# Patient Record
Sex: Female | Born: 1945
Health system: Southern US, Community
[De-identification: ages and names within clinical notes are randomized; demographics above are authoritative.]

## PROBLEM LIST (undated history)

## (undated) DIAGNOSIS — Z9682 Presence of neurostimulator: Secondary | ICD-10-CM

## (undated) DIAGNOSIS — M810 Age-related osteoporosis without current pathological fracture: Secondary | ICD-10-CM

## (undated) DIAGNOSIS — G8922 Chronic post-thoracotomy pain: Secondary | ICD-10-CM

## (undated) DIAGNOSIS — F319 Bipolar disorder, unspecified: Secondary | ICD-10-CM

## (undated) DIAGNOSIS — K219 Gastro-esophageal reflux disease without esophagitis: Secondary | ICD-10-CM

## (undated) DIAGNOSIS — M549 Dorsalgia, unspecified: Secondary | ICD-10-CM

## (undated) DIAGNOSIS — I959 Hypotension, unspecified: Secondary | ICD-10-CM

## (undated) DIAGNOSIS — N189 Chronic kidney disease, unspecified: Secondary | ICD-10-CM

## (undated) DIAGNOSIS — G2581 Restless legs syndrome: Secondary | ICD-10-CM

## (undated) DIAGNOSIS — J069 Acute upper respiratory infection, unspecified: Secondary | ICD-10-CM

## (undated) DIAGNOSIS — E039 Hypothyroidism, unspecified: Secondary | ICD-10-CM

## (undated) DIAGNOSIS — L509 Urticaria, unspecified: Secondary | ICD-10-CM

## (undated) DIAGNOSIS — F1110 Opioid abuse, uncomplicated: Secondary | ICD-10-CM

## (undated) DIAGNOSIS — C3490 Malignant neoplasm of unspecified part of unspecified bronchus or lung: Secondary | ICD-10-CM

## (undated) DIAGNOSIS — G43909 Migraine, unspecified, not intractable, without status migrainosus: Secondary | ICD-10-CM

## (undated) DIAGNOSIS — N3941 Urge incontinence: Secondary | ICD-10-CM

## (undated) DIAGNOSIS — R6510 Systemic inflammatory response syndrome (SIRS) of non-infectious origin without acute organ dysfunction: Secondary | ICD-10-CM

## (undated) DIAGNOSIS — H269 Unspecified cataract: Secondary | ICD-10-CM

## (undated) DIAGNOSIS — A0472 Enterocolitis due to Clostridium difficile, not specified as recurrent: Secondary | ICD-10-CM

## (undated) DIAGNOSIS — G8929 Other chronic pain: Secondary | ICD-10-CM

## (undated) DIAGNOSIS — D803 Selective deficiency of immunoglobulin G [IgG] subclasses: Secondary | ICD-10-CM

## (undated) DIAGNOSIS — R251 Tremor, unspecified: Secondary | ICD-10-CM

## (undated) DIAGNOSIS — G039 Meningitis, unspecified: Secondary | ICD-10-CM

## (undated) DIAGNOSIS — D801 Nonfamilial hypogammaglobulinemia: Secondary | ICD-10-CM

## (undated) DIAGNOSIS — T7840XA Allergy, unspecified, initial encounter: Secondary | ICD-10-CM

## (undated) DIAGNOSIS — G4730 Sleep apnea, unspecified: Secondary | ICD-10-CM

## (undated) DIAGNOSIS — D649 Anemia, unspecified: Secondary | ICD-10-CM

## (undated) DIAGNOSIS — R413 Other amnesia: Secondary | ICD-10-CM

## (undated) DIAGNOSIS — Z5189 Encounter for other specified aftercare: Secondary | ICD-10-CM

## (undated) HISTORY — DX: Presence of neurostimulator: Z96.82

## (undated) HISTORY — DX: Hypotension, unspecified: I95.9

## (undated) HISTORY — DX: Systemic inflammatory response syndrome (sirs) of non-infectious origin without acute organ dysfunction: R65.10

## (undated) HISTORY — DX: Meningitis, unspecified: G03.9

## (undated) HISTORY — DX: Acute upper respiratory infection, unspecified: J06.9

## (undated) HISTORY — PX: SPINAL CORD STIMULATOR IMPLANT: SHX2422

## (undated) HISTORY — DX: Selective deficiency of immunoglobulin g (igg) subclasses: D80.3

## (undated) HISTORY — DX: Anemia, unspecified: D64.9

## (undated) HISTORY — DX: Enterocolitis due to Clostridium difficile, not specified as recurrent: A04.72

## (undated) HISTORY — PX: CARPAL TUNNEL RELEASE: SHX101

## (undated) HISTORY — DX: Unspecified cataract: H26.9

## (undated) HISTORY — PX: CHOLECYSTECTOMY: SHX55

## (undated) HISTORY — DX: Age-related osteoporosis without current pathological fracture: M81.0

## (undated) HISTORY — DX: Encounter for other specified aftercare: Z51.89

## (undated) HISTORY — DX: Urticaria, unspecified: L50.9

## (undated) HISTORY — PX: ABDOMINAL HYSTERECTOMY: SHX81

## (undated) HISTORY — DX: Allergy, unspecified, initial encounter: T78.40XA

## (undated) HISTORY — DX: Opioid abuse, uncomplicated: F11.10

## (undated) HISTORY — PX: MULTIPLE TOOTH EXTRACTIONS: SHX2053

## (undated) HISTORY — PX: GASTRIC BYPASS: SHX52

---

## 2010-01-24 ENCOUNTER — Ambulatory Visit: Payer: Self-pay | Admitting: Internal Medicine

## 2010-01-25 ENCOUNTER — Inpatient Hospital Stay (HOSPITAL_COMMUNITY): Admission: EM | Admit: 2010-01-25 | Discharge: 2010-01-28 | Payer: Self-pay | Admitting: Orthopedic Surgery

## 2010-01-26 ENCOUNTER — Encounter: Payer: Self-pay | Admitting: Internal Medicine

## 2010-01-26 ENCOUNTER — Encounter (INDEPENDENT_AMBULATORY_CARE_PROVIDER_SITE_OTHER): Payer: Self-pay | Admitting: Cardiovascular Disease

## 2010-01-26 ENCOUNTER — Encounter (INDEPENDENT_AMBULATORY_CARE_PROVIDER_SITE_OTHER): Payer: Self-pay | Admitting: Internal Medicine

## 2010-01-28 ENCOUNTER — Encounter (INDEPENDENT_AMBULATORY_CARE_PROVIDER_SITE_OTHER): Payer: Self-pay | Admitting: Internal Medicine

## 2010-01-28 ENCOUNTER — Encounter: Payer: Self-pay | Admitting: Pulmonary Disease

## 2010-01-31 ENCOUNTER — Ambulatory Visit: Payer: Self-pay | Admitting: Internal Medicine

## 2010-01-31 DIAGNOSIS — D803 Selective deficiency of immunoglobulin G [IgG] subclasses: Secondary | ICD-10-CM | POA: Insufficient documentation

## 2010-01-31 DIAGNOSIS — G039 Meningitis, unspecified: Secondary | ICD-10-CM | POA: Insufficient documentation

## 2010-01-31 DIAGNOSIS — C3490 Malignant neoplasm of unspecified part of unspecified bronchus or lung: Secondary | ICD-10-CM | POA: Insufficient documentation

## 2010-01-31 DIAGNOSIS — G4733 Obstructive sleep apnea (adult) (pediatric): Secondary | ICD-10-CM | POA: Insufficient documentation

## 2010-01-31 DIAGNOSIS — J310 Chronic rhinitis: Secondary | ICD-10-CM | POA: Insufficient documentation

## 2010-02-03 ENCOUNTER — Telehealth: Payer: Self-pay | Admitting: Internal Medicine

## 2010-02-04 ENCOUNTER — Telehealth: Payer: Self-pay | Admitting: Internal Medicine

## 2010-02-05 ENCOUNTER — Telehealth: Payer: Self-pay | Admitting: Internal Medicine

## 2010-02-11 ENCOUNTER — Encounter: Payer: Self-pay | Admitting: Internal Medicine

## 2010-02-12 ENCOUNTER — Encounter: Payer: Self-pay | Admitting: Internal Medicine

## 2010-02-12 DIAGNOSIS — E039 Hypothyroidism, unspecified: Secondary | ICD-10-CM | POA: Insufficient documentation

## 2010-02-14 HISTORY — PX: LUNG CANCER SURGERY: SHX702

## 2010-03-03 ENCOUNTER — Encounter: Payer: Self-pay | Admitting: Internal Medicine

## 2010-04-08 ENCOUNTER — Encounter: Payer: Self-pay | Admitting: Internal Medicine

## 2010-06-22 DIAGNOSIS — F332 Major depressive disorder, recurrent severe without psychotic features: Secondary | ICD-10-CM | POA: Insufficient documentation

## 2010-06-22 DIAGNOSIS — D801 Nonfamilial hypogammaglobulinemia: Secondary | ICD-10-CM | POA: Insufficient documentation

## 2010-09-07 ENCOUNTER — Encounter: Payer: Self-pay | Admitting: Internal Medicine

## 2010-09-16 NOTE — Consult Note (Signed)
Summary: [facility]  [place]   Imported By: Lester [place] 02/04/2010 10:16:45  _____________________________________________________________________  External Attachment:    Type:   Image     Comment:   External Document

## 2010-09-16 NOTE — Progress Notes (Signed)
Summary: speak to [facility]  Phone Note Call from Patient Call back at [facility] Phone [phone]   Caller: Patient Call For: Rosemae Mcquown Summary of Call: Wants to speak to Bjorn Loser, re: oncology referral. Initial call taken by: Darletta Moll,  February 03, 2010 8:22 AM  Follow-up for Phone Call        Bjorn Loser are you expecting a call from this pt? Zackery Barefoot CMA  February 03, 2010 9:09 AM  Tried calling pt back. Pt's phone would not accept unidentified callers, gave my name when phone started ringing it went to a busy signial. Alfonso Ramus  February 03, 2010 10:12 AM   Return pt's call and she stated that a former classmate's father(of pt's son)  is a physician at [facility] and he has recommended Dr. Rexene Alberts. Pt is requesting that Dr. Maple Hudson call a Dr. Billee Cashing [phone] and speak with him about accepting Mrs. Pang as patient.  Patient would like a follow up call from the above message. Alfonso Ramus  February 03, 2010 10:51 AM   Follow-up by: Waymon Budge MD,  February 03, 2010 1:23 PM  Additional Follow-up for Phone Call Additional follow up Details #1::        Please try to set up this referral. Additional Follow-up by: Waymon Budge MD,  February 03, 2010 1:23 PM    Additional Follow-up for Phone Call Additional follow up Details #2::    Called Levonne Hubert at [facility]. Asked her to please schedule Mrs. Shadduck with Dr. Rexene Alberts. She stated that she would be happy to schedule pt with him. Oneita Kras that Path slides and CT's Chest have been sent fed ex per her request and records faxed this am. Rodney Booze stated that she would have appt in a couple of days and would call me with appt. Called and spoke with Mrs. Mccalister and advised of the above. Instructed pt that I would be back in touch with her an appt. Rhonda Cobb  February 03, 2010 4:20 PM

## 2010-09-16 NOTE — Progress Notes (Signed)
----   Converted from flag ---- ---- 02/04/2010 2:26 PM, Alfonso Ramus wrote: Pt is scheduled to see Dr. Billee Cashing on Tues. 02/11/10 at 2:30. Pt is aware of appt.  thanks, Bjorn Loser ------------------------------

## 2010-09-16 NOTE — Progress Notes (Signed)
Summary: [facility] requesting PET before appt  Phone Note From Other Clinic Call back at [phone]   Caller: Levonne Hubert with [facility] Call For: Dr. Maple Hudson Summary of Call: [phone] Rodney Booze with [facility] is requesting our physician to order PET Scan. Their physician Dr. Billee Cashing can see the patient on Tues 02/11/10 but would like to have the PET Scan already done here.  May I have an order for a PET Scan? Please advise.  Initial call taken by: Alfonso Ramus,  February 04, 2010 2:14 PM  Follow-up for Phone Call        Rodney Booze with [facility] called back and stated that the pt didn't want to have the PET scan here, because she lives 3 hours away from here. Rodney Booze stated that they would schedule PET and have pt come down on Monday at [facility] to obtain PET. Rhonda Cobb  February 04, 2010 2:25 PM

## 2010-09-16 NOTE — Letter (Signed)
Summary: [facility]   Imported By: Sherian Rein 03/07/2010 10:28:34  _____________________________________________________________________  External Attachment:    Type:   Image     Comment:   External Document

## 2010-09-16 NOTE — Progress Notes (Signed)
Summary: diagnosis  Phone Note Call from Patient Call back at Home Phone [phone]   Caller: Patient Call For: Megan Brewer Reason for Call: Talk to Nurse Summary of Call: pt needs a copy of her diagnosis to send to her insurance company. Initial call taken by: Eugene Gavia,  February 03, 2010 8:35 AM  Follow-up for Phone Call        Spoke with pt.  She states that she is needing a letter stating her dx from [facility].  Once letter is completed, she needs this mailed to her.  Please advise, thanks! Follow-up by: [name],  February 03, 2010 12:32 PM  Additional Follow-up for Phone Call Additional follow up Details #1::        Dictated Additional Follow-up by: Waymon Budge MD,  February 03, 2010 1:22 PM     Appended Document: diagnosis Await dictation to be completed  Appended Document: diagnosis KATIE PLS HOLD FOR LETTER HAVE CY SIGN AND MAIL TO PT  Appended Document: diagnosis Letter has been signed and mailed to pt.

## 2010-09-16 NOTE — Assessment & Plan Note (Signed)
Summary: f/u biopsy per Dr. Rodney Langton   CC:  f/u biopsy per Dr. Sung Amabile.  History of Present Illness: January 31, 2010- 65 yoF never smoker recently hosp with a Right hilar Mass. Dr Sung Amabile did bronch finding adenocarcinoma. She is here to f/u now with husband and daughter. Atypical chest pain was evaluated with negative stress Cardiolite  test done because of elevated Troponin. My consult note from that hospitalization, the DC summary and bronchoscopy note are reviewed. She has not yet had staging. They live in [place] and she has been hosp twice in past year at [facility] for Migraine, so she has a file there. We discussed how best to proceed and I suggested the transportation and logistics would be better if she were managed through [facility].   Preventive Screening-Counseling & Management  Alcohol-Tobacco     Smoking Status: never  Past History:  Past Medical History: Adenocarcinoma lung-                              > bronch dx 01/28/10 IgG4 deficiency                             IV gammaglobulin through L portacath every 3 weeks Arachnoiditis- neurostimulator Osteoporosis Hx pseudomembranous colitis Rhinosinusitis Migraine Anxiety TMJ discomfort- bite guard fibromyalgia  Past Surgical History: Gastric bypass Neural stimulator for arachnoiditis Left portacath carpal tunnel  Morton's neuroma Knee arthroscopy Ankle arthroscopy Sinus surgery tubal ligation Partial hysterectomy angioplasty Cholecystectomy  Family History: empysemia.....Marland KitchenCHF.Marland KitchenMarland KitchenMarland KitchenHX of TIA....+..grandmother melanoma.......+ mother and father Father- died Parkinsons, MI  Social History: married lives with husband, Geneticist, molecular, has 2 children. Patient never smoked.  Smoking Status:  never  Review of Systems       The patient complains of shortness of breath with activity, chest pain, irregular heartbeats, acid heartburn, indigestion, weight change, sore throat, tooth/dental problems, headaches, nasal  congestion/difficulty breathing through nose, ear ache, anxiety, depression, hand/feet swelling, and change in color of mucus.  The patient denies shortness of breath at rest, productive cough, non-productive cough, coughing up blood, loss of appetite, abdominal pain, difficulty swallowing, itching, joint stiffness or pain, rash, and fever.    Vital Signs:  Patient profile:   65 year old female Height:      63 inches Weight:      150 pounds BMI:     26.67 O2 Sat:      97 % on Room air Pulse rate:   99 / minute BP sitting:   98 / 50  (left arm) Cuff size:   regular  Vitals Entered By: Denna Haggard, CMA (January 31, 2010 2:42 PM)  O2 Sat at Rest %:  97% O2 Flow:  Room air  Reason for Visit F/U biopsy per Dr. Sung Amabile  Physical Exam  Additional Exam:  General: A/Ox3; pleasant and cooperative, NAD, SKIN: no rash, lesions NODES: no lymphadenopathy HEENT: /AT, EOM- WNL, Conjuctivae- clear, PERRLA, TM-WNL, Nose- clear, Throat- clear and wnl NECK: Supple w/ fair ROM, JVD- none, normal carotid impulses w/o bruits Thyroid- normal to palpation CHEST: Clear to P&A, no wheeze, cough or dullness HEART: RRR, no m/g/r heard ABDOMEN: Soft and nl; nml bowel sounds; no organomegaly or masses noted NWG:NFAO, nl pulses, no edema  NEURO: Grossly intact to observation       Impression & Recommendations:  Problem # 1:  ADENOCARCINOMA, RIGHT LUNG (ICD-162.9) I have discussed the  diagnosis and what information we have at this point with her and her family. She lives in [place] and has had other medical care at [facility] so we will explore early availability of oncology there. If it will take too long we will get her seen here for staging and reconsider later.. I anticipate she will need PFT, PET- not yet done.  Medications Added to Medication List This Visit: 1)  Ativan 2 Mg Tabs (Lorazepam)  Other Orders: Est. Patient Level IV (14782) Oncology Referral (Oncology)  Patient  Instructions: 1)  Please schedule a follow-up appointment as needed. 2)  See  to set up an oncology referral to [facility]  Appended Document: f/u biopsy per Dr. Rodney Langton    Clinical Lists Changes  Medications: Added new medication of ASPIRIN 81 MG TBEC (ASPIRIN) take 1 by mouth once daily Added new medication of ZOLOFT 100 MG TABS (SERTRALINE HCL) take 1.5 by mouth once daily Added new medication of GEODON 60 MG CAPS (ZIPRASIDONE HCL) take 1 by mouth two times a day Added new medication of LEVOTHYROXINE SODIUM 100 MCG TABS (LEVOTHYROXINE SODIUM) take 1 by mouth once daily Added new medication of MOBIC 7.5 MG TABS (MELOXICAM) take 1 by mouth once daily as needed Added new medication of BUTISOL SODIUM 30 MG TABS (BUTABARBITAL SODIUM) take 1 by mouth once daily as needed (up to 2.5 once daily) Added new medication of VITAMIN D 1000 UNIT TABS (CHOLECALCIFEROL) take 1 by mouth once daily Added new medication of POTASSIUM CHLORIDE CR 10 MEQ CR-TABS (POTASSIUM CHLORIDE) take 1 by mouth once daily Added new medication of FERRETTS 325 (106 FE) MG TABS (FERROUS FUMARATE) take 1 by mouth once daily Added new medication of LORATADINE 10 MG TABS (LORATADINE) take 1 by mouth once daily Added new medication of BENADRYL 25 MG TABS (DIPHENHYDRAMINE HCL) take 2 by mouth at bedtime Added new medication of TORSEMIDE 5 MG TABS (TORSEMIDE) take 1 by mouth once daily Added new medication of VICODIN 5-500 MG TABS (HYDROCODONE-ACETAMINOPHEN) take 1 by mouth every 4-6 hours as needed pain or as directed Added new medication of CYANOCOBALAMIN 1000 MCG/ML SOLN (CYANOCOBALAMIN) Injection monthly Added new medication of VITAMIN B-12 250 MCG TABS (CYANOCOBALAMIN) take 1 by mouth once daily

## 2010-09-16 NOTE — Letter (Signed)
Summary: [facility]   Imported By: Sherian Rein 04/08/2010 11:15:57  _____________________________________________________________________  External Attachment:    Type:   Image     Comment:   External Document

## 2010-09-16 NOTE — Letter (Signed)
Summary: [facility]   Imported By: Sherian Rein 05/23/2010 14:14:08  _____________________________________________________________________  External Attachment:    Type:   Image     Comment:   External Document

## 2010-09-16 NOTE — Letter (Signed)
Summary: [facility]   Imported By: Sherian Rein 03/07/2010 10:27:23  _____________________________________________________________________  External Attachment:    Type:   Image     Comment:   External Document

## 2010-11-03 LAB — DIFFERENTIAL
Basophils Absolute: 0 10*3/uL (ref 0.0–0.1)
Basophils Absolute: 0 10*3/uL (ref 0.0–0.1)
Basophils Absolute: 0.1 10*3/uL (ref 0.0–0.1)
Basophils Relative: 0 % (ref 0–1)
Basophils Relative: 1 % (ref 0–1)
Basophils Relative: 1 % (ref 0–1)
Eosinophils Absolute: 0 10*3/uL (ref 0.0–0.7)
Eosinophils Absolute: 0.1 10*3/uL (ref 0.0–0.7)
Eosinophils Absolute: 0.1 10*3/uL (ref 0.0–0.7)
Eosinophils Relative: 1 % (ref 0–5)
Eosinophils Relative: 1 % (ref 0–5)
Eosinophils Relative: 2 % (ref 0–5)
Lymphocytes Relative: 24 % (ref 12–46)
Lymphocytes Relative: 25 % (ref 12–46)
Lymphocytes Relative: 31 % (ref 12–46)
Lymphs Abs: 0.9 10*3/uL (ref 0.7–4.0)
Lymphs Abs: 1.2 10*3/uL (ref 0.7–4.0)
Lymphs Abs: 1.3 10*3/uL (ref 0.7–4.0)
Monocytes Absolute: 0.3 10*3/uL (ref 0.1–1.0)
Monocytes Absolute: 0.4 10*3/uL (ref 0.1–1.0)
Monocytes Absolute: 0.4 10*3/uL (ref 0.1–1.0)
Monocytes Relative: 7 % (ref 3–12)
Monocytes Relative: 8 % (ref 3–12)
Monocytes Relative: 9 % (ref 3–12)
Neutro Abs: 1.7 10*3/uL (ref 1.7–7.7)
Neutro Abs: 3.3 10*3/uL (ref 1.7–7.7)
Neutro Abs: 3.6 10*3/uL (ref 1.7–7.7)
Neutrophils Relative %: 58 % (ref 43–77)
Neutrophils Relative %: 65 % (ref 43–77)
Neutrophils Relative %: 67 % (ref 43–77)

## 2010-11-03 LAB — FUNGUS CULTURE W SMEAR: Fungal Smear: NONE SEEN

## 2010-11-03 LAB — CBC
HCT: 32 % — ABNORMAL LOW (ref 36.0–46.0)
HCT: 32.9 % — ABNORMAL LOW (ref 36.0–46.0)
HCT: 35.1 % — ABNORMAL LOW (ref 36.0–46.0)
HCT: 36.9 % (ref 36.0–46.0)
HCT: 38.7 % (ref 36.0–46.0)
Hemoglobin: 11.1 g/dL — ABNORMAL LOW (ref 12.0–15.0)
Hemoglobin: 11.4 g/dL — ABNORMAL LOW (ref 12.0–15.0)
Hemoglobin: 12.2 g/dL (ref 12.0–15.0)
Hemoglobin: 12.5 g/dL (ref 12.0–15.0)
Hemoglobin: 13.3 g/dL (ref 12.0–15.0)
MCHC: 34 g/dL (ref 30.0–36.0)
MCHC: 34.3 g/dL (ref 30.0–36.0)
MCHC: 34.7 g/dL (ref 30.0–36.0)
MCHC: 34.8 g/dL (ref 30.0–36.0)
MCHC: 34.8 g/dL (ref 30.0–36.0)
MCV: 89.9 fL (ref 78.0–100.0)
MCV: 90.1 fL (ref 78.0–100.0)
MCV: 90.3 fL (ref 78.0–100.0)
MCV: 90.5 fL (ref 78.0–100.0)
MCV: 91.3 fL (ref 78.0–100.0)
Platelets: 126 10*3/uL — ABNORMAL LOW (ref 150–400)
Platelets: 141 10*3/uL — ABNORMAL LOW (ref 150–400)
Platelets: 144 10*3/uL — ABNORMAL LOW (ref 150–400)
Platelets: 94 10*3/uL — ABNORMAL LOW (ref 150–400)
Platelets: 95 10*3/uL — ABNORMAL LOW (ref 150–400)
RBC: 3.55 MIL/uL — ABNORMAL LOW (ref 3.87–5.11)
RBC: 3.63 MIL/uL — ABNORMAL LOW (ref 3.87–5.11)
RBC: 3.9 MIL/uL (ref 3.87–5.11)
RBC: 4.04 MIL/uL (ref 3.87–5.11)
RBC: 4.3 MIL/uL (ref 3.87–5.11)
RDW: 12.3 % (ref 11.5–15.5)
RDW: 12.5 % (ref 11.5–15.5)
RDW: 12.6 % (ref 11.5–15.5)
RDW: 12.7 % (ref 11.5–15.5)
RDW: 12.8 % (ref 11.5–15.5)
WBC: 2.9 10*3/uL — ABNORMAL LOW (ref 4.0–10.5)
WBC: 3.3 10*3/uL — ABNORMAL LOW (ref 4.0–10.5)
WBC: 4.8 10*3/uL (ref 4.0–10.5)
WBC: 5 10*3/uL (ref 4.0–10.5)
WBC: 5.4 10*3/uL (ref 4.0–10.5)

## 2010-11-03 LAB — BASIC METABOLIC PANEL
BUN: 10 mg/dL (ref 6–23)
BUN: 13 mg/dL (ref 6–23)
BUN: 14 mg/dL (ref 6–23)
BUN: 18 mg/dL (ref 6–23)
CO2: 26 mEq/L (ref 19–32)
CO2: 26 mEq/L (ref 19–32)
CO2: 27 mEq/L (ref 19–32)
CO2: 29 mEq/L (ref 19–32)
Calcium: 7.7 mg/dL — ABNORMAL LOW (ref 8.4–10.5)
Calcium: 7.8 mg/dL — ABNORMAL LOW (ref 8.4–10.5)
Calcium: 8 mg/dL — ABNORMAL LOW (ref 8.4–10.5)
Calcium: 9.2 mg/dL (ref 8.4–10.5)
Chloride: 102 mEq/L (ref 96–112)
Chloride: 108 mEq/L (ref 96–112)
Chloride: 109 mEq/L (ref 96–112)
Chloride: 109 mEq/L (ref 96–112)
Creatinine, Ser: 0.63 mg/dL (ref 0.4–1.2)
Creatinine, Ser: 0.64 mg/dL (ref 0.4–1.2)
Creatinine, Ser: 0.71 mg/dL (ref 0.4–1.2)
Creatinine, Ser: 0.9 mg/dL (ref 0.4–1.2)
GFR calc Af Amer: >60 mL/min (ref >60)
GFR calc Af Amer: >60 mL/min (ref >60)
GFR calc Af Amer: >60 mL/min (ref >60)
GFR calc Af Amer: >60 mL/min (ref >60)
GFR calc non Af Amer: >60 mL/min (ref >60)
GFR calc non Af Amer: >60 mL/min (ref >60)
GFR calc non Af Amer: >60 mL/min (ref >60)
GFR calc non Af Amer: >60 mL/min (ref >60)
Glucose, Bld: 101 mg/dL — ABNORMAL HIGH (ref 70–99)
Glucose, Bld: 103 mg/dL — ABNORMAL HIGH (ref 70–99)
Glucose, Bld: 92 mg/dL (ref 70–99)
Glucose, Bld: 97 mg/dL (ref 70–99)
Potassium: 2.5 mEq/L — CL (ref 3.5–5.1)
Potassium: 3.4 mEq/L — ABNORMAL LOW (ref 3.5–5.1)
Potassium: 3.7 mEq/L (ref 3.5–5.1)
Potassium: 3.8 mEq/L (ref 3.5–5.1)
Sodium: 138 mEq/L (ref 135–145)
Sodium: 138 mEq/L (ref 135–145)
Sodium: 139 mEq/L (ref 135–145)
Sodium: 139 mEq/L (ref 135–145)

## 2010-11-03 LAB — LIPID PANEL
Cholesterol: 143 mg/dL (ref 0–200)
Cholesterol: 144 mg/dL (ref 0–200)
Cholesterol: 152 mg/dL (ref 0–200)
HDL: 57 mg/dL (ref >39)
HDL: 58 mg/dL (ref >39)
HDL: 62 mg/dL (ref >39)
LDL Cholesterol: 71 mg/dL (ref 0–99)
LDL Cholesterol: 72 mg/dL (ref 0–99)
LDL Cholesterol: 72 mg/dL (ref 0–99)
Total CHOL/HDL Ratio: 2.5 RATIO
Total CHOL/HDL Ratio: 2.5 RATIO
Total CHOL/HDL Ratio: 2.5 RATIO
Triglycerides: 68 mg/dL (ref <150)
Triglycerides: 73 mg/dL (ref <150)
Triglycerides: 90 mg/dL (ref <150)
VLDL: 14 mg/dL (ref 0–40)
VLDL: 15 mg/dL (ref 0–40)
VLDL: 18 mg/dL (ref 0–40)

## 2010-11-03 LAB — COMPREHENSIVE METABOLIC PANEL
ALT: 35 U/L (ref 0–35)
AST: 43 U/L — ABNORMAL HIGH (ref 0–37)
Albumin: 3.3 g/dL — ABNORMAL LOW (ref 3.5–5.2)
Alkaline Phosphatase: 80 U/L (ref 39–117)
BUN: 16 mg/dL (ref 6–23)
CO2: 27 mEq/L (ref 19–32)
Calcium: 8.2 mg/dL — ABNORMAL LOW (ref 8.4–10.5)
Chloride: 111 mEq/L (ref 96–112)
Creatinine, Ser: 0.76 mg/dL (ref 0.4–1.2)
GFR calc Af Amer: >60 mL/min (ref >60)
GFR calc non Af Amer: >60 mL/min (ref >60)
Glucose, Bld: 101 mg/dL — ABNORMAL HIGH (ref 70–99)
Potassium: 4 mEq/L (ref 3.5–5.1)
Sodium: 141 mEq/L (ref 135–145)
Total Bilirubin: 0.3 mg/dL (ref 0.3–1.2)
Total Protein: 6.3 g/dL (ref 6.0–8.3)

## 2010-11-03 LAB — AFB CULTURE WITH SMEAR (NOT AT ARMC): Acid Fast Smear: NONE SEEN

## 2010-11-03 LAB — PROTIME-INR
INR: 1.08 (ref 0.00–1.49)
Prothrombin Time: 13.9 seconds (ref 11.6–15.2)

## 2010-11-03 LAB — POCT CARDIAC MARKERS
CKMB, poc: 1.2 ng/mL (ref 1.0–8.0)
CKMB, poc: <1 ng/mL — ABNORMAL LOW (ref 1.0–8.0)
Myoglobin, poc: 113 ng/mL (ref 12–200)
Myoglobin, poc: 60.1 ng/mL (ref 12–200)
Troponin i, poc: <0.05 ng/mL (ref 0.00–0.09)
Troponin i, poc: <0.05 ng/mL (ref 0.00–0.09)

## 2010-11-03 LAB — APTT: aPTT: 30 seconds (ref 24–37)

## 2010-11-03 LAB — CARDIAC PANEL(CRET KIN+CKTOT+MB+TROPI)
CK, MB: 1.5 ng/mL (ref 0.3–4.0)
CK, MB: 1.9 ng/mL (ref 0.3–4.0)
CK, MB: 2.4 ng/mL (ref 0.3–4.0)
CK, MB: 2.7 ng/mL (ref 0.3–4.0)
Relative Index: 2.4 (ref 0.0–2.5)
Relative Index: 2.5 (ref 0.0–2.5)
Relative Index: INVALID (ref 0.0–2.5)
Relative Index: INVALID (ref 0.0–2.5)
Total CK: 101 U/L (ref 7–177)
Total CK: 109 U/L (ref 7–177)
Total CK: 85 U/L (ref 7–177)
Total CK: 99 U/L (ref 7–177)
Troponin I: 0.01 ng/mL (ref 0.00–0.06)
Troponin I: 0.07 ng/mL — ABNORMAL HIGH (ref 0.00–0.06)
Troponin I: 0.1 ng/mL — ABNORMAL HIGH (ref 0.00–0.06)
Troponin I: 0.28 ng/mL — ABNORMAL HIGH (ref 0.00–0.06)

## 2010-11-03 LAB — PHOSPHORUS: Phosphorus: 3 mg/dL (ref 2.3–4.6)

## 2010-11-03 LAB — MAGNESIUM
Magnesium: 2 mg/dL (ref 1.5–2.5)
Magnesium: 2 mg/dL (ref 1.5–2.5)
Magnesium: 2.1 mg/dL (ref 1.5–2.5)

## 2010-11-03 LAB — GLUCOSE, CAPILLARY
Glucose-Capillary: 111 mg/dL — ABNORMAL HIGH (ref 70–99)
Glucose-Capillary: 97 mg/dL (ref 70–99)

## 2010-11-03 LAB — D-DIMER, QUANTITATIVE (NOT AT ARMC): D-Dimer, Quant: 1.54 ug/mL-FEU — ABNORMAL HIGH (ref 0.00–0.48)

## 2010-11-03 LAB — CK TOTAL AND CKMB (NOT AT ARMC)
CK, MB: 1.4 ng/mL (ref 0.3–4.0)
Relative Index: INVALID (ref 0.0–2.5)
Total CK: 78 U/L (ref 7–177)

## 2010-11-03 LAB — TROPONIN I: Troponin I: <0.01 ng/mL (ref 0.00–0.06)

## 2010-11-03 LAB — BRAIN NATRIURETIC PEPTIDE: Pro B Natriuretic peptide (BNP): 31 pg/mL (ref 0.0–100.0)

## 2010-12-30 NOTE — Letter (Signed)
January 31, 2010     RE:  RAYLEIGH, GILLYARD  MRN:  [id number]  /  DOB:  [birth year]   To Whom It May Concern:   Ms. Gronewold is under my medical care.  She is a never smoker diagnosed with  adenocarcinoma of the lung based on bronchoscopy done January 28, 2010.  A  copy of the pathology report is enclosed.    Sincerely,      Clinton D. Maple Hudson, MD, Tonny Bollman, FACP  Electronically Signed    [facility]  DD: 02/03/2010  DT: 02/04/2010  Job #: [id number]

## 2011-05-28 DIAGNOSIS — N3946 Mixed incontinence: Secondary | ICD-10-CM | POA: Insufficient documentation

## 2011-06-10 DIAGNOSIS — G44209 Tension-type headache, unspecified, not intractable: Secondary | ICD-10-CM | POA: Insufficient documentation

## 2011-10-28 DIAGNOSIS — N3941 Urge incontinence: Secondary | ICD-10-CM | POA: Insufficient documentation

## 2012-01-14 DIAGNOSIS — R339 Retention of urine, unspecified: Secondary | ICD-10-CM | POA: Insufficient documentation

## 2012-04-01 DIAGNOSIS — G250 Essential tremor: Secondary | ICD-10-CM | POA: Insufficient documentation

## 2012-04-01 DIAGNOSIS — R413 Other amnesia: Secondary | ICD-10-CM | POA: Insufficient documentation

## 2012-05-23 DIAGNOSIS — N318 Other neuromuscular dysfunction of bladder: Secondary | ICD-10-CM | POA: Insufficient documentation

## 2012-07-07 DIAGNOSIS — G2581 Restless legs syndrome: Secondary | ICD-10-CM | POA: Insufficient documentation

## 2012-09-20 DIAGNOSIS — G479 Sleep disorder, unspecified: Secondary | ICD-10-CM | POA: Insufficient documentation

## 2013-08-17 HISTORY — PX: COLONOSCOPY: SHX174

## 2014-02-01 LAB — HM COLONOSCOPY

## 2014-04-09 DIAGNOSIS — N189 Chronic kidney disease, unspecified: Secondary | ICD-10-CM | POA: Insufficient documentation

## 2014-04-09 DIAGNOSIS — G8922 Chronic post-thoracotomy pain: Secondary | ICD-10-CM | POA: Insufficient documentation

## 2016-08-17 HISTORY — PX: CT LUNG SCREENING: HXRAD848

## 2016-08-17 HISTORY — PX: DG  BONE DENSITY (ARMC HX): HXRAD1102

## 2016-11-26 DIAGNOSIS — H2510 Age-related nuclear cataract, unspecified eye: Secondary | ICD-10-CM | POA: Insufficient documentation

## 2017-02-11 DIAGNOSIS — Z79899 Other long term (current) drug therapy: Secondary | ICD-10-CM | POA: Insufficient documentation

## 2017-05-21 DIAGNOSIS — Z9884 Bariatric surgery status: Secondary | ICD-10-CM | POA: Insufficient documentation

## 2017-05-29 ENCOUNTER — Encounter (HOSPITAL_COMMUNITY): Payer: Self-pay | Admitting: Emergency Medicine

## 2017-05-29 ENCOUNTER — Emergency Department (HOSPITAL_COMMUNITY): Payer: Medicare Other

## 2017-05-29 ENCOUNTER — Emergency Department (HOSPITAL_COMMUNITY)
Admission: EM | Admit: 2017-05-29 | Discharge: 2017-05-30 | Disposition: A | Discharge status: Home / Self Care | Payer: Medicare Other | Attending: Emergency Medicine | Admitting: Emergency Medicine

## 2017-05-29 DIAGNOSIS — N189 Chronic kidney disease, unspecified: Secondary | ICD-10-CM | POA: Insufficient documentation

## 2017-05-29 DIAGNOSIS — Y92009 Unspecified place in unspecified non-institutional (private) residence as the place of occurrence of the external cause: Secondary | ICD-10-CM

## 2017-05-29 DIAGNOSIS — Y998 Other external cause status: Secondary | ICD-10-CM | POA: Diagnosis not present

## 2017-05-29 DIAGNOSIS — G894 Chronic pain syndrome: Secondary | ICD-10-CM | POA: Diagnosis not present

## 2017-05-29 DIAGNOSIS — W19XXXA Unspecified fall, initial encounter: Secondary | ICD-10-CM | POA: Insufficient documentation

## 2017-05-29 DIAGNOSIS — M79622 Pain in left upper arm: Secondary | ICD-10-CM | POA: Insufficient documentation

## 2017-05-29 DIAGNOSIS — Z23 Encounter for immunization: Secondary | ICD-10-CM | POA: Insufficient documentation

## 2017-05-29 DIAGNOSIS — Y93E2 Activity, laundry: Secondary | ICD-10-CM | POA: Diagnosis not present

## 2017-05-29 DIAGNOSIS — Y92003 Bedroom of unspecified non-institutional (private) residence as the place of occurrence of the external cause: Secondary | ICD-10-CM | POA: Diagnosis not present

## 2017-05-29 DIAGNOSIS — M7918 Myalgia, other site: Secondary | ICD-10-CM | POA: Diagnosis not present

## 2017-05-29 DIAGNOSIS — E039 Hypothyroidism, unspecified: Secondary | ICD-10-CM | POA: Diagnosis not present

## 2017-05-29 DIAGNOSIS — S0990XA Unspecified injury of head, initial encounter: Secondary | ICD-10-CM | POA: Diagnosis present

## 2017-05-29 DIAGNOSIS — Z969 Presence of functional implant, unspecified: Secondary | ICD-10-CM | POA: Insufficient documentation

## 2017-05-29 DIAGNOSIS — M79662 Pain in left lower leg: Secondary | ICD-10-CM | POA: Insufficient documentation

## 2017-05-29 HISTORY — DX: Malignant neoplasm of unspecified part of unspecified bronchus or lung: C34.90

## 2017-05-29 HISTORY — DX: Chronic kidney disease, unspecified: N18.9

## 2017-05-29 HISTORY — DX: Restless legs syndrome: G25.81

## 2017-05-29 HISTORY — DX: Other chronic pain: G89.29

## 2017-05-29 HISTORY — DX: Migraine, unspecified, not intractable, without status migrainosus: G43.909

## 2017-05-29 HISTORY — DX: Selective deficiency of immunoglobulin g (igg) subclasses: D80.3

## 2017-05-29 HISTORY — DX: Urge incontinence: N39.41

## 2017-05-29 HISTORY — DX: Other amnesia: R41.3

## 2017-05-29 HISTORY — DX: Nonfamilial hypogammaglobulinemia: D80.1

## 2017-05-29 HISTORY — DX: Tremor, unspecified: R25.1

## 2017-05-29 HISTORY — DX: Hypothyroidism, unspecified: E03.9

## 2017-05-29 HISTORY — DX: Bipolar disorder, unspecified: F31.9

## 2017-05-29 HISTORY — DX: Dorsalgia, unspecified: M54.9

## 2017-05-29 HISTORY — DX: Chronic post-thoracotomy pain: G89.22

## 2017-05-29 HISTORY — DX: Sleep apnea, unspecified: G47.30

## 2017-05-29 HISTORY — DX: Gastro-esophageal reflux disease without esophagitis: K21.9

## 2017-05-29 LAB — BASIC METABOLIC PANEL
Anion gap: 7 (ref 5–15)
BUN: 22 mg/dL — ABNORMAL HIGH (ref 6–20)
CO2: 29 mmol/L (ref 22–32)
Calcium: 8.2 mg/dL — ABNORMAL LOW (ref 8.9–10.3)
Chloride: 103 mmol/L (ref 101–111)
Creatinine, Ser: 1.15 mg/dL — ABNORMAL HIGH (ref 0.44–1.00)
GFR calc Af Amer: 55 mL/min — ABNORMAL LOW (ref >60)
GFR calc non Af Amer: 48 mL/min — ABNORMAL LOW (ref >60)
Glucose, Bld: 100 mg/dL — ABNORMAL HIGH (ref 65–99)
Potassium: 3.9 mmol/L (ref 3.5–5.1)
Sodium: 139 mmol/L (ref 135–145)

## 2017-05-29 LAB — CBC WITH DIFFERENTIAL/PLATELET
Basophils Absolute: 0 10*3/uL (ref 0.0–0.1)
Basophils Relative: 1 %
Eosinophils Absolute: 0.1 10*3/uL (ref 0.0–0.7)
Eosinophils Relative: 3 %
HCT: 36.7 % (ref 36.0–46.0)
Hemoglobin: 11.9 g/dL — ABNORMAL LOW (ref 12.0–15.0)
Lymphocytes Relative: 32 %
Lymphs Abs: 1.6 10*3/uL (ref 0.7–4.0)
MCH: 29.8 pg (ref 26.0–34.0)
MCHC: 32.4 g/dL (ref 30.0–36.0)
MCV: 91.8 fL (ref 78.0–100.0)
Monocytes Absolute: 0.5 10*3/uL (ref 0.1–1.0)
Monocytes Relative: 10 %
Neutro Abs: 2.8 10*3/uL (ref 1.7–7.7)
Neutrophils Relative %: 54 %
Platelets: 147 10*3/uL — ABNORMAL LOW (ref 150–400)
RBC: 4 MIL/uL (ref 3.87–5.11)
RDW: 13.5 % (ref 11.5–15.5)
WBC: 5.1 10*3/uL (ref 4.0–10.5)

## 2017-05-29 LAB — CK: Total CK: 148 U/L (ref 38–234)

## 2017-05-29 LAB — TROPONIN I: Troponin I: <0.03 ng/mL (ref <0.03)

## 2017-05-29 MED ORDER — FENTANYL CITRATE (PF) 100 MCG/2ML IJ SOLN
50.0000 ug | INTRAMUSCULAR | Status: DC | PRN
  Administered 2017-05-29: 50 ug via INTRAVENOUS
  Filled 2017-05-29 (×2): qty 2

## 2017-05-29 MED ORDER — TETANUS-DIPHTH-ACELL PERTUSSIS 5-2.5-18.5 LF-MCG/0.5 IM SUSP
0.5000 mL | Freq: Once | INTRAMUSCULAR | Status: AC
  Administered 2017-05-30: 0.5 mL via INTRAMUSCULAR
  Filled 2017-05-29: qty 0.5

## 2017-05-29 NOTE — ED Triage Notes (Signed)
Pt BIB [facility] from daughters home. Pt tripped and fell she is c/o pain in her head, left shoulder, left hip, and left wrist and left knee. She is CAOx4 and remembers the fall. She denies N/V. She laid on the floor for 1.5 hours until daughter came home and called EMS.

## 2017-05-29 NOTE — ED Notes (Signed)
EMS performed orthostatic VS and pt was negative for othostatics.

## 2017-05-29 NOTE — ED Notes (Signed)
Bed: SJ29 Expected date:  Expected time:  Means of arrival:  Comments: 71 yo fall

## 2017-05-29 NOTE — ED Provider Notes (Signed)
[facility] DEPT Provider Note   CSN: [id number] Arrival date & time: 05/29/17  1916     History   Chief Complaint Chief Complaint  Patient presents with  . Fall  . Injury    HPI Megan Brewer is a 71 y.o. female.  HPI  Pt was seen at [facility]. Per EMS and pt report, c/o sudden onset and resolution of one episode of trip and fall while changing bed linens this afternoon. Pt states she fell onto her left side and was unable to get up off the floor due to "pain all over." Pt states she laid on the floor for 1.5 hours until her family came home and called EMS. Pt c/o head injury, left UE and LE pain, and acute flair of her chronic LBP. EMS states orthostatic VS at the scene were negative.  Denies prodromal symptoms before fall, no syncope, no LOC, no AMS, no CP/palpitations, no SOB/cough, no abd pain, no N/V/D, no focal motor weakness, no tingling/numbness in extremities.    Past Medical History:  Diagnosis Date  . Bipolar disorder ([place])   . Chronic back pain   . Chronic pain   . Chronic post-thoracotomy pain   . CKD (chronic kidney disease)   . GERD (gastroesophageal reflux disease)   . Hypogammaglobulinemia ([place])   . Hypothyroid   . 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 Lung cancer ([place]) 2011, 2014  . Memory loss   . Migraine   . Restless legs   . Sleep apnea   . Tremor   . Urge incontinence     Patient Active Problem List   Diagnosis Date Noted  . ADENOCARCINOMA, RIGHT LUNG 01/31/2010  . OTHER SELECTIVE IMMUNOGLOBULIN DEFICIENCIES 01/31/2010  . ARACHNOIDITIS 01/31/2010  . OBSTRUCTIVE SLEEP APNEA 01/31/2010  . RHINOSINUSITIS, ALLERGIC, CHRONIC 01/31/2010    Past Surgical History:  Procedure Laterality Date  . ABDOMINAL HYSTERECTOMY    . CARPAL TUNNEL RELEASE    . CHOLECYSTECTOMY    . GASTRIC BYPASS    . SPINAL CORD STIMULATOR IMPLANT      OB History    No data available       Home Medications    Prior to Admission medications   Not on File     Family History No family history on file.  Social History Social History  Substance Use Topics  . Smoking status: Not on file  . Smokeless tobacco: Not on file  . Alcohol use Not on file     Allergies   Patient has no allergy information on record.   Review of Systems Review of Systems ROS: Statement: All systems negative except as marked or noted in the HPI; Constitutional: Negative for fever and chills. ; ; Eyes: Negative for eye pain, redness and discharge. ; ; ENMT: Negative for ear pain, hoarseness, nasal congestion, sinus pressure and sore throat. ; ; Cardiovascular: Negative for chest pain, palpitations, diaphoresis, dyspnea and peripheral edema. ; ; Respiratory: Negative for cough, wheezing and stridor. ; ; Gastrointestinal: Negative for nausea, vomiting, diarrhea, abdominal pain, blood in stool, hematemesis, jaundice and rectal bleeding. . ; ; Genitourinary: Negative for dysuria, flank pain and hematuria. ; ; Musculoskeletal: +head injury, LUE and LLE pain, back pain. Negative for swelling and deformity.; ; Skin: +skin tear. Negative for pruritus, rash, abrasions, blisters, bruising and skin lesion.; ; Neuro: Negative for headache, lightheadedness and neck stiffness. Negative for weakness, altered level of consciousness, altered mental status, extremity weakness, paresthesias, involuntary movement, seizure and syncope.  Physical Exam Updated Vital Signs BP (!) 121/58 (BP Location: Left Arm)   Pulse 70   Temp 97.9 F (36.6 C) (Oral)   Resp 18   Ht 5' 4.5" (1.638 m)   Wt 65.8 kg (145 lb)   SpO2 98%   BMI 24.50 kg/m   Physical Exam 1935: Physical examination:  Nursing notes reviewed; Vital signs and O2 SAT reviewed;  Constitutional: Well developed, Well nourished, Well hydrated, In no acute distress; Head:  Normocephalic, atraumatic; Eyes: EOMI, PERRL, No scleral icterus; ENMT: Mouth and pharynx normal, Mucous membranes moist; Neck: Supple, Full range of motion,  No lymphadenopathy; Cardiovascular: Regular rate and rhythm, No gallop; Respiratory: Breath sounds clear & equal bilaterally, No wheezes.  Speaking full sentences with ease, Normal respiratory effort/excursion; Chest: Nontender, Movement normal; Abdomen: Soft, Nontender, Nondistended, Normal bowel sounds; Genitourinary: No CVA tenderness; Spine:  No midline CS, TS, LS tenderness. +TTP left lumbar paraspinal muscles.;; Extremities: Pulses normal, pelvis stable. +generalized TTP left shoulder/elbow/wrist, no edema, no deformity. +generalized TTP left hip/knee/ankle, no edema, no deformity. +superficial skin tears x2 left dorsal forearm. LLE and LUE muscles compartments soft, strong peripheral pulses. +FROM left knee, including able to lift extended LLE off stretcher, and extend left lower leg against resistance.  No ligamentous laxity.  No patellar or quad tendon step-offs.  NMS intact left foot, strong pedal pp. +plantarflexion of left foot w/calf squeeze.  No palpable gap left Achilles's tendon.  No proximal fibular head tenderness.  No edema, erythema, warmth, ecchymosis or deformity.  No specific area of point tenderness. Left wrist without specific snuffbox tenderness.  No pain to axial thumb or 3rd MCP loading.  Forearm compartments soft, strong radial pp, brisk cap refill in fingers. Hand NMS intact. Left shoulder w/FROM.  TTP entire joint without specific area of point tenderness. Left clavicle NT, scapula NT, proximal humerus NT, biceps tendon NT over bicipital groove.  Motor strength at shoulder normal.  Sensation intact over deltoid region, distal NMS intact with left hand having intact and equal sensation and strength in the distribution of the median, radial, and ulnar nerve function compared to opposite side.  Strong radial pulse.  +FROM left elbow with intact motor strength biceps and triceps muscles to resistance; no specific area of point tenderness..; Neuro: AA&Ox3, Major CN grossly intact. No facial  droop. Speech clear. No gross focal motor or sensory deficits in extremities.; Skin: Color normal, Warm, Dry.; Psych:  Affect flat, poor eye contact. Entire HPI given to me with her eyes closed.     ED Treatments / Results  Labs (all labs ordered are listed, but only abnormal results are displayed)   EKG  EKG Interpretation  Date/Time:  Saturday May 29 2017 19:56:34 EDT Ventricular Rate:  69 PR Interval:    QRS Duration: 140 QT Interval:  466 QTC Calculation: 500 R Axis:   43 Text Interpretation:  Sinus rhythm Left bundle branch block Baseline wander in lead(s) I When compared with ECG of 01/28/2010 Left bundle branch block is now Present Confirmed by Francine Graven [id number]) on 05/29/2017 8:28:08 PM       Radiology   Procedures Procedures (including critical care time)  Medications Ordered in ED Medications  Tdap (BOOSTRIX) injection 0.5 mL (not administered)  fentaNYL (SUBLIMAZE) injection 50 mcg (not administered)     Initial Impression / Assessment and Plan / ED Course  I have reviewed the triage vital signs and the nursing notes.  Pertinent labs & imaging results that were available during  my care of the patient were reviewed by me and considered in my medical decision making (see chart for details).  MDM Reviewed: previous chart, nursing note and vitals Reviewed previous: labs and ECG Interpretation: labs, ECG, x-ray and CT scan   Results for orders placed or performed during the hospital encounter of 81/82/99  Basic metabolic panel  Result Value Ref Range   Sodium 139 135 - 145 mmol/L   Potassium 3.9 3.5 - 5.1 mmol/L   Chloride 103 101 - 111 mmol/L   CO2 29 22 - 32 mmol/L   Glucose, Bld 100 (H) 65 - 99 mg/dL   BUN 22 (H) 6 - 20 mg/dL   Creatinine, Ser 1.15 (H) 0.44 - 1.00 mg/dL   Calcium 8.2 (L) 8.9 - 10.3 mg/dL   GFR calc non Af Amer 48 (L) >60 mL/min   GFR calc Af Amer 55 (L) >60 mL/min   Anion gap 7 5 - 15  CBC with Differential  Result  Value Ref Range   WBC 5.1 4.0 - 10.5 K/uL   RBC 4.00 3.87 - 5.11 MIL/uL   Hemoglobin 11.9 (L) 12.0 - 15.0 g/dL   HCT 36.7 36.0 - 46.0 %   MCV 91.8 78.0 - 100.0 fL   MCH 29.8 26.0 - 34.0 pg   MCHC 32.4 30.0 - 36.0 g/dL   RDW 13.5 11.5 - 15.5 %   Platelets 147 (L) 150 - 400 K/uL   Neutrophils Relative % 54 %   Neutro Abs 2.8 1.7 - 7.7 K/uL   Lymphocytes Relative 32 %   Lymphs Abs 1.6 0.7 - 4.0 K/uL   Monocytes Relative 10 %   Monocytes Absolute 0.5 0.1 - 1.0 K/uL   Eosinophils Relative 3 %   Eosinophils Absolute 0.1 0.0 - 0.7 K/uL   Basophils Relative 1 %   Basophils Absolute 0.0 0.0 - 0.1 K/uL  CK  Result Value Ref Range   Total CK 148 38 - 234 U/L  Troponin I  Result Value Ref Range   Troponin I <0.03 <0.03 ng/mL   Dg Ribs Unilateral W/chest Right Result Date: 05/29/2017 CLINICAL DATA:  Trip and fall injury earlier today. EXAM: RIGHT RIBS AND CHEST - 3+ VIEW COMPARISON:  01/28/2010 FINDINGS: Left subclavian port appears satisfactorily positioned. Implanted stimulator leads extend up into the lower thoracic spinal canal. Right rib detail demonstrates old healed fracture deformities of the sixth and seventh ribs but no evidence of an acute fracture. The lungs are clear. No pneumothorax. No effusion. Normal hilar and mediastinal contours. Normal pulmonary vasculature. IMPRESSION: No acute findings. Electronically Signed   By: Andreas Newport M.D.   On: 05/29/2017 22:50   Dg Lumbar Spine Complete Result Date: 05/29/2017 CLINICAL DATA:  Status post fall, with lower back pain. Initial encounter. EXAM: LUMBAR SPINE - COMPLETE 4+ VIEW COMPARISON:  None. FINDINGS: There is no evidence of fracture or subluxation. Vertebral bodies demonstrate normal height and alignment. Intervertebral disc spaces are preserved. Minimal endplate sclerotic change is noted at the lower lumbar spine. The visualized neural foramina are grossly unremarkable in appearance. Metallic devices are noted at the right  lower quadrant and left lower back. The visualized bowel gas pattern is unremarkable in appearance; air and stool are noted within the colon. The sacroiliac joints are within normal limits. Clips are noted within the right upper quadrant, reflecting prior cholecystectomy. IMPRESSION: No evidence of fracture or subluxation along the lumbar spine. Electronically Signed   By: Francoise Schaumann.D.  On: 05/29/2017 22:49   Dg Elbow Complete Left Result Date: 05/29/2017 CLINICAL DATA:  Trip and fall injury earlier today. EXAM: LEFT ELBOW - COMPLETE 3+ VIEW COMPARISON:  None. FINDINGS: There is no evidence of fracture, dislocation, or joint effusion. There is no evidence of arthropathy or other focal bone abnormality. Soft tissues are unremarkable. IMPRESSION: Negative. Electronically Signed   By: Andreas Newport M.D.   On: 05/29/2017 22:51   Dg Wrist Complete Left Result Date: 05/29/2017 CLINICAL DATA:  Trip and fall injury earlier today. EXAM: LEFT WRIST - COMPLETE 3+ VIEW COMPARISON:  None. FINDINGS: There is no evidence of fracture or dislocation. There is no evidence of arthropathy or other focal bone abnormality. Soft tissues are unremarkable. IMPRESSION: Negative. Electronically Signed   By: Andreas Newport M.D.   On: 05/29/2017 22:51   Dg Ankle Complete Left Result Date: 05/29/2017 CLINICAL DATA:  Status post fall, with left ankle pain. Initial encounter. EXAM: LEFT ANKLE COMPLETE - 3+ VIEW COMPARISON:  None. FINDINGS: There is no evidence of fracture or dislocation. The ankle mortise is intact; the interosseous space is within normal limits. No talar tilt or subluxation is seen. The joint spaces are preserved. Mild soft tissue swelling is noted overlying the medial malleolus. IMPRESSION: No evidence of fracture or dislocation. Electronically Signed   By: Garald Balding M.D.   On: 05/29/2017 22:51   Dg Shoulder Left Result Date: 05/29/2017 CLINICAL DATA:  Trip and fall injury earlier today. EXAM:  LEFT SHOULDER - 2+ VIEW COMPARISON:  None. FINDINGS: There is no evidence of fracture or dislocation. There is no evidence of arthropathy or other focal bone abnormality. Soft tissues are unremarkable. IMPRESSION: Negative. Electronically Signed   By: Andreas Newport M.D.   On: 05/29/2017 22:51   Dg Knee Complete 4 Views Left Result Date: 05/29/2017 CLINICAL DATA:  Status post fall, with left knee pain. Initial encounter. EXAM: LEFT KNEE - COMPLETE 4+ VIEW COMPARISON:  None. FINDINGS: There is no evidence of fracture or dislocation. The joint spaces are preserved. No significant degenerative change is seen; the patellofemoral joint is grossly unremarkable in appearance. No significant joint effusion is seen. The visualized soft tissues are normal in appearance. IMPRESSION: No evidence of fracture or dislocation. Electronically Signed   By: Garald Balding M.D.   On: 05/29/2017 22:52   Dg Hip Unilat With Pelvis 2-3 Views Left Result Date: 05/29/2017 CLINICAL DATA:  Status post fall, with left hip pain. Initial encounter. EXAM: DG HIP (WITH OR WITHOUT PELVIS) 2-3V LEFT COMPARISON:  None. FINDINGS: There is no evidence of fracture or dislocation. Both femoral heads are seated normally within their respective acetabula. The proximal left femur appears intact. No significant degenerative change is appreciated. The sacroiliac joints are unremarkable in appearance. The visualized bowel gas pattern is grossly unremarkable in appearance. Diffuse right lower quadrant and left flank metallic devices are noted. IMPRESSION: No evidence of fracture or dislocation. Electronically Signed   By: Garald Balding M.D.   On: 05/29/2017 22:50    Ct Head Wo Contrast Result Date: 05/29/2017 CLINICAL DATA:  Status post fall, with headache. Concern for cervical spine injury. Initial encounter. EXAM: CT HEAD WITHOUT CONTRAST CT CERVICAL SPINE WITHOUT CONTRAST TECHNIQUE: Multidetector CT imaging of the head and cervical spine was  performed following the standard protocol without intravenous contrast. Multiplanar CT image reconstructions of the cervical spine were also generated. COMPARISON:  None. FINDINGS: CT HEAD FINDINGS Brain: No evidence of acute infarction, hemorrhage, hydrocephalus, extra-axial collection or  mass lesion/mass effect. The posterior fossa, including the cerebellum, brainstem and fourth ventricle, is within normal limits. The third and lateral ventricles, and basal ganglia are unremarkable in appearance. The cerebral hemispheres are symmetric in appearance, with normal gray-white differentiation. No mass effect or midline shift is seen. Vascular: No hyperdense vessel or unexpected calcification. Skull: There is no evidence of fracture; visualized osseous structures are unremarkable in appearance. Sinuses/Orbits: The orbits are within normal limits. The patient is status post resection of the ethmoid air cells. The paranasal sinuses and mastoid air cells are well-aerated. Other: No significant soft tissue abnormalities are seen. CT CERVICAL SPINE FINDINGS Alignment: Normal. Skull base and vertebrae: No acute fracture. No primary bone lesion or focal pathologic process. A small nonspecific lucency is noted at the superior aspect of vertebral body C6. Soft tissues and spinal canal: No prevertebral fluid or swelling. No visible canal hematoma. Disc levels: Intervertebral disc spaces are preserved. The bony foramina are grossly unremarkable. Upper chest: Scattered calcifications are seen within the thyroid gland. The visualized lung apices are clear. Other: No additional soft tissue abnormalities are seen. IMPRESSION: 1. No evidence of traumatic intracranial injury or fracture. 2. No evidence of fracture or subluxation along the cervical spine. 3. Small nonspecific lucency at the superior aspect of vertebral body C6. Electronically Signed   By: Garald Balding M.D.   On: 05/29/2017 23:33   Ct Cervical Spine Wo  Contrast Result Date: 05/29/2017 CLINICAL DATA:  Status post fall, with headache. Concern for cervical spine injury. Initial encounter. EXAM: CT HEAD WITHOUT CONTRAST CT CERVICAL SPINE WITHOUT CONTRAST TECHNIQUE: Multidetector CT imaging of the head and cervical spine was performed following the standard protocol without intravenous contrast. Multiplanar CT image reconstructions of the cervical spine were also generated. COMPARISON:  None. FINDINGS: CT HEAD FINDINGS Brain: No evidence of acute infarction, hemorrhage, hydrocephalus, extra-axial collection or mass lesion/mass effect. The posterior fossa, including the cerebellum, brainstem and fourth ventricle, is within normal limits. The third and lateral ventricles, and basal ganglia are unremarkable in appearance. The cerebral hemispheres are symmetric in appearance, with normal gray-white differentiation. No mass effect or midline shift is seen. Vascular: No hyperdense vessel or unexpected calcification. Skull: There is no evidence of fracture; visualized osseous structures are unremarkable in appearance. Sinuses/Orbits: The orbits are within normal limits. The patient is status post resection of the ethmoid air cells. The paranasal sinuses and mastoid air cells are well-aerated. Other: No significant soft tissue abnormalities are seen. CT CERVICAL SPINE FINDINGS Alignment: Normal. Skull base and vertebrae: No acute fracture. No primary bone lesion or focal pathologic process. A small nonspecific lucency is noted at the superior aspect of vertebral body C6. Soft tissues and spinal canal: No prevertebral fluid or swelling. No visible canal hematoma. Disc levels: Intervertebral disc spaces are preserved. The bony foramina are grossly unremarkable. Upper chest: Scattered calcifications are seen within the thyroid gland. The visualized lung apices are clear. Other: No additional soft tissue abnormalities are seen. IMPRESSION: 1. No evidence of traumatic  intracranial injury or fracture. 2. No evidence of fracture or subluxation along the cervical spine. 3. Small nonspecific lucency at the superior aspect of vertebral body C6. Electronically Signed   By: Garald Balding M.D.   On: 05/29/2017 23:33     0005:  Pt has tol PO well without N/V. Pt has ambulated with steady gait, easy resps. Workup reassuring. Pt requesting another dose of pain meds; will re-dose. Dx and testing d/w pt and family.  Questions  answered.  Verb understanding. Udip pending; likely d/c after results. Sign out to Dr. Randal Buba.      Final Clinical Impressions(s) / ED Diagnoses   Final diagnoses:  None    New Prescriptions New Prescriptions   No medications on file     Francine Graven, DO 05/30/17 0016

## 2017-05-30 LAB — URINALYSIS, ROUTINE W REFLEX MICROSCOPIC
Bilirubin Urine: NEGATIVE
Glucose, UA: NEGATIVE mg/dL
Hgb urine dipstick: NEGATIVE
Ketones, ur: NEGATIVE mg/dL
Leukocytes, UA: NEGATIVE
Nitrite: NEGATIVE
Protein, ur: NEGATIVE mg/dL
Specific Gravity, Urine: 1.013 (ref 1.005–1.030)
pH: 6 (ref 5.0–8.0)

## 2017-05-30 MED ORDER — FENTANYL CITRATE (PF) 100 MCG/2ML IJ SOLN
50.0000 ug | INTRAMUSCULAR | Status: DC | PRN
Start: 2017-05-30 — End: 2017-05-30
  Administered 2017-05-30: 50 ug via INTRAVENOUS

## 2017-05-30 MED ORDER — HEPARIN SOD (PORK) LOCK FLUSH 100 UNIT/ML IV SOLN
500.0000 [IU] | Freq: Once | INTRAVENOUS | Status: AC
  Administered 2017-05-30: 500 [IU] via INTRAVENOUS
  Filled 2017-05-30: qty 5

## 2017-05-30 MED ORDER — BACITRACIN ZINC 500 UNIT/GM EX OINT
TOPICAL_OINTMENT | CUTANEOUS | Status: AC
  Administered 2017-05-30: 02:00:00
  Filled 2017-05-30: qty 0.9

## 2017-05-30 NOTE — ED Notes (Signed)
Pt was able to ambulate without assitance.

## 2017-05-30 NOTE — Discharge Instructions (Signed)
Take your usual prescriptions as previously directed.  Apply moist heat or ice to the area(s) of discomfort, for 15 minutes at a time, several times per day for the next few days.  Do not fall asleep on a heating or ice pack.  The CT scan of your cervical spine showed an incidental finding:  "A small nonspecific lucency is noted at the superior aspect of vertebral body C6." Call your regular medical doctor on Monday to schedule a follow up appointment in the next 3 days.  Return to the Emergency Department immediately if worsening.

## 2017-08-17 HISTORY — PX: DIAGNOSTIC MAMMOGRAM: HXRAD719

## 2017-08-17 HISTORY — PX: CATARACT EXTRACTION: SUR2

## 2017-10-28 DIAGNOSIS — R002 Palpitations: Secondary | ICD-10-CM | POA: Insufficient documentation

## 2017-10-28 DIAGNOSIS — I447 Left bundle-branch block, unspecified: Secondary | ICD-10-CM | POA: Insufficient documentation

## 2018-03-31 DIAGNOSIS — G894 Chronic pain syndrome: Secondary | ICD-10-CM | POA: Insufficient documentation

## 2018-05-24 DIAGNOSIS — R600 Localized edema: Secondary | ICD-10-CM | POA: Insufficient documentation

## 2018-05-24 DIAGNOSIS — G8929 Other chronic pain: Secondary | ICD-10-CM | POA: Insufficient documentation

## 2018-06-08 ENCOUNTER — Other Ambulatory Visit: Payer: Self-pay

## 2018-06-08 ENCOUNTER — Emergency Department (HOSPITAL_BASED_OUTPATIENT_CLINIC_OR_DEPARTMENT_OTHER)
Admission: EM | Admit: 2018-06-08 | Discharge: 2018-06-08 | Disposition: A | Discharge status: Home / Self Care | Payer: Medicare Other | Attending: Emergency Medicine | Admitting: Emergency Medicine

## 2018-06-08 ENCOUNTER — Encounter (HOSPITAL_BASED_OUTPATIENT_CLINIC_OR_DEPARTMENT_OTHER): Payer: Self-pay

## 2018-06-08 DIAGNOSIS — Z9884 Bariatric surgery status: Secondary | ICD-10-CM | POA: Insufficient documentation

## 2018-06-08 DIAGNOSIS — Z969 Presence of functional implant, unspecified: Secondary | ICD-10-CM | POA: Diagnosis not present

## 2018-06-08 DIAGNOSIS — Z9049 Acquired absence of other specified parts of digestive tract: Secondary | ICD-10-CM | POA: Insufficient documentation

## 2018-06-08 DIAGNOSIS — E039 Hypothyroidism, unspecified: Secondary | ICD-10-CM | POA: Diagnosis not present

## 2018-06-08 DIAGNOSIS — Z79899 Other long term (current) drug therapy: Secondary | ICD-10-CM | POA: Insufficient documentation

## 2018-06-08 DIAGNOSIS — N189 Chronic kidney disease, unspecified: Secondary | ICD-10-CM | POA: Insufficient documentation

## 2018-06-08 DIAGNOSIS — H538 Other visual disturbances: Secondary | ICD-10-CM | POA: Diagnosis not present

## 2018-06-08 DIAGNOSIS — F319 Bipolar disorder, unspecified: Secondary | ICD-10-CM | POA: Diagnosis not present

## 2018-06-08 DIAGNOSIS — Z85118 Personal history of other malignant neoplasm of bronchus and lung: Secondary | ICD-10-CM | POA: Diagnosis not present

## 2018-06-08 DIAGNOSIS — H539 Unspecified visual disturbance: Secondary | ICD-10-CM

## 2018-06-08 MED ORDER — TETRACAINE HCL 0.5 % OP SOLN
OPHTHALMIC | Status: AC
  Filled 2018-06-08: qty 4

## 2018-06-08 MED ORDER — TETRACAINE HCL 0.5 % OP SOLN
2.0000 [drp] | Freq: Once | OPHTHALMIC | Status: AC
  Administered 2018-06-08: 2 [drp] via OPHTHALMIC

## 2018-06-08 NOTE — ED Notes (Signed)
ED Provider at bedside. 

## 2018-06-08 NOTE — ED Triage Notes (Signed)
Pt c/o cloudy vision "looks like i'm looking through water with dirt specks" x 2-3 days-pt with multiple falls-states she hit head on shower wall today-no LOC-to triage in w/c-NAD

## 2018-06-08 NOTE — Discharge Instructions (Addendum)
Follow-up with your eye doctor or the ophthalmologist on call.

## 2018-06-08 NOTE — ED Provider Notes (Signed)
[facility] EMERGENCY DEPARTMENT Provider Note   CSN: [id number] Arrival date & time: 06/08/18  1508     History   Chief Complaint Chief Complaint  Patient presents with  . Cloudy Vision    HPI Megan Brewer is a 72 y.o. female.  HPI Patient presents with vision changes.  States that her left eye for the last few days she has had some changes.  They will come and go and lasted for 5 minutes.  He covers her whole eye.  States she will get a feeling like her face is caving and also but that does not this really happened at the same time.  States she has a history of migraines and this vision change is different than the aura that she gets.  Has had cataract surgery on the side.  No trauma to the eye.  States she did have a fall where she bumped her head today but states that was after she Artie had the vision changes. Past Medical History:  Diagnosis Date  . Bipolar disorder ([place])   . Chronic back pain   . Chronic pain   . Chronic post-thoracotomy pain   . CKD (chronic kidney disease)   . GERD (gastroesophageal reflux disease)   . Hypogammaglobulinemia ([place])   . Hypothyroid   . 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 Lung cancer ([place]) 2011, 2014  . Memory loss   . Migraine   . Restless legs   . Sleep apnea   . Tremor   . Urge incontinence     Patient Active Problem List   Diagnosis Date Noted  . ADENOCARCINOMA, RIGHT LUNG 01/31/2010  . OTHER SELECTIVE IMMUNOGLOBULIN DEFICIENCIES 01/31/2010  . ARACHNOIDITIS 01/31/2010  . OBSTRUCTIVE SLEEP APNEA 01/31/2010  . RHINOSINUSITIS, ALLERGIC, CHRONIC 01/31/2010    Past Surgical History:  Procedure Laterality Date  . ABDOMINAL HYSTERECTOMY    . CARPAL TUNNEL RELEASE    . CHOLECYSTECTOMY    . GASTRIC BYPASS    . SPINAL CORD STIMULATOR IMPLANT       OB History   None      Home Medications    Prior to Admission medications   Medication Sig Start Date End Date Taking? Authorizing Provider    acetaminophen (TYLENOL) 650 MG CR tablet Take 1,300 mg by mouth every 8 (eight) hours as needed for pain or fever.     [provider]  Aspirin-Caffeine (BC FAST PAIN RELIEF) 845-65 MG PACK Take 1 Package by mouth every 4 (four) hours as needed. Pain    [provider]  Buprenorphine HCl (BELBUCA) 750 MCG FILM Place 1 tablet inside cheek 2 (two) times daily.  09/04/15   [provider]  Calcium Citrate 200 MG TABS Take 950 mg by mouth daily.  12/02/16   [provider]  cetirizine (ZYRTEC) 10 MG tablet Take 10 mg by mouth daily.    [provider]  cycloSPORINE (RESTASIS) 0.05 % ophthalmic emulsion Place 1 drop into both eyes 2 (two) times daily.    [provider]  Diclofenac Potassium (CAMBIA) 50 MG PACK Take 50 mg by mouth daily as needed. Headache    [provider]  Gabapentin Enacarbil 600 MG TBCR Take 600 mg by mouth 2 (two) times daily.     [provider]  immune globulin, human, (GAMMAGARD S/D LESS IGA) 10 g injection Inject into the vein every 21 ( twenty-one) days.     [provider]  lansoprazole (PREVACID SOLUTAB) 30 MG  disintegrating tablet Take 30 mg by mouth 2 (two) times daily.     [provider]  levothyroxine (SYNTHROID, LEVOTHROID) 75 MCG tablet Take 75 mcg by mouth daily before breakfast.  12/14/16   [provider]  LORazepam (ATIVAN) 0.5 MG tablet Take 0.5 mg by mouth at bedtime.  03/25/17   [provider]  lubiprostone (AMITIZA) 24 MCG capsule Take 24 mcg by mouth daily with breakfast.    [provider]  magnesium oxide (MAG-OX) 400 MG tablet Take 400 mg by mouth daily.     [provider]  Melatonin 5 MG CAPS Take 5 mg by mouth at bedtime.     [provider]  ondansetron (ZOFRAN) 8 MG tablet Take 8 mg by mouth every 8 (eight) hours as needed for nausea or vomiting.     [provider]  OVER THE COUNTER MEDICATION Take 1 tablet by  mouth 2 (two) times daily. cognium supplyment    [provider]  OVER THE COUNTER MEDICATION Take 1 tablet by mouth daily. ADEK    [provider]  potassium chloride SA (K-DUR,KLOR-CON) 20 MEQ tablet Take 40 mEq by mouth daily.  05/23/13   [provider]  pyridOXINE (VITAMIN B-6) 100 MG tablet Take 100 mg by mouth daily.    [provider]  rOPINIRole (REQUIP) 0.5 MG tablet Take 0.5 mg by mouth at bedtime.     [provider]  sertraline (ZOLOFT) 100 MG tablet Take 100 mg by mouth 2 (two) times daily.  03/25/17   [provider]  sucralfate (CARAFATE) 1 GM/10ML suspension Take 200 mg by mouth 3 (three) times daily as needed. Mouth sores    [provider]  torsemide (DEMADEX) 20 MG tablet Take 20 mg by mouth daily.  11/04/12   [provider]  vitamin B-12 (CYANOCOBALAMIN) 1000 MCG tablet Take 1,000 mcg by mouth daily.     [provider]    Family History No family history on file.  Social History Social History   Tobacco Use  . Smoking status: Never Smoker  . Smokeless tobacco: Never Used  Substance Use Topics  . Alcohol use: Yes    Comment: rare  . Drug use: Never     Allergies   Butorphanol tartrate; Tetracycline; Corticosteroids; Cefixime; Ciprofloxacin; Doxycycline; Isometheptene-dichloral-apap; Ketorolac; Prednisone; Promethazine; Erythromycin base; and Propoxyphene   Review of Systems Review of Systems  Constitutional: Negative for appetite change.  HENT: Negative for congestion.   Eyes: Positive for visual disturbance. Negative for photophobia.  Respiratory: Negative for shortness of breath.   Cardiovascular: Negative for chest pain.  Gastrointestinal: Negative for abdominal pain.  Genitourinary: Negative for flank pain.  Musculoskeletal: Negative for back pain.  Neurological: Negative for weakness.  Psychiatric/Behavioral: Negative for confusion.     Physical Exam Updated Vital  Signs BP (!) 142/98 (BP Location: Left Arm)   Pulse 63   Temp 98.1 F (36.7 C) (Oral)   Resp 18   Ht 5\' 4"  (1.626 m)   Wt 61.2 kg   SpO2 100%   BMI 23.17 kg/m   Physical Exam  Constitutional: She is oriented to person, place, and time. She appears well-developed.  HENT:  Head: Normocephalic.  Eyes: Pupils are equal, round, and reactive to light. EOM are normal. Right eye exhibits no discharge. Left eye exhibits no discharge.  Neck: Normal range of motion.  Cardiovascular: Normal rate.  Neurological: She is alert and oriented to person, place, and time.  Eye  movements intact.  Pupils reactive.  Visual fields grossly intact by confrontation.  Skin: Skin is warm.     ED Treatments / Results  Labs (all labs ordered are listed, but only abnormal results are displayed) Labs Reviewed - No data to display  EKG None  Radiology No results found.  Procedures Procedures (including critical care time)  Medications Ordered in ED Medications  tetracaine (PONTOCAINE) 0.5 % ophthalmic solution 2 drop (2 drops Both Eyes Given 06/08/18 1633)     Initial Impression / Assessment and Plan / ED Course  I have reviewed the triage vital signs and the nursing notes.  Pertinent labs & imaging results that were available during my care of the patient were reviewed by me and considered in my medical decision making (see chart for details).     Patient presents visual changes.  Has good visual acuity.  No trauma.  Doubt this is a central cause.  I think patient would benefit from ophthalmology follow-up.  States she has an ophthalmologist where she lives but is also in the process of moving here.  Given the doctor on call.  Will discharge. Final Clinical Impressions(s) / ED Diagnoses   Final diagnoses:  Visual changes    ED Discharge Orders    None       Davonna Belling, MD 06/08/18 1657

## 2018-09-08 IMAGING — CR DG SHOULDER 2+V*L*
2 series · 2 of 2 positions shown · non-contrast
Comparison: None.

CLINICAL DATA: Trip and fall injury earlier today.

EXAM:
LEFT SHOULDER - 2+ VIEW

[x shoulder ap left (1 of 2)]
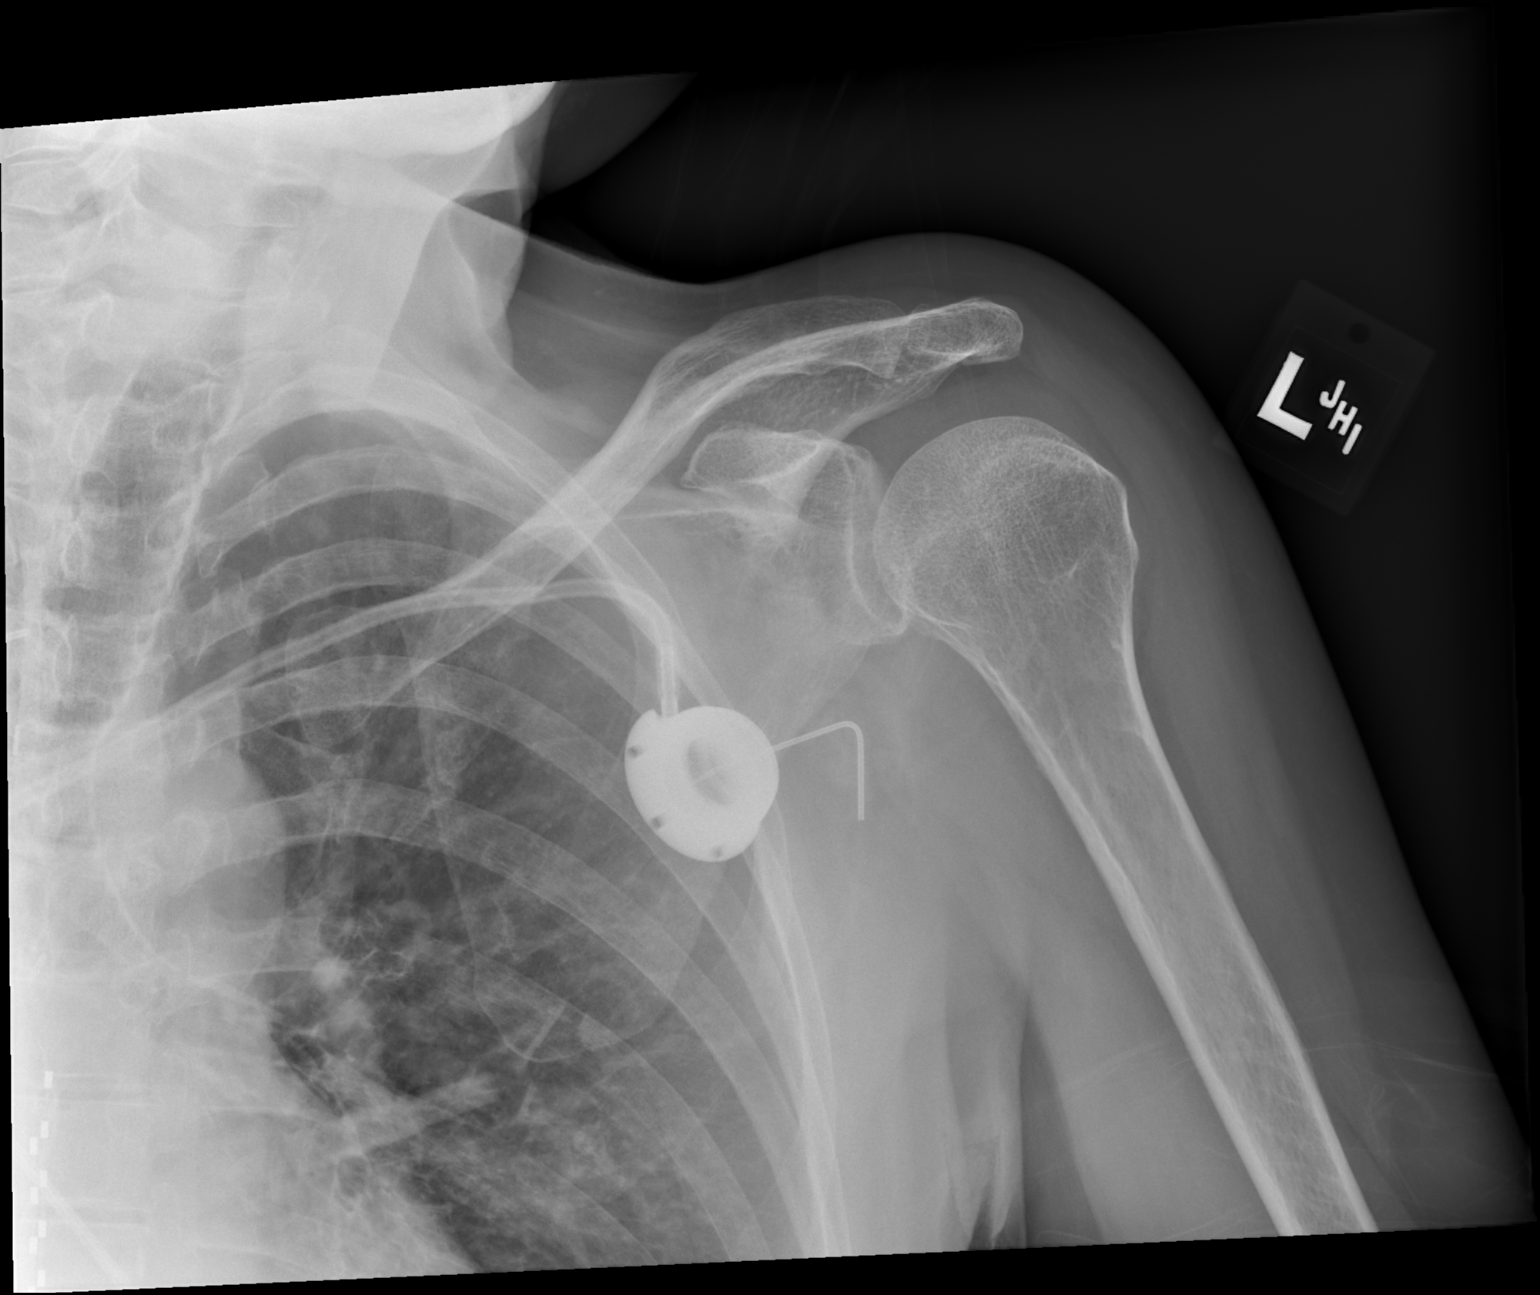

[x shoulder ap left (2 of 2)]
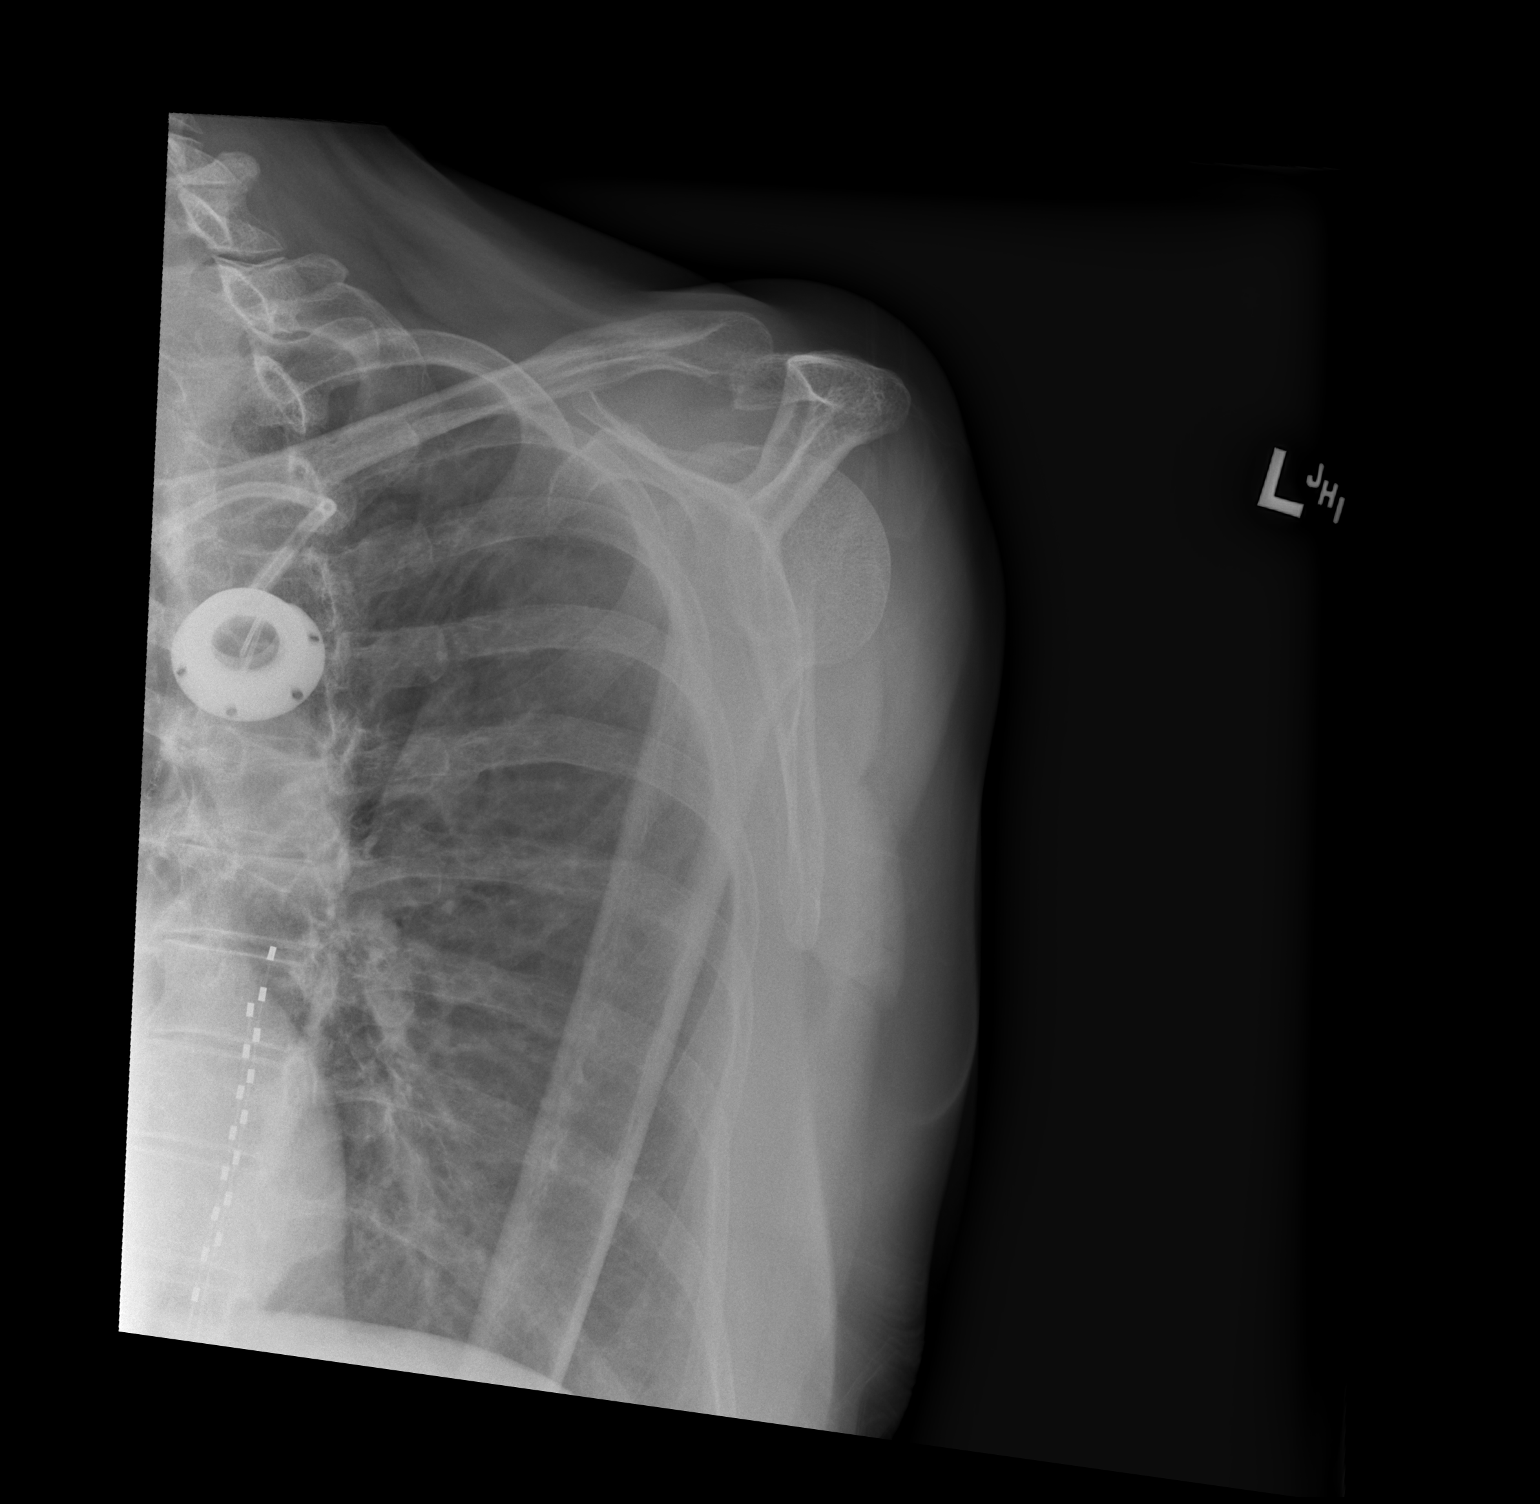

[2 of 2 positions shown; findings below may reference images not displayed]

FINDINGS: There is no evidence of fracture or dislocation. There is no
evidence of arthropathy or other focal bone abnormality. Soft
tissues are unremarkable.
IMPRESSION: Negative.

## 2018-09-08 IMAGING — CR DG KNEE COMPLETE 4+V*L*
4 series · 4 of 4 positions shown · non-contrast
Comparison: None.

CLINICAL DATA: Status post fall, with left knee pain. Initial
encounter.

EXAM:
LEFT KNEE - COMPLETE 4+ VIEW

[x knee ap left (1 of 3)]
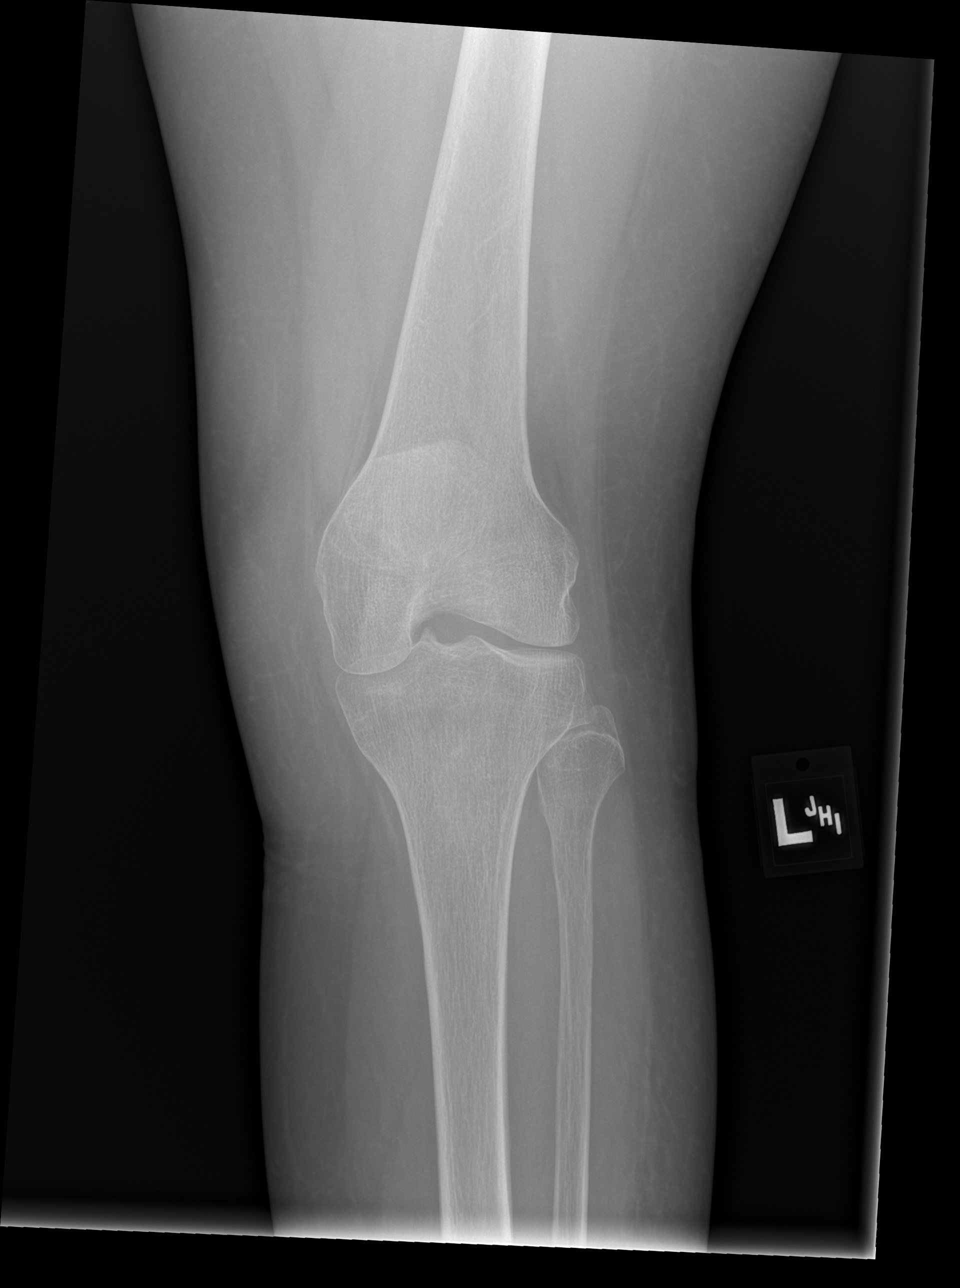

[x knee ap left (2 of 3)]
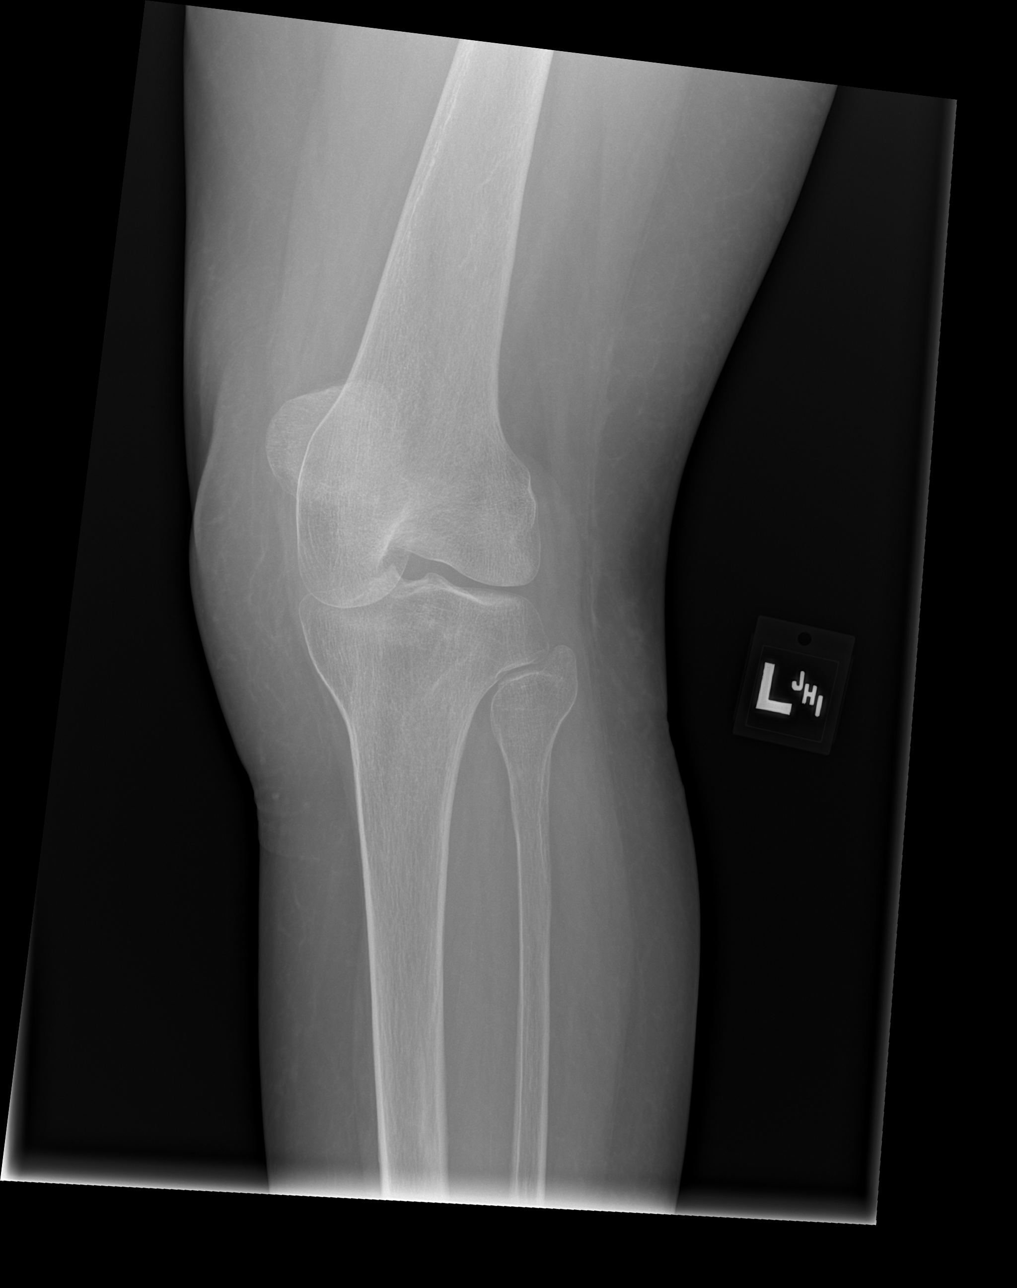

[x knee ap left (3 of 3)]
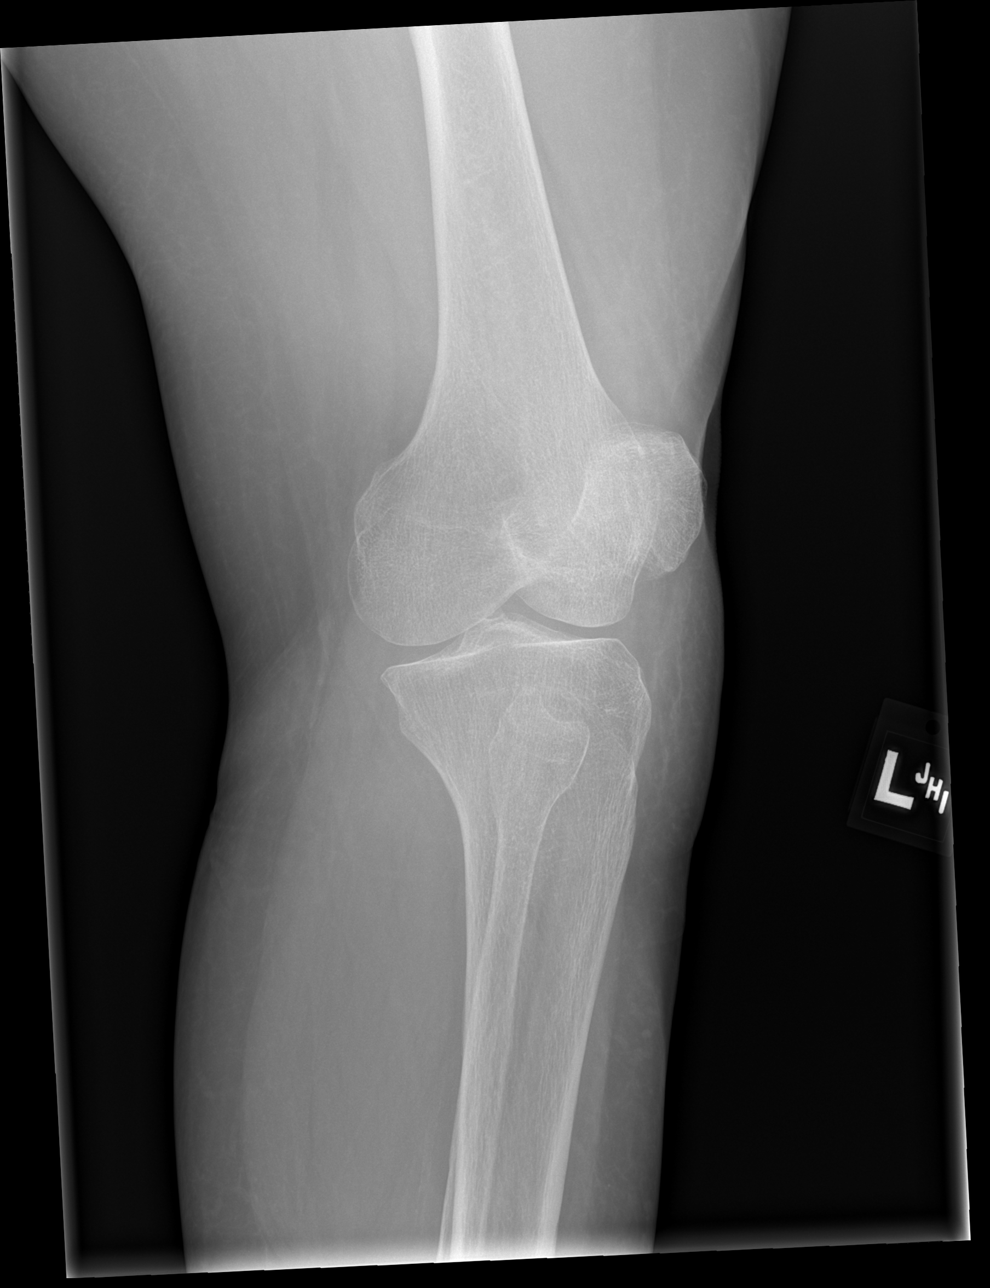

[x knee lat left]
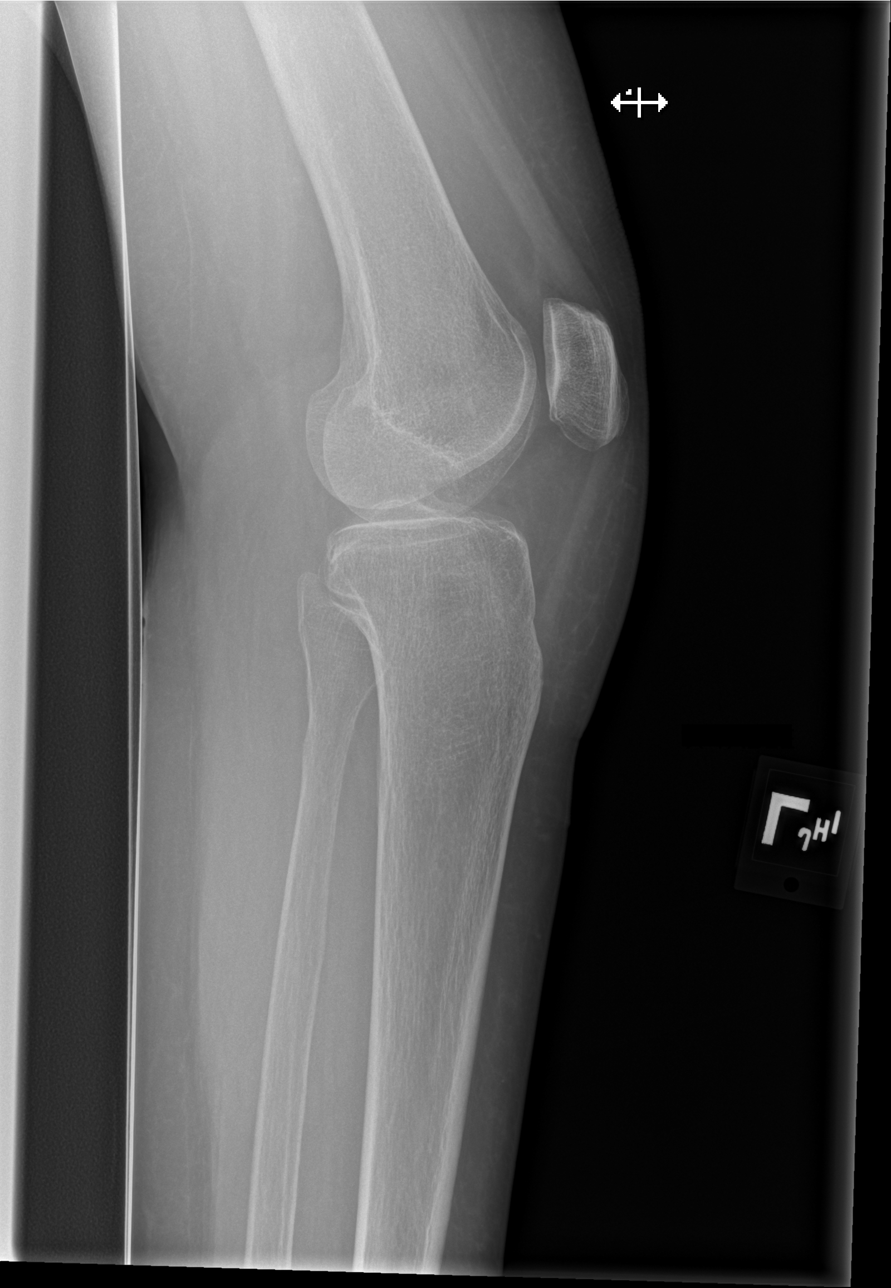

[4 of 4 positions shown; findings below may reference images not displayed]

FINDINGS: There is no evidence of fracture or dislocation. The joint spaces
are preserved. No significant degenerative change is seen; the
patellofemoral joint is grossly unremarkable in appearance.

No significant joint effusion is seen. The visualized soft tissues
are normal in appearance.
IMPRESSION: No evidence of fracture or dislocation.

## 2018-09-08 IMAGING — CR DG ELBOW COMPLETE 3+V*L*
4 series · 4 of 4 positions shown · non-contrast
Comparison: None.

CLINICAL DATA: Trip and fall injury earlier today.

EXAM:
LEFT ELBOW - COMPLETE 3+ VIEW

[x elbow obl left (1 of 2)]
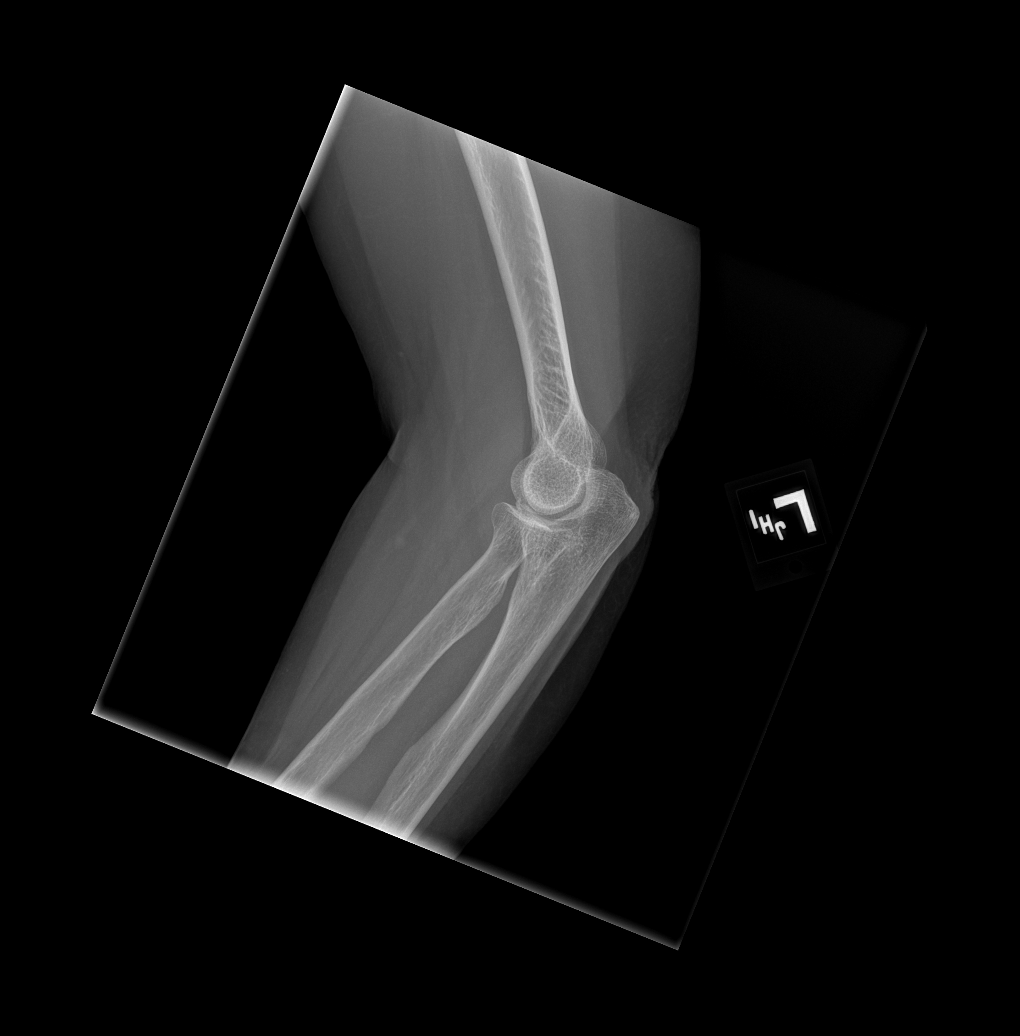

[x elbow ap left]
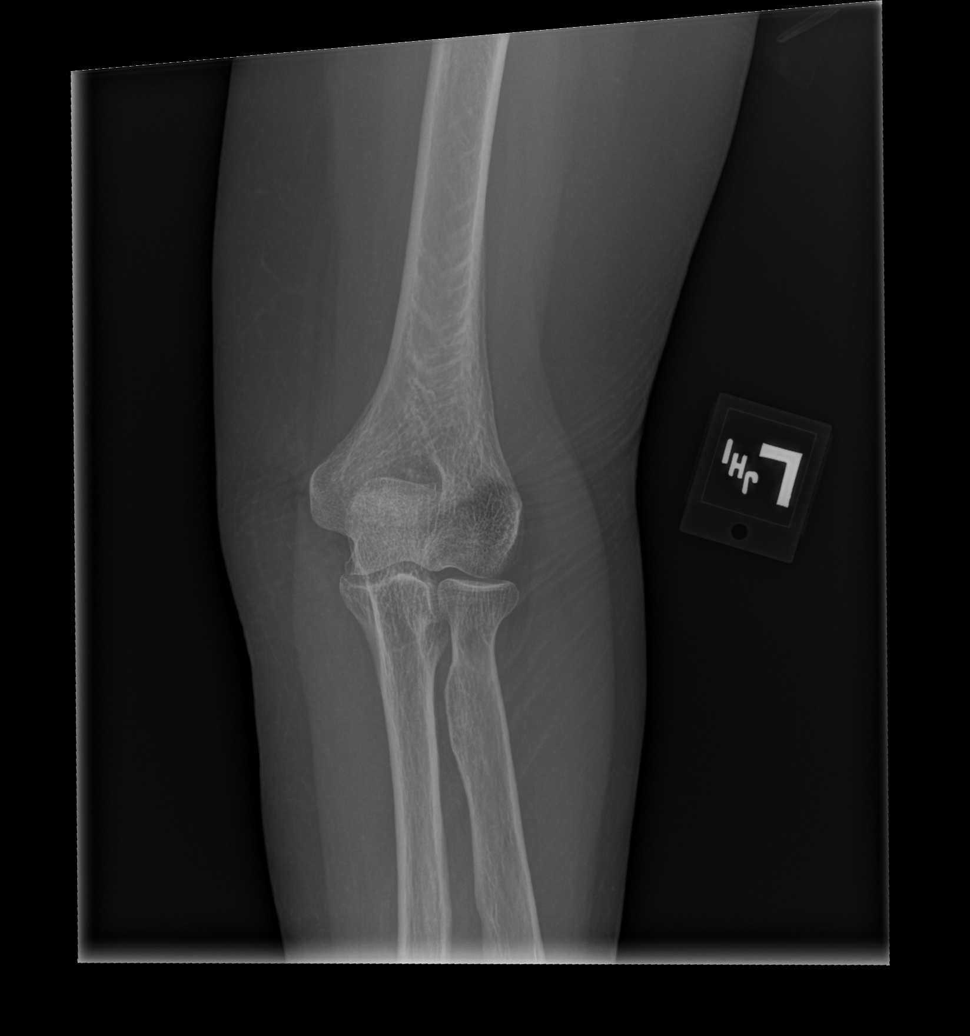

[x elbow obl left (2 of 2)]
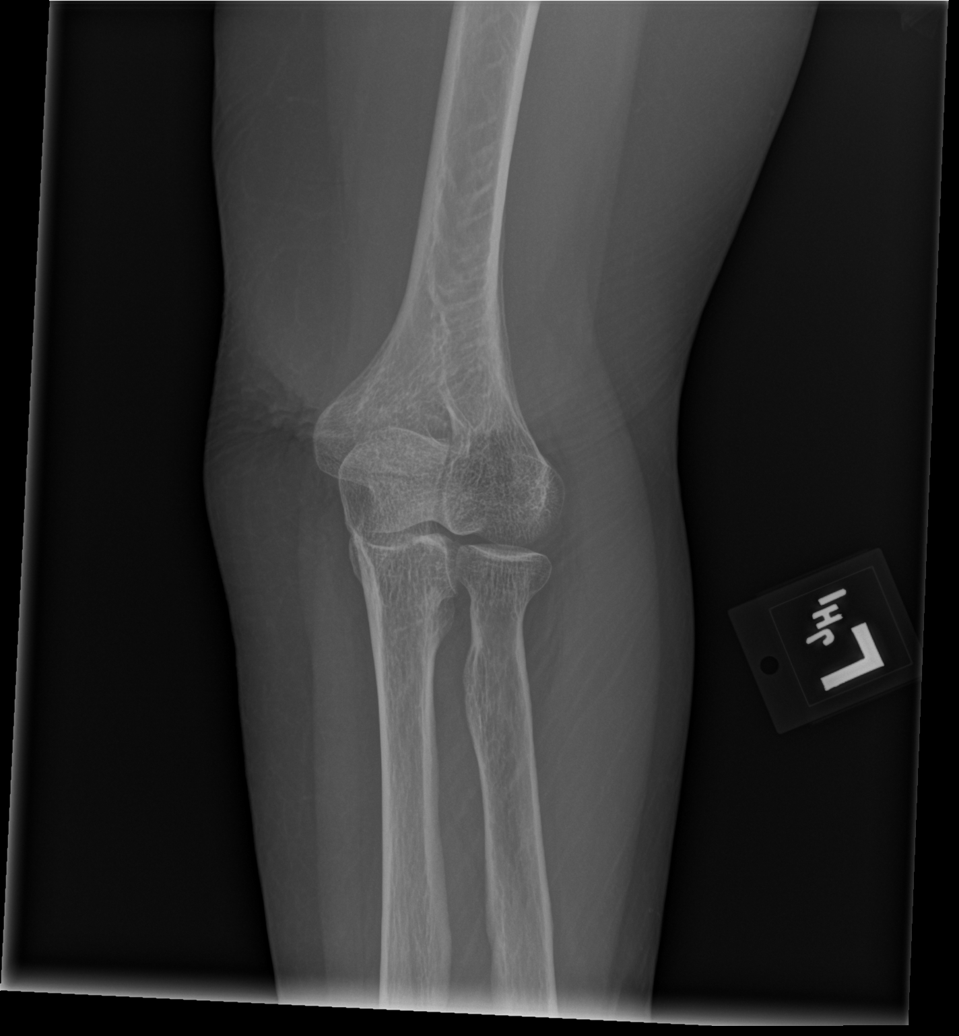

[x elbow lat left]
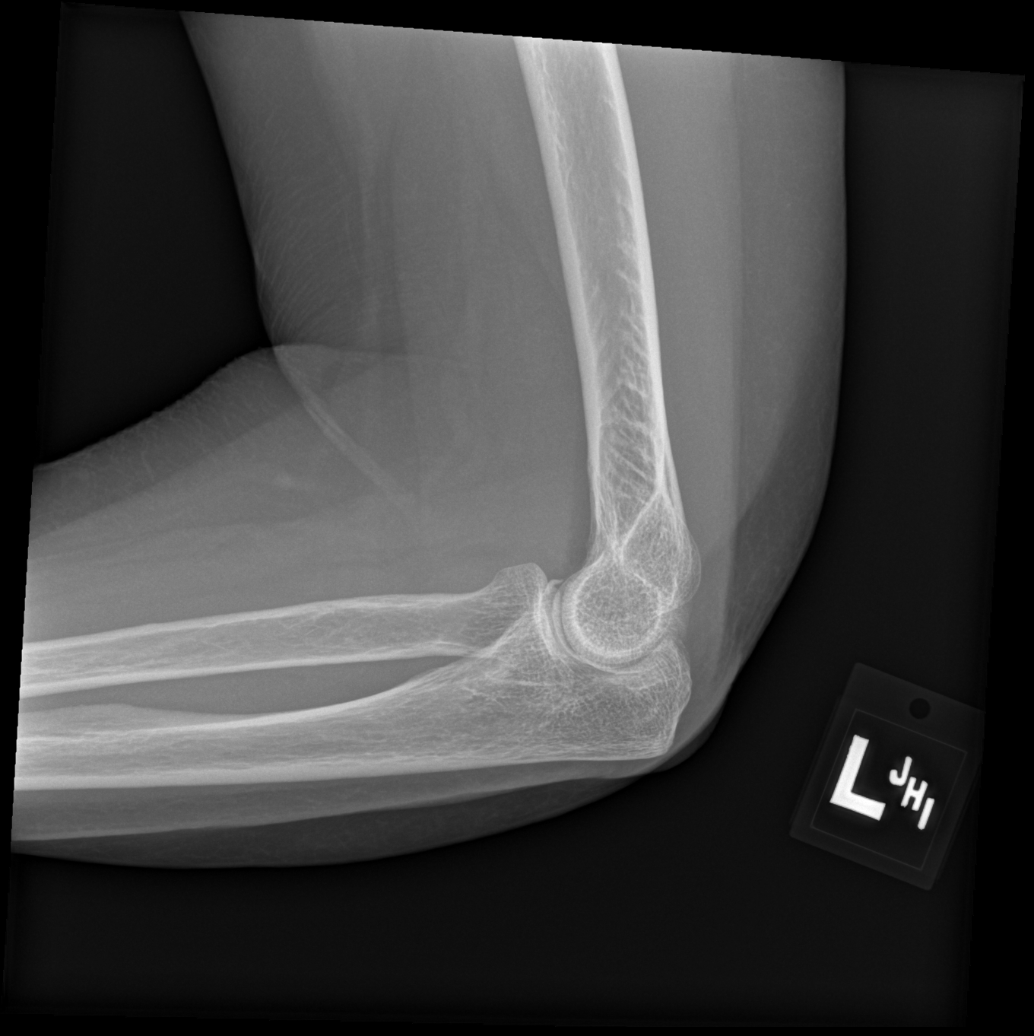

[4 of 4 positions shown; findings below may reference images not displayed]

FINDINGS: There is no evidence of fracture, dislocation, or joint effusion.
There is no evidence of arthropathy or other focal bone abnormality.
Soft tissues are unremarkable.
IMPRESSION: Negative.

## 2018-11-03 ENCOUNTER — Ambulatory Visit: Payer: Self-pay | Admitting: Internal Medicine

## 2018-11-29 ENCOUNTER — Ambulatory Visit: Payer: Medicare Other | Admitting: Nurse Practitioner

## 2018-12-20 ENCOUNTER — Telehealth: Payer: Self-pay

## 2018-12-20 ENCOUNTER — Encounter: Payer: Self-pay | Admitting: Nurse Practitioner

## 2018-12-20 ENCOUNTER — Other Ambulatory Visit: Payer: Self-pay

## 2018-12-20 ENCOUNTER — Ambulatory Visit (INDEPENDENT_AMBULATORY_CARE_PROVIDER_SITE_OTHER): Payer: Medicare Other | Admitting: Nurse Practitioner

## 2018-12-20 DIAGNOSIS — F332 Major depressive disorder, recurrent severe without psychotic features: Secondary | ICD-10-CM

## 2018-12-20 DIAGNOSIS — M810 Age-related osteoporosis without current pathological fracture: Secondary | ICD-10-CM | POA: Diagnosis not present

## 2018-12-20 DIAGNOSIS — G43919 Migraine, unspecified, intractable, without status migrainosus: Secondary | ICD-10-CM

## 2018-12-20 DIAGNOSIS — N390 Urinary tract infection, site not specified: Secondary | ICD-10-CM

## 2018-12-20 DIAGNOSIS — R1084 Generalized abdominal pain: Secondary | ICD-10-CM

## 2018-12-20 DIAGNOSIS — D801 Nonfamilial hypogammaglobulinemia: Secondary | ICD-10-CM

## 2018-12-20 DIAGNOSIS — R110 Nausea: Secondary | ICD-10-CM

## 2018-12-20 DIAGNOSIS — C3490 Malignant neoplasm of unspecified part of unspecified bronchus or lung: Secondary | ICD-10-CM

## 2018-12-20 DIAGNOSIS — R600 Localized edema: Secondary | ICD-10-CM

## 2018-12-20 DIAGNOSIS — G8922 Chronic post-thoracotomy pain: Secondary | ICD-10-CM

## 2018-12-20 NOTE — Progress Notes (Signed)
This service is provided via telemedicine  No vital signs collected/recorded due to the encounter was a telemedicine visit.   Location of patient (ex: home, work): Home  Patient consents to a telephone visit: Yes  Location of the provider (ex: office, home):  [facility], Office   Name of any referring provider:  N/A  Names of all persons participating in the telemedicine service and their role in the encounter:  S.Chrae B/CMA, Sherrie Mustache, NP, and Patient   Time spent on call:  19 min with Medical Assistant   Virtual Visit via Telephone Note  I connected with Megan Brewer on 12/20/18 at  1:30 PM EDT by telephone and verified that I am speaking with the correct person using two identifiers.  Location: Patient: home Provider:  Office    I discussed the limitations, risks, security and privacy concerns of performing an evaluation and management service by telephone and the availability of in person appointments. I also discussed with the patient that there may be a patient responsible charge related to this service. The patient expressed understanding and agreed to proceed.     Careteam: Patient Care Team: [name], NP as PCP - General (Geriatric Medicine)  Advanced Directive information Does Patient Have a Medical Advance Directive?: No, Would patient like information on creating a medical advance directive?: No - Patient declined  Allergies  Allergen Reactions  . Butorphanol Tartrate Other (See Comments)    "like someone is brushing my brain with a brillo pad"  . Tetracycline Nausea Only    Causes ringing in ears, dizziness, migraine, nausea  . Corticosteroids Other (See Comments)    12/02/2016 interview by Melissa Montane: Oral dosing causes migraines/nausea/tinnitus. Prev tolerated IV and intranasal admin w/o difficulty.  . Cefixime Other (See Comments)    Headache   . Ciprofloxacin Other (See Comments)    Headache, dizziness, ringing in the ears  .  Doxycycline Other (See Comments)    unknown  . Gabapentin     Throat swells/closes  . Isometheptene-Dichloral-Apap Other (See Comments)    unknown  . Ketorolac     12/02/2016 interview by [name]: Oral dosing prednisone/corticosteroids causes migraines/nausea/tinnitus. Prev tolerated IV and intranasal admin w/o difficulty.  . Prednisone Other (See Comments)    Migraine, dizzy, ringing in ears-can take it IV 12/02/2016 interview by [name]: Oral prednisone dosing causes migraines/nausea/tinnitus. Prev tolerated IV and intranasal admin w/o difficulty.  . Promethazine Other (See Comments)    causes severe abdominal pain when taken IV; however she can take PO or IM  . Stadol [Butorphanol]     Cerebral pain  . Erythromycin Base Diarrhea and Itching    Severe diarrhea  . Propoxyphene Nausea Only    Chief Complaint  Patient presents with  . Establish Care    New patient establish care. Patient c/o weakness, nausea, and dizziness.  Televisit   . Medication Refill    Refill Synthroid and Potassium     HPI: Patient is a 73 y.o. female to establish care.  Reports her AWV was in the fall.   Reports she is new to the area and Dr Sheffield Slider recommended [facility]. Her husband is from [place].   Chronic pain/opoid addiction- has had ongoing pain since she had lung cancer, reports she has "pain issues" uses tylenol, BC power and Taking suboxone- Pres[name]  She was previously on lorazepam and changed to tizanidine 2 mg by mouth three times  daily but trying to decrease to daily because wants to sleep all day Has chronic constipation- uses align daily also uses amitiza   GERD- has reflux, nausea, stomach issues. Takes prevacid 300 mg by mouth twice daily, will use peptofor breakthrough. Will use pepto first then Uses zofran 8 mg by mouth every 8 hours as needed if pepto not effective then if zofran does not work will go to carafate. Has not  been followed by GI specialist but was hoping to follow up with one due to her stomach pain. Hx of gastric bypass, taking b vitamins.   Taking mag oxide for pain and sleep   Osteoporosis- taking Calcium, due for bone density. Has been on prolia but unsure when last injection was   IGG def- taking infusions every 3 weeks. Dr Londell Moh was ordering, will need referral to specialist in town, his office was in [place], [place].   Insomnia- taking melatonin 5 mg by mouth at bedtime.  Taking a lot of supplements, Vit ADEK (was prescribed due to malnutrition)  Reports hx of asymptomatic UTIs- using cranberry supplement.   Bipolar disorder- taking zoloft- goes from 1 extreme to another, reports "definition of control is debatable" does not have psychiatrist   Hypothyroid- continues on levothyroid 75 mcg.  Memory loss- using prevagen daily   RLS- taking Requip 0.5 mg by mouth daily- takes 2 tablets daily   Reports she was diagnosed "interstitial edema of unknown etiology"- started having swelling when she was 73 year old and demadex 20 mg with potassium supplement  helps control symptoms.   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   Hx of migraines- from when she was a teenager, using botox injections for treatment. [place] headache has been following.   Hx of opoid addiction- all were prescribed due to migraines and lung cancer pain. States she was not abusing but using what was prescribed.   [facility] in [place] is her pharmacy- [facility] has taken over most of her medication except botox, suboxone and immune globulin   Pt with hx of Abnormal weight loss- per last OV with previous PCP she reported that she had not lost weight in the since last visit, and reports weight remains stable at 122 lb. They obtained a CT CAP which showed no evidence of metastases.     Review of Systems: per new  patient packet.  Review of Systems  Constitutional: Positive for malaise/fatigue and weight loss. Negative for chills and fever.  HENT: Positive for congestion, hearing loss and tinnitus.   Respiratory: Positive for sputum production and shortness of breath.   Cardiovascular: Positive for palpitations. Negative for chest pain and leg swelling.  Gastrointestinal: Positive for abdominal pain, constipation, heartburn and nausea.  Genitourinary: Positive for frequency and urgency.       Incontinence   Musculoskeletal: Positive for back pain, joint pain, myalgias and neck pain.  Skin: Positive for itching and rash.  Neurological: Positive for dizziness, weakness and headaches.  Psychiatric/Behavioral: Positive for depression and memory loss. The patient is nervous/anxious and has insomnia.     Past Medical History:  Diagnosis Date  . Arachnoiditis   . Bipolar disorder ([place])   . Chronic back pain   . Chronic pain   . Chronic post-thoracotomy pain   . CKD (chronic kidney disease)   . GERD (gastroesophageal reflux disease)   . Hypogammaglobulinemia ([place])   . Hypotension   .  Hypothyroid   . 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 Lung cancer ([place]) 2011, 2014  . Memory loss   . Migraine   . Opioid abuse ([place])   . Osteoporosis   . Presence of neurostimulator   . Restless legs   . Sleep apnea   . Tremor   . Urge incontinence    Past Surgical History:  Procedure Laterality Date  . ABDOMINAL HYSTERECTOMY    . CARPAL TUNNEL RELEASE    . CATARACT EXTRACTION Bilateral 2019  . CHOLECYSTECTOMY    . COLONOSCOPY  2015   [facility]  . CT LUNG SCREENING  2018  . DG  BONE DENSITY ([place] HX)  2018  . DIAGNOSTIC MAMMOGRAM  2019  . GASTRIC BYPASS    . SPINAL CORD STIMULATOR IMPLANT     Social History:   reports that she has never smoked. She has never used smokeless tobacco. She reports current alcohol use. She reports that she does not use drugs.  Family History  Problem Relation Age of  Onset  . Hypertension Mother   . GER disease Mother   . Dementia Mother   . Osteoporosis Mother   . Arthritis Mother   . Parkinson's disease Father   . Congestive Heart Failure Father   . Pneumonia Father   . Arthritis Father   . Dementia Father   . Asthma Sister   . Asthma Daughter     Medications: Patient's Medications  New Prescriptions   No medications on file  Previous Medications   ACETAMINOPHEN (TYLENOL) 650 MG CR TABLET    Take 1,300 mg by mouth every 8 (eight) hours as needed for pain or fever.    APOAEQUORIN (PREVAGEN PO)    1 by mouth daily   ASPIRIN-CAFFEINE (BC FAST PAIN RELIEF) 845-65 MG PACK    Take 1 Package by mouth as needed (up to 2 packets daily). Pain   BACILLUS COAGULANS-INULIN (ALIGN PREBIOTIC-PROBIOTIC PO)    1 by mouth daily   BISMUTH SUBSALICYLATE (PEPTO-BISMOL PO)    1-2 daily as needed    BUPRENORPHINE HCL-NALOXONE HCL (SUBOXONE) 4-1 MG FILM    Place under the tongue. 2 daily   CALCIUM CITRATE 200 MG TABS    Take 950 mg by mouth daily.    CYCLOSPORINE (RESTASIS) 0.05 % OPHTHALMIC EMULSION    Place 1 drop into both eyes 2 (two) times daily.   DICLOFENAC POTASSIUM (CAMBIA) 50 MG PACK    Take 50 mg by mouth daily as needed. Headache   FLUTICASONE (FLONASE) 50 MCG/ACT NASAL SPRAY    Place 1 spray into both nostrils 2 (two) times a day.   GUAIFENESIN (MUCUS RELIEF) 400 MG TABS TABLET    Take 400 mg by mouth as needed.    HOMEOPATHIC PRODUCTS (EARACHE RELIEF OT)    Place in ear(s). PRN   IMMUNE GLOBULIN, HUMAN, (GAMMAGARD S/D LESS IGA) 10 G INJECTION    Inject into the vein every 21 ( twenty-one) days.    LANSOPRAZOLE (PREVACID SOLUTAB) 30 MG DISINTEGRATING TABLET    Take 30 mg by mouth 2 (two) times daily.    LEVOTHYROXINE (SYNTHROID, LEVOTHROID) 75 MCG TABLET    Take 75 mcg by mouth daily before breakfast.    LUBIPROSTONE (AMITIZA) 24 MCG CAPSULE    Take 24 mcg by mouth as needed.    MAGNESIUM OXIDE (MAG-OX) 400 MG TABLET    Take 400 mg by mouth daily.     MELATONIN 5 MG CAPS    Take  5 mg by mouth at bedtime.    MISC NATURAL PRODUCTS (CRAN-B-OTC PO)    Take by mouth. 1 daily   ONDANSETRON (ZOFRAN) 8 MG TABLET    Take 8 mg by mouth every 8 (eight) hours as needed for nausea or vomiting.    OVER THE COUNTER MEDICATION    Take 1 tablet by mouth daily. ADEK   POTASSIUM CHLORIDE SA (K-DUR,KLOR-CON) 20 MEQ TABLET    Take 40 mEq by mouth daily.    PYRIDOXINE (VITAMIN B-6) 100 MG TABLET    Take 100 mg by mouth daily.   ROPINIROLE (REQUIP) 0.5 MG TABLET    2 by mouth daily   SERTRALINE (ZOLOFT) 100 MG TABLET    Take 100 mg by mouth 2 (two) times daily.    SUCRALFATE (CARAFATE) 1 GM/10ML SUSPENSION    Take 200 mg by mouth 3 (three) times daily as needed. Mouth sores   TIZANIDINE (ZANAFLEX) 2 MG TABLET    Take 2 mg by mouth 3 (three) times daily.   TORSEMIDE (DEMADEX) 20 MG TABLET    Take 20 mg by mouth daily.    VITAMIN B-12 (CYANOCOBALAMIN) 1000 MCG TABLET    Take 1,000 mcg by mouth daily.   Modified Medications   No medications on file  Discontinued Medications   BUPRENORPHINE HCL (BELBUCA) 750 MCG FILM    Place 1 tablet inside cheek 2 (two) times daily.    CETIRIZINE (ZYRTEC) 10 MG TABLET    Take 10 mg by mouth daily.   GABAPENTIN ENACARBIL 600 MG TBCR    Take 600 mg by mouth 2 (two) times daily.    LORAZEPAM (ATIVAN) 0.5 MG TABLET    Take 0.5 mg by mouth at bedtime.    MAGNESIUM 500 MG TABS    Take by mouth. 1 daily   OVER THE COUNTER MEDICATION    Take 1 tablet by mouth 2 (two) times daily. cognium supplyment     Physical Exam: unable due to tele-visit    Labs reviewed: Basic Metabolic Panel: No results for input(s): NA, K, CL, CO2, GLUCOSE, BUN, CREATININE, CALCIUM, MG, PHOS, TSH in the last 8760 hours. Liver Function Tests: No results for input(s): AST, ALT, ALKPHOS, BILITOT, PROT, ALBUMIN in the last 8760 hours. No results for input(s): LIPASE, AMYLASE in the last 8760 hours. No results for input(s): AMMONIA in the last 8760 hours. CBC:  No results for input(s): WBC, NEUTROABS, HGB, HCT, MCV, PLT in the last 8760 hours. Lipid Panel: No results for input(s): CHOL, HDL, LDLCALC, TRIG, CHOLHDL, LDLDIRECT in the last 8760 hours. TSH: No results for input(s): TSH in the last 8760 hours. A1C: No results found for: HGBA1C   Assessment/Plan 1. Generalized abdominal pain Hx of Gastric bypass, reports ongoing Nausea with abdominal pain/cramps for well over a year. Would like referral for further evaluation by GI - Ambulatory referral to Gastroenterology  2. Nausea -ongoing for over a year, on multiple medication and wishes for GI referral.  - Ambulatory referral to Gastroenterology  3. Intractable migraine without status migrainosus, unspecified migraine type -controlled on botox injection which were being done at her previous Headache specialist.  - AMB referral to headache clinic  4. Osteoporosis, unspecified osteoporosis type, unspecified pathological fracture presence -previously on prolia but reports has been about a year since last injection. Continues on calcium and Vit D - DG Bone Density; Future  5. Lower leg edema Continues on torsemide and potassium supplement  6. Severe episode of recurrent major depressive  disorder, without psychotic features ([place]) Continues on zoloft 100 mg by mouth twice daily, information given on psych services in town.  7. Chronic post-thoracotomy pain Chronic pain, followed by [name] and remains on suboxone. She also takes tizanidine as needed.  8. Recurrent UTI Followed by Urology at [facility] at this time. No signs of UTI on visit in February.   9. Malignant neoplasm of bronchus and lung ([place]) S/p thoracotomy and being monitored by oncologist at [facility], had a surveillance scan in July 2019 and then repeat CT due to weight loss which was without signs of metastases per epic review.  10. Hypogammaglobulinemia ([place]) -previously was followed by Dr Londell Moh in [place], will need  new specialist now that she has moved, she reports she had been getting IVIG infusion per home health - Ambulatory referral to Allergy  Next appt: 1 month for routine follow up [employer]. Harle Battiest  [facility] [phone]    Follow Up Instructions:    I discussed the assessment and treatment plan with the patient. The patient was provided an opportunity to ask questions and all were answered. The patient agreed with the plan and demonstrated an understanding of the instructions.   The patient was advised to call back or seek an in-person evaluation if the symptoms worsen or if the condition fails to improve as anticipated.  I provided 42 minutes of non-face-to-face time during this encounter.   [name], NP avs printed and mailed

## 2018-12-20 NOTE — Telephone Encounter (Signed)
Per [name], NP request, I called [facility] (spoke with Levada Dy) and requested patients medication profile to verify all prescribed med.  Patient established care with [place] today via telephone visit.  Awaiting Fax (will be sent to [name], NP attention)

## 2018-12-28 ENCOUNTER — Encounter: Payer: Self-pay | Admitting: Allergy and Immunology

## 2018-12-28 ENCOUNTER — Ambulatory Visit: Payer: Medicare Other | Admitting: Allergy and Immunology

## 2018-12-28 ENCOUNTER — Other Ambulatory Visit: Payer: Self-pay

## 2018-12-28 VITALS — BP 88/60 | HR 75 | Temp 97.6°F | Resp 16 | Ht 64.0 in | Wt 120.0 lb

## 2018-12-28 DIAGNOSIS — J0100 Acute maxillary sinusitis, unspecified: Secondary | ICD-10-CM | POA: Insufficient documentation

## 2018-12-28 DIAGNOSIS — J310 Chronic rhinitis: Secondary | ICD-10-CM | POA: Diagnosis not present

## 2018-12-28 DIAGNOSIS — Z91018 Allergy to other foods: Secondary | ICD-10-CM | POA: Insufficient documentation

## 2018-12-28 DIAGNOSIS — J320 Chronic maxillary sinusitis: Secondary | ICD-10-CM | POA: Diagnosis not present

## 2018-12-28 DIAGNOSIS — D803 Selective deficiency of immunoglobulin G [IgG] subclasses: Secondary | ICD-10-CM | POA: Diagnosis not present

## 2018-12-28 MED ORDER — AZELASTINE HCL 0.1 % NA SOLN: NASAL | 5 refills | Status: DC

## 2018-12-28 MED ORDER — METHYLPREDNISOLONE ACETATE 40 MG/ML IJ SUSP
40.0000 mg | Freq: Once | INTRAMUSCULAR | Status: AC
  Administered 2018-12-29: 40 mg via INTRAMUSCULAR

## 2018-12-28 NOTE — Assessment & Plan Note (Signed)
Gastrointestinal symptoms, uncertain etiology. Skin tests to select food allergens were negative today. The negative predictive value of food allergen skin testing is excellent (approximately 95%). While this does not appear to be an IgE mediated issue, skin testing does not rule out food intolerances or cell-mediated enteropathies which may lend to GI symptoms. These etiologies are suggested when elimination of the responsible food leads to symptom resolution and re-introduction of the food is followed by the return of symptoms.   The patient has been encouraged to keep a careful symptom/food journal and eliminate any food suspected of correlating with symptoms. Should symptoms concerning for anaphylaxis arise, 911 is to be called immediately.  If GI symptoms persist or progress, further evaluation by gastroenterology may be warranted.

## 2018-12-28 NOTE — Patient Instructions (Addendum)
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  If lab work has not been done recently, we will check immunoglobulin levels and adjust Gammagard dose if needed.  Chronic rhinitis All seasonal and perennial aeroallergen skin tests are negative despite a positive histamine control.  Intranasal steroids, intranasal antihistamines, and first generation antihistamines are effective for symptoms associated with non-allergic rhinitis, whereas second generation antihistamines such as cetirizine (Zyrtec), loratadine (Claritin) and fexofenadine (Allegra) have been found to be ineffective for this condition.  A prescription has been provided for azelastine nasal spray, one spray per nostril 1-2 times daily as needed. Proper nasal spray technique has been discussed and demonstrated.  If needed, may add fluticasone nasal spray, 1 spray per nostril twice daily.  Nasal saline lavage (NeilMed) has been recommended as needed and prior to medicated nasal sprays along with instructions for proper administration.  For thick post nasal drainage, add guaifenesin 600 mg (Mucinex)  twice daily as needed with adequate hydration as discussed.  If this problem persists or progresses, consider evaluation by an otolaryngologist.  Acute maxillary sinusitis  Depo-Medrol 40 mg was administered in the office.  Treatment plan as outlined above for allergic rhinitis.  The patient will contact our office if symptoms persist, progress, or if she becomes febrile.  Food intolerance/GI symptoms Gastrointestinal symptoms, uncertain etiology. Skin tests to select food allergens were negative today. The negative predictive value of food allergen skin testing is excellent (approximately 95%). While this does not appear to be an IgE mediated issue, skin testing does not rule out food intolerances or cell-mediated enteropathies which may lend to GI symptoms. These etiologies are suggested  when elimination of the responsible food leads to symptom resolution and re-introduction of the food is followed by the return of symptoms.   The patient has been encouraged to keep a careful symptom/food journal and eliminate any food suspected of correlating with symptoms. Should symptoms concerning for anaphylaxis arise, 911 is to be called immediately.  If GI symptoms persist or progress, further evaluation by gastroenterology may be warranted.   Return in about 4 months (around 04/30/2019), or if symptoms worsen or fail to improve.

## 2018-12-28 NOTE — Assessment & Plan Note (Addendum)
All seasonal and perennial aeroallergen skin tests are negative despite a positive histamine control.  Intranasal steroids, intranasal antihistamines, and first generation antihistamines are effective for symptoms associated with non-allergic rhinitis, whereas second generation antihistamines such as cetirizine (Zyrtec), loratadine (Claritin) and fexofenadine (Allegra) have been found to be ineffective for this condition.  A prescription has been provided for azelastine nasal spray, one spray per nostril 1-2 times daily as needed. Proper nasal spray technique has been discussed and demonstrated.  If needed, may add fluticasone nasal spray, 1 spray per nostril twice daily.  Nasal saline lavage (NeilMed) has been recommended as needed and prior to medicated nasal sprays along with instructions for proper administration.  For thick post nasal drainage, add guaifenesin 600 mg (Mucinex)  twice daily as needed with adequate hydration as discussed.  If this problem persists or progresses, consider evaluation by an otolaryngologist.

## 2018-12-28 NOTE — Assessment & Plan Note (Signed)
   Depo-Medrol 40 mg was administered in the office.  Treatment plan as outlined above for allergic rhinitis.  The patient will contact our office if symptoms persist, progress, or if she becomes febrile.

## 2018-12-28 NOTE — Progress Notes (Signed)
New Patient Note  RE: Megan Brewer MRN: [id number] DOB: [birth year] Date of Office Visit: 12/28/2018  Referring provider: [name], NP Primary care provider: [name], NP  Chief Complaint: Allergic Rhinitis    History of present illness: Megan Brewer is a 73 y.o. female seen today in consultation requested by Sherrie Mustache, NP.  She reports that for many years she has been on immunoglobulin G replacement with Gammagard infusion every 3 weeks.  She recently moved from [facility] to [facility] and therefore it is impractical to follow-up with her previous allergist/rheumatologist who has been prescribing the Gammagard.  She reports that prior to starting IgG replacement she "lived on antibiotics" for chronic sinusitis.  She reports that she currently has a sinus infection requiring antibiotics or systemic steroids once every 3 or 4 months.  She reports that if multiple rounds of antibiotics did not clear the sinus infection a Decadron injection typically brings about resolution of symptoms.  At baseline, she experiences nasal congestion, thick postnasal drainage, and some sinus pressure despite compliance with fluticasone nasal spray, 1 spray per nostril twice daily.  She currently is not using nasal saline irrigation.  No significant seasonal symptom variation has been noted nor have specific environmental triggers been identified.  She is requesting a corticosteroid injection today to help alleviate the sinus symptoms she has been experiencing recently. She also complains of untoward GI symptoms for most of her life.  Specifically, she experiences crampy abdominal pain, acid reflux, and belching.  She has limited her diet significantly for fear that certain foods may be contributing to the GI symptoms.  She does not experience concomitant urticaria, angioedema, or cardiopulmonary symptoms.  She had gastric bypass in 2004 2005 but states that her GI  symptoms preceded this procedure.  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  If lab work has not been done recently, we will check immunoglobulin levels and adjust Gammagard dose if needed.  Chronic rhinitis All seasonal and perennial aeroallergen skin tests are negative despite a positive histamine control.  Intranasal steroids, intranasal antihistamines, and first generation antihistamines are effective for symptoms associated with non-allergic rhinitis, whereas second generation antihistamines such as cetirizine (Zyrtec), loratadine (Claritin) and fexofenadine (Allegra) have been found to be ineffective for this condition.  A prescription has been provided for azelastine nasal spray, one spray per nostril 1-2 times daily as needed. Proper nasal spray technique has been discussed and demonstrated.  If needed, may add fluticasone nasal spray, 1 spray per nostril twice daily.  Nasal saline lavage (NeilMed) has been recommended as needed and prior to medicated nasal sprays along with instructions for proper administration.  For thick post nasal drainage, add guaifenesin 600 mg (Mucinex)  twice daily as needed with adequate hydration as discussed.  If this problem persists or progresses, consider evaluation by an otolaryngologist.  Acute maxillary sinusitis  Depo-Medrol 40 mg was administered in the office.  Treatment plan as outlined above for allergic rhinitis.  The patient will contact our office if symptoms persist, progress, or if she becomes febrile.  Food intolerance/GI symptoms Gastrointestinal symptoms, uncertain etiology. Skin tests to select food allergens were negative today. The negative predictive value of food allergen skin testing is excellent (approximately 95%). While this does not appear to be an IgE mediated issue, skin testing does not rule out food intolerances or cell-mediated enteropathies  which may lend to GI symptoms. These  etiologies are suggested when elimination of the responsible food leads to symptom resolution and re-introduction of the food is followed by the return of symptoms.   The patient has been encouraged to keep a careful symptom/food journal and eliminate any food suspected of correlating with symptoms. Should symptoms concerning for anaphylaxis arise, 911 is to be called immediately.  If GI symptoms persist or progress, further evaluation by gastroenterology may be warranted.   Meds ordered this encounter  Medications  . azelastine (ASTELIN) 0.1 % nasal spray    Sig: 1 spray in each nostril 1-2 times a daily as needed    Dispense:  30 mL    Refill:  5  . methylPREDNISolone acetate (DEPO-MEDROL) injection 40 mg    Diagnostics: Epicutaneous environmental testing: Negative despite a positive histamine control. Intradermal environmental testing: Negative. Food allergen skin testing: Negative despite a positive histamine control.    Physical examination: Blood pressure (!) 88/60, pulse 75, temperature 97.6 F (36.4 C), temperature source Oral, resp. rate 16, height 5\' 4"  (1.626 m), weight 120 lb (54.4 kg), SpO2 98 %.  General: Alert, interactive, in no acute distress. HEENT: TMs pearly gray, turbinates moderately edematous with thick discharge, post-pharynx unremarkable. Neck: Supple without lymphadenopathy. Lungs: Clear to auscultation without wheezing, rhonchi or rales. CV: Normal S1, S2 without murmurs. Abdomen: Nondistended, nontender. Skin: Warm and dry, without lesions or rashes. Extremities:  No clubbing, cyanosis or edema. Neuro:   Grossly intact.  Review of systems:  Review of systems negative except as noted in HPI / PMHx or noted below: Review of Systems  Constitutional: Negative.   HENT: Negative.   Eyes: Negative.   Respiratory: Negative.   Cardiovascular: Negative.   Gastrointestinal: Negative.   Genitourinary: Negative.    Musculoskeletal: Negative.   Skin: Negative.   Neurological: Negative.   Endo/Heme/Allergies: Negative.   Psychiatric/Behavioral: Negative.     Past medical history:  Past Medical History:  Diagnosis Date  . Arachnoiditis   . Bipolar disorder ([place])   . Chronic back pain   . Chronic pain   . Chronic post-thoracotomy pain   . CKD (chronic kidney disease)   . GERD (gastroesophageal reflux disease)   . Hypogammaglobulinemia ([place])   . Hypotension   . Hypothyroid   . 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 Lung cancer ([place]) 2011, 2014  . Memory loss   . Migraine   . Opioid abuse ([place])   . Osteoporosis   . Presence of neurostimulator   . Recurrent upper respiratory infection (URI)   . Restless legs   . Sleep apnea   . Tremor   . Urge incontinence   . Urticaria    long time ago, nothing recent    Past surgical history:  Past Surgical History:  Procedure Laterality Date  . ABDOMINAL HYSTERECTOMY    . CARPAL TUNNEL RELEASE    . CATARACT EXTRACTION Bilateral 2019  . CHOLECYSTECTOMY    . COLONOSCOPY  2015   [facility]  . CT LUNG SCREENING  2018  . DG  BONE DENSITY ([place] [facility])  2018  . DIAGNOSTIC MAMMOGRAM  2019  . GASTRIC BYPASS    . LUNG CANCER SURGERY  02/2010  . SPINAL CORD STIMULATOR IMPLANT      Family history: Family History  Problem Relation Age of Onset  . Hypertension Mother   . GER disease Mother   . Dementia Mother   . Osteoporosis Mother   . Arthritis Mother   . Parkinson's disease Father   .  Congestive Heart Failure Father   . Pneumonia Father   . Arthritis Father   . Dementia Father   . Asthma Sister   . Asthma Daughter     Social history: Social History   Socioeconomic History  . Marital status: Married    Spouse name: Not on file  . Number of children: Not on file  . Years of education: Not on file  . Highest education level: Not on file  Occupational History  . Not on file  Social Needs  . Financial resource strain: Not on file  .  Food insecurity:    Worry: Not on file    Inability: Not on file  . Transportation needs:    Medical: Not on file    Non-medical: Not on file  Tobacco Use  . Smoking status: Never Smoker  . Smokeless tobacco: Never Used  Substance and Sexual Activity  . Alcohol use: Yes    Comment: rare  . Drug use: Never  . Sexual activity: Not on file  Lifestyle  . Physical activity:    Days per week: Not on file    Minutes per session: Not on file  . Stress: Not on file  Relationships  . Social connections:    Talks on phone: Not on file    Gets together: Not on file    Attends religious service: Not on file    Active member of club or organization: Not on file    Attends meetings of clubs or organizations: Not on file    Relationship status: Not on file  . Intimate partner violence:    Fear of current or ex partner: Not on file    Emotionally abused: Not on file    Physically abused: Not on file    Forced sexual activity: Not on file  Other Topics Concern  . Not on file  Social History Narrative   Diet: None      Caffeine: coffee, tea, sodas less than or 1 daily.      Married, if yes what year: Yes, 1972      Do you live in a house, apartment, assisted living, condo, trailer, ect: Two story house       Pets: None      Current/Past profession: Teach      Exercise: None         Living Will: No   DNR: No   POA/HPOA: No      Functional Status:   Do you have difficulty bathing or dressing yourself? yes   Do you have difficulty preparing food or eating? yes   Do you have difficulty managing your medications? yes   Do you have difficulty managing your finances?yes   Do you have difficulty affording your medications? no   Environmental History: Patient lives in a 71 year old house with hardwood floors throughout, gas heat, and central air.  She is a never smoker without pets.  There is no known mold/water damage in the home.  Allergies as of 12/28/2018      Reactions    Butorphanol Tartrate Other (See Comments)   "like someone is brushing my brain with a brillo pad"   Tetracycline Nausea Only   Causes ringing in ears, dizziness, migraine, nausea   Corticosteroids Other (See Comments)   12/02/2016 interview by Melissa Montane: Oral dosing causes migraines/nausea/tinnitus. Prev tolerated IV and intranasal admin w/o difficulty.   Cefixime Other (See Comments)   Headache   Ciprofloxacin Other (See Comments)   Headache, dizziness,  ringing in the ears   Doxycycline Other (See Comments)   unknown   Gabapentin    Throat swells/closes   Isometheptene-dichloral-apap Other (See Comments)   unknown   Ketorolac    12/02/2016 interview by ZHY: Oral dosing prednisone/corticosteroids causes migraines/nausea/tinnitus. Prev tolerated IV and intranasal admin w/o difficulty.   Prednisone Other (See Comments)   Migraine, dizzy, ringing in ears-can take it IV 12/02/2016 interview by [name]: Oral prednisone dosing causes migraines/nausea/tinnitus. Prev tolerated IV and intranasal admin w/o difficulty.   Promethazine Other (See Comments)   causes severe abdominal pain when taken IV; however she can take PO or IM   Stadol [butorphanol]    Cerebral pain   Erythromycin Base Diarrhea, Itching   Severe diarrhea   Propoxyphene Nausea Only      Medication List       Accurate as of Dec 28, 2018  7:44 PM. If you have any questions, ask your nurse or doctor.        acetaminophen 650 MG CR tablet Commonly known as:  TYLENOL Take 1,300 mg by mouth every 8 (eight) hours as needed for pain or fever.   ALIGN PREBIOTIC-PROBIOTIC PO 1 by mouth daily   Amitiza 24 MCG capsule Generic drug:  lubiprostone Take 24 mcg by mouth as needed.   azelastine 0.1 % nasal spray Commonly known as:  ASTELIN 1 spray in each nostril 1-2 times a daily as needed Started by:  Edmonia Lynch, MD   [facility] Fast Pain Relief 845-65 MG Pack Generic drug:  Aspirin-Caffeine Take 1 Package by mouth as needed (up to  2 packets daily). Pain   Calcium Citrate 200 MG Tabs Take 950 mg by mouth daily.   Cambia 50 MG Pack Generic drug:  Diclofenac Potassium Take 1 Packet mixed in 2-4 tablespoonful of water without food. Max of 2 doses in 24 hours. Take as needed. RX'ed by Jackolyn Confer   CRAN-B-OTC PO Take by mouth. 1 daily   cycloSPORINE 0.05 % ophthalmic emulsion Commonly known as:  RESTASIS Place 1 drop into both eyes 2 (two) times daily.   EARACHE RELIEF OT Place in ear(s). PRN   Flonase 50 MCG/ACT nasal spray Generic drug:  fluticasone Place 1 spray into both nostrils 2 (two) times a day.   immune globulin (human) 10 g injection Commonly known as:  GAMMAGARD S/D LESS IGA Inject into the vein every 21 ( twenty-one) days.   lansoprazole 30 MG disintegrating tablet Commonly known as:  PREVACID SOLUTAB Take 30 mg by mouth 2 (two) times daily.   levothyroxine 75 MCG tablet Commonly known as:  SYNTHROID Take 75 mcg by mouth daily before breakfast.   magnesium oxide 400 MG tablet Commonly known as:  MAG-OX Take 400 mg by mouth daily.   Melatonin 5 MG Caps Take 5 mg by mouth at bedtime.   Mucus Relief 400 MG Tabs tablet Generic drug:  guaifenesin Take 400 mg by mouth as needed.   ondansetron 8 MG tablet Commonly known as:  ZOFRAN Take 8 mg by mouth every 8 (eight) hours as needed for nausea or vomiting.   OVER THE COUNTER MEDICATION Take 1 tablet by mouth daily. ADEK   PEPTO-BISMOL PO 1-2 daily as needed   potassium chloride SA 20 MEQ tablet Commonly known as:  K-DUR Take 40 mEq by mouth daily.   PREVAGEN PO 1 by mouth daily   pyridOXINE 100 MG tablet Commonly known as:  VITAMIN B-6 Take 100 mg by mouth daily.  rOPINIRole 0.5 MG tablet Commonly known as:  REQUIP 2 by mouth daily   sertraline 100 MG tablet Commonly known as:  ZOLOFT Take 100 mg by mouth 2 (two) times daily.   Suboxone 4-1 MG Film Generic drug:  Buprenorphine HCl-Naloxone HCl Place under the  tongue. 2 daily   sucralfate 1 GM/10ML suspension Commonly known as:  CARAFATE Take 200 mg by mouth 3 (three) times daily as needed. Mouth sores   tiZANidine 2 MG tablet Commonly known as:  ZANAFLEX Take 2 mg by mouth 3 (three) times daily.   torsemide 20 MG tablet Commonly known as:  DEMADEX Take 20 mg by mouth daily.   vitamin B-12 1000 MCG tablet Commonly known as:  CYANOCOBALAMIN Take 1,000 mcg by mouth daily.       Known medication allergies: Allergies  Allergen Reactions  . Butorphanol Tartrate Other (See Comments)    "like someone is brushing my brain with a brillo pad"  . Tetracycline Nausea Only    Causes ringing in ears, dizziness, migraine, nausea  . Corticosteroids Other (See Comments)    12/02/2016 interview by Melissa Montane: Oral dosing causes migraines/nausea/tinnitus. Prev tolerated IV and intranasal admin w/o difficulty.  . Cefixime Other (See Comments)    Headache   . Ciprofloxacin Other (See Comments)    Headache, dizziness, ringing in the ears  . Doxycycline Other (See Comments)    unknown  . Gabapentin     Throat swells/closes  . Isometheptene-Dichloral-Apap Other (See Comments)    unknown  . Ketorolac     12/02/2016 interview by BSJ: Oral dosing prednisone/corticosteroids causes migraines/nausea/tinnitus. Prev tolerated IV and intranasal admin w/o difficulty.  . Prednisone Other (See Comments)    Migraine, dizzy, ringing in ears-can take it IV 12/02/2016 interview by [name]: Oral prednisone dosing causes migraines/nausea/tinnitus. Prev tolerated IV and intranasal admin w/o difficulty.  . Promethazine Other (See Comments)    causes severe abdominal pain when taken IV; however she can take PO or IM  . Stadol [Butorphanol]     Cerebral pain  . Erythromycin Base Diarrhea and Itching    Severe diarrhea  . Propoxyphene Nausea Only    I appreciate the opportunity to take part in Jeaneane's care. Please do not hesitate to contact me with questions.  Sincerely,    R. Edgar Frisk, MD

## 2018-12-28 NOTE — Assessment & Plan Note (Deleted)
Gastrointestinal symptoms, uncertain etiology. Skin tests to select food allergens were negative today. The negative predictive value of food allergen skin testing is excellent (approximately 95%). While this does not appear to be an IgE mediated issue, skin testing does not rule out food intolerances or cell-mediated enteropathies which may lend to GI symptoms. These etiologies are suggested when elimination of the responsible food leads to symptom resolution and re-introduction of the food is followed by the return of symptoms.   The patient has been encouraged to keep a careful symptom/food journal and eliminate any food suspected of correlating with symptoms. Should symptoms concerning for anaphylaxis arise, 911 is to be called immediately.  If GI symptoms persist or progress, further evaluation by gastroenterology may be warranted.

## 2018-12-28 NOTE — Assessment & Plan Note (Deleted)
   Treatment plan as outlined above for allergic rhinitis.

## 2018-12-28 NOTE — Assessment & Plan Note (Signed)
   We will obtain records and continue Gammagard treatments.  If lab work has not been done recently, we will check immunoglobulin levels and adjust Gammagard dose if needed.

## 2019-01-03 ENCOUNTER — Telehealth: Payer: Self-pay | Admitting: *Deleted

## 2019-01-03 NOTE — Telephone Encounter (Signed)
Great. Thanks, Tammy.

## 2019-01-03 NOTE — Telephone Encounter (Signed)
I reached out to patient to discuss her Gammagard SD infusions and switching over to Dr Verlin Fester.  She has been getting shipments from [place] and in-home infusions since she has already moved here.  She has current PA approval with insurance and I advised her that would not change but I will reach out to [facility] to advise refills and future approvals to come from our office.  Patient will continue her in home infusions with [facility] sending her the medications.  I did reach out to [facility] regarding needing notes and labs prior to beginning infusions for future reapprovals and in event she has any changes in her insurance plans.

## 2019-01-05 ENCOUNTER — Other Ambulatory Visit: Payer: Self-pay

## 2019-01-05 ENCOUNTER — Ambulatory Visit (INDEPENDENT_AMBULATORY_CARE_PROVIDER_SITE_OTHER): Payer: Medicare Other | Admitting: Gastroenterology

## 2019-01-05 DIAGNOSIS — Z86010 Personal history of colonic polyps: Secondary | ICD-10-CM

## 2019-01-05 DIAGNOSIS — K5900 Constipation, unspecified: Secondary | ICD-10-CM

## 2019-01-05 DIAGNOSIS — K219 Gastro-esophageal reflux disease without esophagitis: Secondary | ICD-10-CM

## 2019-01-05 DIAGNOSIS — R110 Nausea: Secondary | ICD-10-CM | POA: Diagnosis not present

## 2019-01-05 DIAGNOSIS — Z9884 Bariatric surgery status: Secondary | ICD-10-CM | POA: Diagnosis not present

## 2019-01-05 MED ORDER — ONDANSETRON 8 MG PO TBDP: 8.0000 mg | ORAL_TABLET | Freq: Three times a day (TID) | ORAL | 1 refills | Status: DC | PRN

## 2019-01-05 NOTE — Progress Notes (Signed)
Virtual Visit via Telephone Note  I connected with Megan Brewer on 01/05/19 at 11:00 AM EDT by telephone and verified that I am speaking with the correct person using two identifiers.   I discussed the limitations, risks, security and privacy concerns of performing an evaluation and management service by telephone and the availability of in person appointments. I also discussed with the patient that there may be a patient responsible charge related to this service. The patient expressed understanding and agreed to proceed.  THIS ENCOUNTER IS A VIRTUAL VISIT DUE TO COVID-19 - PATIENT WAS NOT SEEN IN THE OFFICE. PATIENT HAS CONSENTED TO VIRTUAL VISIT / TELEMEDICINE VISIT USING DOXIMITY   Location of patient: home Location of provider: office Name of referring provider: Sherrie Mustache NP Persons participating: myself, patient 45 minutes spent with patient on this call   HPI : 73 y/o female with a history of lung cancer in remission. IgG deficiency, history of gastric bypass, chronic pain on narcotics and NSAIDs, GERD, referred for chronic nausea, GERD, bowel changes by Sherrie Mustache NP.  She has numerous GI tract symptoms for which she wishes to discuss today.    GERD / reflux - she thinks ongoing for at least 40 years. She has pyrosis / heartburn which bothers her. She also has some regurgitation, at night when sleeping, despite head of bed elevated. She has a sense of need to belch to find relief when swallowing solids or liquids occasionally, feels this sensation in her lower chest. She is taking prevacid solutab twice daily 30mg . She thinks it does help compared to not taking it, but she has ongoing breakthrough of her symptoms. She also has a lot of nausea, which comes and goes, sometimes can be severe. She is using Carafate PRN, sometimes helps. She has Zofran as well, helps when she takes it but does not take it routinely. She is not having much vomiting, but more so than previous  baseline. Sometimes eating makes symptoms worse. She has tea and toast for breakfast, snack for lunch such as yogurt / soup, snack in afternoon, dinner portion about 1/4th of normal consumption. She has a decreased appetite as she feels worse with more food. She has lost about 40 lbs since last Sept / October without trying. She had a history of gastric bypass in 2006 or so.   She has had issues with constipation, which she thinks makes her stomach feel worse. She has cramps in mid to lower abdomen which are hard to localize. She thinks these are usually relieved with a BM. She has a BM once every 3 days or so, sometimes every day. She is taking Align and Amitiza 39mcg once daily at night, she occasionally takes it twice. With twice daily dosing of Amitiza she has severe diarrhea which takes some time to get over it. She denies any blood in the stools. If she takes Amitiza once daily she has a BM once every 3 days.  She takes suboxone for the past year, she takes this for chronic pain following her lung cancer surgery. She has chronic pain in her chest with breathin. She does take BC powder / goody powder for other joint pains. Takes it every day for the most part and "grew up on Goodies".  She has had a prior endoscopy and colonoscopy in [place] in 2015 as outlined.   Colonoscopy 02/01/2014 - 7mm polyp ascending colon, 2 sigmoid polyps removed - small - told to repeat in 5 years, prep good  EGD 02/01/2014 - roux-en-Y, healthy in appearance, normal otherwise  No FH of gastric, esophagus, colon cancer.  CT scan 10/25/18 - no evidence of mass lesions, h/o cholecystectomy. Her lung cancer is reportedly in remission.   Moved to [place] since [place] 2019.   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[place])    Lung cancer ([place]) 2011, 2014   Memory loss    Migraine    Opioid abuse ([place])    Osteoporosis    Presence of neurostimulator    Recurrent upper respiratory infection (URI)    Restless legs    Sleep apnea    Tremor    Urge incontinence    Urticaria    long time ago, nothing recent     Past Surgical History:  Procedure Laterality Date   ABDOMINAL HYSTERECTOMY     CARPAL TUNNEL RELEASE     CATARACT EXTRACTION Bilateral 2019   CHOLECYSTECTOMY     COLONOSCOPY  2015   [facility]   CT LUNG SCREENING  2018   DG  BONE DENSITY ([facility])  2018   DIAGNOSTIC MAMMOGRAM  2019   GASTRIC BYPASS     LUNG CANCER SURGERY  02/2010   SPINAL CORD STIMULATOR IMPLANT     Family History  Problem Relation Age of Onset   Hypertension Mother    GER disease Mother    Dementia Mother    Osteoporosis Mother    Arthritis Mother    Parkinson's disease Father    Congestive Heart Failure Father    Pneumonia Father    Arthritis Father    Dementia Father    Asthma Sister    Asthma Daughter    Social History   Tobacco Use   Smoking status: Never Smoker   Smokeless tobacco: Never Used  Substance Use Topics   Alcohol use: Yes    Comment: rare   Drug use: Never   Current Outpatient Medications  Medication Sig Dispense Refill   acetaminophen (TYLENOL) 650 MG CR tablet Take 1,300 mg by mouth every 8 (eight) hours as needed for pain or fever.      Apoaequorin (PREVAGEN PO) 1 by mouth daily     Aspirin-Caffeine (BC FAST PAIN RELIEF) 845-65 MG PACK Take 1 Package by mouth as needed (up to 2 packets daily). Pain     azelastine (ASTELIN) 0.1 % nasal spray 1 spray in each nostril 1-2 times a daily as needed 30 mL 5   Bacillus Coagulans-Inulin (ALIGN PREBIOTIC-PROBIOTIC PO) 1 by mouth daily     Bismuth Subsalicylate (PEPTO-BISMOL PO) 1-2 daily as needed        Buprenorphine HCl-Naloxone HCl (SUBOXONE) 4-1 MG FILM Place under the tongue. 2 daily     Calcium Citrate 200 MG TABS Take 950 mg by mouth daily.      cycloSPORINE (RESTASIS) 0.05 % ophthalmic emulsion Place 1 drop into both eyes 2 (two) times daily.     Diclofenac Potassium (CAMBIA) 50 MG PACK Take 1 Packet mixed in 2-4 tablespoonful of water without food. Max of 2 doses in 24 hours. Take as needed. RX'ed by Jackolyn Confer     fluticasone [facility]) 50  MCG/ACT nasal spray Place 1 spray into both nostrils 2 (two) times a day.     guaifenesin (MUCUS RELIEF) 400 MG TABS tablet Take 400 mg by mouth as needed.      Homeopathic Products (EARACHE RELIEF OT) Place in ear(s). PRN     immune globulin, human, (GAMMAGARD S/D LESS IGA) 10 g injection Inject into the vein every 21 ( twenty-one) days.      lansoprazole (PREVACID SOLUTAB) 30 MG disintegrating tablet Take 30 mg by mouth 2 (two) times daily.      levothyroxine (SYNTHROID, LEVOTHROID) 75 MCG tablet Take 75 mcg by mouth daily before breakfast.      lubiprostone (AMITIZA) 24 MCG capsule Take 24 mcg by mouth as needed.      magnesium oxide (MAG-OX) 400 MG tablet Take 400 mg by mouth daily.      Melatonin 5 MG CAPS Take 5 mg by mouth at bedtime.      Misc Natural Products (CRAN-B-OTC PO) Take by mouth. 1 daily     ondansetron (ZOFRAN) 8 MG tablet Take 8 mg by mouth every 8 (eight) hours as needed for nausea or vomiting.      OVER THE COUNTER MEDICATION Take 1 tablet by mouth daily. ADEK     potassium chloride SA (K-DUR,KLOR-CON) 20 MEQ tablet Take 40 mEq by mouth daily.      pyridOXINE (VITAMIN B-6) 100 MG tablet Take 100 mg by mouth daily.     rOPINIRole (REQUIP) 0.5 MG tablet 2 by mouth daily     sertraline (ZOLOFT) 100 MG tablet Take 100 mg by mouth 2 (two) times daily.      sucralfate (CARAFATE) 1 GM/10ML suspension Take 200 mg by mouth 3 (three) times daily as needed. Mouth sores     tiZANidine (ZANAFLEX) 2 MG tablet Take 2  mg by mouth 3 (three) times daily.     torsemide (DEMADEX) 20 MG tablet Take 20 mg by mouth daily.      vitamin B-12 (CYANOCOBALAMIN) 1000 MCG tablet Take 1,000 mcg by mouth daily.      No current facility-administered medications for this visit.    Allergies  Allergen Reactions   Butorphanol Tartrate Other (See Comments)    "like someone is brushing my brain with a brillo pad"   Tetracycline Nausea Only    Causes ringing in ears, dizziness, migraine, nausea   Corticosteroids Other (See Comments)    12/02/2016 interview by [facility]: Oral dosing causes migraines/nausea/tinnitus. Prev tolerated IV and intranasal admin w/o difficulty.   Cefixime Other (See Comments)    Headache    Ciprofloxacin Other (See Comments)    Headache, dizziness, ringing in the ears   Doxycycline Other (See Comments)    unknown   Gabapentin     Throat swells/closes   Isometheptene-Dichloral-Apap Other (See Comments)    unknown   Ketorolac     12/02/2016 interview by [facility]: Oral dosing prednisone/corticosteroids causes migraines/nausea/tinnitus. Prev tolerated IV and intranasal admin w/o difficulty.   Prednisone Other (See Comments)    Migraine, dizzy, ringing in ears-can take it IV 12/02/2016 interview by [name]: Oral prednisone dosing causes migraines/nausea/tinnitus. Prev tolerated IV and intranasal admin w/o difficulty.   Promethazine Other (See Comments)    causes severe abdominal pain when taken IV; however she can take PO or IM   Stadol [Butorphanol]     Cerebral pain   Erythromycin Base Diarrhea and Itching    Severe diarrhea   Propoxyphene Nausea Only     Review  of Systems: All systems reviewed and negative except where noted in HPI.     Labs reviewed in Care everywhere  Physical Exam: NA  ASSESSMENT AND PLAN: 73 y/o female with medical history as outlined above here for new patient assessment of the following:  GERD / chronic nausea / history of gastric bypass - she has chronic  reflux with ongoing severe despite despite prevacid solutab BID. Also with chronic nausea and postprandial discomfort, weight loss, in the setting of significant NSAID use with history of gastric bypass. Recent CT scan at [facility] for surveillance of her lung cancer is negative, no evidence of residual / recurrent disease which is reassuring. Her chronic narcotics and routine NSAID use certainly could be contributing to her nausea, I'm concerned about her risk for PUD with her NSAID use. Recommend she completely stop Goody powders and her NSAIDs. She can take carafate a few times per day every day to see if that helps, and will give her some standing Zofran to take BID to stay on top of the nausea. While her CT and labs are reassuring, given her weight loss recommend EGD to further evaluate, assess for marginal ulcer, etc. Following discussion of risks / benefits of EGD and anesthesia she wanted to proceed with EGD. Further recommendations pending the results and her course.   Chronic constipation / history of colon polyps - chronic constipation, likely made worse by her narcotic use. Use of twice daily Amitiza is too strong, however once daily dose not seem strong enough. Discussed options with her. Will add Miralax to the Amitiza, should take it once to twice per day, will see if that can provide more regularity without the loose stools. If this does not work could switch to [employer] that has better dosing options. I otherwise offered her a colonoscopy for surveillance of polyps and to evaluate these symptoms. She wishes to do it at some point but with her upper symptoms unclear how she would tolerate a prep right now. Will get her upper tract symptoms better controlled and then proceed with colonoscopy. She agreed.   Melvin Cellar, MD [facility]  CC: [name], NP

## 2019-01-05 NOTE — Patient Instructions (Addendum)
We have sent the following medications to your pharmacy for you to pick up at your convenience:  Zofran   You have been scheduled for an endoscopy. Please follow written instructions given to you at your visit today. If you use inhalers (even only as needed), please bring them with you on the day of your procedure. Your physician has requested that you go to www.startemmi.com and enter the access code given to you at your visit today. This web site gives a general overview about your procedure. However, you should still follow specific instructions given to you by our office regarding your preparation for the procedure.   If you are age 71 or older, your body mass index should be between 23-30. Your There is no height or weight on file to calculate BMI. If this is out of the aforementioned range listed, please consider follow up with your Primary Care Provider.  If you are age 91 or younger, your body mass index should be between 19-25. Your There is no height or weight on file to calculate BMI. If this is out of the aformentioned range listed, please consider follow up with your Primary Care Provider.    Thank you for choosing me and [facility].  Dr.Armbruster

## 2019-01-11 LAB — IGG, IGA, IGM
IgA/Immunoglobulin A, Serum: 111 mg/dL (ref 87–352)
IgG (Immunoglobin G), Serum: 1340 mg/dL (ref 586–1602)
IgM (Immunoglobulin M), Srm: 92 mg/dL (ref 26–217)

## 2019-01-23 ENCOUNTER — Telehealth: Payer: Self-pay | Admitting: *Deleted

## 2019-01-23 NOTE — Telephone Encounter (Signed)
Message left

## 2019-01-24 ENCOUNTER — Ambulatory Visit: Payer: Medicare Other | Admitting: Nurse Practitioner

## 2019-01-24 ENCOUNTER — Telehealth: Payer: Self-pay | Admitting: *Deleted

## 2019-01-24 NOTE — Telephone Encounter (Signed)

## 2019-01-25 ENCOUNTER — Encounter: Payer: Self-pay | Admitting: Gastroenterology

## 2019-01-25 ENCOUNTER — Ambulatory Visit (AMBULATORY_SURGERY_CENTER): Payer: Medicare Other | Admitting: Gastroenterology

## 2019-01-25 ENCOUNTER — Other Ambulatory Visit: Payer: Self-pay

## 2019-01-25 VITALS — BP 112/53 | HR 71 | Temp 98.5°F | Resp 9 | Ht 64.0 in | Wt 120.0 lb

## 2019-01-25 DIAGNOSIS — R1013 Epigastric pain: Secondary | ICD-10-CM | POA: Diagnosis not present

## 2019-01-25 DIAGNOSIS — R110 Nausea: Secondary | ICD-10-CM | POA: Diagnosis not present

## 2019-01-25 DIAGNOSIS — K219 Gastro-esophageal reflux disease without esophagitis: Secondary | ICD-10-CM

## 2019-01-25 MED ORDER — DEXLANSOPRAZOLE 60 MG PO CPDR: 60.0000 mg | DELAYED_RELEASE_CAPSULE | Freq: Every day | ORAL | 3 refills | Status: DC

## 2019-01-25 MED ORDER — SODIUM CHLORIDE 0.9 % IV SOLN: 500.0000 mL | Freq: Once | INTRAVENOUS | Status: DC

## 2019-01-25 NOTE — Progress Notes (Signed)
Megan Brewer , CMA- Temp Judy Branson, CMA- Vitals  

## 2019-01-25 NOTE — Patient Instructions (Signed)
Discharge instructions given. Handout on Eshopagitis. Dexilant prescription sent to pharmacy. Resume previous medications. YOU HAD AN ENDOSCOPIC PROCEDURE TODAY AT [facility]:   Refer to the procedure report that was given to you for any specific questions about what was found during the examination.  If the procedure report does not answer your questions, please call your gastroenterologist to clarify.  If you requested that your care partner not be given the details of your procedure findings, then the procedure report has been included in a sealed envelope for you to review at your convenience later.  YOU SHOULD EXPECT: Some feelings of bloating in the abdomen. Passage of more gas than usual.  Walking can help get rid of the air that was put into your GI tract during the procedure and reduce the bloating. If you had a lower endoscopy (such as a colonoscopy or flexible sigmoidoscopy) you may notice spotting of blood in your stool or on the toilet paper. If you underwent a bowel prep for your procedure, you may not have a normal bowel movement for a few days.  Please Note:  You might notice some irritation and congestion in your nose or some drainage.  This is from the oxygen used during your procedure.  There is no need for concern and it should clear up in a day or so.  SYMPTOMS TO REPORT IMMEDIATELY:    Following upper endoscopy (EGD)  Vomiting of blood or coffee ground material  New chest pain or pain under the shoulder blades  Painful or persistently difficult swallowing  New shortness of breath  Fever of 100F or higher  Black, tarry-looking stools  For urgent or emergent issues, a gastroenterologist can be reached at any hour by calling [phone].   DIET:  We do recommend a small meal at first, but then you may proceed to your regular diet.  Drink plenty of fluids but you should avoid alcoholic beverages for 24 hours.  ACTIVITY:  You should plan to take it  easy for the rest of today and you should NOT DRIVE or use heavy machinery until tomorrow (because of the sedation medicines used during the test).    FOLLOW UP: Our staff will call the number listed on your records 48-72 hours following your procedure to check on you and address any questions or concerns that you may have regarding the information given to you following your procedure. If we do not reach you, we will leave a message.  We will attempt to reach you two times.  During this call, we will ask if you have developed any symptoms of COVID 19. If you develop any symptoms (ie: fever, flu-like symptoms, shortness of breath, cough etc.) before then, please call [phone].  If you test positive for Covid 19 in the 2 weeks post procedure, please call and report this information to [place].    If any biopsies were taken you will be contacted by phone or by letter within the next 1-3 weeks.  Please call us at [phone] if you have not heard about the biopsies in 3 weeks.    SIGNATURES/CONFIDENTIALITY: You and/or your care partner have signed paperwork which will be entered into your electronic medical record.  These signatures attest to the fact that that the information above on your After Visit Summary has been reviewed and is understood.  Full responsibility of the confidentiality of this discharge information lies with you and/or your care-partner.

## 2019-01-25 NOTE — Progress Notes (Signed)
To PACU, VSS. Report to [email or web address] 

## 2019-01-25 NOTE — Op Note (Signed)
[facility] Patient Name: Megan Brewer Procedure Date: 01/25/2019 10:36 AM MRN: [id number] Endoscopist: Remo Lipps P. Havery Moros , MD Age: 73 Referring MD:  Date of Birth: Jul 29, 1946 Gender: Female Account #: [id number] Procedure:                Upper GI endoscopy Indications:              Epigastric abdominal pain, Follow-up of                            gastro-esophageal reflux disease, Nausea, history                            of NSAID use, on prevacid solutab and carafate Medicines:                Monitored Anesthesia Care Procedure:                Pre-Anesthesia Assessment:                           - Prior to the procedure, a History and Physical                            was performed, and patient medications and                            allergies were reviewed. The patient's tolerance of                            previous anesthesia was also reviewed. The risks                            and benefits of the procedure and the sedation                            options and risks were discussed with the patient.                            All questions were answered, and informed consent                            was obtained. Prior Anticoagulants: The patient has                            taken no previous anticoagulant or antiplatelet                            agents. ASA Grade Assessment: III - A patient with                            severe systemic disease. After reviewing the risks                            and benefits, the patient was deemed in  satisfactory condition to undergo the procedure.                           After obtaining informed consent, the endoscope was                            passed under direct vision. Throughout the                            procedure, the patient's blood pressure, pulse, and                            oxygen saturations were monitored continuously. The                            Endoscope  was introduced through the mouth, and                            advanced to the jejunum. The upper GI endoscopy was                            accomplished without difficulty. The patient                            tolerated the procedure well. Scope In: Scope Out: Findings:                 Esophagogastric landmarks were identified: the                            Z-line was found at 37 cm, the gastroesophageal                            junction was found at 37 cm and the upper extent of                            the gastric folds was found at 37 cm from the                            incisors.                           Esophagitis was found 37 cm from the incisors,                            moderate, with adherent old heme.                           There was a sharply angulated turn in the lower                            esophagus into the gastric pouch. The exam of the  esophagus was otherwise normal.                           Evidence of a Roux-en-Y gastrojejunostomy was                            found. The gastrojejunal anastomosis was                            characterized by an intact staple, mostly buried,                            without any obvious ulceration.                           Mildly erythematous mucosa was found in the gastric                            pouch. Biopsies were taken with a cold forceps for                            histology. The gastric pouch was small.                           The examined small bowel limb was normal. Complications:            No immediate complications. Estimated blood loss:                            Minimal. Estimated Blood Loss:     Estimated blood loss was minimal. Impression:               - Esophagogastric landmarks identified.                           - Esophagitis with adherent heme in the lower                            esophagus                           - Angulated turn from the  distal esophagus and into                            the gastric pouch                           - Roux-en-Y gastrojejunostomy with gastrojejunal                            anastomosis characterized by a staple which did not                            appear to be causing any problems.                           -  Erythematous mucosa in the gastric pouch, which                            was small. Biopsied to rule out H pylori.                           - Normal examined jejunum. Recommendation:           - Patient has a contact number available for                            emergencies. The signs and symptoms of potential                            delayed complications were discussed with the                            patient. Return to normal activities tomorrow.                            Written discharge instructions were provided to the                            patient.                           - Resume previous diet.                           - Continue present medications.                           - Await pathology results.                           - Please avoid all NSAIDs                           - Continue Carafate 10cc po q 6 hours for                            esophagitis                           - Change prevacid solutab to Dexilant 60mg  once                            daily                           - Gasviscon PRN for breakthrough reflux Remo Lipps P. Armbruster, MD 01/25/2019 11:21:31 AM This report has been signed electronically.

## 2019-01-25 NOTE — Progress Notes (Signed)
Called to room to assist during endoscopic procedure.  Patient ID and intended procedure confirmed with present staff. Received instructions for my participation in the procedure from the performing physician.  

## 2019-01-26 ENCOUNTER — Ambulatory Visit: Payer: Medicare Other | Admitting: Nurse Practitioner

## 2019-01-27 ENCOUNTER — Other Ambulatory Visit: Payer: Self-pay

## 2019-01-27 ENCOUNTER — Encounter: Payer: Self-pay | Admitting: Nurse Practitioner

## 2019-01-27 ENCOUNTER — Telehealth: Payer: Self-pay | Admitting: *Deleted

## 2019-01-27 ENCOUNTER — Ambulatory Visit: Payer: Medicare Other | Admitting: Nurse Practitioner

## 2019-01-27 VITALS — BP 118/62 | HR 65 | Temp 97.9°F | Ht 64.0 in | Wt 114.0 lb

## 2019-01-27 DIAGNOSIS — R5381 Other malaise: Secondary | ICD-10-CM | POA: Diagnosis not present

## 2019-01-27 DIAGNOSIS — C3490 Malignant neoplasm of unspecified part of unspecified bronchus or lung: Secondary | ICD-10-CM

## 2019-01-27 DIAGNOSIS — R634 Abnormal weight loss: Secondary | ICD-10-CM | POA: Diagnosis not present

## 2019-01-27 DIAGNOSIS — M810 Age-related osteoporosis without current pathological fracture: Secondary | ICD-10-CM | POA: Insufficient documentation

## 2019-01-27 DIAGNOSIS — E039 Hypothyroidism, unspecified: Secondary | ICD-10-CM | POA: Diagnosis not present

## 2019-01-27 DIAGNOSIS — K209 Esophagitis, unspecified without bleeding: Secondary | ICD-10-CM | POA: Insufficient documentation

## 2019-01-27 DIAGNOSIS — H04123 Dry eye syndrome of bilateral lacrimal glands: Secondary | ICD-10-CM | POA: Diagnosis not present

## 2019-01-27 DIAGNOSIS — R600 Localized edema: Secondary | ICD-10-CM

## 2019-01-27 DIAGNOSIS — G43919 Migraine, unspecified, intractable, without status migrainosus: Secondary | ICD-10-CM

## 2019-01-27 DIAGNOSIS — M533 Sacrococcygeal disorders, not elsewhere classified: Secondary | ICD-10-CM

## 2019-01-27 MED ORDER — LEVOTHYROXINE SODIUM 75 MCG PO TABS: 75.0000 ug | ORAL_TABLET | Freq: Every day | ORAL | 1 refills | Status: DC

## 2019-01-27 NOTE — Patient Instructions (Addendum)
To use baby shampoo to help with crust for eyes.  aquaphor for skin  Turning frequently, cushions to help coccyx pain  Increase calories to help with weight loss, will provide dietitian consult on ideas for proper nutrition

## 2019-01-27 NOTE — Progress Notes (Signed)
Careteam: Patient Care Team: [name], NP as PCP - General (Geriatric Medicine)  Advanced Directive information Does Patient Have a Medical Advance Directive?: No, Would patient like information on creating a medical advance directive?: No - Patient declined  Allergies  Allergen Reactions  . Butorphanol Tartrate Other (See Comments)    "like someone is brushing my brain with a brillo pad"  . Tetracycline Nausea Only    Causes ringing in ears, dizziness, migraine, nausea  . Corticosteroids Other (See Comments)    12/02/2016 interview by Melissa Montane: Oral dosing causes migraines/nausea/tinnitus. Prev tolerated IV and intranasal admin w/o difficulty.  . Cefixime Other (See Comments)    Headache   . Ciprofloxacin Other (See Comments)    Headache, dizziness, ringing in the ears  . Doxycycline Other (See Comments)    unknown  . Gabapentin     Throat swells/closes  . Isometheptene-Dichloral-Apap Other (See Comments)    unknown  . Ketorolac     12/02/2016 interview by DXA: Oral dosing prednisone/corticosteroids causes migraines/nausea/tinnitus. Prev tolerated IV and intranasal admin w/o difficulty.  . Prednisone Other (See Comments)    Migraine, dizzy, ringing in ears-can take it IV 12/02/2016 interview by [name]: Oral prednisone dosing causes migraines/nausea/tinnitus. Prev tolerated IV and intranasal admin w/o difficulty.  . Promethazine Other (See Comments)    causes severe abdominal pain when taken IV; however she can take PO or IM  . Stadol [Butorphanol]     Cerebral pain  . Erythromycin Base Diarrhea and Itching    Severe diarrhea  . Propoxyphene Nausea Only    Chief Complaint  Patient presents with  . Medical Management of Chronic Issues    1 month follow-up   . Medication Refill    Renew Levothyroxine   . Quality Metric Gaps    Discuss need for colonoscopy and mammogram     HPI: Patient is a 73 y.o. female seen in the office today for follow up.  Has a few  concerns today.   Takes several eye drops for dry eye, gets crusted in her eye lashes that are hard to remove. Questions what she can do to have it stop from sticking to her skin to get it off.   Painful coccyx bone from sitting over time and itchy. Had used a OTC white cream before. Would like recommendation on something to keep it form getting irritated and itchy.    Has lost a lot of weight over the last year (45-50 lbs). Having a lot of pain and weakness.  Would like PT to help her stand and not feel so weak. Has trouble dressing and getting started. The weaker she gets the more off balance she is. Feels shaky. Can not take supplements because it makes her nausea worse.  She was stable at 122 lbs but now down to 114 lbs today  GERD/nausea- Hx of Gastric bypass, reports ongoing Nausea with abdominal pain/cramps for well over a year. Was referred to Gastroenterology has esophagitis, sent biopsy for H pylori and being treated at this with carafate and dexilant with zofran [place] PRN for breakthrough reflux Seeing GI again in September  Intractable migraine without status migrainosus, unspecified migraine type Currently being seen by the headache clinic, has had 2 injections which has controlled headaches.   Osteoporosis, unspecified osteoporosis type, unspecified pathological fracture presence -previously on prolia but reports has been about a year since last injection. Continues on calcium and Vit D - DG Bone Density has been scheduled.  Lower leg edema -Continues on torsemide and potassium supplement, leg swelling controlled. Worse in the evening -has been ongoing since she was a teenager  Severe episode of recurrent major depressive disorder, without psychotic features-Continues on zoloft 100 mg by mouth twice daily, information given on psych services in town- she has reached out and has upcoming appt in July   Chronic post-thoracotomy pain- Chronic pain, followed by pain  management and remains on suboxone. She also takes tizanidine as needed.  Recurrent UTI - Followed by Urology at [facility] at this time. No signs of UTI on visit in February.   Malignant neoplasm of bronchus and lung ([place])- S/p thoracotomy and being monitored by oncologist at [facility], had a surveillance scan in July 2019 and then repeat CT due to weight loss which was without signs of metastases per epic review.  Hypogammaglobulinemia --previously was followed by Dr Londell Moh in [place], will need new specialist now that she has moved, she reports she had been getting IVIG infusion per home health, has seen allergist plan to continue current treatment    Review of Systems:  Review of Systems  Constitutional: Positive for malaise/fatigue and weight loss (ongoing. ). Negative for chills and fever.  HENT: Positive for congestion, hearing loss and tinnitus.   Respiratory: Positive for shortness of breath. Negative for sputum production.   Cardiovascular: Positive for palpitations. Negative for chest pain and leg swelling.  Gastrointestinal: Positive for abdominal pain, constipation, heartburn and nausea.  Genitourinary: Positive for frequency and urgency.       Incontinence  Urgency and frequency stable  Musculoskeletal: Positive for back pain, joint pain, myalgias and neck pain.  Skin: Positive for itching.  Neurological: Positive for dizziness, weakness and headaches.  Psychiatric/Behavioral: Positive for depression and memory loss. The patient is nervous/anxious and has insomnia.     Past Medical History:  Diagnosis Date  . Arachnoiditis   . Bipolar disorder ([place])   . Chronic back pain   . Chronic pain   . Chronic post-thoracotomy pain   . CKD (chronic kidney disease)   . GERD (gastroesophageal reflux disease)   . Hypogammaglobulinemia ([place])   . Hypotension   . Hypothyroid   . 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 Lung cancer ([place]) 2011, 2014  . Memory loss   . Migraine    . Opioid abuse ([place])   . Osteoporosis   . Presence of neurostimulator   . Recurrent upper respiratory infection (URI)   . Restless legs   . Sleep apnea   . Tremor   . Urge incontinence   . Urticaria    long time ago, nothing recent   Past Surgical History:  Procedure Laterality Date  . ABDOMINAL HYSTERECTOMY    . CARPAL TUNNEL RELEASE    . CATARACT EXTRACTION Bilateral 2019  . CHOLECYSTECTOMY    . COLONOSCOPY  2015   [facility]  . CT LUNG SCREENING  2018  . DG  BONE DENSITY ([place] [facility])  2018  . DIAGNOSTIC MAMMOGRAM  2019  . GASTRIC BYPASS    . LUNG CANCER SURGERY  02/2010  . SPINAL CORD STIMULATOR IMPLANT     Social History:   reports that she has never smoked. She has never used smokeless tobacco. She reports current alcohol use. She reports that she does not use drugs.  Family History  Problem Relation Age of Onset  . Hypertension Mother   . GER disease Mother   . Dementia Mother   . Osteoporosis Mother   .  Arthritis Mother   . Parkinson's disease Father   . Congestive Heart Failure Father   . Pneumonia Father   . Arthritis Father   . Dementia Father   . Asthma Sister   . Asthma Daughter     Medications: Patient's Medications  New Prescriptions   No medications on file  Previous Medications   ACETAMINOPHEN (TYLENOL) 650 MG CR TABLET    Take 1,300 mg by mouth every 8 (eight) hours as needed for pain or fever.    APOAEQUORIN (PREVAGEN PO)    1 by mouth daily   BACILLUS COAGULANS-INULIN (ALIGN PREBIOTIC-PROBIOTIC PO)    1 by mouth daily   BISMUTH SUBSALICYLATE (PEPTO-BISMOL PO)    1-2 daily as needed    BUPRENORPHINE HCL-NALOXONE HCL (SUBOXONE) 4-1 MG FILM    Place under the tongue. 2 daily   CALCIUM CITRATE 200 MG TABS    Take 950 mg by mouth daily.    CYCLOSPORINE (RESTASIS) 0.05 % OPHTHALMIC EMULSION    Place 1 drop into both eyes 2 (two) times daily.   DEXLANSOPRAZOLE (DEXILANT) 60 MG CAPSULE    Take 1 capsule (60 mg total) by mouth daily.   FLUTICASONE (FLONASE)  50 MCG/ACT NASAL SPRAY    Place 1 spray into both nostrils 2 (two) times a day.   HOMEOPATHIC PRODUCTS (EARACHE RELIEF OT)    Place in ear(s). PRN   IMMUNE GLOBULIN, HUMAN, (GAMMAGARD S/D LESS IGA) 10 G INJECTION    Inject into the vein every 21 ( twenty-one) days.    LEVOTHYROXINE (SYNTHROID, LEVOTHROID) 75 MCG TABLET    Take 75 mcg by mouth daily before breakfast.    LUBIPROSTONE (AMITIZA) 24 MCG CAPSULE    Take 24 mcg by mouth as needed.    MAGNESIUM OXIDE (MAG-OX) 400 MG TABLET    Take 400 mg by mouth daily.    MELATONIN 5 MG CAPS    Take 5 mg by mouth at bedtime.    MISC NATURAL PRODUCTS (CRAN-B-OTC PO)    Take by mouth. 1 daily   ONDANSETRON (ZOFRAN-ODT) 8 MG DISINTEGRATING TABLET    Take 1 tablet (8 mg total) by mouth every 8 (eight) hours as needed for nausea or vomiting.   OVER THE COUNTER MEDICATION    Take 1 tablet by mouth daily. ADEK   POTASSIUM CHLORIDE SA (K-DUR,KLOR-CON) 20 MEQ TABLET    Take 40 mEq by mouth daily.    PYRIDOXINE (VITAMIN B-6) 100 MG TABLET    Take 100 mg by mouth daily.   ROPINIROLE (REQUIP) 0.5 MG TABLET    2 by mouth daily   SERTRALINE (ZOLOFT) 100 MG TABLET    Take 100 mg by mouth 2 (two) times daily.    SUCRALFATE (CARAFATE) 1 GM/10ML SUSPENSION    Take 200 mg by mouth 3 (three) times daily as needed. Mouth sores   TIZANIDINE (ZANAFLEX) 2 MG TABLET    Take 2 mg by mouth 3 (three) times daily.   TORSEMIDE (DEMADEX) 20 MG TABLET    Take 20 mg by mouth daily.    VITAMIN B-12 (CYANOCOBALAMIN) 1000 MCG TABLET    Take 1,000 mcg by mouth daily.   Modified Medications   No medications on file  Discontinued Medications   ASPIRIN-CAFFEINE (BC FAST PAIN RELIEF) 845-65 MG PACK    Take 1 Package by mouth as needed (up to 2 packets daily). Pain   AZELASTINE (ASTELIN) 0.1 % NASAL SPRAY    1 spray in each nostril 1-2 times a  daily as needed   DICLOFENAC POTASSIUM (CAMBIA) 50 MG PACK    Take 1 Packet mixed in 2-4 tablespoonful of water without food. Max of 2 doses in 24  hours. Take as needed. RX'ed by Jackolyn Confer   GUAIFENESIN (MUCUS RELIEF) 400 MG TABS TABLET    Take 400 mg by mouth as needed.    LANSOPRAZOLE (PREVACID SOLUTAB) 30 MG DISINTEGRATING TABLET    Take 30 mg by mouth 2 (two) times daily.     Physical Exam:  Vitals:   01/27/19 1002  BP: 118/62  Pulse: 65  Temp: 97.9 F (36.6 C)  TempSrc: Oral  SpO2: 94%  Weight: 114 lb (51.7 kg)  Height: 5\' 4"  (1.626 m)   Body mass index is 19.57 kg/m. Wt Readings from Last 3 Encounters:  01/27/19 114 lb (51.7 kg)  01/25/19 120 lb (54.4 kg)  12/28/18 120 lb (54.4 kg)    Physical Exam Constitutional:      General: She is not in acute distress.    Appearance: She is well-developed. She is not diaphoretic.  HENT:     Head: Normocephalic and atraumatic.     Right Ear: Tympanic membrane and ear canal normal.     Left Ear: Tympanic membrane and ear canal normal.     Mouth/Throat:     Comments: Poor dentition  Eyes:     Conjunctiva/sclera: Conjunctivae normal.     Pupils: Pupils are equal, round, and reactive to light.  Neck:     Musculoskeletal: Normal range of motion and neck supple.  Cardiovascular:     Rate and Rhythm: Normal rate and regular rhythm.     Heart sounds: Normal heart sounds.  Pulmonary:     Effort: Pulmonary effort is normal.     Breath sounds: Normal breath sounds.  Abdominal:     General: Bowel sounds are normal.     Palpations: Abdomen is soft.  Musculoskeletal:        General: No tenderness.     Right lower leg: No edema.     Left lower leg: No edema.  Skin:    General: Skin is warm and dry.     Coloration: Skin is pale.     Findings: No rash.  Neurological:     Mental Status: She is alert and oriented to person, place, and time.  Psychiatric:        Mood and Affect: Affect is flat.     Labs reviewed: Basic Metabolic Panel: No results for input(s): NA, K, CL, CO2, GLUCOSE, BUN, CREATININE, CALCIUM, MG, PHOS, TSH in the last 8760 hours. Liver Function  Tests: No results for input(s): AST, ALT, ALKPHOS, BILITOT, PROT, ALBUMIN in the last 8760 hours. No results for input(s): LIPASE, AMYLASE in the last 8760 hours. No results for input(s): AMMONIA in the last 8760 hours. CBC: No results for input(s): WBC, NEUTROABS, HGB, HCT, MCV, PLT in the last 8760 hours. Lipid Panel: No results for input(s): CHOL, HDL, LDLCALC, TRIG, CHOLHDL, LDLDIRECT in the last 8760 hours. TSH: No results for input(s): TSH in the last 8760 hours. A1C: No results found for: HGBA1C   Assessment/Plan 1. Weight loss Ongoing weight loss over the last year. Pt with poor diet due to GI issues and decrease PO intake leading to weight loss.  - Ambulatory referral to [place] [facility] - CBC with Differential/Platelet - Amb ref to Medical Nutrition Therapy-MNT  2. Acquired hypothyroidism - levothyroxine (SYNTHROID) 75 MCG tablet; Take  1 tablet (75 mcg total) by mouth daily before breakfast.  Dispense: 90 tablet; Refill: 1 - TSH  3. Debility -progressive weakness, discussed importance of proper nutritional and activity. Consults place for [facility] PT to help with strength and endurance. Pt with a  Lot of GI issues and can not tolerate a lot of foods. S/p gastric bypass as well with ongoing weight loss.  - Ambulatory referral to [place] - Amb ref to Medical Nutrition Therapy-MNT  4. Dry eyes Continues on drops, can use baby shampoo to help with crust in eyes. To notify if area becomes red, purulent drainage or pain.   5. Lower leg edema Stable, continue   6. Esophagitis Continues on carafate and dexilant per GI.   7. Malignant neoplasm of bronchus and lung ([place]) S/p resection, now left with chronic pain following with pain management. Continues to follow up with oncologist.   8. Intractable migraine without status migrainosus, unspecified migraine type Stable, continues to follow up with migraine clinic with good control.    9.  Osteoporosis, unspecified osteoporosis type, unspecified pathological fracture presence Continues on cal and vit d, bone density scheduled.  10. Coccyx pain Encouraged to change positions frequently to avoid excessive pressure on area. Pt denies breakdown and declines exam. Can also use Aquaphor for barrier and itchy skin.  Next appt: 05/01/2019, sooner if needed  Carlos American. [place], [facility] [phone]

## 2019-01-27 NOTE — Telephone Encounter (Signed)
  Follow up Call-  Call back number 01/25/2019  Post procedure Call Back phone  # [phone]  Permission to leave phone message Yes  Some recent data might be hidden     Patient questions:  Do you have a fever, pain , or abdominal swelling? No. Pain Score  0 *  Have you tolerated food without any problems? Yes.    Have you been able to return to your normal activities? Yes.    Do you have any questions about your discharge instructions: Diet   No. Medications  No. Follow up visit  No.  Do you have questions or concerns about your Care? No.  Actions: * If pain score is 4 or above: No action needed, pain <4.  1. Have you developed a fever since your procedure? NO  2.   Have you had an respiratory symptoms (SOB or cough) since your procedure? NO  3.   Have you tested positive for COVID 19 since your procedure NO  4.   Have you had any family members/close contacts diagnosed with the COVID 19 since your procedure?  NO   If yes to any of these questions please route to Joylene John, RN and Alphonsa Gin, RN.

## 2019-01-28 LAB — CBC WITH DIFFERENTIAL/PLATELET
Absolute Monocytes: 706 cells/uL (ref 200–950)
Basophils Absolute: 76 cells/uL (ref 0–200)
Basophils Relative: 0.9 %
Eosinophils Absolute: 92 cells/uL (ref 15–500)
Eosinophils Relative: 1.1 %
HCT: 39.5 % (ref 35.0–45.0)
Hemoglobin: 13.4 g/dL (ref 11.7–15.5)
Lymphs Abs: 2075 cells/uL (ref 850–3900)
MCH: 28.6 pg (ref 27.0–33.0)
MCHC: 33.9 g/dL (ref 32.0–36.0)
MCV: 84.4 fL (ref 80.0–100.0)
MPV: 12.7 fL — ABNORMAL HIGH (ref 7.5–12.5)
Monocytes Relative: 8.4 %
Neutro Abs: 5452 cells/uL (ref 1500–7800)
Neutrophils Relative %: 64.9 %
Platelets: 225 10*3/uL (ref 140–400)
RBC: 4.68 10*6/uL (ref 3.80–5.10)
RDW: 14 % (ref 11.0–15.0)
Total Lymphocyte: 24.7 %
WBC: 8.4 10*3/uL (ref 3.8–10.8)

## 2019-01-28 LAB — COMPLETE METABOLIC PANEL WITH GFR
AG Ratio: 1.3 (calc) (ref 1.0–2.5)
ALT: 15 U/L (ref 6–29)
AST: 21 U/L (ref 10–35)
Albumin: 4.1 g/dL (ref 3.6–5.1)
Alkaline phosphatase (APISO): 64 U/L (ref 37–153)
BUN/Creatinine Ratio: 25 (calc) — ABNORMAL HIGH (ref 6–22)
BUN: 25 mg/dL (ref 7–25)
CO2: 29 mmol/L (ref 20–32)
Calcium: 9.3 mg/dL (ref 8.6–10.4)
Chloride: 104 mmol/L (ref 98–110)
Creat: 1 mg/dL — ABNORMAL HIGH (ref 0.60–0.93)
GFR, Est African American: 66 mL/min/{1.73_m2} (ref >=60)
GFR, Est Non African American: 57 mL/min/{1.73_m2} — ABNORMAL LOW (ref >=60)
Globulin: 3.2 g/dL (calc) (ref 1.9–3.7)
Glucose, Bld: 70 mg/dL (ref 65–139)
Potassium: 4.5 mmol/L (ref 3.5–5.3)
Sodium: 142 mmol/L (ref 135–146)
Total Bilirubin: 0.4 mg/dL (ref 0.2–1.2)
Total Protein: 7.3 g/dL (ref 6.1–8.1)

## 2019-01-28 LAB — TSH: TSH: 4.58 mIU/L — ABNORMAL HIGH (ref 0.40–4.50)

## 2019-02-13 LAB — HM MAMMOGRAPHY

## 2019-02-14 ENCOUNTER — Ambulatory Visit: Payer: Medicare Other | Admitting: Psychiatry

## 2019-02-15 DIAGNOSIS — R634 Abnormal weight loss: Secondary | ICD-10-CM

## 2019-02-15 DIAGNOSIS — M810 Age-related osteoporosis without current pathological fracture: Secondary | ICD-10-CM

## 2019-02-15 DIAGNOSIS — R2689 Other abnormalities of gait and mobility: Secondary | ICD-10-CM

## 2019-02-15 DIAGNOSIS — R531 Weakness: Secondary | ICD-10-CM

## 2019-02-22 ENCOUNTER — Ambulatory Visit (INDEPENDENT_AMBULATORY_CARE_PROVIDER_SITE_OTHER): Payer: Self-pay | Admitting: Psychiatry

## 2019-02-22 ENCOUNTER — Telehealth: Payer: Self-pay | Admitting: *Deleted

## 2019-02-22 ENCOUNTER — Other Ambulatory Visit: Payer: Self-pay

## 2019-02-22 ENCOUNTER — Encounter: Payer: Self-pay | Admitting: Psychiatry

## 2019-02-22 VITALS — BP 97/64 | HR 87 | Ht 63.0 in | Wt 107.0 lb

## 2019-02-22 DIAGNOSIS — F3162 Bipolar disorder, current episode mixed, moderate: Secondary | ICD-10-CM

## 2019-02-22 DIAGNOSIS — F419 Anxiety disorder, unspecified: Secondary | ICD-10-CM

## 2019-02-22 DIAGNOSIS — F99 Mental disorder, not otherwise specified: Secondary | ICD-10-CM

## 2019-02-22 DIAGNOSIS — F5105 Insomnia due to other mental disorder: Secondary | ICD-10-CM

## 2019-02-22 MED ORDER — OLANZAPINE 2.5 MG PO TABS: ORAL_TABLET | ORAL | 1 refills | Status: DC

## 2019-02-22 NOTE — Telephone Encounter (Signed)
Megan Brewer with [facility] called and left message on clinical intake stating that patient was complaining of 10/10 Right sided pain.   I tried calling Megan Brewer, [place] patient and spoke with her. She stated that she has had this pain for years. It was coming from a past surgery she had years ago and she knows that it will not get any better.  I offered her a TeleVisit/appointment to come in to be seen and she refused stated she will call us if she needs [place].

## 2019-02-22 NOTE — Progress Notes (Signed)
[facility] MD/PA/NP Initial Note  02/22/2019 5:36 PM Megan Brewer  MRN:  [id number]  Chief Complaint:  Chief Complaint    Depression; Insomnia; Anxiety      HPI: Pt is a 73 yo female being seen for initial evaluation for mood, anxiety, and insomnia. She reports, "before I die I would like to be able to laugh." Reports that she is able to enjoy some things but is unable to laugh. She reports "I'm kind of numb most of the time." Reports sad mood. She reports that she is unsure if she is having any significant irritability. She reports anxiety and having acute anxiety attacks with increased HR, SOB, and shaking. She reports that these attacks are infrequent and has been unable to identify a trigger. She reports chronic anxiety and worry since childhood. She reports social anxiety. Denies obsessions or compulsions.  She reports that her sleep is "all or nothing" with periods of sleeping excessively alternating with periods of sleeplessness. She reports that she is having more frequent shifts in sleep patterns. She reports that sleep hygiene practices have been ineffective. Occ nightmares. She reports losing 50 lbs in 6 months. She reports that her appetite is low. She reports that food intake is limited due to esophagitis and h/o gastric bypass. Reports that she has to eat small amounts and can only eat certain food. "I have no energy." She reports chronic pain and reports pain with most activities. She reports that her motivation is good and that she is not able to physically do the things she would like to do. She reports that she used to make sure she was well groomed and dressed nicely. She reports that she has had difficulty with memory difficulties since chemotherapy. She reports difficulty with concentration. Reports that she is no longer able to focus on reading material. She reports chronic SI since college- "I think about it but I don't have the ability to act on it." Denies suicidal intent. Denies  past suicide attempts. Reports passive death wishes.  She reports manic s/s are less frequent and less "intense." Denies any recent periods of elevated mood. Denies racing thoughts. She reports past manic episodes.  She reports that she has had predominantly more depressive episodes compared to manic episodes in her lifetime.   She reports that her husband has told her that she seems to be hallucinating. Denies paranoia.   Born in [place] and raised in [place], [place]. Reports "I was not a right fit for my parents... I did not get off the right start." Has a bachelor's degree in education and has taken graduate level courses. Taught school. Now retired. Married almost 50 years. Has 2 children. Has 5 grandchildren under the age of 8. Reports that her daughter got married a couple of weeks ago and let pt know 24 hours prior. She reports that in the past she was outgoing and enjoyed travel.   Past Psychiatric Medication Trials: Sertraline- Was effective for about 25 years and no longer seems to be as effective.  Prozac Paxil  Cymbalta- may have caused psychosis. Depakote Gabapentin Lithium- Edema. Minimally effective Seroquel Risperidone Melatonin Ativan- Reports taking until 2 months ago.  Valium- "great for my depression." Xanax Trazodone  May have taken Zyprexa, Abilify, Remeron   Visit Diagnosis:    ICD-10-CM   1. Bipolar disorder, current episode mixed, moderate (HCC)  F31.62 OLANZapine (ZYPREXA) 2.5 MG tablet  2. Anxiety disorder, unspecified type  F41.9 OLANZapine (ZYPREXA) 2.5 MG tablet  3. Insomnia due to  other mental disorder  F51.05 OLANZapine (ZYPREXA) 2.5 MG tablet   F99     Past Psychiatric History: She reports that she has seen different psychiatrist and saw Dr. Al Corpus for about 25 years. She reports seeing different therapists since college. She reports several past psychiatric hospitalizations and one hospitalization for a "psychotic break" which she attributes to  an adverse medication reaction. Reports that it has been years since last psychiatric hospitalization.   Past Medical History:  Past Medical History:  Diagnosis Date  . Arachnoiditis   . Bipolar disorder ([place])   . Chronic back pain   . Chronic pain   . Chronic post-thoracotomy pain   . CKD (chronic kidney disease)   . GERD (gastroesophageal reflux disease)   . Hypogammaglobulinemia ([place])   . Hypotension   . Hypothyroid   . 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 Lung cancer ([place]) 2011, 2014  . Memory loss   . Migraine   . Opioid abuse ([place])   . Osteoporosis   . Presence of neurostimulator   . Recurrent upper respiratory infection (URI)   . Restless legs   . Sleep apnea   . Tremor   . Urge incontinence   . Urticaria    long time ago, nothing recent    Past Surgical History:  Procedure Laterality Date  . ABDOMINAL HYSTERECTOMY    . CARPAL TUNNEL RELEASE    . CATARACT EXTRACTION Bilateral 2019  . CHOLECYSTECTOMY    . COLONOSCOPY  2015   [facility]  . CT LUNG SCREENING  2018  . DG  BONE DENSITY ([facility])  2018  . DIAGNOSTIC MAMMOGRAM  2019  . GASTRIC BYPASS    . LUNG CANCER SURGERY  02/2010  . SPINAL CORD STIMULATOR IMPLANT      Family Psychiatric History: Suspects daughter may have Bipolar D/O.   Family History:  Family History  Problem Relation Age of Onset  . Hypertension Mother   . GER disease Mother   . Dementia Mother   . Osteoporosis Mother   . Arthritis Mother   . Bipolar disorder Mother   . Parkinson's disease Father   . Congestive Heart Failure Father   . Pneumonia Father   . Arthritis Father   . Dementia Father   . Depression Father   . Asthma Sister   . Depression Sister   . Bipolar disorder Brother   . Asthma Daughter     Social History:  Social History   Socioeconomic History  . Marital status: Married    Spouse name: Not on file  . Number of children: Not on file  . Years of education: Not on file  . Highest education level: Not on  file  Occupational History  . Not on file  Social Needs  . Financial resource strain: Not on file  . Food insecurity    Worry: Not on file    Inability: Not on file  . Transportation needs    Medical: Not on file    Non-medical: Not on file  Tobacco Use  . Smoking status: Never Smoker  . Smokeless tobacco: Never Used  Substance and Sexual Activity  . Alcohol use: Yes    Comment: rare  . Drug use: Never  . Sexual activity: Not on file  Lifestyle  . Physical activity    Days per week: Not on file    Minutes per session: Not on file  . Stress: Not on file  Relationships  . Social connections  Talks on phone: Not on file    Gets together: Not on file    Attends religious service: Not on file    Active member of club or organization: Not on file    Attends meetings of clubs or organizations: Not on file    Relationship status: Not on file  Other Topics Concern  . Not on file  Social History Narrative   Diet: None      Caffeine: coffee, tea, sodas less than or 1 daily.      Married, if yes what year: Yes, 1972      Do you live in a house, apartment, assisted living, condo, trailer, ect: Two story house       Pets: None      Current/Past profession: Teach      Exercise: None         Living Will: No   DNR: No   POA/HPOA: No      Functional Status:   Do you have difficulty bathing or dressing yourself? yes   Do you have difficulty preparing food or eating? yes   Do you have difficulty managing your medications? yes   Do you have difficulty managing your finances?yes   Do you have difficulty affording your medications? no    Allergies:  Allergies  Allergen Reactions  . Butorphanol Tartrate Other (See Comments)    "like someone is brushing my brain with a brillo pad"  . Tetracycline Nausea Only    Causes ringing in ears, dizziness, migraine, nausea  . Corticosteroids Other (See Comments)    12/02/2016 interview by Melissa Montane: Oral dosing causes  migraines/nausea/tinnitus. Prev tolerated IV and intranasal admin w/o difficulty.  . Cefixime Other (See Comments)    Headache   . Ciprofloxacin Other (See Comments)    Headache, dizziness, ringing in the ears  . Doxycycline Other (See Comments)    unknown  . Gabapentin     Throat swells/closes  . Isometheptene-Dichloral-Apap Other (See Comments)    unknown  . Ketorolac     12/02/2016 interview by QMV: Oral dosing prednisone/corticosteroids causes migraines/nausea/tinnitus. Prev tolerated IV and intranasal admin w/o difficulty.  . Prednisone Other (See Comments)    Migraine, dizzy, ringing in ears-can take it IV 12/02/2016 interview by [name]: Oral prednisone dosing causes migraines/nausea/tinnitus. Prev tolerated IV and intranasal admin w/o difficulty.  . Promethazine Other (See Comments)    causes severe abdominal pain when taken IV; however she can take PO or IM  . Stadol [Butorphanol]     Cerebral pain  . Erythromycin Base Diarrhea and Itching    Severe diarrhea  . Propoxyphene Nausea Only    Metabolic Disorder Labs: No results found for: HGBA1C, MPG No results found for: PROLACTIN Lab Results  Component Value Date   CHOL  01/27/2010    144        ATP III CLASSIFICATION:  <200     mg/dL   Desirable  200-239  mg/dL   Borderline High  >=240    mg/dL   High          TRIG 68 01/27/2010   HDL 58 01/27/2010   CHOLHDL 2.5 01/27/2010   VLDL 14 01/27/2010   LDLCALC  01/27/2010    72        Total Cholesterol/HDL:CHD Risk Coronary Heart Disease Risk Table                     Men   Women  1/2 Average Risk  3.4   3.3  Average Risk       5.0   4.4  2 X Average Risk   9.6   7.1  3 X Average Risk  23.4   11.0        Use the calculated Patient Ratio above and the CHD Risk Table to determine the patient's CHD Risk.        ATP III CLASSIFICATION (LDL):  <100     mg/dL   Optimal  100-129  mg/dL   Near or Above                    Optimal  130-159  mg/dL   Borderline  160-189   mg/dL   High  >190     mg/dL   Very High   LDLCALC  01/26/2010    71        Total Cholesterol/HDL:CHD Risk Coronary Heart Disease Risk Table                     Men   Women  1/2 Average Risk   3.4   3.3  Average Risk       5.0   4.4  2 X Average Risk   9.6   7.1  3 X Average Risk  23.4   11.0        Use the calculated Patient Ratio above and the CHD Risk Table to determine the patient's CHD Risk.        ATP III CLASSIFICATION (LDL):  <100     mg/dL   Optimal  100-129  mg/dL   Near or Above                    Optimal  130-159  mg/dL   Borderline  160-189  mg/dL   High  >190     mg/dL   Very High   Lab Results  Component Value Date   TSH 4.58 (H) 01/27/2019    Therapeutic Level Labs: No results found for: LITHIUM No results found for: VALPROATE No components found for:  CBMZ  Current Medications: Current Outpatient Medications  Medication Sig Dispense Refill  . acetaminophen (TYLENOL) 650 MG CR tablet Take 1,300 mg by mouth every 8 (eight) hours as needed for pain or fever.     Marland Kitchen Apoaequorin (PREVAGEN PO) 1 by mouth daily    . Bacillus Coagulans-Inulin (ALIGN PREBIOTIC-PROBIOTIC PO) 1 by mouth daily    . Buprenorphine HCl-Naloxone HCl (SUBOXONE) 4-1 MG FILM Place under the tongue. 2 daily    . Calcium Citrate 200 MG TABS Take 950 mg by mouth daily.     . cycloSPORINE (RESTASIS) 0.05 % ophthalmic emulsion Place 1 drop into both eyes 2 (two) times daily.    Marland Kitchen dexlansoprazole (DEXILANT) 60 MG capsule Take 1 capsule (60 mg total) by mouth daily. 30 capsule 3  . Erenumab-aooe (AIMOVIG Welcome) Inject into the skin.    . Homeopathic Products (EARACHE RELIEF OT) Place in ear(s). PRN    . immune globulin, human, (GAMMAGARD S/D LESS IGA) 10 g injection Inject into the vein every 21 ( twenty-one) days.     Marland Kitchen levothyroxine (SYNTHROID) 75 MCG tablet Take 1 tablet (75 mcg total) by mouth daily before breakfast. 90 tablet 1  . lubiprostone (AMITIZA) 24 MCG capsule Take 24 mcg by mouth as  needed.     . magnesium oxide (MAG-OX) 400 MG tablet Take 400 mg by mouth daily.     Marland Kitchen  Melatonin 5 MG CAPS Take 5 mg by mouth at bedtime.     . Misc Natural Products (CRAN-B-OTC PO) Take by mouth. 1 daily    . ondansetron (ZOFRAN-ODT) 8 MG disintegrating tablet Take 1 tablet (8 mg total) by mouth every 8 (eight) hours as needed for nausea or vomiting. 90 tablet 1  . potassium chloride SA (K-DUR,KLOR-CON) 20 MEQ tablet Take 40 mEq by mouth daily.     Marland Kitchen pyridOXINE (VITAMIN B-6) 100 MG tablet Take 100 mg by mouth daily.    Marland Kitchen rOPINIRole (REQUIP) 0.5 MG tablet 2 by mouth daily    . sertraline (ZOLOFT) 100 MG tablet Take 100 mg by mouth 2 (two) times daily.     . sucralfate (CARAFATE) 1 GM/10ML suspension Take 200 mg by mouth 3 (three) times daily as needed. Mouth sores    . tiZANidine (ZANAFLEX) 2 MG tablet Take 2 mg by mouth 3 (three) times daily.    Marland Kitchen torsemide (DEMADEX) 20 MG tablet Take 20 mg by mouth daily.     . vitamin B-12 (CYANOCOBALAMIN) 1000 MCG tablet Take 1,000 mcg by mouth daily.     . Bismuth Subsalicylate (PEPTO-BISMOL PO) 1-2 daily as needed     . fluticasone (FLONASE) 50 MCG/ACT nasal spray Place 1 spray into both nostrils 2 (two) times a day.    Marland Kitchen OLANZapine (ZYPREXA) 2.5 MG tablet Take 1/2 tablet at bedtime for 2-4 days, then may increase to 1 tablet at bedtime as tolerated 30 tablet 1  . OVER THE COUNTER MEDICATION Take 1 tablet by mouth daily. ADEK     No current facility-administered medications for this visit.     Medication Side Effects: none  Orders placed this visit:  No orders of the defined types were placed in this encounter.   Psychiatric Specialty Exam:  ROS  Blood pressure 97/64, pulse 87, height 5\' 3"  (1.6 m), weight 107 lb (48.5 kg).Body mass index is 18.95 kg/m.  General Appearance: Casual and Neat  Eye Contact:  Good  Speech:  Clear and Coherent  Volume:  Normal  Mood:  Anxious and Depressed  Affect:  Depressed and Anxious  Thought Process:   Coherent  Orientation:  Full (Time, Place, and Person)  Thought Content: Logical   Suicidal Thoughts:  No  Homicidal Thoughts:  No  Memory:  WNL  Judgement:  Good  Insight:  Fair  Psychomotor Activity:  Decreased  Concentration:  Concentration: Good  Recall:  Good  Fund of Knowledge: Good  Language: Good  Assets:  Communication Skills  ADL's:  Intact  Cognition: WNL  Prognosis:  Good    Receiving Psychotherapy: No   Treatment Plan/Recommendations: Pt seen for 60 minutes and greater than 50% of visit spent counseling pt re: possible treatment options. Discussed potential benefits, risks, and side effects of Olanzapine. Discussed potential metabolic side effects associated with atypical antipsychotics, as well as potential risk for movement side effects. Advised pt to contact office if movement side effects occur. Pt agrees to trial of Olanzapine. Will start Olanzapine 2.5 mg 1/2 tab po QHS x 2-4 days, then increase to 1 tab po QHS for mood, anxiety, and insomnia. Discussed that Olanzapine may also help improve appetite and weight. Discussed that further titration of Olanzapine may be indicated. Recommend continuing Sertraline for mood and anxiety. Pt to f/u in 4 weeks or sooner if clinically indicated. Patient advised to contact office with any questions, adverse effects, or acute worsening in signs and symptoms.  Thayer Headings, PMHNP

## 2019-02-22 NOTE — Telephone Encounter (Signed)
Noted, thank you

## 2019-02-27 ENCOUNTER — Emergency Department (HOSPITAL_COMMUNITY): Payer: Medicare Other

## 2019-02-27 ENCOUNTER — Other Ambulatory Visit: Payer: Self-pay

## 2019-02-27 ENCOUNTER — Telehealth: Payer: Self-pay

## 2019-02-27 ENCOUNTER — Emergency Department (HOSPITAL_COMMUNITY)
Admission: EM | Admit: 2019-02-27 | Discharge: 2019-02-28 | Disposition: A | Discharge status: Home / Self Care | Payer: Medicare Other | Attending: Emergency Medicine | Admitting: Emergency Medicine

## 2019-02-27 DIAGNOSIS — E039 Hypothyroidism, unspecified: Secondary | ICD-10-CM | POA: Diagnosis not present

## 2019-02-27 DIAGNOSIS — N189 Chronic kidney disease, unspecified: Secondary | ICD-10-CM | POA: Insufficient documentation

## 2019-02-27 DIAGNOSIS — Z886 Allergy status to analgesic agent status: Secondary | ICD-10-CM | POA: Diagnosis not present

## 2019-02-27 DIAGNOSIS — I959 Hypotension, unspecified: Secondary | ICD-10-CM | POA: Diagnosis not present

## 2019-02-27 DIAGNOSIS — Z888 Allergy status to other drugs, medicaments and biological substances status: Secondary | ICD-10-CM | POA: Insufficient documentation

## 2019-02-27 DIAGNOSIS — Z20828 Contact with and (suspected) exposure to other viral communicable diseases: Secondary | ICD-10-CM | POA: Diagnosis not present

## 2019-02-27 DIAGNOSIS — Z79899 Other long term (current) drug therapy: Secondary | ICD-10-CM | POA: Insufficient documentation

## 2019-02-27 DIAGNOSIS — Z885 Allergy status to narcotic agent status: Secondary | ICD-10-CM | POA: Insufficient documentation

## 2019-02-27 DIAGNOSIS — I129 Hypertensive chronic kidney disease with stage 1 through stage 4 chronic kidney disease, or unspecified chronic kidney disease: Secondary | ICD-10-CM | POA: Insufficient documentation

## 2019-02-27 DIAGNOSIS — R079 Chest pain, unspecified: Secondary | ICD-10-CM

## 2019-02-27 DIAGNOSIS — Z85118 Personal history of other malignant neoplasm of bronchus and lung: Secondary | ICD-10-CM | POA: Diagnosis not present

## 2019-02-27 DIAGNOSIS — R55 Syncope and collapse: Secondary | ICD-10-CM | POA: Insufficient documentation

## 2019-02-27 DIAGNOSIS — Z881 Allergy status to other antibiotic agents status: Secondary | ICD-10-CM | POA: Diagnosis not present

## 2019-02-27 DIAGNOSIS — R0789 Other chest pain: Secondary | ICD-10-CM | POA: Diagnosis present

## 2019-02-27 LAB — CBC WITH DIFFERENTIAL/PLATELET
Abs Immature Granulocytes: 0.02 10*3/uL (ref 0.00–0.07)
Basophils Absolute: 0.1 10*3/uL (ref 0.0–0.1)
Basophils Relative: 1 %
Eosinophils Absolute: 0 10*3/uL (ref 0.0–0.5)
Eosinophils Relative: 0 %
HCT: 39 % (ref 36.0–46.0)
Hemoglobin: 12.5 g/dL (ref 12.0–15.0)
Immature Granulocytes: 0 %
Lymphocytes Relative: 8 %
Lymphs Abs: 0.6 10*3/uL — ABNORMAL LOW (ref 0.7–4.0)
MCH: 28.3 pg (ref 26.0–34.0)
MCHC: 32.1 g/dL (ref 30.0–36.0)
MCV: 88.4 fL (ref 80.0–100.0)
Monocytes Absolute: 0.7 10*3/uL (ref 0.1–1.0)
Monocytes Relative: 11 %
Neutro Abs: 5.3 10*3/uL (ref 1.7–7.7)
Neutrophils Relative %: 80 %
Platelets: 142 10*3/uL — ABNORMAL LOW (ref 150–400)
RBC: 4.41 MIL/uL (ref 3.87–5.11)
RDW: 12.9 % (ref 11.5–15.5)
WBC: 6.7 10*3/uL (ref 4.0–10.5)
nRBC: 0 % (ref 0.0–0.2)

## 2019-02-27 LAB — COMPREHENSIVE METABOLIC PANEL
ALT: 17 U/L (ref 0–44)
AST: 23 U/L (ref 15–41)
Albumin: 3.5 g/dL (ref 3.5–5.0)
Alkaline Phosphatase: 60 U/L (ref 38–126)
Anion gap: 11 (ref 5–15)
BUN: 30 mg/dL — ABNORMAL HIGH (ref 8–23)
CO2: 29 mmol/L (ref 22–32)
Calcium: 9.3 mg/dL (ref 8.9–10.3)
Chloride: 102 mmol/L (ref 98–111)
Creatinine, Ser: 1.56 mg/dL — ABNORMAL HIGH (ref 0.44–1.00)
GFR calc Af Amer: 39 mL/min — ABNORMAL LOW (ref >60)
GFR calc non Af Amer: 33 mL/min — ABNORMAL LOW (ref >60)
Glucose, Bld: 83 mg/dL (ref 70–99)
Potassium: 4.1 mmol/L (ref 3.5–5.1)
Sodium: 142 mmol/L (ref 135–145)
Total Bilirubin: 0.3 mg/dL (ref 0.3–1.2)
Total Protein: 6.5 g/dL (ref 6.5–8.1)

## 2019-02-27 LAB — LACTIC ACID, PLASMA: Lactic Acid, Venous: 2.1 mmol/L (ref 0.5–1.9)

## 2019-02-27 LAB — TROPONIN I (HIGH SENSITIVITY)
Troponin I (High Sensitivity): 10 ng/L (ref <18)
Troponin I (High Sensitivity): 14 ng/L (ref <18)

## 2019-02-27 LAB — SARS CORONAVIRUS 2 BY RT PCR (HOSPITAL ORDER, PERFORMED IN ~~LOC~~ HOSPITAL LAB): SARS Coronavirus 2: NEGATIVE

## 2019-02-27 LAB — MAGNESIUM: Magnesium: 2 mg/dL (ref 1.7–2.4)

## 2019-02-27 MED ORDER — HEPARIN SOD (PORK) LOCK FLUSH 100 UNIT/ML IV SOLN
500.0000 [IU] | Freq: Once | INTRAVENOUS | Status: AC
  Administered 2019-02-28: 500 [IU]
  Filled 2019-02-27: qty 5

## 2019-02-27 MED ORDER — IOHEXOL 350 MG/ML SOLN
50.0000 mL | Freq: Once | INTRAVENOUS | Status: AC | PRN
  Administered 2019-02-27: 55 mL via INTRAVENOUS

## 2019-02-27 MED ORDER — SODIUM CHLORIDE 0.9% FLUSH: 10.0000 mL | INTRAVENOUS | Status: DC | PRN

## 2019-02-27 MED ORDER — SODIUM CHLORIDE 0.9% FLUSH: 10.0000 mL | Freq: Two times a day (BID) | INTRAVENOUS | Status: DC

## 2019-02-27 MED ORDER — SODIUM CHLORIDE 0.9 % IV BOLUS
1000.0000 mL | Freq: Once | INTRAVENOUS | Status: AC
  Administered 2019-02-27: 1000 mL via INTRAVENOUS

## 2019-02-27 NOTE — ED Provider Notes (Addendum)
[facility] DEPART[facility] Provider Note   CSN: [id number] Arrival date & time: 02/27/19  1733    History   Chief Complaint No chief complaint on file.   HPI Megan Brewer is a 73 y.o. female history of CKD, hypertension, immunoglobulin deficiency, lung cancer status post resection here presenting with pleuritic chest pain, hypotension.  Patient states that around 3 PM this afternoon, she had acute onset of left lower diaphragm pain.  She states that it is on the left side of her chest is worse when she takes a deep breath.  She called her home health care nurse and when they arrived, her blood pressure was in the 80s.  EMS was called and noticed that the blood pressure went down to the 60s and her glucose level was 80.  She is a known difficult IV stick so they attempted to place an IV but was unsuccessful.  Patient denies any fevers or chills or cough or any recent sick contacts.  Patient does have a port in the left chest since she has lung cancer.  Not getting any active chemo or radiation currently.  She states that her potassium is usually low but she is taking potassium as prescribed. Has chronic loose stools but no vomiting or diarrhea      The history is provided by the patient.    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[place])    Lung cancer ([facility]) 2011, 2014   Memory loss    Migraine    Opioid abuse ([place])    Osteoporosis    Presence of neurostimulator    Recurrent upper respiratory infection (URI)    Restless legs    Sleep apnea    Tremor    Urge incontinence    Urticaria    long time ago, nothing recent    Patient Active Problem List   Diagnosis Date Noted   Weight loss 01/27/2019   Acquired hypothyroidism 01/27/2019   Esophagitis 01/27/2019   Intractable migraine without status migrainosus 01/27/2019   Osteoporosis 01/27/2019   Chronic sinusitis 12/28/2018   Food intolerance/GI symptoms 12/28/2018   Acute maxillary sinusitis 12/28/2018   Lower leg edema 12/20/2018   ADENOCARCINOMA, RIGHT LUNG 01/31/2010   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      Home Medications    Prior to Admission medications   Medication Sig Start Date End Date Taking? Authorizing Provider  acetaminophen (TYLENOL) 650 MG CR tablet Take 1,300 mg by mouth every 8 (eight)  hours as needed for pain or fever.     [provider]  Apoaequorin (PREVAGEN PO) 1 by mouth daily    [provider]  Bacillus Coagulans-Inulin (ALIGN PREBIOTIC-PROBIOTIC PO) 1 by mouth daily    [provider]  Bismuth Subsalicylate (PEPTO-BISMOL PO) 1-2 daily as needed     [provider]  Buprenorphine HCl-Naloxone HCl (SUBOXONE) 4-1 MG FILM Place under the tongue. 2 daily    [provider]  Calcium Citrate 200 MG TABS Take 950 mg by mouth daily.  12/02/16   [provider]  cycloSPORINE (RESTASIS) 0.05 % ophthalmic emulsion Place 1 drop into both eyes 2 (two) times daily.    [provider]  dexlansoprazole (DEXILANT) 60 MG capsule Take 1 capsule (60 mg total) by  mouth daily. 01/25/19   Armbruster, Carlota Raspberry, MD  Erenumab-aooe (AIMOVIG [place]) Inject into the skin.    [provider]  fluticasone (FLONASE) 50 MCG/ACT nasal spray Place 1 spray into both nostrils 2 (two) times a day.    [provider]  Homeopathic Products (EARACHE RELIEF OT) Place in ear(s). PRN    [provider]  immune globulin, human, (GAMMAGARD S/D LESS IGA) 10 g injection Inject into the vein every 21 ( twenty-one) days.     [provider]  levothyroxine (SYNTHROID) 75 MCG tablet Take 1 tablet (75 mcg total) by mouth daily before breakfast. 01/27/19   [name], NP  lubiprostone (AMITIZA) 24 MCG capsule Take 24 mcg by mouth as needed.     [provider]  magnesium oxide (MAG-OX) 400 MG tablet Take 400 mg by mouth daily.     [provider]  Melatonin 5 MG CAPS Take 5 mg by mouth at bedtime.     [provider]  Misc Natural Products (CRAN-B-OTC PO) Take by mouth. 1 daily    [provider]  OLANZapine (ZYPREXA) 2.5 MG tablet Take 1/2 tablet at bedtime for 2-4 days, then may increase to 1 tablet at bedtime as tolerated 02/22/19   Thayer Headings, PMHNP  ondansetron (ZOFRAN-ODT) 8 MG disintegrating tablet Take 1 tablet (8 mg total) by mouth every 8 (eight) hours as needed for nausea or vomiting. 01/05/19   Armbruster, Carlota Raspberry, MD  OVER THE COUNTER MEDICATION Take 1 tablet by mouth daily. ADEK    [provider]  potassium chloride SA (K-DUR,KLOR-CON) 20 MEQ tablet Take 40 mEq by mouth daily.  05/23/13   [provider]  pyridOXINE (VITAMIN B-6) 100 MG tablet Take 100 mg by mouth daily.    [provider]  rOPINIRole (REQUIP) 0.5 MG tablet 2 by mouth daily    [provider]  sertraline (ZOLOFT) 100 MG tablet Take 100 mg by mouth 2 (two) times daily.  03/25/17   [provider]  sucralfate (CARAFATE) 1 GM/10ML suspension Take 200 mg by mouth 3 (three) times daily as needed.  Mouth sores    [provider]  tiZANidine (ZANAFLEX) 2 MG tablet Take 2 mg by mouth 3 (three) times daily.    [provider]  torsemide (DEMADEX) 20 MG tablet Take 20 mg by mouth daily.  11/04/12   [provider]  vitamin B-12 (CYANOCOBALAMIN) 1000 MCG tablet Take 1,000 mcg by mouth daily.     [provider]    Family History Family History  Problem Relation Age of Onset   Hypertension Mother    GER disease Mother    Dementia Mother  Osteoporosis Mother    Arthritis Mother    Bipolar disorder Mother    Parkinson's disease Father    Congestive Heart Failure Father    Pneumonia Father    Arthritis Father    Dementia Father    Depression Father    Asthma Sister    Depression Sister    Bipolar disorder Brother    Asthma Daughter     Social History Social History   Tobacco Use   Smoking status: Never Smoker   Smokeless tobacco: Never Used  Substance Use Topics   Alcohol use: Yes    Comment: rare   Drug use: Never     Allergies   Butorphanol tartrate, Tetracycline, Corticosteroids, Cefixime, Ciprofloxacin, Doxycycline, Gabapentin, Isometheptene-dichloral-apap, Ketorolac, Prednisone, Promethazine, Stadol [butorphanol], Erythromycin base, and Propoxyphene   Review of Systems Review of Systems  Respiratory: Positive for shortness of breath.   All other systems reviewed and are negative.    Physical Exam Updated Vital Signs BP (!) 92/51    Pulse 69    Resp 18    Ht 5\' 3"  (1.6 m)    Wt 49 kg    SpO2 97%    BMI 19.13 kg/m   Physical Exam Vitals signs and nursing note reviewed.  Constitutional:      Comments: Chronically ill   HENT:     Head: Normocephalic.     Right Ear: Tympanic membrane normal.     Left Ear: Tympanic membrane normal.     Nose: Nose normal.     Mouth/Throat:     Mouth: Mucous membranes are dry.  Eyes:     Extraocular Movements: Extraocular movements intact.     Pupils: Pupils are  equal, round, and reactive to light.  Neck:     Musculoskeletal: Normal range of motion.  Cardiovascular:     Rate and Rhythm: Normal rate.     Pulses: Normal pulses.     Heart sounds: Normal heart sounds.  Pulmonary:     Effort: Pulmonary effort is normal.     Comments: Previous scar at right chest from lung surgery  Abdominal:     General: Abdomen is flat. Bowel sounds are normal. There is no distension.     Palpations: Abdomen is soft.  Musculoskeletal: Normal range of motion.  Skin:    General: Skin is warm.     Capillary Refill: Capillary refill takes less than 2 seconds.  Neurological:     General: No focal deficit present.     Mental Status: She is alert and oriented to person, place, and time.  Psychiatric:        Mood and Affect: Mood normal.        Behavior: Behavior normal.      ED Treatments / Results  Labs (all labs ordered are listed, but only abnormal results are displayed) Labs Reviewed  CBC WITH DIFFERENTIAL/PLATELET - Abnormal; Notable for the following components:      Result Value   Platelets 142 (*)    Lymphs Abs 0.6 (*)    All other components within normal limits  COMPREHENSIVE METABOLIC PANEL - Abnormal; Notable for the following components:   BUN 30 (*)    Creatinine, Ser 1.56 (*)    GFR calc non Af Amer 33 (*)    GFR calc Af Amer 39 (*)    All other components within normal limits  LACTIC ACID, PLASMA - Abnormal; Notable for the following components:   Lactic Acid, Venous 2.1 (*)  All other components within normal limits  SARS CORONAVIRUS 2 (HOSPITAL ORDER, [facility])  URINE CULTURE  CULTURE, BLOOD (ROUTINE X 2)  CULTURE, BLOOD (ROUTINE X 2)  MAGNESIUM  URINALYSIS, ROUTINE W REFLEX MICROSCOPIC  LACTIC ACID, PLASMA  TROPONIN I (HIGH SENSITIVITY)  TROPONIN I (HIGH SENSITIVITY)    EKG EKG Interpretation  Date/Time:  Monday February 27 2019 18:30:38 EDT Ventricular Rate:  83 PR Interval:    QRS  Duration: 121 QT Interval:  417 QTC Calculation: 490 R Axis:   48 Text Interpretation:  Sinus rhythm Probable left atrial enlargement Left bundle branch block No significant change since last tracing Confirmed by Wandra Arthurs [phone]) on 02/27/2019 7:07:31 PM   Radiology Ct Angio Chest Pe W And/or Wo Contrast  Result Date: 02/27/2019 CLINICAL DATA:  Severe diaphragmatic pain beginning at 3 p.m. today. PE suspected, high pretest probability. EXAM: CT ANGIOGRAPHY CHEST WITH CONTRAST TECHNIQUE: Multidetector CT imaging of the chest was performed using the standard protocol during bolus administration of intravenous contrast. Multiplanar CT image reconstructions and MIPs were obtained to evaluate the vascular anatomy. CONTRAST:  59mL OMNIPAQUE IOHEXOL 350 MG/ML SOLN COMPARISON:  Preoperative CTA chest 01/25/2010, chest radiograph 12/10/2018 FINDINGS: Cardiovascular: Satisfactory opacification of the pulmonary arteries to the segmental level. Postsurgical truncation of the bronchus intermedius. The no evidence of pulmonary embolism. Normal heart size. No pericardial effusion. Right chest port catheter tip terminates at the superior cavoatrial junction. Mediastinum/Nodes: Postsurgical changes in the right hilum likely corresponding to prior right lower lobectomy. Rightward deviation of the mediastinal structures, expected postoperatively. Small hiatal hernia. Lungs/Pleura: Postsurgical changes from right lower lobectomy with scarring and pleural thickening in the right lung base as well as relative hyperexpansion of the remaining aerated portions of the right upper and middle lobes. No concerning nodules or masses are seen elsewhere within the lungs. No pneumothorax or left effusion. Upper Abdomen: Postsurgical changes at the diaphragmatic hiatus similar to prior study. Remainder of the upper abdomen is unremarkable. Musculoskeletal: Thoracic nerve stimulator is present. Post right thoracotomy changes including  sclerotic foci in the right 6, seventh and eighth ribs laterally Review of the MIP images confirms the above findings. IMPRESSION: 1. No evidence of pulmonary embolism. 2. Postsurgical changes from right lower lobectomy with scarring and pleural thickening in the right lung base and postthoracotomy changes in the ribs. Such findings can be normal in the postsurgical setting though would be better assessed with comparison to prior postoperative imaging as the only comparison available at the time of interpretation is the preoperative exam. 3. Right chest port and thoracic nerve stimulator in place. Electronically Signed   By: MD Lovena Le   On: 02/27/2019 20:14   Dg Chest Port 1 View  Result Date: 02/27/2019 CLINICAL DATA:  Shortness of breath EXAM: PORTABLE CHEST 1 VIEW COMPARISON:  CT from earlier in the same day. FINDINGS: Cardiac shadow is within normal limits. Left chest wall port is seen with catheter tip deep within the right atrium and stable. Spinal stimulator is again noted and stable. The lungs are well aerated. Postsurgical changes in the right base are seen. No acute infiltrate or effusion is noted. IMPRESSION: Postsurgical changes without acute abnormality. Electronically Signed   By: Inez Catalina M.D.   On: 02/27/2019 20:19    Procedures Procedures (including critical care time)  Angiocath insertion Performed by: Wandra Arthurs  Consent: Verbal consent obtained. Risks and benefits: risks, benefits and alternatives were discussed Time out: Immediately  prior to procedure a "time out" was called to verify the correct patient, procedure, equipment, support staff and site/side marked as required.  Preparation: Patient was prepped and draped in the usual sterile fashion.  Vein Location: L antecube  Ultrasound Guided  Gauge: 20 long  Normal blood return and flush without difficulty Patient tolerance: Patient tolerated the procedure well with no immediate  complications.     Medications Ordered in ED Medications  sodium chloride flush (NS) 0.9 % injection 10-40 mL (has no administration in time range)  sodium chloride flush (NS) 0.9 % injection 10-40 mL (has no administration in time range)  sodium chloride 0.9 % bolus 1,000 mL (0 mLs Intravenous Stopped 02/27/19 1925)  iohexol (OMNIPAQUE) 350 MG/ML injection 50 mL (55 mLs Intravenous Contrast Given 02/27/19 1926)     Initial Impression / Assessment and Plan / ED Course  I have reviewed the triage vital signs and the nursing notes.  Pertinent labs & imaging results that were available during my care of the patient were reviewed by me and considered in my medical decision making (see chart for details).       Bera Pinela is a 73 y.o. female here with SOB, chest pain.  Patient has a history of lung cancer status post resection.  Patient also was hypotensive initially.  Consider PE versus pneumonia versus COVID.   11:33 PM Patient's initial lactate was minimally elevated but her potassium was normal.  Patient's CTA showed no PE or pneumonia or recurrent cancer. COVID negative. Patient's blood pressure improved with IV fluids. Her delta troponin was negative.  At this point I think she is stable for discharge home.   Final Clinical Impressions(s) / ED Diagnoses   Final diagnoses:  Near syncope  Chest pain, unspecified type    ED Discharge Orders    None       Drenda Freeze, MD 02/27/19 2331    Drenda Freeze, MD 02/27/19 [phone]

## 2019-02-27 NOTE — Discharge Instructions (Signed)
Take your medicines as prescribed   See your doctor for follow up   Your COVID is negative and your ct scan showed no blood clots or pneumonia   Return to ER if you have worse chest pain, trouble breathing, fever

## 2019-02-27 NOTE — ED Notes (Signed)
Collette Pescador, husband would like to be contacted with updates [phone]

## 2019-02-27 NOTE — Telephone Encounter (Signed)
Home Health Nurse with [facility] called stating she is recommending that patient seek medical attention at the emergency room and called as a FYI. Patient is extremely weak, lethargic, pale, and thinks something is going on with her heart although she denies chest pain. Patient's B/P was 88/50. I advised nurse I will share her recommendation with Marlowe Sax, NP whom is covering for Sherrie Mustache, NP (out of office this week).

## 2019-02-27 NOTE — ED Notes (Signed)
Patient transported to CT 

## 2019-02-27 NOTE — ED Triage Notes (Signed)
Pt BIB [facility] for "diaphragm pain."  Per EMS patient's pain started around 1500 today while she was sitting. Pt has a pmh of a previous hospitalization where she had severely low potassium. Pt reports she has a history of hypotension when that happened. Per EMS patient initial blood pressure was 116/60 and then her pressure decreased to 64/48.   Pt has a pmh of lung cancer.

## 2019-02-28 NOTE — Telephone Encounter (Signed)
I agree with the decision to send patient to ED for further evaluation.Thank You.

## 2019-02-28 NOTE — ED Notes (Signed)
Patient verbalizes understanding of discharge instructions. Opportunity for questioning and answers were provided. Armband removed by staff, pt discharged from ED in wheelchair.  

## 2019-02-28 NOTE — Telephone Encounter (Signed)
Patient was in the ER x 7 hours yesterday. See ED encounter dated 02/27/2019

## 2019-03-02 ENCOUNTER — Telehealth: Payer: Self-pay | Admitting: *Deleted

## 2019-03-02 NOTE — Telephone Encounter (Signed)
Will with [facility] called with patient's blood pressure. He stated that everything is fine but he has to call when BP is out of range. Today BP was 80/56 with Pulse of 82.  Patient has an appointment for follow up with Dinah on 03/06/2019.

## 2019-03-02 NOTE — Telephone Encounter (Signed)
Will with [facility] notified and agreed and stated that he will let patient know.

## 2019-03-02 NOTE — Telephone Encounter (Signed)
Patient to check blood pressure prior to taking Furosemide.If B/p is less than 100/60 do not take Furosemide.

## 2019-03-04 LAB — CULTURE, BLOOD (ROUTINE X 2)
Culture: NO GROWTH
Culture: NO GROWTH
Special Requests: ADEQUATE

## 2019-03-06 ENCOUNTER — Emergency Department (HOSPITAL_COMMUNITY): Payer: Medicare Other

## 2019-03-06 ENCOUNTER — Encounter: Payer: Self-pay | Admitting: Family

## 2019-03-06 ENCOUNTER — Emergency Department (HOSPITAL_COMMUNITY)
Admission: EM | Admit: 2019-03-06 | Discharge: 2019-03-06 | Disposition: A | Discharge status: Home / Self Care | Payer: Medicare Other | Attending: Emergency Medicine | Admitting: Emergency Medicine

## 2019-03-06 ENCOUNTER — Encounter (HOSPITAL_COMMUNITY): Payer: Self-pay | Admitting: Family Medicine

## 2019-03-06 ENCOUNTER — Other Ambulatory Visit: Payer: Self-pay

## 2019-03-06 ENCOUNTER — Ambulatory Visit (INDEPENDENT_AMBULATORY_CARE_PROVIDER_SITE_OTHER): Payer: Medicare Other | Admitting: Family

## 2019-03-06 VITALS — BP 110/80 | HR 79 | Temp 97.4°F | Ht 63.0 in | Wt 112.8 lb

## 2019-03-06 DIAGNOSIS — R531 Weakness: Secondary | ICD-10-CM | POA: Diagnosis not present

## 2019-03-06 DIAGNOSIS — E039 Hypothyroidism, unspecified: Secondary | ICD-10-CM | POA: Insufficient documentation

## 2019-03-06 DIAGNOSIS — F329 Major depressive disorder, single episode, unspecified: Secondary | ICD-10-CM

## 2019-03-06 DIAGNOSIS — G4700 Insomnia, unspecified: Secondary | ICD-10-CM

## 2019-03-06 DIAGNOSIS — Z85118 Personal history of other malignant neoplasm of bronchus and lung: Secondary | ICD-10-CM | POA: Diagnosis not present

## 2019-03-06 DIAGNOSIS — I129 Hypertensive chronic kidney disease with stage 1 through stage 4 chronic kidney disease, or unspecified chronic kidney disease: Secondary | ICD-10-CM | POA: Diagnosis not present

## 2019-03-06 DIAGNOSIS — R1084 Generalized abdominal pain: Secondary | ICD-10-CM

## 2019-03-06 DIAGNOSIS — G8922 Chronic post-thoracotomy pain: Secondary | ICD-10-CM

## 2019-03-06 DIAGNOSIS — R600 Localized edema: Secondary | ICD-10-CM | POA: Diagnosis not present

## 2019-03-06 DIAGNOSIS — N189 Chronic kidney disease, unspecified: Secondary | ICD-10-CM | POA: Diagnosis not present

## 2019-03-06 DIAGNOSIS — I499 Cardiac arrhythmia, unspecified: Secondary | ICD-10-CM | POA: Diagnosis not present

## 2019-03-06 LAB — CBC WITH DIFFERENTIAL/PLATELET
Abs Immature Granulocytes: 0.01 10*3/uL (ref 0.00–0.07)
Basophils Absolute: 0 10*3/uL (ref 0.0–0.1)
Basophils Relative: 0 %
Eosinophils Absolute: 0 10*3/uL (ref 0.0–0.5)
Eosinophils Relative: 1 %
HCT: 33.6 % — ABNORMAL LOW (ref 36.0–46.0)
Hemoglobin: 10.6 g/dL — ABNORMAL LOW (ref 12.0–15.0)
Immature Granulocytes: 0 %
Lymphocytes Relative: 21 %
Lymphs Abs: 1 10*3/uL (ref 0.7–4.0)
MCH: 28 pg (ref 26.0–34.0)
MCHC: 31.5 g/dL (ref 30.0–36.0)
MCV: 88.9 fL (ref 80.0–100.0)
Monocytes Absolute: 0.4 10*3/uL (ref 0.1–1.0)
Monocytes Relative: 8 %
Neutro Abs: 3.4 10*3/uL (ref 1.7–7.7)
Neutrophils Relative %: 70 %
Platelets: 139 10*3/uL — ABNORMAL LOW (ref 150–400)
RBC: 3.78 MIL/uL — ABNORMAL LOW (ref 3.87–5.11)
RDW: 13.3 % (ref 11.5–15.5)
WBC: 4.9 10*3/uL (ref 4.0–10.5)
nRBC: 0 % (ref 0.0–0.2)

## 2019-03-06 LAB — COMPREHENSIVE METABOLIC PANEL
ALT: 15 U/L (ref 0–44)
AST: 22 U/L (ref 15–41)
Albumin: 3.4 g/dL — ABNORMAL LOW (ref 3.5–5.0)
Alkaline Phosphatase: 51 U/L (ref 38–126)
Anion gap: 10 (ref 5–15)
BUN: 34 mg/dL — ABNORMAL HIGH (ref 8–23)
CO2: 26 mmol/L (ref 22–32)
Calcium: 8.8 mg/dL — ABNORMAL LOW (ref 8.9–10.3)
Chloride: 101 mmol/L (ref 98–111)
Creatinine, Ser: 1.38 mg/dL — ABNORMAL HIGH (ref 0.44–1.00)
GFR calc Af Amer: 45 mL/min — ABNORMAL LOW (ref >60)
GFR calc non Af Amer: 39 mL/min — ABNORMAL LOW (ref >60)
Glucose, Bld: 93 mg/dL (ref 70–99)
Potassium: 4.3 mmol/L (ref 3.5–5.1)
Sodium: 137 mmol/L (ref 135–145)
Total Bilirubin: 0.3 mg/dL (ref 0.3–1.2)
Total Protein: 6.7 g/dL (ref 6.5–8.1)

## 2019-03-06 MED ORDER — SODIUM CHLORIDE 0.9 % IV BOLUS
1000.0000 mL | Freq: Once | INTRAVENOUS | Status: AC
  Administered 2019-03-06: 17:00:00 1000 mL via INTRAVENOUS

## 2019-03-06 MED ORDER — HEPARIN SOD (PORK) LOCK FLUSH 100 UNIT/ML IV SOLN
500.0000 [IU] | Freq: Once | INTRAVENOUS | Status: AC
  Administered 2019-03-06: 500 [IU]
  Filled 2019-03-06: qty 5

## 2019-03-06 NOTE — Patient Instructions (Signed)
Keep legs elevated while seated if possible above heart level to keep the swelling on the legs down.

## 2019-03-06 NOTE — Progress Notes (Signed)
Provider: Jatavius Ellenwood FNP-C  [name], NP  Patient Care Team: [name], NP as PCP - General (Geriatric Medicine)  Extended Emergency Contact Information Primary Emergency Contact: [facility] Address: [address] [place] Phone: [phone] Mobile Phone: [phone] Relation: Spouse  Code Status:  Full Code  Goals of care: Advanced Directive information Advanced Directives 02/27/2019  Does Patient Have a Medical Advance Directive? No  Type of Advance Directive -  Copy of [place] in Chart? -  Would patient like information on creating a medical advance directive? No - Patient declined     Chief Complaint  Patient presents with   Follow-up    ER follow up on dehydration     HPI:  Pt is a 73 y.o. female seen today for an acute visit for follow up ED visit 02/27/19 for near syncope and pleuritic chest pain .she was hypotensive at home when SB/p in the 80's checked by [facility].EMS was called by the time EMS arrived SB/p was in the 60's and glucose level was 80.Her CBC/diff showed no signs of infections or anemia.Platelets were low 142, BUN 30,CR 1.56  Her lactate level was slightly elevated.Troponin was negative.Her blood cultures showed no growth.COVID-19 test was negative.Her blood pressure improved with hydration.she had a chest CT scan which showed no PE,pneumonia or recurrent lung cancer.EKG showed Sinus Rhythm with left arterial bundle branch block.she states was told to see a cardiology for her irregular heart rate.she states feeling much better since discharge home.she states chronic loss appetite but does not want any medication to help with her appetite. Depression - chronic.on sertraline 100 mg tablet twice daily.she denies worsening of symptoms.she states was referred to counselor by her PCP but has not been able to have a visit due to COVID-19 restriction.   Leg edema - on torsemide  20 mg tablet daily.states edema better today.Has had no abrupt weight gain or shortness of breath.  Insomnia - slept 5 hours at night though states sometimes does not sleep.Takes melatonin 5 mg tablet at bedtime.No new medication desired.      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[place])    Lung cancer ([place]) 2011, 2014   Memory loss    Migraine    Opioid abuse ([place])    Osteoporosis    Presence of neurostimulator    Recurrent upper respiratory infection (URI)    Restless legs    Sleep apnea    Tremor    Urge incontinence    Urticaria    long time ago, nothing recent   Past Surgical History:  Procedure Laterality Date   ABDOMINAL HYSTERECTOMY     CARPAL TUNNEL RELEASE     CATARACT EXTRACTION Bilateral 2019   CHOLECYSTECTOMY     COLONOSCOPY  2015   [facility]   CT LUNG SCREENING  2018   DG  BONE DENSITY ([place] HX)  2018   DIAGNOSTIC MAMMOGRAM  2019   GASTRIC BYPASS     LUNG CANCER SURGERY  02/2010   SPINAL CORD STIMULATOR IMPLANT  Allergies  Allergen Reactions   Butorphanol Tartrate Other (See Comments)    "like someone is brushing my brain with a brillo pad"   Tetracycline Nausea Only    Causes ringing in ears, dizziness, migraine, nausea   Corticosteroids Other (See Comments)    12/02/2016 interview by WEX: Oral dosing causes migraines/nausea/tinnitus. Prev tolerated IV and intranasal admin w/o difficulty.   Cefixime Other (See Comments)    Headache    Ciprofloxacin Other (See Comments)    Headache, dizziness, ringing in the ears   Doxycycline Other (See Comments)    unknown   Gabapentin     Throat swells/closes    Isometheptene-Dichloral-Apap Other (See Comments)    unknown   Ketorolac     12/02/2016 interview by HBZ: Oral dosing prednisone/corticosteroids causes migraines/nausea/tinnitus. Prev tolerated IV and intranasal admin w/o difficulty.   Prednisone Other (See Comments)    Migraine, dizzy, ringing in ears-can take it IV 12/02/2016 interview by [name]: Oral prednisone dosing causes migraines/nausea/tinnitus. Prev tolerated IV and intranasal admin w/o difficulty.   Promethazine Other (See Comments)    causes severe abdominal pain when taken IV; however she can take PO or IM   Stadol [Butorphanol]     Cerebral pain   Erythromycin Base Diarrhea and Itching    Severe diarrhea   Propoxyphene Nausea Only    Outpatient Encounter Medications as of 03/06/2019  Medication Sig   acetaminophen (TYLENOL) 650 MG CR tablet Take 1,300 mg by mouth every 8 (eight) hours as needed for pain or fever.    Apoaequorin (PREVAGEN PO) 1 by mouth daily   Bacillus Coagulans-Inulin (ALIGN PREBIOTIC-PROBIOTIC PO) 1 by mouth daily   Bismuth Subsalicylate (PEPTO-BISMOL PO) 1-2 daily as needed    Buprenorphine HCl-Naloxone HCl (SUBOXONE) 4-1 MG FILM Place under the tongue. 1 daily   Calcium Citrate 200 MG TABS Take 950 mg by mouth daily.    cycloSPORINE (RESTASIS) 0.05 % ophthalmic emulsion Place 1 drop into both eyes 2 (two) times daily.   dexlansoprazole (DEXILANT) 60 MG capsule Take 1 capsule (60 mg total) by mouth daily.   Erenumab-aooe (AIMOVIG [place]) Inject into the skin.   Homeopathic Products (EARACHE RELIEF OT) Place in ear(s). PRN   immune globulin, human, (GAMMAGARD S/D LESS IGA) 10 g injection Inject into the vein every 21 ( twenty-one) days.    levothyroxine (SYNTHROID) 75 MCG tablet Take 1 tablet (75 mcg total) by mouth daily before breakfast.   lubiprostone (AMITIZA) 24 MCG capsule Take 24 mcg by mouth as needed.    magnesium oxide (MAG-OX) 400 MG tablet Take 400 mg by mouth daily.     Melatonin 5 MG CAPS Take 5 mg by mouth at bedtime.    Misc Natural Products (CRAN-B-OTC PO) Take by mouth. 1 daily   OLANZapine (ZYPREXA) 2.5 MG tablet Take 1/2 tablet at bedtime for 2-4 days, then may increase to 1 tablet at bedtime as tolerated   ondansetron (ZOFRAN-ODT) 8 MG disintegrating tablet Take 1 tablet (8 mg total) by mouth every 8 (eight) hours as needed for nausea or vomiting.   potassium chloride SA (K-DUR,KLOR-CON) 20 MEQ tablet Take 40 mEq by mouth daily.    pyridOXINE (VITAMIN B-6) 100 MG tablet Take 100 mg by mouth daily.   rOPINIRole (REQUIP) 0.5 MG tablet 2 by mouth daily   sucralfate (CARAFATE) 1 GM/10ML suspension Take 200 mg by mouth 3 (three) times daily as needed. Mouth sores   tiZANidine (ZANAFLEX) 2 MG tablet Take 2 mg by mouth 3 (  three) times daily as needed.    torsemide (DEMADEX) 20 MG tablet Take 20 mg by mouth daily.    vitamin B-12 (CYANOCOBALAMIN) 1000 MCG tablet Take 1,000 mcg by mouth daily.    OVER THE COUNTER MEDICATION Take 1 tablet by mouth daily. ADEK   sertraline (ZOLOFT) 100 MG tablet Take 100 mg by mouth 2 (two) times daily.    [DISCONTINUED] fluticasone (FLONASE) 50 MCG/ACT nasal spray Place 1 spray into both nostrils 2 (two) times a day.   No facility-administered encounter medications on file as of 03/06/2019.     Review of Systems  Constitutional: Positive for appetite change. Negative for chills and fever.  HENT: Negative for congestion, postnasal drip, rhinorrhea, sinus pressure, sinus pain, sneezing and sore throat.   Eyes: Negative for discharge, redness and itching.  Respiratory: Negative for cough, chest tightness, shortness of breath and wheezing.   Cardiovascular: Positive for leg swelling. Negative for chest pain and palpitations.  Gastrointestinal: Negative for abdominal distention.       Intermittent abdominal pain has GI follow up in September.Has diarrhea/constipation.some nausea and vomiting with potasium but now takes  one twice daily which seems to help.  Endocrine: Negative for cold intolerance, heat intolerance, polydipsia, polyphagia and polyuria.  Genitourinary: Negative for dysuria.       Chronic urine frequency,urgency and urine retention has upcoming appointment with urology.   Musculoskeletal: Positive for gait problem.  Skin: Negative for color change, pallor, rash and wound.  Neurological: Negative for dizziness, weakness, light-headedness, numbness and headaches.  Hematological: Does not bruise/bleed easily.  Psychiatric/Behavioral: Positive for sleep disturbance. Negative for agitation, behavioral problems, confusion and hallucinations.    Immunization History  Administered Date(s) Administered   Influenza, Seasonal, Injecte, Preservative Fre 11/14/2012   Influenza,inj,Quad PF,6+ Mos 05/24/2018   Influenza-Unspecified 04/17/2016, 06/09/2017   Pneumococcal Conjugate-13 07/29/2017   Pneumococcal Polysaccharide-23 07/29/2017   Tdap 05/30/2017   Zoster Recombinat (Shingrix) 06/17/2017   Pertinent  Health Maintenance Due  Topic Date Due   MAMMOGRAM  07/12/1998   COLONOSCOPY  07/12/1998   DEXA SCAN  07/12/2013   PNA vac Low Risk Adult (2 of 2 - PCV13) 07/29/2018   INFLUENZA VACCINE  03/18/2019   Fall Risk  03/06/2019 01/27/2019  Falls in the past year? 0 0  Number falls in past yr: 0 0  Injury with Fall? 0 0    Vitals:   03/06/19 1034  BP: 110/80  Pulse: 79  Temp: (!) 97.4 F (36.3 C)  TempSrc: Oral  SpO2: 99%  Weight: 112 lb 12.8 oz (51.2 kg)  Height: '5\' 3"'$  (1.6 m)   Body mass index is 19.98 kg/m. Physical Exam Constitutional:      General: She is not in acute distress.    Appearance: She is normal weight. She is not ill-appearing.  HENT:     Head: Normocephalic.     Mouth/Throat:     Mouth: Mucous membranes are moist.     Pharynx: Oropharynx is clear. No oropharyngeal exudate or posterior oropharyngeal erythema.  Eyes:     General: No scleral icterus.         Right eye: No discharge.        Left eye: No discharge.     Conjunctiva/sclera: Conjunctivae normal.     Pupils: Pupils are equal, round, and reactive to light.  Neck:     Musculoskeletal: Normal range of motion. No neck rigidity or muscular tenderness.  Cardiovascular:     Rate and Rhythm: Normal rate.  Rhythm irregular.     Pulses: Normal pulses.     Heart sounds: No murmur. No friction rub. No gallop.   Pulmonary:     Effort: Pulmonary effort is normal. No respiratory distress.     Breath sounds: Normal breath sounds. No wheezing, rhonchi or rales.  Chest:     Chest wall: No tenderness.  Abdominal:     General: Bowel sounds are normal. There is no distension.     Palpations: Abdomen is soft. There is no mass.     Tenderness: There is guarding. There is no rebound.     Comments: Chronic generalized abdominal tenderness with light palpation patient guarding.right CVA chronic tenderness.   Musculoskeletal:        General: No tenderness.     Comments: Unsteady gait walks with a cane.bilateral lower extremities trace - 1 + edema.   Lymphadenopathy:     Cervical: No cervical adenopathy.  Skin:    General: Skin is warm and dry.     Coloration: Skin is not pale.     Findings: No bruising, erythema or rash.  Neurological:     Mental Status: She is alert and oriented to person, place, and time.     Cranial Nerves: No cranial nerve deficit.     Sensory: No sensory deficit.     Motor: No weakness.     Gait: Gait abnormal.  Psychiatric:        Mood and Affect: Mood normal.        Behavior: Behavior normal.        Thought Content: Thought content normal.        Judgment: Judgment normal.     Labs reviewed: Recent Labs    01/27/19 1048 02/27/19 1745  NA 142 142  K 4.5 4.1  CL 104 102  CO2 29 29  GLUCOSE 70 83  BUN 25 30*  CREATININE 1.00* 1.56*  CALCIUM 9.3 9.3  MG  --  2.0   Recent Labs    01/27/19 1048 02/27/19 1745  AST 21 23  ALT 15 17  ALKPHOS  --  60   BILITOT 0.4 0.3  PROT 7.3 6.5  ALBUMIN  --  3.5   Recent Labs    01/27/19 1048 02/27/19 1745  WBC 8.4 6.7  NEUTROABS 5,452 5.3  HGB 13.4 12.5  HCT 39.5 39.0  MCV 84.4 88.4  PLT 225 142*   Lab Results  Component Value Date   TSH 4.58 (H) 01/27/2019   No results found for: HGBA1C Lab Results  Component Value Date   CHOL  01/27/2010    144        ATP III CLASSIFICATION:  <200     mg/dL   Desirable  200-239  mg/dL   Borderline High  >=240    mg/dL   High          HDL 58 01/27/2010   LDLCALC  01/27/2010    72        Total Cholesterol/HDL:CHD Risk Coronary Heart Disease Risk Table                     Men   Women  1/2 Average Risk   3.4   3.3  Average Risk       5.0   4.4  2 X Average Risk   9.6   7.1  3 X Average Risk  23.4   11.0        Use the calculated  Patient Ratio above and the CHD Risk Table to determine the patient's CHD Risk.        ATP III CLASSIFICATION (LDL):  <100     mg/dL   Optimal  100-129  mg/dL   Near or Above                    Optimal  130-159  mg/dL   Borderline  160-189  mg/dL   High  >190     mg/dL   Very High   TRIG 68 01/27/2010   CHOLHDL 2.5 01/27/2010    Significant Diagnostic Results in last 30 days:  Ct Angio Chest Pe W And/or Wo Contrast  Result Date: 02/27/2019 CLINICAL DATA:  Severe diaphragmatic pain beginning at 3 p.m. today. PE suspected, high pretest probability. EXAM: CT ANGIOGRAPHY CHEST WITH CONTRAST TECHNIQUE: Multidetector CT imaging of the chest was performed using the standard protocol during bolus administration of intravenous contrast. Multiplanar CT image reconstructions and MIPs were obtained to evaluate the vascular anatomy. CONTRAST:  6m OMNIPAQUE IOHEXOL 350 MG/ML SOLN COMPARISON:  Preoperative CTA chest 01/25/2010, chest radiograph 12/10/2018 FINDINGS: Cardiovascular: Satisfactory opacification of the pulmonary arteries to the segmental level. Postsurgical truncation of the bronchus intermedius. The no evidence  of pulmonary embolism. Normal heart size. No pericardial effusion. Right chest port catheter tip terminates at the superior cavoatrial junction. Mediastinum/Nodes: Postsurgical changes in the right hilum likely corresponding to prior right lower lobectomy. Rightward deviation of the mediastinal structures, expected postoperatively. Small hiatal hernia. Lungs/Pleura: Postsurgical changes from right lower lobectomy with scarring and pleural thickening in the right lung base as well as relative hyperexpansion of the remaining aerated portions of the right upper and middle lobes. No concerning nodules or masses are seen elsewhere within the lungs. No pneumothorax or left effusion. Upper Abdomen: Postsurgical changes at the diaphragmatic hiatus similar to prior study. Remainder of the upper abdomen is unremarkable. Musculoskeletal: Thoracic nerve stimulator is present. Post right thoracotomy changes including sclerotic foci in the right 6, seventh and eighth ribs laterally Review of the MIP images confirms the above findings. IMPRESSION: 1. No evidence of pulmonary embolism. 2. Postsurgical changes from right lower lobectomy with scarring and pleural thickening in the right lung base and postthoracotomy changes in the ribs. Such findings can be normal in the postsurgical setting though would be better assessed with comparison to prior postoperative imaging as the only comparison available at the time of interpretation is the preoperative exam. 3. Right chest port and thoracic nerve stimulator in place. Electronically Signed   By: MD PLovena Le  On: 02/27/2019 20:14   Dg Chest Port 1 View  Result Date: 02/27/2019 CLINICAL DATA:  Shortness of breath EXAM: PORTABLE CHEST 1 VIEW COMPARISON:  CT from earlier in the same day. FINDINGS: Cardiac shadow is within normal limits. Left chest wall port is seen with catheter tip deep within the right atrium and stable. Spinal stimulator is again noted and stable. The lungs are  well aerated. Postsurgical changes in the right base are seen. No acute infiltrate or effusion is noted. IMPRESSION: Postsurgical changes without acute abnormality. Electronically Signed   By: MInez CatalinaM.D.   On: 02/27/2019 20:19    Assessment/Plan 1. Lower leg edema Chronic.No cough,shortness of breath or wheezing noted.Weight stable.continue on torsemide 20 mg tablet daily.on potassium supplement.  - CBC with Differential/Platelet - BMP with eGFR([facility])  2. Irregular heart rate EKG reviewed 02/27/2019 SR with left arterial BBB.chronic post thoracotomy chest tenderness.  -  CBC with Differential/Platelet - BMP with eGFR([facility]) - Ambulatory referral to Cardiology  3. Major depressive disorder with current active episode, unspecified depression episode severity, unspecified whether recurrent Mood stable.continue on sertraline 100 mg tablet twice daily.  4. Generalized abdominal pain Chronic generalized abdominal tenderness with slight palpation.Has upcoming appointment with Gastroenterology.   5. Insomnia, unspecified type Continue on melatonin 5 mg tablet at bedtime.  6. Chronic post-thoracotomy pain CT scan at the ED showed no lung cancer recurrent.  Family/ staff Communication: Reviewed plan of care with patient  Labs/tests ordered: CBC/diff and BMP today.   Sandrea Hughs, NP

## 2019-03-06 NOTE — ED Triage Notes (Signed)
Patient is from home and transported via [facility]. Patient's spouse called emergency services for generalized weakness that started around 14:00 this evening . Patient's reported to EMS patient and him had been out today and had lunch. Patient had soft pressure of 90/50, but reported to EMS that is her normal pressure.

## 2019-03-06 NOTE — ED Provider Notes (Signed)
[facility] DEPT Provider Note   CSN: [id number] Arrival date & time: 03/06/19  1544     History   Chief Complaint Chief Complaint  Patient presents with   Weakness    HPI Megan Brewer is a 73 y.o. female.     HPI 73 year old female who is feeling rather well this morning and went to her doctor for follow-up as she had recently been hospitalized for generalized weakness.  She had a reasonable follow-up appointment with her doctor and felt well.  Her husband and her went to lunch at which point she felt extremely tired after lunch.  Husband reports that she had ambulated around [place] for approximately 1 hour.  He reports this is a rather excessive amount of activity today for her as she normally just sits around in the house.  He states that she was more sleepy and more tired and requested to be evaluated in the emergency department.  On arrival to emergency department the patient is without significant complaints.  She states she feels tired and generally weak.  She denies fevers and chills.  Denies vomiting.  No diarrhea.  No urinary complaints.  Symptoms are mild to moderate in severity.  She feels like she wants to fall asleep.   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[place])    Lung cancer ([place]) 2011, 2014   Memory loss    Migraine    Opioid abuse ([place])    Osteoporosis    Presence of neurostimulator    Recurrent upper respiratory infection (URI)    Restless legs    Sleep apnea    Tremor    Urge incontinence    Urticaria    long time ago, nothing recent    Patient Active Problem List   Diagnosis Date Noted   Weight loss  01/27/2019   Acquired hypothyroidism 01/27/2019   Esophagitis 01/27/2019   Intractable migraine without status migrainosus 01/27/2019   Osteoporosis 01/27/2019   Chronic sinusitis 12/28/2018   Food intolerance/GI symptoms 12/28/2018   Acute maxillary sinusitis 12/28/2018   Lower leg edema 12/20/2018   ADENOCARCINOMA, RIGHT LUNG 01/31/2010   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      Home Medications    Prior to Admission medications   Medication Sig Start Date End Date Taking? Authorizing Provider  acetaminophen (TYLENOL) 650 MG CR tablet Take 1,300 mg by mouth every 8 (eight) hours as needed for pain or fever.    Yes [provider]  Apoaequorin (PREVAGEN PO) Take 1 capsule by mouth daily. 1 by mouth daily   Yes [provider]  Bacillus Coagulans-Inulin (ALIGN PREBIOTIC-PROBIOTIC PO) Take 1 capsule by mouth daily. 1 by mouth daily   Yes [provider]  Buprenorphine HCl-Naloxone HCl (SUBOXONE) 4-1 MG FILM Place 1 Film under the tongue daily. 1 daily   Yes [provider]  Calcium Citrate 200 MG TABS Take 1 tablet by mouth daily.  12/02/16  Yes [provider]  cycloSPORINE (RESTASIS) 0.05 % ophthalmic emulsion Place 1 drop into both eyes 2 (two) times daily.   Yes [provider]  dexlansoprazole (DEXILANT) 60 MG capsule Take 1 capsule (60 mg total) by mouth daily. 01/25/19  Yes  Armbruster, Carlota Raspberry, MD  Erenumab-aooe (AIMOVIG [place]) Inject into the skin every 30 (thirty) days.    Yes [provider]  levothyroxine (SYNTHROID) 75 MCG tablet Take 1 tablet (75 mcg total) by mouth daily before breakfast. 01/27/19  Yes [name], NP  lubiprostone (AMITIZA) 24 MCG capsule Take 24 mcg by mouth daily as needed for constipation.    Yes [provider]  magnesium oxide (MAG-OX) 400 MG tablet Take 400 mg by mouth daily.    Yes [provider]  Misc Natural Products (CRAN-B-OTC PO) Take 1 capsule by mouth daily. 1 daily    Yes [provider]  OLANZapine (ZYPREXA) 2.5 MG tablet Take 1/2 tablet at bedtime for 2-4 days, then may increase to 1 tablet at bedtime as tolerated Patient taking differently: Take 2.5 mg by mouth at bedtime.  02/22/19  Yes Thayer Headings, PMHNP  potassium chloride SA (K-DUR,KLOR-CON) 20 MEQ tablet Take 40 mEq by mouth daily.  05/23/13  Yes [provider]  pyridOXINE (VITAMIN B-6) 100 MG tablet Take 100 mg by mouth daily.   Yes [provider]  rOPINIRole (REQUIP) 0.5 MG tablet Take 0.5 mg by mouth 2 (two) times a day.    Yes [provider]  tiZANidine (ZANAFLEX) 2 MG tablet Take 2 mg by mouth 3 (three) times daily as needed for muscle spasms.    Yes [provider]  torsemide (DEMADEX) 20 MG tablet Take 20 mg by mouth daily.  11/04/12  Yes [provider]  vitamin B-12 (CYANOCOBALAMIN) 1000 MCG tablet Take 1,000 mcg by mouth daily.    Yes [provider]  immune globulin, human, (GAMMAGARD S/D LESS IGA) 10 g injection Inject into the vein every 21 ( twenty-one) days.     [provider]  Melatonin 5 MG CAPS Take 5 mg by mouth at bedtime as needed (sleep).     [provider]  ondansetron (ZOFRAN-ODT) 8 MG disintegrating tablet Take 1 tablet (8 mg total) by mouth every 8 (eight) hours as needed for nausea or vomiting. 01/05/19   Armbruster, Carlota Raspberry, MD    sucralfate (CARAFATE) 1 GM/10ML suspension Take 200 mg by mouth 3 (three) times daily as needed (indigestion).     [provider]    Family History Family History  Problem Relation Age of Onset   Hypertension Mother    GER disease Mother    Dementia Mother    Osteoporosis Mother    Arthritis Mother    Bipolar disorder Mother    Parkinson's disease Father    Congestive Heart Failure Father    Pneumonia Father    Arthritis Father    Dementia Father    Depression Father    Asthma Sister    Depression  Sister    Bipolar disorder Brother    Asthma Daughter     Social History Social History   Tobacco Use   Smoking status: Never Smoker   Smokeless tobacco: Never Used  Substance Use Topics   Alcohol use: Not Currently   Drug use: Never     Allergies   Butorphanol tartrate, Tetracycline, Corticosteroids, Cefixime, Ciprofloxacin, Doxycycline, Gabapentin, Isometheptene-dichloral-apap, Ketorolac, Prednisone, Promethazine, Stadol [butorphanol], Erythromycin base, and Propoxyphene   Review of Systems Review of Systems  All other systems reviewed and are negative.    Physical Exam Updated Vital Signs BP 113/74 (BP Location: Right Arm)    Pulse 83    Temp 98.5 F (36.9 C) (Rectal)    Resp 18    SpO2 97%   Physical Exam Vitals signs and nursing note reviewed.  Constitutional:      General: She is not in acute distress.    Appearance: She is well-developed.  HENT:     Head: Normocephalic and atraumatic.  Neck:     Musculoskeletal: Normal range of motion.  Cardiovascular:     Rate and Rhythm: Normal rate and regular rhythm.     Heart sounds: Normal heart sounds.  Pulmonary:     Effort: Pulmonary effort is normal.     Breath sounds: Normal breath sounds.  Abdominal:     General: There is no distension.     Palpations: Abdomen is soft.     Tenderness: There is no abdominal tenderness.  Musculoskeletal: Normal range of motion.  Skin:     General: Skin is warm and dry.  Neurological:     Mental Status: She is alert and oriented to person, place, and time.  Psychiatric:        Judgment: Judgment normal.      ED Treatments / Results  Labs (all labs ordered are listed, but only abnormal results are displayed) Labs Reviewed  CBC WITH DIFFERENTIAL/PLATELET - Abnormal; Notable for the following components:      Result Value   RBC 3.78 (*)    Hemoglobin 10.6 (*)    HCT 33.6 (*)    Platelets 139 (*)    All other components within normal limits  COMPREHENSIVE METABOLIC PANEL - Abnormal; Notable for the following components:   BUN 34 (*)    Creatinine, Ser 1.38 (*)    Calcium 8.8 (*)    Albumin 3.4 (*)    GFR calc non Af Amer 39 (*)    GFR calc Af Amer 45 (*)    All other components within normal limits  CULTURE, BLOOD (ROUTINE X 2)  CULTURE, BLOOD (ROUTINE X 2)    EKG EKG Interpretation  Date/Time:  Monday March 06 2019 16:09:02 EDT Ventricular Rate:  69 PR Interval:    QRS Duration: 120 QT Interval:  422 QTC Calculation: 453 R Axis:   28 Text Interpretation:  Sinus rhythm Incomplete left bundle branch block Anterior Q waves, possibly due to ILBBB since last tracing no significant change Confirmed by Daleen Bo [id number]) on 03/06/2019 4:45:46 PM   Radiology Dg Chest Portable 1 View  Result Date: 03/06/2019 CLINICAL DATA:  77-year-old with acute onset of generalized weakness that began about 2 o'clock this afternoon. Prior RIGHT LOWER lobectomy for lung cancer. EXAM: PORTABLE CHEST 1 VIEW COMPARISON:  02/27/2019 and earlier, including CTA chest 02/27/2019. FINDINGS: Cardiac silhouette normal in size, unchanged. Thoracic aorta mildly atherosclerotic, unchanged. Hilar and mediastinal contours otherwise unremarkable. LEFT subclavian Port-A-Cath tip projects at or near the cavoatrial  junction, unchanged. DORSAL cord stimulating device again noted overlying the mid thoracic region. Post surgical changes related to  RIGHT LOWER lobectomy with pleuroparenchymal scarring. Lungs otherwise clear. Pulmonary vascularity normal. No visible pleural effusions. IMPRESSION: No acute cardiopulmonary disease. Electronically Signed   By: Evangeline Dakin M.D.   On: 03/06/2019 16:52    Procedures Procedures (including critical care time)  Medications Ordered in ED Medications  sodium chloride 0.9 % bolus 1,000 mL (0 mLs Intravenous Stopped 03/06/19 2023)  heparin lock flush 100 unit/mL (500 Units Intracatheter Given 03/06/19 2132)     Initial Impression / Assessment and Plan / ED Course  I have reviewed the triage vital signs and the nursing notes.  Pertinent labs & imaging results that were available during my care of the patient were reviewed by me and considered in my medical decision making (see chart for details).        Overall well-appearing.  Observed in the emergency department for several hours.  She began feeling much better and requested discharge home.  Her work-up in the ER thus far is without significant abnormality.  We were still awaiting a urinalysis however the patient reports that she does not need to urinate and would prefer to be discharged home at this time.  She has no dysuria or urinary frequency.  I think this is reasonable.  I recommended close follow-up with her primary care physician.  She understands return to the emergency department for new or worsening symptoms  Final Clinical Impressions(s) / ED Diagnoses   Final diagnoses:  Generalized weakness    ED Discharge Orders    None       Jola Schmidt, MD 03/06/19 [phone]

## 2019-03-06 NOTE — ED Notes (Signed)
Patient is requesting to go home, she states she is feeling better. Dr. Venora Maples is aware and going to speak with patient.

## 2019-03-06 NOTE — ED Notes (Signed)
Still unable to provide urine sample via pure wick. Pt aware that we will need to I/O if we can't get a sample but said "I don't have to consent to it if I don't want to."

## 2019-03-06 NOTE — ED Notes (Signed)
Patient is more alert, oriented x 4, and able to talk about current events. She didn't want to have an In & Out Cath. Spoke with Dr. Venora Maples about her decision and he stated we could obtain urine from an external catheter.

## 2019-03-07 LAB — BASIC METABOLIC PANEL WITH GFR
BUN/Creatinine Ratio: 21 (calc) (ref 6–22)
BUN: 29 mg/dL — ABNORMAL HIGH (ref 7–25)
CO2: 31 mmol/L (ref 20–32)
Calcium: 9.8 mg/dL (ref 8.6–10.4)
Chloride: 99 mmol/L (ref 98–110)
Creat: 1.4 mg/dL — ABNORMAL HIGH (ref 0.60–0.93)
GFR, Est African American: 44 mL/min/{1.73_m2} — ABNORMAL LOW (ref >=60)
GFR, Est Non African American: 38 mL/min/{1.73_m2} — ABNORMAL LOW (ref >=60)
Glucose, Bld: 92 mg/dL (ref 65–139)
Potassium: 4.2 mmol/L (ref 3.5–5.3)
Sodium: 139 mmol/L (ref 135–146)

## 2019-03-07 LAB — CBC WITH DIFFERENTIAL/PLATELET
Absolute Monocytes: 539 cells/uL (ref 200–950)
Basophils Absolute: 50 cells/uL (ref 0–200)
Basophils Relative: 0.8 %
Eosinophils Absolute: 50 cells/uL (ref 15–500)
Eosinophils Relative: 0.8 %
HCT: 37.4 % (ref 35.0–45.0)
Hemoglobin: 12.3 g/dL (ref 11.7–15.5)
Lymphs Abs: 1438 cells/uL (ref 850–3900)
MCH: 28.4 pg (ref 27.0–33.0)
MCHC: 32.9 g/dL (ref 32.0–36.0)
MCV: 86.4 fL (ref 80.0–100.0)
MPV: 11.5 fL (ref 7.5–12.5)
Monocytes Relative: 8.7 %
Neutro Abs: 4123 cells/uL (ref 1500–7800)
Neutrophils Relative %: 66.5 %
Platelets: 199 10*3/uL (ref 140–400)
RBC: 4.33 10*6/uL (ref 3.80–5.10)
RDW: 13.3 % (ref 11.0–15.0)
Total Lymphocyte: 23.2 %
WBC: 6.2 10*3/uL (ref 3.8–10.8)

## 2019-03-10 ENCOUNTER — Telehealth: Payer: Self-pay | Admitting: *Deleted

## 2019-03-10 NOTE — Telephone Encounter (Signed)
Pt calling to check on her cardiology referral, ( this has been done and sent to cardiology) but she also is asking for a referral for her feet and ankles, pt states she's having swelling, pain, hammer toe, bunions and plantar fascitis. I advised to pt that she may need an appt first to discuss with [name], but pt wanted message sent to [facility] first.

## 2019-03-10 NOTE — Telephone Encounter (Signed)
Patient notified and agreed.  Appointment scheduled for Tuesday with [place].

## 2019-03-10 NOTE — Telephone Encounter (Signed)
Will need to make appointment to be seen regarding the problem so she can be referred appropriately. Thanks.

## 2019-03-11 LAB — CULTURE, BLOOD (ROUTINE X 2)
Culture: NO GROWTH
Special Requests: ADEQUATE

## 2019-03-14 ENCOUNTER — Ambulatory Visit (INDEPENDENT_AMBULATORY_CARE_PROVIDER_SITE_OTHER): Payer: Medicare Other | Admitting: Family

## 2019-03-14 ENCOUNTER — Encounter: Payer: Self-pay | Admitting: Family

## 2019-03-14 ENCOUNTER — Other Ambulatory Visit: Payer: Self-pay

## 2019-03-14 VITALS — BP 100/60 | HR 86 | Temp 97.5°F | Ht 63.0 in | Wt 113.4 lb

## 2019-03-14 DIAGNOSIS — M79605 Pain in left leg: Secondary | ICD-10-CM

## 2019-03-14 DIAGNOSIS — R600 Localized edema: Secondary | ICD-10-CM

## 2019-03-14 DIAGNOSIS — M79604 Pain in right leg: Secondary | ICD-10-CM

## 2019-03-14 NOTE — Progress Notes (Signed)
Provider: Sanyia Dini FNP-C  [name], NP  Patient Care Team: [name], NP as PCP - General (Geriatric Medicine)  Extended Emergency Contact Information Primary Emergency Contact: [facility] Address: [place] Johnnette Litter of [place] Phone: [phone] Mobile Phone: [phone] Relation: Spouse  Code Status: Full Code  Goals of care: Advanced Directive information Advanced Directives 03/06/2019  Does Patient Have a Medical Advance Directive? No  Type of Advance Directive -  Copy of [place] in Chart? -  Would patient like information on creating a medical advance directive? Yes (ED - Information included in AVS)     Chief Complaint  Patient presents with   Acute Visit    Patient was advised by pt to get referral for feet pain     HPI:  Pt is a 73 y.o. female seen today for an acute visit for evaluation of Leg pain.she states right leg pain worst than the left. She states has had surgery on both feet due to hammer toes and Bunions.she was referred by her physical Therapy for evaluation of her leg pain.she states has pain on ankles that shots towards the knees.pain is described as sharp tingling sensation.she takes Tylenol for pain when pain worsen but does not like to take anything for it.she states has tried to wear leg braces and therapy but nothing works.she states does not want to take any Gabapentin for the tingling sensation.    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[place])    Lung cancer ([place]) 2011, 2014   Memory loss    Migraine    Opioid abuse ([place])    Osteoporosis    Presence of  neurostimulator    Recurrent upper respiratory infection (URI)    Restless legs    Sleep apnea    Tremor    Urge incontinence    Urticaria    long time ago, nothing recent   Past Surgical History:  Procedure Laterality Date   ABDOMINAL HYSTERECTOMY     CARPAL TUNNEL RELEASE     CATARACT EXTRACTION Bilateral 2019   CHOLECYSTECTOMY     COLONOSCOPY  2015   [facility]   CT LUNG SCREENING  2018   DG  BONE DENSITY ([place] HX)  2018   DIAGNOSTIC MAMMOGRAM  2019   GASTRIC BYPASS     LUNG CANCER SURGERY  02/2010   SPINAL CORD STIMULATOR IMPLANT      Allergies  Allergen Reactions   Butorphanol Tartrate Other (See Comments)    "like someone is brushing my brain with a brillo pad"   Tetracycline Nausea Only    Causes ringing in ears, dizziness, migraine, nausea   Corticosteroids Other (See Comments)    12/02/2016 interview by [facility]: Oral dosing causes migraines/nausea/tinnitus. Prev tolerated IV and intranasal admin w/o difficulty.   Cefixime Other (See Comments)    Headache    Ciprofloxacin Other (See Comments)    Headache, dizziness, ringing in the ears   Doxycycline Other (See Comments)    unknown   Gabapentin     Throat swells/closes  Isometheptene-Dichloral-Apap Other (See Comments)    unknown   Ketorolac     12/02/2016 interview by [facility]: Oral dosing prednisone/corticosteroids causes migraines/nausea/tinnitus. Prev tolerated IV and intranasal admin w/o difficulty.   Prednisone Other (See Comments)    Migraine, dizzy, ringing in ears-can take it IV 12/02/2016 interview by [name]: Oral prednisone dosing causes migraines/nausea/tinnitus. Prev tolerated IV and intranasal admin w/o difficulty.   Promethazine Other (See Comments)    causes severe abdominal pain when taken IV; however she can take PO or IM   Stadol [Butorphanol]     Cerebral pain   Erythromycin Base Diarrhea and Itching    Severe diarrhea   Propoxyphene Nausea Only    Outpatient Encounter  Medications as of 03/14/2019  Medication Sig   acetaminophen (TYLENOL) 650 MG CR tablet Take 1,300 mg by mouth every 8 (eight) hours as needed for pain or fever.    Apoaequorin (PREVAGEN PO) Take 1 capsule by mouth daily. 1 by mouth daily   Bacillus Coagulans-Inulin (ALIGN PREBIOTIC-PROBIOTIC PO) Take 1 capsule by mouth daily. 1 by mouth daily   Buprenorphine HCl-Naloxone HCl (SUBOXONE) 4-1 MG FILM Place 1 Film under the tongue daily. 1 daily   Calcium Citrate 200 MG TABS Take 1 tablet by mouth daily.    cycloSPORINE (RESTASIS) 0.05 % ophthalmic emulsion Place 1 drop into both eyes 2 (two) times daily.   dexlansoprazole (DEXILANT) 60 MG capsule Take 1 capsule (60 mg total) by mouth daily.   Erenumab-aooe (AIMOVIG [place]) Inject into the skin every 30 (thirty) days.    immune globulin, human, (GAMMAGARD S/D LESS IGA) 10 g injection Inject into the vein every 21 ( twenty-one) days.    levothyroxine (SYNTHROID) 75 MCG tablet Take 1 tablet (75 mcg total) by mouth daily before breakfast.   lubiprostone (AMITIZA) 24 MCG capsule Take 24 mcg by mouth daily as needed for constipation.    magnesium oxide (MAG-OX) 400 MG tablet Take 400 mg by mouth daily.    Melatonin 5 MG CAPS Take 5 mg by mouth at bedtime as needed (sleep).    Misc Natural Products (CRAN-B-OTC PO) Take 1 capsule by mouth daily. 1 daily    OLANZapine (ZYPREXA) 2.5 MG tablet Take 1/2 tablet at bedtime for 2-4 days, then may increase to 1 tablet at bedtime as tolerated   ondansetron (ZOFRAN-ODT) 8 MG disintegrating tablet Take 1 tablet (8 mg total) by mouth every 8 (eight) hours as needed for nausea or vomiting.   potassium chloride SA (K-DUR,KLOR-CON) 20 MEQ tablet Take 40 mEq by mouth daily.    pyridOXINE (VITAMIN B-6) 100 MG tablet Take 100 mg by mouth daily.   rOPINIRole (REQUIP) 0.5 MG tablet Take 0.5 mg by mouth 2 (two) times a day.    sucralfate (CARAFATE) 1 GM/10ML suspension Take 200 mg by mouth 3 (three) times daily  as needed (indigestion).    tiZANidine (ZANAFLEX) 2 MG tablet Take 2 mg by mouth 3 (three) times daily as needed for muscle spasms.    torsemide (DEMADEX) 20 MG tablet Take 20 mg by mouth daily.    vitamin B-12 (CYANOCOBALAMIN) 1000 MCG tablet Take 1,000 mcg by mouth daily.    No facility-administered encounter medications on file as of 03/14/2019.     Review of Systems  Respiratory: Negative for cough, chest tightness, shortness of breath and wheezing.   Cardiovascular: Negative for chest pain, palpitations and leg swelling.  Gastrointestinal: Negative for abdominal distention, abdominal pain, constipation, diarrhea, nausea and vomiting.  Musculoskeletal: Positive for  arthralgias and gait problem.       Worsening leg pain right > the left pain   Skin: Negative for color change, pallor and rash.  Neurological: Negative for dizziness, weakness, light-headedness and headaches.       Numbness of the legs.     Immunization History  Administered Date(s) Administered   Influenza, Seasonal, Injecte, Preservative Fre 11/14/2012   Influenza,inj,Quad PF,6+ Mos 05/24/2018   Influenza-Unspecified 04/17/2016, 06/09/2017   Pneumococcal Conjugate-13 07/29/2017   Pneumococcal Polysaccharide-23 07/29/2017   Tdap 05/30/2017   Zoster Recombinat (Shingrix) 06/17/2017   Pertinent  Health Maintenance Due  Topic Date Due   MAMMOGRAM  07/12/1998   COLONOSCOPY  07/12/1998   DEXA SCAN  07/12/2013   PNA vac Low Risk Adult (2 of 2 - PCV13) 07/29/2018   INFLUENZA VACCINE  03/18/2019   Fall Risk  03/14/2019 03/06/2019 01/27/2019  Falls in the past year? 0 0 0  Number falls in past yr: 0 0 0  Injury with Fall? 0 0 0    Vitals:   03/14/19 1009  BP: 100/60  Pulse: 86  Temp: (!) 97.5 F (36.4 C)  TempSrc: Oral  SpO2: 98%  Weight: 113 lb 6.4 oz (51.4 kg)  Height: 5\' 3"  (1.6 m)   Body mass index is 20.09 kg/m. Physical Exam Constitutional:      General: She is not in acute  distress.    Appearance: She is not ill-appearing.  HENT:     Mouth/Throat:     Mouth: Mucous membranes are moist.     Pharynx: Oropharynx is clear. No oropharyngeal exudate or posterior oropharyngeal erythema.  Eyes:     General: No scleral icterus.       Right eye: No discharge.        Left eye: No discharge.     Conjunctiva/sclera: Conjunctivae normal.     Pupils: Pupils are equal, round, and reactive to light.  Cardiovascular:     Rate and Rhythm: Normal rate. Rhythm irregular.     Pulses: Normal pulses.     Heart sounds: Normal heart sounds. No murmur. No friction rub. No gallop.   Pulmonary:     Effort: Pulmonary effort is normal. No respiratory distress.     Breath sounds: Normal breath sounds. No wheezing, rhonchi or rales.  Chest:     Chest wall: No tenderness.  Abdominal:     General: Bowel sounds are normal. There is no distension.     Palpations: Abdomen is soft. There is no mass.     Tenderness: There is no abdominal tenderness. There is no right CVA tenderness, left CVA tenderness, guarding or rebound.  Musculoskeletal:     Comments: Bilateral lower extremities edema right >left.unsteady gait ambulates with a cane.Patient guarding legs with palpation.seems to be tender all over to touch.   Skin:    General: Skin is warm and dry.     Coloration: Skin is not pale.     Findings: No bruising or erythema.  Neurological:     Mental Status: She is alert and oriented to person, place, and time.     Cranial Nerves: No cranial nerve deficit.     Sensory: No sensory deficit.     Motor: No weakness.     Coordination: Coordination normal.     Gait: Gait normal.  Psychiatric:        Mood and Affect: Mood normal.        Behavior: Behavior normal.  Thought Content: Thought content normal.        Judgment: Judgment normal.     Labs reviewed: Recent Labs    02/27/19 1745 03/06/19 1119 03/06/19 1624  NA 142 139 137  K 4.1 4.2 4.3  CL 102 99 101  CO2 29 31 26    GLUCOSE 83 92 93  BUN 30* 29* 34*  CREATININE 1.56* 1.40* 1.38*  CALCIUM 9.3 9.8 8.8*  MG 2.0  --   --    Recent Labs    01/27/19 1048 02/27/19 1745 03/06/19 1624  AST 21 23 22   ALT 15 17 15   ALKPHOS  --  60 51  BILITOT 0.4 0.3 0.3  PROT 7.3 6.5 6.7  ALBUMIN  --  3.5 3.4*   Recent Labs    02/27/19 1745 03/06/19 1119 03/06/19 1624  WBC 6.7 6.2 4.9  NEUTROABS 5.3 4,123 3.4  HGB 12.5 12.3 10.6*  HCT 39.0 37.4 33.6*  MCV 88.4 86.4 88.9  PLT 142* 199 139*   Lab Results  Component Value Date   TSH 4.58 (H) 01/27/2019   No results found for: HGBA1C Lab Results  Component Value Date   CHOL  01/27/2010    144        ATP III CLASSIFICATION:  <200     mg/dL   Desirable  200-239  mg/dL   Borderline High  >=240    mg/dL   High          HDL 58 01/27/2010   LDLCALC  01/27/2010    72        Total Cholesterol/HDL:CHD Risk Coronary Heart Disease Risk Table                     Men   Women  1/2 Average Risk   3.4   3.3  Average Risk       5.0   4.4  2 X Average Risk   9.6   7.1  3 X Average Risk  23.4   11.0        Use the calculated Patient Ratio above and the CHD Risk Table to determine the patient's CHD Risk.        ATP III CLASSIFICATION (LDL):  <100     mg/dL   Optimal  100-129  mg/dL   Near or Above                    Optimal  130-159  mg/dL   Borderline  160-189  mg/dL   High  >190     mg/dL   Very High   TRIG 68 01/27/2010   CHOLHDL 2.5 01/27/2010    Significant Diagnostic Results in last 30 days:  Ct Angio Chest Pe W And/or Wo Contrast  Result Date: 02/27/2019 CLINICAL DATA:  Severe diaphragmatic pain beginning at 3 p.m. today. PE suspected, high pretest probability. EXAM: CT ANGIOGRAPHY CHEST WITH CONTRAST TECHNIQUE: Multidetector CT imaging of the chest was performed using the standard protocol during bolus administration of intravenous contrast. Multiplanar CT image reconstructions and MIPs were obtained to evaluate the vascular anatomy. CONTRAST:   51mL OMNIPAQUE IOHEXOL 350 MG/ML SOLN COMPARISON:  Preoperative CTA chest 01/25/2010, chest radiograph 12/10/2018 FINDINGS: Cardiovascular: Satisfactory opacification of the pulmonary arteries to the segmental level. Postsurgical truncation of the bronchus intermedius. The no evidence of pulmonary embolism. Normal heart size. No pericardial effusion. Right chest port catheter tip terminates at the superior cavoatrial junction. Mediastinum/Nodes: Postsurgical changes in the right  hilum likely corresponding to prior right lower lobectomy. Rightward deviation of the mediastinal structures, expected postoperatively. Small hiatal hernia. Lungs/Pleura: Postsurgical changes from right lower lobectomy with scarring and pleural thickening in the right lung base as well as relative hyperexpansion of the remaining aerated portions of the right upper and middle lobes. No concerning nodules or masses are seen elsewhere within the lungs. No pneumothorax or left effusion. Upper Abdomen: Postsurgical changes at the diaphragmatic hiatus similar to prior study. Remainder of the upper abdomen is unremarkable. Musculoskeletal: Thoracic nerve stimulator is present. Post right thoracotomy changes including sclerotic foci in the right 6, seventh and eighth ribs laterally Review of the MIP images confirms the above findings. IMPRESSION: 1. No evidence of pulmonary embolism. 2. Postsurgical changes from right lower lobectomy with scarring and pleural thickening in the right lung base and postthoracotomy changes in the ribs. Such findings can be normal in the postsurgical setting though would be better assessed with comparison to prior postoperative imaging as the only comparison available at the time of interpretation is the preoperative exam. 3. Right chest port and thoracic nerve stimulator in place. Electronically Signed   By: MD Lovena Le   On: 02/27/2019 20:14   Dg Chest Portable 1 View  Result Date: 03/06/2019 CLINICAL DATA:   89-year-old with acute onset of generalized weakness that began about 2 o'clock this afternoon. Prior RIGHT LOWER lobectomy for lung cancer. EXAM: PORTABLE CHEST 1 VIEW COMPARISON:  02/27/2019 and earlier, including CTA chest 02/27/2019. FINDINGS: Cardiac silhouette normal in size, unchanged. Thoracic aorta mildly atherosclerotic, unchanged. Hilar and mediastinal contours otherwise unremarkable. LEFT subclavian Port-A-Cath tip projects at or near the cavoatrial junction, unchanged. DORSAL cord stimulating device again noted overlying the mid thoracic region. Post surgical changes related to RIGHT LOWER lobectomy with pleuroparenchymal scarring. Lungs otherwise clear. Pulmonary vascularity normal. No visible pleural effusions. IMPRESSION: No acute cardiopulmonary disease. Electronically Signed   By: Evangeline Dakin M.D.   On: 03/06/2019 16:52   Dg Chest Port 1 View  Result Date: 02/27/2019 CLINICAL DATA:  Shortness of breath EXAM: PORTABLE CHEST 1 VIEW COMPARISON:  CT from earlier in the same day. FINDINGS: Cardiac shadow is within normal limits. Left chest wall port is seen with catheter tip deep within the right atrium and stable. Spinal stimulator is again noted and stable. The lungs are well aerated. Postsurgical changes in the right base are seen. No acute infiltrate or effusion is noted. IMPRESSION: Postsurgical changes without acute abnormality. Electronically Signed   By: Inez Catalina M.D.   On: 02/27/2019 20:19    Assessment/Plan  1.Pain in both lower extremities Chronic pain.Has tried Tylenol,leg braces and Therapy without any relief.Recommended Gabapentin but patient declined. - Ambulatory referral to Orthopedic Surgery  2. Leg edema  Declined knee high ted hose.continue on Furosemide 20 mg tablet daily.on potassium supplement.Encouraged to keep legs elevated whenever seated.  Family/ staff Communication: Reviewed plan of care with patient.  Labs/tests ordered: None   Guillermo Nehring C Sandy Blouch,  NP

## 2019-03-15 ENCOUNTER — Encounter: Payer: Self-pay | Admitting: Family Medicine

## 2019-03-15 ENCOUNTER — Ambulatory Visit (INDEPENDENT_AMBULATORY_CARE_PROVIDER_SITE_OTHER): Payer: Medicare Other | Admitting: Family Medicine

## 2019-03-15 DIAGNOSIS — M79672 Pain in left foot: Secondary | ICD-10-CM

## 2019-03-15 DIAGNOSIS — R600 Localized edema: Secondary | ICD-10-CM | POA: Diagnosis not present

## 2019-03-15 DIAGNOSIS — M79671 Pain in right foot: Secondary | ICD-10-CM

## 2019-03-15 NOTE — Progress Notes (Signed)
Office Visit Note   Patient: Megan Brewer           Date of Birth: [birth year]           MRN: [id number] Visit Date: 03/15/2019 Requested by: Sandrea Hughs, NP [address] [place],  [place] [id number] PCP: [name], NP  Subjective: Chief Complaint  Patient presents with  . pain/pressure both legs/feet - chronic,worsening    HPI: She is here with bilateral foot and leg pain.  Longstanding problems since she was in her 47s.  Very complicated history.  She states that she had arachnoiditis from a spinal tap, and subsequently has had all kinds of health and mobility problems.  She had her ankle scoped on the right side roughly 30 years ago.  She has had multiple foot surgeries.  She states that both of her feet hurt quite a bit, and she feels that it is mainly due to edema in her legs.  When she first wakes up her legs are "skinny", but they soon swell despite wearing compression socks.  She has decreased kidney function which is being monitored by PCP.  She has torsemide to take on a daily basis.  This does not seem to make much difference in the swelling.               ROS: Denies fevers or chills.  Denies any chest pain or shortness of breath.   Objective: Vital Signs: There were no vitals taken for this visit.  Physical Exam:  General:  Alert and oriented, in no acute distress. Pulm:  Breathing unlabored. Psy:  Normal mood, congruent affect. Skin: No rash or skin breakdown. Legs: She has edema in the midfoot all the way to the knees.  She is diffusely tender to palpation around her feet and ankles.  2+ dorsalis pedis and posterior tibial pulses.   Imaging: None today.  Assessment & Plan: 1.  Chronic bilateral foot and leg pain, etiology uncertain but could be related to venous insufficiency. -We will try Dynaflex wraps followed by custom fitting of compression socks at medical supply store next week.  I will see her back as needed.     Procedures: No procedures  performed  No notes on file     PMFS History: Patient Active Problem List   Diagnosis Date Noted  . Weight loss 01/27/2019  . Acquired hypothyroidism 01/27/2019  . Esophagitis 01/27/2019  . Intractable migraine without status migrainosus 01/27/2019  . Osteoporosis 01/27/2019  . Chronic sinusitis 12/28/2018  . Food intolerance/GI symptoms 12/28/2018  . Acute maxillary sinusitis 12/28/2018  . Lower leg edema 12/20/2018  . ADENOCARCINOMA, RIGHT LUNG 01/31/2010  . 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 ARACHNOIDITIS 01/31/2010  . OBSTRUCTIVE SLEEP APNEA 01/31/2010  . Chronic rhinitis 01/31/2010   Past Medical History:  Diagnosis Date  . Arachnoiditis   . Bipolar disorder ([place])   . Chronic back pain   . Chronic pain   . Chronic post-thoracotomy pain   . CKD (chronic kidney disease)   . GERD (gastroesophageal reflux disease)   . Hypogammaglobulinemia ([place])   . Hypotension   . Hypothyroid   . 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 Lung cancer ([place]) 2011, 2014  . Memory loss   . Migraine   . Opioid abuse ([place])   . Osteoporosis   . Presence of neurostimulator   . Recurrent upper respiratory infection (URI)   . Restless legs   . Sleep apnea   .  Tremor   . Urge incontinence   . Urticaria    long time ago, nothing recent    Family History  Problem Relation Age of Onset  . Hypertension Mother   . GER disease Mother   . Dementia Mother   . Osteoporosis Mother   . Arthritis Mother   . Bipolar disorder Mother   . Parkinson's disease Father   . Congestive Heart Failure Father   . Pneumonia Father   . Arthritis Father   . Dementia Father   . Depression Father   . Asthma Sister   . Depression Sister   . Bipolar disorder Brother   . Asthma Daughter     Past Surgical History:  Procedure Laterality Date  . ABDOMINAL HYSTERECTOMY    . CARPAL TUNNEL RELEASE    . CATARACT EXTRACTION Bilateral 2019  . CHOLECYSTECTOMY    . COLONOSCOPY  2015   [facility]  . CT  LUNG SCREENING  2018  . DG  BONE DENSITY ([place] HX)  2018  . DIAGNOSTIC MAMMOGRAM  2019  . GASTRIC BYPASS    . LUNG CANCER SURGERY  02/2010  . SPINAL CORD STIMULATOR IMPLANT     Social History   Occupational History  . Not on file  Tobacco Use  . Smoking status: Never Smoker  . Smokeless tobacco: Never Used  Substance and Sexual Activity  . Alcohol use: Not Currently  . Drug use: Never  . Sexual activity: Not on file

## 2019-03-16 ENCOUNTER — Encounter: Payer: Medicare Other | Attending: Nurse Practitioner | Admitting: *Deleted

## 2019-03-16 ENCOUNTER — Emergency Department (HOSPITAL_BASED_OUTPATIENT_CLINIC_OR_DEPARTMENT_OTHER): Payer: Medicare Other

## 2019-03-16 ENCOUNTER — Emergency Department (HOSPITAL_BASED_OUTPATIENT_CLINIC_OR_DEPARTMENT_OTHER)
Admission: EM | Admit: 2019-03-16 | Discharge: 2019-03-17 | Disposition: A | Discharge status: Home / Self Care | Payer: Medicare Other | Source: Home / Self Care | Attending: Emergency Medicine | Admitting: Emergency Medicine

## 2019-03-16 ENCOUNTER — Encounter (HOSPITAL_BASED_OUTPATIENT_CLINIC_OR_DEPARTMENT_OTHER): Payer: Self-pay | Admitting: Emergency Medicine

## 2019-03-16 ENCOUNTER — Other Ambulatory Visit: Payer: Self-pay

## 2019-03-16 DIAGNOSIS — Z9884 Bariatric surgery status: Secondary | ICD-10-CM | POA: Insufficient documentation

## 2019-03-16 DIAGNOSIS — R0602 Shortness of breath: Secondary | ICD-10-CM | POA: Diagnosis not present

## 2019-03-16 DIAGNOSIS — R531 Weakness: Secondary | ICD-10-CM

## 2019-03-16 DIAGNOSIS — G4733 Obstructive sleep apnea (adult) (pediatric): Secondary | ICD-10-CM | POA: Insufficient documentation

## 2019-03-16 DIAGNOSIS — K219 Gastro-esophageal reflux disease without esophagitis: Secondary | ICD-10-CM | POA: Diagnosis not present

## 2019-03-16 DIAGNOSIS — R4182 Altered mental status, unspecified: Secondary | ICD-10-CM | POA: Diagnosis not present

## 2019-03-16 DIAGNOSIS — R5381 Other malaise: Secondary | ICD-10-CM | POA: Insufficient documentation

## 2019-03-16 DIAGNOSIS — R0789 Other chest pain: Secondary | ICD-10-CM | POA: Diagnosis not present

## 2019-03-16 DIAGNOSIS — Z79899 Other long term (current) drug therapy: Secondary | ICD-10-CM | POA: Insufficient documentation

## 2019-03-16 DIAGNOSIS — N189 Chronic kidney disease, unspecified: Secondary | ICD-10-CM | POA: Insufficient documentation

## 2019-03-16 DIAGNOSIS — Z85118 Personal history of other malignant neoplasm of bronchus and lung: Secondary | ICD-10-CM | POA: Insufficient documentation

## 2019-03-16 DIAGNOSIS — Z79890 Hormone replacement therapy (postmenopausal): Secondary | ICD-10-CM | POA: Diagnosis not present

## 2019-03-16 DIAGNOSIS — R634 Abnormal weight loss: Secondary | ICD-10-CM

## 2019-03-16 DIAGNOSIS — E039 Hypothyroidism, unspecified: Secondary | ICD-10-CM | POA: Diagnosis not present

## 2019-03-16 LAB — COMPREHENSIVE METABOLIC PANEL
ALT: 14 U/L (ref 0–44)
AST: 24 U/L (ref 15–41)
Albumin: 3.4 g/dL — ABNORMAL LOW (ref 3.5–5.0)
Alkaline Phosphatase: 53 U/L (ref 38–126)
Anion gap: 9 (ref 5–15)
BUN: 38 mg/dL — ABNORMAL HIGH (ref 8–23)
CO2: 26 mmol/L (ref 22–32)
Calcium: 8.7 mg/dL — ABNORMAL LOW (ref 8.9–10.3)
Chloride: 103 mmol/L (ref 98–111)
Creatinine, Ser: 1.13 mg/dL — ABNORMAL HIGH (ref 0.44–1.00)
GFR calc Af Amer: 57 mL/min — ABNORMAL LOW (ref >60)
GFR calc non Af Amer: 49 mL/min — ABNORMAL LOW (ref >60)
Glucose, Bld: 102 mg/dL — ABNORMAL HIGH (ref 70–99)
Potassium: 4.5 mmol/L (ref 3.5–5.1)
Sodium: 138 mmol/L (ref 135–145)
Total Bilirubin: 0.3 mg/dL (ref 0.3–1.2)
Total Protein: 6.2 g/dL — ABNORMAL LOW (ref 6.5–8.1)

## 2019-03-16 LAB — URINALYSIS, ROUTINE W REFLEX MICROSCOPIC
Bilirubin Urine: NEGATIVE
Glucose, UA: NEGATIVE mg/dL
Hgb urine dipstick: NEGATIVE
Ketones, ur: NEGATIVE mg/dL
Leukocytes,Ua: NEGATIVE
Nitrite: NEGATIVE
Protein, ur: NEGATIVE mg/dL
Specific Gravity, Urine: 1.01 (ref 1.005–1.030)
pH: 8 (ref 5.0–8.0)

## 2019-03-16 LAB — CBC
HCT: 32.3 % — ABNORMAL LOW (ref 36.0–46.0)
Hemoglobin: 9.9 g/dL — ABNORMAL LOW (ref 12.0–15.0)
MCH: 27.6 pg (ref 26.0–34.0)
MCHC: 30.7 g/dL (ref 30.0–36.0)
MCV: 90 fL (ref 80.0–100.0)
Platelets: 159 10*3/uL (ref 150–400)
RBC: 3.59 MIL/uL — ABNORMAL LOW (ref 3.87–5.11)
RDW: 13.3 % (ref 11.5–15.5)
WBC: 4.3 10*3/uL (ref 4.0–10.5)
nRBC: 0 % (ref 0.0–0.2)

## 2019-03-16 MED ORDER — SODIUM CHLORIDE 0.9 % IV BOLUS
1000.0000 mL | Freq: Once | INTRAVENOUS | Status: AC
  Administered 2019-03-16: 1000 mL via INTRAVENOUS

## 2019-03-16 MED ORDER — HEPARIN SOD (PORK) LOCK FLUSH 100 UNIT/ML IV SOLN
INTRAVENOUS | Status: AC
  Administered 2019-03-17
  Filled 2019-03-16: qty 5

## 2019-03-16 NOTE — Patient Instructions (Signed)
Plan: We discussed different ways to add more calories to your meals and snacks  Add powdered skim milk to eggs, oatmeal, etc to increase calories of foods you  Try Carnation Essentials found in the cereal aisle as a beverage substitute for milk or other beverages  Try Pulmocare 1.5  canned supplement which can be a beverage or meal replacement. It looks like it is available at [place] or [employer].com  Let me know if you have any other questions or concerns.

## 2019-03-16 NOTE — ED Provider Notes (Signed)
[facility] Provider Note   CSN: [id number] Arrival date & time: 03/16/19  2142    History   Chief Complaint Chief Complaint  Patient presents with  . Weakness    HPI Megan Brewer is a 73 y.o. female.     The patient was feeling well today until this evening when she suddenly developed weakness and a sense of lethargy.  She felt like she could not get up and was having difficulty getting more than a few words out at a time.  Patient does not have any other symptoms other than his extreme fatigue, which has now occurred 3 times in the past month.  Per her husband, the patient had a appointment with the dietitian today to discuss unintentional weight loss, and patient was fine at that time she was able to ambulate and speak in full sentences and was not altered mentally.  Husband states that this episode last occurred 10 days ago the prior to the ED and she felt better while in the emergency department and subsequently was discharged.  They have been trying to have this issue worked up outpatient and are waiting on a cardiology referral. Patient has a history of immunoglobulin deficiency and cancer.     Past Medical History:  Diagnosis Date  . Arachnoiditis   . Bipolar disorder ([place])   . Chronic back pain   . Chronic pain   . Chronic post-thoracotomy pain   . CKD (chronic kidney disease)   . GERD (gastroesophageal reflux disease)   . Hypogammaglobulinemia ([place])   . Hypotension   . Hypothyroid   . 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 Lung cancer ([place]) 2011, 2014  . Memory loss   . Migraine   . Opioid abuse ([place])   . Osteoporosis   . Presence of neurostimulator   . Recurrent upper respiratory infection (URI)   . Restless legs   . Sleep apnea   . Tremor   . Urge incontinence   . Urticaria    long time ago, nothing recent    Patient Active Problem List   Diagnosis Date Noted  . Weight loss 01/27/2019  . Acquired hypothyroidism  01/27/2019  . Esophagitis 01/27/2019  . Intractable migraine without status migrainosus 01/27/2019  . Osteoporosis 01/27/2019  . Chronic sinusitis 12/28/2018  . Food intolerance/GI symptoms 12/28/2018  . Acute maxillary sinusitis 12/28/2018  . Lower leg edema 12/20/2018  . ADENOCARCINOMA, RIGHT LUNG 01/31/2010  . 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 ARACHNOIDITIS 01/31/2010  . OBSTRUCTIVE SLEEP APNEA 01/31/2010  . Chronic rhinitis 01/31/2010    Past Surgical History:  Procedure Laterality Date  . ABDOMINAL HYSTERECTOMY    . CARPAL TUNNEL RELEASE    . CATARACT EXTRACTION Bilateral 2019  . CHOLECYSTECTOMY    . COLONOSCOPY  2015   [facility]  . CT LUNG SCREENING  2018  . DG  BONE DENSITY ([place] [facility])  2018  . DIAGNOSTIC MAMMOGRAM  2019  . GASTRIC BYPASS    . LUNG CANCER SURGERY  02/2010  . SPINAL CORD STIMULATOR IMPLANT       OB History   No obstetric history on file.      Home Medications    Prior to Admission medications   Medication Sig Start Date End Date Taking? Authorizing Provider  acetaminophen (TYLENOL) 650 MG CR tablet Take 1,300 mg by mouth every 8 (eight) hours as needed for pain or fever.     [provider]  Apoaequorin (PREVAGEN PO)  Take 1 capsule by mouth daily. 1 by mouth daily    [provider]  Bacillus Coagulans-Inulin (ALIGN PREBIOTIC-PROBIOTIC PO) Take 1 capsule by mouth daily. 1 by mouth daily    [provider]  Buprenorphine HCl-Naloxone HCl (SUBOXONE) 4-1 MG FILM Place 1 Film under the tongue daily. 1 daily    [provider]  Calcium Citrate 200 MG TABS Take 1 tablet by mouth daily.  12/02/16   [provider]  cycloSPORINE (RESTASIS) 0.05 % ophthalmic emulsion Place 1 drop into both eyes 2 (two) times daily.    [provider]  dexlansoprazole (DEXILANT) 60 MG capsule Take 1 capsule (60 mg total) by mouth daily. 01/25/19   Armbruster, Carlota Raspberry, MD  Erenumab-aooe (AIMOVIG [place]) Inject into the  skin every 30 (thirty) days.     [provider]  immune globulin, human, (GAMMAGARD S/D LESS IGA) 10 g injection Inject into the vein every 21 ( twenty-one) days.     [provider]  levothyroxine (SYNTHROID) 75 MCG tablet Take 1 tablet (75 mcg total) by mouth daily before breakfast. 01/27/19   [name], NP  lubiprostone (AMITIZA) 24 MCG capsule Take 24 mcg by mouth daily as needed for constipation.     [provider]  magnesium oxide (MAG-OX) 400 MG tablet Take 400 mg by mouth daily.     [provider]  Melatonin 5 MG CAPS Take 5 mg by mouth at bedtime as needed (sleep).     [provider]  Misc Natural Products (CRAN-B-OTC PO) Take 1 capsule by mouth daily. 1 daily     [provider]  OLANZapine (ZYPREXA) 2.5 MG tablet Take 1/2 tablet at bedtime for 2-4 days, then may increase to 1 tablet at bedtime as tolerated 02/22/19   Thayer Headings, PMHNP  ondansetron (ZOFRAN-ODT) 8 MG disintegrating tablet Take 1 tablet (8 mg total) by mouth every 8 (eight) hours as needed for nausea or vomiting. 01/05/19   Armbruster, Carlota Raspberry, MD  potassium chloride SA (K-DUR,KLOR-CON) 20 MEQ tablet Take 40 mEq by mouth daily.  05/23/13   [provider]  pyridOXINE (VITAMIN B-6) 100 MG tablet Take 100 mg by mouth daily.    [provider]  rOPINIRole (REQUIP) 0.5 MG tablet Take 0.5 mg by mouth 2 (two) times a day.     [provider]  sucralfate (CARAFATE) 1 GM/10ML suspension Take 200 mg by mouth 3 (three) times daily as needed (indigestion).     [provider]  tiZANidine (ZANAFLEX) 2 MG tablet Take 2 mg by mouth 3 (three) times daily as needed for muscle spasms.     [provider]  torsemide (DEMADEX) 20 MG tablet Take 20 mg by mouth daily.  11/04/12   [provider]  vitamin B-12 (CYANOCOBALAMIN) 1000 MCG tablet Take 1,000 mcg by mouth daily.     [provider]    Family History Family  History  Problem Relation Age of Onset  . Hypertension Mother   . GER disease Mother   . Dementia Mother   . Osteoporosis Mother   . Arthritis Mother   . Bipolar disorder Mother   . Parkinson's disease Father   . Congestive Heart Failure Father   . Pneumonia Father   . Arthritis Father   . Dementia Father   . Depression Father   . Asthma Sister   . Depression Sister   . Bipolar disorder Brother   . Asthma Daughter  Social History Social History   Tobacco Use  . Smoking status: Never Smoker  . Smokeless tobacco: Never Used  Substance Use Topics  . Alcohol use: Not Currently  . Drug use: Never     Allergies   Butorphanol tartrate, Tetracycline, Corticosteroids, Cefixime, Ciprofloxacin, Doxycycline, Gabapentin, Isometheptene-dichloral-apap, Ketorolac, Prednisone, Promethazine, Stadol [butorphanol], Erythromycin base, and Propoxyphene   Review of Systems Review of Systems  Constitutional: Positive for activity change, fatigue and unexpected weight change. Negative for fever.  HENT: Negative for sore throat.   Respiratory: Positive for chest tightness and shortness of breath. Negative for cough.   Cardiovascular: Negative for chest pain.  Gastrointestinal: Negative for abdominal pain, constipation, diarrhea, nausea and vomiting.  Musculoskeletal: Negative for neck pain.  Neurological: Negative for seizures, numbness and headaches.  Psychiatric/Behavioral: Positive for confusion.     Physical Exam Updated Vital Signs BP 124/67   Pulse 78   Temp 98.2 F (36.8 C) (Oral)   Resp 13   SpO2 100%   Physical Exam Constitutional:      General: She is not in acute distress.    Appearance: Normal appearance. She is ill-appearing.  HENT:     Nose: Nose normal. No rhinorrhea.     Mouth/Throat:     Mouth: Mucous membranes are moist.     Pharynx: Oropharynx is clear.  Eyes:     Extraocular Movements: Extraocular movements intact.     Conjunctiva/sclera: Conjunctivae  normal.     Pupils: Pupils are equal, round, and reactive to light.  Cardiovascular:     Rate and Rhythm: Normal rate and regular rhythm.     Pulses: Normal pulses.     Heart sounds: No murmur.  Pulmonary:     Effort: Pulmonary effort is normal.     Breath sounds: Normal breath sounds.  Abdominal:     General: Abdomen is flat. There is no distension.     Palpations: Abdomen is soft.     Tenderness: There is no abdominal tenderness.  Musculoskeletal:        General: No tenderness.  Skin:    General: Skin is warm and dry.  Neurological:     General: No focal deficit present.     Mental Status: She is alert and oriented to person, place, and time.     Cranial Nerves: No cranial nerve deficit.      ED Treatments / Results  Labs (all labs ordered are listed, but only abnormal results are displayed) Labs Reviewed  COMPREHENSIVE METABOLIC PANEL - Abnormal; Notable for the following components:      Result Value   Glucose, Bld 102 (*)    BUN 38 (*)    Creatinine, Ser 1.13 (*)    Calcium 8.7 (*)    Total Protein 6.2 (*)    Albumin 3.4 (*)    GFR calc non Af Amer 49 (*)    GFR calc Af Amer 57 (*)    All other components within normal limits  CBC - Abnormal; Notable for the following components:   RBC 3.59 (*)    Hemoglobin 9.9 (*)    HCT 32.3 (*)    All other components within normal limits  URINE CULTURE  URINALYSIS, ROUTINE W REFLEX MICROSCOPIC    EKG EKG Interpretation  Date/Time:  Thursday March 16 2019 22:34:49 EDT Ventricular Rate:  74 PR Interval:    QRS Duration: 116 QT Interval:  422 QTC Calculation: 469 R Axis:   31 Text Interpretation:  Sinus rhythm LVH with  secondary repolarization abnormality Anterior infarct, old Baseline wander in lead(s) V2 No significant change since last tracing Confirmed by Wandra Arthurs [phone]) on 03/16/2019 10:36:49 PM Also confirmed by Wandra Arthurs [phone]), editor Hattie Perch [phone])  on 03/17/2019 6:43:22 AM   Radiology  Ct Head Wo Contrast  Result Date: 03/16/2019 CLINICAL DATA:  Lethargy EXAM: CT HEAD WITHOUT CONTRAST TECHNIQUE: Contiguous axial images were obtained from the base of the skull through the vertex without intravenous contrast. COMPARISON:  05/29/2017 FINDINGS: Brain: No evidence of acute infarction, hemorrhage, hydrocephalus, extra-axial collection or mass lesion/mass effect. Mild subcortical white matter and periventricular small vessel ischemic changes. Vascular: Intracranial atherosclerosis. Skull: Normal. Negative for fracture or focal lesion. Sinuses/Orbits: Postsurgical changes related to prior fast. Partial opacification of the bilateral ethmoid sinuses and left sphenoid sinus. Chronic mucosal thickening/periosteal reaction involving the bilateral maxillary sinuses. Bilateral mastoid air cells are clear. Other: None. IMPRESSION: No evidence of acute intracranial abnormality. Mild small vessel ischemic changes. Electronically Signed   By: Julian Hy M.D.   On: 03/16/2019 23:36   Dg Chest Portable 1 View  Result Date: 03/16/2019 CLINICAL DATA:  Lethargy EXAM: PORTABLE CHEST 1 VIEW COMPARISON:  03/06/2019 FINDINGS: Lungs are clear.  No pleural effusion or pneumothorax. The heart is normal in size. Left chest port terminates at the cavoatrial junction. Thoracic spine stimulator. IMPRESSION: No evidence of acute cardiopulmonary disease. Electronically Signed   By: Julian Hy M.D.   On: 03/16/2019 23:35    Procedures Procedures (including critical care time)  Medications Ordered in ED Medications  sodium chloride 0.9 % bolus 1,000 mL (0 mLs Intravenous Stopped 03/16/19 2351)  heparin lock flush 100 UNIT/ML injection (  Given 03/17/19 0006)     Initial Impression / Assessment and Plan / ED Course  I have reviewed the triage vital signs and the nursing notes.  Pertinent labs & imaging results that were available during my care of the patient were reviewed by me and considered in my  medical decision making (see chart for details).        Patient is a 73 year old female who is now presenting for the third time in a month with sudden onset fatigue and shortness of breath.  Each time the patient has gone to the emergency department received treatment usually in the form of fluids and improved and then discharged home.  She is currently trying to have this worked up as an outpatient.  She has not yet been able to get a appointment with a cardiologist.  On arrival the patient's vitals were normal, she is afebrile.  Patient was initially only speaking in sentence fragments saying 1-2 words maximum before taking another breath.  Overall looked very lethargic.  Physical exam is otherwise benign.  Patient was given 1 L fluid bolus.  Lab work showed hemoglobin of 9.9, no leukocytosis, slightly elevated creatinine of 1.13 with a BUN of 38 suggesting prerenal AKI.  Urinalysis was completely normal.  EKG was consistent with previous EKGs.  Chest x-ray and CT head were normal with no acute abnormalities.  Patient improved dramatically and was back to baseline status post 1 L bolus.  This is now the third time this is happened to the patient in a month in between episodes she is otherwise normal.  Each time she resolves quickly and is not necessitating admission.  Work-up so far has included extensive imaging, EKG and blood work and was unrevealing.  Given the sudden onset and resolution this could be a  cardiogenic issue, but EKG was not suggestive of an arrhythmia.  This could be associated with a malignancy given her history of lung cancer, although chest x-ray, CT head, CTA of the chest have not revealed anything.  Patient has a history of gastric bypass and this could represent a nutritional deficiency or absorption issue.  This is more likely given the patient's improvement with fluids, although patient says she tries to hydrate as much as possible.  She does admit that her ability to tolerate  p.o. is quite limited due to the surgery and she gets "full "rather quickly.  Given her improvement which appears to be back to baseline, patient did not warrant inpatient admission and she was discharged home with her husband with advice to follow-up with her primary care provider for further work-up.  Final Clinical Impressions(s) / ED Diagnoses   Final diagnoses:  Weakness    ED Discharge Orders    None       Benay Pike, MD 03/17/19 1133    Drenda Freeze, MD 03/20/19 1052

## 2019-03-16 NOTE — ED Triage Notes (Signed)
Pt brought in by [facility] from home  Pt's spouse called and reported the pt is very lethargic  States this is the third time she has been like this in the past 2 weeks  Pt has hx of asymptomatic UTIs  Pt has generalized weakness   Pt has wraps on her lower extremities   Pt's initial blood pressure was 92/48, 60-P, 16-R, O2 sat 98 on room air

## 2019-03-16 NOTE — Discharge Instructions (Signed)
Your weakness does not appear to be from a infection.  All your lab work looks good.  There is nothing abnormal on your head CT or your chest x-ray.  It is difficult to say what is causing these episodes but everything we have done so far to work it up have been negative and you are feeling much better so we feel is safe for you to go home.  If these issues happen again, do not hesitate to come back to the emergency department.  Otherwise, continue to work this issue up with your primary care doctor.

## 2019-03-16 NOTE — ED Notes (Signed)
Patient transported to CT 

## 2019-03-18 LAB — URINE CULTURE: Culture: NO GROWTH

## 2019-03-20 ENCOUNTER — Telehealth: Payer: Self-pay | Admitting: Nurse Practitioner

## 2019-03-20 DIAGNOSIS — M79604 Pain in right leg: Secondary | ICD-10-CM

## 2019-03-20 DIAGNOSIS — R5381 Other malaise: Secondary | ICD-10-CM

## 2019-03-20 NOTE — Telephone Encounter (Signed)
Referral placed.

## 2019-03-20 NOTE — Telephone Encounter (Signed)
Please place referral if you would like for her to continue Physical Therapy.

## 2019-03-20 NOTE — Telephone Encounter (Signed)
Pt has been d/c from [facility]  as of last week. They have request her continue physical therapy for strength & increase stamina & mobilty.  Pt has requested to use [facility].  Thanks, Lattie Haw

## 2019-03-20 NOTE — Telephone Encounter (Signed)
Why was she discharged if they wanted her to continue? Okay for physical therapy referral

## 2019-03-21 ENCOUNTER — Other Ambulatory Visit: Payer: Self-pay

## 2019-03-21 ENCOUNTER — Ambulatory Visit (INDEPENDENT_AMBULATORY_CARE_PROVIDER_SITE_OTHER): Payer: Medicare Other

## 2019-03-21 DIAGNOSIS — R600 Localized edema: Secondary | ICD-10-CM

## 2019-03-21 NOTE — Progress Notes (Signed)
Removed bilateral Dynaflex wraps. Edema has gone down, left greater than right. Will go from here to [facility] for a bilateral thigh-high compression stockings (to the thigh per patient request).

## 2019-03-22 ENCOUNTER — Ambulatory Visit (INDEPENDENT_AMBULATORY_CARE_PROVIDER_SITE_OTHER): Payer: Medicare Other | Admitting: Cardiology

## 2019-03-22 ENCOUNTER — Other Ambulatory Visit: Payer: Self-pay | Admitting: Nurse Practitioner

## 2019-03-22 ENCOUNTER — Encounter: Payer: Self-pay | Admitting: Cardiology

## 2019-03-22 DIAGNOSIS — R0789 Other chest pain: Secondary | ICD-10-CM

## 2019-03-22 DIAGNOSIS — R079 Chest pain, unspecified: Secondary | ICD-10-CM

## 2019-03-22 NOTE — Progress Notes (Signed)
Cardiology Office Note:    Date:  03/22/2019   ID:  Megan Brewer, DOB Dec 31, 1945, MRN [id number]  PCP:  [name], NP  Cardiologist:  Jenean Lindau, MD   Referring MD: Sandrea Hughs, NP    ASSESSMENT:    1. Chest discomfort    PLAN:    In order of problems listed above:  1. Chest discomfort.  Patient's history is difficult to interpret.  Her symptoms are atypical for coronary etiology however she has had multiple visits to the emergency room with chest pain and I think it is fair to evaluate her now with a Lexiscan sestamibi.  I discussed this with the patient at extensive length and she understood.  She has had a test like this about 10 years ago.  Further recommendations will be made based on the findings of the test and the patient will be seen in follow-up on a as needed basis.   Medication Adjustments/Labs and Tests Ordered: Current medicines are reviewed at length with the patient today.  Concerns regarding medicines are outlined above.  No orders of the defined types were placed in this encounter.  No orders of the defined types were placed in this encounter.    History of Present Illness:    Megan Brewer is a 73 y.o. female who is being seen today for the evaluation of chest discomfort at the request of Ngetich, Dinah C, NP.  Patient is a pleasant 73 year old female.  She is extremely frail.  She has had multiple junk post resection of a part of the lung many years ago.  She is a frail lady and has had multiple emergency room visits for the same reason.  At the time of my evaluation, the patient is alert awake oriented and in no distress.  Past Medical History:  Diagnosis Date  . Arachnoiditis   . Bipolar disorder ([place])   . Chronic back pain   . Chronic pain   . Chronic post-thoracotomy pain   . CKD (chronic kidney disease)   . GERD (gastroesophageal reflux disease)   . Hypogammaglobulinemia ([place])   . Hypotension   . Hypothyroid   . Immunoglobulin  subclass deficiency (HCC)   . Lung cancer ([place]) 2011, 2014  . Memory loss   . Migraine   . Opioid abuse ([place])   . Osteoporosis   . Presence of neurostimulator   . Recurrent upper respiratory infection (URI)   . Restless legs   . Sleep apnea   . Tremor   . Urge incontinence   . Urticaria    long time ago, nothing recent    Past Surgical History:  Procedure Laterality Date  . ABDOMINAL HYSTERECTOMY    . CARPAL TUNNEL RELEASE    . CATARACT EXTRACTION Bilateral 2019  . CHOLECYSTECTOMY    . COLONOSCOPY  2015   [facility]  . CT LUNG SCREENING  2018  . DG  BONE DENSITY ([place] [facility])  2018  . DIAGNOSTIC MAMMOGRAM  2019  . GASTRIC BYPASS    . LUNG CANCER SURGERY  02/2010  . SPINAL CORD STIMULATOR IMPLANT      Current Medications: Current Meds  Medication Sig  . acetaminophen (TYLENOL) 650 MG CR tablet Take 1,300 mg by mouth every 8 (eight) hours as needed for pain or fever.   Marland Kitchen Apoaequorin (PREVAGEN PO) Take 1 capsule by mouth daily. 1 by mouth daily  . Bacillus Coagulans-Inulin (ALIGN PREBIOTIC-PROBIOTIC PO) Take 1 capsule by mouth daily. 1 by mouth daily  .  Buprenorphine HCl-Naloxone HCl (SUBOXONE) 4-1 MG FILM Place 1 Film under the tongue daily. 1 daily  . Calcium Citrate 200 MG TABS Take 1 tablet by mouth daily.   . cycloSPORINE (RESTASIS) 0.05 % ophthalmic emulsion Place 1 drop into both eyes 2 (two) times daily.  Marland Kitchen dexlansoprazole (DEXILANT) 60 MG capsule Take 1 capsule (60 mg total) by mouth daily.  Eduard Roux (AIMOVIG [place]) Inject into the skin every 30 (thirty) days.   Marland Kitchen immune globulin, human, (GAMMAGARD S/D LESS IGA) 10 g injection Inject into the vein every 21 ( twenty-one) days.   Marland Kitchen levothyroxine (SYNTHROID) 75 MCG tablet Take 1 tablet (75 mcg total) by mouth daily before breakfast.  . lubiprostone (AMITIZA) 24 MCG capsule Take 24 mcg by mouth daily as needed for constipation.   . magnesium oxide (MAG-OX) 400 MG tablet Take 400 mg by mouth daily.   . Melatonin 5 MG CAPS  Take 5 mg by mouth at bedtime as needed (sleep).   . Misc Natural Products (CRAN-B-OTC PO) Take 1 capsule by mouth daily. 1 daily   . OLANZapine (ZYPREXA) 2.5 MG tablet Take 1/2 tablet at bedtime for 2-4 days, then may increase to 1 tablet at bedtime as tolerated  . ondansetron (ZOFRAN-ODT) 8 MG disintegrating tablet Take 1 tablet (8 mg total) by mouth every 8 (eight) hours as needed for nausea or vomiting.  . potassium chloride SA (K-DUR,KLOR-CON) 20 MEQ tablet Take 40 mEq by mouth daily.   Marland Kitchen pyridOXINE (VITAMIN B-6) 100 MG tablet Take 100 mg by mouth daily.  Marland Kitchen rOPINIRole (REQUIP) 0.5 MG tablet Take 0.5 mg by mouth 2 (two) times a day.   . sucralfate (CARAFATE) 1 GM/10ML suspension Take 200 mg by mouth 3 (three) times daily as needed (indigestion).   Marland Kitchen tiZANidine (ZANAFLEX) 2 MG tablet Take 2 mg by mouth 3 (three) times daily as needed for muscle spasms.   Marland Kitchen torsemide (DEMADEX) 20 MG tablet Take 20 mg by mouth daily.   . vitamin B-12 (CYANOCOBALAMIN) 1000 MCG tablet Take 1,000 mcg by mouth daily.      Allergies:   Butorphanol tartrate, Tetracycline, Corticosteroids, Cefixime, Ciprofloxacin, Doxycycline, Gabapentin, Isometheptene-dichloral-apap, Ketorolac, Prednisone, Promethazine, Stadol [butorphanol], Erythromycin base, and Propoxyphene   Social History   Socioeconomic History  . Marital status: Married    Spouse name: Not on file  . Number of children: Not on file  . Years of education: Not on file  . Highest education level: Not on file  Occupational History  . Not on file  Social Needs  . Financial resource strain: Not on file  . Food insecurity    Worry: Not on file    Inability: Not on file  . Transportation needs    Medical: Not on file    Non-medical: Not on file  Tobacco Use  . Smoking status: Never Smoker  . Smokeless tobacco: Never Used  Substance and Sexual Activity  . Alcohol use: Not Currently  . Drug use: Never  . Sexual activity: Not on file  Lifestyle  .  Physical activity    Days per week: Not on file    Minutes per session: Not on file  . Stress: Not on file  Relationships  . Social Herbalist on phone: Not on file    Gets together: Not on file    Attends religious service: Not on file    Active member of club or organization: Not on file    Attends meetings of clubs  or organizations: Not on file    Relationship status: Not on file  Other Topics Concern  . Not on file  Social History Narrative   Diet: None      Caffeine: coffee, tea, sodas less than or 1 daily.      Married, if yes what year: Yes, 1972      Do you live in a house, apartment, assisted living, condo, trailer, ect: Two story house       Pets: None      Current/Past profession: Teach      Exercise: None         Living Will: No   DNR: No   POA/HPOA: No      Functional Status:   Do you have difficulty bathing or dressing yourself? yes   Do you have difficulty preparing food or eating? yes   Do you have difficulty managing your medications? yes   Do you have difficulty managing your finances?yes   Do you have difficulty affording your medications? no     Family History: The patient's family history includes Arthritis in her father and mother; Asthma in her daughter and sister; Bipolar disorder in her brother and mother; Congestive Heart Failure in her father; Dementia in her father and mother; Depression in her father and sister; GER disease in her mother; Hypertension in her mother; Osteoporosis in her mother; Parkinson's disease in her father; Pneumonia in her father.  ROS:   Please see the history of present illness.    All other systems reviewed and are negative.  EKGs/Labs/Other Studies Reviewed:    The following studies were reviewed today: EKG done in emergency room was unremarkable   Recent Labs: 01/27/2019: TSH 4.58 02/27/2019: Magnesium 2.0 03/16/2019: ALT 14; BUN 38; Creatinine, Ser 1.13; Hemoglobin 9.9; Platelets 159; Potassium  4.5; Sodium 138  Recent Lipid Panel    Component Value Date/Time   CHOL  01/27/2010 0550    144        ATP III CLASSIFICATION:  <200     mg/dL   Desirable  200-239  mg/dL   Borderline High  >=240    mg/dL   High          TRIG 68 01/27/2010 0550   HDL 58 01/27/2010 0550   CHOLHDL 2.5 01/27/2010 0550   VLDL 14 01/27/2010 0550   LDLCALC  01/27/2010 0550    72        Total Cholesterol/HDL:CHD Risk Coronary Heart Disease Risk Table                     Men   Women  1/2 Average Risk   3.4   3.3  Average Risk       5.0   4.4  2 X Average Risk   9.6   7.1  3 X Average Risk  23.4   11.0        Use the calculated Patient Ratio above and the CHD Risk Table to determine the patient's CHD Risk.        ATP III CLASSIFICATION (LDL):  <100     mg/dL   Optimal  100-129  mg/dL   Near or Above                    Optimal  130-159  mg/dL   Borderline  160-189  mg/dL   High  >190     mg/dL   Very High    Physical Exam:  VS:  BP 104/62   Pulse 86   Ht 5\' 3"  (1.6 m)   Wt 113 lb 1.9 oz (51.3 kg)   SpO2 99%   BMI 20.04 kg/m     Wt Readings from Last 3 Encounters:  03/22/19 113 lb 1.9 oz (51.3 kg)  03/14/19 113 lb 6.4 oz (51.4 kg)  03/06/19 112 lb 12.8 oz (51.2 kg)     GEN: Patient is in no acute distress HEENT: Normal NECK: No JVD; No carotid bruits LYMPHATICS: No lymphadenopathy CARDIAC: S1 S2 regular, 2/6 systolic murmur at the apex. RESPIRATORY:  Clear to auscultation without rales, wheezing or rhonchi  ABDOMEN: Soft, non-tender, non-distended MUSCULOSKELETAL:  No edema; No deformity  SKIN: Warm and dry NEUROLOGIC:  Alert and oriented x 3 PSYCHIATRIC:  Normal affect    Signed, Jenean Lindau, MD  03/22/2019 2:29 PM    [place]

## 2019-03-22 NOTE — Progress Notes (Signed)
Medical Nutrition Therapy:  Appt start time: 4276 end time:  1700.  Assessment:  Primary concerns today: she is here today for recommendations on how to increase her nutrition intake due to excessive weight loss. She is here with her husband who appeared supportive. He cooks her meals and participated in the visit in a positive way. She states history of esophagitis, GERD and acid reflux. She also states she had gastric bypass surgery over 10 years ago so she has limited stomach space and has to spread out liquids and solid foods over different times.   Preferred Learning Style:   Auditory  Visual  Hands on   Learning Readiness:   Contemplating  MEDICATIONS: see list   DIETARY INTAKE:  Usual eating pattern includes 3 meals and 2-3 snacks per day.  Everyday foods include easy to chew foods.  Avoided foods include spicy or rich foods.    24-hr recall:  B ( AM): egg, cheese, skim milk  Snk ( AM): yogurt  L ( PM): smoothie, 1/2 sandwich Or Tropical Smoothie Snk ( PM): varies D ( PM): canned beans with salsa or other mixed meal Snk ( PM):  Beverages: milk, water, hot tea  Usual physical activity: limited, she is in wheel chair today  Estimated energy needs: 1600 calories 180 g carbohydrates 120 g protein 44 g fat  Progress Towards Goal(s):  Modified goal(s).   Nutritional Diagnosis:  NI-1.4 Inadequate energy intake As related to weight .  As evidenced by decrease in weight over the past several months.    Intervention:  Nutrition counseling for weight stabilization initiated. Discussed Carb Counting by food group as method of portion control, and reading food labels. Also discussed some supplement ideas to increase calories without increasing volume of food eaten.  Teaching Method Utilized: Visual,  Auditory and Hands on  Plan: We discussed different ways to add more calories to your meals and snacks  Add powdered skim milk to eggs, oatmeal, etc to increase calories of  foods you  Try Carnation Essentials found in the cereal aisle as a beverage substitute for milk or other beverages  Try Pulmocare 1.5  canned supplement which can be a beverage or meal replacement. It looks like it is available at [place] or [employer].com  Let me know if you have any other questions or concerns.   Demonstrated degree of understanding via:  Teach Back   Monitoring/Evaluation:  Dietary intake, exercise, use of supplements for additional calories, and body weight prn.

## 2019-03-22 NOTE — Addendum Note (Signed)
Addended by: Beckey Rutter on: 03/22/2019 02:45 PM   Modules accepted: Orders

## 2019-03-22 NOTE — Patient Instructions (Signed)
Medication Instructions:  Your physician recommends that you continue on your current medications as directed. Please refer to the Current Medication list given to you today.  If you need a refill on your cardiac medications before your next appointment, please call your pharmacy.   Lab work: NONE If you have labs (blood work) drawn today and your tests are completely normal, you will receive your results only by: Marland Kitchen MyChart Message (if you have MyChart) OR . A paper copy in the mail If you have any lab test that is abnormal or we need to change your treatment, we will call you to review the results.  Testing/Procedures: Your physician has requested that you have a lexiscan myoview. For further information please visit [email or web address]. Please follow instruction sheet, as given.  Follow-Up: At [facility], you and your health needs are our priority.  As part of our continuing mission to provide you with exceptional heart care, we have created designated Provider Care Teams.  These Care Teams include your primary Cardiologist (physician) and Advanced Practice Providers (APPs -  Physician Assistants and Nurse Practitioners) who all work together to provide you with the care you need, when you need it. You will need a follow up appointment as needed in the future.   Any Other Special Instructions Will Be Listed Below  Regadenoson injection What is this medicine? REGADENOSON is used to test the heart for coronary artery disease. It is used in patients who can not exercise for their stress test. This medicine may be used for other purposes; ask your health care provider or pharmacist if you have questions. COMMON BRAND NAME(S): Lexiscan What should I tell my health care provider before I take this medicine? They need to know if you have any of these conditions:  heart problems  lung or breathing disease, like asthma or COPD  an unusual or allergic reaction to regadenoson, other  medicines, foods, dyes, or preservatives  pregnant or trying to get pregnant  breast-feeding How should I use this medicine? This medicine is for injection into a vein. It is given by a health care professional in a hospital or clinic setting. Talk to your pediatrician regarding the use of this medicine in children. Special care may be needed. Overdosage: If you think you have taken too much of this medicine contact a poison control center or emergency room at once. NOTE: This medicine is only for you. Do not share this medicine with others. What if I miss a dose? This does not apply. What may interact with this medicine?  caffeine  dipyridamole  guarana  theophylline This list may not describe all possible interactions. Give your health care provider a list of all the medicines, herbs, non-prescription drugs, or dietary supplements you use. Also tell them if you smoke, drink alcohol, or use illegal drugs. Some items may interact with your medicine. What should I watch for while using this medicine? Your condition will be monitored carefully while you are receiving this medicine. Do not take medicines, foods, or drinks with caffeine (like coffee, tea, or colas) for at least 12 hours before your test. If you do not know if something contains caffeine, ask your health care professional. What side effects may I notice from receiving this medicine? Side effects that you should report to your doctor or health care professional as soon as possible:  allergic reactions like skin rash, itching or hives, swelling of the face, lips, or tongue  breathing problems  chest pain, tightness or palpitations  severe headache Side effects that usually do not require medical attention (report to your doctor or health care professional if they continue or are bothersome):  flushing  headache  irritation or pain at site where injected  nausea, vomiting This list may not describe all possible  side effects. Call your doctor for medical advice about side effects. You may report side effects to FDA at 1-800-FDA-1088. Where should I keep my medicine? This drug is given in a hospital or clinic and will not be stored at home. NOTE: This sheet is a summary. It may not cover all possible information. If you have questions about this medicine, talk to your doctor, pharmacist, or health care provider.  2020 Elsevier/Gold Standard (2008-04-02 15:08:13)  Cardiac Nuclear Scan A cardiac nuclear scan is a test that is done to check the flow of blood to your heart. It is done when you are resting and when you are exercising. The test looks for problems such as:  Not enough blood reaching a portion of the heart.  The heart muscle not working as it should. You may need this test if:  You have heart disease.  You have had lab results that are not normal.  You have had heart surgery or a balloon procedure to open up blocked arteries (angioplasty).  You have chest pain.  You have shortness of breath. In this test, a special dye (tracer) is put into your bloodstream. The tracer will travel to your heart. A camera will then take pictures of your heart to see how the tracer moves through your heart. This test is usually done at a hospital and takes 2-4 hours. Tell a doctor about:  Any allergies you have.  All medicines you are taking, including vitamins, herbs, eye drops, creams, and over-the-counter medicines.  Any problems you or family members have had with anesthetic medicines.  Any blood disorders you have.  Any surgeries you have had.  Any medical conditions you have.  Whether you are pregnant or may be pregnant. What are the risks? Generally, this is a safe test. However, problems may occur, such as:  Serious chest pain and heart attack. This is only a risk if the stress portion of the test is done.  Rapid heartbeat.  A feeling of warmth in your chest. This feeling usually  does not last long.  Allergic reaction to the tracer. What happens before the test?  Ask your doctor about changing or stopping your normal medicines. This is important.  Follow instructions from your doctor about what you cannot eat or drink.  Remove your jewelry on the day of the test. What happens during the test?  An IV tube will be inserted into one of your veins.  Your doctor will give you a small amount of tracer through the IV tube.  You will wait for 20-40 minutes while the tracer moves through your bloodstream.  Your heart will be monitored with an electrocardiogram (ECG).  You will lie down on an exam table.  Pictures of your heart will be taken for about 15-20 minutes.  You may also have a stress test. For this test, one of these things may be done: ? You will be asked to exercise on a treadmill or a stationary bike. ? You will be given medicines that will make your heart work harder. This is done if you are unable to exercise.  When blood flow to your heart has peaked, a tracer will again be given through the IV tube.  After 20-40 minutes, you will get back on the exam table. More pictures will be taken of your heart.  Depending on the tracer that is used, more pictures may need to be taken 3-4 hours later.  Your IV tube will be removed when the test is over. The test may vary among doctors and hospitals. What happens after the test?  Ask your doctor: ? Whether you can return to your normal schedule, including diet, activities, and medicines. ? Whether you should drink more fluids. This will help to remove the tracer from your body. Drink enough fluid to keep your pee (urine) pale yellow.  Ask your doctor, or the department that is doing the test: ? When will my results be ready? ? How will I get my results? Summary  A cardiac nuclear scan is a test that is done to check the flow of blood to your heart.  Tell your doctor whether you are pregnant or may be  pregnant.  Before the test, ask your doctor about changing or stopping your normal medicines. This is important.  Ask your doctor whether you can return to your normal activities. You may be asked to drink more fluids. This information is not intended to replace advice given to you by your health care provider. Make sure you discuss any questions you have with your health care provider. Document Released: 01/17/2018 Document Revised: 11/23/2018 Document Reviewed: 01/17/2018 Elsevier Patient Education  2020 [employer].

## 2019-03-23 ENCOUNTER — Encounter: Payer: Self-pay | Admitting: Psychiatry

## 2019-03-23 ENCOUNTER — Other Ambulatory Visit: Payer: Self-pay

## 2019-03-23 ENCOUNTER — Ambulatory Visit (INDEPENDENT_AMBULATORY_CARE_PROVIDER_SITE_OTHER): Payer: Medicare Other | Admitting: Psychiatry

## 2019-03-23 DIAGNOSIS — F3162 Bipolar disorder, current episode mixed, moderate: Secondary | ICD-10-CM | POA: Diagnosis not present

## 2019-03-23 DIAGNOSIS — F99 Mental disorder, not otherwise specified: Secondary | ICD-10-CM

## 2019-03-23 DIAGNOSIS — F419 Anxiety disorder, unspecified: Secondary | ICD-10-CM

## 2019-03-23 DIAGNOSIS — F5105 Insomnia due to other mental disorder: Secondary | ICD-10-CM

## 2019-03-23 MED ORDER — OLANZAPINE 2.5 MG PO TABS: 2.5000 mg | ORAL_TABLET | Freq: Every day | ORAL | 3 refills | Status: DC

## 2019-03-23 NOTE — Progress Notes (Signed)
Megan Brewer [id number] Apr 13, 1946 73 y.o.  Subjective:   Patient ID:  Megan Brewer is a 73 y.o. (DOB [birth year]) female.  Chief Complaint:  Chief Complaint  Patient presents with  . Follow-up    Depression, Anixety, insomnia, wt loss    HPI Megan Brewer presents to the office today for follow-up of mood, anxiety, and insomnia. She reports that "psychologically much better." She reports that her sleep has improved. Reports that she is sleeping with less interruption. She reports that she feels more rested upon awakening. Reports sleeping at least 4 hours a night. She reports that her appetite has improved and has gained weight (about 3-5 lbs). Reports that she has some difficulty with concentration and will forget what she wanted to say if she does not say it immediately. Reports that she has had some anxiety about politics and after seeing some images on TV that reminded her of past civil injustices. She reports periods of increased anxiety "without warning" and denies any acute anxiety since starting Olanzapine. She reports a significant improvement in her mood. She reports that she has "laughed and even told a few jokes." Improved energy and motivation. Reports that she has been doing some things at home that she had previously not been able to do. Reports cooking for the first time in decades. She reports that she tries to control her irritability.Denies SI. Reports occ vague fleeting suicidal thoughts.   Has been coloring pictures to give to others that have health issues or other disadvantages.  Reports that husband has been going through significant stress. Husband has also been having severe pain.   Past Psychiatric Medication Trials: Sertraline- Was effective for about 25 years and no longer seems to be as effective.  Prozac Paxil  Cymbalta- may have caused psychosis. Depakote Gabapentin Lithium- Edema. Minimally effective Seroquel Risperidone Zyprexa Melatonin Ativan- Reports  taking until 2 months ago.  Valium- "great for my depression." Xanax Trazodone  May have taken Zyprexa, Abilify, Remeron  Review of Systems:  Review of Systems  Cardiovascular:       Has stress test scheduled next week.   Gastrointestinal:       Reports that she is seeing a nutritionist and is working on being able to tolerate regular hydration and food intake.   Musculoskeletal: Positive for gait problem.       Ambulates with cane    Medications: I have reviewed the patient's current medications.  Current Outpatient Medications  Medication Sig Dispense Refill  . acetaminophen (TYLENOL) 650 MG CR tablet Take 1,300 mg by mouth every 8 (eight) hours as needed for pain or fever.     Marland Kitchen Apoaequorin (PREVAGEN PO) Take 1 capsule by mouth daily. 1 by mouth daily    . Bacillus Coagulans-Inulin (ALIGN PREBIOTIC-PROBIOTIC PO) Take 1 capsule by mouth daily. 1 by mouth daily    . Buprenorphine HCl-Naloxone HCl (SUBOXONE) 4-1 MG FILM Place 1 Film under the tongue daily. 1 daily    . Calcium Citrate 200 MG TABS Take 1 tablet by mouth daily.     . cycloSPORINE (RESTASIS) 0.05 % ophthalmic emulsion Place 1 drop into both eyes 2 (two) times daily.    Marland Kitchen dexlansoprazole (DEXILANT) 60 MG capsule Take 1 capsule (60 mg total) by mouth daily. 30 capsule 3  . Erenumab-aooe (AIMOVIG [place]) Inject into the skin every 30 (thirty) days.     Marland Kitchen immune globulin, human, (GAMMAGARD S/D LESS IGA) 10 g injection Inject into the vein every 21 ( twenty-one) days.     Marland Kitchen  levothyroxine (SYNTHROID) 75 MCG tablet Take 1 tablet (75 mcg total) by mouth daily before breakfast. 90 tablet 1  . lubiprostone (AMITIZA) 24 MCG capsule Take 24 mcg by mouth daily as needed for constipation.     . magnesium oxide (MAG-OX) 400 MG tablet Take 400 mg by mouth daily.     . Melatonin 5 MG CAPS Take 5 mg by mouth at bedtime as needed (sleep).     . Misc Natural Products (CRAN-B-OTC PO) Take 1 capsule by mouth daily. 1 daily     . OLANZapine  (ZYPREXA) 2.5 MG tablet Take 1 tablet (2.5 mg total) by mouth at bedtime. 30 tablet 3  . potassium chloride SA (K-DUR) 20 MEQ tablet TAKE 2 TABLETS BY MOUTH DAILY (MAY DISSOLVE IN WATER) 60 tablet 0  . pyridOXINE (VITAMIN B-6) 100 MG tablet Take 100 mg by mouth daily.    Marland Kitchen rOPINIRole (REQUIP) 0.5 MG tablet Take 0.5 mg by mouth 2 (two) times a day.     . sucralfate (CARAFATE) 1 GM/10ML suspension Take 200 mg by mouth 3 (three) times daily as needed (indigestion).     Marland Kitchen tiZANidine (ZANAFLEX) 2 MG tablet Take 2 mg by mouth 3 (three) times daily as needed for muscle spasms.     Marland Kitchen torsemide (DEMADEX) 20 MG tablet Take 20 mg by mouth daily.     . vitamin B-12 (CYANOCOBALAMIN) 1000 MCG tablet Take 1,000 mcg by mouth daily.     . ondansetron (ZOFRAN-ODT) 8 MG disintegrating tablet Take 1 tablet (8 mg total) by mouth every 8 (eight) hours as needed for nausea or vomiting. (Patient not taking: Reported on 03/23/2019) 90 tablet 1   No current facility-administered medications for this visit.     Medication Side Effects: Other: Increased appetite  Allergies:  Allergies  Allergen Reactions  . Butorphanol Tartrate Other (See Comments)    "like someone is brushing my brain with a brillo pad"  . Tetracycline Nausea Only    Causes ringing in ears, dizziness, migraine, nausea  . Corticosteroids Other (See Comments)    12/02/2016 interview by Melissa Montane: Oral dosing causes migraines/nausea/tinnitus. Prev tolerated IV and intranasal admin w/o difficulty.  . Cefixime Other (See Comments)    Headache   . Ciprofloxacin Other (See Comments)    Headache, dizziness, ringing in the ears  . Doxycycline Other (See Comments)    unknown  . Gabapentin     Throat swells/closes  . Isometheptene-Dichloral-Apap Other (See Comments)    unknown  . Ketorolac     12/02/2016 interview by WSF: Oral dosing prednisone/corticosteroids causes migraines/nausea/tinnitus. Prev tolerated IV and intranasal admin w/o difficulty.  .  Prednisone Other (See Comments)    Migraine, dizzy, ringing in ears-can take it IV 12/02/2016 interview by [name]: Oral prednisone dosing causes migraines/nausea/tinnitus. Prev tolerated IV and intranasal admin w/o difficulty.  . Promethazine Other (See Comments)    causes severe abdominal pain when taken IV; however she can take PO or IM  . Stadol [Butorphanol]     Cerebral pain  . Erythromycin Base Diarrhea and Itching    Severe diarrhea  . Propoxyphene Nausea Only    Past Medical History:  Diagnosis Date  . Arachnoiditis   . Bipolar disorder ([place])   . Chronic back pain   . Chronic pain   . Chronic post-thoracotomy pain   . CKD (chronic kidney disease)   . GERD (gastroesophageal reflux disease)   . Hypogammaglobulinemia ([place])   . Hypotension   . Hypothyroid   .  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 Lung cancer ([place]) 2011, 2014  . Memory loss   . Migraine   . Opioid abuse ([place])   . Osteoporosis   . Presence of neurostimulator   . Recurrent upper respiratory infection (URI)   . Restless legs   . Sleep apnea   . Tremor   . Urge incontinence   . Urticaria    long time ago, nothing recent    Family History  Problem Relation Age of Onset  . Hypertension Mother   . GER disease Mother   . Dementia Mother   . Osteoporosis Mother   . Arthritis Mother   . Bipolar disorder Mother   . Parkinson's disease Father   . Congestive Heart Failure Father   . Pneumonia Father   . Arthritis Father   . Dementia Father   . Depression Father   . Asthma Sister   . Depression Sister   . Bipolar disorder Brother   . Asthma Daughter     Social History   Socioeconomic History  . Marital status: Married    Spouse name: Not on file  . Number of children: Not on file  . Years of education: Not on file  . Highest education level: Not on file  Occupational History  . Not on file  Social Needs  . Financial resource strain: Not on file  . Food insecurity    Worry: Not on file     Inability: Not on file  . Transportation needs    Medical: Not on file    Non-medical: Not on file  Tobacco Use  . Smoking status: Never Smoker  . Smokeless tobacco: Never Used  Substance and Sexual Activity  . Alcohol use: Not Currently  . Drug use: Never  . Sexual activity: Not on file  Lifestyle  . Physical activity    Days per week: Not on file    Minutes per session: Not on file  . Stress: Not on file  Relationships  . Social Herbalist on phone: Not on file    Gets together: Not on file    Attends religious service: Not on file    Active member of club or organization: Not on file    Attends meetings of clubs or organizations: Not on file    Relationship status: Not on file  . Intimate partner violence    Fear of current or ex partner: Not on file    Emotionally abused: Not on file    Physically abused: Not on file    Forced sexual activity: Not on file  Other Topics Concern  . Not on file  Social History Narrative   Diet: None      Caffeine: coffee, tea, sodas less than or 1 daily.      Married, if yes what year: Yes, 1972      Do you live in a house, apartment, assisted living, condo, trailer, ect: Two story house       Pets: None      Current/Past profession: Teach      Exercise: None         Living Will: No   DNR: No   POA/HPOA: No      Functional Status:   Do you have difficulty bathing or dressing yourself? yes   Do you have difficulty preparing food or eating? yes   Do you have difficulty managing your medications? yes   Do you have difficulty managing your  finances?yes   Do you have difficulty affording your medications? no    Past Medical History, Surgical history, Social history, and Family history were reviewed and updated as appropriate.   Please see review of systems for further details on the patient's review from today.   Objective:   Physical Exam:  There were no vitals taken for this visit.  Physical  Exam Constitutional:      General: She is not in acute distress.    Appearance: She is well-developed.  Musculoskeletal:        General: No deformity.  Neurological:     Mental Status: She is alert and oriented to person, place, and time.     Coordination: Coordination normal.  Psychiatric:        Attention and Perception: Attention and perception normal. She does not perceive auditory or visual hallucinations.        Mood and Affect: Mood normal. Mood is not anxious or depressed. Affect is not labile, blunt, angry or inappropriate.        Speech: Speech normal.        Behavior: Behavior normal.        Thought Content: Thought content normal. Thought content does not include homicidal or suicidal ideation. Thought content does not include homicidal or suicidal plan.        Cognition and Memory: Cognition and memory normal.        Judgment: Judgment normal.     Comments: Insight intact. No delusions.      Lab Review:     Component Value Date/Time   NA 138 03/16/2019 2222   K 4.5 03/16/2019 2222   CL 103 03/16/2019 2222   CO2 26 03/16/2019 2222   GLUCOSE 102 (H) 03/16/2019 2222   BUN 38 (H) 03/16/2019 2222   CREATININE 1.13 (H) 03/16/2019 2222   CREATININE 1.40 (H) 03/06/2019 1119   CALCIUM 8.7 (L) 03/16/2019 2222   PROT 6.2 (L) 03/16/2019 2222   ALBUMIN 3.4 (L) 03/16/2019 2222   AST 24 03/16/2019 2222   ALT 14 03/16/2019 2222   ALKPHOS 53 03/16/2019 2222   BILITOT 0.3 03/16/2019 2222   GFRNONAA 49 (L) 03/16/2019 2222   GFRNONAA 38 (L) 03/06/2019 1119   GFRAA 57 (L) 03/16/2019 2222   GFRAA 44 (L) 03/06/2019 1119       Component Value Date/Time   WBC 4.3 03/16/2019 2222   RBC 3.59 (L) 03/16/2019 2222   HGB 9.9 (L) 03/16/2019 2222   HCT 32.3 (L) 03/16/2019 2222   PLT 159 03/16/2019 2222   MCV 90.0 03/16/2019 2222   MCH 27.6 03/16/2019 2222   MCHC 30.7 03/16/2019 2222   RDW 13.3 03/16/2019 2222   LYMPHSABS 1.0 03/06/2019 1624   MONOABS 0.4 03/06/2019 1624    EOSABS 0.0 03/06/2019 1624   BASOSABS 0.0 03/06/2019 1624    No results found for: POCLITH, LITHIUM   No results found for: PHENYTOIN, PHENOBARB, VALPROATE, CBMZ   .res Assessment: Plan:   Will continue Zyprexa 2.5 mg po QHS for mood, insomnia, and anxiety.  Pt to f/u in 3 months or sooner if clinically indicated.  Patient advised to contact office with any questions, adverse effects, or acute worsening in signs and symptoms.  Lazara was seen today for follow-up.  Diagnoses and all orders for this visit:  Bipolar disorder, current episode mixed, moderate (HCC) -     OLANZapine (ZYPREXA) 2.5 MG tablet; Take 1 tablet (2.5 mg total) by mouth at bedtime.  Anxiety disorder,  unspecified type -     OLANZapine (ZYPREXA) 2.5 MG tablet; Take 1 tablet (2.5 mg total) by mouth at bedtime.  Insomnia due to other mental disorder -     OLANZapine (ZYPREXA) 2.5 MG tablet; Take 1 tablet (2.5 mg total) by mouth at bedtime.     Please see After Visit Summary for patient specific instructions.  Future Appointments  Date Time Provider [place]  03/29/2019  7:30 AM [facility] NL NUC MED [facility]I [facility]  03/30/2019  3:00 PM GI-BCG DX DEXA 1 GI-BCGDG GI-BREAST CE  05/01/2019  1:00 PM [name], NP PSC-PSC None  05/04/2019 11:00 AM Bobbitt, Sedalia Muta, MD AAC-HP None  06/23/2019 11:30 AM Thayer Headings, PMHNP CP-CP None    No orders of the defined types were placed in this encounter.   -------------------------------

## 2019-03-24 ENCOUNTER — Telehealth (HOSPITAL_COMMUNITY): Payer: Self-pay

## 2019-03-24 NOTE — Telephone Encounter (Signed)
Encounter complete. 

## 2019-03-29 ENCOUNTER — Emergency Department (HOSPITAL_BASED_OUTPATIENT_CLINIC_OR_DEPARTMENT_OTHER): Payer: Medicare Other

## 2019-03-29 ENCOUNTER — Other Ambulatory Visit: Payer: Self-pay

## 2019-03-29 ENCOUNTER — Encounter: Payer: Self-pay | Admitting: *Deleted

## 2019-03-29 ENCOUNTER — Emergency Department (HOSPITAL_BASED_OUTPATIENT_CLINIC_OR_DEPARTMENT_OTHER)
Admission: EM | Admit: 2019-03-29 | Discharge: 2019-03-30 | Disposition: A | Discharge status: Home / Self Care | Payer: Medicare Other | Attending: Emergency Medicine | Admitting: Emergency Medicine

## 2019-03-29 ENCOUNTER — Ambulatory Visit (HOSPITAL_BASED_OUTPATIENT_CLINIC_OR_DEPARTMENT_OTHER)
Admission: RE | Admit: 2019-03-29 | Discharge: 2019-03-29 | Disposition: A | Discharge status: Home / Self Care | Payer: Medicare Other | Source: Ambulatory Visit | Attending: Cardiology | Admitting: Cardiology

## 2019-03-29 ENCOUNTER — Encounter (HOSPITAL_BASED_OUTPATIENT_CLINIC_OR_DEPARTMENT_OTHER): Payer: Self-pay | Admitting: Emergency Medicine

## 2019-03-29 DIAGNOSIS — E039 Hypothyroidism, unspecified: Secondary | ICD-10-CM | POA: Insufficient documentation

## 2019-03-29 DIAGNOSIS — N189 Chronic kidney disease, unspecified: Secondary | ICD-10-CM | POA: Diagnosis not present

## 2019-03-29 DIAGNOSIS — R531 Weakness: Secondary | ICD-10-CM

## 2019-03-29 DIAGNOSIS — Z79899 Other long term (current) drug therapy: Secondary | ICD-10-CM | POA: Insufficient documentation

## 2019-03-29 DIAGNOSIS — R079 Chest pain, unspecified: Secondary | ICD-10-CM | POA: Insufficient documentation

## 2019-03-29 LAB — MYOCARDIAL PERFUSION IMAGING
LV dias vol: 62 mL (ref 46–106)
LV sys vol: 20 mL
Peak HR: 100 {beats}/min
Rest HR: 84 {beats}/min
SDS: 1
SRS: 4
SSS: 5
TID: 1.61

## 2019-03-29 LAB — CBC
HCT: 30.2 % — ABNORMAL LOW (ref 36.0–46.0)
Hemoglobin: 9.6 g/dL — ABNORMAL LOW (ref 12.0–15.0)
MCH: 28.1 pg (ref 26.0–34.0)
MCHC: 31.8 g/dL (ref 30.0–36.0)
MCV: 88.3 fL (ref 80.0–100.0)
Platelets: 155 10*3/uL (ref 150–400)
RBC: 3.42 MIL/uL — ABNORMAL LOW (ref 3.87–5.11)
RDW: 13.3 % (ref 11.5–15.5)
WBC: 2.9 10*3/uL — ABNORMAL LOW (ref 4.0–10.5)
nRBC: 0 % (ref 0.0–0.2)

## 2019-03-29 LAB — DIFFERENTIAL
Basophils Absolute: 0 10*3/uL (ref 0.0–0.1)
Basophils Relative: 1 %
Eosinophils Absolute: 0.1 10*3/uL (ref 0.0–0.5)
Eosinophils Relative: 2 %
Lymphocytes Relative: 28 %
Lymphs Abs: 0.8 10*3/uL (ref 0.7–4.0)
Monocytes Absolute: 0.5 10*3/uL (ref 0.1–1.0)
Monocytes Relative: 17 %
Neutro Abs: 1.5 10*3/uL — ABNORMAL LOW (ref 1.7–7.7)
Neutrophils Relative %: 53 %

## 2019-03-29 LAB — BASIC METABOLIC PANEL
Anion gap: 8 (ref 5–15)
BUN: 32 mg/dL — ABNORMAL HIGH (ref 8–23)
CO2: 27 mmol/L (ref 22–32)
Calcium: 9.1 mg/dL (ref 8.9–10.3)
Chloride: 100 mmol/L (ref 98–111)
Creatinine, Ser: 1.12 mg/dL — ABNORMAL HIGH (ref 0.44–1.00)
GFR calc Af Amer: 58 mL/min — ABNORMAL LOW (ref >60)
GFR calc non Af Amer: 50 mL/min — ABNORMAL LOW (ref >60)
Glucose, Bld: 113 mg/dL — ABNORMAL HIGH (ref 70–99)
Potassium: 4.2 mmol/L (ref 3.5–5.1)
Sodium: 135 mmol/L (ref 135–145)

## 2019-03-29 MED ORDER — AMINOPHYLLINE 25 MG/ML IV SOLN
75.0000 mg | Freq: Once | INTRAVENOUS | Status: AC
  Administered 2019-03-29: 75 mg via INTRAVENOUS

## 2019-03-29 MED ORDER — TECHNETIUM TC 99M TETROFOSMIN IV KIT
9.3000 | PACK | Freq: Once | INTRAVENOUS | Status: AC | PRN
  Administered 2019-03-29: 9.3 via INTRAVENOUS
  Filled 2019-03-29: qty 10

## 2019-03-29 MED ORDER — TECHNETIUM TC 99M TETROFOSMIN IV KIT
32.1000 | PACK | Freq: Once | INTRAVENOUS | Status: AC | PRN
  Administered 2019-03-29: 32.1 via INTRAVENOUS
  Filled 2019-03-29: qty 33

## 2019-03-29 MED ORDER — REGADENOSON 0.4 MG/5ML IV SOLN
0.4000 mg | Freq: Once | INTRAVENOUS | Status: AC
  Administered 2019-03-29: 0.4 mg via INTRAVENOUS

## 2019-03-29 NOTE — ED Triage Notes (Signed)
Patient BIB [facility] for increased generalized weakness and hypotension. Per EMS patient had stress test done this afternoon which was inconclusive, patient also has history of hypotension and has been seen previously. Patient aox4, no pain at this time.

## 2019-03-29 NOTE — ED Provider Notes (Signed)
[facility] Provider Note: Georgena Spurling, MD, FACEP  CSN: [id number] MRN: [id number] ARRIVAL: 03/29/19 at 2127 ROOM: [place]  Weakness   HISTORY OF PRESENT ILLNESS  03/29/19 11:31 PM Megan Brewer is a 73 y.o. female who is being treated for lung cancer.  She has had several episodes of weakness over the past 6 weeks.  She is here with weakness that began about noon today.  The weakness is generalized and worse with standing.  It is associated with lightheadedness, worse with standing but also present when seated, which is itself associated with transient nausea and blurred vision.  She denies fever, vomiting, diarrhea or pain.  She was recently treated for asymptomatic bacteriuria but denies urinary changes presently.  She has had shaking but is not sure if this represents chills.  She has baseline shortness of breath related to her lung cancer which has not acutely changed.  Symptoms have not improved with what she reports to be aggressive oral fluid intake.   Past Medical History:  Diagnosis Date  . Arachnoiditis   . Bipolar disorder ([place])   . Chronic back pain   . Chronic pain   . Chronic post-thoracotomy pain   . CKD (chronic kidney disease)   . GERD (gastroesophageal reflux disease)   . Hypogammaglobulinemia ([place])   . Hypotension   . Hypothyroid   . 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 Lung cancer ([place]) 2011, 2014  . Memory loss   . Migraine   . Opioid abuse ([place])   . Osteoporosis   . Presence of neurostimulator   . Recurrent upper respiratory infection (URI)   . Restless legs   . Sleep apnea   . Tremor   . Urge incontinence   . Urticaria    long time ago, nothing recent    Past Surgical History:  Procedure Laterality Date  . ABDOMINAL HYSTERECTOMY    . CARPAL TUNNEL RELEASE    . CATARACT EXTRACTION Bilateral 2019  . CHOLECYSTECTOMY    . COLONOSCOPY  2015   [facility]  . CT LUNG SCREENING  2018  . DG  BONE DENSITY ([place] HX)   2018  . DIAGNOSTIC MAMMOGRAM  2019  . GASTRIC BYPASS    . LUNG CANCER SURGERY  02/2010  . SPINAL CORD STIMULATOR IMPLANT      Family History  Problem Relation Age of Onset  . Hypertension Mother   . GER disease Mother   . Dementia Mother   . Osteoporosis Mother   . Arthritis Mother   . Bipolar disorder Mother   . Parkinson's disease Father   . Congestive Heart Failure Father   . Pneumonia Father   . Arthritis Father   . Dementia Father   . Depression Father   . Asthma Sister   . Depression Sister   . Bipolar disorder Brother   . Asthma Daughter     Social History   Tobacco Use  . Smoking status: Never Smoker  . Smokeless tobacco: Never Used  Substance Use Topics  . Alcohol use: Not Currently  . Drug use: Never    Prior to Admission medications   Medication Sig Start Date End Date Taking? Authorizing Provider  acetaminophen (TYLENOL) 650 MG CR tablet Take 1,300 mg by mouth every 8 (eight) hours as needed for pain or fever.     [provider]  Apoaequorin (PREVAGEN PO) Take 1 capsule by mouth daily. 1 by mouth daily    [provider]  Bacillus Coagulans-Inulin (ALIGN PREBIOTIC-PROBIOTIC PO) Take 1 capsule by mouth daily. 1 by mouth daily    [provider]  Buprenorphine HCl-Naloxone HCl (SUBOXONE) 4-1 MG FILM Place 1 Film under the tongue daily. 1 daily    [provider]  Calcium Citrate 200 MG TABS Take 1 tablet by mouth daily.  12/02/16   [provider]  cycloSPORINE (RESTASIS) 0.05 % ophthalmic emulsion Place 1 drop into both eyes 2 (two) times daily.    [provider]  dexlansoprazole (DEXILANT) 60 MG capsule Take 1 capsule (60 mg total) by mouth daily. 01/25/19   Armbruster, Carlota Raspberry, MD  Erenumab-aooe (AIMOVIG [place]) Inject into the skin every 30 (thirty) days.     [provider]  immune globulin, human, (GAMMAGARD S/D LESS IGA) 10 g injection Inject into the vein every 21 ( twenty-one) days.      [provider]  levothyroxine (SYNTHROID) 75 MCG tablet Take 1 tablet (75 mcg total) by mouth daily before breakfast. 01/27/19   [name], NP  lubiprostone (AMITIZA) 24 MCG capsule Take 24 mcg by mouth daily as needed for constipation.     [provider]  magnesium oxide (MAG-OX) 400 MG tablet Take 400 mg by mouth daily.     [provider]  Melatonin 5 MG CAPS Take 5 mg by mouth at bedtime as needed (sleep).     [provider]  Misc Natural Products (CRAN-B-OTC PO) Take 1 capsule by mouth daily. 1 daily     [provider]  OLANZapine (ZYPREXA) 2.5 MG tablet Take 1 tablet (2.5 mg total) by mouth at bedtime. 03/23/19 04/22/19  Thayer Headings, PMHNP  ondansetron (ZOFRAN-ODT) 8 MG disintegrating tablet Take 1 tablet (8 mg total) by mouth every 8 (eight) hours as needed for nausea or vomiting. Patient not taking: Reported on 03/23/2019 01/05/19   Yetta Flock, MD  potassium chloride SA (K-DUR) 20 MEQ tablet TAKE 2 TABLETS BY MOUTH DAILY (MAY DISSOLVE IN WATER) 03/22/19   [name], NP  pyridOXINE (VITAMIN B-6) 100 MG tablet Take 100 mg by mouth daily.    [provider]  rOPINIRole (REQUIP) 0.5 MG tablet Take 0.5 mg by mouth 2 (two) times a day.     [provider]  sucralfate (CARAFATE) 1 GM/10ML suspension Take 200 mg by mouth 3 (three) times daily as needed (indigestion).     [provider]  tiZANidine (ZANAFLEX) 2 MG tablet Take 2 mg by mouth 3 (three) times daily as needed for muscle spasms.     [provider]  torsemide (DEMADEX) 20 MG tablet Take 20 mg by mouth daily.  11/04/12   [provider]  vitamin B-12 (CYANOCOBALAMIN) 1000 MCG tablet Take 1,000 mcg by mouth daily.     [provider]    Allergies Butorphanol tartrate, Tetracycline, Corticosteroids, Cefixime, Ciprofloxacin, Doxycycline, Gabapentin, Isometheptene-dichloral-apap, Ketorolac, Prednisone, Promethazine,  Stadol [butorphanol], Erythromycin base, and Propoxyphene   REVIEW OF SYSTEMS  Negative except as noted here or in the History of Present Illness.   PHYSICAL EXAMINATION  Initial Vital Signs Blood pressure (!) 100/54, pulse 69, temperature 98.6 F (37 C), temperature source Oral, resp. rate 14, height 5\' 2"  (1.575 m), weight 50.8 kg, SpO2 100 %.  Examination General: Well-developed, frail female in no acute distress; appearance consistent with age of record HENT: normocephalic; atraumatic Eyes: pupils equal, round and reactive to light; extraocular muscles intact Neck: supple Heart: regular rate and rhythm; soft  holosystolic murmur at the right upper sternal border Lungs: clear to auscultation bilaterally Chest: Port-A-Cath left upper chest Abdomen: soft; nondistended; nontender; bowel sounds present Extremities: No deformity; full range of motion; pulses normal; trace edema of lower legs Neurologic: Awake, alert and oriented; motor function intact in all extremities and symmetric; no facial droop Skin: Warm and dry Psychiatric: Normal mood and affect   RESULTS  Summary of this visit's results, reviewed by myself:   EKG Interpretation  Date/Time:  Wednesday March 29 2019 21:43:12 EDT Ventricular Rate:  68 PR Interval:    QRS Duration: 117 QT Interval:  427 QTC Calculation: 455 R Axis:   46 Text Interpretation:  Sinus rhythm Incomplete left bundle branch block Anterior Q waves, possibly due to ILBBB No significant change was found Confirmed by Shanon Rosser [phone]) on 03/30/2019 12:13:58 AM      Laboratory Studies: Results for orders placed or performed during the hospital encounter of 03/29/19 (from the past 24 hour(s))  Basic metabolic panel     Status: Abnormal   Collection Time: 03/29/19 10:06 PM  Result Value Ref Range   Sodium 135 135 - 145 mmol/L   Potassium 4.2 3.5 - 5.1 mmol/L   Chloride 100 98 - 111 mmol/L   CO2 27 22 - 32 mmol/L   Glucose, Bld 113 (H) 70 -  99 mg/dL   BUN 32 (H) 8 - 23 mg/dL   Creatinine, Ser 1.12 (H) 0.44 - 1.00 mg/dL   Calcium 9.1 8.9 - 10.3 mg/dL   GFR calc non Af Amer 50 (L) >60 mL/min   GFR calc Af Amer 58 (L) >60 mL/min   Anion gap 8 5 - 15  CBC     Status: Abnormal   Collection Time: 03/29/19 10:06 PM  Result Value Ref Range   WBC 2.9 (L) 4.0 - 10.5 K/uL   RBC 3.42 (L) 3.87 - 5.11 MIL/uL   Hemoglobin 9.6 (L) 12.0 - 15.0 g/dL   HCT 30.2 (L) 36.0 - 46.0 %   MCV 88.3 80.0 - 100.0 fL   MCH 28.1 26.0 - 34.0 pg   MCHC 31.8 30.0 - 36.0 g/dL   RDW 13.3 11.5 - 15.5 %   Platelets 155 150 - 400 K/uL   nRBC 0.0 0.0 - 0.2 %  Differential     Status: Abnormal   Collection Time: 03/29/19 10:06 PM  Result Value Ref Range   Neutrophils Relative % 53 %   Neutro Abs 1.5 (L) 1.7 - 7.7 K/uL   Lymphocytes Relative 28 %   Lymphs Abs 0.8 0.7 - 4.0 K/uL   Monocytes Relative 17 %   Monocytes Absolute 0.5 0.1 - 1.0 K/uL   Eosinophils Relative 2 %   Eosinophils Absolute 0.1 0.0 - 0.5 K/uL   Basophils Relative 1 %   Basophils Absolute 0.0 0.0 - 0.1 K/uL  Urinalysis, Routine w reflex microscopic     Status: None   Collection Time: 03/29/19 11:43 PM  Result Value Ref Range   Color, Urine YELLOW YELLOW   APPearance CLEAR CLEAR   Specific Gravity, Urine 1.015 1.005 - 1.030   pH 6.0 5.0 - 8.0   Glucose, UA NEGATIVE NEGATIVE mg/dL   Hgb urine dipstick NEGATIVE NEGATIVE   Bilirubin Urine NEGATIVE NEGATIVE   Ketones, ur NEGATIVE NEGATIVE mg/dL   Protein, ur NEGATIVE NEGATIVE mg/dL   Nitrite NEGATIVE NEGATIVE   Leukocytes,Ua NEGATIVE NEGATIVE   Imaging Studies: Dg Chest 2 View  Result Date: 03/30/2019 CLINICAL DATA:  Lung cancer increased weakness EXAM: CHEST - 2 VIEW COMPARISON:  March 16, 2019. FINDINGS: Again noted is surgical suture within the right lower lung. There is hyperinflation of the lung zones with flattening of the hemidiaphragms. No airspace consolidation or pleural effusion. Again noted is a left-sided MediPort  catheter the tip of the superior cavoatrial junction. Overlying spinal stimulator lead seen. Acute osseous abnormality. IMPRESSION: No acute cardiopulmonary process. Electronically Signed   By: Prudencio Pair M.D.   On: 03/30/2019 00:05    ED COURSE and MDM  Nursing notes and initial vitals signs, including pulse oximetry, reviewed.  Vitals:   03/29/19 2236 03/30/19 0015 03/30/19 0117 03/30/19 0123  BP: (!) 100/54 99/64  120/67  Pulse: 69 83 76 75  Resp: 14 15 16 16   Temp:      TempSrc:      SpO2: 100% 99% 100% 100%  Weight:      Height:       1:41 AM Patient feeling better after IV fluid bolus.  Blood pressure has improved.  The patient further states that her episodes of weakness tend to occur after eating.  She feels weak and usually improves after taking a 2-hour nap.  She has never been diagnosed with hypoglycemia nor has this been considered in her past.  She was advised to purchase an over-the-counter blood sugar monitor and check her sugars when she becomes symptomatic.  PROCEDURES    ED DIAGNOSES     ICD-10-CM   1. Generalized weakness  R53.1        Caydence Koenig, MD 03/30/19 [phone]

## 2019-03-30 ENCOUNTER — Telehealth: Payer: Self-pay | Admitting: Nurse Practitioner

## 2019-03-30 ENCOUNTER — Ambulatory Visit
Admission: RE | Admit: 2019-03-30 | Discharge: 2019-03-30 | Disposition: A | Discharge status: Home / Self Care | Payer: Medicare Other | Source: Ambulatory Visit | Attending: Nurse Practitioner | Admitting: Nurse Practitioner

## 2019-03-30 DIAGNOSIS — M810 Age-related osteoporosis without current pathological fracture: Secondary | ICD-10-CM

## 2019-03-30 LAB — URINALYSIS, ROUTINE W REFLEX MICROSCOPIC
Bilirubin Urine: NEGATIVE
Glucose, UA: NEGATIVE mg/dL
Hgb urine dipstick: NEGATIVE
Ketones, ur: NEGATIVE mg/dL
Leukocytes,Ua: NEGATIVE
Nitrite: NEGATIVE
Protein, ur: NEGATIVE mg/dL
Specific Gravity, Urine: 1.015 (ref 1.005–1.030)
pH: 6 (ref 5.0–8.0)

## 2019-03-30 MED ORDER — SODIUM CHLORIDE 0.9 % IV BOLUS
1000.0000 mL | Freq: Once | INTRAVENOUS | Status: AC
  Administered 2019-03-30: 1000 mL via INTRAVENOUS

## 2019-03-30 MED ORDER — HEPARIN SOD (PORK) LOCK FLUSH 100 UNIT/ML IV SOLN
500.0000 [IU] | Freq: Once | INTRAVENOUS | Status: AC
  Administered 2019-03-30: 500 [IU]
  Filled 2019-03-30: qty 5

## 2019-03-30 NOTE — ED Notes (Signed)
Patient verbalizes understanding of discharge instructions. Opportunity for questioning and answers were provided. Armband removed by staff, pt discharged from ED.  

## 2019-03-30 NOTE — Discharge Instructions (Signed)
Your symptoms could be related to hypoglycemia.  You may purchase blood sugar monitor over-the-counter at any drugstore.  When you feel symptomatic please check your blood sugar and keep a log of these readings.  Blood sugar lower than 70 should prompt medical evaluation.

## 2019-03-30 NOTE — Telephone Encounter (Signed)
Per [facility]  Erica LPN states pt seen at ED 03/29/19 &  ED MD suggested of hypoglocemia & not sure if you wanted to move appt in sept up sooner.  After eating eggs/cheese & smoothy for breakfast this morning, she has started to lil feel better.  Thanks, Vilinda Blanks

## 2019-03-30 NOTE — Telephone Encounter (Signed)
Patient scheduled an appointment for Monday with Megan Brewer

## 2019-04-03 ENCOUNTER — Encounter: Payer: Self-pay | Admitting: Nurse Practitioner

## 2019-04-03 ENCOUNTER — Ambulatory Visit (INDEPENDENT_AMBULATORY_CARE_PROVIDER_SITE_OTHER): Payer: Medicare Other | Admitting: Nurse Practitioner

## 2019-04-03 ENCOUNTER — Other Ambulatory Visit: Payer: Self-pay

## 2019-04-03 ENCOUNTER — Telehealth: Payer: Self-pay | Admitting: Nurse Practitioner

## 2019-04-03 VITALS — BP 120/72 | HR 81 | Temp 98.2°F | Ht 63.0 in | Wt 116.0 lb

## 2019-04-03 DIAGNOSIS — K5903 Drug induced constipation: Secondary | ICD-10-CM

## 2019-04-03 DIAGNOSIS — G2581 Restless legs syndrome: Secondary | ICD-10-CM | POA: Diagnosis not present

## 2019-04-03 DIAGNOSIS — R634 Abnormal weight loss: Secondary | ICD-10-CM

## 2019-04-03 DIAGNOSIS — E039 Hypothyroidism, unspecified: Secondary | ICD-10-CM | POA: Diagnosis not present

## 2019-04-03 DIAGNOSIS — G43919 Migraine, unspecified, intractable, without status migrainosus: Secondary | ICD-10-CM

## 2019-04-03 DIAGNOSIS — R531 Weakness: Secondary | ICD-10-CM

## 2019-04-03 DIAGNOSIS — M810 Age-related osteoporosis without current pathological fracture: Secondary | ICD-10-CM

## 2019-04-03 DIAGNOSIS — F329 Major depressive disorder, single episode, unspecified: Secondary | ICD-10-CM

## 2019-04-03 MED ORDER — ROPINIROLE HCL 0.5 MG PO TABS: 0.5000 mg | ORAL_TABLET | Freq: Every day | ORAL | 1 refills | Status: DC

## 2019-04-03 MED ORDER — RELISTOR 12 MG/0.6ML ~~LOC~~ SOLN: 12.0000 mg | Freq: Every day | SUBCUTANEOUS | 12 refills | Status: DC

## 2019-04-03 NOTE — Progress Notes (Signed)
Careteam: Patient Care Team: [name], NP as PCP - General (Geriatric Medicine)  Advanced Directive information Does Patient Have a Medical Advance Directive?: No, Would patient like information on creating a medical advance directive?: No - Patient declined  Allergies  Allergen Reactions  . Butorphanol Tartrate Other (See Comments)    "like someone is brushing my brain with a brillo pad"  . Tetracycline Nausea Only    Causes ringing in ears, dizziness, migraine, nausea  . Corticosteroids Other (See Comments)    12/02/2016 interview by Melissa Montane: Oral dosing causes migraines/nausea/tinnitus. Prev tolerated IV and intranasal admin w/o difficulty.  . Cefixime Other (See Comments)    Headache   . Ciprofloxacin Other (See Comments)    Headache, dizziness, ringing in the ears  . Doxycycline Other (See Comments)    unknown  . Gabapentin     Throat swells/closes  . Isometheptene-Dichloral-Apap Other (See Comments)    unknown  . Ketorolac     12/02/2016 interview by DXA: Oral dosing prednisone/corticosteroids causes migraines/nausea/tinnitus. Prev tolerated IV and intranasal admin w/o difficulty.  . Prednisone Other (See Comments)    Migraine, dizzy, ringing in ears-can take it IV 12/02/2016 interview by [name]: Oral prednisone dosing causes migraines/nausea/tinnitus. Prev tolerated IV and intranasal admin w/o difficulty.  . Promethazine Other (See Comments)    causes severe abdominal pain when taken IV; however she can take PO or IM  . Stadol [Butorphanol]     Cerebral pain  . Erythromycin Base Diarrhea and Itching    Severe diarrhea  . Propoxyphene Nausea Only    Chief Complaint  Patient presents with  . Medical Management of Chronic Issues    3 month follow-up   . Immunizations    Discuss PNA recommendation   . Quality Metric Gaps    Discuss need for Mammogram and Colonoscopy   . Best Practice Recommendations    Discuss Hep C recommendation   . Follow-up    ER  follow-up for possible hypoglycemic episode      HPI: Patient is a 72 y.o. female seen in the office today for routine follow up.  Reports 4 visit to the ED in the last months. Felt like it could be due to dehydration or hypoglycemia. States she is eating as much as she can. Met with the dietitan and following recommendation Depression medication changed and she has better appetite.  No esophagitis currently  States episodes come on after eating but last episode was different and she went to [employer] and got a smoothie and they improved.  States she had bacon and cheese eggs prior to last episode.  Prior to last ED visit had burger, OR and a soda.  Reports she has had these episodes since she was 32.    States blood pressure has been "high" but controlled. Normal for her 90/60, now 110/80.   Wearing compression hose for a while.   GERD/nausea- Hx of Gastric bypass, Gastroenterology has treated esophagitis, Seeing GI again in September. Nausea associated with feeling weak, blurred vision and tremor episode.   Migraine- much better controlled after injection, currently being seen by the headache clinic.  Osteoporosis-previously on prolia but reports has been about a year since last injection. Continues on calcium and Vit D -had dexa last week, rheumatology following and giving prolia.    Lower leg edema -Continues on torsemide and potassium supplement, leg swelling controlled. Worse in the evening -has been ongoing since she was a teenager  Depression-stopped zoloft and started  zyprexa, mood has been so much better, getting dressed and doing therapy.   Chronic post-thoracotomy pain- Chronic pain, followed bypain management and remains on suboxone. She also takes tizanidine as needed.  Recurrent UTI - Followed by Urology at [facility] at this time. No current symptoms.   Malignant neoplasm of bronchus and lung [facility])- S/pthoracotomyand being monitored by oncologist at [facility],  [place] surveillance scan in July 2019and then repeat CT due to weight loss which was without signs ofmetastasesper epic review. Going yearly at this time.  Hypogammaglobulinemia - continues to get IVIG infusion per home health, has seen allergist plan to continue current treatment  RLS- looked on line and Zyprexa and ropinirole potentially have reaction. Has tired stopping in the past but had bad side effects. Would like to cut down to daily to see if this helps with her increase in episodes   Constipation- significant, taking amitiza and not effective.    Review of Systems:  Review of Systems  Constitutional: Positive for malaise/fatigue. Negative for chills, fever and weight loss (improved).  HENT: Positive for hearing loss and tinnitus. Negative for congestion.   Respiratory: Negative for sputum production and shortness of breath.   Cardiovascular: Negative for chest pain, palpitations and leg swelling.  Gastrointestinal: Positive for constipation. Negative for abdominal pain, heartburn and nausea.  Genitourinary: Positive for frequency and urgency.       Incontinence  Urgency and frequency stable  Musculoskeletal: Positive for back pain, joint pain, myalgias and neck pain.  Skin: Positive for itching.  Neurological: Positive for dizziness and weakness. Negative for headaches.  Psychiatric/Behavioral: Positive for memory loss. Negative for depression. The patient is not nervous/anxious and does not have insomnia.        Depression has improved recently   Past Medical History:  Diagnosis Date  . Arachnoiditis   . Bipolar disorder ([place])   . Chronic back pain   . Chronic pain   . Chronic post-thoracotomy pain   . CKD (chronic kidney disease)   . GERD (gastroesophageal reflux disease)   . Hypogammaglobulinemia ([place])   . Hypotension   . Hypothyroid   . 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 Lung cancer ([place]) 2011, 2014  . Memory loss   . Migraine   . Opioid abuse ([place])    . Osteoporosis   . Presence of neurostimulator   . Recurrent upper respiratory infection (URI)   . Restless legs   . Sleep apnea   . Tremor   . Urge incontinence   . Urticaria    long time ago, nothing recent   Past Surgical History:  Procedure Laterality Date  . ABDOMINAL HYSTERECTOMY    . CARPAL TUNNEL RELEASE    . CATARACT EXTRACTION Bilateral 2019  . CHOLECYSTECTOMY    . COLONOSCOPY  2015   [facility]  . CT LUNG SCREENING  2018  . DG  BONE DENSITY ([place] HX)  2018  . DIAGNOSTIC MAMMOGRAM  2019  . GASTRIC BYPASS    . LUNG CANCER SURGERY  02/2010  . SPINAL CORD STIMULATOR IMPLANT     Social History:   reports that she has never smoked. She has never used smokeless tobacco. She reports previous alcohol use. She reports that she does not use drugs.  Family History  Problem Relation Age of Onset  . Hypertension Mother   . GER disease Mother   . Dementia Mother   . Osteoporosis Mother   . Arthritis Mother   . Bipolar disorder Mother   .  Parkinson's disease Father   . Congestive Heart Failure Father   . Pneumonia Father   . Arthritis Father   . Dementia Father   . Depression Father   . Asthma Sister   . Depression Sister   . Bipolar disorder Brother   . Asthma Daughter     Medications: Patient's Medications  New Prescriptions   No medications on file  Previous Medications   ACETAMINOPHEN (TYLENOL) 650 MG CR TABLET    Take 1,300 mg by mouth every 8 (eight) hours as needed for pain or fever.    APOAEQUORIN (PREVAGEN PO)    Take 1 capsule by mouth daily.    BACILLUS COAGULANS-INULIN (ALIGN PREBIOTIC-PROBIOTIC PO)    Take 1 capsule by mouth daily.    BUPRENORPHINE HCL-NALOXONE HCL (SUBOXONE) 4-1 MG FILM    Place 1 Film under the tongue daily.    CALCIUM CITRATE 200 MG TABS    Take 1 tablet by mouth daily.    CYCLOSPORINE (RESTASIS) 0.05 % OPHTHALMIC EMULSION    Place 1 drop into both eyes 2 (two) times daily.   DEXLANSOPRAZOLE (DEXILANT) 60 MG CAPSULE    Take 1 capsule  (60 mg total) by mouth daily.   ERENUMAB-AOOE (AIMOVIG [place])    Inject into the skin every 30 (thirty) days.    IMMUNE GLOBULIN, HUMAN, (GAMMAGARD S/D LESS IGA) 10 G INJECTION    Inject into the vein every 21 ( twenty-one) days.    LEVOTHYROXINE (SYNTHROID) 75 MCG TABLET    Take 1 tablet (75 mcg total) by mouth daily before breakfast.   LUBIPROSTONE (AMITIZA) 24 MCG CAPSULE    Take 24-72 mcg by mouth daily as needed for constipation.    MAGNESIUM OXIDE (MAG-OX) 400 MG TABLET    Take 400 mg by mouth daily.    MELATONIN 5 MG CAPS    Take 5 mg by mouth at bedtime as needed (sleep).    MISC NATURAL PRODUCTS (CRAN-B-OTC PO)    Take 1 capsule by mouth daily. 1 daily    OLANZAPINE (ZYPREXA) 2.5 MG TABLET    Take 1 tablet (2.5 mg total) by mouth at bedtime.   ONDANSETRON (ZOFRAN-ODT) 8 MG DISINTEGRATING TABLET    Take 1 tablet (8 mg total) by mouth every 8 (eight) hours as needed for nausea or vomiting.   POTASSIUM CHLORIDE SA (K-DUR) 20 MEQ TABLET    TAKE 2 TABLETS BY MOUTH DAILY (MAY DISSOLVE IN WATER)   PYRIDOXINE (VITAMIN B-6) 100 MG TABLET    Take 100 mg by mouth daily.   ROPINIROLE (REQUIP) 0.5 MG TABLET    Take 0.5 mg by mouth 2 (two) times a day.    SUCRALFATE (CARAFATE) 1 GM/10ML SUSPENSION    Take 200 mg by mouth 3 (three) times daily as needed (indigestion).    TIZANIDINE (ZANAFLEX) 2 MG TABLET    Take 2 mg by mouth 3 (three) times daily as needed for muscle spasms.    TORSEMIDE (DEMADEX) 20 MG TABLET    Take 20 mg by mouth daily.    VITAMIN B-12 (CYANOCOBALAMIN) 1000 MCG TABLET    Take 1,000 mcg by mouth daily.   Modified Medications   No medications on file  Discontinued Medications   No medications on file    Physical Exam:  Vitals:   04/03/19 0922  BP: 120/72  Pulse: 81  Temp: 98.2 F (36.8 C)  TempSrc: Oral  Weight: 116 lb (52.6 kg)  Height: '5\' 3"'$  (1.6 m)   Body mass  index is 20.55 kg/m. Wt Readings from Last 3 Encounters:  04/03/19 116 lb (52.6 kg)  03/29/19 112 lb (50.8  kg)  03/29/19 113 lb (51.3 kg)    Physical Exam Constitutional:      General: She is not in acute distress.    Appearance: She is well-developed. She is not diaphoretic.  HENT:     Head: Normocephalic and atraumatic.  Eyes:     Conjunctiva/sclera: Conjunctivae normal.     Pupils: Pupils are equal, round, and reactive to light.  Neck:     Musculoskeletal: Normal range of motion and neck supple.  Cardiovascular:     Rate and Rhythm: Normal rate and regular rhythm.     Heart sounds: Normal heart sounds.  Pulmonary:     Effort: Pulmonary effort is normal.     Breath sounds: Normal breath sounds.  Abdominal:     General: Bowel sounds are normal.     Palpations: Abdomen is soft.  Musculoskeletal:        General: No tenderness.     Right lower leg: No edema.     Left lower leg: No edema.  Skin:    General: Skin is warm and dry.     Coloration: Skin is pale.     Findings: No rash.  Neurological:     Mental Status: She is alert and oriented to person, place, and time.  Psychiatric:        Mood and Affect: Mood normal. Affect is flat.        Behavior: Behavior normal.     Labs reviewed: Basic Metabolic Panel: Recent Labs    01/27/19 1048  02/27/19 1745  03/06/19 1624 03/16/19 2222 03/29/19 2206  NA 142  --  142   < > 137 138 135  K 4.5  --  4.1   < > 4.3 4.5 4.2  CL 104  --  102   < > 101 103 100  CO2 29  --  29   < > '26 26 27  '$ GLUCOSE 70  --  83   < > 93 102* 113*  BUN 25  --  30*   < > 34* 38* 32*  CREATININE 1.00*   < > 1.56*   < > 1.38* 1.13* 1.12*  CALCIUM 9.3  --  9.3   < > 8.8* 8.7* 9.1  MG  --   --  2.0  --   --   --   --   TSH 4.58*  --   --   --   --   --   --    < > = values in this interval not displayed.   Liver Function Tests: Recent Labs    02/27/19 1745 03/06/19 1624 03/16/19 2222  AST '23 22 24  '$ ALT '17 15 14  '$ ALKPHOS 60 51 53  BILITOT 0.3 0.3 0.3  PROT 6.5 6.7 6.2*  ALBUMIN 3.5 3.4* 3.4*   No results for input(s): LIPASE, AMYLASE in the  last 8760 hours. No results for input(s): AMMONIA in the last 8760 hours. CBC: Recent Labs    03/06/19 1119 03/06/19 1624 03/16/19 2222 03/29/19 2206  WBC 6.2 4.9 4.3 2.9*  NEUTROABS 4,123 3.4  --  1.5*  HGB 12.3 10.6* 9.9* 9.6*  HCT 37.4 33.6* 32.3* 30.2*  MCV 86.4 88.9 90.0 88.3  PLT 199 139* 159 155   Lipid Panel: No results for input(s): CHOL, HDL, LDLCALC, TRIG, CHOLHDL, LDLDIRECT in the last 8760 hours.  TSH: Recent Labs    01/27/19 1048  TSH 4.58*   A1C: No results found for: HGBA1C   Assessment/Plan 1. RLS (restless legs syndrome) Will attempt to cut back on requip at thi stime - rOPINIRole (REQUIP) 0.5 MG tablet; Take 1 tablet (0.5 mg total) by mouth daily.  Dispense: 30 tablet; Refill: 1  2. Drug-induced constipation Ongoing, to stop amitiza and start relistor daily  Injection for constipation.  - methylnaltrexone (RELISTOR) 12 MG/0.6ML SOLN injection; Inject 0.6 mLs (12 mg total) into the skin daily.  Dispense: 2.1 mL; Refill: 12  3. Weight loss Has consulted with dietitian and weight has remained stable, has actually gained  4. Acquired hypothyroidism TSH stable, continues on synthroid 75 mcg.   5. Intractable migraine without status migrainosus, unspecified migraine type Controlled at this time.  6. Osteoporosis, unspecified osteoporosis type, unspecified pathological fracture presence -given copy of recent dexa scan, states her rheumatologist has been coordinating her prolia injections   7. Major depressive disorder with current active episode, unspecified depression episode severity, unspecified whether recurrent Following with psych, recently changed from zoloft to zyprexa with significant improvement in symptoms. Reports she is now genuinely smiling and laughing for the first time in decades and looking fwd to seeing her grandchildren. She is participating in therapy now.    8. Weakness Ongoing, with multiple ED visits, thought to be due to  hypoglycemia but she has not checked blood sugars during events. Told her to journal this events to see if there is a common theme. She states she has always had episodes they are just now more frequent. To monitor blood sugars at time of event and notify as well.   Next appt: 3 months.  Carlos American. [place], [facility] [phone]

## 2019-04-03 NOTE — Telephone Encounter (Signed)
Patient stated that when she got home from her appointment with [facility] today she had a message to call office.  She stated that she did not have person's name who left message and she doesn't know what call was about.   Thanks  [employer]

## 2019-04-03 NOTE — Patient Instructions (Addendum)
To monitor blood sugar when having episodes Keep a journal of what you have eaten and doing prior to episodes  Decrease ropinirole to daily  To start relistor injection for constipation, can take up to 1 time daily if needed To stop amitiza (*should not need both)

## 2019-04-03 NOTE — Telephone Encounter (Signed)
I don't see anything in patient's chart. Forwarded to [employer].

## 2019-04-04 NOTE — Telephone Encounter (Signed)
After reviewing chart under imaging a call was placed to patient by Randall Hiss, CMA to discuss bone density test results.  Patient had addressed Bone Density with [name], NP at appointment yesterday.  I called patient to inform her of the above   S.Chrae B/CMA

## 2019-05-01 ENCOUNTER — Ambulatory Visit: Payer: Medicare Other | Admitting: Nurse Practitioner

## 2019-05-01 ENCOUNTER — Telehealth: Payer: Self-pay | Admitting: *Deleted

## 2019-05-01 NOTE — Telephone Encounter (Signed)
Prior Authorization was placed for patient's Relistor through [employer].  Clinical information sent back wanting to know if patient has tried and failed Lactulose and/or any other constipation medication .   [name] wants [place] to call patient regarding this.   Tried calling and had to [facility] to [facility].

## 2019-05-02 ENCOUNTER — Ambulatory Visit (INDEPENDENT_AMBULATORY_CARE_PROVIDER_SITE_OTHER): Payer: Medicare Other | Admitting: Nurse Practitioner

## 2019-05-02 ENCOUNTER — Other Ambulatory Visit: Payer: Self-pay

## 2019-05-02 DIAGNOSIS — K5903 Drug induced constipation: Secondary | ICD-10-CM

## 2019-05-02 DIAGNOSIS — R0789 Other chest pain: Secondary | ICD-10-CM

## 2019-05-02 DIAGNOSIS — Z95828 Presence of other vascular implants and grafts: Secondary | ICD-10-CM

## 2019-05-02 MED ORDER — LACTULOSE 10 GM/15ML PO SOLN: 20.0000 g | Freq: Every day | ORAL | 2 refills | Status: DC

## 2019-05-02 NOTE — Progress Notes (Signed)
This service is provided via telemedicine  No vital signs collected/recorded due to the encounter was a telemedicine visit.   Location of patient (ex: home, work):  Home   Patient consents to a telephone visit:  Yes  Location of the provider (ex: office, home):  Office  Name of any referring provider:  Dani Gobble  Names of all persons participating in the telemedicine service and their role in the encounter:  Rodena Piety, CMA, Alma Downs, patient and Sherrie Mustache, NP  Time spent on call:  5:47 by CMA      Careteam: Patient Care Team: [name], NP as PCP - General (Geriatric Medicine)  Advanced Directive information    Allergies  Allergen Reactions  . Butorphanol Tartrate Other (See Comments)    "like someone is brushing my brain with a brillo pad"  . Tetracycline Nausea Only    Causes ringing in ears, dizziness, migraine, nausea  . Corticosteroids Other (See Comments)    12/02/2016 interview by Melissa Montane: Oral dosing causes migraines/nausea/tinnitus. Prev tolerated IV and intranasal admin w/o difficulty.  . Cefixime Other (See Comments)    Headache   . Ciprofloxacin Other (See Comments)    Headache, dizziness, ringing in the ears  . Doxycycline Other (See Comments)    unknown  . Gabapentin     Throat swells/closes  . Isometheptene-Dichloral-Apap Other (See Comments)    unknown  . Ketorolac     12/02/2016 interview by BTD: Oral dosing prednisone/corticosteroids causes migraines/nausea/tinnitus. Prev tolerated IV and intranasal admin w/o difficulty.  . Prednisone Other (See Comments)    Migraine, dizzy, ringing in ears-can take it IV 12/02/2016 interview by [name]: Oral prednisone dosing causes migraines/nausea/tinnitus. Prev tolerated IV and intranasal admin w/o difficulty.  . Promethazine Other (See Comments)    causes severe abdominal pain when taken IV; however she can take PO or IM  . Stadol [Butorphanol]     Cerebral pain  . Erythromycin Base  Diarrhea and Itching    Severe diarrhea  . Propoxyphene Nausea Only    Chief Complaint  Patient presents with  . Acute Visit    Complains of pain at PortaCath site, requesting Chest X-ray.      HPI: Patient is a 73 y.o. female due to pain at her portacath,  States 1 month ago she went to the ED and took blood and gave fluids through her porta cath and after she got home she noticed small amount of bleeding at the site after this happened she started having sharp shooting jabs of pain that last minutes to hours.  Pain severe enough to put her on her knees.  Goes away on its own until it happens again. Having a pain every several days. Does not matter if she is sleeping, standing, sitting it will randomly occur. Last happened several days ago.  Has had porta-cath for 10 years. First porta-cath 15 years ago and it "rotted out" and had to be removed in pieces. When this happened last time she experienced similar pain. Also having difficulty accessing port which they are currently experiencing.  Tender to touch and has been getting worse over the last 6 month.  Never had any swelling, redness or heat to area Has porta-cath due to her Immuglobin infusion every 3 weeks.   Constipation- prescribed relistor but was not able to get due to insurance. Insurance required prior authorization and she has not taken lactulose yet so they would like her to try this first.  She continues on  amitiza but remains constipated    Review of Systems:  Review of Systems  Constitutional: Negative for chills, fever, malaise/fatigue and weight loss.  HENT: Positive for hearing loss and tinnitus. Negative for congestion.   Respiratory: Negative for sputum production and shortness of breath.   Cardiovascular: Positive for chest pain (tender to touch). Negative for palpitations and leg swelling.  Gastrointestinal: Positive for constipation. Negative for abdominal pain, heartburn and nausea.  Neurological: Negative  for dizziness, weakness and headaches.  Psychiatric/Behavioral: The patient does not have insomnia.     Past Medical History:  Diagnosis Date  . Arachnoiditis   . Bipolar disorder ([place])   . Chronic back pain   . Chronic pain   . Chronic post-thoracotomy pain   . CKD (chronic kidney disease)   . GERD (gastroesophageal reflux disease)   . Hypogammaglobulinemia ([place])   . Hypotension   . Hypothyroid   . 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 Lung cancer ([place]) 2011, 2014  . Memory loss   . Migraine   . Opioid abuse ([place])   . Osteoporosis   . Presence of neurostimulator   . Recurrent upper respiratory infection (URI)   . Restless legs   . Sleep apnea   . Tremor   . Urge incontinence   . Urticaria    long time ago, nothing recent   Past Surgical History:  Procedure Laterality Date  . ABDOMINAL HYSTERECTOMY    . CARPAL TUNNEL RELEASE    . CATARACT EXTRACTION Bilateral 2019  . CHOLECYSTECTOMY    . COLONOSCOPY  2015   [facility]  . CT LUNG SCREENING  2018  . DG  BONE DENSITY ([place] [facility])  2018  . DIAGNOSTIC MAMMOGRAM  2019  . GASTRIC BYPASS    . LUNG CANCER SURGERY  02/2010  . SPINAL CORD STIMULATOR IMPLANT     Social History:   reports that she has never smoked. She has never used smokeless tobacco. She reports previous alcohol use. She reports that she does not use drugs.  Family History  Problem Relation Age of Onset  . Hypertension Mother   . GER disease Mother   . Dementia Mother   . Osteoporosis Mother   . Arthritis Mother   . Bipolar disorder Mother   . Parkinson's disease Father   . Congestive Heart Failure Father   . Pneumonia Father   . Arthritis Father   . Dementia Father   . Depression Father   . Asthma Sister   . Depression Sister   . Bipolar disorder Brother   . Asthma Daughter     Medications: Patient's Medications  New Prescriptions   No medications on file  Previous Medications   ACETAMINOPHEN (TYLENOL) 650 MG CR TABLET    Take 1,300 mg by  mouth every 8 (eight) hours as needed for pain or fever.    APOAEQUORIN (PREVAGEN PO)    Take 1 capsule by mouth daily.    BACILLUS COAGULANS-INULIN (ALIGN PREBIOTIC-PROBIOTIC PO)    Take 1 capsule by mouth daily.    BUPRENORPHINE HCL-NALOXONE HCL (SUBOXONE) 4-1 MG FILM    Place 1 Film under the tongue daily.    CALCIUM CITRATE 200 MG TABS    Take 1 tablet by mouth daily.    CYCLOSPORINE (RESTASIS) 0.05 % OPHTHALMIC EMULSION    Place 1 drop into both eyes 2 (two) times daily.   DEXLANSOPRAZOLE (DEXILANT) 60 MG CAPSULE    Take 1 capsule (60 mg total) by mouth daily.  ERENUMAB-AOOE (AIMOVIG [place])    Inject into the skin every 30 (thirty) days.    IMMUNE GLOBULIN, HUMAN, (GAMMAGARD S/D LESS IGA) 10 G INJECTION    Inject into the vein every 21 ( twenty-one) days.    LEVOTHYROXINE (SYNTHROID) 75 MCG TABLET    Take 1 tablet (75 mcg total) by mouth daily before breakfast.   LUBIPROSTONE (AMITIZA) 24 MCG CAPSULE    Take 24-72 mcg by mouth daily as needed for constipation.    MAGNESIUM OXIDE (MAG-OX) 400 MG TABLET    Take 400 mg by mouth daily.    MELATONIN 5 MG CAPS    Take 5 mg by mouth at bedtime as needed (sleep).    METHYLNALTREXONE (RELISTOR) 12 MG/0.6ML SOLN INJECTION    Inject 0.6 mLs (12 mg total) into the skin daily.   MISC NATURAL PRODUCTS (CRAN-B-OTC PO)    Take 1 capsule by mouth daily. 1 daily    OLANZAPINE (ZYPREXA) 2.5 MG TABLET    Take 1 tablet (2.5 mg total) by mouth at bedtime.   ONDANSETRON (ZOFRAN-ODT) 8 MG DISINTEGRATING TABLET    Take 1 tablet (8 mg total) by mouth every 8 (eight) hours as needed for nausea or vomiting.   POTASSIUM CHLORIDE SA (K-DUR) 20 MEQ TABLET    TAKE 2 TABLETS BY MOUTH DAILY (MAY DISSOLVE IN WATER)   PYRIDOXINE (VITAMIN B-6) 100 MG TABLET    Take 100 mg by mouth daily.   ROPINIROLE (REQUIP) 0.5 MG TABLET    Take 1 tablet (0.5 mg total) by mouth daily.   SUCRALFATE (CARAFATE) 1 GM/10ML SUSPENSION    Take 200 mg by mouth 3 (three) times daily as needed  (indigestion).    TIZANIDINE (ZANAFLEX) 2 MG TABLET    Take 2 mg by mouth 3 (three) times daily as needed for muscle spasms.    TORSEMIDE (DEMADEX) 20 MG TABLET    Take 20 mg by mouth daily.    VITAMIN B-12 (CYANOCOBALAMIN) 1000 MCG TABLET    Take 1,000 mcg by mouth daily.   Modified Medications   No medications on file  Discontinued Medications   No medications on file    Physical Exam:  There were no vitals filed for this visit. There is no height or weight on file to calculate BMI. Wt Readings from Last 3 Encounters:  04/03/19 116 lb (52.6 kg)  03/29/19 112 lb (50.8 kg)  03/29/19 113 lb (51.3 kg)      Labs reviewed: Basic Metabolic Panel: Recent Labs    01/27/19 1048  02/27/19 1745  03/06/19 1624 03/16/19 2222 03/29/19 2206  NA 142  --  142   < > 137 138 135  K 4.5  --  4.1   < > 4.3 4.5 4.2  CL 104  --  102   < > 101 103 100  CO2 29  --  29   < > 26 26 27   GLUCOSE 70  --  83   < > 93 102* 113*  BUN 25  --  30*   < > 34* 38* 32*  CREATININE 1.00*   < > 1.56*   < > 1.38* 1.13* 1.12*  CALCIUM 9.3  --  9.3   < > 8.8* 8.7* 9.1  MG  --   --  2.0  --   --   --   --   TSH 4.58*  --   --   --   --   --   --    < > =  values in this interval not displayed.   Liver Function Tests: Recent Labs    02/27/19 1745 03/06/19 1624 03/16/19 2222  AST 23 22 24   ALT 17 15 14   ALKPHOS 60 51 53  BILITOT 0.3 0.3 0.3  PROT 6.5 6.7 6.2*  ALBUMIN 3.5 3.4* 3.4*   No results for input(s): LIPASE, AMYLASE in the last 8760 hours. No results for input(s): AMMONIA in the last 8760 hours. CBC: Recent Labs    03/06/19 1119 03/06/19 1624 03/16/19 2222 03/29/19 2206  WBC 6.2 4.9 4.3 2.9*  NEUTROABS 4,123 3.4  --  1.5*  HGB 12.3 10.6* 9.9* 9.6*  HCT 37.4 33.6* 32.3* 30.2*  MCV 86.4 88.9 90.0 88.3  PLT 199 139* 159 155   Lipid Panel: No results for input(s): CHOL, HDL, LDLCALC, TRIG, CHOLHDL, LDLDIRECT in the last 8760 hours. TSH: Recent Labs    01/27/19 1048  TSH 4.58*    A1C: No results found for: HGBA1C   Assessment/Plan 1. Other chest pain -chest pain over port-a-cath that has progressively worsened. No signs of infection such has heat, redness or swelling but will follow up CBC to ensure no elevating in WBC - DG Chest 2 View- to evaluate.  - CBC with Differential/Platelet; Future - Ambulatory referral to Interventional Radiology due to progressive pain and trouble with access.   2. Drug-induced constipation -relistor not covered since she has not taken lactulose, she is very hesitant because she has so many GI issues but willing to try. Will let us know if she is unable to tolerate.  - lactulose (CHRONULAC) 10 GM/15ML solution; Take 30 mLs (20 g total) by mouth daily.  Dispense: 900 mL; Refill: 2  3. Port-A-Cath in place -has had port-a-cath for over 10 years originally last ~5 before she started having pain at the site and it needed to be replaced  - CBC with Differential/Platelet; Future - Ambulatory referral to Interventional Radiology  Next appt: 05/05/2019 as scheduled Omega Durante K. Harle Battiest  [facility] [phone]    Virtual Visit via Telephone Note  I connected with pt on 05/02/19 at  2:45 PM EDT by telephone and verified that I am speaking with the correct person using two identifiers.  Location: Patient: home Provider: office   I discussed the limitations, risks, security and privacy concerns of performing an evaluation and management service by telephone and the availability of in person appointments. I also discussed with the patient that there may be a patient responsible charge related to this service. The patient expressed understanding and agreed to proceed.   I discussed the assessment and treatment plan with the patient. The patient was provided an opportunity to ask questions and all were answered. The patient agreed with the plan and demonstrated an understanding of the instructions.   The  patient was advised to call back or seek an in-person evaluation if the symptoms worsen or if the condition fails to improve as anticipated.  I provided 15 mins minutes of non-face-to-face time during this encounter.  Carlos American. Harle Battiest Avs printed and mailed

## 2019-05-02 NOTE — Patient Instructions (Signed)
Please come to office for blood work To get chest xray to evaluate pain Referral placed to IR for ongoing recommendations and evaluation

## 2019-05-02 NOTE — Telephone Encounter (Signed)
Patient notified and stated that she HAS NOT tried the Lactulose and it is ok to send this to her pharmacy to try.   Pharmacy Confirmed with patient.

## 2019-05-02 NOTE — Telephone Encounter (Signed)
Addressed during tele-visit today

## 2019-05-04 ENCOUNTER — Ambulatory Visit: Payer: Medicare Other | Admitting: Family

## 2019-05-04 ENCOUNTER — Ambulatory Visit: Payer: Medicare Other | Admitting: Allergy and Immunology

## 2019-05-05 ENCOUNTER — Ambulatory Visit: Payer: Self-pay

## 2019-05-05 ENCOUNTER — Other Ambulatory Visit: Payer: Medicare Other

## 2019-05-09 ENCOUNTER — Ambulatory Visit: Payer: Self-pay

## 2019-05-09 ENCOUNTER — Other Ambulatory Visit: Payer: Medicare Other

## 2019-05-12 ENCOUNTER — Other Ambulatory Visit: Payer: Self-pay

## 2019-05-12 MED ORDER — DEXILANT 60 MG PO CPDR: 60.0000 mg | DELAYED_RELEASE_CAPSULE | Freq: Every day | ORAL | 3 refills | Status: DC

## 2019-05-17 ENCOUNTER — Other Ambulatory Visit: Payer: Medicare Other

## 2019-05-18 ENCOUNTER — Other Ambulatory Visit: Payer: Medicare Other

## 2019-05-18 ENCOUNTER — Ambulatory Visit: Payer: Medicare Other

## 2019-05-24 ENCOUNTER — Ambulatory Visit: Payer: Medicare Other | Admitting: Allergy and Immunology

## 2019-05-24 ENCOUNTER — Encounter: Payer: Self-pay | Admitting: Allergy and Immunology

## 2019-05-24 ENCOUNTER — Other Ambulatory Visit: Payer: Self-pay

## 2019-05-24 VITALS — BP 102/60 | HR 84 | Temp 98.4°F | Resp 20

## 2019-05-24 DIAGNOSIS — J310 Chronic rhinitis: Secondary | ICD-10-CM | POA: Diagnosis not present

## 2019-05-24 DIAGNOSIS — J0100 Acute maxillary sinusitis, unspecified: Secondary | ICD-10-CM

## 2019-05-24 DIAGNOSIS — D803 Selective deficiency of immunoglobulin G [IgG] subclasses: Secondary | ICD-10-CM | POA: Diagnosis not present

## 2019-05-24 DIAGNOSIS — J320 Chronic maxillary sinusitis: Secondary | ICD-10-CM | POA: Diagnosis not present

## 2019-05-24 MED ORDER — CARBINOXAMINE MALEATE 4 MG PO TABS: ORAL_TABLET | ORAL | 5 refills | Status: DC

## 2019-05-24 MED ORDER — AZELASTINE-FLUTICASONE 137-50 MCG/ACT NA SUSP: NASAL | 5 refills | Status: DC

## 2019-05-24 MED ORDER — METHYLPREDNISOLONE ACETATE 40 MG/ML IJ SUSP
40.0000 mg | Freq: Once | INTRAMUSCULAR | Status: AC
  Administered 2019-05-25: 40 mg via INTRAMUSCULAR

## 2019-05-24 NOTE — Patient Instructions (Addendum)
Acute maxillary sinusitis  Depo-Medrol 40 mg was administered in the office.  A prescription has been provided for azelastine/fluticasone nasal spray, 1 spray per nostril twice daily as needed.  Nasal saline spray (i.e., Simply Saline) or nasal saline lavage (i.e., NeilMed) is recommended as needed and prior to medicated nasal sprays.  A prescription has been provided for carbinoxamine 4 mg every 8 hours if needed.  The patient has been cautioned regarding the potential side effect of somnolence.  She has verbalized understanding.  The patient will contact our office if symptoms persist, progress, or if she becomes febrile.  Chronic rhinitis  Treatment plan as outlined above for acute sinusitis.  Given the recent problem with rhinorrhea (runny nose), hold guaifenesin (Mucinex) for now.  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   Return in about 5 months (around 10/22/2019), or if symptoms worsen or fail to improve.

## 2019-05-24 NOTE — Assessment & Plan Note (Signed)
   Continue Gammagard treatments as prescribed.

## 2019-05-24 NOTE — Assessment & Plan Note (Addendum)
   Depo-Medrol 40 mg was administered in the office.  A prescription has been provided for azelastine/fluticasone nasal spray, 1 spray per nostril twice daily as needed.  Nasal saline spray (i.e., Simply Saline) or nasal saline lavage (i.e., NeilMed) is recommended as needed and prior to medicated nasal sprays.  A prescription has been provided for carbinoxamine 4 mg every 8 hours if needed.  The patient has been cautioned regarding the potential side effect of somnolence.  She has verbalized understanding.  The patient will contact our office if symptoms persist, progress, or if she becomes febrile.

## 2019-05-24 NOTE — Progress Notes (Signed)
Follow-up Note  RE: Megan Brewer MRN: [id number] DOB: 09-18-1945 Date of Office Visit: 05/24/2019  Primary care provider: [name], NP Referring provider: [name], NP  History of present illness: Megan Brewer is a 73 y.o. female with immunoglobulin G deficiency and chronic rhinosinusitis presenting today for a sick visit.  She was previously seen in this clinic for her initial evaluation on Dec 28, 2018.  She reports that over the past month she has experienced rhinorrhea "like a faucet" and has been experiencing increased sinus pressure over the forehead, between the eyes, and behind the eyes.  She is currently taking azelastine nasal spray daily and guaifenesin daily.  She receives Gammagard infusions every 3 weeks with the assistance of a home health nurse.  Assessment and plan: Acute maxillary sinusitis  Depo-Medrol 40 mg was administered in the office.  A prescription has been provided for azelastine/fluticasone nasal spray, 1 spray per nostril twice daily as needed.  Nasal saline spray (i.e., Simply Saline) or nasal saline lavage (i.e., NeilMed) is recommended as needed and prior to medicated nasal sprays.  A prescription has been provided for carbinoxamine 4 mg every 8 hours if needed.  The patient has been cautioned regarding the potential side effect of somnolence.  She has verbalized understanding.  The patient will contact our office if symptoms persist, progress, or if she becomes febrile.  Chronic rhinitis  Treatment plan as outlined above for acute sinusitis.  Given the recent problem with rhinorrhea (runny nose), hold guaifenesin (Mucinex) for now.  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[place])  Continue Gammagard treatments as prescribed.   Meds ordered this encounter  Medications  . Azelastine-Fluticasone 137-50 MCG/ACT SUSP    Sig: 1 spray per nostril twice a day as needed    Dispense:  23 g    Refill:  5  . Carbinoxamine Maleate 4  MG TABS    Sig: One tablet every 8 hours if needed    Dispense:  56 tablet    Refill:  5  . methylPREDNISolone acetate (DEPO-MEDROL) injection 40 mg    Physical examination: Blood pressure 102/60, pulse 84, temperature 98.4 F (36.9 C), temperature source Oral, resp. rate 20, SpO2 97 %.  General: Alert, interactive, in no acute distress. HEENT: TMs pearly gray, turbinates moderately edematous with thick discharge, post-pharynx moderately erythematous. Neck: Supple without lymphadenopathy. Lungs: Clear to auscultation without wheezing, rhonchi or rales. CV: Normal S1, S2 without murmurs. Skin: Warm and dry, without lesions or rashes.  The following portions of the patient's history were reviewed and updated as appropriate: allergies, current medications, past family history, past medical history, past social history, past surgical history and problem list.   Current Outpatient Medications  Medication Sig Dispense Refill  . acetaminophen (TYLENOL) 650 MG CR tablet Take 1,300 mg by mouth every 8 (eight) hours as needed for pain or fever.     Marland Kitchen Apoaequorin (PREVAGEN PO) Take 1 capsule by mouth daily.     . Buprenorphine HCl-Naloxone HCl (SUBOXONE) 4-1 MG FILM Place 1 Film under the tongue daily.     . Calcium Citrate 200 MG TABS Take 1 tablet by mouth daily.     . cycloSPORINE (RESTASIS) 0.05 % ophthalmic emulsion Place 1 drop into both eyes 2 (two) times daily.    Marland Kitchen dexlansoprazole (DEXILANT) 60 MG capsule Take 1 capsule (60 mg total) by mouth daily. 30 capsule 3  . Erenumab-aooe (AIMOVIG [place]) Inject into the skin every 30 (thirty) days.     Marland Kitchen  immune globulin, human, (GAMMAGARD S/D LESS IGA) 10 g injection Inject into the vein every 21 ( twenty-one) days.     Marland Kitchen lactulose (CHRONULAC) 10 GM/15ML solution Take 30 mLs (20 g total) by mouth daily. 900 mL 2  . levothyroxine (SYNTHROID) 75 MCG tablet Take 1 tablet (75 mcg total) by mouth daily before breakfast. 90 tablet 1  . lubiprostone  (AMITIZA) 24 MCG capsule Take 24-72 mcg by mouth daily as needed for constipation.     . magnesium oxide (MAG-OX) 400 MG tablet Take 400 mg by mouth daily.     . Melatonin 5 MG CAPS Take 5 mg by mouth at bedtime as needed (sleep).     . Misc Natural Products (CRAN-B-OTC PO) Take 1 capsule by mouth daily. 1 daily     . ondansetron (ZOFRAN-ODT) 8 MG disintegrating tablet Take 1 tablet (8 mg total) by mouth every 8 (eight) hours as needed for nausea or vomiting. 90 tablet 1  . potassium chloride SA (K-DUR) 20 MEQ tablet TAKE 2 TABLETS BY MOUTH DAILY (MAY DISSOLVE IN WATER) 60 tablet 0  . pyridOXINE (VITAMIN B-6) 100 MG tablet Take 100 mg by mouth daily.    Marland Kitchen rOPINIRole (REQUIP) 0.5 MG tablet Take 1 tablet (0.5 mg total) by mouth daily. 30 tablet 1  . sucralfate (CARAFATE) 1 GM/10ML suspension Take 200 mg by mouth 3 (three) times daily as needed (indigestion).     Marland Kitchen tiZANidine (ZANAFLEX) 2 MG tablet Take 2 mg by mouth 3 (three) times daily as needed for muscle spasms.     Marland Kitchen torsemide (DEMADEX) 20 MG tablet Take 20 mg by mouth daily.     . vitamin B-12 (CYANOCOBALAMIN) 1000 MCG tablet Take 1,000 mcg by mouth daily.     . Azelastine-Fluticasone 137-50 MCG/ACT SUSP 1 spray per nostril twice a day as needed 23 g 5  . Carbinoxamine Maleate 4 MG TABS One tablet every 8 hours if needed 56 tablet 5  . OLANZapine (ZYPREXA) 2.5 MG tablet Take 1 tablet (2.5 mg total) by mouth at bedtime. 30 tablet 3   Current Facility-Administered Medications  Medication Dose Route Frequency Provider Last Rate Last Dose  . methylPREDNISolone acetate (DEPO-MEDROL) injection 40 mg  40 mg Intramuscular Once Suni Jarnagin, [place] Muta, MD        Allergies  Allergen Reactions  . Tetracycline Nausea Only    Causes ringing in ears, dizziness, migraine, nausea  . Corticosteroids Other (See Comments)    12/02/2016 interview by Melissa Montane: Oral dosing causes migraines/nausea/tinnitus. Prev tolerated IV and intranasal admin w/o difficulty.  .  Cefixime Other (See Comments)    Headache   . Ciprofloxacin Other (See Comments)    Headache, dizziness, ringing in the ears  . Doxycycline Other (See Comments)    unknown  . Gabapentin     Throat swells/closes  . Isometheptene-Dichloral-Apap Other (See Comments)    unknown  . Ketorolac     12/02/2016 interview by XHF: Oral dosing prednisone/corticosteroids causes migraines/nausea/tinnitus. Prev tolerated IV and intranasal admin w/o difficulty.  . Prednisone Other (See Comments)    Migraine, dizzy, ringing in ears-can take it IV 12/02/2016 interview by [name]: Oral prednisone dosing causes migraines/nausea/tinnitus. Prev tolerated IV and intranasal admin w/o difficulty.  . Promethazine Other (See Comments)    causes severe abdominal pain when taken IV; however she can take PO or IM  . Stadol [Butorphanol]     Cerebral pain  . Erythromycin Base Diarrhea and Itching    Severe diarrhea  .  Propoxyphene Nausea Only    Review of systems: Review of systems negative except as noted in HPI / PMHx or noted below: Constitutional: Negative.  HENT: Negative.   Eyes: Negative.  Respiratory: Negative.   Cardiovascular: Negative.  Gastrointestinal: Negative.  Genitourinary: Negative.  Musculoskeletal: Negative.  Neurological: Negative.  Endo/Heme/Allergies: Negative.  Cutaneous: Negative.   Past Medical History:  Diagnosis Date  . Arachnoiditis   . Bipolar disorder ([place])   . Chronic back pain   . Chronic pain   . Chronic post-thoracotomy pain   . CKD (chronic kidney disease)   . GERD (gastroesophageal reflux disease)   . Hypogammaglobulinemia ([place])   . Hypotension   . Hypothyroid   . 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 Lung cancer ([place]) 2011, 2014  . Memory loss   . Migraine   . Opioid abuse ([place])   . Osteoporosis   . Presence of neurostimulator   . Recurrent upper respiratory infection (URI)   . Restless legs   . Sleep apnea   . Tremor   . Urge incontinence   .  Urticaria    long time ago, nothing recent    Family History  Problem Relation Age of Onset  . Hypertension Mother   . GER disease Mother   . Dementia Mother   . Osteoporosis Mother   . Arthritis Mother   . Bipolar disorder Mother   . Parkinson's disease Father   . Congestive Heart Failure Father   . Pneumonia Father   . Arthritis Father   . Dementia Father   . Depression Father   . Asthma Sister   . Depression Sister   . Bipolar disorder Brother   . Asthma Daughter     Social History   Socioeconomic History  . Marital status: Married    Spouse name: Not on file  . Number of children: Not on file  . Years of education: Not on file  . Highest education level: Not on file  Occupational History  . Not on file  Social Needs  . Financial resource strain: Not on file  . Food insecurity    Worry: Not on file    Inability: Not on file  . Transportation needs    Medical: Not on file    Non-medical: Not on file  Tobacco Use  . Smoking status: Never Smoker  . Smokeless tobacco: Never Used  Substance and Sexual Activity  . Alcohol use: Not Currently  . Drug use: Never  . Sexual activity: Not on file  Lifestyle  . Physical activity    Days per week: Not on file    Minutes per session: Not on file  . Stress: Not on file  Relationships  . Social Herbalist on phone: Not on file    Gets together: Not on file    Attends religious service: Not on file    Active member of club or organization: Not on file    Attends meetings of clubs or organizations: Not on file    Relationship status: Not on file  . Intimate partner violence    Fear of current or ex partner: Not on file    Emotionally abused: Not on file    Physically abused: Not on file    Forced sexual activity: Not on file  Other Topics Concern  . Not on file  Social History Narrative   Diet: None      Caffeine: coffee, tea, sodas less than or 1 daily.  Married, if yes what year: Yes, 1972       Do you live in a house, apartment, assisted living, condo, trailer, ect: Two story house       Pets: None      Current/Past profession: Teach      Exercise: None         Living Will: No   DNR: No   POA/HPOA: No      Functional Status:   Do you have difficulty bathing or dressing yourself? yes   Do you have difficulty preparing food or eating? yes   Do you have difficulty managing your medications? yes   Do you have difficulty managing your finances?yes   Do you have difficulty affording your medications? no     I appreciate the opportunity to take part in Cam[place]igh's care. Please do not hesitate to contact me with questions.  Sincerely,   R. Edgar Frisk, MD

## 2019-05-24 NOTE — Assessment & Plan Note (Signed)
   Treatment plan as outlined above for acute sinusitis.  Given the recent problem with rhinorrhea (runny nose), hold guaifenesin (Mucinex) for now.

## 2019-05-25 ENCOUNTER — Telehealth: Payer: Self-pay | Admitting: *Deleted

## 2019-05-25 DIAGNOSIS — J0100 Acute maxillary sinusitis, unspecified: Secondary | ICD-10-CM | POA: Diagnosis not present

## 2019-05-25 NOTE — Telephone Encounter (Signed)
PA submitted to [employer] for Carbinoxamine 4 mg tablets- waiting for response.

## 2019-05-26 NOTE — Telephone Encounter (Signed)
PA denied for Carbinoxamine. Please advise.

## 2019-05-29 ENCOUNTER — Other Ambulatory Visit: Payer: Self-pay

## 2019-05-29 ENCOUNTER — Telehealth: Payer: Self-pay | Admitting: Psychiatry

## 2019-05-29 DIAGNOSIS — F419 Anxiety disorder, unspecified: Secondary | ICD-10-CM

## 2019-05-29 DIAGNOSIS — F99 Mental disorder, not otherwise specified: Secondary | ICD-10-CM

## 2019-05-29 DIAGNOSIS — F5105 Insomnia due to other mental disorder: Secondary | ICD-10-CM

## 2019-05-29 DIAGNOSIS — F3162 Bipolar disorder, current episode mixed, moderate: Secondary | ICD-10-CM

## 2019-05-29 MED ORDER — OLANZAPINE 2.5 MG PO TABS: 2.5000 mg | ORAL_TABLET | Freq: Every day | ORAL | 0 refills | Status: DC

## 2019-05-29 NOTE — Telephone Encounter (Signed)
Medicare is the worst. Guaifenesin 400 mg every 6 hours as needed. Thanks.

## 2019-05-29 NOTE — Telephone Encounter (Signed)
Pt informed

## 2019-05-29 NOTE — Telephone Encounter (Signed)
Spoke with patient and she dropped her bottle of olanzapine 2.5 mg and was unable to get them all back in her bottle. Her next refill isn't due till next week. Informed her I would call in a 14 day supply and she will pay out of pocket to [facility]. Pharmacist given information.

## 2019-05-29 NOTE — Telephone Encounter (Signed)
Pt lost several of her zyprexa pills due to her hands shaking when she first got them refilled. She has been out for a few days. She tried calling the pharmacy, but they would not refill them.

## 2019-05-31 ENCOUNTER — Ambulatory Visit: Payer: Medicare Other | Admitting: Gastroenterology

## 2019-05-31 ENCOUNTER — Encounter: Payer: Self-pay | Admitting: Gastroenterology

## 2019-05-31 ENCOUNTER — Telehealth: Payer: Self-pay

## 2019-05-31 ENCOUNTER — Other Ambulatory Visit (INDEPENDENT_AMBULATORY_CARE_PROVIDER_SITE_OTHER): Payer: Medicare Other

## 2019-05-31 ENCOUNTER — Encounter

## 2019-05-31 VITALS — BP 120/72 | HR 76 | Temp 98.8°F | Ht 63.0 in | Wt 120.0 lb

## 2019-05-31 DIAGNOSIS — D649 Anemia, unspecified: Secondary | ICD-10-CM

## 2019-05-31 DIAGNOSIS — K2100 Gastro-esophageal reflux disease with esophagitis, without bleeding: Secondary | ICD-10-CM

## 2019-05-31 DIAGNOSIS — K5909 Other constipation: Secondary | ICD-10-CM

## 2019-05-31 DIAGNOSIS — F1190 Opioid use, unspecified, uncomplicated: Secondary | ICD-10-CM

## 2019-05-31 DIAGNOSIS — R110 Nausea: Secondary | ICD-10-CM | POA: Diagnosis not present

## 2019-05-31 LAB — CBC WITH DIFFERENTIAL/PLATELET
Basophils Absolute: 0.1 10*3/uL (ref 0.0–0.1)
Basophils Relative: 1 % (ref 0.0–3.0)
Eosinophils Absolute: 0.1 10*3/uL (ref 0.0–0.7)
Eosinophils Relative: 1.1 % (ref 0.0–5.0)
HCT: 35.3 % — ABNORMAL LOW (ref 36.0–46.0)
Hemoglobin: 11.4 g/dL — ABNORMAL LOW (ref 12.0–15.0)
Lymphocytes Relative: 13.9 % (ref 12.0–46.0)
Lymphs Abs: 1.3 10*3/uL (ref 0.7–4.0)
MCHC: 32.4 g/dL (ref 30.0–36.0)
MCV: 83 fl (ref 78.0–100.0)
Monocytes Absolute: 0.8 10*3/uL (ref 0.1–1.0)
Monocytes Relative: 8.7 % (ref 3.0–12.0)
Neutro Abs: 7 10*3/uL (ref 1.4–7.7)
Neutrophils Relative %: 75.3 % (ref 43.0–77.0)
Platelets: 284 10*3/uL (ref 150.0–400.0)
RBC: 4.26 Mil/uL (ref 3.87–5.11)
RDW: 14 % (ref 11.5–15.5)
WBC: 9.2 10*3/uL (ref 4.0–10.5)

## 2019-05-31 LAB — IBC + FERRITIN
Ferritin: 4.9 ng/mL — ABNORMAL LOW (ref 10.0–291.0)
Iron: 24 ug/dL — ABNORMAL LOW (ref 42–145)
Saturation Ratios: 3.6 % — ABNORMAL LOW (ref 20.0–50.0)
Transferrin: 477 mg/dL — ABNORMAL HIGH (ref 212.0–360.0)

## 2019-05-31 MED ORDER — DEXILANT 60 MG PO CPDR: 60.0000 mg | DELAYED_RELEASE_CAPSULE | Freq: Every day | ORAL | 5 refills | Status: DC

## 2019-05-31 MED ORDER — FAMOTIDINE 20 MG PO TABS: 20.0000 mg | ORAL_TABLET | Freq: Two times a day (BID) | ORAL | 5 refills | Status: DC

## 2019-05-31 MED ORDER — ONDANSETRON 4 MG PO TBDP: 4.0000 mg | ORAL_TABLET | Freq: Four times a day (QID) | ORAL | 1 refills | Status: DC | PRN

## 2019-05-31 MED ORDER — LUBIPROSTONE 24 MCG PO CAPS: 24.0000 ug | ORAL_CAPSULE | Freq: Two times a day (BID) | ORAL | 5 refills | Status: DC

## 2019-05-31 NOTE — Patient Instructions (Addendum)
If you are age 71 or older, your body mass index should be between 23-30. Your Body mass index is 21.26 kg/m. If this is out of the aforementioned range listed, please consider follow up with your Primary Care Provider.  If you are age 5 or younger, your body mass index should be between 19-25. Your Body mass index is 21.26 kg/m. If this is out of the aformentioned range listed, please consider follow up with your Primary Care Provider.   Crack open your Dexilant and pour into a spoon prior to ingestion.  We have sent the following medications to your pharmacy for you to pick up at your convenience: Amitiza: Take twice a day Pepcid 20mg : Take twice a day Zofran 4mg  ODT: Take every 6 hours as needed  We will refer you to Pain Management.  They will reach out to you to schedule an appointment.  If you have not heard from them within a week or two please let us know so we can follow up with them.  Please go to the lab in the basement of our building to have lab work done as you leave today. Hit "B" for basement when you get on the elevator.  When the doors open the lab is on your left.  We will call you with the results. Thank you.  Thank you for entrusting me with your care and for choosing [facility], Dr. [place] Cellar

## 2019-05-31 NOTE — Progress Notes (Signed)
HPI :  73 year old female here for a follow-up visit.  She has multiple upper tract symptoms that we have been following her for. IgG deficiency, history of gastric bypass, chronic pain on narcotics and NSAIDs, GERD,  Since her last visit with [place] she had an EGD in June 2020.  She was found to have moderate esophagitis with adherent old heme, sharply angulated turn in the lower esophagus entering the gastric pouch.  She had a Roux-en-Y anastomosis that looked okay without any obvious ulceration.  Mild erythema of the gastric pouch for which biopsies were obtained.  The small bowel and was normal.  Biopsies for H. pylori were normal.   We switch her PPI to Dexilant 60 mg a day and added Carafate as needed.  She states that initially has helped her and that specifically has definitely provided benefit to her heartburn and she no longer has much of that.  Her main complaint today is ongoing nausea.  She is not vomiting at all but frequently feels nauseated.  She also has been having some lower chest pains/epigastric spasms which bothers her at times.  After she eats she has a sense of fullness in her stomach and has decreased appetite.  She she has actually gained a few pounds of weight since have last seen her.  She has been seen by a cardiologist for her chest pain, she had a negative stress test in August 2020.  Previously was taking Goody powders frequently and has since stopped them all.  She is not using any NSAIDs.  She does use chronic Suboxone for chronic pain.  She has been hoping to get off chronic pain medicines and does not like the way they make her feel, she has been frustrated by her pain management who has been hesitant to wean her off.  She is looking for another opinion by another pain management physician.  She has had 2 CT scans of her chest, one of them a CT angio in July for her chest pain which have not shown a cause.  She had a CT of her abdomen and pelvis in March of this year.  She  reports the Amitiza is working well for her bowels, her constipation has not been bothering her too much.  Her last colonoscopy was in 2015 she a good prep and hyperplastic polyps reported.  She previously had taken some Zofran for her nausea but is no longer taking it.  Of note her last few CBCs have showed anemia which is new for her in recent months.  Prior workup: CT scan chest 10/25/18 - no evidence of mass lesions, h/o cholecystectomy. Her lung cancer is reportedly in remission.   CT angio 02/27/19 - no PE or new changes.   CT abdomen and pelvis 10/25/18 [facility]) - no new or concerning changes.   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[place])    Lung cancer ([place]) 2011, 2014  Memory loss    Migraine    Opioid abuse (H[facility])    Osteoporosis    Presence of neurostimulator    Recurrent upper respiratory infection (URI)    Restless legs    Sleep apnea    Tremor    Urge incontinence    Urticaria    long time ago, nothing recent     Past Surgical History:  Procedure Laterality Date   ABDOMINAL HYSTERECTOMY     CARPAL TUNNEL RELEASE     CATARACT EXTRACTION Bilateral 2019   CHOLECYSTECTOMY     COLONOSCOPY  2015   [facility]   CT LUNG SCREENING  2018   DG  BONE DENSITY ([place] [facility])  2018   DIAGNOSTIC MAMMOGRAM  2019   GASTRIC BYPASS     LUNG CANCER SURGERY  02/2010   SPINAL CORD STIMULATOR IMPLANT     Family History  Problem Relation Age of  Onset   Hypertension Mother    GER disease Mother    Dementia Mother    Osteoporosis Mother    Arthritis Mother    Bipolar disorder Mother    Parkinson's disease Father    Congestive Heart Failure Father    Pneumonia Father    Arthritis Father    Dementia Father    Depression Father    Asthma Sister    Depression Sister    Bipolar disorder Brother    Asthma Daughter    Social History   Tobacco Use   Smoking status: Never Smoker   Smokeless tobacco: Never Used  Substance Use Topics   Alcohol use: Not Currently   Drug use: Never   Current Outpatient Medications  Medication Sig Dispense Refill   acetaminophen (TYLENOL) 650 MG CR tablet Take 1,300 mg by mouth every 8 (eight) hours as needed for pain or fever.      Apoaequorin (PREVAGEN PO) Take 1 capsule by mouth daily.      Azelastine-Fluticasone 137-50 MCG/ACT SUSP 1 spray per nostril twice a day as needed 23 g 5   Buprenorphine HCl-Naloxone HCl (SUBOXONE) 4-1 MG FILM Place 1 Film under the tongue daily.      Calcium Citrate 200 MG TABS Take 1 tablet by mouth daily.      Carbinoxamine Maleate 4 MG TABS One tablet every 8 hours if needed 56 tablet 5   cycloSPORINE (RESTASIS) 0.05 % ophthalmic emulsion Place 1 drop into both eyes 2 (two) times daily.     dexlansoprazole (DEXILANT) 60 MG capsule Take 1 capsule (60 mg total) by mouth daily. 30 capsule 3   Erenumab-aooe (AIMOVIG [place]) Inject into the skin every 30 (thirty) days.      immune globulin, human, (GAMMAGARD S/D LESS IGA) 10 g injection Inject into the vein every 21 ( twenty-one) days.      lactulose (CHRONULAC) 10 GM/15ML solution Take 30 mLs (20 g total) by mouth daily. 900 mL 2   levothyroxine (SYNTHROID) 75 MCG tablet Take 1 tablet (75 mcg total) by mouth daily before breakfast. 90 tablet 1   lubiprostone (AMITIZA) 24 MCG capsule Take 24-72 mcg by mouth daily as needed for constipation.      magnesium oxide (MAG-OX) 400 MG tablet Take  400 mg by mouth daily.      Melatonin 5 MG CAPS Take 5 mg by mouth at bedtime as needed (sleep).      Misc Natural Products (CRAN-B-OTC PO) Take 1 capsule by mouth daily. 1 daily      OLANZapine (ZYPREXA) 2.5 MG  tablet Take 1 tablet (2.5 mg total) by mouth at bedtime for 14 days. 14 tablet 0   potassium chloride SA (K-DUR) 20 MEQ tablet TAKE 2 TABLETS BY MOUTH DAILY (MAY DISSOLVE IN WATER) 60 tablet 0   pyridOXINE (VITAMIN B-6) 100 MG tablet Take 100 mg by mouth daily.     rOPINIRole (REQUIP) 0.5 MG tablet Take 1 tablet (0.5 mg total) by mouth daily. 30 tablet 1   sucralfate (CARAFATE) 1 GM/10ML suspension Take 200 mg by mouth 3 (three) times daily as needed (indigestion).      tiZANidine (ZANAFLEX) 2 MG tablet Take 2 mg by mouth 3 (three) times daily as needed for muscle spasms.      torsemide (DEMADEX) 20 MG tablet Take 20 mg by mouth daily.      vitamin B-12 (CYANOCOBALAMIN) 1000 MCG tablet Take 1,000 mcg by mouth daily.      No current facility-administered medications for this visit.    Allergies  Allergen Reactions   Tetracycline Nausea Only    Causes ringing in ears, dizziness, migraine, nausea   Corticosteroids Other (See Comments)    12/02/2016 interview by WGN: Oral dosing causes migraines/nausea/tinnitus. Prev tolerated IV and intranasal admin w/o difficulty.   Cefixime Other (See Comments)    Headache    Ciprofloxacin Other (See Comments)    Headache, dizziness, ringing in the ears   Doxycycline Other (See Comments)    unknown   Gabapentin     Throat swells/closes   Isometheptene-Dichloral-Apap Other (See Comments)    unknown   Ketorolac     12/02/2016 interview by FAO: Oral dosing prednisone/corticosteroids causes migraines/nausea/tinnitus. Prev tolerated IV and intranasal admin w/o difficulty.   Prednisone Other (See Comments)    Migraine, dizzy, ringing in ears-can take it IV 12/02/2016 interview by [name]: Oral prednisone dosing causes  migraines/nausea/tinnitus. Prev tolerated IV and intranasal admin w/o difficulty.   Promethazine Other (See Comments)    causes severe abdominal pain when taken IV; however she can take PO or IM   Stadol [Butorphanol]     Cerebral pain   Erythromycin Base Diarrhea and Itching    Severe diarrhea   Propoxyphene Nausea Only     Review of Systems: All systems reviewed and negative except where noted in HPI.   Lab Results  Component Value Date   WBC 9.2 05/31/2019   HGB 11.4 (L) 05/31/2019   HCT 35.3 (L) 05/31/2019   MCV 83.0 05/31/2019   PLT 284.0 05/31/2019    Lab Results  Component Value Date   CREATININE 1.12 (H) 03/29/2019   BUN 32 (H) 03/29/2019   NA 135 03/29/2019   K 4.2 03/29/2019   CL 100 03/29/2019   CO2 27 03/29/2019    Lab Results  Component Value Date   ALT 14 03/16/2019   AST 24 03/16/2019   ALKPHOS 53 03/16/2019   BILITOT 0.3 03/16/2019     Physical Exam: BP 120/72    Pulse 76    Temp 98.8 F (37.1 C)    Ht 5\' 3"  (1.6 m)    Wt 120 lb (54.4 kg)    BMI 21.26 kg/m  Constitutional: Pleasant, thin appearing female in no acute distress. HEENT: Normocephalic and atraumatic. Conjunctivae are normal. No scleral icterus. Neck supple.  Cardiovascular: Normal rate, regular rhythm.  Pulmonary/chest: Effort normal and breath sounds normal. No wheezing, rales or rhonchi. Abdominal: Soft, nondistended, nontender. There are no masses palpable. No hepatomegaly. Extremities: no edema Lymphadenopathy: No cervical adenopathy noted. Neurological: Alert  and oriented to person place and time. Skin: Skin is warm and dry. No rashes noted. Psychiatric: Normal mood and affect. Behavior is normal.   ASSESSMENT AND PLAN: 73 year old female here for reassessment of the following:  GERD / chronic nausea / chronic narcotic use / chest and epigastric pain - negative cardiac work-up, negative imaging with CT scan of the chest and abdomen x2.  EGD as above.  On Dexilant her  heartburn and reflux symptoms are considerably improved, main issue now is the chronic nausea and periodic discomfort after she eats.  She could have more of a dyspepsia issue at this time although with her chest pain is possible her reflux is driving this, although again her more typical reflux symptoms have been better on the [place].  I recommend that she crack open the Dexilant capsule prior to ingestion to maximize absorption.  I will add Pepcid 20 mg twice daily to see if that helps.  I will add Zofran 4 mg ODT to take every 6 hours as needed to treat her nausea.  Further, she is on chronic narcotics which is confounding this presentation.  She is motivated to come off the narcotics and I agree with her that would be a good idea.  She is frustrated with her current pain management physician, I will refer her to local pain management practice to try to get her off chronic narcotics, I think this will help her stomach feel better.  Agree with the plan, asked her to contact me in a few weeks if he is not getting better.  If symptoms persist despite this, may need to consider a pH test to see if she is having breakthrough reflux driving the symptoms.  Anemia - she has had a downtrending hemoglobin in recent months, will repeat CBC as well as iron studies with further recommendations.   Constipation - we will refill Amitiza seems to be working well for her.  Her last colonoscopy is up-to-date based on results of hyperplastic polyps at [facility], however will await her anemia work-up, if iron deficient may etc. colonoscopy, although this could also be due to post gastric bypass state.  [address] Cellar, MD [facility]

## 2019-05-31 NOTE — Telephone Encounter (Signed)
Pt referred to [facility], Dr. Cleon Gustin to wean off narcotics. Phone: [phone]  Fax:  [phone]. Address: [address] [place], [place]

## 2019-06-01 ENCOUNTER — Telehealth: Payer: Self-pay | Admitting: Gastroenterology

## 2019-06-01 NOTE — Telephone Encounter (Signed)
Called patient and scheduled colonoscopy in [place] on 06/29/19 with Pre-visit on 06/21/19

## 2019-06-05 NOTE — Telephone Encounter (Signed)
Patient is scheduled for an appt on 06-20-19 at 2:00pm with Dr. Catheryn Bacon at [facility].

## 2019-06-05 NOTE — Telephone Encounter (Signed)
Great thanks Jan 

## 2019-06-08 ENCOUNTER — Telehealth: Payer: Self-pay | Admitting: Psychiatry

## 2019-06-08 NOTE — Telephone Encounter (Signed)
error 

## 2019-06-21 ENCOUNTER — Telehealth: Payer: Self-pay | Admitting: *Deleted

## 2019-06-21 ENCOUNTER — Ambulatory Visit (AMBULATORY_SURGERY_CENTER): Payer: Self-pay | Admitting: *Deleted

## 2019-06-21 DIAGNOSIS — D509 Iron deficiency anemia, unspecified: Secondary | ICD-10-CM

## 2019-06-21 NOTE — Telephone Encounter (Signed)
Dr Havery Moros,  can you please instructed me on a Dulcolax Supp/ miralax purge- We don't use that in [facility] and I want to make sure its the correct dose/ usage of the meds  Thanks,Marie

## 2019-06-21 NOTE — Telephone Encounter (Signed)
Thanks for the update. Colonoscopy was recommended given her severe iron deficiency and relatively recent EGD that was already done.  If she is declining the colonoscopy then I would like to see her back in clinic to discuss the issue and see how she wishes to proceed.  She had stated Amitiza worked well for her previously during clinic visit. If that is no longer the case and she does not tolerate it then would recommend Miralax twice daily at baseline. She may need to due a purge with Miralax and dulcolax suppository if she has not had a BM in a week.

## 2019-06-21 NOTE — Telephone Encounter (Signed)
She should start with a dulcolax suppository or enema OTC initially to see if she can find some relief. I would then mix 1/2 of a 238gm bottle of Miralax with 32 oz Gatorade and drink, that should help clean her out. Thanks

## 2019-06-21 NOTE — Telephone Encounter (Signed)
Called pt and we discussed instructions per Dr Havery Moros-   Pt instructed to not refill Amitiza Gave her Miralax purge instructions and then after to start BID Miralax  OV 12/16  WED at 34 am With Dr Havery Moros Pt instructed to call with increased issues or concerns before OV Pt verbalized understanding of all instructions given today

## 2019-06-21 NOTE — Telephone Encounter (Signed)
Pt came into [facility] today- she has a colon scheduled for 11-12 Thursday - states she does not want to have a colonoscopy at this time-  She states she was given an antibiotic since her OV with you 05-31-2019 and her issues have almost completely gone away- she states her anemia is better as she feels better but no recent labs to verify , she is on Iron daily, she can eat you under the table she said and she wishes to wait on the procedure- she states she is having dehydration issues and with the prep she thinks she will have more issues and she does not currently want to do that.   She did stop the Amitiza for constipation for reasons of it was "hard on her"-- and she has been almost 1 week with out a BM and feels better off this medicine- she is asking for help with constipation- she is not using any OTC remedies for constipation but she said she thought you wanted her off the Amitiza- she states it is hard on her and was causing the pain she was having- she said once off Amitiza, her pain has almost completely gone away and the antibiotic took care of the rest of the pain and issues -  I was not sure if you wanted her to have another OV with you to discuss This further or if you wanted to advise and me call her.  She does NOT want to go back on the Amitiza but is very concerned she has not had a BM in 1 week and states she needs help   Her last colon in 2015 at [facility]  she had HPP and she had an EGD with you 01-2019 with esophagitis- she is on Dexilant and Uses Carafate PRN   Please advise on a plan,  Thanks for your time, Marijean Niemann

## 2019-06-23 ENCOUNTER — Ambulatory Visit (INDEPENDENT_AMBULATORY_CARE_PROVIDER_SITE_OTHER): Payer: Medicare Other | Admitting: Psychiatry

## 2019-06-23 ENCOUNTER — Encounter: Payer: Self-pay | Admitting: Psychiatry

## 2019-06-23 ENCOUNTER — Other Ambulatory Visit: Payer: Self-pay

## 2019-06-23 DIAGNOSIS — F3162 Bipolar disorder, current episode mixed, moderate: Secondary | ICD-10-CM

## 2019-06-23 DIAGNOSIS — F5105 Insomnia due to other mental disorder: Secondary | ICD-10-CM | POA: Diagnosis not present

## 2019-06-23 DIAGNOSIS — F99 Mental disorder, not otherwise specified: Secondary | ICD-10-CM | POA: Diagnosis not present

## 2019-06-23 DIAGNOSIS — F419 Anxiety disorder, unspecified: Secondary | ICD-10-CM | POA: Diagnosis not present

## 2019-06-23 MED ORDER — OLANZAPINE 5 MG PO TABS: 5.0000 mg | ORAL_TABLET | Freq: Every day | ORAL | 0 refills | Status: DC

## 2019-06-23 NOTE — Progress Notes (Signed)
   06/23/19 1214  Facial and Oral Movements  Muscles of Facial Expression 0  Lips and Perioral Area 0  Jaw 0  Tongue 0  Extremity Movements  Upper (arms, wrists, hands, fingers) 0  Lower (legs, knees, ankles, toes) 0  Trunk Movements  Neck, shoulders, hips 0  Overall Severity  Severity of abnormal movements (highest score from questions above) 0  Incapacitation due to abnormal movements 0  Patient's awareness of abnormal movements (rate only patient's report) 0  AIMS Total Score  AIMS Total Score 0

## 2019-06-23 NOTE — Progress Notes (Signed)
Megan Brewer [id number] October 17, 1945 73 y.o.  Subjective:   Patient ID:  Megan Brewer is a 73 y.o. (DOB Jul 11, 1946) female.  Chief Complaint:  Chief Complaint  Patient presents with  . Anxiety  . Depression  . Insomnia    HPI Megan Brewer presents to the office today for follow-up of anxiety, depression, insomnia. Pt reports that shortly after her last visit she noticed worsening mood and anxiety in both severity and frequency. She reports that she has been experiencing persistent depression. She reports "I don't want to do anything. I just want to sit in the corner." Reports that she is having moments of acute anxiety where she will shake and has difficulty thinking through things. Reports energy and motivation have been lower. She reports that she has had increased difficulty controlling her irritability. Reports worsening in mood lability since last visit. Has not impulsively shopped after throwing away her catalogs. Reports anhedonia. Has been socially withdrawn. Appetite has been good. Reports that it has been more challenging to dismiss suicidal thoughts. Denies suicidal intent or plan.   She reports that she was prescribed Trileptal to help wean her off of Suboxone at her request. She reports that Trileptal seems to help with her pain. She reports that she has started Trileptal 150 mg po QHS. She reports that slept better after starting Trileptal.  She reports that she is no longer coloring.   Past Psychiatric Medication Trials: Sertraline- Was effective for about 25 years and no longer seems to be as effective.  Prozac Paxil  Cymbalta- may have caused psychosis. Depakote Gabapentin Lithium- Edema. Minimally effective Seroquel Risperidone Zyprexa Melatonin Ativan- Reports taking until 2 months ago.  Valium- "great for my depression." Xanax Trazodone   Review of Systems:  Review of Systems  HENT: Positive for trouble swallowing.   Musculoskeletal:  Negative for gait problem.  Skin:       Very dry skin.   Neurological: Positive for tremors.  Psychiatric/Behavioral:       Please refer to HPI    Medications: I have reviewed the patient's current medications.  Current Outpatient Medications  Medication Sig Dispense Refill  . acetaminophen (TYLENOL) 650 MG CR tablet Take 1,300 mg by mouth every 8 (eight) hours as needed for pain or fever.     Marland Kitchen Apoaequorin (PREVAGEN PO) Take 1 capsule by mouth daily.     Marland Kitchen azelastine (ASTELIN) 0.1 % nasal spray SMARTSIG:1 Spray(s) Both Nares 1 to 2 Times Daily PRN    . Azelastine-Fluticasone 137-50 MCG/ACT SUSP 1 spray per nostril twice a day as needed 23 g 5  . Buprenorphine HCl-Naloxone HCl (SUBOXONE) 4-1 MG FILM Place 1 Film under the tongue daily.     . Calcium Citrate 200 MG TABS Take 1 tablet by mouth daily.     Marland Kitchen dexlansoprazole (DEXILANT) 60 MG capsule Take 1 capsule (60 mg total) by mouth daily. Crack open the capsule and pour into spoon prior to ingestion 30 capsule 5  . Erenumab-aooe (AIMOVIG [place]) Inject into the skin every 30 (thirty) days.     . ferrous sulfate 325 (65 FE) MG tablet Take 325 mg by mouth 2 (two) times daily with a meal.    . levothyroxine (SYNTHROID) 75 MCG tablet Take 1 tablet (75 mcg total) by mouth daily before breakfast. 90 tablet 1  . magnesium oxide (MAG-OX) 400 MG tablet Take 400 mg by mouth daily.     . OXcarbazepine (TRILEPTAL) 150 MG tablet Take 150 mg by  mouth daily. Will increase to BID after 5 days, then increase to TID after 5 days    . potassium chloride SA (K-DUR) 20 MEQ tablet TAKE 2 TABLETS BY MOUTH DAILY (MAY DISSOLVE IN WATER) 60 tablet 0  . rOPINIRole (REQUIP) 0.5 MG tablet Take 1 tablet (0.5 mg total) by mouth daily. 30 tablet 1  . sucralfate (CARAFATE) 1 GM/10ML suspension Take 200 mg by mouth 3 (three) times daily as needed (indigestion).     Marland Kitchen tiZANidine (ZANAFLEX) 2 MG tablet Take 2 mg by mouth 3 (three) times daily as needed for muscle spasms.     Marland Kitchen  torsemide (DEMADEX) 20 MG tablet Take 20 mg by mouth daily.     . Carbinoxamine Maleate 4 MG TABS One tablet every 8 hours if needed 56 tablet 5  . cycloSPORINE (RESTASIS) 0.05 % ophthalmic emulsion Place 1 drop into both eyes 2 (two) times daily.    . famotidine (PEPCID) 20 MG tablet Take 1 tablet (20 mg total) by mouth 2 (two) times daily. (Patient not taking: Reported on 06/23/2019) 60 tablet 5  . immune globulin, human, (GAMMAGARD S/D LESS IGA) 10 g injection Inject into the vein every 21 ( twenty-one) days.     Marland Kitchen lactulose (CHRONULAC) 10 GM/15ML solution Take 30 mLs (20 g total) by mouth daily. (Patient not taking: Reported on 06/23/2019) 900 mL 2  . LASTACAFT 0.25 % SOLN Apply 1 drop to eye daily.    Marland Kitchen lubiprostone (AMITIZA) 24 MCG capsule Take 1 capsule (24 mcg total) by mouth 2 (two) times daily. (Patient not taking: Reported on 06/23/2019) 60 capsule 5  . Melatonin 5 MG CAPS Take 5 mg by mouth at bedtime as needed (sleep).     . Misc Natural Products (CRAN-B-OTC PO) Take 1 capsule by mouth daily. 1 daily     . OLANZapine (ZYPREXA) 5 MG tablet Take 1 tablet (5 mg total) by mouth at bedtime. 30 tablet 0  . ondansetron (ZOFRAN ODT) 4 MG disintegrating tablet Take 1 tablet (4 mg total) by mouth every 6 (six) hours as needed for nausea. (Patient not taking: Reported on 06/23/2019) 90 tablet 1  . pyridOXINE (VITAMIN B-6) 100 MG tablet Take 100 mg by mouth daily.    . vitamin B-12 (CYANOCOBALAMIN) 1000 MCG tablet Take 1,000 mcg by mouth daily.      No current facility-administered medications for this visit.     Medication Side Effects: None  Allergies:  Allergies  Allergen Reactions  . Tetracycline Nausea Only    Causes ringing in ears, dizziness, migraine, nausea  . Corticosteroids Other (See Comments)    12/02/2016 interview by Melissa Montane: Oral dosing causes migraines/nausea/tinnitus. Prev tolerated IV and intranasal admin w/o difficulty.  . Cefixime Other (See Comments)    Headache   .  Ciprofloxacin Other (See Comments)    Headache, dizziness, ringing in the ears  . Doxycycline Other (See Comments)    unknown  . Gabapentin     Throat swells/closes  . Isometheptene-Dichloral-Apap Other (See Comments)    unknown  . Ketorolac     12/02/2016 interview by [facility]: Oral dosing prednisone/corticosteroids causes migraines/nausea/tinnitus. Prev tolerated IV and intranasal admin w/o difficulty.  . Prednisone Other (See Comments)    Migraine, dizzy, ringing in ears-can take it IV 12/02/2016 interview by [name]: Oral prednisone dosing causes migraines/nausea/tinnitus. Prev tolerated IV and intranasal admin w/o difficulty.  . Promethazine Other (See Comments)    causes severe abdominal pain when taken IV; however she can take  PO or IM  . Stadol [Butorphanol]     Cerebral pain  . Erythromycin Base Diarrhea and Itching    Severe diarrhea  . Propoxyphene Nausea Only    Past Medical History:  Diagnosis Date  . Arachnoiditis   . Bipolar disorder ([place])   . Chronic back pain   . Chronic pain   . Chronic post-thoracotomy pain   . CKD (chronic kidney disease)   . GERD (gastroesophageal reflux disease)   . Hypogammaglobulinemia ([place])   . Hypotension   . Hypothyroid   . 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 Lung cancer ([place]) 2011, 2014  . Memory loss   . Migraine   . Opioid abuse ([place])   . Osteoporosis   . Presence of neurostimulator   . Recurrent upper respiratory infection (URI)   . Restless legs   . Sleep apnea   . Tremor   . Urge incontinence   . Urticaria    long time ago, nothing recent    Family History  Problem Relation Age of Onset  . Hypertension Mother   . GER disease Mother   . Dementia Mother   . Osteoporosis Mother   . Arthritis Mother   . Bipolar disorder Mother   . Parkinson's disease Father   . Congestive Heart Failure Father   . Pneumonia Father   . Arthritis Father   . Dementia Father   . Depression Father   . Asthma Sister   . Depression  Sister   . Bipolar disorder Brother   . Asthma Daughter     Social History   Socioeconomic History  . Marital status: Married    Spouse name: Not on file  . Number of children: Not on file  . Years of education: Not on file  . Highest education level: Not on file  Occupational History  . Not on file  Social Needs  . Financial resource strain: Not on file  . Food insecurity    Worry: Not on file    Inability: Not on file  . Transportation needs    Medical: Not on file    Non-medical: Not on file  Tobacco Use  . Smoking status: Never Smoker  . Smokeless tobacco: Never Used  Substance and Sexual Activity  . Alcohol use: Not Currently  . Drug use: Never  . Sexual activity: Not on file  Lifestyle  . Physical activity    Days per week: Not on file    Minutes per session: Not on file  . Stress: Not on file  Relationships  . Social Herbalist on phone: Not on file    Gets together: Not on file    Attends religious service: Not on file    Active member of club or organization: Not on file    Attends meetings of clubs or organizations: Not on file    Relationship status: Not on file  . Intimate partner violence    Fear of current or ex partner: Not on file    Emotionally abused: Not on file    Physically abused: Not on file    Forced sexual activity: Not on file  Other Topics Concern  . Not on file  Social History Narrative   Diet: None      Caffeine: coffee, tea, sodas less than or 1 daily.      Married, if yes what year: Yes, 1972      Do you live in a house, apartment, assisted living,  condo, trailer, ect: Two story house       Pets: None      Current/Past profession: Teach      Exercise: None         Living Will: No   DNR: No   POA/HPOA: No      Functional Status:   Do you have difficulty bathing or dressing yourself? yes   Do you have difficulty preparing food or eating? yes   Do you have difficulty managing your medications? yes   Do you  have difficulty managing your finances?yes   Do you have difficulty affording your medications? no    Past Medical History, Surgical history, Social history, and Family history were reviewed and updated as appropriate.   Please see review of systems for further details on the patient's review from today.   Objective:   Physical Exam:  There were no vitals taken for this visit.  Physical Exam Constitutional:      General: She is not in acute distress.    Appearance: She is well-developed.  Musculoskeletal:        General: No deformity.  Neurological:     Mental Status: She is alert and oriented to person, place, and time.     Coordination: Coordination normal.  Psychiatric:        Attention and Perception: Attention and perception normal. She does not perceive auditory or visual hallucinations.        Mood and Affect: Mood is anxious and depressed. Affect is blunt. Affect is not labile, angry or inappropriate.        Speech: Speech normal.        Behavior: Behavior normal. Behavior is cooperative.        Thought Content: Thought content normal. Thought content is not paranoid or delusional. Thought content does not include homicidal or suicidal ideation. Thought content does not include homicidal or suicidal plan.        Cognition and Memory: Cognition and memory normal.        Judgment: Judgment normal.     Comments: Insight intact. No delusions.      Lab Review:     Component Value Date/Time   NA 135 03/29/2019 2206   K 4.2 03/29/2019 2206   CL 100 03/29/2019 2206   CO2 27 03/29/2019 2206   GLUCOSE 113 (H) 03/29/2019 2206   BUN 32 (H) 03/29/2019 2206   CREATININE 1.12 (H) 03/29/2019 2206   CREATININE 1.40 (H) 03/06/2019 1119   CALCIUM 9.1 03/29/2019 2206   PROT 6.2 (L) 03/16/2019 2222   ALBUMIN 3.4 (L) 03/16/2019 2222   AST 24 03/16/2019 2222   ALT 14 03/16/2019 2222   ALKPHOS 53 03/16/2019 2222   BILITOT 0.3 03/16/2019 2222   GFRNONAA 50 (L) 03/29/2019 2206    GFRNONAA 38 (L) 03/06/2019 1119   GFRAA 58 (L) 03/29/2019 2206   GFRAA 44 (L) 03/06/2019 1119       Component Value Date/Time   WBC 9.2 05/31/2019 1157   RBC 4.26 05/31/2019 1157   HGB 11.4 (L) 05/31/2019 1157   HCT 35.3 (L) 05/31/2019 1157   PLT 284.0 05/31/2019 1157   MCV 83.0 05/31/2019 1157   MCH 28.1 03/29/2019 2206   MCHC 32.4 05/31/2019 1157   RDW 14.0 05/31/2019 1157   LYMPHSABS 1.3 05/31/2019 1157   MONOABS 0.8 05/31/2019 1157   EOSABS 0.1 05/31/2019 1157   BASOSABS 0.1 05/31/2019 1157    No results found for: POCLITH, LITHIUM   No  results found for: PHENYTOIN, PHENOBARB, VALPROATE, CBMZ   .res Assessment: Plan:   Patient seen for 30 minutes and greater than 50% of visit spent counseling patient and coordination of care to include discussing recent addition of Trileptal by medical provider to help minimize discontinuation signs and symptoms with planned dose reductions of Suboxone.  Discussed that Trileptal can potentially be effective for anxiety, mood, and insomnia as well.  Answered patient's questions regarding potential benefits, risks, and side effects of Trileptal.  Patient reports that she plans to continue Trileptal.  Discussed option of increasing olanzapine since she initially noticed significant improvement with olanzapine and discussed option of also waiting until response to Trileptal is known.  Patient request to start an increase in olanzapine immediately due to severity of mood and anxiety signs and symptoms.  Will increase Zyprexa to 5 mg at bedtime for anxiety, mood stabilization, and insomnia. Patient to follow-up with this provider in 4 weeks or sooner if clinically indicated. Patient advised to contact office with any questions, adverse effects, or acute worsening in signs and symptoms.  Zierra was seen today for anxiety, depression and insomnia.  Diagnoses and all orders for this visit:  Bipolar disorder, current episode mixed, moderate (HCC) -      OLANZapine (ZYPREXA) 5 MG tablet; Take 1 tablet (5 mg total) by mouth at bedtime.  Anxiety disorder, unspecified type -     OLANZapine (ZYPREXA) 5 MG tablet; Take 1 tablet (5 mg total) by mouth at bedtime.  Insomnia due to other mental disorder -     OLANZapine (ZYPREXA) 5 MG tablet; Take 1 tablet (5 mg total) by mouth at bedtime.     Please see After Visit Summary for patient specific instructions.  Future Appointments  Date Time Provider [place]  07/05/2019  2:15 PM [name], NP [facility] None  07/21/2019  1:00 PM Thayer Headings, PMHNP [facility] None  08/02/2019 11:00 AM Armbruster, Carlota Raspberry, MD [facility]    No orders of the defined types were placed in this encounter.   -------------------------------

## 2019-06-29 ENCOUNTER — Encounter: Payer: Medicare Other | Admitting: Gastroenterology

## 2019-07-04 ENCOUNTER — Ambulatory Visit: Payer: Medicare Other | Admitting: Nurse Practitioner

## 2019-07-05 ENCOUNTER — Ambulatory Visit (INDEPENDENT_AMBULATORY_CARE_PROVIDER_SITE_OTHER): Payer: Medicare Other | Admitting: Nurse Practitioner

## 2019-07-05 ENCOUNTER — Other Ambulatory Visit: Payer: Self-pay

## 2019-07-05 ENCOUNTER — Encounter: Payer: Self-pay | Admitting: Nurse Practitioner

## 2019-07-05 DIAGNOSIS — K209 Esophagitis, unspecified without bleeding: Secondary | ICD-10-CM

## 2019-07-05 DIAGNOSIS — R531 Weakness: Secondary | ICD-10-CM

## 2019-07-05 DIAGNOSIS — M810 Age-related osteoporosis without current pathological fracture: Secondary | ICD-10-CM

## 2019-07-05 DIAGNOSIS — R634 Abnormal weight loss: Secondary | ICD-10-CM

## 2019-07-05 DIAGNOSIS — K5903 Drug induced constipation: Secondary | ICD-10-CM

## 2019-07-05 DIAGNOSIS — G43919 Migraine, unspecified, intractable, without status migrainosus: Secondary | ICD-10-CM

## 2019-07-05 DIAGNOSIS — N390 Urinary tract infection, site not specified: Secondary | ICD-10-CM

## 2019-07-05 DIAGNOSIS — F329 Major depressive disorder, single episode, unspecified: Secondary | ICD-10-CM | POA: Diagnosis not present

## 2019-07-05 DIAGNOSIS — E039 Hypothyroidism, unspecified: Secondary | ICD-10-CM

## 2019-07-05 DIAGNOSIS — G2581 Restless legs syndrome: Secondary | ICD-10-CM

## 2019-07-05 NOTE — Progress Notes (Signed)
This service is provided via telemedicine  No vital signs collected/recorded due to the encounter was a telemedicine visit.   Location of patient (ex: home, work):  Home  Patient consents to a telephone visit:  Yes  Location of the provider (ex: office, home):  Office  Name of any referring provider:  N/A  Names of all persons participating in the telemedicine service and their role in the encounter: Marisa Cyphers RMA, Sherrie Mustache NP, Alma Downs Patient  Time spent on call:  10 min.     Careteam: Patient Care Team: [name], NP as PCP - General (Geriatric Medicine)  Advanced Directive information Does Patient Have a Medical Advance Directive?: No  Allergies  Allergen Reactions  . Tetracycline Nausea Only    Causes ringing in ears, dizziness, migraine, nausea  . Corticosteroids Other (See Comments)    12/02/2016 interview by Melissa Montane: Oral dosing causes migraines/nausea/tinnitus. Prev tolerated IV and intranasal admin w/o difficulty.  . Cefixime Other (See Comments)    Headache   . Ciprofloxacin Other (See Comments)    Headache, dizziness, ringing in the ears  . Doxycycline Other (See Comments)    unknown  . Gabapentin     Throat swells/closes  . Isometheptene-Dichloral-Apap Other (See Comments)    unknown  . Ketorolac     12/02/2016 interview by WRU: Oral dosing prednisone/corticosteroids causes migraines/nausea/tinnitus. Prev tolerated IV and intranasal admin w/o difficulty.  . Prednisone Other (See Comments)    Migraine, dizzy, ringing in ears-can take it IV 12/02/2016 interview by [name]: Oral prednisone dosing causes migraines/nausea/tinnitus. Prev tolerated IV and intranasal admin w/o difficulty.  . Promethazine Other (See Comments)    causes severe abdominal pain when taken IV; however she can take PO or IM  . Stadol [Butorphanol]     Cerebral pain  . Erythromycin Base Diarrhea and Itching    Severe diarrhea  . Propoxyphene Nausea Only    Chief  Complaint  Patient presents with  . Medical Management of Chronic Issues    3 Month Follow Up     HPI: Patient is a 73 y.o. female for routine follow up.  Pt could not connect virtually therefore telephone visit was done.   Weight is doing better- up to about 120 lbs. Able to eat better without abdominal pain. Continues to follow up with GI, she was treated for esophagitis and nausea has improved.   Migraine- doing amovig injections monthly. Doing much better.   Osteoporosis- she is due for prolia, thought rheumatology would follow up but they said they would not. Needs [place] to set her up for prolia at this time.   Depression- followed by psych, Zyprexa which has significantly helped mood. Continues to have depression, reports this is moderate, "does not know how to judge it"   Chronic pain followed by pain management, continues on suboxone.   Malignant neoplasm of bronchus and lung- following with oncology every year to 2 years. Last scan was in July 2019   RLS- did not follow through with decrease requip to 1/2 tablet.   constipation controlled on metamucil.   Hypothyroid- synthroid 75 mcg TSH 4.58 in June.  Weakness- possible hypoglycemia no ongoing issues with this.   With frequent UTI- request a new urologist.   Anemia- continue on iron.  Review of Systems:  Review of Systems  Constitutional: Negative for chills, fever and weight loss.  Respiratory: Negative for cough, sputum production and shortness of breath.   Cardiovascular: Negative for chest pain, palpitations and  leg swelling.  Gastrointestinal: Negative for abdominal pain, constipation, diarrhea and heartburn.  Genitourinary: Positive for frequency. Negative for dysuria and urgency.  Musculoskeletal: Positive for myalgias. Negative for back pain, falls and joint pain.  Skin: Negative.   Neurological: Negative for dizziness, weakness and headaches.  Psychiatric/Behavioral: Positive for depression. Negative for  memory loss. The patient does not have insomnia.     Past Medical History:  Diagnosis Date  . Arachnoiditis   . Bipolar disorder ([place])   . Chronic back pain   . Chronic pain   . Chronic post-thoracotomy pain   . CKD (chronic kidney disease)   . GERD (gastroesophageal reflux disease)   . Hypogammaglobulinemia ([place])   . Hypotension   . Hypothyroid   . 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 Lung cancer ([place]) 2011, 2014  . Memory loss   . Migraine   . Opioid abuse ([place])   . Osteoporosis   . Presence of neurostimulator   . Recurrent upper respiratory infection (URI)   . Restless legs   . Sleep apnea   . Tremor   . Urge incontinence   . Urticaria    long time ago, nothing recent   Past Surgical History:  Procedure Laterality Date  . ABDOMINAL HYSTERECTOMY    . CARPAL TUNNEL RELEASE    . CATARACT EXTRACTION Bilateral 2019  . CHOLECYSTECTOMY    . COLONOSCOPY  2015   [facility]  . CT LUNG SCREENING  2018  . DG  BONE DENSITY ([place] [facility])  2018  . DIAGNOSTIC MAMMOGRAM  2019  . GASTRIC BYPASS    . LUNG CANCER SURGERY  02/2010  . SPINAL CORD STIMULATOR IMPLANT     Social History:   reports that she has never smoked. She has never used smokeless tobacco. She reports previous alcohol use. She reports that she does not use drugs.  Family History  Problem Relation Age of Onset  . Hypertension Mother   . GER disease Mother   . Dementia Mother   . Osteoporosis Mother   . Arthritis Mother   . Bipolar disorder Mother   . Parkinson's disease Father   . Congestive Heart Failure Father   . Pneumonia Father   . Arthritis Father   . Dementia Father   . Depression Father   . Asthma Sister   . Depression Sister   . Bipolar disorder Brother   . Asthma Daughter     Medications: Patient's Medications  New Prescriptions   No medications on file  Previous Medications   ACETAMINOPHEN (TYLENOL) 650 MG CR TABLET    Take 1,300 mg by mouth every 8 (eight) hours as needed for pain or  fever.    APOAEQUORIN (PREVAGEN PO)    Take 1 capsule by mouth daily.    AZELASTINE (ASTELIN) 0.1 % NASAL SPRAY    SMARTSIG:1 Spray(s) Both Nares 1 to 2 Times Daily PRN   AZELASTINE-FLUTICASONE 137-50 MCG/ACT SUSP    1 spray per nostril twice a day as needed   BUPRENORPHINE HCL-NALOXONE HCL (SUBOXONE) 4-1 MG FILM    Place 1 Film under the tongue daily.    CALCIUM CITRATE 200 MG TABS    Take 1 tablet by mouth daily.    CARBINOXAMINE MALEATE 4 MG TABS    One tablet every 8 hours if needed   CYCLOSPORINE (RESTASIS) 0.05 % OPHTHALMIC EMULSION    Place 1 drop into both eyes 2 (two) times daily.   DEXLANSOPRAZOLE (DEXILANT) 60 MG CAPSULE  Take 1 capsule (60 mg total) by mouth daily. Crack open the capsule and pour into spoon prior to ingestion   ERENUMAB-AOOE (AIMOVIG [place])    Inject into the skin every 30 (thirty) days.    FAMOTIDINE (PEPCID) 20 MG TABLET    Take 1 tablet (20 mg total) by mouth 2 (two) times daily.   FERROUS SULFATE 325 (65 FE) MG TABLET    Take 325 mg by mouth 2 (two) times daily with a meal.   IMMUNE GLOBULIN, HUMAN, (GAMMAGARD S/D LESS IGA) 10 G INJECTION    Inject into the vein every 21 ( twenty-one) days.    LACTULOSE (CHRONULAC) 10 GM/15ML SOLUTION    Take 30 mLs (20 g total) by mouth daily.   LASTACAFT 0.25 % SOLN    Apply 1 drop to eye daily.   LEVOTHYROXINE (SYNTHROID) 75 MCG TABLET    Take 1 tablet (75 mcg total) by mouth daily before breakfast.   LUBIPROSTONE (AMITIZA) 24 MCG CAPSULE    Take 1 capsule (24 mcg total) by mouth 2 (two) times daily.   MAGNESIUM OXIDE (MAG-OX) 400 MG TABLET    Take 400 mg by mouth daily.    MELATONIN 5 MG CAPS    Take 5 mg by mouth at bedtime as needed (sleep).    MISC NATURAL PRODUCTS (CRAN-B-OTC PO)    Take 1 capsule by mouth daily. 1 daily    OLANZAPINE (ZYPREXA) 5 MG TABLET    Take 1 tablet (5 mg total) by mouth at bedtime.   ONDANSETRON (ZOFRAN ODT) 4 MG DISINTEGRATING TABLET    Take 1 tablet (4 mg total) by mouth every 6 (six) hours as  needed for nausea.   OXCARBAZEPINE (TRILEPTAL) 150 MG TABLET    Take 150 mg by mouth daily. Will increase to BID after 5 days, then increase to TID after 5 days   POTASSIUM CHLORIDE SA (K-DUR) 20 MEQ TABLET    TAKE 2 TABLETS BY MOUTH DAILY (MAY DISSOLVE IN WATER)   PYRIDOXINE (VITAMIN B-6) 100 MG TABLET    Take 100 mg by mouth daily.   ROPINIROLE (REQUIP) 0.5 MG TABLET    Take 1 tablet (0.5 mg total) by mouth daily.   SUCRALFATE (CARAFATE) 1 GM/10ML SUSPENSION    Take 200 mg by mouth 3 (three) times daily as needed (indigestion).    TIZANIDINE (ZANAFLEX) 2 MG TABLET    Take 2 mg by mouth 3 (three) times daily as needed for muscle spasms.    TORSEMIDE (DEMADEX) 20 MG TABLET    Take 20 mg by mouth daily.    VITAMIN B-12 (CYANOCOBALAMIN) 1000 MCG TABLET    Take 1,000 mcg by mouth daily.   Modified Medications   No medications on file  Discontinued Medications   No medications on file    Physical Exam:  There were no vitals filed for this visit. There is no height or weight on file to calculate BMI. Wt Readings from Last 3 Encounters:  05/31/19 120 lb (54.4 kg)  04/03/19 116 lb (52.6 kg)  03/29/19 112 lb (50.8 kg)      Labs reviewed: Basic Metabolic Panel: Recent Labs    01/27/19 1048  02/27/19 1745  03/06/19 1624 03/16/19 2222 03/29/19 2206  NA 142  --  142   < > 137 138 135  K 4.5  --  4.1   < > 4.3 4.5 4.2  CL 104  --  102   < > 101 103 100  CO2  29  --  29   < > 26 26 27   GLUCOSE 70  --  83   < > 93 102* 113*  BUN 25  --  30*   < > 34* 38* 32*  CREATININE 1.00*   < > 1.56*   < > 1.38* 1.13* 1.12*  CALCIUM 9.3  --  9.3   < > 8.8* 8.7* 9.1  MG  --   --  2.0  --   --   --   --   TSH 4.58*  --   --   --   --   --   --    < > = values in this interval not displayed.   Liver Function Tests: Recent Labs    02/27/19 1745 03/06/19 1624 03/16/19 2222  AST 23 22 24   ALT 17 15 14   ALKPHOS 60 51 53  BILITOT 0.3 0.3 0.3  PROT 6.5 6.7 6.2*  ALBUMIN 3.5 3.4* 3.4*   No  results for input(s): LIPASE, AMYLASE in the last 8760 hours. No results for input(s): AMMONIA in the last 8760 hours. CBC: Recent Labs    03/06/19 1624 03/16/19 2222 03/29/19 2206 05/31/19 1157  WBC 4.9 4.3 2.9* 9.2  NEUTROABS 3.4  --  1.5* 7.0  HGB 10.6* 9.9* 9.6* 11.4*  HCT 33.6* 32.3* 30.2* 35.3*  MCV 88.9 90.0 88.3 83.0  PLT 139* 159 155 284.0   Lipid Panel: No results for input(s): CHOL, HDL, LDLCALC, TRIG, CHOLHDL, LDLDIRECT in the last 8760 hours. TSH: Recent Labs    01/27/19 1048  TSH 4.58*   A1C: No results found for: HGBA1C   Assessment/Plan 1. RLS (restless legs syndrome) -never attempted to cut back on requip as discussed at last visit, encouraged to start using only daily.    2. Weight loss -has improved, reports weight gain at this time with dietary modification and treatment for esophagitis.   3. Acquired hypothyroidism -TSH stable, continues on synthroid   4. Major depressive disorder with current active episode, unspecified depression episode severity, unspecified whether recurrent Stable, still feels like symptoms are moderate but improvement on zyprexa noted. Continues with close follow up with psych.  5. Weakness -symptoms of weakness and hypoglycemia have improved.  6. Recurrent UTI -seeing Dr Felipa Eth in [place] but reports she would like to start seeing a different urologist.  - Ambulatory referral to Urology  7. Intractable migraine without status migrainosus, unspecified migraine type -migraines are well controlled on current regimen   8. Esophagitis -improved, eating better and had had positive weight gain.  9. Drug-induced constipation -improved on metamucil only.  10. Osteoporosis, unspecified osteoporosis type, unspecified pathological fracture presence -due for prolia injection, reports rheumatology was doing this in the past but that current Rheumatologist will not manage. Prior authorization to be completed and we will  notify pt.  Next appt: 4 month, sooner if needed  Demtrius Rounds K. Harle Battiest  [facility] [phone]    Virtual Visit via Telephone Note  I connected with pt on 07/05/19 at  2:15 PM EST by telephone and verified that I am speaking with the correct person using two identifiers.  Location: Patient: home Provider: office   I discussed the limitations, risks, security and privacy concerns of performing an evaluation and management service by telephone and the availability of in person appointments. I also discussed with the patient that there may be a patient responsible charge related to this service. The patient expressed  understanding and agreed to proceed.   I discussed the assessment and treatment plan with the patient. The patient was provided an opportunity to ask questions and all were answered. The patient agreed with the plan and demonstrated an understanding of the instructions.   The patient was advised to call back or seek an in-person evaluation if the symptoms worsen or if the condition fails to improve as anticipated.  I provided 24 minutes of non-face-to-face time during this encounter.  Carlos American. Harle Battiest Avs printed and mailed

## 2019-07-05 NOTE — Patient Instructions (Signed)
Try to decrease requip to 1/2 tablet at bedtime for restless legs  We will do prior auth for prolia.

## 2019-07-21 ENCOUNTER — Ambulatory Visit (INDEPENDENT_AMBULATORY_CARE_PROVIDER_SITE_OTHER): Payer: Medicare Other | Admitting: Psychiatry

## 2019-07-21 ENCOUNTER — Other Ambulatory Visit: Payer: Self-pay

## 2019-07-21 ENCOUNTER — Encounter: Payer: Self-pay | Admitting: Psychiatry

## 2019-07-21 DIAGNOSIS — F5105 Insomnia due to other mental disorder: Secondary | ICD-10-CM

## 2019-07-21 DIAGNOSIS — F3162 Bipolar disorder, current episode mixed, moderate: Secondary | ICD-10-CM

## 2019-07-21 DIAGNOSIS — F419 Anxiety disorder, unspecified: Secondary | ICD-10-CM

## 2019-07-21 DIAGNOSIS — F99 Mental disorder, not otherwise specified: Secondary | ICD-10-CM | POA: Diagnosis not present

## 2019-07-21 MED ORDER — OLANZAPINE 7.5 MG PO TABS: 7.5000 mg | ORAL_TABLET | Freq: Every day | ORAL | 1 refills | Status: DC

## 2019-07-21 NOTE — Progress Notes (Signed)
Megan Brewer [id number] [birth year] 73 y.o.  Virtual Visit via Telephone Note  I connected with pt on 07/22/19 at  1:00 PM EST by telephone and verified that I am speaking with the correct person using two identifiers.   I discussed the limitations, risks, security and privacy concerns of performing an evaluation and management service by telephone and the availability of in person appointments. I also discussed with the patient that there may be a patient responsible charge related to this service. The patient expressed understanding and agreed to proceed.   I discussed the assessment and treatment plan with the patient. The patient was provided an opportunity to ask questions and all were answered. The patient agreed with the plan and demonstrated an understanding of the instructions.   The patient was advised to call back or seek an in-person evaluation if the symptoms worsen or if the condition fails to improve as anticipated.  I provided 30 minutes of non-face-to-face time during this encounter.  The patient was located at home.  The provider was located at [place].   Thayer Headings, PMHNP   Subjective:   Patient ID:  Megan Brewer is a 73 y.o. (DOB May 23, 1946) female.  Chief Complaint:  Chief Complaint  Patient presents with  . Anxiety  . Other    Tremor    HPI Megan Brewer presents for follow-up of anxiety, depression, and insomnia. "The anxiety is getting worse. I shake all the time and I am having trouble making a decision." She reports that she is "shaking all over but it is worse in my hands and legs." Reports hip, knee, and legs are also occasionally affected. She reports that shaking is constant and at times it is more intense. She reports having panic s/s and that anxiety has been progressively worsening over the last week.   She reports that she stopped Trileptal a week ago since she thought it may be causing shaking. She also stopped  Requip about a week ago.   She reports that her mood has been lower and is not laughing. She reports persistent sad mood. Denies irritability. She reports that her sleep is "ok, but not great." Appetite is ok. Motivation has been lower due to anxiety. Energy has been low and this is consistent with baseline. She reports poor concentration and focus. She reports chronic suicidal thoughts have improved.    Denies any other involuntary movements.   Past Psychiatric Medication Trials: Sertraline- Was effective for about 25 years and no longer seems to be as effective.  Prozac Paxil  Cymbalta- may have caused psychosis. Depakote Gabapentin Lithium- Edema. Minimally effective Seroquel Risperidone Zyprexa Melatonin Ativan- Reports taking until 2 months ago.  Valium- "great for my depression." Xanax Trazodone Requip- Took for years at 0.5 mg po qd.  Review of Systems:  Review of Systems  Gastrointestinal: Negative.   Musculoskeletal: Negative for gait problem.  Neurological: Positive for tremors. Negative for headaches.       No recent RLS  Psychiatric/Behavioral:       Please refer to HPI   Has apt to see pain management specialist.  Medications: I have reviewed the patient's current medications.  Current Outpatient Medications  Medication Sig Dispense Refill  . acetaminophen (TYLENOL) 650 MG CR tablet Take 1,300 mg by mouth every 8 (eight) hours as needed for pain or fever.     Marland Kitchen azelastine (ASTELIN) 0.1 % nasal spray SMARTSIG:1 Spray(s) Both Nares 1 to 2 Times Daily PRN    .  Azelastine-Fluticasone 137-50 MCG/ACT SUSP 1 spray per nostril twice a day as needed 23 g 5  . Buprenorphine HCl-Naloxone HCl (SUBOXONE) 4-1 MG FILM Place 1 Film under the tongue daily.     . Calcium Citrate 200 MG TABS Take 1 tablet by mouth daily.     . cycloSPORINE (RESTASIS) 0.05 % ophthalmic emulsion Place 1 drop into both eyes 2 (two) times daily.    Marland Kitchen dexlansoprazole (DEXILANT) 60 MG capsule Take 1  capsule (60 mg total) by mouth daily. Crack open the capsule and pour into spoon prior to ingestion 30 capsule 5  . Erenumab-aooe (AIMOVIG [place]) Inject into the skin every 30 (thirty) days.     . ferrous sulfate 325 (65 FE) MG tablet Take 325 mg by mouth 2 (two) times daily with a meal.    . levothyroxine (SYNTHROID) 75 MCG tablet Take 1 tablet (75 mcg total) by mouth daily before breakfast. 90 tablet 1  . magnesium oxide (MAG-OX) 400 MG tablet Take 400 mg by mouth daily.     Marland Kitchen OLANZapine (ZYPREXA) 7.5 MG tablet Take 1 tablet (7.5 mg total) by mouth at bedtime. 30 tablet 1  . potassium chloride SA (K-DUR) 20 MEQ tablet TAKE 2 TABLETS BY MOUTH DAILY (MAY DISSOLVE IN WATER) 60 tablet 0  . sucralfate (CARAFATE) 1 GM/10ML suspension Take 200 mg by mouth 3 (three) times daily as needed (indigestion).     Marland Kitchen tiZANidine (ZANAFLEX) 2 MG tablet Take 2 mg by mouth 3 (three) times daily as needed for muscle spasms.     Marland Kitchen torsemide (DEMADEX) 20 MG tablet Take 20 mg by mouth daily.     Marland Kitchen immune globulin, human, (GAMMAGARD S/D LESS IGA) 10 g injection Inject into the vein every 21 ( twenty-one) days.     Marland Kitchen LASTACAFT 0.25 % SOLN Apply 1 drop to eye daily.    . OXcarbazepine (TRILEPTAL) 150 MG tablet Take 150 mg by mouth daily. Will increase to BID after 5 days, then increase to TID after 5 days    . rOPINIRole (REQUIP) 0.5 MG tablet Take 1 tablet (0.5 mg total) by mouth daily. (Patient not taking: Reported on 07/21/2019) 30 tablet 1   No current facility-administered medications for this visit.     Medication Side Effects: None  Allergies:  Allergies  Allergen Reactions  . Tetracycline Nausea Only    Causes ringing in ears, dizziness, migraine, nausea  . Corticosteroids Other (See Comments)    12/02/2016 interview by Melissa Montane: Oral dosing causes migraines/nausea/tinnitus. Prev tolerated IV and intranasal admin w/o difficulty.  . Cefixime Other (See Comments)    Headache   . Ciprofloxacin Other (See Comments)     Headache, dizziness, ringing in the ears  . Doxycycline Other (See Comments)    unknown  . Gabapentin     Throat swells/closes  . Isometheptene-Dichloral-Apap Other (See Comments)    unknown  . Ketorolac     12/02/2016 interview by EPP: Oral dosing prednisone/corticosteroids causes migraines/nausea/tinnitus. Prev tolerated IV and intranasal admin w/o difficulty.  . Prednisone Other (See Comments)    Migraine, dizzy, ringing in ears-can take it IV 12/02/2016 interview by [name]: Oral prednisone dosing causes migraines/nausea/tinnitus. Prev tolerated IV and intranasal admin w/o difficulty.  . Promethazine Other (See Comments)    causes severe abdominal pain when taken IV; however she can take PO or IM  . Stadol [Butorphanol]     Cerebral pain  . Erythromycin Base Diarrhea and Itching    Severe diarrhea  .  Propoxyphene Nausea Only    Past Medical History:  Diagnosis Date  . Arachnoiditis   . Bipolar disorder ([place])   . Chronic back pain   . Chronic pain   . Chronic post-thoracotomy pain   . CKD (chronic kidney disease)   . GERD (gastroesophageal reflux disease)   . Hypogammaglobulinemia ([place])   . Hypotension   . Hypothyroid   . 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 Lung cancer ([place]) 2011, 2014  . Memory loss   . Migraine   . Opioid abuse ([place])   . Osteoporosis   . Presence of neurostimulator   . Recurrent upper respiratory infection (URI)   . Restless legs   . Sleep apnea   . Tremor   . Urge incontinence   . Urticaria    long time ago, nothing recent    Family History  Problem Relation Age of Onset  . Hypertension Mother   . GER disease Mother   . Dementia Mother   . Osteoporosis Mother   . Arthritis Mother   . Bipolar disorder Mother   . Parkinson's disease Father   . Congestive Heart Failure Father   . Pneumonia Father   . Arthritis Father   . Dementia Father   . Depression Father   . Asthma Sister   . Depression Sister   . Bipolar disorder  Brother   . Asthma Daughter     Social History   Socioeconomic History  . Marital status: Married    Spouse name: Not on file  . Number of children: Not on file  . Years of education: Not on file  . Highest education level: Not on file  Occupational History  . Not on file  Social Needs  . Financial resource strain: Not on file  . Food insecurity    Worry: Not on file    Inability: Not on file  . Transportation needs    Medical: Not on file    Non-medical: Not on file  Tobacco Use  . Smoking status: Never Smoker  . Smokeless tobacco: Never Used  Substance and Sexual Activity  . Alcohol use: Not Currently  . Drug use: Never  . Sexual activity: Not on file  Lifestyle  . Physical activity    Days per week: Not on file    Minutes per session: Not on file  . Stress: Not on file  Relationships  . Social Herbalist on phone: Not on file    Gets together: Not on file    Attends religious service: Not on file    Active member of club or organization: Not on file    Attends meetings of clubs or organizations: Not on file    Relationship status: Not on file  . Intimate partner violence    Fear of current or ex partner: Not on file    Emotionally abused: Not on file    Physically abused: Not on file    Forced sexual activity: Not on file  Other Topics Concern  . Not on file  Social History Narrative   Diet: None      Caffeine: coffee, tea, sodas less than or 1 daily.      Married, if yes what year: Yes, 1972      Do you live in a house, apartment, assisted living, condo, trailer, ect: Two story house       Pets: None      Current/Past profession: Teach  Exercise: None         Living Will: No   DNR: No   POA/HPOA: No      Functional Status:   Do you have difficulty bathing or dressing yourself? yes   Do you have difficulty preparing food or eating? yes   Do you have difficulty managing your medications? yes   Do you have difficulty managing  your finances?yes   Do you have difficulty affording your medications? no    Past Medical History, Surgical history, Social history, and Family history were reviewed and updated as appropriate.   Please see review of systems for further details on the patient's review from today.   Objective:   Physical Exam:  Wt 120 lb (54.4 kg)   BMI 21.26 kg/m   Physical Exam Neurological:     Mental Status: She is alert and oriented to person, place, and time.     Cranial Nerves: No dysarthria.  Psychiatric:        Attention and Perception: Attention normal.        Mood and Affect: Mood is anxious.        Speech: Speech normal.        Behavior: Behavior is cooperative.        Thought Content: Thought content normal. Thought content is not paranoid or delusional. Thought content does not include homicidal or suicidal ideation. Thought content does not include homicidal or suicidal plan.        Cognition and Memory: Cognition and memory normal.        Judgment: Judgment normal.     Comments: Insight intact     Lab Review:     Component Value Date/Time   NA 135 03/29/2019 2206   K 4.2 03/29/2019 2206   CL 100 03/29/2019 2206   CO2 27 03/29/2019 2206   GLUCOSE 113 (H) 03/29/2019 2206   BUN 32 (H) 03/29/2019 2206   CREATININE 1.12 (H) 03/29/2019 2206   CREATININE 1.40 (H) 03/06/2019 1119   CALCIUM 9.1 03/29/2019 2206   PROT 6.2 (L) 03/16/2019 2222   ALBUMIN 3.4 (L) 03/16/2019 2222   AST 24 03/16/2019 2222   ALT 14 03/16/2019 2222   ALKPHOS 53 03/16/2019 2222   BILITOT 0.3 03/16/2019 2222   GFRNONAA 50 (L) 03/29/2019 2206   GFRNONAA 38 (L) 03/06/2019 1119   GFRAA 58 (L) 03/29/2019 2206   GFRAA 44 (L) 03/06/2019 1119       Component Value Date/Time   WBC 9.2 05/31/2019 1157   RBC 4.26 05/31/2019 1157   HGB 11.4 (L) 05/31/2019 1157   HCT 35.3 (L) 05/31/2019 1157   PLT 284.0 05/31/2019 1157   MCV 83.0 05/31/2019 1157   MCH 28.1 03/29/2019 2206   MCHC 32.4 05/31/2019 1157    RDW 14.0 05/31/2019 1157   LYMPHSABS 1.3 05/31/2019 1157   MONOABS 0.8 05/31/2019 1157   EOSABS 0.1 05/31/2019 1157   BASOSABS 0.1 05/31/2019 1157    No results found for: POCLITH, LITHIUM   No results found for: PHENYTOIN, PHENOBARB, VALPROATE, CBMZ   .res Assessment: Plan:   Case staffed with Dr. Clovis Pu. Pt seen for 30 minutes and greater than 50% of visit spent counseling pt re: recent shaking and that cause is unclear since s/s coincided with multiple medication changes. Recommended waiting a week if possible to determine if s/s improve, however pt reports that she would prefer to change medications immediately due to severity of s/s. Discussed increasing Olanzapine to 7.5 mg po QHS  for worsening anxiety and mood.  Discussed that increase in Olanzapine could result in worsening shaking and that she should contact office if this occurs.  Pt to f/u in 4 weeks or sooner if clinically indicated.  Patient advised to contact office with any questions, adverse effects, or acute worsening in signs and symptoms.   Triston was seen today for anxiety and other.  Diagnoses and all orders for this visit:  Bipolar disorder, current episode mixed, moderate (HCC) -     OLANZapine (ZYPREXA) 7.5 MG tablet; Take 1 tablet (7.5 mg total) by mouth at bedtime.  Anxiety disorder, unspecified type -     OLANZapine (ZYPREXA) 7.5 MG tablet; Take 1 tablet (7.5 mg total) by mouth at bedtime.  Insomnia due to other mental disorder -     OLANZapine (ZYPREXA) 7.5 MG tablet; Take 1 tablet (7.5 mg total) by mouth at bedtime.    Please see After Visit Summary for patient specific instructions.  Future Appointments  Date Time Provider [place]  08/02/2019 11:00 AM Armbruster, Carlota Raspberry, MD [facility]  11/03/2019  2:45 PM [name], NP [facility] None    No orders of the defined types were placed in this encounter.     -------------------------------

## 2019-07-25 ENCOUNTER — Encounter: Payer: Self-pay | Admitting: Family

## 2019-07-25 ENCOUNTER — Ambulatory Visit (INDEPENDENT_AMBULATORY_CARE_PROVIDER_SITE_OTHER): Payer: Medicare Other | Admitting: Family

## 2019-07-25 ENCOUNTER — Other Ambulatory Visit: Payer: Self-pay

## 2019-07-25 DIAGNOSIS — J0101 Acute recurrent maxillary sinusitis: Secondary | ICD-10-CM

## 2019-07-25 MED ORDER — SACCHAROMYCES BOULARDII 250 MG PO CAPS: 250.0000 mg | ORAL_CAPSULE | Freq: Two times a day (BID) | ORAL | 0 refills | Status: AC

## 2019-07-25 MED ORDER — AMOXICILLIN 500 MG PO CAPS: 500.0000 mg | ORAL_CAPSULE | Freq: Three times a day (TID) | ORAL | 0 refills | Status: DC

## 2019-07-25 NOTE — Patient Instructions (Signed)

## 2019-07-25 NOTE — Progress Notes (Signed)
This service is provided via telemedicine  No vital signs collected/recorded due to the encounter was a telemedicine visit.   Location of patient (ex: home, work):  Home   Patient consents to a telephone visit:  yes  Location of the provider (ex: office, home):  Office  Name of any referring provider:  Sherrie Mustache, NP   Names of all persons participating in the telemedicine service and their role in the encounter:  Marlowe Sax, NP, Ruthell Rummage CMA, and patient   Time spent on call:  Ruthell Rummage CMA spent 6 minutes on phone with patient    Provider: Dinah Ngetich FNP-C  [name], NP  Patient Care Team: [name], NP as PCP - General (Geriatric Medicine)  Extended Emergency Contact Information Primary Emergency Contact: [facility] Address: [address] [place] Johnnette Litter of [place] Phone: [phone] Mobile Phone: [phone] Relation: Spouse  Code Status: Full Code  Goals of care: Advanced Directive information Advanced Directives 07/05/2019  Does Patient Have a Medical Advance Directive? No  Type of Advance Directive -  Copy of [place] in Chart? -  Would patient like information on creating a medical advance directive? -     Chief Complaint  Patient presents with  . Acute Visit    Patient c/o of sinus infection and would like antibiotics, patient duration of a month started off with drainage and allergy type symptoms. Patient states also has dizzy and headache.   . Medication Management    using goodies headache powder and mucinex     HPI:  Pt is a 73 y.o. female seen today for an acute visit for evaluation of  sinus infection x 1 month.she states started off with drainage and allergy type symptoms draining clear drainage but has gotten worse now draining yellow/greenish.she has left side headache and tender over the left sinus.She also states gets dizzy whenever she changes position  from one side to the other sometimes.she has used goodies headache powder and mucinex without any relief.she denies any fever but has had chills.Denies any sore throat,loss of taste or smell.she states has had no contact with sick person with COVID-19.states staying home with Husband.    Past Medical History:  Diagnosis Date  . Arachnoiditis   . Bipolar disorder ([place])   . Chronic back pain   . Chronic pain   . Chronic post-thoracotomy pain   . CKD (chronic kidney disease)   . GERD (gastroesophageal reflux disease)   . Hypogammaglobulinemia ([place])   . Hypotension   . Hypothyroid   . 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 Lung cancer ([place]) 2011, 2014  . Memory loss   . Migraine   . Opioid abuse ([place])   . Osteoporosis   . Presence of neurostimulator   . Recurrent upper respiratory infection (URI)   . Restless legs   . Sleep apnea   . Tremor   . Urge incontinence   . Urticaria    long time ago, nothing recent   Past Surgical History:  Procedure Laterality Date  . ABDOMINAL HYSTERECTOMY    . CARPAL TUNNEL RELEASE    . CATARACT EXTRACTION Bilateral 2019  . CHOLECYSTECTOMY    . COLONOSCOPY  2015   [facility]  . CT LUNG SCREENING  2018  . DG  BONE DENSITY ([place] HX)  2018  . DIAGNOSTIC MAMMOGRAM  2019  . GASTRIC BYPASS    .  LUNG CANCER SURGERY  02/2010  . SPINAL CORD STIMULATOR IMPLANT      Allergies  Allergen Reactions  . Tetracycline Nausea Only    Causes ringing in ears, dizziness, migraine, nausea  . Corticosteroids Other (See Comments)    12/02/2016 interview by Melissa Montane: Oral dosing causes migraines/nausea/tinnitus. Prev tolerated IV and intranasal admin w/o difficulty.  . Cefixime Other (See Comments)    Headache   . Ciprofloxacin Other (See Comments)    Headache, dizziness, ringing in the ears  . Doxycycline Other (See Comments)    unknown  . Gabapentin     Throat swells/closes  . Isometheptene-Dichloral-Apap Other (See Comments)    unknown  . Ketorolac      12/02/2016 interview by YKD: Oral dosing prednisone/corticosteroids causes migraines/nausea/tinnitus. Prev tolerated IV and intranasal admin w/o difficulty.  . Prednisone Other (See Comments)    Migraine, dizzy, ringing in ears-can take it IV 12/02/2016 interview by [name]: Oral prednisone dosing causes migraines/nausea/tinnitus. Prev tolerated IV and intranasal admin w/o difficulty.  . Promethazine Other (See Comments)    causes severe abdominal pain when taken IV; however she can take PO or IM  . Stadol [Butorphanol]     Cerebral pain  . Erythromycin Base Diarrhea and Itching    Severe diarrhea  . Propoxyphene Nausea Only    Outpatient Encounter Medications as of 07/25/2019  Medication Sig  . acetaminophen (TYLENOL) 650 MG CR tablet Take 1,300 mg by mouth every 8 (eight) hours as needed for pain or fever.   Marland Kitchen azelastine (ASTELIN) 0.1 % nasal spray SMARTSIG:1 Spray(s) Both Nares 1 to 2 Times Daily PRN  . Azelastine-Fluticasone 137-50 MCG/ACT SUSP 1 spray per nostril twice a day as needed  . Buprenorphine HCl-Naloxone HCl (SUBOXONE) 4-1 MG FILM Place 1 Film under the tongue daily.   . Calcium Citrate 200 MG TABS Take 1 tablet by mouth daily.   . cycloSPORINE (RESTASIS) 0.05 % ophthalmic emulsion Place 1 drop into both eyes 2 (two) times daily.  Marland Kitchen dexlansoprazole (DEXILANT) 60 MG capsule Take 1 capsule (60 mg total) by mouth daily. Crack open the capsule and pour into spoon prior to ingestion  . Erenumab-aooe (AIMOVIG [place]) Inject into the skin every 30 (thirty) days.   . ferrous sulfate 325 (65 FE) MG tablet Take 325 mg by mouth 2 (two) times daily with a meal.  . immune globulin, human, (GAMMAGARD S/D LESS IGA) 10 g injection Inject into the vein every 21 ( twenty-one) days.   Marland Kitchen LASTACAFT 0.25 % SOLN Apply 1 drop to eye daily.  Marland Kitchen levothyroxine (SYNTHROID) 75 MCG tablet Take 1 tablet (75 mcg total) by mouth daily before breakfast.  . magnesium oxide (MAG-OX) 400 MG tablet Take 400 mg by mouth  daily.   Marland Kitchen OLANZapine (ZYPREXA) 7.5 MG tablet Take 1 tablet (7.5 mg total) by mouth at bedtime.  . potassium chloride SA (K-DUR) 20 MEQ tablet TAKE 2 TABLETS BY MOUTH DAILY (MAY DISSOLVE IN WATER)  . tiZANidine (ZANAFLEX) 2 MG tablet Take 2 mg by mouth 3 (three) times daily as needed for muscle spasms.   Marland Kitchen torsemide (DEMADEX) 20 MG tablet Take 20 mg by mouth daily.   . [DISCONTINUED] OXcarbazepine (TRILEPTAL) 150 MG tablet Take 150 mg by mouth daily. Will increase to BID after 5 days, then increase to TID after 5 days  . [DISCONTINUED] rOPINIRole (REQUIP) 0.5 MG tablet Take 1 tablet (0.5 mg total) by mouth daily. (Patient not taking: Reported on 07/21/2019)  . [DISCONTINUED] sucralfate (CARAFATE)  1 GM/10ML suspension Take 200 mg by mouth 3 (three) times daily as needed (indigestion).    No facility-administered encounter medications on file as of 07/25/2019.     Review of Systems  Constitutional: Positive for chills. Negative for appetite change, fatigue and fever.  HENT: Positive for congestion, postnasal drip, sinus pressure and sinus pain. Negative for sneezing, sore throat and trouble swallowing.        No loss of taste or smell   Eyes: Negative for pain, discharge, redness and itching.  Respiratory: Negative for cough, chest tightness, shortness of breath and wheezing.   Cardiovascular: Negative for chest pain, palpitations and leg swelling.  Gastrointestinal: Negative for abdominal distention, abdominal pain, diarrhea, nausea and vomiting.  Skin: Negative for color change, pallor and rash.  Neurological: Positive for headaches. Negative for light-headedness.  Psychiatric/Behavioral: Negative for agitation and sleep disturbance. The patient is not nervous/anxious.     Immunization History  Administered Date(s) Administered  . Fluad Quad(high Dose 65+) 06/06/2019  . Influenza, Seasonal, Injecte, Preservative Fre 11/14/2012  . Influenza,inj,Quad PF,6+ Mos 05/24/2018  .  Influenza-Unspecified 04/17/2016, 06/09/2017  . Pneumococcal Conjugate-13 07/29/2017  . Pneumococcal Polysaccharide-23 07/29/2017  . Tdap 05/30/2017  . Zoster Recombinat (Shingrix) 06/17/2017   Pertinent  Health Maintenance Due  Topic Date Due  . PNA vac Low Risk Adult (2 of 2 - PCV13) 07/29/2018  . INFLUENZA VACCINE  03/18/2019  . MAMMOGRAM  02/12/2021  . COLONOSCOPY  02/02/2024  . DEXA SCAN  Completed   Fall Risk  07/25/2019 07/05/2019 04/03/2019 03/14/2019 03/06/2019  Falls in the past year? 0 0 1 0 0  Number falls in past yr: 0 - 0 0 0  Injury with Fall? 0 0 0 0 0   There were no vitals filed for this visit. There is no height or weight on file to calculate BMI. Physical Exam Unable to complete on telephone visit  Labs reviewed: Recent Labs    02/27/19 1745  03/06/19 1624 03/16/19 2222 03/29/19 2206  NA 142   < > 137 138 135  K 4.1   < > 4.3 4.5 4.2  CL 102   < > 101 103 100  CO2 29   < > 26 26 27   GLUCOSE 83   < > 93 102* 113*  BUN 30*   < > 34* 38* 32*  CREATININE 1.56*   < > 1.38* 1.13* 1.12*  CALCIUM 9.3   < > 8.8* 8.7* 9.1  MG 2.0  --   --   --   --    < > = values in this interval not displayed.   Recent Labs    02/27/19 1745 03/06/19 1624 03/16/19 2222  AST 23 22 24   ALT 17 15 14   ALKPHOS 60 51 53  BILITOT 0.3 0.3 0.3  PROT 6.5 6.7 6.2*  ALBUMIN 3.5 3.4* 3.4*   Recent Labs    03/06/19 1624 03/16/19 2222 03/29/19 2206 05/31/19 1157  WBC 4.9 4.3 2.9* 9.2  NEUTROABS 3.4  --  1.5* 7.0  HGB 10.6* 9.9* 9.6* 11.4*  HCT 33.6* 32.3* 30.2* 35.3*  MCV 88.9 90.0 88.3 83.0  PLT 139* 159 155 284.0   Lab Results  Component Value Date   TSH 4.58 (H) 01/27/2019   No results found for: HGBA1C Lab Results  Component Value Date   CHOL  01/27/2010    144        ATP III CLASSIFICATION:  <200     mg/dL  Desirable  200-239  mg/dL   Borderline High  >=240    mg/dL   High          HDL 58 01/27/2010   LDLCALC  01/27/2010    72        Total  Cholesterol/HDL:CHD Risk Coronary Heart Disease Risk Table                     Men   Women  1/2 Average Risk   3.4   3.3  Average Risk       5.0   4.4  2 X Average Risk   9.6   7.1  3 X Average Risk  23.4   11.0        Use the calculated Patient Ratio above and the CHD Risk Table to determine the patient's CHD Risk.        ATP III CLASSIFICATION (LDL):  <100     mg/dL   Optimal  100-129  mg/dL   Near or Above                    Optimal  130-159  mg/dL   Borderline  160-189  mg/dL   High  >190     mg/dL   Very High   TRIG 68 01/27/2010   CHOLHDL 2.5 01/27/2010    Significant Diagnostic Results in last 30 days:  No results found.  Assessment/Plan 1. Acute recurrent maxillary sinusitis Afebrile but reports chills.No contact with sick person with COVID-19.Tends to have frequent sinusitis.States does not tolerated Augmentin and allergic to Doxycycline. Has allergies also to cefixime but states can take amoxicillin.will treat with amoxicillin.Encouraged warm fluid intake or soups. - amoxicillin (AMOXIL) 500 MG capsule; Take 1 capsule (500 mg total) by mouth 3 (three) times daily.  Dispense: 30 capsule; Refill: 0 - saccharomyces boulardii (FLORASTOR) 250 MG capsule; Take 1 capsule (250 mg total) by mouth 2 (two) times daily for 10 days.  Dispense: 20 capsule; Refill: 0  Family/ staff Communication: Reviewed plan of care with patient   Labs/tests ordered: None  Next appointment: PRN   Spent 11 minutes of non-face to face with patient   Sandrea Hughs, NP

## 2019-08-01 ENCOUNTER — Ambulatory Visit: Payer: Medicare Other

## 2019-08-02 ENCOUNTER — Ambulatory Visit (INDEPENDENT_AMBULATORY_CARE_PROVIDER_SITE_OTHER): Payer: Medicare Other | Admitting: Gastroenterology

## 2019-08-02 ENCOUNTER — Telehealth: Payer: Self-pay | Admitting: Psychiatry

## 2019-08-02 DIAGNOSIS — D509 Iron deficiency anemia, unspecified: Secondary | ICD-10-CM | POA: Diagnosis not present

## 2019-08-02 DIAGNOSIS — K219 Gastro-esophageal reflux disease without esophagitis: Secondary | ICD-10-CM | POA: Diagnosis not present

## 2019-08-02 DIAGNOSIS — R110 Nausea: Secondary | ICD-10-CM | POA: Diagnosis not present

## 2019-08-02 MED ORDER — DEXILANT 60 MG PO CPDR: 60.0000 mg | DELAYED_RELEASE_CAPSULE | Freq: Every day | ORAL | 5 refills | Status: DC

## 2019-08-02 NOTE — Telephone Encounter (Signed)
FYI- Pt called to say that her shakes are better, but the rest of symptoms are the same. I made her next avail appt in Jan.

## 2019-08-02 NOTE — Progress Notes (Signed)
THIS ENCOUNTER IS A VIRTUAL VISIT DUE TO COVID-19 - PATIENT WAS NOT SEEN IN THE OFFICE. PATIENT HAS CONSENTED TO VIRTUAL TELEMEDICINE VISIT   Location of patient: home Location of provider: office Persons participating: myself, patient Time spent on call:  15 minutes    HPI :  73 year old female here for a follow-up visit.  She has a history of CVID, history of gastric bypass, chronic pain on narcotics, GERD, iron deficiency.   I last saw her in October and she had improved reflux symptoms but ongoing nausea, lower chest pain/epigastric spasms and a sense of fullness in her stomach with decreased appetite.  Previously I had performed an endoscopy in June 2020.  She had moderate esophagitis with adherent old heme of the lower esophagus.  She had mild erythema of the gastric pouch which was negative for H. pylori and normal small bowel.  At her last visit I had recommended her that she crack her Dexilant capsule dose at 60 mg once a day and open it and swallow contents given her gastric bypass status.  I also recommend placing her on Pepcid and using Zofran as needed.  Previously when she was on Prevacid Solutab her symptoms were severe.  Generally she has been doing much better since of last seen her.  The reflux and pyrosis is gone.  She is not having much nausea at all, has not needed to take the Zofran, has stopped taking the Pepcid.  She thinks just cracking the capsule of Dexilant opened and ingesting the contents has worked much better for her.  She was noted to have severe iron deficiency on labs associated with a mild anemia.  I had discussed performing a colonoscopy for her will be found this, though she declined at the time.  She previously has had some problems with constipation and on chronic narcotics which is making this difficult.  I referred her to chronic pain management, they are working to get her off Suboxone and narcotic free however she continues to take it at this time.  She  states her constipation has not been bothering her too much lately.  She has been using Metamucil more than anything and happy with the regimen.  She denies any blood in her stools.  Her last colonoscopy was 5 and half years ago at [facility], as outlined below.  She has taken an oral iron supplementation and tolerating it well.  She denies any NSAID use.  She states she continues to want to avoid colonoscopy and endoscopy right now in the wintertime as her sinuses are not doing well, she would prefer to do this in the springtime if able to.  She continues to have problems with her sinuses and does not feel well today in that regard, she preferred to telephone visit today so I was unable to see her in person.  Prior workup: CT scan chest 10/25/18 - no evidence of mass lesions, h/o cholecystectomy. Her lung cancer is reportedly in remission.  CT angio 02/27/19 - no PE or new changes.   CT abdomen and pelvis 10/25/18 [facility]) - no new or concerning changes.   Colonoscopy 02/01/2014 @ [facility] - 67mm polyp ascending colon, 2 sigmoid polyps removed - small - told to repeat in 5 years, prep good - path shows hyperplastic polyps EGD 02/01/2014 - roux-en-Y, healthy in appearance, normalotherwise  EGD in June 2020 - moderate esophagitis with adherent old heme, sharply angulated turn in the lower esophagus entering the gastric pouch.  She had  a Roux-en-Y anastomosis that looked okay without any obvious ulceration.  Mild erythema of the gastric pouch for which biopsies were obtained.  The small bowel and was normal.  Biopsies for H. pylori were normal.     Past Medical History:  Diagnosis Date  . Arachnoiditis   . Bipolar disorder ([place])   . Chronic back pain   . Chronic pain   . Chronic post-thoracotomy pain   . CKD (chronic kidney disease)   . GERD (gastroesophageal reflux disease)   . Hypogammaglobulinemia ([place])   . Hypotension   . Hypothyroid   . 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 Lung cancer  ([place]) 2011, 2014  . Memory loss   . Migraine   . Opioid abuse ([place])   . Osteoporosis   . Presence of neurostimulator   . Recurrent upper respiratory infection (URI)   . Restless legs   . Sleep apnea   . Tremor   . Urge incontinence   . Urticaria    long time ago, nothing recent     Past Surgical History:  Procedure Laterality Date  . ABDOMINAL HYSTERECTOMY    . CARPAL TUNNEL RELEASE    . CATARACT EXTRACTION Bilateral 2019  . CHOLECYSTECTOMY    . COLONOSCOPY  2015   [facility]  . CT LUNG SCREENING  2018  . DG  BONE DENSITY ([place] HX)  2018  . DIAGNOSTIC MAMMOGRAM  2019  . GASTRIC BYPASS    . LUNG CANCER SURGERY  02/2010  . SPINAL CORD STIMULATOR IMPLANT     Family History  Problem Relation Age of Onset  . Hypertension Mother   . GER disease Mother   . Dementia Mother   . Osteoporosis Mother   . Arthritis Mother   . Bipolar disorder Mother   . Parkinson's disease Father   . Congestive Heart Failure Father   . Pneumonia Father   . Arthritis Father   . Dementia Father   . Depression Father   . Asthma Sister   . Depression Sister   . Bipolar disorder Brother   . Asthma Daughter    Social History   Tobacco Use  . Smoking status: Never Smoker  . Smokeless tobacco: Never Used  Substance Use Topics  . Alcohol use: Not Currently  . Drug use: Never   Current Outpatient Medications  Medication Sig Dispense Refill  . acetaminophen (TYLENOL) 650 MG CR tablet Take 1,300 mg by mouth every 8 (eight) hours as needed for pain or fever.     Marland Kitchen amoxicillin (AMOXIL) 500 MG capsule Take 1 capsule (500 mg total) by mouth 3 (three) times daily. 30 capsule 0  . azelastine (ASTELIN) 0.1 % nasal spray SMARTSIG:1 Spray(s) Both Nares 1 to 2 Times Daily PRN    . Azelastine-Fluticasone 137-50 MCG/ACT SUSP 1 spray per nostril twice a day as needed 23 g 5  . Buprenorphine HCl-Naloxone HCl (SUBOXONE) 4-1 MG FILM Place 1 Film under the tongue daily.     . Calcium Citrate 200 MG TABS Take 1  tablet by mouth daily.     . cycloSPORINE (RESTASIS) 0.05 % ophthalmic emulsion Place 1 drop into both eyes 2 (two) times daily.    Marland Kitchen dexlansoprazole (DEXILANT) 60 MG capsule Take 1 capsule (60 mg total) by mouth daily. Crack open the capsule and pour into spoon prior to ingestion 30 capsule 5  . Erenumab-aooe (AIMOVIG [place]) Inject into the skin every 30 (thirty) days.     . ferrous sulfate 325 (65  FE) MG tablet Take 325 mg by mouth 2 (two) times daily with a meal.    . immune globulin, human, (GAMMAGARD S/D LESS IGA) 10 g injection Inject into the vein every 21 ( twenty-one) days.     Marland Kitchen LASTACAFT 0.25 % SOLN Apply 1 drop to eye daily.    Marland Kitchen levothyroxine (SYNTHROID) 75 MCG tablet Take 1 tablet (75 mcg total) by mouth daily before breakfast. 90 tablet 1  . magnesium oxide (MAG-OX) 400 MG tablet Take 400 mg by mouth daily.     Marland Kitchen OLANZapine (ZYPREXA) 7.5 MG tablet Take 1 tablet (7.5 mg total) by mouth at bedtime. 30 tablet 1  . potassium chloride SA (K-DUR) 20 MEQ tablet TAKE 2 TABLETS BY MOUTH DAILY (MAY DISSOLVE IN WATER) 60 tablet 0  . saccharomyces boulardii (FLORASTOR) 250 MG capsule Take 1 capsule (250 mg total) by mouth 2 (two) times daily for 10 days. 20 capsule 0  . tiZANidine (ZANAFLEX) 2 MG tablet Take 2 mg by mouth 3 (three) times daily as needed for muscle spasms.     Marland Kitchen torsemide (DEMADEX) 20 MG tablet Take 20 mg by mouth daily.      No current facility-administered medications for this visit.   Allergies  Allergen Reactions  . Tetracycline Nausea Only    Causes ringing in ears, dizziness, migraine, nausea  . Corticosteroids Other (See Comments)    12/02/2016 interview by Melissa Montane: Oral dosing causes migraines/nausea/tinnitus. Prev tolerated IV and intranasal admin w/o difficulty.  . Cefixime Other (See Comments)    Headache   . Ciprofloxacin Other (See Comments)    Headache, dizziness, ringing in the ears  . Doxycycline Other (See Comments)    unknown  . Gabapentin     Throat  swells/closes  . Isometheptene-Dichloral-Apap Other (See Comments)    unknown  . Ketorolac     12/02/2016 interview by NFA: Oral dosing prednisone/corticosteroids causes migraines/nausea/tinnitus. Prev tolerated IV and intranasal admin w/o difficulty.  . Prednisone Other (See Comments)    Migraine, dizzy, ringing in ears-can take it IV 12/02/2016 interview by [name]: Oral prednisone dosing causes migraines/nausea/tinnitus. Prev tolerated IV and intranasal admin w/o difficulty.  . Promethazine Other (See Comments)    causes severe abdominal pain when taken IV; however she can take PO or IM  . Stadol [Butorphanol]     Cerebral pain  . Erythromycin Base Diarrhea and Itching    Severe diarrhea  . Propoxyphene Nausea Only     Review of Systems: All systems reviewed and negative except where noted in HPI.   Lab Results  Component Value Date   WBC 9.2 05/31/2019   HGB 11.4 (L) 05/31/2019   HCT 35.3 (L) 05/31/2019   MCV 83.0 05/31/2019   PLT 284.0 05/31/2019    Lab Results  Component Value Date   IRON 24 (L) 05/31/2019   FERRITIN 4.9 (L) 05/31/2019     Physical Exam: There were no vitals taken for this visit.  No exam  ASSESSMENT AND PLAN: 73 year old female here for reassessment of the following:  GERD / nausea / dyspepsia - significant improvement after cracking the capsule of Dexilant open prior to ingestion, she is no longer needing to use Pepcid or Zofran.  She is doing quite well and tolerating her meals, quite happy with the results so far.  Unfortunately she did not do very well with Prevacid Solutab.  She wants to continue the present dose of Dexilant since it is working so well right now, but we did  discuss long-term risks and benefits of it.  Namely she does have osteoporosis and this can increase her risk for bone fracture.  She has her osteoporosis being managed with Prolia however ideally would like to reduce her dose of PPI long-term and use minimal amount of  medication, I just do not think it is possible given she had moderate esophagitis on Prevacid.  She does not want to dose reduce Dexilant at this time, but will consider in her course. she agreed.  Otherwise she will continue to work with pain management to taper off the narcotics which may help some of her upper tract symptoms as well.  She will continue use Carafate as needed.  Iron deficiency anemia - this very well may be due to her post gastric bypass state however I do think she warrants a colonoscopy given her last exam was over 5 years ago.  Further I would consider repeating an EGD at the same time as her colonoscopy to ensure her moderate esophagitis has since intervally healed and ensure no evidence of Barrett's.  As above, she is declining this for now due to sinus issues in the weather, she is amenable to do it in the spring, we will reach out to her in a few months for scheduling.  She agreed. Otherwise asked her to go to the lab for repeat CBC and iron studies to ensure stable.  [place] Cellar, MD [facility]  Total time 15 minutes on phone

## 2019-08-02 NOTE — Patient Instructions (Signed)
If you are age 71 or older, your body mass index should be between 23-30. Your There is no height or weight on file to calculate BMI. If this is out of the aforementioned range listed, please consider follow up with your Primary Care Provider.  If you are age 6 or younger, your body mass index should be between 19-25. Your There is no height or weight on file to calculate BMI. If this is out of the aformentioned range listed, please consider follow up with your Primary Care Provider.   You will be due for a recall EGD/colonoscopy in 10-2019. We will send you a reminder in the mail when it gets closer to that time.  Your provider has requested that you go to the basement level for lab work before leaving today. Press "B" on the elevator. The lab is located at the first door on the left as you exit the elevator.  It was a pleasure to see you today!  Dr. Havery Moros

## 2019-08-03 ENCOUNTER — Ambulatory Visit: Payer: Medicare Other

## 2019-08-04 ENCOUNTER — Ambulatory Visit: Payer: Medicare Other

## 2019-08-14 ENCOUNTER — Other Ambulatory Visit: Payer: Self-pay | Admitting: Nurse Practitioner

## 2019-08-14 ENCOUNTER — Telehealth: Payer: Self-pay | Admitting: *Deleted

## 2019-08-14 DIAGNOSIS — G2581 Restless legs syndrome: Secondary | ICD-10-CM

## 2019-08-14 MED ORDER — ROPINIROLE HCL 0.25 MG PO TABS: 0.2500 mg | ORAL_TABLET | Freq: Every day | ORAL | 1 refills | Status: DC

## 2019-08-14 NOTE — Telephone Encounter (Signed)
Is this ok to add back to medication list. At what dosage and directions? Please Advise.

## 2019-08-14 NOTE — Telephone Encounter (Signed)
Medication list updated and Rx sent to pharmacy

## 2019-08-14 NOTE — Telephone Encounter (Signed)
It appears she stopped the requip (not that she was taken off). We discussed reducing dose to only taking at night.

## 2019-08-14 NOTE — Telephone Encounter (Signed)
Patient notified and agreed.  

## 2019-08-14 NOTE — Telephone Encounter (Signed)
Patient called and stated that she was taken off of the Requip. Patient stated that she needs something to take for her RLS. Stated that it is driving her crazy and needs something to help them.  Please Advise.

## 2019-08-17 ENCOUNTER — Other Ambulatory Visit: Payer: Self-pay

## 2019-08-17 ENCOUNTER — Ambulatory Visit: Payer: Medicare Other

## 2019-08-17 DIAGNOSIS — M810 Age-related osteoporosis without current pathological fracture: Secondary | ICD-10-CM

## 2019-08-17 MED ORDER — DENOSUMAB 60 MG/ML ~~LOC~~ SOSY
60.0000 mg | PREFILLED_SYRINGE | Freq: Once | SUBCUTANEOUS | Status: AC
  Administered 2019-08-17: 60 mg via SUBCUTANEOUS

## 2019-08-29 ENCOUNTER — Ambulatory Visit (INDEPENDENT_AMBULATORY_CARE_PROVIDER_SITE_OTHER): Payer: Medicare PPO | Admitting: Psychiatry

## 2019-08-29 ENCOUNTER — Encounter: Payer: Self-pay | Admitting: Psychiatry

## 2019-08-29 DIAGNOSIS — F3162 Bipolar disorder, current episode mixed, moderate: Secondary | ICD-10-CM

## 2019-08-29 DIAGNOSIS — F419 Anxiety disorder, unspecified: Secondary | ICD-10-CM | POA: Diagnosis not present

## 2019-08-29 DIAGNOSIS — F5105 Insomnia due to other mental disorder: Secondary | ICD-10-CM | POA: Diagnosis not present

## 2019-08-29 DIAGNOSIS — F99 Mental disorder, not otherwise specified: Secondary | ICD-10-CM | POA: Diagnosis not present

## 2019-08-29 MED ORDER — OLANZAPINE 7.5 MG PO TABS: 7.5000 mg | ORAL_TABLET | Freq: Every day | ORAL | 1 refills | Status: DC

## 2019-08-29 MED ORDER — LAMOTRIGINE 25 MG PO TABS: ORAL_TABLET | ORAL | 0 refills | Status: DC

## 2019-08-29 NOTE — Progress Notes (Signed)
Megan Brewer [id number] 01-13-1946 74 y.o.  Virtual Visit via Telephone Note  I connected with pt on 08/29/19 at  2:00 PM EST by telephone and verified that I am speaking with the correct person using two identifiers.   I discussed the limitations, risks, security and privacy concerns of performing an evaluation and management service by telephone and the availability of in person appointments. I also discussed with the patient that there may be a patient responsible charge related to this service. The patient expressed understanding and agreed to proceed.   I discussed the assessment and treatment plan with the patient. The patient was provided an opportunity to ask questions and all were answered. The patient agreed with the plan and demonstrated an understanding of the instructions.   The patient was advised to call back or seek an in-person evaluation if the symptoms worsen or if the condition fails to improve as anticipated.  I provided 25 minutes of non-face-to-face time during this encounter.  The patient was located at home.  The provider was located at [place].   Thayer Headings, PMHNP   Subjective:   Patient ID:  Megan Brewer is a 74 y.o. (DOB 07/10/1946) female.  Chief Complaint:  Chief Complaint  Patient presents with  . Depression  . Anxiety    HPI [name] presents for follow-up of depression, anxiety, and insomnia. She reports that she has been having difficulty doing things she needs to do. She reports that her motivation is low and has to force herself to get up and dreads getting going and everything feels like a burden. She reports that she looks forward to being able to go back to bed. Mood has been persistently sad. Has not been laughing or enjoying things. She reports that her mood has been irritable and she has been trying to limit this. She reports that her panic has lessened. She reports that she is sleeping ok. She reports  gaining about 28 lbs and some increase in appetite. Early satiety and then gets hungry again. Has been having trouble reading due to poor concentration. Denies any risky or impulsive behavior.   Sleep and anxiety have improved with increase in Zyprexa.   Denies SI.   Reports that shaking all over has improved. She reports that hands, feet, legs continue to shake some. Reports that RLS has improved but is not completely controlled.   Past Psychiatric Medication Trials: Sertraline- Was effective for about 25 years and no longer seems to be as effective.  Prozac Paxil  Cymbalta- may have caused psychosis. Depakote Gabapentin Lithium- Edema. Minimally effective Seroquel Risperidone Zyprexa Melatonin Ativan- Reports taking until 2 months ago.  Valium- "great for my depression." Xanax Trazodone Requip- Took for years at 0.5 mg po qd. Trileptal- shaking and night sweats  Review of Systems:  Review of Systems  Musculoskeletal: Positive for back pain. Negative for gait problem.       Pain in ribs  Neurological: Positive for tremors.       Improved RLS  Psychiatric/Behavioral:       Please refer to HPI    Medications: I have reviewed the patient's current medications.  Current Outpatient Medications  Medication Sig Dispense Refill  . acetaminophen (TYLENOL) 650 MG CR tablet Take 1,300 mg by mouth every 8 (eight) hours as needed for pain or fever.     Marland Kitchen azelastine (ASTELIN) 0.1 % nasal spray SMARTSIG:1 Spray(s) Both Nares 1 to 2 Times Daily PRN    .  Azelastine-Fluticasone 137-50 MCG/ACT SUSP 1 spray per nostril twice a day as needed 23 g 5  . Buprenorphine HCl-Naloxone HCl (SUBOXONE) 4-1 MG FILM Place 1 Film under the tongue daily.     . Calcium Citrate 200 MG TABS Take 1 tablet by mouth daily.     Marland Kitchen CRANBERRY PO Take by mouth.    . cycloSPORINE (RESTASIS) 0.05 % ophthalmic emulsion Place 1 drop into both eyes 2 (two) times daily.    . D-MANNOSE PO Take by mouth.    .  dexlansoprazole (DEXILANT) 60 MG capsule Take 1 capsule (60 mg total) by mouth daily. Crack open the capsule and pour into spoon prior to ingestion 30 capsule 5  . Erenumab-aooe (AIMOVIG [place]) Inject into the skin every 30 (thirty) days.     . ferrous sulfate 325 (65 FE) MG tablet Take 325 mg by mouth 2 (two) times daily with a meal.    . immune globulin, human, (GAMMAGARD S/D LESS IGA) 10 g injection Inject into the vein every 21 ( twenty-one) days.     Marland Kitchen levothyroxine (SYNTHROID) 75 MCG tablet Take 1 tablet (75 mcg total) by mouth daily before breakfast. 90 tablet 1  . magnesium oxide (MAG-OX) 400 MG tablet Take 400 mg by mouth daily.     . potassium chloride SA (K-DUR) 20 MEQ tablet TAKE 2 TABLETS BY MOUTH DAILY (MAY DISSOLVE IN WATER) 60 tablet 0  . rOPINIRole (REQUIP) 0.25 MG tablet Take 1 tablet (0.25 mg total) by mouth at bedtime. 30 tablet 1  . tiZANidine (ZANAFLEX) 2 MG tablet Take 2 mg by mouth 3 (three) times daily as needed for muscle spasms.     Marland Kitchen torsemide (DEMADEX) 20 MG tablet Take 20 mg by mouth daily.     Marland Kitchen amoxicillin (AMOXIL) 500 MG capsule Take 1 capsule (500 mg total) by mouth 3 (three) times daily. (Patient not taking: Reported on 08/29/2019) 30 capsule 0  . lamoTRIgine (LAMICTAL) 25 MG tablet Take 1 tablet (25 mg total) by mouth daily for 14 days, THEN 2 tablets (50 mg total) daily for 14 days. 45 tablet 0  . LASTACAFT 0.25 % SOLN Apply 1 drop to eye daily.    Marland Kitchen OLANZapine (ZYPREXA) 7.5 MG tablet Take 1 tablet (7.5 mg total) by mouth at bedtime. 30 tablet 1   No current facility-administered medications for this visit.    Medication Side Effects: Other: Weight gain  Allergies:  Allergies  Allergen Reactions  . Tetracycline Nausea Only    Causes ringing in ears, dizziness, migraine, nausea  . Corticosteroids Other (See Comments)    12/02/2016 interview by Melissa Montane: Oral dosing causes migraines/nausea/tinnitus. Prev tolerated IV and intranasal admin w/o difficulty.  .  Cefixime Other (See Comments)    Headache   . Ciprofloxacin Other (See Comments)    Headache, dizziness, ringing in the ears  . Doxycycline Other (See Comments)    unknown  . Gabapentin     Throat swells/closes  . Isometheptene-Dichloral-Apap Other (See Comments)    unknown  . Ketorolac     12/02/2016 interview by ZYS: Oral dosing prednisone/corticosteroids causes migraines/nausea/tinnitus. Prev tolerated IV and intranasal admin w/o difficulty.  . Prednisone Other (See Comments)    Migraine, dizzy, ringing in ears-can take it IV 12/02/2016 interview by [name]: Oral prednisone dosing causes migraines/nausea/tinnitus. Prev tolerated IV and intranasal admin w/o difficulty.  . Promethazine Other (See Comments)    causes severe abdominal pain when taken IV; however she can take PO or IM  .  Stadol [Butorphanol]     Cerebral pain  . Erythromycin Base Diarrhea and Itching    Severe diarrhea  . Propoxyphene Nausea Only    Past Medical History:  Diagnosis Date  . Arachnoiditis   . Bipolar disorder ([place])   . Chronic back pain   . Chronic pain   . Chronic post-thoracotomy pain   . CKD (chronic kidney disease)   . GERD (gastroesophageal reflux disease)   . Hypogammaglobulinemia ([place])   . Hypotension   . Hypothyroid   . 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 Lung cancer ([place]) 2011, 2014  . Memory loss   . Migraine   . Opioid abuse ([place])   . Osteoporosis   . Presence of neurostimulator   . Recurrent upper respiratory infection (URI)   . Restless legs   . Sleep apnea   . Tremor   . Urge incontinence   . Urticaria    long time ago, nothing recent    Family History  Problem Relation Age of Onset  . Hypertension Mother   . GER disease Mother   . Dementia Mother   . Osteoporosis Mother   . Arthritis Mother   . Bipolar disorder Mother   . Parkinson's disease Father   . Congestive Heart Failure Father   . Pneumonia Father   . Arthritis Father   . Dementia Father   .  Depression Father   . Asthma Sister   . Depression Sister   . Bipolar disorder Brother   . Asthma Daughter     Social History   Socioeconomic History  . Marital status: Married    Spouse name: Not on file  . Number of children: Not on file  . Years of education: Not on file  . Highest education level: Not on file  Occupational History  . Not on file  Tobacco Use  . Smoking status: Never Smoker  . Smokeless tobacco: Never Used  Substance and Sexual Activity  . Alcohol use: Not Currently  . Drug use: Never  . Sexual activity: Not on file  Other Topics Concern  . Not on file  Social History Narrative   Diet: None      Caffeine: coffee, tea, sodas less than or 1 daily.      Married, if yes what year: Yes, 1972      Do you live in a house, apartment, assisted living, condo, trailer, ect: Two story house       Pets: None      Current/Past profession: Teach      Exercise: None         Living Will: No   DNR: No   POA/HPOA: No      Functional Status:   Do you have difficulty bathing or dressing yourself? yes   Do you have difficulty preparing food or eating? yes   Do you have difficulty managing your medications? yes   Do you have difficulty managing your finances?yes   Do you have difficulty affording your medications? no   Social Determinants of Health   Financial Resource Strain:   . Difficulty of Paying Living Expenses: Not on file  Food Insecurity:   . Worried About Charity fundraiser in the Last Year: Not on file  . Ran Out of Food in the Last Year: Not on file  Transportation Needs:   . Lack of Transportation (Medical): Not on file  . Lack of Transportation (Non-Medical): Not on file  Physical Activity:   .  Days of Exercise per Week: Not on file  . Minutes of Exercise per Session: Not on file  Stress:   . Feeling of Stress : Not on file  Social Connections:   . Frequency of Communication with Friends and Family: Not on file  . Frequency of Social  Gatherings with Friends and Family: Not on file  . Attends Religious Services: Not on file  . Active Member of Clubs or Organizations: Not on file  . Attends Archivist Meetings: Not on file  . Marital Status: Not on file  Intimate Partner Violence:   . Fear of Current or Ex-Partner: Not on file  . Emotionally Abused: Not on file  . Physically Abused: Not on file  . Sexually Abused: Not on file    Past Medical History, Surgical history, Social history, and Family history were reviewed and updated as appropriate.   Please see review of systems for further details on the patient's review from today.   Objective:   Physical Exam:  There were no vitals taken for this visit.  Physical Exam Skin:    Coloration: Jaundice: 6+6+45.  Neurological:     Mental Status: She is alert and oriented to person, place, and time.     Cranial Nerves: No dysarthria.  Psychiatric:        Attention and Perception: Attention and perception normal.        Mood and Affect: Mood is depressed.        Speech: Speech normal.        Behavior: Behavior is cooperative.        Thought Content: Thought content normal. Thought content is not paranoid or delusional. Thought content does not include homicidal or suicidal ideation. Thought content does not include homicidal or suicidal plan.        Cognition and Memory: Cognition and memory normal.        Judgment: Judgment normal.     Comments: Insight intact     Lab Review:     Component Value Date/Time   NA 135 03/29/2019 2206   K 4.2 03/29/2019 2206   CL 100 03/29/2019 2206   CO2 27 03/29/2019 2206   GLUCOSE 113 (H) 03/29/2019 2206   BUN 32 (H) 03/29/2019 2206   CREATININE 1.12 (H) 03/29/2019 2206   CREATININE 1.40 (H) 03/06/2019 1119   CALCIUM 9.1 03/29/2019 2206   PROT 6.2 (L) 03/16/2019 2222   ALBUMIN 3.4 (L) 03/16/2019 2222   AST 24 03/16/2019 2222   ALT 14 03/16/2019 2222   ALKPHOS 53 03/16/2019 2222   BILITOT 0.3 03/16/2019 2222    GFRNONAA 50 (L) 03/29/2019 2206   GFRNONAA 38 (L) 03/06/2019 1119   GFRAA 58 (L) 03/29/2019 2206   GFRAA 44 (L) 03/06/2019 1119       Component Value Date/Time   WBC 9.2 05/31/2019 1157   RBC 4.26 05/31/2019 1157   HGB 11.4 (L) 05/31/2019 1157   HCT 35.3 (L) 05/31/2019 1157   PLT 284.0 05/31/2019 1157   MCV 83.0 05/31/2019 1157   MCH 28.1 03/29/2019 2206   MCHC 32.4 05/31/2019 1157   RDW 14.0 05/31/2019 1157   LYMPHSABS 1.3 05/31/2019 1157   MONOABS 0.8 05/31/2019 1157   EOSABS 0.1 05/31/2019 1157   BASOSABS 0.1 05/31/2019 1157    No results found for: POCLITH, LITHIUM   No results found for: PHENYTOIN, PHENOBARB, VALPROATE, CBMZ   .res Assessment: Plan:   Pt seen for 25 minutes and time spent counseling pt  re: tx options for depression. Counseled patient regarding potential benefits, risks, and side effects of Lamictal to include potential risk of Stevens-Johnson syndrome. Advised patient to stop taking Lamictal and contact office immediately if rash develops and to seek urgent medical attention if rash is severe and/or spreading quickly. Pt agrees to trial of Lamictal. Will start Lamictal 25 mg po qd x 2 weeks, then increase to 50 mg po qd for mood s/s. Discussed continuing Zyprexa 7.5 mg po QHS since it has been helpful for anxiety and insomnia. May consider decrease in Zyprexa in the future based on response to Lamictal. Patient advised to contact office with any questions, adverse effects, or acute worsening in signs and symptoms. Pt to f/u in 4 weeks or sooner if clinically indicated.  Priya was seen today for depression and anxiety.  Diagnoses and all orders for this visit:  Bipolar disorder, current episode mixed, moderate (HCC) -     lamoTRIgine (LAMICTAL) 25 MG tablet; Take 1 tablet (25 mg total) by mouth daily for 14 days, THEN 2 tablets (50 mg total) daily for 14 days. -     OLANZapine (ZYPREXA) 7.5 MG tablet; Take 1 tablet (7.5 mg total) by mouth at  bedtime.  Anxiety disorder, unspecified type -     OLANZapine (ZYPREXA) 7.5 MG tablet; Take 1 tablet (7.5 mg total) by mouth at bedtime.  Insomnia due to other mental disorder -     OLANZapine (ZYPREXA) 7.5 MG tablet; Take 1 tablet (7.5 mg total) by mouth at bedtime.    Please see After Visit Summary for patient specific instructions.  Future Appointments  Date Time Provider [place]  09/26/2019  2:00 PM Thayer Headings, PMHNP [facility] None  11/03/2019  2:45 PM [name], NP [facility] None  02/15/2020  1:45 PM [facility] NURSE [facility] None    No orders of the defined types were placed in this encounter.     -------------------------------

## 2019-09-07 ENCOUNTER — Ambulatory Visit: Payer: Medicare PPO | Attending: Internal Medicine

## 2019-09-07 DIAGNOSIS — Z23 Encounter for immunization: Secondary | ICD-10-CM

## 2019-09-07 NOTE — Progress Notes (Signed)
   Covid-19 Vaccination Clinic  Name:  Megan Brewer    MRN: [id number] DOB: 09-15-1945  09/07/2019  [name] was observed post Covid-19 immunization for 15 minutes without incidence. She was provided with Vaccine Information Sheet and instruction to access the V-Safe system.   [name] was instructed to call 911 with any severe reactions post vaccine: Marland Kitchen Difficulty breathing  . Swelling of your face and throat  . A fast heartbeat  . A bad rash all over your body  . Dizziness and weakness    Immunizations Administered    Name Date Dose VIS Date Route   Pfizer COVID-19 Vaccine 09/07/2019  5:23 PM 0.3 mL 07/28/2019 Intramuscular   Manufacturer: [place]   Lot: [id number]   [place]: [id number]

## 2019-09-13 ENCOUNTER — Other Ambulatory Visit: Payer: Self-pay | Admitting: Nurse Practitioner

## 2019-09-13 DIAGNOSIS — G2581 Restless legs syndrome: Secondary | ICD-10-CM

## 2019-09-25 ENCOUNTER — Encounter: Payer: Self-pay | Admitting: Nurse Practitioner

## 2019-09-25 ENCOUNTER — Other Ambulatory Visit: Payer: Self-pay

## 2019-09-25 ENCOUNTER — Ambulatory Visit (INDEPENDENT_AMBULATORY_CARE_PROVIDER_SITE_OTHER): Payer: Medicare PPO | Admitting: Nurse Practitioner

## 2019-09-25 DIAGNOSIS — Z0000 Encounter for general adult medical examination without abnormal findings: Secondary | ICD-10-CM

## 2019-09-25 NOTE — Patient Instructions (Addendum)
Megan Brewer , Thank you for taking time to come for your Medicare Wellness Visit. I appreciate your ongoing commitment to your health goals. Please review the following plan we discussed and let me know if I can assist you in the future.   Screening recommendations/referrals: Colonoscopy up to date Mammogram up to date Bone Density up to date Recommended yearly ophthalmology/optometry visit for glaucoma screening and checkup Recommended yearly dental visit for hygiene and checkup  Vaccinations: Influenza vaccine up to date Pneumococcal vaccine, recommended calling insurance company and asking what pneumonia vaccine(s) you have had, if you have had one or both to write down and let us know Tdap vaccine up to date Shingles vaccine up to date    Advanced directives: declined  Conditions/risks identified: worsening weakness due to lack of activity, recommending to increase activity slowly but persistently   Next appointment: 1 year   Preventive Care 65 Years and Older, Female Preventive care refers to lifestyle choices and visits with your health care provider that can promote health and wellness. What does preventive care include?  A yearly physical exam. This is also called an annual well check.  Dental exams once or twice a year.  Routine eye exams. Ask your health care provider how often you should have your eyes checked.  Personal lifestyle choices, including:  Daily care of your teeth and gums.  Regular physical activity.  Eating a healthy diet.  Avoiding tobacco and drug use.  Limiting alcohol use.  Practicing safe sex.  Taking low-dose aspirin every day.  Taking vitamin and mineral supplements as recommended by your health care provider. What happens during an annual well check? The services and screenings done by your health care provider during your annual well check will depend on your age, overall health, lifestyle risk factors, and family history of  disease. Counseling  Your health care provider may ask you questions about your:  Alcohol use.  Tobacco use.  Drug use.  Emotional well-being.  Home and relationship well-being.  Sexual activity.  Eating habits.  History of falls.  Memory and ability to understand (cognition).  Work and work Statistician.  Reproductive health. Screening  You may have the following tests or measurements:  Height, weight, and BMI.  Blood pressure.  Lipid and cholesterol levels. These may be checked every 5 years, or more frequently if you are over 58 years old.  Skin check.  Lung cancer screening. You may have this screening every year starting at age 35 if you have a 30-pack-year history of smoking and currently smoke or have quit within the past 15 years.  Fecal occult blood test (FOBT) of the stool. You may have this test every year starting at age 62.  Flexible sigmoidoscopy or colonoscopy. You may have a sigmoidoscopy every 5 years or a colonoscopy every 10 years starting at age 71.  Hepatitis C blood test.  Hepatitis B blood test.  Sexually transmitted disease (STD) testing.  Diabetes screening. This is done by checking your blood sugar (glucose) after you have not eaten for a while (fasting). You may have this done every 1-3 years.  Bone density scan. This is done to screen for osteoporosis. You may have this done starting at age 94.  Mammogram. This may be done every 1-2 years. Talk to your health care provider about how often you should have regular mammograms. Talk with your health care provider about your test results, treatment options, and if necessary, the need for more tests. Vaccines  Your  health care provider may recommend certain vaccines, such as:  Influenza vaccine. This is recommended every year.  Tetanus, diphtheria, and acellular pertussis (Tdap, Td) vaccine. You may need a Td booster every 10 years.  Zoster vaccine. You may need this after age  20.  Pneumococcal 13-valent conjugate (PCV13) vaccine. One dose is recommended after age 40.  Pneumococcal polysaccharide (PPSV23) vaccine. One dose is recommended after age 53. Talk to your health care provider about which screenings and vaccines you need and how often you need them. This information is not intended to replace advice given to you by your health care provider. Make sure you discuss any questions you have with your health care provider. Document Released: 08/30/2015 Document Revised: 04/22/2016 Document Reviewed: 06/04/2015 Elsevier Interactive Patient Education  2017 [place] Prevention in the Home Falls can cause injuries. They can happen to people of all ages. There are many things you can do to make your home safe and to help prevent falls. What can I do on the outside of my home?  Regularly fix the edges of walkways and driveways and fix any cracks.  Remove anything that might make you trip as you walk through a door, such as a raised step or threshold.  Trim any bushes or trees on the path to your home.  Use bright outdoor lighting.  Clear any walking paths of anything that might make someone trip, such as rocks or tools.  Regularly check to see if handrails are loose or broken. Make sure that both sides of any steps have handrails.  Any raised decks and porches should have guardrails on the edges.  Have any leaves, snow, or ice cleared regularly.  Use sand or salt on walking paths during winter.  Clean up any spills in your garage right away. This includes oil or grease spills. What can I do in the bathroom?  Use night lights.  Install grab bars by the toilet and in the tub and shower. Do not use towel bars as grab bars.  Use non-skid mats or decals in the tub or shower.  If you need to sit down in the shower, use a plastic, non-slip stool.  Keep the floor dry. Clean up any water that spills on the floor as soon as it happens.  Remove  soap buildup in the tub or shower regularly.  Attach bath mats securely with double-sided non-slip rug tape.  Do not have throw rugs and other things on the floor that can make you trip. What can I do in the bedroom?  Use night lights.  Make sure that you have a light by your bed that is easy to reach.  Do not use any sheets or blankets that are too big for your bed. They should not hang down onto the floor.  Have a firm chair that has side arms. You can use this for support while you get dressed.  Do not have throw rugs and other things on the floor that can make you trip. What can I do in the kitchen?  Clean up any spills right away.  Avoid walking on wet floors.  Keep items that you use a lot in easy-to-reach places.  If you need to reach something above you, use a strong step stool that has a grab bar.  Keep electrical cords out of the way.  Do not use floor polish or wax that makes floors slippery. If you must use wax, use non-skid floor wax.  Do not  have throw rugs and other things on the floor that can make you trip. What can I do with my stairs?  Do not leave any items on the stairs.  Make sure that there are handrails on both sides of the stairs and use them. Fix handrails that are broken or loose. Make sure that handrails are as long as the stairways.  Check any carpeting to make sure that it is firmly attached to the stairs. Fix any carpet that is loose or worn.  Avoid having throw rugs at the top or bottom of the stairs. If you do have throw rugs, attach them to the floor with carpet tape.  Make sure that you have a light switch at the top of the stairs and the bottom of the stairs. If you do not have them, ask someone to add them for you. What else can I do to help prevent falls?  Wear shoes that:  Do not have high heels.  Have rubber bottoms.  Are comfortable and fit you well.  Are closed at the toe. Do not wear sandals.  If you use a  stepladder:  Make sure that it is fully opened. Do not climb a closed stepladder.  Make sure that both sides of the stepladder are locked into place.  Ask someone to hold it for you, if possible.  Clearly mark and make sure that you can see:  Any grab bars or handrails.  First and last steps.  Where the edge of each step is.  Use tools that help you move around (mobility aids) if they are needed. These include:  Canes.  Walkers.  Scooters.  Crutches.  Turn on the lights when you go into a dark area. Replace any light bulbs as soon as they burn out.  Set up your furniture so you have a clear path. Avoid moving your furniture around.  If any of your floors are uneven, fix them.  If there are any pets around you, be aware of where they are.  Review your medicines with your doctor. Some medicines can make you feel dizzy. This can increase your chance of falling. Ask your doctor what other things that you can do to help prevent falls. This information is not intended to replace advice given to you by your health care provider. Make sure you discuss any questions you have with your health care provider. Document Released: 05/30/2009 Document Revised: 01/09/2016 Document Reviewed: 09/07/2014 Elsevier Interactive Patient Education  2017 [employer].

## 2019-09-25 NOTE — Progress Notes (Signed)
   This service is provided via telemedicine  No vital signs collected/recorded due to the encounter was a telemedicine visit.   Location of patient (ex: home, work):  Home   Patient consents to a telephone visit:  Yes  Location of the provider (ex: office, home):  [facility], Office   Name of any referring provider:  N/A  Names of all persons participating in the telemedicine service and their role in the encounter:  S.Chrae B/CMA, Sherrie Mustache, NP, and Patient   Time spent on call:  9 min with medical assistant

## 2019-09-25 NOTE — Progress Notes (Signed)
Subjective:   Siniyah Evangelist is a 74 y.o. female who presents for Medicare Annual (Subsequent) preventive examination.  Review of Systems:   Cardiac Risk Factors include: family history of premature cardiovascular disease;advanced age (>57men, >85 women);sedentary lifestyle     Objective:     Vitals: There were no vitals taken for this visit.  There is no height or weight on file to calculate BMI.  Advanced Directives 09/25/2019 07/05/2019 04/03/2019 03/29/2019 03/16/2019 03/06/2019 02/27/2019  Does Patient Have a Medical Advance Directive? No No No No No No No  Type of Advance Directive - - - - - - -  Copy of Healthcare Power of Attorney in Chart? - - - - - - -  Would patient like information on creating a medical advance directive? No - Patient declined - No - Patient declined No - Patient declined No - Patient declined Yes (ED - Information included in AVS) No - Patient declined    Tobacco Social History   Tobacco Use  Smoking Status Never Smoker  Smokeless Tobacco Never Used     Counseling given: Not Answered   Clinical Intake:  Pre-visit preparation completed: Yes  Pain : No/denies pain     BMI - recorded: 23.17 Nutritional Status: BMI of 19-24  Normal Nutritional Risks: None Diabetes: No  How often do you need to have someone help you when you read instructions, pamphlets, or other written materials from your doctor or pharmacy?: 1 - Never What is the last grade level you completed in school?: post graduate school  Interpreter Needed?: No     Past Medical History:  Diagnosis Date  . Arachnoiditis   . Bipolar disorder ([place])   . Chronic back pain   . Chronic pain   . Chronic post-thoracotomy pain   . CKD (chronic kidney disease)   . GERD (gastroesophageal reflux disease)   . Hypogammaglobulinemia ([place])   . Hypotension   . Hypothyroid   . 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 Lung cancer ([place]) 2011, 2014  . Memory loss   . Migraine   .  Opioid abuse ([place])   . Osteoporosis   . Presence of neurostimulator   . Recurrent upper respiratory infection (URI)   . Restless legs   . Sleep apnea   . Tremor   . Urge incontinence   . Urticaria    long time ago, nothing recent   Past Surgical History:  Procedure Laterality Date  . ABDOMINAL HYSTERECTOMY    . CARPAL TUNNEL RELEASE    . CATARACT EXTRACTION Bilateral 2019  . CHOLECYSTECTOMY    . COLONOSCOPY  2015   [facility]  . CT LUNG SCREENING  2018  . DG  BONE DENSITY ([place] HX)  2018  . DIAGNOSTIC MAMMOGRAM  2019  . GASTRIC BYPASS    . LUNG CANCER SURGERY  02/2010  . SPINAL CORD STIMULATOR IMPLANT     Family History  Problem Relation Age of Onset  . Hypertension Mother   . GER disease Mother   . Dementia Mother   . Osteoporosis Mother   . Arthritis Mother   . Bipolar disorder Mother   . Parkinson's disease Father   . Congestive Heart Failure Father   . Pneumonia Father   . Arthritis Father   . Dementia Father   . Depression Father   . Asthma Sister   . Depression Sister   . Bipolar disorder Brother   . Asthma Daughter    Social History   Socioeconomic  History  . Marital status: Married    Spouse name: Not on file  . Number of children: Not on file  . Years of education: Not on file  . Highest education level: Not on file  Occupational History  . Not on file  Tobacco Use  . Smoking status: Never Smoker  . Smokeless tobacco: Never Used  Substance and Sexual Activity  . Alcohol use: Not Currently  . Drug use: Never  . Sexual activity: Not on file  Other Topics Concern  . Not on file  Social History Narrative   Diet: None      Caffeine: coffee, tea, sodas less than or 1 daily.      Married, if yes what year: Yes, 1972      Do you live in a house, apartment, assisted living, condo, trailer, ect: Two story house       Pets: None      Current/Past profession: Teach      Exercise: None         Living Will: No   DNR: No   POA/HPOA: No       Functional Status:   Do you have difficulty bathing or dressing yourself? yes   Do you have difficulty preparing food or eating? yes   Do you have difficulty managing your medications? yes   Do you have difficulty managing your finances?yes   Do you have difficulty affording your medications? no   Social Determinants of Health   Financial Resource Strain:   . Difficulty of Paying Living Expenses: Not on file  Food Insecurity:   . Worried About Charity fundraiser in the Last Year: Not on file  . Ran Out of Food in the Last Year: Not on file  Transportation Needs:   . Lack of Transportation (Medical): Not on file  . Lack of Transportation (Non-Medical): Not on file  Physical Activity:   . Days of Exercise per Week: Not on file  . Minutes of Exercise per Session: Not on file  Stress:   . Feeling of Stress : Not on file  Social Connections:   . Frequency of Communication with Friends and Family: Not on file  . Frequency of Social Gatherings with Friends and Family: Not on file  . Attends Religious Services: Not on file  . Active Member of Clubs or Organizations: Not on file  . Attends Archivist Meetings: Not on file  . Marital Status: Not on file    Outpatient Encounter Medications as of 09/25/2019  Medication Sig  . acetaminophen (TYLENOL) 650 MG CR tablet Take 1,300 mg by mouth every 8 (eight) hours as needed for pain or fever.   Marland Kitchen azelastine (ASTELIN) 0.1 % nasal spray SMARTSIG:1 Spray(s) Both Nares 1 to 2 Times Daily PRN  . Buprenorphine HCl-Naloxone HCl (SUBOXONE) 4-1 MG FILM Place 1 Film under the tongue daily.   . Calcium Citrate 200 MG TABS Take 1 tablet by mouth daily.   Marland Kitchen CRANBERRY PO Take by mouth.  . cycloSPORINE (RESTASIS) 0.05 % ophthalmic emulsion Place 1 drop into both eyes 2 (two) times daily.  . D-MANNOSE PO Take by mouth.  . dexlansoprazole (DEXILANT) 60 MG capsule Take 1 capsule (60 mg total) by mouth daily. Crack open the capsule and pour into spoon  prior to ingestion  . Erenumab-aooe (AIMOVIG ) Inject into the skin every 30 (thirty) days.   . ferrous sulfate 325 (65 FE) MG tablet Take 325 mg by mouth 2 (two)  times daily with a meal.  . immune globulin, human, (GAMMAGARD S/D LESS IGA) 10 g injection Inject into the vein every 21 ( twenty-one) days.   Marland Kitchen lamoTRIgine (LAMICTAL) 25 MG tablet Take 1 tablet (25 mg total) by mouth daily for 14 days, THEN 2 tablets (50 mg total) daily for 14 days.  Marland Kitchen LASTACAFT 0.25 % SOLN Apply 1 drop to eye daily.  Marland Kitchen levothyroxine (SYNTHROID) 75 MCG tablet Take 1 tablet (75 mcg total) by mouth daily before breakfast.  . magnesium oxide (MAG-OX) 400 MG tablet Take 400 mg by mouth daily.   Marland Kitchen OLANZapine (ZYPREXA) 7.5 MG tablet Take 1 tablet (7.5 mg total) by mouth at bedtime.  . potassium chloride SA (K-DUR) 20 MEQ tablet TAKE 2 TABLETS BY MOUTH DAILY (MAY DISSOLVE IN WATER)  . rOPINIRole (REQUIP) 0.25 MG tablet TAKE 1 TABLET BY MOUTH AT BEDTIME  . tiZANidine (ZANAFLEX) 2 MG tablet Take 2 mg by mouth 3 (three) times daily as needed for muscle spasms.   Marland Kitchen torsemide (DEMADEX) 20 MG tablet Take 20 mg by mouth daily.   . [DISCONTINUED] amoxicillin (AMOXIL) 500 MG capsule Take 1 capsule (500 mg total) by mouth 3 (three) times daily. (Patient not taking: Reported on 08/29/2019)  . [DISCONTINUED] Azelastine-Fluticasone 137-50 MCG/ACT SUSP 1 spray per nostril twice a day as needed   No facility-administered encounter medications on file as of 09/25/2019.    Activities of Daily Living In your present state of health, do you have any difficulty performing the following activities: 09/25/2019  Hearing? N  Vision? N  Difficulty concentrating or making decisions? N  Walking or climbing stairs? N  Dressing or bathing? N  Doing errands, shopping? Y  Comment does not Physiological scientist and eating ? N  Using the Toilet? N  In the past six months, have you accidently leaked urine? Y  Do you have problems with loss of bowel  control? N  Managing your Medications? N  Managing your Finances? N  Housekeeping or managing your Housekeeping? N  Some recent data might be hidden    Patient Care Team: [name], NP as PCP - General (Geriatric Medicine)    Assessment:   This is a routine wellness examination for Svetlana.  Exercise Activities and Dietary recommendations Current Exercise Habits: The patient has a physically strenuous job, but has no regular exercise apart from work.  Goals   None     Fall Risk Fall Risk  09/25/2019 07/25/2019 07/05/2019 04/03/2019 03/14/2019  Falls in the past year? 0 0 0 1 0  Number falls in past yr: 0 0 - 0 0  Injury with Fall? 0 0 0 0 0   Is the patient's home free of loose throw rugs in walkways, pet beds, electrical cords, etc?   yes      Grab bars in the bathroom? yes      Handrails on the stairs?   yes      Adequate lighting?   yes  Timed Get Up and Go performed: na  Depression Screen PHQ 2/9 Scores 09/25/2019 07/05/2019 01/27/2019  PHQ - 2 Score 0 0 -  Exception Documentation (No Data) - Medical reason     Cognitive Function     6CIT Screen 09/25/2019  What Year? 0 points  What month? 0 points  What time? 0 points  Count back from 20 0 points  Months in reverse 0 points  Repeat phrase 2 points  Total Score 2  Immunization History  Administered Date(s) Administered  . Fluad Quad(high Dose 65+) 06/06/2019  . Influenza, Seasonal, Injecte, Preservative Fre 11/14/2012  . Influenza,inj,Quad PF,6+ Mos 05/24/2018  . Influenza-Unspecified 04/17/2016, 06/09/2017  . PFIZER SARS-COV-2 Vaccination 09/07/2019  . Pneumococcal Conjugate-13 07/29/2017  . Pneumococcal Polysaccharide-23 07/29/2017  . Tdap 05/30/2017  . Zoster Recombinat (Shingrix) 06/17/2017    Qualifies for Shingles Vaccine?yes, done  Screening Tests Health Maintenance  Topic Date Due  . Hepatitis C Screening  12/19/1945  . PNA vac Low Risk Adult (2 of 2 - PCV13) 07/29/2018  . MAMMOGRAM   02/12/2021  . COLONOSCOPY  02/02/2024  . TETANUS/TDAP  05/31/2027  . INFLUENZA VACCINE  Completed  . DEXA SCAN  Completed    Cancer Screenings: Lung: Low Dose CT Chest recommended if Age 27-80 years, 30 pack-year currently smoking OR have quit w/in 15years. Patient does not qualify. Breast:  Up to date on Mammogram? Yes   Up to date of Bone Density/Dexa? Yes Colorectal: up to date  Additional Screenings:  Hepatitis C Screening: agreeable to have done at next in office visit.      Plan:      I have personally reviewed and noted the following in the patient's chart:   . Medical and social history . Use of alcohol, tobacco or illicit drugs  . Current medications and supplements . Functional ability and status . Nutritional status . Physical activity . Advanced directives . List of other physicians . Hospitalizations, surgeries, and ER visits in previous 12 months . Vitals . Screenings to include cognitive, depression, and falls . Referrals and appointments  In addition, I have reviewed and discussed with patient certain preventive protocols, quality metrics, and best practice recommendations. A written personalized care plan for preventive services as well as general preventive health recommendations were provided to patient.     [name], NP  09/25/2019

## 2019-09-26 ENCOUNTER — Ambulatory Visit: Payer: Medicare PPO | Admitting: Psychiatry

## 2019-09-26 ENCOUNTER — Telehealth: Payer: Self-pay | Admitting: Psychiatry

## 2019-09-26 ENCOUNTER — Other Ambulatory Visit: Payer: Self-pay | Admitting: Psychiatry

## 2019-09-26 DIAGNOSIS — F3162 Bipolar disorder, current episode mixed, moderate: Secondary | ICD-10-CM

## 2019-09-26 NOTE — Telephone Encounter (Signed)
Pt requesting a refill on the Lamictal and Zyprexa to be filled at the [facility]. Appt rescheduled for 2/22 due to provider out today.

## 2019-09-27 ENCOUNTER — Other Ambulatory Visit: Payer: Self-pay

## 2019-09-27 NOTE — Telephone Encounter (Signed)
Noted thanks °

## 2019-09-28 ENCOUNTER — Ambulatory Visit: Payer: Medicare PPO | Attending: Internal Medicine

## 2019-09-28 DIAGNOSIS — Z23 Encounter for immunization: Secondary | ICD-10-CM | POA: Insufficient documentation

## 2019-09-28 NOTE — Progress Notes (Signed)
   Covid-19 Vaccination Clinic  Name:  Megan Brewer    MRN: [id number] DOB: [birth year]  09/28/2019  Megan Brewer was observed post Covid-19 immunization for 15 minutes without incidence. She was provided with Vaccine Information Sheet and instruction to access the V-Safe system.   Megan Brewer was instructed to call 911 with any severe reactions post vaccine: Marland Kitchen Difficulty breathing  . Swelling of your face and throat  . A fast heartbeat  . A bad rash all over your body  . Dizziness and weakness    Immunizations Administered    Name Date Dose VIS Date Route   Pfizer COVID-19 Vaccine 09/28/2019  4:53 PM 0.3 mL 07/28/2019 Intramuscular   Manufacturer: [place]   Lot: [id number]   NDC: [id number]

## 2019-10-02 ENCOUNTER — Ambulatory Visit (INDEPENDENT_AMBULATORY_CARE_PROVIDER_SITE_OTHER): Payer: Medicare PPO | Admitting: Family

## 2019-10-02 ENCOUNTER — Other Ambulatory Visit: Payer: Self-pay

## 2019-10-02 ENCOUNTER — Encounter: Payer: Self-pay | Admitting: Family

## 2019-10-02 VITALS — Ht 63.0 in | Wt 130.0 lb

## 2019-10-02 DIAGNOSIS — G2581 Restless legs syndrome: Secondary | ICD-10-CM

## 2019-10-02 DIAGNOSIS — G4701 Insomnia due to medical condition: Secondary | ICD-10-CM | POA: Diagnosis not present

## 2019-10-02 MED ORDER — ROPINIROLE HCL 0.5 MG PO TABS: 0.2500 mg | ORAL_TABLET | Freq: Two times a day (BID) | ORAL | 3 refills | Status: DC

## 2019-10-02 NOTE — Progress Notes (Signed)
This service is provided via telemedicine  Vitals were collected/recorded during the telemedicine encounter and recorded by [place].D RMA.  Location of patient (ex: home, work):  Home  Patient consents to a telephone visit:  Yes  Location of the provider (ex: office, home):   [place]   Name of any referring provider: N/A  Names of all persons participating in the telemedicine service and their role in the encounter:      Patient, Megan Brewer, Megan Brewer, Marlowe Sax, NP.   Time spent on call:  8 minutes spent on phone with Medical Assistant.   Provider: Rachal Dvorsky FNP-C  [name], NP  Patient Care Team: [name], NP as PCP - General (Geriatric Medicine)  Extended Emergency Contact Information Primary Emergency Contact: [facility] Address: [address] [place] of [place] Phone: [phone] Mobile Phone: [phone] Relation: Spouse  Code Status:  Full Code  Goals of care: Advanced Directive information Advanced Directives 10/02/2019  Does Patient Have a Medical Advance Directive? No  Type of Advance Directive -  Copy of [place] in Chart? -  Would patient like information on creating a medical advance directive? -     Chief Complaint  Patient presents with  . Acute Visit    Leg Pain/ Trouble Sleeping     HPI:  Pt is a 74 y.o. female seen today for an acute visit for evaluation of Restless leg syndrome worsen one week ago.Has taken ropinirole 0.25 mg tablet states took a total of 6 tablets.Symptoms usually starts around 7 pm until 6 am.During the day RLS  Feels like it's  " waiting beneath the skin to break free". Unable to sleep at night.she walks at night to relieve symptoms.Has tingling on both legs but no numbness.Legs feel wobbly. She is status post spinal Cord stimulator implant 09/27/2019 done by Dr.Kapural Leonardo at [facility].      Past Medical History:  Diagnosis Date  . Arachnoiditis   . Bipolar disorder ([place])   . Chronic back pain   . Chronic pain   . Chronic post-thoracotomy pain   . CKD (chronic kidney disease)   . GERD (gastroesophageal reflux disease)   . Hypogammaglobulinemia ([place])   . Hypotension   . Hypothyroid   . 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 Lung cancer ([place]) 2011, 2014  . Memory loss   . Migraine   . Opioid abuse ([place])   . Osteoporosis   . Presence of neurostimulator   . Recurrent upper respiratory infection (URI)   . Restless legs   . Sleep apnea   . Tremor   . Urge incontinence   . Urticaria    long time ago, nothing recent   Past Surgical History:  Procedure Laterality Date  . ABDOMINAL HYSTERECTOMY    . CARPAL TUNNEL RELEASE    . CATARACT EXTRACTION Bilateral 2019  . CHOLECYSTECTOMY    . COLONOSCOPY  2015   [facility]  . CT LUNG SCREENING  2018  . DG  BONE DENSITY ([place] HX)  2018  . DIAGNOSTIC MAMMOGRAM  2019  . GASTRIC BYPASS    . LUNG CANCER SURGERY  02/2010  . SPINAL CORD STIMULATOR IMPLANT      Allergies  Allergen Reactions  . Tetracycline Nausea Only    Causes ringing in ears, dizziness, migraine, nausea  . Corticosteroids Other (See Comments)  12/02/2016 interview by Melissa Montane: Oral dosing causes migraines/nausea/tinnitus. Prev tolerated IV and intranasal admin w/o difficulty.  . Cefixime Other (See Comments)    Headache   . Ciprofloxacin Other (See Comments)    Headache, dizziness, ringing in the ears  . Doxycycline Other (See Comments)    unknown  . Gabapentin     Throat swells/closes  . Isometheptene-Dichloral-Apap Other (See Comments)    unknown  . Ketorolac     12/02/2016 interview by FUX: Oral dosing prednisone/corticosteroids causes migraines/nausea/tinnitus. Prev tolerated IV and intranasal admin w/o difficulty.  . Prednisone Other (See Comments)    Migraine, dizzy, ringing in ears-can take it IV 12/02/2016 interview by [name]: Oral  prednisone dosing causes migraines/nausea/tinnitus. Prev tolerated IV and intranasal admin w/o difficulty.  . Promethazine Other (See Comments)    causes severe abdominal pain when taken IV; however she can take PO or IM  . Stadol [Butorphanol]     Cerebral pain  . Erythromycin Base Diarrhea and Itching    Severe diarrhea  . Propoxyphene Nausea Only    Outpatient Encounter Medications as of 10/02/2019  Medication Sig  . acetaminophen (TYLENOL) 650 MG CR tablet Take 1,300 mg by mouth every 8 (eight) hours as needed for pain or fever.   Marland Kitchen azelastine (ASTELIN) 0.1 % nasal spray SMARTSIG:1 Spray(s) Both Nares 1 to 2 Times Daily PRN  . Calcium Citrate 200 MG TABS Take 1 tablet by mouth daily.   Marland Kitchen CRANBERRY PO Take by mouth.  . cycloSPORINE (RESTASIS) 0.05 % ophthalmic emulsion Place 1 drop into both eyes 2 (two) times daily.  . D-MANNOSE PO Take by mouth.  . dexlansoprazole (DEXILANT) 60 MG capsule Take 1 capsule (60 mg total) by mouth daily. Crack open the capsule and pour into spoon prior to ingestion  . Erenumab-aooe (AIMOVIG [place]) Inject into the skin every 30 (thirty) days.   . ferrous sulfate 325 (65 FE) MG tablet Take 325 mg by mouth 2 (two) times daily with a meal.  . immune globulin, human, (GAMMAGARD S/D LESS IGA) 10 g injection Inject into the vein every 21 ( twenty-one) days.   Marland Kitchen lamoTRIgine (LAMICTAL) 25 MG tablet Take 2 tablets (50 mg total) by mouth daily.  Marland Kitchen LASTACAFT 0.25 % SOLN Apply 1 drop to eye daily.  Marland Kitchen levothyroxine (SYNTHROID) 75 MCG tablet Take 1 tablet (75 mcg total) by mouth daily before breakfast.  . magnesium oxide (MAG-OX) 400 MG tablet Take 400 mg by mouth daily.   . potassium chloride SA (K-DUR) 20 MEQ tablet TAKE 2 TABLETS BY MOUTH DAILY (MAY DISSOLVE IN WATER)  . tiZANidine (ZANAFLEX) 2 MG tablet Take 2 mg by mouth 3 (three) times daily as needed for muscle spasms.   Marland Kitchen torsemide (DEMADEX) 20 MG tablet Take 20 mg by mouth daily.   Marland Kitchen OLANZapine (ZYPREXA) 7.5 MG  tablet Take 1 tablet (7.5 mg total) by mouth at bedtime.  . [DISCONTINUED] Buprenorphine HCl-Naloxone HCl (SUBOXONE) 4-1 MG FILM Place 1 Film under the tongue daily.   . [DISCONTINUED] rOPINIRole (REQUIP) 0.25 MG tablet TAKE 1 TABLET BY MOUTH AT BEDTIME   No facility-administered encounter medications on file as of 10/02/2019.    Review of Systems  Constitutional: Negative for appetite change, chills, fatigue and fever.  Respiratory: Negative for cough, chest tightness, shortness of breath and wheezing.   Cardiovascular: Negative for chest pain and palpitations.       Chronic leg swelling   Gastrointestinal: Negative for abdominal distention, abdominal pain, constipation, diarrhea, nausea  and vomiting.  Musculoskeletal: Positive for arthralgias and back pain. Negative for myalgias.       Neuro-stimulantor install 09/27/2019   Skin: Negative for color change, pallor and rash.  Neurological: Negative for dizziness, speech difficulty, weakness, light-headedness and numbness.  Psychiatric/Behavioral: Positive for sleep disturbance. Negative for agitation, confusion, self-injury and suicidal ideas. The patient is not nervous/anxious.        Unable to sleep due to RLS     Immunization History  Administered Date(s) Administered  . Fluad Quad(high Dose 65+) 06/06/2019  . Influenza, Seasonal, Injecte, Preservative Fre 11/14/2012  . Influenza,inj,Quad PF,6+ Mos 05/24/2018  . Influenza-Unspecified 04/17/2016, 06/09/2017  . PFIZER SARS-COV-2 Vaccination 09/07/2019, 09/28/2019  . Pneumococcal Conjugate-13 07/29/2017  . Pneumococcal Polysaccharide-23 07/29/2017  . Tdap 05/30/2017  . Zoster Recombinat (Shingrix) 06/17/2017   Pertinent  Health Maintenance Due  Topic Date Due  . PNA vac Low Risk Adult (2 of 2 - PCV13) 07/29/2018  . MAMMOGRAM  02/12/2021  . COLONOSCOPY  02/02/2024  . INFLUENZA VACCINE  Completed  . DEXA SCAN  Completed   Fall Risk  09/25/2019 07/25/2019 07/05/2019 04/03/2019  03/14/2019  Falls in the past year? 0 0 0 1 0  Number falls in past yr: 0 0 - 0 0  Injury with Fall? 0 0 0 0 0    Vitals:   10/02/19 0944  Weight: 130 lb (59 kg)  Height: 5\' 3"  (1.6 m)   Body mass index is 23.03 kg/m. Physical Exam Unable to complete on Telephone visit.   Labs reviewed: Recent Labs    02/27/19 1745 03/06/19 1119 03/06/19 1624 03/16/19 2222 03/29/19 2206  NA 142   < > 137 138 135  K 4.1   < > 4.3 4.5 4.2  CL 102   < > 101 103 100  CO2 29   < > 26 26 27   GLUCOSE 83   < > 93 102* 113*  BUN 30*   < > 34* 38* 32*  CREATININE 1.56*   < > 1.38* 1.13* 1.12*  CALCIUM 9.3   < > 8.8* 8.7* 9.1  MG 2.0  --   --   --   --    < > = values in this interval not displayed.   Recent Labs    02/27/19 1745 03/06/19 1624 03/16/19 2222  AST 23 22 24   ALT 17 15 14   ALKPHOS 60 51 53  BILITOT 0.3 0.3 0.3  PROT 6.5 6.7 6.2*  ALBUMIN 3.5 3.4* 3.4*   Recent Labs    03/06/19 1624 03/06/19 1624 03/16/19 2222 03/29/19 2206 05/31/19 1157  WBC 4.9   < > 4.3 2.9* 9.2  NEUTROABS 3.4  --   --  1.5* 7.0  HGB 10.6*   < > 9.9* 9.6* 11.4*  HCT 33.6*   < > 32.3* 30.2* 35.3*  MCV 88.9   < > 90.0 88.3 83.0  PLT 139*   < > 159 155 284.0   < > = values in this interval not displayed.   Lab Results  Component Value Date   TSH 4.58 (H) 01/27/2019   No results found for: HGBA1C Lab Results  Component Value Date   CHOL  01/27/2010    144        ATP III CLASSIFICATION:  <200     mg/dL   Desirable  200-239  mg/dL   Borderline High  >=240    mg/dL   High  HDL 58 01/27/2010   LDLCALC  01/27/2010    72        Total Cholesterol/HDL:CHD Risk Coronary Heart Disease Risk Table                     Men   Women  1/2 Average Risk   3.4   3.3  Average Risk       5.0   4.4  2 X Average Risk   9.6   7.1  3 X Average Risk  23.4   11.0        Use the calculated Patient Ratio above and the CHD Risk Table to determine the patient's CHD Risk.        ATP III CLASSIFICATION  (LDL):  <100     mg/dL   Optimal  100-129  mg/dL   Near or Above                    Optimal  130-159  mg/dL   Borderline  160-189  mg/dL   High  >190     mg/dL   Very High   TRIG 68 01/27/2010   CHOLHDL 2.5 01/27/2010    Significant Diagnostic Results in last 30 days:  No results found.  Assessment/Plan 1. RLS (restless legs syndrome) Symptoms have worsen in the past one week.took six tablets of Requip without any relief.Advised to take medication as directed.will increase Ropinirole from 0.25 mg tablet at bedtime to 0.5 mg tablet twice daily in the morning and at bedtime.  - continue on Magnesium supplement.  - Increase fluid intake  - encouraged to exercise by walking during the day. - Additional education information provided on AVS   - rOPINIRole (REQUIP) 0.5 MG tablet; Take 0.5 tablets (0.25 mg total) by mouth 2 (two) times daily.  Dispense: 60 tablet; Refill: 3 - Ambulatory referral to Neurology for worsening RLS.   2. Insomnia  Unable to sleep due to her RLS.recommended Melatonin 3 mg tablet one by mouth daily at bedtime but states nothing works for her.  Family/ staff Communication: Reviewed plan of care with patient verbalized understanding.   Labs/tests ordered: None    Next Appointment: Has up coming appointment with PCP 11/03/2019   Spent 12 minutes of non-face to face with patient   Sandrea Hughs, NP

## 2019-10-02 NOTE — Patient Instructions (Addendum)
- Increase Ropinirole from 0.25 mg tablet at bedtime to 0.5 mg tablet twice daily in the morning and at bedtime.  - Urgent Referral placed for Neurologist.Specialist office will call you for appointment.    Restless Legs Syndrome Restless legs syndrome is a condition that causes uncomfortable feelings or sensations in the legs, especially while sitting or lying down. The sensations usually cause an overwhelming urge to move the legs. The arms can also sometimes be affected. The condition can range from mild to severe. The symptoms often interfere with a person's ability to sleep. What are the causes? The cause of this condition is not known. What increases the risk? The following factors may make you more likely to develop this condition:  Being older than 50.  Pregnancy.  Being a woman. In general, the condition is more common in women than in men.  A family history of the condition.  Having iron deficiency.  Overuse of caffeine, nicotine, or alcohol.  Certain medical conditions, such as kidney disease, Parkinson's disease, or nerve damage.  Certain medicines, such as those for high blood pressure, nausea, colds, allergies, depression, and some heart conditions. What are the signs or symptoms? The main symptom of this condition is uncomfortable sensations in the legs, such as:  Pulling.  Tingling.  Prickling.  Throbbing.  Crawling.  Burning. Usually, the sensations:  Affect both sides of the body.  Are worse when you sit or lie down.  Are worse at night. These may wake you up or make it difficult to fall asleep.  Make you have a strong urge to move your legs.  Are temporarily relieved by moving your legs. The arms can also be affected, but this is rare. People who have this condition often have tiredness during the day because of their lack of sleep at night. How is this diagnosed? This condition may be diagnosed based on:  Your symptoms.  Blood tests. In  some cases, you may be monitored in a sleep lab by a specialist (a sleep study). This can detect any disruptions in your sleep. How is this treated? This condition is treated by managing the symptoms. This may include:  Lifestyle changes, such as exercising, using relaxation techniques, and avoiding caffeine, alcohol, or tobacco.  Medicines. Anti-seizure medicines may be tried first. Follow these instructions at home:     General instructions  Take over-the-counter and prescription medicines only as told by your health care provider.  Use methods to help relieve the uncomfortable sensations, such as: ? Massaging your legs. ? Walking or stretching. ? Taking a cold or hot bath.  Keep all follow-up visits as told by your health care provider. This is important. Lifestyle  Practice good sleep habits. For example, go to bed and get up at the same time every day. Most adults should get 7-9 hours of sleep each night.  Exercise regularly. Try to get at least 30 minutes of exercise most days of the week.  Practice ways of relaxing, such as yoga or meditation.  Avoid caffeine and alcohol.  Do not use any products that contain nicotine or tobacco, such as cigarettes and e-cigarettes. If you need help quitting, ask your health care provider. Contact a health care provider if:  Your symptoms get worse or they do not improve with treatment. Summary  Restless legs syndrome is a condition that causes uncomfortable feelings or sensations in the legs, especially while sitting or lying down.  The symptoms often interfere with a person's ability to sleep.  This condition is treated by managing the symptoms. You may need to make lifestyle changes or take medicines. This information is not intended to replace advice given to you by your health care provider. Make sure you discuss any questions you have with your health care provider. Document Revised: 08/23/2017 Document Reviewed:  08/23/2017 Elsevier Patient Education  [place].

## 2019-10-04 ENCOUNTER — Encounter: Payer: Self-pay | Admitting: Neurology

## 2019-10-09 ENCOUNTER — Encounter: Payer: Self-pay | Admitting: Psychiatry

## 2019-10-09 ENCOUNTER — Ambulatory Visit (INDEPENDENT_AMBULATORY_CARE_PROVIDER_SITE_OTHER): Payer: Medicare PPO | Admitting: Psychiatry

## 2019-10-09 VITALS — Wt 138.0 lb

## 2019-10-09 DIAGNOSIS — F99 Mental disorder, not otherwise specified: Secondary | ICD-10-CM | POA: Diagnosis not present

## 2019-10-09 DIAGNOSIS — F5105 Insomnia due to other mental disorder: Secondary | ICD-10-CM | POA: Diagnosis not present

## 2019-10-09 DIAGNOSIS — F3162 Bipolar disorder, current episode mixed, moderate: Secondary | ICD-10-CM | POA: Diagnosis not present

## 2019-10-09 DIAGNOSIS — F419 Anxiety disorder, unspecified: Secondary | ICD-10-CM | POA: Diagnosis not present

## 2019-10-09 MED ORDER — OLANZAPINE 7.5 MG PO TABS: ORAL_TABLET | ORAL | 1 refills | Status: DC

## 2019-10-09 MED ORDER — ASENAPINE MALEATE 2.5 MG SL SUBL: 2.5000 mg | SUBLINGUAL_TABLET | Freq: Two times a day (BID) | SUBLINGUAL | 0 refills | Status: DC

## 2019-10-09 MED ORDER — LAMOTRIGINE 25 MG PO TABS: 50.0000 mg | ORAL_TABLET | Freq: Every day | ORAL | 1 refills | Status: DC

## 2019-10-09 MED ORDER — ASENAPINE MALEATE 5 MG SL SUBL: 5.0000 mg | SUBLINGUAL_TABLET | Freq: Every day | SUBLINGUAL | 0 refills | Status: DC

## 2019-10-09 NOTE — Progress Notes (Signed)
Megan Brewer [id number] [birth year] 74 y.o.  Virtual Visit via Telephone Note  I connected with pt on 10/09/19 at 10:00 AM EST by telephone and verified that I am speaking with the correct person using two identifiers.   I discussed the limitations, risks, security and privacy concerns of performing an evaluation and management service by telephone and the availability of in person appointments. I also discussed with the patient that there may be a patient responsible charge related to this service. The patient expressed understanding and agreed to proceed.   I discussed the assessment and treatment plan with the patient. The patient was provided an opportunity to ask questions and all were answered. The patient agreed with the plan and demonstrated an understanding of the instructions.   The patient was advised to call back or seek an in-person evaluation if the symptoms worsen or if the condition fails to improve as anticipated.  I provided 30 minutes of non-face-to-face time during this encounter.  The patient was located at home.  The provider was located at [place].   Thayer Headings, PMHNP   Subjective:   Patient ID:  Megan Brewer is a 74 y.o. (DOB 1945/12/19) female.  Chief Complaint:  Chief Complaint  Patient presents with  . Medication Problem    Wt gain, compulsive eating    HPI Megan Brewer presents for follow-up of mood, anxiety, and insomnia. She reports that her anxiety is "better but not completely gone." Reports having some mild panic s/s. She reports that she was recently able to cry at the end of a movie for the first time in awhile. Still unable to laugh. Denies depressed mood- "I don't feel much of anything." Sleep has been fair. Reports awakening 2-3 times a night and has difficulty returning to sleep and estimates sleeping about 6 hours total. Appetite has been "out of control" and reports continued wt gain. Now weighing 138 lbs and  had been 105 lbs. Reports that she is "struggling not to eat" and cannot feel satiated. Energy and motivation have been improved, with the exception of physical limitations. Concentration varies, possibly in response to sleep, pain, and boredom. She reports that she continues to have an extreme impulse to spend money. Reports that she recently bought a rug and a sweats outfit. Reports that she has been eating impulsively and eating quickly- "almost an obsession." She reports that excessive eating has increased since starting lamictal. Denies SI.   Reports that she is now off suboxone and had to stop it abruptly about 2 weeks ago prior to her surgery.   She reports that her legs and hands have been shaking and interfering with daily tasks, such as preparing a sandwich. Reports RLS has improved with spinal stimulator.   Had back surgery 10 days ago and has had some post-operative pain. Had spinal stimulator placed.   Past Psychiatric Medication Trials: Sertraline- Was effective for about 25 years and no longer seems to be as effective.  Prozac Paxil  Cymbalta- may have caused psychosis. Depakote Gabapentin Lithium- Edema. Minimally effective Seroquel Risperidone Zyprexa- Effective for insomnia, anxiety, and mood. Excessive wt gain.  Melatonin Ativan- Reports taking until 2 months ago.  Valium- "great for my depression." Xanax Trazodone Requip- Took for years at 0.5 mg po qd. Trileptal- shaking and night sweats  Review of Systems:  Review of Systems  Musculoskeletal: Positive for back pain. Negative for gait problem.       Post-operative pain  Neurological: Positive for tremors.  Denies any other involuntary movements  Psychiatric/Behavioral:       Please refer to HPI    Medications: I have reviewed the patient's current medications.  Current Outpatient Medications  Medication Sig Dispense Refill  . acetaminophen (TYLENOL) 650 MG CR tablet Take 1,300 mg by mouth every 8  (eight) hours as needed for pain or fever.     Marland Kitchen azelastine (ASTELIN) 0.1 % nasal spray SMARTSIG:1 Spray(s) Both Nares 1 to 2 Times Daily PRN    . Calcium Citrate 200 MG TABS Take 1 tablet by mouth daily.     Marland Kitchen CRANBERRY PO Take by mouth.    . cycloSPORINE (RESTASIS) 0.05 % ophthalmic emulsion Place 1 drop into both eyes 2 (two) times daily.    . D-MANNOSE PO Take by mouth.    . dexlansoprazole (DEXILANT) 60 MG capsule Take 1 capsule (60 mg total) by mouth daily. Crack open the capsule and pour into spoon prior to ingestion 30 capsule 5  . Erenumab-aooe (AIMOVIG [place]) Inject into the skin every 30 (thirty) days.     . ferrous sulfate 325 (65 FE) MG tablet Take 325 mg by mouth 2 (two) times daily with a meal.    . immune globulin, human, (GAMMAGARD S/D LESS IGA) 10 g injection Inject into the vein every 21 ( twenty-one) days.     Marland Kitchen lamoTRIgine (LAMICTAL) 25 MG tablet Take 2 tablets (50 mg total) by mouth daily. 60 tablet 1  . levothyroxine (SYNTHROID) 75 MCG tablet Take 1 tablet (75 mcg total) by mouth daily before breakfast. 90 tablet 1  . magnesium oxide (MAG-OX) 400 MG tablet Take 400 mg by mouth daily.     . potassium chloride SA (K-DUR) 20 MEQ tablet TAKE 2 TABLETS BY MOUTH DAILY (MAY DISSOLVE IN WATER) 60 tablet 0  . rOPINIRole (REQUIP) 0.5 MG tablet Take 0.5 tablets (0.25 mg total) by mouth 2 (two) times daily. 60 tablet 3  . tiZANidine (ZANAFLEX) 2 MG tablet Take 2 mg by mouth 3 (three) times daily as needed for muscle spasms.     Marland Kitchen torsemide (DEMADEX) 20 MG tablet Take 20 mg by mouth daily.     Marland Kitchen asenapine (SAPHRIS) 5 MG SUBL 24 hr tablet Place 1 tablet (5 mg total) under the tongue at bedtime. 30 tablet 0  . Asenapine Maleate (SAPHRIS) 2.5 MG SUBL Place 1 tablet (2.5 mg total) under the tongue 2 (two) times daily. 10 tablet 0  . LASTACAFT 0.25 % SOLN Apply 1 drop to eye daily.    Marland Kitchen OLANZapine (ZYPREXA) 7.5 MG tablet Take 1 tablet (7.5 mg total) by mouth at bedtime. 30 tablet 1   No  current facility-administered medications for this visit.    Medication Side Effects: Other: Increased Appetite and Wt gain.  Allergies:  Allergies  Allergen Reactions  . Tetracycline Nausea Only    Causes ringing in ears, dizziness, migraine, nausea  . Corticosteroids Other (See Comments)    12/02/2016 interview by Melissa Montane: Oral dosing causes migraines/nausea/tinnitus. Prev tolerated IV and intranasal admin w/o difficulty.  . Cefixime Other (See Comments)    Headache   . Ciprofloxacin Other (See Comments)    Headache, dizziness, ringing in the ears  . Doxycycline Other (See Comments)    unknown  . Gabapentin     Throat swells/closes  . Isometheptene-Dichloral-Apap Other (See Comments)    unknown  . Ketorolac     12/02/2016 interview by AVW: Oral dosing prednisone/corticosteroids causes migraines/nausea/tinnitus. Prev tolerated IV and intranasal admin  w/o difficulty.  . Prednisone Other (See Comments)    Migraine, dizzy, ringing in ears-can take it IV 12/02/2016 interview by [name]: Oral prednisone dosing causes migraines/nausea/tinnitus. Prev tolerated IV and intranasal admin w/o difficulty.  . Promethazine Other (See Comments)    causes severe abdominal pain when taken IV; however she can take PO or IM  . Stadol [Butorphanol]     Cerebral pain  . Erythromycin Base Diarrhea and Itching    Severe diarrhea  . Propoxyphene Nausea Only    Past Medical History:  Diagnosis Date  . Arachnoiditis   . Bipolar disorder ([place])   . Chronic back pain   . Chronic pain   . Chronic post-thoracotomy pain   . CKD (chronic kidney disease)   . GERD (gastroesophageal reflux disease)   . Hypogammaglobulinemia ([place])   . Hypotension   . Hypothyroid   . 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 Lung cancer ([place]) 2011, 2014  . Memory loss   . Migraine   . Opioid abuse ([place])   . Osteoporosis   . Presence of neurostimulator   . Recurrent upper respiratory infection (URI)   . Restless legs    . Sleep apnea   . Tremor   . Urge incontinence   . Urticaria    long time ago, nothing recent    Family History  Problem Relation Age of Onset  . Hypertension Mother   . GER disease Mother   . Dementia Mother   . Osteoporosis Mother   . Arthritis Mother   . Bipolar disorder Mother   . Parkinson's disease Father   . Congestive Heart Failure Father   . Pneumonia Father   . Arthritis Father   . Dementia Father   . Depression Father   . Asthma Sister   . Depression Sister   . Bipolar disorder Brother   . Asthma Daughter     Social History   Socioeconomic History  . Marital status: Married    Spouse name: Not on file  . Number of children: Not on file  . Years of education: Not on file  . Highest education level: Not on file  Occupational History  . Not on file  Tobacco Use  . Smoking status: Never Smoker  . Smokeless tobacco: Never Used  Substance and Sexual Activity  . Alcohol use: Not Currently  . Drug use: Never  . Sexual activity: Not on file  Other Topics Concern  . Not on file  Social History Narrative   Diet: None      Caffeine: coffee, tea, sodas less than or 1 daily.      Married, if yes what year: Yes, 1972      Do you live in a house, apartment, assisted living, condo, trailer, ect: Two story house       Pets: None      Current/Past profession: Teach      Exercise: None         Living Will: No   DNR: No   POA/HPOA: No      Functional Status:   Do you have difficulty bathing or dressing yourself? yes   Do you have difficulty preparing food or eating? yes   Do you have difficulty managing your medications? yes   Do you have difficulty managing your finances?yes   Do you have difficulty affording your medications? no   Social Determinants of Health   Financial Resource Strain:   . Difficulty of Paying Living Expenses: Not on  file  Food Insecurity:   . Worried About Charity fundraiser in the Last Year: Not on file  . Ran Out of Food  in the Last Year: Not on file  Transportation Needs:   . Lack of Transportation (Medical): Not on file  . Lack of Transportation (Non-Medical): Not on file  Physical Activity:   . Days of Exercise per Week: Not on file  . Minutes of Exercise per Session: Not on file  Stress:   . Feeling of Stress : Not on file  Social Connections:   . Frequency of Communication with Friends and Family: Not on file  . Frequency of Social Gatherings with Friends and Family: Not on file  . Attends Religious Services: Not on file  . Active Member of Clubs or Organizations: Not on file  . Attends Archivist Meetings: Not on file  . Marital Status: Not on file  Intimate Partner Violence:   . Fear of Current or Ex-Partner: Not on file  . Emotionally Abused: Not on file  . Physically Abused: Not on file  . Sexually Abused: Not on file    Past Medical History, Surgical history, Social history, and Family history were reviewed and updated as appropriate.   Please see review of systems for further details on the patient's review from today.   Objective:   Physical Exam:  Wt 138 lb (62.6 kg)   BMI 24.45 kg/m   Physical Exam Neurological:     Mental Status: She is alert and oriented to person, place, and time.     Cranial Nerves: No dysarthria.  Psychiatric:        Attention and Perception: Attention and perception normal.        Mood and Affect: Mood is anxious.        Speech: Speech normal.        Behavior: Behavior is cooperative.        Thought Content: Thought content normal. Thought content is not paranoid or delusional. Thought content does not include homicidal or suicidal ideation. Thought content does not include homicidal or suicidal plan.        Cognition and Memory: Cognition and memory normal.        Judgment: Judgment normal.     Comments: Insight intact     Lab Review:     Component Value Date/Time   NA 135 03/29/2019 2206   K 4.2 03/29/2019 2206   CL 100 03/29/2019  2206   CO2 27 03/29/2019 2206   GLUCOSE 113 (H) 03/29/2019 2206   BUN 32 (H) 03/29/2019 2206   CREATININE 1.12 (H) 03/29/2019 2206   CREATININE 1.40 (H) 03/06/2019 1119   CALCIUM 9.1 03/29/2019 2206   PROT 6.2 (L) 03/16/2019 2222   ALBUMIN 3.4 (L) 03/16/2019 2222   AST 24 03/16/2019 2222   ALT 14 03/16/2019 2222   ALKPHOS 53 03/16/2019 2222   BILITOT 0.3 03/16/2019 2222   GFRNONAA 50 (L) 03/29/2019 2206   GFRNONAA 38 (L) 03/06/2019 1119   GFRAA 58 (L) 03/29/2019 2206   GFRAA 44 (L) 03/06/2019 1119       Component Value Date/Time   WBC 9.2 05/31/2019 1157   RBC 4.26 05/31/2019 1157   HGB 11.4 (L) 05/31/2019 1157   HCT 35.3 (L) 05/31/2019 1157   PLT 284.0 05/31/2019 1157   MCV 83.0 05/31/2019 1157   MCH 28.1 03/29/2019 2206   MCHC 32.4 05/31/2019 1157   RDW 14.0 05/31/2019 1157   LYMPHSABS 1.3  05/31/2019 1157   MONOABS 0.8 05/31/2019 1157   EOSABS 0.1 05/31/2019 1157   BASOSABS 0.1 05/31/2019 1157    No results found for: POCLITH, LITHIUM   No results found for: PHENYTOIN, PHENOBARB, VALPROATE, CBMZ   .res Assessment: Plan:   Patient seen for 30 minutes and time spent counseling patient and discussing her concern about weight gain and compulsive eating.  Discussed that the side effect is likely due to olanzapine.  Patient reports that potential benefits for no longer outweighing side effects and she prefers to be taken off of olanzapine.  Patient reports that she would ideally like to take the medication that would also help with insomnia and anxiety, with less risk of weight gain.  Discussed potential benefits, risks, and side effects of Saphris and discussed that Saphris recently became generic, and typically has less risk of weight gain and metabolic side effects compared to olanzapine.  Discussed cross titration from olanzapine off to Saphris and that samples are available.  Patient reports that she will request that her husband pick up samples for her. Decrease  olanzapine to 7.5 mg one half tab p.o. nightly for 1 week, then stop. Start asenapine 2.5 mg p.o. nightly for 1 week, then increase to 5 mg p.o. nightly for mood stabilization.  Discussed that asenapine may also be helpful for her insomnia and anxiety. Continue all other medications as prescribed. Patient to follow-up in 4 weeks or sooner if clinically indicated. Patient advised to contact office with any questions, adverse effects, or acute worsening in signs and symptoms.  Megan Brewer was seen today for medication problem.  Diagnoses and all orders for this visit:  Insomnia due to other mental disorder -     Asenapine Maleate (SAPHRIS) 2.5 MG SUBL; Place 1 tablet (2.5 mg total) under the tongue 2 (two) times daily. -     asenapine (SAPHRIS) 5 MG SUBL 24 hr tablet; Place 1 tablet (5 mg total) under the tongue at bedtime.  Bipolar disorder, current episode mixed, moderate (HCC) -     Asenapine Maleate (SAPHRIS) 2.5 MG SUBL; Place 1 tablet (2.5 mg total) under the tongue 2 (two) times daily. -     asenapine (SAPHRIS) 5 MG SUBL 24 hr tablet; Place 1 tablet (5 mg total) under the tongue at bedtime. -     lamoTRIgine (LAMICTAL) 25 MG tablet; Take 2 tablets (50 mg total) by mouth daily.  Anxiety disorder, unspecified type -     Asenapine Maleate (SAPHRIS) 2.5 MG SUBL; Place 1 tablet (2.5 mg total) under the tongue 2 (two) times daily. -     asenapine (SAPHRIS) 5 MG SUBL 24 hr tablet; Place 1 tablet (5 mg total) under the tongue at bedtime.    Please see After Visit Summary for patient specific instructions.  Future Appointments  Date Time Provider [place]  11/03/2019  2:45 PM [name], NP [facility] None  11/07/2019  1:00 PM Thayer Headings, PMHNP [facility] None  11/13/2019  1:50 PM Narda Amber K, DO [facility] None  02/15/2020  1:45 PM [facility] NURSE [facility] None  09/27/2020 11:15 AM [name], NP [facility] None    No orders of the defined types were placed in this  encounter.     -------------------------------

## 2019-10-09 NOTE — Patient Instructions (Signed)
Decrease olanzapine to 7.5 mg one half tab p.o. nightly for 1 week, then stop. Start asenapine (Saphris) 2.5 mg p.o. nightly for 1 week while taking 1/2 tab of Olanzapine, then increase Saphris to 5 mg p.o. nightly.

## 2019-10-10 ENCOUNTER — Encounter: Payer: Self-pay | Admitting: Family

## 2019-10-10 ENCOUNTER — Other Ambulatory Visit: Payer: Self-pay

## 2019-10-10 ENCOUNTER — Ambulatory Visit (INDEPENDENT_AMBULATORY_CARE_PROVIDER_SITE_OTHER): Payer: Medicare PPO | Admitting: Family

## 2019-10-10 VITALS — BP 110/60 | HR 100 | Temp 98.3°F | Ht 63.0 in | Wt 133.0 lb

## 2019-10-10 DIAGNOSIS — R29898 Other symptoms and signs involving the musculoskeletal system: Secondary | ICD-10-CM

## 2019-10-10 DIAGNOSIS — G4709 Other insomnia: Secondary | ICD-10-CM

## 2019-10-10 DIAGNOSIS — R2681 Unsteadiness on feet: Secondary | ICD-10-CM | POA: Diagnosis not present

## 2019-10-10 MED ORDER — TRAZODONE HCL 50 MG PO TABS: 25.0000 mg | ORAL_TABLET | Freq: Every evening | ORAL | 3 refills | Status: DC | PRN

## 2019-10-10 NOTE — Patient Instructions (Addendum)
-   Physical therapy ordered today Agency will call you. - Trazodone 25-50 mg tablet one by mouth daily at bedtime for sleep  - Notify provider or go to ED  if symptoms worsen or fail to improve.

## 2019-10-10 NOTE — Progress Notes (Signed)
Provider: Aneesa Romey FNP-C  [name], NP  Patient Care Team: [name], NP as PCP - General (Geriatric Medicine)  Extended Emergency Contact Information Primary Emergency Contact: [facility] Address: [place]itter [place] Phone: [phone] Mobile Phone: [phone] Relation: Spouse  Code Status:  DNR Goals of care: Advanced Directive information Advanced Directives 10/02/2019  Does Patient Have a Medical Advance Directive? No  Type of Advance Directive -  Copy of [place] in Chart? -  Would patient like information on creating a medical advance directive? -     Chief Complaint  Patient presents with  . Acute Visit    referral for phjysical Therapy for leg    HPI:  Pt is a 74 y.o. female seen today for an acute visit for referral to Physical therapy.she is status post spinal Cord stimulator implant done 09/27/2019 at [facility] surgical service in [place] by Dr.Kapural Leonardo.she states her legs are wobbly would like referral for Physical Therapy to strengthen legs.she denies any numbness or tingling on legs.Has had no fall episode.  She continues to complain of rest less leg keeping her awake at night.Has up coming appointment with Neurologist.currently on ropinirole 0.5 mg tablet twice daily though states takes three tablet for relief.I've discussed with her to avoid increasing dosage due to her declining renal functions. She would like something else for her insomnia states has not slept for the past three days.thinks she has taken Melatonin for without any relief.    Past Medical History:  Diagnosis Date  . Arachnoiditis   . Bipolar disorder ([place])   . Chronic back pain   . Chronic pain   . Chronic post-thoracotomy pain   . CKD (chronic kidney disease)   . GERD (gastroesophageal reflux disease)   . Hypogammaglobulinemia ([place])   . Hypotension   . Hypothyroid   .  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 Lung cancer ([place]) 2011, 2014  . Memory loss   . Migraine   . Opioid abuse ([place])   . Osteoporosis   . Presence of neurostimulator   . Recurrent upper respiratory infection (URI)   . Restless legs   . Sleep apnea   . Tremor   . Urge incontinence   . Urticaria    long time ago, nothing recent   Past Surgical History:  Procedure Laterality Date  . ABDOMINAL HYSTERECTOMY    . CARPAL TUNNEL RELEASE    . CATARACT EXTRACTION Bilateral 2019  . CHOLECYSTECTOMY    . COLONOSCOPY  2015   [facility]  . CT LUNG SCREENING  2018  . DG  BONE DENSITY ([place] HX)  2018  . DIAGNOSTIC MAMMOGRAM  2019  . GASTRIC BYPASS    . LUNG CANCER SURGERY  02/2010  . SPINAL CORD STIMULATOR IMPLANT      Allergies  Allergen Reactions  . Tetracycline Nausea Only    Causes ringing in ears, dizziness, migraine, nausea  . Corticosteroids Other (See Comments)    12/02/2016 interview by Melissa Montane: Oral dosing causes migraines/nausea/tinnitus. Prev tolerated IV and intranasal admin w/o difficulty.  . Cefixime Other (See Comments)    Headache   . Ciprofloxacin Other (See Comments)    Headache, dizziness, ringing in the ears  . Doxycycline Other (See Comments)    unknown  . Gabapentin     Throat swells/closes  . Isometheptene-Dichloral-Apap Other (See Comments)    unknown  .  Ketorolac     12/02/2016 interview by DXI: Oral dosing prednisone/corticosteroids causes migraines/nausea/tinnitus. Prev tolerated IV and intranasal admin w/o difficulty.  . Prednisone Other (See Comments)    Migraine, dizzy, ringing in ears-can take it IV 12/02/2016 interview by [name]: Oral prednisone dosing causes migraines/nausea/tinnitus. Prev tolerated IV and intranasal admin w/o difficulty.  . Promethazine Other (See Comments)    causes severe abdominal pain when taken IV; however she can take PO or IM  . Stadol [Butorphanol]     Cerebral pain  . Erythromycin Base Diarrhea and Itching    Severe  diarrhea  . Propoxyphene Nausea Only    Outpatient Encounter Medications as of 10/10/2019  Medication Sig  . acetaminophen (TYLENOL) 650 MG CR tablet Take 1,300 mg by mouth every 8 (eight) hours as needed for pain or fever.   Marland Kitchen asenapine (SAPHRIS) 5 MG SUBL 24 hr tablet Place 1 tablet (5 mg total) under the tongue at bedtime.  . Asenapine Maleate (SAPHRIS) 2.5 MG SUBL Place 1 tablet (2.5 mg total) under the tongue 2 (two) times daily.  Marland Kitchen azelastine (ASTELIN) 0.1 % nasal spray SMARTSIG:1 Spray(s) Both Nares 1 to 2 Times Daily PRN  . Calcium Citrate 200 MG TABS Take 1 tablet by mouth daily.   Marland Kitchen CRANBERRY PO Take by mouth.  . cycloSPORINE (RESTASIS) 0.05 % ophthalmic emulsion Place 1 drop into both eyes 2 (two) times daily.  . D-MANNOSE PO Take by mouth.  . dexlansoprazole (DEXILANT) 60 MG capsule Take 1 capsule (60 mg total) by mouth daily. Crack open the capsule and pour into spoon prior to ingestion  . Erenumab-aooe (AIMOVIG [place]) Inject into the skin every 30 (thirty) days.   . ferrous sulfate 325 (65 FE) MG tablet Take 325 mg by mouth 2 (two) times daily with a meal.  . immune globulin, human, (GAMMAGARD S/D LESS IGA) 10 g injection Inject into the vein every 21 ( twenty-one) days.   Marland Kitchen lamoTRIgine (LAMICTAL) 25 MG tablet Take 2 tablets (50 mg total) by mouth daily.  Marland Kitchen LASTACAFT 0.25 % SOLN Apply 1 drop to eye daily.  Marland Kitchen levothyroxine (SYNTHROID) 75 MCG tablet Take 1 tablet (75 mcg total) by mouth daily before breakfast.  . magnesium oxide (MAG-OX) 400 MG tablet Take 400 mg by mouth daily.   Marland Kitchen OLANZapine (ZYPREXA) 7.5 MG tablet Take 1/2 tab po QHS x 1 week, then stop  . potassium chloride SA (K-DUR) 20 MEQ tablet TAKE 2 TABLETS BY MOUTH DAILY (MAY DISSOLVE IN WATER)  . rOPINIRole (REQUIP) 0.5 MG tablet Take 0.5 tablets (0.25 mg total) by mouth 2 (two) times daily.  Marland Kitchen tiZANidine (ZANAFLEX) 2 MG tablet Take 2 mg by mouth 3 (three) times daily as needed for muscle spasms.   Marland Kitchen torsemide (DEMADEX)  20 MG tablet Take 20 mg by mouth daily.    No facility-administered encounter medications on file as of 10/10/2019.    Review of Systems  Constitutional: Negative for appetite change, chills, fatigue and fever.  Respiratory: Negative for cough, chest tightness, shortness of breath and wheezing.   Cardiovascular: Negative for chest pain, palpitations and leg swelling.  Gastrointestinal: Negative for abdominal distention, abdominal pain, constipation, diarrhea, nausea and vomiting.  Genitourinary: Negative for difficulty urinating, dysuria, flank pain, frequency and urgency.  Musculoskeletal: Positive for gait problem. Negative for myalgias.       Status post Thoracic spinal cord stimulant implant   Skin: Negative for color change, pallor and rash.       Thoracic  spine incision post SCS implant.  Neurological: Negative for dizziness, speech difficulty, weakness, light-headedness, numbness and headaches.  Hematological: Does not bruise/bleed easily.  Psychiatric/Behavioral: Positive for sleep disturbance. Negative for agitation and confusion. The patient is not nervous/anxious.     Immunization History  Administered Date(s) Administered  . Fluad Quad(high Dose 65+) 06/06/2019  . Influenza, Seasonal, Injecte, Preservative Fre 11/14/2012  . Influenza,inj,Quad PF,6+ Mos 05/24/2018  . Influenza-Unspecified 04/17/2016, 06/09/2017  . PFIZER SARS-COV-2 Vaccination 09/07/2019, 09/28/2019  . Pneumococcal Conjugate-13 07/29/2017  . Pneumococcal Polysaccharide-23 07/29/2017  . Tdap 05/30/2017  . Zoster Recombinat (Shingrix) 06/17/2017   Pertinent  Health Maintenance Due  Topic Date Due  . PNA vac Low Risk Adult (2 of 2 - PCV13) 07/29/2018  . MAMMOGRAM  02/12/2021  . COLONOSCOPY  02/02/2024  . INFLUENZA VACCINE  Completed  . DEXA SCAN  Completed   Fall Risk  09/25/2019 07/25/2019 07/05/2019 04/03/2019 03/14/2019  Falls in the past year? 0 0 0 1 0  Number falls in past yr: 0 0 - 0 0  Injury with  Fall? 0 0 0 0 0    Vitals:   10/10/19 1352  BP: 110/60  Pulse: 100  Temp: 98.3 F (36.8 C)  TempSrc: Tympanic  SpO2: 98%  Weight: 133 lb (60.3 kg)  Height: 5\' 3"  (1.6 m)   Body mass index is 23.56 kg/m. Physical Exam Vitals reviewed.  Constitutional:      General: She is not in acute distress.    Appearance: She is normal weight. She is not ill-appearing.  HENT:     Head: Normocephalic.  Eyes:     General: No scleral icterus.       Right eye: No discharge.        Left eye: No discharge.     Extraocular Movements: Extraocular movements intact.     Conjunctiva/sclera: Conjunctivae normal.     Pupils: Pupils are equal, round, and reactive to light.  Cardiovascular:     Rate and Rhythm: Normal rate and regular rhythm.     Pulses: Normal pulses.     Heart sounds: Normal heart sounds. No murmur. No friction rub. No gallop.   Pulmonary:     Effort: Pulmonary effort is normal. No respiratory distress.     Breath sounds: Normal breath sounds. No wheezing, rhonchi or rales.  Chest:     Chest wall: No tenderness.  Abdominal:     General: Bowel sounds are normal. There is no distension.     Palpations: Abdomen is soft. There is no mass.     Tenderness: There is no abdominal tenderness. There is no right CVA tenderness, left CVA tenderness, guarding or rebound.  Musculoskeletal:        General: No swelling or tenderness.     Comments: Unsteady gait.Bilateral lower extremities chronic edema.   Skin:    General: Skin is warm and dry.     Coloration: Skin is not pale.     Findings: No bruising, erythema or rash.     Comments: Thoracic incision healed no signs of infection noted  Neurological:     Mental Status: She is alert and oriented to person, place, and time.     Cranial Nerves: No cranial nerve deficit.     Sensory: No sensory deficit.     Motor: No weakness.     Coordination: Coordination normal.     Gait: Gait abnormal.  Psychiatric:        Mood and Affect: Mood  normal.  Behavior: Behavior normal.        Thought Content: Thought content normal.        Judgment: Judgment normal.    Labs reviewed: Recent Labs    02/27/19 1745 03/06/19 1119 03/06/19 1624 03/16/19 2222 03/29/19 2206  NA 142   < > 137 138 135  K 4.1   < > 4.3 4.5 4.2  CL 102   < > 101 103 100  CO2 29   < > 26 26 27   GLUCOSE 83   < > 93 102* 113*  BUN 30*   < > 34* 38* 32*  CREATININE 1.56*   < > 1.38* 1.13* 1.12*  CALCIUM 9.3   < > 8.8* 8.7* 9.1  MG 2.0  --   --   --   --    < > = values in this interval not displayed.   Recent Labs    02/27/19 1745 03/06/19 1624 03/16/19 2222  AST 23 22 24   ALT 17 15 14   ALKPHOS 60 51 53  BILITOT 0.3 0.3 0.3  PROT 6.5 6.7 6.2*  ALBUMIN 3.5 3.4* 3.4*   Recent Labs    03/06/19 1624 03/06/19 1624 03/16/19 2222 03/29/19 2206 05/31/19 1157  WBC 4.9   < > 4.3 2.9* 9.2  NEUTROABS 3.4  --   --  1.5* 7.0  HGB 10.6*   < > 9.9* 9.6* 11.4*  HCT 33.6*   < > 32.3* 30.2* 35.3*  MCV 88.9   < > 90.0 88.3 83.0  PLT 139*   < > 159 155 284.0   < > = values in this interval not displayed.   Lab Results  Component Value Date   TSH 4.58 (H) 01/27/2019   No results found for: HGBA1C Lab Results  Component Value Date   CHOL  01/27/2010    144        ATP III CLASSIFICATION:  <200     mg/dL   Desirable  200-239  mg/dL   Borderline High  >=240    mg/dL   High          HDL 58 01/27/2010   LDLCALC  01/27/2010    72        Total Cholesterol/HDL:CHD Risk Coronary Heart Disease Risk Table                     Men   Women  1/2 Average Risk   3.4   3.3  Average Risk       5.0   4.4  2 X Average Risk   9.6   7.1  3 X Average Risk  23.4   11.0        Use the calculated Patient Ratio above and the CHD Risk Table to determine the patient's CHD Risk.        ATP III CLASSIFICATION (LDL):  <100     mg/dL   Optimal  100-129  mg/dL   Near or Above                    Optimal  130-159  mg/dL   Borderline  160-189  mg/dL   High  >190      mg/dL   Very High   TRIG 68 01/27/2010   CHOLHDL 2.5 01/27/2010    Significant Diagnostic Results in last 30 days:  No results found.  Assessment/Plan 1. Unsteady gait Reports legs are wobbly.does not use a cane.will refer to PT  for evaluation. - fall and safety precaution  - Ambulatory referral to [facility]  2. Weakness of both lower extremities Reports legs are wobbly.sensation and circulation intact.  - Ambulatory referral to Home Health  3. Other insomnia Her RLS contributing to her insomnia.Melatonin ineffective.Will start on trazodone.side effects discussed.Fall and safety precaution.verbalized understanding.  - traZODone (DESYREL) 50 MG tablet; Take 0.5-1 tablets (25-50 mg total) by mouth at bedtime as needed for sleep.  Dispense: 30 tablet; Refill: 3  Family/ staff Communication: Reviewed plan of care with patient verbalized understanding.   Labs/tests ordered: None   Next Appointment: Has Upcoming appointment 11/03/2019 with PCP   Sandrea Hughs, NP

## 2019-10-15 DIAGNOSIS — G8929 Other chronic pain: Secondary | ICD-10-CM | POA: Diagnosis not present

## 2019-10-15 DIAGNOSIS — Z48811 Encounter for surgical aftercare following surgery on the nervous system: Secondary | ICD-10-CM | POA: Diagnosis not present

## 2019-10-15 DIAGNOSIS — Z9682 Presence of neurostimulator: Secondary | ICD-10-CM | POA: Diagnosis not present

## 2019-10-15 DIAGNOSIS — R413 Other amnesia: Secondary | ICD-10-CM

## 2019-10-15 DIAGNOSIS — M810 Age-related osteoporosis without current pathological fracture: Secondary | ICD-10-CM

## 2019-10-15 DIAGNOSIS — F319 Bipolar disorder, unspecified: Secondary | ICD-10-CM

## 2019-10-15 DIAGNOSIS — R2681 Unsteadiness on feet: Secondary | ICD-10-CM

## 2019-10-15 DIAGNOSIS — R531 Weakness: Secondary | ICD-10-CM | POA: Diagnosis not present

## 2019-10-19 ENCOUNTER — Telehealth: Payer: Self-pay | Admitting: *Deleted

## 2019-10-19 NOTE — Telephone Encounter (Signed)
Will with [facility] called requesting verbal orders for OT 1x4weeks. Did evaluation today. Verbal orders given.

## 2019-10-28 ENCOUNTER — Other Ambulatory Visit: Payer: Self-pay | Admitting: Nurse Practitioner

## 2019-10-28 DIAGNOSIS — E039 Hypothyroidism, unspecified: Secondary | ICD-10-CM

## 2019-11-03 ENCOUNTER — Ambulatory Visit: Payer: Medicare Other | Admitting: Nurse Practitioner

## 2019-11-07 ENCOUNTER — Ambulatory Visit (INDEPENDENT_AMBULATORY_CARE_PROVIDER_SITE_OTHER): Payer: Medicare PPO | Admitting: Psychiatry

## 2019-11-07 ENCOUNTER — Encounter: Payer: Self-pay | Admitting: Psychiatry

## 2019-11-07 DIAGNOSIS — F3162 Bipolar disorder, current episode mixed, moderate: Secondary | ICD-10-CM | POA: Diagnosis not present

## 2019-11-07 DIAGNOSIS — F99 Mental disorder, not otherwise specified: Secondary | ICD-10-CM

## 2019-11-07 DIAGNOSIS — F5105 Insomnia due to other mental disorder: Secondary | ICD-10-CM

## 2019-11-07 DIAGNOSIS — F419 Anxiety disorder, unspecified: Secondary | ICD-10-CM

## 2019-11-07 MED ORDER — LAMOTRIGINE 100 MG PO TABS: ORAL_TABLET | ORAL | 1 refills | Status: DC

## 2019-11-07 MED ORDER — ASENAPINE MALEATE 5 MG SL SUBL: 5.0000 mg | SUBLINGUAL_TABLET | Freq: Every day | SUBLINGUAL | 1 refills | Status: DC

## 2019-11-07 NOTE — Progress Notes (Signed)
Megan Brewer [id number] 1945-11-15 74 y.o.  Virtual Visit via Telephone Note  I connected with pt on 11/07/19 at  1:00 PM EDT by telephone and verified that I am speaking with the correct person using two identifiers.   I discussed the limitations, risks, security and privacy concerns of performing an evaluation and management service by telephone and the availability of in person appointments. I also discussed with the patient that there may be a patient responsible charge related to this service. The patient expressed understanding and agreed to proceed.   I discussed the assessment and treatment plan with the patient. The patient was provided an opportunity to ask questions and all were answered. The patient agreed with the plan and demonstrated an understanding of the instructions.   The patient was advised to call back or seek an in-person evaluation if the symptoms worsen or if the condition fails to improve as anticipated.  I provided 30 minutes of non-face-to-face time during this encounter.  The patient was located at home.  The provider was located at [place].   Thayer Headings, PMHNP   Subjective:   Patient ID:  Megan Brewer is a 74 y.o. (DOB August 21, 1945) female.  Chief Complaint:  Chief Complaint  Patient presents with  . Follow-up    h/o anxiety, mood disturbance, and insomnia    HPI Megan Brewer presents for follow-up of mood, anxiety, and insomnia. She reports that she is no longer having as many food cravings and that her appetite has been good. She reports that she has not noticed any worsening in mood, anxiety, or insomnia with switch from Olanzapine to Asenapine. Denies any significant anxiety or panic attacks recently. Describes mood as "about normal." Denies depressed mood. "Still not laughing." Has had some irritability which she attributes to pain. Denies impulsive or risky behavior.  She reports difficulty staying asleep due to  pain. She reports that some nights she is sleeping only 2-4 hours and other nights sleep is adequate. Energy and motivation have been normal. Concentration varies based on sleep amount and pain. Denies SI.   Has been going to PT and medical appointments. Reports limited interests.    Past Psychiatric Medication Trials: Sertraline- Was effective for about 25 years and no longer seems to be as effective.  Prozac Paxil  Cymbalta- may have caused psychosis. Depakote Gabapentin Lithium- Edema. Minimally effective Seroquel Risperidone Zyprexa- Effective for insomnia, anxiety, and mood. Excessive wt gain.  Melatonin Ativan- Reports taking until 2 months ago.  Valium- "great for my depression." Xanax Trazodone- Ineffective Requip- Took for years at 0.5 mg po qd. Trileptal- shaking and night sweats  Review of Systems:  Review of Systems  Musculoskeletal: Negative for gait problem.       Hip Pain and pain in ribs  Neurological: Negative for tremors.       Denies any involuntary movements.   Psychiatric/Behavioral:       Please refer to HPI    Medications: I have reviewed the patient's current medications.  Current Outpatient Medications  Medication Sig Dispense Refill  . asenapine (SAPHRIS) 5 MG SUBL 24 hr tablet Place 1 tablet (5 mg total) under the tongue at bedtime. 30 tablet 1  . azelastine (ASTELIN) 0.1 % nasal spray SMARTSIG:1 Spray(s) Both Nares 1 to 2 Times Daily PRN    . Buprenorphine HCl-Naloxone HCl 4-1 MG FILM Place 1 strip under the tongue 2 (two) times daily.    . Calcium Citrate 200 MG TABS Take 1  tablet by mouth daily.     Marland Kitchen CRANBERRY PO Take by mouth.    . cycloSPORINE (RESTASIS) 0.05 % ophthalmic emulsion Place 1 drop into both eyes 2 (two) times daily.    . D-MANNOSE PO Take by mouth.    . dexlansoprazole (DEXILANT) 60 MG capsule Take 1 capsule (60 mg total) by mouth daily. Crack open the capsule and pour into spoon prior to ingestion 30 capsule 5  .  Erenumab-aooe (AIMOVIG [place]) Inject into the skin every 30 (thirty) days.     . ferrous sulfate 325 (65 FE) MG tablet Take 325 mg by mouth 2 (two) times daily with a meal.    . immune globulin, human, (GAMMAGARD S/D LESS IGA) 10 g injection Inject into the vein every 21 ( twenty-one) days.     Marland Kitchen lamoTRIgine (LAMICTAL) 100 MG tablet Take 1 tablet (100 mg total) by mouth daily for 14 days, THEN 1.5 tablets (150 mg total) daily for 16 days. 45 tablet 1  . levothyroxine (SYNTHROID) 75 MCG tablet TAKE ONE (1) TABLET BY MOUTH EACH DAY BEFORE BREAKFAST 90 tablet 0  . magnesium oxide (MAG-OX) 400 MG tablet Take 400 mg by mouth daily.     . potassium chloride SA (K-DUR) 20 MEQ tablet TAKE 2 TABLETS BY MOUTH DAILY (MAY DISSOLVE IN WATER) 60 tablet 0  . rOPINIRole (REQUIP) 0.5 MG tablet Take 0.5 tablets (0.25 mg total) by mouth 2 (two) times daily. 60 tablet 3  . tiZANidine (ZANAFLEX) 2 MG tablet Take 2 mg by mouth 3 (three) times daily as needed for muscle spasms.     Marland Kitchen torsemide (DEMADEX) 20 MG tablet Take 20 mg by mouth daily.     Marland Kitchen acetaminophen (TYLENOL) 650 MG CR tablet Take 1,300 mg by mouth every 8 (eight) hours as needed for pain or fever.     Marland Kitchen LASTACAFT 0.25 % SOLN Apply 1 drop to eye daily.     No current facility-administered medications for this visit.    Medication Side Effects: None  Allergies:  Allergies  Allergen Reactions  . Tetracycline Nausea Only    Causes ringing in ears, dizziness, migraine, nausea  . Corticosteroids Other (See Comments)    12/02/2016 interview by Melissa Montane: Oral dosing causes migraines/nausea/tinnitus. Prev tolerated IV and intranasal admin w/o difficulty.  . Cefixime Other (See Comments)    Headache   . Ciprofloxacin Other (See Comments)    Headache, dizziness, ringing in the ears  . Doxycycline Other (See Comments)    unknown  . Gabapentin     Throat swells/closes  . Isometheptene-Dichloral-Apap Other (See Comments)    unknown  . Ketorolac     12/02/2016  interview by UXN: Oral dosing prednisone/corticosteroids causes migraines/nausea/tinnitus. Prev tolerated IV and intranasal admin w/o difficulty.  . Prednisone Other (See Comments)    Migraine, dizzy, ringing in ears-can take it IV 12/02/2016 interview by [name]: Oral prednisone dosing causes migraines/nausea/tinnitus. Prev tolerated IV and intranasal admin w/o difficulty.  . Promethazine Other (See Comments)    causes severe abdominal pain when taken IV; however she can take PO or IM  . Stadol [Butorphanol]     Cerebral pain  . Erythromycin Base Diarrhea and Itching    Severe diarrhea  . Propoxyphene Nausea Only    Past Medical History:  Diagnosis Date  . Arachnoiditis   . Bipolar disorder ([place])   . Chronic back pain   . Chronic pain   . Chronic post-thoracotomy pain   . CKD (chronic kidney  disease)   . GERD (gastroesophageal reflux disease)   . Hypogammaglobulinemia ([place])   . Hypotension   . Hypothyroid   . 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 Lung cancer ([place]) 2011, 2014  . Memory loss   . Migraine   . Opioid abuse ([place])   . Osteoporosis   . Presence of neurostimulator   . Recurrent upper respiratory infection (URI)   . Restless legs   . Sleep apnea   . Tremor   . Urge incontinence   . Urticaria    long time ago, nothing recent    Family History  Problem Relation Age of Onset  . Hypertension Mother   . GER disease Mother   . Dementia Mother   . Osteoporosis Mother   . Arthritis Mother   . Bipolar disorder Mother   . Parkinson's disease Father   . Congestive Heart Failure Father   . Pneumonia Father   . Arthritis Father   . Dementia Father   . Depression Father   . Asthma Sister   . Depression Sister   . Bipolar disorder Brother   . Asthma Daughter     Social History   Socioeconomic History  . Marital status: Married    Spouse name: Not on file  . Number of children: Not on file  . Years of education: Not on file  . Highest education level:  Not on file  Occupational History  . Not on file  Tobacco Use  . Smoking status: Never Smoker  . Smokeless tobacco: Never Used  Substance and Sexual Activity  . Alcohol use: Not Currently  . Drug use: Never  . Sexual activity: Not on file  Other Topics Concern  . Not on file  Social History Narrative   Diet: None      Caffeine: coffee, tea, sodas less than or 1 daily.      Married, if yes what year: Yes, 1972      Do you live in a house, apartment, assisted living, condo, trailer, ect: Two story house       Pets: None      Current/Past profession: Teach      Exercise: None         Living Will: No   DNR: No   POA/HPOA: No      Functional Status:   Do you have difficulty bathing or dressing yourself? yes   Do you have difficulty preparing food or eating? yes   Do you have difficulty managing your medications? yes   Do you have difficulty managing your finances?yes   Do you have difficulty affording your medications? no   Social Determinants of Health   Financial Resource Strain:   . Difficulty of Paying Living Expenses:   Food Insecurity:   . Worried About Charity fundraiser in the Last Year:   . Arboriculturist in the Last Year:   Transportation Needs:   . Film/video editor (Medical):   Marland Kitchen Lack of Transportation (Non-Medical):   Physical Activity:   . Days of Exercise per Week:   . Minutes of Exercise per Session:   Stress:   . Feeling of Stress :   Social Connections:   . Frequency of Communication with Friends and Family:   . Frequency of Social Gatherings with Friends and Family:   . Attends Religious Services:   . Active Member of Clubs or Organizations:   . Attends Archivist Meetings:   Marland Kitchen Marital Status:  Intimate Partner Violence:   . Fear of Current or Ex-Partner:   . Emotionally Abused:   Marland Kitchen Physically Abused:   . Sexually Abused:     Past Medical History, Surgical history, Social history, and Family history were reviewed and  updated as appropriate.   Please see review of systems for further details on the patient's review from today.   Objective:   Physical Exam:  BP 102/80   Wt 133 lb (60.3 kg)   BMI 23.56 kg/m   Physical Exam Neurological:     Mental Status: She is alert and oriented to person, place, and time.     Cranial Nerves: No dysarthria.  Psychiatric:        Attention and Perception: Attention and perception normal.        Mood and Affect: Mood is not anxious.        Speech: Speech normal.        Behavior: Behavior is cooperative.        Thought Content: Thought content normal. Thought content is not paranoid or delusional. Thought content does not include homicidal or suicidal ideation. Thought content does not include homicidal or suicidal plan.        Cognition and Memory: Cognition and memory normal.        Judgment: Judgment normal.     Comments: Insight intact Mood presents as less depressed compared to last visit with some continued depressed mood     Lab Review:     Component Value Date/Time   NA 135 03/29/2019 2206   K 4.2 03/29/2019 2206   CL 100 03/29/2019 2206   CO2 27 03/29/2019 2206   GLUCOSE 113 (H) 03/29/2019 2206   BUN 32 (H) 03/29/2019 2206   CREATININE 1.12 (H) 03/29/2019 2206   CREATININE 1.40 (H) 03/06/2019 1119   CALCIUM 9.1 03/29/2019 2206   PROT 6.2 (L) 03/16/2019 2222   ALBUMIN 3.4 (L) 03/16/2019 2222   AST 24 03/16/2019 2222   ALT 14 03/16/2019 2222   ALKPHOS 53 03/16/2019 2222   BILITOT 0.3 03/16/2019 2222   GFRNONAA 50 (L) 03/29/2019 2206   GFRNONAA 38 (L) 03/06/2019 1119   GFRAA 58 (L) 03/29/2019 2206   GFRAA 44 (L) 03/06/2019 1119       Component Value Date/Time   WBC 9.2 05/31/2019 1157   RBC 4.26 05/31/2019 1157   HGB 11.4 (L) 05/31/2019 1157   HCT 35.3 (L) 05/31/2019 1157   PLT 284.0 05/31/2019 1157   MCV 83.0 05/31/2019 1157   MCH 28.1 03/29/2019 2206   MCHC 32.4 05/31/2019 1157   RDW 14.0 05/31/2019 1157   LYMPHSABS 1.3  05/31/2019 1157   MONOABS 0.8 05/31/2019 1157   EOSABS 0.1 05/31/2019 1157   BASOSABS 0.1 05/31/2019 1157    No results found for: POCLITH, LITHIUM   No results found for: PHENYTOIN, PHENOBARB, VALPROATE, CBMZ   .res Assessment: Plan:   Discussed potential benefits, risks, and side effects of increasing the Lamictal since patient has reported a partial improvement in mood signs and symptoms with lower doses of Lamictal.  Patient agrees to increase in dose of Lamictal.  Reminded patient to continue to monitor for rash, particularly with dose increase, and to contact office if she develops a rash.  Will increase Lamictal to 100 mg daily for 2 weeks, then increase to 150 mg daily. Continue asenapine 5 mg SL nightly. Patient to follow-up in 4 weeks or sooner if clinically indicated. Patient advised to contact office with any  questions, adverse effects, or acute worsening in signs and symptoms.  Megan Brewer was seen today for follow-up.  Diagnoses and all orders for this visit:  Bipolar disorder, current episode mixed, moderate (HCC) -     asenapine (SAPHRIS) 5 MG SUBL 24 hr tablet; Place 1 tablet (5 mg total) under the tongue at bedtime. -     lamoTRIgine (LAMICTAL) 100 MG tablet; Take 1 tablet (100 mg total) by mouth daily for 14 days, THEN 1.5 tablets (150 mg total) daily for 16 days.  Insomnia due to other mental disorder -     asenapine (SAPHRIS) 5 MG SUBL 24 hr tablet; Place 1 tablet (5 mg total) under the tongue at bedtime.  Anxiety disorder, unspecified type -     asenapine (SAPHRIS) 5 MG SUBL 24 hr tablet; Place 1 tablet (5 mg total) under the tongue at bedtime.    Please see After Visit Summary for patient specific instructions.  Future Appointments  Date Time Provider [place]  11/13/2019  1:50 PM Narda Amber K, DO [facility] None  11/16/2019  1:30 PM [facility] PREVISIT RM 51 [facility]  11/30/2019 11:00 AM Armbruster, Carlota Raspberry, MD [facility]  02/15/2020  1:45  PM [facility] NURSE [facility] None  09/27/2020 11:15 AM [name], NP [facility] None    No orders of the defined types were placed in this encounter.     -------------------------------

## 2019-11-13 ENCOUNTER — Other Ambulatory Visit: Payer: Self-pay

## 2019-11-13 ENCOUNTER — Encounter: Payer: Self-pay | Admitting: Neurology

## 2019-11-13 ENCOUNTER — Ambulatory Visit: Payer: Medicare PPO | Admitting: Neurology

## 2019-11-13 DIAGNOSIS — G2581 Restless legs syndrome: Secondary | ICD-10-CM

## 2019-11-13 MED ORDER — ROPINIROLE HCL 1 MG PO TABS: 1.0000 mg | ORAL_TABLET | Freq: Every day | ORAL | 3 refills | Status: DC

## 2019-11-13 NOTE — Patient Instructions (Signed)
Start ropinirole 1mg  at bedtime, you can try reducing the dose once your iron levels start to normalize.  Continue taking iron supplements.    Return to clinic in 1 year

## 2019-11-13 NOTE — Progress Notes (Addendum)
[facility] Clinic Note - Initial Visit   Date: 11/13/19  Megan Brewer MRN: [id number] DOB: 05/30/1946   Dear Marlowe Sax, NP:  Thank you for your kind referral of Megan Brewer for consultation of restless leg syndrome. Although her history is well known to you, please allow [place] to reiterate it for the purpose of our medical record. The patient was accompanied to the clinic by self.    History of Present Illness: Megan Brewer is a 74 y.o. female with lung cancer, chronic back pain s/p spinal cord stimulator (followed by pain management), depression, GERD, CKD, immunoglobulin deficiency, and hypothyroidism  presenting for evaluation of restless leg syndrome.   She reports having restless leg syndrome for about 20 years and has been on ropinirole 1mg  at bedtime which controls her symptoms, however, when she transferred care locally, she was instructed to come off her ropinirole and reduced it to 0.5mg  at bedtime.  Her RLS particularly became worse in February. Because this dose did not control her discomfort, she self-titrated ropinirole to 1mg  at bedtime with tablets that she had from prior refill.  When she takes ropinirole 1mg  at bedtime, she sleeps well and RLS does not bother her.  Of note, she was found to have severe iron deficiency anemia (ferritin 4.9) and is on iron supplements.    Out-side paper records, electronic medical record, and images have been reviewed where available and summarized as:  Lab Results  Component Value Date   FERRITIN 4.9 (L) 05/31/2019   Lab Results  Component Value Date   TSH 4.58 (H) 01/27/2019   No results found for: ESRSEDRATE, POCTSEDRATE  Past Medical History:  Diagnosis Date  . Arachnoiditis   . Bipolar disorder ([place])   . Chronic back pain   . Chronic pain   . Chronic post-thoracotomy pain   . CKD (chronic kidney disease)   . GERD (gastroesophageal reflux disease)   . Hypogammaglobulinemia  ([place])   . Hypotension   . Hypothyroid   . 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 Lung cancer ([place]) 2011, 2014  . Memory loss   . Migraine   . Opioid abuse ([place])   . Osteoporosis   . Presence of neurostimulator   . Recurrent upper respiratory infection (URI)   . Restless legs   . Sleep apnea   . Tremor   . Urge incontinence   . Urticaria    long time ago, nothing recent    Past Surgical History:  Procedure Laterality Date  . ABDOMINAL HYSTERECTOMY    . CARPAL TUNNEL RELEASE    . CATARACT EXTRACTION Bilateral 2019  . CHOLECYSTECTOMY    . COLONOSCOPY  2015   [facility]  . CT LUNG SCREENING  2018  . DG  BONE DENSITY ([place] HX)  2018  . DIAGNOSTIC MAMMOGRAM  2019  . GASTRIC BYPASS    . LUNG CANCER SURGERY  02/2010  . SPINAL CORD STIMULATOR IMPLANT       Medications:  Outpatient Encounter Medications as of 11/13/2019  Medication Sig  . acetaminophen (TYLENOL) 650 MG CR tablet Take 1,300 mg by mouth every 8 (eight) hours as needed for pain or fever.   Marland Kitchen asenapine (SAPHRIS) 5 MG SUBL 24 hr tablet Place 1 tablet (5 mg total) under the tongue at bedtime.  Marland Kitchen azelastine (ASTELIN) 0.1 % nasal spray SMARTSIG:1 Spray(s) Both Nares 1 to 2 Times Daily PRN  . Buprenorphine HCl-Naloxone HCl 4-1 MG FILM Place 1 strip under the  tongue 2 (two) times daily.  . Calcium Citrate 200 MG TABS Take 1 tablet by mouth daily.   Marland Kitchen CARAFATE 1 GM/10ML suspension SMARTSIG:2 Teaspoon By Mouth 3 Times Daily  . CRANBERRY PO Take by mouth.  . cycloSPORINE (RESTASIS) 0.05 % ophthalmic emulsion Place 1 drop into both eyes 2 (two) times daily.  . D-MANNOSE PO Take by mouth.  . dexlansoprazole (DEXILANT) 60 MG capsule Take 1 capsule (60 mg total) by mouth daily. Crack open the capsule and pour into spoon prior to ingestion  . Erenumab-aooe (AIMOVIG [place]) Inject into the skin every 30 (thirty) days.   . ferrous sulfate 325 (65 FE) MG tablet Take 325 mg by mouth 2 (two) times daily with a meal.  . immune globulin,  human, (GAMMAGARD S/D LESS IGA) 10 g injection Inject into the vein every 21 ( twenty-one) days.   Marland Kitchen lamoTRIgine (LAMICTAL) 100 MG tablet Take 1 tablet (100 mg total) by mouth daily for 14 days, THEN 1.5 tablets (150 mg total) daily for 16 days.  Marland Kitchen LASTACAFT 0.25 % SOLN Apply 1 drop to eye daily.  Marland Kitchen levothyroxine (SYNTHROID) 75 MCG tablet TAKE ONE (1) TABLET BY MOUTH EACH DAY BEFORE BREAKFAST  . magnesium oxide (MAG-OX) 400 MG tablet Take 400 mg by mouth daily.   . potassium chloride SA (K-DUR) 20 MEQ tablet TAKE 2 TABLETS BY MOUTH DAILY (MAY DISSOLVE IN WATER)  . rOPINIRole (REQUIP) 0.5 MG tablet Take 0.5 tablets (0.25 mg total) by mouth 2 (two) times daily.  Marland Kitchen sulfamethoxazole-trimethoprim (BACTRIM DS) 800-160 MG tablet Take by mouth.  Marland Kitchen tiZANidine (ZANAFLEX) 2 MG tablet Take 2 mg by mouth 3 (three) times daily as needed for muscle spasms.   Marland Kitchen torsemide (DEMADEX) 20 MG tablet Take 20 mg by mouth daily.    No facility-administered encounter medications on file as of 11/13/2019.    Allergies:  Allergies  Allergen Reactions  . Tetracycline Nausea Only    Causes ringing in ears, dizziness, migraine, nausea  . Corticosteroids Other (See Comments)    12/02/2016 interview by Melissa Montane: Oral dosing causes migraines/nausea/tinnitus. Prev tolerated IV and intranasal admin w/o difficulty.  . Cefixime Other (See Comments)    Headache   . Ciprofloxacin Other (See Comments)    Headache, dizziness, ringing in the ears  . Doxycycline Other (See Comments)    unknown  . Gabapentin     Throat swells/closes  . Isometheptene-Dichloral-Apap Other (See Comments)    unknown  . Ketorolac     12/02/2016 interview by PYK: Oral dosing prednisone/corticosteroids causes migraines/nausea/tinnitus. Prev tolerated IV and intranasal admin w/o difficulty.  . Prednisone Other (See Comments)    Migraine, dizzy, ringing in ears-can take it IV 12/02/2016 interview by [name]: Oral prednisone dosing causes  migraines/nausea/tinnitus. Prev tolerated IV and intranasal admin w/o difficulty.  . Promethazine Other (See Comments)    causes severe abdominal pain when taken IV; however she can take PO or IM  . Stadol [Butorphanol]     Cerebral pain  . Erythromycin Base Diarrhea and Itching    Severe diarrhea  . Propoxyphene Nausea Only    Family History: Family History  Problem Relation Age of Onset  . Hypertension Mother   . GER disease Mother   . Dementia Mother   . Osteoporosis Mother   . Arthritis Mother   . Bipolar disorder Mother   . Parkinson's disease Father   . Congestive Heart Failure Father   . Pneumonia Father   . Arthritis Father   .  Dementia Father   . Depression Father   . Asthma Sister   . Depression Sister   . Bipolar disorder Brother   . Asthma Daughter     Social History: Social History   Tobacco Use  . Smoking status: Never Smoker  . Smokeless tobacco: Never Used  Substance Use Topics  . Alcohol use: Not Currently  . Drug use: Never   Social History   Social History Narrative   Diet: None      Caffeine: coffee, tea, sodas less than or 1 daily.      Married, if yes what year: Yes, 1972      Do you live in a house, apartment, assisted living, condo, trailer, ect: Two story house       Pets: None      Current/Past profession: Teach      Exercise: None         Living Will: No   DNR: No   POA/HPOA: No      Functional Status:   Do you have difficulty bathing or dressing yourself? yes   Do you have difficulty preparing food or eating? yes   Do you have difficulty managing your medications? yes   Do you have difficulty managing your finances?yes   Do you have difficulty affording your medications? no    Vital Signs:  Ht 5' 3.6" (1.615 m)   Wt 139 lb (63 kg)   BMI 24.16 kg/m   Neurological Exam: MENTAL STATUS including orientation to time, place, person, recent and remote memory, attention span and concentration, language, and fund of  knowledge is normal.  Speech is not dysarthric.  CRANIAL NERVES: II:  No visual field defects.   III-IV-VI: Pupils equal round and reactive to light.  Normal conjugate, extra-ocular eye movements in all directions of gaze.  No nystagmus.  No ptosis.      VII:  Normal facial symmetry and movements.   VIII:  Normal hearing and vestibular function.   XI:  Normal shoulder shrug and head rotation.    MOTOR: Moderate bilateral feet edema. No atrophy, fasciculations or abnormal movements.  No pronator drift.   Upper Extremity:  Right  Left  Deltoid  5/5   5/5   Biceps  5/5   5/5   Triceps  5/5   5/5   Infraspinatus 5/5  5/5  Medial pectoralis 5/5  5/5  Wrist extensors  5/5   5/5   Wrist flexors  5/5   5/5   Finger extensors  5/5   5/5   Finger flexors  5/5   5/5   Dorsal interossei  5/5   5/5   Abductor pollicis  5/5   5/5   Tone ([place] scale)  0  0   Lower Extremity:  Right  Left  Hip flexors  5/5   5/5   Hip extensors  5/5   5/5   Adductor 5/5  5/5  Abductor 5/5  5/5  Knee flexors  5/5   5/5   Knee extensors  5/5   5/5   Dorsiflexors  5/5   5/5   Plantarflexors  5/5   5/5   Toe extensors  5/5   5/5   Toe flexors  5/5   5/5   Tone ([place] scale)  0  0   MSRs:  Right        Left                  brachioradialis 2+  2+  biceps 2+  2+  triceps 2+  2+  patellar 2+  2+  ankle jerk 2+  2+  [place]man no  no  plantar response down  down   SENSORY:  Normal and symmetric perception of light touch, pinprick, vibration  COORDINATION/GAIT: Normal finger-to- nose-finger. Intact rapid alternating movements bilaterally.   Gait narrow based and stable. Tandem and stressed gait intact.    IMPRESSION: Restless leg syndrome, long-standing.  Exacerbated in the setting of iron deficiency.  Ferritin is very low at 4.9 and ideally, in patients with RLS, target levels should be > 50.  Start ropinirole 1mg  at bedtime.  Renal adjustment not indicated. If symptoms are better controlled once  her iron levels come up, she can try reducing the dose of ropinirole   Return to clinic in 1 year   Thank you for allowing me to participate in patient's care.  If I can answer any additional questions, I would be pleased to do so.    Sincerely,    Jayquon Theiler K. Posey Pronto, DO

## 2019-11-16 ENCOUNTER — Other Ambulatory Visit: Payer: Self-pay

## 2019-11-16 ENCOUNTER — Other Ambulatory Visit (INDEPENDENT_AMBULATORY_CARE_PROVIDER_SITE_OTHER): Payer: Medicare PPO

## 2019-11-16 ENCOUNTER — Ambulatory Visit (AMBULATORY_SURGERY_CENTER): Payer: Self-pay | Admitting: *Deleted

## 2019-11-16 VITALS — Temp 95.7°F | Ht 63.0 in | Wt 137.2 lb

## 2019-11-16 DIAGNOSIS — D509 Iron deficiency anemia, unspecified: Secondary | ICD-10-CM

## 2019-11-16 LAB — CBC WITH DIFFERENTIAL/PLATELET
Basophils Absolute: 0.1 10*3/uL (ref 0.0–0.1)
Basophils Relative: 1.2 % (ref 0.0–3.0)
Eosinophils Absolute: 0.1 10*3/uL (ref 0.0–0.7)
Eosinophils Relative: 1.4 % (ref 0.0–5.0)
HCT: 36.9 % (ref 36.0–46.0)
Hemoglobin: 12.2 g/dL (ref 12.0–15.0)
Lymphocytes Relative: 20.2 % (ref 12.0–46.0)
Lymphs Abs: 1.5 10*3/uL (ref 0.7–4.0)
MCHC: 33 g/dL (ref 30.0–36.0)
MCV: 91.9 fl (ref 78.0–100.0)
Monocytes Absolute: 0.6 10*3/uL (ref 0.1–1.0)
Monocytes Relative: 8.1 % (ref 3.0–12.0)
Neutro Abs: 5.1 10*3/uL (ref 1.4–7.7)
Neutrophils Relative %: 69.1 % (ref 43.0–77.0)
Platelets: 236 10*3/uL (ref 150.0–400.0)
RBC: 4.02 Mil/uL (ref 3.87–5.11)
RDW: 15.4 % (ref 11.5–15.5)
WBC: 7.5 10*3/uL (ref 4.0–10.5)

## 2019-11-16 MED ORDER — NA SULFATE-K SULFATE-MG SULF 17.5-3.13-1.6 GM/177ML PO SOLN: ORAL | 0 refills | Status: DC

## 2019-11-16 NOTE — Progress Notes (Signed)

## 2019-11-17 LAB — IRON,TIBC AND FERRITIN PANEL
%SAT: 53 % (calc) — ABNORMAL HIGH (ref 16–45)
Ferritin: 86 ng/mL (ref 16–288)
Iron: 180 ug/dL — ABNORMAL HIGH (ref 45–160)
TIBC: 340 mcg/dL (calc) (ref 250–450)

## 2019-11-30 ENCOUNTER — Encounter: Payer: Medicare PPO | Admitting: Gastroenterology

## 2019-12-06 ENCOUNTER — Ambulatory Visit: Payer: Medicare PPO | Admitting: Nurse Practitioner

## 2019-12-08 ENCOUNTER — Ambulatory Visit (INDEPENDENT_AMBULATORY_CARE_PROVIDER_SITE_OTHER): Payer: Medicare PPO | Admitting: Nurse Practitioner

## 2019-12-08 ENCOUNTER — Other Ambulatory Visit: Payer: Self-pay

## 2019-12-08 ENCOUNTER — Encounter: Payer: Self-pay | Admitting: Nurse Practitioner

## 2019-12-08 VITALS — BP 90/68 | HR 78 | Temp 97.5°F | Resp 16 | Ht 63.0 in | Wt 132.9 lb

## 2019-12-08 DIAGNOSIS — G4709 Other insomnia: Secondary | ICD-10-CM | POA: Diagnosis not present

## 2019-12-08 DIAGNOSIS — E039 Hypothyroidism, unspecified: Secondary | ICD-10-CM | POA: Diagnosis not present

## 2019-12-08 DIAGNOSIS — G8922 Chronic post-thoracotomy pain: Secondary | ICD-10-CM

## 2019-12-08 DIAGNOSIS — R600 Localized edema: Secondary | ICD-10-CM

## 2019-12-08 DIAGNOSIS — M810 Age-related osteoporosis without current pathological fracture: Secondary | ICD-10-CM

## 2019-12-08 DIAGNOSIS — G2581 Restless legs syndrome: Secondary | ICD-10-CM | POA: Diagnosis not present

## 2019-12-08 DIAGNOSIS — K5903 Drug induced constipation: Secondary | ICD-10-CM

## 2019-12-08 DIAGNOSIS — K209 Esophagitis, unspecified without bleeding: Secondary | ICD-10-CM

## 2019-12-08 DIAGNOSIS — N390 Urinary tract infection, site not specified: Secondary | ICD-10-CM

## 2019-12-08 NOTE — Patient Instructions (Signed)
Call insurance to see if they have covered the Prevnar 13 pneumonia shot in the past It looks like you have had the pneumococcal 23 but unsure about the prevnar 13.

## 2019-12-08 NOTE — Progress Notes (Signed)
Careteam: Patient Care Team: [name], NP as PCP - General (Geriatric Medicine) Alda Berthold, DO as Consulting Physician (Neurology)  PLACE OF SERVICE:  [place] Directive information Does Patient Have a Medical Advance Directive?: No  Allergies  Allergen Reactions  . Tetracycline Nausea Only    Causes ringing in ears, dizziness, migraine, nausea  . Corticosteroids Other (See Comments)    12/02/2016 interview by Melissa Montane: Oral dosing causes migraines/nausea/tinnitus. Prev tolerated IV and intranasal admin w/o difficulty.  . Cefixime Other (See Comments)    Headache   . Ciprofloxacin Other (See Comments)    Headache, dizziness, ringing in the ears  . Doxycycline Other (See Comments)    unknown  . Gabapentin     Throat swells/closes  . Isometheptene-Dichloral-Apap Other (See Comments)    unknown  . Ketorolac     12/02/2016 interview by CLE: Oral dosing prednisone/corticosteroids causes migraines/nausea/tinnitus. Prev tolerated IV and intranasal admin w/o difficulty.  . Prednisone Other (See Comments)    Migraine, dizzy, ringing in ears-can take it IV 12/02/2016 interview by [name]: Oral prednisone dosing causes migraines/nausea/tinnitus. Prev tolerated IV and intranasal admin w/o difficulty.  . Promethazine Other (See Comments)    causes severe abdominal pain when taken IV; however she can take PO or IM  . Stadol [Butorphanol]     Cerebral pain  . Erythromycin Base Diarrhea and Itching    Severe diarrhea  . Propoxyphene Nausea Only    Chief Complaint  Patient presents with  . Medical Management of Chronic Issues    4 Month Follow Up  . Health Maintenance    Discuss the need for Hepatitis Vaccine.  . Immunizations    Discuss the need for PNA Vaccine.      HPI: Patient is a 74 y.o. female for routine follow up.  She has completed PT. Reports she is still doing the exercises.   Insomnia- did not continue on trazodone, following with psych.  Reports she does not need it. It did not help her sleep.  Depression/bipolar- followed by psych, no on saphris and Lamictal   Osteoporosis- getting prolia at our office now.   Chronic pain- continues on suboxone and nerve stimulator through pain management.   RLS- improved on requip 1 mg at bedtime.   GERD- following with gi, continues on dexilant.   Iron def anemia- improved with iron daily  Recurrent UTI- currently on antibiotic and AZO due to current antibiotic.   Le edema- uses demadex and potassium supplement daily   Unsure if she has had the pneumar 13 Review of Systems:  Review of Systems  Constitutional: Negative for chills, fever and weight loss.  HENT: Negative for tinnitus.   Respiratory: Negative for cough, sputum production and shortness of breath.   Cardiovascular: Negative for chest pain, palpitations and leg swelling.  Gastrointestinal: Negative for abdominal pain, constipation, diarrhea and heartburn.  Genitourinary: Positive for dysuria (currently being treated for UTI). Negative for frequency and urgency.  Musculoskeletal: Positive for falls. Negative for back pain, joint pain and myalgias.       Chronic pain, managed through pain management and controlled at this time  Skin: Negative.   Neurological: Negative for dizziness and headaches.  Psychiatric/Behavioral: Positive for depression. Negative for memory loss. The patient does not have insomnia.     Past Medical History:  Diagnosis Date  . Allergy   . Anemia   . Arachnoiditis   . Bipolar disorder ([place])   . Blood transfusion  without reported diagnosis   . Cataract    removed  . Chronic back pain   . Chronic pain   . Chronic post-thoracotomy pain   . CKD (chronic kidney disease)   . GERD (gastroesophageal reflux disease)   . Hypogammaglobulinemia ([employer])   . Hypotension   . Hypothyroid   . 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 Lung cancer ([place]) 2011, 2014  . Memory loss   . Migraine   .  Opioid abuse ([place])   . Osteoporosis   . Presence of neurostimulator   . Recurrent upper respiratory infection (URI)   . Restless legs   . Sleep apnea   . Tremor   . Urge incontinence   . Urticaria    long time ago, nothing recent   Past Surgical History:  Procedure Laterality Date  . ABDOMINAL HYSTERECTOMY    . CARPAL TUNNEL RELEASE    . CATARACT EXTRACTION Bilateral 2019  . CHOLECYSTECTOMY    . COLONOSCOPY  2015   [facility]  . CT LUNG SCREENING  2018  . DG  BONE DENSITY ([place] HX)  2018  . DIAGNOSTIC MAMMOGRAM  2019  . GASTRIC BYPASS    . LUNG CANCER SURGERY  02/2010  . MULTIPLE TOOTH EXTRACTIONS    . SPINAL CORD STIMULATOR IMPLANT     Social History:   reports that she has never smoked. She has never used smokeless tobacco. She reports previous alcohol use. She reports that she does not use drugs.  Family History  Problem Relation Age of Onset  . Hypertension Mother   . GER disease Mother   . Dementia Mother   . Osteoporosis Mother   . Arthritis Mother   . Bipolar disorder Mother   . Parkinson's disease Father   . Congestive Heart Failure Father   . Pneumonia Father   . Arthritis Father   . Dementia Father   . Depression Father   . Asthma Sister   . Depression Sister   . Bipolar disorder Brother   . Asthma Daughter   . Colon cancer Neg Hx   . Esophageal cancer Neg Hx   . Stomach cancer Neg Hx   . Rectal cancer Neg Hx     Medications: Patient's Medications  New Prescriptions   No medications on file  Previous Medications   ACETAMINOPHEN (TYLENOL) 650 MG CR TABLET    Take 1,300 mg by mouth every 8 (eight) hours as needed for pain or fever.    ASENAPINE (SAPHRIS) 5 MG SUBL 24 HR TABLET    Place 1 tablet (5 mg total) under the tongue at bedtime.   AZELASTINE (ASTELIN) 0.1 % NASAL SPRAY    SMARTSIG:1 Spray(s) Both Nares 1 to 2 Times Daily PRN   BUPRENORPHINE HCL-NALOXONE HCL 4-1 MG FILM    Place 1 strip under the tongue 2 (two) times daily.   CALCIUM CITRATE 200  MG TABS    Take 1 tablet by mouth daily.    CARAFATE 1 GM/10ML SUSPENSION    SMARTSIG:2 Teaspoon By Mouth 3 Times Daily   CRANBERRY PO    Take by mouth.   CYCLOSPORINE (RESTASIS) 0.05 % OPHTHALMIC EMULSION    Place 1 drop into both eyes 2 (two) times daily.   D-MANNOSE PO    Take by mouth.   DEXLANSOPRAZOLE (DEXILANT) 60 MG CAPSULE    Take 1 capsule (60 mg total) by mouth daily. Crack open the capsule and pour into spoon prior to ingestion   FERROUS SULFATE 325 (  65 FE) MG TABLET    Take 325 mg by mouth 2 (two) times daily with a meal.   IMMUNE GLOBULIN, HUMAN, (GAMMAGARD S/D LESS IGA) 10 G INJECTION    Inject into the vein every 21 ( twenty-one) days.    LAMOTRIGINE (LAMICTAL) 100 MG TABLET    Take 1 tablet (100 mg total) by mouth daily for 14 days, THEN 1.5 tablets (150 mg total) daily for 16 days.   LASTACAFT 0.25 % SOLN    Apply 1 drop to eye daily.   LEVOTHYROXINE (SYNTHROID) 75 MCG TABLET    TAKE ONE (1) TABLET BY MOUTH EACH DAY BEFORE BREAKFAST   MAGNESIUM OXIDE (MAG-OX) 400 MG TABLET    Take 400 mg by mouth daily.    NA SULFATE-K SULFATE-MG SULF 17.5-3.13-1.6 GM/177ML SOLN    Dispense as written, no substitutions   POTASSIUM CHLORIDE SA (K-DUR) 20 MEQ TABLET    TAKE 2 TABLETS BY MOUTH DAILY (MAY DISSOLVE IN WATER)   ROPINIROLE (REQUIP) 1 MG TABLET    Take 1 tablet (1 mg total) by mouth at bedtime.   SULFAMETHOXAZOLE-TRIMETHOPRIM (BACTRIM DS) 800-160 MG TABLET    Take 1 tablet by mouth 2 (two) times daily.   TIZANIDINE (ZANAFLEX) 2 MG TABLET    Take 2 mg by mouth 3 (three) times daily as needed for muscle spasms.    TORSEMIDE (DEMADEX) 20 MG TABLET    Take 20 mg by mouth daily.   Modified Medications   No medications on file  Discontinued Medications   ERENUMAB-AOOE (AIMOVIG [place])    Inject into the skin every 30 (thirty) days.     Physical Exam:  Vitals:   12/08/19 1504  BP: 90/68  Pulse: 78  Resp: 16  Temp: (!) 97.5 F (36.4 C)  SpO2: 93%  Weight: 132 lb 14.4 oz (60.3 kg)   Height: 5\' 3"  (1.6 m)   Body mass index is 23.54 kg/m. Wt Readings from Last 3 Encounters:  12/08/19 132 lb 14.4 oz (60.3 kg)  11/16/19 137 lb 3.2 oz (62.2 kg)  11/13/19 139 lb (63 kg)    Physical Exam Constitutional:      General: She is not in acute distress.    Appearance: She is well-developed. She is not diaphoretic.  HENT:     Head: Normocephalic and atraumatic.     Mouth/Throat:     Pharynx: No oropharyngeal exudate.  Eyes:     Conjunctiva/sclera: Conjunctivae normal.     Pupils: Pupils are equal, round, and reactive to light.  Cardiovascular:     Rate and Rhythm: Normal rate and regular rhythm.     Heart sounds: Normal heart sounds.  Pulmonary:     Effort: Pulmonary effort is normal.     Breath sounds: Normal breath sounds.  Abdominal:     General: Bowel sounds are normal.     Palpations: Abdomen is soft.     Tenderness: There is abdominal tenderness (generalized).  Musculoskeletal:        General: No tenderness.     Cervical back: Normal range of motion and neck supple.     Right lower leg: Edema (trace) present.     Left lower leg: Edema (trace) present.  Skin:    General: Skin is warm and dry.  Neurological:     Mental Status: She is alert and oriented to person, place, and time.  Psychiatric:        Mood and Affect: Mood normal.  Behavior: Behavior normal.     Labs reviewed: Basic Metabolic Panel: Recent Labs    01/27/19 1048 01/27/19 1048 02/27/19 1745 03/06/19 1119 03/06/19 1624 03/16/19 2222 03/29/19 2206  NA 142   < > 142   < > 137 138 135  K 4.5   < > 4.1   < > 4.3 4.5 4.2  CL 104   < > 102   < > 101 103 100  CO2 29   < > 29   < > 26 26 27   GLUCOSE 70   < > 83   < > 93 102* 113*  BUN 25   < > 30*   < > 34* 38* 32*  CREATININE 1.00*   < > 1.56*   < > 1.38* 1.13* 1.12*  CALCIUM 9.3   < > 9.3   < > 8.8* 8.7* 9.1  MG  --   --  2.0  --   --   --   --   TSH 4.58*  --   --   --   --   --   --    < > = values in this interval not  displayed.   Liver Function Tests: Recent Labs    02/27/19 1745 03/06/19 1624 03/16/19 2222  AST 23 22 24   ALT 17 15 14   ALKPHOS 60 51 53  BILITOT 0.3 0.3 0.3  PROT 6.5 6.7 6.2*  ALBUMIN 3.5 3.4* 3.4*   No results for input(s): LIPASE, AMYLASE in the last 8760 hours. No results for input(s): AMMONIA in the last 8760 hours. CBC: Recent Labs    03/29/19 2206 05/31/19 1157 11/16/19 1331  WBC 2.9* 9.2 7.5  NEUTROABS 1.5* 7.0 5.1  HGB 9.6* 11.4* 12.2  HCT 30.2* 35.3* 36.9  MCV 88.3 83.0 91.9  PLT 155 284.0 236.0   Lipid Panel: No results for input(s): CHOL, HDL, LDLCALC, TRIG, CHOLHDL, LDLDIRECT in the last 8760 hours. TSH: Recent Labs    01/27/19 1048  TSH 4.58*   A1C: No results found for: HGBA1C   Assessment/Plan 1. Other insomnia -stable at this time. Continues to follow up with psych.  2. RLS (restless legs syndrome) -controlled on requip 1 mg   3. Osteoporosis, unspecified osteoporosis type, unspecified pathological fracture presence -continue on cal and vit d with prolia injection every 6 months.   4. Acquired hypothyroidism -continues on synthroid 75 mg  5. Recurrent UTI -being treated by urologist in [place]. Currently being treated for UTI with bactrim. She will call with her urologist information.  6. Esophagitis Controlled on carafate and dexilant   7. Drug-induced constipation Stable on metamucil   8. Lower leg edema -controlled with demadex and using potassium supplement.  - BASIC METABOLIC PANEL WITH GFR  9. Chronic post-thoracotomy pain -followed by pain management, continues on suboxone and nerve stimulator.   Next appt: 6 months, sooner if needed  Nuel Dejaynes K. [place], [facility] [phone]

## 2019-12-11 ENCOUNTER — Telehealth: Payer: Self-pay | Admitting: *Deleted

## 2019-12-11 NOTE — Telephone Encounter (Signed)
Noted thank you

## 2019-12-11 NOTE — Telephone Encounter (Signed)
Patient called and stated that she has completed her dose of Bactrim and still having symptoms of frequency and pressure.   I reviewed last OV note: 5. Recurrent UTI -being treated by urologist in [place]. Currently being treated for UTI with bactrim. She will call with her urologist information.   I instructed patient to call her Urologist to let them know and she agreed.

## 2019-12-12 LAB — TEST AUTHORIZATION

## 2019-12-12 LAB — BASIC METABOLIC PANEL WITH GFR
BUN/Creatinine Ratio: 23 (calc) — ABNORMAL HIGH (ref 6–22)
BUN: 28 mg/dL — ABNORMAL HIGH (ref 7–25)
CO2: 22 mmol/L (ref 20–32)
Calcium: 8.9 mg/dL (ref 8.6–10.4)
Chloride: 108 mmol/L (ref 98–110)
Creat: 1.21 mg/dL — ABNORMAL HIGH (ref 0.60–0.93)
GFR, Est African American: 52 mL/min/{1.73_m2} — ABNORMAL LOW (ref >=60)
GFR, Est Non African American: 45 mL/min/{1.73_m2} — ABNORMAL LOW (ref >=60)
Glucose, Bld: 91 mg/dL (ref 65–139)
Potassium: 5.1 mmol/L (ref 3.5–5.3)
Sodium: 139 mmol/L (ref 135–146)

## 2019-12-12 LAB — TSH: TSH: 2.04 mIU/L (ref 0.40–4.50)

## 2019-12-21 ENCOUNTER — Other Ambulatory Visit: Payer: Self-pay | Admitting: Specialist

## 2019-12-21 DIAGNOSIS — M542 Cervicalgia: Secondary | ICD-10-CM

## 2019-12-27 ENCOUNTER — Encounter: Payer: Self-pay | Admitting: Gastroenterology

## 2019-12-28 ENCOUNTER — Encounter: Payer: Self-pay | Admitting: Nurse Practitioner

## 2020-01-01 ENCOUNTER — Encounter: Payer: Self-pay | Admitting: Gastroenterology

## 2020-01-01 ENCOUNTER — Ambulatory Visit (AMBULATORY_SURGERY_CENTER): Payer: Medicare PPO | Admitting: Gastroenterology

## 2020-01-01 ENCOUNTER — Other Ambulatory Visit: Payer: Self-pay

## 2020-01-01 VITALS — BP 136/65 | HR 66 | Temp 96.6°F | Resp 15 | Ht 63.0 in | Wt 137.0 lb

## 2020-01-01 DIAGNOSIS — K2100 Gastro-esophageal reflux disease with esophagitis, without bleeding: Secondary | ICD-10-CM | POA: Diagnosis not present

## 2020-01-01 DIAGNOSIS — D123 Benign neoplasm of transverse colon: Secondary | ICD-10-CM

## 2020-01-01 DIAGNOSIS — D508 Other iron deficiency anemias: Secondary | ICD-10-CM

## 2020-01-01 DIAGNOSIS — K6289 Other specified diseases of anus and rectum: Secondary | ICD-10-CM | POA: Diagnosis not present

## 2020-01-01 DIAGNOSIS — K2950 Unspecified chronic gastritis without bleeding: Secondary | ICD-10-CM | POA: Diagnosis not present

## 2020-01-01 MED ORDER — SODIUM CHLORIDE 0.9 % IV SOLN: 500.0000 mL | Freq: Once | INTRAVENOUS | Status: DC

## 2020-01-01 NOTE — Op Note (Signed)
[facility] Patient Name: Megan Brewer Procedure Date: 01/01/2020 11:42 AM MRN: [id number] Endoscopist: Remo Lipps P. Havery Moros , MD Age: 74 Referring MD:  Date of Birth: 1945/10/25 Gender: Female Account #: [id number] Procedure:                Upper GI endoscopy Indications:              Iron deficiency anemia, Follow-up of reflux                            esophagitis - LA grade B on prior EGD - patient on                            Dexilant with good control of reflux at this time,                            history of gastric bypass surgery, patient endorses                            using Goody powders routinely for headaches (I was                            not aware of this until today) Medicines:                Monitored Anesthesia Care Procedure:                Pre-Anesthesia Assessment:                           - Prior to the procedure, a History and Physical                            was performed, and patient medications and                            allergies were reviewed. The patient's tolerance of                            previous anesthesia was also reviewed. The risks                            and benefits of the procedure and the sedation                            options and risks were discussed with the patient.                            All questions were answered, and informed consent                            was obtained. Prior Anticoagulants: The patient has                            taken no previous anticoagulant or antiplatelet  agents. ASA Grade Assessment: III - A patient with                            severe systemic disease. After reviewing the risks                            and benefits, the patient was deemed in                            satisfactory condition to undergo the procedure.                           After obtaining informed consent, the endoscope was                            passed under  direct vision. Throughout the                            procedure, the patient's blood pressure, pulse, and                            oxygen saturations were monitored continuously. The                            Endoscope was introduced through the mouth, and                            advanced to the jejunum. The upper GI endoscopy was                            accomplished without difficulty. The patient                            tolerated the procedure well. Scope In: Scope Out: Findings:                 Esophagogastric landmarks were identified: the                            Z-line was found at 39 cm, the gastroesophageal                            junction was found at 39 cm and the upper extent of                            the gastric folds was found at 39 cm from the                            incisors.                           Very mild esophagitis was found at the GEJ -  improved from prior exam. Located at angulated turn                            into gastric pouch, suspect due to stasis.                           The exam of the esophagus was otherwise normal.                           Evidence of a Roux-en-Y gastrojejunostomy was                            found. The gastrojejunal anastomosis was                            characterized by a visible staple which was removed                            with biopsy forceps. Anastomosis otherwise looked                            okay, .                           Erythematous mucosa with adherent heme was found in                            the gastric pouch, consistent with gastritis.                            Biopsies were taken with a cold forceps for                            Helicobacter pylori testing. Gastric pouch was                            small, retroflexed views not obtained,                           The examined jejunal limb was normal, as was the                             blind pouch. Complications:            No immediate complications. Estimated blood loss:                            Minimal. Estimated Blood Loss:     Estimated blood loss was minimal. Impression:               - Esophagogastric landmarks identified.                           - Mild esophagitis as outlined - improved from  previous, no evidence of Barrett's                           - Angulated turn at distal esophagus entering                            gastric pouch                           - Normal esophagus otherwise.                           - Roux-en-Y gastrojejunostomy with gastrojejunal                            anastomosis characterized by visible staple which                            was removed.                           - Gastritis of the gastric pouch. Biopsied.                           - Normal examined jejunal limb.                           Overall, suspect iron deficiency is due to                            gastritis in the setting of routine Goody powder                            use. Anemia improved recently. Recommendation:           - Patient has a contact number available for                            emergencies. The signs and symptoms of potential                            delayed complications were discussed with the                            patient. Return to normal activities tomorrow.                            Written discharge instructions were provided to the                            patient.                           - Resume previous diet.                           - Continue present medications including Dexilant                            (  crack capsule prior to ingestion)                           - Stop routine use of Goody powder and all NSAIDS                            given gastric bypass, history of gastritis, at high                            risk for ulcers - I think this is the cause of iron                             deficiency. Follow up with Neurology for chronic                            headache management                           - Await pathology results. Remo Lipps P. Farran Amsden, MD 01/01/2020 12:38:49 PM This report has been signed electronically.

## 2020-01-01 NOTE — Progress Notes (Signed)
Called to room to assist during endoscopic procedure.  Patient ID and intended procedure confirmed with present staff. Received instructions for my participation in the procedure from the performing physician.  

## 2020-01-01 NOTE — Progress Notes (Signed)
Pt's states no mCedical or surgical changes since previsit or office visit.   Pt stated that she forgot to stop her Iron tablets.She did not take them yesterday ( Sunday).  IV SB, CW vitals and LC temp.

## 2020-01-01 NOTE — Patient Instructions (Signed)
Discharge instructions given. Handouts on polyps and esophagitis. Avoid NSAIDS. Resume previous medications. YOU HAD AN ENDOSCOPIC PROCEDURE TODAY AT [facility]:   Refer to the procedure report that was given to you for any specific questions about what was found during the examination.  If the procedure report does not answer your questions, please call your gastroenterologist to clarify.  If you requested that your care partner not be given the details of your procedure findings, then the procedure report has been included in a sealed envelope for you to review at your convenience later.  YOU SHOULD EXPECT: Some feelings of bloating in the abdomen. Passage of more gas than usual.  Walking can help get rid of the air that was put into your GI tract during the procedure and reduce the bloating. If you had a lower endoscopy (such as a colonoscopy or flexible sigmoidoscopy) you may notice spotting of blood in your stool or on the toilet paper. If you underwent a bowel prep for your procedure, you may not have a normal bowel movement for a few days.  Please Note:  You might notice some irritation and congestion in your nose or some drainage.  This is from the oxygen used during your procedure.  There is no need for concern and it should clear up in a day or so.  SYMPTOMS TO REPORT IMMEDIATELY:   Following lower endoscopy (colonoscopy or flexible sigmoidoscopy):  Excessive amounts of blood in the stool  Significant tenderness or worsening of abdominal pains  Swelling of the abdomen that is new, acute  Fever of 100F or higher   Following upper endoscopy (EGD)  Vomiting of blood or coffee ground material  New chest pain or pain under the shoulder blades  Painful or persistently difficult swallowing  New shortness of breath  Fever of 100F or higher  Black, tarry-looking stools  For urgent or emergent issues, a gastroenterologist can be reached at any hour by calling [phone]  [phone]. Do not use MyChart messaging for urgent concerns.    DIET:  We do recommend a small meal at first, but then you may proceed to your regular diet.  Drink plenty of fluids but you should avoid alcoholic beverages for 24 hours.  ACTIVITY:  You should plan to take it easy for the rest of today and you should NOT DRIVE or use heavy machinery until tomorrow (because of the sedation medicines used during the test).    FOLLOW UP: Our staff will call the number listed on your records 48-72 hours following your procedure to check on you and address any questions or concerns that you may have regarding the information given to you following your procedure. If we do not reach you, we will leave a message.  We will attempt to reach you two times.  During this call, we will ask if you have developed any symptoms of COVID 19. If you develop any symptoms (ie: fever, flu-like symptoms, shortness of breath, cough etc.) before then, please call [phone].  If you test positive for Covid 19 in the 2 weeks post procedure, please call and report this information to [place].    If any biopsies were taken you will be contacted by phone or by letter within the next 1-3 weeks.  Please call us at [phone] if you have not heard about the biopsies in 3 weeks.    SIGNATURES/CONFIDENTIALITY: You and/or your care partner have signed paperwork which will be entered into your electronic medical record.  These signatures attest to the fact that that the information above on your After Visit Summary has been reviewed and is understood.  Full responsibility of the confidentiality of this discharge information lies with you and/or your care-partner.

## 2020-01-01 NOTE — Progress Notes (Signed)
Report to PACU, RN, vss, BBS= Clear.  

## 2020-01-01 NOTE — Op Note (Signed)
[facility] Patient Name: Megan Brewer Procedure Date: 01/01/2020 11:41 AM MRN: [id number] Endoscopist: Remo Lipps P. Havery Moros , MD Age: 74 Referring MD:  Date of Birth: May 12, 1946 Gender: Female Account #: [id number] Procedure:                Colonoscopy Indications:              history of iron deficiency anemia Medicines:                Monitored Anesthesia Care Procedure:                Pre-Anesthesia Assessment:                           - Prior to the procedure, a History and Physical                            was performed, and patient medications and                            allergies were reviewed. The patient's tolerance of                            previous anesthesia was also reviewed. The risks                            and benefits of the procedure and the sedation                            options and risks were discussed with the patient.                            All questions were answered, and informed consent                            was obtained. Prior Anticoagulants: The patient has                            taken no previous anticoagulant or antiplatelet                            agents. ASA Grade Assessment: III - A patient with                            severe systemic disease. After reviewing the risks                            and benefits, the patient was deemed in                            satisfactory condition to undergo the procedure.                           After obtaining informed consent, the colonoscope  was passed under direct vision. Throughout the                            procedure, the patient's blood pressure, pulse, and                            oxygen saturations were monitored continuously. The                            Colonoscope was introduced through the anus and                            advanced to the the terminal ileum, with                            identification of the  appendiceal orifice and IC                            valve. The colonoscopy was performed without                            difficulty. The patient tolerated the procedure                            well. The quality of the bowel preparation was                            good. The terminal ileum, ileocecal valve,                            appendiceal orifice, and rectum were photographed. Scope In: 12:01:16 PM Scope Out: 12:22:47 PM Scope Withdrawal Time: 0 hours 17 minutes 39 seconds  Total Procedure Duration: 0 hours 21 minutes 31 seconds  Findings:                 The perianal and digital rectal examinations were                            normal.                           The terminal ileum appeared normal.                           A 4 mm polyp was found in the transverse colon. The                            polyp was sessile. The polyp was removed with a                            cold snare. Resection and retrieval were complete.                           Anal papilla(e) were hypertrophied. Biopsies were  taken with a cold forceps for histology to ensure                            no evidence of AIN.                           The exam was otherwise without abnormality. Complications:            No immediate complications. Estimated blood loss:                            Minimal. Estimated Blood Loss:     Estimated blood loss was minimal. Impression:               - The examined portion of the ileum was normal.                           - One 4 mm polyp in the transverse colon, removed                            with a cold snare. Resected and retrieved.                           - Anal papilla(e) were hypertrophied. Biopsied to                            rule out AIN                           - The examination was otherwise normal.                           No cause for iron deficiency on colonoscopy, which                            is more than  likely related to EGD findings. Recommendation:           - Patient has a contact number available for                            emergencies. The signs and symptoms of potential                            delayed complications were discussed with the                            patient. Return to normal activities tomorrow.                            Written discharge instructions were provided to the                            patient.                           - Resume previous diet.                           -  Continue present medications.                           - Await pathology results. Remo Lipps P. Clarissia Mckeen, MD 01/01/2020 12:29:05 PM This report has been signed electronically.

## 2020-01-03 ENCOUNTER — Telehealth: Payer: Self-pay | Admitting: *Deleted

## 2020-01-03 NOTE — Telephone Encounter (Signed)
  Follow up Call-  Call back number 01/01/2020 01/25/2019  Post procedure Call Back phone  # [phone]  Permission to leave phone message Yes Yes  Some recent data might be hidden     Patient questions:  Do you have a fever, pain , or abdominal swelling? No. Pain Score  0 *  Have you tolerated food without any problems? Yes.    Have you been able to return to your normal activities? Yes.    Do you have any questions about your discharge instructions: Diet   No. Medications  No. Follow up visit  No.  Do you have questions or concerns about your Care? No.  Actions: * If pain score is 4 or above: No action needed, pain <4.   1. Have you developed a fever since your procedure? no  2.   Have you had an respiratory symptoms (SOB or cough) since your procedure? no  3.   Have you tested positive for COVID 19 since your procedure no  4.   Have you had any family members/close contacts diagnosed with the COVID 19 since your procedure?  no   If yes to any of these questions please route to Joylene John, RN and Erenest Rasher, RN

## 2020-01-12 ENCOUNTER — Telehealth: Payer: Self-pay | Admitting: Psychiatry

## 2020-01-12 ENCOUNTER — Other Ambulatory Visit: Payer: Self-pay | Admitting: Psychiatry

## 2020-01-12 DIAGNOSIS — F3162 Bipolar disorder, current episode mixed, moderate: Secondary | ICD-10-CM

## 2020-01-12 NOTE — Telephone Encounter (Signed)
Rx sent for Lamictal 150 mg daily

## 2020-01-12 NOTE — Telephone Encounter (Signed)
Pt called and stated that pharmacy has still not received refill request for Lamictal. Please re-send Rx to [facility], [place]

## 2020-01-17 ENCOUNTER — Other Ambulatory Visit: Payer: Self-pay | Admitting: Psychiatry

## 2020-01-17 DIAGNOSIS — F99 Mental disorder, not otherwise specified: Secondary | ICD-10-CM

## 2020-01-17 DIAGNOSIS — F5105 Insomnia due to other mental disorder: Secondary | ICD-10-CM

## 2020-01-17 DIAGNOSIS — F3162 Bipolar disorder, current episode mixed, moderate: Secondary | ICD-10-CM

## 2020-01-17 DIAGNOSIS — F419 Anxiety disorder, unspecified: Secondary | ICD-10-CM

## 2020-01-18 ENCOUNTER — Telehealth: Payer: Self-pay

## 2020-01-18 NOTE — Telephone Encounter (Signed)
Called Ms. Hirota and made appointment for 01/19/2020 with [facility]

## 2020-01-18 NOTE — Telephone Encounter (Signed)
Please schedule for a visit to evaluate shortness of breath then will refer for sleep study if indicated.

## 2020-01-18 NOTE — Telephone Encounter (Signed)
Megan Brewer called stating that that this week has been an wholly terror of a week. She  Has been gasping for air in her sleep and at times feel like she could not get her breath. This has been going on for about 18 months. She thought it was sinus problems, but due to the experiences of this week she feels like it's definitely sleep apnea. She was diagnosed about  40 years ago with sleep apnea, but because she had lost a tremendous amount of weight she thought it was gone. She would like a referral for a sleep study ASAP in hopes that it will put her on the path of getting some sleep. Please advise?

## 2020-01-19 ENCOUNTER — Encounter: Payer: Self-pay | Admitting: Family

## 2020-01-19 ENCOUNTER — Other Ambulatory Visit: Payer: Self-pay

## 2020-01-19 ENCOUNTER — Ambulatory Visit (INDEPENDENT_AMBULATORY_CARE_PROVIDER_SITE_OTHER): Payer: Medicare PPO | Admitting: Family

## 2020-01-19 VITALS — BP 126/78 | HR 78 | Temp 96.8°F | Ht 63.0 in | Wt 140.6 lb

## 2020-01-19 DIAGNOSIS — G4733 Obstructive sleep apnea (adult) (pediatric): Secondary | ICD-10-CM

## 2020-01-19 NOTE — Patient Instructions (Addendum)
-   Sleep Study referral order today at [facility].Study center will call for appointment.

## 2020-01-19 NOTE — Progress Notes (Signed)
Provider: Rankin Coolman FNP-C  [name], NP  Patient Care Team: [name], NP as PCP - General (Geriatric Medicine) Alda Berthold, DO as Consulting Physician (Neurology)  Extended Emergency Contact Information Primary Emergency Contact: [facility] Address: [address] [place] Phone: [phone] Mobile Phone: [phone] Relation: Spouse  Code Status:  DNR Goals of care: Advanced Directive information Advanced Directives 01/19/2020  Does Patient Have a Medical Advance Directive? No  Type of Advance Directive -  Copy of [place] in Chart? -  Would patient like information on creating a medical advance directive? No - Patient declined     Chief Complaint  Patient presents with  . Sleep Apnea    Evaluation for sleep apnea and a possible referral for sleep study     HPI:  Pt is a 74 y.o. female seen today for an acute visit for evaluation of possible sleep apnea.thought it was sinus but has not had problems over one year.Sleeps on recliner but doesn't recline.Has tried several position but not working.was told 40 yrs ago that she had sleep apnea but was overweight.she was test then and was told no sleep apnea.tries to sleep on her side and stomach but wakes up struggling to breath. Husband states snores at night.Apnea episodes happing every night for the past one week.  No issues with panic attack. No fever,chills,cough,shortness of breath or wheezing.  Past Medical History:  Diagnosis Date  . Allergy   . Anemia   . Arachnoiditis   . Bipolar disorder ([place])   . Blood transfusion without reported diagnosis   . Cataract    removed  . Chronic back pain   . Chronic pain   . Chronic post-thoracotomy pain   . CKD (chronic kidney disease)   . GERD (gastroesophageal reflux disease)   . Hypogammaglobulinemia ([place])   . Hypotension   . Hypothyroid   . Immunoglobulin subclass deficiency  (HCC)   . Lung cancer ([place]) 2011, 2014  . Memory loss   . Migraine   . Opioid abuse ([place])   . Osteoporosis   . Presence of neurostimulator   . Recurrent upper respiratory infection (URI)   . Restless legs   . Sleep apnea   . Tremor   . Urge incontinence   . Urticaria    long time ago, nothing recent   Past Surgical History:  Procedure Laterality Date  . ABDOMINAL HYSTERECTOMY    . CARPAL TUNNEL RELEASE    . CATARACT EXTRACTION Bilateral 2019  . CHOLECYSTECTOMY    . COLONOSCOPY  2015   [facility]  . CT LUNG SCREENING  2018  . DG  BONE DENSITY ([place] HX)  2018  . DIAGNOSTIC MAMMOGRAM  2019  . GASTRIC BYPASS    . LUNG CANCER SURGERY  02/2010  . MULTIPLE TOOTH EXTRACTIONS    . SPINAL CORD STIMULATOR IMPLANT      Allergies  Allergen Reactions  . Tetracycline Nausea Only    Causes ringing in ears, dizziness, migraine, nausea  . Corticosteroids Other (See Comments)    12/02/2016 interview by Melissa Montane: Oral dosing causes migraines/nausea/tinnitus. Prev tolerated IV and intranasal admin w/o difficulty.  . Cefixime Other (See Comments)    Headache   . Ciprofloxacin Other (See Comments)    Headache, dizziness, ringing in the ears  . Doxycycline Other (See Comments)    unknown  . Gabapentin  Throat swells/closes  . Isometheptene-Dichloral-Apap Other (See Comments)    unknown  . Ketorolac     12/02/2016 interview by JJH: Oral dosing prednisone/corticosteroids causes migraines/nausea/tinnitus. Prev tolerated IV and intranasal admin w/o difficulty.  . Prednisone Other (See Comments)    Migraine, dizzy, ringing in ears-can take it IV 12/02/2016 interview by [name]: Oral prednisone dosing causes migraines/nausea/tinnitus. Prev tolerated IV and intranasal admin w/o difficulty.  . Promethazine Other (See Comments)    causes severe abdominal pain when taken IV; however she can take PO or IM  . Stadol [Butorphanol]     Cerebral pain  . Erythromycin Base Diarrhea and Itching    Severe  diarrhea  . Propoxyphene Nausea Only    Outpatient Encounter Medications as of 01/19/2020  Medication Sig  . acetaminophen (TYLENOL) 650 MG CR tablet Take 1,300 mg by mouth every 8 (eight) hours as needed for pain or fever.   Marland Kitchen azelastine (ASTELIN) 0.1 % nasal spray SMARTSIG:1 Spray(s) Both Nares 1 to 2 Times Daily PRN  . Buprenorphine HCl-Naloxone HCl 4-1 MG FILM Place 1 strip under the tongue 2 (two) times daily.  . Calcium Citrate 200 MG TABS Take 1 tablet by mouth daily.   Marland Kitchen CARAFATE 1 GM/10ML suspension As needed  . CRANBERRY PO Take 2 tablets by mouth in the morning and at bedtime.   . cycloSPORINE (RESTASIS) 0.05 % ophthalmic emulsion Place 1 drop into both eyes 2 (two) times daily.  . D-MANNOSE PO Take by mouth.  . dexlansoprazole (DEXILANT) 60 MG capsule Take 1 capsule (60 mg total) by mouth daily. Crack open the capsule and pour into spoon prior to ingestion  . Erenumab-aooe (AIMOVIG, 140 MG DOSE,) 70 MG/ML SOAJ Inject into the skin every 30 (thirty) days.  . ferrous sulfate 325 (65 FE) MG tablet Take 325 mg by mouth 2 (two) times daily with a meal.  . immune globulin, human, (GAMMAGARD S/D LESS IGA) 10 g injection Inject into the vein every 21 ( twenty-one) days.   Marland Kitchen lamoTRIgine (LAMICTAL) 150 MG tablet Take 1 tablet (150 mg total) by mouth daily.  Marland Kitchen LASTACAFT 0.25 % SOLN Apply 1 drop to eye daily.  Marland Kitchen levothyroxine (SYNTHROID) 75 MCG tablet TAKE ONE (1) TABLET BY MOUTH EACH DAY BEFORE BREAKFAST  . magnesium oxide (MAG-OX) 400 MG tablet Take 400 mg by mouth daily.   . Na Sulfate-K Sulfate-Mg Sulf 17.5-3.13-1.6 GM/177ML SOLN Dispense as written, no substitutions  . potassium chloride SA (K-DUR) 20 MEQ tablet TAKE 2 TABLETS BY MOUTH DAILY (MAY DISSOLVE IN WATER)  . rOPINIRole (REQUIP) 1 MG tablet Take 1 tablet (1 mg total) by mouth at bedtime.  Marland Kitchen SAPHRIS 5 MG SUBL 24 hr tablet PLACE 1 TABLET UNDER THE TONGUE AT BEDTIME  . tiZANidine (ZANAFLEX) 2 MG tablet Take 2 mg by mouth 3 (three)  times daily as needed for muscle spasms.   Marland Kitchen torsemide (DEMADEX) 20 MG tablet Take 20 mg by mouth daily.   . [DISCONTINUED] sulfamethoxazole-trimethoprim (BACTRIM DS) 800-160 MG tablet Take 1 tablet by mouth 2 (two) times daily.   No facility-administered encounter medications on file as of 01/19/2020.    Review of Systems  Constitutional: Negative for appetite change, chills, fatigue and fever.  HENT: Positive for rhinorrhea. Negative for congestion, sinus pressure, sinus pain, sneezing, sore throat and trouble swallowing.   Eyes: Negative for discharge, redness and visual disturbance.  Respiratory: Negative for cough, chest tightness, shortness of breath and wheezing.  Apnea   Cardiovascular: Positive for leg swelling. Negative for chest pain and palpitations.       Chronic edema since 74 yrs old   Gastrointestinal: Negative for abdominal distention, abdominal pain, constipation, diarrhea, nausea and vomiting.  Skin: Negative for color change, pallor and rash.  Neurological: Negative for dizziness, seizures, speech difficulty, weakness, light-headedness and numbness.       Hx of migraines since mid 20's   Psychiatric/Behavioral: Negative for agitation, confusion and sleep disturbance. The patient is not nervous/anxious.     Immunization History  Administered Date(s) Administered  . Fluad Quad(high Dose 65+) 06/06/2019  . Influenza, Seasonal, Injecte, Preservative Fre 11/14/2012  . Influenza,inj,Quad PF,6+ Mos 05/24/2018  . Influenza-Unspecified 04/17/2016, 06/09/2017  . PFIZER SARS-COV-2 Vaccination 09/07/2019, 09/28/2019  . Pneumococcal Conjugate-13 07/29/2017  . Pneumococcal Polysaccharide-23 07/29/2017  . Tdap 05/30/2017  . Zoster Recombinat (Shingrix) 06/17/2017   Pertinent  Health Maintenance Due  Topic Date Due  . PNA vac Low Risk Adult (2 of 2 - PCV13) 07/29/2018  . INFLUENZA VACCINE  03/17/2020  . MAMMOGRAM  02/12/2021  . COLONOSCOPY  01/01/2027  . DEXA SCAN   Completed   Fall Risk  01/19/2020 12/08/2019 11/13/2019 09/25/2019 07/25/2019  Falls in the past year? 0 1 1 0 0  Number falls in past yr: 0 0 0 0 0  Injury with Fall? 0 0 0 0 0     Vitals:   01/19/20 1009  BP: 126/78  Pulse: 78  Temp: (!) 96.8 F (36 C)  TempSrc: Temporal  SpO2: 97%  Weight: 140 lb 9.6 oz (63.8 kg)  Height: 5\' 3"  (1.6 m)   Body mass index is 24.91 kg/m. Physical Exam Vitals reviewed.  Constitutional:      General: She is not in acute distress.    Appearance: She is normal weight. She is not ill-appearing.  HENT:     Head: Normocephalic.     Right Ear: Tympanic membrane, ear canal and external ear normal. There is no impacted cerumen.     Left Ear: Tympanic membrane, ear canal and external ear normal. There is no impacted cerumen.     Nose: Nose normal. No congestion or rhinorrhea.     Mouth/Throat:     Mouth: Mucous membranes are moist.     Pharynx: Oropharynx is clear. No oropharyngeal exudate or posterior oropharyngeal erythema.  Eyes:     General: No scleral icterus.       Right eye: No discharge.        Left eye: No discharge.     Extraocular Movements: Extraocular movements intact.     Conjunctiva/sclera: Conjunctivae normal.     Pupils: Pupils are equal, round, and reactive to light.  Neck:     Vascular: No carotid bruit.  Cardiovascular:     Rate and Rhythm: Normal rate and regular rhythm.     Pulses: Normal pulses.     Heart sounds: Normal heart sounds. No murmur. No friction rub. No gallop.   Pulmonary:     Effort: Pulmonary effort is normal. No respiratory distress.     Breath sounds: Normal breath sounds. No wheezing, rhonchi or rales.  Chest:     Chest wall: No tenderness.  Abdominal:     General: Bowel sounds are normal. There is no distension.     Palpations: Abdomen is soft. There is no mass.     Tenderness: There is no abdominal tenderness. There is no right CVA tenderness, left CVA tenderness, guarding or rebound.  Musculoskeletal:         General: No swelling or tenderness. Normal range of motion.     Cervical back: Normal range of motion. No rigidity or tenderness.     Comments: Bilateral lower extremities chronic edema  Lymphadenopathy:     Cervical: No cervical adenopathy.  Skin:    General: Skin is warm.     Coloration: Skin is not pale.     Findings: No bruising, erythema or rash.  Neurological:     Mental Status: She is alert and oriented to person, place, and time.     Cranial Nerves: No cranial nerve deficit.     Sensory: No sensory deficit.     Motor: No weakness.  Psychiatric:        Mood and Affect: Mood normal.        Behavior: Behavior normal.        Thought Content: Thought content normal.        Judgment: Judgment normal.     Labs reviewed: Recent Labs    02/27/19 1745 03/06/19 1119 03/16/19 2222 03/29/19 2206 12/08/19 1541  NA 142   < > 138 135 139  K 4.1   < > 4.5 4.2 5.1  CL 102   < > 103 100 108  CO2 29   < > 26 27 22   GLUCOSE 83   < > 102* 113* 91  BUN 30*   < > 38* 32* 28*  CREATININE 1.56*   < > 1.13* 1.12* 1.21*  CALCIUM 9.3   < > 8.7* 9.1 8.9  MG 2.0  --   --   --   --    < > = values in this interval not displayed.   Recent Labs    02/27/19 1745 03/06/19 1624 03/16/19 2222  AST 23 22 24   ALT 17 15 14   ALKPHOS 60 51 53  BILITOT 0.3 0.3 0.3  PROT 6.5 6.7 6.2*  ALBUMIN 3.5 3.4* 3.4*   Recent Labs    03/29/19 2206 05/31/19 1157 11/16/19 1331  WBC 2.9* 9.2 7.5  NEUTROABS 1.5* 7.0 5.1  HGB 9.6* 11.4* 12.2  HCT 30.2* 35.3* 36.9  MCV 88.3 83.0 91.9  PLT 155 284.0 236.0   Lab Results  Component Value Date   TSH 2.04 12/08/2019   No results found for: HGBA1C Lab Results  Component Value Date   CHOL  01/27/2010    144        ATP III CLASSIFICATION:  <200     mg/dL   Desirable  200-239  mg/dL   Borderline High  >=240    mg/dL   High          HDL 58 01/27/2010   LDLCALC  01/27/2010    72        Total Cholesterol/HDL:CHD Risk Coronary Heart Disease  Risk Table                     Men   Women  1/2 Average Risk   3.4   3.3  Average Risk       5.0   4.4  2 X Average Risk   9.6   7.1  3 X Average Risk  23.4   11.0        Use the calculated Patient Ratio above and the CHD Risk Table to determine the patient's CHD Risk.        ATP III CLASSIFICATION (LDL):  <100  mg/dL   Optimal  100-129  mg/dL   Near or Above                    Optimal  130-159  mg/dL   Borderline  160-189  mg/dL   High  >190     mg/dL   Very High   TRIG 68 01/27/2010   CHOLHDL 2.5 01/27/2010    Significant Diagnostic Results in last 30 days:  No results found.  Assessment/Plan  OBSTRUCTIVE SLEEP APNEA Negative exam finding.Reports apnea at night waking up gasping for air.Has a hx of OSA in the past.Request order for sleep study to be done at the hospital.discussed outpatient sleep study at [facility].Verblaized understanding aware study center will call her for appointment.  - Home sleep test  Family/ staff Communication: Reviewed plan of care with patient verbalized understanding   Labs/tests ordered: - Home sleep test  Next Appointment: As needed if symptoms worsen or fail to improve.  Sandrea Hughs, NP

## 2020-01-22 ENCOUNTER — Other Ambulatory Visit: Payer: Self-pay

## 2020-01-22 DIAGNOSIS — M79604 Pain in right leg: Secondary | ICD-10-CM

## 2020-01-22 DIAGNOSIS — M79605 Pain in left leg: Secondary | ICD-10-CM

## 2020-01-22 MED ORDER — TORSEMIDE 20 MG PO TABS: 20.0000 mg | ORAL_TABLET | Freq: Every day | ORAL | 1 refills | Status: DC

## 2020-01-22 NOTE — Telephone Encounter (Signed)
Patient called to request a refill. She has an OV scheduled for October, was just seen in the office last week.

## 2020-01-25 NOTE — Addendum Note (Signed)
Addended byMarlowe Sax C on: 01/25/2020 02:45 PM   Modules accepted: Orders

## 2020-01-30 ENCOUNTER — Other Ambulatory Visit: Payer: Self-pay

## 2020-01-30 MED ORDER — POTASSIUM CHLORIDE CRYS ER 20 MEQ PO TBCR: EXTENDED_RELEASE_TABLET | ORAL | 1 refills | Status: DC

## 2020-02-09 ENCOUNTER — Other Ambulatory Visit: Payer: Self-pay | Admitting: Nurse Practitioner

## 2020-02-09 DIAGNOSIS — E039 Hypothyroidism, unspecified: Secondary | ICD-10-CM

## 2020-02-12 ENCOUNTER — Other Ambulatory Visit: Payer: Self-pay | Admitting: Specialist

## 2020-02-15 ENCOUNTER — Ambulatory Visit (INDEPENDENT_AMBULATORY_CARE_PROVIDER_SITE_OTHER): Payer: Medicare PPO

## 2020-02-15 ENCOUNTER — Other Ambulatory Visit: Payer: Self-pay

## 2020-02-15 DIAGNOSIS — M810 Age-related osteoporosis without current pathological fracture: Secondary | ICD-10-CM | POA: Diagnosis not present

## 2020-02-15 MED ORDER — DENOSUMAB 60 MG/ML ~~LOC~~ SOSY
60.0000 mg | PREFILLED_SYRINGE | Freq: Once | SUBCUTANEOUS | Status: AC
  Administered 2020-02-15: 60 mg via SUBCUTANEOUS

## 2020-02-16 ENCOUNTER — Other Ambulatory Visit: Payer: Self-pay | Admitting: Specialist

## 2020-02-16 ENCOUNTER — Ambulatory Visit
Admission: RE | Admit: 2020-02-16 | Discharge: 2020-02-16 | Disposition: A | Discharge status: Home / Self Care | Payer: Medicare PPO | Source: Ambulatory Visit | Attending: Specialist | Admitting: Specialist

## 2020-02-16 DIAGNOSIS — Z9889 Other specified postprocedural states: Secondary | ICD-10-CM

## 2020-02-16 DIAGNOSIS — M542 Cervicalgia: Secondary | ICD-10-CM

## 2020-02-26 ENCOUNTER — Other Ambulatory Visit: Payer: Self-pay | Admitting: Specialist

## 2020-02-26 DIAGNOSIS — M542 Cervicalgia: Secondary | ICD-10-CM

## 2020-02-28 ENCOUNTER — Other Ambulatory Visit: Payer: Self-pay | Admitting: Nurse Practitioner

## 2020-02-28 DIAGNOSIS — E039 Hypothyroidism, unspecified: Secondary | ICD-10-CM

## 2020-02-29 ENCOUNTER — Telehealth: Payer: Self-pay

## 2020-02-29 MED ORDER — TIZANIDINE HCL 2 MG PO TABS: 2.0000 mg | ORAL_TABLET | Freq: Three times a day (TID) | ORAL | 1 refills | Status: DC | PRN

## 2020-02-29 NOTE — Telephone Encounter (Signed)
Patient called and left voicemail wanting refill on Tizanidine 2mg  tablet. Patient stated that she had been trying since Friday to get refill.

## 2020-03-18 ENCOUNTER — Ambulatory Visit
Admission: RE | Admit: 2020-03-18 | Discharge: 2020-03-18 | Disposition: A | Discharge status: Home / Self Care | Payer: Medicare PPO | Source: Ambulatory Visit | Attending: Specialist | Admitting: Specialist

## 2020-03-18 ENCOUNTER — Ambulatory Visit (INDEPENDENT_AMBULATORY_CARE_PROVIDER_SITE_OTHER): Payer: Medicare PPO | Admitting: Nurse Practitioner

## 2020-03-18 ENCOUNTER — Other Ambulatory Visit: Payer: Self-pay

## 2020-03-18 ENCOUNTER — Encounter: Payer: Self-pay | Admitting: Nurse Practitioner

## 2020-03-18 VITALS — BP 130/80 | HR 86 | Temp 96.8°F | Ht 63.0 in | Wt 142.0 lb

## 2020-03-18 DIAGNOSIS — M542 Cervicalgia: Secondary | ICD-10-CM

## 2020-03-18 DIAGNOSIS — R21 Rash and other nonspecific skin eruption: Secondary | ICD-10-CM | POA: Diagnosis not present

## 2020-03-18 MED ORDER — TRIAMCINOLONE ACETONIDE 0.025 % EX OINT: 1.0000 "application " | TOPICAL_OINTMENT | Freq: Two times a day (BID) | CUTANEOUS | 0 refills | Status: DC

## 2020-03-18 NOTE — Progress Notes (Signed)
Careteam: Patient Care Team: [name], NP as PCP - General (Geriatric Medicine) Alda Berthold, DO as Consulting Physician (Neurology)  PLACE OF SERVICE:  [place]  Advanced Directive information    Allergies  Allergen Reactions  . Tetracycline Nausea Only    Causes ringing in ears, dizziness, migraine, nausea  . Corticosteroids Other (See Comments)    12/02/2016 interview by Melissa Montane: Oral dosing causes migraines/nausea/tinnitus. Prev tolerated IV and intranasal admin w/o difficulty.  . Cefixime Other (See Comments)    Headache   . Ciprofloxacin Other (See Comments)    Headache, dizziness, ringing in the ears  . Doxycycline Other (See Comments)    unknown  . Gabapentin     Throat swells/closes  . Isometheptene-Dichloral-Apap Other (See Comments)    unknown  . Ketorolac     12/02/2016 interview by VEL: Oral dosing prednisone/corticosteroids causes migraines/nausea/tinnitus. Prev tolerated IV and intranasal admin w/o difficulty.  . Prednisone Other (See Comments)    Migraine, dizzy, ringing in ears-can take it IV 12/02/2016 interview by [name]: Oral prednisone dosing causes migraines/nausea/tinnitus. Prev tolerated IV and intranasal admin w/o difficulty.  . Promethazine Other (See Comments)    causes severe abdominal pain when taken IV; however she can take PO or IM  . Stadol [Butorphanol]     Cerebral pain  . Erythromycin Base Diarrhea and Itching    Severe diarrhea  . Propoxyphene Nausea Only    Chief Complaint  Patient presents with  . Acute Visit    Itching right upper arm, patient questions if she has shingles. Seen at [facility] yesterday.      HPI: Patient is a 74 y.o. female to follow up on itching.  She went to the urgent care yesterday due to intense itching to left upper arm. Reports she thought she had a bite there 10 days ago but there was no bumps or raised area. Over the last few days itching became very severe and intense so she  went to the urgent care where they gave her a solumedrol 135 mcg injection. Does not feel like it is benefiting her. She started using vinegar which has help take some of the intensity of the itch away. Now with a red confluent rash that started this morning.  Review of Systems:  Review of Systems  Constitutional: Negative for chills, fever, malaise/fatigue and weight loss.  Skin: Positive for itching and rash.    Past Medical History:  Diagnosis Date  . Allergy   . Anemia   . Arachnoiditis   . Bipolar disorder ([place])   . Blood transfusion without reported diagnosis   . Cataract    removed  . Chronic back pain   . Chronic pain   . Chronic post-thoracotomy pain   . CKD (chronic kidney disease)   . GERD (gastroesophageal reflux disease)   . Hypogammaglobulinemia ([place])   . Hypotension   . Hypothyroid   . 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 Lung cancer ([place]) 2011, 2014  . Memory loss   . Migraine   . Opioid abuse ([place])   . Osteoporosis   . Presence of neurostimulator   . Recurrent upper respiratory infection (URI)   . Restless legs   . Sleep apnea   . Tremor   . Urge incontinence   . Urticaria    long time ago, nothing recent   Past Surgical History:  Procedure Laterality Date  . ABDOMINAL HYSTERECTOMY    . CARPAL TUNNEL RELEASE    .  CATARACT EXTRACTION Bilateral 2019  . CHOLECYSTECTOMY    . COLONOSCOPY  2015   [facility]  . CT LUNG SCREENING  2018  . DG  BONE DENSITY ([facility] HX)  2018  . DIAGNOSTIC MAMMOGRAM  2019  . GASTRIC BYPASS    . LUNG CANCER SURGERY  02/2010  . MULTIPLE TOOTH EXTRACTIONS    . SPINAL CORD STIMULATOR IMPLANT     Social History:   reports that she has never smoked. She has never used smokeless tobacco. She reports previous alcohol use. She reports that she does not use drugs.  Family History  Problem Relation Age of Onset  . Hypertension Mother   . GER disease Mother   . Dementia Mother   . Osteoporosis Mother   . Arthritis Mother   .  Bipolar disorder Mother   . Parkinson's disease Father   . Congestive Heart Failure Father   . Pneumonia Father   . Arthritis Father   . Dementia Father   . Depression Father   . Asthma Sister   . Depression Sister   . Bipolar disorder Brother   . Asthma Daughter   . Colon cancer Neg Hx   . Esophageal cancer Neg Hx   . Stomach cancer Neg Hx   . Rectal cancer Neg Hx     Medications: Patient's Medications  New Prescriptions   No medications on file  Previous Medications   ACETAMINOPHEN (TYLENOL) 650 MG CR TABLET    Take 1,300 mg by mouth every 8 (eight) hours as needed for pain or fever.    AZELASTINE (ASTELIN) 0.1 % NASAL SPRAY    SMARTSIG:1 Spray(s) Both Nares 1 to 2 Times Daily PRN   BUPRENORPHINE HCL-NALOXONE HCL 4-1 MG FILM    Place 1 strip under the tongue 2 (two) times daily.   CALCIUM CITRATE 200 MG TABS    Take 1 tablet by mouth daily.    CARAFATE 1 GM/10ML SUSPENSION    As needed   CRANBERRY PO    Take 2 tablets by mouth in the morning and at bedtime.    CYCLOSPORINE (RESTASIS) 0.05 % OPHTHALMIC EMULSION    Place 1 drop into both eyes 2 (two) times daily.   D-MANNOSE PO    Take by mouth.   DEXLANSOPRAZOLE (DEXILANT) 60 MG CAPSULE    Take 1 capsule (60 mg total) by mouth daily. Crack open the capsule and pour into spoon prior to ingestion   ERENUMAB-AOOE (AIMOVIG, 140 MG DOSE,) 70 MG/ML SOAJ    Inject into the skin every 30 (thirty) days.   ESTRADIOL (ESTRACE) 0.1 MG/GM VAGINAL CREAM    3 (three) times a week.   FERROUS SULFATE 325 (65 FE) MG TABLET    Take 325 mg by mouth 2 (two) times daily with a meal.   IMMUNE GLOBULIN, HUMAN, (GAMMAGARD S/D LESS IGA) 10 G INJECTION    Inject into the vein every 21 ( twenty-one) days.    LAMOTRIGINE (LAMICTAL) 150 MG TABLET    Take 1 tablet (150 mg total) by mouth daily.   LASTACAFT 0.25 % SOLN    Apply 1 drop to eye daily.   LEVOTHYROXINE (SYNTHROID) 75 MCG TABLET    TAKE ONE (1) TABLET BY MOUTH EACH DAY BEFORE BREAKFAST   MAGNESIUM  OXIDE (MAG-OX) 400 MG TABLET    Take 400 mg by mouth daily.    POTASSIUM CHLORIDE SA (KLOR-CON) 20 MEQ TABLET    TAKE 2 TABLETS BY MOUTH DAILY (MAY DISSOLVE IN WATER)  ROPINIROLE (REQUIP) 1 MG TABLET    Take 1 tablet (1 mg total) by mouth at bedtime.   SAPHRIS 5 MG SUBL 24 HR TABLET    PLACE 1 TABLET UNDER THE TONGUE AT BEDTIME   TIZANIDINE (ZANAFLEX) 2 MG TABLET    Take 1 tablet (2 mg total) by mouth 3 (three) times daily as needed for muscle spasms.   TORSEMIDE (DEMADEX) 20 MG TABLET    Take 1 tablet (20 mg total) by mouth daily.  Modified Medications   No medications on file  Discontinued Medications   NA SULFATE-K SULFATE-MG SULF 17.5-3.13-1.6 GM/177ML SOLN    Dispense as written, no substitutions    Physical Exam:  Vitals:   03/18/20 1446  BP: 130/80  Pulse: 86  Temp: (!) 96.8 F (36 C)  TempSrc: Temporal  SpO2: 99%  Weight: 142 lb (64.4 kg)  Height: 5\' 3"  (1.6 m)   Body mass index is 25.15 kg/m. Wt Readings from Last 3 Encounters:  03/18/20 142 lb (64.4 kg)  01/19/20 140 lb 9.6 oz (63.8 kg)  01/01/20 137 lb (62.1 kg)    Physical Exam Constitutional:      Appearance: Normal appearance.  Skin:    General: Skin is warm and dry.     Findings: Rash (confluent marginally raised red rash to upper left inner arm) present.  Neurological:     Mental Status: She is alert.     Labs reviewed: Basic Metabolic Panel: Recent Labs    03/29/19 2206 12/08/19 1541  NA 135 139  K 4.2 5.1  CL 100 108  CO2 27 22  GLUCOSE 113* 91  BUN 32* 28*  CREATININE 1.12* 1.21*  CALCIUM 9.1 8.9  TSH  --  2.04   Liver Function Tests: No results for input(s): AST, ALT, ALKPHOS, BILITOT, PROT, ALBUMIN in the last 8760 hours. No results for input(s): LIPASE, AMYLASE in the last 8760 hours. No results for input(s): AMMONIA in the last 8760 hours. CBC: Recent Labs    03/29/19 2206 05/31/19 1157 11/16/19 1331  WBC 2.9* 9.2 7.5  NEUTROABS 1.5* 7.0 5.1  HGB 9.6* 11.4* 12.2  HCT  30.2* 35.3* 36.9  MCV 88.3 83.0 91.9  PLT 155 284.0 236.0   Lipid Panel: No results for input(s): CHOL, HDL, LDLCALC, TRIG, CHOLHDL, LDLDIRECT in the last 8760 hours. TSH: Recent Labs    12/08/19 1541  TSH 2.04   A1C: No results found for: HGBA1C   Assessment/Plan 1. Rash and nonspecific skin eruption -rash started this morning, not consistent with shingles.  -will have her use triamcinolone ointment twice daily to help with itching and rash -to notify for worsening of symptoms. - triamcinolone (KENALOG) 0.025 % ointment; Apply 1 application topically 2 (two) times daily.  Dispense: 30 g; Refill: 0  Next appt: 06/10/2020 as scheduled.  Carlos American. [place], [facility] [phone]

## 2020-03-19 ENCOUNTER — Other Ambulatory Visit: Payer: Self-pay | Admitting: Psychiatry

## 2020-03-19 DIAGNOSIS — F3162 Bipolar disorder, current episode mixed, moderate: Secondary | ICD-10-CM

## 2020-03-25 ENCOUNTER — Encounter: Payer: Self-pay | Admitting: Internal Medicine

## 2020-03-25 ENCOUNTER — Ambulatory Visit (INDEPENDENT_AMBULATORY_CARE_PROVIDER_SITE_OTHER): Payer: Medicare PPO | Admitting: Internal Medicine

## 2020-03-25 ENCOUNTER — Other Ambulatory Visit: Payer: Self-pay

## 2020-03-25 VITALS — BP 110/80 | HR 84 | Temp 96.9°F | Resp 16 | Ht 63.0 in | Wt 136.2 lb

## 2020-03-25 DIAGNOSIS — R197 Diarrhea, unspecified: Secondary | ICD-10-CM | POA: Diagnosis not present

## 2020-03-25 DIAGNOSIS — R300 Dysuria: Secondary | ICD-10-CM

## 2020-03-25 DIAGNOSIS — R110 Nausea: Secondary | ICD-10-CM

## 2020-03-25 LAB — POCT URINALYSIS DIPSTICK
Blood, UA: POSITIVE
Glucose, UA: NEGATIVE
Nitrite, UA: POSITIVE
Protein, UA: NEGATIVE
Spec Grav, UA: 1.02 (ref 1.010–1.025)
Urobilinogen, UA: NEGATIVE E.U./dL — AB
pH, UA: 5 (ref 5.0–8.0)

## 2020-03-25 MED ORDER — NITROFURANTOIN MONOHYD MACRO 100 MG PO CAPS: 100.0000 mg | ORAL_CAPSULE | Freq: Two times a day (BID) | ORAL | 0 refills | Status: DC

## 2020-03-25 NOTE — Patient Instructions (Signed)
Urinary Tract Infection, Adult A urinary tract infection (UTI) is an infection of any part of the urinary tract. The urinary tract includes:  The kidneys.  The ureters.  The bladder.  The urethra. These organs make, store, and get rid of pee (urine) in the body. What are the causes? This is caused by germs (bacteria) in your genital area. These germs grow and cause swelling (inflammation) of your urinary tract. What increases the risk? You are more likely to develop this condition if:  You have a small, thin tube (catheter) to drain pee.  You cannot control when you pee or poop (incontinence).  You are female, and: ? You use these methods to prevent pregnancy:  A medicine that kills sperm (spermicide).  A device that blocks sperm (diaphragm). ? You have low levels of a female hormone (estrogen). ? You are pregnant.  You have genes that add to your risk.  You are sexually active.  You take antibiotic medicines.  You have trouble peeing because of: ? A prostate that is bigger than normal, if you are female. ? A blockage in the part of your body that drains pee from the bladder (urethra). ? A kidney stone. ? A nerve condition that affects your bladder (neurogenic bladder). ? Not getting enough to drink. ? Not peeing often enough.  You have other conditions, such as: ? Diabetes. ? A weak disease-fighting system (immune system). ? Sickle cell disease. ? Gout. ? Injury of the spine. What are the signs or symptoms? Symptoms of this condition include:  Needing to pee right away (urgently).  Peeing often.  Peeing small amounts often.  Pain or burning when peeing.  Blood in the pee.  Pee that smells bad or not like normal.  Trouble peeing.  Pee that is cloudy.  Fluid coming from the vagina, if you are female.  Pain in the belly or lower back. Other symptoms include:  Throwing up (vomiting).  No urge to eat.  Feeling mixed up (confused).  Being tired  and grouchy (irritable).  A fever.  Watery poop (diarrhea). How is this treated? This condition may be treated with:  Antibiotic medicine.  Other medicines.  Drinking enough water. Follow these instructions at home:  Medicines  Take over-the-counter and prescription medicines only as told by your doctor.  If you were prescribed an antibiotic medicine, take it as told by your doctor. Do not stop taking it even if you start to feel better. General instructions  Make sure you: ? Pee until your bladder is empty. ? Do not hold pee for a long time. ? Empty your bladder after sex. ? Wipe from front to back after pooping if you are a female. Use each tissue one time when you wipe.  Drink enough fluid to keep your pee pale yellow.  Keep all follow-up visits as told by your doctor. This is important. Contact a doctor if:  You do not get better after 1-2 days.  Your symptoms go away and then come back. Get help right away if:  You have very bad back pain.  You have very bad pain in your lower belly.  You have a fever.  You are sick to your stomach (nauseous).  You are throwing up. Summary  A urinary tract infection (UTI) is an infection of any part of the urinary tract.  This condition is caused by germs in your genital area.  There are many risk factors for a UTI. These include having a small, thin   tube to drain pee and not being able to control when you pee or poop.  Treatment includes antibiotic medicines for germs.  Drink enough fluid to keep your pee pale yellow. This information is not intended to replace advice given to you by your health care provider. Make sure you discuss any questions you have with your health care provider. Document Revised: 07/21/2018 Document Reviewed: 02/10/2018 Elsevier Patient Education  2020 Elsevier Inc.  

## 2020-03-25 NOTE — Progress Notes (Signed)
Location:  [facility] clinic Provider: Shakela Donati L. Mariea Clonts, D.O., C.M.D. Goals of Care:  Advanced Directives 03/25/2020  Does Patient Have a Medical Advance Directive? No  Type of Advance Directive -  Does patient want to make changes to medical advance directive? No - Patient declined  Copy of [place] in Chart? -  Would patient like information on creating a medical advance directive? -     Chief Complaint  Patient presents with  . Acute Visit    Complains of UTI    HPI: Patient is a 74 y.o. female seen today for an acute visit for nausea, diarrhea, dysuria for a week.  Urine dip appears positive but she's been taking azo for 2 days--sun and today.   Her symptoms got really bad "Sunday morning at 4am.  Does ok drinking water.    She says she's had the nausea and diarrhea sometimes with a bladder infection.  Temp is ok.  Vitals are otherwise normal, too.  She's having to urinate nearly every 15 mins.  She's often been treated with macrobid.    Past Medical History:  Diagnosis Date  . Allergy   . Anemia   . Arachnoiditis   . Bipolar disorder (HCC)   . Blood transfusion without reported diagnosis   . Cataract    removed  . Chronic back pain   . Chronic pain   . Chronic post-thoracotomy pain   . CKD (chronic kidney disease)   . GERD (gastroesophageal reflux disease)   . Hypogammaglobulinemia (HCC)   . Hypotension   . Hypothyroid   . 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 Lung cancer (HCC) 2011, 2014  . Memory loss   . Migraine   . Opioid abuse (HCC)   . Osteoporosis   . Presence of neurostimulator   . Recurrent upper respiratory infection (URI)   . Restless legs   . Sleep apnea   . Tremor   . Urge incontinence   . Urticaria    long time ago, nothing recent    Past Surgical History:  Procedure Laterality Date  . ABDOMINAL HYSTERECTOMY    . CARPAL TUNNEL RELEASE    . CATARACT EXTRACTION Bilateral 2019  . CHOLECYSTECTOMY    . COLONOSCOPY   2015   [facility]  . CT LUNG SCREENING  2018  . DG  BONE DENSITY ([facility] HX)  2018  . DIAGNOSTIC MAMMOGRAM  2019  . GASTRIC BYPASS    . LUNG CANCER SURGERY  02/2010  . MULTIPLE TOOTH EXTRACTIONS    . SPINAL CORD STIMULATOR IMPLANT      Allergies  Allergen Reactions  . Tetracycline Nausea Only    Causes ringing in ears, dizziness, migraine, nausea  . Corticosteroids Other (See Comments)    12/02/2016 interview by [name]: Oral dosing causes migraines/nausea/tinnitus. Prev tolerated IV and intranasal admin w/o difficulty.  . Cefixime Other (See Comments)    Headache   . Ciprofloxacin Other (See Comments)    Headache, dizziness, ringing in the ears  . Doxycycline Other (See Comments)    unknown  . Gabapentin     Throat swells/closes  . Isometheptene-Dichloral-Apap Other (See Comments)    unknown  . Ketorolac     04" /18/2018 interview by [name]: Oral dosing prednisone/corticosteroids causes migraines/nausea/tinnitus. Prev tolerated IV and intranasal admin w/o difficulty.  . Prednisone Other (See Comments)    Migraine, dizzy, ringing in ears-can take it IV 12/02/2016 interview by [name]: Oral prednisone dosing causes migraines/nausea/tinnitus. Prev tolerated IV and intranasal  admin w/o difficulty.  . Promethazine Other (See Comments)    causes severe abdominal pain when taken IV; however she can take PO or IM  . Stadol [Butorphanol]     Cerebral pain  . Erythromycin Base Diarrhea and Itching    Severe diarrhea  . Propoxyphene Nausea Only    Outpatient Encounter Medications as of 03/25/2020  Medication Sig  . acetaminophen (TYLENOL) 650 MG CR tablet Take 1,300 mg by mouth every 8 (eight) hours as needed for pain or fever.   Marland Kitchen azelastine (ASTELIN) 0.1 % nasal spray SMARTSIG:1 Spray(s) Both Nares 1 to 2 Times Daily PRN  . Buprenorphine HCl-Naloxone HCl 4-1 MG FILM Place 1 strip under the tongue 2 (two) times daily.  . Calcium Citrate 200 MG TABS Take 1 tablet by mouth daily.   Marland Kitchen CARAFATE 1 GM/10ML  suspension As needed  . CRANBERRY PO Take 2 tablets by mouth in the morning and at bedtime.   . cycloSPORINE (RESTASIS) 0.05 % ophthalmic emulsion Place 1 drop into both eyes 2 (two) times daily.  . D-MANNOSE PO Take by mouth.  . dexlansoprazole (DEXILANT) 60 MG capsule Take 1 capsule (60 mg total) by mouth daily. Crack open the capsule and pour into spoon prior to ingestion  . Erenumab-aooe (AIMOVIG, 140 MG DOSE,) 70 MG/ML SOAJ Inject into the skin every 30 (thirty) days.  Marland Kitchen estradiol (ESTRACE) 0.1 MG/GM vaginal cream 3 (three) times a week.  . ferrous sulfate 325 (65 FE) MG tablet Take 325 mg by mouth 2 (two) times daily with a meal.  . immune globulin, human, (GAMMAGARD S/D LESS IGA) 10 g injection Inject into the vein every 21 ( twenty-one) days.   Marland Kitchen lamoTRIgine (LAMICTAL) 150 MG tablet TAKE ONE (1) TABLET BY MOUTH EVERY DAY  . LASTACAFT 0.25 % SOLN Apply 1 drop to eye daily.  Marland Kitchen levothyroxine (SYNTHROID) 75 MCG tablet TAKE ONE (1) TABLET BY MOUTH EACH DAY BEFORE BREAKFAST  . magnesium oxide (MAG-OX) 400 MG tablet Take 400 mg by mouth daily.   . potassium chloride SA (KLOR-CON) 20 MEQ tablet TAKE 2 TABLETS BY MOUTH DAILY (MAY DISSOLVE IN WATER)  . rOPINIRole (REQUIP) 1 MG tablet Take 1 tablet (1 mg total) by mouth at bedtime.  Marland Kitchen SAPHRIS 5 MG SUBL 24 hr tablet PLACE 1 TABLET UNDER THE TONGUE AT BEDTIME  . tiZANidine (ZANAFLEX) 2 MG tablet Take 1 tablet (2 mg total) by mouth 3 (three) times daily as needed for muscle spasms.  Marland Kitchen torsemide (DEMADEX) 20 MG tablet Take 1 tablet (20 mg total) by mouth daily.  Marland Kitchen triamcinolone (KENALOG) 0.025 % ointment Apply 1 application topically 2 (two) times daily.   No facility-administered encounter medications on file as of 03/25/2020.    Review of Systems:  Review of Systems  Constitutional: Negative for chills and fever.  Gastrointestinal: Positive for abdominal pain, diarrhea and nausea. Negative for constipation and vomiting.  Genitourinary: Positive  for dysuria, frequency and urgency. Negative for flank pain and hematuria.  Skin:       Rash resolved    Health Maintenance  Topic Date Due  . PNA vac Low Risk Adult (2 of 2 - PCV13) 07/29/2018  . INFLUENZA VACCINE  03/17/2020  . Hepatitis C Screening  12/07/2020 (Originally [birth year])  . MAMMOGRAM  02/12/2021  . COLONOSCOPY  01/01/2027  . TETANUS/TDAP  05/31/2027  . DEXA SCAN  Completed  . COVID-19 Vaccine  Completed    Physical Exam: Vitals:   03/25/20 1408  BP:  110/80  Pulse: 84  Resp: 16  Temp: (!) 96.9 F (36.1 C)  SpO2: 92%  Weight: 136 lb 3.2 oz (61.8 kg)  Height: 5\' 3"  (1.6 m)   Body mass index is 24.13 kg/m. Physical Exam Vitals reviewed.  Cardiovascular:     Rate and Rhythm: Normal rate and regular rhythm.  Pulmonary:     Effort: Pulmonary effort is normal.     Breath sounds: Normal breath sounds.  Abdominal:     General: Bowel sounds are normal.     Tenderness: There is abdominal tenderness.     Comments: Suprapubic tenderness  Musculoskeletal:        General: Normal range of motion.  Skin:    General: Skin is warm and dry.     Coloration: Skin is pale.  Neurological:     General: No focal deficit present.     Mental Status: She is alert. Mental status is at baseline.  Psychiatric:        Mood and Affect: Mood normal.     Labs reviewed: Basic Metabolic Panel: Recent Labs    03/29/19 2206 12/08/19 1541  NA 135 139  K 4.2 5.1  CL 100 108  CO2 27 22  GLUCOSE 113* 91  BUN 32* 28*  CREATININE 1.12* 1.21*  CALCIUM 9.1 8.9  TSH  --  2.04   Liver Function Tests: No results for input(s): AST, ALT, ALKPHOS, BILITOT, PROT, ALBUMIN in the last 8760 hours. No results for input(s): LIPASE, AMYLASE in the last 8760 hours. No results for input(s): AMMONIA in the last 8760 hours. CBC: Recent Labs    03/29/19 2206 05/31/19 1157 11/16/19 1331  WBC 2.9* 9.2 7.5  NEUTROABS 1.5* 7.0 5.1  HGB 9.6* 11.4* 12.2  HCT 30.2* 35.3* 36.9  MCV 88.3  83.0 91.9  PLT 155 284.0 236.0   Lipid Panel: No results for input(s): CHOL, HDL, LDLCALC, TRIG, CHOLHDL, LDLDIRECT in the last 8760 hours. No results found for: HGBA1C  Procedures since last visit: CT CERVICAL SPINE WO CONTRAST  Result Date: 03/18/2020 CLINICAL DATA:  Headache and neck pain.  Left shoulder pain. EXAM: CT CERVICAL SPINE WITHOUT CONTRAST TECHNIQUE: Multidetector CT imaging of the cervical spine was performed without intravenous contrast. Multiplanar CT image reconstructions were also generated. COMPARISON:  CT 05/29/2017. FINDINGS: Alignment: Normal Skull base and vertebrae: Normal Soft tissues and spinal canal: Neurostimulator in place in the dorsal midline extending from the T2-3 disc level to the bottom of the scan at upper T5. Disc levels: Foramen magnum is widely patent. Ordinary osteoarthritis at the C1-2 articulation without encroachment upon the neural structures. C2-3: 4 mm circumscribed low-density within the C2 vertebral body. Facet osteoarthritis left more than right. No compressive stenosis. C3-4: Facet osteoarthritis left more than right. No compressive stenosis. C4-5: Minimal uncovertebral prominence on the left. Mild left foraminal narrowing, not likely compressive. C5-6: Minimal uncovertebral prominence on the left. Bulging of the disc. No likely compressive canal or foraminal narrowing. C6-7: 4 mm well-circumscribed lucency within the C6 vertebral body. No canal or foraminal stenosis. C7-T1: Normal interspace. Upper thoracic region: No evidence of degenerative disc disease or degenerative facet disease. Upper chest: Normal Other: None IMPRESSION: Minor cervical degenerative changes as outlined above. No apparent neural compressive stenosis of the canal or foramina. The patient does have some facet osteoarthritis on the left at C2-3 and C3-4 which could possibly contribute to left-sided pain. At C5-6 there is mild uncovertebral prominence on the left and mild bulging of the  disc, but no likely compressive stenosis. 4 mm well-circumscribed lucencies within the C2 and C6 vertebral bodies. These are unchanged since 2248 and certainly benign. The mild degenerative changes outlined above do not appear changed since 2018. Electronically Signed   By: Nelson Chimes M.D.   On: 03/18/2020 12:57    Assessment/Plan 1. Dysuria -sounds like a true UTI and she's very symptomatic and miserable so will treat pending culture - Urine Culture - nitrofurantoin, macrocrystal-monohydrate, (MACROBID) 100 MG capsule; Take 1 capsule (100 mg total) by mouth 2 (two) times daily.  Dispense: 14 capsule; Refill: 0 - POC Urinalysis Dipstick  2. Nausea - due to infection - Urine Culture - nitrofurantoin, macrocrystal-monohydrate, (MACROBID) 100 MG capsule; Take 1 capsule (100 mg total) by mouth 2 (two) times daily.  Dispense: 14 capsule; Refill: 0  3. Diarrhea, unspecified type - suspect it is cause of UTI - Urine Culture - nitrofurantoin, macrocrystal-monohydrate, (MACROBID) 100 MG capsule; Take 1 capsule (100 mg total) by mouth 2 (two) times daily.  Dispense: 14 capsule; Refill: 0   Labs/tests ordered:   Lab Orders     Urine Culture     Extra Urine Specimen     POC Urinalysis Dipstick  Next appt:  06/10/2020  Laketra Bowdish L. Demeco Ducksworth, D.O. [facility] [address], [place] [address] Cell Phone (Mon-Fri 8am-5pm):  [phone] On Call:  [phone] & follow prompts after 5pm & weekends Office Phone:  [phone] Office Fax:  [phone]

## 2020-03-25 NOTE — Progress Notes (Signed)
Positive UA with symptoms, took azo, sent for culture.

## 2020-03-27 LAB — EXTRA URINE SPECIMEN

## 2020-03-27 LAB — URINE CULTURE
MICRO NUMBER:: 10802957
SPECIMEN QUALITY:: ADEQUATE

## 2020-03-27 NOTE — Progress Notes (Signed)
Hopefully she is feeling better.  She did have a UTI.  Her urine bacteria was susceptible to the macrodantin she was given.

## 2020-03-29 ENCOUNTER — Other Ambulatory Visit: Payer: Self-pay | Admitting: Psychiatry

## 2020-03-29 DIAGNOSIS — F419 Anxiety disorder, unspecified: Secondary | ICD-10-CM

## 2020-03-29 DIAGNOSIS — F5105 Insomnia due to other mental disorder: Secondary | ICD-10-CM

## 2020-03-29 DIAGNOSIS — F3162 Bipolar disorder, current episode mixed, moderate: Secondary | ICD-10-CM

## 2020-03-29 DIAGNOSIS — F99 Mental disorder, not otherwise specified: Secondary | ICD-10-CM

## 2020-04-03 ENCOUNTER — Other Ambulatory Visit: Payer: Self-pay

## 2020-04-03 DIAGNOSIS — F419 Anxiety disorder, unspecified: Secondary | ICD-10-CM

## 2020-04-03 DIAGNOSIS — F99 Mental disorder, not otherwise specified: Secondary | ICD-10-CM

## 2020-04-03 DIAGNOSIS — F3162 Bipolar disorder, current episode mixed, moderate: Secondary | ICD-10-CM

## 2020-04-03 DIAGNOSIS — F5105 Insomnia due to other mental disorder: Secondary | ICD-10-CM

## 2020-04-22 ENCOUNTER — Other Ambulatory Visit: Payer: Self-pay | Admitting: Psychiatry

## 2020-04-22 DIAGNOSIS — F3162 Bipolar disorder, current episode mixed, moderate: Secondary | ICD-10-CM

## 2020-04-23 ENCOUNTER — Telehealth: Payer: Self-pay | Admitting: Psychiatry

## 2020-04-23 NOTE — Telephone Encounter (Signed)
Please review

## 2020-04-23 NOTE — Telephone Encounter (Signed)
Pt called and is having some trouble with both her meds. Would like someone to call her. [phone]

## 2020-04-24 NOTE — Telephone Encounter (Signed)
Please call and get more information

## 2020-04-24 NOTE — Telephone Encounter (Signed)
Pt. Made aware. I sent her to admin to make an appt.

## 2020-04-24 NOTE — Telephone Encounter (Signed)
Megan Brewer, please review

## 2020-04-24 NOTE — Telephone Encounter (Signed)
Please advise 

## 2020-04-24 NOTE — Telephone Encounter (Signed)
Noted thank you

## 2020-04-24 NOTE — Telephone Encounter (Signed)
Spoke with patient and she reports that she feels as if she cant think, feel or do anything without it getting herself in trouble, has been getting upset watching tv commercials, crying spells, feeling down and doesn't know what to do. Feels hopeless. Has a good life and does not understand why she is feeling this way.

## 2020-05-02 ENCOUNTER — Other Ambulatory Visit: Payer: Self-pay | Admitting: Psychiatry

## 2020-05-02 DIAGNOSIS — F5105 Insomnia due to other mental disorder: Secondary | ICD-10-CM

## 2020-05-02 DIAGNOSIS — F3162 Bipolar disorder, current episode mixed, moderate: Secondary | ICD-10-CM

## 2020-05-02 DIAGNOSIS — F419 Anxiety disorder, unspecified: Secondary | ICD-10-CM

## 2020-05-02 DIAGNOSIS — F99 Mental disorder, not otherwise specified: Secondary | ICD-10-CM

## 2020-05-02 NOTE — Telephone Encounter (Signed)
Review.

## 2020-05-09 ENCOUNTER — Ambulatory Visit: Payer: Medicare PPO | Admitting: Psychiatry

## 2020-05-13 ENCOUNTER — Other Ambulatory Visit: Payer: Self-pay

## 2020-05-13 ENCOUNTER — Encounter: Payer: Self-pay | Admitting: Nurse Practitioner

## 2020-05-13 ENCOUNTER — Ambulatory Visit (INDEPENDENT_AMBULATORY_CARE_PROVIDER_SITE_OTHER): Payer: Medicare PPO | Admitting: Nurse Practitioner

## 2020-05-13 VITALS — BP 90/60 | HR 73 | Temp 98.0°F | Ht 63.0 in | Wt 134.8 lb

## 2020-05-13 DIAGNOSIS — M79671 Pain in right foot: Secondary | ICD-10-CM

## 2020-05-13 DIAGNOSIS — Z23 Encounter for immunization: Secondary | ICD-10-CM

## 2020-05-13 DIAGNOSIS — G894 Chronic pain syndrome: Secondary | ICD-10-CM | POA: Diagnosis not present

## 2020-05-13 DIAGNOSIS — M79672 Pain in left foot: Secondary | ICD-10-CM

## 2020-05-13 DIAGNOSIS — M79645 Pain in left finger(s): Secondary | ICD-10-CM

## 2020-05-13 NOTE — Progress Notes (Signed)
Careteam: Patient Care Team: [name], NP as PCP - General (Geriatric Medicine) Alda Berthold, DO as Consulting Physician (Neurology)  PLACE OF SERVICE:  [place]  Advanced Directive information    Allergies  Allergen Reactions  . Tetracycline Nausea Only    Causes ringing in ears, dizziness, migraine, nausea  . Corticosteroids Other (See Comments)    12/02/2016 interview by Melissa Montane: Oral dosing causes migraines/nausea/tinnitus. Prev tolerated IV and intranasal admin w/o difficulty.  . Cefixime Other (See Comments)    Headache   . Ciprofloxacin Other (See Comments)    Headache, dizziness, ringing in the ears  . Doxycycline Other (See Comments)    unknown  . Gabapentin     Throat swells/closes  . Isometheptene-Dichloral-Apap Other (See Comments)    unknown  . Ketorolac     12/02/2016 interview by [facility]: Oral dosing prednisone/corticosteroids causes migraines/nausea/tinnitus. Prev tolerated IV and intranasal admin w/o difficulty.  . Prednisone Other (See Comments)    Migraine, dizzy, ringing in ears-can take it IV 12/02/2016 interview by [name]: Oral prednisone dosing causes migraines/nausea/tinnitus. Prev tolerated IV and intranasal admin w/o difficulty.  . Promethazine Other (See Comments)    causes severe abdominal pain when taken IV; however she can take PO or IM  . Stadol [Butorphanol]     Cerebral pain  . Erythromycin Base Diarrhea and Itching    Severe diarrhea  . Propoxyphene Nausea Only    Chief Complaint  Patient presents with  . Acute Visit    Patient would like referral to Orthopedic. Patient states she is having pain in ball of feet, and pain in left hand and arm. She has been experiencing for several months, but has gotten worse in last couple of weeks. Patient has been using BC goody powder for pain, but with no relief.Starting to cause trouble with walking and being able to do things with hand.     HPI: Patient is a 74 y.o. female due to pain.  Pt with hx of chronic pain and sees pain specialist.   She reports increase in pain to the bottom of her left foot. Reports she is having trouble walking due to this.  Reports it has been worse over the last several month. Rest, ice and ASA but continues to get worse. Significantly worse 10 days ago.  No injury noted. Making it hard to walk.  Now pain in right foot that is progressively getting worse.  Has seen a podiatrist in the past but been over 10 years since she has gone to one. Reports she has been told that she has flat feet with a high in-step. Gave her an insert for her shoes at the time and now she does not have this. Reports this insert would effect her gait.   Reports pain in left arm- reports she wakes up and left 2 fingers are frozen and she has to use her right hand to move her fingers.  Reports base of thumb is painful, sharp pain. Pain 10/10 to base of the thumb. No injury. Denies any repetitive  movement to thumb.   Requesting a referral for orthopedic in [place] for both of her issues- reports she wants to go to the same place her husband goes to and would like a referral there.   Requesting flu shot today  Chronic pain management continues her on the suboxone due to her opoid addiction from pain due to lung cancer.   Reports her oncologist is at [facility] and  she was told next follow up was in 7 years.    Review of Systems:  Review of Systems  Constitutional: Negative for chills, fever and weight loss.  HENT: Negative for tinnitus.   Respiratory: Negative for cough, sputum production and shortness of breath.   Cardiovascular: Negative for chest pain, palpitations and leg swelling.  Gastrointestinal: Negative for abdominal pain, constipation, diarrhea and heartburn.  Genitourinary: Negative for frequency.  Musculoskeletal: Positive for joint pain and myalgias. Negative for back pain, falls and neck pain.  Skin: Negative.     Past Medical History:  Diagnosis  Date  . Allergy   . Anemia   . Arachnoiditis   . Bipolar disorder ([place])   . Blood transfusion without reported diagnosis   . Cataract    removed  . Chronic back pain   . Chronic pain   . Chronic post-thoracotomy pain   . CKD (chronic kidney disease)   . GERD (gastroesophageal reflux disease)   . Hypogammaglobulinemia ([place])   . Hypotension   . Hypothyroid   . 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 Lung cancer ([place]) 2011, 2014  . Memory loss   . Migraine   . Opioid abuse ([place])   . Osteoporosis   . Presence of neurostimulator   . Recurrent upper respiratory infection (URI)   . Restless legs   . Sleep apnea   . Tremor   . Urge incontinence   . Urticaria    long time ago, nothing recent   Past Surgical History:  Procedure Laterality Date  . ABDOMINAL HYSTERECTOMY    . CARPAL TUNNEL RELEASE    . CATARACT EXTRACTION Bilateral 2019  . CHOLECYSTECTOMY    . COLONOSCOPY  2015   [facility]  . CT LUNG SCREENING  2018  . DG  BONE DENSITY ([place] HX)  2018  . DIAGNOSTIC MAMMOGRAM  2019  . GASTRIC BYPASS    . LUNG CANCER SURGERY  02/2010  . MULTIPLE TOOTH EXTRACTIONS    . SPINAL CORD STIMULATOR IMPLANT     Social History:   reports that she has never smoked. She has never used smokeless tobacco. She reports previous alcohol use. She reports that she does not use drugs.  Family History  Problem Relation Age of Onset  . Hypertension Mother   . GER disease Mother   . Dementia Mother   . Osteoporosis Mother   . Arthritis Mother   . Bipolar disorder Mother   . Parkinson's disease Father   . Congestive Heart Failure Father   . Pneumonia Father   . Arthritis Father   . Dementia Father   . Depression Father   . Asthma Sister   . Depression Sister   . Bipolar disorder Brother   . Asthma Daughter   . Colon cancer Neg Hx   . Esophageal cancer Neg Hx   . Stomach cancer Neg Hx   . Rectal cancer Neg Hx     Medications: Patient's Medications  New Prescriptions   No  medications on file  Previous Medications   ACETAMINOPHEN (TYLENOL) 650 MG CR TABLET    Take 1,300 mg by mouth every 8 (eight) hours as needed for pain or fever.    AZELASTINE (ASTELIN) 0.1 % NASAL SPRAY    SMARTSIG:1 Spray(s) Both Nares 1 to 2 Times Daily PRN   BUPRENORPHINE HCL-NALOXONE HCL 4-1 MG FILM    Place 1 strip under the tongue 2 (two) times daily.   CALCIUM CITRATE 200 MG TABS  Take 1 tablet by mouth daily.    CIPROFLOXACIN (CIPRO) 500 MG TABLET    SMARTSIG:1 Tablet(s) By Mouth Every 12 Hours   CRANBERRY PO    Take 2 tablets by mouth in the morning and at bedtime.    CYCLOSPORINE (RESTASIS) 0.05 % OPHTHALMIC EMULSION    Place 1 drop into both eyes 2 (two) times daily.   D-MANNOSE PO    Take by mouth.   DEXLANSOPRAZOLE (DEXILANT) 60 MG CAPSULE    Take 1 capsule (60 mg total) by mouth daily. Crack open the capsule and pour into spoon prior to ingestion   ERENUMAB-AOOE (AIMOVIG, 140 MG DOSE,) 70 MG/ML SOAJ    Inject into the skin every 30 (thirty) days.   ESTRADIOL (ESTRACE) 0.1 MG/GM VAGINAL CREAM    3 (three) times a week.   FERROUS SULFATE 325 (65 FE) MG TABLET    Take 325 mg by mouth 2 (two) times daily with a meal.   IMMUNE GLOBULIN, HUMAN, (GAMMAGARD S/D LESS IGA) 10 G INJECTION    Inject into the vein every 21 ( twenty-one) days.    LAMOTRIGINE (LAMICTAL) 150 MG TABLET    TAKE ONE (1) TABLET BY MOUTH EVERY DAY   LASTACAFT 0.25 % SOLN    Apply 1 drop to eye daily.   LEVOTHYROXINE (SYNTHROID) 75 MCG TABLET    TAKE ONE (1) TABLET BY MOUTH EACH DAY BEFORE BREAKFAST   MAGNESIUM OXIDE (MAG-OX) 400 MG TABLET    Take 400 mg by mouth daily.    POTASSIUM CHLORIDE SA (KLOR-CON) 20 MEQ TABLET    TAKE 2 TABLETS BY MOUTH DAILY (MAY DISSOLVE IN WATER)   ROPINIROLE (REQUIP) 1 MG TABLET    Take 1 tablet (1 mg total) by mouth at bedtime.   SAPHRIS 5 MG SUBL 24 HR TABLET    PLACE 1 TABLET UNDER THE TONGUE AT BEDTIME   SULFAMETHOXAZOLE-TRIMETHOPRIM (BACTRIM DS) 800-160 MG TABLET    SMARTSIG:1  Tablet(s) By Mouth Every 12 Hours   TIZANIDINE (ZANAFLEX) 2 MG TABLET    Take 1 tablet (2 mg total) by mouth 3 (three) times daily as needed for muscle spasms.   TORSEMIDE (DEMADEX) 20 MG TABLET    Take 1 tablet (20 mg total) by mouth daily.  Modified Medications   No medications on file  Discontinued Medications   CARAFATE 1 GM/10ML SUSPENSION    As needed   NITROFURANTOIN, MACROCRYSTAL-MONOHYDRATE, (MACROBID) 100 MG CAPSULE    Take 1 capsule (100 mg total) by mouth 2 (two) times daily.   TRIAMCINOLONE (KENALOG) 0.025 % OINTMENT    Apply 1 application topically 2 (two) times daily.    Physical Exam:  Vitals:   05/13/20 1029  BP: 90/60  Pulse: 73  Temp: 98 F (36.7 C)  TempSrc: Temporal  SpO2: 98%  Weight: 134 lb 12.8 oz (61.1 kg)  Height: 5\' 3"  (1.6 m)   Body mass index is 23.88 kg/m. Wt Readings from Last 3 Encounters:  05/13/20 134 lb 12.8 oz (61.1 kg)  03/25/20 136 lb 3.2 oz (61.8 kg)  03/18/20 142 lb (64.4 kg)    Physical Exam Constitutional:      General: She is not in acute distress.    Appearance: She is well-developed. She is not diaphoretic.  HENT:     Head: Normocephalic and atraumatic.     Mouth/Throat:     Pharynx: No oropharyngeal exudate.  Eyes:     Conjunctiva/sclera: Conjunctivae normal.     Pupils: Pupils are equal,  round, and reactive to light.  Cardiovascular:     Rate and Rhythm: Normal rate and regular rhythm.     Heart sounds: Normal heart sounds.  Pulmonary:     Effort: Pulmonary effort is normal.     Breath sounds: Normal breath sounds.  Abdominal:     General: Bowel sounds are normal.     Palpations: Abdomen is soft.  Musculoskeletal:     Cervical back: Normal range of motion and neck supple.     Right foot: Tenderness present. No swelling or deformity.     Left foot: Tenderness present. No swelling or deformity.     Comments: Tenderness to 3-5 MTP joints on bilateral feet, no swelling, heat or bruising noted.   Tenderness to CBC  joint to left hand, no swelling, heat or bruising.   Skin:    General: Skin is warm and dry.  Neurological:     Mental Status: She is alert and oriented to person, place, and time.     Labs reviewed: Basic Metabolic Panel: Recent Labs    12/08/19 1541  NA 139  K 5.1  CL 108  CO2 22  GLUCOSE 91  BUN 28*  CREATININE 1.21*  CALCIUM 8.9  TSH 2.04   Liver Function Tests: No results for input(s): AST, ALT, ALKPHOS, BILITOT, PROT, ALBUMIN in the last 8760 hours. No results for input(s): LIPASE, AMYLASE in the last 8760 hours. No results for input(s): AMMONIA in the last 8760 hours. CBC: Recent Labs    05/31/19 1157 11/16/19 1331  WBC 9.2 7.5  NEUTROABS 7.0 5.1  HGB 11.4* 12.2  HCT 35.3* 36.9  MCV 83.0 91.9  PLT 284.0 236.0   Lipid Panel: No results for input(s): CHOL, HDL, LDLCALC, TRIG, CHOLHDL, LDLDIRECT in the last 8760 hours. TSH: Recent Labs    12/08/19 1541  TSH 2.04   A1C: No results found for: HGBA1C   Assessment/Plan 1. Bilateral foot pain -tenderness to multiple MTP joints on bilateral feet, no swelling bruising or heat noted.  - AMB referral to orthopedics  2. Pain of left thumb -paint to the left thumb CMC joint. Would like evaluation by orthopedic at thi stime - AMB referral to orthopedics  3. Chronic pain syndrome -continues to follow up with pain clinic on chronic suboxone.   4. Need for influenza vaccination - Flu Vaccine QUAD High Dose(Fluad)  Next appt: as scheduled next months.  Carlos American. [place], [facility] [phone]

## 2020-05-13 NOTE — Patient Instructions (Signed)
To keep scheduled follow up for next month

## 2020-05-14 ENCOUNTER — Other Ambulatory Visit: Payer: Self-pay | Admitting: Nurse Practitioner

## 2020-05-14 DIAGNOSIS — E039 Hypothyroidism, unspecified: Secondary | ICD-10-CM

## 2020-05-27 ENCOUNTER — Other Ambulatory Visit: Payer: Self-pay | Admitting: Psychiatry

## 2020-05-27 DIAGNOSIS — F3162 Bipolar disorder, current episode mixed, moderate: Secondary | ICD-10-CM

## 2020-06-03 ENCOUNTER — Encounter: Payer: Self-pay | Admitting: Nurse Practitioner

## 2020-06-03 ENCOUNTER — Other Ambulatory Visit: Payer: Self-pay

## 2020-06-03 ENCOUNTER — Ambulatory Visit (INDEPENDENT_AMBULATORY_CARE_PROVIDER_SITE_OTHER): Payer: Medicare PPO | Admitting: Nurse Practitioner

## 2020-06-03 VITALS — BP 132/80 | HR 84 | Temp 96.2°F | Ht 63.0 in | Wt 135.0 lb

## 2020-06-03 DIAGNOSIS — L84 Corns and callosities: Secondary | ICD-10-CM

## 2020-06-03 DIAGNOSIS — L03115 Cellulitis of right lower limb: Secondary | ICD-10-CM

## 2020-06-03 DIAGNOSIS — S51812A Laceration without foreign body of left forearm, initial encounter: Secondary | ICD-10-CM

## 2020-06-03 MED ORDER — SULFAMETHOXAZOLE-TRIMETHOPRIM 800-160 MG PO TABS: 1.0000 | ORAL_TABLET | Freq: Two times a day (BID) | ORAL | 0 refills | Status: DC

## 2020-06-03 NOTE — Progress Notes (Signed)
Careteam: Patient Care Team: [name], NP as PCP - General (Geriatric Medicine) Alda Berthold, DO as Consulting Physician (Neurology)  PLACE OF SERVICE:  [place]  Advanced Directive information    Allergies  Allergen Reactions  . Tetracycline Nausea Only    Causes ringing in ears, dizziness, migraine, nausea  . Corticosteroids Other (See Comments)    12/02/2016 interview by Melissa Montane: Oral dosing causes migraines/nausea/tinnitus. Prev tolerated IV and intranasal admin w/o difficulty.  . Cefixime Other (See Comments)    Headache   . Ciprofloxacin Other (See Comments)    Headache, dizziness, ringing in the ears  . Doxycycline Other (See Comments)    unknown  . Gabapentin     Throat swells/closes  . Isometheptene-Dichloral-Apap Other (See Comments)    unknown  . Ketorolac     12/02/2016 interview by [facility]: Oral dosing prednisone/corticosteroids causes migraines/nausea/tinnitus. Prev tolerated IV and intranasal admin w/o difficulty.  . Prednisone Other (See Comments)    Migraine, dizzy, ringing in ears-can take it IV 12/02/2016 interview by [name]: Oral prednisone dosing causes migraines/nausea/tinnitus. Prev tolerated IV and intranasal admin w/o difficulty.  . Promethazine Other (See Comments)    causes severe abdominal pain when taken IV; however she can take PO or IM  . Stadol [Butorphanol]     Cerebral pain  . Erythromycin Base Diarrhea and Itching    Severe diarrhea  . Propoxyphene Nausea Only    Chief Complaint  Patient presents with  . Follow-up    Follow-up on left arm injury that occured on 05/27/2020. Patient was seen at [facility] on 05/30/2020      HPI: Patient is a 74 y.o. female to follow up skin tear.  She stumbling going to the bathroom at 4 am and hit the door jam causing a skin tear. Reports it was not improving quick enough so she wanted it evaluated so she went to urgent care.  Overall doing much better. Not oozing as much.   Right heel has been  causing her increase in pain. Using a file to remove a callus, now area is red, painful and swollen.   Review of Systems:  Review of Systems  Respiratory: Negative for shortness of breath.   Cardiovascular: Positive for leg swelling (chronic and stable). Negative for chest pain.  Gastrointestinal: Negative for abdominal pain, nausea and vomiting.  Musculoskeletal:       Pain noted to right heel  Skin: Negative for itching and rash.       Skin tears to forearm left and right    Past Medical History:  Diagnosis Date  . Allergy   . Anemia   . Arachnoiditis   . Bipolar disorder ([place])   . Blood transfusion without reported diagnosis   . Cataract    removed  . Chronic back pain   . Chronic pain   . Chronic post-thoracotomy pain   . CKD (chronic kidney disease)   . GERD (gastroesophageal reflux disease)   . Hypogammaglobulinemia ([place])   . Hypotension   . Hypothyroid   . 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 Lung cancer ([place]) 2011, 2014  . Memory loss   . Migraine   . Opioid abuse ([place])   . Osteoporosis   . Presence of neurostimulator   . Recurrent upper respiratory infection (URI)   . Restless legs   . Sleep apnea   . Tremor   . Urge incontinence   . Urticaria    long time ago, nothing recent  Past Surgical History:  Procedure Laterality Date  . ABDOMINAL HYSTERECTOMY    . CARPAL TUNNEL RELEASE    . CATARACT EXTRACTION Bilateral 2019  . CHOLECYSTECTOMY    . COLONOSCOPY  2015   [facility]  . CT LUNG SCREENING  2018  . DG  BONE DENSITY ([place] HX)  2018  . DIAGNOSTIC MAMMOGRAM  2019  . GASTRIC BYPASS    . LUNG CANCER SURGERY  02/2010  . MULTIPLE TOOTH EXTRACTIONS    . SPINAL CORD STIMULATOR IMPLANT     Social History:   reports that she has never smoked. She has never used smokeless tobacco. She reports previous alcohol use. She reports that she does not use drugs.  Family History  Problem Relation Age of Onset  . Hypertension Mother   . GER disease Mother    . Dementia Mother   . Osteoporosis Mother   . Arthritis Mother   . Bipolar disorder Mother   . Parkinson's disease Father   . Congestive Heart Failure Father   . Pneumonia Father   . Arthritis Father   . Dementia Father   . Depression Father   . Asthma Sister   . Depression Sister   . Bipolar disorder Brother   . Asthma Daughter   . Colon cancer Neg Hx   . Esophageal cancer Neg Hx   . Stomach cancer Neg Hx   . Rectal cancer Neg Hx     Medications: Patient's Medications  New Prescriptions   No medications on file  Previous Medications   ACETAMINOPHEN (TYLENOL) 650 MG CR TABLET    Take 1,300 mg by mouth every 8 (eight) hours as needed for pain or fever.    AZELASTINE (ASTELIN) 0.1 % NASAL SPRAY    SMARTSIG:1 Spray(s) Both Nares 1 to 2 Times Daily PRN   BUPRENORPHINE HCL-NALOXONE HCL 4-1 MG FILM    Place 1 strip under the tongue 2 (two) times daily.   CALCIUM CITRATE 200 MG TABS    Take 1 tablet by mouth daily.    CELECOXIB (CELEBREX) 100 MG CAPSULE    Take 100 mg by mouth daily.   CYCLOSPORINE (RESTASIS) 0.05 % OPHTHALMIC EMULSION    Place 1 drop into both eyes 2 (two) times daily.   D-MANNOSE PO    Take by mouth.   DEXLANSOPRAZOLE (DEXILANT) 60 MG CAPSULE    Take 1 capsule (60 mg total) by mouth daily. Crack open the capsule and pour into spoon prior to ingestion   ERENUMAB-AOOE (AIMOVIG, 140 MG DOSE,) 70 MG/ML SOAJ    Inject into the skin every 30 (thirty) days.   ESTRADIOL (ESTRACE) 0.1 MG/GM VAGINAL CREAM    3 (three) times a week.   FERROUS SULFATE 325 (65 FE) MG TABLET    Take 325 mg by mouth 2 (two) times daily with a meal.   IMMUNE GLOBULIN, HUMAN, (GAMMAGARD S/D LESS IGA) 10 G INJECTION    Inject into the vein every 21 ( twenty-one) days.    LAMOTRIGINE (LAMICTAL) 150 MG TABLET    TAKE ONE (1) TABLET BY MOUTH EVERY DAY   LASTACAFT 0.25 % SOLN    Apply 1 drop to eye daily.   LEVOTHYROXINE (SYNTHROID) 75 MCG TABLET    TAKE ONE (1) TABLET BY MOUTH EACH DAY BEFORE  BREAKFAST   MAGNESIUM OXIDE (MAG-OX) 400 MG TABLET    Take 400 mg by mouth daily.    POTASSIUM CHLORIDE SA (KLOR-CON) 20 MEQ TABLET    TAKE 2 TABLETS BY  MOUTH DAILY (MAY DISSOLVE IN WATER)   ROPINIROLE (REQUIP) 1 MG TABLET    Take 1 tablet (1 mg total) by mouth at bedtime.   SAPHRIS 5 MG SUBL 24 HR TABLET    PLACE 1 TABLET UNDER THE TONGUE AT BEDTIME   TIZANIDINE (ZANAFLEX) 2 MG TABLET    Take 1 tablet (2 mg total) by mouth 3 (three) times daily as needed for muscle spasms.   TORSEMIDE (DEMADEX) 20 MG TABLET    Take 1 tablet (20 mg total) by mouth daily.  Modified Medications   No medications on file  Discontinued Medications   CIPROFLOXACIN (CIPRO) 500 MG TABLET    SMARTSIG:1 Tablet(s) By Mouth Every 12 Hours   CRANBERRY PO    Take 2 tablets by mouth in the morning and at bedtime.    SULFAMETHOXAZOLE-TRIMETHOPRIM (BACTRIM DS) 800-160 MG TABLET    SMARTSIG:1 Tablet(s) By Mouth Every 12 Hours    Physical Exam:  Vitals:   06/03/20 1456  BP: 132/80  Pulse: 84  Temp: (!) 96.2 F (35.7 C)  TempSrc: Temporal  SpO2: 98%  Weight: 135 lb (61.2 kg)  Height: 5\' 3"  (1.6 m)   Body mass index is 23.91 kg/m. Wt Readings from Last 3 Encounters:  06/03/20 135 lb (61.2 kg)  05/13/20 134 lb 12.8 oz (61.1 kg)  03/25/20 136 lb 3.2 oz (61.8 kg)    Physical Exam Constitutional:      Appearance: Normal appearance.  HENT:     Head: Normocephalic and atraumatic.  Cardiovascular:     Rate and Rhythm: Normal rate and regular rhythm.     Pulses: Normal pulses.  Pulmonary:     Effort: Pulmonary effort is normal.     Breath sounds: Normal breath sounds.  Musculoskeletal:     Comments: Right heel with redness, swelling, small open area noted to base of heel, no drainage noted.   Skin:    Comments: Skin tear noted to left forearm 2.5 cm x 1.25, red base. No redness, drainage or tenderness or swelling noted around area.  Right forearm with skin tear and skin flap covers area, she has bandaid over  skin tear, no redness, drainage or swelling or tenderness noted.   Neurological:     Mental Status: She is alert.     Labs reviewed: Basic Metabolic Panel: Recent Labs    12/08/19 1541  NA 139  K 5.1  CL 108  CO2 22  GLUCOSE 91  BUN 28*  CREATININE 1.21*  CALCIUM 8.9  TSH 2.04   Liver Function Tests: No results for input(s): AST, ALT, ALKPHOS, BILITOT, PROT, ALBUMIN in the last 8760 hours. No results for input(s): LIPASE, AMYLASE in the last 8760 hours. No results for input(s): AMMONIA in the last 8760 hours. CBC: Recent Labs    11/16/19 1331  WBC 7.5  NEUTROABS 5.1  HGB 12.2  HCT 36.9  MCV 91.9  PLT 236.0   Lipid Panel: No results for input(s): CHOL, HDL, LDLCALC, TRIG, CHOLHDL, LDLDIRECT in the last 8760 hours. TSH: Recent Labs    12/08/19 1541  TSH 2.04   A1C: No results found for: HGBA1C   Assessment/Plan 1. Cellulitis of right lower extremity -redness, swelling and painful heel around callused area.  Will treat for cellulitis at this time -warm epsom salt soas 2-3 times daily, dry thoroughly.   - sulfamethoxazole-trimethoprim (BACTRIM DS) 800-160 MG tablet; Take 1 tablet by mouth 2 (two) times daily.  Dispense: 14 tablet; Refill: 0  2. Callus  of heel -noted, encouraged not to try to remove callus at this time and will have podiatry evaluate.  - Ambulatory referral to Podiatry  3. Skin tear of left forearm without complication, initial encounter -healing well. Encouraged to use xeroform dressing until completely healed. Cleaned ares today and applied dressing.   Next appt: 06/10/2020 as scheduled, sooner if needed for worsening redness, swelling, fever or pain  Megan Brewer K. [place], [facility] [phone]

## 2020-06-03 NOTE — Patient Instructions (Signed)
Epsom salt warm water soak to right foot 2-3 times daily   To take bactrim ds twice daily with food for 7 days and probotic  podiatry referral for callus.    To use xeroform gauze until area heals.

## 2020-06-04 ENCOUNTER — Other Ambulatory Visit: Payer: Self-pay | Admitting: Psychiatry

## 2020-06-04 DIAGNOSIS — F419 Anxiety disorder, unspecified: Secondary | ICD-10-CM

## 2020-06-04 DIAGNOSIS — F5105 Insomnia due to other mental disorder: Secondary | ICD-10-CM

## 2020-06-04 DIAGNOSIS — F3162 Bipolar disorder, current episode mixed, moderate: Secondary | ICD-10-CM

## 2020-06-04 DIAGNOSIS — F99 Mental disorder, not otherwise specified: Secondary | ICD-10-CM

## 2020-06-10 ENCOUNTER — Encounter: Payer: Self-pay | Admitting: Nurse Practitioner

## 2020-06-10 ENCOUNTER — Ambulatory Visit (INDEPENDENT_AMBULATORY_CARE_PROVIDER_SITE_OTHER): Payer: Medicare PPO | Admitting: Nurse Practitioner

## 2020-06-10 ENCOUNTER — Other Ambulatory Visit: Payer: Self-pay | Admitting: Nurse Practitioner

## 2020-06-10 ENCOUNTER — Other Ambulatory Visit: Payer: Self-pay

## 2020-06-10 VITALS — BP 112/64 | HR 67 | Temp 96.4°F | Ht 63.0 in | Wt 134.8 lb

## 2020-06-10 DIAGNOSIS — D803 Selective deficiency of immunoglobulin G [IgG] subclasses: Secondary | ICD-10-CM

## 2020-06-10 DIAGNOSIS — H6122 Impacted cerumen, left ear: Secondary | ICD-10-CM | POA: Diagnosis not present

## 2020-06-10 DIAGNOSIS — M810 Age-related osteoporosis without current pathological fracture: Secondary | ICD-10-CM

## 2020-06-10 DIAGNOSIS — Z23 Encounter for immunization: Secondary | ICD-10-CM | POA: Diagnosis not present

## 2020-06-10 DIAGNOSIS — D649 Anemia, unspecified: Secondary | ICD-10-CM

## 2020-06-10 DIAGNOSIS — E039 Hypothyroidism, unspecified: Secondary | ICD-10-CM | POA: Diagnosis not present

## 2020-06-10 DIAGNOSIS — S51812A Laceration without foreign body of left forearm, initial encounter: Secondary | ICD-10-CM

## 2020-06-10 DIAGNOSIS — R293 Abnormal posture: Secondary | ICD-10-CM

## 2020-06-10 DIAGNOSIS — Z1159 Encounter for screening for other viral diseases: Secondary | ICD-10-CM

## 2020-06-10 DIAGNOSIS — G894 Chronic pain syndrome: Secondary | ICD-10-CM

## 2020-06-10 DIAGNOSIS — G8922 Chronic post-thoracotomy pain: Secondary | ICD-10-CM

## 2020-06-10 DIAGNOSIS — R600 Localized edema: Secondary | ICD-10-CM

## 2020-06-10 DIAGNOSIS — L03115 Cellulitis of right lower limb: Secondary | ICD-10-CM

## 2020-06-10 DIAGNOSIS — M79672 Pain in left foot: Secondary | ICD-10-CM

## 2020-06-10 DIAGNOSIS — G2581 Restless legs syndrome: Secondary | ICD-10-CM

## 2020-06-10 DIAGNOSIS — M79671 Pain in right foot: Secondary | ICD-10-CM

## 2020-06-10 NOTE — Progress Notes (Signed)
Careteam: Patient Care Team: [name], NP as PCP - General (Geriatric Medicine) Alda Berthold, DO as Consulting Physician (Neurology)  PLACE OF SERVICE:  [place] Directive information Does Patient Have a Medical Advance Directive?: No, Would patient like information on creating a medical advance directive?: No - Patient declined  Allergies  Allergen Reactions  . Tetracycline Nausea Only    Causes ringing in ears, dizziness, migraine, nausea  . Corticosteroids Other (See Comments)    12/02/2016 interview by Melissa Montane: Oral dosing causes migraines/nausea/tinnitus. Prev tolerated IV and intranasal admin w/o difficulty.  . Cefixime Other (See Comments)    Headache   . Ciprofloxacin Other (See Comments)    Headache, dizziness, ringing in the ears  . Doxycycline Other (See Comments)    unknown  . Gabapentin     Throat swells/closes  . Isometheptene-Dichloral-Apap Other (See Comments)    unknown  . Ketorolac     12/02/2016 interview by PPJ: Oral dosing prednisone/corticosteroids causes migraines/nausea/tinnitus. Prev tolerated IV and intranasal admin w/o difficulty.  . Prednisone Other (See Comments)    Migraine, dizzy, ringing in ears-can take it IV 12/02/2016 interview by [name]: Oral prednisone dosing causes migraines/nausea/tinnitus. Prev tolerated IV and intranasal admin w/o difficulty.  . Promethazine Other (See Comments)    causes severe abdominal pain when taken IV; however she can take PO or IM  . Stadol [Butorphanol]     Cerebral pain  . Erythromycin Base Diarrhea and Itching    Severe diarrhea  . Propoxyphene Nausea Only    No chief complaint on file.    HPI: Patient is a 74 y.o. female for routine follow up  Reports right heel is improving. Reports redness, tenderness and swelling is all better. Continues on bactrim. She has on compression hose and prefers not to remove them to assess heel as she has a hard time removing and putting back on.  States it is better.   Anxiety/depression/bipolar- feels like mood could be better, seeing psych next week.   Anemia- continues on iron.   LE edema- continues to wear compression hose, using torsemide 20 mg daily   Headaches- followed by neurology   Vaginal dryness- continues on estrace three times weekly by GYN.   No recent UTIs   Pain in both feet- Dr Linton Rump gave her rx for celebrex for 1 month   Reports tremor for several years, over the last year has gotten worse in duration, frequency and intensity. Still is able to feed and dress herself.   Review of Systems:  Review of Systems  Constitutional: Negative for chills, fever and weight loss.  HENT: Negative for tinnitus.   Respiratory: Negative for cough, sputum production and shortness of breath.   Cardiovascular: Negative for chest pain, palpitations and leg swelling.  Gastrointestinal: Negative for abdominal pain, constipation, diarrhea and heartburn.  Genitourinary: Negative for dysuria, frequency and urgency.  Musculoskeletal: Negative for back pain, falls, joint pain and myalgias.  Skin: Negative.   Neurological: Positive for headaches. Negative for dizziness.  Psychiatric/Behavioral: Positive for depression. Negative for memory loss. The patient is nervous/anxious. The patient does not have insomnia.     Past Medical History:  Diagnosis Date  . Allergy   . Anemia   . Arachnoiditis   . Bipolar disorder ([place])   . Blood transfusion without reported diagnosis   . Cataract    removed  . Chronic back pain   . Chronic pain   . Chronic post-thoracotomy pain   .  CKD (chronic kidney disease)   . GERD (gastroesophageal reflux disease)   . Hypogammaglobulinemia ([place])   . Hypotension   . Hypothyroid   . 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 Lung cancer ([place]) 2011, 2014  . Memory loss   . Migraine   . Opioid abuse ([place])   . Osteoporosis   . Presence of neurostimulator   . Recurrent upper respiratory infection  (URI)   . Restless legs   . Sleep apnea   . Tremor   . Urge incontinence   . Urticaria    long time ago, nothing recent   Past Surgical History:  Procedure Laterality Date  . ABDOMINAL HYSTERECTOMY    . CARPAL TUNNEL RELEASE    . CATARACT EXTRACTION Bilateral 2019  . CHOLECYSTECTOMY    . COLONOSCOPY  2015   [facility]  . CT LUNG SCREENING  2018  . DG  BONE DENSITY ([place] HX)  2018  . DIAGNOSTIC MAMMOGRAM  2019  . GASTRIC BYPASS    . LUNG CANCER SURGERY  02/2010  . MULTIPLE TOOTH EXTRACTIONS    . SPINAL CORD STIMULATOR IMPLANT     Social History:   reports that she has never smoked. She has never used smokeless tobacco. She reports previous alcohol use. She reports that she does not use drugs.  Family History  Problem Relation Age of Onset  . Hypertension Mother   . GER disease Mother   . Dementia Mother   . Osteoporosis Mother   . Arthritis Mother   . Bipolar disorder Mother   . Parkinson's disease Father   . Congestive Heart Failure Father   . Pneumonia Father   . Arthritis Father   . Dementia Father   . Depression Father   . Asthma Sister   . Depression Sister   . Bipolar disorder Brother   . Asthma Daughter   . Colon cancer Neg Hx   . Esophageal cancer Neg Hx   . Stomach cancer Neg Hx   . Rectal cancer Neg Hx     Medications: Patient's Medications  New Prescriptions   No medications on file  Previous Medications   ACETAMINOPHEN (TYLENOL) 650 MG CR TABLET    Take 1,300 mg by mouth every 8 (eight) hours as needed for pain or fever.    AZELASTINE (ASTELIN) 0.1 % NASAL SPRAY    SMARTSIG:1 Spray(s) Both Nares 1 to 2 Times Daily PRN   BUPRENORPHINE HCL-NALOXONE HCL 4-1 MG FILM    Place 1 strip under the tongue 2 (two) times daily.   CALCIUM CITRATE 200 MG TABS    Take 1 tablet by mouth daily.    CELECOXIB (CELEBREX) 100 MG CAPSULE    Take 100 mg by mouth daily.   CYCLOSPORINE (RESTASIS) 0.05 % OPHTHALMIC EMULSION    Place 1 drop into both eyes 2 (two) times daily.    D-MANNOSE PO    Take by mouth.   DEXLANSOPRAZOLE (DEXILANT) 60 MG CAPSULE    Take 1 capsule (60 mg total) by mouth daily. Crack open the capsule and pour into spoon prior to ingestion   ERENUMAB-AOOE (AIMOVIG, 140 MG DOSE,) 70 MG/ML SOAJ    Inject into the skin every 30 (thirty) days.   ESTRADIOL (ESTRACE) 0.1 MG/GM VAGINAL CREAM    3 (three) times a week.   FERROUS SULFATE 325 (65 FE) MG TABLET    Take 325 mg by mouth 2 (two) times daily with a meal.   IMMUNE GLOBULIN, HUMAN, (GAMMAGARD S/D LESS  IGA) 10 G INJECTION    Inject into the vein every 21 ( twenty-one) days.    LAMOTRIGINE (LAMICTAL) 150 MG TABLET    TAKE ONE (1) TABLET BY MOUTH EVERY DAY   LASTACAFT 0.25 % SOLN    Apply 1 drop to eye daily.   LEVOTHYROXINE (SYNTHROID) 75 MCG TABLET    TAKE ONE (1) TABLET BY MOUTH EACH DAY BEFORE BREAKFAST   MAGNESIUM OXIDE (MAG-OX) 400 MG TABLET    Take 400 mg by mouth daily.    POTASSIUM CHLORIDE SA (KLOR-CON) 20 MEQ TABLET    TAKE 2 TABLETS BY MOUTH DAILY (MAY DISSOLVE IN WATER)   ROPINIROLE (REQUIP) 1 MG TABLET    Take 1 tablet (1 mg total) by mouth at bedtime.   SAPHRIS 5 MG SUBL 24 HR TABLET    PLACE 1 TABLET UNDER THE TONGUE AT BEDTIME   TIZANIDINE (ZANAFLEX) 2 MG TABLET    Take 1 tablet (2 mg total) by mouth 3 (three) times daily as needed for muscle spasms.   TORSEMIDE (DEMADEX) 20 MG TABLET    Take 1 tablet (20 mg total) by mouth daily.  Modified Medications   No medications on file  Discontinued Medications   SULFAMETHOXAZOLE-TRIMETHOPRIM (BACTRIM DS) 800-160 MG TABLET    Take 1 tablet by mouth 2 (two) times daily.    Physical Exam:  Vitals:   06/10/20 1452  BP: 112/64  Pulse: 67  Temp: (!) 96.4 F (35.8 C)  TempSrc: Temporal  SpO2: 97%  Weight: 134 lb 12.8 oz (61.1 kg)  Height: $Remove'5\' 3"'[id number]$  (1.6 m)   There is no height or weight on file to calculate BMI. Wt Readings from Last 3 Encounters:  06/03/20 135 lb (61.2 kg)  05/13/20 134 lb 12.8 oz (61.1 kg)  03/25/20 136 lb 3.2 oz (61.8  kg)    Physical Exam Constitutional:      General: She is not in acute distress.    Appearance: She is well-developed. She is not diaphoretic.  HENT:     Head: Normocephalic and atraumatic.     Left Ear: There is impacted cerumen.     Mouth/Throat:     Pharynx: No oropharyngeal exudate.  Eyes:     Conjunctiva/sclera: Conjunctivae normal.     Pupils: Pupils are equal, round, and reactive to light.  Cardiovascular:     Rate and Rhythm: Normal rate and regular rhythm.     Heart sounds: Normal heart sounds.  Pulmonary:     Effort: Pulmonary effort is normal.     Breath sounds: Normal breath sounds.  Abdominal:     General: Bowel sounds are normal.     Palpations: Abdomen is soft.  Musculoskeletal:        General: No tenderness.     Cervical back: Normal range of motion and neck supple.  Skin:    General: Skin is warm and dry.  Neurological:     Mental Status: She is alert and oriented to person, place, and time.     Labs reviewed: Basic Metabolic Panel: Recent Labs    12/08/19 1541  NA 139  K 5.1  CL 108  CO2 22  GLUCOSE 91  BUN 28*  CREATININE 1.21*  CALCIUM 8.9  TSH 2.04   Liver Function Tests: No results for input(s): AST, ALT, ALKPHOS, BILITOT, PROT, ALBUMIN in the last 8760 hours. No results for input(s): LIPASE, AMYLASE in the last 8760 hours. No results for input(s): AMMONIA in the last 8760 hours. CBC: Recent  Labs    11/16/19 1331  WBC 7.5  NEUTROABS 5.1  HGB 12.2  HCT 36.9  MCV 91.9  PLT 236.0   Lipid Panel: No results for input(s): CHOL, HDL, LDLCALC, TRIG, CHOLHDL, LDLDIRECT in the last 8760 hours. TSH: Recent Labs    12/08/19 1541  TSH 2.04   A1C: No results found for: HGBA1C   Assessment/Plan 1. Need for hepatitis C screening test - Hepatitis C antibody  2. Osteoporosis, unspecified osteoporosis type, unspecified pathological fracture presence Continues on prolia, cal and vit d weight bearing exercises encouraged.  - CBC with  Differential/Platelet  3. Acquired hypothyroidism -continues on synthroid 75 mcg  - TSH  4. Lower leg edema Ongoing, continues on compression hose with Demadex and potassium supplement.   5. Cellulitis of right lower extremity Reports redness, swelling and pain improving, decline an assessment today as she is wearing compression hose and does not want to remove. Educated to follow up if symptoms worsen, redness, pain, swelling worsens.   6. Chronic pain syndrome Ongoing, continues to follow up with pain management. Continues on suboxone daily  7. RLS (restless legs syndrome) Stable, continues on requip.  - CBC with Differential/Platelet - CMP with eGFR([facility])  8. Anemia, unspecified type - CBC with Differential/Platelet  9. Pain in both feet Ongoing, following with orthopedic, has placed her on celebrex at this time. Caution advised due to CKD, will recheck today  10. Need for 23-polyvalent pneumococcal polysaccharide vaccine - Pneumococcal polysaccharide vaccine 23-valent greater than or equal to 2yo subcutaneous/IM  11. Impacted cerumen of left ear Removed via ear lavage and grabber, pt tolerated well.   12. Skin tear of left forearm without complication, initial encounter Healing well at this time. Continue to use clean well and xeroform dressing and change q 3 days. To notify for signs of infection or not healing.  13. Chronic post-thoracotomy pain - Ambulatory referral to Physical Therapy  14. Abnormal posture -reports harder time to stand up straight with weakness in back and core, request PT - Ambulatory referral to Physical Therapy  15. 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   Next appt: 6 months.  Carlos American. [place], [facility] [phone]

## 2020-06-11 ENCOUNTER — Other Ambulatory Visit: Payer: Self-pay

## 2020-06-11 LAB — CBC WITH DIFFERENTIAL/PLATELET
Absolute Monocytes: 505 cells/uL (ref 200–950)
Basophils Absolute: 122 cells/uL (ref 0–200)
Basophils Relative: 1.4 %
Eosinophils Absolute: 122 cells/uL (ref 15–500)
Eosinophils Relative: 1.4 %
HCT: 45.1 % — ABNORMAL HIGH (ref 35.0–45.0)
Hemoglobin: 14.8 g/dL (ref 11.7–15.5)
Lymphs Abs: 1940 cells/uL (ref 850–3900)
MCH: 29.4 pg (ref 27.0–33.0)
MCHC: 32.8 g/dL (ref 32.0–36.0)
MCV: 89.5 fL (ref 80.0–100.0)
MPV: 10.2 fL (ref 7.5–12.5)
Monocytes Relative: 5.8 %
Neutro Abs: 6012 cells/uL (ref 1500–7800)
Neutrophils Relative %: 69.1 %
Platelets: 257 10*3/uL (ref 140–400)
RBC: 5.04 10*6/uL (ref 3.80–5.10)
RDW: 12.2 % (ref 11.0–15.0)
Total Lymphocyte: 22.3 %
WBC: 8.7 10*3/uL (ref 3.8–10.8)

## 2020-06-11 LAB — COMPLETE METABOLIC PANEL WITH GFR
AG Ratio: 1.4 (calc) (ref 1.0–2.5)
ALT: 19 U/L (ref 6–29)
AST: 29 U/L (ref 10–35)
Albumin: 4.1 g/dL (ref 3.6–5.1)
Alkaline phosphatase (APISO): 56 U/L (ref 37–153)
BUN/Creatinine Ratio: 20 (calc) (ref 6–22)
BUN: 31 mg/dL — ABNORMAL HIGH (ref 7–25)
CO2: 23 mmol/L (ref 20–32)
Calcium: 9.1 mg/dL (ref 8.6–10.4)
Chloride: 101 mmol/L (ref 98–110)
Creat: 1.57 mg/dL — ABNORMAL HIGH (ref 0.60–0.93)
GFR, Est African American: 38 mL/min/{1.73_m2} — ABNORMAL LOW (ref >=60)
GFR, Est Non African American: 33 mL/min/{1.73_m2} — ABNORMAL LOW (ref >=60)
Globulin: 2.9 g/dL (calc) (ref 1.9–3.7)
Glucose, Bld: 92 mg/dL (ref 65–139)
Potassium: 4.9 mmol/L (ref 3.5–5.3)
Sodium: 136 mmol/L (ref 135–146)
Total Bilirubin: 0.2 mg/dL (ref 0.2–1.2)
Total Protein: 7 g/dL (ref 6.1–8.1)

## 2020-06-11 LAB — TSH: TSH: 5.05 mIU/L — ABNORMAL HIGH (ref 0.40–4.50)

## 2020-06-11 LAB — HEPATITIS C ANTIBODY
Hepatitis C Ab: NONREACTIVE
SIGNAL TO CUT-OFF: 0.01 (ref <1.00)

## 2020-06-11 MED ORDER — ACETAMINOPHEN 500 MG PO TABS: 500.0000 mg | ORAL_TABLET | Freq: Three times a day (TID) | ORAL | Status: DC | PRN

## 2020-06-14 ENCOUNTER — Other Ambulatory Visit: Payer: Self-pay

## 2020-06-14 ENCOUNTER — Encounter: Payer: Self-pay | Admitting: Psychiatry

## 2020-06-14 ENCOUNTER — Ambulatory Visit (INDEPENDENT_AMBULATORY_CARE_PROVIDER_SITE_OTHER): Payer: Medicare PPO | Admitting: Psychiatry

## 2020-06-14 DIAGNOSIS — F3162 Bipolar disorder, current episode mixed, moderate: Secondary | ICD-10-CM

## 2020-06-14 DIAGNOSIS — F5105 Insomnia due to other mental disorder: Secondary | ICD-10-CM | POA: Diagnosis not present

## 2020-06-14 DIAGNOSIS — F419 Anxiety disorder, unspecified: Secondary | ICD-10-CM | POA: Diagnosis not present

## 2020-06-14 DIAGNOSIS — F99 Mental disorder, not otherwise specified: Secondary | ICD-10-CM | POA: Diagnosis not present

## 2020-06-14 MED ORDER — LAMOTRIGINE 200 MG PO TABS: 200.0000 mg | ORAL_TABLET | Freq: Every day | ORAL | 1 refills | Status: DC

## 2020-06-14 MED ORDER — ASENAPINE MALEATE 2.5 MG SL SUBL: SUBLINGUAL_TABLET | SUBLINGUAL | 0 refills | Status: DC

## 2020-06-14 NOTE — Progress Notes (Signed)
   06/14/20 0919  Facial and Oral Movements  Muscles of Facial Expression 0  Lips and Perioral Area 0  Jaw 0  Tongue 0  Extremity Movements  Upper (arms, wrists, hands, fingers) 3  Lower (legs, knees, ankles, toes) 1  Trunk Movements  Neck, shoulders, hips 1  Overall Severity  Severity of abnormal movements (highest score from questions above) 3  Incapacitation due to abnormal movements 3  Patient's awareness of abnormal movements (rate only patient's report) 4  Dental Status  Current problems with teeth and/or dentures? No  AIMS Total Score  AIMS Total Score 15

## 2020-06-14 NOTE — Patient Instructions (Signed)
Decrease Saphris to 2.5 mg samples, one tablet under your tongue at bedtime for 10 days, then stop. Saphris may be causing involuntary movements, so shaking may improve with stopping Saphris.   Increase Lamotrigine (Lamictal) to one 200 mg tablet daily. Prescription was sent to [facility] for the 200 mg tablets.   Please continue all other medications.   Will re-evaluate in 4 weeks.

## 2020-06-14 NOTE — Progress Notes (Signed)
Megan Brewer [id number] April 20, 1946 74 y.o.  Subjective:   Patient ID:  Megan Brewer is a 74 y.o. (DOB [birth year]) female.  Chief Complaint:  Chief Complaint  Patient presents with  . Medication Problem    Possible tardive dyskinesia, tremors    HPI Megan Brewer presents to the office today for follow-up of anxiety, depression, and insomnia, and new onset of involuntary movements and tremors. She reports that she started having tremors earlier this year and in July tremors worsened. She reports that her tremors are now "out of control." Sh reports that tremors are interfering with ADL's. She reports that she has been shaking "all over." She reports that she has difficulty swallowing and has dry mouth. She reports that she has been feeling unsteady, as if "on a teeter totter." She reports that she is having to expend considerable effort to control her shakes.   She reports that her anxiety/ "terror attacks" have been more frequent and longer in duration. She reports that the last one lasted about 6 hours and awakened her out of sleep. The attacks are now occurring every 10-14 days. She describes attacks as "intense" and occur without trigger or an identifiable pattern. She reports increased HR and shortness of breath with these attacks. She reports that her anxious thoughts occur only when she is having the attacks or is focused on shaking.   She reports that her depression is deeper. She reports irritability in response to the shaking. She is awakening every couple of hours. She typically sleeps about 4-6 hours out of a 24-hour period. She has had unintentional 15 lbs wt loss. She reports that her energy and motivation have been low. She reports that daily tasks now require significantly more effort. She reports anhedonia. She reports that chronic suicidal ideation is less now. Denies suicidal intent.  Denies any recent risky or impulsive behaviors. Denies AH or VH. Denies  paranoia.    Past Psychiatric Medication Trials: Sertraline- Was effective for about 25 years and no longer seems to be as effective.  Prozac Paxil  Cymbalta- may have caused psychosis. Depakote Trileptal- shaking and night sweats Gabapentin Lithium- Edema. Minimally effective Seroquel Risperidone Zyprexa- Effective for insomnia, anxiety, and mood. Excessive wt gain. Saphris Lamotrigine Melatonin Ativan- Reports taking until 2 months ago.  Valium- "great for my depression." Xanax Trazodone- Ineffective Requip- Took for years at 0.5 mg po qd.   AIMS     Office Visit from 06/14/2020 in [place] Visit from 06/23/2019 in [place] Total Score 15 0    PHQ2-9     Office Visit from 06/10/2020 in [facility] Visit from 09/25/2019 in [facility] Visit from 07/05/2019 in [place]  PHQ-2 Total Score 0 0 0       Review of Systems:  Review of Systems  Musculoskeletal: Positive for gait problem.       Foot pain  Neurological: Positive for tremors.  Psychiatric/Behavioral:       Please refer to HPI    Medications: I have reviewed the patient's current medications.  Current Outpatient Medications  Medication Sig Dispense Refill  . acetaminophen (TYLENOL) 500 MG tablet Take 1-2 tablets (500-1,000 mg total) by mouth every 8 (eight) hours as needed (Max dose 3000 mg in 24 hours).    Marland Kitchen azelastine (ASTELIN) 0.1 % nasal spray SMARTSIG:1 Spray(s) Both Nares 1 to 2 Times Daily PRN    . Buprenorphine HCl-Naloxone HCl 4-1  MG FILM Place 1 strip under the tongue 2 (two) times daily.    . Calcium Citrate 200 MG TABS Take 1 tablet by mouth daily.     . D-MANNOSE PO Take by mouth.    . dexlansoprazole (DEXILANT) 60 MG capsule Take 1 capsule (60 mg total) by mouth daily. Crack open the capsule and pour into spoon prior to ingestion 30 capsule 5  . Erenumab-aooe (AIMOVIG, 140 MG DOSE,) 70 MG/ML SOAJ  Inject into the skin every 30 (thirty) days.    Marland Kitchen estradiol (ESTRACE) 0.1 MG/GM vaginal cream 3 (three) times a week.    . ferrous sulfate 325 (65 FE) MG tablet Take 325 mg by mouth 2 (two) times daily with a meal.    . immune globulin, human, (GAMMAGARD S/D LESS IGA) 10 g injection Inject into the vein every 21 ( twenty-one) days.     Marland Kitchen levothyroxine (SYNTHROID) 75 MCG tablet TAKE ONE (1) TABLET BY MOUTH EACH DAY BEFORE BREAKFAST 90 tablet 0  . magnesium oxide (MAG-OX) 400 MG tablet Take 400 mg by mouth daily.     . potassium chloride SA (KLOR-CON) 20 MEQ tablet TAKE TWO TABLETS BY MOUTH DAILY (MAY DISSOLVE IN WATER) 180 tablet 1  . rOPINIRole (REQUIP) 1 MG tablet Take 1 tablet (1 mg total) by mouth at bedtime. 90 tablet 3  . tiZANidine (ZANAFLEX) 2 MG tablet Take 1 tablet (2 mg total) by mouth 3 (three) times daily as needed for muscle spasms. 30 tablet 1  . torsemide (DEMADEX) 20 MG tablet Take 1 tablet (20 mg total) by mouth daily. 90 tablet 1  . Asenapine Maleate (SAPHRIS) 2.5 MG SUBL Take 1 tab sublingual at bedtime for 10 days, then stop. 10 tablet 0  . cycloSPORINE (RESTASIS) 0.05 % ophthalmic emulsion Place 1 drop into both eyes 2 (two) times daily.    Marland Kitchen lamoTRIgine (LAMICTAL) 200 MG tablet Take 1 tablet (200 mg total) by mouth daily. 30 tablet 1  . LASTACAFT 0.25 % SOLN Apply 1 drop to eye daily.     No current facility-administered medications for this visit.    Medication Side Effects: Other: Tremor, involuntary movements.   Allergies:  Allergies  Allergen Reactions  . Tetracycline Nausea Only    Causes ringing in ears, dizziness, migraine, nausea  . Corticosteroids Other (See Comments)    12/02/2016 interview by Melissa Montane: Oral dosing causes migraines/nausea/tinnitus. Prev tolerated IV and intranasal admin w/o difficulty.  . Cefixime Other (See Comments)    Headache   . Ciprofloxacin Other (See Comments)    Headache, dizziness, ringing in the ears  . Doxycycline Other (See  Comments)    unknown  . Gabapentin     Throat swells/closes  . Isometheptene-Dichloral-Apap Other (See Comments)    unknown  . Ketorolac     12/02/2016 interview by WEX: Oral dosing prednisone/corticosteroids causes migraines/nausea/tinnitus. Prev tolerated IV and intranasal admin w/o difficulty.  . Prednisone Other (See Comments)    Migraine, dizzy, ringing in ears-can take it IV 12/02/2016 interview by [name]: Oral prednisone dosing causes migraines/nausea/tinnitus. Prev tolerated IV and intranasal admin w/o difficulty.  . Promethazine Other (See Comments)    causes severe abdominal pain when taken IV; however she can take PO or IM  . Stadol [Butorphanol]     Cerebral pain  . Erythromycin Base Diarrhea and Itching    Severe diarrhea  . Propoxyphene Nausea Only    Past Medical History:  Diagnosis Date  . Allergy   . Anemia   .  Arachnoiditis   . Bipolar disorder ([place])   . Blood transfusion without reported diagnosis   . Cataract    removed  . Chronic back pain   . Chronic pain   . Chronic post-thoracotomy pain   . CKD (chronic kidney disease)   . GERD (gastroesophageal reflux disease)   . Hypogammaglobulinemia ([place])   . Hypotension   . Hypothyroid   . 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 Lung cancer ([place]) 2011, 2014  . Memory loss   . Migraine   . Opioid abuse ([place])   . Osteoporosis   . Presence of neurostimulator   . Recurrent upper respiratory infection (URI)   . Restless legs   . Sleep apnea   . Tremor   . Urge incontinence   . Urticaria    long time ago, nothing recent    Family History  Problem Relation Age of Onset  . Hypertension Mother   . GER disease Mother   . Dementia Mother   . Osteoporosis Mother   . Arthritis Mother   . Bipolar disorder Mother   . Parkinson's disease Father   . Congestive Heart Failure Father   . Pneumonia Father   . Arthritis Father   . Dementia Father   . Depression Father   . Asthma Sister   . Depression Sister    . Bipolar disorder Brother   . Asthma Daughter   . Colon cancer Neg Hx   . Esophageal cancer Neg Hx   . Stomach cancer Neg Hx   . Rectal cancer Neg Hx     Social History   Socioeconomic History  . Marital status: Married    Spouse name: Not on file  . Number of children: 2  . Years of education: Not on file  . Highest education level: GED or equivalent  Occupational History  . Occupation: retired  Tobacco Use  . Smoking status: Never Smoker  . Smokeless tobacco: Never Used  Vaping Use  . Vaping Use: Never used  Substance and Sexual Activity  . Alcohol use: Not Currently  . Drug use: Never  . Sexual activity: Not on file  Other Topics Concern  . Not on file  Social History Narrative   Diet: None      Caffeine: coffee, tea, sodas less than or 1 daily.      Married, if yes what year: Yes, 1972      Do you live in a house, apartment, assisted living, condo, trailer, ect: Two story house       Pets: None      Current/Past profession: Teach      Exercise: None         Living Will: No   DNR: No   POA/HPOA: No      Functional Status:   Do you have difficulty bathing or dressing yourself? yes   Do you have difficulty preparing food or eating? yes   Do you have difficulty managing your medications? yes   Do you have difficulty managing your finances?yes   Do you have difficulty affording your medications? no   Social Determinants of Health   Financial Resource Strain:   . Difficulty of Paying Living Expenses: Not on file  Food Insecurity:   . Worried About Charity fundraiser in the Last Year: Not on file  . Ran Out of Food in the Last Year: Not on file  Transportation Needs:   . Lack of Transportation (Medical): Not on file  .  Lack of Transportation (Non-Medical): Not on file  Physical Activity:   . Days of Exercise per Week: Not on file  . Minutes of Exercise per Session: Not on file  Stress:   . Feeling of Stress : Not on file  Social Connections:    . Frequency of Communication with Friends and Family: Not on file  . Frequency of Social Gatherings with Friends and Family: Not on file  . Attends Religious Services: Not on file  . Active Member of Clubs or Organizations: Not on file  . Attends Archivist Meetings: Not on file  . Marital Status: Not on file  Intimate Partner Violence:   . Fear of Current or Ex-Partner: Not on file  . Emotionally Abused: Not on file  . Physically Abused: Not on file  . Sexually Abused: Not on file    Past Medical History, Surgical history, Social history, and Family history were reviewed and updated as appropriate.   Please see review of systems for further details on the patient's review from today.   Objective:   Physical Exam:  There were no vitals taken for this visit.  Physical Exam Constitutional:      General: She is not in acute distress. Musculoskeletal:        General: No deformity.  Neurological:     Mental Status: She is alert and oriented to person, place, and time.     Coordination: Coordination normal.  Psychiatric:        Attention and Perception: Attention and perception normal. She does not perceive auditory or visual hallucinations.        Mood and Affect: Mood is anxious and depressed. Affect is not labile, blunt, angry or inappropriate.        Speech: Speech normal.        Behavior: Behavior is slowed. Behavior is cooperative.        Thought Content: Thought content normal. Thought content is not paranoid or delusional. Thought content does not include homicidal or suicidal ideation. Thought content does not include homicidal or suicidal plan.        Cognition and Memory: Cognition and memory normal.        Judgment: Judgment normal.     Comments: Insight intact     Lab Review:     Component Value Date/Time   NA 136 06/10/2020 1549   K 4.9 06/10/2020 1549   CL 101 06/10/2020 1549   CO2 23 06/10/2020 1549   GLUCOSE 92 06/10/2020 1549   BUN 31 (H)  06/10/2020 1549   CREATININE 1.57 (H) 06/10/2020 1549   CALCIUM 9.1 06/10/2020 1549   PROT 7.0 06/10/2020 1549   ALBUMIN 3.4 (L) 03/16/2019 2222   AST 29 06/10/2020 1549   ALT 19 06/10/2020 1549   ALKPHOS 53 03/16/2019 2222   BILITOT 0.2 06/10/2020 1549   GFRNONAA 33 (L) 06/10/2020 1549   GFRAA 38 (L) 06/10/2020 1549       Component Value Date/Time   WBC 8.7 06/10/2020 1549   RBC 5.04 06/10/2020 1549   HGB 14.8 06/10/2020 1549   HCT 45.1 (H) 06/10/2020 1549   PLT 257 06/10/2020 1549   MCV 89.5 06/10/2020 1549   MCH 29.4 06/10/2020 1549   MCHC 32.8 06/10/2020 1549   RDW 12.2 06/10/2020 1549   LYMPHSABS 1,940 06/10/2020 1549   MONOABS 0.6 11/16/2019 1331   EOSABS 122 06/10/2020 1549   BASOSABS 122 06/10/2020 1549    No results found for: POCLITH, LITHIUM  No results found for: PHENYTOIN, PHENOBARB, VALPROATE, CBMZ   .res Assessment: Plan:   Discussed that Saphris and past atypical antipsychotics may be causing tardive dyskinesia.  Recommend decreasing Saphris to improve involuntary movements.  Discussed decreasing Saphris to 2-1/2 mg sublingually at bedtime for 10 days, then discontinuing Saphris. Discussed potential benefits, risks, and side effects of increasing Lamictal.  Discussed that higher dose of Lamictal may be helpful for mood and anxiety signs and symptoms. May consider Buspar in the future for anxiety signs and symptoms since this would not have risk of exacerbating mood signs and symptoms. Patient to follow-up in 4 weeks or sooner if clinically indicated. Patient advised to contact office with any questions, adverse effects, or acute worsening in signs and symptoms.  Megan Brewer was seen today for medication problem.  Diagnoses and all orders for this visit:  Bipolar disorder, current episode mixed, moderate (HCC) -     Asenapine Maleate (SAPHRIS) 2.5 MG SUBL; Take 1 tab sublingual at bedtime for 10 days, then stop. -     Discontinue: lamoTRIgine (LAMICTAL) 200  MG tablet; Take 1 tablet (200 mg total) by mouth daily.  Insomnia due to other mental disorder  Anxiety disorder, unspecified type     Please see After Visit Summary for patient specific instructions.  Future Appointments  Date Time Provider [place]  06/24/2020  3:00 PM Wallene Huh, [place] [facility]  07/23/2020 11:00 AM Thayer Headings, PMHNP [facility] None  08/19/2020  1:30 PM [facility] None  09/27/2020 11:00 AM [name], NP [facility] None  11/15/2020  3:30 PM Narda Amber K, DO [facility] None  12/09/2020  3:15 PM [name], NP [facility] None    No orders of the defined types were placed in this encounter.   -------------------------------

## 2020-06-17 ENCOUNTER — Ambulatory Visit: Payer: Medicare PPO | Admitting: Psychiatry

## 2020-06-21 ENCOUNTER — Other Ambulatory Visit: Payer: Self-pay

## 2020-06-21 ENCOUNTER — Other Ambulatory Visit: Payer: Self-pay | Admitting: Psychiatry

## 2020-06-21 MED ORDER — DEXILANT 60 MG PO CPDR: 60.0000 mg | DELAYED_RELEASE_CAPSULE | Freq: Every day | ORAL | 5 refills | Status: DC

## 2020-06-24 ENCOUNTER — Ambulatory Visit: Payer: Medicare PPO | Admitting: Podiatry

## 2020-06-24 ENCOUNTER — Other Ambulatory Visit: Payer: Self-pay

## 2020-06-24 DIAGNOSIS — L309 Dermatitis, unspecified: Secondary | ICD-10-CM | POA: Diagnosis not present

## 2020-06-24 DIAGNOSIS — L84 Corns and callosities: Secondary | ICD-10-CM

## 2020-07-01 NOTE — Progress Notes (Signed)
Subjective:   Patient ID: Megan Brewer, female   DOB: 74 y.o.   MRN: [id number]   HPI Patient presents stating she has developed a callus formation plantar aspect right heel that is painful and at times makes it hard to walk comfortably.  States is been gradually getting worse and she has had a long-term history of this and does not smoke likes to be active   Review of Systems  All other systems reviewed and are negative.       Objective:  Physical Exam Vitals and nursing note reviewed.  Constitutional:      Appearance: She is well-developed.  Pulmonary:     Effort: Pulmonary effort is normal.  Musculoskeletal:        General: Normal range of motion.  Skin:    General: Skin is warm.  Neurological:     Mental Status: She is alert.     Neurovascular status was found to be intact muscle strength is adequate range of motion within normal limits.  Patient is found to have keratotic lesion of the right plantar heel that is painful when pressed and makes walking difficult and it is measuring around 1 cm x 1 cm with no current pinpoint bleeding noted.  Patient has good digital perfusion     Assessment:  Probability that were dealing with a plantar callus formation right     Plan:  H&P reviewed condition sterile sharp debridement accomplished of lesion no iatrogenic bleeding and reappoint for routine care

## 2020-07-17 ENCOUNTER — Encounter: Payer: Self-pay | Admitting: Family Medicine

## 2020-07-17 ENCOUNTER — Other Ambulatory Visit: Payer: Self-pay

## 2020-07-17 ENCOUNTER — Ambulatory Visit: Payer: Medicare PPO | Admitting: Family Medicine

## 2020-07-17 VITALS — BP 94/50 | HR 64 | Temp 97.1°F | Resp 12 | Ht 62.5 in | Wt 130.0 lb

## 2020-07-17 DIAGNOSIS — D803 Selective deficiency of immunoglobulin G [IgG] subclasses: Secondary | ICD-10-CM

## 2020-07-17 DIAGNOSIS — J310 Chronic rhinitis: Secondary | ICD-10-CM

## 2020-07-17 DIAGNOSIS — J0100 Acute maxillary sinusitis, unspecified: Secondary | ICD-10-CM | POA: Diagnosis not present

## 2020-07-17 MED ORDER — METHYLPREDNISOLONE ACETATE 40 MG/ML IJ SUSP
40.0000 mg | Freq: Once | INTRAMUSCULAR | Status: AC
  Administered 2020-07-17: 40 mg via INTRAMUSCULAR

## 2020-07-17 NOTE — Progress Notes (Addendum)
[address] [place] [address] Dept: [phone]  FOLLOW UP NOTE  Patient ID: Megan Brewer, female    DOB: 04-26-1946  Age: 74 y.o. MRN: [id number] Date of Office Visit: 07/17/2020  Assessment  Chief Complaint: Sinus Problem (sinus headaches x 10 days)  HPI Megan Brewer is a 74 year old female who presents to the clinic for evaluation of an acute sinus problem. She was last seen in this clinic on 05/24/2019 by Dr. Verlin Fester for evaluation of acute sinusitis, chronic rhinitis, and IgG deficiency. She is currently followed by Dr. Marcelline Deist with [facility]. At today's visit, she reports symptoms including sinus pain surrounding both eyes with the left side more painful than the right. She reports clear thin rhinorrhea, some intermittent nasal congestion, and thin post nasal drainage. She denies eye pain or vision changes. She reports that she is currently using azelastine as needed which is less than once a week. She is not currently using an antihistamine, nasal saline rinse or nasal steroid spray. She reports that she last needed a steroid at this time last year. She continues IVIG infusions under the prescription of Dr. Marcelline Deist. Her current medications are listed in the chart.    Drug Allergies:  Allergies  Allergen Reactions  . Tetracycline Nausea Only    Causes ringing in ears, dizziness, migraine, nausea  . Corticosteroids Other (See Comments)    12/02/2016 interview by Melissa Montane: Oral dosing causes migraines/nausea/tinnitus. Prev tolerated IV and intranasal admin w/o difficulty.  . Cefixime Other (See Comments)    Headache   . Ciprofloxacin Other (See Comments)    Headache, dizziness, ringing in the ears  . Doxycycline Other (See Comments)    unknown  . Gabapentin     Throat swells/closes  . Isometheptene-Dichloral-Apap Other (See Comments)    unknown  . Ketorolac     12/02/2016 interview by IWL: Oral dosing prednisone/corticosteroids  causes migraines/nausea/tinnitus. Prev tolerated IV and intranasal admin w/o difficulty.  . Prednisone Other (See Comments)    Migraine, dizzy, ringing in ears-can take it IV 12/02/2016 interview by [name]: Oral prednisone dosing causes migraines/nausea/tinnitus. Prev tolerated IV and intranasal admin w/o difficulty.  . Promethazine Other (See Comments)    causes severe abdominal pain when taken IV; however she can take PO or IM  . Stadol [Butorphanol]     Cerebral pain  . Erythromycin Base Diarrhea and Itching    Severe diarrhea  . Propoxyphene Nausea Only    Physical Exam: BP (!) 94/50   Pulse 64   Temp (!) 97.1 F (36.2 C) (Tympanic)   Resp 12   Ht 5' 2.5" (1.588 m)   Wt 130 lb (59 kg)   SpO2 99%   BMI 23.40 kg/m    Physical Exam Vitals reviewed.  Constitutional:      Appearance: Normal appearance.  HENT:     Head: Normocephalic and atraumatic.     Right Ear: Tympanic membrane normal.     Left Ear: Tympanic membrane normal.     Nose:     Comments: Bilateral nares slightly erythematous with clear nasal drainage noted. Slight septal deviation noted. Pharynx normal. Ears normal. Eyes normal.    Mouth/Throat:     Pharynx: Oropharynx is clear.  Eyes:     Conjunctiva/sclera: Conjunctivae normal.  Cardiovascular:     Rate and Rhythm: Normal rate and regular rhythm.     Heart sounds: Normal heart sounds. No murmur heard.   Pulmonary:  Effort: Pulmonary effort is normal.     Breath sounds: Normal breath sounds.     Comments: Lungs clear to auscultation Musculoskeletal:        General: Normal range of motion.     Cervical back: Normal range of motion and neck supple.  Skin:    General: Skin is warm and dry.  Neurological:     Mental Status: She is alert and oriented to person, place, and time.  Psychiatric:        Mood and Affect: Mood normal.        Behavior: Behavior normal.        Thought Content: Thought content normal.        Judgment: Judgment normal.      Assessment and Plan: 1. Acute maxillary sinusitis, recurrence not specified   2. Chronic rhinitis   3. 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 methylPREDNISolone acetate (DEPO-MEDROL) injection 40 mg    Patient Instructions  Acute sinusitis Depo-Medrol 40 mg was given in the clinic today Continue azelastine 2 sprays in each nostril twice a day as needed for a runny nose Begin Flonase 2 sprays in each nostril once a day as needed for a stuffy nose Begin saline nasal rinses as needed for nasal symptoms. Use this before any medicated nasal sprays for best result Call the clinic if your symptoms worsen, you develop a fever, or your symptoms do not improve  Chronic rhinitis Continue azelastine 2 sprays in each nostril twice a day as needed for a runny nose Begin Flonase 2 sprays in each nostril once a day as needed for a stuffy nose Begin saline nasal rinses as needed for nasal symptoms. Use this before any medicated nasal sprays for best result  IgG deficiency Continue infusions as prescribed by Dr. Marcelline Deist. Keep track of infections  Call the clinic if this treatment plan is not working well for you  Follow up in 2 months or sooner if needed.    Return in about 2 months (around 09/17/2020), or if symptoms worsen or fail to improve.    Thank you for the opportunity to care for this patient.  Please do not hesitate to contact me with questions.  Gareth Morgan, FNP [facility] [place]  ________________________________________________  I have provided oversight concerning Megan Brewer's evaluation and treatment of this patient's health issues addressed during today's encounter.  I agree with the assessment and therapeutic plan as outlined in the note.   Signed,   R Edgar Frisk, MD

## 2020-07-17 NOTE — Patient Instructions (Addendum)
Acute sinusitis Depo-Medrol 40 mg was given in the clinic today Continue azelastine 2 sprays in each nostril twice a day as needed for a runny nose Begin Flonase 2 sprays in each nostril once a day as needed for a stuffy nose Begin saline nasal rinses as needed for nasal symptoms. Use this before any medicated nasal sprays for best result Call the clinic if your symptoms worsen, you develop a fever, or your symptoms do not improve  Chronic rhinitis Continue azelastine 2 sprays in each nostril twice a day as needed for a runny nose Begin Flonase 2 sprays in each nostril once a day as needed for a stuffy nose Begin saline nasal rinses as needed for nasal symptoms. Use this before any medicated nasal sprays for best result  IgG deficiency Continue infusions as prescribed by Dr. Marcelline Deist. Keep track of infections  Call the clinic if this treatment plan is not working well for you  Follow up in 2 months or sooner if needed.

## 2020-07-23 ENCOUNTER — Encounter: Payer: Self-pay | Admitting: Psychiatry

## 2020-07-23 ENCOUNTER — Telehealth (INDEPENDENT_AMBULATORY_CARE_PROVIDER_SITE_OTHER): Payer: Medicare PPO | Admitting: Psychiatry

## 2020-07-23 ENCOUNTER — Telehealth: Payer: Self-pay | Admitting: Psychiatry

## 2020-07-23 ENCOUNTER — Other Ambulatory Visit: Payer: Self-pay | Admitting: Nurse Practitioner

## 2020-07-23 VITALS — BP 90/60 | Wt 130.0 lb

## 2020-07-23 DIAGNOSIS — F5105 Insomnia due to other mental disorder: Secondary | ICD-10-CM | POA: Diagnosis not present

## 2020-07-23 DIAGNOSIS — F419 Anxiety disorder, unspecified: Secondary | ICD-10-CM | POA: Diagnosis not present

## 2020-07-23 DIAGNOSIS — M79605 Pain in left leg: Secondary | ICD-10-CM

## 2020-07-23 DIAGNOSIS — F99 Mental disorder, not otherwise specified: Secondary | ICD-10-CM | POA: Diagnosis not present

## 2020-07-23 DIAGNOSIS — F3162 Bipolar disorder, current episode mixed, moderate: Secondary | ICD-10-CM

## 2020-07-23 DIAGNOSIS — M79604 Pain in right leg: Secondary | ICD-10-CM

## 2020-07-23 MED ORDER — LAMOTRIGINE 200 MG PO TABS: 200.0000 mg | ORAL_TABLET | Freq: Every day | ORAL | 2 refills | Status: DC

## 2020-07-23 MED ORDER — MIRTAZAPINE 15 MG PO TABS: 15.0000 mg | ORAL_TABLET | Freq: Every day | ORAL | 2 refills | Status: DC

## 2020-07-23 NOTE — Progress Notes (Signed)
Megan Brewer [id number] 1945/09/13 74 y.o.  Virtual Visit via Telephone Note  I connected with pt on 07/23/20 at 11:00 AM EST by telephone and verified that I am speaking with the correct person using two identifiers.   I discussed the limitations, risks, security and privacy concerns of performing an evaluation and management service by telephone and the availability of in person appointments. I also discussed with the patient that there may be a patient responsible charge related to this service. The patient expressed understanding and agreed to proceed.   I discussed the assessment and treatment plan with the patient. The patient was provided an opportunity to ask questions and all were answered. The patient agreed with the plan and demonstrated an understanding of the instructions.   The patient was advised to call back or seek an in-person evaluation if the symptoms worsen or if the condition fails to improve as anticipated.  I provided 30 minutes of non-face-to-face time during this encounter.  The patient was located at home.  The provider was located at [place].   Thayer Headings, PMHNP   Subjective:   Patient ID:  Megan Brewer is a 74 y.o. (DOB 1946-01-18) female.  Chief Complaint:  Chief Complaint  Patient presents with  . Anxiety  . Sleeping Problem  . Depression    HPI Megan Brewer presents for follow-up of anxiety, insomnia, and mood disturbance. She reports that her anxiety is "worse almost continuous and lasting several hours." She reports that she has been having panic attacks every couple of days. Denies any identifiable triggers to panic. Denies any acute psychosocial stressors. Denies excessive worry.   She reports that "shakes" may have improved slightly with decrease in S[place]ris, however she continues to have severe "shakes." She reports that when she tries to walk it feels like she is "on a teeter totter." She reports that her  hands and fingers shake and are worse with intentional movements. Denies any other shaking or involuntary movements to include orofacial movements. She reports that shaking is unrelated to anxiety.   Describes mood as "frustrated and I guess depressed." Denies mood lability. Denies any impulsive or risky behavior to include excessive spending. Denies irritability. She reports increased difficulty coping. "I've given up on trying to be happy." Denies being able to laugh or enjoy things. No change in energy- "don't go anywhere, don't do anything, don't see people." She reports that she has been socially withdrawn and not felt like communicating with others. Motivation is low. Appetite has been normal. She reports losing about 5 lbs. She reports poor concentration. Able to read without difficulty. "I just dread the future." Denies SI.   She reports that she starts to fall asleep about 6 pm and then awakens around 1 am and is awake for several hours before returning to sleep. She reports that sleep is fragmented with multiple awakenings.   Past Psychiatric Medication Trials: Sertraline- Was effective for about 25 years and no longer seems to be as effective.  Prozac Paxil  Cymbalta- may have caused psychosis. Depakote Trileptal- shaking and night sweats Gabapentin- Had throat swelling Lithium- Edema. Minimally effective Seroquel Risperidone Zyprexa- Effective for insomnia, anxiety, and mood. Excessive wt gain. Saphris Lamotrigine Melatonin Ativan- Reports taking until 2 months ago.  Valium- "great for my depression." Xanax Trazodone- Ineffective Requip- Took for years at 0.5 mg po qd.  Review of Systems:  Review of Systems  Musculoskeletal: Positive for gait problem.  Neurological: Positive for tremors. Negative for  dizziness and light-headedness.  Psychiatric/Behavioral:       Please refer to HPI  She reports that RLS has been well controlled   Medications: I have reviewed the  patient's current medications.  Current Outpatient Medications  Medication Sig Dispense Refill  . acetaminophen (TYLENOL) 500 MG tablet Take 1-2 tablets (500-1,000 mg total) by mouth every 8 (eight) hours as needed (Max dose 3000 mg in 24 hours).    Marland Kitchen azelastine (ASTELIN) 0.1 % nasal spray SMARTSIG:1 Spray(s) Both Nares 1 to 2 Times Daily PRN    . baclofen (LIORESAL) 10 MG tablet Take 10 mg by mouth every 28 (twenty-eight) days.    . Buprenorphine HCl-Naloxone HCl 4-1 MG FILM Place 1 strip under the tongue 2 (two) times daily.    . Calcium Citrate 200 MG TABS Take 1 tablet by mouth daily.     . cycloSPORINE (RESTASIS) 0.05 % ophthalmic emulsion Place 1 drop into both eyes 2 (two) times daily.    . D-MANNOSE PO Take by mouth daily.     Marland Kitchen dexlansoprazole (DEXILANT) 60 MG capsule Take 1 capsule (60 mg total) by mouth daily. Crack open the capsule and pour into spoon prior to ingestion 30 capsule 5  . Erenumab-aooe (AIMOVIG, 140 MG DOSE,) 70 MG/ML SOAJ Inject into the skin every 30 (thirty) days.    Marland Kitchen estradiol (ESTRACE) 0.1 MG/GM vaginal cream 3 (three) times a week.    . ferrous sulfate 325 (65 FE) MG tablet Take 325 mg by mouth 2 (two) times daily with a meal.    . immune globulin, human, (GAMMAGARD S/D LESS IGA) 10 g injection Inject into the vein every 21 ( twenty-one) days.     Marland Kitchen lamoTRIgine (LAMICTAL) 200 MG tablet Take 1 tablet (200 mg total) by mouth daily. 30 tablet 2  . levothyroxine (SYNTHROID) 75 MCG tablet TAKE ONE (1) TABLET BY MOUTH EACH DAY BEFORE BREAKFAST 90 tablet 0  . magnesium oxide (MAG-OX) 400 MG tablet Take 400 mg by mouth daily.     . potassium chloride SA (KLOR-CON) 20 MEQ tablet TAKE TWO TABLETS BY MOUTH DAILY (MAY DISSOLVE IN WATER) 180 tablet 1  . rOPINIRole (REQUIP) 1 MG tablet Take 1 tablet (1 mg total) by mouth at bedtime. 90 tablet 3  . torsemide (DEMADEX) 20 MG tablet TAKE 1 TABLET BY MOUTH DAILY 90 tablet 1  . Asenapine Maleate (SAPHRIS) 2.5 MG SUBL Take 1 tab  sublingual at bedtime for 10 days, then stop. 10 tablet 0  . LASTACAFT 0.25 % SOLN Apply 1 drop to eye daily.    . mirtazapine (REMERON) 15 MG tablet Take 1 tablet (15 mg total) by mouth at bedtime. 30 tablet 2  . tiZANidine (ZANAFLEX) 2 MG tablet Take 1 tablet (2 mg total) by mouth 3 (three) times daily as needed for muscle spasms. (Patient not taking: Reported on 07/17/2020) 30 tablet 1   No current facility-administered medications for this visit.    Medication Side Effects: None  Allergies:  Allergies  Allergen Reactions  . Tetracycline Nausea Only    Causes ringing in ears, dizziness, migraine, nausea  . Corticosteroids Other (See Comments)    12/02/2016 interview by Melissa Montane: Oral dosing causes migraines/nausea/tinnitus. Prev tolerated IV and intranasal admin w/o difficulty.  . Cefixime Other (See Comments)    Headache   . Ciprofloxacin Other (See Comments)    Headache, dizziness, ringing in the ears  . Doxycycline Other (See Comments)    unknown  . Gabapentin  Throat swells/closes  . Isometheptene-Dichloral-Apap Other (See Comments)    unknown  . Ketorolac     12/02/2016 interview by EXH: Oral dosing prednisone/corticosteroids causes migraines/nausea/tinnitus. Prev tolerated IV and intranasal admin w/o difficulty.  . Prednisone Other (See Comments)    Migraine, dizzy, ringing in ears-can take it IV 12/02/2016 interview by [name]: Oral prednisone dosing causes migraines/nausea/tinnitus. Prev tolerated IV and intranasal admin w/o difficulty.  . Promethazine Other (See Comments)    causes severe abdominal pain when taken IV; however she can take PO or IM  . Stadol [Butorphanol]     Cerebral pain  . Erythromycin Base Diarrhea and Itching    Severe diarrhea  . Propoxyphene Nausea Only    Past Medical History:  Diagnosis Date  . Allergy   . Anemia   . Arachnoiditis   . Bipolar disorder ([place])   . Blood transfusion without reported diagnosis   . Cataract    removed  .  Chronic back pain   . Chronic pain   . Chronic post-thoracotomy pain   . CKD (chronic kidney disease)   . GERD (gastroesophageal reflux disease)   . Hypogammaglobulinemia ([place])   . Hypotension   . Hypothyroid   . 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 Lung cancer ([place]) 2011, 2014  . Memory loss   . Migraine   . Opioid abuse ([place])   . Osteoporosis   . Presence of neurostimulator   . Recurrent upper respiratory infection (URI)   . Restless legs   . Sleep apnea   . Tremor   . Urge incontinence   . Urticaria    long time ago, nothing recent    Family History  Problem Relation Age of Onset  . Hypertension Mother   . GER disease Mother   . Dementia Mother   . Osteoporosis Mother   . Arthritis Mother   . Bipolar disorder Mother   . Parkinson's disease Father   . Congestive Heart Failure Father   . Pneumonia Father   . Arthritis Father   . Dementia Father   . Depression Father   . Asthma Sister   . Depression Sister   . Bipolar disorder Brother   . Asthma Daughter   . Colon cancer Neg Hx   . Esophageal cancer Neg Hx   . Stomach cancer Neg Hx   . Rectal cancer Neg Hx     Social History   Socioeconomic History  . Marital status: Married    Spouse name: Not on file  . Number of children: 2  . Years of education: Not on file  . Highest education level: GED or equivalent  Occupational History  . Occupation: retired  Tobacco Use  . Smoking status: Never Smoker  . Smokeless tobacco: Never Used  Vaping Use  . Vaping Use: Never used  Substance and Sexual Activity  . Alcohol use: Not Currently  . Drug use: Never  . Sexual activity: Not on file  Other Topics Concern  . Not on file  Social History Narrative   Diet: None      Caffeine: coffee, tea, sodas less than or 1 daily.      Married, if yes what year: Yes, 1972      Do you live in a house, apartment, assisted living, condo, trailer, ect: Two story house       Pets: None      Current/Past  profession: Teach      Exercise: None  Living Will: No   DNR: No   POA/HPOA: No      Functional Status:   Do you have difficulty bathing or dressing yourself? yes   Do you have difficulty preparing food or eating? yes   Do you have difficulty managing your medications? yes   Do you have difficulty managing your finances?yes   Do you have difficulty affording your medications? no   Social Determinants of Health   Financial Resource Strain: Not on file  Food Insecurity: Not on file  Transportation Needs: Not on file  Physical Activity: Not on file  Stress: Not on file  Social Connections: Not on file  Intimate Partner Violence: Not on file    Past Medical History, Surgical history, Social history, and Family history were reviewed and updated as appropriate.   Please see review of systems for further details on the patient's review from today.   Objective:   Physical Exam:  BP 90/60   Wt 130 lb (59 kg)   BMI 23.40 kg/m   Physical Exam Neurological:     Mental Status: She is alert and oriented to person, place, and time.     Cranial Nerves: No dysarthria.  Psychiatric:        Attention and Perception: Attention and perception normal.        Mood and Affect: Mood is anxious and depressed.        Speech: Speech normal.        Behavior: Behavior is cooperative.        Thought Content: Thought content normal. Thought content is not paranoid or delusional. Thought content does not include homicidal or suicidal ideation. Thought content does not include homicidal or suicidal plan.        Cognition and Memory: Cognition and memory normal.        Judgment: Judgment normal.     Comments: Insight intact     Lab Review:     Component Value Date/Time   NA 136 06/10/2020 1549   K 4.9 06/10/2020 1549   CL 101 06/10/2020 1549   CO2 23 06/10/2020 1549   GLUCOSE 92 06/10/2020 1549   BUN 31 (H) 06/10/2020 1549   CREATININE 1.57 (H) 06/10/2020 1549   CALCIUM 9.1  06/10/2020 1549   PROT 7.0 06/10/2020 1549   ALBUMIN 3.4 (L) 03/16/2019 2222   AST 29 06/10/2020 1549   ALT 19 06/10/2020 1549   ALKPHOS 53 03/16/2019 2222   BILITOT 0.2 06/10/2020 1549   GFRNONAA 33 (L) 06/10/2020 1549   GFRAA 38 (L) 06/10/2020 1549       Component Value Date/Time   WBC 8.7 06/10/2020 1549   RBC 5.04 06/10/2020 1549   HGB 14.8 06/10/2020 1549   HCT 45.1 (H) 06/10/2020 1549   PLT 257 06/10/2020 1549   MCV 89.5 06/10/2020 1549   MCH 29.4 06/10/2020 1549   MCHC 32.8 06/10/2020 1549   RDW 12.2 06/10/2020 1549   LYMPHSABS 1,940 06/10/2020 1549   MONOABS 0.6 11/16/2019 1331   EOSABS 122 06/10/2020 1549   BASOSABS 122 06/10/2020 1549    No results found for: POCLITH, LITHIUM   No results found for: PHENYTOIN, PHENOBARB, VALPROATE, CBMZ   .res Assessment: Plan:   Discussed potential benefits, risks, and side effects of Remeron 15 mg po QHS for mood, anxiety, and insomnia. Pt agrees to trial of Remeron. Will start Remeron 15 mg po QHS. Continue Lamictal 200 mg po qd for mood s/s.  Pt to follow-up in 6-8  weeks or sooner if clinically indicated.  Patient advised to contact office with any questions, adverse effects, or acute worsening in signs and symptoms.  Megan Brewer was seen today for anxiety, sleeping problem and depression.  Diagnoses and all orders for this visit:  Anxiety disorder, unspecified type -     mirtazapine (REMERON) 15 MG tablet; Take 1 tablet (15 mg total) by mouth at bedtime.  Bipolar disorder, current episode mixed, moderate (HCC) -     mirtazapine (REMERON) 15 MG tablet; Take 1 tablet (15 mg total) by mouth at bedtime. -     lamoTRIgine (LAMICTAL) 200 MG tablet; Take 1 tablet (200 mg total) by mouth daily.  Insomnia due to other mental disorder -     mirtazapine (REMERON) 15 MG tablet; Take 1 tablet (15 mg total) by mouth at bedtime.    Please see After Visit Summary for patient specific instructions.  Future Appointments  Date Time  Provider [place]  08/19/2020  1:30 PM [facility] None  09/16/2020  1:30 PM Thayer Headings, PMHNP [facility] None  09/27/2020 11:00 AM [name], NP [facility] None  11/15/2020  3:30 PM Narda Amber K, DO [facility] None  12/09/2020  3:15 PM [name], NP [facility] None    No orders of the defined types were placed in this encounter.     -------------------------------

## 2020-07-23 NOTE — Telephone Encounter (Signed)
Ms. marijah, larranaga are scheduled for a virtual visit with your provider today.    Just as we do with appointments in the office, we must obtain your consent to participate.  Your consent will be active for this visit and any virtual visit you may have with one of our providers in the next 365 days.    If you have a MyChart account, I can also send a copy of this consent to you electronically.  All virtual visits are billed to your insurance company just like a traditional visit in the office.  As this is a virtual visit, video technology does not allow for your provider to perform a traditional examination.  This may limit your provider's ability to fully assess your condition.  If your provider identifies any concerns that need to be evaluated in person or the need to arrange testing such as labs, EKG, etc, we will make arrangements to do so.    Although advances in technology are sophisticated, we cannot ensure that it will always work on either your end or our end.  If the connection with a video visit is poor, we may have to switch to a telephone visit.  With either a video or telephone visit, we are not always able to ensure that we have a secure connection.   I need to obtain your verbal consent now.   Are you willing to proceed with your visit today?   Malya Cirillo Gottschall has provided verbal consent on 07/23/2020 for a virtual visit (video or telephone).   Thayer Headings, PMHNP 07/23/2020  11:08 AM

## 2020-08-05 ENCOUNTER — Encounter: Payer: Self-pay | Admitting: Family

## 2020-08-05 ENCOUNTER — Other Ambulatory Visit: Payer: Self-pay | Admitting: Psychiatry

## 2020-08-05 ENCOUNTER — Other Ambulatory Visit: Payer: Self-pay

## 2020-08-05 ENCOUNTER — Ambulatory Visit (INDEPENDENT_AMBULATORY_CARE_PROVIDER_SITE_OTHER): Payer: Medicare PPO | Admitting: Family

## 2020-08-05 VITALS — BP 120/80 | HR 87 | Temp 97.1°F | Resp 16 | Ht 62.5 in | Wt 132.4 lb

## 2020-08-05 DIAGNOSIS — L299 Pruritus, unspecified: Secondary | ICD-10-CM | POA: Diagnosis not present

## 2020-08-05 MED ORDER — TRIAMCINOLONE ACETONIDE 0.1 % EX CREA: 1.0000 "application " | TOPICAL_CREAM | Freq: Two times a day (BID) | CUTANEOUS | 0 refills | Status: DC

## 2020-08-05 NOTE — Telephone Encounter (Signed)
Pt called and asked if you would please send in the low dose of sertraline to [facility]

## 2020-08-05 NOTE — Telephone Encounter (Signed)
Patient's next visit is 09/17/19. She was prescribed Mirtazapine and she can't take it. Her legs have collapsed on her twice while on it and she has fallen twice on carpet. Requesting something else to be called in at [facility]. [phone].

## 2020-08-05 NOTE — Telephone Encounter (Signed)
Left message for pt to stop Remeron due to weakness and falls. Recommended re-trial of low dose Sertraline since this has been effective for her in the past. Will send script to [facility]. Advised pt to call office with any questions.

## 2020-08-05 NOTE — Patient Instructions (Signed)
-   Notify provider if symptoms worsen or fail to improve  °

## 2020-08-05 NOTE — Progress Notes (Signed)
Provider: Jehan Bonano FNP-C  [name], NP  Patient Care Team: [name], NP as PCP - General (Geriatric Medicine) Alda Berthold, DO as Consulting Physician (Neurology)  Extended Emergency Contact Information Primary Emergency Contact: [facility] Address: [address] Phone: [phone] Mobile Phone: [phone] Relation: Spouse  Code Status:  Full Code  Goals of care: Advanced Directive information Advanced Directives 08/05/2020  Does Patient Have a Medical Advance Directive? No  Type of Advance Directive -  Does patient want to make changes to medical advance directive? -  Copy of [place] in Chart? -  Would patient like information on creating a medical advance directive? No - Patient declined     Chief Complaint  Patient presents with  . Acute Visit    Left leg itching.  . Concern    Moderate Fall Risk    HPI:  Pt is a 74 y.o. female seen today for an acute visit for evaluation of left leg itching x 3 days.States has had similar symptoms in the past and was prescribed triamcinolone cream which was effective.She request refill for triamcinolone cream.she denies any rash,swelling or redness on left leg.  Also states would like to notify her PCP [name] that she was seen by her Psychologist Thayer Headings and was diagnosed with T[place]  dyskinesia one month ago.Just want PCP to know.Reviewed chart not Behavioral Health NP on Epic.PCP able to review notes.patient notified.     Past Medical History:  Diagnosis Date  . Allergy   . Anemia   . Arachnoiditis   . Bipolar disorder ([place])   . Blood transfusion without reported diagnosis   . Cataract    removed  . Chronic back pain   . Chronic pain   . Chronic post-thoracotomy pain   . CKD (chronic kidney disease)   . GERD (gastroesophageal reflux disease)   . Hypogammaglobulinemia ([place])   . Hypotension   .  Hypothyroid   . 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 Lung cancer ([place]) 2011, 2014  . Memory loss   . Migraine   . Opioid abuse ([place])   . Osteoporosis   . Presence of neurostimulator   . Recurrent upper respiratory infection (URI)   . Restless legs   . Sleep apnea   . Tremor   . Urge incontinence   . Urticaria    long time ago, nothing recent   Past Surgical History:  Procedure Laterality Date  . ABDOMINAL HYSTERECTOMY    . CARPAL TUNNEL RELEASE    . CATARACT EXTRACTION Bilateral 2019  . CHOLECYSTECTOMY    . COLONOSCOPY  2015   [facility]  . CT LUNG SCREENING  2018  . DG  BONE DENSITY ([place] HX)  2018  . DIAGNOSTIC MAMMOGRAM  2019  . GASTRIC BYPASS    . LUNG CANCER SURGERY  02/2010  . MULTIPLE TOOTH EXTRACTIONS    . SPINAL CORD STIMULATOR IMPLANT      Allergies  Allergen Reactions  . Tetracycline Nausea Only    Causes ringing in ears, dizziness, migraine, nausea  . Corticosteroids Other (See Comments)    12/02/2016 interview by Melissa Montane: Oral dosing causes migraines/nausea/tinnitus. Prev tolerated IV and intranasal admin w/o difficulty.  . Cefixime Other (See Comments)    Headache   . Ciprofloxacin Other (See Comments)    Headache, dizziness, ringing in the ears  . Doxycycline Other (See  Comments)    unknown  . Gabapentin     Throat swells/closes  . Isometheptene-Dichloral-Apap Other (See Comments)    unknown  . Ketorolac     12/02/2016 interview by IWO: Oral dosing prednisone/corticosteroids causes migraines/nausea/tinnitus. Prev tolerated IV and intranasal admin w/o difficulty.  . Prednisone Other (See Comments)    Migraine, dizzy, ringing in ears-can take it IV 12/02/2016 interview by [name]: Oral prednisone dosing causes migraines/nausea/tinnitus. Prev tolerated IV and intranasal admin w/o difficulty.  . Promethazine Other (See Comments)    causes severe abdominal pain when taken IV; however she can take PO or IM  . Stadol [Butorphanol]     Cerebral pain  .  Erythromycin Base Diarrhea and Itching    Severe diarrhea  . Propoxyphene Nausea Only    Outpatient Encounter Medications as of 08/05/2020  Medication Sig  . acetaminophen (TYLENOL) 500 MG tablet Take 1-2 tablets (500-1,000 mg total) by mouth every 8 (eight) hours as needed (Max dose 3000 mg in 24 hours).  Marland Kitchen azelastine (ASTELIN) 0.1 % nasal spray SMARTSIG:1 Spray(s) Both Nares 1 to 2 Times Daily PRN  . baclofen (LIORESAL) 10 MG tablet Take 10 mg by mouth every 28 (twenty-eight) days.  . Buprenorphine HCl-Naloxone HCl 4-1 MG FILM Place 1 strip under the tongue 2 (two) times daily.  . Calcium Citrate 200 MG TABS Take 1 tablet by mouth daily.   . cycloSPORINE (RESTASIS) 0.05 % ophthalmic emulsion Place 1 drop into both eyes 2 (two) times daily.  . D-MANNOSE PO Take by mouth daily.   Marland Kitchen dexlansoprazole (DEXILANT) 60 MG capsule Take 1 capsule (60 mg total) by mouth daily. Crack open the capsule and pour into spoon prior to ingestion  . Erenumab-aooe (AIMOVIG, 140 MG DOSE,) 70 MG/ML SOAJ Inject into the skin every 30 (thirty) days.  Marland Kitchen estradiol (ESTRACE) 0.1 MG/GM vaginal cream 3 (three) times a week.  . ferrous sulfate 325 (65 FE) MG tablet Take 325 mg by mouth 2 (two) times daily with a meal.  . immune globulin, human, (GAMMAGARD S/D LESS IGA) 10 g injection Inject into the vein every 21 ( twenty-one) days.   Marland Kitchen lamoTRIgine (LAMICTAL) 200 MG tablet Take 1 tablet (200 mg total) by mouth daily.  Marland Kitchen LASTACAFT 0.25 % SOLN Apply 1 drop to eye daily.  Marland Kitchen levothyroxine (SYNTHROID) 75 MCG tablet TAKE ONE (1) TABLET BY MOUTH EACH DAY BEFORE BREAKFAST  . magnesium oxide (MAG-OX) 400 MG tablet Take 400 mg by mouth daily.   . potassium chloride SA (KLOR-CON) 20 MEQ tablet TAKE TWO TABLETS BY MOUTH DAILY (MAY DISSOLVE IN WATER)  . rOPINIRole (REQUIP) 1 MG tablet Take 1 tablet (1 mg total) by mouth at bedtime.  Marland Kitchen tiZANidine (ZANAFLEX) 2 MG tablet Take 1 tablet (2 mg total) by mouth 3 (three) times daily as needed  for muscle spasms.  Marland Kitchen torsemide (DEMADEX) 20 MG tablet TAKE 1 TABLET BY MOUTH DAILY  . Asenapine Maleate (SAPHRIS) 2.5 MG SUBL Take 1 tab sublingual at bedtime for 10 days, then stop.  . [DISCONTINUED] mirtazapine (REMERON) 15 MG tablet Take 1 tablet (15 mg total) by mouth at bedtime.   No facility-administered encounter medications on file as of 08/05/2020.    Review of Systems  Constitutional: Negative for appetite change, chills, fatigue and fever.  Respiratory: Negative for cough, chest tightness, shortness of breath and wheezing.   Cardiovascular: Positive for leg swelling. Negative for chest pain and palpitations.  Gastrointestinal: Negative for abdominal distention, abdominal pain, diarrhea, nausea  and vomiting.  Skin: Negative for color change, pallor and rash.    Immunization History  Administered Date(s) Administered  . Fluad Quad(high Dose 65+) 06/06/2019, 05/13/2020  . Influenza, High Dose Seasonal PF 06/06/2019  . Influenza, Seasonal, Injecte, Preservative Fre 11/14/2012  . Influenza,inj,Quad PF,6+ Mos 05/24/2018  . Influenza-Unspecified 11/14/2012, 04/17/2016, 06/09/2017, 05/24/2018  . PFIZER SARS-COV-2 Vaccination 09/07/2019, 09/28/2019, 03/17/2020  . Pneumococcal Conjugate-13 07/29/2017  . Pneumococcal Polysaccharide-23 07/29/2017, 06/10/2020  . Td 05/30/2020  . Tdap 05/30/2017  . Zoster Recombinat (Shingrix) 06/17/2017   Pertinent  Health Maintenance Due  Topic Date Due  . MAMMOGRAM  02/12/2021  . PNA vac Low Risk Adult (2 of 2 - PCV13) 06/10/2021  . COLONOSCOPY  01/01/2027  . INFLUENZA VACCINE  Completed  . DEXA SCAN  Completed   Fall Risk  08/05/2020 06/10/2020 03/25/2020 01/19/2020 12/08/2019  Falls in the past year? 1 0 0 0 1  Number falls in past yr: 1 0 0 0 0  Injury with Fall? 0 0 0 0 0   Functional Status Survey:    Vitals:   08/05/20 1418  BP: 120/80  Pulse: 87  Resp: 16  Temp: (!) 97.1 F (36.2 C)  SpO2: 96%  Weight: 132 lb 6.4 oz (60.1 kg)   Height: 5' 2.5" (1.588 m)   Body mass index is 23.83 kg/m. Physical Exam Vitals reviewed.  Constitutional:      General: She is not in acute distress.    Appearance: She is not ill-appearing.  HENT:     Head: Normocephalic.  Eyes:     General: No scleral icterus.       Right eye: No discharge.        Left eye: No discharge.     Conjunctiva/sclera: Conjunctivae normal.     Pupils: Pupils are equal, round, and reactive to light.  Cardiovascular:     Rate and Rhythm: Normal rate and regular rhythm.     Pulses: Normal pulses.  Pulmonary:     Effort: Pulmonary effort is normal. No respiratory distress.     Breath sounds: Normal breath sounds. No wheezing, rhonchi or rales.  Chest:     Chest wall: No tenderness.  Musculoskeletal:        General: No swelling or tenderness. Normal range of motion.     Cervical back: Normal range of motion. No rigidity or tenderness.     Right lower leg: Edema present.     Left lower leg: Edema present.     Comments: Chronic bilateral edema knee high compression stockings in place.   Lymphadenopathy:     Cervical: No cervical adenopathy.  Skin:    General: Skin is warm and dry.     Coloration: Skin is not pale.     Findings: No bruising, erythema or rash.     Comments: Left lateral leg without any redness or rash.   Neurological:     Mental Status: She is alert.  Psychiatric:        Mood and Affect: Mood normal.        Speech: Speech normal.        Behavior: Behavior normal.        Thought Content: Thought content normal.     Labs reviewed: Recent Labs    12/08/19 1541 06/10/20 1549  NA 139 136  K 5.1 4.9  CL 108 101  CO2 22 23  GLUCOSE 91 92  BUN 28* 31*  CREATININE 1.21* 1.57*  CALCIUM 8.9 9.1  Recent Labs    06/10/20 1549  AST 29  ALT 19  BILITOT 0.2  PROT 7.0   Recent Labs    11/16/19 1331 06/10/20 1549  WBC 7.5 8.7  NEUTROABS 5.1 6,012  HGB 12.2 14.8  HCT 36.9 45.1*  MCV 91.9 89.5  PLT 236.0 257   Lab  Results  Component Value Date   TSH 5.05 (H) 06/10/2020   No results found for: HGBA1C Lab Results  Component Value Date   CHOL  01/27/2010    144        ATP III CLASSIFICATION:  <200     mg/dL   Desirable  200-239  mg/dL   Borderline High  >=240    mg/dL   High          HDL 58 01/27/2010   LDLCALC  01/27/2010    72        Total Cholesterol/HDL:CHD Risk Coronary Heart Disease Risk Table                     Men   Women  1/2 Average Risk   3.4   3.3  Average Risk       5.0   4.4  2 X Average Risk   9.6   7.1  3 X Average Risk  23.4   11.0        Use the calculated Patient Ratio above and the CHD Risk Table to determine the patient's CHD Risk.        ATP III CLASSIFICATION (LDL):  <100     mg/dL   Optimal  100-129  mg/dL   Near or Above                    Optimal  130-159  mg/dL   Borderline  160-189  mg/dL   High  >190     mg/dL   Very High   TRIG 68 01/27/2010   CHOLHDL 2.5 01/27/2010    Significant Diagnostic Results in last 30 days:  No results found.  Assessment/Plan   Itching Afebrile.Itching on left lateral leg.Negative exam findings.Triamcinolone cream has been effective in the past. - Advised to apply thin layer triamcinolone cream twice daily x 7 days.Side effects discussed.   - Notify provider if symptoms worsen or fail to improve.  - triamcinolone (KENALOG) 0.1 %; Apply 1 application topically 2 (two) times daily.  Dispense: 30 g; Refill: 0  Family/ staff Communication: Reviewed plan of care with patient  Labs/tests ordered: None   Next Appointment: As needed if symptoms worsen or fail to improve.   Sandrea Hughs, NP

## 2020-08-06 NOTE — Telephone Encounter (Signed)
Lametria called to say that the pharmacy, [facility], does not have the prescription for her sertraline.  She took the last one yesterday.  Please send in the refill.

## 2020-08-06 NOTE — Telephone Encounter (Signed)
Error

## 2020-08-07 ENCOUNTER — Telehealth: Payer: Self-pay | Admitting: *Deleted

## 2020-08-07 MED ORDER — SUCRALFATE 1 GM/10ML PO SUSP: ORAL | 1 refills | Status: DC

## 2020-08-07 MED ORDER — SERTRALINE HCL 50 MG PO TABS: ORAL_TABLET | ORAL | 1 refills | Status: DC

## 2020-08-07 NOTE — Telephone Encounter (Signed)
Ngetich, Dinah C, NP  You 4 minutes ago (4:51 PM)     Okay to add to medication list.   Routing commen          Medication list updated and Rx sent to pharmacy.

## 2020-08-07 NOTE — Telephone Encounter (Signed)
Patient called wanting a refill on her Carafate 1gm/82ml Take 2 tsp by mouth three times daily as needed.   Medication is not in her Current medication list. Is this ok to add and refill.   Please Advise.   Pharmacy: [facility]

## 2020-08-07 NOTE — Telephone Encounter (Signed)
Patient stated that her previous doctor prescribed medication and she just haven't needed a refill on it till now.   Stated that she has been taking it three times daily as needed.

## 2020-08-07 NOTE — Telephone Encounter (Signed)
Ngetich, Dinah C, NP  You 6 minutes ago (4:35 PM)     When was the last time you used Carafate? Do you recall which provider prescribed medication ?Medication is not on your current medication list.

## 2020-08-19 ENCOUNTER — Ambulatory Visit: Payer: Medicare PPO

## 2020-08-20 ENCOUNTER — Ambulatory Visit: Payer: Medicare PPO

## 2020-08-21 ENCOUNTER — Other Ambulatory Visit: Payer: Self-pay | Admitting: *Deleted

## 2020-08-21 DIAGNOSIS — E039 Hypothyroidism, unspecified: Secondary | ICD-10-CM

## 2020-08-21 MED ORDER — LEVOTHYROXINE SODIUM 75 MCG PO TABS: ORAL_TABLET | ORAL | 1 refills | Status: DC

## 2020-08-21 NOTE — Telephone Encounter (Signed)
Pharmacy requested refill

## 2020-08-28 ENCOUNTER — Emergency Department (HOSPITAL_BASED_OUTPATIENT_CLINIC_OR_DEPARTMENT_OTHER)
Admission: EM | Admit: 2020-08-28 | Discharge: 2020-08-28 | Disposition: A | Discharge status: Home / Self Care | Payer: Medicare PPO | Attending: Emergency Medicine | Admitting: Emergency Medicine

## 2020-08-28 ENCOUNTER — Other Ambulatory Visit: Payer: Self-pay

## 2020-08-28 ENCOUNTER — Encounter (HOSPITAL_BASED_OUTPATIENT_CLINIC_OR_DEPARTMENT_OTHER): Payer: Self-pay | Admitting: *Deleted

## 2020-08-28 DIAGNOSIS — R600 Localized edema: Secondary | ICD-10-CM | POA: Insufficient documentation

## 2020-08-28 DIAGNOSIS — R5383 Other fatigue: Secondary | ICD-10-CM | POA: Insufficient documentation

## 2020-08-28 DIAGNOSIS — Z79899 Other long term (current) drug therapy: Secondary | ICD-10-CM | POA: Insufficient documentation

## 2020-08-28 DIAGNOSIS — R531 Weakness: Secondary | ICD-10-CM | POA: Insufficient documentation

## 2020-08-28 DIAGNOSIS — N189 Chronic kidney disease, unspecified: Secondary | ICD-10-CM | POA: Insufficient documentation

## 2020-08-28 DIAGNOSIS — E860 Dehydration: Secondary | ICD-10-CM

## 2020-08-28 DIAGNOSIS — E876 Hypokalemia: Secondary | ICD-10-CM | POA: Diagnosis not present

## 2020-08-28 DIAGNOSIS — E039 Hypothyroidism, unspecified: Secondary | ICD-10-CM | POA: Diagnosis not present

## 2020-08-28 DIAGNOSIS — R197 Diarrhea, unspecified: Secondary | ICD-10-CM | POA: Diagnosis not present

## 2020-08-28 DIAGNOSIS — I129 Hypertensive chronic kidney disease with stage 1 through stage 4 chronic kidney disease, or unspecified chronic kidney disease: Secondary | ICD-10-CM | POA: Diagnosis not present

## 2020-08-28 LAB — COMPREHENSIVE METABOLIC PANEL
ALT: 25 U/L (ref 0–44)
AST: 41 U/L (ref 15–41)
Albumin: 4.2 g/dL (ref 3.5–5.0)
Alkaline Phosphatase: 45 U/L (ref 38–126)
Anion gap: 13 (ref 5–15)
BUN: 13 mg/dL (ref 8–23)
CO2: 29 mmol/L (ref 22–32)
Calcium: 9.4 mg/dL (ref 8.9–10.3)
Chloride: 92 mmol/L — ABNORMAL LOW (ref 98–111)
Creatinine, Ser: 0.89 mg/dL (ref 0.44–1.00)
GFR, Estimated: >60 mL/min (ref >60)
Glucose, Bld: 119 mg/dL — ABNORMAL HIGH (ref 70–99)
Potassium: 2.7 mmol/L — CL (ref 3.5–5.1)
Sodium: 134 mmol/L — ABNORMAL LOW (ref 135–145)
Total Bilirubin: 0.5 mg/dL (ref 0.3–1.2)
Total Protein: 7.5 g/dL (ref 6.5–8.1)

## 2020-08-28 LAB — URINALYSIS, MICROSCOPIC (REFLEX)

## 2020-08-28 LAB — CBC WITH DIFFERENTIAL/PLATELET
Abs Immature Granulocytes: 0.04 10*3/uL (ref 0.00–0.07)
Basophils Absolute: 0.1 10*3/uL (ref 0.0–0.1)
Basophils Relative: 1 %
Eosinophils Absolute: 0 10*3/uL (ref 0.0–0.5)
Eosinophils Relative: 0 %
HCT: 44.4 % (ref 36.0–46.0)
Hemoglobin: 15.5 g/dL — ABNORMAL HIGH (ref 12.0–15.0)
Immature Granulocytes: 1 %
Lymphocytes Relative: 12 %
Lymphs Abs: 1 10*3/uL (ref 0.7–4.0)
MCH: 30.8 pg (ref 26.0–34.0)
MCHC: 34.9 g/dL (ref 30.0–36.0)
MCV: 88.1 fL (ref 80.0–100.0)
Monocytes Absolute: 0.7 10*3/uL (ref 0.1–1.0)
Monocytes Relative: 8 %
Neutro Abs: 6.5 10*3/uL (ref 1.7–7.7)
Neutrophils Relative %: 78 %
Platelets: 240 10*3/uL (ref 150–400)
RBC: 5.04 MIL/uL (ref 3.87–5.11)
RDW: 12.1 % (ref 11.5–15.5)
WBC: 8.4 10*3/uL (ref 4.0–10.5)
nRBC: 0 % (ref 0.0–0.2)

## 2020-08-28 LAB — URINALYSIS, ROUTINE W REFLEX MICROSCOPIC
Bilirubin Urine: NEGATIVE
Glucose, UA: NEGATIVE mg/dL
Ketones, ur: NEGATIVE mg/dL
Leukocytes,Ua: NEGATIVE
Nitrite: NEGATIVE
Protein, ur: NEGATIVE mg/dL
Specific Gravity, Urine: 1.01 (ref 1.005–1.030)
pH: 6 (ref 5.0–8.0)

## 2020-08-28 LAB — C DIFFICILE QUICK SCREEN W PCR REFLEX
C Diff antigen: NEGATIVE
C Diff interpretation: NOT DETECTED
C Diff toxin: NEGATIVE

## 2020-08-28 LAB — MAGNESIUM: Magnesium: 1.3 mg/dL — ABNORMAL LOW (ref 1.7–2.4)

## 2020-08-28 LAB — LIPASE, BLOOD: Lipase: 46 U/L (ref 11–51)

## 2020-08-28 MED ORDER — ACETAMINOPHEN 325 MG PO TABS
650.0000 mg | ORAL_TABLET | Freq: Once | ORAL | Status: AC
  Administered 2020-08-28: 650 mg via ORAL
  Filled 2020-08-28: qty 2

## 2020-08-28 MED ORDER — POTASSIUM CHLORIDE CRYS ER 20 MEQ PO TBCR
40.0000 meq | EXTENDED_RELEASE_TABLET | Freq: Once | ORAL | Status: AC
  Administered 2020-08-28: 40 meq via ORAL
  Filled 2020-08-28: qty 2

## 2020-08-28 MED ORDER — HEPARIN SOD (PORK) LOCK FLUSH 100 UNIT/ML IV SOLN
INTRAVENOUS | Status: AC
  Administered 2020-08-28: 500 [IU]
  Filled 2020-08-28: qty 5

## 2020-08-28 MED ORDER — LACTATED RINGERS IV BOLUS
500.0000 mL | Freq: Once | INTRAVENOUS | Status: AC
  Administered 2020-08-28: 500 mL via INTRAVENOUS

## 2020-08-28 MED ORDER — MAGNESIUM SULFATE 2 GM/50ML IV SOLN
2.0000 g | Freq: Once | INTRAVENOUS | Status: AC
  Administered 2020-08-28: 2 g via INTRAVENOUS
  Filled 2020-08-28: qty 50

## 2020-08-28 MED ORDER — HEPARIN SOD (PORK) LOCK FLUSH 100 UNIT/ML IV SOLN: 500.0000 [IU] | Freq: Once | INTRAVENOUS | Status: AC

## 2020-08-28 NOTE — ED Triage Notes (Signed)
Pt c/o diarrhea   x 1 week

## 2020-08-28 NOTE — ED Notes (Signed)
PA aware of potassium level of 2.7

## 2020-08-28 NOTE — ED Provider Notes (Signed)
[facility] EMERGENCY DEPARTMENT Provider Note   CSN: [id number] Arrival date & time: 08/28/20  1328     History Chief Complaint  Patient presents with  . Diarrhea    Megan Brewer is a 75 y.o. female.  The history is provided by the patient and medical records.  Diarrhea  Megan Brewer is a 75 y.o. female who presents to the Emergency Department complaining of diarrhea.  She presents to the ED accompanied by her husband for one and a half weeks of diarrhea.  She has a hx/o cdiff and was started on flagyl.  Her diarrhea is no longer explosive but she continues to have 4-6 episodes of diarrhea daily.  She has associated fatigue, generalized weakness. She denies any fevers, chest pain, shortness of breath. She does have chills. Symptoms are severe, constant, worsening. She denies any associated fevers, abdominal pain. She has a history of IgG deficiency.     Past Medical History:  Diagnosis Date  . Allergy   . Anemia   . Arachnoiditis   . Bipolar disorder ([place])   . Blood transfusion without reported diagnosis   . Cataract    removed  . Chronic back pain   . Chronic pain   . Chronic post-thoracotomy pain   . CKD (chronic kidney disease)   . GERD (gastroesophageal reflux disease)   . Hypogammaglobulinemia ([place])   . Hypotension   . Hypothyroid   . 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 Lung cancer ([place]) 2011, 2014  . Memory loss   . Migraine   . Opioid abuse ([place])   . Osteoporosis   . Presence of neurostimulator   . Recurrent upper respiratory infection (URI)   . Restless legs   . Sleep apnea   . Tremor   . Urge incontinence   . Urticaria    long time ago, nothing recent    Patient Active Problem List   Diagnosis Date Noted  . Chest discomfort 03/22/2019  . Weight loss 01/27/2019  . Acquired hypothyroidism 01/27/2019  . Esophagitis 01/27/2019  . Intractable migraine without status migrainosus 01/27/2019  . Osteoporosis 01/27/2019  .  Chronic sinusitis 12/28/2018  . Food intolerance/GI symptoms 12/28/2018  . Acute maxillary sinusitis 12/28/2018  . Lower leg edema 12/20/2018  . Chronic pain of both shoulders 05/24/2018  . Bilateral leg edema 05/24/2018  . Chronic pain syndrome 03/31/2018  . Left bundle branch block 10/28/2017  . Palpitations 10/28/2017  . History of gastric bypass 05/21/2017  . Polypharmacy 02/11/2017  . Senile nuclear sclerosis 11/26/2016  . Chronic post-thoracotomy pain 04/09/2014  . Chronic kidney disease 04/09/2014  . Sleep disturbances 09/20/2012  . Restless legs syndrome 07/07/2012  . Hypertonicity of bladder 05/23/2012  . Essential tremor 04/01/2012  . Memory loss 04/01/2012  . Incomplete emptying of bladder 01/14/2012  . Urge incontinence 10/28/2011  . Tension type headache 06/10/2011  . Mixed urge and stress incontinence 05/28/2011  . Hypogammaglobulinemia ([place]) 06/22/2010  . Severe episode of recurrent major depressive disorder, without psychotic features ([place]) 06/22/2010  . Hypothyroidism 02/12/2010  . 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[place]) 01/31/2010  . ARACHNOIDITIS 01/31/2010  . OBSTRUCTIVE SLEEP APNEA 01/31/2010  . Chronic rhinitis 01/31/2010  . Arachnoiditis 01/31/2010    Past Surgical History:  Procedure Laterality Date  . ABDOMINAL HYSTERECTOMY    . CARPAL TUNNEL RELEASE    . CATARACT EXTRACTION Bilateral 2019  . CHOLECYSTECTOMY    . COLONOSCOPY  2015   [facility]  . CT LUNG SCREENING  2018  . DG  BONE DENSITY ([place] [facility]X)  2018  . DIAGNOSTIC MAMMOGRAM  2019  . GASTRIC BYPASS    . LUNG CANCER SURGERY  02/2010  . MULTIPLE TOOTH EXTRACTIONS    . SPINAL CORD STIMULATOR IMPLANT       OB History   No obstetric history on file.     Family History  Problem Relation Age of Onset  . Hypertension Mother   . GER disease Mother   . Dementia Mother   . Osteoporosis Mother   . Arthritis Mother   . Bipolar disorder Mother   . Parkinson's disease Father   . Congestive Heart  Failure Father   . Pneumonia Father   . Arthritis Father   . Dementia Father   . Depression Father   . Asthma Sister   . Depression Sister   . Bipolar disorder Brother   . Asthma Daughter   . Colon cancer Neg Hx   . Esophageal cancer Neg Hx   . Stomach cancer Neg Hx   . Rectal cancer Neg Hx     Social History   Tobacco Use  . Smoking status: Never Smoker  . Smokeless tobacco: Never Used  Vaping Use  . Vaping Use: Never used  Substance Use Topics  . Alcohol use: Not Currently  . Drug use: Never    Home Medications Prior to Admission medications   Medication Sig Start Date End Date Taking? Authorizing Provider  acetaminophen (TYLENOL) 500 MG tablet Take 1-2 tablets (500-1,000 mg total) by mouth every 8 (eight) hours as needed (Max dose 3000 mg in 24 hours). 06/11/20   [name], NP  Asenapine Maleate (SAPHRIS) 2.5 MG SUBL Take 1 tab sublingual at bedtime for 10 days, then stop. 06/14/20 06/23/20  Thayer Headings, PMHNP  azelastine (ASTELIN) 0.1 % nasal spray SMARTSIG:1 Spray(s) Both Nares 1 to 2 Times Daily PRN 04/17/19   [provider]  baclofen (LIORESAL) 10 MG tablet Take 10 mg by mouth every 28 (twenty-eight) days. 06/03/20   [provider]  Buprenorphine HCl-Naloxone HCl 4-1 MG FILM Place 1 strip under the tongue 2 (two) times daily. 10/31/19   [provider]  Calcium Citrate 200 MG TABS Take 1 tablet by mouth daily.  12/02/16   [provider]  cycloSPORINE (RESTASIS) 0.05 % ophthalmic emulsion Place 1 drop into both eyes 2 (two) times daily.    [provider]  D-MANNOSE PO Take by mouth daily.     [provider]  dexlansoprazole (DEXILANT) 60 MG capsule Take 1 capsule (60 mg total) by mouth daily. Crack open the capsule and pour into spoon prior to ingestion 06/21/20   Armbruster, Carlota Raspberry, MD  Erenumab-aooe (AIMOVIG, 140 MG DOSE,) 70 MG/ML SOAJ Inject into the skin every 30 (thirty) days.    [provider]  estradiol (ESTRACE) 0.1 MG/GM vaginal cream 3 (three) times a week. 01/12/20   [provider]  ferrous sulfate 325 (65 FE) MG tablet Take 325 mg by mouth 2 (two) times daily with a meal.    [provider]  immune globulin, human, (GAMMAGARD S/D LESS IGA) 10 g injection Inject into the vein every 21 ( twenty-one) days.     [provider]  lamoTRIgine (LAMICTAL) 200 MG tablet Take 1 tablet (200 mg total) by mouth daily. 07/23/20   Thayer Headings, PMHNP  LASTACAFT 0.25 % SOLN Apply 1 drop to eye daily. 01/19/19   [provider]  levothyroxine (SYNTHROID)  75 MCG tablet Take one tablet by mouth once daily 30 minutes before breakfast on empty stomach. 08/21/20   [name], NP  magnesium oxide (MAG-OX) 400 MG tablet Take 400 mg by mouth daily.     [provider]  potassium chloride SA (KLOR-CON) 20 MEQ tablet TAKE TWO TABLETS BY MOUTH DAILY (MAY DISSOLVE IN WATER) 06/11/20   [name], NP  rOPINIRole (REQUIP) 1 MG tablet Take 1 tablet (1 mg total) by mouth at bedtime. 11/13/19   Narda Amber K, DO  sertraline (ZOLOFT) 50 MG tablet Take 1/2 tab po qd x 7 days, then 1 tab po qd 08/07/20   Thayer Headings, PMHNP  sucralfate (CARAFATE) 1 GM/10ML suspension Take 2 tsp by mouth three times daily as needed. 08/07/20   Ngetich, Dinah C, NP  tiZANidine (ZANAFLEX) 2 MG tablet Take 1 tablet (2 mg total) by mouth 3 (three) times daily as needed for muscle spasms. 02/29/20   [name], NP  torsemide (DEMADEX) 20 MG tablet TAKE 1 TABLET BY MOUTH DAILY 07/23/20   [name], NP  triamcinolone (KENALOG) 0.1 % Apply 1 application topically 2 (two) times daily. 08/05/20   Ngetich, Dinah C, NP    Allergies    Tetracycline, Corticosteroids, Cefixime, Ciprofloxacin, Doxycycline, Gabapentin, Isometheptene-dichloral-apap, Ketorolac, Prednisone, Promethazine, Stadol [butorphanol], Erythromycin base, and Propoxyphene  Review of Systems    Review of Systems  Gastrointestinal: Positive for diarrhea.  All other systems reviewed and are negative.   Physical Exam Updated Vital Signs BP 135/65   Pulse 86   Temp 97.9 F (36.6 C) (Oral)   Resp 16   Ht 5\' 3"  (1.6 m)   Wt 59 kg   SpO2 96%   BMI 23.03 kg/m   Physical Exam Vitals and nursing note reviewed.  Constitutional:      Appearance: She is well-developed and well-nourished.  HENT:     Head: Normocephalic and atraumatic.  Cardiovascular:     Rate and Rhythm: Normal rate and regular rhythm.     Heart sounds: No murmur heard.   Pulmonary:     Effort: Pulmonary effort is normal. No respiratory distress.     Breath sounds: Normal breath sounds.  Abdominal:     Palpations: Abdomen is soft.     Tenderness: There is no abdominal tenderness. There is no guarding or rebound.  Musculoskeletal:        General: No tenderness or edema.     Comments: 1-2 + pitting edema to BLE  Skin:    General: Skin is warm and dry.  Neurological:     Mental Status: She is alert and oriented to person, place, and time.  Psychiatric:        Mood and Affect: Mood and affect normal.        Behavior: Behavior normal.     ED Results / Procedures / Treatments   Labs (all labs ordered are listed, but only abnormal results are displayed) Labs Reviewed  URINALYSIS, ROUTINE W REFLEX MICROSCOPIC - Abnormal; Notable for the following components:      Result Value   Hgb urine dipstick SMALL (*)    All other components within normal limits  CBC WITH DIFFERENTIAL/PLATELET - Abnormal; Notable for the following components:   Hemoglobin 15.5 (*)    All other components within normal limits  COMPREHENSIVE METABOLIC PANEL - Abnormal; Notable for the following components:   Sodium 134 (*)    Potassium 2.7 (*)    Chloride 92 (*)  Glucose, Bld 119 (*)    All other components within normal limits  URINALYSIS, MICROSCOPIC (REFLEX) - Abnormal; Notable for the following components:    Bacteria, UA RARE (*)    All other components within normal limits  MAGNESIUM - Abnormal; Notable for the following components:   Magnesium 1.3 (*)    All other components within normal limits  C DIFFICILE QUICK SCREEN W PCR REFLEX  GASTROINTESTINAL PANEL BY PCR, STOOL (REPLACES STOOL CULTURE)  LIPASE, BLOOD    EKG EKG Interpretation  Date/Time:  Wednesday August 28 2020 16:35:00 EST Ventricular Rate:  89 PR Interval:    QRS Duration: 136 QT Interval:  424 QTC Calculation: 516 R Axis:   41 Text Interpretation: Sinus rhythm Left atrial enlargement IVCD, consider atypical LBBB Poor data quality Confirmed by Quintella Reichert [phone]) on 08/28/2020 4:53:46 PM   Radiology No results found.  Procedures Procedures (including critical care time)  Medications Ordered in ED Medications  potassium chloride SA (KLOR-CON) CR tablet 40 mEq (40 mEq Oral Given 08/28/20 1732)  lactated ringers bolus 500 mL (0 mLs Intravenous Stopped 08/28/20 1926)  magnesium sulfate IVPB 2 g 50 mL (0 g Intravenous Stopped 08/28/20 1834)  acetaminophen (TYLENOL) tablet 650 mg (650 mg Oral Given 08/28/20 1938)  heparin lock flush 100 unit/mL (500 Units Intracatheter Given 08/28/20 2041)    ED Course  I have reviewed the triage vital signs and the nursing notes.  Pertinent labs & imaging results that were available during my care of the patient were reviewed by me and considered in my medical decision making (see chart for details).    MDM Rules/Calculators/A&P                         Patient here for evaluation of fatigue, weakness, diarrhea. Overall her diarrhea is improving but persistent on Flagyl. She is non-toxic appearing on evaluation with no significant abdominal tenderness. Labs are significant for hypokalemia, hypomagnesemia. She was treated with gentle IV fluid hydration and electrolyte replacement. On repeat assessment she is feeling improved. Her C. diff study is negative today. She is tolerating  oral fluids in the emergency department. Presentation is not consistent with diverticulitis, serious bacterial infection. Discussed with patient him care for diarrhea. Discussed outpatient follow-up and return precautions.  Final Clinical Impression(s) / ED Diagnoses Final diagnoses:  Diarrhea, unspecified type  Dehydration  Hypokalemia  Hypomagnesemia    Rx / DC Orders ED Discharge Orders    None       Quintella Reichert, MD 08/28/20 2304

## 2020-08-29 LAB — GASTROINTESTINAL PANEL BY PCR, STOOL (REPLACES STOOL CULTURE)

## 2020-09-09 ENCOUNTER — Other Ambulatory Visit: Payer: Self-pay | Admitting: Neurology

## 2020-09-09 DIAGNOSIS — G2581 Restless legs syndrome: Secondary | ICD-10-CM

## 2020-09-16 ENCOUNTER — Encounter: Payer: Self-pay | Admitting: Psychiatry

## 2020-09-16 ENCOUNTER — Telehealth (INDEPENDENT_AMBULATORY_CARE_PROVIDER_SITE_OTHER): Payer: Medicare PPO | Admitting: Psychiatry

## 2020-09-16 DIAGNOSIS — F5105 Insomnia due to other mental disorder: Secondary | ICD-10-CM | POA: Diagnosis not present

## 2020-09-16 DIAGNOSIS — F419 Anxiety disorder, unspecified: Secondary | ICD-10-CM | POA: Diagnosis not present

## 2020-09-16 DIAGNOSIS — F3162 Bipolar disorder, current episode mixed, moderate: Secondary | ICD-10-CM | POA: Diagnosis not present

## 2020-09-16 DIAGNOSIS — F99 Mental disorder, not otherwise specified: Secondary | ICD-10-CM | POA: Diagnosis not present

## 2020-09-16 MED ORDER — MIRTAZAPINE 7.5 MG PO TABS: 7.5000 mg | ORAL_TABLET | Freq: Every day | ORAL | 2 refills | Status: DC

## 2020-09-16 MED ORDER — LAMOTRIGINE 200 MG PO TABS: 200.0000 mg | ORAL_TABLET | Freq: Every day | ORAL | 2 refills | Status: DC

## 2020-09-16 MED ORDER — SERTRALINE HCL 50 MG PO TABS
ORAL_TABLET | ORAL | 1 refills | Status: DC
Start: 2020-09-16 — End: 2020-09-27

## 2020-09-16 NOTE — Progress Notes (Signed)
Megan Brewer [id number] [birth year] 75 y.o.  Virtual Visit via Telephone Note  I connected with pt on 09/16/20 at  1:15 PM EST by telephone and verified that I am speaking with the correct person using two identifiers.   I discussed the limitations, risks, security and privacy concerns of performing an evaluation and management service by telephone and the availability of in person appointments. I also discussed with the patient that there may be a patient responsible charge related to this service. The patient expressed understanding and agreed to proceed.   I discussed the assessment and treatment plan with the patient. The patient was provided an opportunity to ask questions and all were answered. The patient agreed with the plan and demonstrated an understanding of the instructions.   The patient was advised to call back or seek an in-person evaluation if the symptoms worsen or if the condition fails to improve as anticipated.  I provided 30 minutes of non-face-to-face time during this encounter.  The patient was located at home.  The provider was located at [place].   Thayer Headings, PMHNP   Subjective:   Patient ID:  Megan Brewer is a 75 y.o. (DOB [birth year]) female.  Chief Complaint:  Chief Complaint  Patient presents with  . Depression  . Follow-up    H/O anxiety and insomnia    HPI Megan Brewer presents for follow-up of depression, anxiety, and insomnia. She reports that she has lost 20 lbs since the fall. She c/o "explosive diarrhea." She reports that her diarrhea has finally improved. She reports that her appetite has been improving and she was previously on clear liquids. Her energy has been very low and it is gradually improving. She reports that she has been having to use a walker.   Mood has been depressed. She reports that her anxiety has been "not as bad as it was." She reports occasional panic attacks but not as severe. She reports  that she has had some irritability. Sleep has been "about the same." She reports that she has difficulty focusing on reading, such as a book or the newspaper. Denies any mood lability- "I don't cry, I don't laugh." Denies SI.  She reports that Remeron was helpful for her mood, however her "legs gave out on it." She reports that Sertraline has not been as effective.   Past Psychiatric Medication Trials: Sertraline- Was effective for about 25 years and no longer seems to be as effective.  Prozac Paxil  Cymbalta- may have caused psychosis. Remeron-Helpful for her mood and caused her legs to give out.  Depakote Trileptal- shaking and night sweats Gabapentin- Had throat swelling Lithium- Edema. Minimally effective Seroquel Risperidone Zyprexa- Effective for insomnia, anxiety, and mood. Excessive wt gain. Saphris Lamotrigine Melatonin Ativan- Reports taking until 2 months ago.  Valium- "great for my depression." Xanax Trazodone- Ineffective Requip- Took for years at 0.5 mg po qd.  Review of Systems:  Review of Systems  Gastrointestinal: Negative for diarrhea.  Genitourinary:       Has had recurrent UTI's  Musculoskeletal: Positive for gait problem.  Neurological: Positive for tremors.  Psychiatric/Behavioral:       Please refer to HPI    She reports that RLS is improved with Ropinirole. She notices some shaking in her hands and feet.   Medications: I have reviewed the patient's current medications.  Current Outpatient Medications  Medication Sig Dispense Refill  . acetaminophen (TYLENOL) 500 MG tablet Take 1-2 tablets (500-1,000 mg total) by mouth  every 8 (eight) hours as needed (Max dose 3000 mg in 24 hours).    Marland Kitchen azelastine (ASTELIN) 0.1 % nasal spray SMARTSIG:1 Spray(s) Both Nares 1 to 2 Times Daily PRN    . baclofen (LIORESAL) 10 MG tablet Take 10 mg by mouth every 28 (twenty-eight) days.    Marland Kitchen dexlansoprazole (DEXILANT) 60 MG capsule Take 1 capsule (60 mg total) by mouth  daily. Crack open the capsule and pour into spoon prior to ingestion 30 capsule 5  . Erenumab-aooe (AIMOVIG, 140 MG DOSE,) 70 MG/ML SOAJ Inject into the skin every 30 (thirty) days.    Marland Kitchen estradiol (ESTRACE) 0.1 MG/GM vaginal cream 3 (three) times a week.    . ferrous sulfate 325 (65 FE) MG tablet Take 325 mg by mouth 2 (two) times daily with a meal.    . ibuprofen (ADVIL) 200 MG tablet Take 200 mg by mouth every 6 (six) hours as needed.    . immune globulin, human, (GAMMAGARD S/D LESS IGA) 10 g injection Inject into the vein every 21 ( twenty-one) days.     Marland Kitchen LASTACAFT 0.25 % SOLN Apply 1 drop to eye daily.    Marland Kitchen levothyroxine (SYNTHROID) 75 MCG tablet Take one tablet by mouth once daily 30 minutes before breakfast on empty stomach. 90 tablet 1  . magnesium oxide (MAG-OX) 400 MG tablet Take 400 mg by mouth daily.     . potassium chloride SA (KLOR-CON) 20 MEQ tablet TAKE TWO TABLETS BY MOUTH DAILY (MAY DISSOLVE IN WATER) 180 tablet 1  . rOPINIRole (REQUIP) 1 MG tablet TAKE 1 TABLET BY MOUTH AT BEDTIME 90 tablet 0  . sucralfate (CARAFATE) 1 GM/10ML suspension Take 2 tsp by mouth three times daily as needed. 420 mL 1  . tiZANidine (ZANAFLEX) 2 MG tablet Take 1 tablet (2 mg total) by mouth 3 (three) times daily as needed for muscle spasms. 30 tablet 1  . torsemide (DEMADEX) 20 MG tablet TAKE 1 TABLET BY MOUTH DAILY 90 tablet 1  . triamcinolone (KENALOG) 0.1 % Apply 1 application topically 2 (two) times daily. 30 g 0  . UNABLE TO FIND Quinoa    . Buprenorphine HCl-Naloxone HCl 4-1 MG FILM Place 1 strip under the tongue 2 (two) times daily.    . Calcium Citrate 200 MG TABS Take 1 tablet by mouth daily.  (Patient not taking: Reported on 09/16/2020)    . cycloSPORINE (RESTASIS) 0.05 % ophthalmic emulsion Place 1 drop into both eyes 2 (two) times daily.    Marland Kitchen lamoTRIgine (LAMICTAL) 200 MG tablet Take 1 tablet (200 mg total) by mouth daily. 30 tablet 2  . mirtazapine (REMERON) 7.5 MG tablet Take 1 tablet (7.5  mg total) by mouth at bedtime. 30 tablet 2  . sertraline (ZOLOFT) 50 MG tablet Take 1/2 tablet daily for 7 days, then stop 30 tablet 1   No current facility-administered medications for this visit.    Medication Side Effects: None  Allergies:  Allergies  Allergen Reactions  . Tetracycline Nausea Only    Causes ringing in ears, dizziness, migraine, nausea  . Corticosteroids Other (See Comments)    12/02/2016 interview by Melissa Montane: Oral dosing causes migraines/nausea/tinnitus. Prev tolerated IV and intranasal admin w/o difficulty.  . Cefixime Other (See Comments)    Headache   . Ciprofloxacin Other (See Comments)    Headache, dizziness, ringing in the ears  . Doxycycline Other (See Comments)    unknown  . Gabapentin     Throat swells/closes  . Isometheptene-Dichloral-Apap  Other (See Comments)    unknown  . Ketorolac     12/02/2016 interview by WPY: Oral dosing prednisone/corticosteroids causes migraines/nausea/tinnitus. Prev tolerated IV and intranasal admin w/o difficulty.  . Prednisone Other (See Comments)    Migraine, dizzy, ringing in ears-can take it IV 12/02/2016 interview by [name]: Oral prednisone dosing causes migraines/nausea/tinnitus. Prev tolerated IV and intranasal admin w/o difficulty.  . Promethazine Other (See Comments)    causes severe abdominal pain when taken IV; however she can take PO or IM  . Stadol [Butorphanol]     Cerebral pain  . Erythromycin Base Diarrhea and Itching    Severe diarrhea  . Propoxyphene Nausea Only    Past Medical History:  Diagnosis Date  . Allergy   . Anemia   . Arachnoiditis   . Bipolar disorder ([place])   . Blood transfusion without reported diagnosis   . Cataract    removed  . Chronic back pain   . Chronic pain   . Chronic post-thoracotomy pain   . CKD (chronic kidney disease)   . GERD (gastroesophageal reflux disease)   . Hypogammaglobulinemia ([place])   . Hypotension   . Hypothyroid   . 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HCC)    . Lung cancer ([place]) 2011, 2014  . Memory loss   . Migraine   . Opioid abuse ([place])   . Osteoporosis   . Presence of neurostimulator   . Recurrent upper respiratory infection (URI)   . Restless legs   . Sleep apnea   . Tremor   . Urge incontinence   . Urticaria    long time ago, nothing recent    Family History  Problem Relation Age of Onset  . Hypertension Mother   . GER disease Mother   . Dementia Mother   . Osteoporosis Mother   . Arthritis Mother   . Bipolar disorder Mother   . Parkinson's disease Father   . Congestive Heart Failure Father   . Pneumonia Father   . Arthritis Father   . Dementia Father   . Depression Father   . Asthma Sister   . Depression Sister   . Bipolar disorder Brother   . Asthma Daughter   . Colon cancer Neg Hx   . Esophageal cancer Neg Hx   . Stomach cancer Neg Hx   . Rectal cancer Neg Hx     Social History   Socioeconomic History  . Marital status: Married    Spouse name: Not on file  . Number of children: 2  . Years of education: Not on file  . Highest education level: GED or equivalent  Occupational History  . Occupation: retired  Tobacco Use  . Smoking status: Never Smoker  . Smokeless tobacco: Never Used  Vaping Use  . Vaping Use: Never used  Substance and Sexual Activity  . Alcohol use: Not Currently  . Drug use: Never  . Sexual activity: Not on file  Other Topics Concern  . Not on file  Social History Narrative   Diet: None      Caffeine: coffee, tea, sodas less than or 1 daily.      Married, if yes what year: Yes, 1972      Do you live in a house, apartment, assisted living, condo, trailer, ect: Two story house       Pets: None      Current/Past profession: Teach      Exercise: None         Living Will: No  DNR: No   POA/HPOA: No      Functional Status:   Do you have difficulty bathing or dressing yourself? yes   Do you have difficulty preparing food or eating? yes   Do you have difficulty managing  your medications? yes   Do you have difficulty managing your finances?yes   Do you have difficulty affording your medications? no   Social Determinants of Health   Financial Resource Strain: Not on file  Food Insecurity: Not on file  Transportation Needs: Not on file  Physical Activity: Not on file  Stress: Not on file  Social Connections: Not on file  Intimate Partner Violence: Not on file    Past Medical History, Surgical history, Social history, and Family history were reviewed and updated as appropriate.   Please see review of systems for further details on the patient's review from today.   Objective:   Physical Exam:  There were no vitals taken for this visit.  Physical Exam Neurological:     Mental Status: She is alert and oriented to person, place, and time.     Cranial Nerves: No dysarthria.  Psychiatric:        Attention and Perception: Attention and perception normal.        Mood and Affect: Mood is depressed.        Speech: Speech normal.        Behavior: Behavior is cooperative.        Thought Content: Thought content normal. Thought content is not paranoid or delusional. Thought content does not include homicidal or suicidal ideation. Thought content does not include homicidal or suicidal plan.        Cognition and Memory: Cognition and memory normal.        Judgment: Judgment normal.     Comments: Insight intact     Lab Review:     Component Value Date/Time   NA 134 (L) 08/28/2020 1426   K 2.7 (LL) 08/28/2020 1426   CL 92 (L) 08/28/2020 1426   CO2 29 08/28/2020 1426   GLUCOSE 119 (H) 08/28/2020 1426   BUN 13 08/28/2020 1426   CREATININE 0.89 08/28/2020 1426   CREATININE 1.57 (H) 06/10/2020 1549   CALCIUM 9.4 08/28/2020 1426   PROT 7.5 08/28/2020 1426   ALBUMIN 4.2 08/28/2020 1426   AST 41 08/28/2020 1426   ALT 25 08/28/2020 1426   ALKPHOS 45 08/28/2020 1426   BILITOT 0.5 08/28/2020 1426   GFRNONAA >60 08/28/2020 1426   GFRNONAA 33 (L)  06/10/2020 1549   GFRAA 38 (L) 06/10/2020 1549       Component Value Date/Time   WBC 8.4 08/28/2020 1426   RBC 5.04 08/28/2020 1426   HGB 15.5 (H) 08/28/2020 1426   HCT 44.4 08/28/2020 1426   PLT 240 08/28/2020 1426   MCV 88.1 08/28/2020 1426   MCH 30.8 08/28/2020 1426   MCHC 34.9 08/28/2020 1426   RDW 12.1 08/28/2020 1426   LYMPHSABS 1.0 08/28/2020 1426   MONOABS 0.7 08/28/2020 1426   EOSABS 0.0 08/28/2020 1426   BASOSABS 0.1 08/28/2020 1426    No results found for: POCLITH, LITHIUM   No results found for: PHENYTOIN, PHENOBARB, VALPROATE, CBMZ   .res Assessment: Plan:   Will decrease Sertraline to 25 mg daily for 7 days, then stop.  Will re-start Remeron at lower dose of 7.5 mg po QHS to possibly minimize side effects since she reports that Remeron was helpful for her mood s/s.  Continue Lamictal 200 mg  po qd for mood s/s.  Pt to follow-up in 6-8 weeks or sooner if clinically indicated. Patient advised to contact office with any questions, adverse effects, or acute worsening in signs and symptoms.  Ranessa was seen today for depression and follow-up.  Diagnoses and all orders for this visit:  Bipolar disorder, current episode mixed, moderate (HCC) -     mirtazapine (REMERON) 7.5 MG tablet; Take 1 tablet (7.5 mg total) by mouth at bedtime. -     lamoTRIgine (LAMICTAL) 200 MG tablet; Take 1 tablet (200 mg total) by mouth daily.  Insomnia due to other mental disorder -     mirtazapine (REMERON) 7.5 MG tablet; Take 1 tablet (7.5 mg total) by mouth at bedtime.  Anxiety disorder, unspecified type -     mirtazapine (REMERON) 7.5 MG tablet; Take 1 tablet (7.5 mg total) by mouth at bedtime. -     sertraline (ZOLOFT) 50 MG tablet; Take 1/2 tablet daily for 7 days, then stop    Please see After Visit Summary for patient specific instructions.  Future Appointments  Date Time Provider Media  09/27/2020 11:00 AM [name], NP [facility] None  11/15/2020  3:30 PM  Narda Amber K, DO [facility] None  12/09/2020  3:15 PM [name], NP [facility] None    No orders of the defined types were placed in this encounter.     -------------------------------

## 2020-09-27 ENCOUNTER — Other Ambulatory Visit: Payer: Self-pay

## 2020-09-27 ENCOUNTER — Ambulatory Visit (INDEPENDENT_AMBULATORY_CARE_PROVIDER_SITE_OTHER): Payer: Medicare PPO | Admitting: Nurse Practitioner

## 2020-09-27 ENCOUNTER — Telehealth: Payer: Self-pay

## 2020-09-27 DIAGNOSIS — Z0000 Encounter for general adult medical examination without abnormal findings: Secondary | ICD-10-CM | POA: Diagnosis not present

## 2020-09-27 NOTE — Progress Notes (Signed)
Subjective:   Megan Brewer is a 75 y.o. female who presents for Medicare Annual (Subsequent) preventive examination.  Review of Systems     Cardiac Risk Factors include: sedentary lifestyle;advanced age (>25men, >53 women)     Objective:    Today's Vitals   09/27/20 1110  PainSc: 8    There is no height or weight on file to calculate BMI.  Advanced Directives 09/27/2020 08/05/2020 06/10/2020 03/25/2020 01/19/2020 12/08/2019 11/13/2019  Does Patient Have a Medical Advance Directive? No No No No No No No  Type of Advance Directive - - - - - - -  Does patient want to make changes to medical advance directive? - - - No - Patient declined - - -  Copy of [place] in [place]  Would patient like information on creating a medical advance directive? No - Patient declined No - Patient declined No - Patient declined - No - Patient declined - -    Current Medications (verified) Outpatient Encounter Medications as of 09/27/2020  Medication Sig  . acetaminophen (TYLENOL) 500 MG tablet Take 1-2 tablets (500-1,000 mg total) by mouth every 8 (eight) hours as needed (Max dose 3000 mg in 24 hours).  Marland Kitchen azelastine (ASTELIN) 0.1 % nasal spray SMARTSIG:1 Spray(s) Both Nares 1 to 2 Times Daily PRN  . baclofen (LIORESAL) 10 MG tablet Take 10 mg by mouth every 28 (twenty-eight) days.  . Buprenorphine HCl-Naloxone HCl 4-1 MG FILM Place 1 strip under the tongue 2 (two) times daily.  . Calcium Citrate 200 MG TABS Take 1 tablet by mouth daily.  . cycloSPORINE (RESTASIS) 0.05 % ophthalmic emulsion Place 1 drop into both eyes 2 (two) times daily.  Marland Kitchen dexlansoprazole (DEXILANT) 60 MG capsule Take 1 capsule (60 mg total) by mouth daily. Crack open the capsule and pour into spoon prior to ingestion  . Erenumab-aooe (AIMOVIG, 140 MG DOSE,) 70 MG/ML SOAJ Inject into the skin every 30 (thirty) days.  Marland Kitchen estradiol (ESTRACE) 0.1 MG/GM vaginal cream 3 (three) times a week.  . ferrous  sulfate 325 (65 FE) MG tablet Take 325 mg by mouth 2 (two) times daily with a meal.  . ibuprofen (ADVIL) 200 MG tablet Take 200 mg by mouth every 6 (six) hours as needed.  . immune globulin, human, (GAMMAGARD S/D LESS IGA) 10 g injection Inject into the vein every 21 ( twenty-one) days.   Marland Kitchen lamoTRIgine (LAMICTAL) 200 MG tablet Take 1 tablet (200 mg total) by mouth daily.  Marland Kitchen LASTACAFT 0.25 % SOLN Apply 1 drop to eye daily.  Marland Kitchen levothyroxine (SYNTHROID) 75 MCG tablet Take one tablet by mouth once daily 30 minutes before breakfast on empty stomach.  . magnesium oxide (MAG-OX) 400 MG tablet Take 400 mg by mouth daily.   . mirtazapine (REMERON) 7.5 MG tablet Take 1 tablet (7.5 mg total) by mouth at bedtime.  . potassium chloride SA (KLOR-CON) 20 MEQ tablet TAKE TWO TABLETS BY MOUTH DAILY (MAY DISSOLVE IN WATER)  . rOPINIRole (REQUIP) 1 MG tablet TAKE 1 TABLET BY MOUTH AT BEDTIME  . sucralfate (CARAFATE) 1 GM/10ML suspension Take 2 tsp by mouth three times daily as needed.  Marland Kitchen tiZANidine (ZANAFLEX) 2 MG tablet Take 1 tablet (2 mg total) by mouth 3 (three) times daily as needed for muscle spasms.  Marland Kitchen torsemide (DEMADEX) 20 MG tablet TAKE 1 TABLET BY MOUTH DAILY  . triamcinolone (KENALOG) 0.1 % Apply 1 application topically 2 (two) times  daily.  Marland Kitchen UNABLE TO FIND Quinoa (Patient not taking: Reported on 09/27/2020)  . [DISCONTINUED] sertraline (ZOLOFT) 50 MG tablet Take 1/2 tablet daily for 7 days, then stop   No facility-administered encounter medications on file as of 09/27/2020.    Allergies (verified) Tetracycline, Corticosteroids, Cefixime, Ciprofloxacin, Doxycycline, Gabapentin, Isometheptene-dichloral-apap, Ketorolac, Prednisone, Promethazine, Stadol [butorphanol], Erythromycin base, and Propoxyphene   History: Past Medical History:  Diagnosis Date  . Allergy   . Anemia   . Arachnoiditis   . Bipolar disorder ([place])   . Blood transfusion without reported diagnosis   . Cataract    removed  .  Chronic back pain   . Chronic pain   . Chronic post-thoracotomy pain   . CKD (chronic kidney disease)   . GERD (gastroesophageal reflux disease)   . Hypogammaglobulinemia ([place])   . Hypotension   . Hypothyroid   . 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 Lung cancer ([place]) 2011, 2014  . Memory loss   . Migraine   . Opioid abuse ([place])   . Osteoporosis   . Presence of neurostimulator   . Recurrent upper respiratory infection (URI)   . Restless legs   . Sleep apnea   . Tremor   . Urge incontinence   . Urticaria    long time ago, nothing recent   Past Surgical History:  Procedure Laterality Date  . ABDOMINAL HYSTERECTOMY    . CARPAL TUNNEL RELEASE    . CATARACT EXTRACTION Bilateral 2019  . CHOLECYSTECTOMY    . COLONOSCOPY  2015   [facility]  . CT LUNG SCREENING  2018  . DG  BONE DENSITY ([place] HX)  2018  . DIAGNOSTIC MAMMOGRAM  2019  . GASTRIC BYPASS    . LUNG CANCER SURGERY  02/2010  . MULTIPLE TOOTH EXTRACTIONS    . SPINAL CORD STIMULATOR IMPLANT     Family History  Problem Relation Age of Onset  . Hypertension Mother   . GER disease Mother   . Dementia Mother   . Osteoporosis Mother   . Arthritis Mother   . Bipolar disorder Mother   . Parkinson's disease Father   . Congestive Heart Failure Father   . Pneumonia Father   . Arthritis Father   . Dementia Father   . Depression Father   . Asthma Sister   . Depression Sister   . Bipolar disorder Brother   . Asthma Daughter   . Colon cancer Neg Hx   . Esophageal cancer Neg Hx   . Stomach cancer Neg Hx   . Rectal cancer Neg Hx    Social History   Socioeconomic History  . Marital status: Married    Spouse name: Not on file  . Number of children: 2  . Years of education: Not on file  . Highest education level: GED or equivalent  Occupational History  . Occupation: retired  Tobacco Use  . Smoking status: Never Smoker  . Smokeless tobacco: Never Used  Vaping Use  . Vaping Use: Never used  Substance and Sexual  Activity  . Alcohol use: Not Currently  . Drug use: Never  . Sexual activity: Not on file  Other Topics Concern  . Not on file  Social History Narrative   Diet: None      Caffeine: coffee, tea, sodas less than or 1 daily.      Married, if yes what year: Yes, 1972      Do you live in a house, apartment, assisted living, condo, trailer, ect:  Two story house       Pets: None      Current/Past profession: Teach      Exercise: None         Living Will: No   DNR: No   POA/HPOA: No      Functional Status:   Do you have difficulty bathing or dressing yourself? yes   Do you have difficulty preparing food or eating? yes   Do you have difficulty managing your medications? yes   Do you have difficulty managing your finances?yes   Do you have difficulty affording your medications? no   Social Determinants of Health   Financial Resource Strain: Not on file  Food Insecurity: Not on file  Transportation Needs: Not on file  Physical Activity: Not on file  Stress: Not on file  Social Connections: Not on file    Tobacco Counseling Counseling given: Not Answered   Clinical Intake:     Pain : 0-10 Pain Score: 8  Pain Location: Rib cage Pain Orientation: Right Pain Descriptors / Indicators: Sharp,Stabbing Pain Onset: More than a month ago Pain Frequency: Constant Pain Relieving Factors: pain medication. Effect of Pain on Daily Activities: can not do heavy lifting, and tires easily, can not drive, can not cook or clean  Pain Relieving Factors: pain medication.  BMI - recorded: 21 Nutritional Status: BMI of 19-24  Normal Nutritional Risks: Nausea/ vomitting/ diarrhea Diabetes: No  How often do you need to have someone help you when you read instructions, pamphlets, or other written materials from your doctor or pharmacy?: 1 - Never  Diabetic?no         Activities of Daily Living In your present state of health, do you have any difficulty performing the following  activities: 09/27/2020  Hearing? N  Vision? N  Difficulty concentrating or making decisions? N  Walking or climbing stairs? Y  Comment needs rails to climb stairs  Dressing or bathing? N  Doing errands, shopping? N  Preparing Food and eating ? N  Using the Toilet? N  In the past six months, have you accidently leaked urine? N  Do you have problems with loss of bowel control? N  Managing your Medications? N  Managing your Finances? N  Housekeeping or managing your Housekeeping? Y  Some recent data might be hidden    Patient Care Team: [name], NP as PCP - General (Geriatric Medicine) Alda Berthold, DO as Consulting Physician (Neurology)  Indicate any recent Medical Services you may have received from other than [facility] providers in the past year (date may be approximate).     Assessment:   This is a routine wellness examination for Keiko.  Hearing/Vision screen  Hearing Screening   125Hz  250Hz  500Hz  1000Hz  2000Hz  3000Hz  4000Hz  6000Hz  8000Hz   Right ear:           Left ear:           Comments: Patient has no hearing problems.  Vision Screening Comments: Patient has no vision problems. Patient had eye exam within past year. Sees Dr. Nancy Fetter  Dietary issues and exercise activities discussed: Current Exercise Habits: The patient does not participate in regular exercise at present  Goals   None    Depression Screen PHQ 2/9 Scores 09/27/2020 06/10/2020 09/25/2019 07/05/2019 01/27/2019  PHQ - 2 Score 0 0 0 0 -  Exception Documentation - - (No Data) - Medical reason    Fall Risk Fall Risk  09/27/2020 08/05/2020 06/10/2020 03/25/2020 01/19/2020  Falls  in the past year? 1 1 0 0 0  Number falls in past yr: 1 1 0 0 0  Injury with Fall? 0 0 0 0 0    FALL RISK PREVENTION PERTAINING TO THE HOME:  Any stairs in or around the home? Yes  If so, are there any without handrails? No  Home free of loose throw rugs in walkways, pet beds, electrical cords, etc? Yes  Adequate lighting  in your home to reduce risk of falls? Yes   ASSISTIVE DEVICES UTILIZED TO PREVENT FALLS:  Life alert? No  Use of a cane, walker or w/c? No  Grab bars in the bathroom? Yes  Shower chair or bench in shower? Yes  Elevated toilet seat or a handicapped toilet? No   TIMED UP AND GO:  Was the test performed? No .    Cognitive Function:     6CIT Screen 09/27/2020 09/25/2019  What Year? 0 points 0 points  What month? 0 points 0 points  What time? 0 points 0 points  Count back from 20 2 points 0 points  Months in reverse 0 points 0 points  Repeat phrase 0 points 2 points  Total Score 2 2    Immunizations Immunization History  Administered Date(s) Administered  . Fluad Quad(high Dose 65+) 06/06/2019, 05/13/2020  . Influenza, High Dose Seasonal PF 06/06/2019  . Influenza, Seasonal, Injecte, Preservative Fre 11/14/2012  . Influenza,inj,Quad PF,6+ Mos 05/24/2018  . Influenza-Unspecified 11/14/2012, 04/17/2016, 06/09/2017, 05/24/2018  . PFIZER(Purple Top)SARS-COV-2 Vaccination 09/07/2019, 09/28/2019, 03/30/2020  . Pneumococcal Conjugate-13 07/29/2017  . Pneumococcal Polysaccharide-23 07/29/2017, 06/10/2020  . Td 05/30/2020  . Tdap 05/30/2017  . Zoster Recombinat (Shingrix) 06/17/2017    TDAP status: Up to date  Flu Vaccine status: Up to date  Pneumococcal vaccine status: Up to date  Covid-19 vaccine status: Completed vaccines  Qualifies for Shingles Vaccine? Yes   Zostavax completed Yes   Shingrix Completed?: Yes  Screening Tests Health Maintenance  Topic Date Due  . COVID-19 Vaccine (4 - Booster for Pfizer series) 09/30/2020  . MAMMOGRAM  02/12/2021  . PNA vac Low Risk Adult (2 of 2 - PCV13) 06/10/2021  . COLONOSCOPY (Pts 45-42yrs Insurance coverage will need to be confirmed)  01/01/2027  . TETANUS/TDAP  05/30/2030  . INFLUENZA VACCINE  Completed  . DEXA SCAN  Completed  . Hepatitis C Screening  Completed    Health Maintenance  Health Maintenance Due  Topic Date  Due  . COVID-19 Vaccine (4 - Booster for Pfizer series) 09/30/2020    Colorectal cancer screening: Type of screening: Colonoscopy. Completed 2021. Repeat every 7 years  Mammogram status: Completed 2020. Repeat every year  Bone Density status: Completed 03/2019. Results reflect: Bone density results: OSTEOPOROSIS. Repeat every 2 years.  Lung Cancer Screening: (Low Dose CT Chest recommended if Age 7-80 years, 30 pack-year currently smoking OR have quit w/in 15years.) does not qualify.   Additional Screening:  Hepatitis C Screening: does qualify; Completed 2021   Vision Screening: Recommended annual ophthalmology exams for early detection of glaucoma and other disorders of the eye. Is the patient up to date with their annual eye exam?  Yes  Who is the provider or what is the name of the office in which the patient attends annual eye exams? Dr Nancy Fetter If pt is not established with a provider, would they like to be referred to a provider to establish care? No .   Dental Screening: Recommended annual dental exams for proper oral hygiene  [employer]  Referral / Chronic Care Management: CRR required this visit?  No   CCM required this visit?  No      Plan:     I have personally reviewed and noted the following in the patient's chart:   . Medical and social history . Use of alcohol, tobacco or illicit drugs  . Current medications and supplements . Functional ability and status . Nutritional status . Physical activity . Advanced directives . List of other physicians . Hospitalizations, surgeries, and ER visits in previous 12 months . Vitals . Screenings to include cognitive, depression, and falls . Referrals and appointments  In addition, I have reviewed and discussed with patient certain preventive protocols, quality metrics, and best practice recommendations. A written personalized care plan for preventive services as well as general preventive health recommendations were  provided to patient.     [name], NP   09/27/2020    Virtual Visit via Telephone Note  I connected with@ on 09/27/20 at 11:00 AM EST by telephone and verified that I am speaking with the correct person using two identifiers.  Location: Patient: home Provider: [facility] clinic   I discussed the limitations, risks, security and privacy concerns of performing an evaluation and management service by telephone and the availability of in person appointments. I also discussed with the patient that there may be a patient responsible charge related to this service. The patient expressed understanding and agreed to proceed.   I discussed the assessment and treatment plan with the patient. The patient was provided an opportunity to ask questions and all were answered. The patient agreed with the plan and demonstrated an understanding of the instructions.   The patient was advised to call back or seek an in-person evaluation if the symptoms worsen or if the condition fails to improve as anticipated.  I provided 20 minutes of non-face-to-face time during this encounter.  [employer]. Harle Battiest [employer] printed and mailed

## 2020-09-27 NOTE — Patient Instructions (Signed)
[name] , Thank you for taking time to come for your Medicare Wellness Visit. I appreciate your ongoing commitment to your health goals. Please review the following plan we discussed and let me know if I can assist you in the future.   Screening recommendations/referrals: Colonoscopy up to date Mammogram recommended at this time call [phone] Bone Density Due in August [phone] Recommended yearly ophthalmology/optometry visit for glaucoma screening and checkup Recommended yearly dental visit for hygiene and checkup  Vaccinations: Influenza vaccine -up to date Pneumococcal vaccine up to date Tdap vaccine up to date Shingles vaccine -up to date    Advanced directives: recommended to complete   Conditions/risks identified: advanced age, fall risk  Next appointment: 1 year   Preventive Care 12 Years and Older, Female Preventive care refers to lifestyle choices and visits with your health care provider that can promote health and wellness. What does preventive care include?  A yearly physical exam. This is also called an annual well check.  Dental exams once or twice a year.  Routine eye exams. Ask your health care provider how often you should have your eyes checked.  Personal lifestyle choices, including:  Daily care of your teeth and gums.  Regular physical activity.  Eating a healthy diet.  Avoiding tobacco and drug use.  Limiting alcohol use.  Practicing safe sex.  Taking low-dose aspirin every day.  Taking vitamin and mineral supplements as recommended by your health care provider. What happens during an annual well check? The services and screenings done by your health care provider during your annual well check will depend on your age, overall health, lifestyle risk factors, and family history of disease. Counseling  Your health care provider may ask you questions about your:  Alcohol use.  Tobacco use.  Drug use.  Emotional well-being.  Home  and relationship well-being.  Sexual activity.  Eating habits.  History of falls.  Memory and ability to understand (cognition).  Work and work Statistician.  Reproductive health. Screening  You may have the following tests or measurements:  Height, weight, and BMI.  Blood pressure.  Lipid and cholesterol levels. These may be checked every 5 years, or more frequently if you are over 25 years old.  Skin check.  Lung cancer screening. You may have this screening every year starting at age 25 if you have a 30-pack-year history of smoking and currently smoke or have quit within the past 15 years.  Fecal occult blood test (FOBT) of the stool. You may have this test every year starting at age 6.  Flexible sigmoidoscopy or colonoscopy. You may have a sigmoidoscopy every 5 years or a colonoscopy every 10 years starting at age 75.  Hepatitis C blood test.  Hepatitis B blood test.  Sexually transmitted disease (STD) testing.  Diabetes screening. This is done by checking your blood sugar (glucose) after you have not eaten for a while (fasting). You may have this done every 1-3 years.  Bone density scan. This is done to screen for osteoporosis. You may have this done starting at age 69.  Mammogram. This may be done every 1-2 years. Talk to your health care provider about how often you should have regular mammograms. Talk with your health care provider about your test results, treatment options, and if necessary, the need for more tests. Vaccines  Your health care provider may recommend certain vaccines, such as:  Influenza vaccine. This is recommended every year.  Tetanus, diphtheria, and acellular pertussis (Tdap, Td) vaccine. You  may need a Td booster every 10 years.  Zoster vaccine. You may need this after age 6.  Pneumococcal 13-valent conjugate (PCV13) vaccine. One dose is recommended after age 86.  Pneumococcal polysaccharide (PPSV23) vaccine. One dose is recommended  after age 97. Talk to your health care provider about which screenings and vaccines you need and how often you need them. This information is not intended to replace advice given to you by your health care provider. Make sure you discuss any questions you have with your health care provider. Document Released: 08/30/2015 Document Revised: 04/22/2016 Document Reviewed: 06/04/2015 Elsevier Interactive Patient Education  2017 [employer] Prevention in the Home Falls can cause injuries. They can happen to people of all ages. There are many things you can do to make your home safe and to help prevent falls. What can I do on the outside of my home?  Regularly fix the edges of walkways and driveways and fix any cracks.  Remove anything that might make you trip as you walk through a door, such as a raised step or threshold.  Trim any bushes or trees on the path to your home.  Use bright outdoor lighting.  Clear any walking paths of anything that might make someone trip, such as rocks or tools.  Regularly check to see if handrails are loose or broken. Make sure that both sides of any steps have handrails.  Any raised decks and porches should have guardrails on the edges.  Have any leaves, snow, or ice cleared regularly.  Use sand or salt on walking paths during winter.  Clean up any spills in your garage right away. This includes oil or grease spills. What can I do in the bathroom?  Use night lights.  Install grab bars by the toilet and in the tub and shower. Do not use towel bars as grab bars.  Use non-skid mats or decals in the tub or shower.  If you need to sit down in the shower, use a plastic, non-slip stool.  Keep the floor dry. Clean up any water that spills on the floor as soon as it happens.  Remove soap buildup in the tub or shower regularly.  Attach bath mats securely with double-sided non-slip rug tape.  Do not have throw rugs and other things on the floor  that can make you trip. What can I do in the bedroom?  Use night lights.  Make sure that you have a light by your bed that is easy to reach.  Do not use any sheets or blankets that are too big for your bed. They should not hang down onto the floor.  Have a firm chair that has side arms. You can use this for support while you get dressed.  Do not have throw rugs and other things on the floor that can make you trip. What can I do in the kitchen?  Clean up any spills right away.  Avoid walking on wet floors.  Keep items that you use a lot in easy-to-reach places.  If you need to reach something above you, use a strong step stool that has a grab bar.  Keep electrical cords out of the way.  Do not use floor polish or wax that makes floors slippery. If you must use wax, use non-skid floor wax.  Do not have throw rugs and other things on the floor that can make you trip. What can I do with my stairs?  Do not leave any items  on the stairs.  Make sure that there are handrails on both sides of the stairs and use them. Fix handrails that are broken or loose. Make sure that handrails are as long as the stairways.  Check any carpeting to make sure that it is firmly attached to the stairs. Fix any carpet that is loose or worn.  Avoid having throw rugs at the top or bottom of the stairs. If you do have throw rugs, attach them to the floor with carpet tape.  Make sure that you have a light switch at the top of the stairs and the bottom of the stairs. If you do not have them, ask someone to add them for you. What else can I do to help prevent falls?  Wear shoes that:  Do not have high heels.  Have rubber bottoms.  Are comfortable and fit you well.  Are closed at the toe. Do not wear sandals.  If you use a stepladder:  Make sure that it is fully opened. Do not climb a closed stepladder.  Make sure that both sides of the stepladder are locked into place.  Ask someone to hold it  for you, if possible.  Clearly mark and make sure that you can see:  Any grab bars or handrails.  First and last steps.  Where the edge of each step is.  Use tools that help you move around (mobility aids) if they are needed. These include:  Canes.  Walkers.  Scooters.  Crutches.  Turn on the lights when you go into a dark area. Replace any light bulbs as soon as they burn out.  Set up your furniture so you have a clear path. Avoid moving your furniture around.  If any of your floors are uneven, fix them.  If there are any pets around you, be aware of where they are.  Review your medicines with your doctor. Some medicines can make you feel dizzy. This can increase your chance of falling. Ask your doctor what other things that you can do to help prevent falls. This information is not intended to replace advice given to you by your health care provider. Make sure you discuss any questions you have with your health care provider. Document Released: 05/30/2009 Document Revised: 01/09/2016 Document Reviewed: 09/07/2014 Elsevier Interactive Patient Education  2017 [employer].

## 2020-09-27 NOTE — Progress Notes (Signed)
This service is provided via telemedicine  No vital signs collected/recorded due to the encounter was a telemedicine visit.   Location of patient (ex: home, work):  Home  Patient consents to a telephone visit: Yes, see encounter dated 09/27/2020  Location of the provider (ex: office, home):  [facility]  Name of any referring provider:  N/A  Names of all persons participating in the telemedicine service and their role in the encounter:  Sherrie Mustache, Nurse Practitioner, Carroll Kinds, CMA, and patient.   Time spent on call:  12 minutes with medical assistant

## 2020-09-27 NOTE — Telephone Encounter (Signed)
Ms. rifka, ramey are scheduled for a virtual visit with your provider today.    Just as we do with appointments in the office, we must obtain your consent to participate.  Your consent will be active for this visit and any virtual visit you may have with one of our providers in the next 365 days.    If you have a MyChart account, I can also send a copy of this consent to you electronically.  All virtual visits are billed to your insurance company just like a traditional visit in the office.  As this is a virtual visit, video technology does not allow for your provider to perform a traditional examination.  This may limit your provider's ability to fully assess your condition.  If your provider identifies any concerns that need to be evaluated in person or the need to arrange testing such as labs, EKG, etc, we will make arrangements to do so.    Although advances in technology are sophisticated, we cannot ensure that it will always work on either your end or our end.  If the connection with a video visit is poor, we may have to switch to a telephone visit.  With either a video or telephone visit, we are not always able to ensure that we have a secure connection.   I need to obtain your verbal consent now.   Are you willing to proceed with your visit today?   Denay Pleitez Jerrett has provided verbal consent on 09/27/2020 for a virtual visit (video or telephone).   Carroll Kinds, CMA 09/27/2020  11:02 AM

## 2020-10-01 ENCOUNTER — Other Ambulatory Visit: Payer: Self-pay | Admitting: Allergy and Immunology

## 2020-10-03 ENCOUNTER — Telehealth: Payer: Self-pay | Admitting: Psychiatry

## 2020-10-03 DIAGNOSIS — G2401 Drug induced subacute dyskinesia: Secondary | ICD-10-CM

## 2020-10-03 MED ORDER — INGREZZA 80 MG PO CAPS
80.0000 mg | ORAL_CAPSULE | Freq: Every day | ORAL | 1 refills | Status: DC
Start: 2020-10-03 — End: 2020-11-22

## 2020-10-03 MED ORDER — INGREZZA 40 & 80 MG PO CPPK: ORAL_CAPSULE | ORAL | 0 refills | Status: DC

## 2020-10-03 NOTE — Telephone Encounter (Signed)
She reports, "I love the new medication" and that Remeron is effective for her. She reports that TD is worse- her hands are shaking more. She reports that her knees bend back and forth. She reports that her feet "shake inside the shoe" causing some unsteadiness. She reports that involuntary movements have been worsening for the last 3 months and significantly worse in the last 3-4 weeks.   Discussed potential benefits, risks, and side effects of Ingrezza. Will pull samples and send prescription to [facility].

## 2020-10-03 NOTE — Telephone Encounter (Signed)
Pt called reporting her TD is getting really bad. Contact @ [phone]. Due for follow up, not scheduled.

## 2020-10-03 NOTE — Telephone Encounter (Signed)
Attempted to reach pt. L/M for pt

## 2020-10-10 ENCOUNTER — Telehealth: Payer: Self-pay | Admitting: Psychiatry

## 2020-10-10 NOTE — Telephone Encounter (Signed)
error 

## 2020-10-11 ENCOUNTER — Telehealth: Payer: Self-pay | Admitting: Psychiatry

## 2020-10-11 NOTE — Telephone Encounter (Signed)
Megan Brewer called to report that she has messed up taking her ingrezza.  She knows there are two steps but she can't find the directions and doesn't know how to take the medication correctly.  Please call to clarify with her how to take the ingrezza.  She has appts today so please don't call until after 4pm.

## 2020-10-11 NOTE — Telephone Encounter (Signed)
Samples have been pulled and she is on the way to get them now.

## 2020-10-11 NOTE — Telephone Encounter (Signed)
I spoke to the pt and the problem is that she lost the 80 mg sample completely.She would like to come by today and get another one before the office closes.

## 2020-10-14 ENCOUNTER — Ambulatory Visit (INDEPENDENT_AMBULATORY_CARE_PROVIDER_SITE_OTHER): Payer: Medicare PPO | Admitting: *Deleted

## 2020-10-14 ENCOUNTER — Other Ambulatory Visit: Payer: Self-pay

## 2020-10-14 DIAGNOSIS — M810 Age-related osteoporosis without current pathological fracture: Secondary | ICD-10-CM | POA: Diagnosis not present

## 2020-10-14 MED ORDER — DENOSUMAB 60 MG/ML ~~LOC~~ SOSY
60.0000 mg | PREFILLED_SYRINGE | Freq: Once | SUBCUTANEOUS | Status: AC
  Administered 2020-10-14: 60 mg via SUBCUTANEOUS

## 2020-10-14 NOTE — Telephone Encounter (Signed)
Reviewed

## 2020-10-21 ENCOUNTER — Telehealth: Payer: Self-pay | Admitting: Psychiatry

## 2020-10-21 NOTE — Telephone Encounter (Signed)
Pt called and left a message stating that she received jessica's phone call. However, she has not received a phone call from [facility] about the delivery of the ingrezza

## 2020-10-21 NOTE — Telephone Encounter (Signed)
Was a Rx to go to [facility]? It's also a factor on if they accept her insurance as well. Let me know if I need to contact them?

## 2020-10-22 NOTE — Telephone Encounter (Signed)
Unable to speak to [place] today at [place] but will call back again tomorrow.

## 2020-10-23 ENCOUNTER — Telehealth: Payer: Self-pay | Admitting: Psychiatry

## 2020-10-23 DIAGNOSIS — F99 Mental disorder, not otherwise specified: Secondary | ICD-10-CM

## 2020-10-23 DIAGNOSIS — F419 Anxiety disorder, unspecified: Secondary | ICD-10-CM

## 2020-10-23 DIAGNOSIS — F5105 Insomnia due to other mental disorder: Secondary | ICD-10-CM

## 2020-10-23 MED ORDER — HYDROXYZINE PAMOATE 25 MG PO CAPS: 25.0000 mg | ORAL_CAPSULE | Freq: Two times a day (BID) | ORAL | 0 refills | Status: DC | PRN

## 2020-10-23 NOTE — Telephone Encounter (Signed)
Sorry just seeing her message but patient reports she has had 3 family emergencies and her "nerves are shot." Asking if she can have something for anxiety. Just shaking in the inside. Informed patient I would update Megan Brewer to review her request and we would get to it as soon as we good. She was appreciative.   Uses [facility].

## 2020-10-23 NOTE — Telephone Encounter (Signed)
Pt called and said that she has had 3 bad things happen to and she is shaking all over. Please call her at [phone]

## 2020-10-23 NOTE — Telephone Encounter (Signed)
Megan Brewer is back from vacation today so she has a pile of them she is working on. She did receive it and said it should go through pretty easy with the [facility]. She also said pt had called and talked to other staff about it as well.

## 2020-10-25 NOTE — Telephone Encounter (Signed)
Left detailed message on her medication and if she's been able to try it yet. Advised to call back with any questions or concerns.

## 2020-10-31 ENCOUNTER — Telehealth: Payer: Self-pay | Admitting: Psychiatry

## 2020-10-31 NOTE — Telephone Encounter (Signed)
According to [facility] pt needs a prior auth and they aren't able to do it due to being 2 weeks behind on prior auths.

## 2020-10-31 NOTE — Telephone Encounter (Signed)
Patient called in today stating that she has been waiting on her medication Ingrezza for a few days now. She has called and cannot get through. She has been out of meds for a week now. States that she needs help on what she should do now. She would like a CB ASAP. States that she will be running errands, but can call her husband's cell. Ph: [phone]

## 2020-10-31 NOTE — Telephone Encounter (Signed)
Her PA is approved now. So [facility] normally next days medication but I'm sure it won't be until tomorrow since they close at 5 pm. We will get some samples for her to pick up in the morning and let her know.

## 2020-11-01 ENCOUNTER — Telehealth: Payer: Self-pay

## 2020-11-01 NOTE — Telephone Encounter (Signed)
Prior Authorization approved for INGREZZA 80 MG DAILY with [employer] ID# [id number] Effective 10/31/2020-01/31/2021, PA# [id number]

## 2020-11-07 ENCOUNTER — Other Ambulatory Visit: Payer: Self-pay | Admitting: Psychiatry

## 2020-11-07 DIAGNOSIS — F5105 Insomnia due to other mental disorder: Secondary | ICD-10-CM

## 2020-11-07 DIAGNOSIS — F99 Mental disorder, not otherwise specified: Secondary | ICD-10-CM

## 2020-11-07 DIAGNOSIS — F419 Anxiety disorder, unspecified: Secondary | ICD-10-CM

## 2020-11-15 ENCOUNTER — Ambulatory Visit: Payer: Medicare PPO | Admitting: Neurology

## 2020-11-18 ENCOUNTER — Other Ambulatory Visit: Payer: Self-pay

## 2020-11-18 ENCOUNTER — Encounter: Payer: Self-pay | Admitting: Nurse Practitioner

## 2020-11-18 ENCOUNTER — Telehealth: Payer: Self-pay | Admitting: Psychiatry

## 2020-11-18 ENCOUNTER — Ambulatory Visit (INDEPENDENT_AMBULATORY_CARE_PROVIDER_SITE_OTHER): Payer: Medicare PPO | Admitting: Nurse Practitioner

## 2020-11-18 VITALS — BP 106/68 | HR 76 | Temp 96.9°F | Ht 62.5 in | Wt 117.0 lb

## 2020-11-18 DIAGNOSIS — F3162 Bipolar disorder, current episode mixed, moderate: Secondary | ICD-10-CM | POA: Diagnosis not present

## 2020-11-18 DIAGNOSIS — H9113 Presbycusis, bilateral: Secondary | ICD-10-CM

## 2020-11-18 DIAGNOSIS — D803 Selective deficiency of immunoglobulin G [IgG] subclasses: Secondary | ICD-10-CM

## 2020-11-18 DIAGNOSIS — G2401 Drug induced subacute dyskinesia: Secondary | ICD-10-CM | POA: Diagnosis not present

## 2020-11-18 NOTE — Progress Notes (Signed)
Careteam: Patient Care Team: [name], NP as PCP - General (Geriatric Medicine) Alda Berthold, DO as Consulting Physician (Neurology)  [facility]ICE[facility]  [facility]  Advanced Directive information    Allergies  Allergen Reactions  . Tetracycline Nausea Only    Causes ringing in ears, dizziness, migraine, nausea  . Corticosteroids Other (See Comments)    12/02/2016 interview by Melissa Montane: Oral dosing causes migraines/nausea/tinnitus. Prev tolerated IV and intranasal admin w/o difficulty.  . Cefixime Other (See Comments)    Headache   . Ciprofloxacin Other (See Comments)    Headache, dizziness, ringing in the ears  . Doxycycline Other (See Comments)    unknown  . Gabapentin     Throat swells/closes  . Isometheptene-Dichloral-Apap Other (See Comments)    unknown  . Ketorolac     12/02/2016 interview by [facility]: Oral dosing prednisone/corticosteroids causes migraines/nausea/tinnitus. Prev tolerated IV and intranasal admin w/o difficulty.  . Prednisone Other (See Comments)    Migraine, dizzy, ringing in ears-can take it IV 12/02/2016 interview by [name]: Oral prednisone dosing causes migraines/nausea/tinnitus. Prev tolerated IV and intranasal admin w/o difficulty.  . Promethazine Other (See Comments)    causes severe abdominal pain when taken IV; however she can take PO or IM  . Stadol [Butorphanol]     Cerebral pain  . Erythromycin Base Diarrhea and Itching    Severe diarrhea  . Propoxyphene Nausea Only    Chief Complaint  Patient presents with  . Acute Visit    Referral request to audiologist and discuss frequent falls (moderate fall risk)      HPI: Patient is a 75 y.o. female   She reports she got really sick in November and took her until January to get better.  Had several falls in January due to her legs giving out. She has fallen every night for the last 3 nights.  Theres no pain but her legs just go out and she falls.  Not dizzy. No double vision, slurred  speech.  Fall to the floor. Arms can not help out.  Has to lay there for a few hours before she can crawl back into the recliner. No LOC, no headaches, blurred vision.   Started taking ingrezza from her psychiatrist group.  Thought this may be contributing.  Has not been taking baclofen, tizanidine has been stopped.   She is feeling nauseous felt like this could be due to ingrezza or recent antibiotic  Placed on zpac due to sinusitis which she reports has improved.   Reports she will have a slight headache the next morning after the fall. Otherwise no injury.  Getting ingrezza from a speciality pharmacy.   Review of Systems:  Review of Systems  Constitutional: Negative for chills, fever and weight loss.  HENT: Positive for congestion.   Respiratory: Negative for cough.   Cardiovascular: Positive for leg swelling (stable). Negative for chest pain.  Neurological: Positive for tremors. Negative for dizziness and headaches.   Past Medical History:  Diagnosis Date  . Allergy   . Anemia   . Arachnoiditis   . Bipolar disorder ([place])   . Blood transfusion without reported diagnosis   . Cataract    removed  . Chronic back pain   . Chronic pain   . Chronic post-thoracotomy pain   . CKD (chronic kidney disease)   . GERD (gastroesophageal reflux disease)   . Hypogammaglobulinemia ([place])   . Hypotension   . Hypothyroid   . 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  Lung cancer ([place]) 2011, 2014  . Memory loss   . Migraine   . Opioid abuse ([place])   . Osteoporosis   . Presence of neurostimulator   . Recurrent upper respiratory infection (URI)   . Restless legs   . Sleep apnea   . Tremor   . Urge incontinence   . Urticaria    long time ago, nothing recent   Past Surgical History:  Procedure Laterality Date  . ABDOMINAL HYSTERECTOMY    . CARPAL TUNNEL RELEASE    . CATARACT EXTRACTION Bilateral 2019  . CHOLECYSTECTOMY    . COLONOSCOPY  2015   [facility]  . CT LUNG SCREENING  2018   . DG  BONE DENSITY ([place] HX)  2018  . DIAGNOSTIC MAMMOGRAM  2019  . GASTRIC BYPASS    . LUNG CANCER SURGERY  02/2010  . MULTIPLE TOOTH EXTRACTIONS    . SPINAL CORD STIMULATOR IMPLANT     Social History:   reports that she has never smoked. She has never used smokeless tobacco. She reports previous alcohol use. She reports that she does not use drugs.  Family History  Problem Relation Age of Onset  . Hypertension Mother   . GER disease Mother   . Dementia Mother   . Osteoporosis Mother   . Arthritis Mother   . Bipolar disorder Mother   . Parkinson's disease Father   . Congestive Heart Failure Father   . Pneumonia Father   . Arthritis Father   . Dementia Father   . Depression Father   . Asthma Sister   . Depression Sister   . Bipolar disorder Brother   . Asthma Daughter   . Colon cancer Neg Hx   . Esophageal cancer Neg Hx   . Stomach cancer Neg Hx   . Rectal cancer Neg Hx     Medications: Patient's Medications  New Prescriptions   No medications on file  Previous Medications   ACETAMINOPHEN (TYLENOL) 500 MG TABLET    Take 1-2 tablets (500-1,000 mg total) by mouth every 8 (eight) hours as needed (Max dose 3000 mg in 24 hours).   AZELASTINE-FLUTICASONE 137-50 MCG/ACT SUSP    ONE SPRAY EACH NOSTRIL TWICE A DAY AS NEEDED   BACLOFEN (LIORESAL) 10 MG TABLET    Take 10 mg by mouth every 28 (twenty-eight) days.   BUPRENORPHINE HCL-NALOXONE HCL 4-1 MG FILM    Place 1 strip under the tongue 2 (two) times daily.   CALCIUM CITRATE 200 MG TABS    Take 2 tablets by mouth daily.   CYCLOSPORINE (RESTASIS) 0.05 % OPHTHALMIC EMULSION    Place 1 drop into both eyes 2 (two) times daily.   DEXLANSOPRAZOLE (DEXILANT) 60 MG CAPSULE    Take 1 capsule (60 mg total) by mouth daily. Crack open the capsule and pour into spoon prior to ingestion   ERENUMAB-AOOE (AIMOVIG, 140 MG DOSE,) 70 MG/ML SOAJ    Inject into the skin every 30 (thirty) days.   ESTRADIOL (ESTRACE) 0.1 MG/GM VAGINAL CREAM    3  (three) times a week.   FERROUS SULFATE 325 (65 FE) MG TABLET    Take 325 mg by mouth 2 (two) times daily with a meal.   HYDROXYZINE (VISTARIL) 25 MG CAPSULE    TAKE ONE CAPSULE BY MOUTH TWICE DAILY AS NEEDED   IBUPROFEN (ADVIL) 200 MG TABLET    Take 200 mg by mouth every 6 (six) hours as needed.   IMMUNE GLOBULIN, HUMAN, (GAMMAGARD S/D LESS IGA)  10 G INJECTION    Inject into the vein every 21 ( twenty-one) days.    LAMOTRIGINE (LAMICTAL) 200 MG TABLET    Take 1 tablet (200 mg total) by mouth daily.   LASTACAFT 0.25 % SOLN    Apply 1 drop to eye daily.   LEVOTHYROXINE (SYNTHROID) 75 MCG TABLET    Take one tablet by mouth once daily 30 minutes before breakfast on empty stomach.   MAGNESIUM OXIDE (MAG-OX) 400 MG TABLET    Take 400 mg by mouth daily.    MIRTAZAPINE (REMERON) 7.5 MG TABLET    Take 1 tablet (7.5 mg total) by mouth at bedtime.   POTASSIUM CHLORIDE SA (KLOR-CON) 20 MEQ TABLET    TAKE TWO TABLETS BY MOUTH DAILY (MAY DISSOLVE IN WATER)   ROPINIROLE (REQUIP) 1 MG TABLET    TAKE 1 TABLET BY MOUTH AT BEDTIME   SUCRALFATE (CARAFATE) 1 GM/10ML SUSPENSION    Take 2 tsp by mouth three times daily as needed.   TORSEMIDE (DEMADEX) 20 MG TABLET    TAKE 1 TABLET BY MOUTH DAILY   TRIAMCINOLONE (KENALOG) 0.1 %    Apply 1 application topically 2 (two) times daily as needed.   VALBENAZINE TOSYLATE (INGREZZA) 80 MG CAPS    Take 80 mg by mouth at bedtime.  Modified Medications   No medications on file  Discontinued Medications   AZELASTINE (ASTELIN) 0.1 % NASAL SPRAY    SMARTSIG:1 Spray(s) Both Nares 1 to 2 Times Daily PRN   TIZANIDINE (ZANAFLEX) 2 MG TABLET    Take 1 tablet (2 mg total) by mouth 3 (three) times daily as needed for muscle spasms.   TRIAMCINOLONE (KENALOG) 0.1 %    Apply 1 application topically 2 (two) times daily.   UNABLE TO FIND    Quinoa   VALBENAZINE TOSYLATE (INGREZZA) 40 & 80 MG CPPK    Take 40 mg by mouth at bedtime for 7 days, THEN 80 mg at bedtime for 21 days.    Physical  Exam:  Vitals:   11/18/20 1449  BP: 106/68  Pulse: 76  Temp: (!) 96.9 F (36.1 C)  TempSrc: Temporal  SpO2: 99%  Weight: 117 lb (53.1 kg)  Height: 5' 2.5" (1.588 m)   Body mass index is 21.06 kg/m. Wt Readings from Last 3 Encounters:  11/18/20 117 lb (53.1 kg)  08/28/20 130 lb (59 kg)  08/05/20 132 lb 6.4 oz (60.1 kg)    Physical Exam Constitutional:      General: She is not in acute distress.    Appearance: She is well-developed. She is not diaphoretic.  HENT:     Head: Normocephalic and atraumatic.     Mouth/Throat:     Pharynx: No oropharyngeal exudate.  Eyes:     Conjunctiva/sclera: Conjunctivae normal.     Pupils: Pupils are equal, round, and reactive to light.  Cardiovascular:     Rate and Rhythm: Normal rate and regular rhythm.     Heart sounds: Normal heart sounds.  Pulmonary:     Effort: Pulmonary effort is normal.     Breath sounds: Normal breath sounds.  Abdominal:     General: Bowel sounds are normal.     Palpations: Abdomen is soft.  Musculoskeletal:        General: No tenderness.     Cervical back: Normal range of motion and neck supple.  Skin:    General: Skin is warm and dry.  Neurological:     Mental Status: She is  alert and oriented to person, place, and time. Mental status is at baseline.     Motor: Weakness present.     Gait: Gait abnormal (slow).  Psychiatric:        Mood and Affect: Mood normal.        Behavior: Behavior normal.     Labs reviewed: Basic Metabolic Panel: Recent Labs    12/08/19 1541 06/10/20 1545 06/10/20 1549 08/28/20 1422 08/28/20 1426  NA 139  --  136  --  134*  K 5.1  --  4.9  --  2.7*  CL 108  --  101  --  92*  CO2 22  --  23  --  29  GLUCOSE 91  --  92  --  119*  BUN 28*  --  31*  --  13  CREATININE 1.21*  --  1.57*  --  0.89  CALCIUM 8.9  --  9.1  --  9.4  MG  --   --   --  1.3*  --   TSH 2.04 5.05*  --   --   --    Liver Function Tests: Recent Labs    06/10/20 1549 08/28/20 1426  AST 29 41   ALT 19 25  ALKPHOS  --  45  BILITOT 0.2 0.5  PROT 7.0 7.5  ALBUMIN  --  4.2   Recent Labs    08/28/20 1426  LIPASE 46   No results for input(s): AMMONIA in the last 8760 hours. CBC: Recent Labs    06/10/20 1549 08/28/20 1426  WBC 8.7 8.4  NEUTROABS 6,012 6.5  HGB 14.8 15.5*  HCT 45.1* 44.4  MCV 89.5 88.1  PLT 257 240   Lipid Panel: No results for input(s): CHOL, HDL, LDLCALC, TRIG, CHOLHDL, LDLDIRECT in the last 8760 hours. TSH: Recent Labs    12/08/19 1541 06/10/20 1545  TSH 2.04 5.05*   A1C: No results found for: HGBA1C   Assessment/Plan 1. 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 No changes in treatment.   2. Bipolar disorder, current episode mixed, moderate (HCC) Stable at this time. Followed by psych.   3. Tardive dyskinesia Recently was added Ingrezza for symptom relief. Since the titration to full dose when she takes medication at night she is having weakness to bilateral LE with falls. She reports medication has not been helping and still symptomatic. Discussed calling psychiatrist to let them know of these effects likely will need titration off medication    4. Presbycusis of both ears -canals clear. - Ambulatory referral to Audiology  Next appt: 12/09/2020 as scheduled. Sooner if needed [employer]. [place], [facility] [phone]

## 2020-11-18 NOTE — Telephone Encounter (Signed)
Pt reports she has been on Ingrezza 80 mg for a few weeks and feels like it's not helping her. Reports she didn't feel like this on the 40 mg. She reports having nausea/vomiting 10-15 minutes after taking for the past 3 nights and has also had some falls. She was at her doctor for another reason and they advised her to stop until speaking with [name] about this. Feels she still is shaking all over. Informed her I would let [name] know and follow up with her.

## 2020-11-18 NOTE — Patient Instructions (Signed)
Call psych office about ingrezza and side effects you are experiencing, recommend reducing dose or coming off of medication.

## 2020-11-18 NOTE — Telephone Encounter (Signed)
Megan Brewer called in today stating that she just left the doctors office, and was told to stop taking the Ingrezza 80mg  due to side effects. She states she has been falling and throwing up. Pls CB [phone] [phone]

## 2020-11-19 NOTE — Telephone Encounter (Signed)
Rtc to pt and she did not taking a dose last night and feels much better, she also reports her shaking seems better. Advised her to hold it for now and I would follow up with her on Friday to check her symptoms. She was appreciative of call.

## 2020-11-22 MED ORDER — VALBENAZINE TOSYLATE 40 MG PO CAPS: 40.0000 mg | ORAL_CAPSULE | Freq: Every day | ORAL | 2 refills | Status: DC

## 2020-11-22 NOTE — Telephone Encounter (Signed)
F/u call made to pt, she reports feeling much better and her symptoms have gone away. She also reports her shaking is not as intense. She does report while taking Ingrezza 40 mg she did not have any side effects but it greatly helped her shaking. Informed her I would check with [name] on restarting the 40 mg Ingrezza. Pt only has 80 mg at home.

## 2020-11-22 NOTE — Telephone Encounter (Signed)
Ok to retrial Ingrezza 40 mg po QHS, however recommend that she stop Ingrezza if side effects return. Script for 40 mg has been sent to [facility].

## 2020-11-24 ENCOUNTER — Emergency Department (HOSPITAL_BASED_OUTPATIENT_CLINIC_OR_DEPARTMENT_OTHER): Payer: Medicare PPO

## 2020-11-24 ENCOUNTER — Emergency Department (HOSPITAL_BASED_OUTPATIENT_CLINIC_OR_DEPARTMENT_OTHER)
Admission: EM | Admit: 2020-11-24 | Discharge: 2020-11-24 | Disposition: A | Discharge status: Home / Self Care | Payer: Medicare PPO | Attending: Emergency Medicine | Admitting: Emergency Medicine

## 2020-11-24 ENCOUNTER — Other Ambulatory Visit: Payer: Self-pay

## 2020-11-24 ENCOUNTER — Encounter (HOSPITAL_BASED_OUTPATIENT_CLINIC_OR_DEPARTMENT_OTHER): Payer: Self-pay | Admitting: Emergency Medicine

## 2020-11-24 DIAGNOSIS — E039 Hypothyroidism, unspecified: Secondary | ICD-10-CM | POA: Diagnosis not present

## 2020-11-24 DIAGNOSIS — M62838 Other muscle spasm: Secondary | ICD-10-CM

## 2020-11-24 DIAGNOSIS — M546 Pain in thoracic spine: Secondary | ICD-10-CM | POA: Diagnosis not present

## 2020-11-24 DIAGNOSIS — Z85118 Personal history of other malignant neoplasm of bronchus and lung: Secondary | ICD-10-CM | POA: Insufficient documentation

## 2020-11-24 DIAGNOSIS — N189 Chronic kidney disease, unspecified: Secondary | ICD-10-CM | POA: Insufficient documentation

## 2020-11-24 DIAGNOSIS — M542 Cervicalgia: Secondary | ICD-10-CM | POA: Diagnosis present

## 2020-11-24 DIAGNOSIS — G8929 Other chronic pain: Secondary | ICD-10-CM | POA: Diagnosis not present

## 2020-11-24 LAB — CBC WITH DIFFERENTIAL/PLATELET
Abs Immature Granulocytes: 0.01 10*3/uL (ref 0.00–0.07)
Basophils Absolute: 0.1 10*3/uL (ref 0.0–0.1)
Basophils Relative: 1 %
Eosinophils Absolute: 0.1 10*3/uL (ref 0.0–0.5)
Eosinophils Relative: 2 %
HCT: 36.6 % (ref 36.0–46.0)
Hemoglobin: 12.4 g/dL (ref 12.0–15.0)
Immature Granulocytes: 0 %
Lymphocytes Relative: 24 %
Lymphs Abs: 1.5 10*3/uL (ref 0.7–4.0)
MCH: 30.4 pg (ref 26.0–34.0)
MCHC: 33.9 g/dL (ref 30.0–36.0)
MCV: 89.7 fL (ref 80.0–100.0)
Monocytes Absolute: 0.5 10*3/uL (ref 0.1–1.0)
Monocytes Relative: 9 %
Neutro Abs: 3.9 10*3/uL (ref 1.7–7.7)
Neutrophils Relative %: 64 %
Platelets: 218 10*3/uL (ref 150–400)
RBC: 4.08 MIL/uL (ref 3.87–5.11)
RDW: 12.3 % (ref 11.5–15.5)
WBC: 6.1 10*3/uL (ref 4.0–10.5)
nRBC: 0 % (ref 0.0–0.2)

## 2020-11-24 LAB — BASIC METABOLIC PANEL
Anion gap: 6 (ref 5–15)
BUN: 28 mg/dL — ABNORMAL HIGH (ref 8–23)
CO2: 25 mmol/L (ref 22–32)
Calcium: 8.1 mg/dL — ABNORMAL LOW (ref 8.9–10.3)
Chloride: 104 mmol/L (ref 98–111)
Creatinine, Ser: 0.91 mg/dL (ref 0.44–1.00)
GFR, Estimated: >60 mL/min (ref >60)
Glucose, Bld: 122 mg/dL — ABNORMAL HIGH (ref 70–99)
Potassium: 4.3 mmol/L (ref 3.5–5.1)
Sodium: 135 mmol/L (ref 135–145)

## 2020-11-24 MED ORDER — SODIUM CHLORIDE 0.9 % IV BOLUS
1000.0000 mL | Freq: Once | INTRAVENOUS | Status: AC
  Administered 2020-11-24: 1000 mL via INTRAVENOUS

## 2020-11-24 MED ORDER — DIAZEPAM 5 MG/ML IJ SOLN: 2.5000 mg | Freq: Once | INTRAMUSCULAR | Status: DC

## 2020-11-24 MED ORDER — DIAZEPAM 5 MG/ML IJ SOLN
5.0000 mg | Freq: Once | INTRAMUSCULAR | Status: AC
  Administered 2020-11-24: 5 mg via INTRAVENOUS
  Filled 2020-11-24: qty 2

## 2020-11-24 MED ORDER — TIZANIDINE HCL 4 MG PO TABS: 4.0000 mg | ORAL_TABLET | Freq: Four times a day (QID) | ORAL | 0 refills | Status: DC | PRN

## 2020-11-24 MED ORDER — MORPHINE SULFATE (PF) 4 MG/ML IV SOLN
4.0000 mg | Freq: Once | INTRAVENOUS | Status: AC
  Administered 2020-11-24: 4 mg via INTRAVENOUS
  Filled 2020-11-24: qty 1

## 2020-11-24 MED ORDER — LIDOCAINE 5 % EX PTCH: 1.0000 | MEDICATED_PATCH | CUTANEOUS | 0 refills | Status: DC

## 2020-11-24 MED ORDER — HEPARIN SOD (PORK) LOCK FLUSH 100 UNIT/ML IV SOLN
500.0000 [IU] | Freq: Once | INTRAVENOUS | Status: AC
  Administered 2020-11-24: 500 [IU]
  Filled 2020-11-24: qty 5

## 2020-11-24 NOTE — Discharge Instructions (Signed)
Your CT scans did not show any fractures.  You had some stable bone lesions that was there previously and has remained unchanged  Please continue your buprenorphine as prescribed by your doctor.  You may try some tizanidine for muscle spasms.  You can also try lidocaine patch as well  See your pain management doctor  Return to ER if you have worse neck pain or back pain or muscle spasms or numbness or weakness.

## 2020-11-24 NOTE — ED Notes (Signed)
Pt transported to CT with RN and returned at this time

## 2020-11-24 NOTE — ED Triage Notes (Signed)
Reports she fell on Monday due to side effects ingreza.  Reports this is a new medication.  Now c/o neck pain.  Reports that started two days ago.  Taking ibuprofen.  Last dose 1200.

## 2020-11-24 NOTE — ED Notes (Signed)
ED Provider at bedside. Dr. Darl Householder

## 2020-11-24 NOTE — ED Provider Notes (Signed)
[facility]PART[facility] Provider Note   CSN: [id number] Arrival date & time: 11/24/20  1554     History Chief Complaint  Patient presents with  . Fall    Megan Brewer is a 75 y.o. female history of CKD, lung cancer, bipolar here presenting with back pain.  She does have history of chronic back pain is currently on buprenorphine.  Patient states that she had tardive dyskinesia from [place] and had a fall 6 days ago.  She states that it was a very minor fall and she did not hurt right away.  She is started having right-sided neck pain and upper back pain for the last 3 days.  She states that is very severe and associated with muscle spasms.  Patient denies any chest pain or radiation of the pain to her arms.  Denies any arm numbness or tingling.  Denies any headaches.  The history is provided by the patient.       Past Medical History:  Diagnosis Date  . Allergy   . Anemia   . Arachnoiditis   . Bipolar disorder ([place])   . Blood transfusion without reported diagnosis   . Cataract    removed  . Chronic back pain   . Chronic pain   . Chronic post-thoracotomy pain   . CKD (chronic kidney disease)   . GERD (gastroesophageal reflux disease)   . Hypogammaglobulinemia ([place])   . Hypotension   . Hypothyroid   . 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 Lung cancer ([place]) 2011, 2014  . Memory loss   . Migraine   . Opioid abuse ([place])   . Osteoporosis   . Presence of neurostimulator   . Recurrent upper respiratory infection (URI)   . Restless legs   . Sleep apnea   . Tremor   . Urge incontinence   . Urticaria    long time ago, nothing recent    Patient Active Problem List   Diagnosis Date Noted  . Chest discomfort 03/22/2019  . Weight loss 01/27/2019  . Acquired hypothyroidism 01/27/2019  . Esophagitis 01/27/2019  . Intractable migraine without status migrainosus 01/27/2019  . Osteoporosis 01/27/2019  . Chronic sinusitis 12/28/2018  . Food  intolerance/GI symptoms 12/28/2018  . Acute maxillary sinusitis 12/28/2018  . Lower leg edema 12/20/2018  . Chronic pain of both shoulders 05/24/2018  . Bilateral leg edema 05/24/2018  . Chronic pain syndrome 03/31/2018  . Left bundle branch block 10/28/2017  . Palpitations 10/28/2017  . History of gastric bypass 05/21/2017  . Polypharmacy 02/11/2017  . Senile nuclear sclerosis 11/26/2016  . Chronic post-thoracotomy pain 04/09/2014  . Chronic kidney disease 04/09/2014  . Sleep disturbances 09/20/2012  . Restless legs syndrome 07/07/2012  . Hypertonicity of bladder 05/23/2012  . Essential tremor 04/01/2012  . Memory loss 04/01/2012  . Incomplete emptying of bladder 01/14/2012  . Urge incontinence 10/28/2011  . Tension type headache 06/10/2011  . Mixed urge and stress incontinence 05/28/2011  . Hypogammaglobulinemia ([place]) 06/22/2010  . Severe episode of recurrent major depressive disorder, without psychotic features ([place]) 06/22/2010  . Hypothyroidism 02/12/2010  . 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 ARACHNOIDITIS 01/31/2010  . OBSTRUCTIVE SLEEP APNEA 01/31/2010  . Chronic rhinitis 01/31/2010  . Arachnoiditis 01/31/2010    Past Surgical History:  Procedure Laterality Date  . ABDOMINAL HYSTERECTOMY    . CARPAL TUNNEL RELEASE    . CATARACT EXTRACTION Bilateral 2019  . CHOLECYSTECTOMY    . COLONOSCOPY  2015   [facility]  .  CT LUNG SCREENING  2018  . DG  BONE DENSITY ([place])  2018  . DIAGNOSTIC MAMMOGRAM  2019  . GASTRIC BYPASS    . LUNG CANCER SURGERY  02/2010  . MULTIPLE TOOTH EXTRACTIONS    . SPINAL CORD STIMULATOR IMPLANT       OB History   No obstetric history on file.     Family History  Problem Relation Age of Onset  . Hypertension Mother   . GER disease Mother   . Dementia Mother   . Osteoporosis Mother   . Arthritis Mother   . Bipolar disorder Mother   . Parkinson's disease Father   . Congestive Heart Failure Father   . Pneumonia Father   .  Arthritis Father   . Dementia Father   . Depression Father   . Asthma Sister   . Depression Sister   . Bipolar disorder Brother   . Asthma Daughter   . Colon cancer Neg Hx   . Esophageal cancer Neg Hx   . Stomach cancer Neg Hx   . Rectal cancer Neg Hx     Social History   Tobacco Use  . Smoking status: Never Smoker  . Smokeless tobacco: Never Used  Vaping Use  . Vaping Use: Never used  Substance Use Topics  . Alcohol use: Not Currently  . Drug use: Never    Home Medications Prior to Admission medications   Medication Sig Start Date End Date Taking? Authorizing Provider  acetaminophen (TYLENOL) 500 MG tablet Take 1-2 tablets (500-1,000 mg total) by mouth every 8 (eight) hours as needed (Max dose 3000 mg in 24 hours). 06/11/20   [name], NP  Azelastine-Fluticasone 137-50 MCG/ACT SUSP ONE SPRAY EACH NOSTRIL TWICE A DAY AS NEEDED 10/01/20   Ambs, Kathrine Cords, FNP  baclofen (LIORESAL) 10 MG tablet Take 10 mg by mouth every 28 (twenty-eight) days. 06/03/20   [provider]  Buprenorphine HCl-Naloxone HCl 4-1 MG FILM Place 1 strip under the tongue 2 (two) times daily. 10/31/19   [provider]  Calcium Citrate 200 MG TABS Take 2 tablets by mouth daily. 12/02/16   [provider]  cycloSPORINE (RESTASIS) 0.05 % ophthalmic emulsion Place 1 drop into both eyes 2 (two) times daily.    [provider]  dexlansoprazole (DEXILANT) 60 MG capsule Take 1 capsule (60 mg total) by mouth daily. Crack open the capsule and pour into spoon prior to ingestion 06/21/20   Armbruster, Carlota Raspberry, MD  Erenumab-aooe (AIMOVIG, 140 MG DOSE,) 70 MG/ML SOAJ Inject into the skin every 30 (thirty) days.    [provider]  estradiol (ESTRACE) 0.1 MG/GM vaginal cream 3 (three) times a week. 01/12/20   [provider]  ferrous sulfate 325 (65 FE) MG tablet Take 325 mg by mouth 2 (two) times daily with a meal.    [provider]  hydrOXYzine (VISTARIL)  25 MG capsule TAKE ONE CAPSULE BY MOUTH TWICE DAILY AS NEEDED 11/07/20   Thayer Headings, PMHNP  ibuprofen (ADVIL) 200 MG tablet Take 200 mg by mouth every 6 (six) hours as needed.    [provider]  immune globulin, human, (GAMMAGARD S/D LESS IGA) 10 g injection Inject into the vein every 21 ( twenty-one) days.     [provider]  lamoTRIgine (LAMICTAL) 200 MG tablet Take 1 tablet (200 mg total) by mouth daily. 09/16/20   Thayer Headings, PMHNP  LASTACAFT 0.25 % SOLN Apply 1 drop to eye daily. 01/19/19  [provider]  levothyroxine (SYNTHROID) 75 MCG tablet Take one tablet by mouth once daily 30 minutes before breakfast on empty stomach. 08/21/20   [name], NP  magnesium oxide (MAG-OX) 400 MG tablet Take 400 mg by mouth daily.     [provider]  mirtazapine (REMERON) 7.5 MG tablet Take 1 tablet (7.5 mg total) by mouth at bedtime. 09/16/20 10/16/20  Thayer Headings, PMHNP  potassium chloride SA (KLOR-CON) 20 MEQ tablet TAKE TWO TABLETS BY MOUTH DAILY (MAY DISSOLVE IN WATER) 06/11/20   [name], NP  rOPINIRole (REQUIP) 1 MG tablet TAKE 1 TABLET BY MOUTH AT BEDTIME 09/09/20   Patel, Donika K, DO  sucralfate (CARAFATE) 1 GM/10ML suspension Take 2 tsp by mouth three times daily as needed. 08/07/20   Ngetich, Dinah C, NP  torsemide (DEMADEX) 20 MG tablet TAKE 1 TABLET BY MOUTH DAILY 07/23/20   [name], NP  triamcinolone (KENALOG) 0.1 % Apply 1 application topically 2 (two) times daily as needed.    [provider]  valbenazine (INGREZZA) 40 MG capsule Take 1 capsule (40 mg total) by mouth at bedtime. 11/22/20 12/22/20  Thayer Headings, PMHNP    Allergies    Tetracycline, Corticosteroids, Cefixime, Ciprofloxacin, Doxycycline, Gabapentin, Isometheptene-dichloral-apap, Ketorolac, Prednisone, Promethazine, Stadol [butorphanol], Erythromycin base, and Propoxyphene  Review of Systems   Review of Systems  Musculoskeletal: Positive for back  pain and neck pain.  All other systems reviewed and are negative.   Physical Exam Updated Vital Signs BP (!) 102/49   Pulse 63   Temp 98.2 F (36.8 C) (Oral)   Resp 11   Ht 5' 3.5" (1.613 m)   Wt 52.2 kg   SpO2 100%   BMI 20.05 kg/m   Physical Exam Vitals and nursing note reviewed.  Constitutional:      Comments: Uncomfortable  HENT:     Head: Normocephalic.     Nose: Nose normal.     Mouth/Throat:     Mouth: Mucous membranes are moist.  Eyes:     Extraocular Movements: Extraocular movements intact.     Pupils: Pupils are equal, round, and reactive to light.  Neck:     Comments: Right para cervical tenderness Cardiovascular:     Rate and Rhythm: Normal rate and regular rhythm.     Pulses: Normal pulses.     Heart sounds: Normal heart sounds.  Pulmonary:     Effort: Pulmonary effort is normal.     Breath sounds: Normal breath sounds.  Abdominal:     General: Abdomen is flat.     Palpations: Abdomen is soft.  Musculoskeletal:     Comments: Right paracervical and right parathoracic tenderness.  There is some muscle spasms of the right trapezius  Skin:    General: Skin is warm.     Capillary Refill: Capillary refill takes less than 2 seconds.  Neurological:     General: No focal deficit present.     Comments: Patient has no obvious saddle anesthesia.  Patient has normal hand grasp bilaterally.  Normal sensation and motor strength bilateral arms and legs  Psychiatric:        Mood and Affect: Mood normal.        Behavior: Behavior normal.     ED Results / Procedures / Treatments   Labs (all labs ordered are listed, but only abnormal results are displayed) Labs Reviewed  BASIC METABOLIC PANEL - Abnormal; Notable for the following components:      Result Value  Glucose, Bld 122 (*)    BUN 28 (*)    Calcium 8.1 (*)    All other components within normal limits  CBC WITH DIFFERENTIAL/PLATELET    EKG None  Radiology CT Cervical Spine Wo Contrast  Result  Date: 11/24/2020 CLINICAL DATA:  Fall 1 week ago. Neck pain. History of lung cancer. EXAM: CT CERVICAL SPINE WITHOUT CONTRAST TECHNIQUE: Multidetector CT imaging of the cervical spine was performed without intravenous contrast. Multiplanar CT image reconstructions were also generated. COMPARISON:  CT cervical spine 05/29/2017 FINDINGS: Alignment: Normal Skull base and vertebrae: Negative for fracture in the cervical spine. 5 mm lucency in the C2 vertebral body on the left unchanged from the prior CT. 5 mm lucency in the superior C6 vertebral body also unchanged from the prior study. These are ill-defined and nonspecific in appearance but appear stable. Soft tissues and spinal canal: Negative Disc levels: Mild disc and facet degeneration. Mild left foraminal narrowing C4-5 and C5-6 due to spurring. Upper chest: Lung apices clear bilaterally. Other: None IMPRESSION: Negative for cervical fracture Lucent lesions in the C2 and C6 vertebral bodies unchanged from 2018 CT. Electronically Signed   By: Franchot Gallo M.D.   On: 11/24/2020 18:11   CT Thoracic Spine Wo Contrast  Result Date: 11/24/2020 CLINICAL DATA:  Fall 1 week ago.  Back pain EXAM: CT THORACIC SPINE WITHOUT CONTRAST TECHNIQUE: Multidetector CT images of the thoracic were obtained using the standard protocol without intravenous contrast. COMPARISON:  CT chest 05/18/2019 FINDINGS: Alignment: Normal alignment.  Mild thoracic kyphosis. Vertebrae: Negative for fracture. Paraspinal and other soft tissues: 2 spinal cord stimulators are in place. The upper stimulator leads extends from T3 through T5. The lower stimulator leads are from T7 through T9 in the dorsal canal. Right posterior pleural thickening and calcification. Both lung both lungs are clear without infiltrate. Left-sided Port-A-Cath tip in the SVC. Disc levels: Normal disc spaces. No significant disc degeneration or stenosis in the thoracic spine. IMPRESSION: Negative for thoracic fracture.  Electronically Signed   By: Franchot Gallo M.D.   On: 11/24/2020 18:06    Procedures Procedures   Medications Ordered in ED Medications  morphine 4 MG/ML injection 4 mg (4 mg Intravenous Given 11/24/20 1721)  sodium chloride 0.9 % bolus 1,000 mL ( Intravenous Stopped 11/24/20 1810)  diazepam (VALIUM) injection 5 mg (5 mg Intravenous Given 11/24/20 1721)    ED Course  I have reviewed the triage vital signs and the nursing notes.  Pertinent labs & imaging results that were available during my care of the patient were reviewed by me and considered in my medical decision making (see chart for details).    MDM Rules/Calculators/A&P                         Megan Brewer is a 75 y.o. female who presenting with right-sided neck and upper back pain after fall.  Consider compression fracture versus muscle spasms.  The fall has been about a week ago but the pain has been going on for about 3 days.  Patient has no neuro deficits.  Patient is already on buprenorphine.  Plan to give some pain medicine and Valium.  6:40 PM CT showed lucent lesions C2, C6 that is unchanged but otherwise no fracture.  Patient's pain improved with pain medicine and Valium.  I was able to look up her chart and she is actually on buprenorphine post lung cancer.  I told her that I  cannot prescribe any pain medicine.  She requested that I prescribe some muscle relaxant and states that her pain doctor is okay with that. Will also prescribe lidocaine patch    Final Clinical Impression(s) / ED Diagnoses Final diagnoses:  None    Rx / DC Orders ED Discharge Orders    None       Drenda Freeze, MD 11/24/20 [phone]

## 2020-11-24 NOTE — ED Notes (Signed)
Attempted to retrieve pt for CT; pt having port accessed and then being given pain meds prior to imaging;   CT will attempt to get pt around 1715, unless nursing calls before.

## 2020-11-25 ENCOUNTER — Other Ambulatory Visit: Payer: Self-pay | Admitting: Psychiatry

## 2020-11-25 DIAGNOSIS — F419 Anxiety disorder, unspecified: Secondary | ICD-10-CM

## 2020-11-25 DIAGNOSIS — F5105 Insomnia due to other mental disorder: Secondary | ICD-10-CM

## 2020-11-26 NOTE — Telephone Encounter (Signed)
Called to f/u with pt on how Ingrezza 40 mg was going and she reports she started back on it and ended up falling on Saturday. She hurt her neck and went to the ER, nothing serious but she has muscle relaxer's for it. She also reports she vomited again with that dose. She has not taken any since and feels better, other then her neck. She is asking what she can take now for her shaking? She is very disappointed she could not stay on the 40 mg. Informed her I would update Jessica and f/up. She was appreciative of my f/u call.

## 2020-11-26 NOTE — Telephone Encounter (Signed)
Please schedule apt and place on cancellation list.

## 2020-12-03 ENCOUNTER — Ambulatory Visit: Payer: Medicare PPO | Attending: Internal Medicine

## 2020-12-03 ENCOUNTER — Ambulatory Visit: Payer: Self-pay

## 2020-12-03 DIAGNOSIS — Z23 Encounter for immunization: Secondary | ICD-10-CM

## 2020-12-03 NOTE — Progress Notes (Signed)
   Covid-19 Vaccination Clinic  Name:  Kyriaki Moder    MRN: [id number] DOB: [birth year]  12/03/2020  Ms. Seliga was observed post Covid-19 immunization for 15 minutes without incident. She was provided with Vaccine Information Sheet and instruction to access the V-Safe system.   Ms. Ales was instructed to call 911 with any severe reactions post vaccine: Marland Kitchen Difficulty breathing  . Swelling of face and throat  . A fast heartbeat  . A bad rash all over body  . Dizziness and weakness   Immunizations Administered    Name Date Dose VIS Date Route   PFIZER Comrnaty(Gray TOP) Covid-19 Vaccine 12/03/2020 11:34 AM 0.3 mL 07/25/2020 Intramuscular   Manufacturer: [place]   Lot: [id number]   NDC: [phone]

## 2020-12-06 ENCOUNTER — Encounter: Payer: Self-pay | Admitting: Family

## 2020-12-06 ENCOUNTER — Other Ambulatory Visit (HOSPITAL_BASED_OUTPATIENT_CLINIC_OR_DEPARTMENT_OTHER): Payer: Self-pay

## 2020-12-06 ENCOUNTER — Ambulatory Visit (INDEPENDENT_AMBULATORY_CARE_PROVIDER_SITE_OTHER): Payer: Medicare PPO | Admitting: Family

## 2020-12-06 ENCOUNTER — Other Ambulatory Visit: Payer: Self-pay

## 2020-12-06 VITALS — BP 138/78 | HR 79 | Temp 97.3°F | Resp 16 | Ht 63.5 in | Wt 118.0 lb

## 2020-12-06 DIAGNOSIS — L8941 Pressure ulcer of contiguous site of back, buttock and hip, stage 1: Secondary | ICD-10-CM | POA: Diagnosis not present

## 2020-12-06 MED ORDER — PFIZER-BIONT COVID-19 VAC-TRIS 30 MCG/0.3ML IM SUSP
INTRAMUSCULAR | 0 refills | Status: DC
  Filled 2020-12-06: qty 0.3, 1d supply, fill #0

## 2020-12-06 MED ORDER — ZINC OXIDE 10 % EX CREA: 1.0000 "application " | TOPICAL_CREAM | Freq: Two times a day (BID) | CUTANEOUS | 3 refills | Status: DC

## 2020-12-06 NOTE — Patient Instructions (Signed)
-   Avoid prolong sitting change position  frequently to prevent further skin breakdown  - cleanse pressure ulcer area on buttock and sacral area well with dial soap and warm water,pat dry then apply a generous amount of Balmex cream twice daily and as needed.Avoid wiping area too hard.   - sit on a gel cushion or donut cushion to relief pressure.  - Notify provider for any redness,drainage or ulcer area opens up

## 2020-12-06 NOTE — Progress Notes (Signed)
Provider: Stuart Guillen FNP-C  [name], NP  Patient Care Team: [name], NP as PCP - General (Geriatric Medicine) Alda Berthold, DO as Consulting Physician (Neurology)  Extended Emergency Contact Information Primary Emergency Contact: [facility] Address: [address] [place] Phone: [phone] Mobile Phone: [phone] Relation: Spouse  Code Status:Full Code  Goals of care: Advanced Directive information Advanced Directives 12/06/2020  Does Patient Have a Medical Advance Directive? No  Type of Advance Directive -  Does patient want to make changes to medical advance directive? No - Patient declined  Copy of [place] in Chart? -  Would patient like information on creating a medical advance directive? -     Chief Complaint  Patient presents with  . Acute Visit    Buttocks hurting, burning. Patient states there is no rash or feeling in area.  . Fall Risk     Moderate Fall Risk    HPI:  Pt is a 75 y.o. female seen today for an acute visit for evaluation of buttocks hurting and burning of buttocks.States no rash or feeling on area. Feels like sitting on hot sand.Has used Aquaphor and changed detergent.Has used moist wipes.ongoing for a couple months. Waking her up at night. No fever,chills,itching or drainage.  She sits and sleeps on recliner.States Husband snores and tells her she snores too.    Past Medical History:  Diagnosis Date  . Allergy   . Anemia   . Arachnoiditis   . Bipolar disorder ([place])   . Blood transfusion without reported diagnosis   . Cataract    removed  . Chronic back pain   . Chronic pain   . Chronic post-thoracotomy pain   . CKD (chronic kidney disease)   . GERD (gastroesophageal reflux disease)   . Hypogammaglobulinemia ([place])   . Hypotension   . Hypothyroid   . 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 Lung cancer ([place]) 2011, 2014  . Memory  loss   . Migraine   . Opioid abuse ([place])   . Osteoporosis   . Presence of neurostimulator   . Recurrent upper respiratory infection (URI)   . Restless legs   . Sleep apnea   . Tremor   . Urge incontinence   . Urticaria    long time ago, nothing recent   Past Surgical History:  Procedure Laterality Date  . ABDOMINAL HYSTERECTOMY    . CARPAL TUNNEL RELEASE    . CATARACT EXTRACTION Bilateral 2019  . CHOLECYSTECTOMY    . COLONOSCOPY  2015   [facility]  . CT LUNG SCREENING  2018  . DG  BONE DENSITY ([place] HX)  2018  . DIAGNOSTIC MAMMOGRAM  2019  . GASTRIC BYPASS    . LUNG CANCER SURGERY  02/2010  . MULTIPLE TOOTH EXTRACTIONS    . SPINAL CORD STIMULATOR IMPLANT      Allergies  Allergen Reactions  . Tetracycline Nausea Only    Causes ringing in ears, dizziness, migraine, nausea  . Corticosteroids Other (See Comments)    12/02/2016 interview by Melissa Montane: Oral dosing causes migraines/nausea/tinnitus. Prev tolerated IV and intranasal admin w/o difficulty.  . Cefixime Other (See Comments)    Headache   . Ciprofloxacin Other (See Comments)    Headache, dizziness, ringing in the ears  . Doxycycline Other (See Comments)    unknown  . Gabapentin     Throat swells/closes  .  Isometheptene-Dichloral-Apap Other (See Comments)    unknown  . Ketorolac     12/02/2016 interview by AYT: Oral dosing prednisone/corticosteroids causes migraines/nausea/tinnitus. Prev tolerated IV and intranasal admin w/o difficulty.  . Prednisone Other (See Comments)    Migraine, dizzy, ringing in ears-can take it IV 12/02/2016 interview by [name]: Oral prednisone dosing causes migraines/nausea/tinnitus. Prev tolerated IV and intranasal admin w/o difficulty.  . Promethazine Other (See Comments)    causes severe abdominal pain when taken IV; however she can take PO or IM  . Stadol [Butorphanol]     Cerebral pain  . Erythromycin Base Diarrhea and Itching    Severe diarrhea  . Propoxyphene Nausea Only    Outpatient  Encounter Medications as of 12/06/2020  Medication Sig  . acetaminophen (TYLENOL) 500 MG tablet Take 1-2 tablets (500-1,000 mg total) by mouth every 8 (eight) hours as needed (Max dose 3000 mg in 24 hours).  . Azelastine-Fluticasone 137-50 MCG/ACT SUSP ONE SPRAY EACH NOSTRIL TWICE A DAY AS NEEDED  . baclofen (LIORESAL) 10 MG tablet Take 10 mg by mouth every 28 (twenty-eight) days.  . Buprenorphine HCl-Naloxone HCl 4-1 MG FILM Place 1 strip under the tongue 2 (two) times daily.  . Calcium Citrate 200 MG TABS Take 2 tablets by mouth daily.  Mar[place] Kitchen COVID-19 mRNA Vac-TriS, Pfizer, (PFIZER-BIONT COVID-19 VAC-TRIS) SUSP injection Inject into the muscle.  . cycloSPORINE (RESTASIS) 0.05 % ophthalmic emulsion Place 1 drop into both eyes 2 (two) times daily.  Marland Kitchen dexlansoprazole (DEXILANT) 60 MG capsule Take 1 capsule (60 mg total) by mouth daily. Crack open the capsule and pour into spoon prior to ingestion  . Erenumab-aooe (AIMOVIG, 140 MG DOSE,) 70 MG/ML SOAJ Inject into the skin every 30 (thirty) days.  Marland Kitchen estradiol (ESTRACE) 0.1 MG/GM vaginal cream 3 (three) times a week.  . ferrous sulfate 325 (65 FE) MG tablet Take 325 mg by mouth 2 (two) times daily with a meal.  . hydrOXYzine (VISTARIL) 25 MG capsule TAKE ONE CAPSULE BY MOUTH TWICE DAILY AS NEEDED  . ibuprofen (ADVIL) 200 MG tablet Take 200 mg by mouth every 6 (six) hours as needed.  . immune globulin, human, (GAMMAGARD S/D LESS IGA) 10 g injection Inject into the vein every 21 ( twenty-one) days.   Marland Kitchen lamoTRIgine (LAMICTAL) 200 MG tablet Take 1 tablet (200 mg total) by mouth daily.  Marland Kitchen LASTACAFT 0.25 % SOLN Apply 1 drop to eye daily.  Marland Kitchen levothyroxine (SYNTHROID) 75 MCG tablet Take one tablet by mouth once daily 30 minutes before breakfast on empty stomach.  . lidocaine (LIDODERM) 5 % Place 1 patch onto the skin daily. Remove & Discard patch within 12 hours or as directed by MD  . magnesium oxide (MAG-OX) 400 MG tablet Take 400 mg by mouth daily.   .  potassium chloride SA (KLOR-CON) 20 MEQ tablet TAKE TWO TABLETS BY MOUTH DAILY (MAY DISSOLVE IN WATER)  . rOPINIRole (REQUIP) 1 MG tablet TAKE 1 TABLET BY MOUTH AT BEDTIME  . sucralfate (CARAFATE) 1 GM/10ML suspension Take 2 tsp by mouth three times daily as needed.  Mar[place] Kitchen tiZANidine (ZANAFLEX) 4 MG tablet Take 1 tablet (4 mg total) by mouth every 6 (six) hours as needed for muscle spasms.  Marland Kitchen torsemide (DEMADEX) 20 MG tablet TAKE 1 TABLET BY MOUTH DAILY  . triamcinolone cream (KENALOG) 0.1 % Apply 1 application topically 2 (two) times daily as needed.  . [DISCONTINUED] mirtazapine (REMERON) 7.5 MG tablet Take 1 tablet (7.5 mg total) by mouth at bedtime.  No facility-administered encounter medications on file as of 12/06/2020.    Review of Systems  Constitutional: Negative for appetite change, chills, fatigue, fever and unexpected weight change.  Respiratory: Negative for cough, chest tightness, shortness of breath and wheezing.   Cardiovascular: Negative for chest pain, palpitations and leg swelling.  Gastrointestinal: Negative for abdominal distention, abdominal pain, blood in stool, constipation, diarrhea, nausea and vomiting.  Genitourinary: Negative for difficulty urinating, dysuria, flank pain and urgency.       Wears pull ups   Musculoskeletal: Negative for arthralgias, back pain, gait problem, joint swelling, myalgias, neck pain and neck stiffness.  Skin: Negative for color change, pallor and rash.       Burning painful area on buttock per HPI     Immunization History  Administered Date(s) Administered  . Fluad Quad(high Dose 65+) 06/06/2019, 05/13/2020  . Influenza, High Dose Seasonal PF 06/06/2019, 05/13/2020  . Influenza, Seasonal, Injecte, Preservative Fre 11/14/2012  . Influenza,inj,Quad PF,6+ Mos 05/24/2018  . Influenza-Unspecified 11/14/2012, 04/17/2016, 06/09/2017, 05/24/2018  . PFIZER Comirnaty(Gray Top)Covid-19 Tri-Sucrose Vaccine 09/07/2019, 09/28/2019, 03/30/2020,  12/03/2020  . PFIZER(Purple Top)SARS-COV-2 Vaccination 09/07/2019, 09/28/2019, 03/30/2020  . Pneumococcal Conjugate-13 07/29/2017  . Pneumococcal Polysaccharide-23 07/29/2017, 06/10/2020  . Td 05/30/2020  . Tdap 05/30/2017  . Zoster Recombinat (Shingrix) 06/17/2017   Pertinent  Health Maintenance Due  Topic Date Due  . MAMMOGRAM  02/12/2021  . INFLUENZA VACCINE  03/17/2021  . PNA vac Low Risk Adult (2 of 2 - PCV13) 06/10/2021  . COLONOSCOPY (Pts 45-29yrs Insurance coverage will need to be confirmed)  01/01/2027  . DEXA SCAN  Completed   Fall Risk  12/06/2020 11/18/2020 09/27/2020 08/05/2020 06/10/2020  Falls in the past year? 1 1 1 1  0  Number falls in past yr: 1 1 1 1  0  Injury with Fall? 0 0 0 0 0  Risk for fall due to : - History of fall(s);Impaired balance/gait - - -   Functional Status Survey:    Vitals:   12/06/20 1528  BP: 138/78  Pulse: 79  Resp: 16  Temp: (!) 97.3 F (36.3 C)  SpO2: 96%  Weight: 118 lb (53.5 kg)  Height: 5' 3.5" (1.613 m)   Body mass index is 20.57 kg/m. Physical Exam Vitals reviewed.  Constitutional:      General: She is not in acute distress.    Appearance: Normal appearance. She is normal weight. She is not ill-appearing or diaphoretic.  HENT:     Head: Normocephalic.     Mouth/Throat:     Pharynx: No oropharyngeal exudate.  Neck:     Vascular: No carotid bruit.  Cardiovascular:     Rate and Rhythm: Normal rate and regular rhythm.     Pulses: Normal pulses.     Heart sounds: Normal heart sounds. No murmur heard. No friction rub. No gallop.   Pulmonary:     Effort: Pulmonary effort is normal. No respiratory distress.     Breath sounds: Normal breath sounds. No wheezing, rhonchi or rales.  Chest:     Chest wall: No tenderness.  Abdominal:     General: Bowel sounds are normal. There is no distension.     Palpations: Abdomen is soft. There is no mass.     Tenderness: There is no abdominal tenderness. There is no right CVA tenderness,  left CVA tenderness, guarding or rebound.  Musculoskeletal:        General: No swelling or tenderness. Normal range of motion.     Cervical back: Normal  range of motion. No rigidity or tenderness.     Right lower leg: No edema.     Left lower leg: No edema.  Lymphadenopathy:     Cervical: No cervical adenopathy.  Skin:    General: Skin is warm and dry.     Coloration: Skin is not pale.     Findings: No bruising, lesion or rash.     Comments: Sacral area/bilateral gluteal area red-brownish area non-blanchable and tender to touch.  Neurological:     Mental Status: She is alert and oriented to person, place, and time.     Cranial Nerves: No cranial nerve deficit.     Sensory: No sensory deficit.     Motor: No weakness.     Coordination: Coordination normal.     Gait: Gait normal.  Psychiatric:        Mood and Affect: Mood normal.        Speech: Speech normal.        Behavior: Behavior normal.        Thought Content: Thought content normal.        Judgment: Judgment normal.     Labs reviewed: Recent Labs    06/10/20 1549 08/28/20 1422 08/28/20 1426 11/24/20 1706  NA 136  --  134* 135  K 4.9  --  2.7* 4.3  CL 101  --  92* 104  CO2 23  --  29 25  GLUCOSE 92  --  119* 122*  BUN 31*  --  13 28*  CREATININE 1.57*  --  0.89 0.91  CALCIUM 9.1  --  9.4 8.1*  MG  --  1.3*  --   --    Recent Labs    06/10/20 1549 08/28/20 1426  AST 29 41  ALT 19 25  ALKPHOS  --  45  BILITOT 0.2 0.5  PROT 7.0 7.5  ALBUMIN  --  4.2   Recent Labs    06/10/20 1549 08/28/20 1426 11/24/20 1706  WBC 8.7 8.4 6.1  NEUTROABS 6,012 6.5 3.9  HGB 14.8 15.5* 12.4  HCT 45.1* 44.4 36.6  MCV 89.5 88.1 89.7  PLT 257 240 218   Lab Results  Component Value Date   TSH 5.05 (H) 06/10/2020   No results found for: HGBA1C Lab Results  Component Value Date   CHOL  01/27/2010    144        ATP III CLASSIFICATION:  <200     mg/dL   Desirable  200-239  mg/dL   Borderline High  >=240    mg/dL    High          HDL 58 01/27/2010   LDLCALC  01/27/2010    72        Total Cholesterol/HDL:CHD Risk Coronary Heart Disease Risk Table                     Men   Women  1/2 Average Risk   3.4   3.3  Average Risk       5.0   4.4  2 X Average Risk   9.6   7.1  3 X Average Risk  23.4   11.0        Use the calculated Patient Ratio above and the CHD Risk Table to determine the patient's CHD Risk.        ATP III CLASSIFICATION (LDL):  <100     mg/dL   Optimal  100-129  mg/dL  Near or Above                    Optimal  130-159  mg/dL   Borderline  160-189  mg/dL   High  >190     mg/dL   Very High   TRIG 68 01/27/2010   CHOLHDL 2.5 01/27/2010    Significant Diagnostic Results in last 30 days:  CT Cervical Spine Wo Contrast  Result Date: 11/24/2020 CLINICAL DATA:  Fall 1 week ago. Neck pain. History of lung cancer. EXAM: CT CERVICAL SPINE WITHOUT CONTRAST TECHNIQUE: Multidetector CT imaging of the cervical spine was performed without intravenous contrast. Multiplanar CT image reconstructions were also generated. COMPARISON:  CT cervical spine 05/29/2017 FINDINGS: Alignment: Normal Skull base and vertebrae: Negative for fracture in the cervical spine. 5 mm lucency in the C2 vertebral body on the left unchanged from the prior CT. 5 mm lucency in the superior C6 vertebral body also unchanged from the prior study. These are ill-defined and nonspecific in appearance but appear stable. Soft tissues and spinal canal: Negative Disc levels: Mild disc and facet degeneration. Mild left foraminal narrowing C4-5 and C5-6 due to spurring. Upper chest: Lung apices clear bilaterally. Other: None IMPRESSION: Negative for cervical fracture Lucent lesions in the C2 and C6 vertebral bodies unchanged from 2018 CT. Electronically Signed   By: Franchot Gallo M.D.   On: 11/24/2020 18:11   CT Thoracic Spine Wo Contrast  Result Date: 11/24/2020 CLINICAL DATA:  Fall 1 week ago.  Back pain EXAM: CT THORACIC SPINE WITHOUT  CONTRAST TECHNIQUE: Multidetector CT images of the thoracic were obtained using the standard protocol without intravenous contrast. COMPARISON:  CT chest 05/18/2019 FINDINGS: Alignment: Normal alignment.  Mild thoracic kyphosis. Vertebrae: Negative for fracture. Paraspinal and other soft tissues: 2 spinal cord stimulators are in place. The upper stimulator leads extends from T3 through T5. The lower stimulator leads are from T7 through T9 in the dorsal canal. Right posterior pleural thickening and calcification. Both lung both lungs are clear without infiltrate. Left-sided Port-A-Cath tip in the SVC. Disc levels: Normal disc spaces. No significant disc degeneration or stenosis in the thoracic spine. IMPRESSION: Negative for thoracic fracture. Electronically Signed   By: Franchot Gallo M.D.   On: 11/24/2020 18:06    Assessment/Plan   Pressure injury of contiguous region involving back and buttock, stage 1, unspecified laterality Sacral area/bilateral gluteal area red-brownish area non-blanchable and tender to touch. - Avoid prolong sitting change position  frequently to prevent further skin breakdown - cleanse pressure ulcer area on buttock and sacral area well with dial soap and warm water,pat dry then apply a generous amount of Balmex cream twice daily and as needed.Avoid wiping area too hard.  - Advised sit on a gel cushion or donut cushion to relief pressure.  - Notify provider for any redness,drainage or ulcer area opens up  - ZINC OXIDE, TOPICAL, 10 % CREA; Apply 1 application topically in the morning and at bedtime.  Dispense: 113 g; Refill: 3 - follow up in 1 week for evaluation.Apply foam dressing if worsening and might consider HHN for dressing management.   Family/ staff Communication: Reviewed plan of care with patient verbalized understanding.   Labs/tests ordered: None   Next Appointment: 1 week for sacral/gluteal area ulcer evaluation   Sandrea Hughs, NP

## 2020-12-09 ENCOUNTER — Encounter (HOSPITAL_BASED_OUTPATIENT_CLINIC_OR_DEPARTMENT_OTHER): Payer: Self-pay

## 2020-12-09 ENCOUNTER — Other Ambulatory Visit: Payer: Self-pay

## 2020-12-09 ENCOUNTER — Emergency Department (HOSPITAL_BASED_OUTPATIENT_CLINIC_OR_DEPARTMENT_OTHER)
Admission: EM | Admit: 2020-12-09 | Discharge: 2020-12-09 | Disposition: A | Discharge status: Home / Self Care | Payer: Medicare PPO | Attending: Emergency Medicine | Admitting: Emergency Medicine

## 2020-12-09 ENCOUNTER — Emergency Department (HOSPITAL_BASED_OUTPATIENT_CLINIC_OR_DEPARTMENT_OTHER): Payer: Medicare PPO

## 2020-12-09 ENCOUNTER — Ambulatory Visit: Payer: Medicare PPO | Admitting: Nurse Practitioner

## 2020-12-09 DIAGNOSIS — N189 Chronic kidney disease, unspecified: Secondary | ICD-10-CM | POA: Diagnosis not present

## 2020-12-09 DIAGNOSIS — M549 Dorsalgia, unspecified: Secondary | ICD-10-CM | POA: Insufficient documentation

## 2020-12-09 DIAGNOSIS — Z79899 Other long term (current) drug therapy: Secondary | ICD-10-CM | POA: Diagnosis not present

## 2020-12-09 DIAGNOSIS — R1011 Right upper quadrant pain: Secondary | ICD-10-CM | POA: Insufficient documentation

## 2020-12-09 DIAGNOSIS — E039 Hypothyroidism, unspecified: Secondary | ICD-10-CM | POA: Insufficient documentation

## 2020-12-09 DIAGNOSIS — R109 Unspecified abdominal pain: Secondary | ICD-10-CM

## 2020-12-09 DIAGNOSIS — E871 Hypo-osmolality and hyponatremia: Secondary | ICD-10-CM | POA: Diagnosis not present

## 2020-12-09 LAB — CBC WITH DIFFERENTIAL/PLATELET
Abs Immature Granulocytes: 0.02 10*3/uL (ref 0.00–0.07)
Basophils Absolute: 0 10*3/uL (ref 0.0–0.1)
Basophils Relative: 1 %
Eosinophils Absolute: 0.1 10*3/uL (ref 0.0–0.5)
Eosinophils Relative: 1 %
HCT: 38 % (ref 36.0–46.0)
Hemoglobin: 12.6 g/dL (ref 12.0–15.0)
Immature Granulocytes: 0 %
Lymphocytes Relative: 25 %
Lymphs Abs: 1.1 10*3/uL (ref 0.7–4.0)
MCH: 29.7 pg (ref 26.0–34.0)
MCHC: 33.2 g/dL (ref 30.0–36.0)
MCV: 89.6 fL (ref 80.0–100.0)
Monocytes Absolute: 0.4 10*3/uL (ref 0.1–1.0)
Monocytes Relative: 8 %
Neutro Abs: 2.9 10*3/uL (ref 1.7–7.7)
Neutrophils Relative %: 65 %
Platelets: 177 10*3/uL (ref 150–400)
RBC: 4.24 MIL/uL (ref 3.87–5.11)
RDW: 12.4 % (ref 11.5–15.5)
WBC: 4.5 10*3/uL (ref 4.0–10.5)
nRBC: 0 % (ref 0.0–0.2)

## 2020-12-09 LAB — COMPREHENSIVE METABOLIC PANEL
ALT: 17 U/L (ref 0–44)
AST: 27 U/L (ref 15–41)
Albumin: 3.1 g/dL — ABNORMAL LOW (ref 3.5–5.0)
Alkaline Phosphatase: 48 U/L (ref 38–126)
Anion gap: 11 (ref 5–15)
BUN: 21 mg/dL (ref 8–23)
CO2: 22 mmol/L (ref 22–32)
Calcium: 8 mg/dL — ABNORMAL LOW (ref 8.9–10.3)
Chloride: 97 mmol/L — ABNORMAL LOW (ref 98–111)
Creatinine, Ser: 1.03 mg/dL — ABNORMAL HIGH (ref 0.44–1.00)
GFR, Estimated: 58 mL/min — ABNORMAL LOW (ref >60)
Glucose, Bld: 99 mg/dL (ref 70–99)
Potassium: 4.2 mmol/L (ref 3.5–5.1)
Sodium: 130 mmol/L — ABNORMAL LOW (ref 135–145)
Total Bilirubin: 0.6 mg/dL (ref 0.3–1.2)
Total Protein: 6.5 g/dL (ref 6.5–8.1)

## 2020-12-09 LAB — URINALYSIS, ROUTINE W REFLEX MICROSCOPIC
Bilirubin Urine: NEGATIVE
Glucose, UA: NEGATIVE mg/dL
Hgb urine dipstick: NEGATIVE
Ketones, ur: NEGATIVE mg/dL
Nitrite: NEGATIVE
Protein, ur: NEGATIVE mg/dL
Specific Gravity, Urine: 1.015 (ref 1.005–1.030)
pH: 6 (ref 5.0–8.0)

## 2020-12-09 LAB — TROPONIN I (HIGH SENSITIVITY): Troponin I (High Sensitivity): 4 ng/L (ref <18)

## 2020-12-09 LAB — URINALYSIS, MICROSCOPIC (REFLEX): Squamous Epithelial / HPF: NONE SEEN (ref 0–5)

## 2020-12-09 LAB — LIPASE, BLOOD: Lipase: 32 U/L (ref 11–51)

## 2020-12-09 MED ORDER — HEPARIN SOD (PORK) LOCK FLUSH 100 UNIT/ML IV SOLN
500.0000 [IU] | Freq: Once | INTRAVENOUS | Status: AC
  Administered 2020-12-09: 500 [IU]
  Filled 2020-12-09: qty 5

## 2020-12-09 MED ORDER — NITROFURANTOIN MONOHYD MACRO 100 MG PO CAPS: 100.0000 mg | ORAL_CAPSULE | Freq: Two times a day (BID) | ORAL | 0 refills | Status: DC

## 2020-12-09 MED ORDER — FENTANYL CITRATE (PF) 100 MCG/2ML IJ SOLN
100.0000 ug | Freq: Once | INTRAMUSCULAR | Status: AC
  Administered 2020-12-09: 100 ug via INTRAVENOUS
  Filled 2020-12-09: qty 2

## 2020-12-09 MED ORDER — IOHEXOL 350 MG/ML SOLN
100.0000 mL | Freq: Once | INTRAVENOUS | Status: AC | PRN
  Administered 2020-12-09: 100 mL via INTRAVENOUS

## 2020-12-09 MED ORDER — ONDANSETRON HCL 4 MG/2ML IJ SOLN
4.0000 mg | Freq: Once | INTRAMUSCULAR | Status: AC
  Administered 2020-12-09: 4 mg via INTRAVENOUS
  Filled 2020-12-09: qty 2

## 2020-12-09 MED ORDER — SODIUM CHLORIDE 0.9 % IV BOLUS
500.0000 mL | Freq: Once | INTRAVENOUS | Status: AC
  Administered 2020-12-09: 500 mL via INTRAVENOUS

## 2020-12-09 NOTE — ED Triage Notes (Signed)
Pt reports back pain and abdominal pain started at 4:00 AM today.

## 2020-12-09 NOTE — ED Notes (Signed)
MD aware of delta trop not being collected prior to DC.  IV and Port was de-accessed prior to obtaining repeat labs.

## 2020-12-09 NOTE — Discharge Instructions (Addendum)
You were seen for right upper quadrant abdominal pain and right flank pain.  You had lab work urinalysis and a CAT scan of your chest abdomen and pelvis that did not show an obvious explanation for your symptoms.  Your urine was possibly infected and we are putting you on some antibiotics for that.  Please contact your primary care doctor for close follow-up.  Return to the emergency department for any worsening or concerning symptoms.

## 2020-12-09 NOTE — ED Provider Notes (Signed)
[facility] EMERGENCY DEPARTMENT Provider Note   CSN: [id number] Arrival date & time: 12/09/20  1052     History Chief Complaint  Patient presents with  . Abdominal Pain  . Back Pain    Megan Brewer is a 75 y.o. female.  She is brought in by her husband for acute onset at 4 AM of right-sided upper abdominal pain radiating into her mid back.  She denies any trauma.  It does not radiate anywhere.  She rates it as 10 out of 10.  No nausea vomiting diarrhea constipation or urinary symptoms.  No chest pain or shortness of breath.  She went to urgent care and was referred here.  The history is provided by the patient.  Abdominal Pain Pain location:  RUQ Pain quality: aching   Pain radiates to:  Back Pain severity:  Severe Onset quality:  Sudden Timing:  Constant Progression:  Unchanged Chronicity:  New Context: not trauma   Relieved by:  Nothing Worsened by:  Nothing Ineffective treatments:  None tried Associated symptoms: no chest pain, no constipation, no cough, no diarrhea, no dysuria, no fever, no nausea, no shortness of breath, no sore throat and no vomiting   Back Pain Associated symptoms: abdominal pain   Associated symptoms: no chest pain, no dysuria, no fever and no headaches        Past Medical History:  Diagnosis Date  . Allergy   . Anemia   . Arachnoiditis   . Bipolar disorder ([place])   . Blood transfusion without reported diagnosis   . Cataract    removed  . Chronic back pain   . Chronic pain   . Chronic post-thoracotomy pain   . CKD (chronic kidney disease)   . GERD (gastroesophageal reflux disease)   . Hypogammaglobulinemia ([place])   . Hypotension   . Hypothyroid   . 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 Lung cancer ([place]) 2011, 2014  . Memory loss   . Migraine   . Opioid abuse ([place])   . Osteoporosis   . Presence of neurostimulator   . Recurrent upper respiratory infection (URI)   . Restless legs   . Sleep apnea   . Tremor    . Urge incontinence   . Urticaria    long time ago, nothing recent    Patient Active Problem List   Diagnosis Date Noted  . Chest discomfort 03/22/2019  . Weight loss 01/27/2019  . Acquired hypothyroidism 01/27/2019  . Esophagitis 01/27/2019  . Intractable migraine without status migrainosus 01/27/2019  . Osteoporosis 01/27/2019  . Chronic sinusitis 12/28/2018  . Food intolerance/GI symptoms 12/28/2018  . Acute maxillary sinusitis 12/28/2018  . Lower leg edema 12/20/2018  . Chronic pain of both shoulders 05/24/2018  . Bilateral leg edema 05/24/2018  . Chronic pain syndrome 03/31/2018  . Left bundle branch block 10/28/2017  . Palpitations 10/28/2017  . History of gastric bypass 05/21/2017  . Polypharmacy 02/11/2017  . Senile nuclear sclerosis 11/26/2016  . Chronic post-thoracotomy pain 04/09/2014  . Chronic kidney disease 04/09/2014  . Sleep disturbances 09/20/2012  . Restless legs syndrome 07/07/2012  . Hypertonicity of bladder 05/23/2012  . Essential tremor 04/01/2012  . Memory loss 04/01/2012  . Incomplete emptying of bladder 01/14/2012  . Urge incontinence 10/28/2011  . Tension type headache 06/10/2011  . Mixed urge and stress incontinence 05/28/2011  . Hypogammaglobulinemia ([place]) 06/22/2010  . Severe episode of recurrent major depressive disorder, without psychotic features ([place]) 06/22/2010  . Hypothyroidism 02/12/2010  .  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 ARACHNOIDITIS 01/31/2010  . OBSTRUCTIVE SLEEP APNEA 01/31/2010  . Chronic rhinitis 01/31/2010  . Arachnoiditis 01/31/2010    Past Surgical History:  Procedure Laterality Date  . ABDOMINAL HYSTERECTOMY    . CARPAL TUNNEL RELEASE    . CATARACT EXTRACTION Bilateral 2019  . CHOLECYSTECTOMY    . COLONOSCOPY  2015   [facility]  . CT LUNG SCREENING  2018  . DG  BONE DENSITY ([place] HX)  2018  . DIAGNOSTIC MAMMOGRAM  2019  . GASTRIC BYPASS    . LUNG CANCER SURGERY  02/2010  . MULTIPLE TOOTH EXTRACTIONS     . SPINAL CORD STIMULATOR IMPLANT       OB History   No obstetric history on file.     Family History  Problem Relation Age of Onset  . Hypertension Mother   . GER disease Mother   . Dementia Mother   . Osteoporosis Mother   . Arthritis Mother   . Bipolar disorder Mother   . Parkinson's disease Father   . Congestive Heart Failure Father   . Pneumonia Father   . Arthritis Father   . Dementia Father   . Depression Father   . Asthma Sister   . Depression Sister   . Bipolar disorder Brother   . Asthma Daughter   . Colon cancer Neg Hx   . Esophageal cancer Neg Hx   . Stomach cancer Neg Hx   . Rectal cancer Neg Hx     Social History   Tobacco Use  . Smoking status: Never Smoker  . Smokeless tobacco: Never Used  Vaping Use  . Vaping Use: Never used  Substance Use Topics  . Alcohol use: Not Currently  . Drug use: Never    Home Medications Prior to Admission medications   Medication Sig Start Date End Date Taking? Authorizing Provider  acetaminophen (TYLENOL) 500 MG tablet Take 1-2 tablets (500-1,000 mg total) by mouth every 8 (eight) hours as needed (Max dose 3000 mg in 24 hours). 06/11/20   [name], NP  Azelastine-Fluticasone 137-50 MCG/ACT SUSP ONE SPRAY EACH NOSTRIL TWICE A DAY AS NEEDED 10/01/20   Ambs, Kathrine Cords, FNP  baclofen (LIORESAL) 10 MG tablet Take 10 mg by mouth every 28 (twenty-eight) days. 06/03/20   [provider]  Buprenorphine HCl-Naloxone HCl 4-1 MG FILM Place 1 strip under the tongue 2 (two) times daily. 10/31/19   [provider]  Calcium Citrate 200 MG TABS Take 2 tablets by mouth daily. 12/02/16   [provider]  COVID-19 mRNA Vac-TriS, Pfizer, (PFIZER-BIONT COVID-19 VAC-TRIS) SUSP injection Inject into the muscle. 12/03/20   Carlyle Basques, MD  cycloSPORINE (RESTASIS) 0.05 % ophthalmic emulsion Place 1 drop into both eyes 2 (two) times daily.    [provider]  dexlansoprazole (DEXILANT) 60 MG capsule  Take 1 capsule (60 mg total) by mouth daily. Crack open the capsule and pour into spoon prior to ingestion 06/21/20   Armbruster, Carlota Raspberry, MD  Erenumab-aooe (AIMOVIG, 140 MG DOSE,) 70 MG/ML SOAJ Inject into the skin every 30 (thirty) days.    [provider]  estradiol (ESTRACE) 0.1 MG/GM vaginal cream 3 (three) times a week. 01/12/20   [provider]  ferrous sulfate 325 (65 FE) MG tablet Take 325 mg by mouth 2 (two) times daily with a meal.    [provider]  hydrOXYzine (VISTARIL) 25 MG capsule TAKE ONE CAPSULE BY MOUTH TWICE DAILY AS NEEDED 11/25/20  Thayer Headings, PMHNP  ibuprofen (ADVIL) 200 MG tablet Take 200 mg by mouth every 6 (six) hours as needed.    [provider]  immune globulin, human, (GAMMAGARD S/D LESS IGA) 10 g injection Inject into the vein every 21 ( twenty-one) days.     [provider]  lamoTRIgine (LAMICTAL) 200 MG tablet Take 1 tablet (200 mg total) by mouth daily. 09/16/20   Thayer Headings, PMHNP  LASTACAFT 0.25 % SOLN Apply 1 drop to eye daily. 01/19/19   [provider]  levothyroxine (SYNTHROID) 75 MCG tablet Take one tablet by mouth once daily 30 minutes before breakfast on empty stomach. 08/21/20   [name], NP  lidocaine (LIDODERM) 5 % Place 1 patch onto the skin daily. Remove & Discard patch within 12 hours or as directed by MD 11/24/20   Drenda Freeze, MD  magnesium oxide (MAG-OX) 400 MG tablet Take 400 mg by mouth daily.     [provider]  potassium chloride SA (KLOR-CON) 20 MEQ tablet TAKE TWO TABLETS BY MOUTH DAILY (MAY DISSOLVE IN WATER) 06/11/20   [name], NP  rOPINIRole (REQUIP) 1 MG tablet TAKE 1 TABLET BY MOUTH AT BEDTIME 09/09/20   Patel, Donika K, DO  sucralfate (CARAFATE) 1 GM/10ML suspension Take 2 tsp by mouth three times daily as needed. 08/07/20   Ngetich, Dinah C, NP  tiZANidine (ZANAFLEX) 4 MG tablet Take 1 tablet (4 mg total) by mouth every 6 (six) hours as  needed for muscle spasms. 11/24/20   Drenda Freeze, MD  torsemide (DEMADEX) 20 MG tablet TAKE 1 TABLET BY MOUTH DAILY 07/23/20   [name], NP  triamcinolone cream (KENALOG) 0.1 % Apply 1 application topically 2 (two) times daily as needed.    [provider]  ZINC OXIDE, TOPICAL, 10 % CREA Apply 1 application topically in the morning and at bedtime. 12/06/20   Ngetich, Dinah C, NP    Allergies    Tetracycline, Corticosteroids, Cefixime, Ciprofloxacin, Doxycycline, Gabapentin, Isometheptene-dichloral-apap, Ketorolac, Prednisone, Promethazine, Stadol [butorphanol], Erythromycin base, and Propoxyphene  Review of Systems   Review of Systems  Constitutional: Negative for fever.  HENT: Negative for sore throat.   Eyes: Negative for visual disturbance.  Respiratory: Negative for cough and shortness of breath.   Cardiovascular: Negative for chest pain.  Gastrointestinal: Positive for abdominal pain. Negative for constipation, diarrhea, nausea and vomiting.  Genitourinary: Negative for dysuria.  Musculoskeletal: Positive for back pain.  Skin: Negative for rash.  Neurological: Negative for headaches.    Physical Exam Updated Vital Signs BP (!) 138/58 (BP Location: Right Arm)   Pulse 79   Temp 98.2 F (36.8 C) (Oral)   Resp 20   Ht 5\' 3"  (1.6 m)   Wt 52.2 kg   SpO2 96%   BMI 20.37 kg/m   Physical Exam Vitals and nursing note reviewed.  Constitutional:      General: She is not in acute distress.    Appearance: Normal appearance. She is well-developed and underweight.  HENT:     Head: Normocephalic and atraumatic.  Eyes:     Conjunctiva/sclera: Conjunctivae normal.  Cardiovascular:     Rate and Rhythm: Normal rate and regular rhythm.     Heart sounds: No murmur heard.   Pulmonary:     Effort: Pulmonary effort is normal. No respiratory distress.     Breath sounds: Normal breath sounds.  Abdominal:     Palpations: Abdomen is soft.     Tenderness:  There is  no abdominal tenderness. There is no guarding or rebound.  Musculoskeletal:        General: No deformity or signs of injury. Normal range of motion.     Cervical back: Neck supple.  Skin:    General: Skin is warm and dry.  Neurological:     General: No focal deficit present.     Mental Status: She is alert.     ED Results / Procedures / Treatments   Labs (all labs ordered are listed, but only abnormal results are displayed) Labs Reviewed  COMPREHENSIVE METABOLIC PANEL - Abnormal; Notable for the following components:      Result Value   Sodium 130 (*)    Chloride 97 (*)    Creatinine, Ser 1.03 (*)    Calcium 8.0 (*)    Albumin 3.1 (*)    GFR, Estimated 58 (*)    All other components within normal limits  URINALYSIS, ROUTINE W REFLEX MICROSCOPIC - Abnormal; Notable for the following components:   APPearance CLOUDY (*)    Leukocytes,Ua TRACE (*)    All other components within normal limits  URINALYSIS, MICROSCOPIC (REFLEX) - Abnormal; Notable for the following components:   Bacteria, UA MANY (*)    All other components within normal limits  URINE CULTURE  CBC WITH DIFFERENTIAL/PLATELET  LIPASE, BLOOD  TROPONIN I (HIGH SENSITIVITY)  TROPONIN I (HIGH SENSITIVITY)    EKG EKG Interpretation  Date/Time:  Monday December 09 2020 11:18:53 EDT Ventricular Rate:  72 PR Interval:  158 QRS Duration: 138 QT Interval:  448 QTC Calculation: 491 R Axis:   38 Text Interpretation: Sinus rhythm Left bundle branch block Baseline wander in lead(s) V5 No significant change since prior 8/20 Confirmed by Aletta Edouard [phone]) on 12/09/2020 11:21:55 AM   Radiology CT Angio Chest/Abd/Pel for Dissection W and/or W/WO  Result Date: 12/09/2020 CLINICAL DATA:  Chest and abdominal pain radiating to back EXAM: CT ANGIOGRAPHY CHEST, ABDOMEN AND PELVIS TECHNIQUE: Non-contrast CT of the chest was initially obtained. Multidetector CT imaging through the chest, abdomen and pelvis was performed using  the standard protocol during bolus administration of intravenous contrast. Multiplanar reconstructed images and MIPs were obtained and reviewed to evaluate the vascular anatomy. CONTRAST:  100 mL OMNIPAQUE IOHEXOL 350 MG/ML SOLN COMPARISON:  Chest CT May 18, 2019 FINDINGS: CTA CHEST FINDINGS Cardiovascular: No intramural hematoma is evident in the thoracic aorta on noncontrast enhanced study. There is no thoracic aortic aneurysm or dissection. No evident mediastinal hematoma. There is slight calcification at several sites in the visualized great vessels. No great vessel aneurysm or dissection. There are foci of aortic atherosclerosis. No hemodynamically significant obstruction or plaque ulceration evident. No pericardial effusion or pericardial thickening. No evident pulmonary embolus. Port-A-Cath tip is in the superior vena cava. Mediastinum/Nodes: Visualized thyroid appears normal. No appreciable thoracic adenopathy. No esophageal lesions appreciable. Lungs/Pleura: Postoperative right lower lobectomy with chronic pleural scarring and calcification. Volume loss on the right is unchanged compared to the 2020 study. No edema or airspace opacity. No pleural effusions. Trachea and major bronchial structures appear patent. No pneumothorax. Musculoskeletal: No blastic or lytic bone lesions. Spinal cord stimulators present with lead tips in the thoracic region. No chest wall lesions. Note that port is on the left anteriorly. Review of the MIP images confirms the above findings. CTA ABDOMEN AND PELVIS FINDINGS VASCULAR Aorta: There is no abdominal aortic aneurysm or dissection. Scattered foci of aortic atherosclerosis noted in the aorta without hemodynamically significant obstruction or  plaque ulceration. Celiac: Celiac artery and its branches are patent. No aneurysm or dissection involving these vessels. SMA: Superior mesenteric artery and its branches are patent without aneurysm or dissection. No appreciable plaque in  these vessels. Renals: Single renal artery on each side. No aneurysm or dissection involving the renal arteries or branches. No fibromuscular dysplasia. No appreciable atherosclerotic plaque in these vessels. IMA: Inferior mesenteric artery and its branches are patent. No aneurysm or dissection involving these vessels evident. Inflow: Mild calcification noted in the right common iliac artery. No hemodynamically significant obstruction noted in the pelvic arterial vessels. No aneurysm or dissection involving these vessels. Visualized superficial femoral and profunda femoral arteries patent. Veins: No obvious venous abnormality within the limitations of this arterial phase study. Review of the MIP images confirms the above findings. NON-VASCULAR Hepatobiliary: There is a degree of hepatic steatosis. No focal liver lesions appreciable. Gallbladder absent. There is intrahepatic biliary duct dilatation. There is dilatation of the common hepatic and proximal common bile duct with tapering distally. No obstructing focus noted in the biliary ductal system. Pancreas: No pancreatic mass or inflammatory focus. Spleen: No splenic lesions evident. Adrenals/Urinary TRact: Adrenals bilaterally appear within normal limits. No evident renal mass or hydronephrosis on either side. No renal or ureteral calculus is evident on either side. Urinary bladder is midline with wall thickness within normal limits. Stomach/bBowel: There is moderate stool throughout colon. Patient is status post gastric bypass procedure without wall thickening or fluid in the perioperative regions. Elsewhere there is no bowel wall thickening. No bowel obstruction. Terminal ileum appears normal. No periappendiceal region inflammation. No free air or portal venous air. Reproductive: Uterus absent.  No adnexal masses. Lymphatic: No adenopathy evident in the abdomen or pelvis. Other: No ascites or abscess in the abdomen or pelvis. Spinal stimulators noted posteriorly  on each side. Musculoskeletal: There is degenerative change in the lumbar spine. No blastic or lytic bone lesions. No intramuscular lesions. Review of the MIP images confirms the above findings. IMPRESSION: CT angiogram chest: 1. No thoracic aortic aneurysm or dissection. Foci of aortic and great vessel atherosclerosis. No hemodynamically significant plaque in the aorta or visualized great vessels. No ulceration. No intramural hematoma. 2.  No evident pulmonary embolus. 3. Status post left lower lobectomy with postoperative scarring and calcification. No edema or airspace opacity. 4.  No evident adenopathy. CT angiogram abdomen; CT angiogram pelvis: 1. Scattered foci of aortic and right common iliac artery atherosclerosis. No hemodynamically significant obstruction vessels. No aneurysm or dissection involving the aorta, major mesenteric, or major pelvic arterial vessels. No evidence of fibromuscular dysplasia. 2.  Hepatic steatosis. 3. There is a degree of intrahepatic and extrahepatic biliary duct dilatation without obstructing focus seen by CT. Gallbladder absent. 4.  Uterus absent. 5. No bowel obstruction. No abscess in the abdomen or pelvis. Status post gastric bypass procedure without complicating feature. 6. No evident renal or ureteral calculus. No hydronephrosis. Urinary bladder wall thickness normal. Electronically Signed   By: Lowella Grip III M.D.   On: 12/09/2020 13:49    Procedures Procedures   Medications Ordered in ED Medications  fentaNYL (SUBLIMAZE) injection 100 mcg (has no administration in time range)  ondansetron (ZOFRAN) injection 4 mg (has no administration in time range)  sodium chloride 0.9 % bolus 500 mL (has no administration in time range)    ED Course  I have reviewed the triage vital signs and the nursing notes.  Pertinent labs & imaging results that were available during my care of  the patient were reviewed by me and considered in my medical decision making (see  chart for details).  Clinical Course as of 12/09/20 [place] Dec 09, 2020  1242 Patient states her pain is better after pain medication.  When I tried to pin down her chronic pain she said it is usually a little bit more to the right posterior rib cage and this back pain is more central. [MB]  1407 Reviewed results of her testing with her and her husband.  Recommended she consider trial of a laxative.  She will follow-up with her primary care doctor regarding her UTI.  Will cover with some antibiotics.  Return instructions discussed [MB]    Clinical Course User Index [MB] Hayden Rasmussen, MD   MDM Rules/Calculators/A&P                         This patient complains of right upper quadrant and right flank pain; this involves an extensive number of treatment Options and is a complaint that carries with it a high risk of complications and Morbidity. The differential includes gallbladder leak, hepatitis, renal colic, pyelonephritis, peptic ulcer disease, obstruction, colitis, ACS, chronic pain  I ordered, reviewed and interpreted labs, which included CBC with normal white count normal hemoglobin, chemistries with mildly low sodium and elevated creatinine possibly reflecting some dehydration, urinalysis equivocal for infection with 6-10 whites and many bacteria, troponin unremarkable, LFTs and lipase normal I ordered medication IV fluids and nausea medication, IV pain medicine with improvement in her symptoms I ordered imaging studies which included CT angio chest abdomen and pelvis and I independently    visualized and interpreted imaging which showed no acute findings Additional history obtained from patient's husband Previous records obtained and reviewed in epic, no recent admissions  After the interventions stated above, I reevaluated the patient and found patient's pain to be improved.  There is nothing in her work-up or imaging so far shows anything that would require admission.  She is  comfortable plan for treatment of a possible UTI and outpatient follow-up with her providers.  Return instructions discussed   Final Clinical Impression(s) / ED Diagnoses Final diagnoses:  RUQ abdominal pain  Right flank pain  Hyponatremia    Rx / DC Orders ED Discharge Orders         Ordered    nitrofurantoin, macrocrystal-monohydrate, (MACROBID) 100 MG capsule  2 times daily        12/09/20 1410           Hayden Rasmussen, MD 12/09/20 1705

## 2020-12-12 LAB — URINE CULTURE: Culture: >=100000 — AB

## 2020-12-13 ENCOUNTER — Ambulatory Visit: Payer: Medicare PPO | Admitting: Nurse Practitioner

## 2020-12-13 ENCOUNTER — Encounter: Payer: Self-pay | Admitting: Nurse Practitioner

## 2020-12-13 ENCOUNTER — Other Ambulatory Visit: Payer: Self-pay

## 2020-12-13 VITALS — BP 126/84 | HR 87 | Temp 97.3°F | Ht 63.0 in | Wt 116.2 lb

## 2020-12-13 DIAGNOSIS — K529 Noninfective gastroenteritis and colitis, unspecified: Secondary | ICD-10-CM | POA: Diagnosis not present

## 2020-12-13 NOTE — Progress Notes (Signed)
Careteam: Patient Care Team: [name], NP as PCP - General (Geriatric Medicine) Alda Berthold, DO as Consulting Physician (Neurology)  PLACE OF SERVICE:  [place] Directive information Does Patient Have a Medical Advance Directive?: No, Does patient want to make changes to medical advance directive?: No - Patient declined  Allergies  Allergen Reactions  . Tetracycline Nausea Only    Causes ringing in ears, dizziness, migraine, nausea  . Corticosteroids Other (See Comments)    12/02/2016 interview by Melissa Montane: Oral dosing causes migraines/nausea/tinnitus. Prev tolerated IV and intranasal admin w/o difficulty.  . Cefixime Other (See Comments)    Headache   . Ciprofloxacin Other (See Comments)    Headache, dizziness, ringing in the ears  . Doxycycline Other (See Comments)    unknown  . Gabapentin     Throat swells/closes  . Isometheptene-Dichloral-Apap Other (See Comments)    unknown  . Ketorolac     12/02/2016 interview by HAL: Oral dosing prednisone/corticosteroids causes migraines/nausea/tinnitus. Prev tolerated IV and intranasal admin w/o difficulty.  . Prednisone Other (See Comments)    Migraine, dizzy, ringing in ears-can take it IV 12/02/2016 interview by [name]: Oral prednisone dosing causes migraines/nausea/tinnitus. Prev tolerated IV and intranasal admin w/o difficulty.  . Promethazine Other (See Comments)    causes severe abdominal pain when taken IV; however she can take PO or IM  . Stadol [Butorphanol]     Cerebral pain  . Erythromycin Base Diarrhea and Itching    Severe diarrhea  . Propoxyphene Nausea Only    Chief Complaint  Patient presents with  . Medical Management of Chronic Issues    6 month follow up. Patient complains of nausea, vomiting, diarrhea, headache, joint pain and trouble getting warm. Symptoms started at midnight.     HPI: Patient is a 75 y.o. female for follow up  She went to urgent care on 12/09/20 and found to have  UTI. She was placed on macrobid BID for 5 days. Completes course tomorrow.  She was not able to take her medication this morning because she was not able to keep anything down. She has several episodes of vomiting that started at midnight.  No sick contacts.  No diarrhea. Abdominal pain when she throws up.  Has not tired to drink any fluids.  The last vomiting episode was 10 am.  No fever but feels chilled.  Last BM yesterday, normally has one every 2-3 days.  Reports BM was normal yesterday.     Review of Systems:  Review of Systems  Constitutional: Positive for chills and malaise/fatigue. Negative for fever.  Respiratory: Negative for cough, sputum production and shortness of breath.   Cardiovascular: Negative for chest pain, palpitations and leg swelling.  Gastrointestinal: Positive for nausea and vomiting. Negative for abdominal pain, blood in stool, constipation, diarrhea and melena.  Musculoskeletal: Positive for joint pain.  Neurological: Positive for headaches.    Past Medical History:  Diagnosis Date  . Allergy   . Anemia   . Arachnoiditis   . Bipolar disorder ([place])   . Blood transfusion without reported diagnosis   . Cataract    removed  . Chronic back pain   . Chronic pain   . Chronic post-thoracotomy pain   . CKD (chronic kidney disease)   . GERD (gastroesophageal reflux disease)   . Hypogammaglobulinemia ([place])   . Hypotension   . Hypothyroid   . 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 Lung cancer ([place]) 2011, 2014  .  Memory loss   . Migraine   . Opioid abuse ([place])   . Osteoporosis   . Presence of neurostimulator   . Recurrent upper respiratory infection (URI)   . Restless legs   . Sleep apnea   . Tremor   . Urge incontinence   . Urticaria    long time ago, nothing recent   Past Surgical History:  Procedure Laterality Date  . ABDOMINAL HYSTERECTOMY    . CARPAL TUNNEL RELEASE    . CATARACT EXTRACTION Bilateral 2019  . CHOLECYSTECTOMY    .  COLONOSCOPY  2015   [facility]  . CT LUNG SCREENING  2018  . DG  BONE DENSITY ([place] HX)  2018  . DIAGNOSTIC MAMMOGRAM  2019  . GASTRIC BYPASS    . LUNG CANCER SURGERY  02/2010  . MULTIPLE TOOTH EXTRACTIONS    . SPINAL CORD STIMULATOR IMPLANT     Social History:   reports that she has never smoked. She has never used smokeless tobacco. She reports previous alcohol use. She reports that she does not use drugs.  Family History  Problem Relation Age of Onset  . Hypertension Mother   . GER disease Mother   . Dementia Mother   . Osteoporosis Mother   . Arthritis Mother   . Bipolar disorder Mother   . Parkinson's disease Father   . Congestive Heart Failure Father   . Pneumonia Father   . Arthritis Father   . Dementia Father   . Depression Father   . Asthma Sister   . Depression Sister   . Bipolar disorder Brother   . Asthma Daughter   . Colon cancer Neg Hx   . Esophageal cancer Neg Hx   . Stomach cancer Neg Hx   . Rectal cancer Neg Hx     Medications: Patient's Medications  New Prescriptions   No medications on file  Previous Medications   ACETAMINOPHEN (TYLENOL) 500 MG TABLET    Take 1-2 tablets (500-1,000 mg total) by mouth every 8 (eight) hours as needed (Max dose 3000 mg in 24 hours).   AZELASTINE-FLUTICASONE 137-50 MCG/ACT SUSP    ONE SPRAY EACH NOSTRIL TWICE A DAY AS NEEDED   BACLOFEN (LIORESAL) 10 MG TABLET    Take 10 mg by mouth every 28 (twenty-eight) days.   BUPRENORPHINE HCL-NALOXONE HCL 4-1 MG FILM    Place 1 strip under the tongue 2 (two) times daily.   CALCIUM CITRATE 200 MG TABS    Take 2 tablets by mouth daily.   CYCLOSPORINE (RESTASIS) 0.05 % OPHTHALMIC EMULSION    Place 1 drop into both eyes 2 (two) times daily.   DEXLANSOPRAZOLE (DEXILANT) 60 MG CAPSULE    Take 1 capsule (60 mg total) by mouth daily. Crack open the capsule and pour into spoon prior to ingestion   ERENUMAB-AOOE (AIMOVIG, 140 MG DOSE,) 70 MG/ML SOAJ    Inject into the skin every 30 (thirty) days.    ESTRADIOL (ESTRACE) 0.1 MG/GM VAGINAL CREAM    3 (three) times a week.   FERROUS SULFATE 325 (65 FE) MG TABLET    Take 325 mg by mouth 2 (two) times daily with a meal.   HYDROXYZINE (VISTARIL) 25 MG CAPSULE    TAKE ONE CAPSULE BY MOUTH TWICE DAILY AS NEEDED   IBUPROFEN (ADVIL) 200 MG TABLET    Take 200 mg by mouth every 6 (six) hours as needed.   IMMUNE GLOBULIN, HUMAN, (GAMMAGARD S/D LESS IGA) 10 G INJECTION    Inject  into the vein every 21 ( twenty-one) days.    LAMOTRIGINE (LAMICTAL) 200 MG TABLET    Take 1 tablet (200 mg total) by mouth daily.   LASTACAFT 0.25 % SOLN    Apply 1 drop to eye daily.   LEVOTHYROXINE (SYNTHROID) 75 MCG TABLET    Take one tablet by mouth once daily 30 minutes before breakfast on empty stomach.   LIDOCAINE (LIDODERM) 5 %    Place 1 patch onto the skin daily. Remove & Discard patch within 12 hours or as directed by MD   MAGNESIUM OXIDE (MAG-OX) 400 MG TABLET    Take 400 mg by mouth daily.    NITROFURANTOIN, MACROCRYSTAL-MONOHYDRATE, (MACROBID) 100 MG CAPSULE    Take 1 capsule (100 mg total) by mouth 2 (two) times daily.   POTASSIUM CHLORIDE SA (KLOR-CON) 20 MEQ TABLET    TAKE TWO TABLETS BY MOUTH DAILY (MAY DISSOLVE IN WATER)   ROPINIROLE (REQUIP) 1 MG TABLET    TAKE 1 TABLET BY MOUTH AT BEDTIME   SUCRALFATE (CARAFATE) 1 GM/10ML SUSPENSION    Take 2 tsp by mouth three times daily as needed.   TIZANIDINE (ZANAFLEX) 4 MG TABLET    Take 1 tablet (4 mg total) by mouth every 6 (six) hours as needed for muscle spasms.   TORSEMIDE (DEMADEX) 20 MG TABLET    TAKE 1 TABLET BY MOUTH DAILY   TRIAMCINOLONE CREAM (KENALOG) 0.1 %    Apply 1 application topically 2 (two) times daily as needed.   ZINC OXIDE, TOPICAL, 10 % CREA    Apply 1 application topically in the morning and at bedtime.  Modified Medications   No medications on file  Discontinued Medications   COVID-19 MRNA VAC-TRIS, PFIZER, (PFIZER-BIONT COVID-19 VAC-TRIS) SUSP INJECTION    Inject into the muscle.     Physical Exam:  Vitals:   12/13/20 1320  BP: 126/84  Pulse: 87  Temp: (!) 97.3 F (36.3 C)  TempSrc: Temporal  SpO2: 98%  Weight: 116 lb 3.2 oz (52.7 kg)  Height: 5\' 3"  (1.6 m)   Body mass index is 20.58 kg/m. Wt Readings from Last 3 Encounters:  12/13/20 116 lb 3.2 oz (52.7 kg)  12/09/20 115 lb (52.2 kg)  12/06/20 118 lb (53.5 kg)    Physical Exam Constitutional:      Appearance: She is well-developed. She is ill-appearing. She is not diaphoretic.  HENT:     Head: Normocephalic and atraumatic.     Mouth/Throat:     Pharynx: No oropharyngeal exudate.  Eyes:     Conjunctiva/sclera: Conjunctivae normal.     Pupils: Pupils are equal, round, and reactive to light.  Cardiovascular:     Rate and Rhythm: Normal rate and regular rhythm.     Heart sounds: Normal heart sounds.  Pulmonary:     Effort: Pulmonary effort is normal.     Breath sounds: Normal breath sounds.  Abdominal:     General: Bowel sounds are normal.     Palpations: Abdomen is soft.     Tenderness: There is abdominal tenderness (epigastric). There is no right CVA tenderness or left CVA tenderness.  Musculoskeletal:        General: No tenderness.     Cervical back: Normal range of motion and neck supple.  Skin:    General: Skin is warm and dry.  Neurological:     Mental Status: She is alert and oriented to person, place, and time.     Labs reviewed: Basic Metabolic Panel:  Recent Labs    06/10/20 1545 06/10/20 1549 08/28/20 1422 08/28/20 1426 11/24/20 1706 12/09/20 1138  NA  --    < >  --  134* 135 130*  K  --    < >  --  2.7* 4.3 4.2  CL  --    < >  --  92* 104 97*  CO2  --    < >  --  29 25 22   GLUCOSE  --    < >  --  119* 122* 99  BUN  --    < >  --  13 28* 21  CREATININE  --    < >  --  0.89 0.91 1.03*  CALCIUM  --    < >  --  9.4 8.1* 8.0*  MG  --   --  1.3*  --   --   --   TSH 5.05*  --   --   --   --   --    < > = values in this interval not displayed.   Liver Function  Tests: Recent Labs    06/10/20 1549 08/28/20 1426 12/09/20 1138  AST 29 41 27  ALT 19 25 17   ALKPHOS  --  45 48  BILITOT 0.2 0.5 0.6  PROT 7.0 7.5 6.5  ALBUMIN  --  4.2 3.1*   Recent Labs    08/28/20 1426 12/09/20 1138  LIPASE 46 32   No results for input(s): AMMONIA in the last 8760 hours. CBC: Recent Labs    08/28/20 1426 11/24/20 1706 12/09/20 1138  WBC 8.4 6.1 4.5  NEUTROABS 6.5 3.9 2.9  HGB 15.5* 12.4 12.6  HCT 44.4 36.6 38.0  MCV 88.1 89.7 89.6  PLT 240 218 177   Lipid Panel: No results for input(s): CHOL, HDL, LDLCALC, TRIG, CHOLHDL, LDLDIRECT in the last 8760 hours. TSH: Recent Labs    06/10/20 1545  TSH 5.05*   A1C: No results found for: HGBA1C   Assessment/Plan 1. Acute gastroenteritis -supportive care at this time.  -Clear liquid diet- advance as tolerated Start with sips and ice chips If able to tolerate liquids start bland diet and then advance from there  To go to the ED for severe abdominal pain, unable to keep fluids down for 24 hours, fever with abdominal pain, confusion or decrease mental status  To follow up in office next week for routine follow up  [place] [place], [facility] [phone]

## 2020-12-13 NOTE — Patient Instructions (Addendum)
Clear liquid diet- advance as tolerated Start with sips and ice chips If able to tolerate liquids start bland diet and then advance from there    To go to the ED for severe abdominal pain, unable to keep fluids down for 24 hours, fever with abdominal pain, confusion or decrease mental status  Viral Gastroenteritis, Adult  Viral gastroenteritis is also known as the stomach flu. This condition may affect your stomach, small intestine, and large intestine. It can cause sudden watery diarrhea, fever, and vomiting. This condition is caused by many different viruses. These viruses can be passed from person to person very easily (are contagious). Diarrhea and vomiting can make you feel weak and cause you to become dehydrated. You may not be able to keep fluids down. Dehydration can make you tired and thirsty, cause you to have a dry mouth, and decrease how often you urinate. It is important to replace the fluids that you lose from diarrhea and vomiting. What are the causes? Gastroenteritis is caused by many viruses, including rotavirus and norovirus. Norovirus is the most common cause in adults. You can get sick after being exposed to the viruses from other people. You can also get sick by:  Eating food, drinking water, or touching a surface contaminated with one of these viruses.  Sharing utensils or other personal items with an infected person. What increases the risk? You are more likely to develop this condition if you:  Have a weak body defense system (immune system).  Live with one or more children who are younger than 64 years old.  Live in a nursing home.  Travel on cruise ships. What are the signs or symptoms? Symptoms of this condition start suddenly 1-3 days after exposure to a virus. Symptoms may last for a few days or for as long as a week. Common symptoms include watery diarrhea and vomiting. Other symptoms include:  Fever.  Headache.  Fatigue.  Pain in the  abdomen.  Chills.  Weakness.  Nausea.  Muscle aches.  Loss of appetite. How is this diagnosed? This condition is diagnosed with a medical history and physical exam. You may also have a stool test to check for viruses or other infections. How is this treated? This condition typically goes away on its own. The focus of treatment is to prevent dehydration and restore lost fluids (rehydration). This condition may be treated with:  An oral rehydration solution (ORS) to replace important salts and minerals (electrolytes) in your body. Take this if told by your health care provider. This is a drink that is sold at pharmacies and retail stores.  Medicines to help with your symptoms.  Probiotic supplements to reduce symptoms of diarrhea.  Fluids given through an IV, if dehydration is severe. Older adults and people with other diseases or a weak immune system are at higher risk for dehydration. Follow these instructions at home: Eating and drinking  Take an ORS as told by your health care provider.  Drink clear fluids in small amounts as you are able. Clear fluids include: ? Water. ? Ice chips. ? Diluted fruit juice. ? Low-calorie sports drinks.  Drink enough fluid to keep your urine pale yellow.  Eat small amounts of healthy foods every 3-4 hours as you are able. This may include whole grains, fruits, vegetables, lean meats, and yogurt.  Avoid fluids that contain a lot of sugar or caffeine, such as energy drinks, sports drinks, and soda.  Avoid spicy or fatty foods.  Avoid alcohol.  General instructions  Wash your hands often, especially after having diarrhea or vomiting. If soap and water are not available, use hand sanitizer.  Make sure that all people in your household wash their hands well and often.  Take over-the-counter and prescription medicines only as told by your health care provider.  Rest at home while you recover.  Watch your condition for any  changes.  Take a warm bath to relieve any burning or pain from frequent diarrhea episodes.  Keep all follow-up visits as told by your health care provider. This is important.   Contact a health care provider if you:  Cannot keep fluids down.  Have symptoms that get worse.  Have new symptoms.  Feel light-headed or dizzy.  Have muscle cramps. Get help right away if you:  Have chest pain.  Feel extremely weak or you faint.  See blood in your vomit.  Have vomit that looks like coffee grounds.  Have bloody or black stools or stools that look like tar.  Have a severe headache, a stiff neck, or both.  Have a rash.  Have severe pain, cramping, or bloating in your abdomen.  Have trouble breathing or you are breathing very quickly.  Have a fast heartbeat.  Have skin that feels cold and clammy.  Feel confused.  Have pain when you urinate.  Have signs of dehydration, such as: ? Dark urine, very little urine, or no urine. ? Cracked lips. ? Dry mouth. ? Sunken eyes. ? Sleepiness. ? Weakness. Summary  Viral gastroenteritis is also known as the stomach flu. It can cause sudden watery diarrhea, fever, and vomiting.  This condition can be passed from person to person very easily (is contagious).  Take an ORS if told by your health care provider. This is a drink that is sold at pharmacies and retail stores.  Wash your hands often, especially after having diarrhea or vomiting. If soap and water are not available, use hand sanitizer. This information is not intended to replace advice given to you by your health care provider. Make sure you discuss any questions you have with your health care provider. Document Revised: 01/20/2019 Document Reviewed: 06/08/2018 Elsevier Patient Education  2021 [employer].

## 2020-12-16 ENCOUNTER — Ambulatory Visit: Payer: Medicare PPO | Admitting: Nurse Practitioner

## 2020-12-30 ENCOUNTER — Other Ambulatory Visit: Payer: Self-pay | Admitting: Psychiatry

## 2020-12-30 DIAGNOSIS — F419 Anxiety disorder, unspecified: Secondary | ICD-10-CM

## 2020-12-30 DIAGNOSIS — F3162 Bipolar disorder, current episode mixed, moderate: Secondary | ICD-10-CM

## 2020-12-30 DIAGNOSIS — F5105 Insomnia due to other mental disorder: Secondary | ICD-10-CM

## 2021-01-01 ENCOUNTER — Telehealth: Payer: Self-pay

## 2021-01-01 ENCOUNTER — Other Ambulatory Visit: Payer: Self-pay | Admitting: Psychiatry

## 2021-01-01 DIAGNOSIS — F5105 Insomnia due to other mental disorder: Secondary | ICD-10-CM

## 2021-01-01 DIAGNOSIS — F99 Mental disorder, not otherwise specified: Secondary | ICD-10-CM

## 2021-01-01 DIAGNOSIS — F419 Anxiety disorder, unspecified: Secondary | ICD-10-CM

## 2021-01-01 DIAGNOSIS — F3162 Bipolar disorder, current episode mixed, moderate: Secondary | ICD-10-CM

## 2021-01-01 NOTE — Telephone Encounter (Signed)
Incoming call received from patient stating she has had diarrhea x 2 weeks and is unable to eat solids. Patient was offered an appointment and refused due to transportation. Patient husband has covid and is on his last day of isolation, this is another reason why patient prefers to not be out and about. Patient has tried imodium and questions if [name] can give her some other recommendations.  Please advise (do you think this concern can be addressed via a telephone visit)

## 2021-01-01 NOTE — Telephone Encounter (Signed)
Patient is aware of response and scheduled an appointment for tomorrow, with NP Amy Fargo. Patient states she will try her best to find transportation and if not she will have to call and cancel.   I will forward message to Amy as a fyi

## 2021-01-01 NOTE — Telephone Encounter (Signed)
Ideally she needs to be seen in person. Would also need to get labs and stool sample.

## 2021-01-02 ENCOUNTER — Encounter: Payer: Self-pay | Admitting: Orthopedic Surgery

## 2021-01-02 ENCOUNTER — Ambulatory Visit: Payer: Medicare PPO | Admitting: Orthopedic Surgery

## 2021-01-02 ENCOUNTER — Other Ambulatory Visit: Payer: Self-pay

## 2021-01-02 VITALS — BP 122/78 | HR 79 | Temp 96.8°F | Ht 63.0 in | Wt 117.8 lb

## 2021-01-02 DIAGNOSIS — R1011 Right upper quadrant pain: Secondary | ICD-10-CM

## 2021-01-02 DIAGNOSIS — R197 Diarrhea, unspecified: Secondary | ICD-10-CM

## 2021-01-02 MED ORDER — SACCHAROMYCES BOULARDII 250 MG PO CAPS: 250.0000 mg | ORAL_CAPSULE | Freq: Two times a day (BID) | ORAL | 3 refills | Status: DC

## 2021-01-02 NOTE — Progress Notes (Signed)
Careteam: Patient Care Team: [name], NP as PCP - General (Geriatric Medicine) Alda Berthold, DO as Consulting Physician (Neurology)  Seen by: Windell Moulding, AGNP-C  PLACE OF SERVICE:  [place]  Advanced Directive information    Allergies  Allergen Reactions  . Tetracycline Nausea Only    Causes ringing in ears, dizziness, migraine, nausea  . Corticosteroids Other (See Comments)    12/02/2016 interview by Melissa Montane: Oral dosing causes migraines/nausea/tinnitus. Prev tolerated IV and intranasal admin w/o difficulty.  . Cefixime Other (See Comments)    Headache   . Ciprofloxacin Other (See Comments)    Headache, dizziness, ringing in the ears  . Doxycycline Other (See Comments)    unknown  . Gabapentin     Throat swells/closes  . Isometheptene-Dichloral-Apap Other (See Comments)    unknown  . Ketorolac     12/02/2016 interview by WER: Oral dosing prednisone/corticosteroids causes migraines/nausea/tinnitus. Prev tolerated IV and intranasal admin w/o difficulty.  . Prednisone Other (See Comments)    Migraine, dizzy, ringing in ears-can take it IV 12/02/2016 interview by [name]: Oral prednisone dosing causes migraines/nausea/tinnitus. Prev tolerated IV and intranasal admin w/o difficulty.  . Promethazine Other (See Comments)    causes severe abdominal pain when taken IV; however she can take PO or IM  . Stadol [Butorphanol]     Cerebral pain  . Erythromycin Base Diarrhea and Itching    Severe diarrhea  . Propoxyphene Nausea Only    Chief Complaint  Patient presents with  . Acute Visit    Diarrhea since last office visit, patient tried imodium. Last episode of diarrhea was 8:30 am today.      HPI: Patient is a 75 y.o. female seen today for acute visit for diarrhea.   04/25 she presented to the ED for right upper abdominal pain. Labs unremarkable. CT abdomen noted scattered foci of aortic and right common iliac artery atherosclerosis, hepatic steatosis, and  intrahepatic and extrahepatic biliary duct dilation, gallbadder absent. No abscess in abdomen or pelvis, no complication s/p gastric bypass, no renal/ureteral calculus, no hydronephrosis. Her pain resolved with pain medication. She was found to have possible UTI, nitrofuranitoin prescribed.   04/29 she presented to PCP for nausea and vomiting. Abdominal pain noted when she threw up. Suspected to have acute gastritis,she was advised to continue a clear liquid diet and slowly transition to bland foods.   Since her last visit, she reports diarrhea began a few days after PCP visit. Diarrhea occurring daily, few episodes per day. Diarrhea described as "chocolate water." Admits to history of c.diff in past. Reports having these symptoms November 2021 to January 2022. Presented to the ED with dehydration, she was given flagyl, c.diff negative. She has tried to eat more solid foods, but every time she does, it precipitates diarrhea. She has taken imodium daily, sometimes two doses per day. She is also taking dexilant and probiotic daily. She is not taking sucralfate at this time. She has been drinking mostly fluids in an effort to prevent dehydration. Abdominal pain varies. Pain located in upper mid abdomen, rated 5/10 at times. She denies nausea, vomiting or constipation at this time.   Requesting probiotic prescription.      Review of Systems:  Review of Systems  Constitutional: Positive for malaise/fatigue. Negative for chills, fever and weight loss.  Respiratory: Negative for cough, shortness of breath and wheezing.   Cardiovascular: Negative for chest pain and palpitations.  Gastrointestinal: Positive for abdominal pain and diarrhea. Negative for  blood in stool, constipation, heartburn, melena, nausea and vomiting.  Psychiatric/Behavioral: Negative for depression. The patient is not nervous/anxious.     Past Medical History:  Diagnosis Date  . Allergy   . Anemia   . Arachnoiditis   . Bipolar  disorder ([place])   . Blood transfusion without reported diagnosis   . Cataract    removed  . Chronic back pain   . Chronic pain   . Chronic post-thoracotomy pain   . CKD (chronic kidney disease)   . GERD (gastroesophageal reflux disease)   . Hypogammaglobulinemia ([place])   . Hypotension   . Hypothyroid   . 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 Lung cancer ([place]) 2011, 2014  . Memory loss   . Migraine   . Opioid abuse ([place])   . Osteoporosis   . Presence of neurostimulator   . Recurrent upper respiratory infection (URI)   . Restless legs   . Sleep apnea   . Tremor   . Urge incontinence   . Urticaria    long time ago, nothing recent   Past Surgical History:  Procedure Laterality Date  . ABDOMINAL HYSTERECTOMY    . CARPAL TUNNEL RELEASE    . CATARACT EXTRACTION Bilateral 2019  . CHOLECYSTECTOMY    . COLONOSCOPY  2015   [facility]  . CT LUNG SCREENING  2018  . DG  BONE DENSITY ([place] HX)  2018  . DIAGNOSTIC MAMMOGRAM  2019  . GASTRIC BYPASS    . LUNG CANCER SURGERY  02/2010  . MULTIPLE TOOTH EXTRACTIONS    . SPINAL CORD STIMULATOR IMPLANT     Social History:   reports that she has never smoked. She has never used smokeless tobacco. She reports previous alcohol use. She reports that she does not use drugs.  Family History  Problem Relation Age of Onset  . Hypertension Mother   . GER disease Mother   . Dementia Mother   . Osteoporosis Mother   . Arthritis Mother   . Bipolar disorder Mother   . Parkinson's disease Father   . Congestive Heart Failure Father   . Pneumonia Father   . Arthritis Father   . Dementia Father   . Depression Father   . Asthma Sister   . Depression Sister   . Bipolar disorder Brother   . Asthma Daughter   . Colon cancer Neg Hx   . Esophageal cancer Neg Hx   . Stomach cancer Neg Hx   . Rectal cancer Neg Hx     Medications: Patient's Medications  New Prescriptions   No medications on file  Previous Medications   ACETAMINOPHEN (TYLENOL)  500 MG TABLET    Take 1-2 tablets (500-1,000 mg total) by mouth every 8 (eight) hours as needed (Max dose 3000 mg in 24 hours).   AZELASTINE-FLUTICASONE 137-50 MCG/ACT SUSP    ONE SPRAY EACH NOSTRIL TWICE A DAY AS NEEDED   BACLOFEN (LIORESAL) 10 MG TABLET    Take 10 mg by mouth every 28 (twenty-eight) days.   BUPRENORPHINE HCL-NALOXONE HCL 4-1 MG FILM    Place 1 strip under the tongue 2 (two) times daily.   CALCIUM CITRATE 200 MG TABS    Take 2 tablets by mouth daily.   CYCLOSPORINE (RESTASIS) 0.05 % OPHTHALMIC EMULSION    Place 1 drop into both eyes 2 (two) times daily.   DEXLANSOPRAZOLE (DEXILANT) 60 MG CAPSULE    Take 1 capsule (60 mg total) by mouth daily. Crack open the capsule and pour  into spoon prior to ingestion   ERENUMAB-AOOE (AIMOVIG, 140 MG DOSE,) 70 MG/ML SOAJ    Inject into the skin every 30 (thirty) days.   ESTRADIOL (ESTRACE) 0.1 MG/GM VAGINAL CREAM    3 (three) times a week.   FERROUS SULFATE 325 (65 FE) MG TABLET    Take 325 mg by mouth 2 (two) times daily with a meal.   HYDROXYZINE (VISTARIL) 25 MG CAPSULE    TAKE ONE CAPSULE BY MOUTH TWICE DAILY AS NEEDED   IBUPROFEN (ADVIL) 200 MG TABLET    Take 200 mg by mouth every 6 (six) hours as needed.   IMMUNE GLOBULIN, HUMAN, (GAMMAGARD S/D LESS IGA) 10 G INJECTION    Inject into the vein every 21 ( twenty-one) days.    LAMOTRIGINE (LAMICTAL) 200 MG TABLET    TAKE 1 TABLET BY MOUTH DAILY   LASTACAFT 0.25 % SOLN    Apply 1 drop to eye daily.   LEVOTHYROXINE (SYNTHROID) 75 MCG TABLET    Take one tablet by mouth once daily 30 minutes before breakfast on empty stomach.   MAGNESIUM OXIDE (MAG-OX) 400 MG TABLET    Take 400 mg by mouth daily.    POTASSIUM CHLORIDE SA (KLOR-CON) 20 MEQ TABLET    TAKE TWO TABLETS BY MOUTH DAILY (MAY DISSOLVE IN WATER)   ROPINIROLE (REQUIP) 1 MG TABLET    TAKE 1 TABLET BY MOUTH AT BEDTIME   SUCRALFATE (CARAFATE) 1 GM/10ML SUSPENSION    Take 2 tsp by mouth three times daily as needed.   TIZANIDINE (ZANAFLEX) 4  MG TABLET    Take 1 tablet (4 mg total) by mouth every 6 (six) hours as needed for muscle spasms.   TORSEMIDE (DEMADEX) 20 MG TABLET    TAKE 1 TABLET BY MOUTH DAILY   TRIAMCINOLONE CREAM (KENALOG) 0.1 %    Apply 1 application topically 2 (two) times daily as needed.   UNABLE TO FIND    Med Name: [name] 1 by mouth daily for UTI prevention   ZINC OXIDE, TOPICAL, 10 % CREA    Apply 1 application topically in the morning and at bedtime.  Modified Medications   No medications on file  Discontinued Medications   LIDOCAINE (LIDODERM) 5 %    Place 1 patch onto the skin daily. Remove & Discard patch within 12 hours or as directed by MD   NITROFURANTOIN, MACROCRYSTAL-MONOHYDRATE, (MACROBID) 100 MG CAPSULE    Take 1 capsule (100 mg total) by mouth 2 (two) times daily.    Physical Exam:  Vitals:   01/02/21 1502  BP: 122/78  Pulse: 79  Temp: (!) 96.8 F (36 C)  TempSrc: Temporal  SpO2: 99%  Weight: 117 lb 12.8 oz (53.4 kg)  Height: 5\' 3"  (1.6 m)   Body mass index is 20.87 kg/m. Wt Readings from Last 3 Encounters:  01/02/21 117 lb 12.8 oz (53.4 kg)  12/13/20 116 lb 3.2 oz (52.7 kg)  12/09/20 115 lb (52.2 kg)    Physical Exam Vitals reviewed.  Constitutional:      General: She is not in acute distress. HENT:     Head: Normocephalic.  Cardiovascular:     Rate and Rhythm: Normal rate and regular rhythm.     Pulses: Normal pulses.     Heart sounds: Normal heart sounds.  Pulmonary:     Effort: Pulmonary effort is normal. No respiratory distress.     Breath sounds: Normal breath sounds. No wheezing.  Abdominal:     General: Abdomen  is flat. Bowel sounds are normal. There is no distension.     Palpations: Abdomen is soft. There is no mass.     Tenderness: There is abdominal tenderness.  Musculoskeletal:     Right lower leg: No edema.     Left lower leg: No edema.  Skin:    General: Skin is warm and dry.     Capillary Refill: Capillary refill takes less than 2 seconds.   Neurological:     General: No focal deficit present.     Mental Status: She is alert and oriented to person, place, and time.  Psychiatric:        Mood and Affect: Mood normal.        Behavior: Behavior normal.     Labs reviewed: Basic Metabolic Panel: Recent Labs    06/10/20 1545 06/10/20 1549 08/28/20 1422 08/28/20 1426 11/24/20 1706 12/09/20 1138  NA  --    < >  --  134* 135 130*  K  --    < >  --  2.7* 4.3 4.2  CL  --    < >  --  92* 104 97*  CO2  --    < >  --  29 25 22   GLUCOSE  --    < >  --  119* 122* 99  BUN  --    < >  --  13 28* 21  CREATININE  --    < >  --  0.89 0.91 1.03*  CALCIUM  --    < >  --  9.4 8.1* 8.0*  MG  --   --  1.3*  --   --   --   TSH 5.05*  --   --   --   --   --    < > = values in this interval not displayed.   Liver Function Tests: Recent Labs    06/10/20 1549 08/28/20 1426 12/09/20 1138  AST 29 41 27  ALT 19 25 17   ALKPHOS  --  45 48  BILITOT 0.2 0.5 0.6  PROT 7.0 7.5 6.5  ALBUMIN  --  4.2 3.1*   Recent Labs    08/28/20 1426 12/09/20 1138  LIPASE 46 32   No results for input(s): AMMONIA in the last 8760 hours. CBC: Recent Labs    08/28/20 1426 11/24/20 1706 12/09/20 1138  WBC 8.4 6.1 4.5  NEUTROABS 6.5 3.9 2.9  HGB 15.5* 12.4 12.6  HCT 44.4 36.6 38.0  MCV 88.1 89.7 89.6  PLT 240 218 177   Lipid Panel: No results for input(s): CHOL, HDL, LDLCALC, TRIG, CHOLHDL, LDLDIRECT in the last 8760 hours. TSH: Recent Labs    06/10/20 1545  TSH 5.05*   A1C: No results found for: HGBA1C   Assessment/Plan 1. Diarrhea, unspecified type - history with diarrhea 08/2020- c.diff negative- resolved with Flagyl - diarrhea x 2 weeks, intermittent abdominal pain, no weight loss from last visit - tenderness noted over RUQ  - Ambulatory referral to Gastroenterology - CBC with Differential/Platelets - CMP - Clostridium difficile culture-fecal- patient will bring sample to office in AM - saccharomyces boulardii (FLORASTOR) 250  MG capsule; Take 1 capsule (250 mg total) by mouth 2 (two) times daily.  Dispense: 30 capsule; Refill: 3 - cont oral hydration - cont BRAT diet -cont dexilant - cont OTC imodium  - advised to report to ED if she is unable to keep fluids down or abdominal pain increases  2. Right upper  quadrant abdominal pain - see above - Ambulatory referral to Gastroenterology- used [facility] in past  Total time: 35 minutes. Greater than 50% of total time spent doing patient education and coordination of care regarding dehydration prevention and symptom management.   Next appt: Visit date not found [place], [facility] [phone]

## 2021-01-02 NOTE — Patient Instructions (Addendum)
Take probiotic daily Try bland foods with fiber in them  Please bring stool sample to office   Diarrhea, Adult Diarrhea is when you pass loose and watery poop (stool) often. Diarrhea can make you feel weak and cause you to lose water in your body (get dehydrated). Losing water in your body can cause you to:  Feel tired and thirsty.  Have a dry mouth.  Go pee (urinate) less often. Diarrhea often lasts 2-3 days. However, it can last longer if it is a sign of something more serious. It is important to treat your diarrhea as told by your doctor. Follow these instructions at home: Eating and drinking Follow these instructions as told by your doctor:  Take an ORS (oral rehydration solution). This is a drink that helps you replace fluids and minerals your body lost. It is sold at pharmacies and stores.  Drink plenty of fluids, such as: ? Water. ? Ice chips. ? Diluted fruit juice. ? Low-calorie sports drinks. ? Milk, if you want.  Avoid drinking fluids that have a lot of sugar or caffeine in them.  Eat bland, easy-to-digest foods in small amounts as you are able. These foods include: ? Bananas. ? Applesauce. ? Rice. ? Low-fat (lean) meats. ? Toast. ? Crackers.  Avoid alcohol.  Avoid spicy or fatty foods.      Medicines  Take over-the-counter and prescription medicines only as told by your doctor.  If you were prescribed an antibiotic medicine, take it as told by your doctor. Do not stop using the antibiotic even if you start to feel better. General instructions  Wash your hands often using soap and water. If soap and water are not available, use a hand sanitizer. Others in your home should wash their hands as well. Hands should be washed: ? After using the toilet or changing a diaper. ? Before preparing, cooking, or serving food. ? While caring for a sick person. ? While visiting someone in a hospital.  Drink enough fluid to keep your pee (urine) pale yellow.  Rest at  home while you get better.  Watch your condition for any changes.  Take a warm bath to help with any burning or pain from having diarrhea.  Keep all follow-up visits as told by your doctor. This is important.   Contact a doctor if:  You have a fever.  Your diarrhea gets worse.  You have new symptoms.  You cannot keep fluids down.  You feel light-headed or dizzy.  You have a headache.  You have muscle cramps. Get help right away if:  You have chest pain.  You feel very weak or you pass out (faint).  You have bloody or black poop or poop that looks like tar.  You have very bad pain, cramping, or bloating in your belly (abdomen).  You have trouble breathing or you are breathing very quickly.  Your heart is beating very quickly.  Your skin feels cold and clammy.  You feel confused.  You have signs of losing too much water in your body, such as: ? Dark pee, very little pee, or no pee. ? Cracked lips. ? Dry mouth. ? Sunken eyes. ? Sleepiness. ? Weakness. Summary  Diarrhea is when you pass loose and watery poop (stool) often.  Diarrhea can make you feel weak and cause you to lose water in your body (get dehydrated).  Take an ORS (oral rehydration solution). This is a drink that is sold at pharmacies and stores.  Eat bland, easy-to-digest  foods in small amounts as you are able.  Contact a doctor if your condition gets worse. Get help right away if you have signs that you have lost too much water in your body. This information is not intended to replace advice given to you by your health care provider. Make sure you discuss any questions you have with your health care provider. Document Revised: 01/07/2018 Document Reviewed: 01/07/2018 Elsevier Patient Education  2021 [employer].

## 2021-01-03 ENCOUNTER — Other Ambulatory Visit: Payer: Self-pay

## 2021-01-03 ENCOUNTER — Other Ambulatory Visit: Payer: Self-pay | Admitting: Psychiatry

## 2021-01-03 DIAGNOSIS — F99 Mental disorder, not otherwise specified: Secondary | ICD-10-CM

## 2021-01-03 DIAGNOSIS — F419 Anxiety disorder, unspecified: Secondary | ICD-10-CM

## 2021-01-03 DIAGNOSIS — F5105 Insomnia due to other mental disorder: Secondary | ICD-10-CM

## 2021-01-03 LAB — CBC WITH DIFFERENTIAL/PLATELET
Absolute Monocytes: 442 cells/uL (ref 200–950)
Basophils Absolute: 50 cells/uL (ref 0–200)
Basophils Relative: 0.9 %
Eosinophils Absolute: 22 cells/uL (ref 15–500)
Eosinophils Relative: 0.4 %
HCT: 40.3 % (ref 35.0–45.0)
Hemoglobin: 13.2 g/dL (ref 11.7–15.5)
Lymphs Abs: 1495 cells/uL (ref 850–3900)
MCH: 29.6 pg (ref 27.0–33.0)
MCHC: 32.8 g/dL (ref 32.0–36.0)
MCV: 90.4 fL (ref 80.0–100.0)
MPV: 12.6 fL — ABNORMAL HIGH (ref 7.5–12.5)
Monocytes Relative: 7.9 %
Neutro Abs: 3590 cells/uL (ref 1500–7800)
Neutrophils Relative %: 64.1 %
Platelets: 136 10*3/uL — ABNORMAL LOW (ref 140–400)
RBC: 4.46 10*6/uL (ref 3.80–5.10)
RDW: 13 % (ref 11.0–15.0)
Total Lymphocyte: 26.7 %
WBC: 5.6 10*3/uL (ref 3.8–10.8)

## 2021-01-03 LAB — COMPREHENSIVE METABOLIC PANEL
AG Ratio: 1.1 (calc) (ref 1.0–2.5)
ALT: 17 U/L (ref 6–29)
AST: 26 U/L (ref 10–35)
Albumin: 3.4 g/dL — ABNORMAL LOW (ref 3.6–5.1)
Alkaline phosphatase (APISO): 52 U/L (ref 37–153)
BUN/Creatinine Ratio: 23 (calc) — ABNORMAL HIGH (ref 6–22)
BUN: 22 mg/dL (ref 7–25)
CO2: 21 mmol/L (ref 20–32)
Calcium: 8.6 mg/dL (ref 8.6–10.4)
Chloride: 106 mmol/L (ref 98–110)
Creat: 0.97 mg/dL — ABNORMAL HIGH (ref 0.60–0.93)
Globulin: 3.1 g/dL (calc) (ref 1.9–3.7)
Glucose, Bld: 96 mg/dL (ref 65–139)
Potassium: 4.4 mmol/L (ref 3.5–5.3)
Sodium: 139 mmol/L (ref 135–146)
Total Bilirubin: 0.4 mg/dL (ref 0.2–1.2)
Total Protein: 6.5 g/dL (ref 6.1–8.1)

## 2021-01-03 MED ORDER — DEXLANSOPRAZOLE 60 MG PO CPDR: 60.0000 mg | DELAYED_RELEASE_CAPSULE | Freq: Every day | ORAL | 0 refills | Status: DC

## 2021-01-06 NOTE — Telephone Encounter (Signed)
Patient called in today regarding her recent lab work. She states she doesn't understand what the numbers mean. [facility] [place]

## 2021-01-06 NOTE — Telephone Encounter (Signed)
Pt has apt 5/26 but she's asking about her labs?

## 2021-01-07 ENCOUNTER — Telehealth: Payer: Self-pay | Admitting: Gastroenterology

## 2021-01-07 DIAGNOSIS — R197 Diarrhea, unspecified: Secondary | ICD-10-CM

## 2021-01-07 NOTE — Telephone Encounter (Signed)
Spoke with patient, she reports watery diarrhea for 4 weeks, some soft solids in it. Reports that she has lost 5 lbs over 4 weeks. Patient states that she is eating yogurt and 1 fiber bar, that's all she can really eat. She states that she is trying to stay hydrated with consomme, water, and tea. Advised patient that she needs something with electrolytes such as Gatorade or Pedialyte, advised that she can also supplement with Boost or Ensure. She states that she is taking 4 Imodium tablets daily, she states that this has slowed diarrhea down a little. She has about 4-5 episodes of diarrhea for 30 minutes at a time. No fever, or blood. Reports a sharp pain from diaphragm down. She is on Florastor BID and no fiber supplement. Patient is seeking further advise until her appt with Amy on 01/29/21. Please advise, thanks.

## 2021-01-07 NOTE — Telephone Encounter (Signed)
Inbound call from patient. States she has had severe diarrhea for 4 weeks, is losing weight. She have been using imodium for 3 weeks not really working. I have scheduled her an appointment for 6/15 with Amy. Patient is asking if there is anything else she can do till then. Best contact number [phone]

## 2021-01-07 NOTE — Telephone Encounter (Signed)
I have not ordered any recent labs on her. I will be glad to review her labs with her during apt on 5/26

## 2021-01-07 NOTE — Telephone Encounter (Signed)
Spoke with patient in regards to recommendations. Patient states that she was seen at [facility] last week and a C. Difficile test was ordered, she states that her husband dropped off the stool sample on Friday. Advised that I will check with [place], NP office in regards to results. Advised that I will send her a low FODMAP diet to her My Chart. She states that her husband has access to her chart.   Lm on vm for clinical staff to return call in regards to patient's C. Difficile results. Will await return call.

## 2021-01-07 NOTE — Telephone Encounter (Signed)
Looks like she had a GI pathogen panel that was negative in January.  I would ask her to go back to the lab for C. difficile PCR to make sure negative if the symptoms persist.  We can provide her some information on a low FODMAP diet as well to see if she is eating anything that could contribute to this.  We will await stool study first prior to making further recommendations.  Thanks

## 2021-01-08 NOTE — Progress Notes (Signed)
Per Hassan Rowan [employer]), specimen was received and results are still in process (pending)

## 2021-01-09 ENCOUNTER — Telehealth (INDEPENDENT_AMBULATORY_CARE_PROVIDER_SITE_OTHER): Payer: Medicare PPO | Admitting: Psychiatry

## 2021-01-09 ENCOUNTER — Encounter: Payer: Self-pay | Admitting: Psychiatry

## 2021-01-09 VITALS — Wt 115.0 lb

## 2021-01-09 DIAGNOSIS — F419 Anxiety disorder, unspecified: Secondary | ICD-10-CM | POA: Diagnosis not present

## 2021-01-09 DIAGNOSIS — F5105 Insomnia due to other mental disorder: Secondary | ICD-10-CM

## 2021-01-09 DIAGNOSIS — F99 Mental disorder, not otherwise specified: Secondary | ICD-10-CM | POA: Diagnosis not present

## 2021-01-09 LAB — CLOSTRIDIUM DIFFICILE CULTURE-FECAL

## 2021-01-09 MED ORDER — MIRTAZAPINE 30 MG PO TABS: ORAL_TABLET | ORAL | 2 refills | Status: DC

## 2021-01-09 NOTE — Progress Notes (Signed)
Megan Brewer [id number] 1945/10/18 75 y.o.  Virtual Visit via Telephone Note  I connected with pt on 01/09/21 at 10:30 AM EDT by telephone and verified that I am speaking with the correct person using two identifiers.   I discussed the limitations, risks, security and privacy concerns of performing an evaluation and management service by telephone and the availability of in person appointments. I also discussed with the patient that there may be a patient responsible charge related to this service. The patient expressed understanding and agreed to proceed.   I discussed the assessment and treatment plan with the patient. The patient was provided an opportunity to ask questions and all were answered. The patient agreed with the plan and demonstrated an understanding of the instructions.   The patient was advised to call back or seek an in-person evaluation if the symptoms worsen or if the condition fails to improve as anticipated.  I provided 25 minutes of non-face-to-face time during this encounter.  The patient was located at home.  The provider was located at [place].   Thayer Headings, PMHNP   Subjective:   Patient ID:  Megan Brewer is a 75 y.o. (DOB 1945/11/30) female.  Chief Complaint:  Chief Complaint  Patient presents with  . Anxiety  . Insomnia  . Depression    HPI Megan Brewer presents for follow-up of anxiety, depression, and sleep disturbance.  She has had gastroenteritis for 4 weeks and has lost 5 lbs. She had similar s/s in November and has lost a total of 30 lbs. She reports no appetite and abdominal spasms.  Awaiting GI consult. She reports that she has not had Buprenorphine-Naloxone in 5 weeks.   She reports low energy. She reports that she is only sleeping 1-2 hours a night and was sleeping 4 hours a night with Remeron 15 mg po QHS. She reports that she has been without Remeron for a week. She reports that it was helping "some" with  depression and anxiety. She reports that she had less panic attacks with Remeron. She reports having 2-3 panic attacks recently. She asks about increasing Remeron. She reports that when she was on Remeron before she could laugh and cry. She reports poor concentration. Denies SI.   She reports that TD "seems better than when I was on Ingrezza." She reports that movements are less intense. Reports TD affecting hands and legs.   Past Psychiatric Medication Trials: Sertraline- Was effective for about 25 years and no longer seems to be as effective.  Prozac Paxil  Cymbalta- may have caused psychosis. Remeron-Helpful for her mood and caused her legs to give out.  Depakote Trileptal- shaking and night sweats Gabapentin- Had throat swelling Lithium- Edema. Minimally effective Seroquel Risperidone Zyprexa- Effective for insomnia, anxiety, and mood. Excessive wt gain. Saphris Lamotrigine Melatonin Ativan- Reports taking until 2 months ago.  Valium- "great for my depression." Xanax Trazodone- Ineffective Requip- Took for years at 0.5 mg po qd. Ingrezza- multiple side effects  Review of Systems:  Review of Systems  Gastrointestinal: Positive for diarrhea and nausea.  Musculoskeletal: Negative for gait problem.  Neurological: Positive for dizziness, tremors and weakness.  Psychiatric/Behavioral:       Please refer to HPI    Medications: I have reviewed the patient's current medications.  Current Outpatient Medications  Medication Sig Dispense Refill  . acetaminophen (TYLENOL) 500 MG tablet Take 1-2 tablets (500-1,000 mg total) by mouth every 8 (eight) hours as needed (Max dose 3000 mg in 24  hours).    . Azelastine-Fluticasone 137-50 MCG/ACT SUSP ONE SPRAY EACH NOSTRIL TWICE A DAY AS NEEDED 23 g 0  . baclofen (LIORESAL) 10 MG tablet Take 10 mg by mouth every 28 (twenty-eight) days.    . Calcium Citrate 200 MG TABS Take 2 tablets by mouth daily.    . cycloSPORINE (RESTASIS) 0.05 %  ophthalmic emulsion Place 1 drop into both eyes 2 (two) times daily.    Marland Kitchen dexlansoprazole (DEXILANT) 60 MG capsule Take 1 capsule (60 mg total) by mouth daily. Crack open the capsule and pour into spoon prior to ingestion. PLEASE SCHEDULE AN OFFICE VISIT FOR FURTHER REFILLS. Thank you 30 capsule 0  . Erenumab-aooe (AIMOVIG, 140 MG DOSE,) 70 MG/ML SOAJ Inject into the skin every 30 (thirty) days.    Marland Kitchen estradiol (ESTRACE) 0.1 MG/GM vaginal cream 3 (three) times a week.    . ferrous sulfate 325 (65 FE) MG tablet Take 325 mg by mouth 2 (two) times daily with a meal.    . hydrOXYzine (VISTARIL) 25 MG capsule TAKE ONE CAPSULE BY MOUTH TWICE DAILY AS NEEDED 30 capsule 0  . ibuprofen (ADVIL) 200 MG tablet Take 200 mg by mouth every 6 (six) hours as needed.    . immune globulin, human, (GAMMAGARD S/D LESS IGA) 10 g injection Inject into the vein every 21 ( twenty-one) days.     Marland Kitchen lamoTRIgine (LAMICTAL) 200 MG tablet TAKE 1 TABLET BY MOUTH DAILY 30 tablet 2  . levothyroxine (SYNTHROID) 75 MCG tablet Take one tablet by mouth once daily 30 minutes before breakfast on empty stomach. 90 tablet 1  . magnesium oxide (MAG-OX) 400 MG tablet Take 400 mg by mouth daily.     . mirtazapine (REMERON) 30 MG tablet Take 1/2 tablet at bedtime for one week, then increase to 1 tablet at bedtime 30 tablet 2  . Multiple Vitamins-Minerals (MEGAVITE FRUITS & VEGGIES PO) Take by mouth.    . potassium chloride SA (KLOR-CON) 20 MEQ tablet TAKE TWO TABLETS BY MOUTH DAILY (MAY DISSOLVE IN WATER) 180 tablet 1  . rOPINIRole (REQUIP) 1 MG tablet TAKE 1 TABLET BY MOUTH AT BEDTIME 90 tablet 0  . saccharomyces boulardii (FLORASTOR) 250 MG capsule Take 1 capsule (250 mg total) by mouth 2 (two) times daily. 30 capsule 3  . sucralfate (CARAFATE) 1 GM/10ML suspension Take 2 tsp by mouth three times daily as needed. 420 mL 1  . tiZANidine (ZANAFLEX) 4 MG tablet Take 1 tablet (4 mg total) by mouth every 6 (six) hours as needed for muscle spasms.  10 tablet 0  . torsemide (DEMADEX) 20 MG tablet TAKE 1 TABLET BY MOUTH DAILY 90 tablet 1  . triamcinolone cream (KENALOG) 0.1 % Apply 1 application topically 2 (two) times daily as needed.    Marland Kitchen UNABLE TO FIND Med Name: [name] 1 by mouth daily for UTI prevention    . Buprenorphine HCl-Naloxone HCl 4-1 MG FILM Place 1 strip under the tongue 2 (two) times daily. (Patient not taking: No sig reported)    . LASTACAFT 0.25 % SOLN Apply 1 drop to eye daily.    Marland Kitchen ZINC OXIDE, TOPICAL, 10 % CREA Apply 1 application topically in the morning and at bedtime. 113 g 3   No current facility-administered medications for this visit.    Medication Side Effects: None  Allergies:  Allergies  Allergen Reactions  . Tetracycline Nausea Only    Causes ringing in ears, dizziness, migraine, nausea  . Corticosteroids Other (See Comments)  12/02/2016 interview by Melissa Montane: Oral dosing causes migraines/nausea/tinnitus. Prev tolerated IV and intranasal admin w/o difficulty.  . Cefixime Other (See Comments)    Headache   . Ciprofloxacin Other (See Comments)    Headache, dizziness, ringing in the ears  . Doxycycline Other (See Comments)    unknown  . Gabapentin     Throat swells/closes  . Isometheptene-Dichloral-Apap Other (See Comments)    unknown  . Ketorolac     12/02/2016 interview by QIW: Oral dosing prednisone/corticosteroids causes migraines/nausea/tinnitus. Prev tolerated IV and intranasal admin w/o difficulty.  . Prednisone Other (See Comments)    Migraine, dizzy, ringing in ears-can take it IV 12/02/2016 interview by [name]: Oral prednisone dosing causes migraines/nausea/tinnitus. Prev tolerated IV and intranasal admin w/o difficulty.  . Promethazine Other (See Comments)    causes severe abdominal pain when taken IV; however she can take PO or IM  . Stadol [Butorphanol]     Cerebral pain  . Erythromycin Base Diarrhea and Itching    Severe diarrhea  . Propoxyphene Nausea Only    Past Medical History:   Diagnosis Date  . Allergy   . Anemia   . Arachnoiditis   . Bipolar disorder ([place])   . Blood transfusion without reported diagnosis   . Cataract    removed  . Chronic back pain   . Chronic pain   . Chronic post-thoracotomy pain   . CKD (chronic kidney disease)   . GERD (gastroesophageal reflux disease)   . Hypogammaglobulinemia ([place])   . Hypotension   . Hypothyroid   . 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 Lung cancer ([place]) 2011, 2014  . Memory loss   . Migraine   . Opioid abuse ([place])   . Osteoporosis   . Presence of neurostimulator   . Recurrent upper respiratory infection (URI)   . Restless legs   . Sleep apnea   . Tremor   . Urge incontinence   . Urticaria    long time ago, nothing recent    Family History  Problem Relation Age of Onset  . Hypertension Mother   . GER disease Mother   . Dementia Mother   . Osteoporosis Mother   . Arthritis Mother   . Bipolar disorder Mother   . Parkinson's disease Father   . Congestive Heart Failure Father   . Pneumonia Father   . Arthritis Father   . Dementia Father   . Depression Father   . Asthma Sister   . Depression Sister   . Bipolar disorder Brother   . Asthma Daughter   . Colon cancer Neg Hx   . Esophageal cancer Neg Hx   . Stomach cancer Neg Hx   . Rectal cancer Neg Hx     Social History   Socioeconomic History  . Marital status: Married    Spouse name: Not on file  . Number of children: 2  . Years of education: Not on file  . Highest education level: GED or equivalent  Occupational History  . Occupation: retired  Tobacco Use  . Smoking status: Never Smoker  . Smokeless tobacco: Never Used  Vaping Use  . Vaping Use: Never used  Substance and Sexual Activity  . Alcohol use: Not Currently  . Drug use: Never  . Sexual activity: Not on file  Other Topics Concern  . Not on file  Social History Narrative   Diet: None      Caffeine: coffee, tea, sodas less than or 1 daily.  Married, if  yes what year: Yes, 1972      Do you live in a house, apartment, assisted living, condo, trailer, ect: Two story house       Pets: None      Current/Past profession: Teach      Exercise: None         Living Will: No   DNR: No   POA/HPOA: No      Functional Status:   Do you have difficulty bathing or dressing yourself? yes   Do you have difficulty preparing food or eating? yes   Do you have difficulty managing your medications? yes   Do you have difficulty managing your finances?yes   Do you have difficulty affording your medications? no   Social Determinants of Health   Financial Resource Strain: Not on file  Food Insecurity: Not on file  Transportation Needs: Not on file  Physical Activity: Not on file  Stress: Not on file  Social Connections: Not on file  Intimate Partner Violence: Not on file    Past Medical History, Surgical history, Social history, and Family history were reviewed and updated as appropriate.   Please see review of systems for further details on the patient's review from today.   Objective:   Physical Exam:  Wt 115 lb (52.2 kg)   BMI 20.37 kg/m   Physical Exam Neurological:     Mental Status: She is alert and oriented to person, place, and time.     Cranial Nerves: No dysarthria.  Psychiatric:        Attention and Perception: Attention and perception normal.        Mood and Affect: Mood is anxious and depressed.        Speech: Speech normal.        Behavior: Behavior is cooperative.        Thought Content: Thought content normal. Thought content is not paranoid or delusional. Thought content does not include homicidal or suicidal ideation. Thought content does not include homicidal or suicidal plan.        Cognition and Memory: Cognition and memory normal.        Judgment: Judgment normal.     Comments: Insight intact Dysphoric mood     Lab Review:     Component Value Date/Time   NA 139 01/02/2021 0000   K 4.4 01/02/2021 0000   CL  106 01/02/2021 0000   CO2 21 01/02/2021 0000   GLUCOSE 96 01/02/2021 0000   BUN 22 01/02/2021 0000   CREATININE 0.97 (H) 01/02/2021 0000   CALCIUM 8.6 01/02/2021 0000   PROT 6.5 01/02/2021 0000   ALBUMIN 3.1 (L) 12/09/2020 1138   AST 26 01/02/2021 0000   ALT 17 01/02/2021 0000   ALKPHOS 48 12/09/2020 1138   BILITOT 0.4 01/02/2021 0000   GFRNONAA 58 (L) 12/09/2020 1138   GFRNONAA 33 (L) 06/10/2020 1549   GFRAA 38 (L) 06/10/2020 1549       Component Value Date/Time   WBC 5.6 01/02/2021 0000   RBC 4.46 01/02/2021 0000   HGB 13.2 01/02/2021 0000   HCT 40.3 01/02/2021 0000   PLT 136 (L) 01/02/2021 0000   MCV 90.4 01/02/2021 0000   MCH 29.6 01/02/2021 0000   MCHC 32.8 01/02/2021 0000   RDW 13.0 01/02/2021 0000   LYMPHSABS 1,495 01/02/2021 0000   MONOABS 0.4 12/09/2020 1138   EOSABS 22 01/02/2021 0000   BASOSABS 50 01/02/2021 0000    No results found for: POCLITH, LITHIUM  No results found for: PHENYTOIN, PHENOBARB, VALPROATE, CBMZ   .res Assessment: Plan:    Patient seen for 25 minutes and time spent counseling the patient regarding treatment options.  She request to restart Remeron and to try a higher dose since she reports that this seemed to be effective for her insomnia and anxiety.  Will send in prescription for Remeron 30 mg 1/2 tablet at bedtime for 1 week, then increase to 1 tablet at bedtime. Patient reports that she multiple side effects with Ingrezza and prefers not to restart medication for tardive dyskinesia at this time. Patient to follow-up in 6 weeks or sooner if clinically indicated. Patient advised to contact office with any questions, adverse effects, or acute worsening in signs and symptoms.   Syreeta was seen today for anxiety, insomnia and depression.  Diagnoses and all orders for this visit:  Anxiety disorder, unspecified type -     mirtazapine (REMERON) 30 MG tablet; Take 1/2 tablet at bedtime for one week, then increase to 1 tablet at  bedtime  Insomnia due to other mental disorder -     mirtazapine (REMERON) 30 MG tablet; Take 1/2 tablet at bedtime for one week, then increase to 1 tablet at bedtime    Please see After Visit Summary for patient specific instructions.  Future Appointments  Date Time Provider [place]  01/23/2021  8:50 AM Narda Amber K, DO [facility] None  01/29/2021 11:00 AM Alfredia Ferguson, PA-C [facility]  02/25/2021 11:00 AM Thayer Headings, PMHNP [facility] None  04/16/2021  1:45 PM [facility] NURSE [facility] None  09/30/2021 11:00 AM [name], NP [facility] None    No orders of the defined types were placed in this encounter.     -------------------------------

## 2021-01-09 NOTE — Telephone Encounter (Signed)
Looks like it was ordered a "C diff culture" , but it really should be a PCR test. Not sure how long that culture will take or what kind of test that is, but if her symptoms are severe would recommend she come back for the PCR test at our lab

## 2021-01-09 NOTE — Telephone Encounter (Addendum)
Dr. Havery Moros, just an FYI. Thanks  Received a return call from [place] at [facility]. She states that C. Difficile stools study was sent to [facility] on Friday, 01/03/21. She states that once it has resulted the results should be in epic.

## 2021-01-09 NOTE — Addendum Note (Signed)
Addended by: Yevette Edwards on: 01/09/2021 12:14 PM   Modules accepted: Orders

## 2021-01-09 NOTE — Telephone Encounter (Addendum)
Lm on "clinical intake voicemail" in regards to patient's C. Diff results and to clarify is the sample had been sent out. Advised that Dr. Havery Moros is waiting on this result before he provides any further recommendations.

## 2021-01-09 NOTE — Telephone Encounter (Signed)
Spoke with patient in regards to further recommendations. Patient states that she has had C. Diff before and does not think that is it. She states that she will have her husband come pick up the kit. Patient had no concerns at the end of the call.

## 2021-01-11 ENCOUNTER — Emergency Department (HOSPITAL_BASED_OUTPATIENT_CLINIC_OR_DEPARTMENT_OTHER): Payer: Medicare PPO

## 2021-01-11 ENCOUNTER — Emergency Department (HOSPITAL_BASED_OUTPATIENT_CLINIC_OR_DEPARTMENT_OTHER)
Admission: EM | Admit: 2021-01-11 | Discharge: 2021-01-11 | Disposition: A | Discharge status: Home / Self Care | Payer: Medicare PPO | Attending: Emergency Medicine | Admitting: Emergency Medicine

## 2021-01-11 ENCOUNTER — Other Ambulatory Visit: Payer: Self-pay

## 2021-01-11 ENCOUNTER — Encounter (HOSPITAL_BASED_OUTPATIENT_CLINIC_OR_DEPARTMENT_OTHER): Payer: Self-pay

## 2021-01-11 DIAGNOSIS — R0602 Shortness of breath: Secondary | ICD-10-CM | POA: Insufficient documentation

## 2021-01-11 DIAGNOSIS — R2243 Localized swelling, mass and lump, lower limb, bilateral: Secondary | ICD-10-CM | POA: Insufficient documentation

## 2021-01-11 DIAGNOSIS — Z79899 Other long term (current) drug therapy: Secondary | ICD-10-CM | POA: Diagnosis not present

## 2021-01-11 DIAGNOSIS — Z85118 Personal history of other malignant neoplasm of bronchus and lung: Secondary | ICD-10-CM | POA: Insufficient documentation

## 2021-01-11 DIAGNOSIS — E039 Hypothyroidism, unspecified: Secondary | ICD-10-CM | POA: Insufficient documentation

## 2021-01-11 DIAGNOSIS — N189 Chronic kidney disease, unspecified: Secondary | ICD-10-CM | POA: Insufficient documentation

## 2021-01-11 DIAGNOSIS — K529 Noninfective gastroenteritis and colitis, unspecified: Secondary | ICD-10-CM | POA: Insufficient documentation

## 2021-01-11 DIAGNOSIS — R600 Localized edema: Secondary | ICD-10-CM

## 2021-01-11 LAB — CBC WITH DIFFERENTIAL/PLATELET
Abs Immature Granulocytes: 0.01 10*3/uL (ref 0.00–0.07)
Basophils Absolute: 0 10*3/uL (ref 0.0–0.1)
Basophils Relative: 1 %
Eosinophils Absolute: 0 10*3/uL (ref 0.0–0.5)
Eosinophils Relative: 1 %
HCT: 42.3 % (ref 36.0–46.0)
Hemoglobin: 13.7 g/dL (ref 12.0–15.0)
Immature Granulocytes: 0 %
Lymphocytes Relative: 23 %
Lymphs Abs: 1.1 10*3/uL (ref 0.7–4.0)
MCH: 29.4 pg (ref 26.0–34.0)
MCHC: 32.4 g/dL (ref 30.0–36.0)
MCV: 90.8 fL (ref 80.0–100.0)
Monocytes Absolute: 0.5 10*3/uL (ref 0.1–1.0)
Monocytes Relative: 10 %
Neutro Abs: 3.2 10*3/uL (ref 1.7–7.7)
Neutrophils Relative %: 65 %
Platelets: 165 10*3/uL (ref 150–400)
RBC: 4.66 MIL/uL (ref 3.87–5.11)
RDW: 13.3 % (ref 11.5–15.5)
WBC: 4.9 10*3/uL (ref 4.0–10.5)
nRBC: 0 % (ref 0.0–0.2)

## 2021-01-11 LAB — COMPREHENSIVE METABOLIC PANEL
ALT: 36 U/L (ref 0–44)
AST: 39 U/L (ref 15–41)
Albumin: 3.6 g/dL (ref 3.5–5.0)
Alkaline Phosphatase: 64 U/L (ref 38–126)
Anion gap: 9 (ref 5–15)
BUN: 23 mg/dL (ref 8–23)
CO2: 25 mmol/L (ref 22–32)
Calcium: 8.8 mg/dL — ABNORMAL LOW (ref 8.9–10.3)
Chloride: 101 mmol/L (ref 98–111)
Creatinine, Ser: 0.9 mg/dL (ref 0.44–1.00)
GFR, Estimated: >60 mL/min (ref >60)
Glucose, Bld: 102 mg/dL — ABNORMAL HIGH (ref 70–99)
Potassium: 3.9 mmol/L (ref 3.5–5.1)
Sodium: 135 mmol/L (ref 135–145)
Total Bilirubin: 0.5 mg/dL (ref 0.3–1.2)
Total Protein: 8 g/dL (ref 6.5–8.1)

## 2021-01-11 LAB — BRAIN NATRIURETIC PEPTIDE: B Natriuretic Peptide: 180.1 pg/mL — ABNORMAL HIGH (ref 0.0–100.0)

## 2021-01-11 MED ORDER — FUROSEMIDE 10 MG/ML IJ SOLN
40.0000 mg | Freq: Once | INTRAMUSCULAR | Status: AC
  Administered 2021-01-11: 40 mg via INTRAVENOUS
  Filled 2021-01-11: qty 4

## 2021-01-11 NOTE — ED Notes (Signed)
Patient transported to X-ray 

## 2021-01-11 NOTE — ED Triage Notes (Signed)
Since yesterday, started having bilateral leg swelling. States right is worse than left. C/o right calf swelling, sent her from [facility] to r/o DVT. Pain to right calf with palpation, denies pain with ambulation.

## 2021-01-11 NOTE — ED Provider Notes (Signed)
[facility] EMERGENCY DEPARTMENT Provider Note   CSN: [id number] Arrival date & time: 01/11/21  1016     History Chief Complaint  Patient presents with  . Leg Swelling    Megan Brewer is a 75 y.o. female.  Patient is a 75 year old female who has a history of chronic kidney disease, bipolar disorder, GERD, chronic back pain.  She presents with leg swelling.  She states she has had some recurrent episodes of gastroenteritis.  She had an episode November and December and then another 4-week episode last month.  She said that she is feeling better today and has felt better for the last few days.  However she has noticed during this time her legs have become more swelling.  She says she has chronic swelling to her lower extremities bilaterally and that the right one is always a little bit more swollen than the left 1.  However over the last few weeks her legs have become more swollen and she noticed a knot on her right lower leg yesterday.  She says it was a little more prominent last night but is improved today.  She was seen in urgent care and sent here for concerns of possible DVT.  She has some baseline shortness of breath but denies any change or worsening shortness of breath.  No chest pain.  No fevers.        Past Medical History:  Diagnosis Date  . Allergy   . Anemia   . Arachnoiditis   . Bipolar disorder ([place])   . Blood transfusion without reported diagnosis   . Cataract    removed  . Chronic back pain   . Chronic pain   . Chronic post-thoracotomy pain   . CKD (chronic kidney disease)   . GERD (gastroesophageal reflux disease)   . Hypogammaglobulinemia ([place])   . Hypotension   . Hypothyroid   . 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 Lung cancer ([place]) 2011, 2014  . Memory loss   . Migraine   . Opioid abuse ([place])   . Osteoporosis   . Presence of neurostimulator   . Recurrent upper respiratory infection (URI)   . Restless legs   . Sleep apnea    . Tremor   . Urge incontinence   . Urticaria    long time ago, nothing recent    Patient Active Problem List   Diagnosis Date Noted  . Chest discomfort 03/22/2019  . Weight loss 01/27/2019  . Acquired hypothyroidism 01/27/2019  . Esophagitis 01/27/2019  . Intractable migraine without status migrainosus 01/27/2019  . Osteoporosis 01/27/2019  . Chronic sinusitis 12/28/2018  . Food intolerance/GI symptoms 12/28/2018  . Acute maxillary sinusitis 12/28/2018  . Lower leg edema 12/20/2018  . Chronic pain of both shoulders 05/24/2018  . Bilateral leg edema 05/24/2018  . Chronic pain syndrome 03/31/2018  . Left bundle branch block 10/28/2017  . Palpitations 10/28/2017  . History of gastric bypass 05/21/2017  . Polypharmacy 02/11/2017  . Senile nuclear sclerosis 11/26/2016  . Chronic post-thoracotomy pain 04/09/2014  . Chronic kidney disease 04/09/2014  . Sleep disturbances 09/20/2012  . Restless legs syndrome 07/07/2012  . Hypertonicity of bladder 05/23/2012  . Essential tremor 04/01/2012  . Memory loss 04/01/2012  . Incomplete emptying of bladder 01/14/2012  . Urge incontinence 10/28/2011  . Tension type headache 06/10/2011  . Mixed urge and stress incontinence 05/28/2011  . Hypogammaglobulinemia ([place]) 06/22/2010  . Severe episode of recurrent major depressive disorder, without psychotic features ([place]) 06/22/2010  .  Hypothyroidism 02/12/2010  . 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 ARACHNOIDITIS 01/31/2010  . OBSTRUCTIVE SLEEP APNEA 01/31/2010  . Chronic rhinitis 01/31/2010  . Arachnoiditis 01/31/2010    Past Surgical History:  Procedure Laterality Date  . ABDOMINAL HYSTERECTOMY    . CARPAL TUNNEL RELEASE    . CATARACT EXTRACTION Bilateral 2019  . CHOLECYSTECTOMY    . COLONOSCOPY  2015   [facility]  . CT LUNG SCREENING  2018  . DG  BONE DENSITY ([place] [facility])  2018  . DIAGNOSTIC MAMMOGRAM  2019  . GASTRIC BYPASS    . LUNG CANCER SURGERY  02/2010  . MULTIPLE TOOTH  EXTRACTIONS    . SPINAL CORD STIMULATOR IMPLANT       OB History   No obstetric history on file.     Family History  Problem Relation Age of Onset  . Hypertension Mother   . GER disease Mother   . Dementia Mother   . Osteoporosis Mother   . Arthritis Mother   . Bipolar disorder Mother   . Parkinson's disease Father   . Congestive Heart Failure Father   . Pneumonia Father   . Arthritis Father   . Dementia Father   . Depression Father   . Asthma Sister   . Depression Sister   . Bipolar disorder Brother   . Asthma Daughter   . Colon cancer Neg Hx   . Esophageal cancer Neg Hx   . Stomach cancer Neg Hx   . Rectal cancer Neg Hx     Social History   Tobacco Use  . Smoking status: Never Smoker  . Smokeless tobacco: Never Used  Vaping Use  . Vaping Use: Never used  Substance Use Topics  . Alcohol use: Not Currently  . Drug use: Never    Home Medications Prior to Admission medications   Medication Sig Start Date End Date Taking? Authorizing Provider  acetaminophen (TYLENOL) 500 MG tablet Take 1-2 tablets (500-1,000 mg total) by mouth every 8 (eight) hours as needed (Max dose 3000 mg in 24 hours). 06/11/20   [name], NP  Azelastine-Fluticasone 137-50 MCG/ACT SUSP ONE SPRAY EACH NOSTRIL TWICE A DAY AS NEEDED 10/01/20   Ambs, Kathrine Cords, FNP  baclofen (LIORESAL) 10 MG tablet Take 10 mg by mouth every 28 (twenty-eight) days. 06/03/20   [provider]  Buprenorphine HCl-Naloxone HCl 4-1 MG FILM Place 1 strip under the tongue 2 (two) times daily. Patient not taking: No sig reported 10/31/19   [provider]  Calcium Citrate 200 MG TABS Take 2 tablets by mouth daily. 12/02/16   [provider]  cycloSPORINE (RESTASIS) 0.05 % ophthalmic emulsion Place 1 drop into both eyes 2 (two) times daily.    [provider]  dexlansoprazole (DEXILANT) 60 MG capsule Take 1 capsule (60 mg total) by mouth daily. Crack open the capsule and pour into  spoon prior to ingestion. PLEASE SCHEDULE AN OFFICE VISIT FOR FURTHER REFILLS. Thank you 01/03/21   Armbruster, Carlota Raspberry, MD  Erenumab-aooe (AIMOVIG, 140 MG DOSE,) 70 MG/ML SOAJ Inject into the skin every 30 (thirty) days.    [provider]  estradiol (ESTRACE) 0.1 MG/GM vaginal cream 3 (three) times a week. 01/12/20   [provider]  ferrous sulfate 325 (65 FE) MG tablet Take 325 mg by mouth 2 (two) times daily with a meal.    [provider]  hydrOXYzine (VISTARIL) 25 MG capsule TAKE ONE CAPSULE BY MOUTH TWICE DAILY AS NEEDED 01/07/21  Thayer Headings, PMHNP  ibuprofen (ADVIL) 200 MG tablet Take 200 mg by mouth every 6 (six) hours as needed.    [provider]  immune globulin, human, (GAMMAGARD S/D LESS IGA) 10 g injection Inject into the vein every 21 ( twenty-one) days.     [provider]  lamoTRIgine (LAMICTAL) 200 MG tablet TAKE 1 TABLET BY MOUTH DAILY 01/01/21   Thayer Headings, PMHNP  LASTACAFT 0.25 % SOLN Apply 1 drop to eye daily. 01/19/19   [provider]  levothyroxine (SYNTHROID) 75 MCG tablet Take one tablet by mouth once daily 30 minutes before breakfast on empty stomach. 08/21/20   [name], NP  magnesium oxide (MAG-OX) 400 MG tablet Take 400 mg by mouth daily.     [provider]  mirtazapine (REMERON) 30 MG tablet Take 1/2 tablet at bedtime for one week, then increase to 1 tablet at bedtime 01/09/21   Thayer Headings, PMHNP  Multiple Vitamins-Minerals (MEGAVITE FRUITS & VEGGIES PO) Take by mouth.    [provider]  potassium chloride SA (KLOR-CON) 20 MEQ tablet TAKE TWO TABLETS BY MOUTH DAILY (MAY DISSOLVE IN WATER) 06/11/20   [name], NP  rOPINIRole (REQUIP) 1 MG tablet TAKE 1 TABLET BY MOUTH AT BEDTIME 09/09/20   Patel, Donika K, DO  saccharomyces boulardii (FLORASTOR) 250 MG capsule Take 1 capsule (250 mg total) by mouth 2 (two) times daily. 01/02/21   Fargo, Amy E, NP  sucralfate (CARAFATE)  1 GM/10ML suspension Take 2 tsp by mouth three times daily as needed. 08/07/20   Ngetich, Dinah C, NP  tiZANidine (ZANAFLEX) 4 MG tablet Take 1 tablet (4 mg total) by mouth every 6 (six) hours as needed for muscle spasms. 11/24/20   Drenda Freeze, MD  torsemide (DEMADEX) 20 MG tablet TAKE 1 TABLET BY MOUTH DAILY 07/23/20   [name], NP  triamcinolone cream (KENALOG) 0.1 % Apply 1 application topically 2 (two) times daily as needed.    [provider]  UNABLE TO FIND Med Name: [name] 1 by mouth daily for UTI prevention    [provider]  ZINC OXIDE, TOPICAL, 10 % CREA Apply 1 application topically in the morning and at bedtime. 12/06/20   Ngetich, Dinah C, NP    Allergies    Tetracycline, Corticosteroids, Cefixime, Ciprofloxacin, Doxycycline, Gabapentin, Isometheptene-dichloral-apap, Ketorolac, Prednisone, Promethazine, Stadol [butorphanol], Erythromycin base, and Propoxyphene  Review of Systems   Review of Systems  Constitutional: Negative for chills, diaphoresis, fatigue and fever.  HENT: Negative for congestion, rhinorrhea and sneezing.   Eyes: Negative.   Respiratory: Positive for shortness of breath (At baseline). Negative for cough and chest tightness.   Cardiovascular: Positive for leg swelling. Negative for chest pain.  Gastrointestinal: Negative for abdominal pain, blood in stool, diarrhea, nausea and vomiting.  Genitourinary: Negative for difficulty urinating, flank pain, frequency and hematuria.  Musculoskeletal: Negative for arthralgias and back pain.  Skin: Negative for rash.  Neurological: Negative for dizziness, speech difficulty, weakness, numbness and headaches.    Physical Exam Updated Vital Signs BP (!) 154/65   Pulse 77   Temp 98.5 F (36.9 C) (Oral)   Resp 18   Ht 5\' 3"  (1.6 m)   Wt 55.8 kg   SpO2 100%   BMI 21.79 kg/m   Physical Exam Constitutional:      Appearance: She is well-developed.  HENT:     Head: Normocephalic and  atraumatic.  Eyes:     Pupils: Pupils are equal,  round, and reactive to light.  Cardiovascular:     Rate and Rhythm: Normal rate and regular rhythm.     Heart sounds: Normal heart sounds.  Pulmonary:     Effort: Pulmonary effort is normal. No respiratory distress.     Breath sounds: Normal breath sounds. No wheezing or rales.  Chest:     Chest wall: No tenderness.  Abdominal:     General: Bowel sounds are normal.     Palpations: Abdomen is soft.     Tenderness: There is no abdominal tenderness. There is no guarding or rebound.  Musculoskeletal:        General: Normal range of motion.     Cervical back: Normal range of motion and neck supple.     Comments: 2-3+ pitting edema to lower extremities bilaterally.  The right leg is slightly more swollen than the left leg.  There is a small area of tenderness and mild erythema to the right lower pretibial area.  There is no induration or fluctuance.  No warmth.  Pedal pulses are intact.  Lymphadenopathy:     Cervical: No cervical adenopathy.  Skin:    General: Skin is warm and dry.     Findings: No rash.  Neurological:     Mental Status: She is alert and oriented to person, place, and time.     ED Results / Procedures / Treatments   Labs (all labs ordered are listed, but only abnormal results are displayed) Labs Reviewed  COMPREHENSIVE METABOLIC PANEL - Abnormal; Notable for the following components:      Result Value   Glucose, Bld 102 (*)    Calcium 8.8 (*)    All other components within normal limits  BRAIN NATRIURETIC PEPTIDE - Abnormal; Notable for the following components:   B Natriuretic Peptide 180.1 (*)    All other components within normal limits  CBC WITH DIFFERENTIAL/PLATELET    EKG None  Radiology DG Chest 2 View  Result Date: 01/11/2021 CLINICAL DATA:  75 year old female with shortness of breath. EXAM: CHEST - 2 VIEW COMPARISON:  12/09/2020, 03/29/2019 FINDINGS: The mediastinal contours are within normal  limits. No cardiomegaly. Unchanged position of left chest wall subclavian Port-A-Cath with the tip in the right atrium. Similar appearing spinal cord stimulators from recent comparison chest CT. Trace right pleural effusion. No focal consolidations. No pneumothorax. No acute osseous abnormality. Cholecystectomy clips in the right upper quadrant. IMPRESSION: Trace right pleural effusion. Electronically Signed   By: Ruthann Cancer MD   On: 01/11/2021 11:59   US Venous Img Lower Right (DVT Study)  Result Date: 01/11/2021 CLINICAL DATA:  Right lower extremity swelling for 1 month. EXAM: RIGHT LOWER EXTREMITY VENOUS DOPPLER ULTRASOUND TECHNIQUE: Gray-scale sonography with compression, as well as color and duplex ultrasound, were performed to evaluate the deep venous system(s) from the level of the common femoral vein through the popliteal and proximal calf veins. COMPARISON:  None. FINDINGS: VENOUS Normal compressibility of the common femoral, superficial femoral, and popliteal veins, as well as the visualized calf veins. Visualized portions of profunda femoral vein and great saphenous vein unremarkable. No filling defects to suggest DVT on grayscale or color Doppler imaging. Doppler waveforms show normal direction of venous flow, normal respiratory plasticity and response to augmentation. Limited views of the contralateral common femoral vein are unremarkable. OTHER None. Limitations: none IMPRESSION: No evidence of a right lower extremity deep venous thrombosis. Electronically Signed   By: Lajean Manes M.D.   On: 01/11/2021 13:40  Procedures Procedures   Medications Ordered in ED Medications  furosemide (LASIX) injection 40 mg (40 mg Intravenous Given 01/11/21 1345)    ED Course  I have reviewed the triage vital signs and the nursing notes.  Pertinent labs & imaging results that were available during my care of the patient were reviewed by me and considered in my medical decision making (see chart  for details).    MDM Rules/Calculators/A&P                          Patient presents with an increase in her lower extremity edema.  Her right side is slightly more enlarged than her left side.  An ultrasound was performed which shows no evidence of DVT.  There is a very slight area of erythema in her right lower leg.  She actually says it looks better than it did yesterday.  I have a very low suspicion of cellulitis.  Its not warm.  It is very faint.  It may be because of the increased swelling.  At this point I would favor against putting her on antibiotics given her recent GI issues.  I did discuss this with her and she is amenable to this plan.  If it starts spreading or getting any worse, she will either return here or talk to her primary care doctor her labs are nonconcerning.  She was given a dose of Lasix in the ED.  She was encouraged to follow-up with her PCP this week for recheck. Final Clinical Impression(s) / ED Diagnoses Final diagnoses:  Bilateral lower extremity edema    Rx / DC Orders ED Discharge Orders    None       Malvin Johns, MD 01/11/21 1447

## 2021-01-11 NOTE — Discharge Instructions (Addendum)
Follow-up with your doctor for recheck this week.  Return here as needed if you have any worsening symptoms.

## 2021-01-11 NOTE — ED Notes (Signed)
Pt remains in US

## 2021-01-14 ENCOUNTER — Encounter (HOSPITAL_BASED_OUTPATIENT_CLINIC_OR_DEPARTMENT_OTHER): Payer: Self-pay

## 2021-01-14 ENCOUNTER — Observation Stay (HOSPITAL_COMMUNITY): Payer: Medicare PPO

## 2021-01-14 ENCOUNTER — Emergency Department (HOSPITAL_BASED_OUTPATIENT_CLINIC_OR_DEPARTMENT_OTHER): Payer: Medicare PPO

## 2021-01-14 ENCOUNTER — Other Ambulatory Visit: Payer: Self-pay

## 2021-01-14 ENCOUNTER — Other Ambulatory Visit: Payer: Medicare PPO

## 2021-01-14 ENCOUNTER — Inpatient Hospital Stay (HOSPITAL_BASED_OUTPATIENT_CLINIC_OR_DEPARTMENT_OTHER)
Admission: EM | Admit: 2021-01-14 | Discharge: 2021-01-22 | DRG: 871 | Disposition: A | Discharge status: Home Health | Payer: Medicare PPO | Attending: Internal Medicine | Admitting: Internal Medicine

## 2021-01-14 DIAGNOSIS — I447 Left bundle-branch block, unspecified: Secondary | ICD-10-CM | POA: Diagnosis present

## 2021-01-14 DIAGNOSIS — A419 Sepsis, unspecified organism: Secondary | ICD-10-CM

## 2021-01-14 DIAGNOSIS — D839 Common variable immunodeficiency, unspecified: Secondary | ICD-10-CM | POA: Diagnosis not present

## 2021-01-14 DIAGNOSIS — Z20822 Contact with and (suspected) exposure to covid-19: Secondary | ICD-10-CM | POA: Diagnosis not present

## 2021-01-14 DIAGNOSIS — E876 Hypokalemia: Secondary | ICD-10-CM | POA: Diagnosis present

## 2021-01-14 DIAGNOSIS — D6959 Other secondary thrombocytopenia: Secondary | ICD-10-CM | POA: Diagnosis present

## 2021-01-14 DIAGNOSIS — M549 Dorsalgia, unspecified: Secondary | ICD-10-CM | POA: Diagnosis present

## 2021-01-14 DIAGNOSIS — R778 Other specified abnormalities of plasma proteins: Secondary | ICD-10-CM | POA: Diagnosis present

## 2021-01-14 DIAGNOSIS — R6520 Severe sepsis without septic shock: Secondary | ICD-10-CM | POA: Diagnosis not present

## 2021-01-14 DIAGNOSIS — Z66 Do not resuscitate: Secondary | ICD-10-CM | POA: Diagnosis not present

## 2021-01-14 DIAGNOSIS — G2581 Restless legs syndrome: Secondary | ICD-10-CM | POA: Diagnosis not present

## 2021-01-14 DIAGNOSIS — R339 Retention of urine, unspecified: Secondary | ICD-10-CM | POA: Diagnosis not present

## 2021-01-14 DIAGNOSIS — Z79899 Other long term (current) drug therapy: Secondary | ICD-10-CM

## 2021-01-14 DIAGNOSIS — R6883 Chills (without fever): Secondary | ICD-10-CM | POA: Diagnosis not present

## 2021-01-14 DIAGNOSIS — Z8261 Family history of arthritis: Secondary | ICD-10-CM

## 2021-01-14 DIAGNOSIS — G894 Chronic pain syndrome: Secondary | ICD-10-CM | POA: Diagnosis not present

## 2021-01-14 DIAGNOSIS — G9341 Metabolic encephalopathy: Secondary | ICD-10-CM | POA: Diagnosis present

## 2021-01-14 DIAGNOSIS — A4181 Sepsis due to Enterococcus: Secondary | ICD-10-CM | POA: Diagnosis not present

## 2021-01-14 DIAGNOSIS — N1831 Chronic kidney disease, stage 3a: Secondary | ICD-10-CM | POA: Diagnosis not present

## 2021-01-14 DIAGNOSIS — R7989 Other specified abnormal findings of blood chemistry: Secondary | ICD-10-CM | POA: Diagnosis present

## 2021-01-14 DIAGNOSIS — G4733 Obstructive sleep apnea (adult) (pediatric): Secondary | ICD-10-CM | POA: Diagnosis not present

## 2021-01-14 DIAGNOSIS — N189 Chronic kidney disease, unspecified: Secondary | ICD-10-CM | POA: Diagnosis present

## 2021-01-14 DIAGNOSIS — E039 Hypothyroidism, unspecified: Secondary | ICD-10-CM | POA: Diagnosis present

## 2021-01-14 DIAGNOSIS — E860 Dehydration: Secondary | ICD-10-CM | POA: Diagnosis present

## 2021-01-14 DIAGNOSIS — Z888 Allergy status to other drugs, medicaments and biological substances status: Secondary | ICD-10-CM

## 2021-01-14 DIAGNOSIS — G43909 Migraine, unspecified, not intractable, without status migrainosus: Secondary | ICD-10-CM | POA: Diagnosis present

## 2021-01-14 DIAGNOSIS — N390 Urinary tract infection, site not specified: Secondary | ICD-10-CM | POA: Diagnosis not present

## 2021-01-14 DIAGNOSIS — Z8262 Family history of osteoporosis: Secondary | ICD-10-CM | POA: Diagnosis not present

## 2021-01-14 DIAGNOSIS — J9601 Acute respiratory failure with hypoxia: Secondary | ICD-10-CM | POA: Diagnosis not present

## 2021-01-14 DIAGNOSIS — I129 Hypertensive chronic kidney disease with stage 1 through stage 4 chronic kidney disease, or unspecified chronic kidney disease: Secondary | ICD-10-CM | POA: Diagnosis present

## 2021-01-14 DIAGNOSIS — M810 Age-related osteoporosis without current pathological fracture: Secondary | ICD-10-CM | POA: Diagnosis not present

## 2021-01-14 DIAGNOSIS — Z79890 Hormone replacement therapy (postmenopausal): Secondary | ICD-10-CM

## 2021-01-14 DIAGNOSIS — I21A1 Myocardial infarction type 2: Secondary | ICD-10-CM | POA: Diagnosis present

## 2021-01-14 DIAGNOSIS — K219 Gastro-esophageal reflux disease without esophagitis: Secondary | ICD-10-CM | POA: Diagnosis present

## 2021-01-14 DIAGNOSIS — Z9682 Presence of neurostimulator: Secondary | ICD-10-CM

## 2021-01-14 DIAGNOSIS — D801 Nonfamilial hypogammaglobulinemia: Secondary | ICD-10-CM | POA: Diagnosis present

## 2021-01-14 DIAGNOSIS — R519 Headache, unspecified: Secondary | ICD-10-CM | POA: Diagnosis present

## 2021-01-14 DIAGNOSIS — Z881 Allergy status to other antibiotic agents status: Secondary | ICD-10-CM

## 2021-01-14 DIAGNOSIS — Z825 Family history of asthma and other chronic lower respiratory diseases: Secondary | ICD-10-CM

## 2021-01-14 DIAGNOSIS — Z9884 Bariatric surgery status: Secondary | ICD-10-CM

## 2021-01-14 DIAGNOSIS — A0472 Enterocolitis due to Clostridium difficile, not specified as recurrent: Secondary | ICD-10-CM | POA: Diagnosis not present

## 2021-01-14 DIAGNOSIS — Z818 Family history of other mental and behavioral disorders: Secondary | ICD-10-CM

## 2021-01-14 DIAGNOSIS — Z85118 Personal history of other malignant neoplasm of bronchus and lung: Secondary | ICD-10-CM | POA: Diagnosis not present

## 2021-01-14 DIAGNOSIS — G9340 Encephalopathy, unspecified: Secondary | ICD-10-CM | POA: Diagnosis not present

## 2021-01-14 DIAGNOSIS — B952 Enterococcus as the cause of diseases classified elsewhere: Secondary | ICD-10-CM | POA: Diagnosis not present

## 2021-01-14 DIAGNOSIS — Z87440 Personal history of urinary (tract) infections: Secondary | ICD-10-CM

## 2021-01-14 DIAGNOSIS — R6510 Systemic inflammatory response syndrome (SIRS) of non-infectious origin without acute organ dysfunction: Secondary | ICD-10-CM | POA: Diagnosis present

## 2021-01-14 DIAGNOSIS — Z886 Allergy status to analgesic agent status: Secondary | ICD-10-CM

## 2021-01-14 DIAGNOSIS — R319 Hematuria, unspecified: Secondary | ICD-10-CM | POA: Diagnosis present

## 2021-01-14 DIAGNOSIS — Z820 Family history of epilepsy and other diseases of the nervous system: Secondary | ICD-10-CM

## 2021-01-14 DIAGNOSIS — R197 Diarrhea, unspecified: Secondary | ICD-10-CM

## 2021-01-14 DIAGNOSIS — Z8249 Family history of ischemic heart disease and other diseases of the circulatory system: Secondary | ICD-10-CM

## 2021-01-14 LAB — CBC WITH DIFFERENTIAL/PLATELET
Abs Immature Granulocytes: 0.04 10*3/uL (ref 0.00–0.07)
Basophils Absolute: 0 10*3/uL (ref 0.0–0.1)
Basophils Relative: 0 %
Eosinophils Absolute: 0 10*3/uL (ref 0.0–0.5)
Eosinophils Relative: 0 %
HCT: 40.4 % (ref 36.0–46.0)
Hemoglobin: 13.6 g/dL (ref 12.0–15.0)
Immature Granulocytes: 1 %
Lymphocytes Relative: 4 %
Lymphs Abs: 0.3 10*3/uL — ABNORMAL LOW (ref 0.7–4.0)
MCH: 29.8 pg (ref 26.0–34.0)
MCHC: 33.7 g/dL (ref 30.0–36.0)
MCV: 88.6 fL (ref 80.0–100.0)
Monocytes Absolute: 0.1 10*3/uL (ref 0.1–1.0)
Monocytes Relative: 2 %
Neutro Abs: 5.6 10*3/uL (ref 1.7–7.7)
Neutrophils Relative %: 93 %
Platelets: 120 10*3/uL — ABNORMAL LOW (ref 150–400)
RBC: 4.56 MIL/uL (ref 3.87–5.11)
RDW: 13.2 % (ref 11.5–15.5)
WBC: 6 10*3/uL (ref 4.0–10.5)
nRBC: 0 % (ref 0.0–0.2)

## 2021-01-14 LAB — RESPIRATORY PANEL BY PCR

## 2021-01-14 LAB — BLOOD GAS, ARTERIAL
Acid-Base Excess: 2.4 mmol/L — ABNORMAL HIGH (ref 0.0–2.0)
Bicarbonate: 27.1 mmol/L (ref 20.0–28.0)
O2 Saturation: 98.4 %
Patient temperature: 98.6
pCO2 arterial: 44.2 mmHg (ref 32.0–48.0)
pH, Arterial: 7.403 (ref 7.350–7.450)
pO2, Arterial: 153 mmHg — ABNORMAL HIGH (ref 83.0–108.0)

## 2021-01-14 LAB — COMPREHENSIVE METABOLIC PANEL
ALT: 29 U/L (ref 0–44)
AST: 33 U/L (ref 15–41)
Albumin: 3.4 g/dL — ABNORMAL LOW (ref 3.5–5.0)
Alkaline Phosphatase: 72 U/L (ref 38–126)
Anion gap: 11 (ref 5–15)
BUN: 23 mg/dL (ref 8–23)
CO2: 23 mmol/L (ref 22–32)
Calcium: 8.9 mg/dL (ref 8.9–10.3)
Chloride: 101 mmol/L (ref 98–111)
Creatinine, Ser: 0.81 mg/dL (ref 0.44–1.00)
GFR, Estimated: >60 mL/min (ref >60)
Glucose, Bld: 136 mg/dL — ABNORMAL HIGH (ref 70–99)
Potassium: 2.8 mmol/L — ABNORMAL LOW (ref 3.5–5.1)
Sodium: 135 mmol/L (ref 135–145)
Total Bilirubin: 0.4 mg/dL (ref 0.3–1.2)
Total Protein: 7.2 g/dL (ref 6.5–8.1)

## 2021-01-14 LAB — BASIC METABOLIC PANEL
Anion gap: 9 (ref 5–15)
BUN: 19 mg/dL (ref 8–23)
CO2: 25 mmol/L (ref 22–32)
Calcium: 8.6 mg/dL — ABNORMAL LOW (ref 8.9–10.3)
Chloride: 104 mmol/L (ref 98–111)
Creatinine, Ser: 0.83 mg/dL (ref 0.44–1.00)
GFR, Estimated: >60 mL/min (ref >60)
Glucose, Bld: 142 mg/dL — ABNORMAL HIGH (ref 70–99)
Potassium: 2.9 mmol/L — ABNORMAL LOW (ref 3.5–5.1)
Sodium: 138 mmol/L (ref 135–145)

## 2021-01-14 LAB — URINALYSIS, ROUTINE W REFLEX MICROSCOPIC
Bilirubin Urine: NEGATIVE
Glucose, UA: NEGATIVE mg/dL
Ketones, ur: NEGATIVE mg/dL
Leukocytes,Ua: NEGATIVE
Nitrite: NEGATIVE
Protein, ur: NEGATIVE mg/dL
Specific Gravity, Urine: 1.01 (ref 1.005–1.030)
pH: 6 (ref 5.0–8.0)

## 2021-01-14 LAB — RESP PANEL BY RT-PCR (FLU A&B, COVID) ARPGX2
Influenza A by PCR: NEGATIVE
Influenza B by PCR: NEGATIVE
SARS Coronavirus 2 by RT PCR: NEGATIVE

## 2021-01-14 LAB — APTT: aPTT: 80 seconds — ABNORMAL HIGH (ref 24–36)

## 2021-01-14 LAB — GLUCOSE, CAPILLARY: Glucose-Capillary: 142 mg/dL — ABNORMAL HIGH (ref 70–99)

## 2021-01-14 LAB — URINALYSIS, MICROSCOPIC (REFLEX): WBC, UA: NONE SEEN WBC/hpf (ref 0–5)

## 2021-01-14 LAB — PROTIME-INR
INR: 0.9 (ref 0.8–1.2)
Prothrombin Time: 12.4 seconds (ref 11.4–15.2)

## 2021-01-14 LAB — MAGNESIUM: Magnesium: 1.4 mg/dL — ABNORMAL LOW (ref 1.7–2.4)

## 2021-01-14 LAB — PROCALCITONIN
Procalcitonin: 3.55 ng/mL
Procalcitonin: 4.97 ng/mL

## 2021-01-14 LAB — TROPONIN I (HIGH SENSITIVITY): Troponin I (High Sensitivity): 1493 ng/L (ref <18)

## 2021-01-14 LAB — LACTIC ACID, PLASMA
Lactic Acid, Venous: 1.3 mmol/L (ref 0.5–1.9)
Lactic Acid, Venous: 1 mmol/L (ref 0.5–1.9)

## 2021-01-14 LAB — D-DIMER, QUANTITATIVE: D-Dimer, Quant: 0.78 ug/mL-FEU — ABNORMAL HIGH (ref 0.00–0.50)

## 2021-01-14 MED ORDER — PIPERACILLIN-TAZOBACTAM 3.375 G IVPB
3.3750 g | Freq: Three times a day (TID) | INTRAVENOUS | Status: DC
  Administered 2021-01-15 – 2021-01-17 (×7): 3.375 g via INTRAVENOUS
  Filled 2021-01-14 (×7): qty 50

## 2021-01-14 MED ORDER — SODIUM CHLORIDE 0.9 % IV SOLN: 2.0000 g | Freq: Once | INTRAVENOUS | Status: DC

## 2021-01-14 MED ORDER — SODIUM CHLORIDE 0.9% FLUSH
10.0000 mL | INTRAVENOUS | Status: DC | PRN
  Administered 2021-01-22: 10 mL

## 2021-01-14 MED ORDER — CHLORHEXIDINE GLUCONATE CLOTH 2 % EX PADS
6.0000 | MEDICATED_PAD | Freq: Every day | CUTANEOUS | Status: DC
  Administered 2021-01-16 – 2021-01-22 (×8): 6 via TOPICAL

## 2021-01-14 MED ORDER — VANCOMYCIN HCL 750 MG/150ML IV SOLN
750.0000 mg | INTRAVENOUS | Status: DC
  Filled 2021-01-14: qty 150

## 2021-01-14 MED ORDER — LEVOTHYROXINE SODIUM 50 MCG PO TABS
75.0000 ug | ORAL_TABLET | Freq: Every day | ORAL | Status: DC
  Administered 2021-01-15 – 2021-01-22 (×8): 75 ug via ORAL
  Filled 2021-01-14 (×8): qty 1

## 2021-01-14 MED ORDER — SODIUM CHLORIDE 0.9 % IV SOLN
75.0000 mL/h | INTRAVENOUS | Status: DC
  Administered 2021-01-15: 75 mL/h via INTRAVENOUS

## 2021-01-14 MED ORDER — LACTATED RINGERS IV BOLUS (SEPSIS)
1000.0000 mL | Freq: Once | INTRAVENOUS | Status: AC
  Administered 2021-01-14: 1000 mL via INTRAVENOUS

## 2021-01-14 MED ORDER — KETOROLAC TROMETHAMINE 30 MG/ML IJ SOLN
15.0000 mg | Freq: Once | INTRAMUSCULAR | Status: AC
  Administered 2021-01-14: 15 mg via INTRAVENOUS
  Filled 2021-01-14 (×2): qty 1

## 2021-01-14 MED ORDER — ONDANSETRON HCL 4 MG/2ML IJ SOLN
4.0000 mg | Freq: Once | INTRAMUSCULAR | Status: AC
  Administered 2021-01-14: 4 mg via INTRAVENOUS
  Filled 2021-01-14: qty 2

## 2021-01-14 MED ORDER — SODIUM CHLORIDE 0.9% FLUSH
10.0000 mL | Freq: Two times a day (BID) | INTRAVENOUS | Status: DC
  Administered 2021-01-14: 20 mL
  Administered 2021-01-15 – 2021-01-16 (×3): 10 mL
  Administered 2021-01-16 – 2021-01-17 (×2): 20 mL
  Administered 2021-01-17 – 2021-01-22 (×5): 10 mL

## 2021-01-14 MED ORDER — PIPERACILLIN-TAZOBACTAM 3.375 G IVPB 30 MIN
3.3750 g | Freq: Once | INTRAVENOUS | Status: AC
  Administered 2021-01-14: 3.375 g via INTRAVENOUS
  Filled 2021-01-14: qty 50

## 2021-01-14 MED ORDER — SODIUM CHLORIDE 0.9 % IV SOLN
100.0000 mg | Freq: Two times a day (BID) | INTRAVENOUS | Status: DC
  Administered 2021-01-15 (×2): 100 mg via INTRAVENOUS
  Filled 2021-01-14 (×2): qty 100

## 2021-01-14 MED ORDER — VANCOMYCIN HCL IN DEXTROSE 1-5 GM/200ML-% IV SOLN
1000.0000 mg | Freq: Once | INTRAVENOUS | Status: AC
  Administered 2021-01-14: 1000 mg via INTRAVENOUS
  Filled 2021-01-14: qty 200

## 2021-01-14 MED ORDER — METRONIDAZOLE 500 MG/100ML IV SOLN: 500.0000 mg | Freq: Once | INTRAVENOUS | Status: DC

## 2021-01-14 MED ORDER — ACETAMINOPHEN 325 MG PO TABS
650.0000 mg | ORAL_TABLET | Freq: Once | ORAL | Status: AC
  Administered 2021-01-14: 650 mg via ORAL
  Filled 2021-01-14: qty 2

## 2021-01-14 MED ORDER — POTASSIUM CHLORIDE CRYS ER 20 MEQ PO TBCR
40.0000 meq | EXTENDED_RELEASE_TABLET | Freq: Once | ORAL | Status: AC
  Administered 2021-01-14: 40 meq via ORAL
  Filled 2021-01-14: qty 2

## 2021-01-14 MED ORDER — POTASSIUM CHLORIDE 10 MEQ/100ML IV SOLN
10.0000 meq | INTRAVENOUS | Status: AC
  Administered 2021-01-14 – 2021-01-15 (×4): 10 meq via INTRAVENOUS
  Filled 2021-01-14 (×4): qty 100

## 2021-01-14 NOTE — Progress Notes (Signed)
Pharmacy Antibiotic Note  Kendrick Remigio is a 75 y.o. female admitted on 01/14/2021 with sepsis.  Pharmacy has been consulted for vancomycin and zosyn dosing. Patient with PMH significant for lung cancer, immunoglobulin deficiency (PTA Gammagard infusions) presents with reports of generalized weakness, chills, and tachycardia. Patient with extensive medication allergies documented including to tetracyclines and cefixime. Most recent WBC 6.0, Scr 0.81, and LA 1.0. Per chart review, patient has tolerated amoxicillin in the past.    Plan: Vancomycin 1000 MG x1 load followed by 750 MG Q24H (predicted AUC 422, Scr 0.81, weight 52 KG)  Zosyn 3.375 G Q8H  Monitor clinical status, treatment plan, and cultures Vancomycin levels as indicated   Height: 5\' 3"  (160 cm) Weight: 52.2 kg (115 lb) IBW/kg (Calculated) : 52.4  Temp (24hrs), Avg:101 F (38.3 C), Min:100.6 F (38.1 C), Max:101.8 F (38.8 C)  Recent Labs  Lab 01/11/21 1214 01/14/21 1547 01/14/21 1755  WBC 4.9 6.0  --   CREATININE 0.90 0.81  --   LATICACIDVEN  --  1.3 1.0    Estimated Creatinine Clearance: 51.7 mL/min (by C-G formula based on SCr of 0.81 mg/dL).    Allergies  Allergen Reactions  . Tetracycline Nausea Only    Causes ringing in ears, dizziness, migraine, nausea  . Corticosteroids Other (See Comments)    12/02/2016 interview by Melissa Montane: Oral dosing causes migraines/nausea/tinnitus. Prev tolerated IV and intranasal admin w/o difficulty.  . Cefixime Other (See Comments)    Headache   . Ciprofloxacin Other (See Comments)    Headache, dizziness, ringing in the ears  . Doxycycline Other (See Comments)    unknown  . Gabapentin     Throat swells/closes  . Isometheptene-Dichloral-Apap Other (See Comments)    unknown  . Ketorolac     12/02/2016 interview by XHB: Oral dosing prednisone/corticosteroids causes migraines/nausea/tinnitus. Prev tolerated IV and intranasal admin w/o difficulty.  . Prednisone Other (See  Comments)    Migraine, dizzy, ringing in ears-can take it IV 12/02/2016 interview by [name]: Oral prednisone dosing causes migraines/nausea/tinnitus. Prev tolerated IV and intranasal admin w/o difficulty.  . Promethazine Other (See Comments)    causes severe abdominal pain when taken IV; however she can take PO or IM  . Stadol [Butorphanol]     Cerebral pain  . Erythromycin Base Diarrhea and Itching    Severe diarrhea  . Propoxyphene Nausea Only    Antimicrobials this admission: 5/31 Vancomycin >> 5/31 Zosyn >>  Dose adjustments this admission:   Microbiology results: 5/31 BCx: sent 5/31 UCx: sent   Thank you for allowing pharmacy to be a part of this patient's care.  Cephus Slater, PharmD, [facility] Resident [phone] 01/14/2021 7:37 PM

## 2021-01-14 NOTE — ED Notes (Signed)
Pt states she was having chest pain and shortness of breath this morning

## 2021-01-14 NOTE — ED Notes (Signed)
Husband updated on status per patients request.

## 2021-01-14 NOTE — H&P (Addendum)
Megan Brewer EYC:[id number] DOB: 1946/02/25 DOA: 01/14/2021   PCP: [name], NP   Outpatient Specialists:     Urology Dr. Phillips Climes  Patient arrived to ER on 01/14/21 at 1429 Referred by Attending Toy Baker, MD   Patient coming from: home Lives With family    Chief Complaint:   Chief Complaint  Patient presents with  . Chills    HPI: Megan Brewer is a 75 y.o. female with medical history significant of lung cancer sp ressection, immunoglobulin deficiency (PTA Gammagard infusions), recurrent UTi Sp Gastric bipass 8-10 y ago    Presented with   generalized fatigue chills tachycardia febrile.  Negative for COVID. Husband tested positive for COVID a month ago but is currently recovered. No chest pain or shortness of breath. Noted significant generalized pain   Reports she was having generalized pain but also chronic chest pain   this morning Reports headache front and top  Not congested Reports back pain from top to bottom Reports a little bit of cough no sneezing  She was on chronic pain medications but she quit few weeks ago No tick bites No dysuria, reports some nausea Reports recent she was diagnosed with gastroenteritis for few weeks She still has occasional diarrhea  Pt had a neg stress test in 2020  Infectious risk factors:  Reports  fever,  dry cough, chest pain, diarrhea   Has  been vaccinated against COVID and boosted   Initial COVID TEST  NEGATIVE   Lab Results  Component Value Date   [place] NEGATIVE 01/14/2021   [place] NEGATIVE 02/27/2019     Regarding pertinent Chronic problems:      Hypothyroidism:  Lab Results  Component Value Date   TSH 5.05 (H) 06/10/2020   on synthroid   History of lung cancer status postresection  History of chronic pain states she stopped taking her pain medications for the past 6 weeks  While in ER: Noted to be initially tachycardic and febrile Potassium down  to 2.8 no source was noted Chest x-ray unremarkable urine showed small amount of blood CT renal negative for kidney stones   ED Triage Vitals  Enc Vitals Group     BP 01/14/21 1435 (!) 153/113     Pulse Rate 01/14/21 1435 (!) 121     Resp 01/14/21 1435 (!) 30     Temp 01/14/21 1435 (!) 101.8 F (38.8 C)     Temp Source 01/14/21 1435 Oral     SpO2 01/14/21 1435 98 %     Weight 01/14/21 1437 115 lb (52.2 kg)     Height 01/14/21 1437 $RemoveBefor'5\' 3"'WQaRophLnqLJ$  (1.6 m)     Head Circumference --      Peak Flow --      Pain Score 01/14/21 1437 10     Pain Loc --      Pain Edu? --      Excl. in [place]? --   TMAX(24)@     _________________________________________ Significant initial  Findings: Abnormal Labs Reviewed  COMPREHENSIVE METABOLIC PANEL - Abnormal; Notable for the following components:      Result Value   Potassium 2.8 (*)    Glucose, Bld 136 (*)    Albumin 3.4 (*)    All other components within normal limits  CBC WITH DIFFERENTIAL/PLATELET - Abnormal; Notable for the following components:   Platelets 120 (*)    Lymphs Abs 0.3 (*)    All other components within normal limits  APTT -  Abnormal; Notable for the following components:   aPTT 80 (*)    All other components within normal limits  URINALYSIS, ROUTINE W REFLEX MICROSCOPIC - Abnormal; Notable for the following components:   Hgb urine dipstick SMALL (*)    All other components within normal limits  URINALYSIS, MICROSCOPIC (REFLEX) - Abnormal; Notable for the following components:   Bacteria, UA RARE (*)    All other components within normal limits  MAGNESIUM - Abnormal; Notable for the following components:   Magnesium 1.4 (*)    All other components within normal limits   ____________________________________________ Ordered   CXR -  NON acute  CTabd/pelvis - Status post gastric bypass without adverse features at this time.   ECG: Ordered Personally reviewed by me showing: HR : 107 Rhythm:  Sinus tachycardia  , LBBB,     no  evidence of ischemic changes QTC 453 ____________________ This patient meets SIRS Criteria     The recent clinical data is shown below. Vitals:   01/14/21 1800 01/14/21 1848 01/14/21 1900 01/14/21 2017  BP: (!) 127/94 137/83 (!) 151/70 (!) 148/72  Pulse: (!) 108 (!) 106 (!) 101 100  Resp: (!) 25 (!) 25 (!) 33 (!) 21  Temp:    98.9 F (37.2 C)  TempSrc:    Oral  SpO2: 98% 99% 98% 98%  Weight:      Height:        WBC     Component Value Date/Time   WBC 6.0 01/14/2021 1547   LYMPHSABS 0.3 (L) 01/14/2021 1547   MONOABS 0.1 01/14/2021 1547   EOSABS 0.0 01/14/2021 1547   BASOSABS 0.0 01/14/2021 1547   Lactic Acid, Venous    Component Value Date/Time   LATICACIDVEN 1.0 01/14/2021 1755    Procalcitonin 4.97     UA   no evidence of UTI    Urine analysis:    Component Value Date/Time   COLORURINE YELLOW 01/14/2021 1547   APPEARANCEUR CLEAR 01/14/2021 1547   LABSPEC 1.010 01/14/2021 1547   PHURINE 6.0 01/14/2021 1547   GLUCOSEU NEGATIVE 01/14/2021 1547   HGBUR SMALL (A) 01/14/2021 1547   BILIRUBINUR NEGATIVE 01/14/2021 1547   BILIRUBINUR N/A 03/25/2020 1533   KETONESUR NEGATIVE 01/14/2021 1547   PROTEINUR NEGATIVE 01/14/2021 1547   UROBILINOGEN negative (A) 03/25/2020 1533   NITRITE NEGATIVE 01/14/2021 1547   LEUKOCYTESUR NEGATIVE 01/14/2021 1547    Results for orders placed or performed during the hospital encounter of 01/14/21  Resp Panel by RT-PCR (Flu A&B, Covid) Nasopharyngeal Swab     Status: None   Collection Time: 01/14/21  3:47 PM   Specimen: Nasopharyngeal Swab; Nasopharyngeal(NP) swabs in vial transport medium  Result Value Ref Range Status   SARS Coronavirus 2 by RT PCR NEGATIVE NEGATIVE Final         Influenza A by PCR NEGATIVE NEGATIVE Final   Influenza B by PCR NEGATIVE NEGATIVE Final         _______________________________________________ Hospitalist was called for admission for SIRS  The following Work up has been ordered so far:  Orders Placed  This Encounter  Procedures  . Urine culture  . Blood Culture (routine x 2)  . Resp Panel by RT-PCR (Flu A&B, Covid) Nasopharyngeal Swab  . Respiratory (~20 pathogens) panel by PCR  . DG Chest Port 1 View  . CT Renal Stone Study  . Lactic acid, plasma  . Comprehensive metabolic panel  . CBC WITH DIFFERENTIAL  . Protime-INR  . APTT  . Urinalysis,  Routine w reflex microscopic  . Urinalysis, Microscopic (reflex)  . Magnesium  . Procalcitonin - Baseline  . Procalcitonin  . Diet NPO time specified  . Cardiac monitoring  . Document height and weight  . Assess and Document Glasgow Coma Scale  . Document vital signs within 1-hour of fluid bolus completion.  Notify provider of abnormal vital signs despite fluid resuscitation.  . Refer to Sidebar Report: Sepsis Bundle ED/IP  . Notify provider for difficulties obtaining IV access  . Initiate Carrier Fluid Protocol  . Cardiac monitoring  . pharmacy consult  . Consult to hospitalist  . pharmacy consult  . aztreonam (AZACTAM) per pharmacy consult  . vancomycin per pharmacy consult  . piperacillin-tazobactam (ZOSYN) per pharmacy consult  . Droplet precaution  . Pulse oximetry, continuous  . EKG 12-Lead  . Insert peripheral IV X 1  . Place in observation (patient's expected length of stay will be less than 2 midnights)    Following Medications were ordered in ER: Medications  vancomycin (VANCOREADY) IVPB 750 mg/150 mL (has no administration in time range)  piperacillin-tazobactam (ZOSYN) IVPB 3.375 g (has no administration in time range)  lactated ringers bolus 1,000 mL (0 mLs Intravenous Stopped 01/14/21 1851)  acetaminophen (TYLENOL) tablet 650 mg (650 mg Oral Given 01/14/21 1610)  ondansetron (ZOFRAN) injection 4 mg (4 mg Intravenous Given 01/14/21 1604)  ketorolac (TORADOL) 30 MG/ML injection 15 mg (15 mg Intravenous Given 01/14/21 1819)  potassium chloride SA (KLOR-CON) CR tablet 40 mEq (40 mEq Oral Given 01/14/21 1847)  vancomycin  (VANCOCIN) IVPB 1000 mg/200 mL premix (1,000 mg Intravenous New Bag/Given 01/14/21 1953)  piperacillin-tazobactam (ZOSYN) IVPB 3.375 g (0 g Intravenous Stopped 01/14/21 2005)        Consult Orders  (From admission, onward)         Start     Ordered   01/14/21 1803  Consult to hospitalist  Called [facility] spoke with Marden Noble  Once       Provider:  (Not yet assigned)  Question Answer Comment  Place call to: [facility]   Reason for Consult Admit      01/14/21 1802            OTHER Significant initial  Findings:  labs showing:    Recent Labs  Lab 01/11/21 1214 01/14/21 1547  NA 135 135  K 3.9 2.8*  CO2 25 23  GLUCOSE 102* 136*  BUN 23 23  CREATININE 0.90 0.81  CALCIUM 8.8* 8.9  MG  --  1.4*    C  stable,    Lab Results  Component Value Date   CREATININE 0.81 01/14/2021   CREATININE 0.90 01/11/2021   CREATININE 0.97 (H) 01/02/2021    Recent Labs  Lab 01/11/21 1214 01/14/21 1547  AST 39 33  ALT 36 29  ALKPHOS 64 72  BILITOT 0.5 0.4  PROT 8.0 7.2  ALBUMIN 3.6 3.4*   Lab Results  Component Value Date   CALCIUM 8.9 01/14/2021   PHOS 3.0 01/25/2010      Plt: Lab Results  Component Value Date   PLT 120 (L) 01/14/2021      COVID-19 Labs  Recent Labs    01/14/21 2204  DDIMER 0.78*    Lab Results  Component Value Date   SARSCOV2NAA NEGATIVE 01/14/2021   SARSCOV2NAA NEGATIVE 02/27/2019     ABG    Component Value Date/Time   PHART 7.403 01/14/2021 2207   PCO2ART 44.2 01/14/2021 2207   PO2ART 153 (H) 01/14/2021 2207  HCO3 27.1 01/14/2021 2207   O2SAT 98.4 01/14/2021 2207       Recent Labs  Lab 01/11/21 1214 01/14/21 1547  WBC 4.9 6.0  NEUTROABS 3.2 5.6  HGB 13.7 13.6  HCT 42.3 40.4  MCV 90.8 88.6  PLT 165 120*    HG/HCT   Stable,     Component Value Date/Time   HGB 13.6 01/14/2021 1547   HCT 40.4 01/14/2021 1547   MCV 88.6 01/14/2021 1547       Cardiac Panel (last 3 results) No results for input(s): CKTOTAL, CKMB,  TROPONINI, RELINDX in the last 72 hours.   BNP (last 3 results) Recent Labs    01/11/21 1214  BNP 180.1*         Cultures:    Component Value Date/Time   SDES  12/09/2020 1200    URINE, RANDOM Performed at [facility], [address] [place],  [place] [address]    [facility]  12/09/2020 1200    NONE Performed at [facility], [address] [place], [place] [address]    CULT >=100,000 COLONIES/mL ESCHERICHIA COLI (A) 12/09/2020 1200   REPTSTATUS 12/12/2020 FINAL 12/09/2020 1200     Radiological Exams on Admission: DG Chest Port 1 View  Result Date: 01/14/2021 CLINICAL DATA:  Sepsis. EXAM: PORTABLE CHEST 1 VIEW COMPARISON:  Jan 11, 2021. FINDINGS: The heart size and mediastinal contours are within normal limits. Left subclavian Port-A-Cath is unchanged in position. No pneumothorax or pleural effusion is noted. Left lung is clear. Right infrahilar postoperative changes are again noted with associated scarring. The visualized skeletal structures are unremarkable. IMPRESSION: No definite acute abnormality Electronically Signed   By: Marijo Conception M.D.   On: 01/14/2021 15:39   CT Renal Stone Study  Result Date: 01/14/2021 CLINICAL DATA:  Abdomen pain hematuria EXAM: CT ABDOMEN AND PELVIS WITHOUT CONTRAST TECHNIQUE: Multidetector CT imaging of the abdomen and pelvis was performed following the standard protocol without IV contrast. COMPARISON:  CT 12/09/2020 FINDINGS: Lower chest: Lung bases demonstrate chronic right pleural thickening with calcifications. Slight rightward deviation of distal esophagus which contains radiopaque material. Hepatobiliary: Status post cholecystectomy. Stable intra and extrahepatic biliary dilatation. No focal hepatic abnormality. Pancreas: Unremarkable. No pancreatic ductal dilatation or surrounding inflammatory changes. Spleen: Normal in size without focal abnormality. Adrenals/Urinary Tract: Thickened adrenal glands without dominant  mass. Kidneys show no hydronephrosis or ureteral stone. Distended urinary bladder Stomach/Bowel: Status post gastric bypass. No obstructive changes. No acute bowel wall thickening. Vascular/Lymphatic: Moderate aortic atherosclerosis. No aneurysm. No suspicious nodes Reproductive: Status post hysterectomy. No adnexal masses. Other: Negative for pelvic effusion or free air. Musculoskeletal: No acute osseous abnormality. Left-sided sacral stimulator. Right posterior ascending thoracic stimulator with lead entering the canal at approximate T10 level. IMPRESSION: 1. No CT evidence for acute intra-abdominal or pelvic abnormality. 2. Chronic right pleural thickening with calcifications 3. Status post gastric bypass without adverse features at this time. Electronically Signed   By: Donavan Foil M.D.   On: 01/14/2021 20:05   _______________________________________________________________________________________________________ Latest  Blood pressure (!) 148/72, pulse 100, temperature 98.9 F (37.2 C), temperature source Oral, resp. rate (!) 21, height $RemoveBe'5\' 3"'gdvXtgKhn$  (1.6 m), weight 52.2 kg, SpO2 98 %.   Review of Systems:    Pertinent positives include:  Fevers, chills, fatigue diarrhea,  Constitutional:  No weight loss, night sweats,, weight loss  HEENT:  No headaches, Difficulty swallowing,Tooth/dental problems,Sore throat,  No sneezing, itching, ear ache, nasal congestion, post nasal drip,  Cardio-vascular:  No chest pain, Orthopnea, PND, anasarca, dizziness, palpitations.no Bilateral lower extremity swelling  GI:  No heartburn, indigestion, abdominal pain, nausea, vomiting,  change in bowel habits, loss of appetite, melena, blood in stool, hematemesis Resp:  no shortness of breath at rest. No dyspnea on exertion, No excess mucus, no productive cough, No non-productive cough, No coughing up of blood.No change in color of mucus.No wheezing. Skin:  no rash or lesions. No jaundice GU:  no dysuria, change in  color of urine, no urgency or frequency. No straining to urinate.  No flank pain.  Musculoskeletal:  No joint pain or no joint swelling. No decreased range of motion. No back pain.  Psych:  No change in mood or affect. No depression or anxiety. No memory loss.  Neuro: no localizing neurological complaints, no tingling, no weakness, no double vision, no gait abnormality, no slurred speech, no confusion  All systems reviewed and apart from [place] all are negative _______________________________________________________________________________________________ Past Medical History:   Past Medical History:  Diagnosis Date  . Allergy   . Anemia   . Arachnoiditis   . Bipolar disorder ([place])   . Blood transfusion without reported diagnosis   . Cataract    removed  . Chronic back pain   . Chronic pain   . Chronic post-thoracotomy pain   . CKD (chronic kidney disease)   . GERD (gastroesophageal reflux disease)   . Hypogammaglobulinemia ([place])   . Hypotension   . Hypothyroid   . 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 Lung cancer ([place]) 2011, 2014  . Memory loss   . Migraine   . Opioid abuse ([place])   . Osteoporosis   . Presence of neurostimulator   . Recurrent upper respiratory infection (URI)   . Restless legs   . Sleep apnea   . Tremor   . Urge incontinence   . Urticaria    long time ago, nothing recent     Past Surgical History:  Procedure Laterality Date  . ABDOMINAL HYSTERECTOMY    . CARPAL TUNNEL RELEASE    . CATARACT EXTRACTION Bilateral 2019  . CHOLECYSTECTOMY    . COLONOSCOPY  2015   [facility]  . CT LUNG SCREENING  2018  . DG  BONE DENSITY ([place] HX)  2018  . DIAGNOSTIC MAMMOGRAM  2019  . GASTRIC BYPASS    . LUNG CANCER SURGERY  02/2010  . MULTIPLE TOOTH EXTRACTIONS    . SPINAL CORD STIMULATOR IMPLANT      Social History:  Ambulatory  independently      reports that she has never smoked. She has never used smokeless tobacco. She reports previous alcohol use. She  reports that she does not use drugs.   Family History:   Family History  Problem Relation Age of Onset  . Hypertension Mother   . GER disease Mother   . Dementia Mother   . Osteoporosis Mother   . Arthritis Mother   . Bipolar disorder Mother   . Parkinson's disease Father   . Congestive Heart Failure Father   . Pneumonia Father   . Arthritis Father   . Dementia Father   . Depression Father   . Asthma Sister   . Depression Sister   . Bipolar disorder Brother   . Asthma Daughter   . Colon cancer Neg Hx   . Esophageal cancer Neg Hx   . Stomach cancer Neg Hx   . Rectal cancer Neg Hx    ______________________________________________________________________________________________ Allergies: Allergies  Allergen  Reactions  . Tetracycline Nausea Only    Causes ringing in ears, dizziness, migraine, nausea  . Corticosteroids Other (See Comments)    12/02/2016 interview by Melissa Montane: Oral dosing causes migraines/nausea/tinnitus. Prev tolerated IV and intranasal admin w/o difficulty.  . Cefixime Other (See Comments)    Headache   . Ciprofloxacin Other (See Comments)    Headache, dizziness, ringing in the ears  . Doxycycline Other (See Comments)    unknown  . Gabapentin     Throat swells/closes  . Isometheptene-Dichloral-Apap Other (See Comments)    unknown  . Ketorolac     12/02/2016 interview by OHY: Oral dosing prednisone/corticosteroids causes migraines/nausea/tinnitus. Prev tolerated IV and intranasal admin w/o difficulty.  . Prednisone Other (See Comments)    Migraine, dizzy, ringing in ears-can take it IV 12/02/2016 interview by [name]: Oral prednisone dosing causes migraines/nausea/tinnitus. Prev tolerated IV and intranasal admin w/o difficulty.  . Promethazine Other (See Comments)    causes severe abdominal pain when taken IV; however she can take PO or IM  . Stadol [Butorphanol]     Cerebral pain  . Erythromycin Base Diarrhea and Itching    Severe diarrhea  . Propoxyphene  Nausea Only     Prior to Admission medications   Medication Sig Start Date End Date Taking? Authorizing Provider  acetaminophen (TYLENOL) 500 MG tablet Take 1-2 tablets (500-1,000 mg total) by mouth every 8 (eight) hours as needed (Max dose 3000 mg in 24 hours). 06/11/20   [name], NP  Azelastine-Fluticasone 137-50 MCG/ACT SUSP ONE SPRAY EACH NOSTRIL TWICE A DAY AS NEEDED 10/01/20   Ambs, Kathrine Cords, FNP  baclofen (LIORESAL) 10 MG tablet Take 10 mg by mouth every 28 (twenty-eight) days. 06/03/20   [provider]  Buprenorphine HCl-Naloxone HCl 4-1 MG FILM Place 1 strip under the tongue 2 (two) times daily. Patient not taking: No sig reported 10/31/19   [provider]  Calcium Citrate 200 MG TABS Take 2 tablets by mouth daily. 12/02/16   [provider]  cycloSPORINE (RESTASIS) 0.05 % ophthalmic emulsion Place 1 drop into both eyes 2 (two) times daily.    [provider]  dexlansoprazole (DEXILANT) 60 MG capsule Take 1 capsule (60 mg total) by mouth daily. Crack open the capsule and pour into spoon prior to ingestion. PLEASE SCHEDULE AN OFFICE VISIT FOR FURTHER REFILLS. Thank you 01/03/21   Armbruster, Carlota Raspberry, MD  Erenumab-aooe (AIMOVIG, 140 MG DOSE,) 70 MG/ML SOAJ Inject into the skin every 30 (thirty) days.    [provider]  estradiol (ESTRACE) 0.1 MG/GM vaginal cream 3 (three) times a week. 01/12/20   [provider]  ferrous sulfate 325 (65 FE) MG tablet Take 325 mg by mouth 2 (two) times daily with a meal.    [provider]  hydrOXYzine (VISTARIL) 25 MG capsule TAKE ONE CAPSULE BY MOUTH TWICE DAILY AS NEEDED 01/07/21   Thayer Headings, PMHNP  ibuprofen (ADVIL) 200 MG tablet Take 200 mg by mouth every 6 (six) hours as needed.    [provider]  immune globulin, human, (GAMMAGARD S/D LESS IGA) 10 g injection Inject into the vein every 21 ( twenty-one) days.     [provider]  lamoTRIgine (LAMICTAL) 200  MG tablet TAKE 1 TABLET BY MOUTH DAILY 01/01/21   Thayer Headings, PMHNP  LASTACAFT 0.25 % SOLN Apply 1 drop to eye daily. 01/19/19   [provider]  levothyroxine (SYNTHROID) 75 MCG tablet Take one tablet by mouth once daily  30 minutes before breakfast on empty stomach. 08/21/20   [name], NP  magnesium oxide (MAG-OX) 400 MG tablet Take 400 mg by mouth daily.     [provider]  mirtazapine (REMERON) 30 MG tablet Take 1/2 tablet at bedtime for one week, then increase to 1 tablet at bedtime 01/09/21   Thayer Headings, PMHNP  Multiple Vitamins-Minerals (MEGAVITE FRUITS & VEGGIES PO) Take by mouth.    [provider]  potassium chloride SA (KLOR-CON) 20 MEQ tablet TAKE TWO TABLETS BY MOUTH DAILY (MAY DISSOLVE IN WATER) 06/11/20   [name], NP  rOPINIRole (REQUIP) 1 MG tablet TAKE 1 TABLET BY MOUTH AT BEDTIME 09/09/20   Patel, Donika K, DO  saccharomyces boulardii (FLORASTOR) 250 MG capsule Take 1 capsule (250 mg total) by mouth 2 (two) times daily. 01/02/21   Fargo, Amy E, NP  sucralfate (CARAFATE) 1 GM/10ML suspension Take 2 tsp by mouth three times daily as needed. 08/07/20   Ngetich, Dinah C, NP  tiZANidine (ZANAFLEX) 4 MG tablet Take 1 tablet (4 mg total) by mouth every 6 (six) hours as needed for muscle spasms. 11/24/20   Drenda Freeze, MD  torsemide (DEMADEX) 20 MG tablet TAKE 1 TABLET BY MOUTH DAILY 07/23/20   [name], NP  triamcinolone cream (KENALOG) 0.1 % Apply 1 application topically 2 (two) times daily as needed.    [provider]  UNABLE TO FIND Med Name: [name] 1 by mouth daily for UTI prevention    [provider]  ZINC OXIDE, TOPICAL, 10 % CREA Apply 1 application topically in the morning and at bedtime. 12/06/20   Ngetich, Nelda Bucks, NP    ___________________________________________________________________________________________________ Physical Exam: Vitals with BMI 01/14/2021 01/14/2021 01/14/2021  Height - - -   Weight - - -  BMI - - -  Systolic 287 867 672  Diastolic 72 70 83  Pulse 094 101 106    1. General:  in No  Acute distress   Chronically ill  -appearing 2. Psychological: somnolent but  Oriented 3. Head/ENT:    Dry Mucous Membranes                          Head Non traumatic, neck supple                            Poor Dentition 4. SKIN: decreased Skin turgor,  Skin clean Dry and intact no rash 5. Heart: Regular rate and rhythm no Murmur, no Rub or gallop 6. Lungs:   no wheezes or crackles   7. Abdomen: Soft,  non-tender, Non distended bowel sounds present 8. Lower extremities: no clubbing, cyanosis, no  edema 9. Neurologically Grossly intact, moving all 4 extremities equally  10. MSK: Normal range of motion    Chart has been reviewed  ______________________________________________________________________________________________  Assessment/Plan  75 y.o. 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 SIRS (systemic inflammatory response syndrome) (HCC) -   -SIRS criteria met with       Component Value Date/Time   WBC 6.0 01/14/2021 1547   LYMPHSABS 0.3 (L) 01/14/2021 1547     tachycardia   , fever   RR >20 Today's Vitals   01/14/21 2017 01/14/21 2121 01/14/21 2300 01/14/21 2307  BP: Marland Kitchen)  148/72 (!) 144/81  (!) 163/63  Pulse: 100 91 81 82  Resp: (!) $RemoveB'21  15 16  'APSQhiuw$ Temp: 98.9 F (37.2 C) 98.6 F (37 C)    TempSrc: Oral Oral    SpO2: 98% 99% 100% 100%  Weight:      Height:      PainSc: 7       Body mass index is 20.37 kg/m.  This patient meets SIRS Criteria and may be septic.      The recent clinical data is shown below. Vitals:   01/14/21 2017 01/14/21 2121 01/14/21 2300 01/14/21 2307  BP: (!) 148/72 (!) 144/81  (!) 163/63  Pulse: 100 91 81 82  Resp: (!) $RemoveB'21  15 16  'lENvqfyo$ Temp: 98.9 F (37.2 C)  98.6 F (37 C)    TempSrc: Oral Oral    SpO2: 98% 99% 100% 100%  Weight:      Height:        Source of sepsis is unknown but given clinical picture will continue to treat   Patient meeting criteria for Severe sepsis with    evidence of end organ damage/organ dysfunction such as    acute metabolic encephalopathy Elevated troponin      - Obtain serial lactic acid and procalcitonin level.  - Initiated IV antibiotics   - await results of blood and urine culture  - Rehydrate aggressively       1:44 AM   . Acute metabolic encephalopathy -   - most likely multifactorial secondary to combination of infection  mild dehydration secondary to decreased by mouth intake,  polypharmacy   - Will rehydrate   - treat underlining infection   - Hold contributing medications   - if no improvement may need further imaging to evaluate for CNS pathology pathology such as MRI of the brain   - neurological exam appears to be nonfocal but patient unable to cooperate fully   - VBG unremarkable no evidence of hypercarbia     . Left bundle branch block  -chronic stable  . Hypothyroidism - - Check TSH continue home medications at current dose  . Hypogammaglobulinemia ([place]) chronic followed by outpatient  . Chronic pain syndrome -avoid pain medications  . Chronic kidney disease mild stable  . Restless legs syndrome hold off on chronic medications for tonight given altered mental status  . Elevated troponin -discussed with cardiology most likely type II NSTEMI breath no contraindications will start heparin.  CT head non- acute Echo in am appreciate cardiology consutl  . Hypokalemia - will replace  Hypomagnesimia - will replace  Left leg swelling - order Doppler  . Headache - neck supple, no evidence of meningismus likely in the setting of sepsis, CT unremarkable   Other plan as per orders.  DVT prophylaxis:  heparin      Code Status:    Code Status: Partial Code  DNI  as per patient  I had  personally discussed CODE STATUS with patient     Family Communication:   Family not at  Bedside    Disposition Plan:     likely will need placement for rehabilitation  Following barriers for discharge:                            Electrolytes corrected  Afebrile, white count improving able to transition to PO antibiotics                             Will need to be able to tolerate PO                            Will likely need home health, home O2, set up                           Will need consultants to evaluate patient prior to discharge                        Would benefit from PT/OT eval prior to DC  Ordered                                                             Consults called: cardiology and PCCM are aware  Admission status:  ED Disposition    ED Disposition Condition [place]: [place] [100102]  Level of Care: Progressive [102]  Admit to Progressive based on following criteria: MULTISYSTEM THREATS such as stable sepsis, metabolic/electrolyte imbalance with or without encephalopathy that is responding to early treatment.  Interfacility transfer: Yes  May place patient in observation at [facility] or [place] if equivalent level of care is available:: No  Covid Evaluation: Confirmed COVID Negative  Diagnosis: SIRS (systemic inflammatory response syndrome) ([place]) [093267]  Admitting Physician: Toy Baker [[id number]]  Attending Physician: Toy Baker [[id number]]        inpatient     I Expect 2 midnight stay secondary to severity of patient's current illness need for inpatient interventions justified by the following:  hemodynamic instability despite optimal treatment (tachycardia   Tachypnea hypoxia,  )  Severe lab/radiological/exam abnormalities including:    hypokalemia and extensive comorbidities including:   Chronic  pain      malignancy, .     That are currently affecting medical management.   I expect  patient to be hospitalized for 2 midnights requiring inpatient medical care.  Patient is at high risk for adverse outcome (such as loss of life or disability) if not treated.  Indication for inpatient stay as follows:  Severe change from baseline regarding mental status Hemodynamic instability despite maximal medical therapy,     inability to maintain oral hydration    New or worsening hypoxia  Need for IV antibiotics, IV fluids,     Level of care   SDU tele indefinitely please discontinue once patient no longer qualifies COVID-19 Labs    Lab Results  Component Value Date   [place] NEGATIVE 01/14/2021     Precautions: admitted as  Covid Negative     PPE: Used by the provider:   N95  eye Goggles,  Gloves      Canna Nickelson 01/15/2021, 1:55 AM    [facility]     after 2 AM please page floor coverage PA If 7AM-7PM, please contact the day team taking care of the patient using [email or web address]   Patient was evaluated in the context of the global COVID-19 pandemic, which  necessitated consideration that the patient might be at risk for infection with the SARS-CoV-2 virus that causes COVID-19. Institutional protocols and algorithms that pertain to the evaluation of patients at risk for COVID-19 are in a state of rapid change based on information released by regulatory bodies including the CDC and federal and state organizations. These policies and algorithms were followed during the patient's care.

## 2021-01-14 NOTE — ED Provider Notes (Signed)
[facility] Provider Note  CSN: [id number] Arrival date & time: 01/14/21 1429    History Chief Complaint  Patient presents with  . Chills    HPI  Nuri [facility] is a 75 y.o. 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 Today she began having shaking chills, nausea and heart racing. Her husband had Covid about a month ago, still testing positive. She was seen here 3 days ago for LE edema and did not mention her other symptoms, workup then was negative. She was seen at [facility] 2 days ago for Covid test which was negative. She denies any CP, SOB, vomiting, diarrhea or dysuria. No rashes or tick bites.    Past Medical History:  Diagnosis Date  . Allergy   . Anemia   . Arachnoiditis   . Bipolar disorder ([place])   . Blood transfusion without reported diagnosis   . Cataract    removed  . Chronic back pain   . Chronic pain   . Chronic post-thoracotomy pain   . CKD (chronic kidney disease)   . GERD (gastroesophageal reflux disease)   . Hypogammaglobulinemia ([place])   . Hypotension   . Hypothyroid   . 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 Lung cancer ([place]) 2011, 2014  . Memory loss   . Migraine   . Opioid abuse ([place])   . Osteoporosis   . Presence of neurostimulator   . Recurrent upper respiratory infection (URI)   . Restless legs   . Sleep apnea   . Tremor   . Urge incontinence   . Urticaria    long time ago, nothing recent    Past Surgical History:  Procedure Laterality Date  . ABDOMINAL HYSTERECTOMY    . CARPAL TUNNEL RELEASE    . CATARACT EXTRACTION Bilateral 2019  . CHOLECYSTECTOMY    . COLONOSCOPY  2015   [facility]  . CT LUNG SCREENING  2018  . DG  BONE DENSITY ([place] HX)  2018  . DIAGNOSTIC MAMMOGRAM  2019  . GASTRIC BYPASS    . LUNG  CANCER SURGERY  02/2010  . MULTIPLE TOOTH EXTRACTIONS    . SPINAL CORD STIMULATOR IMPLANT      Family History  Problem Relation Age of Onset  . Hypertension Mother   . GER disease Mother   . Dementia Mother   . Osteoporosis Mother   . Arthritis Mother   . Bipolar disorder Mother   . Parkinson's disease Father   . Congestive Heart Failure Father   . Pneumonia Father   . Arthritis Father   . Dementia Father   . Depression Father   . Asthma Sister   . Depression Sister   . Bipolar disorder Brother   . Asthma Daughter   . Colon cancer Neg Hx   . Esophageal cancer Neg Hx   . Stomach cancer Neg Hx   . Rectal cancer Neg Hx     Social History   Tobacco Use  . Smoking status: Never Smoker  . Smokeless tobacco: Never Used  Vaping Use  . Vaping Use: Never used  Substance Use Topics  . Alcohol use: Not Currently  . Drug use: Never     Home Medications Prior to Admission medications   Medication Sig Start Date End Date Taking? Authorizing Provider  acetaminophen (TYLENOL) 500 MG tablet Take  1-2 tablets (500-1,000 mg total) by mouth every 8 (eight) hours as needed (Max dose 3000 mg in 24 hours). 06/11/20   [name], NP  Azelastine-Fluticasone 137-50 MCG/ACT SUSP ONE SPRAY EACH NOSTRIL TWICE A DAY AS NEEDED 10/01/20   Ambs, Kathrine Cords, FNP  baclofen (LIORESAL) 10 MG tablet Take 10 mg by mouth every 28 (twenty-eight) days. 06/03/20   [provider]  Buprenorphine HCl-Naloxone HCl 4-1 MG FILM Place 1 strip under the tongue 2 (two) times daily. Patient not taking: No sig reported 10/31/19   [provider]  Calcium Citrate 200 MG TABS Take 2 tablets by mouth daily. 12/02/16   [provider]  cycloSPORINE (RESTASIS) 0.05 % ophthalmic emulsion Place 1 drop into both eyes 2 (two) times daily.    [provider]  dexlansoprazole (DEXILANT) 60 MG capsule Take 1 capsule (60 mg total) by mouth daily. Crack open the capsule and pour into spoon prior  to ingestion. PLEASE SCHEDULE AN OFFICE VISIT FOR FURTHER REFILLS. Thank you 01/03/21   Armbruster, Carlota Raspberry, MD  Erenumab-aooe (AIMOVIG, 140 MG DOSE,) 70 MG/ML SOAJ Inject into the skin every 30 (thirty) days.    [provider]  estradiol (ESTRACE) 0.1 MG/GM vaginal cream 3 (three) times a week. 01/12/20   [provider]  ferrous sulfate 325 (65 FE) MG tablet Take 325 mg by mouth 2 (two) times daily with a meal.    [provider]  hydrOXYzine (VISTARIL) 25 MG capsule TAKE ONE CAPSULE BY MOUTH TWICE DAILY AS NEEDED 01/07/21   Thayer Headings, PMHNP  ibuprofen (ADVIL) 200 MG tablet Take 200 mg by mouth every 6 (six) hours as needed.    [provider]  immune globulin, human, (GAMMAGARD S/D LESS IGA) 10 g injection Inject into the vein every 21 ( twenty-one) days.     [provider]  INGREZZA 40 MG capsule Take 40 mg by mouth daily. 01/09/21   [provider]  lamoTRIgine (LAMICTAL) 200 MG tablet TAKE 1 TABLET BY MOUTH DAILY 01/01/21   Thayer Headings, PMHNP  LASTACAFT 0.25 % SOLN Apply 1 drop to eye daily. 01/19/19   [provider]  levothyroxine (SYNTHROID) 75 MCG tablet Take one tablet by mouth once daily 30 minutes before breakfast on empty stomach. 08/21/20   [name], NP  magnesium oxide (MAG-OX) 400 MG tablet Take 400 mg by mouth daily.     [provider]  mirtazapine (REMERON) 30 MG tablet Take 1/2 tablet at bedtime for one week, then increase to 1 tablet at bedtime 01/09/21   Thayer Headings, PMHNP  Multiple Vitamins-Minerals (MEGAVITE FRUITS & VEGGIES PO) Take by mouth.    [provider]  potassium chloride SA (KLOR-CON) 20 MEQ tablet TAKE TWO TABLETS BY MOUTH DAILY (MAY DISSOLVE IN WATER) 06/11/20   [name], NP  rOPINIRole (REQUIP) 1 MG tablet TAKE 1 TABLET BY MOUTH AT BEDTIME 09/09/20   Patel, Donika K, DO  saccharomyces boulardii (FLORASTOR) 250 MG capsule Take 1 capsule (250 mg total) by  mouth 2 (two) times daily. 01/02/21   Fargo, Amy E, NP  sucralfate (CARAFATE) 1 GM/10ML suspension Take 2 tsp by mouth three times daily as needed. 08/07/20   Ngetich, Dinah C, NP  tiZANidine (ZANAFLEX) 4 MG tablet Take 1 tablet (4 mg total) by mouth every 6 (six) hours as needed for muscle spasms. 11/24/20   Drenda Freeze, MD  torsemide (DEMADEX) 20 MG tablet TAKE 1 TABLET BY MOUTH  DAILY 07/23/20   [name], NP  triamcinolone cream (KENALOG) 0.1 % Apply 1 application topically 2 (two) times daily as needed.    [provider]  UNABLE TO FIND Med Name: [name] 1 by mouth daily for UTI prevention    [provider]  ZINC OXIDE, TOPICAL, 10 % CREA Apply 1 application topically in the morning and at bedtime. 12/06/20   Ngetich, Dinah C, NP     Allergies    Tetracycline, Corticosteroids, Cefixime, Ciprofloxacin, Doxycycline, Gabapentin, Isometheptene-dichloral-apap, Ketorolac, Prednisone, Promethazine, Stadol [butorphanol], Erythromycin base, and Propoxyphene   Review of Systems   Review of Systems A comprehensive review of systems was completed and negative except as noted in HPI.    Physical Exam BP (!) 144/81 (BP Location: Right Arm)   Pulse 91   Temp 98.6 F (37 C) (Oral)   Resp (!) 21   Ht 5\' 3"  (1.6 m)   Wt 52.2 kg   SpO2 99%   BMI 20.37 kg/m   Physical Exam Vitals and nursing note reviewed.  Constitutional:      Appearance: Normal appearance.  HENT:     Head: Normocephalic and atraumatic.     Nose: Nose normal.     Mouth/Throat:     Mouth: Mucous membranes are moist.  Eyes:     Extraocular Movements: Extraocular movements intact.     Conjunctiva/sclera: Conjunctivae normal.  Cardiovascular:     Rate and Rhythm: Tachycardia present.  Pulmonary:     Effort: Pulmonary effort is normal.     Breath sounds: Normal breath sounds. No wheezing or rales.  Abdominal:     General: Abdomen is flat.     Palpations: Abdomen is soft.     Tenderness:  There is no abdominal tenderness.  Musculoskeletal:        General: No swelling. Normal range of motion.     Cervical back: Neck supple. No rigidity.  Skin:    General: Skin is warm and dry.     Findings: No rash.  Neurological:     General: No focal deficit present.     Mental Status: She is alert.  Psychiatric:        Mood and Affect: Mood normal.      ED Results / Procedures / Treatments   Labs (all labs ordered are listed, but only abnormal results are displayed) Labs Reviewed  COMPREHENSIVE METABOLIC PANEL - Abnormal; Notable for the following components:      Result Value   Potassium 2.8 (*)    Glucose, Bld 136 (*)    Albumin 3.4 (*)    All other components within normal limits  CBC WITH DIFFERENTIAL/PLATELET - Abnormal; Notable for the following components:   Platelets 120 (*)    Lymphs Abs 0.3 (*)    All other components within normal limits  APTT - Abnormal; Notable for the following components:   aPTT 80 (*)    All other components within normal limits  URINALYSIS, ROUTINE W REFLEX MICROSCOPIC - Abnormal; Notable for the following components:   Hgb urine dipstick SMALL (*)    All other components within normal limits  URINALYSIS, MICROSCOPIC (REFLEX) - Abnormal; Notable for the following components:   Bacteria, UA RARE (*)    All other components within normal limits  MAGNESIUM - Abnormal; Notable for the following components:   Magnesium 1.4 (*)    All other components within normal limits  GLUCOSE, CAPILLARY - Abnormal; Notable for the following components:   Glucose-Capillary  142 (*)    All other components within normal limits  RESP PANEL BY RT-PCR (FLU A&B, COVID) ARPGX2  URINE CULTURE  CULTURE, BLOOD (ROUTINE X 2)  CULTURE, BLOOD (ROUTINE X 2)  RESPIRATORY PANEL BY PCR  LACTIC ACID, PLASMA  LACTIC ACID, PLASMA  PROTIME-INR  PROCALCITONIN  PROCALCITONIN  BASIC METABOLIC PANEL  BLOOD GAS, ARTERIAL  D-DIMER, QUANTITATIVE  TROPONIN I (HIGH  SENSITIVITY)    EKG EKG Interpretation  Date/Time:  Tuesday Jan 14 2021 14:57:57 EDT Ventricular Rate:  107 PR Interval:  140 QRS Duration: 116 QT Interval:  339 QTC Calculation: 453 R Axis:   99 Text Interpretation: Sinus tachycardia Probable left atrial enlargement Consider left ventricular hypertrophy Left bundle branch block Since last tracing Rate faster Confirmed by Calvert Cantor [phone]) on 01/14/2021 3:04:06 PM    Radiology DG Chest Port 1 View  Result Date: 01/14/2021 CLINICAL DATA:  Sepsis. EXAM: PORTABLE CHEST 1 VIEW COMPARISON:  Jan 11, 2021. FINDINGS: The heart size and mediastinal contours are within normal limits. Left subclavian Port-A-Cath is unchanged in position. No pneumothorax or pleural effusion is noted. Left lung is clear. Right infrahilar postoperative changes are again noted with associated scarring. The visualized skeletal structures are unremarkable. IMPRESSION: No definite acute abnormality Electronically Signed   By: Marijo Conception M.D.   On: 01/14/2021 15:39   CT Renal Stone Study  Result Date: 01/14/2021 CLINICAL DATA:  Abdomen pain hematuria EXAM: CT ABDOMEN AND PELVIS WITHOUT CONTRAST TECHNIQUE: Multidetector CT imaging of the abdomen and pelvis was performed following the standard protocol without IV contrast. COMPARISON:  CT 12/09/2020 FINDINGS: Lower chest: Lung bases demonstrate chronic right pleural thickening with calcifications. Slight rightward deviation of distal esophagus which contains radiopaque material. Hepatobiliary: Status post cholecystectomy. Stable intra and extrahepatic biliary dilatation. No focal hepatic abnormality. Pancreas: Unremarkable. No pancreatic ductal dilatation or surrounding inflammatory changes. Spleen: Normal in size without focal abnormality. Adrenals/Urinary Tract: Thickened adrenal glands without dominant mass. Kidneys show no hydronephrosis or ureteral stone. Distended urinary bladder Stomach/Bowel: Status post  gastric bypass. No obstructive changes. No acute bowel wall thickening. Vascular/Lymphatic: Moderate aortic atherosclerosis. No aneurysm. No suspicious nodes Reproductive: Status post hysterectomy. No adnexal masses. Other: Negative for pelvic effusion or free air. Musculoskeletal: No acute osseous abnormality. Left-sided sacral stimulator. Right posterior ascending thoracic stimulator with lead entering the canal at approximate T10 level. IMPRESSION: 1. No CT evidence for acute intra-abdominal or pelvic abnormality. 2. Chronic right pleural thickening with calcifications 3. Status post gastric bypass without adverse features at this time. Electronically Signed   By: Donavan Foil M.D.   On: 01/14/2021 20:05    Procedures Procedures  Medications Ordered in the ED Medications  vancomycin (VANCOREADY) IVPB 750 mg/150 mL (has no administration in time range)  piperacillin-tazobactam (ZOSYN) IVPB 3.375 g (has no administration in time range)  sodium chloride flush (NS) 0.9 % injection 10-40 mL (has no administration in time range)  sodium chloride flush (NS) 0.9 % injection 10-40 mL (has no administration in time range)  Chlorhexidine Gluconate Cloth 2 % PADS 6 each (has no administration in time range)  lactated ringers bolus 1,000 mL (0 mLs Intravenous Stopped 01/14/21 1851)  acetaminophen (TYLENOL) tablet 650 mg (650 mg Oral Given 01/14/21 1610)  ondansetron (ZOFRAN) injection 4 mg (4 mg Intravenous Given 01/14/21 1604)  ketorolac (TORADOL) 30 MG/ML injection 15 mg (15 mg Intravenous Given 01/14/21 1819)  potassium chloride SA (KLOR-CON) CR tablet 40 mEq (40 mEq Oral Given 01/14/21 1847)  vancomycin (VANCOCIN) IVPB 1000 mg/200 mL premix (1,000 mg Intravenous New Bag/Given 01/14/21 1953)  piperacillin-tazobactam (ZOSYN) IVPB 3.375 g (0 g Intravenous Stopped 01/14/21 2005)     MDM Rules/Calculators/A&P MDM Patient with fever, tachycardia and tachypnea. No clear etiology by history, recently tested  negative for Covid. Will check labs, including cultures, Covid/Flu swab and CXR. Begin IVF pending lactic acid result. APAP for fever, Zofran for nausea.   ED Course  I have reviewed the triage vital signs and the nursing notes.  Pertinent labs & imaging results that were available during my care of the patient were reviewed by me and considered in my medical decision making (see chart for details).  Clinical Course as of 01/14/21 2221  Tue Jan 14, 2021  1548 CXR is neg for infiltrate/consolidation.  [CS]  1628 UA neg for signs of infection. CBC is normal. Lactic acid is negative.  [CS]  4097 CMP with mild hypokalemia, otherwise normal.  [CS]  1710 HR is improved with lower temp.  [CS]  [id number] Patient with continued headache and body aches. Has history of migraines. No improvement with APAP. She has an allergy listed to ketorolac but she is unsure why this is listed, does not think she is allergic to it, takes oral NSAIDs without difficulty and the reaction listed is the same as for corticosteroids. Will give a dose of toradol and continue to monitor in the ED. Given her immunoglobulin deficiency and fever of unknown origin, will discuss admission with hospitalist.  [CS]  1901 Spoke with Dr. Roel Cluck Hospitalist, who will accept for admission. Requests CT Abd/Pel to eval mild hematuria and for occult source of infection. Recommends initiating broad spectrum Abx given her immunodeficiency pending culture results. Procalcitonin ordered as well.  [CS]    Clinical Course User Index [CS] Truddie Hidden, MD    Final Clinical Impression(s) / ED Diagnoses Final diagnoses:  Acute respiratory failure with hypoxia [facility])    Rx / DC Orders ED Discharge Orders    None       Truddie Hidden, MD 01/14/21 2221

## 2021-01-14 NOTE — ED Triage Notes (Signed)
Pt BIB EMS. Husband tested positive for COVID on Saturday. Today having chills, headache and nausea & abdominal pain. Took aspirin & ibuprofen pta. Denies chest pain/SOB. Hyperventilating in triage d/t headache.

## 2021-01-14 NOTE — Consult Note (Signed)
NAME:  Megan Brewer, MRN:  [id number], DOB:  [birth year], LOS: 0 ADMISSION DATE:  01/14/2021, CONSULTATION DATE:  01/14/2021 REFERRING MD:  Dr. Roel Cluck, [facility], CHIEF COMPLAINT:  Pain   History of Present Illness:  75 yo female presented to ER on 5/31 with chills, headache, nausea, abdominal pain, chest pain, dyspnea, and leg swelling.  She had negative COVID 19 test on 5/29 and again on 5/31.  She was found to have elevated troponin, elevated procalcitonin and fever 101.33F.  Started on antibiotics.  She follows commands, but has difficulty staying awake during evaluation.      Pertinent  Medical History  NSCLC dx 2011 s/p surgery and chemo at [facility], Common variable immune deficiency, Bipolar, Anemia, Chronic pain, s/p neurostimulator, CKD, GERD, Hypothyroidism, Migraine headache, Osteoporosis, Restless legs syndrome, OSA, Tremor, Urticaria, LBBB  Significant Hospital Events: Including procedures, antibiotic start and stop dates in addition to other pertinent events   . Transfer from [facility], start vancomycin/zosyn/doxycycline  Interim History / Subjective:    Objective   Blood pressure (!) 144/81, pulse 91, temperature 98.6 F (37 C), temperature source Oral, resp. rate (!) 21, height 5\' 3"  (1.6 m), weight 52.2 kg, SpO2 99 %.        Intake/Output Summary (Last 24 hours) at 01/14/2021 2302 Last data filed at 01/14/2021 2015 Gross per 24 hour  Intake 5674.8 ml  Output --  Net 5674.8 ml   Filed Weights   01/14/21 1437  Weight: 52.2 kg    Examination:  General - somnolent Eyes - pupils reactive ENT - no sinus tenderness, no stridor, dry mucosa Cardiac - regular rate/rhythm, no murmur Chest - shallow respiratory pattern, no wheeze/rales Abdomen - soft, non tender, + bowel sounds Extremities - 1+ edema Skin - no rashes Neuro - wakes up with stimulation, able to state her name, moves extremities, follows commands, but then quickly falls back to sleep  Renal  stone study - no acute findings CXR - Rt lung surgical changes, no acute findings ABG - 7.40/44.2/153 K 2.9, Na 138, Creatinine 0.83 Troponin 1493 Procalcitonin 4.97 Plt 120 Doppler Rt leg 01/11/21 - no DVT  Resolved Hospital Problem list     Assessment & Plan:   Fever with elevated procalcitonin. - concern for sepsis which would be present on admission - f/u cultures - continue ABx  Elevated troponin. Hx of LBBB. - f/u Echo  Acute metabolic encephalopathy likely from sepsis. Hx of Chronic pain, RLS, Bipolar. - monitor mental status - f/u CT head - outpt med list includes baclofen, buprenorphine-naloxone, vistaril, lamictal, remeron, requip, zanaflex  Common variable immune deficiency. - followed by Dr. Darliss Cheney with immunology at [facility] - gets gammagrad every 3 weeks as outpt  Hx of hypothyroidism. - per primary team  GERD with hx of gastric bypass. - per primary team  Hypokalemia. - f/u BMET  Mild thrombocytopenia. - f/u CBC  Best practice (right click and "Reselect all SmartList Selections" daily)  Diet:  NPO Pain/Anxiety/Delirium protocol (if indicated): No VAP protocol (if indicated): Not indicated DVT prophylaxis: SCD GI prophylaxis: PPI Glucose control:  SSI No Central venous access:  N/A Arterial line:  N/A Foley:  N/A Mobility:  bed rest  PT consulted: N/A Last date of multidisciplinary goals of care discussion [1] Code Status:  limited; no intubation Disposition: ICU/SDU  Labs   CBC: Recent Labs  Lab 01/11/21 1214 01/14/21 1547  WBC 4.9 6.0  NEUTROABS 3.2 5.6  HGB 13.7 13.6  HCT 42.3 40.4  MCV 90.8 88.6  PLT 165 120*    Basic Metabolic Panel: Recent Labs  Lab 01/11/21 1214 01/14/21 1547 01/14/21 2158  NA 135 135 138  K 3.9 2.8* 2.9*  CL 101 101 104  CO2 25 23 25   GLUCOSE 102* 136* 142*  BUN 23 23 19   CREATININE 0.90 0.81 0.83  CALCIUM 8.8* 8.9 8.6*  MG  --  1.4*  --    GFR: Estimated Creatinine Clearance:  50.5 mL/min (by C-G formula based on SCr of 0.83 mg/dL). Recent Labs  Lab 01/11/21 1214 01/14/21 1547 01/14/21 1755 01/14/21 1922  PROCALCITON  --   --   --  3.55  WBC 4.9 6.0  --   --   LATICACIDVEN  --  1.3 1.0  --     Liver Function Tests: Recent Labs  Lab 01/11/21 1214 01/14/21 1547  AST 39 33  ALT 36 29  ALKPHOS 64 72  BILITOT 0.5 0.4  PROT 8.0 7.2  ALBUMIN 3.6 3.4*   No results for input(s): LIPASE, AMYLASE in the last 168 hours. No results for input(s): AMMONIA in the last 168 hours.  ABG    Component Value Date/Time   PHART 7.403 01/14/2021 2207   PCO2ART 44.2 01/14/2021 2207   PO2ART 153 (H) 01/14/2021 2207   HCO3 27.1 01/14/2021 2207   O2SAT 98.4 01/14/2021 2207     Coagulation Profile: Recent Labs  Lab 01/14/21 1547  INR 0.9    Cardiac Enzymes: No results for input(s): CKTOTAL, CKMB, CKMBINDEX, TROPONINI in the last 168 hours.  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   Surgical History:   Past Surgical History:  Procedure Laterality Date  . ABDOMINAL HYSTERECTOMY    . CARPAL TUNNEL RELEASE    . CATARACT EXTRACTION Bilateral 2019  . CHOLECYSTECTOMY    . COLONOSCOPY  2015   [facility]  . CT LUNG SCREENING  2018  . DG  BONE  DENSITY ([place] HX)  2018  . DIAGNOSTIC MAMMOGRAM  2019  . GASTRIC BYPASS    . LUNG CANCER SURGERY  02/2010  . MULTIPLE TOOTH EXTRACTIONS    . SPINAL CORD STIMULATOR IMPLANT       Social History:   reports that she has never smoked. She has never used smokeless tobacco. She reports previous alcohol use. She reports that she does not use drugs.   Family History:  Her family history includes Arthritis in her father and mother; Asthma in her daughter and sister; Bipolar disorder in her brother and mother; Congestive Heart Failure in her father; Dementia in her father and mother; Depression in her father and sister; GER disease in her mother; Hypertension in her mother; Osteoporosis in her mother; Parkinson's disease in her father; Pneumonia in her father. There is no history of Colon cancer, Esophageal cancer, Stomach cancer, or Rectal cancer.   Allergies Allergies  Allergen Reactions  . Tetracycline  Nausea Only    Causes ringing in ears, dizziness, migraine, nausea  . Corticosteroids Other (See Comments)    12/02/2016 interview by Melissa Montane: Oral dosing causes migraines/nausea/tinnitus. Prev tolerated IV and intranasal admin w/o difficulty.  . Cefixime Other (See Comments)    Headache   . Ciprofloxacin Other (See Comments)    Headache, dizziness, ringing in the ears  . Doxycycline Other (See Comments)    unknown  . Gabapentin     Throat swells/closes  . Isometheptene-Dichloral-Apap Other (See Comments)    unknown  . Ketorolac     12/02/2016 interview by WUJ: Oral dosing prednisone/corticosteroids causes migraines/nausea/tinnitus. Prev tolerated IV and intranasal admin w/o difficulty.  . Prednisone Other (See Comments)    Migraine, dizzy, ringing in ears-can take it IV 12/02/2016 interview by [name]: Oral prednisone dosing causes migraines/nausea/tinnitus. Prev tolerated IV and intranasal admin w/o difficulty.  . Promethazine Other (See Comments)    causes severe abdominal pain when taken IV;  however she can take PO or IM  . Stadol [Butorphanol]     Cerebral pain  . Erythromycin Base Diarrhea and Itching    Severe diarrhea  . Propoxyphene Nausea Only     Home Medications  Prior to Admission medications   Medication Sig Start Date End Date Taking? Authorizing Provider  acetaminophen (TYLENOL) 500 MG tablet Take 1-2 tablets (500-1,000 mg total) by mouth every 8 (eight) hours as needed (Max dose 3000 mg in 24 hours). 06/11/20   [name], NP  Azelastine-Fluticasone 137-50 MCG/ACT SUSP ONE SPRAY EACH NOSTRIL TWICE A DAY AS NEEDED 10/01/20   Ambs, Kathrine Cords, FNP  baclofen (LIORESAL) 10 MG tablet Take 10 mg by mouth every 28 (twenty-eight) days. 06/03/20   [provider]  Buprenorphine HCl-Naloxone HCl 4-1 MG FILM Place 1 strip under the tongue 2 (two) times daily. Patient not taking: No sig reported 10/31/19   [provider]  Calcium Citrate 200 MG TABS Take 2 tablets by mouth daily. 12/02/16   [provider]  cycloSPORINE (RESTASIS) 0.05 % ophthalmic emulsion Place 1 drop into both eyes 2 (two) times daily.    [provider]  dexlansoprazole (DEXILANT) 60 MG capsule Take 1 capsule (60 mg total) by mouth daily. Crack open the capsule and pour into spoon prior to ingestion. PLEASE SCHEDULE AN OFFICE VISIT FOR FURTHER REFILLS. Thank you 01/03/21   Armbruster, Carlota Raspberry, MD  Erenumab-aooe (AIMOVIG, 140 MG DOSE,) 70 MG/ML SOAJ Inject into the skin every 30 (thirty) days.    [provider]  estradiol (ESTRACE) 0.1 MG/GM vaginal cream 3 (three) times a week. 01/12/20   [provider]  ferrous sulfate 325 (65 FE) MG tablet Take 325 mg by mouth 2 (two) times daily with a meal.    [provider]  hydrOXYzine (VISTARIL) 25 MG capsule TAKE ONE CAPSULE BY MOUTH TWICE DAILY AS NEEDED 01/07/21   Thayer Headings, PMHNP  ibuprofen (ADVIL) 200 MG tablet Take 200 mg by mouth every 6 (six) hours as needed.    [provider]   immune globulin, human, (GAMMAGARD S/D LESS IGA) 10 g injection Inject into the vein every 21 ( twenty-one) days.     [provider]  INGREZZA 40 MG capsule Take 40 mg by mouth daily. 01/09/21   [provider]  lamoTRIgine (LAMICTAL) 200 MG tablet TAKE 1 TABLET BY MOUTH DAILY 01/01/21   Thayer Headings, PMHNP  LASTACAFT 0.25 % SOLN Apply 1 drop to eye daily. 01/19/19  [provider]  levothyroxine (SYNTHROID) 75 MCG tablet Take one tablet by mouth once daily 30 minutes before breakfast on empty stomach. 08/21/20   [name], NP  magnesium oxide (MAG-OX) 400 MG tablet Take 400 mg by mouth daily.     [provider]  mirtazapine (REMERON) 30 MG tablet Take 1/2 tablet at bedtime for one week, then increase to 1 tablet at bedtime 01/09/21   Thayer Headings, PMHNP  Multiple Vitamins-Minerals (MEGAVITE FRUITS & VEGGIES PO) Take by mouth.    [provider]  potassium chloride SA (KLOR-CON) 20 MEQ tablet TAKE TWO TABLETS BY MOUTH DAILY (MAY DISSOLVE IN WATER) 06/11/20   [name], NP  rOPINIRole (REQUIP) 1 MG tablet TAKE 1 TABLET BY MOUTH AT BEDTIME 09/09/20   Patel, Donika K, DO  saccharomyces boulardii (FLORASTOR) 250 MG capsule Take 1 capsule (250 mg total) by mouth 2 (two) times daily. 01/02/21   Fargo, Amy E, NP  sucralfate (CARAFATE) 1 GM/10ML suspension Take 2 tsp by mouth three times daily as needed. 08/07/20   Ngetich, Dinah C, NP  tiZANidine (ZANAFLEX) 4 MG tablet Take 1 tablet (4 mg total) by mouth every 6 (six) hours as needed for muscle spasms. 11/24/20   Drenda Freeze, MD  torsemide (DEMADEX) 20 MG tablet TAKE 1 TABLET BY MOUTH DAILY 07/23/20   [name], NP  triamcinolone cream (KENALOG) 0.1 % Apply 1 application topically 2 (two) times daily as needed.    [provider]  UNABLE TO FIND Med Name: [name] 1 by mouth daily for UTI prevention    [provider]  ZINC OXIDE, TOPICAL, 10 % CREA Apply 1  application topically in the morning and at bedtime. 12/06/20   Ngetich, Nelda Bucks, NP     Signature:  Chesley Mires, MD [place] Pager - [phone] 01/14/2021, 11:47 PM

## 2021-01-14 NOTE — Progress Notes (Addendum)
Received pt on stretcher from [facility], pt aox4, able to transfer self from bed to [facility], pt c/o back pain, no respiratory distress noted O2 sat 99% on RA, will continue plan of care.  At 2210 pt transferred to higher level of care secondary to noted changes status after speaking with MD, rapid response called to assist RN and MD at bedside.

## 2021-01-15 ENCOUNTER — Observation Stay (HOSPITAL_BASED_OUTPATIENT_CLINIC_OR_DEPARTMENT_OTHER): Payer: Medicare PPO

## 2021-01-15 ENCOUNTER — Encounter (HOSPITAL_COMMUNITY): Payer: Self-pay | Admitting: Internal Medicine

## 2021-01-15 ENCOUNTER — Observation Stay (HOSPITAL_COMMUNITY): Payer: Medicare PPO

## 2021-01-15 DIAGNOSIS — G2581 Restless legs syndrome: Secondary | ICD-10-CM

## 2021-01-15 DIAGNOSIS — I447 Left bundle-branch block, unspecified: Secondary | ICD-10-CM | POA: Diagnosis not present

## 2021-01-15 DIAGNOSIS — R778 Other specified abnormalities of plasma proteins: Secondary | ICD-10-CM

## 2021-01-15 DIAGNOSIS — E876 Hypokalemia: Secondary | ICD-10-CM

## 2021-01-15 DIAGNOSIS — Z8639 Personal history of other endocrine, nutritional and metabolic disease: Secondary | ICD-10-CM

## 2021-01-15 DIAGNOSIS — R7989 Other specified abnormal findings of blood chemistry: Secondary | ICD-10-CM | POA: Diagnosis not present

## 2021-01-15 DIAGNOSIS — I361 Nonrheumatic tricuspid (valve) insufficiency: Secondary | ICD-10-CM

## 2021-01-15 DIAGNOSIS — N189 Chronic kidney disease, unspecified: Secondary | ICD-10-CM

## 2021-01-15 DIAGNOSIS — R609 Edema, unspecified: Secondary | ICD-10-CM | POA: Diagnosis not present

## 2021-01-15 DIAGNOSIS — G4733 Obstructive sleep apnea (adult) (pediatric): Secondary | ICD-10-CM

## 2021-01-15 DIAGNOSIS — G894 Chronic pain syndrome: Secondary | ICD-10-CM

## 2021-01-15 DIAGNOSIS — A419 Sepsis, unspecified organism: Secondary | ICD-10-CM | POA: Diagnosis not present

## 2021-01-15 DIAGNOSIS — I351 Nonrheumatic aortic (valve) insufficiency: Secondary | ICD-10-CM | POA: Diagnosis not present

## 2021-01-15 DIAGNOSIS — G9341 Metabolic encephalopathy: Secondary | ICD-10-CM | POA: Diagnosis present

## 2021-01-15 DIAGNOSIS — K219 Gastro-esophageal reflux disease without esophagitis: Secondary | ICD-10-CM

## 2021-01-15 DIAGNOSIS — D696 Thrombocytopenia, unspecified: Secondary | ICD-10-CM

## 2021-01-15 DIAGNOSIS — F319 Bipolar disorder, unspecified: Secondary | ICD-10-CM

## 2021-01-15 DIAGNOSIS — Z9884 Bariatric surgery status: Secondary | ICD-10-CM

## 2021-01-15 DIAGNOSIS — R6520 Severe sepsis without septic shock: Secondary | ICD-10-CM | POA: Diagnosis not present

## 2021-01-15 DIAGNOSIS — R6510 Systemic inflammatory response syndrome (SIRS) of non-infectious origin without acute organ dysfunction: Secondary | ICD-10-CM | POA: Diagnosis not present

## 2021-01-15 DIAGNOSIS — D839 Common variable immunodeficiency, unspecified: Secondary | ICD-10-CM

## 2021-01-15 DIAGNOSIS — E039 Hypothyroidism, unspecified: Secondary | ICD-10-CM

## 2021-01-15 DIAGNOSIS — I340 Nonrheumatic mitral (valve) insufficiency: Secondary | ICD-10-CM

## 2021-01-15 DIAGNOSIS — Z8679 Personal history of other diseases of the circulatory system: Secondary | ICD-10-CM

## 2021-01-15 LAB — BLOOD CULTURE ID PANEL (REFLEXED) - BCID2

## 2021-01-15 LAB — GLUCOSE, CAPILLARY: Glucose-Capillary: 97 mg/dL (ref 70–99)

## 2021-01-15 LAB — ECHOCARDIOGRAM COMPLETE
Area-P 1/2: 3.42 cm2
Height: 63 in
Weight: 1840 oz

## 2021-01-15 LAB — CBC WITH DIFFERENTIAL/PLATELET
Abs Immature Granulocytes: 0.02 10*3/uL (ref 0.00–0.07)
Basophils Absolute: 0 10*3/uL (ref 0.0–0.1)
Basophils Relative: 0 %
Eosinophils Absolute: 0 10*3/uL (ref 0.0–0.5)
Eosinophils Relative: 0 %
HCT: 38.3 % (ref 36.0–46.0)
Hemoglobin: 12.9 g/dL (ref 12.0–15.0)
Immature Granulocytes: 0 %
Lymphocytes Relative: 2 %
Lymphs Abs: 0.1 10*3/uL — ABNORMAL LOW (ref 0.7–4.0)
MCH: 30.6 pg (ref 26.0–34.0)
MCHC: 33.7 g/dL (ref 30.0–36.0)
MCV: 91 fL (ref 80.0–100.0)
Monocytes Absolute: 0.3 10*3/uL (ref 0.1–1.0)
Monocytes Relative: 4 %
Neutro Abs: 6 10*3/uL (ref 1.7–7.7)
Neutrophils Relative %: 94 %
Platelets: 105 10*3/uL — ABNORMAL LOW (ref 150–400)
RBC: 4.21 MIL/uL (ref 3.87–5.11)
RDW: 13.5 % (ref 11.5–15.5)
WBC: 6.4 10*3/uL (ref 4.0–10.5)
nRBC: 0 % (ref 0.0–0.2)

## 2021-01-15 LAB — COMPREHENSIVE METABOLIC PANEL
ALT: 29 U/L (ref 0–44)
AST: 33 U/L (ref 15–41)
Albumin: 2.9 g/dL — ABNORMAL LOW (ref 3.5–5.0)
Alkaline Phosphatase: 56 U/L (ref 38–126)
Anion gap: 4 — ABNORMAL LOW (ref 5–15)
BUN: 15 mg/dL (ref 8–23)
CO2: 25 mmol/L (ref 22–32)
Calcium: 8.1 mg/dL — ABNORMAL LOW (ref 8.9–10.3)
Chloride: 106 mmol/L (ref 98–111)
Creatinine, Ser: 0.65 mg/dL (ref 0.44–1.00)
GFR, Estimated: >60 mL/min (ref >60)
Glucose, Bld: 121 mg/dL — ABNORMAL HIGH (ref 70–99)
Potassium: 4 mmol/L (ref 3.5–5.1)
Sodium: 135 mmol/L (ref 135–145)
Total Bilirubin: 0.4 mg/dL (ref 0.3–1.2)
Total Protein: 6.5 g/dL (ref 6.5–8.1)

## 2021-01-15 LAB — CK: Total CK: 78 U/L (ref 38–234)

## 2021-01-15 LAB — TROPONIN I (HIGH SENSITIVITY): Troponin I (High Sensitivity): 1089 ng/L (ref <18)

## 2021-01-15 LAB — MRSA PCR SCREENING: MRSA by PCR: NEGATIVE

## 2021-01-15 LAB — TSH: TSH: 2.99 u[IU]/mL (ref 0.350–4.500)

## 2021-01-15 LAB — PHOSPHORUS
Phosphorus: 3.2 mg/dL (ref 2.5–4.6)
Phosphorus: 2.8 mg/dL (ref 2.5–4.6)

## 2021-01-15 LAB — HEPARIN LEVEL (UNFRACTIONATED): Heparin Unfractionated: 0.43 IU/mL (ref 0.30–0.70)

## 2021-01-15 LAB — MAGNESIUM: Magnesium: 3 mg/dL — ABNORMAL HIGH (ref 1.7–2.4)

## 2021-01-15 LAB — PREALBUMIN: Prealbumin: 12.3 mg/dL — ABNORMAL LOW (ref 18–38)

## 2021-01-15 LAB — CLOSTRIDIUM DIFFICILE BY PCR: Toxigenic C. Difficile by PCR: POSITIVE — AB

## 2021-01-15 MED ORDER — MAGNESIUM SULFATE 2 GM/50ML IV SOLN
2.0000 g | Freq: Once | INTRAVENOUS | Status: AC
  Administered 2021-01-15: 2 g via INTRAVENOUS
  Filled 2021-01-15: qty 50

## 2021-01-15 MED ORDER — METOCLOPRAMIDE HCL 5 MG/ML IJ SOLN
10.0000 mg | Freq: Once | INTRAMUSCULAR | Status: AC
  Administered 2021-01-15: 10 mg via INTRAVENOUS
  Filled 2021-01-15: qty 2

## 2021-01-15 MED ORDER — HEPARIN BOLUS VIA INFUSION
3000.0000 [IU] | Freq: Once | INTRAVENOUS | Status: AC
  Administered 2021-01-15: 3000 [IU] via INTRAVENOUS
  Filled 2021-01-15: qty 3000

## 2021-01-15 MED ORDER — HEPARIN SODIUM (PORCINE) 5000 UNIT/ML IJ SOLN
5000.0000 [IU] | Freq: Three times a day (TID) | INTRAMUSCULAR | Status: DC
  Administered 2021-01-15 – 2021-01-16 (×3): 5000 [IU] via SUBCUTANEOUS
  Filled 2021-01-15 (×3): qty 1

## 2021-01-15 MED ORDER — PANTOPRAZOLE SODIUM 40 MG PO TBEC
40.0000 mg | DELAYED_RELEASE_TABLET | Freq: Every day | ORAL | Status: DC
  Administered 2021-01-15 – 2021-01-22 (×8): 40 mg via ORAL
  Filled 2021-01-15 (×8): qty 1

## 2021-01-15 MED ORDER — VANCOMYCIN 50 MG/ML ORAL SOLUTION: 125.0000 mg | Freq: Four times a day (QID) | ORAL | Status: DC

## 2021-01-15 MED ORDER — HEPARIN (PORCINE) 25000 UT/250ML-% IV SOLN
600.0000 [IU]/h | INTRAVENOUS | Status: DC
  Administered 2021-01-15: 600 [IU]/h via INTRAVENOUS
  Filled 2021-01-15: qty 250

## 2021-01-15 MED ORDER — VANCOMYCIN HCL 125 MG PO CAPS
125.0000 mg | ORAL_CAPSULE | Freq: Four times a day (QID) | ORAL | Status: DC
  Administered 2021-01-15 – 2021-01-16 (×3): 125 mg via ORAL
  Filled 2021-01-15 (×5): qty 1

## 2021-01-15 MED ORDER — ORAL CARE MOUTH RINSE
15.0000 mL | Freq: Two times a day (BID) | OROMUCOSAL | Status: DC
  Administered 2021-01-15 – 2021-01-22 (×15): 15 mL via OROMUCOSAL

## 2021-01-15 MED ORDER — SODIUM CHLORIDE 0.9 % IV SOLN: INTRAVENOUS | Status: DC

## 2021-01-15 MED ORDER — DIPHENHYDRAMINE HCL 50 MG/ML IJ SOLN
25.0000 mg | Freq: Once | INTRAMUSCULAR | Status: AC
  Administered 2021-01-15: 25 mg via INTRAVENOUS
  Filled 2021-01-15: qty 1

## 2021-01-15 MED ORDER — KETOROLAC TROMETHAMINE 15 MG/ML IJ SOLN
15.0000 mg | Freq: Once | INTRAMUSCULAR | Status: AC
  Administered 2021-01-15: 15 mg via INTRAVENOUS
  Filled 2021-01-15: qty 1

## 2021-01-15 MED ORDER — OXYCODONE HCL 5 MG PO TABS
5.0000 mg | ORAL_TABLET | ORAL | Status: DC | PRN
  Administered 2021-01-15 – 2021-01-21 (×21): 5 mg via ORAL
  Filled 2021-01-15 (×24): qty 1

## 2021-01-15 NOTE — Progress Notes (Signed)
  Echocardiogram 2D Echocardiogram has been performed.  Darlina Sicilian M 01/15/2021, 8:54 AM

## 2021-01-15 NOTE — Significant Event (Signed)
Rapid Response Event Note   Reason for Call :  Called by MD who was talking to patient when she went unconscious during conversation  Initial Focused Assessment:  Pt was noted to be lethargic but arousable upon assessment. oriented x4. Pt had equal strength bilaterally in upper and lower extremities. Pupils were equal and reactive. cbg was checked and was WNL. EKG was checked to see if any changes were present, no acute abnormalities seen. MD was at bedside through out assessment   Interventions:  Pt was transported to step down per MD order  Plan of Care:  Consult ordered for CCM and head ct order in  Event Summary:   MD Notified: MD present at bedside throughout event Call Time: 2120 Arrival [id number] End Time: [place], RN

## 2021-01-15 NOTE — TOC Initial Note (Signed)
Transition of Care [facility]) - Initial/Assessment Note    Patient Details  Name: Megan Brewer MRN: [id number] Date of Birth: 1946/03/29  [facility]) CM/SW Contact:    Leeroy Cha, RN Phone Number: 01/15/2021, 9:41 AM  Clinical Narrative:                 75 y.o. female with medical history significant of lung cancer sp ressection, immunoglobulin deficiency (PTA Gammagard infusions), recurrent UTi Sp Gastric bipass 8-10 y ago    Presented with   generalized fatigue chills tachycardia febrile.  Negative for COVID. Husband tested positive for COVID a month ago but is currently recovered. No chest pain or shortness of breath. Noted significant generalized pain   Reports she was having generalized pain but also chronic chest pain   this morning Reports headache front and top  Not congested Reports back pain from top to bottom Reports a little bit of cough no sneezing  She was on chronic pain medications but she quit few weeks ago No tick bites No dysuria, reports some nausea Reports recent she was diagnosed with gastroenteritis for few weeks She still has occasional diarrhea  Pt had a neg stress test in 2020  Infectious risk factors:  Reports  fever,  dry cough, chest pain, diarrhea   Has  been vaccinated against COVID and boosted PLAN: to return to home with self care.  Expected Discharge Plan: Home/Self Care Barriers to Discharge: Continued Medical Work up   Patient Goals and CMS Choice Patient states their goals for this hospitalization and ongoing recovery are:: to go home CMS Medicare.gov Compare Post Acute Care list provided to:: Patient    Expected Discharge Plan and Services Expected Discharge Plan: Home/Self Care   Discharge Planning Services: CM Consult   Living arrangements for the past 2 months: Single Family Home                                      Prior Living Arrangements/Services Living arrangements for the past  2 months: Single Family Home Lives with:: Spouse Patient language and need for interpreter reviewed:: Yes Do you feel safe going back to the place where you live?: Yes            Criminal Activity/Legal Involvement Pertinent to Current Situation/Hospitalization: No - Comment as needed  Activities of Daily Living      Permission Sought/Granted                  Emotional Assessment Appearance:: Appears stated age Attitude/Demeanor/Rapport: Engaged Affect (typically observed): Calm Orientation: : Oriented to Place,Oriented to Self,Oriented to  Time,Oriented to Situation Alcohol / Substance Use: Not Applicable Psych Involvement: No (comment)  Admission diagnosis:  SIRS (systemic inflammatory response syndrome) ([place]) [R65.10] Patient Active Problem List   Diagnosis Date Noted  . Acute metabolic encephalopathy 24/26/8341  . SIRS (systemic inflammatory response syndrome) ([place]) 01/14/2021  . Elevated troponin 01/14/2021  . Hypokalemia 01/14/2021  . Headache 01/14/2021  . Chest discomfort 03/22/2019  . Weight loss 01/27/2019  . Acquired hypothyroidism 01/27/2019  . Esophagitis 01/27/2019  . Intractable migraine without status migrainosus 01/27/2019  . Osteoporosis 01/27/2019  . Chronic sinusitis 12/28/2018  . Food intolerance/GI symptoms 12/28/2018  . Acute maxillary sinusitis 12/28/2018  . Lower leg edema 12/20/2018  . Chronic pain of both shoulders 05/24/2018  . Bilateral leg edema 05/24/2018  . Chronic pain syndrome  03/31/2018  . Left bundle branch block 10/28/2017  . Palpitations 10/28/2017  . History of gastric bypass 05/21/2017  . Polypharmacy 02/11/2017  . Senile nuclear sclerosis 11/26/2016  . Chronic post-thoracotomy pain 04/09/2014  . Chronic kidney disease 04/09/2014  . Sleep disturbances 09/20/2012  . Restless legs syndrome 07/07/2012  . Hypertonicity of bladder 05/23/2012  . Essential tremor 04/01/2012  . Memory loss 04/01/2012  . Incomplete emptying  of bladder 01/14/2012  . Urge incontinence 10/28/2011  . Tension type headache 06/10/2011  . Mixed urge and stress incontinence 05/28/2011  . Hypogammaglobulinemia ([place]) 06/22/2010  . Severe episode of recurrent major depressive disorder, without psychotic features ([place]) 06/22/2010  . Hypothyroidism 02/12/2010  . 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[place]) 01/31/2010  . ARACHNOIDITIS 01/31/2010  . OBSTRUCTIVE SLEEP APNEA 01/31/2010  . Chronic rhinitis 01/31/2010  . Arachnoiditis 01/31/2010   PCP:  [name], NP Pharmacy:   [facility], [place] - [address] [place] [address] Phone: [phone] Fax: [phone]     Social Determinants of Health (SDOH) Interventions    Readmission Risk Interventions No flowsheet data found.

## 2021-01-15 NOTE — Consult Note (Addendum)
Cardiology Consultation:   Patient ID: Megan Brewer MRN: [id number]; DOB: 07-27-1946  Admit date: 01/14/2021 Date of Consult: 01/15/2021  PCP:  Megan Chandler, NP   [facility] Providers Cardiologist:  New to Megan Brewer     Patient Profile:   Megan Brewer is a 75 y.o. female with a hx of  chronic pain syndrome, LBBB, polypharmacy, restless leg syndrome, essential tremor, GERD, hypogammaglobulinemia on q3week gammagard infusion, depression, hypothyroidism, OSA, migraine, gastric bypass, right lung cancer s/p lobectomy and chemotherapy (8ys ago), who is being seen 01/15/2021 for the evaluation of elevated troponin at the request of Megan Brewer.   History of Present Illness:   Megan Brewer has no known cardiac disease and follows no cardiologist outpatient.   She had complaints of intermittent heart palpitation occurring at night with substernal chest discomfort to her PCP at [facility] in 2018-2019.  Her EKG had revealed left bundle branch block that was felt new since 2015.  She was referred to cardiology.  Zio patch study revealed a few short runs of SVT that did not correlate with her symptoms. She was seen by Megan Brewer 03/22/2019 for chest discomfort, at the time she had multiple ER visits for chest pain.  Lexiscan myoveiw from 03/29/19 was a low risk study and normal.  She presented to ER on 01/14/2021 via EMS, who reports her husband was tested positive for COVID on Saturday, 01/12/2021 (but reportedly has had COVID a month ago).  During encounter, she is alert and oriented x3, follow commands appropriately.Patient states she was having shortness of breath, significant chills, headache, severe nausea with emesis yesterday.  She noted having some mid-sternum chest pain with the chills and SOB. She states pain is non-radiating, not triggered by exertion, nor relieved by resting. She states she was feeling very poorly yesterday and has never felt like that before. She had  resolved chest pain today, states feeling overall better. She does not recall any recent chest pain, endorses chronic SOB and dry cough since her lung cancer. She states she is profoundly weak and fatigued at this time. She reports chronic leg edema of unclear etiology, takes 20mg  Lasix daily at home.   Diagnostic work-up at admission showed hypokalemia 2.8, hypomagnesemia 1.4,  CBC with thrombocytopenia of PLT 120k.  INR 0.9.  Flu and COVID swab negative.  Urinalysis unremarkable for infection.  High sensitive troponin [id number].  Lactic acid x2 WNL.  Procalcitonin elevated at 3.55 >4.97.  TSH WNL.  D-dimer borderline elevated 0.78.  Blood culture peripheral venipuncture NTD, blood culture from left chest Port-A-Cath grew gram variable rods. CTH and CXR  negative for acute finding. CT renal study showed no evidence for acute intra-abdominal or pelvic abnormality.  She was started on Doxycycline and Zosyn and Vancomycin given concern of sepsis with metabolic encephalopathy, fever up to 101.8.  Echo is pending.  EKG revealed old LBBB. She was started on heaprin gtt for concern of ACS.   Cardiology is consulted for further input.     Past Medical History:  Diagnosis Date  . Allergy   . Anemia   . Arachnoiditis   . Bipolar disorder ([place])   . Blood transfusion without reported diagnosis   . Cataract    removed  . Chronic back pain   . Chronic pain   . Chronic post-thoracotomy pain   . CKD (chronic kidney disease)   . GERD (gastroesophageal reflux disease)   . Hypogammaglobulinemia ([place])   . Hypotension   .  Hypothyroid   . 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 Lung cancer ([place]) 2011, 2014  . Memory loss   . Migraine   . Opioid abuse ([place])   . Osteoporosis   . Presence of neurostimulator   . Recurrent upper respiratory infection (URI)   . Restless legs   . Sleep apnea   . Tremor   . Urge incontinence   . Urticaria    long time ago, nothing recent    Past Surgical History:   Procedure Laterality Date  . ABDOMINAL HYSTERECTOMY    . CARPAL TUNNEL RELEASE    . CATARACT EXTRACTION Bilateral 2019  . CHOLECYSTECTOMY    . COLONOSCOPY  2015   [facility]  . CT LUNG SCREENING  2018  . DG  BONE DENSITY ([place] HX)  2018  . DIAGNOSTIC MAMMOGRAM  2019  . GASTRIC BYPASS    . LUNG CANCER SURGERY  02/2010  . MULTIPLE TOOTH EXTRACTIONS    . SPINAL CORD STIMULATOR IMPLANT       Home Medications:  Prior to Admission medications   Medication Sig Start Date End Date Taking? Authorizing Provider  acetaminophen (TYLENOL) 500 MG tablet Take 1-2 tablets (500-1,000 mg total) by mouth every 8 (eight) hours as needed (Max dose 3000 mg in 24 hours). 06/11/20   Megan Chandler, NP  Azelastine-Fluticasone 137-50 MCG/ACT SUSP ONE SPRAY EACH NOSTRIL TWICE A DAY AS NEEDED 10/01/20   Ambs, Kathrine Cords, FNP  baclofen (LIORESAL) 10 MG tablet Take 10 mg by mouth every 28 (twenty-eight) days. 06/03/20   [provider]  Buprenorphine HCl-Naloxone HCl 4-1 MG FILM Place 1 strip under the tongue 2 (two) times daily. Patient not taking: No sig reported 10/31/19   [provider]  Calcium Citrate 200 MG TABS Take 2 tablets by mouth daily. 12/02/16   [provider]  cycloSPORINE (RESTASIS) 0.05 % ophthalmic emulsion Place 1 drop into both eyes 2 (two) times daily.    [provider]  dexlansoprazole (DEXILANT) 60 MG capsule Take 1 capsule (60 mg total) by mouth daily. Crack open the capsule and pour into spoon prior to ingestion. PLEASE SCHEDULE AN OFFICE VISIT FOR FURTHER REFILLS. Thank you 01/03/21   Armbruster, Carlota Raspberry, MD  Erenumab-aooe (AIMOVIG, 140 MG DOSE,) 70 MG/ML SOAJ Inject into the skin every 30 (thirty) days.    [provider]  estradiol (ESTRACE) 0.1 MG/GM vaginal cream 3 (three) times a week. 01/12/20   [provider]  ferrous sulfate 325 (65 FE) MG tablet Take 325 mg by mouth 2 (two) times daily with a meal.    [provider]   hydrOXYzine (VISTARIL) 25 MG capsule TAKE ONE CAPSULE BY MOUTH TWICE DAILY AS NEEDED 01/07/21   Thayer Headings, PMHNP  ibuprofen (ADVIL) 200 MG tablet Take 200 mg by mouth every 6 (six) hours as needed.    [provider]  immune globulin, human, (GAMMAGARD S/D LESS IGA) 10 g injection Inject into the vein every 21 ( twenty-one) days.     [provider]  INGREZZA 40 MG capsule Take 40 mg by mouth daily. 01/09/21   [provider]  lamoTRIgine (LAMICTAL) 200 MG tablet TAKE 1 TABLET BY MOUTH DAILY 01/01/21   Thayer Headings, PMHNP  LASTACAFT 0.25 % SOLN Apply 1 drop to eye daily. 01/19/19   [provider]  levothyroxine (SYNTHROID) 75 MCG tablet Take one tablet by mouth once daily 30 minutes before breakfast on empty stomach. 08/21/20   Sherrie Mustache  K, NP  magnesium oxide (MAG-OX) 400 MG tablet Take 400 mg by mouth daily.     [provider]  mirtazapine (REMERON) 30 MG tablet Take 1/2 tablet at bedtime for one week, then increase to 1 tablet at bedtime 01/09/21   Thayer Headings, PMHNP  Multiple Vitamins-Minerals (MEGAVITE FRUITS & VEGGIES PO) Take by mouth.    [provider]  potassium chloride SA (KLOR-CON) 20 MEQ tablet TAKE TWO TABLETS BY MOUTH DAILY (MAY DISSOLVE IN WATER) 06/11/20   Megan Chandler, NP  rOPINIRole (REQUIP) 1 MG tablet TAKE 1 TABLET BY MOUTH AT BEDTIME 09/09/20   Patel, Donika K, DO  saccharomyces boulardii (FLORASTOR) 250 MG capsule Take 1 capsule (250 mg total) by mouth 2 (two) times daily. 01/02/21   Fargo, Amy E, NP  sucralfate (CARAFATE) 1 GM/10ML suspension Take 2 tsp by mouth three times daily as needed. 08/07/20   Ngetich, Dinah C, NP  tiZANidine (ZANAFLEX) 4 MG tablet Take 1 tablet (4 mg total) by mouth every 6 (six) hours as needed for muscle spasms. 11/24/20   Drenda Freeze, MD  torsemide (DEMADEX) 20 MG tablet TAKE 1 TABLET BY MOUTH DAILY 07/23/20   Megan Chandler, NP  triamcinolone cream (KENALOG) 0.1 %  Apply 1 application topically 2 (two) times daily as needed.    [provider]  UNABLE TO FIND Med Name: [name] 1 by mouth daily for UTI prevention    [provider]  ZINC OXIDE, TOPICAL, 10 % CREA Apply 1 application topically in the morning and at bedtime. 12/06/20   Ngetich, Nelda Bucks, NP    Inpatient Medications: Scheduled Meds: . Chlorhexidine Gluconate Cloth  6 each Topical Daily  . levothyroxine  75 mcg Oral Q0600  . pantoprazole  40 mg Oral Daily  . sodium chloride flush  10-40 mL Intracatheter Q12H   Continuous Infusions: . sodium chloride Stopped (01/15/21 0454)  . doxycycline (VIBRAMYCIN) IV 125 mL/hr at 01/15/21 0455  . heparin 600 Units/hr (01/15/21 0455)  . piperacillin-tazobactam (ZOSYN)  IV 3.375 g (01/15/21 0823)  . vancomycin     PRN Meds: oxyCODONE, sodium chloride flush  Allergies:    Allergies  Allergen Reactions  . Tetracycline Nausea Only    Causes ringing in ears, dizziness, migraine, nausea  . Corticosteroids Other (See Comments)    12/02/2016 interview by Melissa Montane: Oral dosing causes migraines/nausea/tinnitus. Prev tolerated IV and intranasal admin w/o difficulty.  . Cefixime Other (See Comments)    Headache   . Ciprofloxacin Other (See Comments)    Headache, dizziness, ringing in the ears  . Doxycycline Other (See Comments)    unknown  . Gabapentin     Throat swells/closes  . Isometheptene-Dichloral-Apap Other (See Comments)    unknown  . Ketorolac     12/02/2016 interview by ZDG: Oral dosing prednisone/corticosteroids causes migraines/nausea/tinnitus. Prev tolerated IV and intranasal admin w/o difficulty.  . Prednisone Other (See Comments)    Migraine, dizzy, ringing in ears-can take it IV 12/02/2016 interview by [name]: Oral prednisone dosing causes migraines/nausea/tinnitus. Prev tolerated IV and intranasal admin w/o difficulty.  . Promethazine Other (See Comments)    causes severe abdominal pain when taken IV; however she can take  PO or IM  . Stadol [Butorphanol]     Cerebral pain  . Erythromycin Base Diarrhea and Itching    Severe diarrhea  . Propoxyphene Nausea Only    Social History:   Social History   Socioeconomic History  . Marital status: Married  Spouse name: Not on file  . Number of children: 2  . Years of education: Not on file  . Highest education level: GED or equivalent  Occupational History  . Occupation: retired  Tobacco Use  . Smoking status: Never Smoker  . Smokeless tobacco: Never Used  Vaping Use  . Vaping Use: Never used  Substance and Sexual Activity  . Alcohol use: Not Currently  . Drug use: Never  . Sexual activity: Not on file  Other Topics Concern  . Not on file  Social History Narrative   Diet: None      Caffeine: coffee, tea, sodas less than or 1 daily.      Married, if yes what year: Yes, 1972      Do you live in a house, apartment, assisted living, condo, trailer, ect: Two story house       Pets: None      Current/Past profession: Teach      Exercise: None         Living Will: No   DNR: No   POA/HPOA: No      Functional Status:   Do you have difficulty bathing or dressing yourself? yes   Do you have difficulty preparing food or eating? yes   Do you have difficulty managing your medications? yes   Do you have difficulty managing your finances?yes   Do you have difficulty affording your medications? no   Social Determinants of Health   Financial Resource Strain: Not on file  Food Insecurity: Not on file  Transportation Needs: Not on file  Physical Activity: Not on file  Stress: Not on file  Social Connections: Not on file  Intimate Partner Violence: Not on file    Family History:    Family History  Problem Relation Age of Onset  . Hypertension Mother   . GER disease Mother   . Dementia Mother   . Osteoporosis Mother   . Arthritis Mother   . Bipolar disorder Mother   . Parkinson's disease Father   . Congestive Heart Failure Father   .  Pneumonia Father   . Arthritis Father   . Dementia Father   . Depression Father   . Asthma Sister   . Depression Sister   . Bipolar disorder Brother   . Asthma Daughter   . Colon cancer Neg Hx   . Esophageal cancer Neg Hx   . Stomach cancer Neg Hx   . Rectal cancer Neg Hx      ROS:  Constitutional: see HPI  Eyes: Denied vision change or loss Ears/Nose/Mouth/Throat: chronic coughing Cardiovascular: see HPI Respiratory: see HPI Gastrointestinal: see HPI  Genital/Urinary: Denied dysuria, hematuria, urinary frequency/urgency Musculoskeletal: Denied muscle ache, joint pain, weakness Skin: Denied rash, wound Neuro: Denied headache, dizziness, syncope Psych: history of depression/anxiety  Endocrine: Denied history of diabetes   Physical Exam/Data:   Vitals:   01/15/21 0400 01/15/21 0500 01/15/21 0600 01/15/21 0800  BP:  (!) 141/54 (!) 195/77 (!) 176/74  Pulse: 77 75 88 88  Resp: 15 16 12 14   Temp:    98.9 F (37.2 C)  TempSrc:    Oral  SpO2: 100% 100% 100% 99%  Weight:      Height:        Intake/Output Summary (Last 24 hours) at 01/15/2021 0924 Last data filed at 01/15/2021 0455 Gross per 24 hour  Intake 6172.34 ml  Output 600 ml  Net 5572.34 ml   Last 3 Weights 01/14/2021 01/11/2021 01/02/2021  Weight (  lbs) 115 lb 123 lb 117 lb 12.8 oz  Weight (kg) 52.164 kg 55.792 kg 53.434 kg  Some encounter information is confidential and restricted. Go to Review Flowsheets activity to see all data.     Body mass index is 20.37 kg/m.   Vitals:  Vitals:   01/15/21 0600 01/15/21 0800  BP: (!) 195/77 (!) 176/74  Pulse: 88 88  Resp: 12 14  Temp:  98.9 F (37.2 C)  SpO2: 100% 99%   General Appearance: In no apparent distress, laying in bed, frail  HEENT: Normocephalic, atraumatic. EOMs intact.  Neck: Supple, trachea midline, no JVDs Cardiovascular: Regular rate and rhythm, normal S1-S2,  no murmur/rub/gallop Respiratory: Resting breathing unlabored, lungs sounds clear to  auscultation bilaterally, no use of accessory muscles. On room air.  No wheezes, rales or rhonchi.   Gastrointestinal: Bowel sounds positive, abdomen soft, mild generalized tenderness, non-distended.  Extremities: Able to move all extremities in bed without difficulty, chronic non-pitting edema of BLE Genitourinary: genital exam not performed Musculoskeletal: Normal muscle bulk and tone, global weakness  Skin: Intact, warm, dry. No rashes or petechiae noted in exposed areas.  Neurologic: Alert, oriented to person, place and time. Fluent speech, no cognitive deficit, no gross focal neuro deficit Psychiatric: Normal affect. Mood is appropriate.  Left chest Port-A Cath in place, dressing clean    EKG:  The EKG was personally reviewed and demonstrates:  Initial EKG on 01/14/21 on 14:57  with sinus tachycardia 107 bpm with associated diffuse inferolateral ST depressions, LBBB. Repeat EKG on 01/14/21 on 22:14 with sinus rhythm with rate of 85 bpm, LBBB, resolved ST depressions.   Telemetry:  Telemetry was personally reviewed and demonstrates:  Sinus rhythm with rate of 80-90s, PVCs occasionally   Relevant CV Studies:  Lexiscan Myoveiw 03/29/19:   The left ventricular ejection fraction is normal (55-65%).  Nuclear stress EF: 67%.  There was no ST segment deviation noted during stress.  The study is normal.  This is a low risk study.   Normal resting and stress perfusion. No ischemia or infarction EF 67%  Laboratory Data:  High Sensitivity Troponin:   Recent Labs  Lab 01/14/21 2158 01/15/21 0030  TROPONINIHS 1,493* 1,089*     Chemistry Recent Labs  Lab 01/14/21 1547 01/14/21 2158 01/15/21 0500  NA 135 138 135  K 2.8* 2.9* 4.0  CL 101 104 106  CO2 23 25 25   GLUCOSE 136* 142* 121*  BUN 23 19 15   CREATININE 0.81 0.83 0.65  CALCIUM 8.9 8.6* 8.1*  GFRNONAA >60 >60 >60  ANIONGAP 11 9 4*    Recent Labs  Lab 01/11/21 1214 01/14/21 1547 01/15/21 0500  PROT 8.0 7.2 6.5   ALBUMIN 3.6 3.4* 2.9*  AST 39 33 33  ALT 36 29 29  ALKPHOS 64 72 56  BILITOT 0.5 0.4 0.4   Hematology Recent Labs  Lab 01/11/21 1214 01/14/21 1547 01/15/21 0500  WBC 4.9 6.0 6.4  RBC 4.66 4.56 4.21  HGB 13.7 13.6 12.9  HCT 42.3 40.4 38.3  MCV 90.8 88.6 91.0  MCH 29.4 29.8 30.6  MCHC 32.4 33.7 33.7  RDW 13.3 13.2 13.5  PLT 165 120* 105*   BNP Recent Labs  Lab 01/11/21 1214  BNP 180.1*    DDimer  Recent Labs  Lab 01/14/21 2204  DDIMER 0.78*     Radiology/Studies:  DG Chest 2 View  Result Date: 01/11/2021 CLINICAL DATA:  75 year old female with shortness of breath. EXAM: CHEST - 2 VIEW COMPARISON:  12/09/2020, 03/29/2019 FINDINGS: The mediastinal contours are within normal limits. No cardiomegaly. Unchanged position of left chest wall subclavian Port-A-Cath with the tip in the right atrium. Similar appearing spinal cord stimulators from recent comparison chest CT. Trace right pleural effusion. No focal consolidations. No pneumothorax. No acute osseous abnormality. Cholecystectomy clips in the right upper quadrant. IMPRESSION: Trace right pleural effusion. Electronically Signed   By: Ruthann Cancer MD   On: 01/11/2021 11:59   CT HEAD WO CONTRAST  Result Date: 01/15/2021 CLINICAL DATA:  Headache, nausea, somnolent EXAM: CT HEAD WITHOUT CONTRAST TECHNIQUE: Contiguous axial images were obtained from the base of the skull through the vertex without intravenous contrast. COMPARISON:  08/20/2020 FINDINGS: Brain: No acute infarct or hemorrhage. Lateral ventricles and midline structures are unremarkable. No acute extra-axial fluid collections. No mass effect. Vascular: No hyperdense vessel or unexpected calcification. Skull: Normal. Negative for fracture or focal lesion. Sinuses/Orbits: Stable postsurgical changes of the bilateral maxillary sinuses and ethmoid air cells. Chronic mucosal thickening within the maxillary, ethmoid, and sphenoid sinuses. Other: None. IMPRESSION: 1. No acute  intracranial process. 2. Chronic paranasal sinus disease as above. Electronically Signed   By: Randa Ngo M.D.   On: 01/15/2021 01:32   US Venous Img Lower Right (DVT Study)  Result Date: 01/11/2021 CLINICAL DATA:  Right lower extremity swelling for 1 month. EXAM: RIGHT LOWER EXTREMITY VENOUS DOPPLER ULTRASOUND TECHNIQUE: Gray-scale sonography with compression, as well as color and duplex ultrasound, were performed to evaluate the deep venous system(s) from the level of the common femoral vein through the popliteal and proximal calf veins. COMPARISON:  None. FINDINGS: VENOUS Normal compressibility of the common femoral, superficial femoral, and popliteal veins, as well as the visualized calf veins. Visualized portions of profunda femoral vein and great saphenous vein unremarkable. No filling defects to suggest DVT on grayscale or color Doppler imaging. Doppler waveforms show normal direction of venous flow, normal respiratory plasticity and response to augmentation. Limited views of the contralateral common femoral vein are unremarkable. OTHER None. Limitations: none IMPRESSION: No evidence of a right lower extremity deep venous thrombosis. Electronically Signed   By: Lajean Manes M.D.   On: 01/11/2021 13:40   DG CHEST PORT 1 VIEW  Result Date: 01/14/2021 CLINICAL DATA:  Shortness of breath EXAM: PORTABLE CHEST 1 VIEW COMPARISON:  Film from earlier in the same day. FINDINGS: A cardiac shadow is stable. Left chest wall port is noted and stable. Postsurgical changes in the right lung are noted. Spinal stimulators are seen. Lungs are clear. IMPRESSION: No acute abnormality noted. Electronically Signed   By: Inez Catalina M.D.   On: 01/14/2021 22:28   DG Chest Port 1 View  Result Date: 01/14/2021 CLINICAL DATA:  Sepsis. EXAM: PORTABLE CHEST 1 VIEW COMPARISON:  Jan 11, 2021. FINDINGS: The heart size and mediastinal contours are within normal limits. Left subclavian Port-A-Cath is unchanged in position. No  pneumothorax or pleural effusion is noted. Left lung is clear. Right infrahilar postoperative changes are again noted with associated scarring. The visualized skeletal structures are unremarkable. IMPRESSION: No definite acute abnormality Electronically Signed   By: Marijo Conception M.D.   On: 01/14/2021 15:39   CT Renal Stone Study  Result Date: 01/14/2021 CLINICAL DATA:  Abdomen pain hematuria EXAM: CT ABDOMEN AND PELVIS WITHOUT CONTRAST TECHNIQUE: Multidetector CT imaging of the abdomen and pelvis was performed following the standard protocol without IV contrast. COMPARISON:  CT 12/09/2020 FINDINGS: Lower chest: Lung bases demonstrate chronic right pleural thickening with calcifications.  Slight rightward deviation of distal esophagus which contains radiopaque material. Hepatobiliary: Status post cholecystectomy. Stable intra and extrahepatic biliary dilatation. No focal hepatic abnormality. Pancreas: Unremarkable. No pancreatic ductal dilatation or surrounding inflammatory changes. Spleen: Normal in size without focal abnormality. Adrenals/Urinary Tract: Thickened adrenal glands without dominant mass. Kidneys show no hydronephrosis or ureteral stone. Distended urinary bladder Stomach/Bowel: Status post gastric bypass. No obstructive changes. No acute bowel wall thickening. Vascular/Lymphatic: Moderate aortic atherosclerosis. No aneurysm. No suspicious nodes Reproductive: Status post hysterectomy. No adnexal masses. Other: Negative for pelvic effusion or free air. Musculoskeletal: No acute osseous abnormality. Left-sided sacral stimulator. Right posterior ascending thoracic stimulator with lead entering the canal at approximate T10 level. IMPRESSION: 1. No CT evidence for acute intra-abdominal or pelvic abnormality. 2. Chronic right pleural thickening with calcifications 3. Status post gastric bypass without adverse features at this time. Electronically Signed   By: Donavan Foil M.D.   On: 01/14/2021 20:05      Assessment and Plan:   Chest pain Elevated troponin - patient endorse one episode of mid-sternum chest pain yesterday when she was having significant chills, SOB, nausea ; chest pain has resolved today  - Lexiscan Myoview from 03/2019 is  low risk and normal - Hs troponin 1493 >1089 - EKG with old LBBB, no acute ischemic changes, initial ST depression likely rate mediated  - Echo is pending , will follow  - suspect demand ischemia, recommend outpatient ischemic evaluation with repeat Lexiscan when acute sepsis/illness resolves   Sepsis - Port-A-Cath blood culture + gram variable rods, Procal elevated, home exposure to COVID + family - CT negative for acute intra-abdominal or pelvic abnormality; CXR clear  - suspect bacteremia from Port-A-Cath infection as the source - management per primary team  Elevated blood pressure  - BP noted to be elevated here, no hx of HTN or on meds in the past - monitor BP, treat for pain and acute discomfort - if remains persistent elevated, may consider add Coreg or Lisinopril   Hypokalemia/Hypomagnesium - resolved with replacement   Hypogammaglobulinemia  - on routine gammagrad infusion at [facility]  Chronic pain syndrome  Depression Restless leg syndrome  GERD Hypothyroidism -Management per primary team    Risk Assessment/Risk Scores:        :[id number]}   HEAR Score (for undifferentiated chest pain):  HEAR Score: 3{     For questions or updates, please contact [facility] Please consult [email or web address] for contact info under    Signed, Margie Billet, NP  01/15/2021 9:24 AM   History and all data above reviewed.  Patient examined.  I agree with the findings as above.    The patient's symptoms were very atypical and not anginal.  She is found to have bacteremia as described.  She was having shaking chills when this occurred.  EKG is not diagnostic she has an old LBBB.  She does have mildly elevated troponins with a flat trend.  She had a  negative cardiac work-up in the past with perfusion imaging.  Has not had any other prior cardiac testing.  She gets around slowly in her house mostly because of unsteady gait and tremor.  She has been able to grocery shop some weeks ago.  With her limited activity she has not been having any chest discomfort, neck or arm discomfort.  She has no shortness of breath, PND or orthopnea.  She has had no weight gain or edema.   The patient exam reveals COR:RRR  ,  Lungs: Clear  ,  Abd: Positive bowel sounds, no rebound no guarding, Ext No edema  .  All available labs, radiology testing, previous records reviewed. Agree with documented assessment and plan. Chest pain:  Non anginal in nature.  Trop is not diagnostic.  EKG likewise not diagnostic.  No further in patient work up is planned.  I would suggest an out patient screening likely with Lexiscan Myoview.  OK to stop heparin and start DVT prophylaxis.     Jeneen Rinks [facility]  10:54 AM  01/15/2021

## 2021-01-15 NOTE — Consult Note (Signed)
NAME:  Megan Brewer, MRN:  [id number], DOB:  Jul 31, 1946, LOS: 0 ADMISSION DATE:  01/14/2021, CONSULTATION DATE:  01/14/2021 REFERRING MD:  Dr. Roel Cluck, [facility], CHIEF COMPLAINT:  Pain   History of Present Illness:  75 yo female presented to ER on 5/31 with chills, headache, nausea, abdominal pain, chest pain, dyspnea, and leg swelling.  She had negative COVID 19 test on 5/29 and again on 5/31.  She was found to have elevated troponin, elevated procalcitonin and fever 101.51F.  Started on antibiotics.  She follows commands, but has difficulty staying awake during evaluation.      Pertinent  Medical History  NSCLC dx 2011 s/p surgery and chemo at [facility], Common variable immune deficiency, Bipolar, Anemia, Chronic pain, s/p neurostimulator, CKD, GERD, Hypothyroidism, Migraine headache, Osteoporosis, Restless legs syndrome, OSA, Tremor, Urticaria, LBBB  Significant Hospital Events: Including procedures, antibiotic start and stop dates in addition to other pertinent events   . Transfer from [place], start vancomycin/zosyn/doxycycline  Interim History / Subjective:   No acute events overnight. Patient reports feeling bad and has aches all over. She reports pain in her neck and back. She denies neck stiffness or headache. Complains of intermittent bladder pain but voiding well. No incontinence.  Objective   Blood pressure (!) 195/77, pulse 88, temperature 98.8 F (37.1 C), temperature source Axillary, resp. rate 12, height 5\' 3"  (1.6 m), weight 52.2 kg, SpO2 100 %.        Intake/Output Summary (Last 24 hours) at 01/15/2021 0802 Last data filed at 01/15/2021 0455 Gross per 24 hour  Intake 6172.34 ml  Output 600 ml  Net 5572.34 ml   Filed Weights   01/14/21 1437  Weight: 52.2 kg    Examination:  General - no acute distress, awake, alert Eyes - sclera anicteric, PERRL ENT - moist mucous membranes Cardiac - regular rate/rhythm, no murmur Chest - clear to auscultation, no  wheeze/rales Abdomen - soft, non tender, + bowel sounds Extremities - trace edema Skin - no rashes Neuro - awake, alert and oriented x 3, moving all extremities  Renal stone study - no acute findings CXR - Rt lung surgical changes, no acute findings ABG - 7.40/44.2/153 K 2.9, Na 138, Creatinine 0.83 Troponin 1493 Procalcitonin 4.97 Plt 120 Doppler Rt leg 01/11/21 - no DVT UA - no nitrites, or leukocytes   Resolved Hospital Problem list     Assessment & Plan:   Sepsis Present on admission. Blood culture showing gram variable rod in 1 bottle. She has port-a-cath in place for IVIG infusions. - f/u cultures - continue vancomycin, doxycycline, and zosyn - Consider ID consult if her condition does not improve given her underlying immunodeficiency status - trend procalcitonin  Acute metabolic encephalopathy likely from sepsis. Hx of Chronic pain, RLS, Bipolar. - monitor mental status, seems improved this morning - CT head without acute pathology - outpt med list includes baclofen, buprenorphine-naloxone, vistaril, lamictal, remeron, requip, zanaflex - Will need to continue baclofen if able to avoid withdrawal issues. Will confirm her dosing regimen.  Elevated troponin. Hx of LBBB. Demand ischemia suspected - f/u Echo - Cardiology Consulted  Common variable immune deficiency. - followed by Dr. Darliss Cheney with immunology at [facility] - gets gammagrad every 3 weeks as outpt, last dose last week  Hx of hypothyroidism. - per primary team  GERD with hx of gastric bypass. - per primary team  Hypokalemia. - f/u BMET  Mild thrombocytopenia. - f/u CBC  Best practice (right click  and "Reselect all SmartList Selections" daily)  Diet:  Oral Pain/Anxiety/Delirium protocol (if indicated): No VAP protocol (if indicated): Not indicated DVT prophylaxis: SCD GI prophylaxis: PPI Glucose control:  SSI No Central venous access:  N/A Arterial line:  N/A Foley:   N/A Mobility:  bed rest  PT consulted: N/A Last date of multidisciplinary goals of care discussion [discussed code status with patient. She is listed as DNI but with CPR. We discussed that this is not a feasible option, she expressed understanding and does not want intubation or CPR, so code status updated to reflect this decision to DNR/DNI] Code Status:  DNR Disposition: ICU/SDU  Labs   CBC: Recent Labs  Lab 01/11/21 1214 01/14/21 1547 01/15/21 0500  WBC 4.9 6.0 6.4  NEUTROABS 3.2 5.6 6.0  HGB 13.7 13.6 12.9  HCT 42.3 40.4 38.3  MCV 90.8 88.6 91.0  PLT 165 120* 105*    Basic Metabolic Panel: Recent Labs  Lab 01/11/21 1214 01/14/21 1547 01/14/21 2158 01/15/21 0030 01/15/21 0500  NA 135 135 138  --  135  K 3.9 2.8* 2.9*  --  4.0  CL 101 101 104  --  106  CO2 25 23 25   --  25  GLUCOSE 102* 136* 142*  --  121*  BUN 23 23 19   --  15  CREATININE 0.90 0.81 0.83  --  0.65  CALCIUM 8.8* 8.9 8.6*  --  8.1*  MG  --  1.4*  --   --  3.0*  PHOS  --   --   --  3.2 2.8   GFR: Estimated Creatinine Clearance: 52.4 mL/min (by C-G formula based on SCr of 0.65 mg/dL). Recent Labs  Lab 01/11/21 1214 01/14/21 1547 01/14/21 1755 01/14/21 1922 01/14/21 2158 01/15/21 0500  PROCALCITON  --   --   --  3.55 4.97  --   WBC 4.9 6.0  --   --   --  6.4  LATICACIDVEN  --  1.3 1.0  --   --   --     Liver Function Tests: Recent Labs  Lab 01/11/21 1214 01/14/21 1547 01/15/21 0500  AST 39 33 33  ALT 36 29 29  ALKPHOS 64 72 56  BILITOT 0.5 0.4 0.4  PROT 8.0 7.2 6.5  ALBUMIN 3.6 3.4* 2.9*   No results for input(s): LIPASE, AMYLASE in the last 168 hours. No results for input(s): AMMONIA in the last 168 hours.  ABG    Component Value Date/Time   PHART 7.403 01/14/2021 2207   PCO2ART 44.2 01/14/2021 2207   PO2ART 153 (H) 01/14/2021 2207   HCO3 27.1 01/14/2021 2207   O2SAT 98.4 01/14/2021 2207     Coagulation Profile: Recent Labs  Lab 01/14/21 1547  INR 0.9    Cardiac  Enzymes: Recent Labs  Lab 01/15/21 0030  CKTOTAL 78    HbA1C: No results found for: HGBA1C  CBG: Recent Labs  Lab 01/14/21 2201  GLUCAP 142*    Signature:  Freda Jackson, MD [facility] Office: [phone]   See Amion for personal pager PCCM on call pager [phone] until 7pm. Please call Elink 7p-7a. [phone]

## 2021-01-15 NOTE — Consult Note (Signed)
[facility] for Infectious Disease       Reason for Consult: fever    Referring Physician: Dr. Tawanna Solo  Active Problems:   OBSTRUCTIVE SLEEP APNEA   Chronic pain syndrome   Hypogammaglobulinemia (HCC)   Left bundle branch block   Restless legs syndrome   Chronic kidney disease   Hypothyroidism   SIRS (systemic inflammatory response syndrome) (HCC)   Elevated troponin   Hypokalemia   Headache   Acute metabolic encephalopathy   . Chlorhexidine Gluconate Cloth  6 each Topical Daily  . heparin injection (subcutaneous)  5,000 Units Subcutaneous Q8H  . levothyroxine  75 mcg Oral Q0600  . mouth rinse  15 mL Mouth Rinse BID  . pantoprazole  40 mg Oral Daily  . sodium chloride flush  10-40 mL Intracatheter Q12H    Recommendations: Stop doxycycline and vancomycin Monitor blood culture Will continue pip/tazo for now  Assessment: She came in with chest pain, lightheadedness, headache similar to a migraine headache and significantly elevated BP associated with somnolence.  She also had reported some chills and had a fever on presentation.  Based on the assessments, she was somnolent but oriented and would just fall asleep.  No confusion reported.  No neck pain.  Not consistent with encephalitis or meningitis.  One blood culture is positive with gram variable rod of unclear signficance.  This often is a contaminate but depends on what grows out.  Most consistent with a viral illness, doubt bacterial but will continue piperacillin/tazobactam for now pending blood cultures.  Possible hypertensive urgency with the headache and chest pain but no confusion.    Antibiotics: Vancomycin, doxycycline, piperacillin/tazo.   HPI: Megan Brewer is a 75 y.o. female with a history of NSCLC with surgery and chemo in 2011, CVID, bipolar, chronic pain with neurostimulator, CKD, hypothyroid, migraines who came in with altered mental status, rigors, fever associated with chest pain,  lightheadedness and myalgias.  She also has had diarrhea for about 4 weeks but is improved since admission.  No known fever at home.  Husband was recently positive for COVID-19.  On admission, she had a normal WBC, normal lactate, BP elevated at 153/113 and 160/121.  Fever to 101.8.  There was concern for sepsis based on tachycardia and fever and started on vancomycin, doxycycline and piperacillin/tazobactam.  She feels better today.  Blood culture with 1/4 bottles with gram variable rod.  No urinary symptoms.   CXR, CT renal stone study, CT head all without any significant issues.  She has a port-a-cath for her monthly Aimovig.  She recently had an increase in her restoril from 15 to 30.    Review of Systems:  Constitutional: positive for fevers and chills and malaise or negative for anorexia Respiratory: positive for cough, negative for hemoptysis or pleurisy/chest pain Gastrointestinal: positive for diarrhea though none since admission, negative for nausea and vomiting Genitourinary: negative for frequency and dysuria Integument/breast: negative for rash Hematologic/lymphatic: negative for lymphadenopathy Musculoskeletal: positive for myalgias; negative for arthralgias and stiff joints Neurological: negative for gait problems All other systems reviewed and are negative    Past Medical History:  Diagnosis Date  . Anemia   . Arachnoiditis   . Bipolar disorder ([place])   . Cataract    removed  . Chronic post-thoracotomy pain   . CKD (chronic kidney disease)   . GERD (gastroesophageal reflux disease)   . Hypogammaglobulinemia ([place])   . Hypotension   . Hypothyroid   . 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  Lung cancer ([place]) 2011, 2014  . Memory loss   . Migraine   . Opioid abuse ([place])   . Osteoporosis   . Presence of neurostimulator   . Restless legs   . Sleep apnea     Social History   Tobacco Use  . Smoking status: Never Smoker  . Smokeless tobacco: Never Used  Vaping  Use  . Vaping Use: Never used  Substance Use Topics  . Alcohol use: Not Currently  . Drug use: Never    Family History  Problem Relation Age of Onset  . Hypertension Mother   . GER disease Mother   . Dementia Mother   . Osteoporosis Mother   . Arthritis Mother   . Bipolar disorder Mother   . Parkinson's disease Father   . Congestive Heart Failure Father   . Pneumonia Father   . Arthritis Father   . Dementia Father   . Depression Father   . Asthma Sister   . Depression Sister   . Bipolar disorder Brother   . Asthma Daughter   . Colon cancer Neg Hx   . Esophageal cancer Neg Hx   . Stomach cancer Neg Hx   . Rectal cancer Neg Hx     Allergies  Allergen Reactions  . Tetracycline Nausea Only    Causes ringing in ears, dizziness, migraine, nausea  . Corticosteroids Other (See Comments)    12/02/2016 interview by Melissa Montane: Oral dosing causes migraines/nausea/tinnitus. Prev tolerated IV and intranasal admin w/o difficulty.  . Cefixime Other (See Comments)    Headache   . Ciprofloxacin Other (See Comments)    Headache, dizziness, ringing in the ears  . Doxycycline Other (See Comments)    unknown  . Gabapentin     Throat swells/closes  . Isometheptene-Dichloral-Apap Other (See Comments)    unknown  . Ketorolac     12/02/2016 interview by NWG: Oral dosing prednisone/corticosteroids causes migraines/nausea/tinnitus. Prev tolerated IV and intranasal admin w/o difficulty.  . Prednisone Other (See Comments)    Migraine, dizzy, ringing in ears-can take it IV 12/02/2016 interview by [name]: Oral prednisone dosing causes migraines/nausea/tinnitus. Prev tolerated IV and intranasal admin w/o difficulty.  . Promethazine Other (See Comments)    causes severe abdominal pain when taken IV; however she can take PO or IM  . Stadol [Butorphanol]     Cerebral pain  . Erythromycin Base Diarrhea and Itching    Severe diarrhea  . Propoxyphene Nausea Only    Physical Exam: Constitutional: in no  apparent distress  Vitals:   01/15/21 1000 01/15/21 1200  BP: 136/64 140/62  Pulse: 85 74  Resp: 15 (!) 23  Temp:  97.9 F (36.6 C)  SpO2: 99% 100%   EYES: anicteric ENMT: no thrush Cardiovascular: Cor RRR Respiratory: clear; GI: Bowel sounds are normal, liver is not enlarged, spleen is not enlarged Musculoskeletal: no pedal edema noted Skin: negatives: no rash Neuro: non-focal  Lab Results  Component Value Date   WBC 6.4 01/15/2021   HGB 12.9 01/15/2021   HCT 38.3 01/15/2021   MCV 91.0 01/15/2021   PLT 105 (L) 01/15/2021    Lab Results  Component Value Date   CREATININE 0.65 01/15/2021   BUN 15 01/15/2021   NA 135 01/15/2021   K 4.0 01/15/2021   CL 106 01/15/2021   CO2 25 01/15/2021    Lab Results  Component Value Date   ALT 29 01/15/2021   AST 33 01/15/2021   ALKPHOS 56 01/15/2021  Microbiology: Recent Results (from the past 240 hour(s))  Blood Culture (routine x 2)     Status: Abnormal (Preliminary result)   Collection Time: 01/14/21  3:46 PM   Specimen: BLOOD  Result Value Ref Range Status   Specimen Description   Final    BLOOD LEFT CHEST PORTA CATH Performed at [facility], [address] [place], [place] [address]    Special Requests   Final    BOTTLES DRAWN AEROBIC AND ANAEROBIC Blood Culture adequate volume Performed at [facility], [address] [place], [place] [address]    Culture  Setup Time (A)  Final    GRAM VARIABLE ROD AEROBIC BOTTLE ONLY CRITICAL RESULT CALLED TO, READ BACK BY AND VERIFIED WITH: PHRMD J LEGGE [id number] AT 1059 AM BY CM    Culture   Final    CULTURE REINCUBATED FOR BETTER GROWTH Performed at [facility], [place] [address] [place], [place] [address]    Report Status PENDING  Incomplete  Blood Culture ID Panel (Reflexed)     Status: None   Collection Time: 01/14/21  3:46 PM  Result Value Ref Range Status   Enterococcus faecalis NOT DETECTED NOT DETECTED Final   Enterococcus Faecium  NOT DETECTED NOT DETECTED Final   Listeria monocytogenes NOT DETECTED NOT DETECTED Final   Staphylococcus species NOT DETECTED NOT DETECTED Final   Staphylococcus aureus (BCID) NOT DETECTED NOT DETECTED Final   Staphylococcus epidermidis NOT DETECTED NOT DETECTED Final   Staphylococcus lugdunensis NOT DETECTED NOT DETECTED Final   Streptococcus species NOT DETECTED NOT DETECTED Final   Streptococcus agalactiae NOT DETECTED NOT DETECTED Final   Streptococcus pneumoniae NOT DETECTED NOT DETECTED Final   Streptococcus pyogenes NOT DETECTED NOT DETECTED Final   A.calcoaceticus-baumannii NOT DETECTED NOT DETECTED Final   Bacteroides fragilis NOT DETECTED NOT DETECTED Final   Enterobacterales NOT DETECTED NOT DETECTED Final   Enterobacter cloacae complex NOT DETECTED NOT DETECTED Final   Escherichia coli NOT DETECTED NOT DETECTED Final   Klebsiella aerogenes NOT DETECTED NOT DETECTED Final   Klebsiella oxytoca NOT DETECTED NOT DETECTED Final   Klebsiella pneumoniae NOT DETECTED NOT DETECTED Final   Proteus species NOT DETECTED NOT DETECTED Final   Salmonella species NOT DETECTED NOT DETECTED Final   Serratia marcescens NOT DETECTED NOT DETECTED Final   Haemophilus influenzae NOT DETECTED NOT DETECTED Final   Neisseria meningitidis NOT DETECTED NOT DETECTED Final   Pseudomonas aeruginosa NOT DETECTED NOT DETECTED Final   Stenotrophomonas maltophilia NOT DETECTED NOT DETECTED Final   Candida albicans NOT DETECTED NOT DETECTED Final   Candida auris NOT DETECTED NOT DETECTED Final   Candida glabrata NOT DETECTED NOT DETECTED Final   Candida krusei NOT DETECTED NOT DETECTED Final   Candida parapsilosis NOT DETECTED NOT DETECTED Final   Candida tropicalis NOT DETECTED NOT DETECTED Final   Cryptococcus neoformans/gattii NOT DETECTED NOT DETECTED Final    Comment: Performed at [facility], [place]. [address] [place], [place] [address]  Resp Panel by RT-PCR (Flu A&B, Covid) Nasopharyngeal Swab      Status: None   Collection Time: 01/14/21  3:47 PM   Specimen: Nasopharyngeal Swab; Nasopharyngeal(NP) swabs in vial transport medium  Result Value Ref Range Status   SARS Coronavirus 2 by RT PCR NEGATIVE NEGATIVE Final    Comment: (NOTE) SARS-CoV-2 target nucleic acids are NOT DETECTED.  The SARS-CoV-2 RNA is generally detectable in upper respiratory specimens during the acute phase of infection.  The lowest concentration of SARS-CoV-2 viral copies this assay can detect is 138 copies/mL. A negative result does not preclude SARS-Cov-2 infection and should not be used as the sole basis for treatment or other patient management decisions. A negative result may occur with  improper specimen collection/handling, submission of specimen other than nasopharyngeal swab, presence of viral mutation(s) within the areas targeted by this assay, and inadequate number of viral copies(<138 copies/mL). A negative result must be combined with clinical observations, patient history, and epidemiological information. The expected result is Negative.  Fact Sheet for Patients:  EntrepreneurPulse.com.au  Fact Sheet for Healthcare Providers:  IncredibleEmployment.be  This test is no t yet approved or cleared by the [place] FDA and  has been authorized for detection and/or diagnosis of SARS-CoV-2 by FDA under an Emergency Use Authorization (EUA). This EUA will remain  in effect (meaning this test can be used) for the duration of the COVID-19 declaration under Section 564(b)(1) of the Act, 21 U.S.C.section 360bbb-3(b)(1), unless the authorization is terminated  or revoked sooner.       Influenza A by PCR NEGATIVE NEGATIVE Final   Influenza B by PCR NEGATIVE NEGATIVE Final    Comment: (NOTE) The Xpert Xpress SARS-CoV-2/FLU/RSV plus assay is intended as an aid in the diagnosis of influenza from Nasopharyngeal swab specimens and should not be used as a sole basis  for treatment. Nasal washings and aspirates are unacceptable for Xpert Xpress SARS-CoV-2/FLU/RSV testing.  Fact Sheet for Patients: EntrepreneurPulse.com.au  Fact Sheet for Healthcare Providers: IncredibleEmployment.be  This test is not yet approved or cleared by the [place] FDA and has been authorized for detection and/or diagnosis of SARS-CoV-2 by FDA under an Emergency Use Authorization (EUA). This EUA will remain in effect (meaning this test can be used) for the duration of the COVID-19 declaration under Section 564(b)(1) of the Act, 21 U.S.C. section 360bbb-3(b)(1), unless the authorization is terminated or revoked.  Performed at [facility], [address] [place], [place] [address]   Blood Culture (routine x 2)     Status: Abnormal (Preliminary result)   Collection Time: 01/14/21  3:51 PM   Specimen: BLOOD LEFT ARM  Result Value Ref Range Status   Specimen Description   Final    BLOOD LEFT ARM Performed at [facility], [address] [place], [place] [address]    Special Requests   Final    BOTTLES DRAWN AEROBIC AND ANAEROBIC Blood Culture adequate volume Performed at [facility], [address] [place], [place] [address]    Culture  Setup Time (A)  Final    GRAM VARIABLE ROD AEROBIC BOTTLE ONLY CRITICAL VALUE NOTED.  VALUE IS CONSISTENT WITH PREVIOUSLY REPORTED AND CALLED VALUE.    Culture   Final    CULTURE REINCUBATED FOR BETTER GROWTH Performed at [facility], [place] [address] [place], [place] [address]    Report Status PENDING  Incomplete  Respiratory (~20 pathogens) panel by PCR     Status: None   Collection Time: 01/14/21  7:22 PM   Specimen: Nasopharyngeal Swab; Respiratory  Result Value Ref Range Status   Adenovirus NOT DETECTED NOT DETECTED Final   Coronavirus 229E NOT DETECTED NOT DETECTED Final    Comment: (NOTE) The Coronavirus on the Respiratory Panel, DOES NOT  test for the novel  Coronavirus (2019 nCoV)    Coronavirus HKU1 NOT DETECTED NOT DETECTED Final   Coronavirus NL63 NOT DETECTED NOT DETECTED Final  Coronavirus OC43 NOT DETECTED NOT DETECTED Final   Metapneumovirus NOT DETECTED NOT DETECTED Final   Rhinovirus / Enterovirus NOT DETECTED NOT DETECTED Final   Influenza A NOT DETECTED NOT DETECTED Final   Influenza B NOT DETECTED NOT DETECTED Final   Parainfluenza Virus 1 NOT DETECTED NOT DETECTED Final   Parainfluenza Virus 2 NOT DETECTED NOT DETECTED Final   Parainfluenza Virus 3 NOT DETECTED NOT DETECTED Final   Parainfluenza Virus 4 NOT DETECTED NOT DETECTED Final   Respiratory Syncytial Virus NOT DETECTED NOT DETECTED Final   Bordetella pertussis NOT DETECTED NOT DETECTED Final   Bordetella Parapertussis NOT DETECTED NOT DETECTED Final   Chlamydophila pneumoniae NOT DETECTED NOT DETECTED Final   Mycoplasma pneumoniae NOT DETECTED NOT DETECTED Final    Comment: Performed at [facility], [place] [address], [place], [place] [address]  MRSA PCR Screening     Status: None   Collection Time: 01/14/21 11:15 PM   Specimen: Nasopharyngeal  Result Value Ref Range Status   MRSA by PCR NEGATIVE NEGATIVE Final    Comment:        The GeneXpert MRSA Assay (FDA approved for NASAL specimens only), is one component of a comprehensive MRSA colonization surveillance program. It is not intended to diagnose MRSA infection nor to guide or monitor treatment for MRSA infections. Performed at [facility], [place] [address], [place], [place] [address]     Koury Roddy W Cing , MD [facility] [email or web address] 01/15/2021, 2:12 PM

## 2021-01-15 NOTE — Progress Notes (Signed)
Pharmacy Antibiotic Note  Megan Brewer is a 75 y.o. female admitted on 01/14/2021 with Cdiff.  Pharmacy has been consulted for PO vancomycin dosing.  Plan: PO vancomycin capsules 125 mg PO QID x 10 days.  Consider discontinuing protonix if clinically appropriate  Height: 5\' 3"  (160 cm) Weight: 52.2 kg (115 lb) IBW/kg (Calculated) : 52.4  Temp (24hrs), Avg:98.4 F (36.9 C), Min:97.6 F (36.4 C), Max:98.9 F (37.2 C)  Recent Labs  Lab 01/11/21 1214 01/14/21 1547 01/14/21 1755 01/14/21 2158 01/15/21 0500  WBC 4.9 6.0  --   --  6.4  CREATININE 0.90 0.81  --  0.83 0.65  LATICACIDVEN  --  1.3 1.0  --   --     Estimated Creatinine Clearance: 52.4 mL/min (by C-G formula based on SCr of 0.65 mg/dL).    Allergies  Allergen Reactions  . Tetracycline Nausea Only    Causes ringing in ears, dizziness, migraine, nausea  . Corticosteroids Other (See Comments)    12/02/2016 interview by Melissa Montane: Oral dosing causes migraines/nausea/tinnitus. Prev tolerated IV and intranasal admin w/o difficulty.  . Cefixime Other (See Comments)    Headache   . Ciprofloxacin Other (See Comments)    Headache, dizziness, ringing in the ears  . Doxycycline Other (See Comments)    unknown  . Gabapentin     Throat swells/closes  . Isometheptene-Dichloral-Apap Other (See Comments)    unknown  . Ketorolac     12/02/2016 interview by LTJ: Oral dosing prednisone/corticosteroids causes migraines/nausea/tinnitus. Prev tolerated IV and intranasal admin w/o difficulty.  . Prednisone Other (See Comments)    Migraine, dizzy, ringing in ears-can take it IV 12/02/2016 interview by [name]: Oral prednisone dosing causes migraines/nausea/tinnitus. Prev tolerated IV and intranasal admin w/o difficulty.  . Promethazine Other (See Comments)    causes severe abdominal pain when taken IV; however she can take PO or IM  . Stadol [Butorphanol]     Cerebral pain  . Erythromycin Base Diarrhea and Itching    Severe diarrhea   . Propoxyphene Nausea Only    Thank you for allowing pharmacy to be a part of this patient's care.  Eudelia Bunch, Pharm.D 01/15/2021 5:22 PM

## 2021-01-15 NOTE — Evaluation (Signed)
Physical Therapy Evaluation Patient Details Name: Megan Brewer MRN: [id number] DOB: [birth year] Today's Date: 01/15/2021   History of Present Illness  Patient is a 75 year old female generalized fatigue chills tachycardia febrile. PMH includes lung cancer sp ressection, immunoglobulin deficiency (PTA Gammagard infusions), recurrent UTI  Sp Gastric bipass 8-10 y ago  Clinical Impression  The patient is weak but is mobilizing with min assist. Patient independent PTA, resides with spouse. Patient  Should progress to return home. Pt admitted with above diagnosis. Pt currently with functional limitations due to the deficits listed below (see PT Problem List). Pt will benefit from skilled PT to increase their independence and safety with mobility to allow discharge to the venue listed below.    Spo2 95% on RA when mobilizing, HR 90's.     Follow Up Recommendations Home health PT    Equipment Recommendations  None recommended by PT    Recommendations for Other Services       Precautions / Restrictions Precautions Precautions: Fall Restrictions Weight Bearing Restrictions: No      Mobility  Bed Mobility Overal bed mobility: Modified Independent                  Transfers Overall transfer level: Needs assistance Equipment used: 1 person hand held assist Transfers: Sit to/from Stand;Stand Pivot Transfers Sit to Stand: Min guard Stand pivot transfers: Min assist       General transfer comment: steady assist for standing and transfer to [facility] then to recliner that was pulled up behind her.  Ambulation/Gait             General Gait Details: TBA  Stairs            Wheelchair Mobility    Modified Rankin (Stroke Patients Only)       Balance Overall balance assessment: Needs assistance Sitting-balance support: Feet supported Sitting balance-Leahy Scale: Good     Standing balance support: No upper extremity supported Standing balance-Leahy Scale:  Fair Standing balance comment: static standing fair, poor for dynamic needing min A for safety/steadying                             Pertinent Vitals/Pain Pain Assessment: Faces Faces Pain Scale: No hurt    Home Living Family/patient expects to be discharged to:: Private residence Living Arrangements: Spouse/significant other Available Help at Discharge: Family Type of Home: House Home Access: Stairs to enter   [employer] of Steps: 6 front, 3 from garage Home Layout: Two level;Able to live on main level with bedroom/bathroom Home Equipment: Kasandra Knudsen - single point;Walker - 2 wheels      Prior Function Level of Independence: Independent               Hand Dominance   Dominant Hand: Right    Extremity/Trunk Assessment   Upper Extremity Assessment Upper Extremity Assessment: Generalized weakness    Lower Extremity Assessment Lower Extremity Assessment: Generalized weakness    Cervical / Trunk Assessment Cervical / Trunk Assessment: Normal  Communication   Communication: No difficulties  Cognition Arousal/Alertness: Awake/alert Behavior During Therapy: WFL for tasks assessed/performed Overall Cognitive Status: Within Functional Limits for tasks assessed                                 General Comments: initially lethargic however with mobility becomes alert, oriented, very pleasant  General Comments General comments (skin integrity, edema, etc.): maintained 97% on room air during treatment    Exercises     Assessment/Plan    PT Assessment Patient needs continued PT services  PT Problem List Decreased strength;Decreased mobility;Decreased activity tolerance;Decreased knowledge of use of DME       PT Treatment Interventions DME instruction;Therapeutic activities;Gait training;Therapeutic exercise;Patient/family education;Functional mobility training    PT Goals (Current goals can be found in the Care Plan section)   Acute Rehab PT Goals Patient Stated Goal: home to husband PT Goal Formulation: With patient Time For Goal Achievement: 01/29/21 Potential to Achieve Goals: Good    Frequency Min 3X/week   Barriers to discharge        Co-evaluation               AM-PAC PT "6 Clicks" Mobility  Outcome Measure Help needed turning from your back to your side while in a flat bed without using bedrails?: None Help needed moving from lying on your back to sitting on the side of a flat bed without using bedrails?: None Help needed moving to and from a bed to a chair (including a wheelchair)?: A Little Help needed standing up from a chair using your arms (e.g., wheelchair or bedside chair)?: A Little Help needed to walk in hospital room?: A Little Help needed climbing 3-5 steps with a railing? : A Lot 6 Click Score: 19    End of Session   Activity Tolerance: Patient limited by fatigue Patient left: in chair;with call bell/phone within reach;with chair alarm set Nurse Communication: Mobility status PT Visit Diagnosis: Difficulty in walking, not elsewhere classified (R26.2)    Time: 2025-4270 PT Time Calculation (min) (ACUTE ONLY): 18 min   Charges:   PT Evaluation $PT Eval Low Complexity: Megan Brewer Pager [phone] Office [phone]   Megan Brewer 01/15/2021, 1:18 PM

## 2021-01-15 NOTE — Progress Notes (Signed)
ANTICOAGULATION CONSULT NOTE - Initial Consult  Pharmacy Consult for heparin Indication: chest pain/ACS  Allergies  Allergen Reactions  . Tetracycline Nausea Only    Causes ringing in ears, dizziness, migraine, nausea  . Corticosteroids Other (See Comments)    12/02/2016 interview by Melissa Montane: Oral dosing causes migraines/nausea/tinnitus. Prev tolerated IV and intranasal admin w/o difficulty.  . Cefixime Other (See Comments)    Headache   . Ciprofloxacin Other (See Comments)    Headache, dizziness, ringing in the ears  . Doxycycline Other (See Comments)    unknown  . Gabapentin     Throat swells/closes  . Isometheptene-Dichloral-Apap Other (See Comments)    unknown  . Ketorolac     12/02/2016 interview by VHQ: Oral dosing prednisone/corticosteroids causes migraines/nausea/tinnitus. Prev tolerated IV and intranasal admin w/o difficulty.  . Prednisone Other (See Comments)    Migraine, dizzy, ringing in ears-can take it IV 12/02/2016 interview by [name]: Oral prednisone dosing causes migraines/nausea/tinnitus. Prev tolerated IV and intranasal admin w/o difficulty.  . Promethazine Other (See Comments)    causes severe abdominal pain when taken IV; however she can take PO or IM  . Stadol [Butorphanol]     Cerebral pain  . Erythromycin Base Diarrhea and Itching    Severe diarrhea  . Propoxyphene Nausea Only    Patient Measurements: Height: 5\' 3"  (160 cm) Weight: 52.2 kg (115 lb) IBW/kg (Calculated) : 52.4 Heparin Dosing Weight: 52.2kg  Vital Signs: Temp: 98.6 F (37 C) (05/31 2121) Temp Source: Oral (05/31 2121) BP: 163/63 (05/31 2307) Pulse Rate: 82 (05/31 2307)  Labs: Recent Labs    01/14/21 1547 01/14/21 2158 01/15/21 0030  HGB 13.6  --   --   HCT 40.4  --   --   PLT 120*  --   --   APTT 80*  --   --   LABPROT 12.4  --   --   INR 0.9  --   --   CREATININE 0.81 0.83  --   TROPONINIHS  --  1,493* 1,089*    Estimated Creatinine Clearance: 50.5 mL/min (by C-G formula  based on SCr of 0.83 mg/dL).   Medical History: Past Medical History:  Diagnosis Date  . Allergy   . Anemia   . Arachnoiditis   . Bipolar disorder ([place])   . Blood transfusion without reported diagnosis   . Cataract    removed  . Chronic back pain   . Chronic pain   . Chronic post-thoracotomy pain   . CKD (chronic kidney disease)   . GERD (gastroesophageal reflux disease)   . Hypogammaglobulinemia ([place])   . Hypotension   . Hypothyroid   . 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 Lung cancer ([place]) 2011, 2014  . Memory loss   . Migraine   . Opioid abuse ([place])   . Osteoporosis   . Presence of neurostimulator   . Recurrent upper respiratory infection (URI)   . Restless legs   . Sleep apnea   . Tremor   . Urge incontinence   . Urticaria    long time ago, nothing recent    Assessment: 75 yo female presented to ER on 5/31 with chills, headache, nausea, abdominal pain, chest pain, dyspnea, and leg swelling.   She was found to have elevated troponin, elevated procalcitonin .  Pharmacy consulted to dose heparin drip for ACS/STEMI.  No prior AC noted  APTT 80, PT 12.4, Hgb 13.6, Plts 120,   Goal of Therapy:  Heparin level 0.3-0.7  units/ml Monitor platelets by anticoagulation protocol   Plan:  Heparin bolus 3000 units x 1 Start Heparin drip at 600 units/hr Heparin level in 8 hours Daily CBC  Dolly Rias RPh 01/15/2021, 1:52 AM

## 2021-01-15 NOTE — Progress Notes (Signed)
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  COVID screen test was negative.  On presentation patient was febrile, tachycardic.  Lab work showed hypokalemia, elevated troponin.  Patient was confused.  Patient was admitted for the management of severe sepsis.  Assessment & Plan:   Active Problems:   OBSTRUCTIVE SLEEP APNEA   Chronic pain syndrome   Hypogammaglobulinemia (HCC)   Left bundle branch block   Restless legs syndrome   Chronic kidney disease   Hypothyroidism   SIRS (systemic inflammatory response syndrome) (HCC)   Elevated troponin   Hypokalemia   Headache   Acute metabolic encephalopathy   Severe sepsis: Presented with fever with evidence of end organ damage, confusion, troponin elevation.  Elevated procalcitonin .started on IV antibiotics, IV fluids.  Follow-up cultures.  Patient has history of immunoglobulin deficiency. Chest x-ray, CT renal did not show any acute findings.  No clear source of sepsis at present.  Venous Doppler negative for DVT.  She has a port on the left chest.  Blood culture in the aerobic bottle now showing gram variable rod.  We have requested for infectious disease consultation  Acute metabolic encephalopathy: Most likely secondary to sepsis versus polypharmacy.  Monitor mental status.  She was on multiple psychoactive medications at home which includes baclofen, Suboxone, Vistaril, Lamictal, Remeron, Requip, Zanaflex. these are on hold.  CT head did not show any acute intracranial  abnormality.  Headache/back pain: Has history of migraines, has history of chronic back pain.  Continue supportive care.   Minimize sedatives.  Ordered migraine cocktail today.  No signs of CNS infection  Elevated troponin: Most likely secondary to supply demand ischemia from type II MI.  Echocardiogram showed ejection fraction of 55 to 60%, indeterminate left ventricular diastolic parameters.  Cardiology consulted.  Denies any chest pain.  Started on heparin by the admitting physician which will be discontinued but will wait for cardiology evaluation.Marland Kitchen  Has history of left bundle branch block.  EKG did not show signs of any ischemic changes  History of hypogammaglobinemia: History of common variable immune deficiency.  Followed by Dr. London Pepper with hematology at [facility] every 3 weeks as an outpatient.  GERD/history of gastric bypass: Continue Protonix  Thrombocytopenia: Mild.  Most likely secondary to sepsis.  Continue to monitor  Hypothyroidism: Continue Synthyroid  Hypokalemia: Supplemented  Generalized weakness/debility/deconditioning: We will request a PT/OT evaluation.           DVT prophylaxis:Heparin IV Code Status: Partial Family Communication:: Discussed with the husband on phone on 01/15/21 Status is: Observation  The patient remains OBS appropriate and will d/c before 2 midnights.  Dispo: The patient is from: Home              Anticipated d/c is to: Home              Patient currently is not medically stable to d/c.   Difficult to place patient No     Consultants: PCCM, cardiology  Procedures: None  Antimicrobials:  Anti-infectives (From admission, onward)   Start     Dose/Rate Route Frequency Ordered Stop   01/15/21 1700  vancomycin (VANCOREADY) IVPB 750 mg/150 mL  750 mg 150 mL/hr over 60 Minutes Intravenous Every 24 hours 01/14/21 1931     01/15/21 0600  piperacillin-tazobactam (ZOSYN) IVPB 3.375 g        3.375 g 12.5 mL/hr over 240 Minutes Intravenous Every 8 hours 01/14/21 1931     01/15/21 0000  doxycycline (VIBRAMYCIN) 100 mg in  sodium chloride 0.9 % 250 mL IVPB        100 mg 125 mL/hr over 120 Minutes Intravenous Every 12 hours 01/14/21 2310     01/14/21 1930  piperacillin-tazobactam (ZOSYN) IVPB 3.375 g        3.375 g 100 mL/hr over 30 Minutes Intravenous  Once 01/14/21 1918 01/14/21 2005   01/14/21 1915  aztreonam (AZACTAM) 2 g in sodium chloride 0.9 % 100 mL IVPB  Status:  Discontinued        2 g 200 mL/hr over 30 Minutes Intravenous  Once 01/14/21 1901 01/14/21 1918   01/14/21 1915  metroNIDAZOLE (FLAGYL) IVPB 500 mg  Status:  Discontinued        500 mg 100 mL/hr over 60 Minutes Intravenous  Once 01/14/21 1901 01/14/21 1917   01/14/21 1915  vancomycin (VANCOCIN) IVPB 1000 mg/200 mL premix        1,000 mg 200 mL/hr over 60 Minutes Intravenous  Once 01/14/21 1901 01/14/21 2053      Subjective:  Patient seen and examined the bedside this morning.  Complaining of severe headache and back pain.  She was little hypertensive during my evaluation.  Afebrile.  Denies any shortness of breath, cough or dysuria.  Very weak and deconditioned   Objective: Vitals:   01/15/21 0331 01/15/21 0400 01/15/21 0500 01/15/21 0600  BP:   (!) 141/54 (!) 195/77  Pulse:  77 75 88  Resp:  15 16 12   Temp: 98.8 F (37.1 C)     TempSrc: Axillary     SpO2:  100% 100% 100%  Weight:      Height:        Intake/Output Summary (Last 24 hours) at 01/15/2021 0738 Last data filed at 01/15/2021 0455 Gross per 24 hour  Intake 6172.34 ml  Output 600 ml  Net 5572.34 ml   Filed Weights   01/14/21 1437  Weight: 52.2 kg    Examination:  General exam: Chronically ill looking, weak HEENT:PERRL,Oral mucosa moist, Ear/Nose normal on gross exam Respiratory system: Bilateral equal air entry, normal vesicular breath sounds, no wheezes or crackles  Cardiovascular system: S1 & S2 heard, RRR. No JVD, murmurs, rubs, gallops or clicks. No pedal edema.  Chemo-Port on the left chest Gastrointestinal system: Abdomen is nondistended, soft and  nontender. No organomegaly or masses felt. Normal bowel sounds heard. Central nervous system: Alert and oriented. No focal neurological deficits. Extremities: No edema, no clubbing ,no cyanosis Skin: No rashes, lesions or ulcers,no icterus ,no pallor   Data Reviewed: I have personally reviewed following labs and imaging studies  CBC: Recent Labs  Lab 01/11/21 1214 01/14/21 1547 01/15/21 0500  WBC 4.9 6.0 6.4  NEUTROABS 3.2 5.6 6.0  HGB 13.7 13.6 12.9  HCT 42.3 40.4 38.3  MCV 90.8 88.6 91.0  PLT 165 120* 962*   Basic Metabolic Panel: Recent Labs  Lab 01/11/21 1214 01/14/21 1547 01/14/21 2158 01/15/21 0030 01/15/21 0500  NA 135 135 138  --  135  K 3.9 2.8* 2.9*  --  4.0  CL 101 101 104  --  106  CO2 25 23 25   --  25  GLUCOSE  102* 136* 142*  --  121*  BUN 23 23 19   --  15  CREATININE 0.90 0.81 0.83  --  0.65  CALCIUM 8.8* 8.9 8.6*  --  8.1*  MG  --  1.4*  --   --  3.0*  PHOS  --   --   --  3.2 2.8   GFR: Estimated Creatinine Clearance: 52.4 mL/min (by C-G formula based on SCr of 0.65 mg/dL). Liver Function Tests: Recent Labs  Lab 01/11/21 1214 01/14/21 1547 01/15/21 0500  AST 39 33 33  ALT 36 29 29  ALKPHOS 64 72 56  BILITOT 0.5 0.4 0.4  PROT 8.0 7.2 6.5  ALBUMIN 3.6 3.4* 2.9*   No results for input(s): LIPASE, AMYLASE in the last 168 hours. No results for input(s): AMMONIA in the last 168 hours. Coagulation Profile: Recent Labs  Lab 01/14/21 1547  INR 0.9   Cardiac Enzymes: Recent Labs  Lab 01/15/21 0030  CKTOTAL 78   BNP (last 3 results) No results for input(s): PROBNP in the last 8760 hours. HbA1C: No results for input(s): HGBA1C in the last 72 hours. CBG: Recent Labs  Lab 01/14/21 2201  GLUCAP 142*   Lipid Profile: No results for input(s): CHOL, HDL, LDLCALC, TRIG, CHOLHDL, LDLDIRECT in the last 72 hours. Thyroid Function Tests: Recent Labs    01/15/21 0500  TSH 2.990   Anemia Panel: No results for input(s): VITAMINB12, FOLATE,  FERRITIN, TIBC, IRON, RETICCTPCT in the last 72 hours. Sepsis Labs: Recent Labs  Lab 01/14/21 1547 01/14/21 1755 01/14/21 1922 01/14/21 2158  PROCALCITON  --   --  3.55 4.97  LATICACIDVEN 1.3 1.0  --   --     Recent Results (from the past 240 hour(s))  Blood Culture (routine x 2)     Status: Abnormal (Preliminary result)   Collection Time: 01/14/21  3:46 PM   Specimen: BLOOD  Result Value Ref Range Status   Specimen Description   Final    BLOOD LEFT CHEST PORTA CATH Performed at [facility], [address] [place], [place] [address]    Special Requests   Final    BOTTLES DRAWN AEROBIC AND ANAEROBIC Blood Culture adequate volume Performed at [facility], [address] [place], [place] [address]    Culture  Setup Time (A)  Final    GRAM VARIABLE ROD AEROBIC BOTTLE ONLY Organism ID to follow    Culture   Final    CULTURE REINCUBATED FOR BETTER GROWTH Performed at [facility], [place] [address] [place], [place] [address]    Report Status PENDING  Incomplete  Resp Panel by RT-PCR (Flu A&B, Covid) Nasopharyngeal Swab     Status: None   Collection Time: 01/14/21  3:47 PM   Specimen: Nasopharyngeal Swab; Nasopharyngeal(NP) swabs in vial transport medium  Result Value Ref Range Status   SARS Coronavirus 2 by RT PCR NEGATIVE NEGATIVE Final    Comment: (NOTE) SARS-CoV-2 target nucleic acids are NOT DETECTED.  The SARS-CoV-2 RNA is generally detectable in upper respiratory specimens during the acute phase of infection. The lowest concentration of SARS-CoV-2 viral copies this assay can detect is 138 copies/mL. A negative result does not preclude SARS-Cov-2 infection and should not be used as the sole basis for treatment or other patient management decisions. A negative result may occur with  improper specimen collection/handling, submission of specimen other than nasopharyngeal swab, presence of viral mutation(s) within the areas targeted by this  assay, and inadequate number of viral copies(<138 copies/mL). A negative result must be combined with clinical observations, patient history, and epidemiological information. The expected result is Negative.  Fact Sheet for Patients:  EntrepreneurPulse.com.au  Fact Sheet for Healthcare Providers:  IncredibleEmployment.be  This test is no t yet approved or cleared by the [place] FDA and  has been authorized for detection and/or diagnosis of SARS-CoV-2 by FDA under an Emergency Use Authorization (EUA). This EUA will remain  in effect (meaning this test can be used) for the duration of the COVID-19 declaration under Section 564(b)(1) of the Act, 21 U.S.C.section 360bbb-3(b)(1), unless the authorization is terminated  or revoked sooner.       Influenza A by PCR NEGATIVE NEGATIVE Final   Influenza B by PCR NEGATIVE NEGATIVE Final    Comment: (NOTE) The Xpert Xpress SARS-CoV-2/FLU/RSV plus assay is intended as an aid in the diagnosis of influenza from Nasopharyngeal swab specimens and should not be used as a sole basis for treatment. Nasal washings and aspirates are unacceptable for Xpert Xpress SARS-CoV-2/FLU/RSV testing.  Fact Sheet for Patients: EntrepreneurPulse.com.au  Fact Sheet for Healthcare Providers: IncredibleEmployment.be  This test is not yet approved or cleared by the [place] FDA and has been authorized for detection and/or diagnosis of SARS-CoV-2 by FDA under an Emergency Use Authorization (EUA). This EUA will remain in effect (meaning this test can be used) for the duration of the COVID-19 declaration under Section 564(b)(1) of the Act, 21 U.S.C. section 360bbb-3(b)(1), unless the authorization is terminated or revoked.  Performed at [facility], [address] [place], [place] [address]   Blood Culture (routine x 2)     Status: None (Preliminary result)    Collection Time: 01/14/21  3:51 PM   Specimen: BLOOD LEFT ARM  Result Value Ref Range Status   Specimen Description   Final    BLOOD LEFT ARM Performed at [facility], [address] [place], [place] [address]    Special Requests   Final    BOTTLES DRAWN AEROBIC AND ANAEROBIC Blood Culture adequate volume Performed at [facility], [address] [place], [place] [address]    Culture   Final    NO GROWTH < 12 HOURS Performed at [facility], [place] [address], [place] [place], [place] [address]    Report Status PENDING  Incomplete  Respiratory (~20 pathogens) panel by PCR     Status: None   Collection Time: 01/14/21  7:22 PM   Specimen: Nasopharyngeal Swab; Respiratory  Result Value Ref Range Status   Adenovirus NOT DETECTED NOT DETECTED Final   Coronavirus 229E NOT DETECTED NOT DETECTED Final    Comment: (NOTE) The Coronavirus on the Respiratory Panel, DOES NOT test for the novel  Coronavirus (2019 nCoV)    Coronavirus HKU1 NOT DETECTED NOT DETECTED Final   Coronavirus NL63 NOT DETECTED NOT DETECTED Final   Coronavirus OC43 NOT DETECTED NOT DETECTED Final   Metapneumovirus NOT DETECTED NOT DETECTED Final   Rhinovirus / Enterovirus NOT DETECTED NOT DETECTED Final   Influenza A NOT DETECTED NOT DETECTED Final   Influenza B NOT DETECTED NOT DETECTED Final   Parainfluenza Virus 1 NOT DETECTED NOT DETECTED Final   Parainfluenza Virus 2 NOT DETECTED NOT DETECTED Final   Parainfluenza Virus 3 NOT DETECTED NOT DETECTED Final   Parainfluenza Virus 4 NOT DETECTED NOT DETECTED Final   Respiratory Syncytial Virus NOT DETECTED NOT DETECTED Final   Bordetella  pertussis NOT DETECTED NOT DETECTED Final   Bordetella Parapertussis NOT DETECTED NOT DETECTED Final   Chlamydophila pneumoniae NOT DETECTED NOT DETECTED Final   Mycoplasma pneumoniae NOT DETECTED NOT DETECTED Final    Comment: Performed at [facility], [place] [address], [place], [place] [address]  MRSA PCR  Screening     Status: None   Collection Time: 01/14/21 11:15 PM   Specimen: Nasopharyngeal  Result Value Ref Range Status   MRSA by PCR NEGATIVE NEGATIVE Final    Comment:        The GeneXpert MRSA Assay (FDA approved for NASAL specimens only), is one component of a comprehensive MRSA colonization surveillance program. It is not intended to diagnose MRSA infection nor to guide or monitor treatment for MRSA infections. Performed at [facility], [place] [address], [place], [place] [address]          Radiology Studies: CT HEAD WO CONTRAST  Result Date: 01/15/2021 CLINICAL DATA:  Headache, nausea, somnolent EXAM: CT HEAD WITHOUT CONTRAST TECHNIQUE: Contiguous axial images were obtained from the base of the skull through the vertex without intravenous contrast. COMPARISON:  08/20/2020 FINDINGS: Brain: No acute infarct or hemorrhage. Lateral ventricles and midline structures are unremarkable. No acute extra-axial fluid collections. No mass effect. Vascular: No hyperdense vessel or unexpected calcification. Skull: Normal. Negative for fracture or focal lesion. Sinuses/Orbits: Stable postsurgical changes of the bilateral maxillary sinuses and ethmoid air cells. Chronic mucosal thickening within the maxillary, ethmoid, and sphenoid sinuses. Other: None. IMPRESSION: 1. No acute intracranial process. 2. Chronic paranasal sinus disease as above. Electronically Signed   By: Randa Ngo M.D.   On: 01/15/2021 01:32   DG CHEST PORT 1 VIEW  Result Date: 01/14/2021 CLINICAL DATA:  Shortness of breath EXAM: PORTABLE CHEST 1 VIEW COMPARISON:  Film from earlier in the same day. FINDINGS: A cardiac shadow is stable. Left chest wall port is noted and stable. Postsurgical changes in the right lung are noted. Spinal stimulators are seen. Lungs are clear. IMPRESSION: No acute abnormality noted. Electronically Signed   By: Inez Catalina M.D.   On: 01/14/2021 22:28   DG Chest Port 1  View  Result Date: 01/14/2021 CLINICAL DATA:  Sepsis. EXAM: PORTABLE CHEST 1 VIEW COMPARISON:  Jan 11, 2021. FINDINGS: The heart size and mediastinal contours are within normal limits. Left subclavian Port-A-Cath is unchanged in position. No pneumothorax or pleural effusion is noted. Left lung is clear. Right infrahilar postoperative changes are again noted with associated scarring. The visualized skeletal structures are unremarkable. IMPRESSION: No definite acute abnormality Electronically Signed   By: Marijo Conception M.D.   On: 01/14/2021 15:39   CT Renal Stone Study  Result Date: 01/14/2021 CLINICAL DATA:  Abdomen pain hematuria EXAM: CT ABDOMEN AND PELVIS WITHOUT CONTRAST TECHNIQUE: Multidetector CT imaging of the abdomen and pelvis was performed following the standard protocol without IV contrast. COMPARISON:  CT 12/09/2020 FINDINGS: Lower chest: Lung bases demonstrate chronic right pleural thickening with calcifications. Slight rightward deviation of distal esophagus which contains radiopaque material. Hepatobiliary: Status post cholecystectomy. Stable intra and extrahepatic biliary dilatation. No focal hepatic abnormality. Pancreas: Unremarkable. No pancreatic ductal dilatation or surrounding inflammatory changes. Spleen: Normal in size without focal abnormality. Adrenals/Urinary Tract: Thickened adrenal glands without dominant mass. Kidneys show no hydronephrosis or ureteral stone. Distended urinary bladder Stomach/Bowel: Status post gastric bypass. No obstructive changes. No acute bowel wall thickening. Vascular/Lymphatic: Moderate aortic atherosclerosis. No aneurysm. No suspicious nodes Reproductive: Status post hysterectomy. No  adnexal masses. Other: Negative for pelvic effusion or free air. Musculoskeletal: No acute osseous abnormality. Left-sided sacral stimulator. Right posterior ascending thoracic stimulator with lead entering the canal at approximate T10 level. IMPRESSION: 1. No CT evidence for  acute intra-abdominal or pelvic abnormality. 2. Chronic right pleural thickening with calcifications 3. Status post gastric bypass without adverse features at this time. Electronically Signed   By: Donavan Foil M.D.   On: 01/14/2021 20:05        Scheduled Meds: . Chlorhexidine Gluconate Cloth  6 each Topical Daily  . levothyroxine  75 mcg Oral Q0600  . sodium chloride flush  10-40 mL Intracatheter Q12H   Continuous Infusions: . sodium chloride Stopped (01/15/21 0454)  . doxycycline (VIBRAMYCIN) IV 125 mL/hr at 01/15/21 0455  . heparin 600 Units/hr (01/15/21 0455)  . piperacillin-tazobactam (ZOSYN)  IV    . vancomycin       LOS: 0 days    Time spent:35 mins. More than 50% of that time was spent in counseling and/or coordination of care.      Shelly Coss, MD [facility] P6/08/2020, 7:38 AM

## 2021-01-15 NOTE — Evaluation (Signed)
Occupational Therapy Evaluation Patient Details Name: Megan Brewer MRN: [id number] DOB: 1946-02-01 Today's Date: 01/15/2021    History of Present Illness Patient is a 75 year old female generalized fatigue chills tachycardia febrile. PMH includes lung cancer sp ressection, immunoglobulin deficiency (PTA Gammagard infusions), recurrent UTI  Sp Gastric bipass 8-10 y ago   Clinical Impression   Patient lives at home with spouse in two story home, can live main level and 3 steps into house from garage. At baseline patient reports independent no use AD and spouse is also active/independent. Currently patient overall min A for OOB/standing activity due to weakness and mild unsteadiness. Min A to pivot first to bedside commode, then able to take few steps to recliner with min A for safety. Recommend continued acute OT services to maximize patient endurance, strength, balance in order to facilitate D/C home.     Follow Up Recommendations  No OT follow up    Equipment Recommendations  None recommended by OT       Precautions / Restrictions Precautions Precautions: Fall Restrictions Weight Bearing Restrictions: No      Mobility Bed Mobility Overal bed mobility: Modified Independent                  Transfers Overall transfer level: Needs assistance Equipment used: 1 person hand held assist Transfers: Sit to/from Stand;Stand Pivot Transfers Sit to Stand: Min guard Stand pivot transfers: Min assist       General transfer comment: for steadying    Balance Overall balance assessment: Needs assistance Sitting-balance support: Feet supported Sitting balance-Leahy Scale: Good     Standing balance support: No upper extremity supported Standing balance-Leahy Scale: Fair Standing balance comment: static standing fair, poor for dynamic needing min A for safety/steadying                           ADL either performed or assessed with clinical judgement   ADL  Overall ADL's : Needs assistance/impaired     Grooming: Wash/dry face;Set up;Sitting   Upper Body Bathing: Set up;Sitting   Lower Body Bathing: Minimal assistance;Sit to/from stand Lower Body Bathing Details (indicate cue type and reason): for balance Upper Body Dressing : Set up;Sitting   Lower Body Dressing: Set up;Minimal assistance;Sitting/lateral leans;Sit to/from stand Lower Body Dressing Details (indicate cue type and reason): seated patient able to don shoes, min A in standing for balance Toilet Transfer: Minimal assistance;Stand-pivot;BSC Toilet Transfer Details (indicate cue type and reason): min A for steadying as patient with decreased overall strength Toileting- Clothing Manipulation and Hygiene: Minimal assistance;Sit to/from stand Toileting - Clothing Manipulation Details (indicate cue type and reason): for balance     Functional mobility during ADLs: Minimal assistance General ADL Comments: patient with global weakness/fatigue overall min A level with OOB mobility. anticipate will progress to D/C home                  Pertinent Vitals/Pain Pain Assessment: Faces Faces Pain Scale: No hurt     Hand Dominance Right   Extremity/Trunk Assessment Upper Extremity Assessment Upper Extremity Assessment: Generalized weakness   Lower Extremity Assessment Lower Extremity Assessment: Defer to PT evaluation   Cervical / Trunk Assessment Cervical / Trunk Assessment: Normal   Communication Communication Communication: No difficulties   Cognition Arousal/Alertness: Awake/alert Behavior During Therapy: WFL for tasks assessed/performed Overall Cognitive Status: Within Functional Limits for tasks assessed  General Comments: initially lethargic however with mobility becomes alert, oriented, very pleasant   General Comments  maintained 97% on room air during treatment            Home Living Family/patient expects to  be discharged to:: Private residence Living Arrangements: Spouse/significant other Available Help at Discharge: Family Type of Home: House Home Access: Stairs to enter [employer] of Steps: 6 front, 3 from garage   [place]: Two level;Able to live on main level with bedroom/bathroom     Bathroom Shower/Tub: Occupational psychologist: Standard     Home Equipment: Cane - single point;Walker - 2 wheels          Prior Functioning/Environment Level of Independence: Independent                 OT Problem List: Decreased strength;Decreased activity tolerance;Impaired balance (sitting and/or standing)      OT Treatment/Interventions: Self-care/ADL training;Patient/family education;Balance training;Therapeutic activities    OT Goals(Current goals can be found in the care plan section) Acute Rehab OT Goals Patient Stated Goal: home to husband OT Goal Formulation: With patient Time For Goal Achievement: 01/29/21 Potential to Achieve Goals: Good  OT Frequency: Min 2X/week    AM-PAC OT "6 Clicks" Daily Activity     Outcome Measure Help from another person eating meals?: None Help from another person taking care of personal grooming?: A Little Help from another person toileting, which includes using toliet, bedpan, or urinal?: A Little Help from another person bathing (including washing, rinsing, drying)?: A Little Help from another person to put on and taking off regular upper body clothing?: A Little Help from another person to put on and taking off regular lower body clothing?: A Little 6 Click Score: 19   End of Session Nurse Communication: Mobility status  Activity Tolerance: Patient tolerated treatment well Patient left: in chair;with call bell/phone within reach;with chair alarm set  OT Visit Diagnosis: Unsteadiness on feet (R26.81);Other abnormalities of gait and mobility (R26.89)                Time: 7711-6579 OT Time Calculation (min): 17  min Charges:  OT General Charges $OT Visit: 1 Visit OT Evaluation $OT Eval Low Complexity: [place] OT OT pager: [phone]  Rosemary Holms 01/15/2021, 12:38 PM

## 2021-01-15 NOTE — Progress Notes (Signed)
Left lower extremity venous duplex has been completed. Preliminary results can be found in CV Proc through chart review.   01/15/21 11:07 AM Megan Brewer RVT

## 2021-01-16 DIAGNOSIS — J9601 Acute respiratory failure with hypoxia: Secondary | ICD-10-CM | POA: Diagnosis present

## 2021-01-16 DIAGNOSIS — Z8262 Family history of osteoporosis: Secondary | ICD-10-CM | POA: Diagnosis not present

## 2021-01-16 DIAGNOSIS — R7989 Other specified abnormal findings of blood chemistry: Secondary | ICD-10-CM | POA: Diagnosis not present

## 2021-01-16 DIAGNOSIS — N189 Chronic kidney disease, unspecified: Secondary | ICD-10-CM | POA: Diagnosis present

## 2021-01-16 DIAGNOSIS — D839 Common variable immunodeficiency, unspecified: Secondary | ICD-10-CM | POA: Diagnosis present

## 2021-01-16 DIAGNOSIS — G4733 Obstructive sleep apnea (adult) (pediatric): Secondary | ICD-10-CM | POA: Diagnosis present

## 2021-01-16 DIAGNOSIS — I447 Left bundle-branch block, unspecified: Secondary | ICD-10-CM | POA: Diagnosis not present

## 2021-01-16 DIAGNOSIS — A0472 Enterocolitis due to Clostridium difficile, not specified as recurrent: Secondary | ICD-10-CM | POA: Diagnosis present

## 2021-01-16 DIAGNOSIS — R6883 Chills (without fever): Secondary | ICD-10-CM | POA: Diagnosis present

## 2021-01-16 DIAGNOSIS — M810 Age-related osteoporosis without current pathological fracture: Secondary | ICD-10-CM | POA: Diagnosis present

## 2021-01-16 DIAGNOSIS — M549 Dorsalgia, unspecified: Secondary | ICD-10-CM | POA: Diagnosis present

## 2021-01-16 DIAGNOSIS — A4181 Sepsis due to Enterococcus: Secondary | ICD-10-CM | POA: Diagnosis present

## 2021-01-16 DIAGNOSIS — R6520 Severe sepsis without septic shock: Secondary | ICD-10-CM | POA: Diagnosis not present

## 2021-01-16 DIAGNOSIS — E039 Hypothyroidism, unspecified: Secondary | ICD-10-CM | POA: Diagnosis present

## 2021-01-16 DIAGNOSIS — G894 Chronic pain syndrome: Secondary | ICD-10-CM | POA: Diagnosis present

## 2021-01-16 DIAGNOSIS — K219 Gastro-esophageal reflux disease without esophagitis: Secondary | ICD-10-CM | POA: Diagnosis present

## 2021-01-16 DIAGNOSIS — D801 Nonfamilial hypogammaglobulinemia: Secondary | ICD-10-CM | POA: Diagnosis present

## 2021-01-16 DIAGNOSIS — R778 Other specified abnormalities of plasma proteins: Secondary | ICD-10-CM | POA: Diagnosis not present

## 2021-01-16 DIAGNOSIS — I21A1 Myocardial infarction type 2: Secondary | ICD-10-CM | POA: Diagnosis present

## 2021-01-16 DIAGNOSIS — G9341 Metabolic encephalopathy: Secondary | ICD-10-CM | POA: Diagnosis present

## 2021-01-16 DIAGNOSIS — R6510 Systemic inflammatory response syndrome (SIRS) of non-infectious origin without acute organ dysfunction: Secondary | ICD-10-CM | POA: Diagnosis not present

## 2021-01-16 DIAGNOSIS — N390 Urinary tract infection, site not specified: Secondary | ICD-10-CM | POA: Diagnosis not present

## 2021-01-16 DIAGNOSIS — I129 Hypertensive chronic kidney disease with stage 1 through stage 4 chronic kidney disease, or unspecified chronic kidney disease: Secondary | ICD-10-CM | POA: Diagnosis present

## 2021-01-16 DIAGNOSIS — Z20822 Contact with and (suspected) exposure to covid-19: Secondary | ICD-10-CM | POA: Diagnosis present

## 2021-01-16 DIAGNOSIS — Z85118 Personal history of other malignant neoplasm of bronchus and lung: Secondary | ICD-10-CM | POA: Diagnosis not present

## 2021-01-16 DIAGNOSIS — E860 Dehydration: Secondary | ICD-10-CM | POA: Diagnosis present

## 2021-01-16 DIAGNOSIS — G2581 Restless legs syndrome: Secondary | ICD-10-CM | POA: Diagnosis present

## 2021-01-16 DIAGNOSIS — D6959 Other secondary thrombocytopenia: Secondary | ICD-10-CM | POA: Diagnosis present

## 2021-01-16 DIAGNOSIS — Z9682 Presence of neurostimulator: Secondary | ICD-10-CM | POA: Diagnosis not present

## 2021-01-16 DIAGNOSIS — Z66 Do not resuscitate: Secondary | ICD-10-CM | POA: Diagnosis present

## 2021-01-16 DIAGNOSIS — A419 Sepsis, unspecified organism: Secondary | ICD-10-CM | POA: Diagnosis not present

## 2021-01-16 LAB — CBC
HCT: 33.9 % — ABNORMAL LOW (ref 36.0–46.0)
Hemoglobin: 10.9 g/dL — ABNORMAL LOW (ref 12.0–15.0)
MCH: 29.6 pg (ref 26.0–34.0)
MCHC: 32.2 g/dL (ref 30.0–36.0)
MCV: 92.1 fL (ref 80.0–100.0)
Platelets: 73 10*3/uL — ABNORMAL LOW (ref 150–400)
RBC: 3.68 MIL/uL — ABNORMAL LOW (ref 3.87–5.11)
RDW: 13.2 % (ref 11.5–15.5)
WBC: 2.2 10*3/uL — ABNORMAL LOW (ref 4.0–10.5)
nRBC: 0 % (ref 0.0–0.2)

## 2021-01-16 LAB — BASIC METABOLIC PANEL
Anion gap: 6 (ref 5–15)
BUN: 16 mg/dL (ref 8–23)
CO2: 22 mmol/L (ref 22–32)
Calcium: 7.6 mg/dL — ABNORMAL LOW (ref 8.9–10.3)
Chloride: 109 mmol/L (ref 98–111)
Creatinine, Ser: 0.74 mg/dL (ref 0.44–1.00)
GFR, Estimated: >60 mL/min (ref >60)
Glucose, Bld: 69 mg/dL — ABNORMAL LOW (ref 70–99)
Potassium: 3.5 mmol/L (ref 3.5–5.1)
Sodium: 137 mmol/L (ref 135–145)

## 2021-01-16 LAB — PROCALCITONIN: Procalcitonin: 3.98 ng/mL

## 2021-01-16 MED ORDER — ROPINIROLE HCL 1 MG PO TABS
0.5000 mg | ORAL_TABLET | Freq: Three times a day (TID) | ORAL | Status: DC | PRN
  Administered 2021-01-16 – 2021-01-22 (×6): 0.5 mg via ORAL
  Filled 2021-01-16 (×6): qty 1

## 2021-01-16 MED ORDER — MIRTAZAPINE 15 MG PO TABS
7.5000 mg | ORAL_TABLET | Freq: Once | ORAL | Status: AC
  Administered 2021-01-16: 7.5 mg via ORAL
  Filled 2021-01-16: qty 1

## 2021-01-16 MED ORDER — TAMSULOSIN HCL 0.4 MG PO CAPS
0.4000 mg | ORAL_CAPSULE | Freq: Every day | ORAL | Status: DC
  Administered 2021-01-16 – 2021-01-22 (×7): 0.4 mg via ORAL
  Filled 2021-01-16 (×7): qty 1

## 2021-01-16 MED ORDER — KETOROLAC TROMETHAMINE 30 MG/ML IJ SOLN
30.0000 mg | Freq: Once | INTRAMUSCULAR | Status: AC
  Administered 2021-01-16: 30 mg via INTRAVENOUS
  Filled 2021-01-16: qty 1

## 2021-01-16 MED ORDER — AMLODIPINE BESYLATE 5 MG PO TABS
5.0000 mg | ORAL_TABLET | Freq: Every day | ORAL | Status: DC
  Administered 2021-01-16 – 2021-01-17 (×2): 5 mg via ORAL
  Filled 2021-01-16 (×3): qty 1

## 2021-01-16 MED ORDER — METOCLOPRAMIDE HCL 5 MG/ML IJ SOLN
10.0000 mg | Freq: Once | INTRAMUSCULAR | Status: AC
  Administered 2021-01-16: 10 mg via INTRAVENOUS
  Filled 2021-01-16: qty 2

## 2021-01-16 MED ORDER — SUMATRIPTAN SUCCINATE 50 MG PO TABS
50.0000 mg | ORAL_TABLET | ORAL | Status: DC | PRN
  Administered 2021-01-21: 50 mg via ORAL
  Filled 2021-01-16 (×4): qty 1

## 2021-01-16 MED ORDER — DIPHENHYDRAMINE HCL 50 MG/ML IJ SOLN
25.0000 mg | Freq: Once | INTRAMUSCULAR | Status: AC
  Administered 2021-01-16: 25 mg via INTRAVENOUS
  Filled 2021-01-16: qty 1

## 2021-01-16 NOTE — Progress Notes (Addendum)
Progress Note  Patient Name: Megan Brewer Date of Encounter: 01/16/2021  [facility] Cardiologist: New to Dr Percival Spanish   Subjective   Patient states she is feeling much improved. She states she was told her had C diff infection. She denied having any diarrhea. She said she has some mild chills, without any fever, rigors, abdominal pain. She no longer having any chest pain, chronic SOB is unchanged. She wishes SCDs can be prescribed at home, it seems helping her swelling a lot.   Inpatient Medications    Scheduled Meds: . amLODipine  5 mg Oral Daily  . Chlorhexidine Gluconate Cloth  6 each Topical Daily  . diphenhydrAMINE  25 mg Intravenous Once  . ketorolac  30 mg Intravenous Once  . levothyroxine  75 mcg Oral Q0600  . mouth rinse  15 mL Mouth Rinse BID  . metoCLOPramide (REGLAN) injection  10 mg Intravenous Once  . pantoprazole  40 mg Oral Daily  . sodium chloride flush  10-40 mL Intracatheter Q12H  . vancomycin  125 mg Oral QID   Continuous Infusions: . sodium chloride 75 mL/hr at 01/16/21 0834  . piperacillin-tazobactam (ZOSYN)  IV Stopped (01/16/21 0908)   PRN Meds: oxyCODONE, sodium chloride flush, SUMAtriptan   Vital Signs    Vitals:   01/16/21 0700 01/16/21 0800 01/16/21 0900 01/16/21 0915  BP: (!) 153/65 (!) 155/62 (!) 163/64   Pulse: 72 74 73 89  Resp: 11 16 15    Temp:  97.6 F (36.4 C)    TempSrc:  Oral    SpO2: 100% 100% 100% 100%  Weight:      Height:        Intake/Output Summary (Last 24 hours) at 01/16/2021 1119 Last data filed at 01/16/2021 1008 Gross per 24 hour  Intake 834.83 ml  Output 200 ml  Net 634.83 ml   Last 3 Weights 01/14/2021 01/11/2021 01/02/2021  Weight (lbs) 115 lb 123 lb 117 lb 12.8 oz  Weight (kg) 52.164 kg 55.792 kg 53.434 kg  Some encounter information is confidential and restricted. Go to Review Flowsheets activity to see all data.      Telemetry    Sinus rhythm with PACs, artifacts   - Personally Reviewed  ECG     No new tracing today  - Personally Reviewed  Physical Exam   GEN: No acute distress.  Sitting in chair.  Neck: No JVD Cardiac: RRR, no murmurs, rubs, or gallops.  Respiratory: Clear to auscultation bilaterally. On room air.  GI: Soft, nontender, non-distended  MS: Chronic venous stasis  of BLE with mild edema; No deformity. Neuro:  Nonfocal  Psych: Normal affect  Left chest port-A cath in place, dressing intact   Labs    High Sensitivity Troponin:   Recent Labs  Lab 01/14/21 2158 01/15/21 0030  TROPONINIHS 1,493* 1,089*      Chemistry Recent Labs  Lab 01/11/21 1214 01/14/21 1547 01/14/21 2158 01/15/21 0500 01/16/21 0538  NA 135 135 138 135 137  K 3.9 2.8* 2.9* 4.0 3.5  CL 101 101 104 106 109  CO2 25 23 25 25 22   GLUCOSE 102* 136* 142* 121* 69*  BUN 23 23 19 15 16   CREATININE 0.90 0.81 0.83 0.65 0.74  CALCIUM 8.8* 8.9 8.6* 8.1* 7.6*  PROT 8.0 7.2  --  6.5  --   ALBUMIN 3.6 3.4*  --  2.9*  --   AST 39 33  --  33  --   ALT 36 29  --  29  --   ALKPHOS 64 72  --  56  --   BILITOT 0.5 0.4  --  0.4  --   GFRNONAA >60 >60 >60 >60 >60  ANIONGAP 9 11 9  4* 6     Hematology Recent Labs  Lab 01/14/21 1547 01/15/21 0500 01/16/21 0538  WBC 6.0 6.4 2.2*  RBC 4.56 4.21 3.68*  HGB 13.6 12.9 10.9*  HCT 40.4 38.3 33.9*  MCV 88.6 91.0 92.1  MCH 29.8 30.6 29.6  MCHC 33.7 33.7 32.2  RDW 13.2 13.5 13.2  PLT 120* 105* 73*    BNP Recent Labs  Lab 01/11/21 1214  BNP 180.1*     DDimer  Recent Labs  Lab 01/14/21 2204  DDIMER 0.78*     Radiology    CT HEAD WO CONTRAST  Result Date: 01/15/2021 CLINICAL DATA:  Headache, nausea, somnolent EXAM: CT HEAD WITHOUT CONTRAST TECHNIQUE: Contiguous axial images were obtained from the base of the skull through the vertex without intravenous contrast. COMPARISON:  08/20/2020 FINDINGS: Brain: No acute infarct or hemorrhage. Lateral ventricles and midline structures are unremarkable. No acute extra-axial fluid collections. No  mass effect. Vascular: No hyperdense vessel or unexpected calcification. Skull: Normal. Negative for fracture or focal lesion. Sinuses/Orbits: Stable postsurgical changes of the bilateral maxillary sinuses and ethmoid air cells. Chronic mucosal thickening within the maxillary, ethmoid, and sphenoid sinuses. Other: None. IMPRESSION: 1. No acute intracranial process. 2. Chronic paranasal sinus disease as above. Electronically Signed   By: Randa Ngo M.D.   On: 01/15/2021 01:32   DG CHEST PORT 1 VIEW  Result Date: 01/14/2021 CLINICAL DATA:  Shortness of breath EXAM: PORTABLE CHEST 1 VIEW COMPARISON:  Film from earlier in the same day. FINDINGS: A cardiac shadow is stable. Left chest wall port is noted and stable. Postsurgical changes in the right lung are noted. Spinal stimulators are seen. Lungs are clear. IMPRESSION: No acute abnormality noted. Electronically Signed   By: Inez Catalina M.D.   On: 01/14/2021 22:28   DG Chest Port 1 View  Result Date: 01/14/2021 CLINICAL DATA:  Sepsis. EXAM: PORTABLE CHEST 1 VIEW COMPARISON:  Jan 11, 2021. FINDINGS: The heart size and mediastinal contours are within normal limits. Left subclavian Port-A-Cath is unchanged in position. No pneumothorax or pleural effusion is noted. Left lung is clear. Right infrahilar postoperative changes are again noted with associated scarring. The visualized skeletal structures are unremarkable. IMPRESSION: No definite acute abnormality Electronically Signed   By: Marijo Conception M.D.   On: 01/14/2021 15:39   ECHOCARDIOGRAM COMPLETE  Result Date: 01/15/2021    ECHOCARDIOGRAM REPORT   Patient Name:   Megan Brewer Date of Exam: 01/15/2021 Medical Rec #:  [id number]          Height:       63.0 in Accession #:    [id number]         Weight:       115.0 lb Date of Birth:  [birth year]         BSA:          1.528 m Patient Age:    75 years           BP:           175/109 mmHg Patient Gender: F                  HR:           89 bpm. Exam  Location:  Inpatient  Procedure: 2D Echo, Cardiac Doppler and Color Doppler Indications:    Elevated Troponin  History:        Patient has no prior history of Echocardiogram examinations.                 Risk Factors:Sleep Apnea. Chills, fever, headache, nausea,                 abdominal pain, chest pain, dyspnea, and leg swelling. Acute                 metabolic encephalopathy likely from sepsis. Common variable                 immune deficiency. Thyroid disease. GERD. Chronic kidney                 disease.  Sonographer:    Darlina Sicilian RDCS Referring Phys: [phone] ANASTASSIA DOUTOVA  Sonographer Comments: Suboptimal parasternal window, suboptimal apical window and Technically difficult study due to poor echo windows. IMPRESSIONS  1. Non-traditional imaging windows limit the diagostic yield of this exam. Measurements and assessment of valvular heart disease are approximate values.  2. Left ventricular ejection fraction, by estimation, is 55 to 60%. The left ventricle has normal function. Left ventricular endocardial border not optimally defined to evaluate regional wall motion. Left ventricular diastolic parameters are indeterminate.  3. Right ventricular systolic function is normal. The right ventricular size is normal. There is normal pulmonary artery systolic pressure. The estimated right ventricular systolic pressure is 44.3 mmHg.  4. The mitral valve is degenerative. Mild to moderate mitral valve regurgitation. Moderate mitral annular calcification.  5. Tricuspid valve regurgitation is mild to moderate.  6. The aortic valve is grossly normal. Aortic valve regurgitation is mild to moderate. No aortic stenosis by visual assessment of the valve, Doppler assessment suboptimal.  7. The inferior vena cava is normal in size with greater than 50% respiratory variability, suggesting right atrial pressure of 3 mmHg. FINDINGS  Left Ventricle: Left ventricular ejection fraction, by estimation, is 55 to 60%. The left  ventricle has normal function. Left ventricular endocardial border not optimally defined to evaluate regional wall motion. The left ventricular internal cavity size was normal in size. There is no left ventricular hypertrophy. Left ventricular diastolic parameters are indeterminate. Right Ventricle: The right ventricular size is normal. No increase in right ventricular wall thickness. Right ventricular systolic function is normal. There is normal pulmonary artery systolic pressure. The tricuspid regurgitant velocity is 2.84 m/s, and  with an assumed right atrial pressure of 3 mmHg, the estimated right ventricular systolic pressure is 15.4 mmHg. Left Atrium: Left atrial size was normal in size. Right Atrium: Right atrial size was normal in size. Pericardium: There is no evidence of pericardial effusion. Mitral Valve: The mitral valve is degenerative in appearance. Moderate mitral annular calcification. Mild to moderate mitral valve regurgitation. Tricuspid Valve: The tricuspid valve is grossly normal. Tricuspid valve regurgitation is mild to moderate. Aortic Valve: The aortic valve is grossly normal. Aortic valve regurgitation is mild to moderate. No aortic stenosis is present. Pulmonic Valve: The pulmonic valve was not well visualized. Pulmonic valve regurgitation is trivial. No evidence of pulmonic stenosis. Aorta: The aortic root was not well visualized. Venous: The inferior vena cava is normal in size with greater than 50% respiratory variability, suggesting right atrial pressure of 3 mmHg. IAS/Shunts: No atrial level shunt detected by color flow Doppler.  LEFT VENTRICLE PLAX 2D LVOT diam:     1.80 cm  Diastology LV SV:         43       LV e' medial:    6.64 cm/s LV SV Index:   28       LV E/e' medial:  12.5 LVOT Area:     2.54 cm LV e' lateral:   6.64 cm/s                         LV E/e' lateral: 12.5  RIGHT VENTRICLE RV S prime:     12.10 cm/s TAPSE (M-mode): 2.2 cm LEFT ATRIUM             Index       RIGHT  ATRIUM          Index LA Vol (A2C):   31.8 ml 20.81 ml/m RA Area:     8.13 cm LA Vol (A4C):   34.9 ml 22.83 ml/m RA Volume:   13.80 ml 9.03 ml/m LA Biplane Vol: 33.8 ml 22.11 ml/m  AORTIC VALVE LVOT Vmax:   94.90 cm/s LVOT Vmean:  62.400 cm/s LVOT VTI:    0.170 m  AORTA Ao Root diam: 2.60 cm MITRAL VALVE                TRICUSPID VALVE MV Area (PHT): 3.42 cm     TR Peak grad:   32.3 mmHg MV Decel Time: 222 msec     TR Vmax:        284.00 cm/s MV E velocity: 83.20 cm/s MV A velocity: 110.00 cm/s  SHUNTS MV E/A ratio:  0.76         Systemic VTI:  0.17 m                             Systemic Diam: 1.80 cm Cherlynn Kaiser MD Electronically signed by Cherlynn Kaiser MD Signature Date/Time: 01/15/2021/9:57:32 AM    Final    CT Renal Stone Study  Result Date: 01/14/2021 CLINICAL DATA:  Abdomen pain hematuria EXAM: CT ABDOMEN AND PELVIS WITHOUT CONTRAST TECHNIQUE: Multidetector CT imaging of the abdomen and pelvis was performed following the standard protocol without IV contrast. COMPARISON:  CT 12/09/2020 FINDINGS: Lower chest: Lung bases demonstrate chronic right pleural thickening with calcifications. Slight rightward deviation of distal esophagus which contains radiopaque material. Hepatobiliary: Status post cholecystectomy. Stable intra and extrahepatic biliary dilatation. No focal hepatic abnormality. Pancreas: Unremarkable. No pancreatic ductal dilatation or surrounding inflammatory changes. Spleen: Normal in size without focal abnormality. Adrenals/Urinary Tract: Thickened adrenal glands without dominant mass. Kidneys show no hydronephrosis or ureteral stone. Distended urinary bladder Stomach/Bowel: Status post gastric bypass. No obstructive changes. No acute bowel wall thickening. Vascular/Lymphatic: Moderate aortic atherosclerosis. No aneurysm. No suspicious nodes Reproductive: Status post hysterectomy. No adnexal masses. Other: Negative for pelvic effusion or free air. Musculoskeletal: No acute osseous  abnormality. Left-sided sacral stimulator. Right posterior ascending thoracic stimulator with lead entering the canal at approximate T10 level. IMPRESSION: 1. No CT evidence for acute intra-abdominal or pelvic abnormality. 2. Chronic right pleural thickening with calcifications 3. Status post gastric bypass without adverse features at this time. Electronically Signed   By: Donavan Foil M.D.   On: 01/14/2021 20:05   VAS [place] LOWER EXTREMITY VENOUS (DVT)  Result Date: 01/15/2021  Lower Venous DVT Study Patient Name:  KALICIA DUFRESNE  Date of Exam:   01/15/2021 Medical Rec #: [id number]  Accession #:    [id number] Date of Birth: 04-06-1946          Patient Gender: F Patient Age:   74Y Exam Location:  [facility] Procedure:      VAS [place] LOWER EXTREMITY VENOUS (DVT) Referring Phys: Sesser --------------------------------------------------------------------------------  Indications: Edema.  Risk Factors: None identified. Comparison Study: 01/11/2021 - Negative for DVT on the right. Performing Technologist: Oliver Hum RVT  Examination Guidelines: A complete evaluation includes B-mode imaging, spectral Doppler, color Doppler, and power Doppler as needed of all accessible portions of each vessel. Bilateral testing is considered an integral part of a complete examination. Limited examinations for reoccurring indications may be performed as noted. The reflux portion of the exam is performed with the patient in reverse Trendelenburg.  +-----+---------------+---------+-----------+----------+--------------+ RIGHTCompressibilityPhasicitySpontaneityPropertiesThrombus Aging +-----+---------------+---------+-----------+----------+--------------+ CFV  Full           Yes      Yes                                 +-----+---------------+---------+-----------+----------+--------------+   +---------+---------------+---------+-----------+----------+--------------+ LEFT      CompressibilityPhasicitySpontaneityPropertiesThrombus Aging +---------+---------------+---------+-----------+----------+--------------+ CFV      Full           Yes      Yes                                 +---------+---------------+---------+-----------+----------+--------------+ SFJ      Full                                                        +---------+---------------+---------+-----------+----------+--------------+ FV Prox  Full                                                        +---------+---------------+---------+-----------+----------+--------------+ FV Mid   Full                                                        +---------+---------------+---------+-----------+----------+--------------+ FV DistalFull                                                        +---------+---------------+---------+-----------+----------+--------------+ PFV      Full                                                        +---------+---------------+---------+-----------+----------+--------------+ POP      Full           Yes      Yes                                 +---------+---------------+---------+-----------+----------+--------------+  PTV      Full                                                        +---------+---------------+---------+-----------+----------+--------------+ PERO     Full                                                        +---------+---------------+---------+-----------+----------+--------------+     Summary: RIGHT: - No evidence of common femoral vein obstruction.  LEFT: - There is no evidence of deep vein thrombosis in the lower extremity.  - No cystic structure found in the popliteal fossa.  *See table(s) above for measurements and observations. Electronically signed by Harold Barban MD on 01/15/2021 at 10:18:34 PM.    Final     Cardiac Studies   Lexiscan Myoveiw 03/29/19:   The left ventricular ejection fraction is  normal (55-65%).  Nuclear stress EF: 67%.  There was no ST segment deviation noted during stress.  The study is normal.  This is a low risk study.  Normal resting and stress perfusion. No ischemia or infarction EF 67%    Patient Profile     Charnise Lovan is a 75 y.o. female with a hx of  chronic pain syndrome, LBBB, polypharmacy, restless leg syndrome, essential tremor, GERD, hypogammaglobulinemia on q3week gammagard infusion, depression, hypothyroidism, OSA, migraine, gastric bypass, right lung cancer s/p lobectomy and chemotherapy (8ys ago), who is being seen 01/15/2021 for the evaluation of elevated troponin at the request of Dr. Roel Cluck.   Assessment & Plan    Chest pain, resolved  Elevated troponin Mild to Mod MR and TR  - patient endorse one episode of mid-sternum chest pain at admission when she was having significant chills, SOB, nausea ; chest pain has resolved since admission  - Lexiscan Myoview from 03/2019 is  low risk and normal - Hs troponin 1493 >1089 - EKG with old LBBB, no acute ischemic changes, initial ST depression likely rate mediated  - Echo repeated 01/15/21 showed LVEF 55-60%, unable evaluate RWMA, diastolic function indeterminate, RV normal, normal PASP, mild to moderate MR, mild to moderate TR - suspect demand ischemia, recommend outpatient ischemic evaluation with Lexiscan when acute sepsis/illness resolves, no urgent inpatient cardiac workup at this time   Sepsis 2/2 suspected bacteremia  Leukopenia, new 6/2 Thrombocytopenia, POA, worsening   - Port-A-Cath and peripheral blood culture 2/2  + gram variable rods, Procal elevated, home exposure to COVID + family, POA - CT negative for acute intra-abdominal or pelvic abnormality; CXR clear , POA - query bacteremia from Port-A-Cath infection as the source, consider Port-A-Cath removal  - repeat blood culture x2 today pending  - C diff toxin  from 01/03/21 appears negative, she is not having diarrhea  -  management per primary team/ID   Hypertension  - BP elevated since admission, up to 177/62,  no hx of HTN or on meds in the past - stated on amlodipine 5mg   by primary team here, ok to continue   Hypokalemia/Hypomagnesium - resolved with replacement   Hypogammaglobulinemia  - on routine gammagrad infusion at [facility]  Chronic pain syndrome  Depression Restless leg syndrome  GERD Hypothyroidism Polypharmacy  -Management per primary team    For questions or updates, please contact [place] Please consult [email or web address] for contact info under       Signed, Margie Billet, NP  01/16/2021, 11:19 AM    History and all data above reviewed.  Patient examined.  I agree with the findings as above.    She has had no further chest pain.  No new SOB. The patient exam reveals COR:RRR, systolic murmur holosystolic at the 3rd left interspace  ,  Lungs: Clear  ,  Abd: Positive bowel sounds, no rebound no guarding, Ext no edema  .  All available labs, radiology testing, previous records reviewed. Agree with documented assessment and plan.   Chest pain.  No plan for further in patient work up.  The plan will be for follow up and possible out patient Lexiscan Myoview.  Echo OK.  Mild MR and TR.  Follow clinically.   Jeneen Rinks Charlen Bakula  1:51 PM  01/16/2021

## 2021-01-16 NOTE — Progress Notes (Signed)
[facility] Infectious Disease   Reason for visit: Follow up on bacteremia  Interval History: remains afebrile since her initial fever on admission.  + leukopenia today.  Day 3 piperacillin/tazobactam She is having no diarrhea, none since Monday 5/30.    family member at bedside  Physical Exam: Constitutional:  Vitals:   01/16/21 1135 01/16/21 1200  BP: (!) 152/69   Pulse: 83   Resp: (!) 21   Temp:  98.1 F (36.7 C)  SpO2: 100%    patient appears in NAD Respiratory: Normal respiratory effort; CTA B Cardiovascular: RRR GI: soft, nt, nd  Review of Systems: Constitutional: negative for fevers, chills, anorexia and weight loss Gastrointestinal: negative for nausea and diarrhea Integument/breast: negative for rash Musculoskeletal: negative for myalgias and arthralgias  Lab Results  Component Value Date   WBC 2.2 (L) 01/16/2021   HGB 10.9 (L) 01/16/2021   HCT 33.9 (L) 01/16/2021   MCV 92.1 01/16/2021   PLT 73 (L) 01/16/2021    Lab Results  Component Value Date   CREATININE 0.74 01/16/2021   BUN 16 01/16/2021   NA 137 01/16/2021   K 3.5 01/16/2021   CL 109 01/16/2021   CO2 22 01/16/2021    Lab Results  Component Value Date   ALT 29 01/15/2021   AST 33 01/15/2021   ALKPHOS 56 01/15/2021     Microbiology: Recent Results (from the past 240 hour(s))  Clostridium Difficile by PCR     Status: Abnormal   Collection Time: 01/14/21 10:00 AM   Specimen: STOOL   Stool  Result Value Ref Range Status   Toxigenic C. Difficile by PCR Positive (A) Negative Final    Comment: Toxigenic C difficile: Positive Epidemic Strain Bl/NAP1/027: Presumptive Negative   Blood Culture (routine x 2)     Status: Abnormal (Preliminary result)   Collection Time: 01/14/21  3:46 PM   Specimen: BLOOD  Result Value Ref Range Status   Specimen Description   Final    BLOOD LEFT CHEST PORTA CATH Performed at [facility], [address] [place], [place] [address]     Special Requests   Final    BOTTLES DRAWN AEROBIC AND ANAEROBIC Blood Culture adequate volume Performed at [facility], [address] [place], [place] [address]    Culture  Setup Time (A)  Final    GRAM VARIABLE ROD AEROBIC BOTTLE ONLY CRITICAL RESULT CALLED TO, READ BACK BY AND VERIFIED WITH: PHRMD J LEGGE [id number] AT 1059 AM BY CM    Culture (A)  Final    GRAM VARIABLE ROD IDENTIFICATION AND SUSCEPTIBILITIES TO FOLLOW Performed at [facility], [place] [address], [place], [place] [address]    Report Status PENDING  Incomplete  Blood Culture ID Panel (Reflexed)     Status: None   Collection Time: 01/14/21  3:46 PM  Result Value Ref Range Status   Enterococcus faecalis NOT DETECTED NOT DETECTED Final   Enterococcus Faecium NOT DETECTED NOT DETECTED Final   Listeria monocytogenes NOT DETECTED NOT DETECTED Final   Staphylococcus species NOT DETECTED NOT DETECTED Final   Staphylococcus aureus (BCID) NOT DETECTED NOT DETECTED Final   Staphylococcus epidermidis NOT DETECTED NOT DETECTED Final   Staphylococcus lugdunensis NOT DETECTED NOT DETECTED Final   Streptococcus species NOT DETECTED NOT DETECTED Final   Streptococcus agalactiae NOT DETECTED NOT DETECTED Final   Streptococcus pneumoniae NOT DETECTED NOT DETECTED Final   Streptococcus pyogenes NOT DETECTED NOT DETECTED  Final   A.calcoaceticus-baumannii NOT DETECTED NOT DETECTED Final   Bacteroides fragilis NOT DETECTED NOT DETECTED Final   Enterobacterales NOT DETECTED NOT DETECTED Final   Enterobacter cloacae complex NOT DETECTED NOT DETECTED Final   Escherichia coli NOT DETECTED NOT DETECTED Final   Klebsiella aerogenes NOT DETECTED NOT DETECTED Final   Klebsiella oxytoca NOT DETECTED NOT DETECTED Final   Klebsiella pneumoniae NOT DETECTED NOT DETECTED Final   Proteus species NOT DETECTED NOT DETECTED Final   Salmonella species NOT DETECTED NOT DETECTED Final   Serratia marcescens NOT DETECTED NOT DETECTED Final    Haemophilus influenzae NOT DETECTED NOT DETECTED Final   Neisseria meningitidis NOT DETECTED NOT DETECTED Final   Pseudomonas aeruginosa NOT DETECTED NOT DETECTED Final   Stenotrophomonas maltophilia NOT DETECTED NOT DETECTED Final   Candida albicans NOT DETECTED NOT DETECTED Final   Candida auris NOT DETECTED NOT DETECTED Final   Candida glabrata NOT DETECTED NOT DETECTED Final   Candida krusei NOT DETECTED NOT DETECTED Final   Candida parapsilosis NOT DETECTED NOT DETECTED Final   Candida tropicalis NOT DETECTED NOT DETECTED Final   Cryptococcus neoformans/gattii NOT DETECTED NOT DETECTED Final    Comment: Performed at [facility], [place]. [address], [place], [place] [address]  Urine culture     Status: Abnormal (Preliminary result)   Collection Time: 01/14/21  3:47 PM   Specimen: In/Out Cath Urine  Result Value Ref Range Status   Specimen Description   Final    IN/OUT CATH URINE Performed at [facility], [address] [place], [place] [address]    Special Requests   Final    NONE Performed at [facility], [address] [place], [place] [address]    Culture (A)  Final    >=100,000 COLONIES/mL ENTEROCOCCUS FAECALIS SUSCEPTIBILITIES TO FOLLOW Performed at [facility], [place] [address], [place],  [address]    Report Status PENDING  Incomplete  Resp Panel by RT-PCR (Flu A&B, Covid) Nasopharyngeal Swab     Status: None   Collection Time: 01/14/21  3:47 PM   Specimen: Nasopharyngeal Swab; Nasopharyngeal(NP) swabs in vial transport medium  Result Value Ref Range Status   SARS Coronavirus 2 by RT PCR NEGATIVE NEGATIVE Final    Comment: (NOTE) SARS-CoV-2 target nucleic acids are NOT DETECTED.  The SARS-CoV-2 RNA is generally detectable in upper respiratory specimens during the acute phase of infection. The lowest concentration of SARS-CoV-2 viral copies this assay can detect is 138 copies/mL. A negative result does not preclude  SARS-Cov-2 infection and should not be used as the sole basis for treatment or other patient management decisions. A negative result may occur with  improper specimen collection/handling, submission of specimen other than nasopharyngeal swab, presence of viral mutation(s) within the areas targeted by this assay, and inadequate number of viral copies(<138 copies/mL). A negative result must be combined with clinical observations, patient history, and epidemiological information. The expected result is Negative.  Fact Sheet for Patients:  EntrepreneurPulse.com.au  Fact Sheet for Healthcare Providers:  IncredibleEmployment.be  This test is no t yet approved or cleared by the [place] FDA and  has been authorized for detection and/or diagnosis of SARS-CoV-2 by FDA under an Emergency Use Authorization (EUA). This EUA will remain  in effect (meaning this test can be used) for the duration of the COVID-19 declaration under Section 564(b)(1) of the Act, 21 U.S.C.section 360bbb-3(b)(1), unless the authorization is terminated  or revoked sooner.  Influenza A by PCR NEGATIVE NEGATIVE Final   Influenza B by PCR NEGATIVE NEGATIVE Final    Comment: (NOTE) The Xpert Xpress SARS-CoV-2/FLU/RSV plus assay is intended as an aid in the diagnosis of influenza from Nasopharyngeal swab specimens and should not be used as a sole basis for treatment. Nasal washings and aspirates are unacceptable for Xpert Xpress SARS-CoV-2/FLU/RSV testing.  Fact Sheet for Patients: EntrepreneurPulse.com.au  Fact Sheet for Healthcare Providers: IncredibleEmployment.be  This test is not yet approved or cleared by the [place] FDA and has been authorized for detection and/or diagnosis of SARS-CoV-2 by FDA under an Emergency Use Authorization (EUA). This EUA will remain in effect (meaning this test can be used) for the duration of  the COVID-19 declaration under Section 564(b)(1) of the Act, 21 U.S.C. section 360bbb-3(b)(1), unless the authorization is terminated or revoked.  Performed at [facility], [address] [place], [place] [address]   Blood Culture (routine x 2)     Status: Abnormal (Preliminary result)   Collection Time: 01/14/21  3:51 PM   Specimen: BLOOD LEFT ARM  Result Value Ref Range Status   Specimen Description   Final    BLOOD LEFT ARM Performed at [facility], [address] [place], [place] [address]    Special Requests   Final    BOTTLES DRAWN AEROBIC AND ANAEROBIC Blood Culture adequate volume Performed at [facility], [address] [place], [place] [address]    Culture  Setup Time (A)  Final    GRAM VARIABLE ROD AEROBIC BOTTLE ONLY CRITICAL VALUE NOTED.  VALUE IS CONSISTENT WITH PREVIOUSLY REPORTED AND CALLED VALUE.    Culture (A)  Final    GRAM VARIABLE ROD IDENTIFICATION TO FOLLOW Performed at [facility], [place] [address], [place], [place] [address]    Report Status PENDING  Incomplete  Respiratory (~20 pathogens) panel by PCR     Status: None   Collection Time: 01/14/21  7:22 PM   Specimen: Nasopharyngeal Swab; Respiratory  Result Value Ref Range Status   Adenovirus NOT DETECTED NOT DETECTED Final   Coronavirus 229E NOT DETECTED NOT DETECTED Final    Comment: (NOTE) The Coronavirus on the Respiratory Panel, DOES NOT test for the novel  Coronavirus (2019 nCoV)    Coronavirus HKU1 NOT DETECTED NOT DETECTED Final   Coronavirus NL63 NOT DETECTED NOT DETECTED Final   Coronavirus OC43 NOT DETECTED NOT DETECTED Final   Metapneumovirus NOT DETECTED NOT DETECTED Final   Rhinovirus / Enterovirus NOT DETECTED NOT DETECTED Final   Influenza A NOT DETECTED NOT DETECTED Final   Influenza B NOT DETECTED NOT DETECTED Final   Parainfluenza Virus 1 NOT DETECTED NOT DETECTED Final   Parainfluenza Virus 2 NOT DETECTED NOT DETECTED Final    Parainfluenza Virus 3 NOT DETECTED NOT DETECTED Final   Parainfluenza Virus 4 NOT DETECTED NOT DETECTED Final   Respiratory Syncytial Virus NOT DETECTED NOT DETECTED Final   Bordetella pertussis NOT DETECTED NOT DETECTED Final   Bordetella Parapertussis NOT DETECTED NOT DETECTED Final   Chlamydophila pneumoniae NOT DETECTED NOT DETECTED Final   Mycoplasma pneumoniae NOT DETECTED NOT DETECTED Final    Comment: Performed at [facility], [place] [address], [place], [place] [address]  MRSA PCR Screening     Status: None   Collection Time: 01/14/21 11:15 PM   Specimen: Nasopharyngeal  Result Value Ref Range Status   MRSA by PCR NEGATIVE NEGATIVE Final  Comment:        The GeneXpert MRSA Assay (FDA approved for NASAL specimens only), is one component of a comprehensive MRSA colonization surveillance program. It is not intended to diagnose MRSA infection nor to guide or monitor treatment for MRSA infections. Performed at [facility], [place] [address] [place], [place] [address]     Impression/Plan:  1. Bacteremia - gram variable rod in one bottle of each set, ID not done yet.  ? Listeria with the recent diarrhea.  Will wait for ID, continue piperacillin/tazobactam.   2.  Diarrhea - resolved no, no further issues.  She had a recent C diff PCR test that was positive but now without symptoms and C diff culture and other studies negative.  Not consistent with active C diff.  No indication for oral vancomycin.    3.  Leukopenia - lower than baseline.  Will recheck tomorrow.

## 2021-01-16 NOTE — Progress Notes (Signed)
Occupational Therapy Treatment Patient Details Name: Megan Brewer MRN: [id number] DOB: Feb 20, 1946 Today's Date: 01/16/2021    History of present illness Patient is a 75 year old female generalized fatigue chills tachycardia febrile. PMH includes lung cancer sp ressection, immunoglobulin deficiency (PTA Gammagard infusions), recurrent UTI  Sp Gastric bipass 8-10 y ago   OT comments  Patient progressing and showed improved balance during in-room functional mobility including standing at sink for grooming, compared to previous session. Pt mobilized out of bed with HOB elevated but without external assistance and is cooperative with taking meals in her chair.  Patient remains limited by decreased activity tolerance and generalized weakness, along with deficits noted below. Pt continues to demonstrate good rehab potential and would benefit from continued skilled OT to increase safety and independence with ADLs and functional transfers to allow pt to return home safely and reduce caregiver burden and fall risk.   Follow Up Recommendations  No OT follow up    Equipment Recommendations  None recommended by OT    Recommendations for Other Services      Precautions / Restrictions Precautions Precautions: Fall Precaution Comments: Droplet/Contact Restrictions Weight Bearing Restrictions: No       Mobility Bed Mobility Overal bed mobility: Modified Independent               Patient Response: Cooperative  Transfers Overall transfer level: Needs assistance   Transfers: Sit to/from Stand;Stand Pivot Transfers Sit to Stand: Supervision Stand pivot transfers: Supervision            Balance Overall balance assessment: Needs assistance Sitting-balance support: Feet supported;No upper extremity supported Sitting balance-Leahy Scale: Good     Standing balance support: No upper extremity supported Standing balance-Leahy Scale: Fair Standing balance comment: static standing  fair at sink.                           ADL either performed or assessed with clinical judgement   ADL Overall ADL's : Needs assistance/impaired Eating/Feeding: Independent;Sitting   Grooming: Wash/dry face;Standing;Supervision/safety;Wash/dry hands               Lower Body Dressing: Set up;Sitting/lateral leans;Sit to/from stand;Minimal assistance;Min guard Lower Body Dressing Details (indicate cue type and reason): seated patient able to don shoes, min Guard managing gowns in standing for balance.   Toilet Transfer Details (indicate cue type and reason): Pt transfered to recliner. In room ambulation without AD from EOB to sink in room then to recliner ~10' x 2 with standing at sink in between.                 Vision   Vision Assessment?: No apparent visual deficits   Perception     Praxis      Cognition Arousal/Alertness: Awake/alert Behavior During Therapy: WFL for tasks assessed/performed Overall Cognitive Status: Within Functional Limits for tasks assessed                                          Exercises     Shoulder Instructions       General Comments      Pertinent Vitals/ Pain       Pain Assessment: No/denies pain Faces Pain Scale: No hurt  Home Living  Prior Functioning/Environment              Frequency  Min 2X/week        Progress Toward Goals  OT Goals(current goals can now be found in the care plan section)  Progress towards OT goals: Progressing toward goals  Acute Rehab OT Goals Patient Stated Goal: home to husband OT Goal Formulation: With patient Time For Goal Achievement: 01/29/21 Potential to Achieve Goals: Good  Plan Discharge plan remains appropriate    Co-evaluation                 AM-PAC OT "6 Clicks" Daily Activity     Outcome Measure   Help from another person eating meals?: None Help from another person  taking care of personal grooming?: A Little Help from another person toileting, which includes using toliet, bedpan, or urinal?: A Little Help from another person bathing (including washing, rinsing, drying)?: A Little Help from another person to put on and taking off regular upper body clothing?: A Little Help from another person to put on and taking off regular lower body clothing?: A Little 6 Click Score: 19    End of Session    OT Visit Diagnosis: Unsteadiness on feet (R26.81);Other abnormalities of gait and mobility (R26.89)   Activity Tolerance Patient tolerated treatment well   Patient Left in chair;with call bell/phone within reach;with chair alarm set   Nurse Communication Mobility status (Off O2)        Time: 4970-2637 OT Time Calculation (min): 24 min  Charges: OT General Charges $OT Visit: 1 Visit OT Treatments $Self Care/Home Management : 8-22 mins $Therapeutic Activity: 8-22 mins  Anderson [place], [place] Office: [phone] 01/16/2021   Julien Girt 01/16/2021, 9:50 AM

## 2021-01-16 NOTE — Consult Note (Addendum)
NAME:  Megan Brewer, MRN:  [id number], DOB:  November 14, 1945, LOS: 0 ADMISSION DATE:  01/14/2021, CONSULTATION DATE:  01/14/2021 REFERRING MD:  Dr. Roel Cluck, [facility], CHIEF COMPLAINT:  Pain   History of Present Illness:  75 yo female presented to ER on 5/31 with chills, headache, nausea, abdominal pain, chest pain, dyspnea, and leg swelling.  She had negative COVID 19 test on 5/29 and again on 5/31.  She was found to have elevated troponin, elevated procalcitonin and fever 101.42F.  Started on antibiotics.  She follows commands, but has difficulty staying awake during evaluation.      Pertinent  Medical History  NSCLC dx 2011 s/p surgery and chemo at [facility], Common variable immune deficiency, Bipolar, Anemia, Chronic pain, s/p neurostimulator, CKD, GERD, Hypothyroidism, Migraine headache, Osteoporosis, Restless legs syndrome, OSA, Tremor, Urticaria, LBBB  Significant Hospital Events: Including procedures, antibiotic start and stop dates in addition to other pertinent events   . Transfer from [place], start vancomycin/zosyn/doxycycline  Interim History / Subjective:   No acute events overnight. Patient reports feeling better this morning and is feeling hungry. She continues to have headache and some backache. She has remained afebrile since 5/31.   Objective   Blood pressure (!) 153/65, pulse 72, temperature 97.6 F (36.4 C), temperature source Oral, resp. rate 11, height 5\' 3"  (1.6 m), weight 52.2 kg, SpO2 100 %.        Intake/Output Summary (Last 24 hours) at 01/16/2021 0815 Last data filed at 01/16/2021 0542 Gross per 24 hour  Intake 1176.23 ml  Output 200 ml  Net 976.23 ml   Filed Weights   01/14/21 1437  Weight: 52.2 kg    Examination:  General - no acute distress, awake, alert Eyes - sclera anicteric, PERRL ENT - moist mucous membranes Cardiac - regular rate/rhythm, no murmur Chest - clear to auscultation, no wheeze/rales Abdomen - soft, non tender, + bowel  sounds Extremities - no edema Skin - no rashes Neuro - awake, alert and oriented x 3, moving all extremities  Procal 4.97 > 3.9 Cr 0.83 > 0.74  5/31 CXR - no opacities or infiltrates. No effusions.  Resolved Hospital Problem list     Assessment & Plan:   Sepsis Bacteremia Present on admission. Blood culture showing gram variable rod in 2/2 bottle. She has port-a-cath in place for IVIG infusions. - repeat blood cultures given 2/2 positivity on admission cultures - ID following, antibiotics per ID  Acute metabolic encephalopathy likely from sepsis. Hx of Chronic pain, RLS, Bipolar. - Mental status back to baseline, sepsis related encephalopathy - CT head without acute pathology - outpt med list includes baclofen, buprenorphine-naloxone, vistaril, lamictal, remeron, requip, zanaflex - Will need to continue baclofen if able to avoid withdrawal issues.   Elevated troponin. Hx of LBBB. Demand ischemia suspected - f/u Echo - Cardiology Consulted  Common variable immune deficiency. - followed by Dr. Darliss Cheney with immunology at [facility] - gets gammagrad every 3 weeks as outpt, last dose last week  Hx of hypothyroidism. - per primary team  GERD with hx of gastric bypass. - per primary team  Hypokalemia. - f/u BMET  Mild thrombocytopenia. - f/u CBC  PCCM will sign off  Best practice (right click and "Reselect all SmartList Selections" daily)  Diet:  Oral Pain/Anxiety/Delirium protocol (if indicated): No VAP protocol (if indicated): Not indicated DVT prophylaxis: SCD GI prophylaxis: PPI Glucose control:  SSI No Central venous access:  N/A Arterial line:  N/A Foley:  N/A Mobility:  bed rest  PT consulted: N/A Last date of multidisciplinary goals of care discussion [Patient asked about life support today. We discussed that being intubated and being placed on a breathing machine along with chest compressions are considered life support. She expressed that she  would want life support for a short trial of 5-7 days, but would not want prolonged life support. She does want to be full code now] Code Status:  full code Disposition: Safe for transfer out of the ICU today.  Labs   CBC: Recent Labs  Lab 01/11/21 1214 01/14/21 1547 01/15/21 0500 01/16/21 0538  WBC 4.9 6.0 6.4 2.2*  NEUTROABS 3.2 5.6 6.0  --   HGB 13.7 13.6 12.9 10.9*  HCT 42.3 40.4 38.3 33.9*  MCV 90.8 88.6 91.0 92.1  PLT 165 120* 105* 73*    Basic Metabolic Panel: Recent Labs  Lab 01/11/21 1214 01/14/21 1547 01/14/21 2158 01/15/21 0030 01/15/21 0500 01/16/21 0538  NA 135 135 138  --  135 137  K 3.9 2.8* 2.9*  --  4.0 3.5  CL 101 101 104  --  106 109  CO2 25 23 25   --  25 22  GLUCOSE 102* 136* 142*  --  121* 69*  BUN 23 23 19   --  15 16  CREATININE 0.90 0.81 0.83  --  0.65 0.74  CALCIUM 8.8* 8.9 8.6*  --  8.1* 7.6*  MG  --  1.4*  --   --  3.0*  --   PHOS  --   --   --  3.2 2.8  --    GFR: Estimated Creatinine Clearance: 52.4 mL/min (by C-G formula based on SCr of 0.74 mg/dL). Recent Labs  Lab 01/11/21 1214 01/14/21 1547 01/14/21 1755 01/14/21 1922 01/14/21 2158 01/15/21 0500 01/16/21 0538  PROCALCITON  --   --   --  3.55 4.97  --  3.98  WBC 4.9 6.0  --   --   --  6.4 2.2*  LATICACIDVEN  --  1.3 1.0  --   --   --   --     Liver Function Tests: Recent Labs  Lab 01/11/21 1214 01/14/21 1547 01/15/21 0500  AST 39 33 33  ALT 36 29 29  ALKPHOS 64 72 56  BILITOT 0.5 0.4 0.4  PROT 8.0 7.2 6.5  ALBUMIN 3.6 3.4* 2.9*   No results for input(s): LIPASE, AMYLASE in the last 168 hours. No results for input(s): AMMONIA in the last 168 hours.  ABG    Component Value Date/Time   PHART 7.403 01/14/2021 2207   PCO2ART 44.2 01/14/2021 2207   PO2ART 153 (H) 01/14/2021 2207   HCO3 27.1 01/14/2021 2207   O2SAT 98.4 01/14/2021 2207     Coagulation Profile: Recent Labs  Lab 01/14/21 1547  INR 0.9    Cardiac Enzymes: Recent Labs  Lab 01/15/21 0030   CKTOTAL 78    HbA1C: No results found for: HGBA1C  CBG: Recent Labs  Lab 01/14/21 2201 01/15/21 1550  GLUCAP 142* 97    Signature:  Freda Jackson, MD [facility] Office: [phone]   See Amion for personal pager PCCM on call pager [phone] until 7pm. Please call Elink 7p-7a. [phone]

## 2021-01-16 NOTE — Progress Notes (Signed)
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  COVID screen test was negative.  On presentation patient was febrile, tachycardic.  Lab work showed hypokalemia, elevated troponin.  Patient was confused.  Patient was admitted for the management of severe sepsis.  Assessment & Plan:   Active Problems:   OBSTRUCTIVE SLEEP APNEA   Chronic pain syndrome   Hypogammaglobulinemia (HCC)   Left bundle branch block   Restless legs syndrome   Chronic kidney disease   Hypothyroidism   SIRS (systemic inflammatory response syndrome) (HCC)   Elevated troponin   Hypokalemia   Headache   Acute metabolic encephalopathy   Severe sepsis: Presented with fever with evidence of end organ damage, confusion, troponin elevation.  Elevated procalcitonin .started on IV antibiotics, IV fluids.    Patient has history of immunoglobulin deficiency. Chest x-ray, CT renal did not show any acute findings.  No clear source of sepsis at present.  Venous Doppler negative for DVT.  She has a port on the left chest.  Blood culture in the aerobic bottle now showing gram variable rod.  Currently on Zosyn.  Repeat blood cultures have been sent  C. difficile PCR positive: She was seen by GI as an outpatient few days ago for diarrhea and her C. difficile PCR was positive.  Started on vancomycin here.  Currently she does not have diarrhea  Acute metabolic encephalopathy: Most likely secondary to sepsis versus polypharmacy.  Much improved, currently alert and oriented. she was on multiple psychoactive medications at home which includes baclofen, Suboxone, Vistaril, Lamictal, Remeron, Requip,  Zanaflex. these are on hold.  CT head did not show any acute intracranial  abnormality.  Headache/back pain: Has history of migraines, has history of chronic back pain.  Continue supportive care.  Minimize sedatives.  Ordered migraine cocktail again today.  No signs of CNS infection.  Added sumatriptan  UTI: Denies any dysuria but urine cultures showing significant Enterococcus faecalis.  Currently on Zosyn, will follow susceptibility  Elevated troponin: Most likely secondary to supply demand ischemia from type II MI.  Echocardiogram showed ejection fraction of 55 to 60%, indeterminate left ventricular diastolic parameters.  Cardiology consulted.  Denies any chest pain.  Started on heparin by the admitting physician ,now discontinued   Has history of left bundle branch block.  EKG did not show signs of any ischemic changes  History of hypogammaglobinemia: History of common variable immune deficiency.  Followed by Dr. London Pepper with hematology at [facility] every 3 weeks as an outpatient.  GERD/history of gastric bypass: Continue Protonix  Leucopenia/Thrombocytopenia:   Continue to monitor  Hypothyroidism: Continue Synthyroid  Hypertension: Blood pressure constantly on the higher side.  Started on amlodipine 5 mg daily.  Urine retention:Started on Flomax  Generalized weakness/debility/deconditioning: Home health recommended after  PT/OT evaluation.           DVT prophylaxis:SCD Code Status: Full Family Communication:: Discussed with the husband on phone on 01/15/21 Status is: Observation  The patient remains OBS appropriate and will d/c before 2 midnights.  Dispo: The patient is from: Home              Anticipated d/c is to: Home  Patient currently is not medically stable to d/c.   Difficult to place patient No     Consultants: PCCM, cardiology  Procedures: None  Antimicrobials:  Anti-infectives (From admission, onward)   Start      Dose/Rate Route Frequency Ordered Stop   01/15/21 1815  vancomycin (VANCOCIN) capsule 125 mg        125 mg Oral 4 times daily 01/15/21 1718 01/25/21 1759   01/15/21 1800  vancomycin (VANCOCIN) 50 mg/mL oral solution 125 mg  Status:  Discontinued        125 mg Oral Every 6 hours 01/15/21 1654 01/15/21 1717   01/15/21 1700  vancomycin (VANCOREADY) IVPB 750 mg/150 mL  Status:  Discontinued        750 mg 150 mL/hr over 60 Minutes Intravenous Every 24 hours 01/14/21 1931 01/15/21 1344   01/15/21 0600  piperacillin-tazobactam (ZOSYN) IVPB 3.375 g        3.375 g 12.5 mL/hr over 240 Minutes Intravenous Every 8 hours 01/14/21 1931     01/15/21 0000  doxycycline (VIBRAMYCIN) 100 mg in sodium chloride 0.9 % 250 mL IVPB  Status:  Discontinued        100 mg 125 mL/hr over 120 Minutes Intravenous Every 12 hours 01/14/21 2310 01/15/21 1344   01/14/21 1930  piperacillin-tazobactam (ZOSYN) IVPB 3.375 g        3.375 g 100 mL/hr over 30 Minutes Intravenous  Once 01/14/21 1918 01/14/21 2005   01/14/21 1915  aztreonam (AZACTAM) 2 g in sodium chloride 0.9 % 100 mL IVPB  Status:  Discontinued        2 g 200 mL/hr over 30 Minutes Intravenous  Once 01/14/21 1901 01/14/21 1918   01/14/21 1915  metroNIDAZOLE (FLAGYL) IVPB 500 mg  Status:  Discontinued        500 mg 100 mL/hr over 60 Minutes Intravenous  Once 01/14/21 1901 01/14/21 1917   01/14/21 1915  vancomycin (VANCOCIN) IVPB 1000 mg/200 mL premix        1,000 mg 200 mL/hr over 60 Minutes Intravenous  Once 01/14/21 1901 01/14/21 2053      Subjective:  Patient seen and examined the bedside this morning.  Still hypertensive today.  Afebrile.  She looks much better today.  She still has some headache.  Back pain has improved.  She does not complain of any diarrhea or abdominal pain.   Objective: Vitals:   01/16/21 0400 01/16/21 0500 01/16/21 0600 01/16/21 0700  BP: (!) 159/62 (!) 149/60 (!) 177/62 (!) 153/65  Pulse: 70 70 76 72  Resp: 14 13 16 11    Temp: 98 F (36.7 C)     TempSrc: Oral     SpO2: 100% 100% 100% 100%  Weight:      Height:        Intake/Output Summary (Last 24 hours) at 01/16/2021 0739 Last data filed at 01/16/2021 0542 Gross per 24 hour  Intake 1176.23 ml  Output 200 ml  Net 976.23 ml   Filed Weights   01/14/21 1437  Weight: 52.2 kg    Examination:   General exam: Chronically ill looking, weak, overall comfortable HEENT: PERRL Respiratory system:  no wheezes or crackles  Cardiovascular system: S1 & S2 heard, RRR.  Chemo-Port on the left chest Gastrointestinal system: Abdomen is nondistended, soft and nontender. Central nervous system: Alert and oriented Extremities: No edema, no clubbing ,no cyanosis Skin: No rashes, no ulcers,no icterus    Data Reviewed: I have personally reviewed following labs and imaging  studies  CBC: Recent Labs  Lab 01/11/21 1214 01/14/21 1547 01/15/21 0500 01/16/21 0538  WBC 4.9 6.0 6.4 2.2*  NEUTROABS 3.2 5.6 6.0  --   HGB 13.7 13.6 12.9 10.9*  HCT 42.3 40.4 38.3 33.9*  MCV 90.8 88.6 91.0 92.1  PLT 165 120* 105* 73*   Basic Metabolic Panel: Recent Labs  Lab 01/11/21 1214 01/14/21 1547 01/14/21 2158 01/15/21 0030 01/15/21 0500 01/16/21 0538  NA 135 135 138  --  135 137  K 3.9 2.8* 2.9*  --  4.0 3.5  CL 101 101 104  --  106 109  CO2 25 23 25   --  25 22  GLUCOSE 102* 136* 142*  --  121* 69*  BUN 23 23 19   --  15 16  CREATININE 0.90 0.81 0.83  --  0.65 0.74  CALCIUM 8.8* 8.9 8.6*  --  8.1* 7.6*  MG  --  1.4*  --   --  3.0*  --   PHOS  --   --   --  3.2 2.8  --    GFR: Estimated Creatinine Clearance: 52.4 mL/min (by C-G formula based on SCr of 0.74 mg/dL). Liver Function Tests: Recent Labs  Lab 01/11/21 1214 01/14/21 1547 01/15/21 0500  AST 39 33 33  ALT 36 29 29  ALKPHOS 64 72 56  BILITOT 0.5 0.4 0.4  PROT 8.0 7.2 6.5  ALBUMIN 3.6 3.4* 2.9*   No results for input(s): LIPASE, AMYLASE in the last 168 hours. No results for input(s): AMMONIA in  the last 168 hours. Coagulation Profile: Recent Labs  Lab 01/14/21 1547  INR 0.9   Cardiac Enzymes: Recent Labs  Lab 01/15/21 0030  CKTOTAL 78   BNP (last 3 results) No results for input(s): PROBNP in the last 8760 hours. HbA1C: No results for input(s): HGBA1C in the last 72 hours. CBG: Recent Labs  Lab 01/14/21 2201 01/15/21 1550  GLUCAP 142* 97   Lipid Profile: No results for input(s): CHOL, HDL, LDLCALC, TRIG, CHOLHDL, LDLDIRECT in the last 72 hours. Thyroid Function Tests: Recent Labs    01/15/21 0500  TSH 2.990   Anemia Panel: No results for input(s): VITAMINB12, FOLATE, FERRITIN, TIBC, IRON, RETICCTPCT in the last 72 hours. Sepsis Labs: Recent Labs  Lab 01/14/21 1547 01/14/21 1755 01/14/21 1922 01/14/21 2158 01/16/21 0538  PROCALCITON  --   --  3.55 4.97 3.98  LATICACIDVEN 1.3 1.0  --   --   --     Recent Results (from the past 240 hour(s))  Clostridium Difficile by PCR     Status: Abnormal   Collection Time: 01/14/21 10:00 AM   Specimen: STOOL   Stool  Result Value Ref Range Status   Toxigenic C. Difficile by PCR Positive (A) Negative Final    Comment: Toxigenic C difficile: Positive Epidemic Strain Bl/NAP1/027: Presumptive Negative   Blood Culture (routine x 2)     Status: Abnormal (Preliminary result)   Collection Time: 01/14/21  3:46 PM   Specimen: BLOOD  Result Value Ref Range Status   Specimen Description   Final    BLOOD LEFT CHEST PORTA CATH Performed at [facility], [address] [place], [place] [address]    Special Requests   Final    BOTTLES DRAWN AEROBIC AND ANAEROBIC Blood Culture adequate volume Performed at [facility], [address] [place], [place] [address]    Culture  Setup Time (A)  Final    Lonell Grandchild  VARIABLE ROD AEROBIC BOTTLE ONLY CRITICAL RESULT CALLED TO, READ BACK BY AND VERIFIED WITH: PHRMD J LEGGE [id number] AT 1059 AM BY CM    Culture   Final    CULTURE REINCUBATED FOR BETTER  GROWTH Performed at [facility], [place] [address], [place], [place] [address]    Report Status PENDING  Incomplete  Blood Culture ID Panel (Reflexed)     Status: None   Collection Time: 01/14/21  3:46 PM  Result Value Ref Range Status   Enterococcus faecalis NOT DETECTED NOT DETECTED Final   Enterococcus Faecium NOT DETECTED NOT DETECTED Final   Listeria monocytogenes NOT DETECTED NOT DETECTED Final   Staphylococcus species NOT DETECTED NOT DETECTED Final   Staphylococcus aureus (BCID) NOT DETECTED NOT DETECTED Final   Staphylococcus epidermidis NOT DETECTED NOT DETECTED Final   Staphylococcus lugdunensis NOT DETECTED NOT DETECTED Final   Streptococcus species NOT DETECTED NOT DETECTED Final   Streptococcus agalactiae NOT DETECTED NOT DETECTED Final   Streptococcus pneumoniae NOT DETECTED NOT DETECTED Final   Streptococcus pyogenes NOT DETECTED NOT DETECTED Final   A.calcoaceticus-baumannii NOT DETECTED NOT DETECTED Final   Bacteroides fragilis NOT DETECTED NOT DETECTED Final   Enterobacterales NOT DETECTED NOT DETECTED Final   Enterobacter cloacae complex NOT DETECTED NOT DETECTED Final   Escherichia coli NOT DETECTED NOT DETECTED Final   Klebsiella aerogenes NOT DETECTED NOT DETECTED Final   Klebsiella oxytoca NOT DETECTED NOT DETECTED Final   Klebsiella pneumoniae NOT DETECTED NOT DETECTED Final   Proteus species NOT DETECTED NOT DETECTED Final   Salmonella species NOT DETECTED NOT DETECTED Final   Serratia marcescens NOT DETECTED NOT DETECTED Final   Haemophilus influenzae NOT DETECTED NOT DETECTED Final   Neisseria meningitidis NOT DETECTED NOT DETECTED Final   Pseudomonas aeruginosa NOT DETECTED NOT DETECTED Final   Stenotrophomonas maltophilia NOT DETECTED NOT DETECTED Final   Candida albicans NOT DETECTED NOT DETECTED Final   Candida auris NOT DETECTED NOT DETECTED Final   Candida glabrata NOT DETECTED NOT DETECTED Final   Candida krusei NOT DETECTED NOT DETECTED  Final   Candida parapsilosis NOT DETECTED NOT DETECTED Final   Candida tropicalis NOT DETECTED NOT DETECTED Final   Cryptococcus neoformans/gattii NOT DETECTED NOT DETECTED Final    Comment: Performed at [facility], [place], [place], [place] [address]  Urine culture     Status: None (Preliminary result)   Collection Time: 01/14/21  3:47 PM   Specimen: In/Out Cath Urine  Result Value Ref Range Status   Specimen Description   Final    IN/OUT CATH URINE Performed at [facility], [address] [place], [place] [address]    Special Requests   Final    NONE Performed at [facility], [address] [place], [place] [address]    Culture   Final    CULTURE REINCUBATED FOR BETTER GROWTH Performed at [facility], [place] [address], [place], [place] [address]    Report Status PENDING  Incomplete  Resp Panel by RT-PCR (Flu A&B, Covid) Nasopharyngeal Swab     Status: None   Collection Time: 01/14/21  3:47 PM   Specimen: Nasopharyngeal Swab; Nasopharyngeal(NP) swabs in vial transport medium  Result Value Ref Range Status   SARS Coronavirus 2 by RT PCR NEGATIVE NEGATIVE Final    Comment: (NOTE) SARS-CoV-2 target nucleic acids are NOT DETECTED.  The SARS-CoV-2 RNA is generally detectable in upper respiratory specimens during the acute  phase of infection. The lowest concentration of SARS-CoV-2 viral copies this assay can detect is 138 copies/mL. A negative result does not preclude SARS-Cov-2 infection and should not be used as the sole basis for treatment or other patient management decisions. A negative result may occur with  improper specimen collection/handling, submission of specimen other than nasopharyngeal swab, presence of viral mutation(s) within the areas targeted by this assay, and inadequate number of viral copies(<138 copies/mL). A negative result must be combined with clinical observations, patient history, and  epidemiological information. The expected result is Negative.  Fact Sheet for Patients:  EntrepreneurPulse.com.au  Fact Sheet for Healthcare Providers:  IncredibleEmployment.be  This test is no t yet approved or cleared by the [place] FDA and  has been authorized for detection and/or diagnosis of SARS-CoV-2 by FDA under an Emergency Use Authorization (EUA). This EUA will remain  in effect (meaning this test can be used) for the duration of the COVID-19 declaration under Section 564(b)(1) of the Act, 21 U.S.C.section 360bbb-3(b)(1), unless the authorization is terminated  or revoked sooner.       Influenza A by PCR NEGATIVE NEGATIVE Final   Influenza B by PCR NEGATIVE NEGATIVE Final    Comment: (NOTE) The Xpert Xpress SARS-CoV-2/FLU/RSV plus assay is intended as an aid in the diagnosis of influenza from Nasopharyngeal swab specimens and should not be used as a sole basis for treatment. Nasal washings and aspirates are unacceptable for Xpert Xpress SARS-CoV-2/FLU/RSV testing.  Fact Sheet for Patients: EntrepreneurPulse.com.au  Fact Sheet for Healthcare Providers: IncredibleEmployment.be  This test is not yet approved or cleared by the [place] FDA and has been authorized for detection and/or diagnosis of SARS-CoV-2 by FDA under an Emergency Use Authorization (EUA). This EUA will remain in effect (meaning this test can be used) for the duration of the COVID-19 declaration under Section 564(b)(1) of the Act, 21 U.S.C. section 360bbb-3(b)(1), unless the authorization is terminated or revoked.  Performed at [facility], [address] [place], [place] [address]   Blood Culture (routine x 2)     Status: Abnormal (Preliminary result)   Collection Time: 01/14/21  3:51 PM   Specimen: BLOOD LEFT ARM  Result Value Ref Range Status   Specimen Description   Final    BLOOD LEFT  ARM Performed at [facility], [address] [place], [place] [address]    Special Requests   Final    BOTTLES DRAWN AEROBIC AND ANAEROBIC Blood Culture adequate volume Performed at [facility], [address] [place], [place] [address]    Culture  Setup Time (A)  Final    GRAM VARIABLE ROD AEROBIC BOTTLE ONLY CRITICAL VALUE NOTED.  VALUE IS CONSISTENT WITH PREVIOUSLY REPORTED AND CALLED VALUE.    Culture   Final    CULTURE REINCUBATED FOR BETTER GROWTH Performed at [facility], [place] [address] [place], [place] [address]    Report Status PENDING  Incomplete  Respiratory (~20 pathogens) panel by PCR     Status: None   Collection Time: 01/14/21  7:22 PM   Specimen: Nasopharyngeal Swab; Respiratory  Result Value Ref Range Status   Adenovirus NOT DETECTED NOT DETECTED Final   Coronavirus 229E NOT DETECTED NOT DETECTED Final    Comment: (NOTE) The Coronavirus on the Respiratory Panel, DOES NOT test for the novel  Coronavirus (2019 nCoV)    Coronavirus HKU1 NOT DETECTED NOT DETECTED Final   Coronavirus NL63 NOT DETECTED  NOT DETECTED Final   Coronavirus OC43 NOT DETECTED NOT DETECTED Final   Metapneumovirus NOT DETECTED NOT DETECTED Final   Rhinovirus / Enterovirus NOT DETECTED NOT DETECTED Final   Influenza A NOT DETECTED NOT DETECTED Final   Influenza B NOT DETECTED NOT DETECTED Final   Parainfluenza Virus 1 NOT DETECTED NOT DETECTED Final   Parainfluenza Virus 2 NOT DETECTED NOT DETECTED Final   Parainfluenza Virus 3 NOT DETECTED NOT DETECTED Final   Parainfluenza Virus 4 NOT DETECTED NOT DETECTED Final   Respiratory Syncytial Virus NOT DETECTED NOT DETECTED Final   Bordetella pertussis NOT DETECTED NOT DETECTED Final   Bordetella Parapertussis NOT DETECTED NOT DETECTED Final   Chlamydophila pneumoniae NOT DETECTED NOT DETECTED Final   Mycoplasma pneumoniae NOT DETECTED NOT DETECTED Final    Comment: Performed at [facility], [place]  [address], [place], [place] [address]  MRSA PCR Screening     Status: None   Collection Time: 01/14/21 11:15 PM   Specimen: Nasopharyngeal  Result Value Ref Range Status   MRSA by PCR NEGATIVE NEGATIVE Final    Comment:        The GeneXpert MRSA Assay (FDA approved for NASAL specimens only), is one component of a comprehensive MRSA colonization surveillance program. It is not intended to diagnose MRSA infection nor to guide or monitor treatment for MRSA infections. Performed at [facility], [place] [address], [place], [place] [address]          Radiology Studies: CT HEAD WO CONTRAST  Result Date: 01/15/2021 CLINICAL DATA:  Headache, nausea, somnolent EXAM: CT HEAD WITHOUT CONTRAST TECHNIQUE: Contiguous axial images were obtained from the base of the skull through the vertex without intravenous contrast. COMPARISON:  08/20/2020 FINDINGS: Brain: No acute infarct or hemorrhage. Lateral ventricles and midline structures are unremarkable. No acute extra-axial fluid collections. No mass effect. Vascular: No hyperdense vessel or unexpected calcification. Skull: Normal. Negative for fracture or focal lesion. Sinuses/Orbits: Stable postsurgical changes of the bilateral maxillary sinuses and ethmoid air cells. Chronic mucosal thickening within the maxillary, ethmoid, and sphenoid sinuses. Other: None. IMPRESSION: 1. No acute intracranial process. 2. Chronic paranasal sinus disease as above. Electronically Signed   By: Randa Ngo M.D.   On: 01/15/2021 01:32   DG CHEST PORT 1 VIEW  Result Date: 01/14/2021 CLINICAL DATA:  Shortness of breath EXAM: PORTABLE CHEST 1 VIEW COMPARISON:  Film from earlier in the same day. FINDINGS: A cardiac shadow is stable. Left chest wall port is noted and stable. Postsurgical changes in the right lung are noted. Spinal stimulators are seen. Lungs are clear. IMPRESSION: No acute abnormality noted. Electronically Signed   By: Inez Catalina M.D.   On:  01/14/2021 22:28   DG Chest Port 1 View  Result Date: 01/14/2021 CLINICAL DATA:  Sepsis. EXAM: PORTABLE CHEST 1 VIEW COMPARISON:  Jan 11, 2021. FINDINGS: The heart size and mediastinal contours are within normal limits. Left subclavian Port-A-Cath is unchanged in position. No pneumothorax or pleural effusion is noted. Left lung is clear. Right infrahilar postoperative changes are again noted with associated scarring. The visualized skeletal structures are unremarkable. IMPRESSION: No definite acute abnormality Electronically Signed   By: Marijo Conception M.D.   On: 01/14/2021 15:39   ECHOCARDIOGRAM COMPLETE  Result Date: 01/15/2021    ECHOCARDIOGRAM REPORT   Patient Name:   Megan Brewer Date of Exam: 01/15/2021 Medical Rec #:  [id number]          Height:  63.0 in Accession #:    [id number]         Weight:       115.0 lb Date of Birth:  1946-06-01         BSA:          1.528 m Patient Age:    25 years           BP:           175/109 mmHg Patient Gender: F                  HR:           89 bpm. Exam Location:  Inpatient Procedure: 2D Echo, Cardiac Doppler and Color Doppler Indications:    Elevated Troponin  History:        Patient has no prior history of Echocardiogram examinations.                 Risk Factors:Sleep Apnea. Chills, fever, headache, nausea,                 abdominal pain, chest pain, dyspnea, and leg swelling. Acute                 metabolic encephalopathy likely from sepsis. Common variable                 immune deficiency. Thyroid disease. GERD. Chronic kidney                 disease.  Sonographer:    Darlina Sicilian RDCS Referring Phys: [phone] ANASTASSIA DOUTOVA  Sonographer Comments: Suboptimal parasternal window, suboptimal apical window and Technically difficult study due to poor echo windows. IMPRESSIONS  1. Non-traditional imaging windows limit the diagostic yield of this exam. Measurements and assessment of valvular heart disease are approximate values.  2. Left ventricular  ejection fraction, by estimation, is 55 to 60%. The left ventricle has normal function. Left ventricular endocardial border not optimally defined to evaluate regional wall motion. Left ventricular diastolic parameters are indeterminate.  3. Right ventricular systolic function is normal. The right ventricular size is normal. There is normal pulmonary artery systolic pressure. The estimated right ventricular systolic pressure is 70.3 mmHg.  4. The mitral valve is degenerative. Mild to moderate mitral valve regurgitation. Moderate mitral annular calcification.  5. Tricuspid valve regurgitation is mild to moderate.  6. The aortic valve is grossly normal. Aortic valve regurgitation is mild to moderate. No aortic stenosis by visual assessment of the valve, Doppler assessment suboptimal.  7. The inferior vena cava is normal in size with greater than 50% respiratory variability, suggesting right atrial pressure of 3 mmHg. FINDINGS  Left Ventricle: Left ventricular ejection fraction, by estimation, is 55 to 60%. The left ventricle has normal function. Left ventricular endocardial border not optimally defined to evaluate regional wall motion. The left ventricular internal cavity size was normal in size. There is no left ventricular hypertrophy. Left ventricular diastolic parameters are indeterminate. Right Ventricle: The right ventricular size is normal. No increase in right ventricular wall thickness. Right ventricular systolic function is normal. There is normal pulmonary artery systolic pressure. The tricuspid regurgitant velocity is 2.84 m/s, and  with an assumed right atrial pressure of 3 mmHg, the estimated right ventricular systolic pressure is 50.0 mmHg. Left Atrium: Left atrial size was normal in size. Right Atrium: Right atrial size was normal in size. Pericardium: There is no evidence of pericardial effusion. Mitral Valve: The mitral valve is degenerative in appearance.  Moderate mitral annular calcification. Mild  to moderate mitral valve regurgitation. Tricuspid Valve: The tricuspid valve is grossly normal. Tricuspid valve regurgitation is mild to moderate. Aortic Valve: The aortic valve is grossly normal. Aortic valve regurgitation is mild to moderate. No aortic stenosis is present. Pulmonic Valve: The pulmonic valve was not well visualized. Pulmonic valve regurgitation is trivial. No evidence of pulmonic stenosis. Aorta: The aortic root was not well visualized. Venous: The inferior vena cava is normal in size with greater than 50% respiratory variability, suggesting right atrial pressure of 3 mmHg. IAS/Shunts: No atrial level shunt detected by color flow Doppler.  LEFT VENTRICLE PLAX 2D LVOT diam:     1.80 cm  Diastology LV SV:         43       LV e' medial:    6.64 cm/s LV SV Index:   28       LV E/e' medial:  12.5 LVOT Area:     2.54 cm LV e' lateral:   6.64 cm/s                         LV E/e' lateral: 12.5  RIGHT VENTRICLE RV S prime:     12.10 cm/s TAPSE (M-mode): 2.2 cm LEFT ATRIUM             Index       RIGHT ATRIUM          Index LA Vol (A2C):   31.8 ml 20.81 ml/m RA Area:     8.13 cm LA Vol (A4C):   34.9 ml 22.83 ml/m RA Volume:   13.80 ml 9.03 ml/m LA Biplane Vol: 33.8 ml 22.11 ml/m  AORTIC VALVE LVOT Vmax:   94.90 cm/s LVOT Vmean:  62.400 cm/s LVOT VTI:    0.170 m  AORTA Ao Root diam: 2.60 cm MITRAL VALVE                TRICUSPID VALVE MV Area (PHT): 3.42 cm     TR Peak grad:   32.3 mmHg MV Decel Time: 222 msec     TR Vmax:        284.00 cm/s MV E velocity: 83.20 cm/s MV A velocity: 110.00 cm/s  SHUNTS MV E/A ratio:  0.76         Systemic VTI:  0.17 m                             Systemic Diam: 1.80 cm Cherlynn Kaiser MD Electronically signed by Cherlynn Kaiser MD Signature Date/Time: 01/15/2021/9:57:32 AM    Final    CT Renal Stone Study  Result Date: 01/14/2021 CLINICAL DATA:  Abdomen pain hematuria EXAM: CT ABDOMEN AND PELVIS WITHOUT CONTRAST TECHNIQUE: Multidetector CT imaging of the abdomen and  pelvis was performed following the standard protocol without IV contrast. COMPARISON:  CT 12/09/2020 FINDINGS: Lower chest: Lung bases demonstrate chronic right pleural thickening with calcifications. Slight rightward deviation of distal esophagus which contains radiopaque material. Hepatobiliary: Status post cholecystectomy. Stable intra and extrahepatic biliary dilatation. No focal hepatic abnormality. Pancreas: Unremarkable. No pancreatic ductal dilatation or surrounding inflammatory changes. Spleen: Normal in size without focal abnormality. Adrenals/Urinary Tract: Thickened adrenal glands without dominant mass. Kidneys show no hydronephrosis or ureteral stone. Distended urinary bladder Stomach/Bowel: Status post gastric bypass. No obstructive changes. No acute bowel wall thickening. Vascular/Lymphatic: Moderate aortic atherosclerosis. No aneurysm. No suspicious nodes Reproductive: Status post  hysterectomy. No adnexal masses. Other: Negative for pelvic effusion or free air. Musculoskeletal: No acute osseous abnormality. Left-sided sacral stimulator. Right posterior ascending thoracic stimulator with lead entering the canal at approximate T10 level. IMPRESSION: 1. No CT evidence for acute intra-abdominal or pelvic abnormality. 2. Chronic right pleural thickening with calcifications 3. Status post gastric bypass without adverse features at this time. Electronically Signed   By: Donavan Foil M.D.   On: 01/14/2021 20:05   VAS [place] LOWER EXTREMITY VENOUS (DVT)  Result Date: 01/15/2021  Lower Venous DVT Study Patient Name:  LAYNA ROEPER  Date of Exam:   01/15/2021 Medical Rec #: [id number]           Accession #:    [id number] Date of Birth: Aug 27, 1945          Patient Gender: F Patient Age:   74Y Exam Location:  [facility] Procedure:      VAS [place] LOWER EXTREMITY VENOUS (DVT) Referring Phys: [place] --------------------------------------------------------------------------------   Indications: Edema.  Risk Factors: None identified. Comparison Study: 01/11/2021 - Negative for DVT on the right. Performing Technologist: Oliver Hum RVT  Examination Guidelines: A complete evaluation includes B-mode imaging, spectral Doppler, color Doppler, and power Doppler as needed of all accessible portions of each vessel. Bilateral testing is considered an integral part of a complete examination. Limited examinations for reoccurring indications may be performed as noted. The reflux portion of the exam is performed with the patient in reverse Trendelenburg.  +-----+---------------+---------+-----------+----------+--------------+ RIGHTCompressibilityPhasicitySpontaneityPropertiesThrombus Aging +-----+---------------+---------+-----------+----------+--------------+ CFV  Full           Yes      Yes                                 +-----+---------------+---------+-----------+----------+--------------+   +---------+---------------+---------+-----------+----------+--------------+ LEFT     CompressibilityPhasicitySpontaneityPropertiesThrombus Aging +---------+---------------+---------+-----------+----------+--------------+ CFV      Full           Yes      Yes                                 +---------+---------------+---------+-----------+----------+--------------+ SFJ      Full                                                        +---------+---------------+---------+-----------+----------+--------------+ FV Prox  Full                                                        +---------+---------------+---------+-----------+----------+--------------+ FV Mid   Full                                                        +---------+---------------+---------+-----------+----------+--------------+ FV DistalFull                                                        +---------+---------------+---------+-----------+----------+--------------+  PFV      Full                                                         +---------+---------------+---------+-----------+----------+--------------+ POP      Full           Yes      Yes                                 +---------+---------------+---------+-----------+----------+--------------+ PTV      Full                                                        +---------+---------------+---------+-----------+----------+--------------+ PERO     Full                                                        +---------+---------------+---------+-----------+----------+--------------+     Summary: RIGHT: - No evidence of common femoral vein obstruction.  LEFT: - There is no evidence of deep vein thrombosis in the lower extremity.  - No cystic structure found in the popliteal fossa.  *See table(s) above for measurements and observations. Electronically signed by Harold Barban MD on 01/15/2021 at 10:18:34 PM.    Final         Scheduled Meds: . amLODipine  5 mg Oral Daily  . Chlorhexidine Gluconate Cloth  6 each Topical Daily  . heparin injection (subcutaneous)  5,000 Units Subcutaneous Q8H  . levothyroxine  75 mcg Oral Q0600  . mouth rinse  15 mL Mouth Rinse BID  . pantoprazole  40 mg Oral Daily  . sodium chloride flush  10-40 mL Intracatheter Q12H  . vancomycin  125 mg Oral QID   Continuous Infusions: . sodium chloride 100 mL/hr at 01/16/21 0116  . piperacillin-tazobactam (ZOSYN)  IV 3.375 g (01/16/21 0505)     LOS: 0 days    Time spent:35 mins. More than 50% of that time was spent in counseling and/or coordination of care.      Shelly Coss, MD [facility] P6/09/2020, 7:39 AM

## 2021-01-17 ENCOUNTER — Inpatient Hospital Stay (HOSPITAL_COMMUNITY): Payer: Medicare PPO

## 2021-01-17 DIAGNOSIS — I447 Left bundle-branch block, unspecified: Secondary | ICD-10-CM | POA: Diagnosis not present

## 2021-01-17 DIAGNOSIS — G894 Chronic pain syndrome: Secondary | ICD-10-CM | POA: Diagnosis not present

## 2021-01-17 DIAGNOSIS — G2581 Restless legs syndrome: Secondary | ICD-10-CM | POA: Diagnosis not present

## 2021-01-17 DIAGNOSIS — G4733 Obstructive sleep apnea (adult) (pediatric): Secondary | ICD-10-CM | POA: Diagnosis not present

## 2021-01-17 DIAGNOSIS — A0472 Enterocolitis due to Clostridium difficile, not specified as recurrent: Secondary | ICD-10-CM

## 2021-01-17 DIAGNOSIS — N390 Urinary tract infection, site not specified: Secondary | ICD-10-CM

## 2021-01-17 LAB — BASIC METABOLIC PANEL
Anion gap: 6 (ref 5–15)
BUN: 16 mg/dL (ref 8–23)
CO2: 20 mmol/L — ABNORMAL LOW (ref 22–32)
Calcium: 7.6 mg/dL — ABNORMAL LOW (ref 8.9–10.3)
Chloride: 112 mmol/L — ABNORMAL HIGH (ref 98–111)
Creatinine, Ser: 0.78 mg/dL (ref 0.44–1.00)
GFR, Estimated: >60 mL/min (ref >60)
Glucose, Bld: 85 mg/dL (ref 70–99)
Potassium: 2.9 mmol/L — ABNORMAL LOW (ref 3.5–5.1)
Sodium: 138 mmol/L (ref 135–145)

## 2021-01-17 LAB — BRAIN NATRIURETIC PEPTIDE: B Natriuretic Peptide: 44.8 pg/mL (ref 0.0–100.0)

## 2021-01-17 LAB — CBC
HCT: 32.7 % — ABNORMAL LOW (ref 36.0–46.0)
Hemoglobin: 11.1 g/dL — ABNORMAL LOW (ref 12.0–15.0)
MCH: 30.3 pg (ref 26.0–34.0)
MCHC: 33.9 g/dL (ref 30.0–36.0)
MCV: 89.3 fL (ref 80.0–100.0)
Platelets: 124 10*3/uL — ABNORMAL LOW (ref 150–400)
RBC: 3.66 MIL/uL — ABNORMAL LOW (ref 3.87–5.11)
RDW: 13.2 % (ref 11.5–15.5)
WBC: 3.1 10*3/uL — ABNORMAL LOW (ref 4.0–10.5)
nRBC: 0 % (ref 0.0–0.2)

## 2021-01-17 LAB — URINE CULTURE: Culture: >=100000 — AB

## 2021-01-17 MED ORDER — MIRTAZAPINE 15 MG PO TABS: 15.0000 mg | ORAL_TABLET | Freq: Every day | ORAL | Status: DC

## 2021-01-17 MED ORDER — OXYBUTYNIN CHLORIDE 5 MG/5ML PO SYRP
2.5000 mg | ORAL_SOLUTION | Freq: Two times a day (BID) | ORAL | Status: DC
  Administered 2021-01-17 – 2021-01-22 (×10): 2.5 mg via ORAL
  Filled 2021-01-17 (×12): qty 2.5

## 2021-01-17 MED ORDER — POTASSIUM CHLORIDE CRYS ER 20 MEQ PO TBCR
40.0000 meq | EXTENDED_RELEASE_TABLET | ORAL | Status: AC
  Administered 2021-01-17 (×2): 40 meq via ORAL
  Filled 2021-01-17 (×2): qty 2

## 2021-01-17 MED ORDER — MIRTAZAPINE 15 MG PO TABS
15.0000 mg | ORAL_TABLET | Freq: Every day | ORAL | Status: DC
  Administered 2021-01-17 – 2021-01-21 (×5): 15 mg via ORAL
  Filled 2021-01-17 (×5): qty 1

## 2021-01-17 MED ORDER — SODIUM CHLORIDE 0.9 % IV SOLN: INTRAVENOUS | Status: DC

## 2021-01-17 MED ORDER — VANCOMYCIN HCL 125 MG PO CAPS
125.0000 mg | ORAL_CAPSULE | Freq: Four times a day (QID) | ORAL | Status: DC
  Administered 2021-01-17 – 2021-01-22 (×21): 125 mg via ORAL
  Filled 2021-01-17 (×23): qty 1

## 2021-01-17 MED ORDER — MIRTAZAPINE 15 MG PO TABS: 30.0000 mg | ORAL_TABLET | Freq: Every day | ORAL | Status: DC

## 2021-01-17 MED ORDER — SODIUM CHLORIDE 0.9 % IV SOLN
2.0000 g | Freq: Two times a day (BID) | INTRAVENOUS | Status: DC
  Administered 2021-01-17 – 2021-01-22 (×11): 2 g via INTRAVENOUS
  Filled 2021-01-17 (×12): qty 2

## 2021-01-17 MED ORDER — LAMOTRIGINE 100 MG PO TABS
200.0000 mg | ORAL_TABLET | Freq: Every day | ORAL | Status: DC
  Administered 2021-01-17 – 2021-01-21 (×5): 200 mg via ORAL
  Filled 2021-01-17 (×7): qty 2

## 2021-01-17 NOTE — Progress Notes (Signed)
[facility]   Reason for visit: Follow up on bacteremia  Interval History: blood culture results still pending and in discussion with micro, is a non-fermenting oxidase positive GNR and will be sent out for ID confirmation.   Day 4 total antibiotics  Physical Exam: Constitutional:  Vitals:   01/17/21 1141 01/17/21 1200  BP:  (!) 128/47  Pulse:  89  Resp:  12  Temp: 98 F (36.7 C) 98 F (36.7 C)  SpO2:  100%   patient appears in NAD Chest: + port-a-cath with no surrounding erythema Respiratory: Normal respiratory effort; CTA B Cardiovascular: RRR GI: soft, nt, nd  Review of Systems: Constitutional: negative for fevers and chills Gastrointestinal: positive for diarrhea Integument/breast: negative for rash  Lab Results  Component Value Date   WBC 3.1 (L) 01/17/2021   HGB 11.1 (L) 01/17/2021   HCT 32.7 (L) 01/17/2021   MCV 89.3 01/17/2021   PLT 124 (L) 01/17/2021    Lab Results  Component Value Date   CREATININE 0.78 01/17/2021   BUN 16 01/17/2021   NA 138 01/17/2021   K 2.9 (L) 01/17/2021   CL 112 (H) 01/17/2021   CO2 20 (L) 01/17/2021    Lab Results  Component Value Date   ALT 29 01/15/2021   AST 33 01/15/2021   ALKPHOS 56 01/15/2021     Microbiology: Recent Results (from the past 240 hour(s))  Clostridium Difficile by PCR     Status: Abnormal   Collection Time: 01/14/21 10:00 AM   Specimen: STOOL   Stool  Result Value Ref Range Status   Toxigenic C. Difficile by PCR Positive (A) Negative Final    Comment: Toxigenic C difficile: Positive Epidemic Strain Bl/NAP1/027: Presumptive Negative   Blood Culture (routine x 2)     Status: Abnormal (Preliminary result)   Collection Time: 01/14/21  3:46 PM   Specimen: BLOOD  Result Value Ref Range Status   Specimen Description   Final    BLOOD LEFT CHEST PORTA CATH Performed at [facility], [address] [place], [place] [address]    Special Requests   Final     BOTTLES DRAWN AEROBIC AND ANAEROBIC Blood Culture adequate volume Performed at [facility], [address] [place], [place] [address]    Culture  Setup Time (A)  Final    GRAM VARIABLE ROD AEROBIC BOTTLE ONLY CRITICAL RESULT CALLED TO, READ BACK BY AND VERIFIED WITH: PHRMD J LEGGE [id number] AT 1059 AM BY CM    Culture (A)  Final    GRAM VARIABLE ROD IDENTIFICATION AND SUSCEPTIBILITIES TO FOLLOW Performed at [facility], [address], [place], [place] [address]    Report Status PENDING  Incomplete  Blood Culture ID Panel (Reflexed)     Status: None   Collection Time: 01/14/21  3:46 PM  Result Value Ref Range Status   Enterococcus faecalis NOT DETECTED NOT DETECTED Final   Enterococcus Faecium NOT DETECTED NOT DETECTED Final   Listeria monocytogenes NOT DETECTED NOT DETECTED Final   Staphylococcus species NOT DETECTED NOT DETECTED Final   Staphylococcus aureus (BCID) NOT DETECTED NOT DETECTED Final   Staphylococcus epidermidis NOT DETECTED NOT DETECTED Final   Staphylococcus lugdunensis NOT DETECTED NOT DETECTED Final   Streptococcus species NOT DETECTED NOT DETECTED Final   Streptococcus agalactiae NOT DETECTED NOT DETECTED Final   Streptococcus pneumoniae NOT DETECTED NOT DETECTED Final   Streptococcus pyogenes NOT DETECTED NOT DETECTED Final  A.calcoaceticus-baumannii NOT DETECTED NOT DETECTED Final   Bacteroides fragilis NOT DETECTED NOT DETECTED Final   Enterobacterales NOT DETECTED NOT DETECTED Final   Enterobacter cloacae complex NOT DETECTED NOT DETECTED Final   Escherichia coli NOT DETECTED NOT DETECTED Final   Klebsiella aerogenes NOT DETECTED NOT DETECTED Final   Klebsiella oxytoca NOT DETECTED NOT DETECTED Final   Klebsiella pneumoniae NOT DETECTED NOT DETECTED Final   Proteus species NOT DETECTED NOT DETECTED Final   Salmonella species NOT DETECTED NOT DETECTED Final   Serratia marcescens NOT DETECTED NOT DETECTED Final   Haemophilus influenzae  NOT DETECTED NOT DETECTED Final   Neisseria meningitidis NOT DETECTED NOT DETECTED Final   Pseudomonas aeruginosa NOT DETECTED NOT DETECTED Final   Stenotrophomonas maltophilia NOT DETECTED NOT DETECTED Final   Candida albicans NOT DETECTED NOT DETECTED Final   Candida auris NOT DETECTED NOT DETECTED Final   Candida glabrata NOT DETECTED NOT DETECTED Final   Candida krusei NOT DETECTED NOT DETECTED Final   Candida parapsilosis NOT DETECTED NOT DETECTED Final   Candida tropicalis NOT DETECTED NOT DETECTED Final   Cryptococcus neoformans/gattii NOT DETECTED NOT DETECTED Final    Comment: Performed at [facility], [place], [place], [place] [address]  Urine culture     Status: Abnormal   Collection Time: 01/14/21  3:47 PM   Specimen: In/Out Cath Urine  Result Value Ref Range Status   Specimen Description   Final    IN/OUT CATH URINE Performed at [facility], [address] [place], [place] [address]    Special Requests   Final    NONE Performed at [facility], [address] [place], [place] [address]    Culture >=100,000 COLONIES/mL ENTEROCOCCUS FAECALIS (A)  Final   Report Status 01/17/2021 FINAL  Final   Organism ID, Bacteria ENTEROCOCCUS FAECALIS (A)  Final      Susceptibility   Enterococcus faecalis - MIC*    AMPICILLIN <=2 SENSITIVE Sensitive     NITROFURANTOIN <=16 SENSITIVE Sensitive     VANCOMYCIN 1 SENSITIVE Sensitive     * >=100,000 COLONIES/mL ENTEROCOCCUS FAECALIS  Resp Panel by RT-PCR (Flu A&B, Covid) Nasopharyngeal Swab     Status: None   Collection Time: 01/14/21  3:47 PM   Specimen: Nasopharyngeal Swab; Nasopharyngeal(NP) swabs in vial transport medium  Result Value Ref Range Status   SARS Coronavirus 2 by RT PCR NEGATIVE NEGATIVE Final    Comment: (NOTE) SARS-CoV-2 target nucleic acids are NOT DETECTED.  The SARS-CoV-2 RNA is generally detectable in upper respiratory specimens during the acute phase of infection. The  lowest concentration of SARS-CoV-2 viral copies this assay can detect is 138 copies/mL. A negative result does not preclude SARS-Cov-2 infection and should not be used as the sole basis for treatment or other patient management decisions. A negative result may occur with  improper specimen collection/handling, submission of specimen other than nasopharyngeal swab, presence of viral mutation(s) within the areas targeted by this assay, and inadequate number of viral copies(<138 copies/mL). A negative result must be combined with clinical observations, patient history, and epidemiological information. The expected result is Negative.  Fact Sheet for Patients:  EntrepreneurPulse.com.au  Fact Sheet for Healthcare Providers:  IncredibleEmployment.be  This test is no t yet approved or cleared by the [place] FDA and  has been authorized for detection and/or diagnosis of SARS-CoV-2 by FDA under an Emergency Use Authorization (EUA). This EUA will remain  in effect (meaning this  test can be used) for the duration of the COVID-19 declaration under Section 564(b)(1) of the Act, 21 U.S.C.section 360bbb-3(b)(1), unless the authorization is terminated  or revoked sooner.       Influenza A by PCR NEGATIVE NEGATIVE Final   Influenza B by PCR NEGATIVE NEGATIVE Final    Comment: (NOTE) The Xpert Xpress SARS-CoV-2/FLU/RSV plus assay is intended as an aid in the diagnosis of influenza from Nasopharyngeal swab specimens and should not be used as a sole basis for treatment. Nasal washings and aspirates are unacceptable for Xpert Xpress SARS-CoV-2/FLU/RSV testing.  Fact Sheet for Patients: BloggerCourse.com  Fact Sheet for Healthcare Providers: SeriousBroker.it  This test is not yet approved or cleared by the [place] FDA and has been authorized for detection and/or diagnosis of SARS-CoV-2 by FDA under  an Emergency Use Authorization (EUA). This EUA will remain in effect (meaning this test can be used) for the duration of the COVID-19 declaration under Section 564(b)(1) of the Act, 21 U.S.C. section 360bbb-3(b)(1), unless the authorization is terminated or revoked.  Performed at [facility], [address] [place], [place] [address]   Blood Culture (routine x 2)     Status: Abnormal (Preliminary result)   Collection Time: 01/14/21  3:51 PM   Specimen: BLOOD LEFT ARM  Result Value Ref Range Status   Specimen Description   Final    BLOOD LEFT ARM Performed at [facility], [address] [place], [place] [address]    Special Requests   Final    BOTTLES DRAWN AEROBIC AND ANAEROBIC Blood Culture adequate volume Performed at [facility], [address] [place], [place] [address]    Culture  Setup Time (A)  Final    GRAM VARIABLE ROD AEROBIC BOTTLE ONLY CRITICAL VALUE NOTED.  VALUE IS CONSISTENT WITH PREVIOUSLY REPORTED AND CALLED VALUE.    Culture (A)  Final    GRAM VARIABLE ROD IDENTIFICATION TO FOLLOW Performed at [facility], [address] [place], [place] [address]    Report Status PENDING  Incomplete  Respiratory (~20 pathogens) panel by PCR     Status: None   Collection Time: 01/14/21  7:22 PM   Specimen: Nasopharyngeal Swab; Respiratory  Result Value Ref Range Status   Adenovirus NOT DETECTED NOT DETECTED Final   Coronavirus 229E NOT DETECTED NOT DETECTED Final    Comment: (NOTE) The Coronavirus on the Respiratory Panel, DOES NOT test for the novel  Coronavirus (2019 nCoV)    Coronavirus HKU1 NOT DETECTED NOT DETECTED Final   Coronavirus NL63 NOT DETECTED NOT DETECTED Final   Coronavirus OC43 NOT DETECTED NOT DETECTED Final   Metapneumovirus NOT DETECTED NOT DETECTED Final   Rhinovirus / Enterovirus NOT DETECTED NOT DETECTED Final   Influenza A NOT DETECTED NOT DETECTED Final   Influenza B NOT DETECTED NOT DETECTED  Final   Parainfluenza Virus 1 NOT DETECTED NOT DETECTED Final   Parainfluenza Virus 2 NOT DETECTED NOT DETECTED Final   Parainfluenza Virus 3 NOT DETECTED NOT DETECTED Final   Parainfluenza Virus 4 NOT DETECTED NOT DETECTED Final   Respiratory Syncytial Virus NOT DETECTED NOT DETECTED Final   Bordetella pertussis NOT DETECTED NOT DETECTED Final   Bordetella Parapertussis NOT DETECTED NOT DETECTED Final   Chlamydophila pneumoniae NOT DETECTED NOT DETECTED Final   Mycoplasma pneumoniae NOT DETECTED NOT DETECTED Final    Comment: Performed at [facility], [address] [place], [place] [address]  MRSA PCR Screening     Status: None   Collection Time: 01/14/21 11:15 PM   Specimen: Nasopharyngeal  Result Value Ref Range Status   MRSA by PCR NEGATIVE NEGATIVE Final    Comment:        The GeneXpert MRSA Assay (FDA approved for NASAL specimens only), is one component of a comprehensive MRSA colonization surveillance program. It is not intended to diagnose MRSA infection nor to guide or monitor treatment for MRSA infections. Performed at [facility], [place] [address] [place], [place] [address]   Culture, blood (routine x 2)     Status: None (Preliminary result)   Collection Time: 01/16/21  9:43 AM   Specimen: BLOOD  Result Value Ref Range Status   Specimen Description   Final    BLOOD RIGHT ANTECUBITAL Performed at [facility] [address], [place], [place] [address]    Special Requests   Final    BOTTLES DRAWN AEROBIC AND ANAEROBIC Blood Culture adequate volume Performed at [facility] [address] [place], [place] [address]    Culture   Final    NO GROWTH < 24 HOURS Performed at [facility] [address], [place], [place] [address]    Report Status PENDING  Incomplete  Culture, blood (routine x 2)     Status: None (Preliminary result)   Collection Time: 01/16/21  9:51 AM   Specimen: BLOOD   Result Value Ref Range Status   Specimen Description   Final    BLOOD LEFT ANTECUBITAL Performed at [facility] [address], [place], [place] [address]    Special Requests   Final    BOTTLES DRAWN AEROBIC ONLY Blood Culture adequate volume Performed at [facility] [address] [place], [place] [address]    Culture   Final    NO GROWTH < 24 HOURS Performed at [facility] [address], [place], [place] [address]    Report Status PENDING  Incomplete    Impression/Plan:  1. Bacteremia - unusual organism and being sent out for confirmation. Non-fermenting, oxydase positive, GNR.  Likely sensitive to cefepime and will change to this.  I recommend 2 weeks total of treatment through 01/29/21 with cefepime due to having a port-a-cath.  No absolute indication for port removal.  We will continue to monitor results though probably not back until next week.  [place] for discharge from [place] standpoint when stable.Marland Kitchen   She will follow up with [place] next week  2.  Diarrhea - pcr positive but no other positive C diff tests.  Have restarted the oral vancomycin for possible C diff.  Treat for 10 days total.    Diagnosis: Bacteremia with portath in place  Culture Result: gram variable rod, sent out for further identification  Allergies  Allergen Reactions  . Tetracycline Nausea Only    Causes ringing in ears, dizziness, migraine, nausea  . Corticosteroids Other (See Comments)    12/02/2016 interview by Melissa Montane: Oral dosing causes migraines/nausea/tinnitus. Prev tolerated IV and intranasal admin w/o difficulty.  . Cefixime Other (See Comments)    Headache   . Ciprofloxacin Other (See Comments)    Headache, dizziness, ringing in the ears  . Doxycycline Other (See Comments)    unknown  . Gabapentin     Throat swells/closes  . Isometheptene-Dichloral-Apap Other (See Comments)    unknown  . Ketorolac     12/02/2016 interview by Melissa Montane: Oral dosing  prednisone/corticosteroids causes migraines/nausea/tinnitus. Prev tolerated IV and intranasal admin w/o difficulty.  . Prednisone Other (See Comments)    Migraine, dizzy, ringing in ears-can take it IV 12/02/2016 interview by [name]: Oral prednisone dosing causes migraines/nausea/tinnitus. Prev tolerated IV and intranasal admin w/o difficulty.  . Promethazine Other (See Comments)    causes severe abdominal pain when taken IV; however she can take PO or IM  . Stadol [Butorphanol]     Cerebral pain  . Erythromycin Base Diarrhea and Itching    Severe diarrhea  . Propoxyphene Nausea Only    OPAT Orders Discharge antibiotics to be given via PICC line Discharge antibiotics: cefepime Per pharmacy protocol yes Duration: 2 weeks total  End Date: 01/29/21  [facility] Care Per Protocol:  Home health RN for IV administration and teaching; PICC line care and labs.    Labs weekly while on IV antibiotics: __x CBC with differential __ BMP __x CMP __ CRP __ ESR __ Vancomycin trough __ CK  __ Please pull PIC at completion of IV antibiotics N/A __ Please leave PIC in place until doctor has seen patient or been notified  Fax weekly labs to [phone]  Clinic Follow Up Appt: 01/24/21 with Dr. [place] Bali at 3:15

## 2021-01-17 NOTE — Progress Notes (Signed)
Physical Therapy Treatment Patient Details Name: Megan Brewer MRN: [id number] DOB: 09/22/1945 Today's Date: 01/17/2021    History of Present Illness Patient is a 75 year old female generalized fatigue chills tachycardia febrile. PMH includes lung cancer sp ressection, immunoglobulin deficiency (PTA Gammagard infusions), recurrent UTI  Sp Gastric bipass 8-10 y ago    PT Comments    Pt very motivated and eager to progress.  Pt up to ambulate increased distances in hall and performed LE therex program with cues only. Pt hopeful for advancement from ICU today.   Follow Up Recommendations  Home health PT     Equipment Recommendations  None recommended by PT    Recommendations for Other Services       Precautions / Restrictions Precautions Precautions: Fall Precaution Comments: Droplet/Contact Restrictions Weight Bearing Restrictions: No    Mobility  Bed Mobility               General bed mobility comments: up in chair and requests back to same Patient Response: Cooperative  Transfers Overall transfer level: Needs assistance Equipment used: Rolling walker (2 wheeled) Transfers: Sit to/from Stand Sit to Stand: Min guard         General transfer comment: steady assist with cues for LE management and use of UEs to self assist  Ambulation/Gait Ambulation/Gait assistance: Min assist;Min guard Gait Distance (Feet): 180 Feet Assistive device: Rolling walker (2 wheeled) Gait Pattern/deviations: Step-to pattern;Step-through pattern;Decreased step length - right;Decreased step length - left;Shuffle;Trunk flexed     General Gait Details: cues for posture and position from [employer]             Wheelchair Mobility    Modified Rankin (Stroke Patients Only)       Balance Overall balance assessment: Needs assistance Sitting-balance support: Feet supported;No upper extremity supported Sitting balance-Leahy Scale: Good     Standing balance support:  No upper extremity supported Standing balance-Leahy Scale: Fair                              Cognition Arousal/Alertness: Awake/alert Behavior During Therapy: WFL for tasks assessed/performed Overall Cognitive Status: Within Functional Limits for tasks assessed                                        Exercises General Exercises - Lower Extremity Ankle Circles/Pumps: AROM;Both;15 reps;Supine Quad Sets: AROM;Both;10 reps;Supine Long Arc Quad: AROM;Both;15 reps;Seated Hip ABduction/ADduction: AROM;Both;10 reps;Supine Hip Flexion/Marching: AROM;Both;15 reps;Seated    General Comments        Pertinent Vitals/Pain Pain Assessment: No/denies pain    Home Living                      Prior Function            PT Goals (current goals can now be found in the care plan section) Acute Rehab PT Goals Patient Stated Goal: home to husband PT Goal Formulation: With patient Time For Goal Achievement: 01/29/21 Potential to Achieve Goals: Good Progress towards PT goals: Progressing toward goals    Frequency    Min 3X/week      PT Plan Current plan remains appropriate    Co-evaluation              AM-PAC PT "6 Clicks" Mobility   Outcome Measure  Help needed turning  from your back to your side while in a flat bed without using bedrails?: None Help needed moving from lying on your back to sitting on the side of a flat bed without using bedrails?: None Help needed moving to and from a bed to a chair (including a wheelchair)?: A Little Help needed standing up from a chair using your arms (e.g., wheelchair or bedside chair)?: A Little Help needed to walk in hospital room?: A Little Help needed climbing 3-5 steps with a railing? : A Lot 6 Click Score: 19    End of Session Equipment Utilized During Treatment: Gait belt Activity Tolerance: Patient tolerated treatment well Patient left: in chair;with call bell/phone within reach;with chair  alarm set Nurse Communication: Mobility status PT Visit Diagnosis: Difficulty in walking, not elsewhere classified (R26.2)     Time: 1021-1050 PT Time Calculation (min) (ACUTE ONLY): 29 min  Charges:  $Gait Training: 8-22 mins $Therapeutic Exercise: 8-22 mins                     [place] Pager [phone] Office [phone]    Dennis Killilea 01/17/2021, 12:51 PM

## 2021-01-17 NOTE — Progress Notes (Signed)
Upon chart review, patient had positive C. diff PCR on 01/14/2021. Patient will need to be on enteric isolation following C. diff management plan protocol. Discussed over the phone with Nelva Bush, RN.

## 2021-01-17 NOTE — Progress Notes (Signed)
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  COVID screen test was negative.  On presentation patient was febrile, tachycardic.  Lab work showed hypokalemia, elevated troponin.  Patient was confused on admission.  Patient was admitted for the management of severe sepsis.ID following  Assessment & Plan:   Active Problems:   OBSTRUCTIVE SLEEP APNEA   Chronic pain syndrome   Hypogammaglobulinemia (HCC)   Left bundle branch block   Restless legs syndrome   Chronic kidney disease   Hypothyroidism   SIRS (systemic inflammatory response syndrome) (HCC)   Elevated troponin   Hypokalemia   Headache   Acute metabolic encephalopathy   Severe sepsis: Presented with fever with evidence of end organ damage, confusion, troponin elevation.  Elevated procalcitonin .started on IV antibiotics, IV fluids.    Patient has history of immunoglobulin deficiency. Chest x-ray, CT renal did not show any acute findings.  No clear source of sepsis at present.  Venous Doppler negative for DVT.  She has a port on the left chest.  Blood culture in the aerobic bottle now showing gram variable rod.  Currently on Zosyn.  Repeat blood cultures have been sent,NGTD  C. difficile diarrhea,PCR positive: She was seen by GI as an outpatient few days ago for diarrhea and her C. difficile PCR was positive on 01/14/21.  Started on vancomycin here but since she has relapse of diarrhoea .  Does not complain of abdominal pain.  Acute metabolic encephalopathy: Most likely secondary to sepsis versus polypharmacy.  Much improved, currently alert and oriented. she was on multiple psychoactive  medications at home which includes baclofen, Suboxone, Vistaril, Lamictal, Remeron, Requip, Zanaflex.Some of  these are on hold.  CT head did not show any acute intracranial  abnormality.  Headache/back pain: Has history of migraines, has history of chronic back pain.  Continue supportive care.  Minimize sedatives.    No signs of CNS infection.  On sumatriptan  UTI: Denies any dysuria but urine cultures showing significant Enterococcus faecalis.  Currently on Zosyn.  Elevated troponin: Most likely secondary to supply demand ischemia from type II MI.  Echocardiogram showed ejection fraction of 55 to 60%, indeterminate left ventricular diastolic parameters.  Cardiology consulted.  No plan for intervention. denies any chest pain.  Started on heparin by the admitting physician ,now discontinued   Has history of left bundle branch block.  EKG did not show signs of any ischemic changes  History of hypogammaglobinemia: History of common variable immune deficiency.  Followed by Dr. London Pepper with hematology at [facility] every 3 weeks as an outpatient.  GERD/history of gastric bypass: Continue Protonix  Leucopenia/Thrombocytopenia:   Continue to monitor  Hypothyroidism: Continue Synthyroid  Hypertension: Blood pressure stable  on amlodipine 5 mg daily.  Urine retention:Started on Flomax  Generalized weakness/debility/deconditioning: Home health recommended after  PT/OT evaluation.           DVT prophylaxis:SCD Code Status: Full Family Communication:: Discussed with the husband on phone on 01/17/21 Status is: Observation  The patient remains OBS appropriate and will d/c before 2 midnights.  Dispo: The patient is from: Home  Anticipated d/c is to: Home              Patient currently is not medically stable to d/c.   Difficult to place patient No     Consultants: PCCM, cardiology  Procedures: None  Antimicrobials:  Anti-infectives (From  admission, onward)   Start     Dose/Rate Route Frequency Ordered Stop   01/15/21 1815  vancomycin (VANCOCIN) capsule 125 mg  Status:  Discontinued        125 mg Oral 4 times daily 01/15/21 1718 01/16/21 1145   01/15/21 1800  vancomycin (VANCOCIN) 50 mg/mL oral solution 125 mg  Status:  Discontinued        125 mg Oral Every 6 hours 01/15/21 1654 01/15/21 1717   01/15/21 1700  vancomycin (VANCOREADY) IVPB 750 mg/150 mL  Status:  Discontinued        750 mg 150 mL/hr over 60 Minutes Intravenous Every 24 hours 01/14/21 1931 01/15/21 1344   01/15/21 0600  piperacillin-tazobactam (ZOSYN) IVPB 3.375 g        3.375 g 12.5 mL/hr over 240 Minutes Intravenous Every 8 hours 01/14/21 1931     01/15/21 0000  doxycycline (VIBRAMYCIN) 100 mg in sodium chloride 0.9 % 250 mL IVPB  Status:  Discontinued        100 mg 125 mL/hr over 120 Minutes Intravenous Every 12 hours 01/14/21 2310 01/15/21 1344   01/14/21 1930  piperacillin-tazobactam (ZOSYN) IVPB 3.375 g        3.375 g 100 mL/hr over 30 Minutes Intravenous  Once 01/14/21 1918 01/14/21 2005   01/14/21 1915  aztreonam (AZACTAM) 2 g in sodium chloride 0.9 % 100 mL IVPB  Status:  Discontinued        2 g 200 mL/hr over 30 Minutes Intravenous  Once 01/14/21 1901 01/14/21 1918   01/14/21 1915  metroNIDAZOLE (FLAGYL) IVPB 500 mg  Status:  Discontinued        500 mg 100 mL/hr over 60 Minutes Intravenous  Once 01/14/21 1901 01/14/21 1917   01/14/21 1915  vancomycin (VANCOCIN) IVPB 1000 mg/200 mL premix        1,000 mg 200 mL/hr over 60 Minutes Intravenous  Once 01/14/21 1901 01/14/21 2053      Subjective:  Patient seen and examined at the bedside this morning.  Since last night she is having frequent diarrhea every 2 hours.  Patient is uncomfortable due to that.  She was sitting on the chair, she was hemodynamically stable during my evaluation.  He denies any abdominal pain, nausea or vomiting.  Objective: Vitals:   01/17/21 0300 01/17/21 0400 01/17/21  0500 01/17/21 0600  BP: (!) 162/61 (!) 145/55 (!) 141/50 113/86  Pulse: 76 73 74 75  Resp: 14 13 14 18   Temp:  98 F (36.7 C)    TempSrc:  Oral    SpO2: 100% 99% 99% 100%  Weight:      Height:        Intake/Output Summary (Last 24 hours) at 01/17/2021 0733 Last data filed at 01/17/2021 0653 Gross per 24 hour  Intake 1732.12 ml  Output 850 ml  Net 882.12 ml   Filed Weights   01/14/21 1437  Weight: 52.2 kg    Examination:   General exam: Chronically looking, weak HEENT: PERRL Respiratory system:  no wheezes or crackles  Cardiovascular system: S1 & S2 heard, RRR.  Chemo-Port on the left chest Gastrointestinal system: Abdomen is nondistended, soft and nontender. Central nervous system: Alert and oriented  Extremities: No edema, no clubbing ,no cyanosis Skin: No rashes, no ulcers,no icterus   Data Reviewed: I have personally reviewed following labs and imaging studies  CBC: Recent Labs  Lab 01/11/21 1214 01/14/21 1547 01/15/21 0500 01/16/21 0538 01/17/21 0628  WBC 4.9 6.0 6.4 2.2* 3.1*  NEUTROABS 3.2 5.6 6.0  --   --   HGB 13.7 13.6 12.9 10.9* 11.1*  HCT 42.3 40.4 38.3 33.9* 32.7*  MCV 90.8 88.6 91.0 92.1 89.3  PLT 165 120* 105* 73* 790*   Basic Metabolic Panel: Recent Labs  Lab 01/14/21 1547 01/14/21 2158 01/15/21 0030 01/15/21 0500 01/16/21 0538 01/17/21 0628  NA 135 138  --  135 137 138  K 2.8* 2.9*  --  4.0 3.5 2.9*  CL 101 104  --  106 109 112*  CO2 23 25  --  25 22 20*  GLUCOSE 136* 142*  --  121* 69* 85  BUN 23 19  --  15 16 16   CREATININE 0.81 0.83  --  0.65 0.74 0.78  CALCIUM 8.9 8.6*  --  8.1* 7.6* 7.6*  MG 1.4*  --   --  3.0*  --   --   PHOS  --   --  3.2 2.8  --   --    GFR: Estimated Creatinine Clearance: 52.4 mL/min (by C-G formula based on SCr of 0.78 mg/dL). Liver Function Tests: Recent Labs  Lab 01/11/21 1214 01/14/21 1547 01/15/21 0500  AST 39 33 33  ALT 36 29 29  ALKPHOS 64 72 56  BILITOT 0.5 0.4 0.4  PROT 8.0 7.2 6.5   ALBUMIN 3.6 3.4* 2.9*   No results for input(s): LIPASE, AMYLASE in the last 168 hours. No results for input(s): AMMONIA in the last 168 hours. Coagulation Profile: Recent Labs  Lab 01/14/21 1547  INR 0.9   Cardiac Enzymes: Recent Labs  Lab 01/15/21 0030  CKTOTAL 78   BNP (last 3 results) No results for input(s): PROBNP in the last 8760 hours. HbA1C: No results for input(s): HGBA1C in the last 72 hours. CBG: Recent Labs  Lab 01/14/21 2201 01/15/21 1550  GLUCAP 142* 97   Lipid Profile: No results for input(s): CHOL, HDL, LDLCALC, TRIG, CHOLHDL, LDLDIRECT in the last 72 hours. Thyroid Function Tests: Recent Labs    01/15/21 0500  TSH 2.990   Anemia Panel: No results for input(s): VITAMINB12, FOLATE, FERRITIN, TIBC, IRON, RETICCTPCT in the last 72 hours. Sepsis Labs: Recent Labs  Lab 01/14/21 1547 01/14/21 1755 01/14/21 1922 01/14/21 2158 01/16/21 0538  PROCALCITON  --   --  3.55 4.97 3.98  LATICACIDVEN 1.3 1.0  --   --   --     Recent Results (from the past 240 hour(s))  Clostridium Difficile by PCR     Status: Abnormal   Collection Time: 01/14/21 10:00 AM   Specimen: STOOL   Stool  Result Value Ref Range Status   Toxigenic C. Difficile by PCR Positive (A) Negative Final    Comment: Toxigenic C difficile: Positive Epidemic Strain Bl/NAP1/027: Presumptive Negative   Blood Culture (routine x 2)     Status: Abnormal (Preliminary result)   Collection Time: 01/14/21  3:46 PM   Specimen: BLOOD  Result Value Ref Range Status   Specimen Description   Final    BLOOD LEFT CHEST PORTA CATH Performed at [facility], [address] [place], [place] [address]    Special Requests   Final    BOTTLES DRAWN  AEROBIC AND ANAEROBIC Blood Culture adequate volume Performed at [facility], [address] [place], [place] [address]    Culture  Setup Time (A)  Final    GRAM VARIABLE ROD AEROBIC BOTTLE ONLY CRITICAL RESULT CALLED TO, READ BACK  BY AND VERIFIED WITH: PHRMD J LEGGE [id number] AT 1059 AM BY CM    Culture (A)  Final    GRAM VARIABLE ROD IDENTIFICATION AND SUSCEPTIBILITIES TO FOLLOW Performed at [facility], [place]. [address], [place], [place] [address]    Report Status PENDING  Incomplete  Blood Culture ID Panel (Reflexed)     Status: None   Collection Time: 01/14/21  3:46 PM  Result Value Ref Range Status   Enterococcus faecalis NOT DETECTED NOT DETECTED Final   Enterococcus Faecium NOT DETECTED NOT DETECTED Final   Listeria monocytogenes NOT DETECTED NOT DETECTED Final   Staphylococcus species NOT DETECTED NOT DETECTED Final   Staphylococcus aureus (BCID) NOT DETECTED NOT DETECTED Final   Staphylococcus epidermidis NOT DETECTED NOT DETECTED Final   Staphylococcus lugdunensis NOT DETECTED NOT DETECTED Final   Streptococcus species NOT DETECTED NOT DETECTED Final   Streptococcus agalactiae NOT DETECTED NOT DETECTED Final   Streptococcus pneumoniae NOT DETECTED NOT DETECTED Final   Streptococcus pyogenes NOT DETECTED NOT DETECTED Final   A.calcoaceticus-baumannii NOT DETECTED NOT DETECTED Final   Bacteroides fragilis NOT DETECTED NOT DETECTED Final   Enterobacterales NOT DETECTED NOT DETECTED Final   Enterobacter cloacae complex NOT DETECTED NOT DETECTED Final   Escherichia coli NOT DETECTED NOT DETECTED Final   Klebsiella aerogenes NOT DETECTED NOT DETECTED Final   Klebsiella oxytoca NOT DETECTED NOT DETECTED Final   Klebsiella pneumoniae NOT DETECTED NOT DETECTED Final   Proteus species NOT DETECTED NOT DETECTED Final   Salmonella species NOT DETECTED NOT DETECTED Final   Serratia marcescens NOT DETECTED NOT DETECTED Final   Haemophilus influenzae NOT DETECTED NOT DETECTED Final   Neisseria meningitidis NOT DETECTED NOT DETECTED Final   Pseudomonas aeruginosa NOT DETECTED NOT DETECTED Final   Stenotrophomonas maltophilia NOT DETECTED NOT DETECTED Final   Candida albicans NOT DETECTED NOT DETECTED Final    Candida auris NOT DETECTED NOT DETECTED Final   Candida glabrata NOT DETECTED NOT DETECTED Final   Candida krusei NOT DETECTED NOT DETECTED Final   Candida parapsilosis NOT DETECTED NOT DETECTED Final   Candida tropicalis NOT DETECTED NOT DETECTED Final   Cryptococcus neoformans/gattii NOT DETECTED NOT DETECTED Final    Comment: Performed at [facility], [place]. [address] [place], [place] [address]  Urine culture     Status: Abnormal (Preliminary result)   Collection Time: 01/14/21  3:47 PM   Specimen: In/Out Cath Urine  Result Value Ref Range Status   Specimen Description   Final    IN/OUT CATH URINE Performed at [facility], [address] [place], [place] [address]    Special Requests   Final    NONE Performed at [facility], [address] [place], [place] [address]    Culture (A)  Final    >=100,000 COLONIES/mL ENTEROCOCCUS FAECALIS SUSCEPTIBILITIES TO FOLLOW Performed at [facility], [place] [address] [place],  [address]    Report Status PENDING  Incomplete  Resp Panel by RT-PCR (Flu A&B, Covid) Nasopharyngeal Swab     Status: None   Collection Time: 01/14/21  3:47 PM   Specimen: Nasopharyngeal Swab; Nasopharyngeal(NP) swabs in vial transport medium  Result Value  Ref Range Status   SARS Coronavirus 2 by RT PCR NEGATIVE NEGATIVE Final    Comment: (NOTE) SARS-CoV-2 target nucleic acids are NOT DETECTED.  The SARS-CoV-2 RNA is generally detectable in upper respiratory specimens during the acute phase of infection. The lowest concentration of SARS-CoV-2 viral copies this assay can detect is 138 copies/mL. A negative result does not preclude SARS-Cov-2 infection and should not be used as the sole basis for treatment or other patient management decisions. A negative result may occur with  improper specimen collection/handling, submission of specimen other than nasopharyngeal swab, presence of viral mutation(s) within the areas  targeted by this assay, and inadequate number of viral copies(<138 copies/mL). A negative result must be combined with clinical observations, patient history, and epidemiological information. The expected result is Negative.  Fact Sheet for Patients:  EntrepreneurPulse.com.au  Fact Sheet for Healthcare Providers:  IncredibleEmployment.be  This test is no t yet approved or cleared by the [place] FDA and  has been authorized for detection and/or diagnosis of SARS-CoV-2 by FDA under an Emergency Use Authorization (EUA). This EUA will remain  in effect (meaning this test can be used) for the duration of the COVID-19 declaration under Section 564(b)(1) of the Act, 21 U.S.C.section 360bbb-3(b)(1), unless the authorization is terminated  or revoked sooner.       Influenza A by PCR NEGATIVE NEGATIVE Final   Influenza B by PCR NEGATIVE NEGATIVE Final    Comment: (NOTE) The Xpert Xpress SARS-CoV-2/FLU/RSV plus assay is intended as an aid in the diagnosis of influenza from Nasopharyngeal swab specimens and should not be used as a sole basis for treatment. Nasal washings and aspirates are unacceptable for Xpert Xpress SARS-CoV-2/FLU/RSV testing.  Fact Sheet for Patients: EntrepreneurPulse.com.au  Fact Sheet for Healthcare Providers: IncredibleEmployment.be  This test is not yet approved or cleared by the [place] FDA and has been authorized for detection and/or diagnosis of SARS-CoV-2 by FDA under an Emergency Use Authorization (EUA). This EUA will remain in effect (meaning this test can be used) for the duration of the COVID-19 declaration under Section 564(b)(1) of the Act, 21 U.S.C. section 360bbb-3(b)(1), unless the authorization is terminated or revoked.  Performed at [facility], [address] [place], [place] [address]   Blood Culture (routine x 2)     Status: Abnormal (Preliminary  result)   Collection Time: 01/14/21  3:51 PM   Specimen: BLOOD LEFT ARM  Result Value Ref Range Status   Specimen Description   Final    BLOOD LEFT ARM Performed at [facility], [address] [place], [place] [address]    Special Requests   Final    BOTTLES DRAWN AEROBIC AND ANAEROBIC Blood Culture adequate volume Performed at [facility], [address] [place], [place] [address]    Culture  Setup Time (A)  Final    GRAM VARIABLE ROD AEROBIC BOTTLE ONLY CRITICAL VALUE NOTED.  VALUE IS CONSISTENT WITH PREVIOUSLY REPORTED AND CALLED VALUE.    Culture (A)  Final    GRAM VARIABLE ROD IDENTIFICATION TO FOLLOW Performed at [facility], [place] [address], [place],  [address]    Report Status PENDING  Incomplete  Respiratory (~20 pathogens) panel by PCR     Status: None   Collection Time: 01/14/21  7:22 PM   Specimen: Nasopharyngeal Swab; Respiratory  Result Value Ref Range Status   Adenovirus NOT DETECTED NOT DETECTED Final   Coronavirus 229E NOT  DETECTED NOT DETECTED Final    Comment: (NOTE) The Coronavirus on the Respiratory Panel, DOES NOT test for the novel  Coronavirus (2019 nCoV)    Coronavirus HKU1 NOT DETECTED NOT DETECTED Final   Coronavirus NL63 NOT DETECTED NOT DETECTED Final   Coronavirus OC43 NOT DETECTED NOT DETECTED Final   Metapneumovirus NOT DETECTED NOT DETECTED Final   Rhinovirus / Enterovirus NOT DETECTED NOT DETECTED Final   Influenza A NOT DETECTED NOT DETECTED Final   Influenza B NOT DETECTED NOT DETECTED Final   Parainfluenza Virus 1 NOT DETECTED NOT DETECTED Final   Parainfluenza Virus 2 NOT DETECTED NOT DETECTED Final   Parainfluenza Virus 3 NOT DETECTED NOT DETECTED Final   Parainfluenza Virus 4 NOT DETECTED NOT DETECTED Final   Respiratory Syncytial Virus NOT DETECTED NOT DETECTED Final   Bordetella pertussis NOT DETECTED NOT DETECTED Final   Bordetella Parapertussis NOT DETECTED NOT DETECTED Final    Chlamydophila pneumoniae NOT DETECTED NOT DETECTED Final   Mycoplasma pneumoniae NOT DETECTED NOT DETECTED Final    Comment: Performed at [facility], [place] [address] [place], [place] [address]  MRSA PCR Screening     Status: None   Collection Time: 01/14/21 11:15 PM   Specimen: Nasopharyngeal  Result Value Ref Range Status   MRSA by PCR NEGATIVE NEGATIVE Final    Comment:        The GeneXpert MRSA Assay (FDA approved for NASAL specimens only), is one component of a comprehensive MRSA colonization surveillance program. It is not intended to diagnose MRSA infection nor to guide or monitor treatment for MRSA infections. Performed at [facility], [place] [address], [place], [place] [address]   Culture, blood (routine x 2)     Status: None (Preliminary result)   Collection Time: 01/16/21  9:43 AM   Specimen: BLOOD  Result Value Ref Range Status   Specimen Description   Final    BLOOD RIGHT ANTECUBITAL Performed at [place] [address] [place], [place] [address]    Special Requests   Final    BOTTLES DRAWN AEROBIC AND ANAEROBIC Blood Culture adequate volume Performed at [place] [address] [place], [place] [address]    Culture   Final    NO GROWTH < 24 HOURS Performed at [place] [address], [place], [place] [address]    Report Status PENDING  Incomplete  Culture, blood (routine x 2)     Status: None (Preliminary result)   Collection Time: 01/16/21  9:51 AM   Specimen: BLOOD  Result Value Ref Range Status   Specimen Description   Final    BLOOD LEFT ANTECUBITAL Performed at [place] [address], [place], [place] [address]    Special Requests   Final    BOTTLES DRAWN AEROBIC ONLY Blood Culture adequate volume Performed at [place] [address] [place], [place] [address]    Culture   Final    NO GROWTH < 24 HOURS Performed at  [place] [address] [place],  [address]    Report Status PENDING  Incomplete         Radiology Studies: ECHOCARDIOGRAM COMPLETE  Result Date: 01/15/2021    ECHOCARDIOGRAM REPORT   Patient Name:   KARRIS DEANGELO Tuohy Date of Exam: 01/15/2021 Medical Rec #:  [id number]          Height:       63.0  in Accession #:    [id number]         Weight:       115.0 lb Date of Birth:  [birth year]         BSA:          1.528 m Patient Age:    26 years           BP:           175/109 mmHg Patient Gender: F                  HR:           89 bpm. Exam Location:  Inpatient Procedure: 2D Echo, Cardiac Doppler and Color Doppler Indications:    Elevated Troponin  History:        Patient has no prior history of Echocardiogram examinations.                 Risk Factors:Sleep Apnea. Chills, fever, headache, nausea,                 abdominal pain, chest pain, dyspnea, and leg swelling. Acute                 metabolic encephalopathy likely from sepsis. Common variable                 immune deficiency. Thyroid disease. GERD. Chronic kidney                 disease.  Sonographer:    Darlina Sicilian RDCS Referring Phys: [phone] ANASTASSIA DOUTOVA  Sonographer Comments: Suboptimal parasternal window, suboptimal apical window and Technically difficult study due to poor echo windows. IMPRESSIONS  1. Non-traditional imaging windows limit the diagostic yield of this exam. Measurements and assessment of valvular heart disease are approximate values.  2. Left ventricular ejection fraction, by estimation, is 55 to 60%. The left ventricle has normal function. Left ventricular endocardial border not optimally defined to evaluate regional wall motion. Left ventricular diastolic parameters are indeterminate.  3. Right ventricular systolic function is normal. The right ventricular size is normal. There is normal pulmonary artery systolic pressure. The estimated right ventricular systolic pressure is 19.1 mmHg.  4. The mitral valve is  degenerative. Mild to moderate mitral valve regurgitation. Moderate mitral annular calcification.  5. Tricuspid valve regurgitation is mild to moderate.  6. The aortic valve is grossly normal. Aortic valve regurgitation is mild to moderate. No aortic stenosis by visual assessment of the valve, Doppler assessment suboptimal.  7. The inferior vena cava is normal in size with greater than 50% respiratory variability, suggesting right atrial pressure of 3 mmHg. FINDINGS  Left Ventricle: Left ventricular ejection fraction, by estimation, is 55 to 60%. The left ventricle has normal function. Left ventricular endocardial border not optimally defined to evaluate regional wall motion. The left ventricular internal cavity size was normal in size. There is no left ventricular hypertrophy. Left ventricular diastolic parameters are indeterminate. Right Ventricle: The right ventricular size is normal. No increase in right ventricular wall thickness. Right ventricular systolic function is normal. There is normal pulmonary artery systolic pressure. The tricuspid regurgitant velocity is 2.84 m/s, and  with an assumed right atrial pressure of 3 mmHg, the estimated right ventricular systolic pressure is 47.8 mmHg. Left Atrium: Left atrial size was normal in size. Right Atrium: Right atrial size was normal in size. Pericardium: There is no evidence of pericardial effusion. Mitral Valve: The mitral valve is degenerative in appearance. Moderate  mitral annular calcification. Mild to moderate mitral valve regurgitation. Tricuspid Valve: The tricuspid valve is grossly normal. Tricuspid valve regurgitation is mild to moderate. Aortic Valve: The aortic valve is grossly normal. Aortic valve regurgitation is mild to moderate. No aortic stenosis is present. Pulmonic Valve: The pulmonic valve was not well visualized. Pulmonic valve regurgitation is trivial. No evidence of pulmonic stenosis. Aorta: The aortic root was not well visualized. Venous:  The inferior vena cava is normal in size with greater than 50% respiratory variability, suggesting right atrial pressure of 3 mmHg. IAS/Shunts: No atrial level shunt detected by color flow Doppler.  LEFT VENTRICLE PLAX 2D LVOT diam:     1.80 cm  Diastology LV SV:         43       LV e' medial:    6.64 cm/s LV SV Index:   28       LV E/e' medial:  12.5 LVOT Area:     2.54 cm LV e' lateral:   6.64 cm/s                         LV E/e' lateral: 12.5  RIGHT VENTRICLE RV S prime:     12.10 cm/s TAPSE (M-mode): 2.2 cm LEFT ATRIUM             Index       RIGHT ATRIUM          Index LA Vol (A2C):   31.8 ml 20.81 ml/m RA Area:     8.13 cm LA Vol (A4C):   34.9 ml 22.83 ml/m RA Volume:   13.80 ml 9.03 ml/m LA Biplane Vol: 33.8 ml 22.11 ml/m  AORTIC VALVE LVOT Vmax:   94.90 cm/s LVOT Vmean:  62.400 cm/s LVOT VTI:    0.170 m  AORTA Ao Root diam: 2.60 cm MITRAL VALVE                TRICUSPID VALVE MV Area (PHT): 3.42 cm     TR Peak grad:   32.3 mmHg MV Decel Time: 222 msec     TR Vmax:        284.00 cm/s MV E velocity: 83.20 cm/s MV A velocity: 110.00 cm/s  SHUNTS MV E/A ratio:  0.76         Systemic VTI:  0.17 m                             Systemic Diam: 1.80 cm Cherlynn Kaiser MD Electronically signed by Cherlynn Kaiser MD Signature Date/Time: 01/15/2021/9:57:32 AM    Final    VAS [place] LOWER EXTREMITY VENOUS (DVT)  Result Date: 01/15/2021  Lower Venous DVT Study Patient Name:  MARYLAND STELL [place]  Date of Exam:   01/15/2021 Medical Rec #: [id number]           Accession #:    [id number] Date of Birth: November 07, 1945          Patient Gender: F Patient Age:   74Y Exam Location:  [facility] Procedure:      VAS [place] LOWER EXTREMITY VENOUS (DVT) Referring Phys: [id number] --------------------------------------------------------------------------------  Indications: Edema.  Risk Factors: None identified. Comparison Study: 01/11/2021 - Negative for DVT on the right. Performing Technologist: Oliver Hum RVT   Examination Guidelines: A complete evaluation includes B-mode imaging, spectral Doppler, color Doppler, and power Doppler as needed of all accessible portions of each  vessel. Bilateral testing is considered an integral part of a complete examination. Limited examinations for reoccurring indications may be performed as noted. The reflux portion of the exam is performed with the patient in reverse Trendelenburg.  +-----+---------------+---------+-----------+----------+--------------+ RIGHTCompressibilityPhasicitySpontaneityPropertiesThrombus Aging +-----+---------------+---------+-----------+----------+--------------+ CFV  Full           Yes      Yes                                 +-----+---------------+---------+-----------+----------+--------------+   +---------+---------------+---------+-----------+----------+--------------+ LEFT     CompressibilityPhasicitySpontaneityPropertiesThrombus Aging +---------+---------------+---------+-----------+----------+--------------+ CFV      Full           Yes      Yes                                 +---------+---------------+---------+-----------+----------+--------------+ SFJ      Full                                                        +---------+---------------+---------+-----------+----------+--------------+ FV Prox  Full                                                        +---------+---------------+---------+-----------+----------+--------------+ FV Mid   Full                                                        +---------+---------------+---------+-----------+----------+--------------+ FV DistalFull                                                        +---------+---------------+---------+-----------+----------+--------------+ PFV      Full                                                        +---------+---------------+---------+-----------+----------+--------------+ POP      Full           Yes       Yes                                 +---------+---------------+---------+-----------+----------+--------------+ PTV      Full                                                        +---------+---------------+---------+-----------+----------+--------------+ PERO     Full                                                        +---------+---------------+---------+-----------+----------+--------------+  Summary: RIGHT: - No evidence of common femoral vein obstruction.  LEFT: - There is no evidence of deep vein thrombosis in the lower extremity.  - No cystic structure found in the popliteal fossa.  *See table(s) above for measurements and observations. Electronically signed by Harold Barban MD on 01/15/2021 at 10:18:34 PM.    Final         Scheduled Meds: . amLODipine  5 mg Oral Daily  . Chlorhexidine Gluconate Cloth  6 each Topical Daily  . levothyroxine  75 mcg Oral Q0600  . mouth rinse  15 mL Mouth Rinse BID  . pantoprazole  40 mg Oral Daily  . sodium chloride flush  10-40 mL Intracatheter Q12H  . tamsulosin  0.4 mg Oral Daily   Continuous Infusions: . sodium chloride Stopped (01/17/21 0628)  . piperacillin-tazobactam (ZOSYN)  IV 12.5 mL/hr at 01/17/21 0653     LOS: 1 day    Time spent:35 mins. More than 50% of that time was spent in counseling and/or coordination of care.      Shelly Coss, MD [facility] P6/10/2020, 7:33 AM

## 2021-01-17 NOTE — Progress Notes (Signed)
Pharmacy Antibiotic Note  Megan Brewer is a 75 y.o. female admitted on 01/14/2021 with sepsis.  Pharmacy has been consulted for vancomycin and zosyn dosing. Patient with PMH significant for lung cancer, immunoglobulin deficiency (PTA Gammagard infusions) presents with reports of generalized weakness, chills, and tachycardia.  01/17/21  - BCx w/ non-fermenting oxidase positive GNR sensitive to Zosyn, sent to [facility] to confirm  - vancomycin stopped - SCr stable  Plan: Continue Zosyn 3.375 G Q8H  F/U final ID and sensitivities  Discussing resuming PO vanc with TRH (patient is having more stools but initially ID considering non-active infection)  Height: 5\' 3"  (160 cm) Weight: 52.2 kg (115 lb) IBW/kg (Calculated) : 52.4  Temp (24hrs), Avg:98.1 F (36.7 C), Min:97.6 F (36.4 C), Max:98.5 F (36.9 C)  Recent Labs  Lab 01/11/21 1214 01/14/21 1547 01/14/21 1755 01/14/21 2158 01/15/21 0500 01/16/21 0538 01/17/21 0628  WBC 4.9 6.0  --   --  6.4 2.2* 3.1*  CREATININE 0.90 0.81  --  0.83 0.65 0.74 0.78  LATICACIDVEN  --  1.3 1.0  --   --   --   --     Estimated Creatinine Clearance: 52.4 mL/min (by C-G formula based on SCr of 0.78 mg/dL).    Allergies  Allergen Reactions  . Tetracycline Nausea Only    Causes ringing in ears, dizziness, migraine, nausea  . Corticosteroids Other (See Comments)    12/02/2016 interview by Melissa Montane: Oral dosing causes migraines/nausea/tinnitus. Prev tolerated IV and intranasal admin w/o difficulty.  . Cefixime Other (See Comments)    Headache   . Ciprofloxacin Other (See Comments)    Headache, dizziness, ringing in the ears  . Doxycycline Other (See Comments)    unknown  . Gabapentin     Throat swells/closes  . Isometheptene-Dichloral-Apap Other (See Comments)    unknown  . Ketorolac     12/02/2016 interview by SUO: Oral dosing prednisone/corticosteroids causes migraines/nausea/tinnitus. Prev tolerated IV and intranasal admin w/o difficulty.   . Prednisone Other (See Comments)    Migraine, dizzy, ringing in ears-can take it IV 12/02/2016 interview by [name]: Oral prednisone dosing causes migraines/nausea/tinnitus. Prev tolerated IV and intranasal admin w/o difficulty.  . Promethazine Other (See Comments)    causes severe abdominal pain when taken IV; however she can take PO or IM  . Stadol [Butorphanol]     Cerebral pain  . Erythromycin Base Diarrhea and Itching    Severe diarrhea  . Propoxyphene Nausea Only    Antimicrobials this admission: 5/31 Vancomycin x 1 5/31 Zosyn >> 6/1 po vanc >> 6/2, 6/3 ? Dose adjustments this admission:   Microbiology results: 5/31 BCx: GVR 2/4 aerobic both sets 5/31 UCx: E faecalis 6/2 BCx: ngtd 5/31 Outpatient C diff PCR +  Thank you for allowing pharmacy to be a part of this patient's care.  Ulice Dash D  01/17/2021 12:05 PM

## 2021-01-18 LAB — CBC
HCT: 34.6 % — ABNORMAL LOW (ref 36.0–46.0)
Hemoglobin: 11.5 g/dL — ABNORMAL LOW (ref 12.0–15.0)
MCH: 29.6 pg (ref 26.0–34.0)
MCHC: 33.2 g/dL (ref 30.0–36.0)
MCV: 89.2 fL (ref 80.0–100.0)
Platelets: 128 10*3/uL — ABNORMAL LOW (ref 150–400)
RBC: 3.88 MIL/uL (ref 3.87–5.11)
RDW: 13.5 % (ref 11.5–15.5)
WBC: 2.6 10*3/uL — ABNORMAL LOW (ref 4.0–10.5)
nRBC: 0 % (ref 0.0–0.2)

## 2021-01-18 LAB — BASIC METABOLIC PANEL
Anion gap: 5 (ref 5–15)
BUN: 8 mg/dL (ref 8–23)
CO2: 21 mmol/L — ABNORMAL LOW (ref 22–32)
Calcium: 7.6 mg/dL — ABNORMAL LOW (ref 8.9–10.3)
Chloride: 112 mmol/L — ABNORMAL HIGH (ref 98–111)
Creatinine, Ser: 0.42 mg/dL — ABNORMAL LOW (ref 0.44–1.00)
GFR, Estimated: >60 mL/min (ref >60)
Glucose, Bld: 98 mg/dL (ref 70–99)
Potassium: 3.7 mmol/L (ref 3.5–5.1)
Sodium: 138 mmol/L (ref 135–145)

## 2021-01-18 MED ORDER — IBUPROFEN 200 MG PO TABS
400.0000 mg | ORAL_TABLET | ORAL | Status: DC | PRN
  Administered 2021-01-18 – 2021-01-22 (×11): 400 mg via ORAL
  Filled 2021-01-18 (×11): qty 2

## 2021-01-18 MED ORDER — KETOROLAC TROMETHAMINE 30 MG/ML IJ SOLN
30.0000 mg | Freq: Once | INTRAMUSCULAR | Status: AC
  Administered 2021-01-18: 30 mg via INTRAVENOUS
  Filled 2021-01-18: qty 1

## 2021-01-18 MED ORDER — HYDRALAZINE HCL 20 MG/ML IJ SOLN
10.0000 mg | INTRAMUSCULAR | Status: DC | PRN
  Administered 2021-01-18: 10 mg via INTRAVENOUS

## 2021-01-18 MED ORDER — ACETAMINOPHEN 325 MG PO TABS
650.0000 mg | ORAL_TABLET | Freq: Four times a day (QID) | ORAL | Status: DC | PRN
  Administered 2021-01-18 – 2021-01-22 (×4): 650 mg via ORAL
  Filled 2021-01-18 (×4): qty 2

## 2021-01-18 MED ORDER — PROCHLORPERAZINE EDISYLATE 10 MG/2ML IJ SOLN
10.0000 mg | Freq: Once | INTRAMUSCULAR | Status: AC
  Administered 2021-01-18: 10 mg via INTRAVENOUS
  Filled 2021-01-18: qty 2

## 2021-01-18 MED ORDER — DIPHENHYDRAMINE HCL 50 MG/ML IJ SOLN
25.0000 mg | Freq: Once | INTRAMUSCULAR | Status: AC
  Administered 2021-01-18: 25 mg via INTRAVENOUS
  Filled 2021-01-18: qty 1

## 2021-01-18 MED ORDER — AMLODIPINE BESYLATE 10 MG PO TABS
10.0000 mg | ORAL_TABLET | Freq: Every day | ORAL | Status: DC
  Administered 2021-01-18 – 2021-01-22 (×5): 10 mg via ORAL
  Filled 2021-01-18 (×5): qty 1

## 2021-01-18 MED ORDER — ONDANSETRON HCL 4 MG/2ML IJ SOLN
4.0000 mg | Freq: Three times a day (TID) | INTRAMUSCULAR | Status: DC | PRN
  Administered 2021-01-18: 4 mg via INTRAVENOUS
  Filled 2021-01-18: qty 2

## 2021-01-18 NOTE — Progress Notes (Signed)
Physical Therapy Treatment Patient Details Name: Megan Brewer MRN: [id number] DOB: [birth year] Today's Date: 01/18/2021    History of Present Illness Patient is a 75 year old female generalized fatigue chills tachycardia febrile. PMH includes lung cancer sp ressection, immunoglobulin deficiency (PTA Gammagard infusions), recurrent UTI  Sp Gastric bipass 8-10 y ago    PT Comments    Pt walked to/from bathroom with RW this session. Ambulation distance limited by headache and pt generally not feeling well on today. Assisted pt back to bed at her request. Will continue to follow and progress activity as tolerated.     Follow Up Recommendations  Home health PT     Equipment Recommendations  None recommended by PT    Recommendations for Other Services       Precautions / Restrictions Precautions Precautions: Fall Precaution Comments: Droplet/Enteric Restrictions Weight Bearing Restrictions: No    Mobility  Bed Mobility Overal bed mobility: Needs Assistance Bed Mobility: Supine to Sit;Sit to Supine     Supine to sit: Supervision Sit to supine: Supervision   General bed mobility comments: supv for safety, lines    Transfers Overall transfer level: Needs assistance Equipment used: Rolling walker (2 wheeled) Transfers: Sit to/from Stand Sit to Stand: Min guard         General transfer comment: Min guard for safesty. cues for hand placement. Increased time.  Ambulation/Gait Ambulation/Gait assistance: Min assist Gait Distance (Feet): 15 Feet (x2) Assistive device: Rolling walker (2 wheeled) Gait Pattern/deviations: Step-through pattern;Decreased stride length;Trunk flexed     General Gait Details: Assist to steady intermittently. Pt walked to/from bathroom with RW. Deferred further ambulation in hallway 2* headache/pt not feeling up to it today.   Stairs             Wheelchair Mobility    Modified Rankin (Stroke Patients Only)       Balance  Overall balance assessment: Needs assistance         Standing balance support: Bilateral upper extremity supported Standing balance-Leahy Scale: Fair                              Cognition Arousal/Alertness: Awake/alert Behavior During Therapy: WFL for tasks assessed/performed Overall Cognitive Status: Within Functional Limits for tasks assessed                                        Exercises      General Comments        Pertinent Vitals/Pain Pain Assessment: Faces Faces Pain Scale: Hurts even more Pain Location: headache Pain Intervention(s): Monitored during session;Repositioned    Home Living                      Prior Function            PT Goals (current goals can now be found in the care plan section) Progress towards PT goals: Progressing toward goals    Frequency    Min 3X/week      PT Plan Current plan remains appropriate    Co-evaluation              AM-PAC PT "6 Clicks" Mobility   Outcome Measure  Help needed turning from your back to your side while in a flat bed without using bedrails?: None Help needed moving from lying on your  back to sitting on the side of a flat bed without using bedrails?: A Little Help needed moving to and from a bed to a chair (including a wheelchair)?: A Little Help needed standing up from a chair using your arms (e.g., wheelchair or bedside chair)?: A Little Help needed to walk in hospital room?: A Little Help needed climbing 3-5 steps with a railing? : A Lot 6 Click Score: 18    End of Session   Activity Tolerance: Patient limited by pain Patient left: in bed;with call bell/phone within reach;with bed alarm set   PT Visit Diagnosis: Muscle weakness (generalized) (M62.81);Difficulty in walking, not elsewhere classified (R26.2)     Time: 9476-5465 PT Time Calculation (min) (ACUTE ONLY): 18 min  Charges:  $Gait Training: 8-22 mins                          Doreatha Massed, PT Acute Rehabilitation  Office: [phone] Pager: [phone]

## 2021-01-18 NOTE — Progress Notes (Signed)
Pt transfer to 4W in wheelchair. Pt belongings packed. Pt stable at time of transfer. Pt brought to new room & Quita Skye RN notified of pt's arrival.

## 2021-01-18 NOTE — Consult Note (Signed)
I have been asked to see the patient by Dr. Shelly Coss, for evaluation and management of urinary retention.  History of present illness: 75 year old female who was seen today in the ICU for urinary retention.  The patient has a past medical history significant for IgG immunodeficiency and recurrent urinary tract infections.  She also has a history of lung cancer that was treated.  She presented to the emergency department with generalized fatigue chills and tachycardia.  She was febrile as well.  She was noted to be bacteremic.  The patient has a history of recurrent urinary tract infections, she relates having 14 urinary tract infections in the last year.  It is unclear whether the patient empties her bladder well, she does take torsemide, and when she takes this she has a strong stream, otherwise she has a very weak stream.  She does not feel as if she empties her bladder completely.  She does have a history of urinary incontinence as well.  The etiology of the patient's recurrent infections is unclear at this time.  She has been worked up and followed quite closely by a urologist in [place].  During the patient's evaluation she had a renal ultrasound because of worsening renal function.  This demonstrated no hydroureteronephrosis, but the patient's bladder was full despite having a Foley catheter within it.  Since the renal ultrasound the bladder has emptied, and her most recent postvoid residual was 0.  Review of systems: A 12 point comprehensive review of systems was obtained and is negative unless otherwise stated in the history of present illness.  Patient Active Problem List   Diagnosis Date Noted  . Acute metabolic encephalopathy 09/73/5329  . SIRS (systemic inflammatory response syndrome) ([place]) 01/14/2021  . Elevated troponin 01/14/2021  . Hypokalemia 01/14/2021  . Headache 01/14/2021  . Chest discomfort 03/22/2019  . Weight loss 01/27/2019  . Acquired hypothyroidism 01/27/2019   . Esophagitis 01/27/2019  . Intractable migraine without status migrainosus 01/27/2019  . Osteoporosis 01/27/2019  . Chronic sinusitis 12/28/2018  . Food intolerance/GI symptoms 12/28/2018  . Acute maxillary sinusitis 12/28/2018  . Lower leg edema 12/20/2018  . Chronic pain of both shoulders 05/24/2018  . Bilateral leg edema 05/24/2018  . Chronic pain syndrome 03/31/2018  . Left bundle branch block 10/28/2017  . Palpitations 10/28/2017  . History of gastric bypass 05/21/2017  . Polypharmacy 02/11/2017  . Senile nuclear sclerosis 11/26/2016  . Chronic post-thoracotomy pain 04/09/2014  . Chronic kidney disease 04/09/2014  . Sleep disturbances 09/20/2012  . Restless legs syndrome 07/07/2012  . Hypertonicity of bladder 05/23/2012  . Essential tremor 04/01/2012  . Memory loss 04/01/2012  . Incomplete emptying of bladder 01/14/2012  . Urge incontinence 10/28/2011  . Tension type headache 06/10/2011  . Mixed urge and stress incontinence 05/28/2011  . Hypogammaglobulinemia ([place]) 06/22/2010  . Severe episode of recurrent major depressive disorder, without psychotic features ([place]) 06/22/2010  . Hypothyroidism 02/12/2010  . 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 ARACHNOIDITIS 01/31/2010  . OBSTRUCTIVE SLEEP APNEA 01/31/2010  . Chronic rhinitis 01/31/2010  . Arachnoiditis 01/31/2010    No current facility-administered medications on file prior to encounter.   Current Outpatient Medications on File Prior to Encounter  Medication Sig Dispense Refill  . acetaminophen (TYLENOL) 500 MG tablet Take 1-2 tablets (500-1,000 mg total) by mouth every 8 (eight) hours as needed (Max dose 3000 mg in 24 hours).    . Azelastine-Fluticasone 137-50 MCG/ACT SUSP ONE SPRAY EACH NOSTRIL TWICE A DAY AS NEEDED (Patient  taking differently: Place 1 spray into both nostrils 2 (two) times daily as needed (allergies). ONE SPRAY EACH NOSTRIL TWICE A DAY AS NEEDED) 23 g 0  . baclofen (LIORESAL) 10 MG  tablet Take 10 mg by mouth daily as needed for muscle spasms.    . Calcium Citrate 200 MG TABS Take 1 tablet by mouth daily.    . cholecalciferol (VITAMIN D3) 25 MCG (1000 UNIT) tablet Take 1,000 Units by mouth daily.    . cycloSPORINE (RESTASIS) 0.05 % ophthalmic emulsion Place 1 drop into both eyes 2 (two) times daily.    Marland Kitchen dexlansoprazole (DEXILANT) 60 MG capsule Take 1 capsule (60 mg total) by mouth daily. Crack open the capsule and pour into spoon prior to ingestion. PLEASE SCHEDULE AN OFFICE VISIT FOR FURTHER REFILLS. Thank you 30 capsule 0  . estradiol (ESTRACE) 0.1 MG/GM vaginal cream Place 1 Applicatorful vaginally 3 (three) times a week.    . ferrous sulfate 325 (65 FE) MG tablet Take 650 mg by mouth daily with breakfast.    . hydrOXYzine (VISTARIL) 25 MG capsule TAKE ONE CAPSULE BY MOUTH TWICE DAILY AS NEEDED (Patient taking differently: Take 25 mg by mouth 2 (two) times daily as needed for anxiety.) 30 capsule 0  . ibuprofen (ADVIL) 200 MG tablet Take 200 mg by mouth every 6 (six) hours as needed for mild pain.    Marland Kitchen immune globulin, human, (GAMMAGARD S/D LESS IGA) 10 g injection Inject into the vein every 21 ( twenty-one) days.     Marland Kitchen lamoTRIgine (LAMICTAL) 200 MG tablet TAKE 1 TABLET BY MOUTH DAILY (Patient taking differently: Take 200 mg by mouth at bedtime.) 30 tablet 2  . LASTACAFT 0.25 % SOLN Apply 1 drop to eye daily.    Marland Kitchen levothyroxine (SYNTHROID) 75 MCG tablet Take one tablet by mouth once daily 30 minutes before breakfast on empty stomach. 90 tablet 1  . magnesium oxide (MAG-OX) 400 MG tablet Take 400 mg by mouth daily.     . mirtazapine (REMERON) 30 MG tablet Take 1/2 tablet at bedtime for one week, then increase to 1 tablet at bedtime 30 tablet 2  . potassium chloride SA (KLOR-CON) 20 MEQ tablet TAKE TWO TABLETS BY MOUTH DAILY (MAY DISSOLVE IN WATER) 180 tablet 1  . rOPINIRole (REQUIP) 1 MG tablet TAKE 1 TABLET BY MOUTH AT BEDTIME 90 tablet 0  . saccharomyces boulardii  (FLORASTOR) 250 MG capsule Take 1 capsule (250 mg total) by mouth 2 (two) times daily. 30 capsule 3  . sucralfate (CARAFATE) 1 GM/10ML suspension Take 2 tsp by mouth three times daily as needed. (Patient taking differently: Take 2 g by mouth 3 (three) times daily as needed (gerd).) 420 mL 1  . tiZANidine (ZANAFLEX) 4 MG tablet Take 1 tablet (4 mg total) by mouth every 6 (six) hours as needed for muscle spasms. 10 tablet 0  . torsemide (DEMADEX) 20 MG tablet TAKE 1 TABLET BY MOUTH DAILY 90 tablet 1  . triamcinolone cream (KENALOG) 0.1 % Apply 1 application topically 2 (two) times daily as needed (rash).    Marland Kitchen ZINC OXIDE, TOPICAL, 10 % CREA Apply 1 application topically in the morning and at bedtime. (Patient taking differently: Apply 1 application topically 2 (two) times daily as needed (rash).) 113 g 3  . Buprenorphine HCl-Naloxone HCl 4-1 MG FILM Place 1 strip under the tongue 2 (two) times daily. (Patient not taking: No sig reported)    . Erenumab-aooe (AIMOVIG, 140 MG DOSE,) 70 MG/ML SOAJ Inject  into the skin every 30 (thirty) days.    Marland Kitchen UNABLE TO FIND Med Name: [name] 1 by mouth daily for UTI prevention      Past Medical History:  Diagnosis Date  . Anemia   . Arachnoiditis   . Bipolar disorder ([place])   . Cataract    removed  . Chronic post-thoracotomy pain   . CKD (chronic kidney disease)   . GERD (gastroesophageal reflux disease)   . Hypogammaglobulinemia ([place])   . Hypotension   . Hypothyroid   . 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 Lung cancer ([place]) 2011, 2014  . Memory loss   . Migraine   . Opioid abuse ([place])   . Osteoporosis   . Presence of neurostimulator   . Restless legs   . Sleep apnea     Past Surgical History:  Procedure Laterality Date  . ABDOMINAL HYSTERECTOMY    . CARPAL TUNNEL RELEASE    . CATARACT EXTRACTION Bilateral 2019  . CHOLECYSTECTOMY    . COLONOSCOPY  2015   [facility]  . CT LUNG SCREENING  2018  . DG  BONE DENSITY ([place] [facility])  2018  . DIAGNOSTIC  MAMMOGRAM  2019  . GASTRIC BYPASS    . LUNG CANCER SURGERY  02/2010  . MULTIPLE TOOTH EXTRACTIONS    . SPINAL CORD STIMULATOR IMPLANT      Social History   Tobacco Use  . Smoking status: Never Smoker  . Smokeless tobacco: Never Used  Vaping Use  . Vaping Use: Never used  Substance Use Topics  . Alcohol use: Not Currently  . Drug use: Never    Family History  Problem Relation Age of Onset  . Hypertension Mother   . GER disease Mother   . Dementia Mother   . Osteoporosis Mother   . Arthritis Mother   . Bipolar disorder Mother   . Parkinson's disease Father   . Congestive Heart Failure Father   . Pneumonia Father   . Arthritis Father   . Dementia Father   . Depression Father   . Asthma Sister   . Depression Sister   . Bipolar disorder Brother   . Asthma Daughter   . Colon cancer Neg Hx   . Esophageal cancer Neg Hx   . Stomach cancer Neg Hx   . Rectal cancer Neg Hx     PE: Vitals:   01/18/21 0823 01/18/21 0948 01/18/21 1000 01/18/21 1138  BP: (!) 168/68 (!) 135/48 (!) 142/68 124/60  Pulse:   94 97  Resp:    (!) 21  Temp:    98.4 F (36.9 C)  TempSrc:    Oral  SpO2:   100% 95%  Weight:      Height:       Patient appears to be in no acute distress  patient is alert and oriented x3 Atraumatic normocephalic head No cervical or supraclavicular lymphadenopathy appreciated No increased work of breathing, no audible wheezes/rhonchi Regular sinus rhythm/rate Abdomen is soft, nontender, nondistended, no CVA or suprapubic tenderness Foley catheter emanating from a retracted urethra, vaginal introitus is atrophied and stenosed. Lower extremities are symmetric without appreciable edema Grossly neurologically intact No identifiable skin lesions  Recent Labs    01/16/21 0538 01/17/21 0628 01/18/21 0500  WBC 2.2* 3.1* 2.6*  HGB 10.9* 11.1* 11.5*  HCT 33.9* 32.7* 34.6*   Recent Labs    01/16/21 0538 01/17/21 0628 01/18/21 0500  NA 137 138 138  K 3.5 2.9*  3.7  CL 109  112* 112*  CO2 22 20* 21*  GLUCOSE 69* 85 98  BUN 16 16 8   CREATININE 0.74 0.78 0.42*  CALCIUM 7.6* 7.6* 7.6*   No results for input(s): LABPT, INR in the last 72 hours. No results for input(s): LABURIN in the last 72 hours. Results for orders placed or performed during the hospital encounter of 01/14/21  Blood Culture (routine x 2)     Status: Abnormal (Preliminary result)   Collection Time: 01/14/21  3:46 PM   Specimen: BLOOD  Result Value Ref Range Status   Specimen Description   Final    BLOOD LEFT CHEST PORTA CATH Performed at [facility], [address] [place], [place] [address]    Special Requests   Final    BOTTLES DRAWN AEROBIC AND ANAEROBIC Blood Culture adequate volume Performed at [facility], [address] [place], [place] [address]    Culture  Setup Time (A)  Final    GRAM VARIABLE ROD AEROBIC BOTTLE ONLY CRITICAL RESULT CALLED TO, READ BACK BY AND VERIFIED WITH: PHRMD J LEGGE [id number] AT 1059 AM BY CM    Culture (A)  Final    GRAM VARIABLE ROD SENT TO [facility] FOR IDENTIFICATION Sent to [facility] for further susceptibility testing. Performed at [facility], [place] [address] [place], [place] [address]    Report Status PENDING  Incomplete  Blood Culture ID Panel (Reflexed)     Status: None   Collection Time: 01/14/21  3:46 PM  Result Value Ref Range Status   Enterococcus faecalis NOT DETECTED NOT DETECTED Final   Enterococcus Faecium NOT DETECTED NOT DETECTED Final   Listeria monocytogenes NOT DETECTED NOT DETECTED Final   Staphylococcus species NOT DETECTED NOT DETECTED Final   Staphylococcus aureus (BCID) NOT DETECTED NOT DETECTED Final   Staphylococcus epidermidis NOT DETECTED NOT DETECTED Final   Staphylococcus lugdunensis NOT DETECTED NOT DETECTED Final   Streptococcus species NOT DETECTED NOT DETECTED Final   Streptococcus agalactiae NOT DETECTED NOT DETECTED Final   Streptococcus pneumoniae NOT DETECTED NOT  DETECTED Final   Streptococcus pyogenes NOT DETECTED NOT DETECTED Final   A.calcoaceticus-baumannii NOT DETECTED NOT DETECTED Final   Bacteroides fragilis NOT DETECTED NOT DETECTED Final   Enterobacterales NOT DETECTED NOT DETECTED Final   Enterobacter cloacae complex NOT DETECTED NOT DETECTED Final   Escherichia coli NOT DETECTED NOT DETECTED Final   Klebsiella aerogenes NOT DETECTED NOT DETECTED Final   Klebsiella oxytoca NOT DETECTED NOT DETECTED Final   Klebsiella pneumoniae NOT DETECTED NOT DETECTED Final   Proteus species NOT DETECTED NOT DETECTED Final   Salmonella species NOT DETECTED NOT DETECTED Final   Serratia marcescens NOT DETECTED NOT DETECTED Final   Haemophilus influenzae NOT DETECTED NOT DETECTED Final   Neisseria meningitidis NOT DETECTED NOT DETECTED Final   Pseudomonas aeruginosa NOT DETECTED NOT DETECTED Final   Stenotrophomonas maltophilia NOT DETECTED NOT DETECTED Final   Candida albicans NOT DETECTED NOT DETECTED Final   Candida auris NOT DETECTED NOT DETECTED Final   Candida glabrata NOT DETECTED NOT DETECTED Final   Candida krusei NOT DETECTED NOT DETECTED Final   Candida parapsilosis NOT DETECTED NOT DETECTED Final   Candida tropicalis NOT DETECTED NOT DETECTED Final   Cryptococcus neoformans/gattii NOT DETECTED NOT DETECTED Final    Comment: Performed at [facility], [place], [place], [place] [address]  Urine culture     Status: Abnormal   Collection Time: 01/14/21  3:47 PM  Specimen: Urine, Catheterized  Result Value Ref Range Status   Specimen Description   Final    IN/OUT CATH URINE Performed at [facility], [address] [place], [place] [address]    Special Requests   Final    NONE Performed at [facility], [address] [place], [place] [address]    Culture >=100,000 COLONIES/mL ENTEROCOCCUS FAECALIS (A)  Final   Report Status 01/17/2021 FINAL  Final   Organism ID, Bacteria ENTEROCOCCUS FAECALIS (A)   Final      Susceptibility   Enterococcus faecalis - MIC*    AMPICILLIN <=2 SENSITIVE Sensitive     NITROFURANTOIN <=16 SENSITIVE Sensitive     VANCOMYCIN 1 SENSITIVE Sensitive     * >=100,000 COLONIES/mL ENTEROCOCCUS FAECALIS  Resp Panel by RT-PCR (Flu A&B, Covid) Nasopharyngeal Swab     Status: None   Collection Time: 01/14/21  3:47 PM   Specimen: Nasopharyngeal Swab; Nasopharyngeal(NP) swabs in vial transport medium  Result Value Ref Range Status   SARS Coronavirus 2 by RT PCR NEGATIVE NEGATIVE Final    Comment: (NOTE) SARS-CoV-2 target nucleic acids are NOT DETECTED.  The SARS-CoV-2 RNA is generally detectable in upper respiratory specimens during the acute phase of infection. The lowest concentration of SARS-CoV-2 viral copies this assay can detect is 138 copies/mL. A negative result does not preclude SARS-Cov-2 infection and should not be used as the sole basis for treatment or other patient management decisions. A negative result may occur with  improper specimen collection/handling, submission of specimen other than nasopharyngeal swab, presence of viral mutation(s) within the areas targeted by this assay, and inadequate number of viral copies(<138 copies/mL). A negative result must be combined with clinical observations, patient history, and epidemiological information. The expected result is Negative.  Fact Sheet for Patients:  EntrepreneurPulse.com.au  Fact Sheet for Healthcare Providers:  IncredibleEmployment.be  This test is no t yet approved or cleared by the [place] FDA and  has been authorized for detection and/or diagnosis of SARS-CoV-2 by FDA under an Emergency Use Authorization (EUA). This EUA will remain  in effect (meaning this test can be used) for the duration of the COVID-19 declaration under Section 564(b)(1) of the Act, 21 U.S.C.section 360bbb-3(b)(1), unless the authorization is terminated  or revoked sooner.        Influenza A by PCR NEGATIVE NEGATIVE Final   Influenza B by PCR NEGATIVE NEGATIVE Final    Comment: (NOTE) The Xpert Xpress SARS-CoV-2/FLU/RSV plus assay is intended as an aid in the diagnosis of influenza from Nasopharyngeal swab specimens and should not be used as a sole basis for treatment. Nasal washings and aspirates are unacceptable for Xpert Xpress SARS-CoV-2/FLU/RSV testing.  Fact Sheet for Patients: EntrepreneurPulse.com.au  Fact Sheet for Healthcare Providers: IncredibleEmployment.be  This test is not yet approved or cleared by the [place] FDA and has been authorized for detection and/or diagnosis of SARS-CoV-2 by FDA under an Emergency Use Authorization (EUA). This EUA will remain in effect (meaning this test can be used) for the duration of the COVID-19 declaration under Section 564(b)(1) of the Act, 21 U.S.C. section 360bbb-3(b)(1), unless the authorization is terminated or revoked.  Performed at [facility], [address] [place], [place] [address]   Blood Culture (routine x 2)     Status: Abnormal (Preliminary result)   Collection Time: 01/14/21  3:51 PM   Specimen: BLOOD LEFT ARM  Result Value Ref Range Status   Specimen  Description   Final    BLOOD LEFT ARM Performed at [facility], [address] [place], [place] [address]    Special Requests   Final    BOTTLES DRAWN AEROBIC AND ANAEROBIC Blood Culture adequate volume Performed at [facility], [address] [place], [place] [address]    Culture  Setup Time (A)  Final    GRAM VARIABLE ROD AEROBIC BOTTLE ONLY CRITICAL VALUE NOTED.  VALUE IS CONSISTENT WITH PREVIOUSLY REPORTED AND CALLED VALUE.    Culture (A)  Final    GRAM VARIABLE ROD SENT TO [facility] FOR IDENTIFICATION Performed at [facility], [place] [address] [place], [place] [address]    Report Status PENDING  Incomplete  Respiratory (~20  pathogens) panel by PCR     Status: None   Collection Time: 01/14/21  7:22 PM   Specimen: Nasopharyngeal Swab; Respiratory  Result Value Ref Range Status   Adenovirus NOT DETECTED NOT DETECTED Final   Coronavirus 229E NOT DETECTED NOT DETECTED Final    Comment: (NOTE) The Coronavirus on the Respiratory Panel, DOES NOT test for the novel  Coronavirus (2019 nCoV)    Coronavirus HKU1 NOT DETECTED NOT DETECTED Final   Coronavirus NL63 NOT DETECTED NOT DETECTED Final   Coronavirus OC43 NOT DETECTED NOT DETECTED Final   Metapneumovirus NOT DETECTED NOT DETECTED Final   Rhinovirus / Enterovirus NOT DETECTED NOT DETECTED Final   Influenza A NOT DETECTED NOT DETECTED Final   Influenza B NOT DETECTED NOT DETECTED Final   Parainfluenza Virus 1 NOT DETECTED NOT DETECTED Final   Parainfluenza Virus 2 NOT DETECTED NOT DETECTED Final   Parainfluenza Virus 3 NOT DETECTED NOT DETECTED Final   Parainfluenza Virus 4 NOT DETECTED NOT DETECTED Final   Respiratory Syncytial Virus NOT DETECTED NOT DETECTED Final   Bordetella pertussis NOT DETECTED NOT DETECTED Final   Bordetella Parapertussis NOT DETECTED NOT DETECTED Final   Chlamydophila pneumoniae NOT DETECTED NOT DETECTED Final   Mycoplasma pneumoniae NOT DETECTED NOT DETECTED Final    Comment: Performed at [facility], [place] [address] [place], [place] [address]  MRSA PCR Screening     Status: None   Collection Time: 01/14/21 11:15 PM   Specimen: Nasopharyngeal  Result Value Ref Range Status   MRSA by PCR NEGATIVE NEGATIVE Final    Comment:        The GeneXpert MRSA Assay (FDA approved for NASAL specimens only), is one component of a comprehensive MRSA colonization surveillance program. It is not intended to diagnose MRSA infection nor to guide or monitor treatment for MRSA infections. Performed at [facility], [place] [address] [place], [place] [address]   Culture, blood (routine x 2)     Status: None (Preliminary  result)   Collection Time: 01/16/21  9:43 AM   Specimen: BLOOD  Result Value Ref Range Status   Specimen Description   Final    BLOOD RIGHT ANTECUBITAL Performed at [facility] [address], [place], [place] [address]    Special Requests   Final    BOTTLES DRAWN AEROBIC AND ANAEROBIC Blood Culture adequate volume Performed at [facility] [address] [place], [place] [address]    Culture   Final    NO GROWTH < 24 HOURS Performed at [facility] [address], [place], [place] [address]    Report Status PENDING  Incomplete  Culture, blood (routine x 2)  Status: None (Preliminary result)   Collection Time: 01/16/21  9:51 AM   Specimen: BLOOD  Result Value Ref Range Status   Specimen Description   Final    BLOOD LEFT ANTECUBITAL Performed at [facility] [address], [place], [place] [address]    Special Requests   Final    BOTTLES DRAWN AEROBIC ONLY Blood Culture adequate volume Performed at [facility] [address] [place], [place] [address]    Culture   Final    NO GROWTH < 24 HOURS Performed at [facility] [address] [place], [place] [address]    Report Status PENDING  Incomplete    Imaging: I reviewed the patient's ultrasound which demonstrated no hydroureteronephrosis.  There was a distended bladder with the bladder catheter balloon readily apparent on the average.  Imp: The patient's catheter was not draining well at the time of the ultrasound, but we reevaluated and it appears to be draining quite well now.  Patient does have a history of urinary retention, the etiology of this is not clear to me.  I think it is worth starting the patient on tamsulosin to see if she empties her bladder more effectively while take the medication.  She also has a history of urinary tract infections.  This may be associated with her urinary retention.  At this time she is  being treated by infectious disease.  Recommendations: I recommend that the patient be started on tamsulosin.  I recommend that her Foley catheter be removed prior to discharge and that her post void residuals be documented.  If the patient is unable to get her postvoid residuals below 150 she may need a Foley catheter.  We will plan for her to follow-up with me in clinic the next 3 to 4 weeks.   Ardis Hughs

## 2021-01-18 NOTE — Progress Notes (Signed)
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  COVID screen test was negative.  On presentation patient was febrile, tachycardic.  Lab work showed hypokalemia, elevated troponin.  Patient was confused on admission.  Patient was admitted for the management of severe sepsis.ID following  Assessment & Plan:   Active Problems:   OBSTRUCTIVE SLEEP APNEA   Chronic pain syndrome   Hypogammaglobulinemia (HCC)   Left bundle branch block   Restless legs syndrome   Chronic kidney disease   Hypothyroidism   SIRS (systemic inflammatory response syndrome) (HCC)   Elevated troponin   Hypokalemia   Headache   Acute metabolic encephalopathy   Severe sepsis: Presented with fever with evidence of end organ damage, confusion, troponin elevation.  Elevated procalcitonin .started on IV antibiotics, IV fluids.    Patient has history of immunoglobulin deficiency. Chest x-ray, CT renal did not show any acute findings.  No clear source of sepsis at present.  Venous Doppler negative for DVT.  She has a port on the left chest.  Blood culture in the aerobic bottle now showing gram variable rod,not exactly identified.  Currently on cefepime, ID recommending to continue cefepime 01/29/21.  Repeat blood cultures have been sent,NGTD  C. difficile diarrhea,PCR positive: She was seen by GI as an outpatient few days ago for diarrhea and her C. difficile PCR was positive on 01/14/21.  Started on vancomycin here  since she has relapse of diarrhoea .  Does not complain of abdominal pain.  Acute metabolic encephalopathy: Most likely secondary to sepsis versus polypharmacy.  Much  improved, currently alert and oriented. she was on multiple psychoactive medications at home which includes baclofen, Suboxone, Vistaril, Lamictal, Remeron, Requip, Zanaflex.Some of  these are on hold.  CT head did not show any acute intracranial  abnormality.  Headache/back pain: Has history of migraines, has history of chronic back pain.  Continue supportive care.  Minimize sedatives.    No signs of CNS infection.  On sumatriptan  UTI: Denies any dysuria but urine cultures showing significant Enterococcus faecalis.   Elevated troponin: Most likely secondary to supply demand ischemia from type II MI.  Echocardiogram showed ejection fraction of 55 to 60%, indeterminate left ventricular diastolic parameters.  Cardiology consulted.  No plan for intervention. denies any chest pain.  Started on heparin by the admitting physician ,now discontinued   Has history of left bundle branch block.  EKG did not show signs of any ischemic changes  History of hypogammaglobinemia: History of common variable immune deficiency.  Followed by Dr. London Pepper with hematology at [facility] every 3 weeks as an outpatient.  GERD/history of gastric bypass: Continue Protonix  Leucopenia/Thrombocytopenia:   Continue to monitor  Hypothyroidism: Continue Synthyroid  Hypertension: Blood pressure still high,increased amlod to 10 mg daily  Urine retention:Started on Flomax,foley inserted,will give voiding trial when appropriate.  Ultrasound of the kidneys showed urine in the bladder even with Foley.  We will consult urology.  Generalized weakness/debility/deconditioning: Home health recommended after  PT/OT evaluation.  Patient can be transferred to telemetry.           DVT prophylaxis:SCD Code Status: Full Family Communication:: Discussed with the husband on phone on  01/17/21   Dispo: The patient is from: Home              Anticipated d/c is to: Home              Patient currently is  not medically stable to d/c.   Difficult to place patient No     Consultants: PCCM, cardiology  Procedures: None  Antimicrobials:  Anti-infectives (From admission, onward)   Start     Dose/Rate Route Frequency Ordered Stop   01/17/21 1445  ceFEPIme (MAXIPIME) 2 g in sodium chloride 0.9 % 100 mL IVPB        2 g 200 mL/hr over 30 Minutes Intravenous Every 12 hours 01/17/21 1356     01/17/21 1400  vancomycin (VANCOCIN) capsule 125 mg        125 mg Oral 4 times daily 01/17/21 1203 01/27/21 1359   01/15/21 1815  vancomycin (VANCOCIN) capsule 125 mg  Status:  Discontinued        125 mg Oral 4 times daily 01/15/21 1718 01/16/21 1145   01/15/21 1800  vancomycin (VANCOCIN) 50 mg/mL oral solution 125 mg  Status:  Discontinued        125 mg Oral Every 6 hours 01/15/21 1654 01/15/21 1717   01/15/21 1700  vancomycin (VANCOREADY) IVPB 750 mg/150 mL  Status:  Discontinued        750 mg 150 mL/hr over 60 Minutes Intravenous Every 24 hours 01/14/21 1931 01/15/21 1344   01/15/21 0600  piperacillin-tazobactam (ZOSYN) IVPB 3.375 g  Status:  Discontinued        3.375 g 12.5 mL/hr over 240 Minutes Intravenous Every 8 hours 01/14/21 1931 01/17/21 1356   01/15/21 0000  doxycycline (VIBRAMYCIN) 100 mg in sodium chloride 0.9 % 250 mL IVPB  Status:  Discontinued        100 mg 125 mL/hr over 120 Minutes Intravenous Every 12 hours 01/14/21 2310 01/15/21 1344   01/14/21 1930  piperacillin-tazobactam (ZOSYN) IVPB 3.375 g        3.375 g 100 mL/hr over 30 Minutes Intravenous  Once 01/14/21 1918 01/14/21 2005   01/14/21 1915  aztreonam (AZACTAM) 2 g in sodium chloride 0.9 % 100 mL IVPB  Status:  Discontinued        2 g 200 mL/hr over 30 Minutes Intravenous  Once 01/14/21 1901 01/14/21 1918   01/14/21 1915  metroNIDAZOLE (FLAGYL) IVPB 500 mg  Status:  Discontinued        500 mg 100 mL/hr over 60 Minutes Intravenous  Once 01/14/21 1901 01/14/21 1917   01/14/21 1915  vancomycin (VANCOCIN) IVPB 1000 mg/200 mL  premix        1,000 mg 200 mL/hr over 60 Minutes Intravenous  Once 01/14/21 1901 01/14/21 2053      Subjective:  Patient seen and examined the bedside this morning.  Hemodynamically stable.  Complains of persistent diarrhea.  Complains of bladder pain today.  Denies any headache or back pain today.  Objective: Vitals:   01/18/21 0000 01/18/21 0200 01/18/21 0300 01/18/21 0400  BP: (!) 140/49 (!) 145/44  (!) 151/63  Pulse: 79 76  74  Resp: 16 17  20   Temp:   98.7 F (37.1 C)   TempSrc:   Oral   SpO2: 100% 99%  100%  Weight:      Height:        Intake/Output Summary (Last 24 hours) at 01/18/2021 0744 Last data filed at 01/18/2021 0500 Gross per 24 hour  Intake 1581.54 ml  Output 1800 ml  Net -218.46 ml   Filed Weights   01/14/21 1437  Weight: 52.2 kg    Examination:   General exam: Chronically ill looking, not in distress, weak HEENT: PERRL Respiratory system:  no wheezes or crackles  Cardiovascular system: S1 & S2 heard, RRR.  Chemo-Port on the left chest Gastrointestinal system: Abdomen is nondistended, soft and nontender. Central nervous system: Alert and oriented Extremities: No edema, no clubbing ,no cyanosis Skin: No rashes, no ulcers,no icterus    Data Reviewed: I have personally reviewed following labs and imaging studies  CBC: Recent Labs  Lab 01/11/21 1214 01/14/21 1547 01/15/21 0500 01/16/21 0538 01/17/21 0628 01/18/21 0500  WBC 4.9 6.0 6.4 2.2* 3.1* 2.6*  NEUTROABS 3.2 5.6 6.0  --   --   --   HGB 13.7 13.6 12.9 10.9* 11.1* 11.5*  HCT 42.3 40.4 38.3 33.9* 32.7* 34.6*  MCV 90.8 88.6 91.0 92.1 89.3 89.2  PLT 165 120* 105* 73* 124* 672*   Basic Metabolic Panel: Recent Labs  Lab 01/14/21 1547 01/14/21 2158 01/15/21 0030 01/15/21 0500 01/16/21 0538 01/17/21 0628 01/18/21 0500  NA 135 138  --  135 137 138 138  K 2.8* 2.9*  --  4.0 3.5 2.9* 3.7  CL 101 104  --  106 109 112* 112*  CO2 23 25  --  25 22 20* 21*  GLUCOSE 136* 142*  --  121* 69*  85 98  BUN 23 19  --  15 16 16 8   CREATININE 0.81 0.83  --  0.65 0.74 0.78 0.42*  CALCIUM 8.9 8.6*  --  8.1* 7.6* 7.6* 7.6*  MG 1.4*  --   --  3.0*  --   --   --   PHOS  --   --  3.2 2.8  --   --   --    GFR: Estimated Creatinine Clearance: 52.4 mL/min (A) (by C-G formula based on SCr of 0.42 mg/dL (L)). Liver Function Tests: Recent Labs  Lab 01/11/21 1214 01/14/21 1547 01/15/21 0500  AST 39 33 33  ALT 36 29 29  ALKPHOS 64 72 56  BILITOT 0.5 0.4 0.4  PROT 8.0 7.2 6.5  ALBUMIN 3.6 3.4* 2.9*   No results for input(s): LIPASE, AMYLASE in the last 168 hours. No results for input(s): AMMONIA in the last 168 hours. Coagulation Profile: Recent Labs  Lab 01/14/21 1547  INR 0.9   Cardiac Enzymes: Recent Labs  Lab 01/15/21 0030  CKTOTAL 78   BNP (last 3 results) No results for input(s): PROBNP in the last 8760 hours. HbA1C: No results for input(s): HGBA1C in the last 72 hours. CBG: Recent Labs  Lab 01/14/21 2201 01/15/21 1550  GLUCAP 142* 97   Lipid Profile: No results for input(s): CHOL, HDL, LDLCALC, TRIG, CHOLHDL, LDLDIRECT in the last 72 hours. Thyroid Function Tests: No results for input(s): TSH, T4TOTAL, FREET4, T3FREE, THYROIDAB in the last 72 hours. Anemia Panel: No results for input(s): VITAMINB12, FOLATE, FERRITIN, TIBC, IRON, RETICCTPCT in the last 72 hours. Sepsis Labs: Recent Labs  Lab 01/14/21 1547 01/14/21 1755 01/14/21 1922 01/14/21 2158 01/16/21 0538  PROCALCITON  --   --  3.55 4.97 3.98  LATICACIDVEN 1.3 1.0  --   --   --     Recent Results (from the past 240 hour(s))  Clostridium Difficile by PCR     Status: Abnormal   Collection Time: 01/14/21 10:00 AM   Specimen: STOOL  Stool  Result Value Ref Range Status   Toxigenic C. Difficile by PCR Positive (A) Negative Final    Comment: Toxigenic C difficile: Positive Epidemic Strain Bl/NAP1/027: Presumptive Negative   Blood Culture (routine x 2)     Status: Abnormal (Preliminary result)    Collection Time: 01/14/21  3:46 PM   Specimen: BLOOD  Result Value Ref Range Status   Specimen Description   Final    BLOOD LEFT CHEST PORTA CATH Performed at [facility], [address] [place], [place] [address]    Special Requests   Final    BOTTLES DRAWN AEROBIC AND ANAEROBIC Blood Culture adequate volume Performed at [facility], [address] [place], [place] [address]    Culture  Setup Time (A)  Final    GRAM VARIABLE ROD AEROBIC BOTTLE ONLY CRITICAL RESULT CALLED TO, READ BACK BY AND VERIFIED WITH: PHRMD J LEGGE [id number] AT 1059 AM BY CM    Culture (A)  Final    GRAM VARIABLE ROD IDENTIFICATION AND SUSCEPTIBILITIES TO FOLLOW Performed at [facility], [place] [address] [place], [place] [address]    Report Status PENDING  Incomplete  Blood Culture ID Panel (Reflexed)     Status: None   Collection Time: 01/14/21  3:46 PM  Result Value Ref Range Status   Enterococcus faecalis NOT DETECTED NOT DETECTED Final   Enterococcus Faecium NOT DETECTED NOT DETECTED Final   Listeria monocytogenes NOT DETECTED NOT DETECTED Final   Staphylococcus species NOT DETECTED NOT DETECTED Final   Staphylococcus aureus (BCID) NOT DETECTED NOT DETECTED Final   Staphylococcus epidermidis NOT DETECTED NOT DETECTED Final   Staphylococcus lugdunensis NOT DETECTED NOT DETECTED Final   Streptococcus species NOT DETECTED NOT DETECTED Final   Streptococcus agalactiae NOT DETECTED NOT DETECTED Final   Streptococcus pneumoniae NOT DETECTED NOT DETECTED Final   Streptococcus pyogenes NOT DETECTED NOT DETECTED Final   A.calcoaceticus-baumannii NOT DETECTED NOT DETECTED Final   Bacteroides fragilis NOT DETECTED NOT DETECTED Final   Enterobacterales NOT DETECTED NOT DETECTED Final   Enterobacter cloacae complex NOT DETECTED NOT DETECTED Final   Escherichia coli NOT DETECTED NOT DETECTED Final   Klebsiella aerogenes NOT DETECTED NOT DETECTED Final   Klebsiella oxytoca NOT  DETECTED NOT DETECTED Final   Klebsiella pneumoniae NOT DETECTED NOT DETECTED Final   Proteus species NOT DETECTED NOT DETECTED Final   Salmonella species NOT DETECTED NOT DETECTED Final   Serratia marcescens NOT DETECTED NOT DETECTED Final   Haemophilus influenzae NOT DETECTED NOT DETECTED Final   Neisseria meningitidis NOT DETECTED NOT DETECTED Final   Pseudomonas aeruginosa NOT DETECTED NOT DETECTED Final   Stenotrophomonas maltophilia NOT DETECTED NOT DETECTED Final   Candida albicans NOT DETECTED NOT DETECTED Final   Candida auris NOT DETECTED NOT DETECTED Final   Candida glabrata NOT DETECTED NOT DETECTED Final   Candida krusei NOT DETECTED NOT DETECTED Final   Candida parapsilosis NOT DETECTED NOT DETECTED Final   Candida tropicalis NOT DETECTED NOT DETECTED Final   Cryptococcus neoformans/gattii NOT DETECTED NOT DETECTED Final    Comment: Performed at [facility], [place], [place],  [address]  Urine culture     Status: Abnormal   Collection Time: 01/14/21  3:47 PM   Specimen: Urine, Catheterized  Result Value Ref Range Status   Specimen Description   Final    IN/OUT CATH URINE Performed at [facility], [address] [place], [place]  [address]    Special Requests   Final    NONE Performed at [facility], [address] [place], [place] [address]    Culture >=100,000 COLONIES/mL ENTEROCOCCUS FAECALIS (A)  Final   Report Status 01/17/2021 FINAL  Final   Organism ID, Bacteria ENTEROCOCCUS FAECALIS (A)  Final      Susceptibility   Enterococcus faecalis - MIC*    AMPICILLIN <=2 SENSITIVE Sensitive     NITROFURANTOIN <=16 SENSITIVE Sensitive     VANCOMYCIN 1 SENSITIVE Sensitive     * >=100,000 COLONIES/mL ENTEROCOCCUS FAECALIS  Resp Panel by RT-PCR (Flu A&B, Covid) Nasopharyngeal Swab     Status: None   Collection Time: 01/14/21  3:47 PM   Specimen: Nasopharyngeal Swab; Nasopharyngeal(NP) swabs in vial transport medium  Result  Value Ref Range Status   SARS Coronavirus 2 by RT PCR NEGATIVE NEGATIVE Final    Comment: (NOTE) SARS-CoV-2 target nucleic acids are NOT DETECTED.  The SARS-CoV-2 RNA is generally detectable in upper respiratory specimens during the acute phase of infection. The lowest concentration of SARS-CoV-2 viral copies this assay can detect is 138 copies/mL. A negative result does not preclude SARS-Cov-2 infection and should not be used as the sole basis for treatment or other patient management decisions. A negative result may occur with  improper specimen collection/handling, submission of specimen other than nasopharyngeal swab, presence of viral mutation(s) within the areas targeted by this assay, and inadequate number of viral copies(<138 copies/mL). A negative result must be combined with clinical observations, patient history, and epidemiological information. The expected result is Negative.  Fact Sheet for Patients:  EntrepreneurPulse.com.au  Fact Sheet for Healthcare Providers:  IncredibleEmployment.be  This test is no t yet approved or cleared by the [place] FDA and  has been authorized for detection and/or diagnosis of SARS-CoV-2 by FDA under an Emergency Use Authorization (EUA). This EUA will remain  in effect (meaning this test can be used) for the duration of the COVID-19 declaration under Section 564(b)(1) of the Act, 21 U.S.C.section 360bbb-3(b)(1), unless the authorization is terminated  or revoked sooner.       Influenza A by PCR NEGATIVE NEGATIVE Final   Influenza B by PCR NEGATIVE NEGATIVE Final    Comment: (NOTE) The Xpert Xpress SARS-CoV-2/FLU/RSV plus assay is intended as an aid in the diagnosis of influenza from Nasopharyngeal swab specimens and should not be used as a sole basis for treatment. Nasal washings and aspirates are unacceptable for Xpert Xpress SARS-CoV-2/FLU/RSV testing.  Fact Sheet for  Patients: EntrepreneurPulse.com.au  Fact Sheet for Healthcare Providers: IncredibleEmployment.be  This test is not yet approved or cleared by the [place] FDA and has been authorized for detection and/or diagnosis of SARS-CoV-2 by FDA under an Emergency Use Authorization (EUA). This EUA will remain in effect (meaning this test can be used) for the duration of the COVID-19 declaration under Section 564(b)(1) of the Act, 21 U.S.C. section 360bbb-3(b)(1), unless the authorization is terminated or revoked.  Performed at [facility], [address] [place], [place] [address]   Blood Culture (routine x 2)     Status: Abnormal (Preliminary result)   Collection Time: 01/14/21  3:51 PM   Specimen: BLOOD LEFT ARM  Result Value Ref Range Status   Specimen Description   Final    BLOOD LEFT ARM Performed at [facility], [address], [place], [place] [address]    Special Requests   Final  BOTTLES DRAWN AEROBIC AND ANAEROBIC Blood Culture adequate volume Performed at [facility] [address] [place], [place] [address]    Culture  Setup Time (A)  Final    GRAM VARIABLE ROD AEROBIC BOTTLE ONLY CRITICAL VALUE NOTED.  VALUE IS CONSISTENT WITH PREVIOUSLY REPORTED AND CALLED VALUE.    Culture (A)  Final    GRAM VARIABLE ROD IDENTIFICATION TO FOLLOW Performed at [facility], [place] [address], [place], [place] [address]    Report Status PENDING  Incomplete  Respiratory (~20 pathogens) panel by PCR     Status: None   Collection Time: 01/14/21  7:22 PM   Specimen: Nasopharyngeal Swab; Respiratory  Result Value Ref Range Status   Adenovirus NOT DETECTED NOT DETECTED Final   Coronavirus 229E NOT DETECTED NOT DETECTED Final    Comment: (NOTE) The Coronavirus on the Respiratory Panel, DOES NOT test for the novel  Coronavirus (2019 nCoV)    Coronavirus HKU1 NOT DETECTED NOT DETECTED Final   Coronavirus  NL63 NOT DETECTED NOT DETECTED Final   Coronavirus OC43 NOT DETECTED NOT DETECTED Final   Metapneumovirus NOT DETECTED NOT DETECTED Final   Rhinovirus / Enterovirus NOT DETECTED NOT DETECTED Final   Influenza A NOT DETECTED NOT DETECTED Final   Influenza B NOT DETECTED NOT DETECTED Final   Parainfluenza Virus 1 NOT DETECTED NOT DETECTED Final   Parainfluenza Virus 2 NOT DETECTED NOT DETECTED Final   Parainfluenza Virus 3 NOT DETECTED NOT DETECTED Final   Parainfluenza Virus 4 NOT DETECTED NOT DETECTED Final   Respiratory Syncytial Virus NOT DETECTED NOT DETECTED Final   Bordetella pertussis NOT DETECTED NOT DETECTED Final   Bordetella Parapertussis NOT DETECTED NOT DETECTED Final   Chlamydophila pneumoniae NOT DETECTED NOT DETECTED Final   Mycoplasma pneumoniae NOT DETECTED NOT DETECTED Final    Comment: Performed at [facility], [place] [address], [place], [place] [address]  MRSA PCR Screening     Status: None   Collection Time: 01/14/21 11:15 PM   Specimen: Nasopharyngeal  Result Value Ref Range Status   MRSA by PCR NEGATIVE NEGATIVE Final    Comment:        The GeneXpert MRSA Assay (FDA approved for NASAL specimens only), is one component of a comprehensive MRSA colonization surveillance program. It is not intended to diagnose MRSA infection nor to guide or monitor treatment for MRSA infections. Performed at [facility], [place] [address] [place], [place] [address]   Culture, blood (routine x 2)     Status: None (Preliminary result)   Collection Time: 01/16/21  9:43 AM   Specimen: BLOOD  Result Value Ref Range Status   Specimen Description   Final    BLOOD RIGHT ANTECUBITAL Performed at [place] [address] [place], [place] [address]    Special Requests   Final    BOTTLES DRAWN AEROBIC AND ANAEROBIC Blood Culture adequate volume Performed at [place] [address] [place], [place] [address]     Culture   Final    NO GROWTH < 24 HOURS Performed at [facility] [place] [address] [place], [place] [address]    Report Status PENDING  Incomplete  Culture, blood (routine x 2)     Status: None (Preliminary result)   Collection Time: 01/16/21  9:51 AM   Specimen: BLOOD  Result Value Ref Range Status   Specimen Description   Final    BLOOD LEFT  ANTECUBITAL Performed at [facility], [place] [address] [place], [place] [address]    Special Requests   Final    BOTTLES DRAWN AEROBIC ONLY Blood Culture adequate volume Performed at [place] [address], [place], [place] [address]    Culture   Final    NO GROWTH < 24 HOURS Performed at [facility] [address] [place], [place] [address]    Report Status PENDING  Incomplete         Radiology Studies: US RENAL  Result Date: 01/17/2021 CLINICAL DATA:  Urinary retention EXAM: RENAL / URINARY TRACT ULTRASOUND COMPLETE COMPARISON:  CT abdomen and pelvis Jan 14, 2021 FINDINGS: Right Kidney: Renal measurements: 9.1 x 3.9 x 4.2 cm = volume: 77.6 mL. Echogenicity and renal cortical thickness are within normal limits. No mass, perinephric fluid, or hydronephrosis visualized. No sonographically demonstrable calculus or ureterectasis. Left Kidney: Renal measurements: 9.6 x 4.7 x 5.8 cm = volume: 135.8 mL. Echogenicity and renal cortical thickness are within normal limits. No mass, perinephric fluid, or hydronephrosis visualized. No sonographically demonstrable calculus or ureterectasis. Bladder: Appears normal for degree of bladder distention. Note that there is a Foley catheter in the bladder. There is still significant volume of urine in the bladder. Flow from each distal ureter seen within the bladder. Other: Mild ascites noted. IMPRESSION: Normal appearing kidneys bilaterally. There is moderate urine in the bladder with Foley catheter in place. No bladder wall thickening, focal urinary bladder  lesion appreciable by ultrasound. Flow from each distal ureter seen within the bladder. Mild ascites noted. Electronically Signed   By: Lowella Grip III M.D.   On: 01/17/2021 20:20        Scheduled Meds: . amLODipine  10 mg Oral Daily  . Chlorhexidine Gluconate Cloth  6 each Topical Daily  . lamoTRIgine  200 mg Oral Daily  . levothyroxine  75 mcg Oral Q0600  . mouth rinse  15 mL Mouth Rinse BID  . mirtazapine  15 mg Oral QHS   Followed by  . [START ON 01/24/2021] mirtazapine  30 mg Oral QHS  . oxybutynin  2.5 mg Oral BID  . pantoprazole  40 mg Oral Daily  . sodium chloride flush  10-40 mL Intracatheter Q12H  . tamsulosin  0.4 mg Oral Daily  . vancomycin  125 mg Oral QID   Continuous Infusions: . sodium chloride 75 mL/hr at 01/18/21 0500  . ceFEPime (MAXIPIME) IV Stopped (01/17/21 2220)     LOS: 2 days    Time spent:25 mins. More than 50% of that time was spent in counseling and/or coordination of care.      Shelly Coss, MD [facility] P6/11/2020, 7:44 AM

## 2021-01-19 LAB — CBC
HCT: 32.5 % — ABNORMAL LOW (ref 36.0–46.0)
Hemoglobin: 10.8 g/dL — ABNORMAL LOW (ref 12.0–15.0)
MCH: 29.8 pg (ref 26.0–34.0)
MCHC: 33.2 g/dL (ref 30.0–36.0)
MCV: 89.8 fL (ref 80.0–100.0)
Platelets: 133 10*3/uL — ABNORMAL LOW (ref 150–400)
RBC: 3.62 MIL/uL — ABNORMAL LOW (ref 3.87–5.11)
RDW: 13.4 % (ref 11.5–15.5)
WBC: 4.4 10*3/uL (ref 4.0–10.5)
nRBC: 0 % (ref 0.0–0.2)

## 2021-01-19 NOTE — Progress Notes (Signed)
Physical Therapy Treatment Patient Details Name: Megan Brewer MRN: [id number] DOB: [birth year] Today's Date: 01/19/2021    History of Present Illness Patient is a 75 year old female generalized fatigue chills tachycardia febrile. PMH includes lung cancer sp ressection, immunoglobulin deficiency (PTA Gammagard infusions), recurrent UTI  Sp Gastric bipass 8-10 y ago    PT Comments    Pt walked a short distance from bathroom to bed. She declined hallway ambulation 2* fatigue. Encouraged her to try to walk halls with nursing later today if possible.    Follow Up Recommendations  Home health PT     Equipment Recommendations  None recommended by PT    Recommendations for Other Services       Precautions / Restrictions Precautions Precautions: Fall Precaution Comments: Droplet/Enteric Restrictions Weight Bearing Restrictions: No    Mobility  Bed Mobility Overal bed mobility: Needs Assistance Bed Mobility: Sit to Supine       Sit to supine: Supervision        Transfers Overall transfer level: Needs assistance Equipment used: Rolling walker (2 wheeled) Transfers: Sit to/from Stand Sit to Stand: Min guard            Ambulation/Gait Ambulation/Gait assistance: Min Web designer (Feet): 15 Feet Assistive device: None Gait Pattern/deviations: Step-through pattern;Decreased stride length     General Gait Details: Pt walked from bathroom to bed without a device. She is unsteady. Gait is guarded and slow. Pt declined hallway ambulation on today   Stairs             Wheelchair Mobility    Modified Rankin (Stroke Patients Only)       Balance Overall balance assessment: Needs assistance           Standing balance-Leahy Scale: Fair                              Cognition Arousal/Alertness: Awake/alert Behavior During Therapy: WFL for tasks assessed/performed Overall Cognitive Status: Within Functional Limits for tasks  assessed                                        Exercises      General Comments        Pertinent Vitals/Pain Pain Assessment: No/denies pain    Home Living                      Prior Function            PT Goals (current goals can now be found in the care plan section) Progress towards PT goals: Progressing toward goals    Frequency    Min 3X/week      PT Plan Current plan remains appropriate    Co-evaluation              AM-PAC PT "6 Clicks" Mobility   Outcome Measure  Help needed turning from your back to your side while in a flat bed without using bedrails?: None Help needed moving from lying on your back to sitting on the side of a flat bed without using bedrails?: None Help needed moving to and from a bed to a chair (including a wheelchair)?: A Little Help needed standing up from a chair using your arms (e.g., wheelchair or bedside chair)?: A Little Help needed to walk in hospital room?: A  Little Help needed climbing 3-5 steps with a railing? : A Lot 6 Click Score: 19    End of Session   Activity Tolerance: Patient limited by fatigue Patient left: in bed;with call bell/phone within reach   PT Visit Diagnosis: Muscle weakness (generalized) (M62.81);Difficulty in walking, not elsewhere classified (R26.2)     Time: 1300-1315 PT Time Calculation (min) (ACUTE ONLY): 15 min  Charges:  $Gait Training: 8-22 mins              Doreatha Massed, PT Acute Rehabilitation  Office: [phone] Pager: [phone]

## 2021-01-19 NOTE — Progress Notes (Signed)
Foley d/c'ed at 1030. Patient voided 600 mL at 5:30pm. Reports she felt she emptied her bladder and denies abdominal discomfort. Denies dysuria. Bladder scanner revealed 110 mL of urine.   Barbee Shropshire. Brigitte Pulse, RN

## 2021-01-19 NOTE — Progress Notes (Signed)
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  COVID screen test was negative.  On presentation patient was febrile, tachycardic.  Lab work showed hypokalemia, elevated troponin.  Patient was confused on admission.  Patient was admitted for the management of severe sepsis.ID following  Assessment & Plan:   Active Problems:   OBSTRUCTIVE SLEEP APNEA   Chronic pain syndrome   Hypogammaglobulinemia (HCC)   Left bundle branch block   Restless legs syndrome   Chronic kidney disease   Hypothyroidism   SIRS (systemic inflammatory response syndrome) (HCC)   Elevated troponin   Hypokalemia   Headache   Acute metabolic encephalopathy   Severe sepsis: Presented with fever with evidence of end organ damage, confusion, troponin elevation.  Elevated procalcitonin .started on IV antibiotics, IV fluids.    Patient has history of immunoglobulin deficiency. Chest x-ray, CT renal did not show any acute findings.  No clear source of sepsis at present.  Venous Doppler negative for DVT.  She has a port on the left chest.  Blood culture in the aerobic bottle now showing gram variable rod,not exactly identified.  Currently on cefepime, ID recommending to continue cefepime 01/29/21.  Repeat blood cultures have been sent,NGTD  C. difficile diarrhea,PCR positive: She was seen by GI as an outpatient few days ago for diarrhea and her C. difficile PCR was positive on 01/14/21.  Started on vancomycin here  since she has relapse of diarrhoea .  Does not complain of abdominal pain.  Had 2 loose stools this morning, frequency of diarrhea is improving.  Acute metabolic  encephalopathy: Most likely secondary to sepsis versus polypharmacy.  Much improved, currently alert and oriented. she was on multiple psychoactive medications at home which includes baclofen, Suboxone, Vistaril, Lamictal, Remeron, Requip, Zanaflex.Some of  these are on hold.  CT head did not show any acute intracranial  abnormality.  Headache/back pain: Has history of migraines, has history of chronic back pain.  Continue supportive care.  Minimize sedatives.    No signs of CNS infection.  On sumatriptan as needed.  Given some doses of migraine cocktail.  UTI: Denies any dysuria but urine culture showing significant Enterococcus faecalis, treated with Zosyn.  Elevated troponin: Most likely secondary to supply demand ischemia from type II MI.  Echocardiogram showed ejection fraction of 55 to 60%, indeterminate left ventricular diastolic parameters.  Cardiology consulted.  No plan for intervention. denies any chest pain.  Started on heparin by the admitting physician ,now discontinued   Has history of left bundle branch block.  EKG did not show signs of any ischemic changes  History of hypogammaglobinemia: History of common variable immune deficiency.  Followed by Dr. London Pepper with hematology at [facility] every 3 weeks as an outpatient.  GERD/history of gastric bypass: Continue Protonix  Leucopenia/Thrombocytopenia:   Continue to monitor  Hypothyroidism: Continue Synthyroid  Hypertension: Blood pressure stable on amlod 10 mg daily  Urine retention:Started on Flomax,foley inserted. Ultrasound of the kidneys showed urine in the bladder even with Foley.  Urology consulted, recommended to continue tamsulosin and follow-up as an outpatient.  She is history of chronic urinary retention.  Will give voiding trial, if she continues to retain,  she will be discharged on Foley catheter.  Generalized weakness/debility/deconditioning: Home health recommended after  PT/OT  evaluation.           DVT prophylaxis:SCD Code Status: Full Family Communication:: Discussed with the husband on phone on 01/17/21   Dispo: The patient is from: Home              Anticipated d/c is to: Home              Patient currently is not medically stable to d/c.   Difficult to place patient No     Consultants: PCCM, cardiology  Procedures: None  Antimicrobials:  Anti-infectives (From admission, onward)   Start     Dose/Rate Route Frequency Ordered Stop   01/17/21 1445  ceFEPIme (MAXIPIME) 2 g in sodium chloride 0.9 % 100 mL IVPB        2 g 200 mL/hr over 30 Minutes Intravenous Every 12 hours 01/17/21 1356     01/17/21 1400  vancomycin (VANCOCIN) capsule 125 mg        125 mg Oral 4 times daily 01/17/21 1203 01/27/21 1359   01/15/21 1815  vancomycin (VANCOCIN) capsule 125 mg  Status:  Discontinued        125 mg Oral 4 times daily 01/15/21 1718 01/16/21 1145   01/15/21 1800  vancomycin (VANCOCIN) 50 mg/mL oral solution 125 mg  Status:  Discontinued        125 mg Oral Every 6 hours 01/15/21 1654 01/15/21 1717   01/15/21 1700  vancomycin (VANCOREADY) IVPB 750 mg/150 mL  Status:  Discontinued        750 mg 150 mL/hr over 60 Minutes Intravenous Every 24 hours 01/14/21 1931 01/15/21 1344   01/15/21 0600  piperacillin-tazobactam (ZOSYN) IVPB 3.375 g  Status:  Discontinued        3.375 g 12.5 mL/hr over 240 Minutes Intravenous Every 8 hours 01/14/21 1931 01/17/21 1356   01/15/21 0000  doxycycline (VIBRAMYCIN) 100 mg in sodium chloride 0.9 % 250 mL IVPB  Status:  Discontinued        100 mg 125 mL/hr over 120 Minutes Intravenous Every 12 hours 01/14/21 2310 01/15/21 1344   01/14/21 1930  piperacillin-tazobactam (ZOSYN) IVPB 3.375 g        3.375 g 100 mL/hr over 30 Minutes Intravenous  Once 01/14/21 1918 01/14/21 2005   01/14/21 1915  aztreonam (AZACTAM) 2 g in sodium chloride 0.9 % 100 mL IVPB  Status:  Discontinued        2 g 200 mL/hr over 30 Minutes Intravenous  Once  01/14/21 1901 01/14/21 1918   01/14/21 1915  metroNIDAZOLE (FLAGYL) IVPB 500 mg  Status:  Discontinued        500 mg 100 mL/hr over 60 Minutes Intravenous  Once 01/14/21 1901 01/14/21 1917   01/14/21 1915  vancomycin (VANCOCIN) IVPB 1000 mg/200 mL premix        1,000 mg 200 mL/hr over 60 Minutes Intravenous  Once 01/14/21 1901 01/14/21 2053      Subjective:  Patient seen and examined at bedside this morning.  Hemodynamically stable.  She looks much better today.  Denies any new complaints.  Frequency of diarrhea is improving, had 2 episodes of loose stool this morning   Objective: Vitals:   01/18/21 0823 01/18/21 0948 01/18/21 1000 01/18/21 1138  BP: (!) 168/68 (!) 135/48 (!) 142/68 124/60  Pulse:   94 97  Resp:    (!) 21  Temp:    98.4  F (36.9 C)  TempSrc:    Oral  SpO2:   100% 95%  Weight:      Height:        Intake/Output Summary (Last 24 hours) at 01/19/2021 0814 Last data filed at 01/19/2021 4403 Gross per 24 hour  Intake 1084.97 ml  Output 1600 ml  Net -515.03 ml   Filed Weights   01/14/21 1437  Weight: 52.2 kg    Examination:   General exam: Chronically ill looking, not in distress, weak HEENT: PERRL Respiratory system:  no wheezes or crackles  Cardiovascular system: S1 & S2 heard, RRR.  Chemo-Port on the left chest Gastrointestinal system: Abdomen is nondistended, soft and nontender. Central nervous system: Alert and oriented Extremities: No edema, no clubbing ,no cyanosis Skin: No rashes, no ulcers,no icterus    Data Reviewed: I have personally reviewed following labs and imaging studies  CBC: Recent Labs  Lab 01/14/21 1547 01/15/21 0500 01/16/21 0538 01/17/21 0628 01/18/21 0500 01/19/21 0339  WBC 6.0 6.4 2.2* 3.1* 2.6* 4.4  NEUTROABS 5.6 6.0  --   --   --   --   HGB 13.6 12.9 10.9* 11.1* 11.5* 10.8*  HCT 40.4 38.3 33.9* 32.7* 34.6* 32.5*  MCV 88.6 91.0 92.1 89.3 89.2 89.8  PLT 120* 105* 73* 124* 128* 474*   Basic Metabolic Panel: Recent  Labs  Lab 01/14/21 1547 01/14/21 2158 01/15/21 0030 01/15/21 0500 01/16/21 0538 01/17/21 0628 01/18/21 0500  NA 135 138  --  135 137 138 138  K 2.8* 2.9*  --  4.0 3.5 2.9* 3.7  CL 101 104  --  106 109 112* 112*  CO2 23 25  --  25 22 20* 21*  GLUCOSE 136* 142*  --  121* 69* 85 98  BUN 23 19  --  15 16 16 8   CREATININE 0.81 0.83  --  0.65 0.74 0.78 0.42*  CALCIUM 8.9 8.6*  --  8.1* 7.6* 7.6* 7.6*  MG 1.4*  --   --  3.0*  --   --   --   PHOS  --   --  3.2 2.8  --   --   --    GFR: Estimated Creatinine Clearance: 52.4 mL/min (A) (by C-G formula based on SCr of 0.42 mg/dL (L)). Liver Function Tests: Recent Labs  Lab 01/14/21 1547 01/15/21 0500  AST 33 33  ALT 29 29  ALKPHOS 72 56  BILITOT 0.4 0.4  PROT 7.2 6.5  ALBUMIN 3.4* 2.9*   No results for input(s): LIPASE, AMYLASE in the last 168 hours. No results for input(s): AMMONIA in the last 168 hours. Coagulation Profile: Recent Labs  Lab 01/14/21 1547  INR 0.9   Cardiac Enzymes: Recent Labs  Lab 01/15/21 0030  CKTOTAL 78   BNP (last 3 results) No results for input(s): PROBNP in the last 8760 hours. HbA1C: No results for input(s): HGBA1C in the last 72 hours. CBG: Recent Labs  Lab 01/14/21 2201 01/15/21 1550  GLUCAP 142* 97   Lipid Profile: No results for input(s): CHOL, HDL, LDLCALC, TRIG, CHOLHDL, LDLDIRECT in the last 72 hours. Thyroid Function Tests: No results for input(s): TSH, T4TOTAL, FREET4, T3FREE, THYROIDAB in the last 72 hours. Anemia Panel: No results for input(s): VITAMINB12, FOLATE, FERRITIN, TIBC, IRON, RETICCTPCT in the last 72 hours. Sepsis Labs: Recent Labs  Lab 01/14/21 1547 01/14/21 1755 01/14/21 1922 01/14/21 2158 01/16/21 0538  PROCALCITON  --   --  3.55 4.97 3.98  LATICACIDVEN 1.3 1.0  --   --   --  Recent Results (from the past 240 hour(s))  Clostridium Difficile by PCR     Status: Abnormal   Collection Time: 01/14/21 10:00 AM   Specimen: STOOL   Stool  Result Value  Ref Range Status   Toxigenic C. Difficile by PCR Positive (A) Negative Final    Comment: Toxigenic C difficile: Positive Epidemic Strain Bl/NAP1/027: Presumptive Negative   Blood Culture (routine x 2)     Status: Abnormal (Preliminary result)   Collection Time: 01/14/21  3:46 PM   Specimen: BLOOD  Result Value Ref Range Status   Specimen Description   Final    BLOOD LEFT CHEST PORTA CATH Performed at [facility], [address] [place], [place] [address]    Special Requests   Final    BOTTLES DRAWN AEROBIC AND ANAEROBIC Blood Culture adequate volume Performed at [facility], [address] [place], [place] [address]    Culture  Setup Time (A)  Final    GRAM VARIABLE ROD AEROBIC BOTTLE ONLY CRITICAL RESULT CALLED TO, READ BACK BY AND VERIFIED WITH: PHRMD J LEGGE [id number] AT 1059 AM BY CM    Culture (A)  Final    GRAM VARIABLE ROD SENT TO [facility] FOR IDENTIFICATION Sent to [facility] for further susceptibility testing. Performed at [facility], [place] [address], [place], [place] [address]    Report Status PENDING  Incomplete  Blood Culture ID Panel (Reflexed)     Status: None   Collection Time: 01/14/21  3:46 PM  Result Value Ref Range Status   Enterococcus faecalis NOT DETECTED NOT DETECTED Final   Enterococcus Faecium NOT DETECTED NOT DETECTED Final   Listeria monocytogenes NOT DETECTED NOT DETECTED Final   Staphylococcus species NOT DETECTED NOT DETECTED Final   Staphylococcus aureus (BCID) NOT DETECTED NOT DETECTED Final   Staphylococcus epidermidis NOT DETECTED NOT DETECTED Final   Staphylococcus lugdunensis NOT DETECTED NOT DETECTED Final   Streptococcus species NOT DETECTED NOT DETECTED Final   Streptococcus agalactiae NOT DETECTED NOT DETECTED Final   Streptococcus pneumoniae NOT DETECTED NOT DETECTED Final   Streptococcus pyogenes NOT DETECTED NOT DETECTED Final   A.calcoaceticus-baumannii NOT DETECTED NOT DETECTED Final   Bacteroides  fragilis NOT DETECTED NOT DETECTED Final   Enterobacterales NOT DETECTED NOT DETECTED Final   Enterobacter cloacae complex NOT DETECTED NOT DETECTED Final   Escherichia coli NOT DETECTED NOT DETECTED Final   Klebsiella aerogenes NOT DETECTED NOT DETECTED Final   Klebsiella oxytoca NOT DETECTED NOT DETECTED Final   Klebsiella pneumoniae NOT DETECTED NOT DETECTED Final   Proteus species NOT DETECTED NOT DETECTED Final   Salmonella species NOT DETECTED NOT DETECTED Final   Serratia marcescens NOT DETECTED NOT DETECTED Final   Haemophilus influenzae NOT DETECTED NOT DETECTED Final   Neisseria meningitidis NOT DETECTED NOT DETECTED Final   Pseudomonas aeruginosa NOT DETECTED NOT DETECTED Final   Stenotrophomonas maltophilia NOT DETECTED NOT DETECTED Final   Candida albicans NOT DETECTED NOT DETECTED Final   Candida auris NOT DETECTED NOT DETECTED Final   Candida glabrata NOT DETECTED NOT DETECTED Final   Candida krusei NOT DETECTED NOT DETECTED Final   Candida parapsilosis NOT DETECTED NOT DETECTED Final   Candida tropicalis NOT DETECTED NOT DETECTED Final   Cryptococcus neoformans/gattii NOT DETECTED NOT DETECTED Final    Comment: Performed at [facility], [place] [place], [place] [address]  Urine culture     Status: Abnormal   Collection Time: 01/14/21  3:47  PM   Specimen: Urine, Catheterized  Result Value Ref Range Status   Specimen Description   Final    IN/OUT CATH URINE Performed at [facility], [address] [place], [place] [address]    Special Requests   Final    NONE Performed at [facility], [address] [place], [place] [address]    Culture >=100,000 COLONIES/mL ENTEROCOCCUS FAECALIS (A)  Final   Report Status 01/17/2021 FINAL  Final   Organism ID, Bacteria ENTEROCOCCUS FAECALIS (A)  Final      Susceptibility   Enterococcus faecalis - MIC*    AMPICILLIN <=2 SENSITIVE Sensitive     NITROFURANTOIN <=16 SENSITIVE Sensitive      VANCOMYCIN 1 SENSITIVE Sensitive     * >=100,000 COLONIES/mL ENTEROCOCCUS FAECALIS  Resp Panel by RT-PCR (Flu A&B, Covid) Nasopharyngeal Swab     Status: None   Collection Time: 01/14/21  3:47 PM   Specimen: Nasopharyngeal Swab; Nasopharyngeal(NP) swabs in vial transport medium  Result Value Ref Range Status   SARS Coronavirus 2 by RT PCR NEGATIVE NEGATIVE Final    Comment: (NOTE) SARS-CoV-2 target nucleic acids are NOT DETECTED.  The SARS-CoV-2 RNA is generally detectable in upper respiratory specimens during the acute phase of infection. The lowest concentration of SARS-CoV-2 viral copies this assay can detect is 138 copies/mL. A negative result does not preclude SARS-Cov-2 infection and should not be used as the sole basis for treatment or other patient management decisions. A negative result may occur with  improper specimen collection/handling, submission of specimen other than nasopharyngeal swab, presence of viral mutation(s) within the areas targeted by this assay, and inadequate number of viral copies(<138 copies/mL). A negative result must be combined with clinical observations, patient history, and epidemiological information. The expected result is Negative.  Fact Sheet for Patients:  EntrepreneurPulse.com.au  Fact Sheet for Healthcare Providers:  IncredibleEmployment.be  This test is no t yet approved or cleared by the [place] FDA and  has been authorized for detection and/or diagnosis of SARS-CoV-2 by FDA under an Emergency Use Authorization (EUA). This EUA will remain  in effect (meaning this test can be used) for the duration of the COVID-19 declaration under Section 564(b)(1) of the Act, 21 U.S.C.section 360bbb-3(b)(1), unless the authorization is terminated  or revoked sooner.       Influenza A by PCR NEGATIVE NEGATIVE Final   Influenza B by PCR NEGATIVE NEGATIVE Final    Comment: (NOTE) The Xpert Xpress  SARS-CoV-2/FLU/RSV plus assay is intended as an aid in the diagnosis of influenza from Nasopharyngeal swab specimens and should not be used as a sole basis for treatment. Nasal washings and aspirates are unacceptable for Xpert Xpress SARS-CoV-2/FLU/RSV testing.  Fact Sheet for Patients: EntrepreneurPulse.com.au  Fact Sheet for Healthcare Providers: IncredibleEmployment.be  This test is not yet approved or cleared by the [place] FDA and has been authorized for detection and/or diagnosis of SARS-CoV-2 by FDA under an Emergency Use Authorization (EUA). This EUA will remain in effect (meaning this test can be used) for the duration of the COVID-19 declaration under Section 564(b)(1) of the Act, 21 U.S.C. section 360bbb-3(b)(1), unless the authorization is terminated or revoked.  Performed at [facility], [address] [place], [place] [address]   Blood Culture (routine x 2)     Status: Abnormal (Preliminary result)   Collection Time: 01/14/21  3:51 PM   Specimen: BLOOD LEFT ARM  Result Value Ref Range Status  Specimen Description   Final    BLOOD LEFT ARM Performed at [facility], [address] [place], [place] [address]    Special Requests   Final    BOTTLES DRAWN AEROBIC AND ANAEROBIC Blood Culture adequate volume Performed at [facility], [address] [place], [place] [address]    Culture  Setup Time (A)  Final    GRAM VARIABLE ROD AEROBIC BOTTLE ONLY CRITICAL VALUE NOTED.  VALUE IS CONSISTENT WITH PREVIOUSLY REPORTED AND CALLED VALUE.    Culture (A)  Final    GRAM VARIABLE ROD SENT TO [facility] FOR IDENTIFICATION Performed at [facility], [place] [address], [place], [place] [address]    Report Status PENDING  Incomplete  Respiratory (~20 pathogens) panel by PCR     Status: None   Collection Time: 01/14/21  7:22 PM   Specimen: Nasopharyngeal Swab; Respiratory  Result Value  Ref Range Status   Adenovirus NOT DETECTED NOT DETECTED Final   Coronavirus 229E NOT DETECTED NOT DETECTED Final    Comment: (NOTE) The Coronavirus on the Respiratory Panel, DOES NOT test for the novel  Coronavirus (2019 nCoV)    Coronavirus HKU1 NOT DETECTED NOT DETECTED Final   Coronavirus NL63 NOT DETECTED NOT DETECTED Final   Coronavirus OC43 NOT DETECTED NOT DETECTED Final   Metapneumovirus NOT DETECTED NOT DETECTED Final   Rhinovirus / Enterovirus NOT DETECTED NOT DETECTED Final   Influenza A NOT DETECTED NOT DETECTED Final   Influenza B NOT DETECTED NOT DETECTED Final   Parainfluenza Virus 1 NOT DETECTED NOT DETECTED Final   Parainfluenza Virus 2 NOT DETECTED NOT DETECTED Final   Parainfluenza Virus 3 NOT DETECTED NOT DETECTED Final   Parainfluenza Virus 4 NOT DETECTED NOT DETECTED Final   Respiratory Syncytial Virus NOT DETECTED NOT DETECTED Final   Bordetella pertussis NOT DETECTED NOT DETECTED Final   Bordetella Parapertussis NOT DETECTED NOT DETECTED Final   Chlamydophila pneumoniae NOT DETECTED NOT DETECTED Final   Mycoplasma pneumoniae NOT DETECTED NOT DETECTED Final    Comment: Performed at [facility], [place] [address], [place], [place] [address]  MRSA PCR Screening     Status: None   Collection Time: 01/14/21 11:15 PM   Specimen: Nasopharyngeal  Result Value Ref Range Status   MRSA by PCR NEGATIVE NEGATIVE Final    Comment:        The GeneXpert MRSA Assay (FDA approved for NASAL specimens only), is one component of a comprehensive MRSA colonization surveillance program. It is not intended to diagnose MRSA infection nor to guide or monitor treatment for MRSA infections. Performed at [facility], [place] [address], [place], [place] [address]   Culture, blood (routine x 2)     Status: None (Preliminary result)   Collection Time: 01/16/21  9:43 AM   Specimen: BLOOD  Result Value Ref Range Status   Specimen Description   Final    BLOOD  RIGHT ANTECUBITAL Performed at [facility] [address] [place], [place] [address]    Special Requests   Final    BOTTLES DRAWN AEROBIC AND ANAEROBIC Blood Culture adequate volume Performed at [facility] [address], [place],  [address]    Culture   Final    NO GROWTH 2 DAYS Performed at [facility] [address], [place],  [address]    Report Status PENDING  Incomplete  Culture, blood (routine x 2)  Status: None (Preliminary result)   Collection Time: 01/16/21  9:51 AM   Specimen: BLOOD  Result Value Ref Range Status   Specimen Description   Final    BLOOD LEFT ANTECUBITAL Performed at [facility] [address] [place], [place] [address]    Special Requests   Final    BOTTLES DRAWN AEROBIC ONLY Blood Culture adequate volume Performed at [facility] [address], [place], [place] [address]    Culture   Final    NO GROWTH 2 DAYS Performed at [facility] [address], [place], [place] [address]    Report Status PENDING  Incomplete         Radiology Studies: US RENAL  Result Date: 01/17/2021 CLINICAL DATA:  Urinary retention EXAM: RENAL / URINARY TRACT ULTRASOUND COMPLETE COMPARISON:  CT abdomen and pelvis Jan 14, 2021 FINDINGS: Right Kidney: Renal measurements: 9.1 x 3.9 x 4.2 cm = volume: 77.6 mL. Echogenicity and renal cortical thickness are within normal limits. No mass, perinephric fluid, or hydronephrosis visualized. No sonographically demonstrable calculus or ureterectasis. Left Kidney: Renal measurements: 9.6 x 4.7 x 5.8 cm = volume: 135.8 mL. Echogenicity and renal cortical thickness are within normal limits. No mass, perinephric fluid, or hydronephrosis visualized. No sonographically demonstrable calculus or ureterectasis. Bladder: Appears normal for degree of bladder distention. Note that there is a Foley catheter in the bladder. There is  still significant volume of urine in the bladder. Flow from each distal ureter seen within the bladder. Other: Mild ascites noted. IMPRESSION: Normal appearing kidneys bilaterally. There is moderate urine in the bladder with Foley catheter in place. No bladder wall thickening, focal urinary bladder lesion appreciable by ultrasound. Flow from each distal ureter seen within the bladder. Mild ascites noted. Electronically Signed   By: Lowella Grip III M.D.   On: 01/17/2021 20:20        Scheduled Meds: . amLODipine  10 mg Oral Daily  . Chlorhexidine Gluconate Cloth  6 each Topical Daily  . lamoTRIgine  200 mg Oral Daily  . levothyroxine  75 mcg Oral Q0600  . mouth rinse  15 mL Mouth Rinse BID  . mirtazapine  15 mg Oral QHS   Followed by  . [START ON 01/24/2021] mirtazapine  30 mg Oral QHS  . oxybutynin  2.5 mg Oral BID  . pantoprazole  40 mg Oral Daily  . sodium chloride flush  10-40 mL Intracatheter Q12H  . tamsulosin  0.4 mg Oral Daily  . vancomycin  125 mg Oral QID   Continuous Infusions: . sodium chloride 75 mL/hr at 01/18/21 1211  . ceFEPime (MAXIPIME) IV 2 g (01/18/21 2147)     LOS: 3 days    Time spent:25 mins. More than 50% of that time was spent in counseling and/or coordination of care.      Shelly Coss, MD [facility] P6/12/2020, 8:14 AM

## 2021-01-20 LAB — BASIC METABOLIC PANEL
Anion gap: 7 (ref 5–15)
BUN: 9 mg/dL (ref 8–23)
CO2: 20 mmol/L — ABNORMAL LOW (ref 22–32)
Calcium: 7.7 mg/dL — ABNORMAL LOW (ref 8.9–10.3)
Chloride: 112 mmol/L — ABNORMAL HIGH (ref 98–111)
Creatinine, Ser: 0.65 mg/dL (ref 0.44–1.00)
GFR, Estimated: >60 mL/min (ref >60)
Glucose, Bld: 100 mg/dL — ABNORMAL HIGH (ref 70–99)
Potassium: 3.1 mmol/L — ABNORMAL LOW (ref 3.5–5.1)
Sodium: 139 mmol/L (ref 135–145)

## 2021-01-20 LAB — CBC
HCT: 33.2 % — ABNORMAL LOW (ref 36.0–46.0)
Hemoglobin: 11 g/dL — ABNORMAL LOW (ref 12.0–15.0)
MCH: 29.6 pg (ref 26.0–34.0)
MCHC: 33.1 g/dL (ref 30.0–36.0)
MCV: 89.5 fL (ref 80.0–100.0)
Platelets: 169 10*3/uL (ref 150–400)
RBC: 3.71 MIL/uL — ABNORMAL LOW (ref 3.87–5.11)
RDW: 13.7 % (ref 11.5–15.5)
WBC: 5.2 10*3/uL (ref 4.0–10.5)
nRBC: 0 % (ref 0.0–0.2)

## 2021-01-20 MED ORDER — TORSEMIDE 20 MG PO TABS
20.0000 mg | ORAL_TABLET | Freq: Every day | ORAL | Status: DC
  Administered 2021-01-20: 20 mg via ORAL
  Filled 2021-01-20: qty 1

## 2021-01-20 MED ORDER — POTASSIUM CHLORIDE CRYS ER 20 MEQ PO TBCR
60.0000 meq | EXTENDED_RELEASE_TABLET | Freq: Once | ORAL | Status: AC
  Administered 2021-01-20: 60 meq via ORAL
  Filled 2021-01-20: qty 3

## 2021-01-20 NOTE — Plan of Care (Signed)
  Problem: Education: Goal: Knowledge of General Education information will improve Description: Including pain rating scale, medication(s)/side effects and non-pharmacologic comfort measures Outcome: Adequate for Discharge   Problem: Activity: Goal: Risk for activity intolerance will decrease Outcome: Adequate for Discharge   Problem: Elimination: Goal: Will not experience complications related to bowel motility Outcome: Adequate for Discharge   Problem: Skin Integrity: Goal: Risk for impaired skin integrity will decrease Outcome: Adequate for Discharge

## 2021-01-20 NOTE — Progress Notes (Signed)
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  COVID screen test was negative.  On presentation patient was febrile, tachycardic.  Lab work showed hypokalemia, elevated troponin.  Patient was confused on admission.  Patient was admitted for the management of severe sepsis.ID were following.  Hospital course remarkable for persistent diarrhea secondary to C. difficile.  Started on oral Vanco.  Overall condition is improved.  Now waiting for diarrhea to slow down before discharge.  She will continue Cefipime on discharge.  Assessment & Plan:   Active Problems:   OBSTRUCTIVE SLEEP APNEA   Chronic pain syndrome   Hypogammaglobulinemia (HCC)   Left bundle branch block   Restless legs syndrome   Chronic kidney disease   Hypothyroidism   SIRS (systemic inflammatory response syndrome) (HCC)   Elevated troponin   Hypokalemia   Headache   Acute metabolic encephalopathy   Severe sepsis: Presented with fever with evidence of end organ damage, confusion, troponin elevation.  Elevated procalcitonin .started on IV antibiotics, IV fluids.    Patient has history of immunoglobulin deficiency. Chest x-ray, CT renal did not show any acute findings.  No clear source of sepsis at present.  Venous Doppler negative for DVT.  She has a port on the left chest.  Blood culture in the aerobic bottle now showing gram variable rod,not exactly identified.  Currently on cefepime, ID recommending to continue cefepime 01/29/21.  Repeat blood cultures have been sent,NGTD  C. difficile diarrhea,PCR positive: She was seen by GI as an outpatient few days ago for diarrhea and her C.  difficile PCR was positive on 01/14/21.  Started on vancomycin here  since she has relapse of diarrhoea .  Does not complain of abdominal pain.  She still has frequent diarrhea.  Acute metabolic encephalopathy: Most likely secondary to sepsis versus polypharmacy.  Much improved, currently alert and oriented. she was on multiple psychoactive medications at home which includes baclofen, Suboxone, Vistaril, Lamictal, Remeron, Requip, Zanaflex.CT head did not show any acute intracranial  abnormality.  Headache/back pain: Has history of migraines, has history of chronic back pain.  Continue supportive care.  Minimize sedatives.    No signs of CNS infection.  On sumatriptan as needed.  Given some doses of migraine cocktail.  UTI: Denies any dysuria but urine culture showing significant Enterococcus faecalis, treated with Zosyn.  Elevated troponin: Most likely secondary to supply demand ischemia from type II MI.  Echocardiogram showed ejection fraction of 55 to 60%, indeterminate left ventricular diastolic parameters.  Cardiology consulted.  No plan for intervention. denies any chest pain.  Started on heparin by the admitting physician ,now discontinued   Has history of left bundle branch block.  EKG did not show signs of any ischemic changes  History of hypogammaglobinemia: History of common variable immune deficiency.  Followed by Dr. London Pepper with hematology at [facility] every 3 weeks as an outpatient.  GERD/history of gastric bypass: Continue Protonix  Leucopenia/Thrombocytopenia:   Continue to monitor  Hypothyroidism: Continue Synthyroid  Hypertension: Blood pressure stable on amlod 10 mg daily  Urine retention:Started on Flomax,foley inserted. Ultrasound of the kidneys showed urine in the bladder even with Foley.  Urology consulted, recommended to  continue tamsulosin and follow-up as an outpatient.  She is history of chronic urinary retention.Foley  removed  Generalized weakness/debility/deconditioning: Home health recommended after  PT/OT evaluation.           DVT prophylaxis:SCD Code Status: Full Family Communication:: Discussed with the husband on phone on 01/17/21   Dispo: The patient is from: Home              Anticipated d/c is to: Home              Patient currently is not medically stable to d/c.   Difficult to place patient No     Consultants: PCCM, cardiology  Procedures: None  Antimicrobials:  Anti-infectives (From admission, onward)   Start     Dose/Rate Route Frequency Ordered Stop   01/17/21 1445  ceFEPIme (MAXIPIME) 2 g in sodium chloride 0.9 % 100 mL IVPB        2 g 200 mL/hr over 30 Minutes Intravenous Every 12 hours 01/17/21 1356     01/17/21 1400  vancomycin (VANCOCIN) capsule 125 mg        125 mg Oral 4 times daily 01/17/21 1203 01/27/21 1359   01/15/21 1815  vancomycin (VANCOCIN) capsule 125 mg  Status:  Discontinued        125 mg Oral 4 times daily 01/15/21 1718 01/16/21 1145   01/15/21 1800  vancomycin (VANCOCIN) 50 mg/mL oral solution 125 mg  Status:  Discontinued        125 mg Oral Every 6 hours 01/15/21 1654 01/15/21 1717   01/15/21 1700  vancomycin (VANCOREADY) IVPB 750 mg/150 mL  Status:  Discontinued        750 mg 150 mL/hr over 60 Minutes Intravenous Every 24 hours 01/14/21 1931 01/15/21 1344   01/15/21 0600  piperacillin-tazobactam (ZOSYN) IVPB 3.375 g  Status:  Discontinued        3.375 g 12.5 mL/hr over 240 Minutes Intravenous Every 8 hours 01/14/21 1931 01/17/21 1356   01/15/21 0000  doxycycline (VIBRAMYCIN) 100 mg in sodium chloride 0.9 % 250 mL IVPB  Status:  Discontinued        100 mg 125 mL/hr over 120 Minutes Intravenous Every 12 hours 01/14/21 2310 01/15/21 1344   01/14/21 1930  piperacillin-tazobactam (ZOSYN) IVPB 3.375 g        3.375 g 100 mL/hr over 30 Minutes Intravenous  Once 01/14/21 1918 01/14/21 2005   01/14/21 1915  aztreonam (AZACTAM) 2 g in sodium chloride 0.9 %  100 mL IVPB  Status:  Discontinued        2 g 200 mL/hr over 30 Minutes Intravenous  Once 01/14/21 1901 01/14/21 1918   01/14/21 1915  metroNIDAZOLE (FLAGYL) IVPB 500 mg  Status:  Discontinued        500 mg 100 mL/hr over 60 Minutes Intravenous  Once 01/14/21 1901 01/14/21 1917   01/14/21 1915  vancomycin (VANCOCIN) IVPB 1000 mg/200 mL premix        1,000 mg 200 mL/hr over 60 Minutes Intravenous  Once 01/14/21 1901 01/14/21 2053      Subjective:  Patient seen and examined the bedside this morning. Hemodynamically stable.  Still having frequent diarrhea today.  Does not appear dehydrated.  Denies any abdominal pain.  No headache or back pain today.   Objective: Vitals:   01/18/21 1138 01/19/21 1322 01/19/21 2004 01/20/21 0507  BP: 124/60 (!) 121/58 138/67 (!) 141/66  Pulse: 97 96 88 90  Resp: (!) 21 18 18  20  Temp: 98.4 F (36.9 C) 98.6 F (37 C) 98.1 F (36.7 C) 98 F (36.7 C)  TempSrc: Oral Oral Oral Oral  SpO2: 95% 100% 100% 100%  Weight:      Height:        Intake/Output Summary (Last 24 hours) at 01/20/2021 1306 Last data filed at 01/20/2021 1000 Gross per 24 hour  Intake 121 ml  Output --  Net 121 ml   Filed Weights   01/14/21 1437  Weight: 52.2 kg    Examination:   General exam: Overall comfortable, not in distress, chronically ill looking HEENT: PERRL Respiratory system:  no wheezes or crackles  Cardiovascular system: S1 & S2 heard, RRR.  Chemo-Port on the left chest Gastrointestinal system: Abdomen is nondistended, soft and nontender. Central nervous system: Alert and oriented Extremities: No edema, no clubbing ,no cyanosis Skin: No rashes, no ulcers,no icterus   Data Reviewed: I have personally reviewed following labs and imaging studies  CBC: Recent Labs  Lab 01/14/21 1547 01/15/21 0500 01/16/21 0538 01/17/21 0628 01/18/21 0500 01/19/21 0339 01/20/21 0330  WBC 6.0 6.4 2.2* 3.1* 2.6* 4.4 5.2  NEUTROABS 5.6 6.0  --   --   --   --   --   HGB  13.6 12.9 10.9* 11.1* 11.5* 10.8* 11.0*  HCT 40.4 38.3 33.9* 32.7* 34.6* 32.5* 33.2*  MCV 88.6 91.0 92.1 89.3 89.2 89.8 89.5  PLT 120* 105* 73* 124* 128* 133* 381   Basic Metabolic Panel: Recent Labs  Lab 01/14/21 1547 01/14/21 2158 01/15/21 0030 01/15/21 0500 01/16/21 0538 01/17/21 0628 01/18/21 0500 01/20/21 0330  NA 135   < >  --  135 137 138 138 139  K 2.8*   < >  --  4.0 3.5 2.9* 3.7 3.1*  CL 101   < >  --  106 109 112* 112* 112*  CO2 23   < >  --  25 22 20* 21* 20*  GLUCOSE 136*   < >  --  121* 69* 85 98 100*  BUN 23   < >  --  15 16 16 8 9   CREATININE 0.81   < >  --  0.65 0.74 0.78 0.42* 0.65  CALCIUM 8.9   < >  --  8.1* 7.6* 7.6* 7.6* 7.7*  MG 1.4*  --   --  3.0*  --   --   --   --   PHOS  --   --  3.2 2.8  --   --   --   --    < > = values in this interval not displayed.   GFR: Estimated Creatinine Clearance: 52.4 mL/min (by C-G formula based on SCr of 0.65 mg/dL). Liver Function Tests: Recent Labs  Lab 01/14/21 1547 01/15/21 0500  AST 33 33  ALT 29 29  ALKPHOS 72 56  BILITOT 0.4 0.4  PROT 7.2 6.5  ALBUMIN 3.4* 2.9*   No results for input(s): LIPASE, AMYLASE in the last 168 hours. No results for input(s): AMMONIA in the last 168 hours. Coagulation Profile: Recent Labs  Lab 01/14/21 1547  INR 0.9   Cardiac Enzymes: Recent Labs  Lab 01/15/21 0030  CKTOTAL 78   BNP (last 3 results) No results for input(s): PROBNP in the last 8760 hours. HbA1C: No results for input(s): HGBA1C in the last 72 hours. CBG: Recent Labs  Lab 01/14/21 2201 01/15/21 1550  GLUCAP 142* 97   Lipid Profile: No results for input(s): CHOL, HDL, LDLCALC, TRIG,  CHOLHDL, LDLDIRECT in the last 72 hours. Thyroid Function Tests: No results for input(s): TSH, T4TOTAL, FREET4, T3FREE, THYROIDAB in the last 72 hours. Anemia Panel: No results for input(s): VITAMINB12, FOLATE, FERRITIN, TIBC, IRON, RETICCTPCT in the last 72 hours. Sepsis Labs: Recent Labs  Lab 01/14/21 1547  01/14/21 1755 01/14/21 1922 01/14/21 2158 01/16/21 0538  PROCALCITON  --   --  3.55 4.97 3.98  LATICACIDVEN 1.3 1.0  --   --   --     Recent Results (from the past 240 hour(s))  Clostridium Difficile by PCR     Status: Abnormal   Collection Time: 01/14/21 10:00 AM   Specimen: STOOL   Stool  Result Value Ref Range Status   Toxigenic C. Difficile by PCR Positive (A) Negative Final    Comment: Toxigenic C difficile: Positive Epidemic Strain Bl/NAP1/027: Presumptive Negative   Blood Culture (routine x 2)     Status: Abnormal (Preliminary result)   Collection Time: 01/14/21  3:46 PM   Specimen: BLOOD  Result Value Ref Range Status   Specimen Description   Final    BLOOD LEFT CHEST PORTA CATH Performed at [facility], [address] [place], [place] [address]    Special Requests   Final    BOTTLES DRAWN AEROBIC AND ANAEROBIC Blood Culture adequate volume Performed at [facility], [address] [place], [place] [address]    Culture  Setup Time (A)  Final    GRAM VARIABLE ROD AEROBIC BOTTLE ONLY CRITICAL RESULT CALLED TO, READ BACK BY AND VERIFIED WITH: PHRMD J LEGGE [id number] AT 1059 AM BY CM    Culture (A)  Final    GRAM VARIABLE ROD SENT TO [facility] FOR IDENTIFICATION Sent to [facility] for further susceptibility testing. Performed at [facility], [place] [address], [place], [place] [address]    Report Status PENDING  Incomplete  Blood Culture ID Panel (Reflexed)     Status: None   Collection Time: 01/14/21  3:46 PM  Result Value Ref Range Status   Enterococcus faecalis NOT DETECTED NOT DETECTED Final   Enterococcus Faecium NOT DETECTED NOT DETECTED Final   Listeria monocytogenes NOT DETECTED NOT DETECTED Final   Staphylococcus species NOT DETECTED NOT DETECTED Final   Staphylococcus aureus (BCID) NOT DETECTED NOT DETECTED Final   Staphylococcus epidermidis NOT DETECTED NOT DETECTED Final   Staphylococcus lugdunensis NOT DETECTED NOT DETECTED  Final   Streptococcus species NOT DETECTED NOT DETECTED Final   Streptococcus agalactiae NOT DETECTED NOT DETECTED Final   Streptococcus pneumoniae NOT DETECTED NOT DETECTED Final   Streptococcus pyogenes NOT DETECTED NOT DETECTED Final   A.calcoaceticus-baumannii NOT DETECTED NOT DETECTED Final   Bacteroides fragilis NOT DETECTED NOT DETECTED Final   Enterobacterales NOT DETECTED NOT DETECTED Final   Enterobacter cloacae complex NOT DETECTED NOT DETECTED Final   Escherichia coli NOT DETECTED NOT DETECTED Final   Klebsiella aerogenes NOT DETECTED NOT DETECTED Final   Klebsiella oxytoca NOT DETECTED NOT DETECTED Final   Klebsiella pneumoniae NOT DETECTED NOT DETECTED Final   Proteus species NOT DETECTED NOT DETECTED Final   Salmonella species NOT DETECTED NOT DETECTED Final   Serratia marcescens NOT DETECTED NOT DETECTED Final   Haemophilus influenzae NOT DETECTED NOT DETECTED Final   Neisseria meningitidis NOT DETECTED NOT DETECTED Final   Pseudomonas aeruginosa NOT DETECTED NOT DETECTED Final   Stenotrophomonas maltophilia NOT DETECTED NOT DETECTED Final   Candida albicans NOT DETECTED NOT DETECTED Final   Candida  auris NOT DETECTED NOT DETECTED Final   Candida glabrata NOT DETECTED NOT DETECTED Final   Candida krusei NOT DETECTED NOT DETECTED Final   Candida parapsilosis NOT DETECTED NOT DETECTED Final   Candida tropicalis NOT DETECTED NOT DETECTED Final   Cryptococcus neoformans/gattii NOT DETECTED NOT DETECTED Final    Comment: Performed at [facility], [place] [address] [place], [place] [address]  Urine culture     Status: Abnormal   Collection Time: 01/14/21  3:47 PM   Specimen: Urine, Catheterized  Result Value Ref Range Status   Specimen Description   Final    IN/OUT CATH URINE Performed at [facility], [address] [place], [place] [address]    Special Requests   Final    NONE Performed at [facility], [address] [place], [place]  [address]    Culture >=100,000 COLONIES/mL ENTEROCOCCUS FAECALIS (A)  Final   Report Status 01/17/2021 FINAL  Final   Organism ID, Bacteria ENTEROCOCCUS FAECALIS (A)  Final      Susceptibility   Enterococcus faecalis - MIC*    AMPICILLIN <=2 SENSITIVE Sensitive     NITROFURANTOIN <=16 SENSITIVE Sensitive     VANCOMYCIN 1 SENSITIVE Sensitive     * >=100,000 COLONIES/mL ENTEROCOCCUS FAECALIS  Resp Panel by RT-PCR (Flu A&B, Covid) Nasopharyngeal Swab     Status: None   Collection Time: 01/14/21  3:47 PM   Specimen: Nasopharyngeal Swab; Nasopharyngeal(NP) swabs in vial transport medium  Result Value Ref Range Status   SARS Coronavirus 2 by RT PCR NEGATIVE NEGATIVE Final    Comment: (NOTE) SARS-CoV-2 target nucleic acids are NOT DETECTED.  The SARS-CoV-2 RNA is generally detectable in upper respiratory specimens during the acute phase of infection. The lowest concentration of SARS-CoV-2 viral copies this assay can detect is 138 copies/mL. A negative result does not preclude SARS-Cov-2 infection and should not be used as the sole basis for treatment or other patient management decisions. A negative result may occur with  improper specimen collection/handling, submission of specimen other than nasopharyngeal swab, presence of viral mutation(s) within the areas targeted by this assay, and inadequate number of viral copies(<138 copies/mL). A negative result must be combined with clinical observations, patient history, and epidemiological information. The expected result is Negative.  Fact Sheet for Patients:  EntrepreneurPulse.com.au  Fact Sheet for Healthcare Providers:  IncredibleEmployment.be  This test is no t yet approved or cleared by the [place] FDA and  has been authorized for detection and/or diagnosis of SARS-CoV-2 by FDA under an Emergency Use Authorization (EUA). This EUA will remain  in effect (meaning this test can be used) for the  duration of the COVID-19 declaration under Section 564(b)(1) of the Act, 21 U.S.C.section 360bbb-3(b)(1), unless the authorization is terminated  or revoked sooner.       Influenza A by PCR NEGATIVE NEGATIVE Final   Influenza B by PCR NEGATIVE NEGATIVE Final    Comment: (NOTE) The Xpert Xpress SARS-CoV-2/FLU/RSV plus assay is intended as an aid in the diagnosis of influenza from Nasopharyngeal swab specimens and should not be used as a sole basis for treatment. Nasal washings and aspirates are unacceptable for Xpert Xpress SARS-CoV-2/FLU/RSV testing.  Fact Sheet for Patients: EntrepreneurPulse.com.au  Fact Sheet for Healthcare Providers: IncredibleEmployment.be  This test is not yet approved or cleared by the [place] FDA and has been authorized for detection and/or diagnosis of SARS-CoV-2 by FDA under an Emergency Use Authorization (EUA). This EUA  will remain in effect (meaning this test can be used) for the duration of the COVID-19 declaration under Section 564(b)(1) of the Act, 21 U.S.C. section 360bbb-3(b)(1), unless the authorization is terminated or revoked.  Performed at [facility], [address] [place], [place] [address]   Blood Culture (routine x 2)     Status: Abnormal (Preliminary result)   Collection Time: 01/14/21  3:51 PM   Specimen: BLOOD LEFT ARM  Result Value Ref Range Status   Specimen Description   Final    BLOOD LEFT ARM Performed at [facility], [address] [place], [place] [address]    Special Requests   Final    BOTTLES DRAWN AEROBIC AND ANAEROBIC Blood Culture adequate volume Performed at [facility], [address] [place], [place] [address]    Culture  Setup Time (A)  Final    GRAM VARIABLE ROD AEROBIC BOTTLE ONLY CRITICAL VALUE NOTED.  VALUE IS CONSISTENT WITH PREVIOUSLY REPORTED AND CALLED VALUE.    Culture (A)  Final    GRAM VARIABLE ROD SENT TO [facility] FOR IDENTIFICATION Performed at [facility], [place] [address] [place], [place] [address]    Report Status PENDING  Incomplete  Respiratory (~20 pathogens) panel by PCR     Status: None   Collection Time: 01/14/21  7:22 PM   Specimen: Nasopharyngeal Swab; Respiratory  Result Value Ref Range Status   Adenovirus NOT DETECTED NOT DETECTED Final   Coronavirus 229E NOT DETECTED NOT DETECTED Final    Comment: (NOTE) The Coronavirus on the Respiratory Panel, DOES NOT test for the novel  Coronavirus (2019 nCoV)    Coronavirus HKU1 NOT DETECTED NOT DETECTED Final   Coronavirus NL63 NOT DETECTED NOT DETECTED Final   Coronavirus OC43 NOT DETECTED NOT DETECTED Final   Metapneumovirus NOT DETECTED NOT DETECTED Final   Rhinovirus / Enterovirus NOT DETECTED NOT DETECTED Final   Influenza A NOT DETECTED NOT DETECTED Final   Influenza B NOT DETECTED NOT DETECTED Final   Parainfluenza Virus 1 NOT DETECTED NOT DETECTED Final   Parainfluenza Virus 2 NOT DETECTED NOT DETECTED Final   Parainfluenza Virus 3 NOT DETECTED NOT DETECTED Final   Parainfluenza Virus 4 NOT DETECTED NOT DETECTED Final   Respiratory Syncytial Virus NOT DETECTED NOT DETECTED Final   Bordetella pertussis NOT DETECTED NOT DETECTED Final   Bordetella Parapertussis NOT DETECTED NOT DETECTED Final   Chlamydophila pneumoniae NOT DETECTED NOT DETECTED Final   Mycoplasma pneumoniae NOT DETECTED NOT DETECTED Final    Comment: Performed at [facility], [place] [address], [place], [place] [address]  MRSA PCR Screening     Status: None   Collection Time: 01/14/21 11:15 PM   Specimen: Nasopharyngeal  Result Value Ref Range Status   MRSA by PCR NEGATIVE NEGATIVE Final    Comment:        The GeneXpert MRSA Assay (FDA approved for NASAL specimens only), is one component of a comprehensive MRSA colonization surveillance program. It is not intended to diagnose MRSA infection nor to guide or monitor treatment for MRSA  infections. Performed at [facility], [place] [address], [place],  [address]   Culture, blood (routine x 2)     Status: None (Preliminary result)   Collection Time: 01/16/21  9:43 AM   Specimen: BLOOD  Result Value Ref Range Status   Specimen Description   Final    BLOOD RIGHT ANTECUBITAL Performed  at [facility], [place] [address], [place], [place] [address]    Special Requests   Final    BOTTLES DRAWN AEROBIC AND ANAEROBIC Blood Culture adequate volume Performed at [place] [address] [place], [place] [address]    Culture   Final    NO GROWTH 4 DAYS Performed at [facility], [place] [address], [place], [place] [address]    Report Status PENDING  Incomplete  Culture, blood (routine x 2)     Status: None (Preliminary result)   Collection Time: 01/16/21  9:51 AM   Specimen: BLOOD  Result Value Ref Range Status   Specimen Description   Final    BLOOD LEFT ANTECUBITAL Performed at [place] [address], [place], [place] [address]    Special Requests   Final    BOTTLES DRAWN AEROBIC ONLY Blood Culture adequate volume Performed at [place] [address] [place], [place] [address]    Culture   Final    NO GROWTH 4 DAYS Performed at [facility], [place] [address] [place], [place] [address]    Report Status PENDING  Incomplete         Radiology Studies: No results found.      Scheduled Meds: . amLODipine  10 mg Oral Daily  . Chlorhexidine Gluconate Cloth  6 each Topical Daily  . lamoTRIgine  200 mg Oral Daily  . levothyroxine  75 mcg Oral Q0600  . mouth rinse  15 mL Mouth Rinse BID  . mirtazapine  15 mg Oral QHS   Followed by  . [START ON 01/24/2021] mirtazapine  30 mg Oral QHS  . oxybutynin  2.5 mg Oral BID  . pantoprazole  40 mg Oral Daily  . sodium chloride flush  10-40 mL Intracatheter Q12H  . tamsulosin  0.4 mg Oral Daily  .  vancomycin  125 mg Oral QID   Continuous Infusions: . ceFEPime (MAXIPIME) IV 2 g (01/20/21 0858)     LOS: 4 days    Time spent:25 mins. More than 50% of that time was spent in counseling and/or coordination of care.      Shelly Coss, MD [facility] P6/01/2021, 1:06 PM

## 2021-01-20 NOTE — TOC Progression Note (Signed)
Transition of Care [facility]) - Progression Note    Patient Details  Name: Megan Brewer MRN: [id number] Date of Birth: Jun 03, 1946  Transition [facility] CM/SW Contact  Purcell Mouton, RN Phone Number: 01/20/2021, 9:16 AM  Clinical Narrative:    Pt selected [facility] for HHPT. Referral given to in house rep with [facility].    Expected Discharge Plan: Home/Self Care Barriers to Discharge: Continued Medical Work up  Expected Discharge Plan and Services Expected Discharge Plan: Home/Self Care   Discharge Planning Services: CM Consult   Living arrangements for the past 2 months: Single Family Home                                       Social Determinants of Health (SDOH) Interventions    Readmission Risk Interventions No flowsheet data found.

## 2021-01-20 NOTE — Care Management Important Message (Signed)
Important Message  Patient Details IM Letter given to the Patient. Name: Megan Brewer MRN: [id number] Date of Birth: [birth year]   Medicare Important Message Given:  Yes     [name] 01/20/2021, 12:34 PM

## 2021-01-20 NOTE — Progress Notes (Signed)
PHARMACY CONSULT NOTE FOR:  OUTPATIENT  PARENTERAL ANTIBIOTIC THERAPY (OPAT)  Indication: Bacteremia Regimen: cefepime 2g IV q12h End date: 01/29/2021  IV antibiotic discharge orders are pended. To discharging provider:  please sign these orders via discharge navigator,  Select New Orders & click on the button choice - Manage This Unsigned Work.     Thank you for allowing pharmacy to be a part of this patient's care.  Phillis Haggis 01/20/2021, 9:07 AM

## 2021-01-21 LAB — CULTURE, BLOOD (ROUTINE X 2)
Culture: NO GROWTH
Culture: NO GROWTH
Special Requests: ADEQUATE
Special Requests: ADEQUATE

## 2021-01-21 LAB — BASIC METABOLIC PANEL
Anion gap: 7 (ref 5–15)
BUN: 14 mg/dL (ref 8–23)
CO2: 22 mmol/L (ref 22–32)
Calcium: 7.9 mg/dL — ABNORMAL LOW (ref 8.9–10.3)
Chloride: 109 mmol/L (ref 98–111)
Creatinine, Ser: 0.8 mg/dL (ref 0.44–1.00)
GFR, Estimated: >60 mL/min (ref >60)
Glucose, Bld: 129 mg/dL — ABNORMAL HIGH (ref 70–99)
Potassium: 3 mmol/L — ABNORMAL LOW (ref 3.5–5.1)
Sodium: 138 mmol/L (ref 135–145)

## 2021-01-21 LAB — MAGNESIUM: Magnesium: 1.9 mg/dL (ref 1.7–2.4)

## 2021-01-21 MED ORDER — ENOXAPARIN SODIUM 40 MG/0.4ML IJ SOSY
40.0000 mg | PREFILLED_SYRINGE | INTRAMUSCULAR | Status: DC
  Administered 2021-01-21: 40 mg via SUBCUTANEOUS
  Filled 2021-01-21: qty 0.4

## 2021-01-21 MED ORDER — POTASSIUM CHLORIDE CRYS ER 20 MEQ PO TBCR
40.0000 meq | EXTENDED_RELEASE_TABLET | Freq: Once | ORAL | Status: AC
  Administered 2021-01-21: 40 meq via ORAL
  Filled 2021-01-21: qty 2

## 2021-01-21 MED ORDER — TRIAMCINOLONE ACETONIDE 0.5 % EX CREA
TOPICAL_CREAM | Freq: Once | CUTANEOUS | Status: AC
  Administered 2021-01-21: 1 via TOPICAL
  Filled 2021-01-21: qty 15

## 2021-01-21 MED ORDER — POTASSIUM CHLORIDE 10 MEQ/100ML IV SOLN
10.0000 meq | INTRAVENOUS | Status: AC
  Administered 2021-01-21 (×3): 10 meq via INTRAVENOUS
  Filled 2021-01-21 (×3): qty 100

## 2021-01-21 NOTE — Progress Notes (Signed)
Occupational Therapy Treatment Patient Details Name: Megan Brewer MRN: [id number] DOB: Jun 17, 1946 Today's Date: 01/21/2021    History of present illness Patient is a 75 year old female generalized fatigue chills tachycardia febrile. PMH includes lung cancer sp ressection, immunoglobulin deficiency (PTA Gammagard infusions), recurrent UTI  Sp Gastric bipass 8-10 y ago   OT comments  Patient agreeable to sink side ADLs. Min G for safety to ambulate to sink without AD, supervision in standing with intermittent UE support on sink to wash face, hands, hair and brush her teeth tolerating ~10 mins standing activity. Patient reports fatigue after this and min G to transfer to recliner chair. Patient reports having a good day yesterday but feeling poorly today, continue to follow acutely in order to facilitate D/C home.   Follow Up Recommendations  No OT follow up    Equipment Recommendations  None recommended by OT       Precautions / Restrictions Precautions Precautions: Fall Precaution Comments: Droplet/Enteric       Mobility Bed Mobility Overal bed mobility: Modified Independent Bed Mobility: Supine to Sit                Transfers Overall transfer level: Needs assistance Equipment used: None Transfers: Sit to/from Stand Sit to Stand: Min guard              Balance Overall balance assessment: Needs assistance Sitting-balance support: Feet supported Sitting balance-Leahy Scale: Good     Standing balance support: Single extremity supported;No upper extremity supported Standing balance-Leahy Scale: Fair Standing balance comment: intermittent fair standing balance at sink side, does prefer to lean onto sink                           ADL either performed or assessed with clinical judgement   ADL Overall ADL's : Needs assistance/impaired     Grooming: Oral care;Wash/dry face;Wash/dry hands;Supervision/safety;Standing (washing hair) Grooming Details  (indicate cue type and reason): patient tolerates standing at sink for approximately 10 minutes to perform ADLs, leans onto sink for support and reports fatigue after task needing to sit down                 Toilet Transfer: Min guard;Ambulation Toilet Transfer Details (indicate cue type and reason): did not require use of rolling walker however furniture cruising to ambulate to chair, min G for safety         Functional mobility during ADLs: Min guard General ADL Comments: patient reports ambulating twice in hallway yesterday but today feels poorly again, still motivated to participate in ADL tasks               Cognition Arousal/Alertness: Awake/alert Behavior During Therapy: WFL for tasks assessed/performed Overall Cognitive Status: Within Functional Limits for tasks assessed                                                     Pertinent Vitals/ Pain       Pain Assessment: Faces Faces Pain Scale: Hurts little more Pain Location: stomach Pain Descriptors / Indicators: Sore Pain Intervention(s): Monitored during session         Frequency  Min 2X/week        Progress Toward Goals  OT Goals(current goals can now be found in the care plan section)  Progress towards OT goals: Progressing toward goals  Acute Rehab OT Goals Patient Stated Goal: home to husband OT Goal Formulation: With patient Time For Goal Achievement: 01/29/21 Potential to Achieve Goals: Good ADL Goals Pt Will Perform Grooming: with modified independence;standing Pt Will Perform Lower Body Dressing: with modified independence;sit to/from stand;sitting/lateral leans Pt Will Transfer to Toilet: with modified independence;ambulating;regular height toilet Pt Will Perform Toileting - Clothing Manipulation and hygiene: with modified independence;sitting/lateral leans;sit to/from stand  Plan Discharge plan remains appropriate       AM-PAC OT "6 Clicks" Daily Activity      Outcome Measure   Help from another person eating meals?: None Help from another person taking care of personal grooming?: A Little Help from another person toileting, which includes using toliet, bedpan, or urinal?: A Little Help from another person bathing (including washing, rinsing, drying)?: A Little Help from another person to put on and taking off regular upper body clothing?: A Little Help from another person to put on and taking off regular lower body clothing?: A Little 6 Click Score: 19    End of Session  OT Visit Diagnosis: Unsteadiness on feet (R26.81);Other abnormalities of gait and mobility (R26.89)   Activity Tolerance Patient tolerated treatment well   Patient Left in chair;with call bell/phone within reach;with chair alarm set   Nurse Communication Mobility status        Time: 1001-1025 OT Time Calculation (min): 24 min  Charges: OT General Charges $OT Visit: 1 Visit OT Treatments $Self Care/Home Management : 23-37 mins   Delbert Phenix OT OT pager: [place] 01/21/2021, 11:12 AM

## 2021-01-21 NOTE — Plan of Care (Signed)

## 2021-01-21 NOTE — Progress Notes (Signed)
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  COVID screen test was negative.  On presentation patient was febrile, tachycardic.  Lab work showed hypokalemia, elevated troponin.  Patient was confused on admission.  Patient was admitted for the management of severe sepsis.ID were following for positive blood culture.  Hospital course remarkable for persistent diarrhea secondary to C. difficile.  Started on oral Vanco.    We have reconsulted ID for management of persistent C. difficile diarrhea.  Assessment & Plan:   Active Problems:   OBSTRUCTIVE SLEEP APNEA   Chronic pain syndrome   Hypogammaglobulinemia (HCC)   Left bundle branch block   Restless legs syndrome   Chronic kidney disease   Hypothyroidism   SIRS (systemic inflammatory response syndrome) (HCC)   Elevated troponin   Hypokalemia   Headache   Acute metabolic encephalopathy   Severe sepsis: Presented with fever with evidence of end organ damage, confusion, troponin elevation.  Elevated procalcitonin .started on IV antibiotics, IV fluids.    Patient has history of immunoglobulin deficiency. Chest x-ray, CT renal did not show any acute findings.  No clear source of sepsis at present.  Venous Doppler negative for DVT.  She has a port on the left chest.  Blood culture in the aerobic bottle showed  gram variable rod,not exactly identified.  Currently on cefepime, ID recommending to continue cefepime 01/29/21.  Repeat blood cultures have been sent,NGTD  C. difficile diarrhea,PCR positive: She was seen by GI as an outpatient few days ago for diarrhea and her C. difficile PCR was positive on  01/14/21.  Started on vancomycin here  since she has relapse of diarrhoea .  Does not complain of abdominal pain.  She still has frequent diarrhea.  ID reconsulted.  She might need prolonged oral vancomycin course because she is on antibiotic  Acute metabolic encephalopathy: Most likely secondary to sepsis versus polypharmacy.  Much improved, currently alert and oriented. she was on multiple psychoactive medications at home which includes baclofen, Suboxone, Vistaril, Lamictal, Remeron, Requip, Zanaflex.CT head did not show any acute intracranial  abnormality.  Headache/back pain: Has history of migraines, has history of chronic back pain.  Continue supportive care.  Minimize sedatives.    No signs of CNS infection.  On sumatriptan as needed.  Given some doses of migraine cocktail.  UTI: Denies any dysuria but urine culture showing significant Enterococcus faecalis, was on  Zosyn,completed course.  Elevated troponin: Most likely secondary to supply demand ischemia from type II MI.  Echocardiogram showed ejection fraction of 55 to 60%, indeterminate left ventricular diastolic parameters.  Cardiologywere consulted.  No plan for intervention.Has history of left bundle branch block.  EKG did not show signs of any ischemic changes  History of hypogammaglobinemia: History of common variable immune deficiency.  Followed by Dr. London Pepper with hematology at [place] every 3 weeks as an outpatient.  Hypothyroidism: Continue Synthyroid  Hypertension: Blood pressure stable on amlod 10 mg daily  Urine retention:Started on Flomax,foley inserted. Ultrasound of the kidneys showed urine in the bladder even with Foley.  Urology consulted, recommended to continue tamsulosin and follow-up as an outpatient.  She is history of chronic urinary retention.Foley now removed  Generalized weakness/debility/deconditioning: Home health recommended after  PT/OT evaluation.           DVT  prophylaxis:SCD Code Status: Full Family Communication:: Discussed with the husband on phone on 01/20/21   Dispo: The patient is from: Home              Anticipated d/c is to: Home              Patient currently is not medically stable to d/c.   Difficult to place patient No Plan for discharge after slowing down of diarrhea.    Consultants: PCCM, cardiology  Procedures: None  Antimicrobials:  Anti-infectives (From admission, onward)   Start     Dose/Rate Route Frequency Ordered Stop   01/17/21 1445  ceFEPIme (MAXIPIME) 2 g in sodium chloride 0.9 % 100 mL IVPB        2 g 200 mL/hr over 30 Minutes Intravenous Every 12 hours 01/17/21 1356     01/17/21 1400  vancomycin (VANCOCIN) capsule 125 mg        125 mg Oral 4 times daily 01/17/21 1203 01/27/21 1359   01/15/21 1815  vancomycin (VANCOCIN) capsule 125 mg  Status:  Discontinued        125 mg Oral 4 times daily 01/15/21 1718 01/16/21 1145   01/15/21 1800  vancomycin (VANCOCIN) 50 mg/mL oral solution 125 mg  Status:  Discontinued        125 mg Oral Every 6 hours 01/15/21 1654 01/15/21 1717   01/15/21 1700  vancomycin (VANCOREADY) IVPB 750 mg/150 mL  Status:  Discontinued        750 mg 150 mL/hr over 60 Minutes Intravenous Every 24 hours 01/14/21 1931 01/15/21 1344   01/15/21 0600  piperacillin-tazobactam (ZOSYN) IVPB 3.375 g  Status:  Discontinued        3.375 g 12.5 mL/hr over 240 Minutes Intravenous Every 8 hours 01/14/21 1931 01/17/21 1356   01/15/21 0000  doxycycline (VIBRAMYCIN) 100 mg in sodium chloride 0.9 % 250 mL IVPB  Status:  Discontinued        100 mg 125 mL/hr over 120 Minutes Intravenous Every 12 hours 01/14/21 2310 01/15/21 1344   01/14/21 1930  piperacillin-tazobactam (ZOSYN) IVPB 3.375 g        3.375 g 100 mL/hr over 30 Minutes Intravenous  Once 01/14/21 1918 01/14/21 2005   01/14/21 1915  aztreonam (AZACTAM) 2 g in sodium chloride 0.9 % 100 mL IVPB  Status:  Discontinued        2 g 200 mL/hr over 30 Minutes  Intravenous  Once 01/14/21 1901 01/14/21 1918   01/14/21 1915  metroNIDAZOLE (FLAGYL) IVPB 500 mg  Status:  Discontinued        500 mg 100 mL/hr over 60 Minutes Intravenous  Once 01/14/21 1901 01/14/21 1917   01/14/21 1915  vancomycin (VANCOCIN) IVPB 1000 mg/200 mL premix        1,000 mg 200 mL/hr over 60 Minutes Intravenous  Once 01/14/21 1901 01/14/21 2053      Subjective:  Patient seen and examined the bedside this morning.  Sitting on the chair, weak but hemodynamically stable.  Still having frequent diarrhea.  Denies any abdomen pain, nausea or vomiting.  Objective: Vitals:   01/20/21 0507 01/20/21 1445 01/20/21 1940 01/21/21 0521  BP: (!) 141/66 125/61 133/63 (!) 143/66  Pulse: 90 88 86 81  Resp: 20 18 18 15   Temp: 98 F (36.7 C) 97.6 F (36.4 C) 97.9 F (36.6 C) 98.2 F (  36.8 C)  TempSrc: Oral Oral Oral Oral  SpO2: 100% 100% 100% 100%  Weight:      Height:        Intake/Output Summary (Last 24 hours) at 01/21/2021 1154 Last data filed at 01/21/2021 0800 Gross per 24 hour  Intake 1099.71 ml  Output --  Net 1099.71 ml   Filed Weights   01/14/21 1437  Weight: 52.2 kg    Examination:   General exam: Overall comfortable, chronically ill looking, weak HEENT: PERRL Respiratory system:  no wheezes or crackles  Cardiovascular system: S1 & S2 heard, RRR.  Chemo-Port on the left chest Gastrointestinal system: Abdomen is nondistended, soft and nontender. Central nervous system: Alert and oriented Extremities: No edema, no clubbing ,no cyanosis Skin: No rashes, no ulcers,no icterus   Data Reviewed: I have personally reviewed following labs and imaging studies  CBC: Recent Labs  Lab 01/14/21 1547 01/15/21 0500 01/16/21 0538 01/17/21 0628 01/18/21 0500 01/19/21 0339 01/20/21 0330  WBC 6.0 6.4 2.2* 3.1* 2.6* 4.4 5.2  NEUTROABS 5.6 6.0  --   --   --   --   --   HGB 13.6 12.9 10.9* 11.1* 11.5* 10.8* 11.0*  HCT 40.4 38.3 33.9* 32.7* 34.6* 32.5* 33.2*  MCV 88.6  91.0 92.1 89.3 89.2 89.8 89.5  PLT 120* 105* 73* 124* 128* 133* 981   Basic Metabolic Panel: Recent Labs  Lab 01/14/21 1547 01/14/21 2158 01/15/21 0030 01/15/21 0500 01/16/21 0538 01/17/21 0628 01/18/21 0500 01/20/21 0330 01/21/21 0339  NA 135   < >  --  135 137 138 138 139 138  K 2.8*   < >  --  4.0 3.5 2.9* 3.7 3.1* 3.0*  CL 101   < >  --  106 109 112* 112* 112* 109  CO2 23   < >  --  25 22 20* 21* 20* 22  GLUCOSE 136*   < >  --  121* 69* 85 98 100* 129*  BUN 23   < >  --  15 16 16 8 9 14   CREATININE 0.81   < >  --  0.65 0.74 0.78 0.42* 0.65 0.80  CALCIUM 8.9   < >  --  8.1* 7.6* 7.6* 7.6* 7.7* 7.9*  MG 1.4*  --   --  3.0*  --   --   --   --  1.9  PHOS  --   --  3.2 2.8  --   --   --   --   --    < > = values in this interval not displayed.   GFR: Estimated Creatinine Clearance: 52.4 mL/min (by C-G formula based on SCr of 0.8 mg/dL). Liver Function Tests: Recent Labs  Lab 01/14/21 1547 01/15/21 0500  AST 33 33  ALT 29 29  ALKPHOS 72 56  BILITOT 0.4 0.4  PROT 7.2 6.5  ALBUMIN 3.4* 2.9*   No results for input(s): LIPASE, AMYLASE in the last 168 hours. No results for input(s): AMMONIA in the last 168 hours. Coagulation Profile: Recent Labs  Lab 01/14/21 1547  INR 0.9   Cardiac Enzymes: Recent Labs  Lab 01/15/21 0030  CKTOTAL 78   BNP (last 3 results) No results for input(s): PROBNP in the last 8760 hours. HbA1C: No results for input(s): HGBA1C in the last 72 hours. CBG: Recent Labs  Lab 01/14/21 2201 01/15/21 1550  GLUCAP 142* 97   Lipid Profile: No results for input(s): CHOL, HDL, LDLCALC, TRIG, CHOLHDL, LDLDIRECT in  the last 72 hours. Thyroid Function Tests: No results for input(s): TSH, T4TOTAL, FREET4, T3FREE, THYROIDAB in the last 72 hours. Anemia Panel: No results for input(s): VITAMINB12, FOLATE, FERRITIN, TIBC, IRON, RETICCTPCT in the last 72 hours. Sepsis Labs: Recent Labs  Lab 01/14/21 1547 01/14/21 1755 01/14/21 1922  01/14/21 2158 01/16/21 0538  PROCALCITON  --   --  3.55 4.97 3.98  LATICACIDVEN 1.3 1.0  --   --   --     Recent Results (from the past 240 hour(s))  Clostridium Difficile by PCR     Status: Abnormal   Collection Time: 01/14/21 10:00 AM   Specimen: STOOL   Stool  Result Value Ref Range Status   Toxigenic C. Difficile by PCR Positive (A) Negative Final    Comment: Toxigenic C difficile: Positive Epidemic Strain Bl/NAP1/027: Presumptive Negative   Blood Culture (routine x 2)     Status: Abnormal (Preliminary result)   Collection Time: 01/14/21  3:46 PM   Specimen: BLOOD  Result Value Ref Range Status   Specimen Description   Final    BLOOD LEFT CHEST PORTA CATH Performed at [facility], [address] [place], [place] [address]    Special Requests   Final    BOTTLES DRAWN AEROBIC AND ANAEROBIC Blood Culture adequate volume Performed at [facility], [address] [place], [place] [address]    Culture  Setup Time (A)  Final    GRAM VARIABLE ROD AEROBIC BOTTLE ONLY CRITICAL RESULT CALLED TO, READ BACK BY AND VERIFIED WITH: PHRMD J LEGGE [id number] AT 1059 AM BY CM    Culture (A)  Final    GRAM VARIABLE ROD SENT TO [facility] FOR IDENTIFICATION Sent to [facility] for further susceptibility testing. Performed at [facility], [place] [address] [place], [place] [address]    Report Status PENDING  Incomplete  Blood Culture ID Panel (Reflexed)     Status: None   Collection Time: 01/14/21  3:46 PM  Result Value Ref Range Status   Enterococcus faecalis NOT DETECTED NOT DETECTED Final   Enterococcus Faecium NOT DETECTED NOT DETECTED Final   Listeria monocytogenes NOT DETECTED NOT DETECTED Final   Staphylococcus species NOT DETECTED NOT DETECTED Final   Staphylococcus aureus (BCID) NOT DETECTED NOT DETECTED Final   Staphylococcus epidermidis NOT DETECTED NOT DETECTED Final   Staphylococcus lugdunensis NOT DETECTED NOT DETECTED Final   Streptococcus species  NOT DETECTED NOT DETECTED Final   Streptococcus agalactiae NOT DETECTED NOT DETECTED Final   Streptococcus pneumoniae NOT DETECTED NOT DETECTED Final   Streptococcus pyogenes NOT DETECTED NOT DETECTED Final   A.calcoaceticus-baumannii NOT DETECTED NOT DETECTED Final   Bacteroides fragilis NOT DETECTED NOT DETECTED Final   Enterobacterales NOT DETECTED NOT DETECTED Final   Enterobacter cloacae complex NOT DETECTED NOT DETECTED Final   Escherichia coli NOT DETECTED NOT DETECTED Final   Klebsiella aerogenes NOT DETECTED NOT DETECTED Final   Klebsiella oxytoca NOT DETECTED NOT DETECTED Final   Klebsiella pneumoniae NOT DETECTED NOT DETECTED Final   Proteus species NOT DETECTED NOT DETECTED Final   Salmonella species NOT DETECTED NOT DETECTED Final   Serratia marcescens NOT DETECTED NOT DETECTED Final   Haemophilus influenzae NOT DETECTED NOT DETECTED Final   Neisseria meningitidis NOT DETECTED NOT DETECTED Final   Pseudomonas aeruginosa NOT DETECTED NOT DETECTED Final   Stenotrophomonas maltophilia NOT DETECTED NOT DETECTED Final   Candida albicans NOT DETECTED NOT DETECTED Final   Candida auris NOT DETECTED  NOT DETECTED Final   Candida glabrata NOT DETECTED NOT DETECTED Final   Candida krusei NOT DETECTED NOT DETECTED Final   Candida parapsilosis NOT DETECTED NOT DETECTED Final   Candida tropicalis NOT DETECTED NOT DETECTED Final   Cryptococcus neoformans/gattii NOT DETECTED NOT DETECTED Final    Comment: Performed at [facility], [place] [address] [place], [place] [address]  Urine culture     Status: Abnormal   Collection Time: 01/14/21  3:47 PM   Specimen: Urine, Catheterized  Result Value Ref Range Status   Specimen Description   Final    IN/OUT CATH URINE Performed at [facility], [address] [place], [place] [address]    Special Requests   Final    NONE Performed at [facility], [address] [place], [place] [address]    Culture >=100,000  COLONIES/mL ENTEROCOCCUS FAECALIS (A)  Final   Report Status 01/17/2021 FINAL  Final   Organism ID, Bacteria ENTEROCOCCUS FAECALIS (A)  Final      Susceptibility   Enterococcus faecalis - MIC*    AMPICILLIN <=2 SENSITIVE Sensitive     NITROFURANTOIN <=16 SENSITIVE Sensitive     VANCOMYCIN 1 SENSITIVE Sensitive     * >=100,000 COLONIES/mL ENTEROCOCCUS FAECALIS  Resp Panel by RT-PCR (Flu A&B, Covid) Nasopharyngeal Swab     Status: None   Collection Time: 01/14/21  3:47 PM   Specimen: Nasopharyngeal Swab; Nasopharyngeal(NP) swabs in vial transport medium  Result Value Ref Range Status   SARS Coronavirus 2 by RT PCR NEGATIVE NEGATIVE Final    Comment: (NOTE) SARS-CoV-2 target nucleic acids are NOT DETECTED.  The SARS-CoV-2 RNA is generally detectable in upper respiratory specimens during the acute phase of infection. The lowest concentration of SARS-CoV-2 viral copies this assay can detect is 138 copies/mL. A negative result does not preclude SARS-Cov-2 infection and should not be used as the sole basis for treatment or other patient management decisions. A negative result may occur with  improper specimen collection/handling, submission of specimen other than nasopharyngeal swab, presence of viral mutation(s) within the areas targeted by this assay, and inadequate number of viral copies(<138 copies/mL). A negative result must be combined with clinical observations, patient history, and epidemiological information. The expected result is Negative.  Fact Sheet for Patients:  EntrepreneurPulse.com.au  Fact Sheet for Healthcare Providers:  IncredibleEmployment.be  This test is no t yet approved or cleared by the [place] FDA and  has been authorized for detection and/or diagnosis of SARS-CoV-2 by FDA under an Emergency Use Authorization (EUA). This EUA will remain  in effect (meaning this test can be used) for the duration of the COVID-19  declaration under Section 564(b)(1) of the Act, 21 U.S.C.section 360bbb-3(b)(1), unless the authorization is terminated  or revoked sooner.       Influenza A by PCR NEGATIVE NEGATIVE Final   Influenza B by PCR NEGATIVE NEGATIVE Final    Comment: (NOTE) The Xpert Xpress SARS-CoV-2/FLU/RSV plus assay is intended as an aid in the diagnosis of influenza from Nasopharyngeal swab specimens and should not be used as a sole basis for treatment. Nasal washings and aspirates are unacceptable for Xpert Xpress SARS-CoV-2/FLU/RSV testing.  Fact Sheet for Patients: EntrepreneurPulse.com.au  Fact Sheet for Healthcare Providers: IncredibleEmployment.be  This test is not yet approved or cleared by the [place] FDA and has been authorized for detection and/or diagnosis of SARS-CoV-2 by FDA under an Emergency Use Authorization (EUA). This EUA will remain in  effect (meaning this test can be used) for the duration of the COVID-19 declaration under Section 564(b)(1) of the Act, 21 U.S.C. section 360bbb-3(b)(1), unless the authorization is terminated or revoked.  Performed at [facility], [address] [place], [place] [address]   Blood Culture (routine x 2)     Status: Abnormal (Preliminary result)   Collection Time: 01/14/21  3:51 PM   Specimen: BLOOD LEFT ARM  Result Value Ref Range Status   Specimen Description   Final    BLOOD LEFT ARM Performed at [facility], [address] [place], [place] [address]    Special Requests   Final    BOTTLES DRAWN AEROBIC AND ANAEROBIC Blood Culture adequate volume Performed at [facility], [address] [place], [place] [address]    Culture  Setup Time (A)  Final    GRAM VARIABLE ROD AEROBIC BOTTLE ONLY CRITICAL VALUE NOTED.  VALUE IS CONSISTENT WITH PREVIOUSLY REPORTED AND CALLED VALUE.    Culture (A)  Final    GRAM VARIABLE ROD SENT TO [facility] FOR  IDENTIFICATION Performed at [facility], [place] [address], [place], [place] [address]    Report Status PENDING  Incomplete  Susceptibility, Aer + Anaerob     Status: None   Collection Time: 01/14/21  3:51 PM  Result Value Ref Range Status   Suscept, Aer + Anaerob Preliminary report  Final    Comment: (NOTE) Performed At: [facility] [place], [place] [id number] Rush Farmer MD [id number]    Source of Sample GRAM NEGATIVE ROD ISOLATED FROM BLOOD CULTURE  Final    Comment: Performed at [facility], [place] [address], [place], [place] [address]  Susceptibility Result     Status: None   Collection Time: 01/14/21  3:51 PM  Result Value Ref Range Status   Suscept Result 1 Comment  Final    Comment: (NOTE) Microbiological testing to rule out the presence of possible pathogens is in progress. Performed At: [facility] [place], [place] [id number] Rush Farmer MD [id number]   Respiratory (~20 pathogens) panel by PCR     Status: None   Collection Time: 01/14/21  7:22 PM   Specimen: Nasopharyngeal Swab; Respiratory  Result Value Ref Range Status   Adenovirus NOT DETECTED NOT DETECTED Final   Coronavirus 229E NOT DETECTED NOT DETECTED Final    Comment: (NOTE) The Coronavirus on the Respiratory Panel, DOES NOT test for the novel  Coronavirus (2019 nCoV)    Coronavirus HKU1 NOT DETECTED NOT DETECTED Final   Coronavirus NL63 NOT DETECTED NOT DETECTED Final   Coronavirus OC43 NOT DETECTED NOT DETECTED Final   Metapneumovirus NOT DETECTED NOT DETECTED Final   Rhinovirus / Enterovirus NOT DETECTED NOT DETECTED Final   Influenza A NOT DETECTED NOT DETECTED Final   Influenza B NOT DETECTED NOT DETECTED Final   Parainfluenza Virus 1 NOT DETECTED NOT DETECTED Final   Parainfluenza Virus 2 NOT DETECTED NOT DETECTED Final   Parainfluenza Virus 3 NOT DETECTED NOT DETECTED Final   Parainfluenza Virus 4 NOT DETECTED NOT DETECTED  Final   Respiratory Syncytial Virus NOT DETECTED NOT DETECTED Final   Bordetella pertussis NOT DETECTED NOT DETECTED Final   Bordetella Parapertussis NOT DETECTED NOT DETECTED Final   Chlamydophila pneumoniae NOT DETECTED NOT DETECTED Final   Mycoplasma pneumoniae NOT DETECTED NOT DETECTED Final    Comment: Performed at [facility], [address] [place], [place] [address]  MRSA PCR Screening     Status: None   Collection Time: 01/14/21 11:15 PM   Specimen: Nasopharyngeal  Result Value Ref Range Status   MRSA by PCR NEGATIVE NEGATIVE Final    Comment:        The GeneXpert MRSA Assay (FDA approved for NASAL specimens only), is one component of a comprehensive MRSA colonization surveillance program. It is not intended to diagnose MRSA infection nor to guide or monitor treatment for MRSA infections. Performed at [facility], [place] [address], [place], [place] [address]   Culture, blood (routine x 2)     Status: None   Collection Time: 01/16/21  9:43 AM   Specimen: BLOOD  Result Value Ref Range Status   Specimen Description   Final    BLOOD RIGHT ANTECUBITAL Performed at [place] [address], [place], [place] [address]    Special Requests   Final    BOTTLES DRAWN AEROBIC AND ANAEROBIC Blood Culture adequate volume Performed at [place] [address], [place], [place] [address]    Culture   Final    NO GROWTH 5 DAYS Performed at [facility], [place] [address] [place], [place] [address]    Report Status 01/21/2021 FINAL  Final  Culture, blood (routine x 2)     Status: None   Collection Time: 01/16/21  9:51 AM   Specimen: BLOOD  Result Value Ref Range Status   Specimen Description   Final    BLOOD LEFT ANTECUBITAL Performed at [place] [address], [place], [place] [address]    Special Requests   Final    BOTTLES DRAWN AEROBIC ONLY Blood Culture adequate  volume Performed at [place] [address] [place], [place] [address]    Culture   Final    NO GROWTH 5 DAYS Performed at [facility], [place] [address], [place], [place] [address]    Report Status 01/21/2021 FINAL  Final         Radiology Studies: No results found.      Scheduled Meds: . amLODipine  10 mg Oral Daily  . Chlorhexidine Gluconate Cloth  6 each Topical Daily  . lamoTRIgine  200 mg Oral Daily  . levothyroxine  75 mcg Oral Q0600  . mouth rinse  15 mL Mouth Rinse BID  . mirtazapine  15 mg Oral QHS   Followed by  . [START ON 01/24/2021] mirtazapine  30 mg Oral QHS  . oxybutynin  2.5 mg Oral BID  . pantoprazole  40 mg Oral Daily  . sodium chloride flush  10-40 mL Intracatheter Q12H  . tamsulosin  0.4 mg Oral Daily  . vancomycin  125 mg Oral QID   Continuous Infusions: . ceFEPime (MAXIPIME) IV 2 g (01/21/21 0814)  . potassium chloride 10 mEq (01/21/21 1103)     LOS: 5 days    Time spent:25 mins. More than 50% of that time was spent in counseling and/or coordination of care.      Shelly Coss, MD [facility] P6/02/2021, 11:54 AM

## 2021-01-22 LAB — CBC WITH DIFFERENTIAL/PLATELET
Abs Immature Granulocytes: 0.03 10*3/uL (ref 0.00–0.07)
Basophils Absolute: 0 10*3/uL (ref 0.0–0.1)
Basophils Relative: 1 %
Eosinophils Absolute: 0.1 10*3/uL (ref 0.0–0.5)
Eosinophils Relative: 2 %
HCT: 32.7 % — ABNORMAL LOW (ref 36.0–46.0)
Hemoglobin: 10.7 g/dL — ABNORMAL LOW (ref 12.0–15.0)
Immature Granulocytes: 1 %
Lymphocytes Relative: 23 %
Lymphs Abs: 1.2 10*3/uL (ref 0.7–4.0)
MCH: 29.4 pg (ref 26.0–34.0)
MCHC: 32.7 g/dL (ref 30.0–36.0)
MCV: 89.8 fL (ref 80.0–100.0)
Monocytes Absolute: 0.5 10*3/uL (ref 0.1–1.0)
Monocytes Relative: 9 %
Neutro Abs: 3.3 10*3/uL (ref 1.7–7.7)
Neutrophils Relative %: 64 %
Platelets: 211 10*3/uL (ref 150–400)
RBC: 3.64 MIL/uL — ABNORMAL LOW (ref 3.87–5.11)
RDW: 14 % (ref 11.5–15.5)
WBC: 5 10*3/uL (ref 4.0–10.5)
nRBC: 0 % (ref 0.0–0.2)

## 2021-01-22 LAB — BASIC METABOLIC PANEL
Anion gap: 6 (ref 5–15)
BUN: 16 mg/dL (ref 8–23)
CO2: 24 mmol/L (ref 22–32)
Calcium: 8.9 mg/dL (ref 8.9–10.3)
Chloride: 108 mmol/L (ref 98–111)
Creatinine, Ser: 0.74 mg/dL (ref 0.44–1.00)
GFR, Estimated: >60 mL/min (ref >60)
Glucose, Bld: 106 mg/dL — ABNORMAL HIGH (ref 70–99)
Potassium: 3.7 mmol/L (ref 3.5–5.1)
Sodium: 138 mmol/L (ref 135–145)

## 2021-01-22 LAB — MISC LABCORP TEST (SEND OUT): Labcorp test code: 8664

## 2021-01-22 MED ORDER — HEPARIN SOD (PORK) LOCK FLUSH 100 UNIT/ML IV SOLN
500.0000 [IU] | INTRAVENOUS | Status: AC | PRN
  Administered 2021-01-22: 500 [IU]
  Filled 2021-01-22: qty 5

## 2021-01-22 MED ORDER — SUMATRIPTAN SUCCINATE 50 MG PO TABS: 50.0000 mg | ORAL_TABLET | ORAL | 0 refills | Status: DC | PRN

## 2021-01-22 MED ORDER — AMLODIPINE BESYLATE 10 MG PO TABS: 10.0000 mg | ORAL_TABLET | Freq: Every day | ORAL | 1 refills | Status: DC

## 2021-01-22 MED ORDER — KETOROLAC TROMETHAMINE 15 MG/ML IJ SOLN
15.0000 mg | Freq: Once | INTRAMUSCULAR | Status: AC
  Administered 2021-01-22: 15 mg via INTRAVENOUS
  Filled 2021-01-22: qty 1

## 2021-01-22 MED ORDER — POTASSIUM CHLORIDE CRYS ER 20 MEQ PO TBCR
40.0000 meq | EXTENDED_RELEASE_TABLET | Freq: Once | ORAL | Status: AC
  Administered 2021-01-22: 40 meq via ORAL
  Filled 2021-01-22: qty 2

## 2021-01-22 MED ORDER — TAMSULOSIN HCL 0.4 MG PO CAPS: 0.4000 mg | ORAL_CAPSULE | Freq: Every day | ORAL | 1 refills | Status: DC

## 2021-01-22 MED ORDER — CEFEPIME IV (FOR PTA / DISCHARGE USE ONLY): 2.0000 g | Freq: Two times a day (BID) | INTRAVENOUS | 0 refills | Status: AC

## 2021-01-22 MED ORDER — OXYBUTYNIN CHLORIDE 5 MG/5ML PO SYRP: 2.5000 mg | ORAL_SOLUTION | Freq: Two times a day (BID) | ORAL | 0 refills | Status: DC

## 2021-01-22 MED ORDER — VANCOMYCIN HCL 125 MG PO CAPS: 125.0000 mg | ORAL_CAPSULE | Freq: Four times a day (QID) | ORAL | 0 refills | Status: DC

## 2021-01-22 NOTE — TOC Progression Note (Signed)
Transition of Care [facility]) - Progression Note    Patient Details  Name: Megan Brewer MRN: [id number] Date of Birth: [birth year]  Transition of Care [facility] CM/SW Contact  Purcell Mouton, RN Phone Number: 01/22/2021, 12:48 PM  Clinical Narrative:    Referral for IV ABX  Given to Pam, RN with [employer].    Expected Discharge Plan: Home/Self Care Barriers to Discharge: Continued Medical Work up  Expected Discharge Plan and Services Expected Discharge Plan: Home/Self Care   Discharge Planning Services: CM Consult   Living arrangements for the past 2 months: Single Family Home Expected Discharge Date: 01/22/21                                     Social Determinants of Health (SDOH) Interventions    Readmission Risk Interventions No flowsheet data found.

## 2021-01-22 NOTE — Discharge Summary (Signed)
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  COVID screen test was negative.  On presentation patient was febrile, tachycardic.  Lab work showed hypokalemia, elevated troponin.  Patient was confused on admission.  Patient was admitted for the management of severe sepsis.ID were following for positive blood culture.  Hospital course remarkable for persistent diarrhea secondary to C. difficile.  Started on oral Vanco.  Diarrhea has slowed down.  She will be discharged with cefepime till 6/15.  She is medically stable for discharge today.  Following problems were addressed during her hospitalization:  Severe sepsis: Presented with fever with evidence of end organ damage, confusion, troponin elevation.  Elevated procalcitonin .started on IV antibiotics, IV fluids.    Patient has history of immunoglobulin deficiency. Chest x-ray, CT renal did not show any acute findings.  No clear source of sepsis at present.  Venous Doppler negative for DVT.  She has a port on the left chest.  Blood culture in the aerobic bottle showed  gram variable rod,not exactly identified.  Currently on cefepime, ID recommending to continue cefepime 01/29/21.  Repeat blood cultures have been sent,NGTD  C. difficile diarrhea,PCR positive: She was seen by GI as an outpatient few days ago for diarrhea and her C. difficile PCR was positive on 01/14/21.  Started on vancomycin here  since she has relapse of diarrhoea .  Does not  complain of abdominal pain.  Diarrhea has slowed down.  She will continue taking oral vancomycin 10 days after finishing IV antibiotics  Acute metabolic encephalopathy: Most likely secondary to sepsis versus polypharmacy.  Much improved, currently alert and oriented. she was on multiple psychoactive medications at home which includes baclofen, Suboxone, Vistaril, Lamictal, Remeron, Requip, Zanaflex.CT head did not show any acute intracranial  abnormality.  Headache/back pain: Has history of migraines, has history of chronic back pain.  Continue supportive care.  Minimize sedatives.    No signs of CNS infection.  On sumatriptan as needed.  Given some doses of migraine cocktail.  UTI: Denies any dysuria but urine culture showing significant Enterococcus faecalis, was on  Zosyn,completed course.  Elevated troponin: Most likely secondary to supply demand ischemia from type II MI.  Echocardiogram showed ejection fraction of 55 to 60%, indeterminate left ventricular diastolic parameters.  Cardiologywere consulted.  No plan for intervention.Has history of left bundle branch block.  EKG did not show signs of any ischemic changes  History of hypogammaglobinemia: History of common variable immune deficiency.  Followed by Dr. London Pepper with hematology at [facility] every 3 weeks as an outpatient.  Hypothyroidism: Continue Synthyroid  Hypertension: Blood pressure stable on amlod 10 mg daily  Urine retention:Started on Flomax,foley inserted. Ultrasound of the kidneys showed urine in the bladder even with Foley.  Urology consulted, recommended to continue tamsulosin and follow-up as an outpatient.  She is history of chronic urinary retention.Foley now removed  Generalized weakness/debility/deconditioning: Home health recommended after  PT/OT evaluation.      Discharge Diagnoses:  Active Problems:   OBSTRUCTIVE SLEEP APNEA   Chronic pain syndrome    Hypogammaglobulinemia ([place])  Left bundle branch block   Restless legs syndrome   Chronic kidney disease   Hypothyroidism   SIRS (systemic inflammatory response syndrome) (HCC)   Elevated troponin   Hypokalemia   Headache   Acute metabolic encephalopathy    Discharge Instructions  Discharge Instructions    [facility] pharmacist to adjust dose for Vancomycin, Aminoglycosides and other anti-infective therapies as requested by physician.   Complete by: As directed    [facility] to provide Cath Flo 75m   Complete by: As directed    Administer for PICC line occlusion and as ordered by physician for other access device issues.   Anaphylaxis Kit: Provided to treat any anaphylactic reaction to the medication being provided to the patient if First Dose or when requested by physician   Complete by: As directed    Epinephrine 136mml vial / amp: Administer 0.51m40m0.51ml24mubcutaneously once for moderate to severe anaphylaxis, nurse to call physician and pharmacy when reaction occurs and call 911 if needed for immediate care   Diphenhydramine 50mg78mIV vial: Administer 25-50mg 51mM PRN for first dose reaction, rash, itching, mild reaction, nurse to call physician and pharmacy when reaction occurs   Sodium Chloride 0.9% NS 500ml I72mdminister if needed for hypovolemic blood pressure drop or as ordered by physician after call to physician with anaphylactic reaction   Change dressing on IV access line weekly and PRN   Complete by: As directed    Diet - low sodium heart healthy   Complete by: As directed    Discharge instructions   Complete by: As directed    1)Please take prescribed medications as instructed 2)Follow up with your PCP in a week.  Do a CBC, BMP during the follow-up.   Flush IV access with Sodium Chloride 0.9% and Heparin 10 units/ml or 100 units/ml   Complete by: As directed    Home infusion instructions - [facility]   Complete by: As  directed    Instructions: Flush IV access with Sodium Chloride 0.9% and Heparin 10units/ml or 100units/ml   Change dressing on IV access line: Weekly and PRN   Instructions Cath Flo 2mg: Ad651mister for PICC Line occlusion and as ordered by physician for other access device   [facility] pharmacist to adjust dose for: Vancomycin, Aminoglycosides and other anti-infective therapies as requested by physician   Increase activity slowly   Complete by: As directed    Method of administration may be changed at the discretion of home infusion pharmacist based upon assessment of the patient and/or caregiver's ability to self-administer the medication ordered   Complete by: As directed    Outpatient Parenteral Antibiotic Therapy Information Antibiotic: Cefepime (Maxipime) IVPB; Indications for use: sepsis; End Date: 01/29/2021   Complete by: As directed    Antibiotic: Cefepime (Maxipime) IVPB   Indications for use: sepsis   End Date: 01/29/2021     Allergies as of 01/22/2021      Reactions   Tetracycline Nausea Only   Causes ringing in ears, dizziness, migraine, nausea   Corticosteroids Other (See Comments)   12/02/2016 interview by [name]: OraMelissa Montaneosing causes migraines/nausea/tinnitus. Prev tolerated IV and intranasal admin w/o difficulty.   Cefixime Other (See Comments)   Headache   Ciprofloxacin Other (See Comments)   Headache, dizziness, ringing in the ears   Doxycycline Other (See Comments)   unknown   Gabapentin    Throat swells/closes   Isometheptene-dichloral-apap Other (See Comments)   unknown   Ketorolac  12/02/2016 interview by Melissa Montane: Oral dosing prednisone/corticosteroids causes migraines/nausea/tinnitus. Prev tolerated IV and intranasal admin w/o difficulty.   Prednisone Other (See Comments)   Migraine, dizzy, ringing in ears-can take it IV 12/02/2016 interview by [name]: Oral prednisone dosing causes migraines/nausea/tinnitus. Prev tolerated IV and intranasal admin w/o  difficulty.   Promethazine Other (See Comments)   causes severe abdominal pain when taken IV; however she can take PO or IM   Stadol [butorphanol]    Cerebral pain   Erythromycin Base Diarrhea, Itching   Severe diarrhea   Propoxyphene Nausea Only      Medication List    TAKE these medications   acetaminophen 500 MG tablet Commonly known as: TYLENOL Take 1-2 tablets (500-1,000 mg total) by mouth every 8 (eight) hours as needed (Max dose 3000 mg in 24 hours).   Aimovig (140 MG Dose) 70 MG/ML Soaj Generic drug: Erenumab-aooe Inject into the skin every 30 (thirty) days.   amLODipine 10 MG tablet Commonly known as: NORVASC Take 1 tablet (10 mg total) by mouth daily. Start taking on: January 23, 2021   Azelastine-Fluticasone 137-50 MCG/ACT Susp ONE SPRAY EACH NOSTRIL TWICE A DAY AS NEEDED What changed:   how much to take  how to take this  when to take this  reasons to take this   baclofen 10 MG tablet Commonly known as: LIORESAL Take 10 mg by mouth daily as needed for muscle spasms.   Buprenorphine HCl-Naloxone HCl 4-1 MG Film Place 1 strip under the tongue 2 (two) times daily.   Calcium Citrate 200 MG Tabs Take 1 tablet by mouth daily.   ceFEPime  IVPB Commonly known as: MAXIPIME Inject 2 g into the vein every 12 (twelve) hours for 9 days. Indication:  Bacteremia First Dose: No Last Day of Therapy:  01/29/2021 Labs - Once weekly:  CBC/D and BMP, Labs - Every other week:  ESR and CRP Method of administration: IV Push Method of administration may be changed at the discretion of home infusion pharmacist based upon assessment of the patient and/or caregiver's ability to self-administer the medication ordered.   cholecalciferol 25 MCG (1000 UNIT) tablet Commonly known as: VITAMIN D3 Take 1,000 Units by mouth daily.   cycloSPORINE 0.05 % ophthalmic emulsion Commonly known as: RESTASIS Place 1 drop into both eyes 2 (two) times daily.   dexlansoprazole 60 MG  capsule Commonly known as: Dexilant Take 1 capsule (60 mg total) by mouth daily. Crack open the capsule and pour into spoon prior to ingestion. PLEASE SCHEDULE AN OFFICE VISIT FOR FURTHER REFILLS. Thank you   estradiol 0.1 MG/GM vaginal cream Commonly known as: ESTRACE Place 1 Applicatorful vaginally 3 (three) times a week.   ferrous sulfate 325 (65 FE) MG tablet Take 650 mg by mouth daily with breakfast.   hydrOXYzine 25 MG capsule Commonly known as: VISTARIL TAKE ONE CAPSULE BY MOUTH TWICE DAILY AS NEEDED What changed: reasons to take this   ibuprofen 200 MG tablet Commonly known as: ADVIL Take 200 mg by mouth every 6 (six) hours as needed for mild pain.   immune globulin (human) 10 g injection Commonly known as: GAMMAGARD S/D LESS IGA Inject into the vein every 21 ( twenty-one) days.   lamoTRIgine 200 MG tablet Commonly known as: LAMICTAL TAKE 1 TABLET BY MOUTH DAILY What changed: when to take this   Lastacaft 0.25 % Soln Generic drug: Alcaftadine Apply 1 drop to eye daily.   levothyroxine 75 MCG tablet Commonly known as: SYNTHROID Take one tablet  by mouth once daily 30 minutes before breakfast on empty stomach.   magnesium oxide 400 MG tablet Commonly known as: MAG-OX Take 400 mg by mouth daily.   mirtazapine 30 MG tablet Commonly known as: Remeron Take 1/2 tablet at bedtime for one week, then increase to 1 tablet at bedtime   oxybutynin 5 MG/5ML syrup Commonly known as: DITROPAN Take 2.5 mLs (2.5 mg total) by mouth 2 (two) times daily.   potassium chloride SA 20 MEQ tablet Commonly known as: KLOR-CON TAKE TWO TABLETS BY MOUTH DAILY (MAY DISSOLVE IN WATER)   rOPINIRole 1 MG tablet Commonly known as: REQUIP TAKE 1 TABLET BY MOUTH AT BEDTIME   saccharomyces boulardii 250 MG capsule Commonly known as: Florastor Take 1 capsule (250 mg total) by mouth 2 (two) times daily.   sucralfate 1 GM/10ML suspension Commonly known as: CARAFATE Take 2 tsp by mouth  three times daily as needed. What changed:   how much to take  how to take this  when to take this  reasons to take this  additional instructions   SUMAtriptan 50 MG tablet Commonly known as: IMITREX Take 1 tablet (50 mg total) by mouth every 2 (two) hours as needed for migraine or headache. May repeat in 2 hours if headache persists or recurs.   tamsulosin 0.4 MG Caps capsule Commonly known as: FLOMAX Take 1 capsule (0.4 mg total) by mouth daily. Start taking on: January 23, 2021   tiZANidine 4 MG tablet Commonly known as: Zanaflex Take 1 tablet (4 mg total) by mouth every 6 (six) hours as needed for muscle spasms.   torsemide 20 MG tablet Commonly known as: DEMADEX TAKE 1 TABLET BY MOUTH DAILY   triamcinolone cream 0.1 % Commonly known as: KENALOG Apply 1 application topically 2 (two) times daily as needed (rash).   UNABLE TO FIND Med Name: [name] 1 by mouth daily for UTI prevention   vancomycin 125 MG capsule Commonly known as: VANCOCIN Take 1 capsule (125 mg total) by mouth 4 (four) times daily for 17 days.   ZINC OXIDE (TOPICAL) 10 % Crea Apply 1 application topically in the morning and at bedtime. What changed:   when to take this  reasons to take this            Discharge Care Instructions  (From admission, onward)         Start     Ordered   01/22/21 0000  Change dressing on IV access line weekly and PRN  (Home infusion instructions - [facility] )        01/22/21 1228          Follow-up Information    Minus Breeding, MD Follow up on 02/20/2021.   Specialty: Cardiology Why: at [facility] for your cardiology follow up appointment  Contact information: [address] [place] [address] [place], [facility] Follow up.   Specialty: Home Health Services Why: [name] will follow you at home for Home Health, please call them if you have any questions or concerns.  Contact information: [address] [place] [address] [phone]        [name], NP. Schedule an appointment as soon as possible for a visit in 1 week(s).   Specialty: Geriatric Medicine Contact information: [address] [place] [address] [phone]              Allergies  Allergen  Reactions  . Tetracycline Nausea Only    Causes ringing in ears, dizziness, migraine, nausea  . Corticosteroids Other (See Comments)    12/02/2016 interview by Melissa Montane: Oral dosing causes migraines/nausea/tinnitus. Prev tolerated IV and intranasal admin w/o difficulty.  . Cefixime Other (See Comments)    Headache   . Ciprofloxacin Other (See Comments)    Headache, dizziness, ringing in the ears  . Doxycycline Other (See Comments)    unknown  . Gabapentin     Throat swells/closes  . Isometheptene-Dichloral-Apap Other (See Comments)    unknown  . Ketorolac     12/02/2016 interview by SWN: Oral dosing prednisone/corticosteroids causes migraines/nausea/tinnitus. Prev tolerated IV and intranasal admin w/o difficulty.  . Prednisone Other (See Comments)    Migraine, dizzy, ringing in ears-can take it IV 12/02/2016 interview by [name]: Oral prednisone dosing causes migraines/nausea/tinnitus. Prev tolerated IV and intranasal admin w/o difficulty.  . Promethazine Other (See Comments)    causes severe abdominal pain when taken IV; however she can take PO or IM  . Stadol [Butorphanol]     Cerebral pain  . Erythromycin Base Diarrhea and Itching    Severe diarrhea  . Propoxyphene Nausea Only    Consultations:  Urology,ID   Procedures/Studies: DG Chest 2 View  Result Date: 01/11/2021 CLINICAL DATA:  75 year old female with shortness of breath. EXAM: CHEST - 2 VIEW COMPARISON:  12/09/2020, 03/29/2019 FINDINGS: The mediastinal contours are within normal limits. No cardiomegaly. Unchanged position of left chest wall subclavian Port-A-Cath with the tip in the right atrium. Similar appearing spinal cord  stimulators from recent comparison chest CT. Trace right pleural effusion. No focal consolidations. No pneumothorax. No acute osseous abnormality. Cholecystectomy clips in the right upper quadrant. IMPRESSION: Trace right pleural effusion. Electronically Signed   By: Ruthann Cancer MD   On: 01/11/2021 11:59   CT HEAD WO CONTRAST  Result Date: 01/15/2021 CLINICAL DATA:  Headache, nausea, somnolent EXAM: CT HEAD WITHOUT CONTRAST TECHNIQUE: Contiguous axial images were obtained from the base of the skull through the vertex without intravenous contrast. COMPARISON:  08/20/2020 FINDINGS: Brain: No acute infarct or hemorrhage. Lateral ventricles and midline structures are unremarkable. No acute extra-axial fluid collections. No mass effect. Vascular: No hyperdense vessel or unexpected calcification. Skull: Normal. Negative for fracture or focal lesion. Sinuses/Orbits: Stable postsurgical changes of the bilateral maxillary sinuses and ethmoid air cells. Chronic mucosal thickening within the maxillary, ethmoid, and sphenoid sinuses. Other: None. IMPRESSION: 1. No acute intracranial process. 2. Chronic paranasal sinus disease as above. Electronically Signed   By: Randa Ngo M.D.   On: 01/15/2021 01:32   US RENAL  Result Date: 01/17/2021 CLINICAL DATA:  Urinary retention EXAM: RENAL / URINARY TRACT ULTRASOUND COMPLETE COMPARISON:  CT abdomen and pelvis Jan 14, 2021 FINDINGS: Right Kidney: Renal measurements: 9.1 x 3.9 x 4.2 cm = volume: 77.6 mL. Echogenicity and renal cortical thickness are within normal limits. No mass, perinephric fluid, or hydronephrosis visualized. No sonographically demonstrable calculus or ureterectasis. Left Kidney: Renal measurements: 9.6 x 4.7 x 5.8 cm = volume: 135.8 mL. Echogenicity and renal cortical thickness are within normal limits. No mass, perinephric fluid, or hydronephrosis visualized. No sonographically demonstrable calculus or ureterectasis. Bladder: Appears normal for degree of  bladder distention. Note that there is a Foley catheter in the bladder. There is still significant volume of urine in the bladder. Flow from each distal ureter seen within the bladder. Other: Mild ascites noted. IMPRESSION: Normal appearing kidneys bilaterally. There is moderate urine in the  bladder with Foley catheter in place. No bladder wall thickening, focal urinary bladder lesion appreciable by ultrasound. Flow from each distal ureter seen within the bladder. Mild ascites noted. Electronically Signed   By: Lowella Grip III M.D.   On: 01/17/2021 20:20   US Venous Img Lower Right (DVT Study)  Result Date: 01/11/2021 CLINICAL DATA:  Right lower extremity swelling for 1 month. EXAM: RIGHT LOWER EXTREMITY VENOUS DOPPLER ULTRASOUND TECHNIQUE: Gray-scale sonography with compression, as well as color and duplex ultrasound, were performed to evaluate the deep venous system(s) from the level of the common femoral vein through the popliteal and proximal calf veins. COMPARISON:  None. FINDINGS: VENOUS Normal compressibility of the common femoral, superficial femoral, and popliteal veins, as well as the visualized calf veins. Visualized portions of profunda femoral vein and great saphenous vein unremarkable. No filling defects to suggest DVT on grayscale or color Doppler imaging. Doppler waveforms show normal direction of venous flow, normal respiratory plasticity and response to augmentation. Limited views of the contralateral common femoral vein are unremarkable. OTHER None. Limitations: none IMPRESSION: No evidence of a right lower extremity deep venous thrombosis. Electronically Signed   By: Lajean Manes M.D.   On: 01/11/2021 13:40   DG CHEST PORT 1 VIEW  Result Date: 01/14/2021 CLINICAL DATA:  Shortness of breath EXAM: PORTABLE CHEST 1 VIEW COMPARISON:  Film from earlier in the same day. FINDINGS: A cardiac shadow is stable. Left chest wall port is noted and stable. Postsurgical changes in the right lung  are noted. Spinal stimulators are seen. Lungs are clear. IMPRESSION: No acute abnormality noted. Electronically Signed   By: Inez Catalina M.D.   On: 01/14/2021 22:28   DG Chest Port 1 View  Result Date: 01/14/2021 CLINICAL DATA:  Sepsis. EXAM: PORTABLE CHEST 1 VIEW COMPARISON:  Jan 11, 2021. FINDINGS: The heart size and mediastinal contours are within normal limits. Left subclavian Port-A-Cath is unchanged in position. No pneumothorax or pleural effusion is noted. Left lung is clear. Right infrahilar postoperative changes are again noted with associated scarring. The visualized skeletal structures are unremarkable. IMPRESSION: No definite acute abnormality Electronically Signed   By: Marijo Conception M.D.   On: 01/14/2021 15:39   ECHOCARDIOGRAM COMPLETE  Result Date: 01/15/2021    ECHOCARDIOGRAM REPORT   Patient Name:   Megan Brewer Date of Exam: 01/15/2021 Medical Rec #:  [id number]          Height:       63.0 in Accession #:    [id number]         Weight:       115.0 lb Date of Birth:  01/17/1946         BSA:          1.528 m Patient Age:    42 years           BP:           175/109 mmHg Patient Gender: F                  HR:           89 bpm. Exam Location:  Inpatient Procedure: 2D Echo, Cardiac Doppler and Color Doppler Indications:    Elevated Troponin  History:        Patient has no prior history of Echocardiogram examinations.                 Risk Factors:Sleep Apnea. Chills, fever, headache, nausea,  abdominal pain, chest pain, dyspnea, and leg swelling. Acute                 metabolic encephalopathy likely from sepsis. Common variable                 immune deficiency. Thyroid disease. GERD. Chronic kidney                 disease.  Sonographer:    Darlina Sicilian RDCS Referring Phys: [phone] ANASTASSIA DOUTOVA  Sonographer Comments: Suboptimal parasternal window, suboptimal apical window and Technically difficult study due to poor echo windows. IMPRESSIONS  1. Non-traditional imaging  windows limit the diagostic yield of this exam. Measurements and assessment of valvular heart disease are approximate values.  2. Left ventricular ejection fraction, by estimation, is 55 to 60%. The left ventricle has normal function. Left ventricular endocardial border not optimally defined to evaluate regional wall motion. Left ventricular diastolic parameters are indeterminate.  3. Right ventricular systolic function is normal. The right ventricular size is normal. There is normal pulmonary artery systolic pressure. The estimated right ventricular systolic pressure is 68.3 mmHg.  4. The mitral valve is degenerative. Mild to moderate mitral valve regurgitation. Moderate mitral annular calcification.  5. Tricuspid valve regurgitation is mild to moderate.  6. The aortic valve is grossly normal. Aortic valve regurgitation is mild to moderate. No aortic stenosis by visual assessment of the valve, Doppler assessment suboptimal.  7. The inferior vena cava is normal in size with greater than 50% respiratory variability, suggesting right atrial pressure of 3 mmHg. FINDINGS  Left Ventricle: Left ventricular ejection fraction, by estimation, is 55 to 60%. The left ventricle has normal function. Left ventricular endocardial border not optimally defined to evaluate regional wall motion. The left ventricular internal cavity size was normal in size. There is no left ventricular hypertrophy. Left ventricular diastolic parameters are indeterminate. Right Ventricle: The right ventricular size is normal. No increase in right ventricular wall thickness. Right ventricular systolic function is normal. There is normal pulmonary artery systolic pressure. The tricuspid regurgitant velocity is 2.84 m/s, and  with an assumed right atrial pressure of 3 mmHg, the estimated right ventricular systolic pressure is 72.9 mmHg. Left Atrium: Left atrial size was normal in size. Right Atrium: Right atrial size was normal in size. Pericardium: There  is no evidence of pericardial effusion. Mitral Valve: The mitral valve is degenerative in appearance. Moderate mitral annular calcification. Mild to moderate mitral valve regurgitation. Tricuspid Valve: The tricuspid valve is grossly normal. Tricuspid valve regurgitation is mild to moderate. Aortic Valve: The aortic valve is grossly normal. Aortic valve regurgitation is mild to moderate. No aortic stenosis is present. Pulmonic Valve: The pulmonic valve was not well visualized. Pulmonic valve regurgitation is trivial. No evidence of pulmonic stenosis. Aorta: The aortic root was not well visualized. Venous: The inferior vena cava is normal in size with greater than 50% respiratory variability, suggesting right atrial pressure of 3 mmHg. IAS/Shunts: No atrial level shunt detected by color flow Doppler.  LEFT VENTRICLE PLAX 2D LVOT diam:     1.80 cm  Diastology LV SV:         43       LV e' medial:    6.64 cm/s LV SV Index:   28       LV E/e' medial:  12.5 LVOT Area:     2.54 cm LV e' lateral:   6.64 cm/s  LV E/e' lateral: 12.5  RIGHT VENTRICLE RV S prime:     12.10 cm/s TAPSE (M-mode): 2.2 cm LEFT ATRIUM             Index       RIGHT ATRIUM          Index LA Vol (A2C):   31.8 ml 20.81 ml/m RA Area:     8.13 cm LA Vol (A4C):   34.9 ml 22.83 ml/m RA Volume:   13.80 ml 9.03 ml/m LA Biplane Vol: 33.8 ml 22.11 ml/m  AORTIC VALVE LVOT Vmax:   94.90 cm/s LVOT Vmean:  62.400 cm/s LVOT VTI:    0.170 m  AORTA Ao Root diam: 2.60 cm MITRAL VALVE                TRICUSPID VALVE MV Area (PHT): 3.42 cm     TR Peak grad:   32.3 mmHg MV Decel Time: 222 msec     TR Vmax:        284.00 cm/s MV E velocity: 83.20 cm/s MV A velocity: 110.00 cm/s  SHUNTS MV E/A ratio:  0.76         Systemic VTI:  0.17 m                             Systemic Diam: 1.80 cm Cherlynn Kaiser MD Electronically signed by Cherlynn Kaiser MD Signature Date/Time: 01/15/2021/9:57:32 AM    Final    CT Renal Stone Study  Result Date:  01/14/2021 CLINICAL DATA:  Abdomen pain hematuria EXAM: CT ABDOMEN AND PELVIS WITHOUT CONTRAST TECHNIQUE: Multidetector CT imaging of the abdomen and pelvis was performed following the standard protocol without IV contrast. COMPARISON:  CT 12/09/2020 FINDINGS: Lower chest: Lung bases demonstrate chronic right pleural thickening with calcifications. Slight rightward deviation of distal esophagus which contains radiopaque material. Hepatobiliary: Status post cholecystectomy. Stable intra and extrahepatic biliary dilatation. No focal hepatic abnormality. Pancreas: Unremarkable. No pancreatic ductal dilatation or surrounding inflammatory changes. Spleen: Normal in size without focal abnormality. Adrenals/Urinary Tract: Thickened adrenal glands without dominant mass. Kidneys show no hydronephrosis or ureteral stone. Distended urinary bladder Stomach/Bowel: Status post gastric bypass. No obstructive changes. No acute bowel wall thickening. Vascular/Lymphatic: Moderate aortic atherosclerosis. No aneurysm. No suspicious nodes Reproductive: Status post hysterectomy. No adnexal masses. Other: Negative for pelvic effusion or free air. Musculoskeletal: No acute osseous abnormality. Left-sided sacral stimulator. Right posterior ascending thoracic stimulator with lead entering the canal at approximate T10 level. IMPRESSION: 1. No CT evidence for acute intra-abdominal or pelvic abnormality. 2. Chronic right pleural thickening with calcifications 3. Status post gastric bypass without adverse features at this time. Electronically Signed   By: Donavan Foil M.D.   On: 01/14/2021 20:05   VAS [place] LOWER EXTREMITY VENOUS (DVT)  Result Date: 01/15/2021  Lower Venous DVT Study Patient Name:  Megan Brewer  Date of Exam:   01/15/2021 Medical Rec #: [id number]           Accession #:    [id number] Date of Birth: November 19, 1945          Patient Gender: F Patient Age:   74Y Exam Location:  [facility] Procedure:      VAS [place] LOWER  EXTREMITY VENOUS (DVT) Referring Phys: [place] --------------------------------------------------------------------------------  Indications: Edema.  Risk Factors: None identified. Comparison Study: 01/11/2021 - Negative for DVT on the right. Performing Technologist: Oliver Hum RVT  Examination Guidelines: A complete  evaluation includes B-mode imaging, spectral Doppler, color Doppler, and power Doppler as needed of all accessible portions of each vessel. Bilateral testing is considered an integral part of a complete examination. Limited examinations for reoccurring indications may be performed as noted. The reflux portion of the exam is performed with the patient in reverse Trendelenburg.  +-----+---------------+---------+-----------+----------+--------------+ RIGHTCompressibilityPhasicitySpontaneityPropertiesThrombus Aging +-----+---------------+---------+-----------+----------+--------------+ CFV  Full           Yes      Yes                                 +-----+---------------+---------+-----------+----------+--------------+   +---------+---------------+---------+-----------+----------+--------------+ LEFT     CompressibilityPhasicitySpontaneityPropertiesThrombus Aging +---------+---------------+---------+-----------+----------+--------------+ CFV      Full           Yes      Yes                                 +---------+---------------+---------+-----------+----------+--------------+ SFJ      Full                                                        +---------+---------------+---------+-----------+----------+--------------+ FV Prox  Full                                                        +---------+---------------+---------+-----------+----------+--------------+ FV Mid   Full                                                        +---------+---------------+---------+-----------+----------+--------------+ FV DistalFull                                                         +---------+---------------+---------+-----------+----------+--------------+ PFV      Full                                                        +---------+---------------+---------+-----------+----------+--------------+ POP      Full           Yes      Yes                                 +---------+---------------+---------+-----------+----------+--------------+ PTV      Full                                                        +---------+---------------+---------+-----------+----------+--------------+  PERO     Full                                                        +---------+---------------+---------+-----------+----------+--------------+     Summary: RIGHT: - No evidence of common femoral vein obstruction.  LEFT: - There is no evidence of deep vein thrombosis in the lower extremity.  - No cystic structure found in the popliteal fossa.  *See table(s) above for measurements and observations. Electronically signed by Harold Barban MD on 01/15/2021 at 10:18:34 PM.    Final        Subjective:  Patient seen and examined the bedside this morning.  Medically stable for discharge today.  I called the husband and we discussed about discharge planning   Discharge Exam: Vitals:   01/21/21 2008 01/22/21 0614  BP: 140/61 (!) 152/82  Pulse: 79 89  Resp: 17 18  Temp: 97.6 F (36.4 C) 97.6 F (36.4 C)  SpO2: 99% 100%   Vitals:   01/21/21 0521 01/21/21 1406 01/21/21 2008 01/22/21 0614  BP: (!) 143/66 (!) 115/50 140/61 (!) 152/82  Pulse: 81 88 79 89  Resp: _0 Temp: 98.2 F (36.8 C) 97.6 F (36.4 C) 97.6 F (36.4 C) 97.6 F (36.4 C)  TempSrc: Oral Axillary Oral Oral  SpO2: 100% 100% 99% 100%  Weight:      Height:        General: Pt is alert, awake, not in acute distress Cardiovascular: RRR, S1/S2 +, no rubs, no gallops Respiratory: CTA bilaterally, no wheezing, no rhonchi Abdominal: Soft, NT, ND, bowel  sounds + Extremities: no edema, no cyanosis    The results of significant diagnostics from this hospitalization (including imaging, microbiology, ancillary and laboratory) are listed below for reference.     Microbiology: Recent Results (from the past 240 hour(s))  Clostridium Difficile by PCR     Status: Abnormal   Collection Time: 01/14/21 10:00 AM   Specimen: STOOL   Stool  Result Value Ref Range Status   Toxigenic C. Difficile by PCR Positive (A) Negative Final    Comment: Toxigenic C difficile: Positive Epidemic Strain Bl/NAP1/027: Presumptive Negative   Blood Culture (routine x 2)     Status: Abnormal (Preliminary result)   Collection Time: 01/14/21  3:46 PM   Specimen: BLOOD  Result Value Ref Range Status   Specimen Description   Final    BLOOD LEFT CHEST PORTA CATH Performed at [facility], [address] [place], [place] [address]    Special Requests   Final    BOTTLES DRAWN AEROBIC AND ANAEROBIC Blood Culture adequate volume Performed at [facility], [address] [place], [place] [address]    Culture  Setup Time (A)  Final    GRAM VARIABLE ROD AEROBIC BOTTLE ONLY CRITICAL RESULT CALLED TO, READ BACK BY AND VERIFIED WITH: PHRMD J LEGGE [id number] AT 1059 AM BY CM    Culture (A)  Final    GRAM VARIABLE ROD SENT TO [facility] FOR IDENTIFICATION Sent to [employer] for further susceptibility testing. Performed at [facility], [place] [address], [place], [place] [address]    Report Status PENDING  Incomplete  Blood Culture ID Panel (Reflexed)     Status: None   Collection Time: 01/14/21  3:46  PM  Result Value Ref Range Status   Enterococcus faecalis NOT DETECTED NOT DETECTED Final   Enterococcus Faecium NOT DETECTED NOT DETECTED Final   Listeria monocytogenes NOT DETECTED NOT DETECTED Final   Staphylococcus species NOT DETECTED NOT DETECTED Final   Staphylococcus aureus (BCID) NOT DETECTED NOT DETECTED Final   Staphylococcus epidermidis NOT  DETECTED NOT DETECTED Final   Staphylococcus lugdunensis NOT DETECTED NOT DETECTED Final   Streptococcus species NOT DETECTED NOT DETECTED Final   Streptococcus agalactiae NOT DETECTED NOT DETECTED Final   Streptococcus pneumoniae NOT DETECTED NOT DETECTED Final   Streptococcus pyogenes NOT DETECTED NOT DETECTED Final   A.calcoaceticus-baumannii NOT DETECTED NOT DETECTED Final   Bacteroides fragilis NOT DETECTED NOT DETECTED Final   Enterobacterales NOT DETECTED NOT DETECTED Final   Enterobacter cloacae complex NOT DETECTED NOT DETECTED Final   Escherichia coli NOT DETECTED NOT DETECTED Final   Klebsiella aerogenes NOT DETECTED NOT DETECTED Final   Klebsiella oxytoca NOT DETECTED NOT DETECTED Final   Klebsiella pneumoniae NOT DETECTED NOT DETECTED Final   Proteus species NOT DETECTED NOT DETECTED Final   Salmonella species NOT DETECTED NOT DETECTED Final   Serratia marcescens NOT DETECTED NOT DETECTED Final   Haemophilus influenzae NOT DETECTED NOT DETECTED Final   Neisseria meningitidis NOT DETECTED NOT DETECTED Final   Pseudomonas aeruginosa NOT DETECTED NOT DETECTED Final   Stenotrophomonas maltophilia NOT DETECTED NOT DETECTED Final   Candida albicans NOT DETECTED NOT DETECTED Final   Candida auris NOT DETECTED NOT DETECTED Final   Candida glabrata NOT DETECTED NOT DETECTED Final   Candida krusei NOT DETECTED NOT DETECTED Final   Candida parapsilosis NOT DETECTED NOT DETECTED Final   Candida tropicalis NOT DETECTED NOT DETECTED Final   Cryptococcus neoformans/gattii NOT DETECTED NOT DETECTED Final    Comment: Performed at [facility], [address], [place], [place] [address]  Urine culture     Status: Abnormal   Collection Time: 01/14/21  3:47 PM   Specimen: Urine, Catheterized  Result Value Ref Range Status   Specimen Description   Final    IN/OUT CATH URINE Performed at [facility], [address] [place], [place] [address]    Special Requests   Final     NONE Performed at [facility], [address] [place], [place] [address]    Culture >=100,000 COLONIES/mL ENTEROCOCCUS FAECALIS (A)  Final   Report Status 01/17/2021 FINAL  Final   Organism ID, Bacteria ENTEROCOCCUS FAECALIS (A)  Final      Susceptibility   Enterococcus faecalis - MIC*    AMPICILLIN <=2 SENSITIVE Sensitive     NITROFURANTOIN <=16 SENSITIVE Sensitive     VANCOMYCIN 1 SENSITIVE Sensitive     * >=100,000 COLONIES/mL ENTEROCOCCUS FAECALIS  Resp Panel by RT-PCR (Flu A&B, Covid) Nasopharyngeal Swab     Status: None   Collection Time: 01/14/21  3:47 PM   Specimen: Nasopharyngeal Swab; Nasopharyngeal(NP) swabs in vial transport medium  Result Value Ref Range Status   SARS Coronavirus 2 by RT PCR NEGATIVE NEGATIVE Final    Comment: (NOTE) SARS-CoV-2 target nucleic acids are NOT DETECTED.  The SARS-CoV-2 RNA is generally detectable in upper respiratory specimens during the acute phase of infection. The lowest concentration of SARS-CoV-2 viral copies this assay can detect is 138 copies/mL. A negative result does not preclude SARS-Cov-2 infection and should not be used as the sole basis for treatment or other patient management decisions. A negative result may  occur with  improper specimen collection/handling, submission of specimen other than nasopharyngeal swab, presence of viral mutation(s) within the areas targeted by this assay, and inadequate number of viral copies(<138 copies/mL). A negative result must be combined with clinical observations, patient history, and epidemiological information. The expected result is Negative.  Fact Sheet for Patients:  EntrepreneurPulse.com.au  Fact Sheet for Healthcare Providers:  IncredibleEmployment.be  This test is no t yet approved or cleared by the [place] FDA and  has been authorized for detection and/or diagnosis of SARS-CoV-2 by FDA under an Emergency Use  Authorization (EUA). This EUA will remain  in effect (meaning this test can be used) for the duration of the COVID-19 declaration under Section 564(b)(1) of the Act, 21 U.S.C.section 360bbb-3(b)(1), unless the authorization is terminated  or revoked sooner.       Influenza A by PCR NEGATIVE NEGATIVE Final   Influenza B by PCR NEGATIVE NEGATIVE Final    Comment: (NOTE) The Xpert Xpress SARS-CoV-2/FLU/RSV plus assay is intended as an aid in the diagnosis of influenza from Nasopharyngeal swab specimens and should not be used as a sole basis for treatment. Nasal washings and aspirates are unacceptable for Xpert Xpress SARS-CoV-2/FLU/RSV testing.  Fact Sheet for Patients: EntrepreneurPulse.com.au  Fact Sheet for Healthcare Providers: IncredibleEmployment.be  This test is not yet approved or cleared by the [place] FDA and has been authorized for detection and/or diagnosis of SARS-CoV-2 by FDA under an Emergency Use Authorization (EUA). This EUA will remain in effect (meaning this test can be used) for the duration of the COVID-19 declaration under Section 564(b)(1) of the Act, 21 U.S.C. section 360bbb-3(b)(1), unless the authorization is terminated or revoked.  Performed at [facility], [address] [place], [place] [address]   Blood Culture (routine x 2)     Status: Abnormal (Preliminary result)   Collection Time: 01/14/21  3:51 PM   Specimen: BLOOD LEFT ARM  Result Value Ref Range Status   Specimen Description   Final    BLOOD LEFT ARM Performed at [facility], [address] [place], [place] [address]    Special Requests   Final    BOTTLES DRAWN AEROBIC AND ANAEROBIC Blood Culture adequate volume Performed at [facility], [address] [place], [place] [address]    Culture  Setup Time (A)  Final    GRAM VARIABLE ROD AEROBIC BOTTLE ONLY CRITICAL VALUE NOTED.  VALUE IS CONSISTENT WITH  PREVIOUSLY REPORTED AND CALLED VALUE.    Culture (A)  Final    GRAM VARIABLE ROD SENT TO [facility] FOR IDENTIFICATION Performed at [facility], [place] [address], [place], [place] [address]    Report Status PENDING  Incomplete  Susceptibility, Aer + Anaerob     Status: Abnormal   Collection Time: 01/14/21  3:51 PM  Result Value Ref Range Status   Suscept, Aer + Anaerob Preliminary report (A)  Final    Comment: (NOTE) Performed At: [facility] [place], [place] [id number] Rush Farmer MD LA:[id number]    Source of Sample GRAM NEGATIVE ROD ISOLATED FROM BLOOD CULTURE  Final    Comment: Performed at [facility], [place] [address], [place], [place] [address]  Susceptibility Result     Status: Abnormal   Collection Time: 01/14/21  3:51 PM  Result Value Ref Range Status   Suscept Result 1 Gram negative rods (A)  Corrected    Comment: (NOTE) Performed  At: [facility] [place] [address] [place], [place] [id number] Rush Farmer MD YK:[id number] CORRECTED ON 06/08 AT 0740: PREVIOUSLY REPORTED AS Comment   Respiratory (~20 pathogens) panel by PCR     Status: None   Collection Time: 01/14/21  7:22 PM   Specimen: Nasopharyngeal Swab; Respiratory  Result Value Ref Range Status   Adenovirus NOT DETECTED NOT DETECTED Final   Coronavirus 229E NOT DETECTED NOT DETECTED Final    Comment: (NOTE) The Coronavirus on the Respiratory Panel, DOES NOT test for the novel  Coronavirus (2019 nCoV)    Coronavirus HKU1 NOT DETECTED NOT DETECTED Final   Coronavirus NL63 NOT DETECTED NOT DETECTED Final   Coronavirus OC43 NOT DETECTED NOT DETECTED Final   Metapneumovirus NOT DETECTED NOT DETECTED Final   Rhinovirus / Enterovirus NOT DETECTED NOT DETECTED Final   Influenza A NOT DETECTED NOT DETECTED Final   Influenza B NOT DETECTED NOT DETECTED Final   Parainfluenza Virus 1 NOT DETECTED NOT DETECTED Final   Parainfluenza Virus 2 NOT DETECTED NOT DETECTED  Final   Parainfluenza Virus 3 NOT DETECTED NOT DETECTED Final   Parainfluenza Virus 4 NOT DETECTED NOT DETECTED Final   Respiratory Syncytial Virus NOT DETECTED NOT DETECTED Final   Bordetella pertussis NOT DETECTED NOT DETECTED Final   Bordetella Parapertussis NOT DETECTED NOT DETECTED Final   Chlamydophila pneumoniae NOT DETECTED NOT DETECTED Final   Mycoplasma pneumoniae NOT DETECTED NOT DETECTED Final    Comment: Performed at [facility], [address], [place], [place] [address]  MRSA PCR Screening     Status: None   Collection Time: 01/14/21 11:15 PM   Specimen: Nasopharyngeal  Result Value Ref Range Status   MRSA by PCR NEGATIVE NEGATIVE Final    Comment:        The GeneXpert MRSA Assay (FDA approved for NASAL specimens only), is one component of a comprehensive MRSA colonization surveillance program. It is not intended to diagnose MRSA infection nor to guide or monitor treatment for MRSA infections. Performed at [facility], [place] [address], [place], [place] [address]   Culture, blood (routine x 2)     Status: None   Collection Time: 01/16/21  9:43 AM   Specimen: BLOOD  Result Value Ref Range Status   Specimen Description   Final    BLOOD RIGHT ANTECUBITAL Performed at [place] [address] [place], [place] [address]    Special Requests   Final    BOTTLES DRAWN AEROBIC AND ANAEROBIC Blood Culture adequate volume Performed at [place] [address], [place], [place] [address]    Culture   Final    NO GROWTH 5 DAYS Performed at [facility], [place] [address], [place], [place] [address]    Report Status 01/21/2021 FINAL  Final  Culture, blood (routine x 2)     Status: None   Collection Time: 01/16/21  9:51 AM   Specimen: BLOOD  Result Value Ref Range Status   Specimen Description   Final    BLOOD LEFT ANTECUBITAL Performed at [place] [address],  [place], [place] [address]    Special Requests   Final    BOTTLES DRAWN AEROBIC ONLY Blood Culture adequate volume Performed at [place] [address], [place], [place] [address]    Culture   Final    NO GROWTH 5 DAYS Performed at [facility], [place] [address] [place], [place]  [address]    Report Status 01/21/2021 FINAL  Final     Labs: BNP (last 3 results) Recent Labs    01/11/21 1214 01/17/21 0628  BNP 180.1* 03.4   Basic Metabolic Panel: Recent Labs  Lab 01/17/21 0628 01/18/21 0500 01/20/21 0330 01/21/21 0339 01/22/21 0342  NA 138 138 139 138 138  K 2.9* 3.7 3.1* 3.0* 3.7  CL 112* 112* 112* 109 108  CO2 20* 21* 20* 22 24  GLUCOSE 85 98 100* 129* 106*  BUN _0 CREATININE 0.78 0.42* 0.65 0.80 0.74  CALCIUM 7.6* 7.6* 7.7* 7.9* 8.9  MG  --   --   --  1.9  --    Liver Function Tests: No results for input(s): AST, ALT, ALKPHOS, BILITOT, PROT, ALBUMIN in the last 168 hours. No results for input(s): LIPASE, AMYLASE in the last 168 hours. No results for input(s): AMMONIA in the last 168 hours. CBC: Recent Labs  Lab 01/17/21 0628 01/18/21 0500 01/19/21 0339 01/20/21 0330 01/22/21 0342  WBC 3.1* 2.6* 4.4 5.2 5.0  NEUTROABS  --   --   --   --  3.3  HGB 11.1* 11.5* 10.8* 11.0* 10.7*  HCT 32.7* 34.6* 32.5* 33.2* 32.7*  MCV 89.3 89.2 89.8 89.5 89.8  PLT 124* 128* 133* 169 211   Cardiac Enzymes: No results for input(s): CKTOTAL, CKMB, CKMBINDEX, TROPONINI in the last 168 hours. BNP: Invalid input(s): POCBNP CBG: Recent Labs  Lab 01/15/21 1550  GLUCAP 97   D-Dimer No results for input(s): DDIMER in the last 72 hours. Hgb A1c No results for input(s): HGBA1C in the last 72 hours. Lipid Profile No results for input(s): CHOL, HDL, LDLCALC, TRIG, CHOLHDL, LDLDIRECT in the last 72 hours. Thyroid function studies No results for input(s): TSH, T4TOTAL, T3FREE, THYROIDAB in the last 72 hours.  Invalid input(s): FREET3 Anemia  work up No results for input(s): VITAMINB12, FOLATE, FERRITIN, TIBC, IRON, RETICCTPCT in the last 72 hours. Urinalysis    Component Value Date/Time   COLORURINE YELLOW 01/14/2021 1547   APPEARANCEUR CLEAR 01/14/2021 1547   LABSPEC 1.010 01/14/2021 1547   PHURINE 6.0 01/14/2021 1547   GLUCOSEU NEGATIVE 01/14/2021 1547   HGBUR SMALL (A) 01/14/2021 1547   BILIRUBINUR NEGATIVE 01/14/2021 1547   BILIRUBINUR N/A 03/25/2020 1533   KETONESUR NEGATIVE 01/14/2021 1547   PROTEINUR NEGATIVE 01/14/2021 1547   UROBILINOGEN negative (A) 03/25/2020 1533   NITRITE NEGATIVE 01/14/2021 1547   LEUKOCYTESUR NEGATIVE 01/14/2021 1547   Sepsis Labs Invalid input(s): PROCALCITONIN,  WBC,  LACTICIDVEN Microbiology Recent Results (from the past 240 hour(s))  Clostridium Difficile by PCR     Status: Abnormal   Collection Time: 01/14/21 10:00 AM   Specimen: STOOL   Stool  Result Value Ref Range Status   Toxigenic C. Difficile by PCR Positive (A) Negative Final    Comment: Toxigenic C difficile: Positive Epidemic Strain Bl/NAP1/027: Presumptive Negative   Blood Culture (routine x 2)     Status: Abnormal (Preliminary result)   Collection Time: 01/14/21  3:46 PM   Specimen: BLOOD  Result Value Ref Range Status   Specimen Description   Final    BLOOD LEFT CHEST PORTA CATH Performed at [facility], [address] [place], [place] [address]    Special Requests   Final    BOTTLES DRAWN AEROBIC AND ANAEROBIC Blood Culture adequate volume Performed at [facility], [address], [place], [place] [address]  Culture  Setup Time (A)  Final    GRAM VARIABLE ROD AEROBIC BOTTLE ONLY CRITICAL RESULT CALLED TO, READ BACK BY AND VERIFIED WITH: PHRMD J LEGGE [id number] AT 1059 AM BY CM    Culture (A)  Final    GRAM VARIABLE ROD SENT TO [facility] FOR IDENTIFICATION Sent to [facility] for further susceptibility testing. Performed at [facility], [place] [address] [place], [place]  [address]    Report Status PENDING  Incomplete  Blood Culture ID Panel (Reflexed)     Status: None   Collection Time: 01/14/21  3:46 PM  Result Value Ref Range Status   Enterococcus faecalis NOT DETECTED NOT DETECTED Final   Enterococcus Faecium NOT DETECTED NOT DETECTED Final   Listeria monocytogenes NOT DETECTED NOT DETECTED Final   Staphylococcus species NOT DETECTED NOT DETECTED Final   Staphylococcus aureus (BCID) NOT DETECTED NOT DETECTED Final   Staphylococcus epidermidis NOT DETECTED NOT DETECTED Final   Staphylococcus lugdunensis NOT DETECTED NOT DETECTED Final   Streptococcus species NOT DETECTED NOT DETECTED Final   Streptococcus agalactiae NOT DETECTED NOT DETECTED Final   Streptococcus pneumoniae NOT DETECTED NOT DETECTED Final   Streptococcus pyogenes NOT DETECTED NOT DETECTED Final   A.calcoaceticus-baumannii NOT DETECTED NOT DETECTED Final   Bacteroides fragilis NOT DETECTED NOT DETECTED Final   Enterobacterales NOT DETECTED NOT DETECTED Final   Enterobacter cloacae complex NOT DETECTED NOT DETECTED Final   Escherichia coli NOT DETECTED NOT DETECTED Final   Klebsiella aerogenes NOT DETECTED NOT DETECTED Final   Klebsiella oxytoca NOT DETECTED NOT DETECTED Final   Klebsiella pneumoniae NOT DETECTED NOT DETECTED Final   Proteus species NOT DETECTED NOT DETECTED Final   Salmonella species NOT DETECTED NOT DETECTED Final   Serratia marcescens NOT DETECTED NOT DETECTED Final   Haemophilus influenzae NOT DETECTED NOT DETECTED Final   Neisseria meningitidis NOT DETECTED NOT DETECTED Final   Pseudomonas aeruginosa NOT DETECTED NOT DETECTED Final   Stenotrophomonas maltophilia NOT DETECTED NOT DETECTED Final   Candida albicans NOT DETECTED NOT DETECTED Final   Candida auris NOT DETECTED NOT DETECTED Final   Candida glabrata NOT DETECTED NOT DETECTED Final   Candida krusei NOT DETECTED NOT DETECTED Final   Candida parapsilosis NOT DETECTED NOT DETECTED Final   Candida  tropicalis NOT DETECTED NOT DETECTED Final   Cryptococcus neoformans/gattii NOT DETECTED NOT DETECTED Final    Comment: Performed at [facility], [place]. [address], [place], [place] [address]  Urine culture     Status: Abnormal   Collection Time: 01/14/21  3:47 PM   Specimen: Urine, Catheterized  Result Value Ref Range Status   Specimen Description   Final    IN/OUT CATH URINE Performed at [facility], [address] [place], [place] [address]    Special Requests   Final    NONE Performed at [facility], [address] [place], [place] [address]    Culture >=100,000 COLONIES/mL ENTEROCOCCUS FAECALIS (A)  Final   Report Status 01/17/2021 FINAL  Final   Organism ID, Bacteria ENTEROCOCCUS FAECALIS (A)  Final      Susceptibility   Enterococcus faecalis - MIC*    AMPICILLIN <=2 SENSITIVE Sensitive     NITROFURANTOIN <=16 SENSITIVE Sensitive     VANCOMYCIN 1 SENSITIVE Sensitive     * >=100,000 COLONIES/mL ENTEROCOCCUS FAECALIS  Resp Panel by RT-PCR (Flu A&B, Covid) Nasopharyngeal Swab     Status: None   Collection Time: 01/14/21  3:47  PM   Specimen: Nasopharyngeal Swab; Nasopharyngeal(NP) swabs in vial transport medium  Result Value Ref Range Status   SARS Coronavirus 2 by RT PCR NEGATIVE NEGATIVE Final    Comment: (NOTE) SARS-CoV-2 target nucleic acids are NOT DETECTED.  The SARS-CoV-2 RNA is generally detectable in upper respiratory specimens during the acute phase of infection. The lowest concentration of SARS-CoV-2 viral copies this assay can detect is 138 copies/mL. A negative result does not preclude SARS-Cov-2 infection and should not be used as the sole basis for treatment or other patient management decisions. A negative result may occur with  improper specimen collection/handling, submission of specimen other than nasopharyngeal swab, presence of viral mutation(s) within the areas targeted by this assay, and inadequate number of  viral copies(<138 copies/mL). A negative result must be combined with clinical observations, patient history, and epidemiological information. The expected result is Negative.  Fact Sheet for Patients:  EntrepreneurPulse.com.au  Fact Sheet for Healthcare Providers:  IncredibleEmployment.be  This test is no t yet approved or cleared by the [place] FDA and  has been authorized for detection and/or diagnosis of SARS-CoV-2 by FDA under an Emergency Use Authorization (EUA). This EUA will remain  in effect (meaning this test can be used) for the duration of the COVID-19 declaration under Section 564(b)(1) of the Act, 21 U.S.C.section 360bbb-3(b)(1), unless the authorization is terminated  or revoked sooner.       Influenza A by PCR NEGATIVE NEGATIVE Final   Influenza B by PCR NEGATIVE NEGATIVE Final    Comment: (NOTE) The Xpert Xpress SARS-CoV-2/FLU/RSV plus assay is intended as an aid in the diagnosis of influenza from Nasopharyngeal swab specimens and should not be used as a sole basis for treatment. Nasal washings and aspirates are unacceptable for Xpert Xpress SARS-CoV-2/FLU/RSV testing.  Fact Sheet for Patients: EntrepreneurPulse.com.au  Fact Sheet for Healthcare Providers: IncredibleEmployment.be  This test is not yet approved or cleared by the [place] FDA and has been authorized for detection and/or diagnosis of SARS-CoV-2 by FDA under an Emergency Use Authorization (EUA). This EUA will remain in effect (meaning this test can be used) for the duration of the COVID-19 declaration under Section 564(b)(1) of the Act, 21 U.S.C. section 360bbb-3(b)(1), unless the authorization is terminated or revoked.  Performed at [facility], [address] [place], [place] [address]   Blood Culture (routine x 2)     Status: Abnormal (Preliminary result)   Collection Time: 01/14/21  3:51 PM    Specimen: BLOOD LEFT ARM  Result Value Ref Range Status   Specimen Description   Final    BLOOD LEFT ARM Performed at [facility], [address] [place], [place] [address]    Special Requests   Final    BOTTLES DRAWN AEROBIC AND ANAEROBIC Blood Culture adequate volume Performed at [facility], [address] [place], [place] [address]    Culture  Setup Time (A)  Final    GRAM VARIABLE ROD AEROBIC BOTTLE ONLY CRITICAL VALUE NOTED.  VALUE IS CONSISTENT WITH PREVIOUSLY REPORTED AND CALLED VALUE.    Culture (A)  Final    GRAM VARIABLE ROD SENT TO Launa Grill FOR IDENTIFICATION Performed at [facility], [place] [address] [place], [place] [address]    Report Status PENDING  Incomplete  Susceptibility, Aer + Anaerob     Status: Abnormal   Collection Time: 01/14/21  3:51 PM  Result Value Ref Range Status   Suscept,  Aer + Anaerob Preliminary report (A)  Final    Comment: (NOTE) Performed At: [facility] [place], [place] [id number] Rush Farmer MD FI:[id number]    Source of Sample GRAM NEGATIVE ROD ISOLATED FROM BLOOD CULTURE  Final    Comment: Performed at [facility], [place] [address], [place], [place] [address]  Susceptibility Result     Status: Abnormal   Collection Time: 01/14/21  3:51 PM  Result Value Ref Range Status   Suscept Result 1 Gram negative rods (A)  Corrected    Comment: (NOTE) Performed At: [facility] [address] [place], [place] [id number] Rush Farmer MD AT:[id number] CORRECTED ON 06/08 AT 0740: PREVIOUSLY REPORTED AS Comment   Respiratory (~20 pathogens) panel by PCR     Status: None   Collection Time: 01/14/21  7:22 PM   Specimen: Nasopharyngeal Swab; Respiratory  Result Value Ref Range Status   Adenovirus NOT DETECTED NOT DETECTED Final   Coronavirus 229E NOT DETECTED NOT DETECTED Final    Comment: (NOTE) The Coronavirus on the Respiratory Panel, DOES NOT test for the  novel  Coronavirus (2019 nCoV)    Coronavirus HKU1 NOT DETECTED NOT DETECTED Final   Coronavirus NL63 NOT DETECTED NOT DETECTED Final   Coronavirus OC43 NOT DETECTED NOT DETECTED Final   Metapneumovirus NOT DETECTED NOT DETECTED Final   Rhinovirus / Enterovirus NOT DETECTED NOT DETECTED Final   Influenza A NOT DETECTED NOT DETECTED Final   Influenza B NOT DETECTED NOT DETECTED Final   Parainfluenza Virus 1 NOT DETECTED NOT DETECTED Final   Parainfluenza Virus 2 NOT DETECTED NOT DETECTED Final   Parainfluenza Virus 3 NOT DETECTED NOT DETECTED Final   Parainfluenza Virus 4 NOT DETECTED NOT DETECTED Final   Respiratory Syncytial Virus NOT DETECTED NOT DETECTED Final   Bordetella pertussis NOT DETECTED NOT DETECTED Final   Bordetella Parapertussis NOT DETECTED NOT DETECTED Final   Chlamydophila pneumoniae NOT DETECTED NOT DETECTED Final   Mycoplasma pneumoniae NOT DETECTED NOT DETECTED Final    Comment: Performed at [facility], [address] [place], [place] [address]  MRSA PCR Screening     Status: None   Collection Time: 01/14/21 11:15 PM   Specimen: Nasopharyngeal  Result Value Ref Range Status   MRSA by PCR NEGATIVE NEGATIVE Final    Comment:        The GeneXpert MRSA Assay (FDA approved for NASAL specimens only), is one component of a comprehensive MRSA colonization surveillance program. It is not intended to diagnose MRSA infection nor to guide or monitor treatment for MRSA infections. Performed at [facility], [place] [address], [place], [place] [address]   Culture, blood (routine x 2)     Status: None   Collection Time: 01/16/21  9:43 AM   Specimen: BLOOD  Result Value Ref Range Status   Specimen Description   Final    BLOOD RIGHT ANTECUBITAL Performed at [place] [address], [place], [place] [address]    Special Requests   Final    BOTTLES DRAWN AEROBIC AND ANAEROBIC Blood Culture adequate volume Performed at  [place] [address], [place], [place] [address]    Culture   Final    NO GROWTH 5 DAYS Performed at [facility], [place] [address], [place], [place] [address]    Report Status 01/21/2021 FINAL  Final  Culture, blood (routine x 2)     Status: None  Collection Time: 01/16/21  9:51 AM   Specimen: BLOOD  Result Value Ref Range Status   Specimen Description   Final    BLOOD LEFT ANTECUBITAL Performed at [place] [address], [place], [place] [address]    Special Requests   Final    BOTTLES DRAWN AEROBIC ONLY Blood Culture adequate volume Performed at [place] [address], [place], [place] [address]    Culture   Final    NO GROWTH 5 DAYS Performed at [facility], [place] [address] [place], [place] [address]    Report Status 01/21/2021 FINAL  Final    Please note: You were cared for by a hospitalist during your hospital stay. Once you are discharged, your primary care physician will handle any further medical issues. Please note that NO REFILLS for any discharge medications will be authorized once you are discharged, as it is imperative that you return to your primary care physician (or establish a relationship with a primary care physician if you do not have one) for your post hospital discharge needs so that they can reassess your need for medications and monitor your lab values.    Time coordinating discharge: 40 minutes  SIGNED:   Shelly Coss, MD  [facility] 01/22/2021, 12:28 PM Pager [phone]  If 7PM-7AM, please contact night-coverage [email or web address] Password [email or web address]

## 2021-01-22 NOTE — Plan of Care (Signed)
  Problem: Education: Goal: Knowledge of General Education information will improve Description: Including pain rating scale, medication(s)/side effects and non-pharmacologic comfort measures Outcome: Progressing   Problem: Clinical Measurements: Goal: Will remain free from infection Outcome: Progressing Goal: Diagnostic test results will improve Outcome: Progressing   Problem: Elimination: Goal: Will not experience complications related to bowel motility Outcome: Progressing   Problem: Activity: Goal: Risk for activity intolerance will decrease Outcome: Adequate for Discharge   Problem: Elimination: Goal: Will not experience complications related to urinary retention Outcome: Adequate for Discharge

## 2021-01-22 NOTE — Progress Notes (Signed)
[facility] for Infectious Disease    Date of Admission:  01/14/2021   Total days of antibiotics 8 of cefepime          ID: Megan Brewer is a 75 y.o. female with  GNR bacteremia plus c.difficile Active Problems:   OBSTRUCTIVE SLEEP APNEA   Chronic pain syndrome   Hypogammaglobulinemia (HCC)   Left bundle branch block   Restless legs syndrome   Chronic kidney disease   Hypothyroidism   SIRS (systemic inflammatory response syndrome) (HCC)   Elevated troponin   Hypokalemia   Headache   Acute metabolic encephalopathy    Subjective: Still having numerous watery stools but no cramping or blood  Medications:  . amLODipine  10 mg Oral Daily  . Chlorhexidine Gluconate Cloth  6 each Topical Daily  . enoxaparin (LOVENOX) injection  40 mg Subcutaneous Q24H  . lamoTRIgine  200 mg Oral Daily  . levothyroxine  75 mcg Oral Q0600  . mouth rinse  15 mL Mouth Rinse BID  . mirtazapine  15 mg Oral QHS   Followed by  . [START ON 01/24/2021] mirtazapine  30 mg Oral QHS  . oxybutynin  2.5 mg Oral BID  . pantoprazole  40 mg Oral Daily  . sodium chloride flush  10-40 mL Intracatheter Q12H  . tamsulosin  0.4 mg Oral Daily  . vancomycin  125 mg Oral QID    Objective: Vital signs in last 24 hours: Temp:  [97.6 F (36.4 C)-98 F (36.7 C)] 98 F (36.7 C) (06/08 1343) Pulse Rate:  [79-95] 95 (06/08 1343) Resp:  [17-20] 20 (06/08 1343) BP: (114-152)/(55-82) 114/55 (06/08 1343) SpO2:  [99 %-100 %] 100 % (06/08 1343) Physical Exam  Constitutional:  oriented to person, place, and time. appears well-developed and well-nourished. No distress.  HENT: [place]/AT, PERRLA, no scleral icterus Mouth/Throat: Oropharynx is clear and moist. No oropharyngeal exudate.  Cardiovascular: Normal rate, regular rhythm and normal heart sounds. Exam reveals no gallop and no friction rub.  No murmur heard.  Pulmonary/Chest: Effort normal and breath sounds normal. No respiratory distress.  has no wheezes.   Neck = supple, no nuchal rigidity Abdominal: Soft. Bowel sounds are normal.  exhibits no distension. There is no tenderness.  Lymphadenopathy: no cervical adenopathy. No axillary adenopathy Neurological: alert and oriented to person, place, and time.  Skin: Skin is warm and dry. No rash noted. No erythema.  Psychiatric: a normal mood and affect.  behavior is normal.   Lab Results Recent Labs    01/20/21 0330 01/21/21 0339 01/22/21 0342  WBC 5.2  --  5.0  HGB 11.0*  --  10.7*  HCT 33.2*  --  32.7*  NA 139 138 138  K 3.1* 3.0* 3.7  CL 112* 109 108  CO2 20* 22 24  BUN 9 14 16   CREATININE 0.65 0.80 0.74    Microbiology: 5/31 GNR bacteremia ( identification still pending) Studies/Results: No results found.   Assessment/Plan: Gram negative bacteremia = currently on cefepime, day 8 of 14. We should hear from [facility] about identification and susceptibilities in 3 days. At that time we will call patient to see how she is doing to decide if can stop early vs finishing course of cefepime x 2 wk  cdifficile diarrhea = plan to continue QID dosing through 10 more days beyond completion of cefepime.  Anorexia = continue to encourage good oral intake   We will see back in the ID clinic in 10 d  Caren Griffins  [facility] Cell: [phone] Pager: [phone]  01/22/2021, 6:55 PM

## 2021-01-23 ENCOUNTER — Telehealth: Payer: Self-pay | Admitting: *Deleted

## 2021-01-23 ENCOUNTER — Ambulatory Visit: Payer: Medicare PPO | Admitting: Neurology

## 2021-01-23 NOTE — Telephone Encounter (Signed)
Transition Care Management Follow-Up Telephone Call   Date discharged and where:01/22/2021 [facility]  How have you been since you were released from the hospital? Getting Better  Any patient concerns? No  Items Reviewed:   Meds: Yes  Allergies:Yes  Dietary Changes Reviewed:Yes  Functional Questionnaire:  Independent-I Dependent-D  ADLs:I with assistance. Home Health   Dressing- I with assitance    Eating-I   Maintaining continence-I   Transferring-I with assistance, husband and Walker   Transportation-D   Meal Prep-D   Managing Meds- I with assistance from husband  Confirmed importance and Date/Time of follow-up visits scheduled:Appointment with Dr. Sabra Heck 01/28/2021   Confirmed with patient if condition worsens to call PCP or go to the Emergency Dept. Patient was given office number and encouraged to call back with questions or concerns:  Yes

## 2021-01-24 ENCOUNTER — Inpatient Hospital Stay: Payer: Medicare PPO | Admitting: Infectious Diseases

## 2021-01-25 ENCOUNTER — Other Ambulatory Visit: Payer: Self-pay | Admitting: Nurse Practitioner

## 2021-01-25 DIAGNOSIS — R6520 Severe sepsis without septic shock: Secondary | ICD-10-CM | POA: Diagnosis not present

## 2021-01-25 DIAGNOSIS — M79604 Pain in right leg: Secondary | ICD-10-CM

## 2021-01-25 DIAGNOSIS — A0472 Enterocolitis due to Clostridium difficile, not specified as recurrent: Secondary | ICD-10-CM | POA: Diagnosis not present

## 2021-01-25 DIAGNOSIS — N390 Urinary tract infection, site not specified: Secondary | ICD-10-CM

## 2021-01-25 DIAGNOSIS — M79605 Pain in left leg: Secondary | ICD-10-CM

## 2021-01-25 DIAGNOSIS — G9341 Metabolic encephalopathy: Secondary | ICD-10-CM | POA: Diagnosis not present

## 2021-01-25 DIAGNOSIS — D801 Nonfamilial hypogammaglobulinemia: Secondary | ICD-10-CM

## 2021-01-25 DIAGNOSIS — B952 Enterococcus as the cause of diseases classified elsewhere: Secondary | ICD-10-CM

## 2021-01-25 DIAGNOSIS — I21A1 Myocardial infarction type 2: Secondary | ICD-10-CM

## 2021-01-25 DIAGNOSIS — A419 Sepsis, unspecified organism: Secondary | ICD-10-CM | POA: Diagnosis not present

## 2021-01-28 ENCOUNTER — Encounter: Payer: Self-pay | Admitting: Family Medicine

## 2021-01-28 ENCOUNTER — Ambulatory Visit: Payer: Medicare PPO | Admitting: Family Medicine

## 2021-01-28 ENCOUNTER — Other Ambulatory Visit: Payer: Self-pay

## 2021-01-28 VITALS — BP 118/68 | HR 80 | Temp 97.5°F

## 2021-01-28 DIAGNOSIS — D803 Selective deficiency of immunoglobulin G [IgG] subclasses: Secondary | ICD-10-CM

## 2021-01-28 DIAGNOSIS — R6510 Systemic inflammatory response syndrome (SIRS) of non-infectious origin without acute organ dysfunction: Secondary | ICD-10-CM | POA: Diagnosis not present

## 2021-01-28 NOTE — Progress Notes (Signed)
Provider:  Alain Honey, MD  Careteam: Patient Care Team: [name], NP as PCP - General (Geriatric Medicine) Alda Berthold, DO as Consulting Physician (Neurology)  PLACE OF SERVICE:  [facility]  Advanced Directive information    Allergies  Allergen Reactions   Tetracycline Nausea Only    Causes ringing in ears, dizziness, migraine, nausea   Corticosteroids Other (See Comments)    12/02/2016 interview by Melissa Montane: Oral dosing causes migraines/nausea/tinnitus. Prev tolerated IV and intranasal admin w/o difficulty.   Cefixime Other (See Comments)    Headache    Ciprofloxacin Other (See Comments)    Headache, dizziness, ringing in the ears   Doxycycline Other (See Comments)    unknown   Gabapentin     Throat swells/closes   Isometheptene-Dichloral-Apap Other (See Comments)    unknown   Ketorolac     12/02/2016 interview by KXF: Oral dosing prednisone/corticosteroids causes migraines/nausea/tinnitus. Prev tolerated IV and intranasal admin w/o difficulty.   Prednisone Other (See Comments)    Migraine, dizzy, ringing in ears-can take it IV 12/02/2016 interview by [name]: Oral prednisone dosing causes migraines/nausea/tinnitus. Prev tolerated IV and intranasal admin w/o difficulty.   Promethazine Other (See Comments)    causes severe abdominal pain when taken IV; however she can take PO or IM   Stadol [Butorphanol]     Cerebral pain   Erythromycin Base Diarrhea and Itching    Severe diarrhea   Propoxyphene Nausea Only    Chief Complaint  Patient presents with   Hospitalization Follow-up    Patient presents today for hospital follow-up. She was at [facility] from 01/14/21- 01/22/21. Patient reports infection in the blood and Cdiff reason for hospitalization.     HPI: Patient is a 75 y.o. female recently hospitalized for sepsis and C. difficile colitis.  She continues on IV cefepime as well as oral vancomycin.  She continues to have diarrhea.  There is also  a history of remote C. difficile infection in the past she was hospitalized 01/14/2021 to 01/22/2021.  She has follow-up appointments pending with infectious disease as well as gastroenterology which I encouraged her to keep.  Regarding the C. difficile.  She is on probiotics as well as drugs for acid which may not be appropriate given her C. difficile.  Review of Systems:  Review of Systems  Constitutional:  Positive for malaise/fatigue and weight loss. Negative for fever.  HENT: Negative.    Respiratory: Negative.    Cardiovascular: Negative.   Gastrointestinal:  Positive for abdominal pain and diarrhea.  Genitourinary: Negative.   Skin: Negative.   Neurological: Negative.   Psychiatric/Behavioral: Negative.    All other systems reviewed and are negative.  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[place])    Lung cancer ([place]) 2011, 2014   Memory loss    Migraine    Opioid abuse ([place])    Osteoporosis    Presence of neurostimulator    Restless legs    Sleep apnea    Past Surgical History:  Procedure Laterality Date   ABDOMINAL HYSTERECTOMY     CARPAL TUNNEL RELEASE     CATARACT EXTRACTION Bilateral 2019  CHOLECYSTECTOMY     COLONOSCOPY  2015   [facility]   CT LUNG SCREENING  2018   DG  BONE DENSITY ([place] HX)  2018   DIAGNOSTIC MAMMOGRAM  2019   GASTRIC BYPASS     LUNG CANCER SURGERY  02/2010   MULTIPLE TOOTH EXTRACTIONS     SPINAL CORD STIMULATOR IMPLANT     Social History:   reports that she has never smoked. She has never used smokeless tobacco. She reports previous alcohol use. She reports that she does not use drugs.  Family History  Problem Relation Age of Onset   Hypertension Mother    GER  disease Mother    Dementia Mother    Osteoporosis Mother    Arthritis Mother    Bipolar disorder Mother    Parkinson's disease Father    Congestive Heart Failure Father    Pneumonia Father    Arthritis Father    Dementia Father    Depression Father    Asthma Sister    Depression Sister    Bipolar disorder Brother    Asthma Daughter    Colon cancer Neg Hx    Esophageal cancer Neg Hx    Stomach cancer Neg Hx    Rectal cancer Neg Hx     Medications: Patient's Medications  New Prescriptions   No medications on file  Previous Medications   ACETAMINOPHEN (TYLENOL) 500 MG TABLET    Take 1-2 tablets (500-1,000 mg total) by mouth every 8 (eight) hours as needed (Max dose 3000 mg in 24 hours).   AMLODIPINE (NORVASC) 10 MG TABLET    Take 1 tablet (10 mg total) by mouth daily.   AZELASTINE-FLUTICASONE 137-50 MCG/ACT SUSP    ONE SPRAY EACH NOSTRIL TWICE A DAY AS NEEDED   BACLOFEN (LIORESAL) 10 MG TABLET    Take 10 mg by mouth daily as needed for muscle spasms.   BUPRENORPHINE HCL-NALOXONE HCL 4-1 MG FILM    Place 1 strip under the tongue 2 (two) times daily.   CALCIUM CITRATE 200 MG TABS    Take 1 tablet by mouth daily.   CEFEPIME (MAXIPIME) IVPB    Inject 2 g into the vein every 12 (twelve) hours for 9 days. Indication:  Bacteremia First Dose: No Last Day of Therapy:  01/29/2021 Labs - Once weekly:  CBC/D and BMP, Labs - Every other week:  ESR and CRP Method of administration: IV Push Method of administration may be changed at the discretion of home infusion pharmacist based upon assessment of the patient and/or caregiver's ability to self-administer the medication ordered.   CHOLECALCIFEROL (VITAMIN D3) 25 MCG (1000 UNIT) TABLET    Take 1,000 Units by mouth daily.   CYCLOSPORINE (RESTASIS) 0.05 % OPHTHALMIC EMULSION    Place 1 drop into both eyes 2 (two) times daily.   DEXLANSOPRAZOLE (DEXILANT) 60 MG CAPSULE    Take 1 capsule (60 mg total) by mouth daily. Crack open the capsule and pour  into spoon prior to ingestion. PLEASE SCHEDULE AN OFFICE VISIT FOR FURTHER REFILLS. Thank you   ERENUMAB-AOOE (AIMOVIG, 140 MG DOSE,) 70 MG/ML SOAJ    Inject into the skin every 30 (thirty) days.   ESTRADIOL (ESTRACE) 0.1 MG/GM VAGINAL CREAM    Place 1 Applicatorful vaginally 3 (three) times a week.   FERROUS SULFATE 325 (65 FE) MG TABLET    Take 650 mg by mouth daily with breakfast.   HYDROXYZINE (VISTARIL) 25 MG CAPSULE    TAKE ONE CAPSULE BY MOUTH  TWICE DAILY AS NEEDED   IBUPROFEN (ADVIL) 200 MG TABLET    Take 200 mg by mouth every 6 (six) hours as needed for mild pain.   IMMUNE GLOBULIN, HUMAN, (GAMMAGARD S/D LESS IGA) 10 G INJECTION    Inject into the vein every 21 ( twenty-one) days.    LAMOTRIGINE (LAMICTAL) 200 MG TABLET    TAKE 1 TABLET BY MOUTH DAILY   LASTACAFT 0.25 % SOLN    Apply 1 drop to eye daily.   LEVOTHYROXINE (SYNTHROID) 75 MCG TABLET    Take one tablet by mouth once daily 30 minutes before breakfast on empty stomach.   MAGNESIUM OXIDE (MAG-OX) 400 MG TABLET    Take 400 mg by mouth daily.    MIRTAZAPINE (REMERON) 30 MG TABLET    Take 1/2 tablet at bedtime for one week, then increase to 1 tablet at bedtime   OXYBUTYNIN (DITROPAN) 5 MG/5ML SYRUP    Take 2.5 mLs (2.5 mg total) by mouth 2 (two) times daily.   POTASSIUM CHLORIDE SA (KLOR-CON) 20 MEQ TABLET    TAKE TWO TABLETS BY MOUTH DAILY (MAY DISSOLVE IN WATER)   ROPINIROLE (REQUIP) 1 MG TABLET    TAKE 1 TABLET BY MOUTH AT BEDTIME   SACCHAROMYCES BOULARDII (FLORASTOR) 250 MG CAPSULE    Take 1 capsule (250 mg total) by mouth 2 (two) times daily.   SUCRALFATE (CARAFATE) 1 GM/10ML SUSPENSION    Take 2 tsp by mouth three times daily as needed.   SUMATRIPTAN (IMITREX) 50 MG TABLET    Take 1 tablet (50 mg total) by mouth every 2 (two) hours as needed for migraine or headache. May repeat in 2 hours if headache persists or recurs.   TAMSULOSIN (FLOMAX) 0.4 MG CAPS CAPSULE    Take 1 capsule (0.4 mg total) by mouth daily.   TIZANIDINE  (ZANAFLEX) 4 MG TABLET    Take 1 tablet (4 mg total) by mouth every 6 (six) hours as needed for muscle spasms.   TORSEMIDE (DEMADEX) 20 MG TABLET    TAKE 1 TABLET BY MOUTH DAILY   TRIAMCINOLONE CREAM (KENALOG) 0.1 %    Apply 1 application topically 2 (two) times daily as needed (rash).   UNABLE TO FIND    Med Name: [name] 1 by mouth daily for UTI prevention   VANCOMYCIN (VANCOCIN) 125 MG CAPSULE    Take 1 capsule (125 mg total) by mouth 4 (four) times daily for 17 days.   ZINC OXIDE, TOPICAL, 10 % CREA    Apply 1 application topically in the morning and at bedtime.  Modified Medications   No medications on file  Discontinued Medications   No medications on file    Physical Exam:  Vitals:   01/28/21 1414  BP: 118/68  Pulse: 80  Temp: (!) 97.5 F (36.4 C)  TempSrc: Temporal  SpO2: 98%   There is no height or weight on file to calculate BMI. Wt Readings from Last 3 Encounters:  01/14/21 115 lb (52.2 kg)  01/11/21 123 lb (55.8 kg)  01/02/21 117 lb 12.8 oz (53.4 kg)    Physical Exam Vitals and nursing note reviewed.  Constitutional:      Appearance: She is ill-appearing.  HENT:     Head: Normocephalic.  Cardiovascular:     Rate and Rhythm: Normal rate and regular rhythm.  Pulmonary:     Effort: Pulmonary effort is normal.     Breath sounds: Normal breath sounds.  Abdominal:     General: Abdomen  is flat. Bowel sounds are normal.     Tenderness: There is abdominal tenderness.  Neurological:     General: No focal deficit present.     Mental Status: She is alert and oriented to person, place, and time.  Psychiatric:        Mood and Affect: Mood normal.        Behavior: Behavior normal.    Labs reviewed: Basic Metabolic Panel: Recent Labs    06/10/20 1545 06/10/20 1549 01/14/21 1547 01/14/21 2158 01/15/21 0030 01/15/21 0500 01/16/21 0538 01/20/21 0330 01/21/21 0339 01/22/21 0342  NA  --    < > 135   < >  --  135   < > 139 138 138  K  --    < > 2.8*   < >  --   4.0   < > 3.1* 3.0* 3.7  CL  --    < > 101   < >  --  106   < > 112* 109 108  CO2  --    < > 23   < >  --  25   < > 20* 22 24  GLUCOSE  --    < > 136*   < >  --  121*   < > 100* 129* 106*  BUN  --    < > 23   < >  --  15   < > _0 CREATININE  --    < > 0.81   < >  --  0.65   < > 0.65 0.80 0.74  CALCIUM  --    < > 8.9   < >  --  8.1*   < > 7.7* 7.9* 8.9  MG  --    < > 1.4*  --   --  3.0*  --   --  1.9  --   PHOS  --   --   --   --  3.2 2.8  --   --   --   --   TSH 5.05*  --   --   --   --  2.990  --   --   --   --    < > = values in this interval not displayed.   Liver Function Tests: Recent Labs    01/11/21 1214 01/14/21 1547 01/15/21 0500  AST 39 33 33  ALT 36 29 29  ALKPHOS 64 72 56  BILITOT 0.5 0.4 0.4  PROT 8.0 7.2 6.5  ALBUMIN 3.6 3.4* 2.9*   Recent Labs    08/28/20 1426 12/09/20 1138  LIPASE 46 32   No results for input(s): AMMONIA in the last 8760 hours. CBC: Recent Labs    01/14/21 1547 01/15/21 0500 01/16/21 0538 01/19/21 0339 01/20/21 0330 01/22/21 0342  WBC 6.0 6.4   < > 4.4 5.2 5.0  NEUTROABS 5.6 6.0  --   --   --  3.3  HGB 13.6 12.9   < > 10.8* 11.0* 10.7*  HCT 40.4 38.3   < > 32.5* 33.2* 32.7*  MCV 88.6 91.0   < > 89.8 89.5 89.8  PLT 120* 105*   < > 133* 169 211   < > = values in this interval not displayed.   Lipid Panel: No results for input(s): CHOL, HDL, LDLCALC, TRIG, CHOLHDL, LDLDIRECT in the last 8760 hours. TSH: Recent Labs    06/10/20 1545 01/15/21 0500  TSH 5.05* 2.990  A1C: No results found for: HGBA1C   Assessment/Plan  1. 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 SIRS (systemic inflammatory response syndrome) (HCC) Recent hospitalization with sepsis and C. difficile colitis.  Continuing with antibiotics for both.  Has follow-up appointments with infectious disease and GI    Alain Honey, MD [place] [phone]

## 2021-01-29 ENCOUNTER — Other Ambulatory Visit (INDEPENDENT_AMBULATORY_CARE_PROVIDER_SITE_OTHER): Payer: Medicare PPO

## 2021-01-29 ENCOUNTER — Telehealth: Payer: Self-pay | Admitting: Nurse Practitioner

## 2021-01-29 ENCOUNTER — Ambulatory Visit (INDEPENDENT_AMBULATORY_CARE_PROVIDER_SITE_OTHER): Payer: Medicare PPO | Admitting: Physician Assistant

## 2021-01-29 ENCOUNTER — Encounter: Payer: Self-pay | Admitting: Physician Assistant

## 2021-01-29 VITALS — BP 110/58 | HR 80 | Ht 62.0 in | Wt 119.0 lb

## 2021-01-29 DIAGNOSIS — A0472 Enterocolitis due to Clostridium difficile, not specified as recurrent: Secondary | ICD-10-CM

## 2021-01-29 DIAGNOSIS — R197 Diarrhea, unspecified: Secondary | ICD-10-CM

## 2021-01-29 LAB — CBC WITH DIFFERENTIAL/PLATELET
Basophils Absolute: 0.2 10*3/uL — ABNORMAL HIGH (ref 0.0–0.1)
Basophils Relative: 2.4 % (ref 0.0–3.0)
Eosinophils Absolute: 0.1 10*3/uL (ref 0.0–0.7)
Eosinophils Relative: 0.9 % (ref 0.0–5.0)
HCT: 38.3 % (ref 36.0–46.0)
Hemoglobin: 12.8 g/dL (ref 12.0–15.0)
Lymphocytes Relative: 21.2 % (ref 12.0–46.0)
Lymphs Abs: 1.9 10*3/uL (ref 0.7–4.0)
MCHC: 33.6 g/dL (ref 30.0–36.0)
MCV: 88.7 fl (ref 78.0–100.0)
Monocytes Absolute: 0.8 10*3/uL (ref 0.1–1.0)
Monocytes Relative: 8.6 % (ref 3.0–12.0)
Neutro Abs: 6 10*3/uL (ref 1.4–7.7)
Neutrophils Relative %: 66.9 % (ref 43.0–77.0)
Platelets: 304 10*3/uL (ref 150.0–400.0)
RBC: 4.31 Mil/uL (ref 3.87–5.11)
RDW: 14.6 % (ref 11.5–15.5)
WBC: 9 10*3/uL (ref 4.0–10.5)

## 2021-01-29 LAB — BASIC METABOLIC PANEL
BUN: 37 mg/dL — ABNORMAL HIGH (ref 6–23)
CO2: 25 mEq/L (ref 19–32)
Calcium: 10.3 mg/dL (ref 8.4–10.5)
Chloride: 101 mEq/L (ref 96–112)
Creatinine, Ser: 1.12 mg/dL (ref 0.40–1.20)
GFR: 49.11 mL/min — ABNORMAL LOW (ref >60.00)
Glucose, Bld: 99 mg/dL (ref 70–99)
Potassium: 4.3 mEq/L (ref 3.5–5.1)
Sodium: 136 mEq/L (ref 135–145)

## 2021-01-29 MED ORDER — VANCOMYCIN HCL 125 MG PO CAPS: 125.0000 mg | ORAL_CAPSULE | Freq: Four times a day (QID) | ORAL | 0 refills | Status: DC

## 2021-01-29 NOTE — Progress Notes (Signed)
Subjective:    Patient ID: Megan Brewer, female    DOB: 11/04/1945, 75 y.o.   MRN: [id number]  HPI Megan Brewer is a pleasant 75 year old white female, known to Dr. Havery Moros who comes in today for post hospital follow-up. Patient has history of sleep apnea, restless leg syndrome, depression, lung cancer for which she has undergone prior resection and is status post Roux-en-Y gastric bypass.  She has an IgG deficiency. She was admitted to the hospital 5/31 through 01/22/2021 with SIRS/sepsis associated with acute metabolic encephalopathy.  She had just been diagnosed with C. difficile on 01/14/2021.  She was started on vancomycin at the time of admission.  She was not seen by GI during her mission but was seen by infectious disease as she also had positive blood culture showing a gram variable rod not exactly identified and was treated with cefepime.  She was discharged to home to complete 7 more days of cefepime which she will finish today.  Repeat blood cultures no growth.  Patient has previous remote history of C. difficile. She says that interestingly she had diarrhea start around Thanksgiving 2021 which persisted through early January and then resolved.  She was tested for C. difficile at that time and was negative.  She says her current diarrhea started around Easter and has been persistent and then gradually worsened. Since she has been discharged from the hospital she says she continues to feel very fatigued in general.  She has not had any fever or chills, she is able to eat without nausea or vomiting.  She has no complaints of abdominal pain or cramping.  She is continuing to have persistent diarrhea though she says the frequent episodes with tenesmus are usually small volume squirts of diarrhea and she may have 1 or 2 episodes of diarrhea during the day which are larger volume.  She is not seeing any melena or hematochezia. She is also been on Florastor. Again today is her last day of  cefepime and she has follow-up with infectious disease next week.  Last colonoscopy was done in May 2021 with a removal of a 4 mm polyp from the transverse colon, noted to have a hypertrophied anal papillae.  Biopsy of the polyp was consistent with a tubular adenoma and the anal papillae path returned positive for AIN and she was subsequently sent to surgery for management.  EGD May 2021 with finding of grade B esophagitis, and Roux-en-Y gastrojejunostomy.  Review of Systems Pertinent positive and negative review of systems were noted in the above HPI section.  All other review of systems was otherwise negative.   Outpatient Encounter Medications as of 01/29/2021  Medication Sig   acetaminophen (TYLENOL) 500 MG tablet Take 1-2 tablets (500-1,000 mg total) by mouth every 8 (eight) hours as needed (Max dose 3000 mg in 24 hours).   amLODipine (NORVASC) 10 MG tablet Take 1 tablet (10 mg total) by mouth daily.   Azelastine-Fluticasone 137-50 MCG/ACT SUSP ONE SPRAY EACH NOSTRIL TWICE A DAY AS NEEDED (Patient taking differently: Place 1 spray into both nostrils 2 (two) times daily as needed (allergies). ONE SPRAY EACH NOSTRIL TWICE A DAY AS NEEDED)   baclofen (LIORESAL) 10 MG tablet Take 10 mg by mouth daily as needed for muscle spasms.   Calcium Citrate 200 MG TABS Take 1 tablet by mouth daily.   ceFEPime (MAXIPIME) IVPB Inject 2 g into the vein every 12 (twelve) hours for 9 days. Indication:  Bacteremia First Dose: No Last Day of Therapy:  01/29/2021 Labs - Once weekly:  CBC/D and BMP, Labs - Every other week:  ESR and CRP Method of administration: IV Push Method of administration may be changed at the discretion of home infusion pharmacist based upon assessment of the patient and/or caregiver's ability to self-administer the medication ordered.   cholecalciferol (VITAMIN D3) 25 MCG (1000 UNIT) tablet Take 1,000 Units by mouth daily.   cycloSPORINE (RESTASIS) 0.05 % ophthalmic emulsion Place 1 drop  into both eyes 2 (two) times daily.   dexlansoprazole (DEXILANT) 60 MG capsule Take 1 capsule (60 mg total) by mouth daily. Crack open the capsule and pour into spoon prior to ingestion. PLEASE SCHEDULE AN OFFICE VISIT FOR FURTHER REFILLS. Thank you   Erenumab-aooe (AIMOVIG, 140 MG DOSE,) 70 MG/ML SOAJ Inject into the skin every 30 (thirty) days.   estradiol (ESTRACE) 0.1 MG/GM vaginal cream Place 1 Applicatorful vaginally 3 (three) times a week.   ferrous sulfate 325 (65 FE) MG tablet Take 650 mg by mouth daily with breakfast.   hydrOXYzine (VISTARIL) 25 MG capsule TAKE ONE CAPSULE BY MOUTH TWICE DAILY AS NEEDED (Patient taking differently: Take 25 mg by mouth 2 (two) times daily as needed for anxiety.)   ibuprofen (ADVIL) 200 MG tablet Take 200 mg by mouth every 6 (six) hours as needed for mild pain.   immune globulin, human, (GAMMAGARD S/D LESS IGA) 10 g injection Inject into the vein every 21 ( twenty-one) days.    lamoTRIgine (LAMICTAL) 200 MG tablet TAKE 1 TABLET BY MOUTH DAILY (Patient taking differently: Take 200 mg by mouth at bedtime.)   LASTACAFT 0.25 % SOLN Apply 1 drop to eye daily.   levothyroxine (SYNTHROID) 75 MCG tablet Take one tablet by mouth once daily 30 minutes before breakfast on empty stomach.   magnesium oxide (MAG-OX) 400 MG tablet Take 400 mg by mouth daily.    mirtazapine (REMERON) 30 MG tablet Take 1/2 tablet at bedtime for one week, then increase to 1 tablet at bedtime   oxybutynin (DITROPAN) 5 MG/5ML syrup Take 2.5 mLs (2.5 mg total) by mouth 2 (two) times daily.   potassium chloride SA (KLOR-CON) 20 MEQ tablet TAKE TWO TABLETS BY MOUTH DAILY (MAY DISSOLVE IN WATER)   rOPINIRole (REQUIP) 1 MG tablet TAKE 1 TABLET BY MOUTH AT BEDTIME   saccharomyces boulardii (FLORASTOR) 250 MG capsule Take 1 capsule (250 mg total) by mouth 2 (two) times daily.   sucralfate (CARAFATE) 1 GM/10ML suspension Take 2 tsp by mouth three times daily as needed. (Patient taking differently: Take  2 g by mouth 3 (three) times daily as needed (gerd).)   tamsulosin (FLOMAX) 0.4 MG CAPS capsule Take 1 capsule (0.4 mg total) by mouth daily.   tiZANidine (ZANAFLEX) 4 MG tablet Take 1 tablet (4 mg total) by mouth every 6 (six) hours as needed for muscle spasms.   torsemide (DEMADEX) 20 MG tablet TAKE 1 TABLET BY MOUTH DAILY   triamcinolone cream (KENALOG) 0.1 % Apply 1 application topically 2 (two) times daily as needed (rash).   UNABLE TO FIND Med Name: [name] 1 by mouth daily for UTI prevention   ZINC OXIDE, TOPICAL, 10 % CREA Apply 1 application topically in the morning and at bedtime. (Patient taking differently: Apply 1 application topically 2 (two) times daily as needed (rash).)   [DISCONTINUED] vancomycin (VANCOCIN) 125 MG capsule Take 1 capsule (125 mg total) by mouth 4 (four) times daily for 17 days.   vancomycin (VANCOCIN) 125 MG capsule Take 1 capsule (125  mg total) by mouth 4 (four) times daily for 7 days.   [DISCONTINUED] Buprenorphine HCl-Naloxone HCl 4-1 MG FILM Place 1 strip under the tongue 2 (two) times daily. (Patient not taking: Reported on 01/28/2021)   [DISCONTINUED] SUMAtriptan (IMITREX) 50 MG tablet Take 1 tablet (50 mg total) by mouth every 2 (two) hours as needed for migraine or headache. May repeat in 2 hours if headache persists or recurs. (Patient not taking: Reported on 01/28/2021)   No facility-administered encounter medications on file as of 01/29/2021.   Allergies  Allergen Reactions   Imitrex [Sumatriptan] Nausea And Vomiting    Ringing in the ears, migraines are worse   Tetracycline Nausea Only    Causes ringing in ears, dizziness, migraine, nausea   Corticosteroids Other (See Comments)    12/02/2016 interview by [facility]: Oral dosing causes migraines/nausea/tinnitus. Prev tolerated IV and intranasal admin w/o difficulty.   Cefixime Other (See Comments)    Headache    Ciprofloxacin Other (See Comments)    Headache, dizziness, ringing in the ears   Doxycycline  Other (See Comments)    unknown   Gabapentin     Throat swells/closes   Isometheptene-Dichloral-Apap Other (See Comments)    unknown   Ketorolac     12/02/2016 interview by SEG: Oral dosing prednisone/corticosteroids causes migraines/nausea/tinnitus. Prev tolerated IV and intranasal admin w/o difficulty.   Prednisone Other (See Comments)    Migraine, dizzy, ringing in ears-can take it IV 12/02/2016 interview by [name]: Oral prednisone dosing causes migraines/nausea/tinnitus. Prev tolerated IV and intranasal admin w/o difficulty.   Promethazine Other (See Comments)    causes severe abdominal pain when taken IV; however she can take PO or IM   Stadol [Butorphanol]     Cerebral pain   Erythromycin Base Diarrhea and Itching    Severe diarrhea   Propoxyphene Nausea Only   Patient Active Problem List   Diagnosis Date Noted   Acute metabolic encephalopathy 31/51/7616   SIRS (systemic inflammatory response syndrome) (HCC) 01/14/2021   Elevated troponin 01/14/2021   Hypokalemia 01/14/2021   Headache 01/14/2021   Chest discomfort 03/22/2019   Weight loss 01/27/2019   Acquired hypothyroidism 01/27/2019   Esophagitis 01/27/2019   Intractable migraine without status migrainosus 01/27/2019   Osteoporosis 01/27/2019   Chronic sinusitis 12/28/2018   Food intolerance/GI symptoms 12/28/2018   Acute maxillary sinusitis 12/28/2018   Lower leg edema 12/20/2018   Chronic pain of both shoulders 05/24/2018   Bilateral leg edema 05/24/2018   Chronic pain syndrome 03/31/2018   Left bundle branch block 10/28/2017   Palpitations 10/28/2017   History of gastric bypass 05/21/2017   Polypharmacy 02/11/2017   Senile nuclear sclerosis 11/26/2016   Chronic post-thoracotomy pain 04/09/2014   Chronic kidney disease 04/09/2014   Sleep disturbances 09/20/2012   Restless legs syndrome 07/07/2012   Hypertonicity of bladder 05/23/2012   Essential tremor 04/01/2012   Memory loss 04/01/2012   Incomplete  emptying of bladder 01/14/2012   Urge incontinence 10/28/2011   Tension type headache 06/10/2011   Mixed urge and stress incontinence 05/28/2011   Hypogammaglobulinemia ([place]) 06/22/2010   Severe episode of recurrent major depressive disorder, without psychotic features ([place]) 06/22/2010   Hypothyroidism 02/12/2010   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[place]) 01/31/2010   ARACHNOIDITIS 01/31/2010   OBSTRUCTIVE SLEEP APNEA 01/31/2010   Chronic rhinitis 01/31/2010   Arachnoiditis 01/31/2010   Social History   Socioeconomic History   Marital status: Married    Spouse name: Not on file   Number of children: 2  Years of education: Not on file   Highest education level: GED or equivalent  Occupational History   Occupation: retired  Tobacco Use   Smoking status: Never   Smokeless tobacco: Never  Vaping Use   Vaping Use: Never used  Substance and Sexual Activity   Alcohol use: Not Currently   Drug use: Never   Sexual activity: Not on file  Other Topics Concern   Not on file  Social History Narrative   Diet: None      Caffeine: coffee, tea, sodas less than or 1 daily.      Married, if yes what year: Yes, 1972      Do you live in a house, apartment, assisted living, condo, trailer, ect: Two story house       Pets: None      Current/Past profession: Teach      Exercise: None         Living Will: No   DNR: No   POA/HPOA: No      Functional Status:   Do you have difficulty bathing or dressing yourself? yes   Do you have difficulty preparing food or eating? yes   Do you have difficulty managing your medications? yes   Do you have difficulty managing your finances?yes   Do you have difficulty affording your medications? no   Social Determinants of Health   Financial Resource Strain: Not on file  Food Insecurity: Not on file  Transportation Needs: Not on file  Physical Activity: Not on file  Stress: Not on file  Social Connections: Not on file  Intimate Partner Violence:  Not on file    Ms. Tanksley's family history includes Arthritis in her father and mother; Asthma in her daughter and sister; Bipolar disorder in her brother and mother; Congestive Heart Failure in her father; Dementia in her father and mother; Depression in her father and sister; GER disease in her mother; Hypertension in her mother; Osteoporosis in her mother; Parkinson's disease in her father; Pneumonia in her father.      Objective:    Vitals:   01/29/21 1049  BP: (!) 110/58  Pulse: 80    Physical Exam Well-developed frail-appearing older white female in no acute distress.  Ambulating with a walker Weight, 119 BMI 21.7  HEENT; nontraumatic normocephalic, EOMI, PE R LA, sclera anicteric. Oropharynx; not examined today Neck; supple, no JVD Cardiovascular; regular rate and rhythm with S1-S2, no murmur rub or gallop Pulmonary; Clear bilaterally, IV in left chest wall Abdomen; soft, nontender, nondistended, no palpable mass or hepatosplenomegaly, bowel sounds are active/hyperactive Rectal; not done today Skin; benign exam, no jaundice rash or appreciable lesions Extremities; no clubbing cyanosis or edema skin warm and dry Neuro/Psych; alert and oriented x4, grossly nonfocal mood and affect appropriate        Assessment & Plan:   #36 75 year old white female seen today in post hospital follow-up after recent admission with SIRS/sepsis with positive blood culture for an unknown specified variable gram-negative rod for which she is completing a course of cefepime today IV.  She was also diagnosed with C. difficile on the day of admission and has been having persistent diarrhea over the past 2 months. She is currently on a 14-day course of vancomycin.  She has improved but is continuing to have diarrhea, several bowel movements per day though most of these are very small volume episodes with tenesmus and having 1 or 2 episodes of large-volume stool.  She has no current complaints of  abdominal pain, no fever and no bleeding.  #2 IgG deficiency #3 history of tubular adenomatous colon polyps-up-to-date with colonoscopy last done May 2021 #4 history of anal AIN found on biopsy of hypertrophied anal papillae 12/2019-was referred to surgery will need to follow-up on that work-up.  #5 history of lung cancer status postresection #6.  Status post Roux-en-Y gastrojejunostomy #7 GERD with history of esophagitis-on Dexilant   Plan; As patient is still on IV antibiotics, will plan to continue vancomycin for 125 mg p.o. 4 times daily for an additional week, to complete 3 weeks of treatment. Continue Florastor twice daily x6 weeks Continue soft bland diet until diarrhea resolves and then gradually advance CBC and be met today Keep follow-up with infectious disease next week Patient is asked to call back for advice if she is having persistent diarrhea when she gets close to completing the third week of vancomycin, and can determine at that time whether to do an additional tapered dose of vancomycin. Will request copies of surgical notes regarding anal AIN   Tiernan Suto Genia Harold PA-C 01/29/2021   Cc: [name], NP

## 2021-01-29 NOTE — Telephone Encounter (Signed)
Pt seen Dr Sabra Heck on 6/14 & forgot to ask or mention that she would like a referral or recommendations for Acupuncture.  Please advise Megan Brewer

## 2021-01-29 NOTE — Progress Notes (Signed)
Agree with assessment and plan as outlined.  

## 2021-01-29 NOTE — Telephone Encounter (Signed)
Left voice message stating that Dr Sabra Heck didn't have an Acupuncturist in mind, but Ms Merle was welcome to call us with name of one she prefers IF she needs referral before scheduling.  Thanks, Kathyrn Lass

## 2021-01-29 NOTE — Patient Instructions (Addendum)
If you are age 73 or older, your body mass index should be between 23-30. Your Body mass index is 21.77 kg/m. If this is out of the aforementioned range listed, please consider follow up with your Primary Care Provider. __________________________________________________________  The [facility] providers would like to encourage you to use [facility] to communicate with providers for non-urgent requests or questions.  Due to long hold times on the telephone, sending your provider a message by [facility] may be a faster and more efficient way to get a response.  Please allow 48 business hours for a response.  Please remember that this is for non-urgent requests.   Continue Florastor twice daily for 6 weeks  We have sent in 1 additional week of Vancomycin to your pharmacy (For a total of 3 week)  Continue bland diet, and push fluids  If symptoms persist call the office and speak with Amy's nurse, [place] provider has requested that you go to the basement level for lab work before leaving today. Press "B" on the elevator. The lab is located at the first door on the left as you exit the elevator.  Follow up as needed for now  Thank you for entrusting me with your care and choosing [facility].  Amy Esterwood, PA-C

## 2021-02-04 ENCOUNTER — Other Ambulatory Visit: Payer: Self-pay

## 2021-02-04 ENCOUNTER — Encounter: Payer: Self-pay | Admitting: Internal Medicine

## 2021-02-04 ENCOUNTER — Inpatient Hospital Stay (HOSPITAL_COMMUNITY): Payer: Medicare PPO

## 2021-02-04 ENCOUNTER — Inpatient Hospital Stay (HOSPITAL_COMMUNITY)
Admission: AD | Admit: 2021-02-04 | Discharge: 2021-02-14 | DRG: 315 | Disposition: A | Discharge status: Home / Self Care | Payer: Medicare PPO | Source: Ambulatory Visit | Attending: Internal Medicine | Admitting: Internal Medicine

## 2021-02-04 ENCOUNTER — Encounter (HOSPITAL_COMMUNITY): Payer: Self-pay | Admitting: Family Medicine

## 2021-02-04 ENCOUNTER — Ambulatory Visit: Payer: Medicare PPO | Admitting: Internal Medicine

## 2021-02-04 VITALS — BP 110/64 | HR 83 | Temp 97.4°F | Ht 62.0 in | Wt 122.0 lb

## 2021-02-04 DIAGNOSIS — Z79899 Other long term (current) drug therapy: Secondary | ICD-10-CM

## 2021-02-04 DIAGNOSIS — F319 Bipolar disorder, unspecified: Secondary | ICD-10-CM | POA: Diagnosis present

## 2021-02-04 DIAGNOSIS — Z8661 Personal history of infections of the central nervous system: Secondary | ICD-10-CM

## 2021-02-04 DIAGNOSIS — Z881 Allergy status to other antibiotic agents status: Secondary | ICD-10-CM | POA: Diagnosis not present

## 2021-02-04 DIAGNOSIS — Z9682 Presence of neurostimulator: Secondary | ICD-10-CM

## 2021-02-04 DIAGNOSIS — R6889 Other general symptoms and signs: Secondary | ICD-10-CM | POA: Diagnosis not present

## 2021-02-04 DIAGNOSIS — Z95828 Presence of other vascular implants and grafts: Secondary | ICD-10-CM

## 2021-02-04 DIAGNOSIS — Z9841 Cataract extraction status, right eye: Secondary | ICD-10-CM

## 2021-02-04 DIAGNOSIS — R200 Anesthesia of skin: Secondary | ICD-10-CM | POA: Diagnosis not present

## 2021-02-04 DIAGNOSIS — Z85118 Personal history of other malignant neoplasm of bronchus and lung: Secondary | ICD-10-CM

## 2021-02-04 DIAGNOSIS — Y92239 Unspecified place in hospital as the place of occurrence of the external cause: Secondary | ICD-10-CM | POA: Diagnosis not present

## 2021-02-04 DIAGNOSIS — Z9109 Other allergy status, other than to drugs and biological substances: Secondary | ICD-10-CM

## 2021-02-04 DIAGNOSIS — Z87898 Personal history of other specified conditions: Secondary | ICD-10-CM | POA: Diagnosis not present

## 2021-02-04 DIAGNOSIS — Z90710 Acquired absence of both cervix and uterus: Secondary | ICD-10-CM

## 2021-02-04 DIAGNOSIS — F3162 Bipolar disorder, current episode mixed, moderate: Secondary | ICD-10-CM

## 2021-02-04 DIAGNOSIS — Z9884 Bariatric surgery status: Secondary | ICD-10-CM | POA: Diagnosis not present

## 2021-02-04 DIAGNOSIS — F419 Anxiety disorder, unspecified: Secondary | ICD-10-CM

## 2021-02-04 DIAGNOSIS — L0291 Cutaneous abscess, unspecified: Secondary | ICD-10-CM | POA: Diagnosis not present

## 2021-02-04 DIAGNOSIS — G2581 Restless legs syndrome: Secondary | ICD-10-CM | POA: Diagnosis present

## 2021-02-04 DIAGNOSIS — A0472 Enterocolitis due to Clostridium difficile, not specified as recurrent: Secondary | ICD-10-CM

## 2021-02-04 DIAGNOSIS — M7989 Other specified soft tissue disorders: Secondary | ICD-10-CM | POA: Diagnosis present

## 2021-02-04 DIAGNOSIS — K219 Gastro-esophageal reflux disease without esophagitis: Secondary | ICD-10-CM | POA: Diagnosis present

## 2021-02-04 DIAGNOSIS — T80211A Bloodstream infection due to central venous catheter, initial encounter: Secondary | ICD-10-CM | POA: Diagnosis present

## 2021-02-04 DIAGNOSIS — Z8262 Family history of osteoporosis: Secondary | ICD-10-CM | POA: Diagnosis not present

## 2021-02-04 DIAGNOSIS — Z8249 Family history of ischemic heart disease and other diseases of the circulatory system: Secondary | ICD-10-CM

## 2021-02-04 DIAGNOSIS — Z20822 Contact with and (suspected) exposure to covid-19: Secondary | ICD-10-CM | POA: Diagnosis present

## 2021-02-04 DIAGNOSIS — R7881 Bacteremia: Secondary | ICD-10-CM | POA: Diagnosis present

## 2021-02-04 DIAGNOSIS — E538 Deficiency of other specified B group vitamins: Secondary | ICD-10-CM | POA: Diagnosis present

## 2021-02-04 DIAGNOSIS — Z9842 Cataract extraction status, left eye: Secondary | ICD-10-CM

## 2021-02-04 DIAGNOSIS — Z818 Family history of other mental and behavioral disorders: Secondary | ICD-10-CM | POA: Diagnosis not present

## 2021-02-04 DIAGNOSIS — Y838 Other surgical procedures as the cause of abnormal reaction of the patient, or of later complication, without mention of misadventure at the time of the procedure: Secondary | ICD-10-CM | POA: Diagnosis present

## 2021-02-04 DIAGNOSIS — Z9049 Acquired absence of other specified parts of digestive tract: Secondary | ICD-10-CM | POA: Diagnosis not present

## 2021-02-04 DIAGNOSIS — L8941 Pressure ulcer of contiguous site of back, buttock and hip, stage 1: Secondary | ICD-10-CM

## 2021-02-04 DIAGNOSIS — A0471 Enterocolitis due to Clostridium difficile, recurrent: Secondary | ICD-10-CM | POA: Diagnosis present

## 2021-02-04 DIAGNOSIS — E039 Hypothyroidism, unspecified: Secondary | ICD-10-CM | POA: Diagnosis present

## 2021-02-04 DIAGNOSIS — G43909 Migraine, unspecified, not intractable, without status migrainosus: Secondary | ICD-10-CM | POA: Diagnosis present

## 2021-02-04 DIAGNOSIS — G4730 Sleep apnea, unspecified: Secondary | ICD-10-CM | POA: Diagnosis present

## 2021-02-04 DIAGNOSIS — R197 Diarrhea, unspecified: Secondary | ICD-10-CM | POA: Diagnosis not present

## 2021-02-04 DIAGNOSIS — Z8261 Family history of arthritis: Secondary | ICD-10-CM | POA: Diagnosis not present

## 2021-02-04 DIAGNOSIS — I10 Essential (primary) hypertension: Secondary | ICD-10-CM | POA: Diagnosis present

## 2021-02-04 DIAGNOSIS — Z820 Family history of epilepsy and other diseases of the nervous system: Secondary | ICD-10-CM

## 2021-02-04 DIAGNOSIS — D801 Nonfamilial hypogammaglobulinemia: Secondary | ICD-10-CM | POA: Diagnosis present

## 2021-02-04 DIAGNOSIS — B9689 Other specified bacterial agents as the cause of diseases classified elsewhere: Secondary | ICD-10-CM | POA: Diagnosis present

## 2021-02-04 DIAGNOSIS — L298 Other pruritus: Secondary | ICD-10-CM | POA: Diagnosis not present

## 2021-02-04 DIAGNOSIS — Z825 Family history of asthma and other chronic lower respiratory diseases: Secondary | ICD-10-CM

## 2021-02-04 DIAGNOSIS — F99 Mental disorder, not otherwise specified: Secondary | ICD-10-CM

## 2021-02-04 DIAGNOSIS — R339 Retention of urine, unspecified: Secondary | ICD-10-CM | POA: Diagnosis present

## 2021-02-04 DIAGNOSIS — N3281 Overactive bladder: Secondary | ICD-10-CM | POA: Diagnosis present

## 2021-02-04 DIAGNOSIS — Z7984 Long term (current) use of oral hypoglycemic drugs: Secondary | ICD-10-CM

## 2021-02-04 DIAGNOSIS — F5105 Insomnia due to other mental disorder: Secondary | ICD-10-CM

## 2021-02-04 DIAGNOSIS — T3695XA Adverse effect of unspecified systemic antibiotic, initial encounter: Secondary | ICD-10-CM | POA: Diagnosis not present

## 2021-02-04 LAB — CBC WITH DIFFERENTIAL/PLATELET
Abs Immature Granulocytes: 0.03 10*3/uL (ref 0.00–0.07)
Basophils Absolute: 0.1 10*3/uL (ref 0.0–0.1)
Basophils Relative: 1 %
Eosinophils Absolute: 0 10*3/uL (ref 0.0–0.5)
Eosinophils Relative: 0 %
HCT: 38.5 % (ref 36.0–46.0)
Hemoglobin: 12.2 g/dL (ref 12.0–15.0)
Immature Granulocytes: 1 %
Lymphocytes Relative: 18 %
Lymphs Abs: 1.1 10*3/uL (ref 0.7–4.0)
MCH: 29.5 pg (ref 26.0–34.0)
MCHC: 31.7 g/dL (ref 30.0–36.0)
MCV: 93 fL (ref 80.0–100.0)
Monocytes Absolute: 0.6 10*3/uL (ref 0.1–1.0)
Monocytes Relative: 10 %
Neutro Abs: 4.3 10*3/uL (ref 1.7–7.7)
Neutrophils Relative %: 70 %
Platelets: 200 10*3/uL (ref 150–400)
RBC: 4.14 MIL/uL (ref 3.87–5.11)
RDW: 13.6 % (ref 11.5–15.5)
WBC: 6.2 10*3/uL (ref 4.0–10.5)
nRBC: 0 % (ref 0.0–0.2)

## 2021-02-04 LAB — COMPREHENSIVE METABOLIC PANEL
ALT: 32 U/L (ref 0–44)
AST: 35 U/L (ref 15–41)
Albumin: 3.6 g/dL (ref 3.5–5.0)
Alkaline Phosphatase: 78 U/L (ref 38–126)
Anion gap: 7 (ref 5–15)
BUN: 34 mg/dL — ABNORMAL HIGH (ref 8–23)
CO2: 28 mmol/L (ref 22–32)
Calcium: 8.7 mg/dL — ABNORMAL LOW (ref 8.9–10.3)
Chloride: 103 mmol/L (ref 98–111)
Creatinine, Ser: 1.11 mg/dL — ABNORMAL HIGH (ref 0.44–1.00)
GFR, Estimated: 53 mL/min — ABNORMAL LOW (ref >60)
Glucose, Bld: 93 mg/dL (ref 70–99)
Potassium: 5.4 mmol/L — ABNORMAL HIGH (ref 3.5–5.1)
Sodium: 138 mmol/L (ref 135–145)
Total Bilirubin: 0.3 mg/dL (ref 0.3–1.2)
Total Protein: 8.1 g/dL (ref 6.5–8.1)

## 2021-02-04 LAB — RESP PANEL BY RT-PCR (FLU A&B, COVID) ARPGX2
Influenza A by PCR: NEGATIVE
Influenza B by PCR: NEGATIVE
SARS Coronavirus 2 by RT PCR: NEGATIVE

## 2021-02-04 LAB — PROCALCITONIN: Procalcitonin: <0.1 ng/mL

## 2021-02-04 MED ORDER — KETOTIFEN FUMARATE 0.025 % OP SOLN
1.0000 [drp] | Freq: Every day | OPHTHALMIC | Status: DC
  Administered 2021-02-05 – 2021-02-14 (×10): 1 [drp] via OPHTHALMIC
  Filled 2021-02-04: qty 5

## 2021-02-04 MED ORDER — LAMOTRIGINE 100 MG PO TABS
200.0000 mg | ORAL_TABLET | Freq: Every day | ORAL | Status: DC
  Administered 2021-02-04 – 2021-02-13 (×10): 200 mg via ORAL
  Filled 2021-02-04 (×10): qty 2

## 2021-02-04 MED ORDER — OXYBUTYNIN CHLORIDE 5 MG/5ML PO SYRP
2.5000 mg | ORAL_SOLUTION | Freq: Two times a day (BID) | ORAL | Status: DC
  Administered 2021-02-04 – 2021-02-14 (×20): 2.5 mg via ORAL
  Filled 2021-02-04 (×20): qty 2.5

## 2021-02-04 MED ORDER — ENOXAPARIN SODIUM 40 MG/0.4ML IJ SOSY
40.0000 mg | PREFILLED_SYRINGE | INTRAMUSCULAR | Status: DC
  Administered 2021-02-04 – 2021-02-13 (×10): 40 mg via SUBCUTANEOUS
  Filled 2021-02-04 (×10): qty 0.4

## 2021-02-04 MED ORDER — LEVOTHYROXINE SODIUM 75 MCG PO TABS
75.0000 ug | ORAL_TABLET | Freq: Every day | ORAL | Status: DC
  Administered 2021-02-05 – 2021-02-14 (×10): 75 ug via ORAL
  Filled 2021-02-04 (×10): qty 1

## 2021-02-04 MED ORDER — PANTOPRAZOLE SODIUM 40 MG PO TBEC
40.0000 mg | DELAYED_RELEASE_TABLET | Freq: Every day | ORAL | Status: DC
  Administered 2021-02-05 – 2021-02-14 (×10): 40 mg via ORAL
  Filled 2021-02-04 (×10): qty 1

## 2021-02-04 MED ORDER — SACCHAROMYCES BOULARDII 250 MG PO CAPS
250.0000 mg | ORAL_CAPSULE | Freq: Two times a day (BID) | ORAL | Status: DC
  Administered 2021-02-04 – 2021-02-14 (×20): 250 mg via ORAL
  Filled 2021-02-04 (×20): qty 1

## 2021-02-04 MED ORDER — FLUTICASONE PROPIONATE 50 MCG/ACT NA SUSP: 1.0000 | Freq: Two times a day (BID) | NASAL | Status: DC | PRN

## 2021-02-04 MED ORDER — VANCOMYCIN HCL 125 MG PO CAPS
125.0000 mg | ORAL_CAPSULE | Freq: Four times a day (QID) | ORAL | Status: DC
  Administered 2021-02-04 – 2021-02-13 (×35): 125 mg via ORAL
  Filled 2021-02-04 (×40): qty 1

## 2021-02-04 MED ORDER — ACETAMINOPHEN 650 MG RE SUPP
650.0000 mg | Freq: Four times a day (QID) | RECTAL | Status: DC | PRN
  Filled 2021-02-04: qty 1

## 2021-02-04 MED ORDER — SODIUM CHLORIDE 0.9% FLUSH
10.0000 mL | Freq: Two times a day (BID) | INTRAVENOUS | Status: DC
  Administered 2021-02-04 – 2021-02-07 (×5): 10 mL

## 2021-02-04 MED ORDER — MAGNESIUM OXIDE -MG SUPPLEMENT 400 (240 MG) MG PO TABS
400.0000 mg | ORAL_TABLET | Freq: Every day | ORAL | Status: DC
  Administered 2021-02-05 – 2021-02-14 (×10): 400 mg via ORAL
  Filled 2021-02-04 (×10): qty 1

## 2021-02-04 MED ORDER — POLYETHYLENE GLYCOL 3350 17 G PO PACK: 17.0000 g | PACK | Freq: Every day | ORAL | Status: DC | PRN

## 2021-02-04 MED ORDER — LACTATED RINGERS IV SOLN: INTRAVENOUS | Status: AC

## 2021-02-04 MED ORDER — ESTRADIOL 0.1 MG/GM VA CREA
1.0000 | TOPICAL_CREAM | VAGINAL | Status: DC
  Administered 2021-02-07 – 2021-02-10 (×2): 1 via VAGINAL
  Filled 2021-02-04: qty 42.5

## 2021-02-04 MED ORDER — ACETAMINOPHEN 325 MG PO TABS
650.0000 mg | ORAL_TABLET | Freq: Four times a day (QID) | ORAL | Status: DC | PRN
  Administered 2021-02-05 – 2021-02-13 (×11): 650 mg via ORAL
  Filled 2021-02-04 (×11): qty 2

## 2021-02-04 MED ORDER — CHLORHEXIDINE GLUCONATE CLOTH 2 % EX PADS
6.0000 | MEDICATED_PAD | Freq: Every day | CUTANEOUS | Status: DC
  Administered 2021-02-04 – 2021-02-08 (×5): 6 via TOPICAL

## 2021-02-04 MED ORDER — TAMSULOSIN HCL 0.4 MG PO CAPS
0.4000 mg | ORAL_CAPSULE | Freq: Every day | ORAL | Status: DC
  Administered 2021-02-05 – 2021-02-14 (×10): 0.4 mg via ORAL
  Filled 2021-02-04 (×10): qty 1

## 2021-02-04 MED ORDER — AMLODIPINE BESYLATE 10 MG PO TABS
10.0000 mg | ORAL_TABLET | Freq: Every day | ORAL | Status: DC
  Administered 2021-02-05 – 2021-02-14 (×10): 10 mg via ORAL
  Filled 2021-02-04 (×10): qty 1

## 2021-02-04 MED ORDER — VITAMIN D 25 MCG (1000 UNIT) PO TABS
1000.0000 [IU] | ORAL_TABLET | Freq: Every day | ORAL | Status: DC
  Administered 2021-02-05 – 2021-02-14 (×10): 1000 [IU] via ORAL
  Filled 2021-02-04 (×10): qty 1

## 2021-02-04 MED ORDER — SUCRALFATE 1 GM/10ML PO SUSP: 2.0000 g | Freq: Three times a day (TID) | ORAL | Status: DC | PRN

## 2021-02-04 MED ORDER — OXYCODONE HCL 5 MG PO TABS
5.0000 mg | ORAL_TABLET | ORAL | Status: DC | PRN
  Administered 2021-02-04 – 2021-02-12 (×16): 5 mg via ORAL
  Filled 2021-02-04 (×16): qty 1

## 2021-02-04 MED ORDER — POTASSIUM CHLORIDE CRYS ER 10 MEQ PO TBCR
40.0000 meq | EXTENDED_RELEASE_TABLET | Freq: Every day | ORAL | Status: DC
  Administered 2021-02-05 – 2021-02-14 (×10): 40 meq via ORAL
  Filled 2021-02-04 (×10): qty 4

## 2021-02-04 MED ORDER — HYDROXYZINE HCL 25 MG PO TABS
25.0000 mg | ORAL_TABLET | Freq: Two times a day (BID) | ORAL | Status: DC | PRN
  Administered 2021-02-04: 25 mg via ORAL
  Filled 2021-02-04: qty 1

## 2021-02-04 MED ORDER — TIZANIDINE HCL 4 MG PO TABS: 4.0000 mg | ORAL_TABLET | Freq: Four times a day (QID) | ORAL | Status: DC | PRN

## 2021-02-04 MED ORDER — BACLOFEN 10 MG PO TABS
10.0000 mg | ORAL_TABLET | Freq: Every day | ORAL | Status: DC | PRN
  Administered 2021-02-04 – 2021-02-05 (×2): 10 mg via ORAL
  Filled 2021-02-04 (×2): qty 1

## 2021-02-04 MED ORDER — ROPINIROLE HCL 1 MG PO TABS
1.0000 mg | ORAL_TABLET | Freq: Every day | ORAL | Status: DC
  Administered 2021-02-04 – 2021-02-13 (×10): 1 mg via ORAL
  Filled 2021-02-04 (×10): qty 1

## 2021-02-04 MED ORDER — CALCIUM CITRATE 950 (200 CA) MG PO TABS
200.0000 mg | ORAL_TABLET | Freq: Every day | ORAL | Status: DC
  Administered 2021-02-05 – 2021-02-14 (×10): 200 mg via ORAL
  Filled 2021-02-04 (×10): qty 1

## 2021-02-04 MED ORDER — MIRTAZAPINE 15 MG PO TABS
30.0000 mg | ORAL_TABLET | Freq: Every day | ORAL | Status: DC
  Administered 2021-02-04 – 2021-02-13 (×10): 30 mg via ORAL
  Filled 2021-02-04 (×10): qty 2

## 2021-02-04 MED ORDER — SODIUM CHLORIDE 0.9% FLUSH
10.0000 mL | INTRAVENOUS | Status: DC | PRN
  Administered 2021-02-04: 10 mL

## 2021-02-04 MED ORDER — SODIUM CHLORIDE 0.9 % IV SOLN
2.0000 g | Freq: Two times a day (BID) | INTRAVENOUS | Status: DC
  Administered 2021-02-04 – 2021-02-05 (×2): 2 g via INTRAVENOUS
  Filled 2021-02-04 (×2): qty 2

## 2021-02-04 MED ORDER — AZELASTINE HCL 0.1 % NA SOLN: 1.0000 | Freq: Two times a day (BID) | NASAL | Status: DC | PRN

## 2021-02-04 MED ORDER — CYCLOSPORINE 0.05 % OP EMUL
1.0000 [drp] | Freq: Two times a day (BID) | OPHTHALMIC | Status: DC
  Administered 2021-02-04 – 2021-02-14 (×20): 1 [drp] via OPHTHALMIC
  Filled 2021-02-04 (×20): qty 1

## 2021-02-04 MED ORDER — FERROUS SULFATE 325 (65 FE) MG PO TABS
650.0000 mg | ORAL_TABLET | Freq: Every day | ORAL | Status: DC
  Administered 2021-02-05 – 2021-02-14 (×10): 650 mg via ORAL
  Filled 2021-02-04 (×11): qty 2

## 2021-02-04 MED ORDER — AZELASTINE-FLUTICASONE 137-50 MCG/ACT NA SUSP: 1.0000 | Freq: Two times a day (BID) | NASAL | Status: DC | PRN

## 2021-02-04 NOTE — Progress Notes (Signed)
Pharmacy Antibiotic Note  Megan Brewer is a 75 y.o. female recently treated for C diff and gram negative bacteremia admitted on 02/04/2021 with rigors in the setting of recently treated gram negative bacteremia.  Pharmacy has been consulted for cefepime dosing.  Plan: Cefepime 2 g iv q 12 h  Will f/u renal function, culture results, and clinical course     Temp (24hrs), Avg:97.7 F (36.5 C), Min:97.4 F (36.3 C), Max:98.3 F (36.8 C)  Recent Labs  Lab 01/29/21 1205 02/04/21 1711  WBC 9.0 6.2  CREATININE 1.12  --     Estimated Creatinine Clearance: 35.9 mL/min (by C-G formula based on SCr of 1.12 mg/dL).    Allergies  Allergen Reactions   Imitrex [Sumatriptan] Nausea And Vomiting    Ringing in the ears, migraines are worse   Tetracycline Nausea Only    Causes ringing in ears, dizziness, migraine, nausea   Corticosteroids Other (See Comments)    12/02/2016 interview by [facility]: Oral dosing causes migraines/nausea/tinnitus. Prev tolerated IV and intranasal admin w/o difficulty.   Cefixime Other (See Comments)    Headache    Ciprofloxacin Other (See Comments)    Headache, dizziness, ringing in the ears   Doxycycline Other (See Comments)    unknown   Gabapentin     Throat swells/closes   Isometheptene-Dichloral-Apap Other (See Comments)    unknown   Ketorolac     12/02/2016 interview by DJS: Oral dosing prednisone/corticosteroids causes migraines/nausea/tinnitus. Prev tolerated IV and intranasal admin w/o difficulty.   Prednisone Other (See Comments)    Migraine, dizzy, ringing in ears-can take it IV 12/02/2016 interview by [name]: Oral prednisone dosing causes migraines/nausea/tinnitus. Prev tolerated IV and intranasal admin w/o difficulty.   Promethazine Other (See Comments)    causes severe abdominal pain when taken IV; however she can take PO or IM   Stadol [Butorphanol]     Cerebral pain   Erythromycin Base Diarrhea and Itching    Severe diarrhea   Propoxyphene  Nausea Only    Thank you for allowing pharmacy to be a part of this patient's care.  Ulice Dash D 02/04/2021 6:02 PM

## 2021-02-04 NOTE — Progress Notes (Signed)
RFV: follow up from hospitalization for gram negative bacteremia and cdifficile Patient ID: Megan Brewer, female   DOB: Feb 13, 1946, 75 y.o.   MRN: [id number]  HPI 75yo F  with a history of NSCLC with surgery and chemo in 2011, CVID, bipolar, chronic pain with neurostimulator, CKD, hypothyroid, migraines who was admitted on 5/31 with AMS and fevers and diarrhea. She was found to have c.difficile diarrhea in addition to bacteremia. (It was slow growing, non fermenter-sent to [facility] for identification --but still pending) she was treated with 2 weeks of cefepime which ended on 6/15. She was continued on oral vancomycin 125mg  QID. She reprots that her diarrhea is slightly improved. Had 3 bm thus far, but overall feeling poorly over the last 5 days. In clinic, feeling chills, having rigors. No pain at port site  Outpatient Encounter Medications as of 02/04/2021  Medication Sig   acetaminophen (TYLENOL) 500 MG tablet Take 1-2 tablets (500-1,000 mg total) by mouth every 8 (eight) hours as needed (Max dose 3000 mg in 24 hours).   amLODipine (NORVASC) 10 MG tablet Take 1 tablet (10 mg total) by mouth daily.   Azelastine-Fluticasone 137-50 MCG/ACT SUSP ONE SPRAY EACH NOSTRIL TWICE A DAY AS NEEDED (Patient taking differently: Place 1 spray into both nostrils 2 (two) times daily as needed (allergies). ONE SPRAY EACH NOSTRIL TWICE A DAY AS NEEDED)   baclofen (LIORESAL) 10 MG tablet Take 10 mg by mouth daily as needed for muscle spasms.   Calcium Citrate 200 MG TABS Take 1 tablet by mouth daily.   cholecalciferol (VITAMIN D3) 25 MCG (1000 UNIT) tablet Take 1,000 Units by mouth daily.   cycloSPORINE (RESTASIS) 0.05 % ophthalmic emulsion Place 1 drop into both eyes 2 (two) times daily.   dexlansoprazole (DEXILANT) 60 MG capsule Take 1 capsule (60 mg total) by mouth daily. Crack open the capsule and pour into spoon prior to ingestion. PLEASE SCHEDULE AN OFFICE VISIT FOR FURTHER REFILLS. Thank you    Erenumab-aooe (AIMOVIG, 140 MG DOSE,) 70 MG/ML SOAJ Inject into the skin every 30 (thirty) days.   estradiol (ESTRACE) 0.1 MG/GM vaginal cream Place 1 Applicatorful vaginally 3 (three) times a week.   ferrous sulfate 325 (65 FE) MG tablet Take 650 mg by mouth daily with breakfast.   hydrOXYzine (VISTARIL) 25 MG capsule TAKE ONE CAPSULE BY MOUTH TWICE DAILY AS NEEDED (Patient taking differently: Take 25 mg by mouth 2 (two) times daily as needed for anxiety.)   ibuprofen (ADVIL) 200 MG tablet Take 200 mg by mouth every 6 (six) hours as needed for mild pain.   immune globulin, human, (GAMMAGARD S/D LESS IGA) 10 g injection Inject into the vein every 21 ( twenty-one) days.    lamoTRIgine (LAMICTAL) 200 MG tablet TAKE 1 TABLET BY MOUTH DAILY (Patient taking differently: Take 200 mg by mouth at bedtime.)   LASTACAFT 0.25 % SOLN Apply 1 drop to eye daily.   levothyroxine (SYNTHROID) 75 MCG tablet Take one tablet by mouth once daily 30 minutes before breakfast on empty stomach.   magnesium oxide (MAG-OX) 400 MG tablet Take 400 mg by mouth daily.    mirtazapine (REMERON) 30 MG tablet Take 1/2 tablet at bedtime for one week, then increase to 1 tablet at bedtime   oxybutynin (DITROPAN) 5 MG/5ML syrup Take 2.5 mLs (2.5 mg total) by mouth 2 (two) times daily.   potassium chloride SA (KLOR-CON) 20 MEQ tablet TAKE TWO TABLETS BY MOUTH DAILY (MAY DISSOLVE IN WATER)   rOPINIRole (REQUIP)  1 MG tablet TAKE 1 TABLET BY MOUTH AT BEDTIME   saccharomyces boulardii (FLORASTOR) 250 MG capsule Take 1 capsule (250 mg total) by mouth 2 (two) times daily.   sucralfate (CARAFATE) 1 GM/10ML suspension Take 2 tsp by mouth three times daily as needed. (Patient taking differently: Take 2 g by mouth 3 (three) times daily as needed (gerd).)   tamsulosin (FLOMAX) 0.4 MG CAPS capsule Take 1 capsule (0.4 mg total) by mouth daily.   tiZANidine (ZANAFLEX) 4 MG tablet Take 1 tablet (4 mg total) by mouth every 6 (six) hours as needed for  muscle spasms.   torsemide (DEMADEX) 20 MG tablet TAKE 1 TABLET BY MOUTH DAILY   triamcinolone cream (KENALOG) 0.1 % Apply 1 application topically 2 (two) times daily as needed (rash).   UNABLE TO FIND Med Name: [name] 1 by mouth daily for UTI prevention   vancomycin (VANCOCIN) 125 MG capsule Take 1 capsule (125 mg total) by mouth 4 (four) times daily for 7 days.   ZINC OXIDE, TOPICAL, 10 % CREA Apply 1 application topically in the morning and at bedtime. (Patient taking differently: Apply 1 application topically 2 (two) times daily as needed (rash).)   No facility-administered encounter medications on file as of 02/04/2021.     Patient Active Problem List   Diagnosis Date Noted   Acute metabolic encephalopathy 21/19/4174   SIRS (systemic inflammatory response syndrome) (HCC) 01/14/2021   Elevated troponin 01/14/2021   Hypokalemia 01/14/2021   Headache 01/14/2021   Chest discomfort 03/22/2019   Weight loss 01/27/2019   Acquired hypothyroidism 01/27/2019   Esophagitis 01/27/2019   Intractable migraine without status migrainosus 01/27/2019   Osteoporosis 01/27/2019   Chronic sinusitis 12/28/2018   Food intolerance/GI symptoms 12/28/2018   Acute maxillary sinusitis 12/28/2018   Lower leg edema 12/20/2018   Chronic pain of both shoulders 05/24/2018   Bilateral leg edema 05/24/2018   Chronic pain syndrome 03/31/2018   Left bundle branch block 10/28/2017   Palpitations 10/28/2017   History of gastric bypass 05/21/2017   Polypharmacy 02/11/2017   Senile nuclear sclerosis 11/26/2016   Chronic post-thoracotomy pain 04/09/2014   Chronic kidney disease 04/09/2014   Sleep disturbances 09/20/2012   Restless legs syndrome 07/07/2012   Hypertonicity of bladder 05/23/2012   Essential tremor 04/01/2012   Memory loss 04/01/2012   Incomplete emptying of bladder 01/14/2012   Urge incontinence 10/28/2011   Tension type headache 06/10/2011   Mixed urge and stress incontinence 05/28/2011    Hypogammaglobulinemia ([place]) 06/22/2010   Severe episode of recurrent major depressive disorder, without psychotic features ([place]) 06/22/2010   Hypothyroidism 02/12/2010   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/2010   ARACHNOIDITIS 01/31/2010   OBSTRUCTIVE SLEEP APNEA 01/31/2010   Chronic rhinitis 01/31/2010   Arachnoiditis 01/31/2010     Health Maintenance Due  Topic Date Due   Zoster Vaccines- Shingrix (2 of 2) 08/12/2017    Social History   Tobacco Use   Smoking status: Never   Smokeless tobacco: Never  Vaping Use   Vaping Use: Never used  Substance Use Topics   Alcohol use: Not Currently   Drug use: Never    Review of Systems 12 point ros is negative except what is mentioned in hpi Physical Exam   Ht 5\' 2"  (1.575 m)   Wt 122 lb (55.3 kg)   BMI 22.31 kg/m   Physical Exam  Constitutional:  oriented to person, place, and time. appears chronically ill and mild distress with rigors.  HENT: [place]/AT, PERRLA, no  scleral icterus Mouth/Throat: Oropharynx is clear and moist. No oropharyngeal exudate.  Cardiovascular: Normal rate, regular rhythm and normal heart sounds. Exam reveals no gallop and no friction rub.  No murmur heard.  Pulmonary/Chest: Effort normal and breath sounds normal. No respiratory distress.  has no wheezes.  Chest wall = no tenderness to port Abdominal: Soft. Bowel sounds are normal.  exhibits no distension. There is no tenderness.  Lymphadenopathy: no cervical adenopathy. No axillary adenopathy Neurological: alert and oriented to person, place, and time.  Skin: Skin is warm and dry. No rash noted. No erythema.  Psychiatric: a normal mood and affect.  behavior is normal.    CBC Lab Results  Component Value Date   WBC 9.0 01/29/2021   RBC 4.31 01/29/2021   HGB 12.8 01/29/2021   HCT 38.3 01/29/2021   PLT 304.0 01/29/2021   MCV 88.7 01/29/2021   MCH 29.4 01/22/2021   MCHC 33.6 01/29/2021   RDW 14.6 01/29/2021   LYMPHSABS 1.9 01/29/2021   MONOABS  0.8 01/29/2021   EOSABS 0.1 01/29/2021    BMET Lab Results  Component Value Date   NA 136 01/29/2021   K 4.3 01/29/2021   CL 101 01/29/2021   CO2 25 01/29/2021   GLUCOSE 99 01/29/2021   BUN 37 (H) 01/29/2021   CREATININE 1.12 01/29/2021   CALCIUM 10.3 01/29/2021   GFRNONAA >60 01/22/2021   GFRAA 38 (L) 06/10/2020      Assessment and Plan 75yo F immunocompromised host who recently finished 2 wk hx of treatment for GNR bacteremia in addition to being treated for cdifficile  Given state to health at this visit, concern that she may have SIRS/developing sepsis. She has rigors, poor po intake, we will plan to Admit to hospital to evaluate for sepsis. If she has recurrent bacteremia, may need to remove port. Spoke with regalado given my concern  C.difficile = plan to continue on oral vanco 125mg  QID for 10 days. Consider to do taper if still has ongoing diarrhea at the end of 10 days. If recurs, she would benefit from zinplava.  Spent 60 min with patient in treating rigors, c.difficile, and treatment of bacteremia

## 2021-02-04 NOTE — Progress Notes (Signed)
Patient arrived to room 1511. VSS. Patient shaking, chilly, and looks generally ill. Explained plan of care to patient. Reviewed medical history. Assisted patient into the bed and reviewed admission information such as call bell information, and fall precautions.

## 2021-02-04 NOTE — H&P (Signed)
History and Physical    [name] UYQ:[id number] DOB: [birth year] DOA: 02/04/2021  PCP: [name], NP  Patient coming from: home  I have personally briefly reviewed patient's old medical records in [place]  Chief Complaint: feels weak  HPI: Megan Brewer is Megan Brewer 75 y.o. female with medical history significant of bipolar disorder, lung cancer, c diff, hypogammaglobulinemia, hypothyroidism, hx gastric bypass, and mutliple other medical issues presents as [name] direct admission from the ID clinic with rigors and general malaise.   She was recently discharged from the hospital for gram negative bacteria and c diff infection.  She completed [name] 14 day course of cefepime on 6/15 and over the past 3-4 days she notes feeling more poorly.  She notes fatigue, rigors, generalized weakness, and legs feeling like rubber.  She notes her diarrhea is still present, though [name] little better.  She notes dyspnea with exertion and pounding in her head and chest when she exerts herself.  She notes sharp abdominal discomfort in her lower abdomen.  Denies dysuria.  No smoking or drinking.  She had rigors in ID clinic and due to her decline and concerning symptoms, Dr. Baxter Flattery sent her here for [name] direct admission.  Review of Systems: As per HPI otherwise all other systems reviewed and are negative.  Past Medical History:  Diagnosis Date   Anemia    Arachnoiditis    Bipolar disorder (HCC)    C. 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[place])    Lung cancer ([place]) 2011, 2014   Memory loss    Migraine    Opioid abuse ([place])    Osteoporosis    Presence of neurostimulator    Restless legs    SIRS (systemic inflammatory response syndrome) ([place])     Sleep apnea     Past Surgical History:  Procedure Laterality Date   ABDOMINAL HYSTERECTOMY     CARPAL TUNNEL RELEASE     CATARACT EXTRACTION Bilateral 2019   CHOLECYSTECTOMY     COLONOSCOPY  2015   [facility]   CT LUNG SCREENING  2018   DG  BONE DENSITY ([place] HX)  2018   DIAGNOSTIC MAMMOGRAM  2019   GASTRIC BYPASS     LUNG CANCER SURGERY  02/2010   MULTIPLE TOOTH EXTRACTIONS     SPINAL CORD STIMULATOR IMPLANT      Social History  reports that she has never smoked. She has never used smokeless tobacco. She reports previous alcohol use. She reports that she does not use drugs.  Allergies  Allergen Reactions   Imitrex [Sumatriptan] Nausea And Vomiting    Ringing in the ears, migraines are worse   Tetracycline Nausea Only    Causes ringing in ears, dizziness, migraine, nausea   Corticosteroids Other (See Comments)    12/02/2016 interview by GLO: Oral dosing causes migraines/nausea/tinnitus. Prev tolerated IV and intranasal admin w/o difficulty.   Cefixime Other (See Comments)    Headache    Ciprofloxacin Other (See Comments)    Headache, dizziness, ringing in the ears   Doxycycline Other (See Comments)    unknown   Gabapentin     Throat swells/closes   Isometheptene-Dichloral-Apap Other (See Comments)    unknown  Ketorolac     12/02/2016 interview by BMW: Oral dosing prednisone/corticosteroids causes migraines/nausea/tinnitus. Prev tolerated IV and intranasal admin w/o difficulty.   Prednisone Other (See Comments)    Migraine, dizzy, ringing in ears-can take it IV 12/02/2016 interview by [name]: Oral prednisone dosing causes migraines/nausea/tinnitus. Prev tolerated IV and intranasal admin w/o difficulty.   Promethazine Other (See Comments)    causes severe abdominal pain when taken IV; however she can take PO or IM   Stadol [Butorphanol]     Cerebral pain   Erythromycin Base Diarrhea and Itching    Severe diarrhea   Propoxyphene Nausea Only    Family History  Problem  Relation Age of Onset   Hypertension Mother    GER disease Mother    Dementia Mother    Osteoporosis Mother    Arthritis Mother    Bipolar disorder Mother    Parkinson's disease Father    Congestive Heart Failure Father    Pneumonia Father    Arthritis Father    Dementia Father    Depression Father    Asthma Sister    Depression Sister    Bipolar disorder Brother    Asthma Daughter    Colon cancer Neg Hx    Esophageal cancer Neg Hx    Stomach cancer Neg Hx    Rectal cancer Neg Hx     Prior to Admission medications   Medication Sig Start Date End Date Taking? Authorizing Provider  acetaminophen (TYLENOL) 500 MG tablet Take 1-2 tablets (500-1,000 mg total) by mouth every 8 (eight) hours as needed (Max dose 3000 mg in 24 hours). Patient taking differently: Take 500-1,000 mg by mouth every 8 (eight) hours as needed for moderate pain (Max dose 3000 mg in 24 hours). 06/11/20  Yes [name], NP  amLODipine (NORVASC) 10 MG tablet Take 1 tablet (10 mg total) by mouth daily. 01/23/21  Yes Shelly Coss, MD  Azelastine-Fluticasone 137-50 MCG/ACT SUSP ONE SPRAY EACH NOSTRIL TWICE Azarian Starace DAY AS NEEDED Patient taking differently: Place 1 spray into both nostrils 2 (two) times daily as needed (allergies). 10/01/20  Yes Ambs, Kathrine Cords, FNP  baclofen (LIORESAL) 10 MG tablet Take 10 mg by mouth daily as needed for muscle spasms. 06/03/20  Yes [provider]  Calcium Citrate 200 MG TABS Take 200 tablets by mouth daily. 12/02/16  Yes [provider]  cholecalciferol (VITAMIN D3) 25 MCG (1000 UNIT) tablet Take 1,000 Units by mouth daily.   Yes [provider]  cycloSPORINE (RESTASIS) 0.05 % ophthalmic emulsion Place 1 drop into both eyes 2 (two) times daily.   Yes [provider]  dexlansoprazole (DEXILANT) 60 MG capsule Take 1 capsule (60 mg total) by mouth daily. Crack open the capsule and pour into spoon prior to ingestion. PLEASE SCHEDULE AN OFFICE VISIT FOR  FURTHER REFILLS. Thank you Patient taking differently: Take 60 mg by mouth daily. Crack open the capsule and pour into spoon prior to ingestion 01/03/21  Yes Armbruster, Carlota Raspberry, MD  Erenumab-aooe (AIMOVIG, 140 MG DOSE,) 70 MG/ML SOAJ Inject 70 mg into the skin every 30 (thirty) days.   Yes [provider]  estradiol (ESTRACE) 0.1 MG/GM vaginal cream Place 1 Applicatorful vaginally 3 (three) times Varie Machamer week. 01/12/20  Yes [provider]  ferrous sulfate 325 (65 FE) MG tablet Take 650 mg by mouth daily with breakfast.   Yes [provider]  hydrOXYzine (VISTARIL) 25 MG capsule TAKE ONE CAPSULE BY MOUTH TWICE DAILY AS NEEDED Patient  taking differently: Take 25 mg by mouth 2 (two) times daily as needed for anxiety. 01/07/21  Yes Thayer Headings, PMHNP  ibuprofen (ADVIL) 200 MG tablet Take 400 mg by mouth every 6 (six) hours as needed for mild pain.   Yes [provider]  immune globulin, human, (GAMMAGARD S/D LESS IGA) 10 g injection Inject 2 g/kg into the vein every 21 ( twenty-one) days.   Yes [provider]  lamoTRIgine (LAMICTAL) 200 MG tablet TAKE 1 TABLET BY MOUTH DAILY Patient taking differently: Take 200 mg by mouth at bedtime. 01/01/21  Yes Thayer Headings, PMHNP  LASTACAFT 0.25 % SOLN Place 1 drop into both eyes daily. 01/19/19  Yes [provider]  levothyroxine (SYNTHROID) 75 MCG tablet Take one tablet by mouth once daily 30 minutes before breakfast on empty stomach. Patient taking differently: Take 75 mcg by mouth daily before breakfast. 08/21/20  Yes [name], NP  magnesium oxide (MAG-OX) 400 MG tablet Take 400 mg by mouth daily.    Yes [provider]  mirtazapine (REMERON) 30 MG tablet Take 1/2 tablet at bedtime for one week, then increase to 1 tablet at bedtime Patient taking differently: Take 30 mg by mouth at bedtime. 01/09/21  Yes Thayer Headings, PMHNP  oxybutynin (DITROPAN) 5 MG/5ML syrup Take 2.5 mLs (2.5 mg total)  by mouth 2 (two) times daily. 01/22/21  Yes Shelly Coss, MD  potassium chloride SA (KLOR-CON) 20 MEQ tablet TAKE TWO TABLETS BY MOUTH DAILY (MAY DISSOLVE IN WATER) Patient taking differently: Take 40 mEq by mouth daily. 06/11/20  Yes [name], NP  rOPINIRole (REQUIP) 1 MG tablet TAKE 1 TABLET BY MOUTH AT BEDTIME Patient taking differently: Take 1 mg by mouth at bedtime. 09/09/20  Yes Patel, Donika K, DO  saccharomyces boulardii (FLORASTOR) 250 MG capsule Take 1 capsule (250 mg total) by mouth 2 (two) times daily. 01/02/21  Yes Fargo, Amy E, NP  sucralfate (CARAFATE) 1 GM/10ML suspension Take 2 tsp by mouth three times daily as needed. Patient taking differently: Take 2 g by mouth 3 (three) times daily as needed (gerd). 08/07/20  Yes Ngetich, Dinah C, NP  tamsulosin (FLOMAX) 0.4 MG CAPS capsule Take 1 capsule (0.4 mg total) by mouth daily. 01/23/21  Yes Shelly Coss, MD  tiZANidine (ZANAFLEX) 4 MG tablet Take 1 tablet (4 mg total) by mouth every 6 (six) hours as needed for muscle spasms. 11/24/20  Yes Drenda Freeze, MD  torsemide (DEMADEX) 20 MG tablet TAKE 1 TABLET BY MOUTH DAILY Patient taking differently: Take 20 mg by mouth daily. 01/27/21  Yes [name], NP  triamcinolone cream (KENALOG) 0.1 % Apply 1 application topically 2 (two) times daily as needed (rash).   Yes [provider]  UNABLE TO FIND Take 1 tablet by mouth daily. Med Name: [name] 1 by mouth daily for UTI prevention   Yes [provider]  vancomycin (VANCOCIN) 125 MG capsule Take 1 capsule (125 mg total) by mouth 4 (four) times daily for 7 days. Patient taking differently: Take 125 mg by mouth 4 (four) times daily. Start date: 01/15/21 01/29/21 02/05/21 Yes Esterwood, Amy S, PA-C  ZINC OXIDE, TOPICAL, 10 % CREA Apply 1 application topically in the morning and at bedtime. Patient taking differently: Apply 1 application topically 2 (two) times daily as needed (rash). 12/06/20  Yes Ngetich, Nelda Bucks, NP    Physical Exam: Vitals:   02/04/21 1702 02/04/21 1803  BP: (!) 143/63   Pulse: 86  Resp: 18   Temp: 98.3 F (36.8 C)   TempSrc: Oral   SpO2: 100%   Weight:  55.3 kg  Height:  5\' 2"  (1.575 m)    Constitutional: NAD, calm, comfortable Vitals:   02/04/21 1702 02/04/21 1803  BP: (!) 143/63   Pulse: 86   Resp: 18   Temp: 98.3 F (36.8 C)   TempSrc: Oral   SpO2: 100%   Weight:  55.3 kg  Height:  5\' 2"  (1.575 m)   Eyes: PERRL, lids and conjunctivae normal ENMT: Mucous membranes are moist. Posterior pharynx clear of any exudate or lesions.Normal dentition.  Neck: normal, supple, no masses, no thyromegaly Respiratory: unlabored Cardiovascular: RRR, palpable pedal pulses Abdomen: mild lower abdomina discomfort - soft - non distended.  R flank pain over ribs.   Musculoskeletal: no clubbing / cyanosis. Good ROM, no contractures. Normal muscle tone.  Skin: no visible rashes, lesions, ulcers. No induration Neurologic: CN 2-12 grossly intact. Sensation intact.  Moving all extremities.  Psychiatric: Normal judgment and insight. Alert and oriented x 3. Normal mood.   Labs on Admission: I have personally reviewed following labs and imaging studies  CBC: Recent Labs  Lab 01/29/21 1205 02/04/21 1711  WBC 9.0 6.2  NEUTROABS 6.0 4.3  HGB 12.8 12.2  HCT 38.3 38.5  MCV 88.7 93.0  PLT 304.0 644    Basic Metabolic Panel: Recent Labs  Lab 01/29/21 1205  NA 136  K 4.3  CL 101  CO2 25  GLUCOSE 99  BUN 37*  CREATININE 1.12  CALCIUM 10.3    GFR: Estimated Creatinine Clearance: 35.9 mL/min (by C-G formula based on SCr of 1.12 mg/dL).  Liver Function Tests: No results for input(s): AST, ALT, ALKPHOS, BILITOT, PROT, ALBUMIN in the last 168 hours.  Urine analysis:    Component Value Date/Time   COLORURINE YELLOW 01/14/2021 1547   APPEARANCEUR CLEAR 01/14/2021 1547   LABSPEC 1.010 01/14/2021 1547   PHURINE 6.0 01/14/2021 1547   GLUCOSEU NEGATIVE 01/14/2021 1547    HGBUR SMALL (Perez Dirico) 01/14/2021 1547   BILIRUBINUR NEGATIVE 01/14/2021 1547   BILIRUBINUR N/Dashawn Bartnick 03/25/2020 1533   KETONESUR NEGATIVE 01/14/2021 1547   PROTEINUR NEGATIVE 01/14/2021 1547   UROBILINOGEN negative (Younes Degeorge) 03/25/2020 1533   NITRITE NEGATIVE 01/14/2021 1547   LEUKOCYTESUR NEGATIVE 01/14/2021 1547    Radiological Exams on Admission: DG CHEST PORT 1 VIEW  Result Date: 02/04/2021 CLINICAL DATA:  Weakness EXAM: PORTABLE CHEST 1 VIEW COMPARISON:  01/14/2021 FINDINGS: Cardiac shadow is stable. Spinal stimulators are noted. Left chest wall port is again seen and stable. Postsurgical changes are noted in the right lung and right ribcage. Lungs are clear. No bony abnormality is noted. IMPRESSION: No acute abnormality noted. Electronically Signed   By: Inez Catalina M.D.   On: 02/04/2021 18:06    EKG: Independently reviewed. pending  Assessment/Plan Active Problems:   Rigors  Rigors in the setting of recently treated gram negative bacteremia  Concern for recurrent gram negative bacteremia Immunodeficiency  CXR negative  Recently completed therapy for gram negative bacteremia on 6/15 with cefepime Symptoms began 3-4 days ago (after stopping abx) Picture concerning for inadequately treated or recurrent bacteremia Blood cultures x2 pending Will attempt to collect 1 blood culture off port Follow COVID testing Follow UA and culture Restart cefepime after cultures collected  C diff Diagnosed 01/14/21 Continue vancomycin while on high risk abx Mild lower abdominal discomfort, suspect maybe related to c diff  History of Hypogammaglobinemia Receives gammagard q3 weeks Last received  this Friday Follows with Dr. Marcelline Deist at [facility]  Right Flank Pain Over ribs, CXR without noted abnormality Follow LFT's Consider abdominal imaging if persistent  Hypertension Amlodipine  Hx Migraines Aimovig q 30 days Takes baclofen for this as well  Recent Enterococcus UTI Was asymptomatic, but  treated with zosyn at last hospitalization  Hypothyroidism Synthroid  Dry Eyes Continue eyedrops  Bipolar  Lamictal Remeron   Restless leg requip  Lower Extremity Swelling Torsemide on hold for now  Overactive Bladder Oxybutynin  Urinary Retention Noted last admission, continue flomax  Hx Gastric Bypass  DVT prophylaxis: lovenox  Code Status:   full  Family Communication:  Husband at bedside  Disposition Plan:   Patient is from:  home  Anticipated DC to:  home  Anticipated DC date:  3-4 days  Anticipated DC barriers: Pending improvement  Consults called:  Plan for ID  Admission status:  inpatient   Severity of Illness: The appropriate patient status for this patient is INPATIENT. Inpatient status is judged to be reasonable and necessary in order to provide the required intensity of service to ensure the patient's safety. The patient's presenting symptoms, physical exam findings, and initial radiographic and laboratory data in the context of their chronic comorbidities is felt to place them at high risk for further clinical deterioration. Furthermore, it is not anticipated that the patient will be medically stable for discharge from the hospital within 2 midnights of admission. The following factors support the patient status of inpatient.   " The patient's presenting symptoms include rigors. " The initial radiographic and laboratory data are worrisome because of recent positive blood cultures. " The chronic co-morbidities include immune deficiency .   * I certify that at the point of admission it is my clinical judgment that the patient will require inpatient hospital care spanning beyond 2 midnights from the point of admission due to high intensity of service, high risk for further deterioration and high frequency of surveillance required.Fayrene Helper MD [facility]  How to contact the [facility] Attending or Consulting provider [place] or covering  provider during after hours [place], for this patient?   Check the care team in [facility] and look for Jenesa Foresta) attending/consulting [facility] provider listed and b) the [facility] team listed Log into [email or web address] and use [place] universal password to access. If you do not have the password, please contact the hospital operator. Locate the [facility] provider you are looking for under [facility] and page to Lidia Clavijo number that you can be directly reached. If you still have difficulty reaching the provider, please page the [facility] (Director on Call) for the Hospitalists listed on amion for assistance.  02/04/2021, 6:25 PM

## 2021-02-05 ENCOUNTER — Inpatient Hospital Stay (HOSPITAL_COMMUNITY): Payer: Medicare PPO

## 2021-02-05 DIAGNOSIS — R7881 Bacteremia: Secondary | ICD-10-CM

## 2021-02-05 DIAGNOSIS — L0291 Cutaneous abscess, unspecified: Secondary | ICD-10-CM

## 2021-02-05 DIAGNOSIS — R197 Diarrhea, unspecified: Secondary | ICD-10-CM

## 2021-02-05 LAB — COMPREHENSIVE METABOLIC PANEL
ALT: 31 U/L (ref 0–44)
AST: 36 U/L (ref 15–41)
Albumin: 2.9 g/dL — ABNORMAL LOW (ref 3.5–5.0)
Alkaline Phosphatase: 66 U/L (ref 38–126)
Anion gap: 7 (ref 5–15)
BUN: 28 mg/dL — ABNORMAL HIGH (ref 8–23)
CO2: 25 mmol/L (ref 22–32)
Calcium: 7.9 mg/dL — ABNORMAL LOW (ref 8.9–10.3)
Chloride: 104 mmol/L (ref 98–111)
Creatinine, Ser: 1.13 mg/dL — ABNORMAL HIGH (ref 0.44–1.00)
GFR, Estimated: 52 mL/min — ABNORMAL LOW (ref >60)
Glucose, Bld: 149 mg/dL — ABNORMAL HIGH (ref 70–99)
Potassium: 3.8 mmol/L (ref 3.5–5.1)
Sodium: 136 mmol/L (ref 135–145)
Total Bilirubin: 0.4 mg/dL (ref 0.3–1.2)
Total Protein: 6.5 g/dL (ref 6.5–8.1)

## 2021-02-05 LAB — URINALYSIS, ROUTINE W REFLEX MICROSCOPIC
Bilirubin Urine: NEGATIVE
Glucose, UA: NEGATIVE mg/dL
Ketones, ur: NEGATIVE mg/dL
Nitrite: NEGATIVE
Protein, ur: NEGATIVE mg/dL
Specific Gravity, Urine: 1.014 (ref 1.005–1.030)
pH: 7 (ref 5.0–8.0)

## 2021-02-05 LAB — BLOOD CULTURE ID PANEL (REFLEXED) - BCID2

## 2021-02-05 LAB — ECHOCARDIOGRAM COMPLETE
Area-P 1/2: 3.54 cm2
Height: 62 in
S' Lateral: 2.4 cm
Weight: 1983.43 oz

## 2021-02-05 LAB — PROCALCITONIN: Procalcitonin: <0.1 ng/mL

## 2021-02-05 MED ORDER — SODIUM CHLORIDE 0.9 % IV SOLN
1.0000 g | Freq: Two times a day (BID) | INTRAVENOUS | Status: DC
  Administered 2021-02-05 – 2021-02-14 (×18): 1 g via INTRAVENOUS
  Filled 2021-02-05 (×18): qty 1

## 2021-02-05 MED ORDER — TRIAMCINOLONE ACETONIDE 0.1 % EX CREA
TOPICAL_CREAM | Freq: Two times a day (BID) | CUTANEOUS | Status: AC
  Filled 2021-02-05: qty 15

## 2021-02-05 MED ORDER — ONDANSETRON HCL 4 MG/2ML IJ SOLN
4.0000 mg | Freq: Four times a day (QID) | INTRAMUSCULAR | Status: DC | PRN
  Administered 2021-02-05 – 2021-02-14 (×17): 4 mg via INTRAVENOUS
  Filled 2021-02-05 (×18): qty 2

## 2021-02-05 MED ORDER — ZINC OXIDE 11.3 % EX CREA
TOPICAL_CREAM | Freq: Two times a day (BID) | CUTANEOUS | Status: DC
  Administered 2021-02-06 – 2021-02-07 (×3): 1 via TOPICAL
  Filled 2021-02-05: qty 56

## 2021-02-05 NOTE — Progress Notes (Signed)
PROGRESS NOTE  Megan Brewer ZJI:[id number] DOB: [birth year] DOA: 02/04/2021 PCP: [name], NP   LOS: 1 day   Brief Narrative / Interim history: 75 year old female with bipolar disorder, lung cancer, C. difficile, hypogammaglobulinemia, hypothyroidism, gastric bypass comes in as a direct admission from the ID clinic with rigors, malaise.  She was recently hospitalized and treated for gram-negative bacteremia and C. difficile infection.  She finished 14 days of cefepime on 6/15, and following that over the course of the last several days she started feeling poorly, fatigue, rigors, generalized weakness and came back to the hospital.  Her diarrhea was better on oral vancomycin however got worse just prior to admission.  Subjective / 24h Interval events: Continues to feel poorly, feels like her diarrhea is getting worse once IV antibiotics were started  Assessment & Plan: Principal Problem Concern for persistent bacteremia-recently with gram-negative's.  ID consulted and following, she apparently grew Herbspirillum huttience.  Continue meropenem, oral vancomycin per ID -2D echo pending, port site ultrasound pending  Active Problems C. difficile diarrhea-continue oral vancomycin  History of hypogammaglobinemia-receives Gammagard every 3 weeks, last dose 6/17  Recent Enterococcus UTI-treated  Hypothyroidism-continue Synthroid  Overactive bladder-continue home medications  Urinary retention-continue Flomax  History of gastric bypass-noted  Essential hypertension-continue Norvasc   Scheduled Meds:  amLODipine  10 mg Oral Daily   calcium citrate  200 mg of elemental calcium Oral Daily   Chlorhexidine Gluconate Cloth  6 each Topical Daily   cholecalciferol  1,000 Units Oral Daily   cycloSPORINE  1 drop Both Eyes BID   enoxaparin (LOVENOX) injection  40 mg Subcutaneous Q24H   estradiol  1 Applicatorful Vaginal Once per day on Mon Wed Fri   ferrous sulfate  650 mg Oral  Q breakfast   ketotifen  1 drop Both Eyes Daily   lamoTRIgine  200 mg Oral QHS   levothyroxine  75 mcg Oral Q0600   magnesium oxide  400 mg Oral Daily   mirtazapine  30 mg Oral QHS   oxybutynin  2.5 mg Oral BID   pantoprazole  40 mg Oral Daily   potassium chloride SA  40 mEq Oral Daily   rOPINIRole  1 mg Oral QHS   saccharomyces boulardii  250 mg Oral BID   sodium chloride flush  10-40 mL Intracatheter Q12H   tamsulosin  0.4 mg Oral Daily   triamcinolone cream   Topical BID   vancomycin  125 mg Oral QID   Continuous Infusions:  lactated ringers 75 mL/hr at 02/04/21 1916   meropenem (MERREM) IV     PRN Meds:.acetaminophen **OR** acetaminophen, azelastine **AND** fluticasone, baclofen, hydrOXYzine, ondansetron (ZOFRAN) IV, oxyCODONE, polyethylene glycol, sodium chloride flush, sucralfate, tiZANidine  Diet Orders (From admission, onward)     Start     Ordered   02/04/21 1705  Diet regular Room service appropriate? Yes; Fluid consistency: Thin  Diet effective now       Question Answer Comment  Room service appropriate? Yes   Fluid consistency: Thin      02/04/21 1704            DVT prophylaxis: enoxaparin (LOVENOX) injection 40 mg Start: 02/04/21 2200     Code Status: Full Code  Family Communication: No family at bedside  Status is: Inpatient  Remains inpatient appropriate because:Inpatient level of care appropriate due to severity of illness  Dispo: The patient is from: Home              Anticipated d/c  is to: Home              Patient currently is not medically stable to d/c.   Difficult to place patient No   Level of care: Telemetry  Consultants:  Infectious disease  Procedures:  2D echo: pending  Microbiology  Blood cultures - pending  Antimicrobials: Meropenem    Objective: Vitals:   02/04/21 1702 02/04/21 1803 02/04/21 2018 02/05/21 0500  BP: (!) 143/63  114/60   Pulse: 86  73   Resp: 18  18   Temp: 98.3 F (36.8 C)  97.8 F (36.6 C)    TempSrc: Oral  Axillary   SpO2: 100%     Weight:  55.3 kg  56.2 kg  Height:  5\' 2"  (1.575 m)      Intake/Output Summary (Last 24 hours) at 02/05/2021 1256 Last data filed at 02/05/2021 0300 Gross per 24 hour  Intake 1120 ml  Output 400 ml  Net 720 ml   Filed Weights   02/04/21 1803 02/05/21 0500  Weight: 55.3 kg 56.2 kg    Examination:  Constitutional: NAD Eyes: no scleral icterus ENMT: Mucous membranes are moist.  Neck: normal, supple Respiratory: clear to auscultation bilaterally, no wheezing, no crackles. Normal respiratory effort. No accessory muscle use.  Cardiovascular: Regular rate and rhythm, no murmurs / rubs / gallops. No LE edema.  Abdomen: non distended, no tenderness. Bowel sounds positive.  Musculoskeletal: no clubbing / cyanosis.  Skin: no rashes Neurologic: CN 2-12 grossly intact. Strength 5/5 in all 4.  Data Reviewed: I have independently reviewed following labs and imaging studies   CBC: Recent Labs  Lab 02/04/21 1711  WBC 6.2  NEUTROABS 4.3  HGB 12.2  HCT 38.5  MCV 93.0  PLT 778   Basic Metabolic Panel: Recent Labs  Lab 02/04/21 1711 02/05/21 0310  NA 138 136  K 5.4* 3.8  CL 103 104  CO2 28 25  GLUCOSE 93 149*  BUN 34* 28*  CREATININE 1.11* 1.13*  CALCIUM 8.7* 7.9*   Liver Function Tests: Recent Labs  Lab 02/04/21 1711 02/05/21 0310  AST 35 36  ALT 32 31  ALKPHOS 78 66  BILITOT 0.3 0.4  PROT 8.1 6.5  ALBUMIN 3.6 2.9*   Coagulation Profile: No results for input(s): INR, PROTIME in the last 168 hours. HbA1C: No results for input(s): HGBA1C in the last 72 hours. CBG: No results for input(s): GLUCAP in the last 168 hours.  Recent Results (from the past 240 hour(s))  Culture, blood (routine x 2)     Status: None (Preliminary result)   Collection Time: 02/04/21  5:12 PM   Specimen: BLOOD  Result Value Ref Range Status   Specimen Description   Final    BLOOD RIGHT ARM Performed at [facility]  [address] [place], [place] [address]    Special Requests   Final    BOTTLES DRAWN AEROBIC AND ANAEROBIC Blood Culture adequate volume Performed at [facility] [address], [place], [place] [address]    Culture   Final    NO GROWTH < 12 HOURS Performed at [facility] [address], [place], [place] [address]    Report Status PENDING  Incomplete  Culture, blood (routine x 2)     Status: None (Preliminary result)   Collection Time: 02/04/21  5:12 PM   Specimen: BLOOD  Result Value Ref Range Status   Specimen Description   Final    BLOOD  LEFT ANTECUBITAL Performed at [facility] [address], [place], [name] [address]    Special Requests   Final    BOTTLES DRAWN AEROBIC ONLY Blood Culture adequate volume Performed at [facility] [address], [place], [place] [address]    Culture   Final    NO GROWTH < 12 HOURS Performed at [facility] [address], [place], [place] [address]    Report Status PENDING  Incomplete  Resp Panel by RT-PCR (Flu A&B, Covid) Nasopharyngeal Swab     Status: None   Collection Time: 02/04/21  5:56 PM   Specimen: Nasopharyngeal Swab; Nasopharyngeal(NP) swabs in vial transport medium  Result Value Ref Range Status   SARS Coronavirus 2 by RT PCR NEGATIVE NEGATIVE Final    Comment: (NOTE) SARS-CoV-2 target nucleic acids are NOT DETECTED.  The SARS-CoV-2 RNA is generally detectable in upper respiratory specimens during the acute phase of infection. The lowest concentration of SARS-CoV-2 viral copies this assay can detect is 138 copies/mL. A negative result does not preclude SARS-Cov-2 infection and should not be used as the sole basis for treatment or other patient management decisions. A negative result may occur with  improper specimen collection/handling, submission of specimen other than nasopharyngeal swab, presence of viral mutation(s) within the areas targeted by  this assay, and inadequate number of viral copies(<138 copies/mL). A negative result must be combined with clinical observations, patient history, and epidemiological information. The expected result is Negative.  Fact Sheet for Patients:  EntrepreneurPulse.com.au  Fact Sheet for Healthcare Providers:  IncredibleEmployment.be  This test is no t yet approved or cleared by the [place] FDA and  has been authorized for detection and/or diagnosis of SARS-CoV-2 by FDA under an Emergency Use Authorization (EUA). This EUA will remain  in effect (meaning this test can be used) for the duration of the COVID-19 declaration under Section 564(b)(1) of the Act, 21 U.S.C.section 360bbb-3(b)(1), unless the authorization is terminated  or revoked sooner.       Influenza A by PCR NEGATIVE NEGATIVE Final   Influenza B by PCR NEGATIVE NEGATIVE Final    Comment: (NOTE) The Xpert Xpress SARS-CoV-2/FLU/RSV plus assay is intended as an aid in the diagnosis of influenza from Nasopharyngeal swab specimens and should not be used as a sole basis for treatment. Nasal washings and aspirates are unacceptable for Xpert Xpress SARS-CoV-2/FLU/RSV testing.  Fact Sheet for Patients: EntrepreneurPulse.com.au  Fact Sheet for Healthcare Providers: IncredibleEmployment.be  This test is not yet approved or cleared by the [place] FDA and has been authorized for detection and/or diagnosis of SARS-CoV-2 by FDA under an Emergency Use Authorization (EUA). This EUA will remain in effect (meaning this test can be used) for the duration of the COVID-19 declaration under Section 564(b)(1) of the Act, 21 U.S.C. section 360bbb-3(b)(1), unless the authorization is terminated or revoked.  Performed at [facility], [place] [address], [place], [place] [address]   Culture, blood (single)     Status: None (Preliminary result)    Collection Time: 02/04/21  6:37 PM   Specimen: BLOOD  Result Value Ref Range Status   Specimen Description   Final    BLOOD PORTA CATH LEFT CHEST Performed at [facility] [address] [place],  [address]    Special Requests   Final    BOTTLES DRAWN AEROBIC AND ANAEROBIC Blood Culture adequate volume Performed at [facility] [address] [place], [place]  [address]    Culture   Final    NO GROWTH < 12 HOURS Performed at [facility] [address], [place], [place] [address]    Report Status PENDING  Incomplete     Radiology Studies: DG CHEST PORT 1 VIEW  Result Date: 02/04/2021 CLINICAL DATA:  Weakness EXAM: PORTABLE CHEST 1 VIEW COMPARISON:  01/14/2021 FINDINGS: Cardiac shadow is stable. Spinal stimulators are noted. Left chest wall port is again seen and stable. Postsurgical changes are noted in the right lung and right ribcage. Lungs are clear. No bony abnormality is noted. IMPRESSION: No acute abnormality noted. Electronically Signed   By: Inez Catalina M.D.   On: 02/04/2021 18:06    Marzetta Board, MD, PhD [facility]  Between 7 am - 7 pm I am available, please contact me via Amion (for emergencies) or Securechat (non urgent messages)  Between 7 pm - 7 am I am not available, please contact night coverage MD/APP via Amion

## 2021-02-05 NOTE — Progress Notes (Signed)
PHARMACY - PHYSICIAN COMMUNICATION CRITICAL VALUE ALERT - BLOOD CULTURE IDENTIFICATION (BCID)  Megan Brewer is an 75 y.o. female who presented to [facility] on 02/04/2021 as direct admit from ID clinic with a chief complaint of weakness, rigors, general malaise.   Assessment: Recently admitted for gram negative bacteremia and C.difficile. Completed 14 day course of Cefepime for gram negative bacteremia on 01/29/21. Has implanted port in place. Cefepime resumed on admission, then switched to Meropenem today per ID recommendations. Continues on PO Vancomycin for C.difficile. Per ID note, ID pharmacist Dorothea Ogle spoke with [facility] today. Organism (from 01/14/21) is identified as Herbspirillum huttience. Sensitivities still pending and it might take 14 to 28 days to have the results back.  Blood cultures collected from port on 02/04/21 now growing gram negative rods-nothing detected on BCID, so anticipate this may be same organism as previous.   Name of physician (or Provider) Contacted: Gershon Cull, NP  Current antibiotics: IV Meropenem, PO Vancomycin  Changes to prescribed antibiotics recommended:  Continue current antibiotics. Continue to follow ID recommendations.   Results for orders placed or performed during the hospital encounter of 02/04/21  Blood Culture ID Panel (Reflexed) (Collected: 02/04/2021  6:37 PM)  Result Value Ref Range   Enterococcus faecalis NOT DETECTED NOT DETECTED   Enterococcus Faecium NOT DETECTED NOT DETECTED   Listeria monocytogenes NOT DETECTED NOT DETECTED   Staphylococcus species NOT DETECTED NOT DETECTED   Staphylococcus aureus (BCID) NOT DETECTED NOT DETECTED   Staphylococcus epidermidis NOT DETECTED NOT DETECTED   Staphylococcus lugdunensis NOT DETECTED NOT DETECTED   Streptococcus species NOT DETECTED NOT DETECTED   Streptococcus agalactiae NOT DETECTED NOT DETECTED   Streptococcus pneumoniae NOT DETECTED NOT DETECTED   Streptococcus pyogenes NOT  DETECTED NOT DETECTED   A.calcoaceticus-baumannii NOT DETECTED NOT DETECTED   Bacteroides fragilis NOT DETECTED NOT DETECTED   Enterobacterales NOT DETECTED NOT DETECTED   Enterobacter cloacae complex NOT DETECTED NOT DETECTED   Escherichia coli NOT DETECTED NOT DETECTED   Klebsiella aerogenes NOT DETECTED NOT DETECTED   Klebsiella oxytoca NOT DETECTED NOT DETECTED   Klebsiella pneumoniae NOT DETECTED NOT DETECTED   Proteus species NOT DETECTED NOT DETECTED   Salmonella species NOT DETECTED NOT DETECTED   Serratia marcescens NOT DETECTED NOT DETECTED   Haemophilus influenzae NOT DETECTED NOT DETECTED   Neisseria meningitidis NOT DETECTED NOT DETECTED   Pseudomonas aeruginosa NOT DETECTED NOT DETECTED   Stenotrophomonas maltophilia NOT DETECTED NOT DETECTED   Candida albicans NOT DETECTED NOT DETECTED   Candida auris NOT DETECTED NOT DETECTED   Candida glabrata NOT DETECTED NOT DETECTED   Candida krusei NOT DETECTED NOT DETECTED   Candida parapsilosis NOT DETECTED NOT DETECTED   Candida tropicalis NOT DETECTED NOT DETECTED   Cryptococcus neoformans/gattii NOT DETECTED NOT DETECTED    Luiz Ochoa 02/05/2021  9:48 PM

## 2021-02-05 NOTE — Progress Notes (Signed)
  Echocardiogram 2D Echocardiogram has been performed.  Darlina Sicilian M 02/05/2021, 3:03 PM

## 2021-02-05 NOTE — TOC Initial Note (Signed)
Transition of Care [facility]) - Initial/Assessment Note    Patient Details  Name: Megan Brewer MRN: [id number] Date of Birth: [birth year]  Transition of Care [facility]) CM/SW Contact:    Leeroy Cha, RN Phone Number: 02/05/2021, 9:20 AM  Clinical Narrative:                 75 y.o. female with medical history significant of bipolar disorder, lung cancer, c diff, hypogammaglobulinemia, hypothyroidism, hx gastric bypass, and mutliple other medical issues presents as a direct admission from the ID clinic with rigors and general malaise.   She was recently discharged from the hospital for gram negative bacteria and c diff infection.  She completed a 14 day course of cefepime on 6/15 and over the past 3-4 days she notes feeling more poorly.  She notes fatigue, rigors, generalized weakness, and legs feeling like rubber.  She notes her diarrhea is still present, though a little better.  She notes dyspnea with exertion and pounding in her head and chest when she exerts herself.  She notes sharp abdominal discomfort in her lower abdomen.  Denies dysuria.  No smoking or drinking.  She had rigors in ID clinic and due to her decline and concerning symptoms, Dr. Baxter Flattery sent her here for a direct admission. TOC PLAN OF CARE: Following for progression and toc needs.  None at this time. Expected Discharge Plan: Home/Self Care Barriers to Discharge: Continued Medical Work up   Patient Goals and CMS Choice Patient states their goals for this hospitalization and ongoing recovery are:: to go back home CMS Medicare.gov Compare Post Acute Care list provided to:: Patient    Expected Discharge Plan and Services Expected Discharge Plan: Home/Self Care   Discharge Planning Services: CM Consult   Living arrangements for the past 2 months: Single Family Home                                      Prior Living Arrangements/Services Living arrangements for the past 2 months: Single Family Home Lives  with:: Spouse Patient language and need for interpreter reviewed:: Yes Do you feel safe going back to the place where you live?: Yes      Need for Family Participation in Patient Care: Yes (Comment) (husband)     Criminal Activity/Legal Involvement Pertinent to Current Situation/Hospitalization: No - Comment as needed  Activities of Daily Living Home Assistive Devices/Equipment: Eyeglasses ADL Screening (condition at time of admission) Patient's cognitive ability adequate to safely complete daily activities?: Yes Is the patient deaf or have difficulty hearing?: No Does the patient have difficulty seeing, even when wearing glasses/contacts?: No Does the patient have difficulty concentrating, remembering, or making decisions?: Yes Patient able to express need for assistance with ADLs?: Yes Does the patient have difficulty dressing or bathing?: No Independently performs ADLs?: Yes (appropriate for developmental age) Does the patient have difficulty walking or climbing stairs?: Yes Weakness of Legs: Both Weakness of Arms/Hands: None  Permission Sought/Granted                  Emotional Assessment Appearance:: Appears stated age Attitude/Demeanor/Rapport: Engaged Affect (typically observed): Calm Orientation: : Oriented to Place, Oriented to Self, Oriented to  Time, Oriented to Situation Alcohol / Substance Use: Not Applicable Psych Involvement: No (comment)  Admission diagnosis:  Rigors [R68.89] Patient Active Problem List   Diagnosis Date Noted   Rigors 08/65/7846   Acute metabolic  encephalopathy 01/15/2021   SIRS (systemic inflammatory response syndrome) (HCC) 01/14/2021   Elevated troponin 01/14/2021   Hypokalemia 01/14/2021   Headache 01/14/2021   Chest discomfort 03/22/2019   Weight loss 01/27/2019   Acquired hypothyroidism 01/27/2019   Esophagitis 01/27/2019   Intractable migraine without status migrainosus 01/27/2019   Osteoporosis 01/27/2019   Chronic  sinusitis 12/28/2018   Food intolerance/GI symptoms 12/28/2018   Acute maxillary sinusitis 12/28/2018   Lower leg edema 12/20/2018   Chronic pain of both shoulders 05/24/2018   Bilateral leg edema 05/24/2018   Chronic pain syndrome 03/31/2018   Left bundle branch block 10/28/2017   Palpitations 10/28/2017   History of gastric bypass 05/21/2017   Polypharmacy 02/11/2017   Senile nuclear sclerosis 11/26/2016   Chronic post-thoracotomy pain 04/09/2014   Chronic kidney disease 04/09/2014   Sleep disturbances 09/20/2012   Restless legs syndrome 07/07/2012   Hypertonicity of bladder 05/23/2012   Essential tremor 04/01/2012   Memory loss 04/01/2012   Incomplete emptying of bladder 01/14/2012   Urge incontinence 10/28/2011   Tension type headache 06/10/2011   Mixed urge and stress incontinence 05/28/2011   Hypogammaglobulinemia ([place]) 06/22/2010   Severe episode of recurrent major depressive disorder, without psychotic features ([place]) 06/22/2010   Hypothyroidism 02/12/2010   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

## 2021-02-05 NOTE — Consult Note (Addendum)
[facility]                                                                                        Patient Identification: Patient Name: Megan Brewer MRN: [id number] [place] Date: 02/04/2021  4:43 PM Today's Date: 02/05/2021 Reason for consult: Gram-negative bacteremia Requesting provider: Laverna Peace   Active Problems:   Rigors   Antibiotics: Vancomycin 6/21-current                    cefepime 6/21-current  Lines/Tubes: Implanted port, neurostimulator.psh   Assessment Recent Gram Variable/Negative  Bacteremia ( 5/31)  Implanted port in place- denies any pain/tenderness/swelling   C Diff Diarrhea ( 2nd episode ) -frequency and consistency of loose stool was improving on PO Vancomycin after hospital discharge until yesterday but started to get worse from today with initiation  broad spectrum IV antibiotics  Hypogammaglobulinemia-on immunoglobulin  Recommendations  Will switch cefepime to meropenem for the time being while waiting for sensitivities. ID pharmacist Dorothea Ogle spoke with [place] today.  Organism is identified as Herbspirillum huttience.  Sensitivities still pending and it might take 14 to 28 days to have the results back.  Today is day 14 Continue vancomycin, pharmacy to dose Follow-up blood cultures from peripheral and port.  Blood cultures are negative in 48 to 72 hours, will DC vancomycin Ultrasound chest of left-sided port to look for underlying abscess TTE, she has a murmur Consider removal of port as there is a possibility for port infection Continue PO vancomycin for C diff diarrhea   Rest of the management as per the primary team. Please call with questions or concerns.  Thank you for the consult  Rosiland Oz, MD Infectious Disease Physician [facility] [address] [place], [place] [address] Phone:  [phone]  Fax: [phone]  __________________________________________________________________________________________________________ HPI and Hospital Course: 75 year old female with past medical history significant for bipolar disorder, lung cancer, C. Difficile, hypogammaglobulinemia, hypothyroidism, history of gastric bypass who was directly admitted on 6/21 from ID clinic by Dr. Baxter Flattery with concerns of rigors and general malaise.  Of note she was recently discharged from the hospital 6/8 for gram-negative/able bacteremia and C. Difficile.  She completed 14 days course of cefepime on 6/15.  She started feeling poorly in the last 2-3 days prior to admission with associated fatigue, rigors, generalized weakness.  She states the frequency and consistency of her stool was getting better on p.o. vancomycin.  However she had 8 episodes of watery bowel movements this morning after the initiation of broad-spectrum antibiotics.  She has some nausea, denies any vomiting, feels like somebody is punching on her abdomen.  Denies any urinary burning/hematuria or dysuria.  She denies any pain tenderness swelling in her port site and states the port has been there for 20 years.  She denies any chills while getting IV antibiotics from the port.   ROS: General- Denies fever,  loss of appetite and loss of weight HEENT - Denies headache, blurry vision, neck pain, sinus pain Chest - Denies any chest pain, SOB or cough CVS- Denies any  dizziness/lightheadedness, syncopal attacks, palpitations Abdomen- Denies any hematochezi Neuro - Denies any weakness, numbness, tingling sensation Psych - Denies any changes in mood irritability or depressive symptoms GU- Denies any burning, dysuria, hematuria or increased frequency of urination Skin - denies any rashes/lesions MSK - denies any joint pain/swelling or restricted ROM   Past Medical History:  Diagnosis Date   Anemia    Arachnoiditis    Bipolar disorder (HCC)     C. 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[place])    Lung cancer ([place]) 2011, 2014   Memory loss    Migraine    Opioid abuse ([place])    Osteoporosis    Presence of neurostimulator    Restless legs    SIRS (systemic inflammatory response syndrome) ([place])    Sleep apnea    Past Surgical History:  Procedure Laterality Date   ABDOMINAL HYSTERECTOMY     CARPAL TUNNEL RELEASE     CATARACT EXTRACTION Bilateral 2019   CHOLECYSTECTOMY     COLONOSCOPY  2015   [facility]   CT LUNG SCREENING  2018   DG  BONE DENSITY ([place] [facility])  2018   DIAGNOSTIC MAMMOGRAM  2019   GASTRIC BYPASS     LUNG CANCER SURGERY  02/2010   MULTIPLE TOOTH EXTRACTIONS     SPINAL CORD STIMULATOR IMPLANT       Scheduled Meds:  amLODipine  10 mg Oral Daily   calcium citrate  200 mg of elemental calcium Oral Daily   Chlorhexidine Gluconate Cloth  6 each Topical Daily   cholecalciferol  1,000 Units Oral Daily   cycloSPORINE  1 drop Both Eyes BID   enoxaparin (LOVENOX) injection  40 mg Subcutaneous Q24H   estradiol  1 Applicatorful Vaginal Once per day on Mon Wed Fri   ferrous sulfate  650 mg Oral Q breakfast   ketotifen  1 drop Both Eyes Daily   lamoTRIgine  200 mg Oral QHS   levothyroxine  75 mcg Oral Q0600   magnesium oxide  400 mg Oral Daily   mirtazapine  30 mg Oral QHS   oxybutynin  2.5 mg Oral BID   pantoprazole  40 mg Oral Daily   potassium chloride SA  40 mEq Oral Daily   rOPINIRole  1 mg Oral QHS   saccharomyces boulardii  250 mg Oral BID   sodium chloride flush  10-40 mL Intracatheter Q12H   tamsulosin  0.4 mg Oral Daily   triamcinolone cream   Topical BID   vancomycin  125 mg Oral QID   Continuous Infusions:  ceFEPime (MAXIPIME) IV 2 g (02/05/21 0800)    lactated ringers 75 mL/hr at 02/04/21 1916   PRN Meds:.acetaminophen **OR** acetaminophen, azelastine **AND** fluticasone, baclofen, hydrOXYzine, ondansetron (ZOFRAN) IV, oxyCODONE, polyethylene glycol, sodium chloride flush, sucralfate, tiZANidine  Allergies  Allergen Reactions   Imitrex [Sumatriptan] Nausea And Vomiting    Ringing in the ears, migraines are worse   Tetracycline Nausea Only    Causes ringing in ears, dizziness, migraine, nausea   Corticosteroids Other (See Comments)    12/02/2016 interview by DGU: Oral dosing causes migraines/nausea/tinnitus. Prev tolerated IV and intranasal admin w/o difficulty.   Cefixime Other (See Comments)    Headache  Ciprofloxacin Other (See Comments)    Headache, dizziness, ringing in the ears   Doxycycline Other (See Comments)    unknown   Gabapentin     Throat swells/closes   Isometheptene-Dichloral-Apap Other (See Comments)    unknown   Ketorolac     12/02/2016 interview by ACZ: Oral dosing prednisone/corticosteroids causes migraines/nausea/tinnitus. Prev tolerated IV and intranasal admin w/o difficulty.   Prednisone Other (See Comments)    Migraine, dizzy, ringing in ears-can take it IV 12/02/2016 interview by [name]: Oral prednisone dosing causes migraines/nausea/tinnitus. Prev tolerated IV and intranasal admin w/o difficulty.   Promethazine Other (See Comments)    causes severe abdominal pain when taken IV; however she can take PO or IM   Stadol [Butorphanol]     Cerebral pain   Erythromycin Base Diarrhea and Itching    Severe diarrhea   Propoxyphene Nausea Only   Social History   Socioeconomic History   Marital status: Married    Spouse name: Not on file   Number of children: 2   Years of education: Not on file   Highest education level: GED or equivalent  Occupational History   Occupation: retired  Tobacco Use   Smoking status: Never   Smokeless tobacco: Never  Vaping Use   Vaping Use: Never used  Substance and  Sexual Activity   Alcohol use: Not Currently   Drug use: Never   Sexual activity: Not on file  Other Topics Concern   Not on file  Social History Narrative   Diet: None      Caffeine: coffee, tea, sodas less than or 1 daily.      Married, if yes what year: Yes, 1972      Do you live in a house, apartment, assisted living, condo, trailer, ect: Two story house       Pets: None      Current/Past profession: Teach      Exercise: None         Living Will: No   DNR: No   POA/HPOA: No      Functional Status:   Do you have difficulty bathing or dressing yourself? yes   Do you have difficulty preparing food or eating? yes   Do you have difficulty managing your medications? yes   Do you have difficulty managing your finances?yes   Do you have difficulty affording your medications? no   Social Determinants of Health   Financial Resource Strain: Not on file  Food Insecurity: Not on file  Transportation Needs: Not on file  Physical Activity: Not on file  Stress: Not on file  Social Connections: Not on file  Intimate Partner Violence: Not on file    Vitals BP 114/60 (BP Location: Right Arm)   Pulse 73   Temp 97.8 F (36.6 C) (Axillary)   Resp 18   Ht 5\' 2"  (1.575 m)   Wt 56.2 kg   SpO2 100%   BMI 22.67 kg/m    Physical Exam Constitutional: Elderly white female, sitting up in bed, not in acute distress    Comments:   Cardiovascular:     Rate and Rhythm: Normal rate and regular rhythm.     Heart sounds: Systolic murmur  Pulmonary:     Effort: Pulmonary effort is normal.     Comments:   Abdominal:     Palpations: Abdomen is soft.     Tenderness: Nontender and nondistended  Musculoskeletal:        General: No swelling or tenderness.  Skin:    Comments: No lesions or rashes  Neurological:     General: No focal deficit present.   Psychiatric:        Mood and Affect: Mood normal.    Pertinent Microbiology Results for orders placed or performed during  the hospital encounter of 02/04/21  Culture, blood (routine x 2)     Status: None (Preliminary result)   Collection Time: 02/04/21  5:12 PM   Specimen: BLOOD  Result Value Ref Range Status   Specimen Description   Final    BLOOD RIGHT ARM Performed at [facility] [address], [place], [place] [address]    Special Requests   Final    BOTTLES DRAWN AEROBIC AND ANAEROBIC Blood Culture adequate volume Performed at [facility] [address] [place], [place] [address]    Culture   Final    NO GROWTH < 12 HOURS Performed at [facility] [address], [place], [place] [address]    Report Status PENDING  Incomplete  Culture, blood (routine x 2)     Status: None (Preliminary result)   Collection Time: 02/04/21  5:12 PM   Specimen: BLOOD  Result Value Ref Range Status   Specimen Description   Final    BLOOD LEFT ANTECUBITAL Performed at [facility] [address], [place], [place] [address]    Special Requests   Final    BOTTLES DRAWN AEROBIC ONLY Blood Culture adequate volume Performed at [facility] [address], [place], [place] [address]    Culture   Final    NO GROWTH < 12 HOURS Performed at [facility] [address], [place], [place] [address]    Report Status PENDING  Incomplete  Resp Panel by RT-PCR (Flu A&B, Covid) Nasopharyngeal Swab     Status: None   Collection Time: 02/04/21  5:56 PM   Specimen: Nasopharyngeal Swab; Nasopharyngeal(NP) swabs in vial transport medium  Result Value Ref Range Status   SARS Coronavirus 2 by RT PCR NEGATIVE NEGATIVE Final    Comment: (NOTE) SARS-CoV-2 target nucleic acids are NOT DETECTED.  The SARS-CoV-2 RNA is generally detectable in upper respiratory specimens during the acute phase of infection. The lowest concentration of SARS-CoV-2 viral copies this assay can detect is 138 copies/mL. A negative result does not preclude  SARS-Cov-2 infection and should not be used as the sole basis for treatment or other patient management decisions. A negative result may occur with  improper specimen collection/handling, submission of specimen other than nasopharyngeal swab, presence of viral mutation(s) within the areas targeted by this assay, and inadequate number of viral copies(<138 copies/mL). A negative result must be combined with clinical observations, patient history, and epidemiological information. The expected result is Negative.  Fact Sheet for Patients:  EntrepreneurPulse.com.au  Fact Sheet for Healthcare Providers:  IncredibleEmployment.be  This test is no t yet approved or cleared by the [place] FDA and  has been authorized for detection and/or diagnosis of SARS-CoV-2 by FDA under an Emergency Use Authorization (EUA). This EUA will remain  in effect (meaning this test can be used) for the duration of the COVID-19 declaration under Section 564(b)(1) of the Act, 21 U.S.C.section 360bbb-3(b)(1), unless the authorization is terminated  or revoked sooner.       Influenza A by PCR NEGATIVE NEGATIVE Final   Influenza B by PCR NEGATIVE NEGATIVE Final    Comment: (NOTE) The Xpert Xpress SARS-CoV-2/FLU/RSV plus assay  is intended as an aid in the diagnosis of influenza from Nasopharyngeal swab specimens and should not be used as a sole basis for treatment. Nasal washings and aspirates are unacceptable for Xpert Xpress SARS-CoV-2/FLU/RSV testing.  Fact Sheet for Patients: EntrepreneurPulse.com.au  Fact Sheet for Healthcare Providers: IncredibleEmployment.be  This test is not yet approved or cleared by the [place] FDA and has been authorized for detection and/or diagnosis of SARS-CoV-2 by FDA under an Emergency Use Authorization (EUA). This EUA will remain in effect (meaning this test can be used) for the duration of  the COVID-19 declaration under Section 564(b)(1) of the Act, 21 U.S.C. section 360bbb-3(b)(1), unless the authorization is terminated or revoked.  Performed at [facility], [place] [address] [place], [place] [address]   Culture, blood (single)     Status: None (Preliminary result)   Collection Time: 02/04/21  6:37 PM   Specimen: BLOOD  Result Value Ref Range Status   Specimen Description   Final    BLOOD PORTA CATH LEFT CHEST Performed at [facility] [address], [place], [place] [address]    Special Requests   Final    BOTTLES DRAWN AEROBIC AND ANAEROBIC Blood Culture adequate volume Performed at [facility] [address], [place], [place] [address]    Culture   Final    NO GROWTH < 12 HOURS Performed at [facility] [address], [place],  [address]    Report Status PENDING  Incomplete      Pertinent Lab seen by me: CBC Latest Ref Rng & Units 02/04/2021 01/29/2021 01/22/2021  WBC 4.0 - 10.5 K/uL 6.2 9.0 5.0  Hemoglobin 12.0 - 15.0 g/dL 12.2 12.8 10.7(L)  Hematocrit 36.0 - 46.0 % 38.5 38.3 32.7(L)  Platelets 150 - 400 K/uL 200 304.0 211   CMP Latest Ref Rng & Units 02/05/2021 02/04/2021 01/29/2021  Glucose 70 - 99 mg/dL 149(H) 93 99  BUN 8 - 23 mg/dL 28(H) 34(H) 37(H)  Creatinine 0.44 - 1.00 mg/dL 1.13(H) 1.11(H) 1.12  Sodium 135 - 145 mmol/L 136 138 136  Potassium 3.5 - 5.1 mmol/L 3.8 5.4(H) 4.3  Chloride 98 - 111 mmol/L 104 103 101  CO2 22 - 32 mmol/L 25 28 25   Calcium 8.9 - 10.3 mg/dL 7.9(L) 8.7(L) 10.3  Total Protein 6.5 - 8.1 g/dL 6.5 8.1 -  Total Bilirubin 0.3 - 1.2 mg/dL 0.4 0.3 -  Alkaline Phos 38 - 126 U/L 66 78 -  AST 15 - 41 U/L 36 35 -  ALT 0 - 44 U/L 31 32 -     Pertinent Imagings/Other Imagings Plain films and CT images have been personally visualized and interpreted; radiology reports have been reviewed. Decision making incorporated into the Impression /  Recommendations.  Chest Xray 02/04/21 FINDINGS: Cardiac shadow is stable. Spinal stimulators are noted. Left chest wall port is again seen and stable. Postsurgical changes are noted in the right lung and right ribcage. Lungs are clear. No bony abnormality is noted.   IMPRESSION: No acute abnormality noted.   I have spent more than 70  minutes for this patient encounter including review of prior medical records with greater than 50% of time being face to face and coordination of their care.  Electronically signed by:   Rosiland Oz, MD Infectious Disease Physician [facility] Pager: [phone]

## 2021-02-06 LAB — URINE CULTURE: Culture: NO GROWTH

## 2021-02-06 LAB — FERRITIN: Ferritin: 42 ng/mL (ref 11–307)

## 2021-02-06 LAB — IRON AND TIBC
Iron: 48 ug/dL (ref 28–170)
Saturation Ratios: 13 % (ref 10.4–31.8)
TIBC: 360 ug/dL (ref 250–450)
UIBC: 312 ug/dL

## 2021-02-06 LAB — VITAMIN B12: Vitamin B-12: 162 pg/mL — ABNORMAL LOW (ref 180–914)

## 2021-02-06 LAB — FOLATE: Folate: 7.5 ng/mL (ref >5.9)

## 2021-02-06 LAB — RETICULOCYTES
Immature Retic Fract: 6.4 % (ref 2.3–15.9)
RBC.: 3.67 MIL/uL — ABNORMAL LOW (ref 3.87–5.11)
Retic Count, Absolute: 44.8 10*3/uL (ref 19.0–186.0)
Retic Ct Pct: 1.2 % (ref 0.4–3.1)

## 2021-02-06 LAB — PROCALCITONIN: Procalcitonin: <0.1 ng/mL

## 2021-02-06 MED ORDER — PROMETHAZINE HCL 25 MG PO TABS
12.5000 mg | ORAL_TABLET | Freq: Four times a day (QID) | ORAL | Status: DC | PRN
  Administered 2021-02-06 – 2021-02-12 (×3): 12.5 mg via ORAL
  Filled 2021-02-06 (×3): qty 1

## 2021-02-06 NOTE — Progress Notes (Signed)
PROGRESS NOTE  Megan Brewer XKG:[id number] DOB: [birth year] DOA: 02/04/2021 PCP: [name], NP   LOS: 2 days   Brief Narrative / Interim history: 75 year old female with bipolar disorder, lung cancer, C. difficile, hypogammaglobulinemia, hypothyroidism, gastric bypass comes in as a direct admission from the ID clinic with rigors, malaise.  She was recently hospitalized and treated for gram-negative bacteremia and C. difficile infection.  She finished 14 days of cefepime on 6/15, and following that over the course of the last several days she started feeling poorly, fatigue, rigors, generalized weakness and came back to the hospital.  Her diarrhea was better on oral vancomycin however got worse just prior to admission.  Subjective / 24h Interval events: Complains of numbness into her feet and palms.  This is new.  Assessment & Plan: Principal Problem Concern for persistent bacteremia-recently with gram-negative's.  ID consulted and following, she apparently grew Herbspirillum huttience.  Continue meropenem, oral vancomycin per ID -2D echo without evidence of vegetations -Blood cultures obtained from port 6/22 are growing again gram-negative rods.  Port needs to be removed, surgery consulted today, appreciate input  Active Problems C. difficile diarrhea-continue oral vancomycin  History of hypogammaglobinemia-receives Gammagard every 3 weeks, last dose 6/17  Recent Enterococcus UTI-treated  Hypothyroidism-continue Synthroid  Overactive bladder-continue home medications  Urinary retention-continue Flomax  History of gastric bypass-noted  Essential hypertension-continue Norvasc   Scheduled Meds:  amLODipine  10 mg Oral Daily   calcium citrate  200 mg of elemental calcium Oral Daily   Chlorhexidine Gluconate Cloth  6 each Topical Daily   cholecalciferol  1,000 Units Oral Daily   cycloSPORINE  1 drop Both Eyes BID   enoxaparin (LOVENOX) injection  40 mg Subcutaneous  Q24H   estradiol  1 Applicatorful Vaginal Once per day on Mon Wed Fri   ferrous sulfate  650 mg Oral Q breakfast   ketotifen  1 drop Both Eyes Daily   lamoTRIgine  200 mg Oral QHS   levothyroxine  75 mcg Oral Q0600   magnesium oxide  400 mg Oral Daily   mirtazapine  30 mg Oral QHS   oxybutynin  2.5 mg Oral BID   pantoprazole  40 mg Oral Daily   potassium chloride SA  40 mEq Oral Daily   rOPINIRole  1 mg Oral QHS   saccharomyces boulardii  250 mg Oral BID   sodium chloride flush  10-40 mL Intracatheter Q12H   tamsulosin  0.4 mg Oral Daily   vancomycin  125 mg Oral QID   zinc oxide   Topical BID   Continuous Infusions:  meropenem (MERREM) IV 1 g (02/06/21 1009)   PRN Meds:.acetaminophen **OR** acetaminophen, azelastine **AND** fluticasone, baclofen, hydrOXYzine, ondansetron (ZOFRAN) IV, oxyCODONE, polyethylene glycol, sodium chloride flush, sucralfate, tiZANidine  Diet Orders (From admission, onward)     Start     Ordered   02/04/21 1705  Diet regular Room service appropriate? Yes; Fluid consistency: Thin  Diet effective now       Question Answer Comment  Room service appropriate? Yes   Fluid consistency: Thin      02/04/21 1704            DVT prophylaxis: enoxaparin (LOVENOX) injection 40 mg Start: 02/04/21 2200     Code Status: Full Code  Family Communication: No family at bedside  Status is: Inpatient  Remains inpatient appropriate because:Inpatient level of care appropriate due to severity of illness  Dispo: The patient is from: Home  Anticipated d/c is to: Home              Patient currently is not medically stable to d/c.   Difficult to place patient No   Level of care: Telemetry  Consultants:  Infectious disease  Procedures:  2D echo  1. Left ventricular ejection fraction, by estimation, is 60 to 65%. The left ventricle has normal function. The left ventricle has no regional wall motion abnormalities. Left ventricular diastolic  parameters are consistent with Grade I diastolic dysfunction (impaired relaxation).   2. Right ventricular systolic function is normal. The right ventricular size is normal. There is normal pulmonary artery systolic pressure.   3. The mitral valve is normal in structure. No evidence of mitral valve regurgitation. No evidence of mitral stenosis.   4. The aortic valve was not well visualized. Aortic valve regurgitation is trivial. No aortic stenosis is present.   5. The inferior vena cava is normal in size with greater than 50% respiratory variability, suggesting right atrial pressure of 3 mmHg.   Microbiology  Blood cultures -gram-negative rods from the port draw  Antimicrobials: Meropenem    Objective: Vitals:   02/05/21 0500 02/05/21 2031 02/06/21 0416 02/06/21 0500  BP:  (!) 142/66 (!) 148/73   Pulse:  81 78   Resp:  16 18   Temp:  98.1 F (36.7 C) 98.1 F (36.7 C)   TempSrc:  Oral Oral   SpO2:  99% 100%   Weight: 56.2 kg   54.7 kg  Height:        Intake/Output Summary (Last 24 hours) at 02/06/2021 1109 Last data filed at 02/06/2021 0300 Gross per 24 hour  Intake 100 ml  Output --  Net 100 ml    Filed Weights   02/04/21 1803 02/05/21 0500 02/06/21 0500  Weight: 55.3 kg 56.2 kg 54.7 kg    Examination:  Constitutional: No distress Eyes: No scleral icterus ENMT: mmm Neck: normal, supple Respiratory: Clear bilaterally, no wheezing Cardiovascular: Regular rate and rhythm, no murmurs, no edema Abdomen: Soft, NT, ND, bowel sounds positive Musculoskeletal: no clubbing / cyanosis.  Skin: No rashes seen Neurologic: Nonfocal  Data Reviewed: I have independently reviewed following labs and imaging studies   CBC: Recent Labs  Lab 02/04/21 1711  WBC 6.2  NEUTROABS 4.3  HGB 12.2  HCT 38.5  MCV 93.0  PLT 338    Basic Metabolic Panel: Recent Labs  Lab 02/04/21 1711 02/05/21 0310  NA 138 136  K 5.4* 3.8  CL 103 104  CO2 28 25  GLUCOSE 93 149*  BUN 34* 28*   CREATININE 1.11* 1.13*  CALCIUM 8.7* 7.9*    Liver Function Tests: Recent Labs  Lab 02/04/21 1711 02/05/21 0310  AST 35 36  ALT 32 31  ALKPHOS 78 66  BILITOT 0.3 0.4  PROT 8.1 6.5  ALBUMIN 3.6 2.9*    Coagulation Profile: No results for input(s): INR, PROTIME in the last 168 hours. HbA1C: No results for input(s): HGBA1C in the last 72 hours. CBG: No results for input(s): GLUCAP in the last 168 hours.  Recent Results (from the past 240 hour(s))  Culture, blood (routine x 2)     Status: None (Preliminary result)   Collection Time: 02/04/21  5:12 PM   Specimen: BLOOD  Result Value Ref Range Status   Specimen Description   Final    BLOOD RIGHT ARM Performed at [facility] [address], [place], [place] [address]    Special  Requests   Final    BOTTLES DRAWN AEROBIC AND ANAEROBIC Blood Culture adequate volume Performed at [facility] [address] [place], [place] [address]    Culture   Final    NO GROWTH 2 DAYS Performed at [facility] [address] [place], [place] [address]    Report Status PENDING  Incomplete  Culture, blood (routine x 2)     Status: None (Preliminary result)   Collection Time: 02/04/21  5:12 PM   Specimen: BLOOD  Result Value Ref Range Status   Specimen Description   Final    BLOOD LEFT ANTECUBITAL Performed at [facility] [address] [place], [place] [address]    Special Requests   Final    BOTTLES DRAWN AEROBIC ONLY Blood Culture adequate volume Performed at [facility] [address] [place], [place] [address]    Culture   Final    NO GROWTH 2 DAYS Performed at [facility] [address], [place], [place] [address]    Report Status PENDING  Incomplete  Resp Panel by RT-PCR (Flu A&B, Covid) Nasopharyngeal Swab     Status: None   Collection Time: 02/04/21  5:56 PM   Specimen: Nasopharyngeal Swab; Nasopharyngeal(NP) swabs in vial  transport medium  Result Value Ref Range Status   SARS Coronavirus 2 by RT PCR NEGATIVE NEGATIVE Final    Comment: (NOTE) SARS-CoV-2 target nucleic acids are NOT DETECTED.  The SARS-CoV-2 RNA is generally detectable in upper respiratory specimens during the acute phase of infection. The lowest concentration of SARS-CoV-2 viral copies this assay can detect is 138 copies/mL. A negative result does not preclude SARS-Cov-2 infection and should not be used as the sole basis for treatment or other patient management decisions. A negative result may occur with  improper specimen collection/handling, submission of specimen other than nasopharyngeal swab, presence of viral mutation(s) within the areas targeted by this assay, and inadequate number of viral copies(<138 copies/mL). A negative result must be combined with clinical observations, patient history, and epidemiological information. The expected result is Negative.  Fact Sheet for Patients:  EntrepreneurPulse.com.au  Fact Sheet for Healthcare Providers:  IncredibleEmployment.be  This test is no t yet approved or cleared by the [place] FDA and  has been authorized for detection and/or diagnosis of SARS-CoV-2 by FDA under an Emergency Use Authorization (EUA). This EUA will remain  in effect (meaning this test can be used) for the duration of the COVID-19 declaration under Section 564(b)(1) of the Act, 21 U.S.C.section 360bbb-3(b)(1), unless the authorization is terminated  or revoked sooner.       Influenza A by PCR NEGATIVE NEGATIVE Final   Influenza B by PCR NEGATIVE NEGATIVE Final    Comment: (NOTE) The Xpert Xpress SARS-CoV-2/FLU/RSV plus assay is intended as an aid in the diagnosis of influenza from Nasopharyngeal swab specimens and should not be used as a sole basis for treatment. Nasal washings and aspirates are unacceptable for Xpert Xpress SARS-CoV-2/FLU/RSV testing.  Fact  Sheet for Patients: EntrepreneurPulse.com.au  Fact Sheet for Healthcare Providers: IncredibleEmployment.be  This test is not yet approved or cleared by the [place] FDA and has been authorized for detection and/or diagnosis of SARS-CoV-2 by FDA under an Emergency Use Authorization (EUA). This EUA will remain in effect (meaning this test can be used) for the duration of the COVID-19 declaration under Section 564(b)(1) of the Act, 21 U.S.C. section 360bbb-3(b)(1), unless the authorization is terminated or  revoked.  Performed at [facility], [place] [address], [place], [place] [address]   Culture, blood (single)     Status: None (Preliminary result)   Collection Time: 02/04/21  6:37 PM   Specimen: BLOOD  Result Value Ref Range Status   Specimen Description   Final    BLOOD PORTA CATH LEFT CHEST Performed at [facility] [address] [place], [place] [address]    Special Requests   Final    BOTTLES DRAWN AEROBIC AND ANAEROBIC Blood Culture adequate volume Performed at [facility] [address], [place], [place] [address]    Culture  Setup Time   Final    GRAM NEGATIVE RODS IN BOTH AEROBIC AND ANAEROBIC BOTTLES Organism ID to follow CRITICAL RESULT CALLED TO, READ BACK BY AND VERIFIED WITHMelodye Ped [facility] 6144 02/05/21 A BROWNING    Culture   Final    CULTURE REINCUBATED FOR BETTER GROWTH Performed at [facility], [place] [address], [place], [place] [address]    Report Status PENDING  Incomplete  Blood Culture ID Panel (Reflexed)     Status: None   Collection Time: 02/04/21  6:37 PM  Result Value Ref Range Status   Enterococcus faecalis NOT DETECTED NOT DETECTED Final   Enterococcus Faecium NOT DETECTED NOT DETECTED Final   Listeria monocytogenes NOT DETECTED NOT DETECTED Final   Staphylococcus species NOT DETECTED NOT DETECTED Final   Staphylococcus aureus (BCID) NOT  DETECTED NOT DETECTED Final   Staphylococcus epidermidis NOT DETECTED NOT DETECTED Final   Staphylococcus lugdunensis NOT DETECTED NOT DETECTED Final   Streptococcus species NOT DETECTED NOT DETECTED Final   Streptococcus agalactiae NOT DETECTED NOT DETECTED Final   Streptococcus pneumoniae NOT DETECTED NOT DETECTED Final   Streptococcus pyogenes NOT DETECTED NOT DETECTED Final   A.calcoaceticus-baumannii NOT DETECTED NOT DETECTED Final   Bacteroides fragilis NOT DETECTED NOT DETECTED Final   Enterobacterales NOT DETECTED NOT DETECTED Final   Enterobacter cloacae complex NOT DETECTED NOT DETECTED Final   Escherichia coli NOT DETECTED NOT DETECTED Final   Klebsiella aerogenes NOT DETECTED NOT DETECTED Final   Klebsiella oxytoca NOT DETECTED NOT DETECTED Final   Klebsiella pneumoniae NOT DETECTED NOT DETECTED Final   Proteus species NOT DETECTED NOT DETECTED Final   Salmonella species NOT DETECTED NOT DETECTED Final   Serratia marcescens NOT DETECTED NOT DETECTED Final   Haemophilus influenzae NOT DETECTED NOT DETECTED Final   Neisseria meningitidis NOT DETECTED NOT DETECTED Final   Pseudomonas aeruginosa NOT DETECTED NOT DETECTED Final   Stenotrophomonas maltophilia NOT DETECTED NOT DETECTED Final   Candida albicans NOT DETECTED NOT DETECTED Final   Candida auris NOT DETECTED NOT DETECTED Final   Candida glabrata NOT DETECTED NOT DETECTED Final   Candida krusei NOT DETECTED NOT DETECTED Final   Candida parapsilosis NOT DETECTED NOT DETECTED Final   Candida tropicalis NOT DETECTED NOT DETECTED Final   Cryptococcus neoformans/gattii NOT DETECTED NOT DETECTED Final    Comment: Performed at [facility], [address] [place], [place] [address]  Culture, Urine     Status: None   Collection Time: 02/05/21  2:00 AM   Specimen: Urine, Clean Catch  Result Value Ref Range Status   Specimen Description   Final    URINE, CLEAN CATCH Performed at [facility], [place]  [address], [place], [place] [address]    Special Requests   Final    NONE Performed at [facility], [place] [address] [place], [place] [address]    Culture   Final    NO GROWTH Performed at [facility], [place] [address], [place], [place] [address]    Report Status 02/06/2021 FINAL  Final      Radiology Studies: ECHOCARDIOGRAM COMPLETE  Result Date: 02/05/2021    ECHOCARDIOGRAM REPORT   Patient Name:   [facility] Date of Exam: 02/05/2021 Medical Rec #:  [id number]          Height:       62.0 in Accession #:    [id number]         Weight:       124.0 lb Date of Birth:  06/19/1946         BSA:          1.560 m Patient Age:    44 years           BP:           124/66 mmHg Patient Gender: F                  HR:           67 bpm. Exam Location:  Inpatient Procedure: 2D Echo, Color Doppler and Cardiac Doppler Indications:    Bacteremia R78.81  History:        Patient has prior history of Echocardiogram examinations, most                 recent 01/15/2021. Risk Factors:Sleep Apnea. Gram negative                 bacteria and c diff infection. Chronic kidney disease.                 Hypothyroid. GERD. Lung cancer.  Sonographer:    Darlina Sicilian RDCS Referring Phys: [id number] [facility]  Sonographer Comments: Technically challenging study due to limited acoustic windows, suboptimal parasternal window and suboptimal apical window. IMPRESSIONS  1. Left ventricular ejection fraction, by estimation, is 60 to 65%. The left ventricle has normal function. The left ventricle has no regional wall motion abnormalities. Left ventricular diastolic parameters are consistent with Grade I diastolic dysfunction (impaired relaxation).  2. Right ventricular systolic function is normal. The right ventricular size is normal. There is normal pulmonary artery systolic pressure.  3. The mitral valve is normal in structure. No evidence of mitral valve regurgitation. No evidence of mitral stenosis.  4. The  aortic valve was not well visualized. Aortic valve regurgitation is trivial. No aortic stenosis is present.  5. The inferior vena cava is normal in size with greater than 50% respiratory variability, suggesting right atrial pressure of 3 mmHg. FINDINGS  Left Ventricle: Left ventricular ejection fraction, by estimation, is 60 to 65%. The left ventricle has normal function. The left ventricle has no regional wall motion abnormalities. The left ventricular internal cavity size was normal in size. There is  no left ventricular hypertrophy. Left ventricular diastolic parameters are consistent with Grade I diastolic dysfunction (impaired relaxation). Normal left ventricular filling pressure. Right Ventricle: The right ventricular size is normal. No increase in right ventricular wall thickness. Right ventricular systolic function is normal. There is normal pulmonary artery systolic pressure. The tricuspid regurgitant velocity is 2.84 m/s, and  with an assumed right atrial pressure of 3 mmHg, the estimated right ventricular systolic pressure is 28.3 mmHg. Left Atrium: Left atrial size was normal in size. Right Atrium: Right atrial size was normal  in size. Pericardium: There is no evidence of pericardial effusion. Mitral Valve: The mitral valve is normal in structure. No evidence of mitral valve regurgitation. No evidence of mitral valve stenosis. Tricuspid Valve: The tricuspid valve is normal in structure. Tricuspid valve regurgitation is mild . No evidence of tricuspid stenosis. Aortic Valve: The aortic valve was not well visualized. Aortic valve regurgitation is trivial. No aortic stenosis is present. Pulmonic Valve: The pulmonic valve was normal in structure. Pulmonic valve regurgitation is not visualized. No evidence of pulmonic stenosis. Aorta: The aortic root is normal in size and structure. Venous: The inferior vena cava is normal in size with greater than 50% respiratory variability, suggesting right atrial pressure  of 3 mmHg. IAS/Shunts: No atrial level shunt detected by color flow Doppler.  LEFT VENTRICLE PLAX 2D LVIDd:         3.90 cm  Diastology LVIDs:         2.40 cm  LV e' medial:    6.27 cm/s LV PW:         0.90 cm  LV E/e' medial:  9.8 LV IVS:        0.70 cm  LV e' lateral:   6.11 cm/s LVOT diam:     1.50 cm  LV E/e' lateral: 10.0 LV SV:         29 LV SV Index:   19 LVOT Area:     1.77 cm  RIGHT VENTRICLE RV S prime:     11.80 cm/s TAPSE (M-mode): 2.0 cm LEFT ATRIUM             Index LA diam:        3.20 cm 2.05 cm/m LA Vol (A2C):   34.1 ml 21.86 ml/m LA Vol (A4C):   34.2 ml 21.92 ml/m LA Biplane Vol: 35.3 ml 22.63 ml/m  AORTIC VALVE LVOT Vmax:   83.90 cm/s LVOT Vmean:  62.300 cm/s LVOT VTI:    0.166 m  AORTA Ao Root diam: 2.40 cm MITRAL VALVE               TRICUSPID VALVE MV Area (PHT): 3.54 cm    TR Peak grad:   32.3 mmHg MV Decel Time: 214 msec    TR Vmax:        284.00 cm/s MV E velocity: 61.40 cm/s MV A velocity: 90.20 cm/s  SHUNTS MV E/A ratio:  0.68        Systemic VTI:  0.17 m                            Systemic Diam: 1.50 cm Skeet Latch MD Electronically signed by Skeet Latch MD Signature Date/Time: 02/05/2021/5:21:40 PM    Final    [place] CHEST SOFT TISSUE  Result Date: 02/05/2021 CLINICAL DATA:  History of left-sided chest port, possible underlying infection EXAM: ULTRASOUND OF CHEST SOFT TISSUES TECHNIQUE: Ultrasound examination of the chest wall soft tissues was performed in the area of clinical concern. COMPARISON:  Chest x-ray from the previous day. FINDINGS: Sonographic evaluation in the left chest wall adjacent to the known chest wall port shows the port reservoir and catheter to be within normal limits. No surrounding hypoechoic collection is identified to suggest port abscess. IMPRESSION: No evidence of port abscess. Electronically Signed   By: Inez Catalina M.D.   On: 02/05/2021 15:50    Marzetta Board, MD, PhD [facility]  Between 7 am - 7 pm I am available,  please contact  me via Amion (for emergencies) or Securechat (non urgent messages)  Between 7 pm - 7 am I am not available, please contact night coverage MD/APP via Amion

## 2021-02-06 NOTE — Consult Note (Signed)
Megan Brewer 01-11-1946  [id number].    Requesting MD: Dr. Cruzita Lederer Chief Complaint/Reason for Consult: Request for port removal  HPI: Megan Brewer is a 75 y.o. female with a hx of HTN, Bipolar disorder, Hypothyroidism, Hypogammaglobinemia on immunoglobulin q3 weeks (last received Friday) and followed by Dr. Harrietta Guardian at [facility] who we were asked to see for port removal.   She was recently discharged from the hospital for gram negative bacteria and c diff infection.  She completed a 14 day course of cefepime on 6/15.  She was discharged home on oral vancomycin.  She reports she felt fatigue, rigors and generalized weakness at home.  She followed-up with ID who recommended direct readmission for further work-up.  Patient with blood port a cath samples growing gram negative rods in both aerobic and anaerobic bottles. 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 They have requested port removal. She notes the port was originally placed by Dr. Rose Fillers in [facility] 15 years ago.   ROS: Review of Systems  All other systems reviewed and are negative.  Family History  Problem Relation Age of Onset   Hypertension Mother    GER disease Mother    Dementia Mother    Osteoporosis Mother    Arthritis Mother    Bipolar disorder Mother    Parkinson's disease Father    Congestive Heart Failure Father    Pneumonia Father    Arthritis Father    Dementia Father    Depression Father    Asthma Sister    Depression Sister    Bipolar disorder Brother    Asthma Daughter    Colon cancer Neg Hx    Esophageal cancer Neg Hx    Stomach cancer Neg Hx    Rectal cancer Neg Hx     Past Medical History:  Diagnosis Date   Anemia    Arachnoiditis    Bipolar disorder ([place])    C. 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[place])    Lung cancer ([place]) 2011, 2014   Memory loss    Migraine    Opioid abuse ([place])    Osteoporosis    Presence of neurostimulator    Restless legs    SIRS (systemic inflammatory response syndrome) ([place])    Sleep apnea     Past Surgical History:  Procedure Laterality Date   ABDOMINAL HYSTERECTOMY     CARPAL TUNNEL RELEASE     CATARACT EXTRACTION Bilateral 2019   CHOLECYSTECTOMY     COLONOSCOPY  2015   [facility]   CT LUNG SCREENING  2018   DG  BONE DENSITY ([place] [facility]X)  2018   DIAGNOSTIC MAMMOGRAM  2019   GASTRIC BYPASS     LUNG CANCER SURGERY  02/2010   MULTIPLE TOOTH EXTRACTIONS     SPINAL CORD STIMULATOR IMPLANT      Social History:  reports that she has never smoked. She has never used smokeless tobacco. She reports previous alcohol use. She reports that she does not use drugs.  Allergies:  Allergies  Allergen Reactions   Imitrex [Sumatriptan] Nausea And Vomiting  Ringing in the ears, migraines are worse   Tetracycline Nausea Only    Causes ringing in ears, dizziness, migraine, nausea   Corticosteroids Other (See Comments)    12/02/2016 interview by Melissa Montane: Oral dosing causes migraines/nausea/tinnitus. Prev tolerated IV and intranasal admin w/o difficulty.   Cefixime Other (See Comments)    Headache    Ciprofloxacin Other (See Comments)    Headache, dizziness, ringing in the ears   Doxycycline Other (See Comments)    unknown   Gabapentin     Throat swells/closes   Isometheptene-Dichloral-Apap Other (See Comments)    unknown   Ketorolac     12/02/2016 interview by GNF: Oral dosing prednisone/corticosteroids causes migraines/nausea/tinnitus. Prev tolerated IV and intranasal admin w/o difficulty.   Prednisone Other (See Comments)    Migraine, dizzy, ringing in  ears-can take it IV 12/02/2016 interview by [name]: Oral prednisone dosing causes migraines/nausea/tinnitus. Prev tolerated IV and intranasal admin w/o difficulty.   Promethazine Other (See Comments)    causes severe abdominal pain when taken IV; however she can take PO or IM   Stadol [Butorphanol]     Cerebral pain   Erythromycin Base Diarrhea and Itching    Severe diarrhea   Propoxyphene Nausea Only    Medications Prior to Admission  Medication Sig Dispense Refill   acetaminophen (TYLENOL) 500 MG tablet Take 1-2 tablets (500-1,000 mg total) by mouth every 8 (eight) hours as needed (Max dose 3000 mg in 24 hours). (Patient taking differently: Take 500-1,000 mg by mouth every 8 (eight) hours as needed for moderate pain (Max dose 3000 mg in 24 hours).)     amLODipine (NORVASC) 10 MG tablet Take 1 tablet (10 mg total) by mouth daily. 30 tablet 1   Azelastine-Fluticasone 137-50 MCG/ACT SUSP ONE SPRAY EACH NOSTRIL TWICE A DAY AS NEEDED (Patient taking differently: Place 1 spray into both nostrils 2 (two) times daily as needed (allergies).) 23 g 0   baclofen (LIORESAL) 10 MG tablet Take 10 mg by mouth daily as needed for muscle spasms.     Calcium Citrate 200 MG TABS Take 200 mg of elemental calcium by mouth daily.     cholecalciferol (VITAMIN D3) 25 MCG (1000 UNIT) tablet Take 1,000 Units by mouth daily.     cycloSPORINE (RESTASIS) 0.05 % ophthalmic emulsion Place 1 drop into both eyes 2 (two) times daily.     dexlansoprazole (DEXILANT) 60 MG capsule Take 1 capsule (60 mg total) by mouth daily. Crack open the capsule and pour into spoon prior to ingestion. PLEASE SCHEDULE AN OFFICE VISIT FOR FURTHER REFILLS. Thank you (Patient taking differently: Take 60 mg by mouth daily. Crack open the capsule and pour into spoon prior to ingestion) 30 capsule 0   Erenumab-aooe (AIMOVIG, 140 MG DOSE,) 70 MG/ML SOAJ Inject 70 mg into the skin every 30 (thirty) days.     estradiol (ESTRACE) 0.1 MG/GM vaginal cream  Place 1 Applicatorful vaginally 3 (three) times a week.     ferrous sulfate 325 (65 FE) MG tablet Take 650 mg by mouth daily with breakfast.     hydrOXYzine (VISTARIL) 25 MG capsule TAKE ONE CAPSULE BY MOUTH TWICE DAILY AS NEEDED (Patient taking differently: Take 25 mg by mouth 2 (two) times daily as needed for anxiety.) 30 capsule 0   ibuprofen (ADVIL) 200 MG tablet Take 400 mg by mouth every 6 (six) hours as needed for mild pain.     immune globulin, human, (GAMMAGARD S/D LESS IGA) 10 g injection Inject 2 g/kg  into the vein every 21 ( twenty-one) days.     lamoTRIgine (LAMICTAL) 200 MG tablet TAKE 1 TABLET BY MOUTH DAILY (Patient taking differently: Take 200 mg by mouth at bedtime.) 30 tablet 2   LASTACAFT 0.25 % SOLN Place 1 drop into both eyes daily.     levothyroxine (SYNTHROID) 75 MCG tablet Take one tablet by mouth once daily 30 minutes before breakfast on empty stomach. (Patient taking differently: Take 75 mcg by mouth daily before breakfast.) 90 tablet 1   magnesium oxide (MAG-OX) 400 MG tablet Take 400 mg by mouth daily.      mirtazapine (REMERON) 30 MG tablet Take 1/2 tablet at bedtime for one week, then increase to 1 tablet at bedtime (Patient taking differently: Take 30 mg by mouth at bedtime.) 30 tablet 2   oxybutynin (DITROPAN) 5 MG/5ML syrup Take 2.5 mLs (2.5 mg total) by mouth 2 (two) times daily. 120 mL 0   potassium chloride SA (KLOR-CON) 20 MEQ tablet TAKE TWO TABLETS BY MOUTH DAILY (MAY DISSOLVE IN WATER) (Patient taking differently: Take 40 mEq by mouth daily.) 180 tablet 1   rOPINIRole (REQUIP) 1 MG tablet TAKE 1 TABLET BY MOUTH AT BEDTIME (Patient taking differently: Take 1 mg by mouth at bedtime.) 90 tablet 0   saccharomyces boulardii (FLORASTOR) 250 MG capsule Take 1 capsule (250 mg total) by mouth 2 (two) times daily. 30 capsule 3   sucralfate (CARAFATE) 1 GM/10ML suspension Take 2 tsp by mouth three times daily as needed. (Patient taking differently: Take 2 g by mouth 3  (three) times daily as needed (gerd).) 420 mL 1   tamsulosin (FLOMAX) 0.4 MG CAPS capsule Take 1 capsule (0.4 mg total) by mouth daily. 30 capsule 1   tiZANidine (ZANAFLEX) 4 MG tablet Take 1 tablet (4 mg total) by mouth every 6 (six) hours as needed for muscle spasms. 10 tablet 0   torsemide (DEMADEX) 20 MG tablet TAKE 1 TABLET BY MOUTH DAILY (Patient taking differently: Take 20 mg by mouth daily.) 90 tablet 1   triamcinolone cream (KENALOG) 0.1 % Apply 1 application topically 2 (two) times daily as needed (rash).     UNABLE TO FIND Take 1 tablet by mouth daily. Med Name: [name] 1 by mouth daily for UTI prevention     [EXPIRED] vancomycin (VANCOCIN) 125 MG capsule Take 1 capsule (125 mg total) by mouth 4 (four) times daily for 7 days. (Patient taking differently: Take 125 mg by mouth 4 (four) times daily. Start date: 01/15/21) 28 capsule 0   ZINC OXIDE, TOPICAL, 10 % CREA Apply 1 application topically in the morning and at bedtime. (Patient taking differently: Apply 1 application topically 2 (two) times daily as needed (rash).) 113 g 3     Physical Exam: Blood pressure (!) 148/73, pulse 78, temperature 98.1 F (36.7 C), temperature source Oral, resp. rate 18, height 5\' 2"  (1.575 m), weight 54.7 kg, SpO2 100 %. General: pleasant, WD/WN white female who is laying in bed in NAD HEENT: head is normocephalic, atraumatic.  Sclera are noninjected.  PERRL.  Ears and nose without any masses or lesions.  Mouth is pink and moist. Dentition fair Heart: regular, rate, and rhythm.  Normal s1,s2. No obvious murmurs, gallops, or rubs noted.  Palpable  pedal pulses bilaterally  Lungs: CTAB, no wheezes, rhonchi, or rales noted.  Respiratory effort nonlabored. Port in left upper chest wall without surrounding skin changes Abd: Soft, NT/ND, +BS, no masses, hernias, or organomegaly MS: no BUE/BLE edema, calves  soft and nontender Skin: warm and dry with no masses, lesions, or rashes Psych: A&Ox4 with an appropriate  affect Neuro: cranial nerves grossly intact, moves all extremities, normal speech, though process intact, gait not assessed   Results for orders placed or performed during the hospital encounter of 02/04/21 (from the past 48 hour(s))  CBC with Differential/Platelet     Status: None   Collection Time: 02/04/21  5:11 PM  Result Value Ref Range   WBC 6.2 4.0 - 10.5 K/uL   RBC 4.14 3.87 - 5.11 MIL/uL   Hemoglobin 12.2 12.0 - 15.0 g/dL   HCT 38.5 36.0 - 46.0 %   MCV 93.0 80.0 - 100.0 fL   MCH 29.5 26.0 - 34.0 pg   MCHC 31.7 30.0 - 36.0 g/dL   RDW 13.6 11.5 - 15.5 %   Platelets 200 150 - 400 K/uL   nRBC 0.0 0.0 - 0.2 %   Neutrophils Relative % 70 %   Neutro Abs 4.3 1.7 - 7.7 K/uL   Lymphocytes Relative 18 %   Lymphs Abs 1.1 0.7 - 4.0 K/uL   Monocytes Relative 10 %   Monocytes Absolute 0.6 0.1 - 1.0 K/uL   Eosinophils Relative 0 %   Eosinophils Absolute 0.0 0.0 - 0.5 K/uL   Basophils Relative 1 %   Basophils Absolute 0.1 0.0 - 0.1 K/uL   Immature Granulocytes 1 %   Abs Immature Granulocytes 0.03 0.00 - 0.07 K/uL    Comment: Performed at [facility], [place] [address], [place], [place] [address]  Comprehensive metabolic panel     Status: Abnormal   Collection Time: 02/04/21  5:11 PM  Result Value Ref Range   Sodium 138 135 - 145 mmol/L   Potassium 5.4 (H) 3.5 - 5.1 mmol/L   Chloride 103 98 - 111 mmol/L   CO2 28 22 - 32 mmol/L   Glucose, Bld 93 70 - 99 mg/dL    Comment: Glucose reference range applies only to samples taken after fasting for at least 8 hours.   BUN 34 (H) 8 - 23 mg/dL   Creatinine, Ser 1.11 (H) 0.44 - 1.00 mg/dL   Calcium 8.7 (L) 8.9 - 10.3 mg/dL   Total Protein 8.1 6.5 - 8.1 g/dL   Albumin 3.6 3.5 - 5.0 g/dL   AST 35 15 - 41 U/L   ALT 32 0 - 44 U/L   Alkaline Phosphatase 78 38 - 126 U/L   Total Bilirubin 0.3 0.3 - 1.2 mg/dL   GFR, Estimated 53 (L) >60 mL/min    Comment: (NOTE) Calculated using the CKD-EPI Creatinine Equation (2021)     Anion gap 7 5 - 15    Comment: Performed at [facility], [place] [address] [place], [place] [address]  Procalcitonin - Baseline     Status: None   Collection Time: 02/04/21  5:11 PM  Result Value Ref Range   Procalcitonin <0.10 ng/mL    Comment:        Interpretation: PCT (Procalcitonin) <= 0.5 ng/mL: Systemic infection (sepsis) is not likely. Local bacterial infection is possible. (NOTE)       Sepsis PCT Algorithm           Lower Respiratory Tract                                      Infection PCT Algorithm    ----------------------------     ----------------------------  PCT < 0.25 ng/mL                PCT < 0.10 ng/mL          Strongly encourage             Strongly discourage   discontinuation of antibiotics    initiation of antibiotics    ----------------------------     -----------------------------       PCT 0.25 - 0.50 ng/mL            PCT 0.10 - 0.25 ng/mL               OR       >80% decrease in PCT            Discourage initiation of                                            antibiotics      Encourage discontinuation           of antibiotics    ----------------------------     -----------------------------         PCT >= 0.50 ng/mL              PCT 0.26 - 0.50 ng/mL               AND        <80% decrease in PCT             Encourage initiation of                                             antibiotics       Encourage continuation           of antibiotics    ----------------------------     -----------------------------        PCT >= 0.50 ng/mL                  PCT > 0.50 ng/mL               AND         increase in PCT                  Strongly encourage                                      initiation of antibiotics    Strongly encourage escalation           of antibiotics                                     -----------------------------                                           PCT <= 0.25 ng/mL  OR                                        > 80% decrease in PCT                                      Discontinue / Do not initiate                                             antibiotics  Performed at [place] [address], [place], [place] [address]   Culture, blood (routine x 2)     Status: None (Preliminary result)   Collection Time: 02/04/21  5:12 PM   Specimen: BLOOD  Result Value Ref Range   Specimen Description      BLOOD RIGHT ARM Performed at [place] [address] [place], [place] [address]    Special Requests      BOTTLES DRAWN AEROBIC AND ANAEROBIC Blood Culture adequate volume Performed at [place] [address], [place], [place] [address]    Culture      NO GROWTH 2 DAYS Performed at [facility], [place] [address], [place], [place] [address]    Report Status PENDING   Culture, blood (routine x 2)     Status: None (Preliminary result)   Collection Time: 02/04/21  5:12 PM   Specimen: BLOOD  Result Value Ref Range   Specimen Description      BLOOD LEFT ANTECUBITAL Performed at [facility], [place] [address] [place], [place] [address]    Special Requests      BOTTLES DRAWN AEROBIC ONLY Blood Culture adequate volume Performed at [facility] [place] [address], [place], [place] [address]    Culture      NO GROWTH 2 DAYS Performed at [facility], [place] [address] [place], [place] [address]    Report Status PENDING   Resp Panel by RT-PCR (Flu A&B, Covid) Nasopharyngeal Swab     Status: None   Collection Time: 02/04/21  5:56 PM   Specimen: Nasopharyngeal Swab; Nasopharyngeal(NP) swabs in vial transport medium  Result Value Ref Range   SARS Coronavirus 2 by RT PCR NEGATIVE NEGATIVE    Comment: (NOTE) SARS-CoV-2 target nucleic acids are NOT DETECTED.  The SARS-CoV-2 RNA is generally detectable in upper respiratory specimens during the  acute phase of infection. The lowest concentration of SARS-CoV-2 viral copies this assay can detect is 138 copies/mL. A negative result does not preclude SARS-Cov-2 infection and should not be used as the sole basis for treatment or other patient management decisions. A negative result may occur with  improper specimen collection/handling, submission of specimen other than nasopharyngeal swab, presence of viral mutation(s) within the areas targeted by this assay, and inadequate number of viral copies(<138 copies/mL). A negative result must be combined with clinical observations, patient history, and epidemiological information. The expected result is Negative.  Fact Sheet for Patients:  EntrepreneurPulse.com.au  Fact Sheet for Healthcare Providers:  IncredibleEmployment.be  This test is no t yet approved or cleared by the [place] FDA and  has been authorized for detection and/or diagnosis of  SARS-CoV-2 by FDA under an Emergency Use Authorization (EUA). This EUA will remain  in effect (meaning this test can be used) for the duration of the COVID-19 declaration under Section 564(b)(1) of the Act, 21 U.S.C.section 360bbb-3(b)(1), unless the authorization is terminated  or revoked sooner.       Influenza A by PCR NEGATIVE NEGATIVE   Influenza B by PCR NEGATIVE NEGATIVE    Comment: (NOTE) The Xpert Xpress SARS-CoV-2/FLU/RSV plus assay is intended as an aid in the diagnosis of influenza from Nasopharyngeal swab specimens and should not be used as a sole basis for treatment. Nasal washings and aspirates are unacceptable for Xpert Xpress SARS-CoV-2/FLU/RSV testing.  Fact Sheet for Patients: EntrepreneurPulse.com.au  Fact Sheet for Healthcare Providers: IncredibleEmployment.be  This test is not yet approved or cleared by the [place] FDA and has been authorized for detection and/or diagnosis of  SARS-CoV-2 by FDA under an Emergency Use Authorization (EUA). This EUA will remain in effect (meaning this test can be used) for the duration of the COVID-19 declaration under Section 564(b)(1) of the Act, 21 U.S.C. section 360bbb-3(b)(1), unless the authorization is terminated or revoked.  Performed at [facility], [place] [address], [place], [place] [address]   Culture, blood (single)     Status: None (Preliminary result)   Collection Time: 02/04/21  6:37 PM   Specimen: BLOOD  Result Value Ref Range   Specimen Description      BLOOD PORTA CATH LEFT CHEST Performed at [place] [address], [place], [place] [address]    Special Requests      BOTTLES DRAWN AEROBIC AND ANAEROBIC Blood Culture adequate volume Performed at [place] [address] [place], [place] [address]    Culture  Setup Time      GRAM NEGATIVE RODS IN BOTH AEROBIC AND ANAEROBIC BOTTLES Organism ID to follow CRITICAL RESULT CALLED TO, READ BACK BY AND VERIFIED WITHMelodye Ped [facility] 9233 02/05/21 A BROWNING    Culture      CULTURE REINCUBATED FOR BETTER GROWTH Performed at [facility], [place] [address], [place], [place] [address]    Report Status PENDING   Blood Culture ID Panel (Reflexed)     Status: None   Collection Time: 02/04/21  6:37 PM  Result Value Ref Range   Enterococcus faecalis NOT DETECTED NOT DETECTED   Enterococcus Faecium NOT DETECTED NOT DETECTED   Listeria monocytogenes NOT DETECTED NOT DETECTED   Staphylococcus species NOT DETECTED NOT DETECTED   Staphylococcus aureus (BCID) NOT DETECTED NOT DETECTED   Staphylococcus epidermidis NOT DETECTED NOT DETECTED   Staphylococcus lugdunensis NOT DETECTED NOT DETECTED   Streptococcus species NOT DETECTED NOT DETECTED   Streptococcus agalactiae NOT DETECTED NOT DETECTED   Streptococcus pneumoniae NOT DETECTED NOT DETECTED   Streptococcus pyogenes NOT DETECTED NOT DETECTED    A.calcoaceticus-baumannii NOT DETECTED NOT DETECTED   Bacteroides fragilis NOT DETECTED NOT DETECTED   Enterobacterales NOT DETECTED NOT DETECTED   Enterobacter cloacae complex NOT DETECTED NOT DETECTED   Escherichia coli NOT DETECTED NOT DETECTED   Klebsiella aerogenes NOT DETECTED NOT DETECTED   Klebsiella oxytoca NOT DETECTED NOT DETECTED   Klebsiella pneumoniae NOT DETECTED NOT DETECTED   Proteus species NOT DETECTED NOT DETECTED   Salmonella species NOT DETECTED NOT DETECTED   Serratia marcescens NOT DETECTED NOT DETECTED   Haemophilus influenzae NOT DETECTED NOT DETECTED   Neisseria meningitidis NOT DETECTED NOT DETECTED   Pseudomonas aeruginosa NOT DETECTED NOT  DETECTED   Stenotrophomonas maltophilia NOT DETECTED NOT DETECTED   Candida albicans NOT DETECTED NOT DETECTED   Candida auris NOT DETECTED NOT DETECTED   Candida glabrata NOT DETECTED NOT DETECTED   Candida krusei NOT DETECTED NOT DETECTED   Candida parapsilosis NOT DETECTED NOT DETECTED   Candida tropicalis NOT DETECTED NOT DETECTED   Cryptococcus neoformans/gattii NOT DETECTED NOT DETECTED    Comment: Performed at [facility], [address], [place], [place] [address]  Urinalysis, Routine w reflex microscopic     Status: Abnormal   Collection Time: 02/05/21  2:00 AM  Result Value Ref Range   Color, Urine YELLOW YELLOW   APPearance CLEAR CLEAR   Specific Gravity, Urine 1.014 1.005 - 1.030   pH 7.0 5.0 - 8.0   Glucose, UA NEGATIVE NEGATIVE mg/dL   Hgb urine dipstick SMALL (A) NEGATIVE   Bilirubin Urine NEGATIVE NEGATIVE   Ketones, ur NEGATIVE NEGATIVE mg/dL   Protein, ur NEGATIVE NEGATIVE mg/dL   Nitrite NEGATIVE NEGATIVE   Leukocytes,Ua TRACE (A) NEGATIVE   RBC / HPF 11-20 0 - 5 RBC/hpf   WBC, UA 0-5 0 - 5 WBC/hpf   Bacteria, UA RARE (A) NONE SEEN   Squamous Epithelial / LPF 0-5 0 - 5   Mucus PRESENT     Comment: Performed at [facility], [place] [address], [place], [place] [address]   Culture, Urine     Status: None   Collection Time: 02/05/21  2:00 AM   Specimen: Urine, Clean Catch  Result Value Ref Range   Specimen Description      URINE, CLEAN CATCH Performed at [facility], [place] [address], [place], [place] [address]    Special Requests      NONE Performed at [facility], [place] [address], [place], [place] [address]    Culture      NO GROWTH Performed at [facility], [place] [address], [place], [place] [address]    Report Status 02/06/2021 FINAL   Procalcitonin     Status: None   Collection Time: 02/05/21  3:10 AM  Result Value Ref Range   Procalcitonin <0.10 ng/mL    Comment:        Interpretation: PCT (Procalcitonin) <= 0.5 ng/mL: Systemic infection (sepsis) is not likely. Local bacterial infection is possible. (NOTE)       Sepsis PCT Algorithm           Lower Respiratory Tract                                      Infection PCT Algorithm    ----------------------------     ----------------------------         PCT < 0.25 ng/mL                PCT < 0.10 ng/mL          Strongly encourage             Strongly discourage   discontinuation of antibiotics    initiation of antibiotics    ----------------------------     -----------------------------       PCT 0.25 - 0.50 ng/mL            PCT 0.10 - 0.25 ng/mL               OR       >80%  decrease in PCT            Discourage initiation of                                            antibiotics      Encourage discontinuation           of antibiotics    ----------------------------     -----------------------------         PCT >= 0.50 ng/mL              PCT 0.26 - 0.50 ng/mL               AND        <80% decrease in PCT             Encourage initiation of                                             antibiotics       Encourage continuation           of antibiotics    ----------------------------     -----------------------------        PCT >= 0.50 ng/mL                   PCT > 0.50 ng/mL               AND         increase in PCT                  Strongly encourage                                      initiation of antibiotics    Strongly encourage escalation           of antibiotics                                     -----------------------------                                           PCT <= 0.25 ng/mL                                                 OR                                        > 80% decrease in PCT                                      Discontinue / Do not initiate  antibiotics  Performed at [facility], [place] [address], [place], [place] [address]   Comprehensive metabolic panel     Status: Abnormal   Collection Time: 02/05/21  3:10 AM  Result Value Ref Range   Sodium 136 135 - 145 mmol/L   Potassium 3.8 3.5 - 5.1 mmol/L    Comment: DELTA CHECK NOTED   Chloride 104 98 - 111 mmol/L   CO2 25 22 - 32 mmol/L   Glucose, Bld 149 (H) 70 - 99 mg/dL    Comment: Glucose reference range applies only to samples taken after fasting for at least 8 hours.   BUN 28 (H) 8 - 23 mg/dL   Creatinine, Ser 1.13 (H) 0.44 - 1.00 mg/dL   Calcium 7.9 (L) 8.9 - 10.3 mg/dL   Total Protein 6.5 6.5 - 8.1 g/dL   Albumin 2.9 (L) 3.5 - 5.0 g/dL   AST 36 15 - 41 U/L   ALT 31 0 - 44 U/L   Alkaline Phosphatase 66 38 - 126 U/L   Total Bilirubin 0.4 0.3 - 1.2 mg/dL   GFR, Estimated 52 (L) >60 mL/min    Comment: (NOTE) Calculated using the CKD-EPI Creatinine Equation (2021)    Anion gap 7 5 - 15    Comment: Performed at [facility], [place] [address], [place], [place] [address]  Procalcitonin     Status: None   Collection Time: 02/06/21  5:00 AM  Result Value Ref Range   Procalcitonin <0.10 ng/mL    Comment:        Interpretation: PCT (Procalcitonin) <= 0.5 ng/mL: Systemic infection (sepsis) is not likely. Local bacterial infection is possible. (NOTE)       Sepsis PCT  Algorithm           Lower Respiratory Tract                                      Infection PCT Algorithm    ----------------------------     ----------------------------         PCT < 0.25 ng/mL                PCT < 0.10 ng/mL          Strongly encourage             Strongly discourage   discontinuation of antibiotics    initiation of antibiotics    ----------------------------     -----------------------------       PCT 0.25 - 0.50 ng/mL            PCT 0.10 - 0.25 ng/mL               OR       >80% decrease in PCT            Discourage initiation of                                            antibiotics      Encourage discontinuation           of antibiotics    ----------------------------     -----------------------------         PCT >= 0.50 ng/mL              PCT  0.26 - 0.50 ng/mL               AND        <80% decrease in PCT             Encourage initiation of                                             antibiotics       Encourage continuation           of antibiotics    ----------------------------     -----------------------------        PCT >= 0.50 ng/mL                  PCT > 0.50 ng/mL               AND         increase in PCT                  Strongly encourage                                      initiation of antibiotics    Strongly encourage escalation           of antibiotics                                     -----------------------------                                           PCT <= 0.25 ng/mL                                                 OR                                        > 80% decrease in PCT                                      Discontinue / Do not initiate                                             antibiotics  Performed at [place] [address] [place], [place] [address]   Vitamin B12     Status: Abnormal   Collection Time: 02/06/21 10:20 AM  Result Value Ref Range   Vitamin B-12 162 (L) 180 - 914 pg/mL     Comment: (NOTE) This assay is not validated for testing neonatal or myeloproliferative syndrome specimens for Vitamin B12 levels. Performed at [facility], [place] [address] [place], [place] [address]  Folate     Status: None   Collection Time: 02/06/21 10:20 AM  Result Value Ref Range   Folate 7.5 >5.9 ng/mL    Comment: Performed at [facility], [place] [address] [place], [place] [address]  Iron and TIBC     Status: None   Collection Time: 02/06/21 10:20 AM  Result Value Ref Range   Iron 48 28 - 170 ug/dL   TIBC 360 250 - 450 ug/dL   Saturation Ratios 13 10.4 - 31.8 %   UIBC 312 ug/dL    Comment: Performed at [facility], [place] [address], [place], [place] [address]  Ferritin     Status: None   Collection Time: 02/06/21 10:20 AM  Result Value Ref Range   Ferritin 42 11 - 307 ng/mL    Comment: Performed at [facility], [place] [address], [place], [place] [address]  Reticulocytes     Status: Abnormal   Collection Time: 02/06/21 10:20 AM  Result Value Ref Range   Retic Ct Pct 1.2 0.4 - 3.1 %   RBC. 3.67 (L) 3.87 - 5.11 MIL/uL   Retic Count, Absolute 44.8 19.0 - 186.0 K/uL   Immature Retic Fract 6.4 2.3 - 15.9 %    Comment: Performed at [facility], [place] [address], [place], [place] [address]   DG CHEST PORT 1 VIEW  Result Date: 02/04/2021 CLINICAL DATA:  Weakness EXAM: PORTABLE CHEST 1 VIEW COMPARISON:  01/14/2021 FINDINGS: Cardiac shadow is stable. Spinal stimulators are noted. Left chest wall port is again seen and stable. Postsurgical changes are noted in the right lung and right ribcage. Lungs are clear. No bony abnormality is noted. IMPRESSION: No acute abnormality noted. Electronically Signed   By: Inez Catalina M.D.   On: 02/04/2021 18:06   ECHOCARDIOGRAM COMPLETE  Result Date: 02/05/2021    ECHOCARDIOGRAM REPORT   Patient Name:   [facility] Date of Exam: 02/05/2021 Medical Rec  #:  [id number]          Height:       62.0 in Accession #:    [id number]         Weight:       124.0 lb Date of Birth:  April 25, 1946         BSA:          1.560 m Patient Age:    27 years           BP:           124/66 mmHg Patient Gender: F                  HR:           67 bpm. Exam Location:  Inpatient Procedure: 2D Echo, Color Doppler and Cardiac Doppler Indications:    Bacteremia R78.81  History:        Patient has prior history of Echocardiogram examinations, most                 recent 01/15/2021. Risk Factors:Sleep Apnea. Gram negative                 bacteria and c diff infection. Chronic kidney disease.                 Hypothyroid. GERD. Lung cancer.  Sonographer:    Darlina Sicilian RDCS Referring Phys: [id number] [facility]  Sonographer Comments: Technically challenging study due to limited acoustic windows, suboptimal parasternal  window and suboptimal apical window. IMPRESSIONS  1. Left ventricular ejection fraction, by estimation, is 60 to 65%. The left ventricle has normal function. The left ventricle has no regional wall motion abnormalities. Left ventricular diastolic parameters are consistent with Grade I diastolic dysfunction (impaired relaxation).  2. Right ventricular systolic function is normal. The right ventricular size is normal. There is normal pulmonary artery systolic pressure.  3. The mitral valve is normal in structure. No evidence of mitral valve regurgitation. No evidence of mitral stenosis.  4. The aortic valve was not well visualized. Aortic valve regurgitation is trivial. No aortic stenosis is present.  5. The inferior vena cava is normal in size with greater than 50% respiratory variability, suggesting right atrial pressure of 3 mmHg. FINDINGS  Left Ventricle: Left ventricular ejection fraction, by estimation, is 60 to 65%. The left ventricle has normal function. The left ventricle has no regional wall motion abnormalities. The left ventricular internal cavity size was normal in  size. There is  no left ventricular hypertrophy. Left ventricular diastolic parameters are consistent with Grade I diastolic dysfunction (impaired relaxation). Normal left ventricular filling pressure. Right Ventricle: The right ventricular size is normal. No increase in right ventricular wall thickness. Right ventricular systolic function is normal. There is normal pulmonary artery systolic pressure. The tricuspid regurgitant velocity is 2.84 m/s, and  with an assumed right atrial pressure of 3 mmHg, the estimated right ventricular systolic pressure is 24.2 mmHg. Left Atrium: Left atrial size was normal in size. Right Atrium: Right atrial size was normal in size. Pericardium: There is no evidence of pericardial effusion. Mitral Valve: The mitral valve is normal in structure. No evidence of mitral valve regurgitation. No evidence of mitral valve stenosis. Tricuspid Valve: The tricuspid valve is normal in structure. Tricuspid valve regurgitation is mild . No evidence of tricuspid stenosis. Aortic Valve: The aortic valve was not well visualized. Aortic valve regurgitation is trivial. No aortic stenosis is present. Pulmonic Valve: The pulmonic valve was normal in structure. Pulmonic valve regurgitation is not visualized. No evidence of pulmonic stenosis. Aorta: The aortic root is normal in size and structure. Venous: The inferior vena cava is normal in size with greater than 50% respiratory variability, suggesting right atrial pressure of 3 mmHg. IAS/Shunts: No atrial level shunt detected by color flow Doppler.  LEFT VENTRICLE PLAX 2D LVIDd:         3.90 cm  Diastology LVIDs:         2.40 cm  LV e' medial:    6.27 cm/s LV PW:         0.90 cm  LV E/e' medial:  9.8 LV IVS:        0.70 cm  LV e' lateral:   6.11 cm/s LVOT diam:     1.50 cm  LV E/e' lateral: 10.0 LV SV:         29 LV SV Index:   19 LVOT Area:     1.77 cm  RIGHT VENTRICLE RV S prime:     11.80 cm/s TAPSE (M-mode): 2.0 cm LEFT ATRIUM             Index LA  diam:        3.20 cm 2.05 cm/m LA Vol (A2C):   34.1 ml 21.86 ml/m LA Vol (A4C):   34.2 ml 21.92 ml/m LA Biplane Vol: 35.3 ml 22.63 ml/m  AORTIC VALVE LVOT Vmax:   83.90 cm/s LVOT Vmean:  62.300 cm/s LVOT VTI:    0.166  m  AORTA Ao Root diam: 2.40 cm MITRAL VALVE               TRICUSPID VALVE MV Area (PHT): 3.54 cm    TR Peak grad:   32.3 mmHg MV Decel Time: 214 msec    TR Vmax:        284.00 cm/s MV E velocity: 61.40 cm/s MV A velocity: 90.20 cm/s  SHUNTS MV E/A ratio:  0.68        Systemic VTI:  0.17 m                            Systemic Diam: 1.50 cm Skeet Latch MD Electronically signed by Skeet Latch MD Signature Date/Time: 02/05/2021/5:21:40 PM    Final    [place] CHEST SOFT TISSUE  Result Date: 02/05/2021 CLINICAL DATA:  History of left-sided chest port, possible underlying infection EXAM: ULTRASOUND OF CHEST SOFT TISSUES TECHNIQUE: Ultrasound examination of the chest wall soft tissues was performed in the area of clinical concern. COMPARISON:  Chest x-ray from the previous day. FINDINGS: Sonographic evaluation in the left chest wall adjacent to the known chest wall port shows the port reservoir and catheter to be within normal limits. No surrounding hypoechoic collection is identified to suggest port abscess. IMPRESSION: No evidence of port abscess. Electronically Signed   By: Inez Catalina M.D.   On: 02/05/2021 15:50    Anti-infectives (From admission, onward)    Start     Dose/Rate Route Frequency Ordered Stop   02/05/21 2200  meropenem (MERREM) 1 g in sodium chloride 0.9 % 100 mL IVPB        1 g 200 mL/hr over 30 Minutes Intravenous Every 12 hours 02/05/21 1127     02/04/21 1915  vancomycin (VANCOCIN) capsule 125 mg       Note to Pharmacy: Patient taking differently: Start date: 01/15/21     125 mg Oral 4 times daily 02/04/21 1820     02/04/21 1900  ceFEPIme (MAXIPIME) 2 g in sodium chloride 0.9 % 100 mL IVPB  Status:  Discontinued        2 g 200 mL/hr over 30 Minutes Intravenous  Every 12 hours 02/04/21 1802 02/05/21 1127        Assessment/Plan Infected port a cath Patient with blood port a cath samples growing gram negative rods in both aerobic and anaerobic bottles. 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 They have requested port removal. I have reached out to her Rheumatologist office (Dr. Harrietta Guardian at [facility]) and spoke with him directly. 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  - Will plan for port removal tomorrow pending OR availability.  - NPO at midnight - Abx per ID  FEN - NPO at midnight VTE - SCDs, Lovenox ID - PO Vanc. IV Merrem  Hypogammaglobinemia on immunoglobulin q3 weeks (last received Friday) and followed by Dr. Harrietta Guardian at [facility]. Diff HTN Bipolar disorder Hypothroidism  Jillyn Ledger, [facility] Surgery 02/06/2021, 2:29 PM Please see Amion for pager number during day hours 7:00am-4:30pm

## 2021-02-06 NOTE — Progress Notes (Signed)
   02/06/21 1113  Mobility  Activity Ambulated in hall  Level of Assistance Standby assist, set-up cues, supervision of patient - no hands on  Assistive Device Front wheel walker  Mobility Ambulated with assistance in hallway  Mobility Response Tolerated well  Mobility performed by Mobility specialist  $Mobility charge 1 Mobility    Pt agreed to ambulate in hallway with walker. Tolerated well. Stated she felt a little weakness during ambulation. No other complaints. Left pt in chair without alarm per nurse.   [facility] Specialist Acute Rehab Services Office: [phone]

## 2021-02-06 NOTE — Progress Notes (Signed)
ID Brief Note    BLOOD PORTA CATH LEFT CHEST  Performed at [facility] [address] [place], [place] [address]   Special Requests BOTTLES DRAWN AEROBIC AND ANAEROBIC Blood Culture adequate volume  Performed at [facility] [address], [place], [place] [address]   Culture  Setup Time GRAM NEGATIVE RODS  IN BOTH AEROBIC AND ANAEROBIC BOTTLES  Organism ID to follow  CRITICAL RESULT CALLED TO, READ BACK BY AND VERIFIED WITHMelodye Ped [facility] 0340 02/05/21 A BROWNING  Performed at [facility], [place] [address] [place], [place] [address]   Culture PENDING   Report Status PENDING     This is most likely the same organism isolated earlier in blood cultures as Herbspirillum huttience. This organism has potential to cause septicemia, pneumonia, cellulitis etc more in immunosuppressed patient.   Would recommend to have portacath removed for better source control  Continue Meropenem as is, will follow sensitivities Continue PO Vancomycin 125mg  PO qid for 14 days followed by a tapering course   Rosiland Oz, MD Infectious Disease Physician [facility] [address] [place], [place] [address] Phone: [phone]  Fax: [phone]

## 2021-02-07 ENCOUNTER — Encounter (HOSPITAL_COMMUNITY): Payer: Self-pay | Admitting: Family Medicine

## 2021-02-07 ENCOUNTER — Inpatient Hospital Stay (HOSPITAL_COMMUNITY): Payer: Medicare PPO | Admitting: Certified Registered Nurse Anesthetist

## 2021-02-07 ENCOUNTER — Encounter (HOSPITAL_COMMUNITY)
Admission: AD | Disposition: A | Discharge status: Home / Self Care | Payer: Self-pay | Source: Ambulatory Visit | Attending: Internal Medicine

## 2021-02-07 HISTORY — PX: PORT-A-CATH REMOVAL: SHX5289

## 2021-02-07 LAB — SURGICAL PCR SCREEN
MRSA, PCR: NEGATIVE
Staphylococcus aureus: POSITIVE — AB

## 2021-02-07 SURGERY — REMOVAL PORT-A-CATH
Anesthesia: Monitor Anesthesia Care | Site: Chest | Laterality: Left

## 2021-02-07 MED ORDER — BUPIVACAINE-EPINEPHRINE (PF) 0.25% -1:200000 IJ SOLN
INTRAMUSCULAR | Status: DC | PRN
  Administered 2021-02-07: 18 mL

## 2021-02-07 MED ORDER — CHLORHEXIDINE GLUCONATE CLOTH 2 % EX PADS
6.0000 | MEDICATED_PAD | Freq: Every day | CUTANEOUS | Status: DC
  Administered 2021-02-09: 6 via TOPICAL

## 2021-02-07 MED ORDER — LACTATED RINGERS IV SOLN: INTRAVENOUS | Status: DC

## 2021-02-07 MED ORDER — CHLORHEXIDINE GLUCONATE 0.12 % MT SOLN
15.0000 mL | Freq: Once | OROMUCOSAL | Status: AC
  Administered 2021-02-07: 15 mL via OROMUCOSAL

## 2021-02-07 MED ORDER — MUPIROCIN 2 % EX OINT
1.0000 "application " | TOPICAL_OINTMENT | Freq: Two times a day (BID) | CUTANEOUS | Status: AC
  Administered 2021-02-07 – 2021-02-11 (×9): 1 via NASAL
  Filled 2021-02-07 (×2): qty 22

## 2021-02-07 MED ORDER — FENTANYL CITRATE (PF) 100 MCG/2ML IJ SOLN: 25.0000 ug | INTRAMUSCULAR | Status: DC | PRN

## 2021-02-07 MED ORDER — FENTANYL CITRATE (PF) 100 MCG/2ML IJ SOLN
INTRAMUSCULAR | Status: AC
  Filled 2021-02-07: qty 2

## 2021-02-07 MED ORDER — PROPOFOL 500 MG/50ML IV EMUL
INTRAVENOUS | Status: DC | PRN
  Administered 2021-02-07: 100 ug/kg/min via INTRAVENOUS

## 2021-02-07 MED ORDER — KETOROLAC TROMETHAMINE 30 MG/ML IJ SOLN
INTRAMUSCULAR | Status: AC
  Filled 2021-02-07: qty 1

## 2021-02-07 MED ORDER — PROPOFOL 10 MG/ML IV BOLUS
INTRAVENOUS | Status: DC | PRN
  Administered 2021-02-07: 20 mg via INTRAVENOUS
  Administered 2021-02-07: 30 mg via INTRAVENOUS

## 2021-02-07 MED ORDER — 0.9 % SODIUM CHLORIDE (POUR BTL) OPTIME
TOPICAL | Status: DC | PRN
  Administered 2021-02-07: 1000 mL

## 2021-02-07 MED ORDER — ONDANSETRON HCL 4 MG/2ML IJ SOLN
INTRAMUSCULAR | Status: AC
  Filled 2021-02-07: qty 2

## 2021-02-07 MED ORDER — BUPIVACAINE-EPINEPHRINE (PF) 0.25% -1:200000 IJ SOLN
INTRAMUSCULAR | Status: AC
  Filled 2021-02-07: qty 30

## 2021-02-07 MED ORDER — LIDOCAINE 2% (20 MG/ML) 5 ML SYRINGE
INTRAMUSCULAR | Status: AC
  Filled 2021-02-07: qty 5

## 2021-02-07 MED ORDER — PROPOFOL 10 MG/ML IV BOLUS
INTRAVENOUS | Status: AC
  Filled 2021-02-07: qty 20

## 2021-02-07 MED ORDER — DEXAMETHASONE SODIUM PHOSPHATE 10 MG/ML IJ SOLN
INTRAMUSCULAR | Status: AC
  Filled 2021-02-07: qty 1

## 2021-02-07 SURGICAL SUPPLY — 13 items
DERMABOND ADVANCED (GAUZE/BANDAGES/DRESSINGS) ×1
DERMABOND ADVANCED .7 DNX12 (GAUZE/BANDAGES/DRESSINGS) ×1 IMPLANT
DRAPE LAPAROSCOPIC ABDOMINAL (DRAPES) ×2 IMPLANT
ELECT PENCIL ROCKER SW 15FT (MISCELLANEOUS) ×2 IMPLANT
GAUZE 4X4 16PLY RFD (DISPOSABLE) ×2 IMPLANT
GOWN STRL REIN XL XLG (GOWN DISPOSABLE) ×4 IMPLANT
KIT BASIN OR (CUSTOM PROCEDURE TRAY) ×2 IMPLANT
NEEDLE HYPO 25X1 1.5 SAFETY (NEEDLE) ×2 IMPLANT
PACK BASIC (CUSTOM PROCEDURE TRAY) ×2 IMPLANT
SUT MNCRL AB 4-0 PS2 18 (SUTURE) ×2 IMPLANT
SUT VIC AB 3-0 SH 27 (SUTURE) ×1
SUT VIC AB 3-0 SH 27XBRD (SUTURE) ×1 IMPLANT
SYR 20ML LL LF (SYRINGE) ×2 IMPLANT

## 2021-02-07 NOTE — Progress Notes (Signed)
Day of Surgery   Chief Complaint/Subjective: No events overnight, she notes being a difficult stick  Review of Systems See above, otherwise other systems negative  Objective: Vital signs in last 24 hours: Temp:  [98.1 F (36.7 C)-98.9 F (37.2 C)] 98.5 F (36.9 C) (06/24 0432) Pulse Rate:  [76-89] 77 (06/24 0432) Resp:  [18] 18 (06/24 0432) BP: (129-138)/(62-68) 134/68 (06/24 0432) SpO2:  [99 %-100 %] 99 % (06/24 0432) Weight:  [55.6 kg] 55.6 kg (06/24 0500) Last BM Date: 02/06/21 Intake/Output from previous day: No intake/output data recorded. Intake/Output this shift: No intake/output data recorded.  PE: Gen: NAd Resp: nonlabored Card: Left sided port in position, RRR Abd: soft, Nt  Lab Results:  Recent Labs    02/04/21 1711  WBC 6.2  HGB 12.2  HCT 38.5  PLT 200   BMET Recent Labs    02/04/21 1711 02/05/21 0310  NA 138 136  K 5.4* 3.8  CL 103 104  CO2 28 25  GLUCOSE 93 149*  BUN 34* 28*  CREATININE 1.11* 1.13*  CALCIUM 8.7* 7.9*   PT/INR No results for input(s): LABPROT, INR in the last 72 hours. CMP     Component Value Date/Time   NA 136 02/05/2021 0310   K 3.8 02/05/2021 0310   CL 104 02/05/2021 0310   CO2 25 02/05/2021 0310   GLUCOSE 149 (H) 02/05/2021 0310   BUN 28 (H) 02/05/2021 0310   CREATININE 1.13 (H) 02/05/2021 0310   CREATININE 0.97 (H) 01/02/2021 0000   CALCIUM 7.9 (L) 02/05/2021 0310   PROT 6.5 02/05/2021 0310   ALBUMIN 2.9 (L) 02/05/2021 0310   AST 36 02/05/2021 0310   ALT 31 02/05/2021 0310   ALKPHOS 66 02/05/2021 0310   BILITOT 0.4 02/05/2021 0310   GFRNONAA 52 (L) 02/05/2021 0310   GFRNONAA 33 (L) 06/10/2020 1549   GFRAA 38 (L) 06/10/2020 1549   Lipase     Component Value Date/Time   LIPASE 32 12/09/2020 1138    Studies/Results: ECHOCARDIOGRAM COMPLETE  Result Date: 02/05/2021    ECHOCARDIOGRAM REPORT   Patient Name:   [facility] Date of Exam: 02/05/2021 Medical Rec #:  [id number]          Height:        62.0 in Accession #:    [id number]         Weight:       124.0 lb Date of Birth:  1946/06/26         BSA:          1.560 m Patient Age:    75 years           BP:           124/66 mmHg Patient Gender: F                  HR:           67 bpm. Exam Location:  Inpatient Procedure: 2D Echo, Color Doppler and Cardiac Doppler Indications:    Bacteremia R78.81  History:        Patient has prior history of Echocardiogram examinations, most                 recent 01/15/2021. Risk Factors:Sleep Apnea. Gram negative                 bacteria and c diff infection. Chronic kidney disease.  Hypothyroid. GERD. Lung cancer.  Sonographer:    Darlina Sicilian RDCS Referring Phys: [id number] [facility]  Sonographer Comments: Technically challenging study due to limited acoustic windows, suboptimal parasternal window and suboptimal apical window. IMPRESSIONS  1. Left ventricular ejection fraction, by estimation, is 60 to 65%. The left ventricle has normal function. The left ventricle has no regional wall motion abnormalities. Left ventricular diastolic parameters are consistent with Grade I diastolic dysfunction (impaired relaxation).  2. Right ventricular systolic function is normal. The right ventricular size is normal. There is normal pulmonary artery systolic pressure.  3. The mitral valve is normal in structure. No evidence of mitral valve regurgitation. No evidence of mitral stenosis.  4. The aortic valve was not well visualized. Aortic valve regurgitation is trivial. No aortic stenosis is present.  5. The inferior vena cava is normal in size with greater than 50% respiratory variability, suggesting right atrial pressure of 3 mmHg. FINDINGS  Left Ventricle: Left ventricular ejection fraction, by estimation, is 60 to 65%. The left ventricle has normal function. The left ventricle has no regional wall motion abnormalities. The left ventricular internal cavity size was normal in size. There is  no left ventricular  hypertrophy. Left ventricular diastolic parameters are consistent with Grade I diastolic dysfunction (impaired relaxation). Normal left ventricular filling pressure. Right Ventricle: The right ventricular size is normal. No increase in right ventricular wall thickness. Right ventricular systolic function is normal. There is normal pulmonary artery systolic pressure. The tricuspid regurgitant velocity is 2.84 m/s, and  with an assumed right atrial pressure of 3 mmHg, the estimated right ventricular systolic pressure is 25.4 mmHg. Left Atrium: Left atrial size was normal in size. Right Atrium: Right atrial size was normal in size. Pericardium: There is no evidence of pericardial effusion. Mitral Valve: The mitral valve is normal in structure. No evidence of mitral valve regurgitation. No evidence of mitral valve stenosis. Tricuspid Valve: The tricuspid valve is normal in structure. Tricuspid valve regurgitation is mild . No evidence of tricuspid stenosis. Aortic Valve: The aortic valve was not well visualized. Aortic valve regurgitation is trivial. No aortic stenosis is present. Pulmonic Valve: The pulmonic valve was normal in structure. Pulmonic valve regurgitation is not visualized. No evidence of pulmonic stenosis. Aorta: The aortic root is normal in size and structure. Venous: The inferior vena cava is normal in size with greater than 50% respiratory variability, suggesting right atrial pressure of 3 mmHg. IAS/Shunts: No atrial level shunt detected by color flow Doppler.  LEFT VENTRICLE PLAX 2D LVIDd:         3.90 cm  Diastology LVIDs:         2.40 cm  LV e' medial:    6.27 cm/s LV PW:         0.90 cm  LV E/e' medial:  9.8 LV IVS:        0.70 cm  LV e' lateral:   6.11 cm/s LVOT diam:     1.50 cm  LV E/e' lateral: 10.0 LV SV:         29 LV SV Index:   19 LVOT Area:     1.77 cm  RIGHT VENTRICLE RV S prime:     11.80 cm/s TAPSE (M-mode): 2.0 cm LEFT ATRIUM             Index LA diam:        3.20 cm 2.05 cm/m LA Vol  (A2C):   34.1 ml 21.86 ml/m LA Vol (A4C):   34.2  ml 21.92 ml/m LA Biplane Vol: 35.3 ml 22.63 ml/m  AORTIC VALVE LVOT Vmax:   83.90 cm/s LVOT Vmean:  62.300 cm/s LVOT VTI:    0.166 m  AORTA Ao Root diam: 2.40 cm MITRAL VALVE               TRICUSPID VALVE MV Area (PHT): 3.54 cm    TR Peak grad:   32.3 mmHg MV Decel Time: 214 msec    TR Vmax:        284.00 cm/s MV E velocity: 61.40 cm/s MV A velocity: 90.20 cm/s  SHUNTS MV E/A ratio:  0.68        Systemic VTI:  0.17 m                            Systemic Diam: 1.50 cm Skeet Latch MD Electronically signed by Skeet Latch MD Signature Date/Time: 02/05/2021/5:21:40 PM    Final    [place] CHEST SOFT TISSUE  Result Date: 02/05/2021 CLINICAL DATA:  History of left-sided chest port, possible underlying infection EXAM: ULTRASOUND OF CHEST SOFT TISSUES TECHNIQUE: Ultrasound examination of the chest wall soft tissues was performed in the area of clinical concern. COMPARISON:  Chest x-ray from the previous day. FINDINGS: Sonographic evaluation in the left chest wall adjacent to the known chest wall port shows the port reservoir and catheter to be within normal limits. No surrounding hypoechoic collection is identified to suggest port abscess. IMPRESSION: No evidence of port abscess. Electronically Signed   By: Inez Catalina M.D.   On: 02/05/2021 15:50    Anti-infectives: Anti-infectives (From admission, onward)    Start     Dose/Rate Route Frequency Ordered Stop   02/05/21 2200  meropenem (MERREM) 1 g in sodium chloride 0.9 % 100 mL IVPB        1 g 200 mL/hr over 30 Minutes Intravenous Every 12 hours 02/05/21 1127     02/04/21 1915  vancomycin (VANCOCIN) capsule 125 mg       Note to Pharmacy: Patient taking differently: Start date: 01/15/21     125 mg Oral 4 times daily 02/04/21 1820     02/04/21 1900  ceFEPIme (MAXIPIME) 2 g in sodium chloride 0.9 % 100 mL IVPB  Status:  Discontinued        2 g 200 mL/hr over 30 Minutes Intravenous Every 12 hours 02/04/21  1802 02/05/21 1127       Assessment/Plan 75 yo female with infected port with bactermia -remove port today in OR -attempt peripheral IV to avoid use of infected deep line   FEN - NPO VTE - enoxaparin ID - vanc PO, meropenem Disposition - inpatient   LOS: 3 days   Mickeal Skinner , [facility] Surgery 02/07/2021, 7:55 AM Please see Amion for pager number during day hours 7:00am-4:30pm or 7:00am -11:30am on weekends

## 2021-02-07 NOTE — Anesthesia Postprocedure Evaluation (Signed)
Anesthesia Post Note  Patient: Megan Brewer  Procedure(s) Performed: REMOVAL PORT-A-CATH (Left: Chest)     Patient location during evaluation: PACU Anesthesia Type: MAC Level of consciousness: awake and alert Pain management: pain level controlled Vital Signs Assessment: post-procedure vital signs reviewed and stable Respiratory status: spontaneous breathing Cardiovascular status: stable Anesthetic complications: no   No notable events documented.  Last Vitals:  Vitals:   02/07/21 1300 02/07/21 1346  BP: (!) 142/67 (!) 156/63  Pulse: 78 79  Resp: 12 14  Temp:  36.4 C  SpO2: 100% 100%    Last Pain:  Vitals:   02/07/21 1418  TempSrc:   PainSc: [place]

## 2021-02-07 NOTE — Progress Notes (Addendum)
Brief note  Patient not seen, chart reviewed  Afebrile Port-A-Cath removed today Blood from Port-A-Cath cultures growing gram-negative rods, sensitivities are pending Blood cx 5/31  Herbspirillum huttience, sensitivities pending   Continue meropenem for now while pending above sensitivity We will plan for 7 days of antibiotics post removal of port Continue vancomycin p.o. as previously recommended  Dr. Gale Journey follow cultures over the weekend and make changes as needed. Otherwise, I will follow up on Monday  Rosiland Oz, MD Infectious Disease Physician [facility] [address] [place], [place] [address] Phone: [phone]  Fax: [phone]

## 2021-02-07 NOTE — Progress Notes (Signed)
PROGRESS NOTE  Megan Brewer TXM:[id number] DOB: Oct 08, 1945 DOA: 02/04/2021 PCP: [name], NP   LOS: 3 days   Brief Narrative / Interim history: 75 year old female with bipolar disorder, lung cancer, C. difficile, hypogammaglobulinemia, hypothyroidism, gastric bypass comes in as a direct admission from the ID clinic with rigors, malaise.  She was recently hospitalized and treated for gram-negative bacteremia and C. difficile infection.  She finished 14 days of cefepime on 6/15, and following that over the course of the last several days she started feeling poorly, fatigue, rigors, generalized weakness and came back to the hospital.  Her diarrhea was better on oral vancomycin however got worse just prior to admission.  Subjective / 24h Interval events: Feeling well, no nausea, no vomiting.  Assessment & Plan: Principal Problem Concern for persistent bacteremia-recently with gram-negative's.  ID consulted and following, she apparently grew Herbspirillum huttience.  Continue meropenem, oral vancomycin per ID.  ID recommends her staying hospitalized until speciation results which may be another week or 2 -2D echo without evidence of vegetations -Blood cultures obtained from port 6/22 are growing again gram-negative rods.   -Port is to be removed today by general surgery  Active Problems C. difficile diarrhea-continue oral vancomycin  History of hypogammaglobinemia-receives Gammagard every 3 weeks, last dose 6/17  Recent Enterococcus UTI-treated  Hypothyroidism-continue Synthroid  Overactive bladder-continue home medications  Urinary retention-continue Flomax  History of gastric bypass-noted  Essential hypertension-continue Norvasc   Scheduled Meds:  [MAR Hold] amLODipine  10 mg Oral Daily   [MAR Hold] calcium citrate  200 mg of elemental calcium Oral Daily   [MAR Hold] Chlorhexidine Gluconate Cloth  6 each Topical Daily   [MAR Hold] cholecalciferol  1,000 Units  Oral Daily   [MAR Hold] cycloSPORINE  1 drop Both Eyes BID   [MAR Hold] enoxaparin (LOVENOX) injection  40 mg Subcutaneous Q24H   [MAR Hold] estradiol  1 Applicatorful Vaginal Once per day on Mon Wed Fri   [facility] Hold] ferrous sulfate  650 mg Oral Q breakfast   [MAR Hold] ketotifen  1 drop Both Eyes Daily   [MAR Hold] lamoTRIgine  200 mg Oral QHS   [MAR Hold] levothyroxine  75 mcg Oral Q0600   [MAR Hold] magnesium oxide  400 mg Oral Daily   [MAR Hold] mirtazapine  30 mg Oral QHS   [MAR Hold] oxybutynin  2.5 mg Oral BID   [MAR Hold] pantoprazole  40 mg Oral Daily   [MAR Hold] potassium chloride SA  40 mEq Oral Daily   [MAR Hold] rOPINIRole  1 mg Oral QHS   [MAR Hold] saccharomyces boulardii  250 mg Oral BID   [MAR Hold] sodium chloride flush  10-40 mL Intracatheter Q12H   [MAR Hold] tamsulosin  0.4 mg Oral Daily   [MAR Hold] vancomycin  125 mg Oral QID   [MAR Hold] zinc oxide   Topical BID   Continuous Infusions:  lactated ringers 10 mL/hr at 02/07/21 1046   [MAR Hold] meropenem (MERREM) IV 1 g (02/07/21 0913)   PRN Meds:.[MAR Hold] acetaminophen **OR** [MAR Hold] acetaminophen, [MAR Hold] azelastine **AND** [MAR Hold] fluticasone, [MAR Hold] baclofen, [MAR Hold] hydrOXYzine, [MAR Hold] ondansetron (ZOFRAN) IV, [MAR Hold] oxyCODONE, [MAR Hold] polyethylene glycol, [MAR Hold] promethazine, [MAR Hold] sodium chloride flush, [MAR Hold] sucralfate, [MAR Hold] tiZANidine  Diet Orders (From admission, onward)     Start     Ordered   02/07/21 0001  Diet NPO time specified Except for: Sips with Meds  Diet effective midnight  Question:  Except for  Answer:  Ferrel Logan with Meds   02/06/21 1611            DVT prophylaxis: enoxaparin (LOVENOX) injection 40 mg Start: 02/04/21 2200     Code Status: Full Code  Family Communication: No family at bedside  Status is: Inpatient  Remains inpatient appropriate because:Inpatient level of care appropriate due to severity of illness  Dispo:  The patient is from: Home              Anticipated d/c is to: Home              Patient currently is not medically stable to d/c.   Difficult to place patient No   Level of care: Telemetry  Consultants:  Infectious disease  Procedures:  2D echo  1. Left ventricular ejection fraction, by estimation, is 60 to 65%. The left ventricle has normal function. The left ventricle has no regional wall motion abnormalities. Left ventricular diastolic parameters are consistent with Grade I diastolic dysfunction (impaired relaxation).   2. Right ventricular systolic function is normal. The right ventricular size is normal. There is normal pulmonary artery systolic pressure.   3. The mitral valve is normal in structure. No evidence of mitral valve regurgitation. No evidence of mitral stenosis.   4. The aortic valve was not well visualized. Aortic valve regurgitation is trivial. No aortic stenosis is present.   5. The inferior vena cava is normal in size with greater than 50% respiratory variability, suggesting right atrial pressure of 3 mmHg.   Microbiology  Blood cultures -gram-negative rods from the port draw  Antimicrobials: Meropenem    Objective: Vitals:   02/07/21 0432 02/07/21 0500 02/07/21 1036 02/07/21 1041  BP: 134/68  (!) 141/57   Pulse: 77  73   Resp: 18  16   Temp: 98.5 F (36.9 C)  99.2 F (37.3 C)   TempSrc:   Oral   SpO2: 99%  100%   Weight:  55.6 kg    Height:    5\' 2"  (1.575 m)   No intake or output data in the 24 hours ending 02/07/21 1050  Filed Weights   02/05/21 0500 02/06/21 0500 02/07/21 0500  Weight: 56.2 kg 54.7 kg 55.6 kg    Examination:  Constitutional: She is in no distress, in bed Eyes: No scleral icterus ENMT: Moist mucous membranes Neck: normal, supple Respiratory: Clear bilaterally, no wheezing Cardiovascular: Regular rate and rhythm, no murmurs, no edema Abdomen: Soft, nontender, nondistended, bowel sounds positive Musculoskeletal: no  clubbing / cyanosis.  Skin: No rashes seen Neurologic: No focal deficits  Data Reviewed: I have independently reviewed following labs and imaging studies   CBC: Recent Labs  Lab 02/04/21 1711  WBC 6.2  NEUTROABS 4.3  HGB 12.2  HCT 38.5  MCV 93.0  PLT 527    Basic Metabolic Panel: Recent Labs  Lab 02/04/21 1711 02/05/21 0310  NA 138 136  K 5.4* 3.8  CL 103 104  CO2 28 25  GLUCOSE 93 149*  BUN 34* 28*  CREATININE 1.11* 1.13*  CALCIUM 8.7* 7.9*    Liver Function Tests: Recent Labs  Lab 02/04/21 1711 02/05/21 0310  AST 35 36  ALT 32 31  ALKPHOS 78 66  BILITOT 0.3 0.4  PROT 8.1 6.5  ALBUMIN 3.6 2.9*    Coagulation Profile: No results for input(s): INR, PROTIME in the last 168 hours. HbA1C: No results for input(s): HGBA1C in the last 72 hours. CBG: No results  for input(s): GLUCAP in the last 168 hours.  Recent Results (from the past 240 hour(s))  Culture, blood (routine x 2)     Status: None (Preliminary result)   Collection Time: 02/04/21  5:12 PM   Specimen: BLOOD  Result Value Ref Range Status   Specimen Description   Final    BLOOD RIGHT ARM Performed at [facility] [address] [place], [place] [address]    Special Requests   Final    BOTTLES DRAWN AEROBIC AND ANAEROBIC Blood Culture adequate volume Performed at [facility] [address] [place], [place] [address]    Culture   Final    NO GROWTH 2 DAYS Performed at [facility] [address] [place], [place] [address]    Report Status PENDING  Incomplete  Culture, blood (routine x 2)     Status: None (Preliminary result)   Collection Time: 02/04/21  5:12 PM   Specimen: BLOOD  Result Value Ref Range Status   Specimen Description   Final    BLOOD LEFT ANTECUBITAL Performed at [facility] [address], [place], [place] [address]    Special Requests   Final    BOTTLES DRAWN AEROBIC ONLY Blood Culture adequate  volume Performed at [facility] [address] [place], [place] [address]    Culture   Final    NO GROWTH 2 DAYS Performed at [facility] [address], [place],  [address]    Report Status PENDING  Incomplete  Resp Panel by RT-PCR (Flu A&B, Covid) Nasopharyngeal Swab     Status: None   Collection Time: 02/04/21  5:56 PM   Specimen: Nasopharyngeal Swab; Nasopharyngeal(NP) swabs in vial transport medium  Result Value Ref Range Status   SARS Coronavirus 2 by RT PCR NEGATIVE NEGATIVE Final    Comment: (NOTE) SARS-CoV-2 target nucleic acids are NOT DETECTED.  The SARS-CoV-2 RNA is generally detectable in upper respiratory specimens during the acute phase of infection. The lowest concentration of SARS-CoV-2 viral copies this assay can detect is 138 copies/mL. A negative result does not preclude SARS-Cov-2 infection and should not be used as the sole basis for treatment or other patient management decisions. A negative result may occur with  improper specimen collection/handling, submission of specimen other than nasopharyngeal swab, presence of viral mutation(s) within the areas targeted by this assay, and inadequate number of viral copies(<138 copies/mL). A negative result must be combined with clinical observations, patient history, and epidemiological information. The expected result is Negative.  Fact Sheet for Patients:  EntrepreneurPulse.com.au  Fact Sheet for Healthcare Providers:  IncredibleEmployment.be  This test is no t yet approved or cleared by the [place] FDA and  has been authorized for detection and/or diagnosis of SARS-CoV-2 by FDA under an Emergency Use Authorization (EUA). This EUA will remain  in effect (meaning this test can be used) for the duration of the COVID-19 declaration under Section 564(b)(1) of the Act, 21 U.S.C.section 360bbb-3(b)(1), unless the authorization is terminated   or revoked sooner.       Influenza A by PCR NEGATIVE NEGATIVE Final   Influenza B by PCR NEGATIVE NEGATIVE Final    Comment: (NOTE) The Xpert Xpress SARS-CoV-2/FLU/RSV plus assay is intended as an aid in the diagnosis of influenza from Nasopharyngeal swab specimens and should not be used as a sole basis for treatment. Nasal washings and aspirates are unacceptable for Xpert Xpress SARS-CoV-2/FLU/RSV testing.  Fact  Sheet for Patients: EntrepreneurPulse.com.au  Fact Sheet for Healthcare Providers: IncredibleEmployment.be  This test is not yet approved or cleared by the [place] FDA and has been authorized for detection and/or diagnosis of SARS-CoV-2 by FDA under an Emergency Use Authorization (EUA). This EUA will remain in effect (meaning this test can be used) for the duration of the COVID-19 declaration under Section 564(b)(1) of the Act, 21 U.S.C. section 360bbb-3(b)(1), unless the authorization is terminated or revoked.  Performed at [facility], [place] [address], [place], [place] [address]   Culture, blood (single)     Status: None (Preliminary result)   Collection Time: 02/04/21  6:37 PM   Specimen: BLOOD  Result Value Ref Range Status   Specimen Description   Final    BLOOD PORTA CATH LEFT CHEST Performed at [facility] [address], [place], [place] [address]    Special Requests   Final    BOTTLES DRAWN AEROBIC AND ANAEROBIC Blood Culture adequate volume Performed at [facility] [address], [place], [place] [address]    Culture  Setup Time   Final    GRAM NEGATIVE RODS IN BOTH AEROBIC AND ANAEROBIC BOTTLES CRITICAL RESULT CALLED TO, READ BACK BY AND VERIFIED WITH: Melodye Ped [facility] 1194 02/05/21 A BROWNING    Culture   Final    GRAM NEGATIVE RODS CULTURE REINCUBATED FOR BETTER GROWTH Performed at [facility], [place] [address], [place],   [address]    Report Status PENDING  Incomplete  Blood Culture ID Panel (Reflexed)     Status: None   Collection Time: 02/04/21  6:37 PM  Result Value Ref Range Status   Enterococcus faecalis NOT DETECTED NOT DETECTED Final   Enterococcus Faecium NOT DETECTED NOT DETECTED Final   Listeria monocytogenes NOT DETECTED NOT DETECTED Final   Staphylococcus species NOT DETECTED NOT DETECTED Final   Staphylococcus aureus (BCID) NOT DETECTED NOT DETECTED Final   Staphylococcus epidermidis NOT DETECTED NOT DETECTED Final   Staphylococcus lugdunensis NOT DETECTED NOT DETECTED Final   Streptococcus species NOT DETECTED NOT DETECTED Final   Streptococcus agalactiae NOT DETECTED NOT DETECTED Final   Streptococcus pneumoniae NOT DETECTED NOT DETECTED Final   Streptococcus pyogenes NOT DETECTED NOT DETECTED Final   A.calcoaceticus-baumannii NOT DETECTED NOT DETECTED Final   Bacteroides fragilis NOT DETECTED NOT DETECTED Final   Enterobacterales NOT DETECTED NOT DETECTED Final   Enterobacter cloacae complex NOT DETECTED NOT DETECTED Final   Escherichia coli NOT DETECTED NOT DETECTED Final   Klebsiella aerogenes NOT DETECTED NOT DETECTED Final   Klebsiella oxytoca NOT DETECTED NOT DETECTED Final   Klebsiella pneumoniae NOT DETECTED NOT DETECTED Final   Proteus species NOT DETECTED NOT DETECTED Final   Salmonella species NOT DETECTED NOT DETECTED Final   Serratia marcescens NOT DETECTED NOT DETECTED Final   Haemophilus influenzae NOT DETECTED NOT DETECTED Final   Neisseria meningitidis NOT DETECTED NOT DETECTED Final   Pseudomonas aeruginosa NOT DETECTED NOT DETECTED Final   Stenotrophomonas maltophilia NOT DETECTED NOT DETECTED Final   Candida albicans NOT DETECTED NOT DETECTED Final   Candida auris NOT DETECTED NOT DETECTED Final   Candida glabrata NOT DETECTED NOT DETECTED Final   Candida krusei NOT DETECTED NOT DETECTED Final   Candida parapsilosis NOT DETECTED NOT DETECTED Final   Candida  tropicalis NOT DETECTED NOT DETECTED Final   Cryptococcus neoformans/gattii NOT DETECTED NOT DETECTED Final    Comment: Performed at [facility], [address], [place], [place] [address]  Culture, Urine     Status: None   Collection Time: 02/05/21  2:00 AM   Specimen: Urine, Clean Catch  Result Value Ref Range Status   Specimen Description   Final    URINE, CLEAN CATCH Performed at [facility], [place] [address], [place], [place] [address]    Special Requests   Final    NONE Performed at [facility], [place] [address] [place], [place] [address]    Culture   Final    NO GROWTH Performed at [facility], [place] [address], [place], [place] [address]    Report Status 02/06/2021 FINAL  Final  Surgical pcr screen     Status: Abnormal   Collection Time: 02/07/21  8:03 AM   Specimen: Nasal Mucosa; Nasal Swab  Result Value Ref Range Status   MRSA, PCR NEGATIVE NEGATIVE Final   Staphylococcus aureus POSITIVE (A) NEGATIVE Final    Comment: (NOTE) The Xpert SA Assay (FDA approved for NASAL specimens in patients 76 years of age and older), is one component of a comprehensive surveillance program. It is not intended to diagnose infection nor to guide or monitor treatment. Performed at [facility], [place] [address] [place],  [address]       Radiology Studies: No results found.  Marzetta Board, MD, PhD [facility]  Between 7 am - 7 pm I am available, please contact me via Amion (for emergencies) or Securechat (non urgent messages)  Between 7 pm - 7 am I am not available, please contact night coverage MD/APP via Amion

## 2021-02-07 NOTE — Anesthesia Preprocedure Evaluation (Signed)
Anesthesia Evaluation  Patient identified by MRN, date of birth, ID band Patient awake    Reviewed: Allergy & Precautions, NPO status , Patient's Chart, lab work & pertinent test results  Airway Mallampati: II  TM Distance: >3 FB Neck ROM: Full    Dental  (+) Dental Advisory Given, Poor Dentition, Missing, Chipped   Pulmonary sleep apnea ,    Pulmonary exam normal breath sounds clear to auscultation       Cardiovascular Normal cardiovascular exam+ dysrhythmias  Rhythm:Regular Rate:Normal     Neuro/Psych  Headaches, PSYCHIATRIC DISORDERS Depression Bipolar Disorder    GI/Hepatic Neg liver ROS, GERD  ,  Endo/Other  Hypothyroidism   Renal/GU Renal disease     Musculoskeletal negative musculoskeletal ROS (+)   Abdominal   Peds  Hematology  (+) Blood dyscrasia, anemia ,   Anesthesia Other Findings   Reproductive/Obstetrics                             Anesthesia Physical Anesthesia Plan  ASA: 3  Anesthesia Plan: MAC   Post-op Pain Management:    Induction: Intravenous  PONV Risk Score and Plan: 2 and Ondansetron, Dexamethasone, Treatment may vary due to age or medical condition and Midazolam  Airway Management Planned: Natural Airway  Additional Equipment: None  Intra-op Plan:   Post-operative Plan:   Informed Consent: I have reviewed the patients History and Physical, chart, labs and discussed the procedure including the risks, benefits and alternatives for the proposed anesthesia with the patient or authorized representative who has indicated his/her understanding and acceptance.     Dental advisory given  Plan Discussed with: CRNA  Anesthesia Plan Comments:         Anesthesia Quick Evaluation

## 2021-02-07 NOTE — Transfer of Care (Signed)
Immediate Anesthesia Transfer of Care Note  Patient: Megan Brewer  Procedure(s) Performed: REMOVAL PORT-A-CATH (Left: Chest)  Patient Location: PACU  Anesthesia Type:MAC  Level of Consciousness: awake, alert  and oriented  Airway & Oxygen Therapy: Patient Spontanous Breathing and Patient connected to face mask oxygen  Post-op Assessment: Report given to RN and Post -op Vital signs reviewed and stable  Post vital signs: Reviewed and stable  Last Vitals:  Vitals Value Taken Time  BP    Temp    Pulse    Resp    SpO2      Last Pain:  Vitals:   02/07/21 1040  TempSrc:   PainSc: 0-No pain      Patients Stated Pain Goal: 2 (01/60/10 9323)  Complications: No notable events documented.

## 2021-02-07 NOTE — Op Note (Signed)
Preoperative diagnosis: bacteremia  Postoperative diagnosis: same   Procedure: left subclavian port-o-cath removal  Surgeon: Gurney Maxin, M.D.  Asst: none  Anesthesia: MAC  Indications for procedure: Megan Brewer is a 75 y.o. year old female with symptoms of fever. Her work up showed bactermia. She presents for port-o-cath removal.  Description of procedure: The patient was brought into the operative suite. Anesthesia was administered with Monitored Local Anesthesia with Sedation. WHO checklist was applied. The patient was then placed in supine position. The area was prepped and draped in the usual sterile fashion.  Next, marcaine was injected into the surrounding tissues of the port. Anesthesia was tested and found intact. An incision was made and cautery used to dissect the port free of surrounding tissue. The catheter was gently pulled from the venous tract and removed in its entirety  Findings: intact port-o-cath  Specimen: port-o-cath  Implant: none   Blood loss: 2 ml  Local anesthesia:  18 ml marcaine   Complications: none  Gurney Maxin, M.D. General, Bariatric, & Minimally Invasive Surgery [facility]

## 2021-02-07 NOTE — Progress Notes (Signed)
Patient returns from OR at this time 

## 2021-02-07 NOTE — Progress Notes (Signed)
Patient to OR at this time

## 2021-02-08 ENCOUNTER — Encounter (HOSPITAL_COMMUNITY): Payer: Self-pay | Admitting: General Surgery

## 2021-02-08 LAB — BASIC METABOLIC PANEL
Anion gap: 4 — ABNORMAL LOW (ref 5–15)
BUN: 15 mg/dL (ref 8–23)
CO2: 30 mmol/L (ref 22–32)
Calcium: 9.1 mg/dL (ref 8.9–10.3)
Chloride: 105 mmol/L (ref 98–111)
Creatinine, Ser: 0.89 mg/dL (ref 0.44–1.00)
GFR, Estimated: >60 mL/min (ref >60)
Glucose, Bld: 91 mg/dL (ref 70–99)
Potassium: 4.7 mmol/L (ref 3.5–5.1)
Sodium: 139 mmol/L (ref 135–145)

## 2021-02-08 LAB — CBC
HCT: 37.1 % (ref 36.0–46.0)
Hemoglobin: 11.9 g/dL — ABNORMAL LOW (ref 12.0–15.0)
MCH: 29.7 pg (ref 26.0–34.0)
MCHC: 32.1 g/dL (ref 30.0–36.0)
MCV: 92.5 fL (ref 80.0–100.0)
Platelets: 147 10*3/uL — ABNORMAL LOW (ref 150–400)
RBC: 4.01 MIL/uL (ref 3.87–5.11)
RDW: 13.5 % (ref 11.5–15.5)
WBC: 4 10*3/uL (ref 4.0–10.5)
nRBC: 0 % (ref 0.0–0.2)

## 2021-02-08 MED ORDER — CYANOCOBALAMIN 1000 MCG/ML IJ SOLN
1000.0000 ug | Freq: Once | INTRAMUSCULAR | Status: AC
  Administered 2021-02-08: 1000 ug via INTRAMUSCULAR
  Filled 2021-02-08: qty 1

## 2021-02-08 NOTE — Progress Notes (Signed)
Patient ID: Megan Brewer, female   DOB: [birth year], 75 y.o.   MRN: [id number]  POD #1 - left chest port removal  Incision c/d/I No sign of infection  Will sign off for now.  No follow-up needed unless there are any wound issues.  Please call us back if needed.  Imogene Burn. Georgette Dover, MD, [facility]  General/ Trauma Surgery   02/08/2021 8:26 AM

## 2021-02-08 NOTE — Progress Notes (Addendum)
PROGRESS NOTE  Megan Brewer FOY:[id number] DOB: 1945-12-23 DOA: 02/04/2021 PCP: [name], NP   LOS: 4 days   Brief Narrative / Interim history: 75 year old female with bipolar disorder, lung cancer, C. difficile, hypogammaglobulinemia, hypothyroidism, gastric bypass comes in as a direct admission from the ID clinic with rigors, malaise.  She was recently hospitalized and treated for gram-negative bacteremia and C. difficile infection.  She finished 14 days of cefepime on 6/15, and following that over the course of the last several days she started feeling poorly, fatigue, rigors, generalized weakness and came back to the hospital.  Her diarrhea was better on oral vancomycin however got worse just prior to admission.  Subjective / 24h Interval events: Feeling well this morning.  Had an episode of nausea last night after the intravenous antibiotic  Assessment & Plan: Principal Problem Concern for persistent bacteremia-recently with gram-negative's.  ID consulted and following, she apparently grew Herbspirillum huttience.  Continue meropenem, oral vancomycin per ID.  ID recommends her staying hospitalized until speciation results, currently pending -2D echo without evidence of vegetations -Blood cultures obtained from port 6/22 are growing again gram-negative rods.  She is status post port removal on 6/23, appreciate general surgery help  Active Problems C. difficile diarrhea-continue oral vancomycin  B12 deficiency-give IM B12 x1  History of hypogammaglobinemia-receives Gammagard every 3 weeks, last dose 6/17  Recent Enterococcus UTI-treated  Hypothyroidism-continue Synthroid  Overactive bladder-continue home medications  Urinary retention-continue Flomax  History of gastric bypass-noted  Essential hypertension-continue Norvasc   Scheduled Meds:  amLODipine  10 mg Oral Daily   calcium citrate  200 mg of elemental calcium Oral Daily   Chlorhexidine Gluconate  Cloth  6 each Topical Daily   Chlorhexidine Gluconate Cloth  6 each Topical Q0600   cholecalciferol  1,000 Units Oral Daily   cycloSPORINE  1 drop Both Eyes BID   enoxaparin (LOVENOX) injection  40 mg Subcutaneous Q24H   estradiol  1 Applicatorful Vaginal Once per day on Mon Wed Fri   ferrous sulfate  650 mg Oral Q breakfast   ketotifen  1 drop Both Eyes Daily   lamoTRIgine  200 mg Oral QHS   levothyroxine  75 mcg Oral Q0600   magnesium oxide  400 mg Oral Daily   mirtazapine  30 mg Oral QHS   mupirocin ointment  1 application Nasal BID   oxybutynin  2.5 mg Oral BID   pantoprazole  40 mg Oral Daily   potassium chloride SA  40 mEq Oral Daily   rOPINIRole  1 mg Oral QHS   saccharomyces boulardii  250 mg Oral BID   sodium chloride flush  10-40 mL Intracatheter Q12H   tamsulosin  0.4 mg Oral Daily   vancomycin  125 mg Oral QID   zinc oxide   Topical BID   Continuous Infusions:  meropenem (MERREM) IV 1 g (02/07/21 2127)   PRN Meds:.acetaminophen **OR** acetaminophen, azelastine **AND** fluticasone, baclofen, hydrOXYzine, ondansetron (ZOFRAN) IV, oxyCODONE, polyethylene glycol, promethazine, sodium chloride flush, sucralfate, tiZANidine  Diet Orders (From admission, onward)     Start     Ordered   02/07/21 1326  Diet regular Room service appropriate? Yes; Fluid consistency: Thin  Diet effective now       Question Answer Comment  Room service appropriate? Yes   Fluid consistency: Thin      02/07/21 1325            DVT prophylaxis: enoxaparin (LOVENOX) injection 40 mg Start: 02/04/21 2200  Code Status: Full Code  Family Communication: No family at bedside  Status is: Inpatient  Remains inpatient appropriate because:Inpatient level of care appropriate due to severity of illness  Dispo: The patient is from: Home              Anticipated d/c is to: Home              Patient currently is not medically stable to d/c.   Difficult to place patient No   Level of care:  Telemetry  Consultants:  Infectious disease  Procedures:  2D echo  1. Left ventricular ejection fraction, by estimation, is 60 to 65%. The left ventricle has normal function. The left ventricle has no regional wall motion abnormalities. Left ventricular diastolic parameters are consistent with Grade I diastolic dysfunction (impaired relaxation).   2. Right ventricular systolic function is normal. The right ventricular size is normal. There is normal pulmonary artery systolic pressure.   3. The mitral valve is normal in structure. No evidence of mitral valve regurgitation. No evidence of mitral stenosis.   4. The aortic valve was not well visualized. Aortic valve regurgitation is trivial. No aortic stenosis is present.   5. The inferior vena cava is normal in size with greater than 50% respiratory variability, suggesting right atrial pressure of 3 mmHg.   Microbiology  Blood cultures -gram-negative rods from the port draw  Antimicrobials: Meropenem    Objective: Vitals:   02/07/21 1346 02/07/21 1932 02/08/21 0500 02/08/21 0650  BP: (!) 156/63 97/66  123/66  Pulse: 79 94  67  Resp: 14 18  18   Temp: 97.6 F (36.4 C) 98.2 F (36.8 C)  98.3 F (36.8 C)  TempSrc: Oral Oral  Oral  SpO2: 100% 99%  100%  Weight:   57.1 kg   Height:        Intake/Output Summary (Last 24 hours) at 02/08/2021 1006 Last data filed at 02/08/2021 2505 Gross per 24 hour  Intake 536 ml  Output 1 ml  Net 535 ml    Filed Weights   02/06/21 0500 02/07/21 0500 02/08/21 0500  Weight: 54.7 kg 55.6 kg 57.1 kg    Examination:  Constitutional: NAD, in bed Eyes: No icterus ENMT: mmm Neck: normal, supple Respiratory: Clear bilaterally, no wheezing Cardiovascular: Regular rate and rhythm, no edema Abdomen: Soft, NT, ND, bowel sounds positive Musculoskeletal: no clubbing / cyanosis.  Skin: No rashes seen Neurologic: Nonfocal  Data Reviewed: I have independently reviewed following labs and imaging  studies   CBC: Recent Labs  Lab 02/04/21 1711 02/08/21 0625  WBC 6.2 4.0  NEUTROABS 4.3  --   HGB 12.2 11.9*  HCT 38.5 37.1  MCV 93.0 92.5  PLT 200 147*    Basic Metabolic Panel: Recent Labs  Lab 02/04/21 1711 02/05/21 0310 02/08/21 0625  NA 138 136 139  K 5.4* 3.8 4.7  CL 103 104 105  CO2 28 25 30   GLUCOSE 93 149* 91  BUN 34* 28* 15  CREATININE 1.11* 1.13* 0.89  CALCIUM 8.7* 7.9* 9.1    Liver Function Tests: Recent Labs  Lab 02/04/21 1711 02/05/21 0310  AST 35 36  ALT 32 31  ALKPHOS 78 66  BILITOT 0.3 0.4  PROT 8.1 6.5  ALBUMIN 3.6 2.9*    Coagulation Profile: No results for input(s): INR, PROTIME in the last 168 hours. HbA1C: No results for input(s): HGBA1C in the last 72 hours. CBG: No results for input(s): GLUCAP in the last 168 hours.  Recent Results (from the past 240 hour(s))  Culture, blood (routine x 2)     Status: None (Preliminary result)   Collection Time: 02/04/21  5:12 PM   Specimen: BLOOD  Result Value Ref Range Status   Specimen Description   Final    BLOOD RIGHT ARM Performed at [facility] [address], [place], [place] [address]    Special Requests   Final    BOTTLES DRAWN AEROBIC AND ANAEROBIC Blood Culture adequate volume Performed at [facility] [address] [place], [place] [address]    Culture   Final    NO GROWTH 4 DAYS Performed at [facility], [place] [address], [place], [place] [address]    Report Status PENDING  Incomplete  Culture, blood (routine x 2)     Status: None (Preliminary result)   Collection Time: 02/04/21  5:12 PM   Specimen: BLOOD  Result Value Ref Range Status   Specimen Description   Final    BLOOD LEFT ANTECUBITAL Performed at [facility] [address], [place], [place] [address]    Special Requests   Final    BOTTLES DRAWN AEROBIC ONLY Blood Culture adequate volume Performed at [facility]  [address], [place], [place] [address]    Culture   Final    NO GROWTH 4 DAYS Performed at [facility], [place] [address], [place], [place] [address]    Report Status PENDING  Incomplete  Resp Panel by RT-PCR (Flu A&B, Covid) Nasopharyngeal Swab     Status: None   Collection Time: 02/04/21  5:56 PM   Specimen: Nasopharyngeal Swab; Nasopharyngeal(NP) swabs in vial transport medium  Result Value Ref Range Status   SARS Coronavirus 2 by RT PCR NEGATIVE NEGATIVE Final    Comment: (NOTE) SARS-CoV-2 target nucleic acids are NOT DETECTED.  The SARS-CoV-2 RNA is generally detectable in upper respiratory specimens during the acute phase of infection. The lowest concentration of SARS-CoV-2 viral copies this assay can detect is 138 copies/mL. A negative result does not preclude SARS-Cov-2 infection and should not be used as the sole basis for treatment or other patient management decisions. A negative result may occur with  improper specimen collection/handling, submission of specimen other than nasopharyngeal swab, presence of viral mutation(s) within the areas targeted by this assay, and inadequate number of viral copies(<138 copies/mL). A negative result must be combined with clinical observations, patient history, and epidemiological information. The expected result is Negative.  Fact Sheet for Patients:  EntrepreneurPulse.com.au  Fact Sheet for Healthcare Providers:  IncredibleEmployment.be  This test is no t yet approved or cleared by the [place] FDA and  has been authorized for detection and/or diagnosis of SARS-CoV-2 by FDA under an Emergency Use Authorization (EUA). This EUA will remain  in effect (meaning this test can be used) for the duration of the COVID-19 declaration under Section 564(b)(1) of the Act, 21 U.S.C.section 360bbb-3(b)(1), unless the authorization is terminated  or revoked sooner.       Influenza A by PCR NEGATIVE  NEGATIVE Final   Influenza B by PCR NEGATIVE NEGATIVE Final    Comment: (NOTE) The Xpert Xpress SARS-CoV-2/FLU/RSV plus assay is intended as an aid in the diagnosis of influenza from Nasopharyngeal swab specimens and should not be used as a sole basis for treatment. Nasal washings and aspirates are unacceptable for Xpert Xpress SARS-CoV-2/FLU/RSV testing.  Fact Sheet for Patients: EntrepreneurPulse.com.au  Fact Sheet for Healthcare  Providers: IncredibleEmployment.be  This test is not yet approved or cleared by the [place] and has been authorized for detection and/or diagnosis of SARS-CoV-2 by FDA under an Emergency Use Authorization (EUA). This EUA will remain in effect (meaning this test can be used) for the duration of the COVID-19 declaration under Section 564(b)(1) of the Act, 21 U.S.C. section 360bbb-3(b)(1), unless the authorization is terminated or revoked.  Performed at [facility], [place] [address], [place], [place] [address]   Culture, blood (single)     Status: None (Preliminary result)   Collection Time: 02/04/21  6:37 PM   Specimen: BLOOD  Result Value Ref Range Status   Specimen Description   Final    BLOOD PORTA CATH LEFT CHEST Performed at [facility] [address], [place], [place] [address]    Special Requests   Final    BOTTLES DRAWN AEROBIC AND ANAEROBIC Blood Culture adequate volume Performed at [facility] [address], [place], [place] [address]    Culture  Setup Time   Final    GRAM NEGATIVE RODS IN BOTH AEROBIC AND ANAEROBIC BOTTLES CRITICAL RESULT CALLED TO, READ BACK BY AND VERIFIED WITH: Melodye Ped [facility] 6389 02/05/21 A BROWNING    Culture   Final    GRAM NEGATIVE RODS CULTURE REINCUBATED FOR BETTER GROWTH Performed at [facility], [place] [address], [place], [place] [address]    Report Status PENDING  Incomplete  Blood Culture ID  Panel (Reflexed)     Status: None   Collection Time: 02/04/21  6:37 PM  Result Value Ref Range Status   Enterococcus faecalis NOT DETECTED NOT DETECTED Final   Enterococcus Faecium NOT DETECTED NOT DETECTED Final   Listeria monocytogenes NOT DETECTED NOT DETECTED Final   Staphylococcus species NOT DETECTED NOT DETECTED Final   Staphylococcus aureus (BCID) NOT DETECTED NOT DETECTED Final   Staphylococcus epidermidis NOT DETECTED NOT DETECTED Final   Staphylococcus lugdunensis NOT DETECTED NOT DETECTED Final   Streptococcus species NOT DETECTED NOT DETECTED Final   Streptococcus agalactiae NOT DETECTED NOT DETECTED Final   Streptococcus pneumoniae NOT DETECTED NOT DETECTED Final   Streptococcus pyogenes NOT DETECTED NOT DETECTED Final   A.calcoaceticus-baumannii NOT DETECTED NOT DETECTED Final   Bacteroides fragilis NOT DETECTED NOT DETECTED Final   Enterobacterales NOT DETECTED NOT DETECTED Final   Enterobacter cloacae complex NOT DETECTED NOT DETECTED Final   Escherichia coli NOT DETECTED NOT DETECTED Final   Klebsiella aerogenes NOT DETECTED NOT DETECTED Final   Klebsiella oxytoca NOT DETECTED NOT DETECTED Final   Klebsiella pneumoniae NOT DETECTED NOT DETECTED Final   Proteus species NOT DETECTED NOT DETECTED Final   Salmonella species NOT DETECTED NOT DETECTED Final   Serratia marcescens NOT DETECTED NOT DETECTED Final   Haemophilus influenzae NOT DETECTED NOT DETECTED Final   Neisseria meningitidis NOT DETECTED NOT DETECTED Final   Pseudomonas aeruginosa NOT DETECTED NOT DETECTED Final   Stenotrophomonas maltophilia NOT DETECTED NOT DETECTED Final   Candida albicans NOT DETECTED NOT DETECTED Final   Candida auris NOT DETECTED NOT DETECTED Final   Candida glabrata NOT DETECTED NOT DETECTED Final   Candida krusei NOT DETECTED NOT DETECTED Final   Candida parapsilosis NOT DETECTED NOT DETECTED Final   Candida tropicalis NOT DETECTED NOT DETECTED Final   Cryptococcus  neoformans/gattii NOT DETECTED NOT DETECTED Final    Comment: Performed at [facility], [address] [place], [place] [address]  Culture, Urine  Status: None   Collection Time: 02/05/21  2:00 AM   Specimen: Urine, Clean Catch  Result Value Ref Range Status   Specimen Description   Final    URINE, CLEAN CATCH Performed at [facility], [place] [address], [place], [place] [address]    Special Requests   Final    NONE Performed at [facility], [place] [address], [place], [place] [address]    Culture   Final    NO GROWTH Performed at [facility], [place] [address], [place], [place] [address]    Report Status 02/06/2021 FINAL  Final  Surgical pcr screen     Status: Abnormal   Collection Time: 02/07/21  8:03 AM   Specimen: Nasal Mucosa; Nasal Swab  Result Value Ref Range Status   MRSA, PCR NEGATIVE NEGATIVE Final   Staphylococcus aureus POSITIVE (A) NEGATIVE Final    Comment: (NOTE) The Xpert SA Assay (FDA approved for NASAL specimens in patients 83 years of age and older), is one component of a comprehensive surveillance program. It is not intended to diagnose infection nor to guide or monitor treatment. Performed at [facility], [place] [address], [place],  [address]       Radiology Studies: No results found.  Marzetta Board, MD, PhD [facility]  Between 7 am - 7 pm I am available, please contact me via Amion (for emergencies) or Securechat (non urgent messages)  Between 7 pm - 7 am I am not available, please contact night coverage MD/APP via Amion

## 2021-02-09 LAB — CULTURE, BLOOD (ROUTINE X 2)
Culture: NO GROWTH
Culture: NO GROWTH
Special Requests: ADEQUATE
Special Requests: ADEQUATE

## 2021-02-09 MED ORDER — ROPINIROLE HCL 1 MG PO TABS
1.0000 mg | ORAL_TABLET | Freq: Once | ORAL | Status: AC
  Administered 2021-02-09: 1 mg via ORAL
  Filled 2021-02-09: qty 1

## 2021-02-09 NOTE — Progress Notes (Signed)
PROGRESS NOTE  Megan Brewer UXN:[id number] DOB: 14-Oct-1945 DOA: 02/04/2021 PCP: [name], NP   LOS: 5 days   Brief Narrative / Interim history: 75 year old female with bipolar disorder, lung cancer, C. difficile, hypogammaglobulinemia, hypothyroidism, gastric bypass comes in as a direct admission from the ID clinic with rigors, malaise.  She was recently hospitalized and treated for gram-negative bacteremia and C. difficile infection.  She finished 14 days of cefepime on 6/15, and following that over the course of the last several days she started feeling poorly, fatigue, rigors, generalized weakness and came back to the hospital.  Her diarrhea was better on oral vancomycin however got worse just prior to admission.  Subjective / 24h Interval events: Feeling well this morning, feels like her diarrhea got a little bit worse overnight.  Also with intermittent nausea   Assessment & Plan: Principal Problem Concern for persistent bacteremia-recently with gram-negative's.  ID consulted and following, she apparently grew Herbspirillum huttience.  Continue meropenem, oral vancomycin per ID.  ID recommends her staying hospitalized until speciation results, currently pending -2D echo without evidence of vegetations -Blood cultures obtained from port 6/22 are growing again gram-negative rods.  She is status post port removal on 6/23, surgical site clean  Active Problems C. difficile diarrhea-continue oral vancomycin  B12 deficiency-give IM B12 x1 on 6/25, will need oral supplementation on discharge  History of hypogammaglobinemia-receives Gammagard every 3 weeks, last dose 6/17  Recent Enterococcus UTI-treated  Hypothyroidism-continue Synthroid  Overactive bladder-continue home medications  Urinary retention-continue Flomax  History of gastric bypass-noted  Essential hypertension-continue Norvasc   Scheduled Meds:  amLODipine  10 mg Oral Daily   calcium citrate  200 mg  of elemental calcium Oral Daily   Chlorhexidine Gluconate Cloth  6 each Topical Daily   Chlorhexidine Gluconate Cloth  6 each Topical Q0600   cholecalciferol  1,000 Units Oral Daily   cycloSPORINE  1 drop Both Eyes BID   enoxaparin (LOVENOX) injection  40 mg Subcutaneous Q24H   estradiol  1 Applicatorful Vaginal Once per day on Mon Wed Fri   ferrous sulfate  650 mg Oral Q breakfast   ketotifen  1 drop Both Eyes Daily   lamoTRIgine  200 mg Oral QHS   levothyroxine  75 mcg Oral Q0600   magnesium oxide  400 mg Oral Daily   mirtazapine  30 mg Oral QHS   mupirocin ointment  1 application Nasal BID   oxybutynin  2.5 mg Oral BID   pantoprazole  40 mg Oral Daily   potassium chloride SA  40 mEq Oral Daily   rOPINIRole  1 mg Oral QHS   saccharomyces boulardii  250 mg Oral BID   sodium chloride flush  10-40 mL Intracatheter Q12H   tamsulosin  0.4 mg Oral Daily   vancomycin  125 mg Oral QID   zinc oxide   Topical BID   Continuous Infusions:  meropenem (MERREM) IV 1 g (02/09/21 0938)   PRN Meds:.acetaminophen **OR** acetaminophen, azelastine **AND** fluticasone, baclofen, hydrOXYzine, ondansetron (ZOFRAN) IV, oxyCODONE, polyethylene glycol, promethazine, sodium chloride flush, sucralfate, tiZANidine  Diet Orders (From admission, onward)     Start     Ordered   02/07/21 1326  Diet regular Room service appropriate? Yes; Fluid consistency: Thin  Diet effective now       Question Answer Comment  Room service appropriate? Yes   Fluid consistency: Thin      02/07/21 1325            DVT  prophylaxis: enoxaparin (LOVENOX) injection 40 mg Start: 02/04/21 2200     Code Status: Full Code  Family Communication: No family at bedside  Status is: Inpatient  Remains inpatient appropriate because:Inpatient level of care appropriate due to severity of illness  Dispo: The patient is from: Home              Anticipated d/c is to: Home              Patient currently is not medically stable to  d/c.   Difficult to place patient No   Level of care: Telemetry  Consultants:  Infectious disease  Procedures:  2D echo  1. Left ventricular ejection fraction, by estimation, is 60 to 65%. The left ventricle has normal function. The left ventricle has no regional wall motion abnormalities. Left ventricular diastolic parameters are consistent with Grade I diastolic dysfunction (impaired relaxation).   2. Right ventricular systolic function is normal. The right ventricular size is normal. There is normal pulmonary artery systolic pressure.   3. The mitral valve is normal in structure. No evidence of mitral valve regurgitation. No evidence of mitral stenosis.   4. The aortic valve was not well visualized. Aortic valve regurgitation is trivial. No aortic stenosis is present.   5. The inferior vena cava is normal in size with greater than 50% respiratory variability, suggesting right atrial pressure of 3 mmHg.   Microbiology  Blood cultures -gram-negative rods from the port draw  Antimicrobials: Meropenem    Objective: Vitals:   02/08/21 1421 02/08/21 1942 02/09/21 0311 02/09/21 0500  BP: (!) 119/52 119/60 135/71   Pulse: 79 88 73   Resp: 18 18 15    Temp: 98 F (36.7 C) 98.4 F (36.9 C) 97.9 F (36.6 C)   TempSrc: Oral Oral Oral   SpO2: 100% 100% 100%   Weight:    58.2 kg  Height:        Intake/Output Summary (Last 24 hours) at 02/09/2021 1009 Last data filed at 02/08/2021 1723 Gross per 24 hour  Intake 478 ml  Output --  Net 478 ml    Filed Weights   02/07/21 0500 02/08/21 0500 02/09/21 0500  Weight: 55.6 kg 57.1 kg 58.2 kg    Examination:  Constitutional: No distress, in bed Eyes: No scleral icterus ENMT: mmm Neck: normal, supple Respiratory: Clear to auscultation bilaterally, no wheezing heard Cardiovascular: Regular rate and rhythm, no murmurs, no edema Abdomen: Soft, nontender, nondistended, bowel sounds positive Musculoskeletal: no clubbing / cyanosis.   Skin: No rashes Neurologic: No focal deficits  Data Reviewed: I have independently reviewed following labs and imaging studies   CBC: Recent Labs  Lab 02/04/21 1711 02/08/21 0625  WBC 6.2 4.0  NEUTROABS 4.3  --   HGB 12.2 11.9*  HCT 38.5 37.1  MCV 93.0 92.5  PLT 200 147*    Basic Metabolic Panel: Recent Labs  Lab 02/04/21 1711 02/05/21 0310 02/08/21 0625  NA 138 136 139  K 5.4* 3.8 4.7  CL 103 104 105  CO2 28 25 30   GLUCOSE 93 149* 91  BUN 34* 28* 15  CREATININE 1.11* 1.13* 0.89  CALCIUM 8.7* 7.9* 9.1    Liver Function Tests: Recent Labs  Lab 02/04/21 1711 02/05/21 0310  AST 35 36  ALT 32 31  ALKPHOS 78 66  BILITOT 0.3 0.4  PROT 8.1 6.5  ALBUMIN 3.6 2.9*    Coagulation Profile: No results for input(s): INR, PROTIME in the last 168 hours. HbA1C: No results  for input(s): HGBA1C in the last 72 hours. CBG: No results for input(s): GLUCAP in the last 168 hours.  Recent Results (from the past 240 hour(s))  Culture, blood (routine x 2)     Status: None   Collection Time: 02/04/21  5:12 PM   Specimen: BLOOD  Result Value Ref Range Status   Specimen Description   Final    BLOOD RIGHT ARM Performed at [facility] [address], [place], [place] [address]    Special Requests   Final    BOTTLES DRAWN AEROBIC AND ANAEROBIC Blood Culture adequate volume Performed at [facility] [address] [place], [place] [address]    Culture   Final    NO GROWTH 5 DAYS Performed at [facility], [place] [address], [place], [place] [address]    Report Status 02/09/2021 FINAL  Final  Culture, blood (routine x 2)     Status: None   Collection Time: 02/04/21  5:12 PM   Specimen: BLOOD  Result Value Ref Range Status   Specimen Description   Final    BLOOD LEFT ANTECUBITAL Performed at [facility] [address] [place], [place] [address]    Special Requests   Final    BOTTLES DRAWN AEROBIC ONLY  Blood Culture adequate volume Performed at [facility] [address] [place], [place] [address]    Culture   Final    NO GROWTH 5 DAYS Performed at [facility], [place] [address] [place], [place] [address]    Report Status 02/09/2021 FINAL  Final  Resp Panel by RT-PCR (Flu A&B, Covid) Nasopharyngeal Swab     Status: None   Collection Time: 02/04/21  5:56 PM   Specimen: Nasopharyngeal Swab; Nasopharyngeal(NP) swabs in vial transport medium  Result Value Ref Range Status   SARS Coronavirus 2 by RT PCR NEGATIVE NEGATIVE Final    Comment: (NOTE) SARS-CoV-2 target nucleic acids are NOT DETECTED.  The SARS-CoV-2 RNA is generally detectable in upper respiratory specimens during the acute phase of infection. The lowest concentration of SARS-CoV-2 viral copies this assay can detect is 138 copies/mL. A negative result does not preclude SARS-Cov-2 infection and should not be used as the sole basis for treatment or other patient management decisions. A negative result may occur with  improper specimen collection/handling, submission of specimen other than nasopharyngeal swab, presence of viral mutation(s) within the areas targeted by this assay, and inadequate number of viral copies(<138 copies/mL). A negative result must be combined with clinical observations, patient history, and epidemiological information. The expected result is Negative.  Fact Sheet for Patients:  EntrepreneurPulse.com.au  Fact Sheet for Healthcare Providers:  IncredibleEmployment.be  This test is no t yet approved or cleared by the [place] FDA and  has been authorized for detection and/or diagnosis of SARS-CoV-2 by FDA under an Emergency Use Authorization (EUA). This EUA will remain  in effect (meaning this test can be used) for the duration of the COVID-19 declaration under Section 564(b)(1) of the Act, 21 U.S.C.section 360bbb-3(b)(1), unless the  authorization is terminated  or revoked sooner.       Influenza A by PCR NEGATIVE NEGATIVE Final   Influenza B by PCR NEGATIVE NEGATIVE Final    Comment: (NOTE) The Xpert Xpress SARS-CoV-2/FLU/RSV plus assay is intended as an aid in the diagnosis of influenza from Nasopharyngeal swab specimens and should not be used as a sole basis for treatment. Nasal washings and aspirates  are unacceptable for Xpert Xpress SARS-CoV-2/FLU/RSV testing.  Fact Sheet for Patients: EntrepreneurPulse.com.au  Fact Sheet for Healthcare Providers: IncredibleEmployment.be  This test is not yet approved or cleared by the [place] FDA and has been authorized for detection and/or diagnosis of SARS-CoV-2 by FDA under an Emergency Use Authorization (EUA). This EUA will remain in effect (meaning this test can be used) for the duration of the COVID-19 declaration under Section 564(b)(1) of the Act, 21 U.S.C. section 360bbb-3(b)(1), unless the authorization is terminated or revoked.  Performed at [facility], [place] [address], [place], [place] [address]   Culture, blood (single)     Status: None (Preliminary result)   Collection Time: 02/04/21  6:37 PM   Specimen: BLOOD  Result Value Ref Range Status   Specimen Description   Final    BLOOD PORTA CATH LEFT CHEST Performed at [facility] [address] [place], [place] [address]    Special Requests   Final    BOTTLES DRAWN AEROBIC AND ANAEROBIC Blood Culture adequate volume Performed at [facility] [address], [place], [place] [address]    Culture  Setup Time   Final    GRAM NEGATIVE RODS IN BOTH AEROBIC AND ANAEROBIC BOTTLES CRITICAL RESULT CALLED TO, READ BACK BY AND VERIFIED WITH: Melodye Ped [facility] 2130 02/05/21 A BROWNING    Culture   Final    GRAM NEGATIVE RODS SENT TO [facility] FOR IDENTIFICATION Sent to [facility] for further susceptibility  testing. Performed at [facility], [place] [address] [place], [place] [address]    Report Status PENDING  Incomplete  Blood Culture ID Panel (Reflexed)     Status: None   Collection Time: 02/04/21  6:37 PM  Result Value Ref Range Status   Enterococcus faecalis NOT DETECTED NOT DETECTED Final   Enterococcus Faecium NOT DETECTED NOT DETECTED Final   Listeria monocytogenes NOT DETECTED NOT DETECTED Final   Staphylococcus species NOT DETECTED NOT DETECTED Final   Staphylococcus aureus (BCID) NOT DETECTED NOT DETECTED Final   Staphylococcus epidermidis NOT DETECTED NOT DETECTED Final   Staphylococcus lugdunensis NOT DETECTED NOT DETECTED Final   Streptococcus species NOT DETECTED NOT DETECTED Final   Streptococcus agalactiae NOT DETECTED NOT DETECTED Final   Streptococcus pneumoniae NOT DETECTED NOT DETECTED Final   Streptococcus pyogenes NOT DETECTED NOT DETECTED Final   A.calcoaceticus-baumannii NOT DETECTED NOT DETECTED Final   Bacteroides fragilis NOT DETECTED NOT DETECTED Final   Enterobacterales NOT DETECTED NOT DETECTED Final   Enterobacter cloacae complex NOT DETECTED NOT DETECTED Final   Escherichia coli NOT DETECTED NOT DETECTED Final   Klebsiella aerogenes NOT DETECTED NOT DETECTED Final   Klebsiella oxytoca NOT DETECTED NOT DETECTED Final   Klebsiella pneumoniae NOT DETECTED NOT DETECTED Final   Proteus species NOT DETECTED NOT DETECTED Final   Salmonella species NOT DETECTED NOT DETECTED Final   Serratia marcescens NOT DETECTED NOT DETECTED Final   Haemophilus influenzae NOT DETECTED NOT DETECTED Final   Neisseria meningitidis NOT DETECTED NOT DETECTED Final   Pseudomonas aeruginosa NOT DETECTED NOT DETECTED Final   Stenotrophomonas maltophilia NOT DETECTED NOT DETECTED Final   Candida albicans NOT DETECTED NOT DETECTED Final   Candida auris NOT DETECTED NOT DETECTED Final   Candida glabrata NOT DETECTED NOT DETECTED Final   Candida krusei NOT DETECTED NOT DETECTED  Final   Candida parapsilosis NOT DETECTED NOT DETECTED Final   Candida tropicalis NOT DETECTED NOT DETECTED Final   Cryptococcus neoformans/gattii NOT  DETECTED NOT DETECTED Final    Comment: Performed at [facility], [place] [address], [place], [place] [address]  Culture, Urine     Status: None   Collection Time: 02/05/21  2:00 AM   Specimen: Urine, Clean Catch  Result Value Ref Range Status   Specimen Description   Final    URINE, CLEAN CATCH Performed at [facility], [place] [address], [place], [place] [address]    Special Requests   Final    NONE Performed at [facility], [place] [address] [place], [place] [address]    Culture   Final    NO GROWTH Performed at [facility], [place] [address] [place], [place] [address]    Report Status 02/06/2021 FINAL  Final  Surgical pcr screen     Status: Abnormal   Collection Time: 02/07/21  8:03 AM   Specimen: Nasal Mucosa; Nasal Swab  Result Value Ref Range Status   MRSA, PCR NEGATIVE NEGATIVE Final   Staphylococcus aureus POSITIVE (A) NEGATIVE Final    Comment: (NOTE) The Xpert SA Assay (FDA approved for NASAL specimens in patients 4 years of age and older), is one component of a comprehensive surveillance program. It is not intended to diagnose infection nor to guide or monitor treatment. Performed at [facility], [place] [address], [place], [place] [address]       Radiology Studies: No results found.  Marzetta Board, MD, PhD [facility]  Between 7 am - 7 pm I am available, please contact me via Amion (for emergencies) or Securechat (non urgent messages)  Between 7 pm - 7 am I am not available, please contact night coverage MD/APP via Amion

## 2021-02-09 NOTE — Progress Notes (Signed)
Per pt request--PLEASE give Zofran IV prior to hanging IV antibiotic, as the antibiotic makes her so nauseous.

## 2021-02-10 LAB — CBC
HCT: 35.2 % — ABNORMAL LOW (ref 36.0–46.0)
Hemoglobin: 11.3 g/dL — ABNORMAL LOW (ref 12.0–15.0)
MCH: 29.7 pg (ref 26.0–34.0)
MCHC: 32.1 g/dL (ref 30.0–36.0)
MCV: 92.6 fL (ref 80.0–100.0)
Platelets: 147 10*3/uL — ABNORMAL LOW (ref 150–400)
RBC: 3.8 MIL/uL — ABNORMAL LOW (ref 3.87–5.11)
RDW: 13.4 % (ref 11.5–15.5)
WBC: 3.2 10*3/uL — ABNORMAL LOW (ref 4.0–10.5)
nRBC: 0 % (ref 0.0–0.2)

## 2021-02-10 LAB — BASIC METABOLIC PANEL
Anion gap: 5 (ref 5–15)
BUN: 14 mg/dL (ref 8–23)
CO2: 26 mmol/L (ref 22–32)
Calcium: 8.8 mg/dL — ABNORMAL LOW (ref 8.9–10.3)
Chloride: 109 mmol/L (ref 98–111)
Creatinine, Ser: 0.68 mg/dL (ref 0.44–1.00)
GFR, Estimated: >60 mL/min (ref >60)
Glucose, Bld: 88 mg/dL (ref 70–99)
Potassium: 4.4 mmol/L (ref 3.5–5.1)
Sodium: 140 mmol/L (ref 135–145)

## 2021-02-10 LAB — SURGICAL PATHOLOGY

## 2021-02-10 MED ORDER — DIPHENHYDRAMINE HCL 50 MG/ML IJ SOLN
12.5000 mg | Freq: Once | INTRAMUSCULAR | Status: AC
  Administered 2021-02-10: 12.5 mg via INTRAVENOUS
  Filled 2021-02-10: qty 1

## 2021-02-10 MED ORDER — CLOTRIMAZOLE 1 % VA CREA
1.0000 | TOPICAL_CREAM | Freq: Every day | VAGINAL | Status: DC
  Administered 2021-02-10 – 2021-02-12 (×4): 1 via VAGINAL
  Filled 2021-02-10: qty 45

## 2021-02-10 MED ORDER — DIPHENHYDRAMINE HCL 25 MG PO CAPS
25.0000 mg | ORAL_CAPSULE | Freq: Four times a day (QID) | ORAL | Status: DC | PRN
  Administered 2021-02-10 – 2021-02-11 (×3): 25 mg via ORAL
  Filled 2021-02-10 (×3): qty 1

## 2021-02-10 NOTE — Progress Notes (Addendum)
[facility] Infectious Diseases Follow Up Note  Patient Identification: Patient Name: Megan Brewer MRN: [id number] [place] Date: 02/04/2021  4:43 PM Age: 75 y.o.Today's Date: 02/10/2021   Reason for Visit: Gram-negative bacteremia and C. difficile diarrhea  Active Problems:   Rigors   Antibiotics: PO Vancomycin 6/22 -current                    cefepime 6/21                    Meropenem 6/22-current   Lines/Tubes: Port-A-Cath has been removed  Interval Events: Afebrile, no leukocytosis, diarrhea is worsening   Assessment Recent gram-negative/Gram variable bacteremia status post 2 weeks of cefepime. Sensitivities still pending and was sent to [facility]   Gram-negative bacteremia this admission status post implanted port removal on 6/24 Port cultures growing gram-negative rods, ID and sensitivities pending. TTE is negative for vegetations  C. difficile diarrhea(second episode) -worsening frequency and watery given that patient is on broad-spectrum antibiotics   Recommendations Continue meropenem, will plan to treat it for 7 days from date of port removal on 6/24  Tentative end date 7/1 if she continues to improve clinically. Sample has been sent to [facility] for identification and sensitivities.  ID pharmacist Dorothea Ogle will continue to follow on that.  Continue p.o. vancomycin at current dosing.  I think her increasing frequency is related to her being on broad-spectrum antibiotics and have to be judicious about the duration of IV antibiotics for her GNR bacteremia. Her white count and creatinine are within normal limit.  Her abdomen is soft and tender but no rebound tenderness or guarding.  Will reevaluate tomorrow  Rest of the management as per the primary team. Thank you for the consult. Please page with pertinent questions or concerns.  ______________________________________________________________________ Subjective patient  seen and examined at the bedside.  She is complaining of 5 episodes of watery loose stool since 6:30 AM to 9:30 AM.  Denies any fevers, chills and sweats.  Also has abdominal pain and cramps.  Denies any nausea vomiting.   Vitals BP 117/65 (BP Location: Right Arm)   Pulse 71   Temp 97.9 F (36.6 C)   Resp 17   Ht 5\' 2"  (1.575 m)   Wt 58.2 kg   SpO2 100%   BMI 23.47 kg/m     Physical Exam Constitutional: Not in acute distress, sitting up in bed    Comments:   Cardiovascular:     Rate and Rhythm: Normal rate and regular rhythm.     Heart sounds:  Pulmonary:     Effort: Pulmonary effort is normal.     Comments: Lung sounds bilaterally  Abdominal:     Palpations: Abdomen is soft.     Tenderness: Tender in the upper abdomen, hyperactive bowel sounds, no rebound tenderness and guarding  Musculoskeletal:        General: No swelling or tenderness.   Skin:    Comments: No lesions or rashes  Neurological:     General: No focal deficit present.   Psychiatric:        Mood and Affect: Mood normal.    Pertinent Microbiology Results for orders placed or performed during the hospital encounter of 02/04/21  Culture, blood (routine x 2)     Status: None   Collection Time: 02/04/21  5:12 PM   Specimen: BLOOD  Result Value Ref Range Status   Specimen Description   Final    BLOOD RIGHT ARM Performed  at [facility], [place] [address] [place], [place] [address]    Special Requests   Final    BOTTLES DRAWN AEROBIC AND ANAEROBIC Blood Culture adequate volume Performed at [place] [address] [place], [place] [address]    Culture   Final    NO GROWTH 5 DAYS Performed at [facility], [place] [address] [place], [place] [address]    Report Status 02/09/2021 FINAL  Final  Culture, blood (routine x 2)     Status: None   Collection Time: 02/04/21  5:12 PM   Specimen: BLOOD  Result Value Ref Range Status   Specimen Description    Final    BLOOD LEFT ANTECUBITAL Performed at [place] [address] [place], [place] [address]    Special Requests   Final    BOTTLES DRAWN AEROBIC ONLY Blood Culture adequate volume Performed at [place] [address] [place], [place] [address]    Culture   Final    NO GROWTH 5 DAYS Performed at [facility], [place] [address], [place],  [address]    Report Status 02/09/2021 FINAL  Final  Resp Panel by RT-PCR (Flu A&B, Covid) Nasopharyngeal Swab     Status: None   Collection Time: 02/04/21  5:56 PM   Specimen: Nasopharyngeal Swab; Nasopharyngeal(NP) swabs in vial transport medium  Result Value Ref Range Status   SARS Coronavirus 2 by RT PCR NEGATIVE NEGATIVE Final    Comment: (NOTE) SARS-CoV-2 target nucleic acids are NOT DETECTED.  The SARS-CoV-2 RNA is generally detectable in upper respiratory specimens during the acute phase of infection. The lowest concentration of SARS-CoV-2 viral copies this assay can detect is 138 copies/mL. A negative result does not preclude SARS-Cov-2 infection and should not be used as the sole basis for treatment or other patient management decisions. A negative result may occur with  improper specimen collection/handling, submission of specimen other than nasopharyngeal swab, presence of viral mutation(s) within the areas targeted by this assay, and inadequate number of viral copies(<138 copies/mL). A negative result must be combined with clinical observations, patient history, and epidemiological information. The expected result is Negative.  Fact Sheet for Patients:  EntrepreneurPulse.com.au  Fact Sheet for Healthcare Providers:  IncredibleEmployment.be  This test is no t yet approved or cleared by the [place] FDA and  has been authorized for detection and/or diagnosis of SARS-CoV-2 by FDA under an Emergency Use Authorization (EUA). This EUA  will remain  in effect (meaning this test can be used) for the duration of the COVID-19 declaration under Section 564(b)(1) of the Act, 21 U.S.C.section 360bbb-3(b)(1), unless the authorization is terminated  or revoked sooner.       Influenza A by PCR NEGATIVE NEGATIVE Final   Influenza B by PCR NEGATIVE NEGATIVE Final    Comment: (NOTE) The Xpert Xpress SARS-CoV-2/FLU/RSV plus assay is intended as an aid in the diagnosis of influenza from Nasopharyngeal swab specimens and should not be used as a sole basis for treatment. Nasal washings and aspirates are unacceptable for Xpert Xpress SARS-CoV-2/FLU/RSV testing.  Fact Sheet for Patients: EntrepreneurPulse.com.au  Fact Sheet for Healthcare Providers: IncredibleEmployment.be  This test is not yet approved or cleared by the [place] FDA and has been authorized for detection and/or diagnosis of SARS-CoV-2 by FDA under an Emergency Use Authorization (EUA). This EUA will remain in effect (meaning this test can be used) for the duration of the COVID-19 declaration  under Section 564(b)(1) of the Act, 21 U.S.C. section 360bbb-3(b)(1), unless the authorization is terminated or revoked.  Performed at [facility], [place] [address], [place], [place] [address]   Culture, blood (single)     Status: None (Preliminary result)   Collection Time: 02/04/21  6:37 PM   Specimen: BLOOD  Result Value Ref Range Status   Specimen Description   Final    BLOOD PORTA CATH LEFT CHEST Performed at [place] [address], [place], [place] [address]    Special Requests   Final    BOTTLES DRAWN AEROBIC AND ANAEROBIC Blood Culture adequate volume Performed at [place] [address], [place], [place] [address]    Culture  Setup Time   Final    GRAM NEGATIVE RODS IN BOTH AEROBIC AND ANAEROBIC BOTTLES CRITICAL RESULT CALLED TO, READ BACK BY AND  VERIFIED WITH: Melodye Ped [facility] 5852 02/05/21 A BROWNING    Culture   Final    GRAM NEGATIVE RODS SENT TO [facility] FOR IDENTIFICATION Sent to [place] [facility] for further susceptibility testing. Performed at [facility], [place] [address], [place], [place] [address]    Report Status PENDING  Incomplete  Blood Culture ID Panel (Reflexed)     Status: None   Collection Time: 02/04/21  6:37 PM  Result Value Ref Range Status   Enterococcus faecalis NOT DETECTED NOT DETECTED Final   Enterococcus Faecium NOT DETECTED NOT DETECTED Final   Listeria monocytogenes NOT DETECTED NOT DETECTED Final   Staphylococcus species NOT DETECTED NOT DETECTED Final   Staphylococcus aureus (BCID) NOT DETECTED NOT DETECTED Final   Staphylococcus epidermidis NOT DETECTED NOT DETECTED Final   Staphylococcus lugdunensis NOT DETECTED NOT DETECTED Final   Streptococcus species NOT DETECTED NOT DETECTED Final   Streptococcus agalactiae NOT DETECTED NOT DETECTED Final   Streptococcus pneumoniae NOT DETECTED NOT DETECTED Final   Streptococcus pyogenes NOT DETECTED NOT DETECTED Final   A.calcoaceticus-baumannii NOT DETECTED NOT DETECTED Final   Bacteroides fragilis NOT DETECTED NOT DETECTED Final   Enterobacterales NOT DETECTED NOT DETECTED Final   Enterobacter cloacae complex NOT DETECTED NOT DETECTED Final   Escherichia coli NOT DETECTED NOT DETECTED Final   Klebsiella aerogenes NOT DETECTED NOT DETECTED Final   Klebsiella oxytoca NOT DETECTED NOT DETECTED Final   Klebsiella pneumoniae NOT DETECTED NOT DETECTED Final   Proteus species NOT DETECTED NOT DETECTED Final   Salmonella species NOT DETECTED NOT DETECTED Final   Serratia marcescens NOT DETECTED NOT DETECTED Final   Haemophilus influenzae NOT DETECTED NOT DETECTED Final   Neisseria meningitidis NOT DETECTED NOT DETECTED Final   Pseudomonas aeruginosa NOT DETECTED NOT DETECTED Final   Stenotrophomonas maltophilia NOT DETECTED NOT DETECTED Final   Candida albicans  NOT DETECTED NOT DETECTED Final   Candida auris NOT DETECTED NOT DETECTED Final   Candida glabrata NOT DETECTED NOT DETECTED Final   Candida krusei NOT DETECTED NOT DETECTED Final   Candida parapsilosis NOT DETECTED NOT DETECTED Final   Candida tropicalis NOT DETECTED NOT DETECTED Final   Cryptococcus neoformans/gattii NOT DETECTED NOT DETECTED Final    Comment: Performed at [facility], [address], [place], [place] [address]  Culture, Urine     Status: None   Collection Time: 02/05/21  2:00 AM   Specimen: Urine, Clean Catch  Result Value Ref Range Status   Specimen Description   Final    URINE, CLEAN CATCH Performed at [facility], [place]  [address] [place], [place] [address]    Special Requests   Final    NONE Performed at [facility], [place] [address] [place], [place] [address]    Culture   Final    NO GROWTH Performed at [facility], [place] [address], [place], [place] [address]    Report Status 02/06/2021 FINAL  Final  Surgical pcr screen     Status: Abnormal   Collection Time: 02/07/21  8:03 AM   Specimen: Nasal Mucosa; Nasal Swab  Result Value Ref Range Status   MRSA, PCR NEGATIVE NEGATIVE Final   Staphylococcus aureus POSITIVE (A) NEGATIVE Final    Comment: (NOTE) The Xpert SA Assay (FDA approved for NASAL specimens in patients 15 years of age and older), is one component of a comprehensive surveillance program. It is not intended to diagnose infection nor to guide or monitor treatment. Performed at [facility], [place] [address] [place], [place] [address]     Pertinent Lab. CBC Latest Ref Rng & Units 02/10/2021 02/08/2021 02/04/2021  WBC 4.0 - 10.5 K/uL 3.2(L) 4.0 6.2  Hemoglobin 12.0 - 15.0 g/dL 11.3(L) 11.9(L) 12.2  Hematocrit 36.0 - 46.0 % 35.2(L) 37.1 38.5  Platelets 150 - 400 K/uL 147(L) 147(L) 200   CMP Latest Ref Rng & Units 02/10/2021 02/08/2021 02/05/2021  Glucose 70 - 99 mg/dL 88 91  149(H)  BUN 8 - 23 mg/dL 14 15 28(H)  Creatinine 0.44 - 1.00 mg/dL 0.68 0.89 1.13(H)  Sodium 135 - 145 mmol/L 140 139 136  Potassium 3.5 - 5.1 mmol/L 4.4 4.7 3.8  Chloride 98 - 111 mmol/L 109 105 104  CO2 22 - 32 mmol/L 26 30 25   Calcium 8.9 - 10.3 mg/dL 8.8(L) 9.1 7.9(L)  Total Protein 6.5 - 8.1 g/dL - - 6.5  Total Bilirubin 0.3 - 1.2 mg/dL - - 0.4  Alkaline Phos 38 - 126 U/L - - 66  AST 15 - 41 U/L - - 36  ALT 0 - 44 U/L - - 31     Pertinent Imaging today Plain films and CT images have been personally visualized and interpreted; radiology reports have been reviewed. Decision making incorporated into the Impression / Recommendations.  I have spent more than 35 minutes for this patient encounter including review of prior medical records, coordination of care  with greater than 50% of time being face to face/counseling and discussing diagnostics/treatment plan with the patient/family.  Electronically signed by:   Rosiland Oz, MD Infectious Disease Physician [facility] for Infectious Disease Pager: [phone]

## 2021-02-10 NOTE — Progress Notes (Signed)
Pt is c/o severe vaginal itching- requesting Monostat or something like this as she is on multiple antibiotics.

## 2021-02-10 NOTE — Progress Notes (Signed)
PROGRESS NOTE  Megan Brewer ZOX:[id number] DOB: 04-29-1946 DOA: 02/04/2021 PCP: [name], NP   LOS: 6 days   Brief Narrative / Interim history: 75 year old female with bipolar disorder, lung cancer, C. difficile, hypogammaglobulinemia, hypothyroidism, gastric bypass comes in as a direct admission from the ID clinic with rigors, malaise.  She was recently hospitalized and treated for gram-negative bacteremia and C. difficile infection.  She finished 14 days of cefepime on 6/15, and following that over the course of the last several days she started feeling poorly, fatigue, rigors, generalized weakness and came back to the hospital.  Her diarrhea was better on oral vancomycin however got worse just prior to admission.  Subjective / 24h Interval events: She tells me that her diarrhea is watery again and has abdominal cramping starting this morning  Assessment & Plan: Principal Problem Concern for persistent bacteremia-recently with gram-negative's.  ID consulted and following, she apparently grew Herbspirillum huttience.  Continue meropenem, oral vancomycin per ID.  ID recommends 7 days of intravenous antibiotics from port removal on 6/24 -2D echo without evidence of vegetations -Source for persistent bacteremia was port given positive blood cultures with gram-negative rods from the port draw.  General surgery consulted and port was removed on 6/24  Active Problems C. difficile diarrhea-continue oral vancomycin.  Slightly worse today with increased abdominal cramping, defer to ID increasing vancomycin or addition of IV metronidazole  B12 deficiency-give IM B12 x1 on 6/25, will need oral supplementation on discharge  History of hypogammaglobinemia-receives Gammagard every 3 weeks, last dose 6/17  Recent Enterococcus UTI-treated  Hypothyroidism-continue Synthroid  Overactive bladder-continue home medications  Urinary retention-continue Flomax  History of gastric  bypass-noted  Essential hypertension-continue Norvasc   Scheduled Meds:  amLODipine  10 mg Oral Daily   calcium citrate  200 mg of elemental calcium Oral Daily   cholecalciferol  1,000 Units Oral Daily   clotrimazole  1 Applicatorful Vaginal QHS   cycloSPORINE  1 drop Both Eyes BID   enoxaparin (LOVENOX) injection  40 mg Subcutaneous Q24H   estradiol  1 Applicatorful Vaginal Once per day on Mon Wed Fri   ferrous sulfate  650 mg Oral Q breakfast   ketotifen  1 drop Both Eyes Daily   lamoTRIgine  200 mg Oral QHS   levothyroxine  75 mcg Oral Q0600   magnesium oxide  400 mg Oral Daily   mirtazapine  30 mg Oral QHS   mupirocin ointment  1 application Nasal BID   oxybutynin  2.5 mg Oral BID   pantoprazole  40 mg Oral Daily   potassium chloride SA  40 mEq Oral Daily   rOPINIRole  1 mg Oral QHS   saccharomyces boulardii  250 mg Oral BID   tamsulosin  0.4 mg Oral Daily   vancomycin  125 mg Oral QID   zinc oxide   Topical BID   Continuous Infusions:  meropenem (MERREM) IV 1 g (02/10/21 1054)   PRN Meds:.acetaminophen **OR** acetaminophen, azelastine **AND** fluticasone, baclofen, diphenhydrAMINE, ondansetron (ZOFRAN) IV, oxyCODONE, polyethylene glycol, promethazine, sucralfate, tiZANidine  Diet Orders (From admission, onward)     Start     Ordered   02/07/21 1326  Diet regular Room service appropriate? Yes; Fluid consistency: Thin  Diet effective now       Question Answer Comment  Room service appropriate? Yes   Fluid consistency: Thin      02/07/21 1325            DVT prophylaxis: enoxaparin (LOVENOX) injection 40  mg Start: 02/04/21 2200     Code Status: Full Code  Family Communication: No family at bedside  Status is: Inpatient  Remains inpatient appropriate because:Inpatient level of care appropriate due to severity of illness  Dispo: The patient is from: Home              Anticipated d/c is to: Home              Patient currently is not medically stable to  d/c.   Difficult to place patient No   Level of care: Telemetry  Consultants:  Infectious disease  Procedures:  2D echo  1. Left ventricular ejection fraction, by estimation, is 60 to 65%. The left ventricle has normal function. The left ventricle has no regional wall motion abnormalities. Left ventricular diastolic parameters are consistent with Grade I diastolic dysfunction (impaired relaxation).   2. Right ventricular systolic function is normal. The right ventricular size is normal. There is normal pulmonary artery systolic pressure.   3. The mitral valve is normal in structure. No evidence of mitral valve regurgitation. No evidence of mitral stenosis.   4. The aortic valve was not well visualized. Aortic valve regurgitation is trivial. No aortic stenosis is present.   5. The inferior vena cava is normal in size with greater than 50% respiratory variability, suggesting right atrial pressure of 3 mmHg.   Microbiology  Blood cultures -gram-negative rods from the port draw  Antimicrobials: Meropenem    Objective: Vitals:   02/09/21 1429 02/09/21 1942 02/10/21 0450 02/10/21 1032  BP: (!) 126/52 112/62 117/65 (!) 149/73  Pulse: 80 87 71   Resp: 18 17 17    Temp: 99.2 F (37.3 C) 98 F (36.7 C) 97.9 F (36.6 C)   TempSrc: Oral     SpO2: 100% 100% 100%   Weight:      Height:        Intake/Output Summary (Last 24 hours) at 02/10/2021 1107 Last data filed at 02/09/2021 2341 Gross per 24 hour  Intake 712 ml  Output --  Net 712 ml    Filed Weights   02/07/21 0500 02/08/21 0500 02/09/21 0500  Weight: 55.6 kg 57.1 kg 58.2 kg    Examination:  Constitutional: No distress, in bed Eyes: No icterus ENMT: Moist mucous membranes Neck: normal, supple Respiratory: Lungs are clear bilaterally, no wheezing Cardiovascular: Regular rate and rhythm, no murmurs Abdomen: Diffusely tender to palpation, no guarding, no rebound, abdomen is soft.  Bowel sounds positive Musculoskeletal:  no clubbing / cyanosis.  Skin: No rashes seen Neurologic: Nonfocal  Data Reviewed: I have independently reviewed following labs and imaging studies   CBC: Recent Labs  Lab 02/04/21 1711 02/08/21 0625 02/10/21 0459  WBC 6.2 4.0 3.2*  NEUTROABS 4.3  --   --   HGB 12.2 11.9* 11.3*  HCT 38.5 37.1 35.2*  MCV 93.0 92.5 92.6  PLT 200 147* 147*    Basic Metabolic Panel: Recent Labs  Lab 02/04/21 1711 02/05/21 0310 02/08/21 0625 02/10/21 0459  NA 138 136 139 140  K 5.4* 3.8 4.7 4.4  CL 103 104 105 109  CO2 28 25 30 26   GLUCOSE 93 149* 91 88  BUN 34* 28* 15 14  CREATININE 1.11* 1.13* 0.89 0.68  CALCIUM 8.7* 7.9* 9.1 8.8*    Liver Function Tests: Recent Labs  Lab 02/04/21 1711 02/05/21 0310  AST 35 36  ALT 32 31  ALKPHOS 78 66  BILITOT 0.3 0.4  PROT 8.1 6.5  ALBUMIN  3.6 2.9*    Coagulation Profile: No results for input(s): INR, PROTIME in the last 168 hours. HbA1C: No results for input(s): HGBA1C in the last 72 hours. CBG: No results for input(s): GLUCAP in the last 168 hours.  Recent Results (from the past 240 hour(s))  Culture, blood (routine x 2)     Status: None   Collection Time: 02/04/21  5:12 PM   Specimen: BLOOD  Result Value Ref Range Status   Specimen Description   Final    BLOOD RIGHT ARM Performed at [facility] [address], [place], [place] [address]    Special Requests   Final    BOTTLES DRAWN AEROBIC AND ANAEROBIC Blood Culture adequate volume Performed at [facility] [address], [place], [place] [address]    Culture   Final    NO GROWTH 5 DAYS Performed at [facility], [place] [address] [place], [place] [address]    Report Status 02/09/2021 FINAL  Final  Culture, blood (routine x 2)     Status: None   Collection Time: 02/04/21  5:12 PM   Specimen: BLOOD  Result Value Ref Range Status   Specimen Description   Final    BLOOD LEFT ANTECUBITAL Performed at [facility] [address] [place], [place] [address]    Special Requests   Final    BOTTLES DRAWN AEROBIC ONLY Blood Culture adequate volume Performed at [facility] [address], [place], [place] [address]    Culture   Final    NO GROWTH 5 DAYS Performed at [facility], [place] [address] [place], [place] [address]    Report Status 02/09/2021 FINAL  Final  Resp Panel by RT-PCR (Flu A&B, Covid) Nasopharyngeal Swab     Status: None   Collection Time: 02/04/21  5:56 PM   Specimen: Nasopharyngeal Swab; Nasopharyngeal(NP) swabs in vial transport medium  Result Value Ref Range Status   SARS Coronavirus 2 by RT PCR NEGATIVE NEGATIVE Final    Comment: (NOTE) SARS-CoV-2 target nucleic acids are NOT DETECTED.  The SARS-CoV-2 RNA is generally detectable in upper respiratory specimens during the acute phase of infection. The lowest concentration of SARS-CoV-2 viral copies this assay can detect is 138 copies/mL. A negative result does not preclude SARS-Cov-2 infection and should not be used as the sole basis for treatment or other patient management decisions. A negative result may occur with  improper specimen collection/handling, submission of specimen other than nasopharyngeal swab, presence of viral mutation(s) within the areas targeted by this assay, and inadequate number of viral copies(<138 copies/mL). A negative result must be combined with clinical observations, patient history, and epidemiological information. The expected result is Negative.  Fact Sheet for Patients:  EntrepreneurPulse.com.au  Fact Sheet for Healthcare Providers:  IncredibleEmployment.be  This test is no t yet approved or cleared by the [place] FDA and  has been authorized for detection and/or diagnosis of SARS-CoV-2 by FDA under an Emergency Use Authorization (EUA). This EUA will remain  in effect (meaning this test can be used) for the  duration of the COVID-19 declaration under Section 564(b)(1) of the Act, 21 U.S.C.section 360bbb-3(b)(1), unless the authorization is terminated  or revoked sooner.       Influenza A by PCR NEGATIVE NEGATIVE Final   Influenza B by PCR NEGATIVE NEGATIVE Final    Comment: (NOTE) The Xpert Xpress SARS-CoV-2/FLU/RSV plus assay is intended as an aid in the diagnosis  of influenza from Nasopharyngeal swab specimens and should not be used as a sole basis for treatment. Nasal washings and aspirates are unacceptable for Xpert Xpress SARS-CoV-2/FLU/RSV testing.  Fact Sheet for Patients: EntrepreneurPulse.com.au  Fact Sheet for Healthcare Providers: IncredibleEmployment.be  This test is not yet approved or cleared by the [place] FDA and has been authorized for detection and/or diagnosis of SARS-CoV-2 by FDA under an Emergency Use Authorization (EUA). This EUA will remain in effect (meaning this test can be used) for the duration of the COVID-19 declaration under Section 564(b)(1) of the Act, 21 U.S.C. section 360bbb-3(b)(1), unless the authorization is terminated or revoked.  Performed at [facility], [place] [address] [place], [place] [address]   Culture, blood (single)     Status: None (Preliminary result)   Collection Time: 02/04/21  6:37 PM   Specimen: BLOOD  Result Value Ref Range Status   Specimen Description   Final    BLOOD PORTA CATH LEFT CHEST Performed at [facility] [address] [place], [place] [address]    Special Requests   Final    BOTTLES DRAWN AEROBIC AND ANAEROBIC Blood Culture adequate volume Performed at [facility] [address] [place], [place] [address]    Culture  Setup Time   Final    GRAM NEGATIVE RODS IN BOTH AEROBIC AND ANAEROBIC BOTTLES CRITICAL RESULT CALLED TO, READ BACK BY AND VERIFIED WITH: Melodye Ped [facility] 0350 02/05/21 A BROWNING    Culture    Final    GRAM NEGATIVE RODS SENT TO [facility] FOR IDENTIFICATION Sent to [facility] for further susceptibility testing. Performed at [facility], [place] [address], [place], [place] [address]    Report Status PENDING  Incomplete  Blood Culture ID Panel (Reflexed)     Status: None   Collection Time: 02/04/21  6:37 PM  Result Value Ref Range Status   Enterococcus faecalis NOT DETECTED NOT DETECTED Final   Enterococcus Faecium NOT DETECTED NOT DETECTED Final   Listeria monocytogenes NOT DETECTED NOT DETECTED Final   Staphylococcus species NOT DETECTED NOT DETECTED Final   Staphylococcus aureus (BCID) NOT DETECTED NOT DETECTED Final   Staphylococcus epidermidis NOT DETECTED NOT DETECTED Final   Staphylococcus lugdunensis NOT DETECTED NOT DETECTED Final   Streptococcus species NOT DETECTED NOT DETECTED Final   Streptococcus agalactiae NOT DETECTED NOT DETECTED Final   Streptococcus pneumoniae NOT DETECTED NOT DETECTED Final   Streptococcus pyogenes NOT DETECTED NOT DETECTED Final   A.calcoaceticus-baumannii NOT DETECTED NOT DETECTED Final   Bacteroides fragilis NOT DETECTED NOT DETECTED Final   Enterobacterales NOT DETECTED NOT DETECTED Final   Enterobacter cloacae complex NOT DETECTED NOT DETECTED Final   Escherichia coli NOT DETECTED NOT DETECTED Final   Klebsiella aerogenes NOT DETECTED NOT DETECTED Final   Klebsiella oxytoca NOT DETECTED NOT DETECTED Final   Klebsiella pneumoniae NOT DETECTED NOT DETECTED Final   Proteus species NOT DETECTED NOT DETECTED Final   Salmonella species NOT DETECTED NOT DETECTED Final   Serratia marcescens NOT DETECTED NOT DETECTED Final   Haemophilus influenzae NOT DETECTED NOT DETECTED Final   Neisseria meningitidis NOT DETECTED NOT DETECTED Final   Pseudomonas aeruginosa NOT DETECTED NOT DETECTED Final   Stenotrophomonas maltophilia NOT DETECTED NOT DETECTED Final   Candida albicans NOT DETECTED NOT DETECTED Final   Candida auris NOT DETECTED NOT  DETECTED Final   Candida glabrata NOT DETECTED NOT DETECTED Final   Candida krusei NOT DETECTED NOT DETECTED Final  Candida parapsilosis NOT DETECTED NOT DETECTED Final   Candida tropicalis NOT DETECTED NOT DETECTED Final   Cryptococcus neoformans/gattii NOT DETECTED NOT DETECTED Final    Comment: Performed at [facility], [address] [place], [place] [address]  Culture, Urine     Status: None   Collection Time: 02/05/21  2:00 AM   Specimen: Urine, Clean Catch  Result Value Ref Range Status   Specimen Description   Final    URINE, CLEAN CATCH Performed at [facility], [place] [address] [place], [place] [address]    Special Requests   Final    NONE Performed at [facility], [place] [address], [place], [place] [address]    Culture   Final    NO GROWTH Performed at [facility], [place] [address], [place], [place] [address]    Report Status 02/06/2021 FINAL  Final  Surgical pcr screen     Status: Abnormal   Collection Time: 02/07/21  8:03 AM   Specimen: Nasal Mucosa; Nasal Swab  Result Value Ref Range Status   MRSA, PCR NEGATIVE NEGATIVE Final   Staphylococcus aureus POSITIVE (A) NEGATIVE Final    Comment: (NOTE) The Xpert SA Assay (FDA approved for NASAL specimens in patients 51 years of age and older), is one component of a comprehensive surveillance program. It is not intended to diagnose infection nor to guide or monitor treatment. Performed at [facility], [place] [address], [place],  [address]     Radiology Studies: No results found.  Marzetta Board, MD, PhD [facility]  Between 7 am - 7 pm I am available, please contact me via Amion (for emergencies) or Securechat (non urgent messages)  Between 7 pm - 7 am I am not available, please contact night coverage MD/APP via Amion

## 2021-02-10 NOTE — Care Management Important Message (Signed)
Important Message  Patient Details IM Letter given to the Patient. Name: Megan Brewer MRN: [id number] Date of Birth: 1946-07-19   Medicare Important Message Given:  Yes     [name] 02/10/2021, 2:44 PM

## 2021-02-10 NOTE — Progress Notes (Signed)
   02/10/21 1200  Mobility  Activity Ambulated in hall  Level of Assistance Modified independent, requires aide device or extra time  Assistive Device Front wheel walker  Distance Ambulated (ft) 400 ft  Mobility Ambulated independently in room;Ambulated with assistance in hallway  Mobility Response Tolerated well  Mobility performed by Mobility specialist  $Mobility charge 1 Mobility   Pt agreed to ambulate in hall with walker about 474ft. She noted that she felt a little "wobbly" at the beginning. She did not state any towards the end. No other complaints.   [facility] Specialist Acute Rehab Services Office: [phone]

## 2021-02-11 LAB — BASIC METABOLIC PANEL
Anion gap: 5 (ref 5–15)
BUN: 14 mg/dL (ref 8–23)
CO2: 24 mmol/L (ref 22–32)
Calcium: 8.6 mg/dL — ABNORMAL LOW (ref 8.9–10.3)
Chloride: 109 mmol/L (ref 98–111)
Creatinine, Ser: 0.75 mg/dL (ref 0.44–1.00)
GFR, Estimated: >60 mL/min (ref >60)
Glucose, Bld: 84 mg/dL (ref 70–99)
Potassium: 4.5 mmol/L (ref 3.5–5.1)
Sodium: 138 mmol/L (ref 135–145)

## 2021-02-11 LAB — MISC LABCORP TEST (SEND OUT): Labcorp test code: 8664

## 2021-02-11 LAB — CBC
HCT: 34.3 % — ABNORMAL LOW (ref 36.0–46.0)
Hemoglobin: 10.9 g/dL — ABNORMAL LOW (ref 12.0–15.0)
MCH: 29.5 pg (ref 26.0–34.0)
MCHC: 31.8 g/dL (ref 30.0–36.0)
MCV: 93 fL (ref 80.0–100.0)
Platelets: 143 10*3/uL — ABNORMAL LOW (ref 150–400)
RBC: 3.69 MIL/uL — ABNORMAL LOW (ref 3.87–5.11)
RDW: 13.4 % (ref 11.5–15.5)
WBC: 4 10*3/uL (ref 4.0–10.5)
nRBC: 0 % (ref 0.0–0.2)

## 2021-02-11 MED ORDER — DIPHENHYDRAMINE HCL 50 MG/ML IJ SOLN
12.5000 mg | Freq: Four times a day (QID) | INTRAMUSCULAR | Status: DC | PRN
  Administered 2021-02-11 – 2021-02-13 (×4): 12.5 mg via INTRAVENOUS
  Filled 2021-02-11 (×4): qty 1

## 2021-02-11 MED ORDER — ERENUMAB-AOOE 70 MG/ML ~~LOC~~ SOAJ: 70.0000 mg | SUBCUTANEOUS | Status: DC

## 2021-02-11 MED ORDER — FLUCONAZOLE 100 MG PO TABS
100.0000 mg | ORAL_TABLET | Freq: Once | ORAL | Status: DC
  Filled 2021-02-11: qty 1

## 2021-02-11 MED ORDER — FLUCONAZOLE 150 MG PO TABS
150.0000 mg | ORAL_TABLET | Freq: Once | ORAL | Status: AC
  Administered 2021-02-11: 150 mg via ORAL
  Filled 2021-02-11: qty 1

## 2021-02-11 NOTE — Progress Notes (Addendum)
[facility] Infectious Diseases Follow Up Note  Patient Identification: Patient Name: Megan Brewer MRN: [id number] Aspers Date: 02/04/2021  4:43 PM Age: 75 y.o.Today's Date: 02/11/2021   Reason for Visit: Gram-negative bacteremia  Active Problems:   Rigors    Antibiotics: PO Vancomycin 6/22 -current                    cefepime 6/21                    Meropenem 6/22-current   Lines/Tubes: Port-A-Cath has been removed  Interval Events: Patient continues to be afebrile, no leukocytosis, GNR identification and sensitivities pending.  Diarrhea is improving   Assessment Recent gram-negative/Gram variable bacteremia status post 2 weeks of cefepime. Sensitivities still pending and was sent to [facility] -  no updates yet    Gram-negative bacteremia this admission status post implanted port removal on 6/24 Port cultures growing gram-negative rods, ID and sensitivities pending. TTE is negative for vegetations   C. difficile diarrhea(second episode) - Clinically imrpoving, no more loose stool since yesteraday 9 :3 0 pm. Abdomen is soft and non tender   Vaginal Itching: Possible candidiasis, on fluconazole  Recommendations No new recommendations from yesterday  Fluconazole for possible vaginal candidiasis  Rest of the management as per the primary team. Thank you for the consult. Please page with pertinent questions or concerns.  ______________________________________________________________________ Subjective patient seen and examined at the bedside.  She feels better today, denies having any episodes of bowel movement since 9:30 PM yesterday.  Denies having any nausea, vomiting or abdominal pain.  Her husband is at the bedside   Vitals BP 123/65   Pulse 68   Temp 98.4 F (36.9 C) (Oral)   Resp 18   Ht 5\' 2"  (1.575 m)   Wt 58.3 kg   SpO2 100%   BMI 23.51 kg/m     Physical Exam Constitutional: Sitting up in bed,  appears comfortable    Comments:   Cardiovascular:     Rate and Rhythm: Normal rate and regular rhythm.     Heart sounds:   Pulmonary:     Effort: Pulmonary effort is normal.     Comments:   Abdominal:     Palpations: Abdomen is soft.     Tenderness: Nontender, nondistended, bowel sounds less active than yesterday  Musculoskeletal:        General: No swelling or tenderness.   Skin:    Comments: No lesions or rashes  Neurological:     General: No focal deficit present.   Psychiatric:        Mood and Affect: Mood normal.   Pertinent Microbiology Results for orders placed or performed during the hospital encounter of 02/04/21  Culture, blood (routine x 2)     Status: None   Collection Time: 02/04/21  5:12 PM   Specimen: BLOOD  Result Value Ref Range Status   Specimen Description   Final    BLOOD RIGHT ARM Performed at [facility] [address], [place], [place] [address]    Special Requests   Final    BOTTLES DRAWN AEROBIC AND ANAEROBIC Blood Culture adequate volume Performed at [facility] [address] [place], [place] [address]    Culture   Final    NO GROWTH 5 DAYS Performed at [facility], [place] [address] [place], [place] [address]    Report Status 02/09/2021 FINAL  Final  Culture, blood (routine x  2)     Status: None   Collection Time: 02/04/21  5:12 PM   Specimen: BLOOD  Result Value Ref Range Status   Specimen Description   Final    BLOOD LEFT ANTECUBITAL Performed at [facility] [address], [place], [place] [address]    Special Requests   Final    BOTTLES DRAWN AEROBIC ONLY Blood Culture adequate volume Performed at [facility] [address], [place], [place] [address]    Culture   Final    NO GROWTH 5 DAYS Performed at [facility], [place] [address] [place], [place] [address]    Report Status 02/09/2021 FINAL  Final  Resp Panel by RT-PCR (Flu  A&B, Covid) Nasopharyngeal Swab     Status: None   Collection Time: 02/04/21  5:56 PM   Specimen: Nasopharyngeal Swab; Nasopharyngeal(NP) swabs in vial transport medium  Result Value Ref Range Status   SARS Coronavirus 2 by RT PCR NEGATIVE NEGATIVE Final    Comment: (NOTE) SARS-CoV-2 target nucleic acids are NOT DETECTED.  The SARS-CoV-2 RNA is generally detectable in upper respiratory specimens during the acute phase of infection. The lowest concentration of SARS-CoV-2 viral copies this assay can detect is 138 copies/mL. A negative result does not preclude SARS-Cov-2 infection and should not be used as the sole basis for treatment or other patient management decisions. A negative result may occur with  improper specimen collection/handling, submission of specimen other than nasopharyngeal swab, presence of viral mutation(s) within the areas targeted by this assay, and inadequate number of viral copies(<138 copies/mL). A negative result must be combined with clinical observations, patient history, and epidemiological information. The expected result is Negative.  Fact Sheet for Patients:  EntrepreneurPulse.com.au  Fact Sheet for Healthcare Providers:  IncredibleEmployment.be  This test is no t yet approved or cleared by the [place] FDA and  has been authorized for detection and/or diagnosis of SARS-CoV-2 by FDA under an Emergency Use Authorization (EUA). This EUA will remain  in effect (meaning this test can be used) for the duration of the COVID-19 declaration under Section 564(b)(1) of the Act, 21 U.S.C.section 360bbb-3(b)(1), unless the authorization is terminated  or revoked sooner.       Influenza A by PCR NEGATIVE NEGATIVE Final   Influenza B by PCR NEGATIVE NEGATIVE Final    Comment: (NOTE) The Xpert Xpress SARS-CoV-2/FLU/RSV plus assay is intended as an aid in the diagnosis of influenza from Nasopharyngeal swab specimens  and should not be used as a sole basis for treatment. Nasal washings and aspirates are unacceptable for Xpert Xpress SARS-CoV-2/FLU/RSV testing.  Fact Sheet for Patients: EntrepreneurPulse.com.au  Fact Sheet for Healthcare Providers: IncredibleEmployment.be  This test is not yet approved or cleared by the [place] FDA and has been authorized for detection and/or diagnosis of SARS-CoV-2 by FDA under an Emergency Use Authorization (EUA). This EUA will remain in effect (meaning this test can be used) for the duration of the COVID-19 declaration under Section 564(b)(1) of the Act, 21 U.S.C. section 360bbb-3(b)(1), unless the authorization is terminated or revoked.  Performed at [facility], [place] [address], [place], [place] [address]   Culture, blood (single)     Status: None (Preliminary result)   Collection Time: 02/04/21  6:37 PM   Specimen: BLOOD  Result Value Ref Range Status   Specimen Description   Final    BLOOD PORTA CATH LEFT CHEST Performed at [facility]  [address] [place], [place] [address]    Special Requests   Final    BOTTLES DRAWN AEROBIC AND ANAEROBIC Blood Culture adequate volume Performed at [facility] [address] [place], [place] [address]    Culture  Setup Time   Final    GRAM NEGATIVE RODS IN BOTH AEROBIC AND ANAEROBIC BOTTLES CRITICAL RESULT CALLED TO, READ BACK BY AND VERIFIED WITH: Melodye Ped [facility] 3419 02/05/21 A BROWNING    Culture   Final    GRAM NEGATIVE RODS SENT TO [facility] FOR IDENTIFICATION Sent to [facility] for further susceptibility testing. Performed at [facility], [place] [address], [place], [place] [address]    Report Status PENDING  Incomplete  Blood Culture ID Panel (Reflexed)     Status: None   Collection Time: 02/04/21  6:37 PM  Result Value Ref Range Status   Enterococcus faecalis NOT DETECTED NOT DETECTED Final    Enterococcus Faecium NOT DETECTED NOT DETECTED Final   Listeria monocytogenes NOT DETECTED NOT DETECTED Final   Staphylococcus species NOT DETECTED NOT DETECTED Final   Staphylococcus aureus (BCID) NOT DETECTED NOT DETECTED Final   Staphylococcus epidermidis NOT DETECTED NOT DETECTED Final   Staphylococcus lugdunensis NOT DETECTED NOT DETECTED Final   Streptococcus species NOT DETECTED NOT DETECTED Final   Streptococcus agalactiae NOT DETECTED NOT DETECTED Final   Streptococcus pneumoniae NOT DETECTED NOT DETECTED Final   Streptococcus pyogenes NOT DETECTED NOT DETECTED Final   A.calcoaceticus-baumannii NOT DETECTED NOT DETECTED Final   Bacteroides fragilis NOT DETECTED NOT DETECTED Final   Enterobacterales NOT DETECTED NOT DETECTED Final   Enterobacter cloacae complex NOT DETECTED NOT DETECTED Final   Escherichia coli NOT DETECTED NOT DETECTED Final   Klebsiella aerogenes NOT DETECTED NOT DETECTED Final   Klebsiella oxytoca NOT DETECTED NOT DETECTED Final   Klebsiella pneumoniae NOT DETECTED NOT DETECTED Final   Proteus species NOT DETECTED NOT DETECTED Final   Salmonella species NOT DETECTED NOT DETECTED Final   Serratia marcescens NOT DETECTED NOT DETECTED Final   Haemophilus influenzae NOT DETECTED NOT DETECTED Final   Neisseria meningitidis NOT DETECTED NOT DETECTED Final   Pseudomonas aeruginosa NOT DETECTED NOT DETECTED Final   Stenotrophomonas maltophilia NOT DETECTED NOT DETECTED Final   Candida albicans NOT DETECTED NOT DETECTED Final   Candida auris NOT DETECTED NOT DETECTED Final   Candida glabrata NOT DETECTED NOT DETECTED Final   Candida krusei NOT DETECTED NOT DETECTED Final   Candida parapsilosis NOT DETECTED NOT DETECTED Final   Candida tropicalis NOT DETECTED NOT DETECTED Final   Cryptococcus neoformans/gattii NOT DETECTED NOT DETECTED Final    Comment: Performed at [facility], [address], [place], [place] [address]  Culture, Urine     Status: None    Collection Time: 02/05/21  2:00 AM   Specimen: Urine, Clean Catch  Result Value Ref Range Status   Specimen Description   Final    URINE, CLEAN CATCH Performed at [facility], [place] [address], [place], [place] [address]    Special Requests   Final    NONE Performed at [facility], [place] [address], [place], [place] [address]    Culture   Final    NO GROWTH Performed at [facility], [place] [address], [place], [place] [address]    Report Status 02/06/2021 FINAL  Final  Surgical pcr screen     Status: Abnormal   Collection Time: 02/07/21  8:03 AM   Specimen: Nasal  Mucosa; Nasal Swab  Result Value Ref Range Status   MRSA, PCR NEGATIVE NEGATIVE Final   Staphylococcus aureus POSITIVE (A) NEGATIVE Final    Comment: (NOTE) The Xpert SA Assay (FDA approved for NASAL specimens in patients 45 years of age and older), is one component of a comprehensive surveillance program. It is not intended to diagnose infection nor to guide or monitor treatment. Performed at [facility], [place] [address] [place], [place] [address]       Pertinent Lab. CBC Latest Ref Rng & Units 02/11/2021 02/10/2021 02/08/2021  WBC 4.0 - 10.5 K/uL 4.0 3.2(L) 4.0  Hemoglobin 12.0 - 15.0 g/dL 10.9(L) 11.3(L) 11.9(L)  Hematocrit 36.0 - 46.0 % 34.3(L) 35.2(L) 37.1  Platelets 150 - 400 K/uL 143(L) 147(L) 147(L)   CMP Latest Ref Rng & Units 02/11/2021 02/10/2021 02/08/2021  Glucose 70 - 99 mg/dL 84 88 91  BUN 8 - 23 mg/dL 14 14 15   Creatinine 0.44 - 1.00 mg/dL 0.75 0.68 0.89  Sodium 135 - 145 mmol/L 138 140 139  Potassium 3.5 - 5.1 mmol/L 4.5 4.4 4.7  [place] 98 - 111 mmol/L 109 109 105  CO2 22 - 32 mmol/L 24 26 30   Calcium 8.9 - 10.3 mg/dL 8.6(L) 8.8(L) 9.1  Total Protein 6.5 - 8.1 g/dL - - -  Total Bilirubin 0.3 - 1.2 mg/dL - - -  Alkaline Phos 38 - 126 U/L - - -  AST 15 - 41 U/L - - -  ALT 0 - 44 U/L - - -     Pertinent Imaging today Plain films  and CT images have been personally visualized and interpreted; radiology reports have been reviewed. Decision making incorporated into the Impression / Recommendations.  I have spent more than 35 minutes for this patient encounter including review of prior medical records, coordination of care  with greater than 50% of time being face to face/counseling and discussing diagnostics/treatment plan with the patient/family.  Electronically signed by:   Rosiland Oz, MD Infectious Disease Physician [facility] Pager: [phone]

## 2021-02-11 NOTE — Progress Notes (Signed)
PROGRESS NOTE  Jasilyn Holderman WJX:[id number] DOB: [birth year] DOA: 02/04/2021 PCP: [name], NP   LOS: 7 days   Brief Narrative / Interim history: 75 year old female with bipolar disorder, lung cancer, C. difficile, hypogammaglobulinemia, hypothyroidism, gastric bypass comes in as a direct admission from the ID clinic with rigors, malaise.  She was recently hospitalized and treated for gram-negative bacteremia and C. difficile infection.  She finished 14 days of cefepime on 6/15, and following that over the course of the last several days she started feeling poorly, fatigue, rigors, generalized weakness and came back to the hospital.  Her diarrhea was better on oral vancomycin however got worse just prior to admission.  Subjective / 24h Interval events: No more abdominal cramping, diarrhea seems to be getting better again. Complains of her migraine headaches being worse asking for her home Aimovig which she is due Complains of vaginal itching  Assessment & Plan: Principal Problem Concern for persistent bacteremia-recently with gram-negative's.  ID consulted and following, she apparently grew Herbspirillum huttience.  Continue meropenem, oral vancomycin per ID.  ID recommends 7 days of intravenous antibiotics from port removal on 6/24 -2D echo without evidence of vegetations -Source for persistent bacteremia was port given positive blood cultures with gram-negative rods from the port draw.  General surgery consulted and port was removed on 6/24 -ID recommends to remain hospitalized until speciation for this bacteria is available or finishing 7 days of IV antibiotics from port removal then she can go home afterwards  Active Problems C. difficile diarrhea-continue oral vancomycin.  Was slightly worse on 6/27 but better today.  B12 deficiency-give IM B12 x1 on 6/25, will need oral supplementation on discharge  History of hypogammaglobinemia-receives Gammagard every 3 weeks, last  dose 6/17  Migraine headaches-husband to bring her home medication so that we can give it to her today.  Discussed with pharmacy  Vaginal itching-Diflucan x1, related to antibiotics  Recent Enterococcus UTI-treated  Hypothyroidism-continue Synthroid  Overactive bladder-continue home medications  Urinary retention-continue Flomax  History of gastric bypass-noted  Essential hypertension-continue Norvasc   Scheduled Meds:  amLODipine  10 mg Oral Daily   calcium citrate  200 mg of elemental calcium Oral Daily   cholecalciferol  1,000 Units Oral Daily   clotrimazole  1 Applicatorful Vaginal QHS   cycloSPORINE  1 drop Both Eyes BID   enoxaparin (LOVENOX) injection  40 mg Subcutaneous Q24H   [START ON 03/03/2021] Erenumab-aooe  70 mg Subcutaneous Q30 days   estradiol  1 Applicatorful Vaginal Once per day on Mon Wed Fri   ferrous sulfate  650 mg Oral Q breakfast   ketotifen  1 drop Both Eyes Daily   lamoTRIgine  200 mg Oral QHS   levothyroxine  75 mcg Oral Q0600   magnesium oxide  400 mg Oral Daily   mirtazapine  30 mg Oral QHS   mupirocin ointment  1 application Nasal BID   oxybutynin  2.5 mg Oral BID   pantoprazole  40 mg Oral Daily   potassium chloride SA  40 mEq Oral Daily   rOPINIRole  1 mg Oral QHS   saccharomyces boulardii  250 mg Oral BID   tamsulosin  0.4 mg Oral Daily   vancomycin  125 mg Oral QID   zinc oxide   Topical BID   Continuous Infusions:  meropenem (MERREM) IV 1 g (02/11/21 1010)   PRN Meds:.acetaminophen **OR** acetaminophen, azelastine **AND** fluticasone, baclofen, diphenhydrAMINE, ondansetron (ZOFRAN) IV, oxyCODONE, polyethylene glycol, promethazine, sucralfate, tiZANidine  Diet Orders (  From admission, onward)     Start     Ordered   02/07/21 1326  Diet regular Room service appropriate? Yes; Fluid consistency: Thin  Diet effective now       Question Answer Comment  Room service appropriate? Yes   Fluid consistency: Thin      02/07/21 1325             DVT prophylaxis: enoxaparin (LOVENOX) injection 40 mg Start: 02/04/21 2200     Code Status: Full Code  Family Communication: No family at bedside  Status is: Inpatient  Remains inpatient appropriate because:Inpatient level of care appropriate due to severity of illness  Dispo: The patient is from: Home              Anticipated d/c is to: Home              Patient currently is not medically stable to d/c.   Difficult to place patient No   Level of care: Telemetry  Consultants:  Infectious disease  Procedures:  2D echo  1. Left ventricular ejection fraction, by estimation, is 60 to 65%. The left ventricle has normal function. The left ventricle has no regional wall motion abnormalities. Left ventricular diastolic parameters are consistent with Grade I diastolic dysfunction (impaired relaxation).   2. Right ventricular systolic function is normal. The right ventricular size is normal. There is normal pulmonary artery systolic pressure.   3. The mitral valve is normal in structure. No evidence of mitral valve regurgitation. No evidence of mitral stenosis.   4. The aortic valve was not well visualized. Aortic valve regurgitation is trivial. No aortic stenosis is present.   5. The inferior vena cava is normal in size with greater than 50% respiratory variability, suggesting right atrial pressure of 3 mmHg.   Microbiology  Blood cultures -gram-negative rods from the port draw  Antimicrobials: Meropenem    Objective: Vitals:   02/10/21 2035 02/11/21 0500 02/11/21 0520 02/11/21 0959  BP: (!) 98/47  123/65 132/70  Pulse: 69  68   Resp: 18  18   Temp: 97.8 F (36.6 C)  98.4 F (36.9 C)   TempSrc: Oral  Oral   SpO2: 99%  100%   Weight:  58.3 kg    Height:        Intake/Output Summary (Last 24 hours) at 02/11/2021 1041 Last data filed at 02/11/2021 0826 Gross per 24 hour  Intake 354 ml  Output --  Net 354 ml    Filed Weights   02/08/21 0500 02/09/21 0500  02/11/21 0500  Weight: 57.1 kg 58.2 kg 58.3 kg    Examination:  Constitutional: NAD, in bed Eyes: No scleral icterus ENMT: mmm Neck: normal, supple Respiratory: Clear to auscultation bilaterally, no wheezing, no crackles Cardiovascular: Regular rate and rhythm, no murmurs appreciated.  No edema Abdomen: No tenderness today, no guarding, no rebound, bowel sounds positive Musculoskeletal: no clubbing / cyanosis.  Skin: No rashes seen Neurologic: No focal deficits  Data Reviewed: I have independently reviewed following labs and imaging studies   CBC: Recent Labs  Lab 02/04/21 1711 02/08/21 0625 02/10/21 0459 02/11/21 0444  WBC 6.2 4.0 3.2* 4.0  NEUTROABS 4.3  --   --   --   HGB 12.2 11.9* 11.3* 10.9*  HCT 38.5 37.1 35.2* 34.3*  MCV 93.0 92.5 92.6 93.0  PLT 200 147* 147* 143*    Basic Metabolic Panel: Recent Labs  Lab 02/04/21 1711 02/05/21 0310 02/08/21 0973 02/10/21 0459 02/11/21 0444  NA 138 136 139 140 138  K 5.4* 3.8 4.7 4.4 4.5  CL 103 104 105 109 109  CO2 28 25 30 26 24   GLUCOSE 93 149* 91 88 84  BUN 34* 28* 15 14 14   CREATININE 1.11* 1.13* 0.89 0.68 0.75  CALCIUM 8.7* 7.9* 9.1 8.8* 8.6*    Liver Function Tests: Recent Labs  Lab 02/04/21 1711 02/05/21 0310  AST 35 36  ALT 32 31  ALKPHOS 78 66  BILITOT 0.3 0.4  PROT 8.1 6.5  ALBUMIN 3.6 2.9*    Coagulation Profile: No results for input(s): INR, PROTIME in the last 168 hours. HbA1C: No results for input(s): HGBA1C in the last 72 hours. CBG: No results for input(s): GLUCAP in the last 168 hours.  Recent Results (from the past 240 hour(s))  Culture, blood (routine x 2)     Status: None   Collection Time: 02/04/21  5:12 PM   Specimen: BLOOD  Result Value Ref Range Status   Specimen Description   Final    BLOOD RIGHT ARM Performed at [facility] [address] [place], [place] [address]    Special Requests   Final    BOTTLES DRAWN AEROBIC AND ANAEROBIC Blood Culture  adequate volume Performed at [facility] [address], [place], [place] [address]    Culture   Final    NO GROWTH 5 DAYS Performed at [facility], [place] [address], [place], [place] [address]    Report Status 02/09/2021 FINAL  Final  Culture, blood (routine x 2)     Status: None   Collection Time: 02/04/21  5:12 PM   Specimen: BLOOD  Result Value Ref Range Status   Specimen Description   Final    BLOOD LEFT ANTECUBITAL Performed at [facility] [address] [place], [place] [address]    Special Requests   Final    BOTTLES DRAWN AEROBIC ONLY Blood Culture adequate volume Performed at [facility] [address], [place], [place] [address]    Culture   Final    NO GROWTH 5 DAYS Performed at [facility], [place] [address] [place], [place] [address]    Report Status 02/09/2021 FINAL  Final  Resp Panel by RT-PCR (Flu A&B, Covid) Nasopharyngeal Swab     Status: None   Collection Time: 02/04/21  5:56 PM   Specimen: Nasopharyngeal Swab; Nasopharyngeal(NP) swabs in vial transport medium  Result Value Ref Range Status   SARS Coronavirus 2 by RT PCR NEGATIVE NEGATIVE Final    Comment: (NOTE) SARS-CoV-2 target nucleic acids are NOT DETECTED.  The SARS-CoV-2 RNA is generally detectable in upper respiratory specimens during the acute phase of infection. The lowest concentration of SARS-CoV-2 viral copies this assay can detect is 138 copies/mL. A negative result does not preclude SARS-Cov-2 infection and should not be used as the sole basis for treatment or other patient management decisions. A negative result may occur with  improper specimen collection/handling, submission of specimen other than nasopharyngeal swab, presence of viral mutation(s) within the areas targeted by this assay, and inadequate number of viral copies(<138 copies/mL). A negative result must be combined with clinical observations, patient  history, and epidemiological information. The expected result is Negative.  Fact Sheet for Patients:  EntrepreneurPulse.com.au  Fact Sheet for Healthcare Providers:  IncredibleEmployment.be  This test is no t yet approved or cleared by the [place] FDA and  has been authorized for detection  and/or diagnosis of SARS-CoV-2 by FDA under an Emergency Use Authorization (EUA). This EUA will remain  in effect (meaning this test can be used) for the duration of the COVID-19 declaration under Section 564(b)(1) of the Act, 21 U.S.C.section 360bbb-3(b)(1), unless the authorization is terminated  or revoked sooner.       Influenza A by PCR NEGATIVE NEGATIVE Final   Influenza B by PCR NEGATIVE NEGATIVE Final    Comment: (NOTE) The Xpert Xpress SARS-CoV-2/FLU/RSV plus assay is intended as an aid in the diagnosis of influenza from Nasopharyngeal swab specimens and should not be used as a sole basis for treatment. Nasal washings and aspirates are unacceptable for Xpert Xpress SARS-CoV-2/FLU/RSV testing.  Fact Sheet for Patients: EntrepreneurPulse.com.au  Fact Sheet for Healthcare Providers: IncredibleEmployment.be  This test is not yet approved or cleared by the [place] FDA and has been authorized for detection and/or diagnosis of SARS-CoV-2 by FDA under an Emergency Use Authorization (EUA). This EUA will remain in effect (meaning this test can be used) for the duration of the COVID-19 declaration under Section 564(b)(1) of the Act, 21 U.S.C. section 360bbb-3(b)(1), unless the authorization is terminated or revoked.  Performed at [facility], [place] [address] [place], [place] [address]   Culture, blood (single)     Status: None (Preliminary result)   Collection Time: 02/04/21  6:37 PM   Specimen: BLOOD  Result Value Ref Range Status   Specimen Description   Final    BLOOD PORTA CATH  LEFT CHEST Performed at [facility] [address] [place], [place] [address]    Special Requests   Final    BOTTLES DRAWN AEROBIC AND ANAEROBIC Blood Culture adequate volume Performed at [facility] [address], [place], [place] [address]    Culture  Setup Time   Final    GRAM NEGATIVE RODS IN BOTH AEROBIC AND ANAEROBIC BOTTLES CRITICAL RESULT CALLED TO, READ BACK BY AND VERIFIED WITH: Melodye Ped [facility] 9629 02/05/21 A [place]    Culture   Final    GRAM NEGATIVE RODS SENT TO [facility] FOR IDENTIFICATION Sent to [place] for further susceptibility testing. Performed at [facility], [place] [address] [place],  [address]    Report Status PENDING  Incomplete  Blood Culture ID Panel (Reflexed)     Status: None   Collection Time: 02/04/21  6:37 PM  Result Value Ref Range Status   Enterococcus faecalis NOT DETECTED NOT DETECTED Final   Enterococcus Faecium NOT DETECTED NOT DETECTED Final   Listeria monocytogenes NOT DETECTED NOT DETECTED Final   Staphylococcus species NOT DETECTED NOT DETECTED Final   Staphylococcus aureus (BCID) NOT DETECTED NOT DETECTED Final   Staphylococcus epidermidis NOT DETECTED NOT DETECTED Final   Staphylococcus lugdunensis NOT DETECTED NOT DETECTED Final   Streptococcus species NOT DETECTED NOT DETECTED Final   Streptococcus agalactiae NOT DETECTED NOT DETECTED Final   Streptococcus pneumoniae NOT DETECTED NOT DETECTED Final   Streptococcus pyogenes NOT DETECTED NOT DETECTED Final   A.calcoaceticus-baumannii NOT DETECTED NOT DETECTED Final   Bacteroides fragilis NOT DETECTED NOT DETECTED Final   Enterobacterales NOT DETECTED NOT DETECTED Final   Enterobacter cloacae complex NOT DETECTED NOT DETECTED Final   Escherichia coli NOT DETECTED NOT DETECTED Final   Klebsiella aerogenes NOT DETECTED NOT DETECTED Final   Klebsiella oxytoca NOT DETECTED NOT DETECTED Final   Klebsiella pneumoniae NOT DETECTED NOT  DETECTED Final   Proteus species NOT DETECTED NOT DETECTED Final  Salmonella species NOT DETECTED NOT DETECTED Final   Serratia marcescens NOT DETECTED NOT DETECTED Final   Haemophilus influenzae NOT DETECTED NOT DETECTED Final   Neisseria meningitidis NOT DETECTED NOT DETECTED Final   Pseudomonas aeruginosa NOT DETECTED NOT DETECTED Final   Stenotrophomonas maltophilia NOT DETECTED NOT DETECTED Final   Candida albicans NOT DETECTED NOT DETECTED Final   Candida auris NOT DETECTED NOT DETECTED Final   Candida glabrata NOT DETECTED NOT DETECTED Final   Candida krusei NOT DETECTED NOT DETECTED Final   Candida parapsilosis NOT DETECTED NOT DETECTED Final   Candida tropicalis NOT DETECTED NOT DETECTED Final   Cryptococcus neoformans/gattii NOT DETECTED NOT DETECTED Final    Comment: Performed at [facility], [place] [address] [place], [place] [address]  Culture, Urine     Status: None   Collection Time: 02/05/21  2:00 AM   Specimen: Urine, Clean Catch  Result Value Ref Range Status   Specimen Description   Final    URINE, CLEAN CATCH Performed at [facility], [place] [address] [place], [place] [address]    Special Requests   Final    NONE Performed at [facility], [place] [address] [place], [place] [address]    Culture   Final    NO GROWTH Performed at [facility], [place] [address] [place], [place] [address]    Report Status 02/06/2021 FINAL  Final  Surgical pcr screen     Status: Abnormal   Collection Time: 02/07/21  8:03 AM   Specimen: Nasal Mucosa; Nasal Swab  Result Value Ref Range Status   MRSA, PCR NEGATIVE NEGATIVE Final   Staphylococcus aureus POSITIVE (A) NEGATIVE Final    Comment: (NOTE) The Xpert SA Assay (FDA approved for NASAL specimens in patients 65 years of age and older), is one component of a comprehensive surveillance program. It is not intended to diagnose infection nor to guide or monitor treatment. Performed  at [facility], [place] [address], [place], [place] [address]     Radiology Studies: No results found.  Marzetta Board, MD, PhD [facility]  Between 7 am - 7 pm I am available, please contact me via Amion (for emergencies) or Securechat (non urgent messages)  Between 7 pm - 7 am I am not available, please contact night coverage MD/APP via Amion

## 2021-02-12 DIAGNOSIS — R7881 Bacteremia: Secondary | ICD-10-CM

## 2021-02-12 DIAGNOSIS — A0472 Enterocolitis due to Clostridium difficile, not specified as recurrent: Secondary | ICD-10-CM

## 2021-02-12 LAB — BASIC METABOLIC PANEL
Anion gap: 7 (ref 5–15)
BUN: 17 mg/dL (ref 8–23)
CO2: 24 mmol/L (ref 22–32)
Calcium: 8.6 mg/dL — ABNORMAL LOW (ref 8.9–10.3)
Chloride: 105 mmol/L (ref 98–111)
Creatinine, Ser: 0.95 mg/dL (ref 0.44–1.00)
GFR, Estimated: >60 mL/min (ref >60)
Glucose, Bld: 84 mg/dL (ref 70–99)
Potassium: 4.4 mmol/L (ref 3.5–5.1)
Sodium: 136 mmol/L (ref 135–145)

## 2021-02-12 LAB — CBC
HCT: 34.3 % — ABNORMAL LOW (ref 36.0–46.0)
Hemoglobin: 10.9 g/dL — ABNORMAL LOW (ref 12.0–15.0)
MCH: 29.7 pg (ref 26.0–34.0)
MCHC: 31.8 g/dL (ref 30.0–36.0)
MCV: 93.5 fL (ref 80.0–100.0)
Platelets: 146 10*3/uL — ABNORMAL LOW (ref 150–400)
RBC: 3.67 MIL/uL — ABNORMAL LOW (ref 3.87–5.11)
RDW: 13.4 % (ref 11.5–15.5)
WBC: 3.4 10*3/uL — ABNORMAL LOW (ref 4.0–10.5)
nRBC: 0 % (ref 0.0–0.2)

## 2021-02-12 MED ORDER — PROCHLORPERAZINE EDISYLATE 10 MG/2ML IJ SOLN: 10.0000 mg | Freq: Four times a day (QID) | INTRAMUSCULAR | Status: DC | PRN

## 2021-02-12 MED ORDER — KETOROLAC TROMETHAMINE 15 MG/ML IJ SOLN
15.0000 mg | Freq: Four times a day (QID) | INTRAMUSCULAR | Status: DC | PRN
  Administered 2021-02-13 (×2): 15 mg via INTRAVENOUS
  Filled 2021-02-12 (×2): qty 1

## 2021-02-12 MED ORDER — KETOROLAC TROMETHAMINE 15 MG/ML IJ SOLN
15.0000 mg | Freq: Once | INTRAMUSCULAR | Status: AC
  Administered 2021-02-12: 15 mg via INTRAVENOUS
  Filled 2021-02-12: qty 1

## 2021-02-12 MED ORDER — DIPHENHYDRAMINE HCL 25 MG PO CAPS: 25.0000 mg | ORAL_CAPSULE | Freq: Four times a day (QID) | ORAL | Status: DC | PRN

## 2021-02-12 NOTE — Progress Notes (Signed)
Patient ID: Megan Brewer, female   DOB: 1945/11/24, 75 y.o.   MRN: [id number]  PROGRESS NOTE    Megan Brewer  ERX:[id number] DOB: October 19, 1945 DOA: 02/04/2021 PCP: [name], NP   Brief Narrative:  75 year old female with bipolar disorder, lung cancer, C. difficile colitis, hypogammaglobulinemia, hypothyroidism, gastric bypass, recent hospitalization for gram-negative bacteremia and C. difficile infection treated with oral vancomycin and 2 weeks of IV cefepime which was completed on 01/29/2021 presented as a direct admission from the ID clinic with rigors, malaise.  She was admitted for persistent gram-negative bacteremia and started on IV meropenem and oral vancomycin was continued for C. difficile.  ID was consulted.  Assessment & Plan:   Concern for persistent gram-negative bacteremia -Patient completed 2 weeks of IV cefepime on 01/29/2021 for Cram negative/Gram variable bacteremia: Sensitivities and identification of organism on 01/14/2021 is still pending from [facility] -Blood cultures from 02/04/2021 growing gram-negative rods: ID and sensitivities pending from [facility].  Status post port removal on 02/07/2021 -Repeat blood cultures from 02/04/2021: No growth so far.  TTE negative for vegetations -ID is recommending to continue meropenem till 02/14/2021 which will complete 7 days post removal of port  C. difficile colitis, second episode -Diarrhea improving.  Continue oral vancomycin and will need prolonged taper on discharge as per ID.  B12 deficiency -Received IM B12 on 02/08/2021.  Will need oral supplementation on discharge  History of hypogammaglobinemia -receives Gammagard every 3 weeks, last dose 6/17  Migraine headaches -Gets Aimovig every monthly -We will use Toradol, Compazine and Benadryl as needed  Hypothyroidism -Continue Synthroid  Overactive bladder -Continue oxybutynin  Urinary retention -continue Flomax  History of gastric bypass  Essential  hypertension -Continue Norvasc  DVT prophylaxis: Lovenox Code Status: Full Family Communication: None at bedside Disposition Plan: Status is: Inpatient  Remains inpatient appropriate because:Inpatient level of care appropriate due to severity of illness  Dispo: The patient is from: Home              Anticipated d/c is to: Home              Patient currently is not medically stable to d/c.   Difficult to place patient No  Consultants: ID  Procedures: Echo  Antimicrobials:  Anti-infectives (From admission, onward)    Start     Dose/Rate Route Frequency Ordered Stop   02/11/21 1030  fluconazole (DIFLUCAN) tablet 150 mg        150 mg Oral  Once 02/11/21 0933 02/11/21 1001   02/11/21 1000  fluconazole (DIFLUCAN) tablet 100 mg  Status:  Discontinued        100 mg Oral  Once 02/11/21 0825 02/11/21 0933   02/05/21 2200  meropenem (MERREM) 1 g in sodium chloride 0.9 % 100 mL IVPB        1 g 200 mL/hr over 30 Minutes Intravenous Every 12 hours 02/05/21 1127 02/14/21 2359   02/04/21 1915  vancomycin (VANCOCIN) capsule 125 mg       Note to Pharmacy: Patient taking differently: Start date: 01/15/21     125 mg Oral 4 times daily 02/04/21 1820     02/04/21 1900  ceFEPIme (MAXIPIME) 2 g in sodium chloride 0.9 % 100 mL IVPB  Status:  Discontinued        2 g 200 mL/hr over 30 Minutes Intravenous Every 12 hours 02/04/21 1802 02/05/21 1127        Subjective: Patient seen and examined at bedside.  Feels better and states  that her diarrhea is improving.  No overnight fever, vomiting, shortness of breath reported.  Objective: Vitals:   02/11/21 0959 02/11/21 1424 02/11/21 1959 02/12/21 0507  BP: 132/70 (!) 105/52 (!) 123/51 (!) 112/53  Pulse:  75 89 64  Resp:  20 20 20   Temp:  98.2 F (36.8 C) 98.5 F (36.9 C) 98.4 F (36.9 C)  TempSrc:  Oral Oral Oral  SpO2:  100% 96% 99%  Weight:      Height:        Intake/Output Summary (Last 24 hours) at 02/12/2021 1121 Last data filed at  02/12/2021 0600 Gross per 24 hour  Intake 1280 ml  Output --  Net 1280 ml   Filed Weights   02/08/21 0500 02/09/21 0500 02/11/21 0500  Weight: 57.1 kg 58.2 kg 58.3 kg    Examination:  General exam: Appears calm and comfortable.  Currently on room air.  Looks chronically ill. Respiratory system: Bilateral decreased breath sounds at bases Cardiovascular system: S1 & S2 heard, Rate controlled Gastrointestinal system: Abdomen is nondistended, soft and nontender. Normal bowel sounds heard. Extremities: No cyanosis, clubbing, edema  Central nervous system: Alert and oriented. No focal neurological deficits. Moving extremities Skin: No rashes, lesions or ulcers Psychiatry: Judgement and insight appear normal. Mood & affect appropriate.     Data Reviewed: I have personally reviewed following labs and imaging studies  CBC: Recent Labs  Lab 02/08/21 0625 02/10/21 0459 02/11/21 0444 02/12/21 0515  WBC 4.0 3.2* 4.0 3.4*  HGB 11.9* 11.3* 10.9* 10.9*  HCT 37.1 35.2* 34.3* 34.3*  MCV 92.5 92.6 93.0 93.5  PLT 147* 147* 143* 106*   Basic Metabolic Panel: Recent Labs  Lab 02/08/21 0625 02/10/21 0459 02/11/21 0444 02/12/21 0515  NA 139 140 138 136  K 4.7 4.4 4.5 4.4  CL 105 109 109 105  CO2 30 26 24 24   GLUCOSE 91 88 84 84  BUN 15 14 14 17   CREATININE 0.89 0.68 0.75 0.95  CALCIUM 9.1 8.8* 8.6* 8.6*   GFR: Estimated Creatinine Clearance: 42.3 mL/min (by C-G formula based on SCr of 0.95 mg/dL). Liver Function Tests: No results for input(s): AST, ALT, ALKPHOS, BILITOT, PROT, ALBUMIN in the last 168 hours. No results for input(s): LIPASE, AMYLASE in the last 168 hours. No results for input(s): AMMONIA in the last 168 hours. Coagulation Profile: No results for input(s): INR, PROTIME in the last 168 hours. Cardiac Enzymes: No results for input(s): CKTOTAL, CKMB, CKMBINDEX, TROPONINI in the last 168 hours. BNP (last 3 results) No results for input(s): PROBNP in the last 8760  hours. HbA1C: No results for input(s): HGBA1C in the last 72 hours. CBG: No results for input(s): GLUCAP in the last 168 hours. Lipid Profile: No results for input(s): CHOL, HDL, LDLCALC, TRIG, CHOLHDL, LDLDIRECT in the last 72 hours. Thyroid Function Tests: No results for input(s): TSH, T4TOTAL, FREET4, T3FREE, THYROIDAB in the last 72 hours. Anemia Panel: No results for input(s): VITAMINB12, FOLATE, FERRITIN, TIBC, IRON, RETICCTPCT in the last 72 hours. Sepsis Labs: Recent Labs  Lab 02/06/21 0500  PROCALCITON <0.10    Recent Results (from the past 240 hour(s))  Culture, blood (routine x 2)     Status: None   Collection Time: 02/04/21  5:12 PM   Specimen: BLOOD  Result Value Ref Range Status   Specimen Description   Final    BLOOD RIGHT ARM Performed at [facility] [address], [place], [place] [address]    Special Requests  Final    BOTTLES DRAWN AEROBIC AND ANAEROBIC Blood Culture adequate volume Performed at [facility] [address], [place], [place] [address]    Culture   Final    NO GROWTH 5 DAYS Performed at [facility], [place] [address], [place], [place] [address]    Report Status 02/09/2021 FINAL  Final  Culture, blood (routine x 2)     Status: None   Collection Time: 02/04/21  5:12 PM   Specimen: BLOOD  Result Value Ref Range Status   Specimen Description   Final    BLOOD LEFT ANTECUBITAL Performed at [facility] [address], [place], [place] [address]    Special Requests   Final    BOTTLES DRAWN AEROBIC ONLY Blood Culture adequate volume Performed at [facility] [address], [place], [place] [address]    Culture   Final    NO GROWTH 5 DAYS Performed at [facility], [place] [address], [place], [place] [address]    Report Status 02/09/2021 FINAL  Final  Resp Panel by RT-PCR (Flu A&B, Covid) Nasopharyngeal Swab     Status: None   Collection Time:  02/04/21  5:56 PM   Specimen: Nasopharyngeal Swab; Nasopharyngeal(NP) swabs in vial transport medium  Result Value Ref Range Status   SARS Coronavirus 2 by RT PCR NEGATIVE NEGATIVE Final    Comment: (NOTE) SARS-CoV-2 target nucleic acids are NOT DETECTED.  The SARS-CoV-2 RNA is generally detectable in upper respiratory specimens during the acute phase of infection. The lowest concentration of SARS-CoV-2 viral copies this assay can detect is 138 copies/mL. A negative result does not preclude SARS-Cov-2 infection and should not be used as the sole basis for treatment or other patient management decisions. A negative result may occur with  improper specimen collection/handling, submission of specimen other than nasopharyngeal swab, presence of viral mutation(s) within the areas targeted by this assay, and inadequate number of viral copies(<138 copies/mL). A negative result must be combined with clinical observations, patient history, and epidemiological information. The expected result is Negative.  Fact Sheet for Patients:  EntrepreneurPulse.com.au  Fact Sheet for Healthcare Providers:  IncredibleEmployment.be  This test is no t yet approved or cleared by the [place] FDA and  has been authorized for detection and/or diagnosis of SARS-CoV-2 by FDA under an Emergency Use Authorization (EUA). This EUA will remain  in effect (meaning this test can be used) for the duration of the COVID-19 declaration under Section 564(b)(1) of the Act, 21 U.S.C.section 360bbb-3(b)(1), unless the authorization is terminated  or revoked sooner.       Influenza A by PCR NEGATIVE NEGATIVE Final   Influenza B by PCR NEGATIVE NEGATIVE Final    Comment: (NOTE) The Xpert Xpress SARS-CoV-2/FLU/RSV plus assay is intended as an aid in the diagnosis of influenza from Nasopharyngeal swab specimens and should not be used as a sole basis for treatment. Nasal washings  and aspirates are unacceptable for Xpert Xpress SARS-CoV-2/FLU/RSV testing.  Fact Sheet for Patients: EntrepreneurPulse.com.au  Fact Sheet for Healthcare Providers: IncredibleEmployment.be  This test is not yet approved or cleared by the [place] FDA and has been authorized for detection and/or diagnosis of SARS-CoV-2 by FDA under an Emergency Use Authorization (EUA). This EUA will remain in effect (meaning this test can be used) for the duration of the COVID-19 declaration under Section 564(b)(1) of the Act, 21 U.S.C. section 360bbb-3(b)(1), unless the authorization is terminated or revoked.  Performed  at [facility], [place] [address], [place], [place] [address]   Culture, blood (single)     Status: None (Preliminary result)   Collection Time: 02/04/21  6:37 PM   Specimen: BLOOD  Result Value Ref Range Status   Specimen Description   Final    BLOOD PORTA CATH LEFT CHEST Performed at [facility] [address] [place], [place] [address]    Special Requests   Final    BOTTLES DRAWN AEROBIC AND ANAEROBIC Blood Culture adequate volume Performed at [facility] [address] [place], [place] [address]    Culture  Setup Time   Final    GRAM NEGATIVE RODS IN BOTH AEROBIC AND ANAEROBIC BOTTLES CRITICAL RESULT CALLED TO, READ BACK BY AND VERIFIED WITH: Melodye Ped [facility] 5176 02/05/21 A BROWNING    Culture   Final    GRAM NEGATIVE RODS SENT TO [facility] FOR IDENTIFICATION Sent to [facility] for further susceptibility testing. Performed at [facility], [place] [address] [place], [place] [address]    Report Status PENDING  Incomplete  Blood Culture ID Panel (Reflexed)     Status: None   Collection Time: 02/04/21  6:37 PM  Result Value Ref Range Status   Enterococcus faecalis NOT DETECTED NOT DETECTED Final   Enterococcus Faecium NOT DETECTED NOT DETECTED Final   Listeria  monocytogenes NOT DETECTED NOT DETECTED Final   Staphylococcus species NOT DETECTED NOT DETECTED Final   Staphylococcus aureus (BCID) NOT DETECTED NOT DETECTED Final   Staphylococcus epidermidis NOT DETECTED NOT DETECTED Final   Staphylococcus lugdunensis NOT DETECTED NOT DETECTED Final   Streptococcus species NOT DETECTED NOT DETECTED Final   Streptococcus agalactiae NOT DETECTED NOT DETECTED Final   Streptococcus pneumoniae NOT DETECTED NOT DETECTED Final   Streptococcus pyogenes NOT DETECTED NOT DETECTED Final   A.calcoaceticus-baumannii NOT DETECTED NOT DETECTED Final   Bacteroides fragilis NOT DETECTED NOT DETECTED Final   Enterobacterales NOT DETECTED NOT DETECTED Final   Enterobacter cloacae complex NOT DETECTED NOT DETECTED Final   Escherichia coli NOT DETECTED NOT DETECTED Final   Klebsiella aerogenes NOT DETECTED NOT DETECTED Final   Klebsiella oxytoca NOT DETECTED NOT DETECTED Final   Klebsiella pneumoniae NOT DETECTED NOT DETECTED Final   Proteus species NOT DETECTED NOT DETECTED Final   Salmonella species NOT DETECTED NOT DETECTED Final   Serratia marcescens NOT DETECTED NOT DETECTED Final   Haemophilus influenzae NOT DETECTED NOT DETECTED Final   Neisseria meningitidis NOT DETECTED NOT DETECTED Final   Pseudomonas aeruginosa NOT DETECTED NOT DETECTED Final   Stenotrophomonas maltophilia NOT DETECTED NOT DETECTED Final   Candida albicans NOT DETECTED NOT DETECTED Final   Candida auris NOT DETECTED NOT DETECTED Final   Candida glabrata NOT DETECTED NOT DETECTED Final   Candida krusei NOT DETECTED NOT DETECTED Final   Candida parapsilosis NOT DETECTED NOT DETECTED Final   Candida tropicalis NOT DETECTED NOT DETECTED Final   Cryptococcus neoformans/gattii NOT DETECTED NOT DETECTED Final    Comment: Performed at [facility], [address], [place], [place] [address]  Culture, Urine     Status: None   Collection Time: 02/05/21  2:00 AM   Specimen: Urine, Clean Catch   Result Value Ref Range Status   Specimen Description   Final    URINE, CLEAN CATCH Performed at [facility], [place] [address] [place], [place] [address]    Special Requests   Final    NONE Performed at  [facility], [place] [address] [place], [place] [address]    Culture   Final    NO GROWTH Performed at [facility], [place] [address] [place], [place] [address]    Report Status 02/06/2021 FINAL  Final  Surgical pcr screen     Status: Abnormal   Collection Time: 02/07/21  8:03 AM   Specimen: Nasal Mucosa; Nasal Swab  Result Value Ref Range Status   MRSA, PCR NEGATIVE NEGATIVE Final   Staphylococcus aureus POSITIVE (A) NEGATIVE Final    Comment: (NOTE) The Xpert SA Assay (FDA approved for NASAL specimens in patients 60 years of age and older), is one component of a comprehensive surveillance program. It is not intended to diagnose infection nor to guide or monitor treatment. Performed at [facility], [place] [address], [place],  [address]          Radiology Studies: No results found.      Scheduled Meds:  amLODipine  10 mg Oral Daily   calcium citrate  200 mg of elemental calcium Oral Daily   cholecalciferol  1,000 Units Oral Daily   clotrimazole  1 Applicatorful Vaginal QHS   cycloSPORINE  1 drop Both Eyes BID   enoxaparin (LOVENOX) injection  40 mg Subcutaneous Q24H   Erenumab-aooe  70 mg Subcutaneous Q30 days   estradiol  1 Applicatorful Vaginal Once per day on Mon Wed Fri   ferrous sulfate  650 mg Oral Q breakfast   ketotifen  1 drop Both Eyes Daily   lamoTRIgine  200 mg Oral QHS   levothyroxine  75 mcg Oral Q0600   magnesium oxide  400 mg Oral Daily   mirtazapine  30 mg Oral QHS   oxybutynin  2.5 mg Oral BID   pantoprazole  40 mg Oral Daily   potassium chloride SA  40 mEq Oral Daily   rOPINIRole  1 mg Oral QHS   saccharomyces boulardii  250 mg Oral BID   tamsulosin  0.4 mg Oral Daily    vancomycin  125 mg Oral QID   zinc oxide   Topical BID   Continuous Infusions:  meropenem (MERREM) IV 1 g (02/12/21 1038)          Aline August, MD [facility] 02/12/2021, 11:21 AM

## 2021-02-12 NOTE — Progress Notes (Signed)
[facility] Infectious Diseases Follow Up Note  Patient Identification: Patient Name: Megan Brewer MRN: [id number] [place] Date: 02/04/2021  4:43 PM Age: 75 y.o.Today's Date: 02/12/2021   Reason for Visit: Gram-negative bacteremia and C. difficile diarrhea  Active Problems:   Rigors  Antibiotics: PO Vancomycin 6/22 -current                    cefepime 6/21                    Meropenem 6/22-current   Lines/Tubes: Port-A-Cath has been removed on 02/07/21  Interval Events: Continues to be afebrile, no leukocytosis, no diarrhea since 9:30 PM at 6/27   Assessment Recent gram-negative/Gram variable bacteremia in 5/31 status post 2 weeks of cefepime.  Sensitivities and identification  of organism on 5/31 is still pending from [facility] updates yet  Blood cx from port 6/21 growing GNR. Status post implanted port removal on 6/24.   ID and sensitivities  6/21 pending from [facility].   Repeat blood cx 6/21 No growth in 5 days  TTE is negative for vegetations  C. difficile diarrhea (2nd episode)- No diarrhea since 6/27 evening.  Denies nausea vomiting and abdominal pain.   Recommendations Continue meropenem until 02/14/2021.  This will complete 7 days post removal of implanted port on 6/24  Continue PO Vancomycin as below 125 mg orally 4 times daily for 14 days, then 125 mg orally 2 times daily for 7 days, then 125 mg orally once daily for 7 days, then 125 mg orally every 2 days for 2 weeks  IV pharmacist Dorothea Ogle will check on GNR identification and sensitivity peripherally.  Otherwise I will sign off for now  P.o. fluconazole for vaginal itching  Rest of the management as per the primary team. Thank you for the consult. Please page with pertinent questions or concerns. ______________________________________________________________________ Subjective patient seen and examined at the bedside.  She says she did not have any bowel  movement since 6/27 evening.  Denies nausea vomiting and abdominal pain.  She feels better today  Vitals BP (!) 112/53 (BP Location: Right Arm)   Pulse 64   Temp 98.4 F (36.9 C) (Oral)   Resp 20   Ht 5\' 2"  (1.575 m)   Wt 58.3 kg   SpO2 99%   BMI 23.51 kg/m     Physical Exam Constitutional: Not in acute distress, sitting up in chair    Comments:   Cardiovascular:     Rate and Rhythm: Normal rate and regular rhythm.     Heart sounds:   Pulmonary:     Effort: Pulmonary effort is normal.     Comments:   Abdominal:     Palpations: Abdomen is soft.     Tenderness: Nondistended, nontender  Musculoskeletal:        General: No swelling or tenderness.   Skin:    Comments: No lesions or rashes  Neurological:     General: No focal deficit present.   Psychiatric:        Mood and Affect: Mood normal.   Pertinent Microbiology Results for orders placed or performed during the hospital encounter of 02/04/21  Culture, blood (routine x 2)     Status: None   Collection Time: 02/04/21  5:12 PM   Specimen: BLOOD  Result Value Ref Range Status   Specimen Description   Final    BLOOD RIGHT ARM Performed at [facility] [address] [place], [place] [address]  Special Requests   Final    BOTTLES DRAWN AEROBIC AND ANAEROBIC Blood Culture adequate volume Performed at [facility] [address], [place], [place] [address]    Culture   Final    NO GROWTH 5 DAYS Performed at [facility], [place] [address], [place], [place] [address]    Report Status 02/09/2021 FINAL  Final  Culture, blood (routine x 2)     Status: None   Collection Time: 02/04/21  5:12 PM   Specimen: BLOOD  Result Value Ref Range Status   Specimen Description   Final    BLOOD LEFT ANTECUBITAL Performed at [facility] [address] [place], [place] [address]    Special Requests   Final    BOTTLES DRAWN AEROBIC ONLY Blood Culture adequate  volume Performed at [facility] [address], [place], [place] [address]    Culture   Final    NO GROWTH 5 DAYS Performed at [facility], [place] [address] [place],  [address]    Report Status 02/09/2021 FINAL  Final  Resp Panel by RT-PCR (Flu A&B, Covid) Nasopharyngeal Swab     Status: None   Collection Time: 02/04/21  5:56 PM   Specimen: Nasopharyngeal Swab; Nasopharyngeal(NP) swabs in vial transport medium  Result Value Ref Range Status   SARS Coronavirus 2 by RT PCR NEGATIVE NEGATIVE Final    Comment: (NOTE) SARS-CoV-2 target nucleic acids are NOT DETECTED.  The SARS-CoV-2 RNA is generally detectable in upper respiratory specimens during the acute phase of infection. The lowest concentration of SARS-CoV-2 viral copies this assay can detect is 138 copies/mL. A negative result does not preclude SARS-Cov-2 infection and should not be used as the sole basis for treatment or other patient management decisions. A negative result may occur with  improper specimen collection/handling, submission of specimen other than nasopharyngeal swab, presence of viral mutation(s) within the areas targeted by this assay, and inadequate number of viral copies(<138 copies/mL). A negative result must be combined with clinical observations, patient history, and epidemiological information. The expected result is Negative.  Fact Sheet for Patients:  EntrepreneurPulse.com.au  Fact Sheet for Healthcare Providers:  IncredibleEmployment.be  This test is no t yet approved or cleared by the [place] FDA and  has been authorized for detection and/or diagnosis of SARS-CoV-2 by FDA under an Emergency Use Authorization (EUA). This EUA will remain  in effect (meaning this test can be used) for the duration of the COVID-19 declaration under Section 564(b)(1) of the Act, 21 U.S.C.section 360bbb-3(b)(1), unless the authorization is  terminated  or revoked sooner.       Influenza A by PCR NEGATIVE NEGATIVE Final   Influenza B by PCR NEGATIVE NEGATIVE Final    Comment: (NOTE) The Xpert Xpress SARS-CoV-2/FLU/RSV plus assay is intended as an aid in the diagnosis of influenza from Nasopharyngeal swab specimens and should not be used as a sole basis for treatment. Nasal washings and aspirates are unacceptable for Xpert Xpress SARS-CoV-2/FLU/RSV testing.  Fact Sheet for Patients: EntrepreneurPulse.com.au  Fact Sheet for Healthcare Providers: IncredibleEmployment.be  This test is not yet approved or cleared by the [place] FDA and has been authorized for detection and/or diagnosis of SARS-CoV-2 by FDA under an Emergency Use Authorization (EUA). This EUA will remain in effect (meaning this test can be used) for the duration of the COVID-19 declaration under Section 564(b)(1) of the Act, 21 U.S.C. section 360bbb-3(b)(1), unless the authorization is terminated  or revoked.  Performed at [facility], [place] [address], [place], [place] [address]   Culture, blood (single)     Status: None (Preliminary result)   Collection Time: 02/04/21  6:37 PM   Specimen: BLOOD  Result Value Ref Range Status   Specimen Description   Final    BLOOD PORTA CATH LEFT CHEST Performed at [facility] [address], [place], [place] [address]    Special Requests   Final    BOTTLES DRAWN AEROBIC AND ANAEROBIC Blood Culture adequate volume Performed at [facility] [address] [place], [place] [address]    Culture  Setup Time   Final    GRAM NEGATIVE RODS IN BOTH AEROBIC AND ANAEROBIC BOTTLES CRITICAL RESULT CALLED TO, READ BACK BY AND VERIFIED WITH: Melodye Ped [facility] 5053 02/05/21 A BROWNING    Culture   Final    GRAM NEGATIVE RODS SENT TO [facility] FOR IDENTIFICATION Sent to [facility] for further susceptibility testing. Performed at  [facility], [place] [address], [place], [place] [address]    Report Status PENDING  Incomplete  Blood Culture ID Panel (Reflexed)     Status: None   Collection Time: 02/04/21  6:37 PM  Result Value Ref Range Status   Enterococcus faecalis NOT DETECTED NOT DETECTED Final   Enterococcus Faecium NOT DETECTED NOT DETECTED Final   Listeria monocytogenes NOT DETECTED NOT DETECTED Final   Staphylococcus species NOT DETECTED NOT DETECTED Final   Staphylococcus aureus (BCID) NOT DETECTED NOT DETECTED Final   Staphylococcus epidermidis NOT DETECTED NOT DETECTED Final   Staphylococcus lugdunensis NOT DETECTED NOT DETECTED Final   Streptococcus species NOT DETECTED NOT DETECTED Final   Streptococcus agalactiae NOT DETECTED NOT DETECTED Final   Streptococcus pneumoniae NOT DETECTED NOT DETECTED Final   Streptococcus pyogenes NOT DETECTED NOT DETECTED Final   A.calcoaceticus-baumannii NOT DETECTED NOT DETECTED Final   Bacteroides fragilis NOT DETECTED NOT DETECTED Final   Enterobacterales NOT DETECTED NOT DETECTED Final   Enterobacter cloacae complex NOT DETECTED NOT DETECTED Final   Escherichia coli NOT DETECTED NOT DETECTED Final   Klebsiella aerogenes NOT DETECTED NOT DETECTED Final   Klebsiella oxytoca NOT DETECTED NOT DETECTED Final   Klebsiella pneumoniae NOT DETECTED NOT DETECTED Final   Proteus species NOT DETECTED NOT DETECTED Final   Salmonella species NOT DETECTED NOT DETECTED Final   Serratia marcescens NOT DETECTED NOT DETECTED Final   Haemophilus influenzae NOT DETECTED NOT DETECTED Final   Neisseria meningitidis NOT DETECTED NOT DETECTED Final   Pseudomonas aeruginosa NOT DETECTED NOT DETECTED Final   Stenotrophomonas maltophilia NOT DETECTED NOT DETECTED Final   Candida albicans NOT DETECTED NOT DETECTED Final   Candida auris NOT DETECTED NOT DETECTED Final   Candida glabrata NOT DETECTED NOT DETECTED Final   Candida krusei NOT DETECTED NOT DETECTED Final   Candida  parapsilosis NOT DETECTED NOT DETECTED Final   Candida tropicalis NOT DETECTED NOT DETECTED Final   Cryptococcus neoformans/gattii NOT DETECTED NOT DETECTED Final    Comment: Performed at [facility], [address] [place], [place] [address]  Culture, Urine     Status: None   Collection Time: 02/05/21  2:00 AM   Specimen: Urine, Clean Catch  Result Value Ref Range Status   Specimen Description   Final    URINE, CLEAN CATCH Performed at [facility], [place] [address] [place], [place] [address]    Special Requests   Final  NONE Performed at [facility], [place] [address] [place], [place] [address]    Culture   Final    NO GROWTH Performed at [facility], [place] [address] [place], [place] [address]    Report Status 02/06/2021 FINAL  Final  Surgical pcr screen     Status: Abnormal   Collection Time: 02/07/21  8:03 AM   Specimen: Nasal Mucosa; Nasal Swab  Result Value Ref Range Status   MRSA, PCR NEGATIVE NEGATIVE Final   Staphylococcus aureus POSITIVE (A) NEGATIVE Final    Comment: (NOTE) The Xpert SA Assay (FDA approved for NASAL specimens in patients 58 years of age and older), is one component of a comprehensive surveillance program. It is not intended to diagnose infection nor to guide or monitor treatment. Performed at [facility], [place] [address] [place], [place] [address]    Pertinent Lab. CBC Latest Ref Rng & Units 02/12/2021 02/11/2021 02/10/2021  WBC 4.0 - 10.5 K/uL 3.4(L) 4.0 3.2(L)  Hemoglobin 12.0 - 15.0 g/dL 10.9(L) 10.9(L) 11.3(L)  Hematocrit 36.0 - 46.0 % 34.3(L) 34.3(L) 35.2(L)  Platelets 150 - 400 K/uL 146(L) 143(L) 147(L)   CMP Latest Ref Rng & Units 02/12/2021 02/11/2021 02/10/2021  Glucose 70 - 99 mg/dL 84 84 88  BUN 8 - 23 mg/dL 17 14 14   Creatinine 0.44 - 1.00 mg/dL 0.95 0.75 0.68  Sodium 135 - 145 mmol/L 136 138 140  Potassium 3.5 - 5.1 mmol/L 4.4 4.5 4.4  Chloride 98 - 111 mmol/L  105 109 109  CO2 22 - 32 mmol/L 24 24 26   Calcium 8.9 - 10.3 mg/dL 8.6(L) 8.6(L) 8.8(L)  Total Protein 6.5 - 8.1 g/dL - - -  Total Bilirubin 0.3 - 1.2 mg/dL - - -  Alkaline Phos 38 - 126 U/L - - -  AST 15 - 41 U/L - - -  ALT 0 - 44 U/L - - -    Pertinent Imaging today Plain films and CT images have been personally visualized and interpreted; radiology reports have been reviewed. Decision making incorporated into the Impression / Recommendations.  I have spent more than 35 minutes for this patient encounter including review of prior medical records, coordination of care  with greater than 50% of time being face to face/counseling and discussing diagnostics/treatment plan with the patient/family.  Electronically signed by:   Rosiland Oz, MD Infectious Disease Physician [facility] Infectious Disease Pager: [phone]

## 2021-02-12 NOTE — Progress Notes (Signed)
   02/12/21 1230  Mobility  Activity Ambulated in hall  Level of Assistance Modified independent, requires aide device or extra time  Assistive Device Other (Comment) (IV Pole)  Distance Ambulated (ft) 400 ft  Mobility Ambulated with assistance in hallway  Mobility Response Tolerated well  Mobility performed by Mobility specialist  $Mobility charge 1 Mobility    Pt eagerly ambulated 49ft in hallway while [place], verbal cues utilized to keep pt from overexerting herself. Pt did not c/o of fatigue or dizziness, but SOB was noted at the end of session. Pt left in bed with call bell at side and no bed alarm on per nurse.   [place] Specialist Acute Rehabilitation Services Office: [phone] 02/12/21, 12:36 PM

## 2021-02-12 NOTE — Progress Notes (Signed)
    BRIEF OVERNIGHT PROGRESS REPORT  Patient complaint of migraine headache and states after inquiry that she is "not allergic to Toradol" Also was reminded to have her husband bring her specific migraine Rx from home today.   Gershon Cull MSNA ACNPC-AG Acute Care Nurse Practitioner [place]

## 2021-02-13 LAB — BACTERIAL ORGANISM REFLEX

## 2021-02-13 MED ORDER — DEXAMETHASONE SODIUM PHOSPHATE 10 MG/ML IJ SOLN
10.0000 mg | Freq: Once | INTRAMUSCULAR | Status: AC
  Administered 2021-02-13: 10 mg via INTRAVENOUS
  Filled 2021-02-13: qty 1

## 2021-02-13 NOTE — Progress Notes (Signed)
Patient ID: Megan Brewer, female   DOB: [birth year], 75 y.o.   MRN: [id number]  PROGRESS NOTE    Jillienne Egner Wiltse  KDT:[id number] DOB: 05-Mar-1946 DOA: 02/04/2021 PCP: [name], NP   Brief Narrative:  75 year old female with bipolar disorder, lung cancer, C. difficile colitis, hypogammaglobulinemia, hypothyroidism, gastric bypass, recent hospitalization for gram-negative bacteremia and C. difficile infection treated with oral vancomycin and 2 weeks of IV cefepime which was completed on 01/29/2021 presented as a direct admission from the ID clinic with rigors, malaise.  She was admitted for persistent gram-negative bacteremia and started on IV meropenem and oral vancomycin was continued for C. difficile.  ID was consulted.  Assessment & Plan:   Concern for persistent gram-negative bacteremia -Patient completed 2 weeks of IV cefepime on 01/29/2021 for Cram negative/Gram variable bacteremia: Sensitivities and identification of organism on 01/14/2021 is still pending from [facility] -Blood cultures from 02/04/2021 growing gram-negative rods: ID and sensitivities pending from [facility].  Status post port removal on 02/07/2021 -Repeat blood cultures from 02/04/2021: No growth so far.  TTE negative for vegetations -ID is recommending to continue meropenem till 02/14/2021 which will complete 7 days post removal of port -Currently afebrile and hemodynamically stable.  C. difficile colitis, second episode -Diarrhea improving.  Continue oral vancomycin and will need prolonged taper on discharge as per ID.  B12 deficiency -Received IM B12 on 02/08/2021.  Will need oral supplementation on discharge  History of hypogammaglobinemia -receives Gammagard every 3 weeks, last dose 6/17  Migraine headaches -Gets Aimovig every monthly -We will use Toradol, Compazine and Benadryl as needed  Hypothyroidism -Continue Synthroid  Overactive bladder -Continue oxybutynin  Urinary retention -continue  Flomax  History of gastric bypass  Essential hypertension -Continue Norvasc  DVT prophylaxis: Lovenox Code Status: Full Family Communication: None at bedside Disposition Plan: Status is: Inpatient  Remains inpatient appropriate because:Inpatient level of care appropriate due to severity of illness  Dispo: The patient is from: Home              Anticipated d/c is to: Home in 1 to 2 days if clinically stable              Patient currently is not medically stable to d/c.   Difficult to place patient No  Consultants: ID  Procedures: Echo  Antimicrobials:  Anti-infectives (From admission, onward)    Start     Dose/Rate Route Frequency Ordered Stop   02/11/21 1030  fluconazole (DIFLUCAN) tablet 150 mg        150 mg Oral  Once 02/11/21 0933 02/11/21 1001   02/11/21 1000  fluconazole (DIFLUCAN) tablet 100 mg  Status:  Discontinued        100 mg Oral  Once 02/11/21 0825 02/11/21 0933   02/05/21 2200  meropenem (MERREM) 1 g in sodium chloride 0.9 % 100 mL IVPB        1 g 200 mL/hr over 30 Minutes Intravenous Every 12 hours 02/05/21 1127 02/14/21 2359   02/04/21 1915  vancomycin (VANCOCIN) capsule 125 mg       Note to Pharmacy: Patient taking differently: Start date: 01/15/21     125 mg Oral 4 times daily 02/04/21 1820     02/04/21 1900  ceFEPIme (MAXIPIME) 2 g in sodium chloride 0.9 % 100 mL IVPB  Status:  Discontinued        2 g 200 mL/hr over 30 Minutes Intravenous Every 12 hours 02/04/21 1802 02/05/21 1127  Subjective: Patient seen and examined at bedside.  Denies any overnight fever, worsening shortness of breath, abdominal pain or vomiting.  She had some diarrhea yesterday.  States that she is having migraine headaches. Objective: Vitals:   02/12/21 0507 02/12/21 2004 02/13/21 0349 02/13/21 0500  BP: (!) 112/53 (!) 110/53 (!) 112/50   Pulse: 64 79 70   Resp: 20 15 18    Temp: 98.4 F (36.9 C) 98.1 F (36.7 C) 97.9 F (36.6 C)   TempSrc: Oral Oral Oral    SpO2: 99% 100% 100%   Weight:    60.1 kg  Height:        Intake/Output Summary (Last 24 hours) at 02/13/2021 0745 Last data filed at 02/12/2021 1252 Gross per 24 hour  Intake 590 ml  Output --  Net 590 ml    Filed Weights   02/09/21 0500 02/11/21 0500 02/13/21 0500  Weight: 58.2 kg 58.3 kg 60.1 kg    Examination:  General exam: On room air currently.  No distress.  Looks chronically ill. Respiratory system: Decreased bilateral breath sounds at bases Cardiovascular system: Rate controlled, S1-S2 heard  gastrointestinal system: Abdomen is slightly distended, soft and nontender.  Bowel sounds are heard Extremities: No clubbing or lower extremity edema Central nervous system: Awake and alert.  No focal neurological deficits.  Moves extremities  skin: No obvious ecchymosis/lesions Psychiatry: Mood, affect and judgment are appropriate    Data Reviewed: I have personally reviewed following labs and imaging studies  CBC: Recent Labs  Lab 02/08/21 0625 02/10/21 0459 02/11/21 0444 02/12/21 0515  WBC 4.0 3.2* 4.0 3.4*  HGB 11.9* 11.3* 10.9* 10.9*  HCT 37.1 35.2* 34.3* 34.3*  MCV 92.5 92.6 93.0 93.5  PLT 147* 147* 143* 146*    Basic Metabolic Panel: Recent Labs  Lab 02/08/21 0625 02/10/21 0459 02/11/21 0444 02/12/21 0515  NA 139 140 138 136  K 4.7 4.4 4.5 4.4  CL 105 109 109 105  CO2 30 26 24 24   GLUCOSE 91 88 84 84  BUN 15 14 14 17   CREATININE 0.89 0.68 0.75 0.95  CALCIUM 9.1 8.8* 8.6* 8.6*    GFR: Estimated Creatinine Clearance: 42.3 mL/min (by C-G formula based on SCr of 0.95 mg/dL). Liver Function Tests: No results for input(s): AST, ALT, ALKPHOS, BILITOT, PROT, ALBUMIN in the last 168 hours. No results for input(s): LIPASE, AMYLASE in the last 168 hours. No results for input(s): AMMONIA in the last 168 hours. Coagulation Profile: No results for input(s): INR, PROTIME in the last 168 hours. Cardiac Enzymes: No results for input(s): CKTOTAL, CKMB,  CKMBINDEX, TROPONINI in the last 168 hours. BNP (last 3 results) No results for input(s): PROBNP in the last 8760 hours. HbA1C: No results for input(s): HGBA1C in the last 72 hours. CBG: No results for input(s): GLUCAP in the last 168 hours. Lipid Profile: No results for input(s): CHOL, HDL, LDLCALC, TRIG, CHOLHDL, LDLDIRECT in the last 72 hours. Thyroid Function Tests: No results for input(s): TSH, T4TOTAL, FREET4, T3FREE, THYROIDAB in the last 72 hours. Anemia Panel: No results for input(s): VITAMINB12, FOLATE, FERRITIN, TIBC, IRON, RETICCTPCT in the last 72 hours. Sepsis Labs: No results for input(s): PROCALCITON, LATICACIDVEN in the last 168 hours.   Recent Results (from the past 240 hour(s))  Culture, blood (routine x 2)     Status: None   Collection Time: 02/04/21  5:12 PM   Specimen: BLOOD  Result Value Ref Range Status   Specimen Description   Final  BLOOD RIGHT ARM Performed at [facility] [place] [address], [place], [place] [address]    Special Requests   Final    BOTTLES DRAWN AEROBIC AND ANAEROBIC Blood Culture adequate volume Performed at [facility] [address] [place], [place] [address]    Culture   Final    NO GROWTH 5 DAYS Performed at [facility], [place] [address], [place], [place] [address]    Report Status 02/09/2021 FINAL  Final  Culture, blood (routine x 2)     Status: None   Collection Time: 02/04/21  5:12 PM   Specimen: BLOOD  Result Value Ref Range Status   Specimen Description   Final    BLOOD LEFT ANTECUBITAL Performed at [facility] [address] [place], [place] [address]    Special Requests   Final    BOTTLES DRAWN AEROBIC ONLY Blood Culture adequate volume Performed at [facility] [address], [place], [place] [address]    Culture   Final    NO GROWTH 5 DAYS Performed at [facility], [place] [address], [place], [place] [address]     Report Status 02/09/2021 FINAL  Final  Resp Panel by RT-PCR (Flu A&B, Covid) Nasopharyngeal Swab     Status: None   Collection Time: 02/04/21  5:56 PM   Specimen: Nasopharyngeal Swab; Nasopharyngeal(NP) swabs in vial transport medium  Result Value Ref Range Status   SARS Coronavirus 2 by RT PCR NEGATIVE NEGATIVE Final    Comment: (NOTE) SARS-CoV-2 target nucleic acids are NOT DETECTED.  The SARS-CoV-2 RNA is generally detectable in upper respiratory specimens during the acute phase of infection. The lowest concentration of SARS-CoV-2 viral copies this assay can detect is 138 copies/mL. A negative result does not preclude SARS-Cov-2 infection and should not be used as the sole basis for treatment or other patient management decisions. A negative result may occur with  improper specimen collection/handling, submission of specimen other than nasopharyngeal swab, presence of viral mutation(s) within the areas targeted by this assay, and inadequate number of viral copies(<138 copies/mL). A negative result must be combined with clinical observations, patient history, and epidemiological information. The expected result is Negative.  Fact Sheet for Patients:  EntrepreneurPulse.com.au  Fact Sheet for Healthcare Providers:  IncredibleEmployment.be  This test is no t yet approved or cleared by the [place] FDA and  has been authorized for detection and/or diagnosis of SARS-CoV-2 by FDA under an Emergency Use Authorization (EUA). This EUA will remain  in effect (meaning this test can be used) for the duration of the COVID-19 declaration under Section 564(b)(1) of the Act, 21 U.S.C.section 360bbb-3(b)(1), unless the authorization is terminated  or revoked sooner.       Influenza A by PCR NEGATIVE NEGATIVE Final   Influenza B by PCR NEGATIVE NEGATIVE Final    Comment: (NOTE) The Xpert Xpress SARS-CoV-2/FLU/RSV plus assay is intended as an aid in  the diagnosis of influenza from Nasopharyngeal swab specimens and should not be used as a sole basis for treatment. Nasal washings and aspirates are unacceptable for Xpert Xpress SARS-CoV-2/FLU/RSV testing.  Fact Sheet for Patients: EntrepreneurPulse.com.au  Fact Sheet for Healthcare Providers: IncredibleEmployment.be  This test is not yet approved or cleared by the [place] FDA and has been authorized for detection and/or diagnosis of SARS-CoV-2 by FDA under an Emergency Use Authorization (EUA). This EUA will remain in effect (meaning this test can be used) for the duration  of the COVID-19 declaration under Section 564(b)(1) of the Act, 21 U.S.C. section 360bbb-3(b)(1), unless the authorization is terminated or revoked.  Performed at [facility], [place] [address], [place], [place] [address]   Culture, blood (single)     Status: None (Preliminary result)   Collection Time: 02/04/21  6:37 PM   Specimen: BLOOD  Result Value Ref Range Status   Specimen Description   Final    BLOOD PORTA CATH LEFT CHEST Performed at [facility] [address], [place], [place] [address]    Special Requests   Final    BOTTLES DRAWN AEROBIC AND ANAEROBIC Blood Culture adequate volume Performed at [facility] [address] [place], [place] [address]    Culture  Setup Time   Final    GRAM NEGATIVE RODS IN BOTH AEROBIC AND ANAEROBIC BOTTLES CRITICAL RESULT CALLED TO, READ BACK BY AND VERIFIED WITH: Melodye Ped [facility] 3734 02/05/21 A BROWNING    Culture   Final    GRAM NEGATIVE RODS SENT TO [facility] FOR IDENTIFICATION Sent to [facility] for further susceptibility testing. Performed at [facility], [place] [address], [place], [place] [address]    Report Status PENDING  Incomplete  Blood Culture ID Panel (Reflexed)     Status: None   Collection Time: 02/04/21  6:37 PM  Result Value Ref Range Status    Enterococcus faecalis NOT DETECTED NOT DETECTED Final   Enterococcus Faecium NOT DETECTED NOT DETECTED Final   Listeria monocytogenes NOT DETECTED NOT DETECTED Final   Staphylococcus species NOT DETECTED NOT DETECTED Final   Staphylococcus aureus (BCID) NOT DETECTED NOT DETECTED Final   Staphylococcus epidermidis NOT DETECTED NOT DETECTED Final   Staphylococcus lugdunensis NOT DETECTED NOT DETECTED Final   Streptococcus species NOT DETECTED NOT DETECTED Final   Streptococcus agalactiae NOT DETECTED NOT DETECTED Final   Streptococcus pneumoniae NOT DETECTED NOT DETECTED Final   Streptococcus pyogenes NOT DETECTED NOT DETECTED Final   A.calcoaceticus-baumannii NOT DETECTED NOT DETECTED Final   Bacteroides fragilis NOT DETECTED NOT DETECTED Final   Enterobacterales NOT DETECTED NOT DETECTED Final   Enterobacter cloacae complex NOT DETECTED NOT DETECTED Final   Escherichia coli NOT DETECTED NOT DETECTED Final   Klebsiella aerogenes NOT DETECTED NOT DETECTED Final   Klebsiella oxytoca NOT DETECTED NOT DETECTED Final   Klebsiella pneumoniae NOT DETECTED NOT DETECTED Final   Proteus species NOT DETECTED NOT DETECTED Final   Salmonella species NOT DETECTED NOT DETECTED Final   Serratia marcescens NOT DETECTED NOT DETECTED Final   Haemophilus influenzae NOT DETECTED NOT DETECTED Final   Neisseria meningitidis NOT DETECTED NOT DETECTED Final   Pseudomonas aeruginosa NOT DETECTED NOT DETECTED Final   Stenotrophomonas maltophilia NOT DETECTED NOT DETECTED Final   Candida albicans NOT DETECTED NOT DETECTED Final   Candida auris NOT DETECTED NOT DETECTED Final   Candida glabrata NOT DETECTED NOT DETECTED Final   Candida krusei NOT DETECTED NOT DETECTED Final   Candida parapsilosis NOT DETECTED NOT DETECTED Final   Candida tropicalis NOT DETECTED NOT DETECTED Final   Cryptococcus neoformans/gattii NOT DETECTED NOT DETECTED Final    Comment: Performed at [facility], [address]  [place], [place] [address]  Culture, Urine     Status: None   Collection Time: 02/05/21  2:00 AM   Specimen: Urine, Clean Catch  Result Value Ref Range Status   Specimen Description   Final    URINE, CLEAN CATCH Performed at [facility], [place] [address] [place], [place] [address]    Special Requests   Final    NONE Performed at [facility], [place] [address] [place], [place] [address]    Culture   Final    NO GROWTH Performed at [facility], [place] [address], [place],  [address]    Report Status 02/06/2021 FINAL  Final  Surgical pcr screen     Status: Abnormal   Collection Time: 02/07/21  8:03 AM   Specimen: Nasal Mucosa; Nasal Swab  Result Value Ref Range Status   MRSA, PCR NEGATIVE NEGATIVE Final   Staphylococcus aureus POSITIVE (A) NEGATIVE Final    Comment: (NOTE) The Xpert SA Assay (FDA approved for NASAL specimens in patients 26 years of age and older), is one component of a comprehensive surveillance program. It is not intended to diagnose infection nor to guide or monitor treatment. Performed at [facility], [place] [address], [place],  [address]           Radiology Studies: No results found.      Scheduled Meds:  amLODipine  10 mg Oral Daily   calcium citrate  200 mg of elemental calcium Oral Daily   cholecalciferol  1,000 Units Oral Daily   clotrimazole  1 Applicatorful Vaginal QHS   cycloSPORINE  1 drop Both Eyes BID   enoxaparin (LOVENOX) injection  40 mg Subcutaneous Q24H   Erenumab-aooe  70 mg Subcutaneous Q30 days   estradiol  1 Applicatorful Vaginal Once per day on Mon Wed Fri   ferrous sulfate  650 mg Oral Q breakfast   ketotifen  1 drop Both Eyes Daily   lamoTRIgine  200 mg Oral QHS   levothyroxine  75 mcg Oral Q0600   magnesium oxide  400 mg Oral Daily   mirtazapine  30 mg Oral QHS   oxybutynin  2.5 mg Oral BID   pantoprazole  40 mg Oral Daily   potassium chloride SA   40 mEq Oral Daily   rOPINIRole  1 mg Oral QHS   saccharomyces boulardii  250 mg Oral BID   tamsulosin  0.4 mg Oral Daily   vancomycin  125 mg Oral QID   zinc oxide   Topical BID   Continuous Infusions:  meropenem (MERREM) IV 1 g (02/12/21 2159)          Aline August, MD [facility] 02/13/2021, 7:45 AM

## 2021-02-13 NOTE — Care Management Important Message (Signed)
Important Message  Patient Details IM Letter given to the Patient. Name: Megan Brewer MRN: [id number] Date of Birth: [birth year]   Medicare Important Message Given:  Yes     [name] 02/13/2021, 9:24 AM

## 2021-02-14 ENCOUNTER — Telehealth: Payer: Self-pay

## 2021-02-14 MED ORDER — VANCOMYCIN HCL 125 MG PO CAPS: 125.0000 mg | ORAL_CAPSULE | Freq: Every day | ORAL | Status: DC

## 2021-02-14 MED ORDER — VANCOMYCIN HCL 125 MG PO CAPS: 125.0000 mg | ORAL_CAPSULE | Freq: Two times a day (BID) | ORAL | Status: DC

## 2021-02-14 MED ORDER — MIRTAZAPINE 30 MG PO TABS: 30.0000 mg | ORAL_TABLET | Freq: Every day | ORAL | Status: DC

## 2021-02-14 MED ORDER — VANCOMYCIN HCL 125 MG PO CAPS: 125.0000 mg | ORAL_CAPSULE | Freq: Four times a day (QID) | ORAL | 0 refills | Status: AC

## 2021-02-14 MED ORDER — VITAMIN B-12 1000 MCG PO TABS: 1000.0000 ug | ORAL_TABLET | Freq: Every day | ORAL | 0 refills | Status: DC

## 2021-02-14 MED ORDER — ZINC OXIDE 10 % EX CREA: 1.0000 "application " | TOPICAL_CREAM | Freq: Two times a day (BID) | CUTANEOUS | Status: AC | PRN

## 2021-02-14 MED ORDER — SUCRALFATE 1 GM/10ML PO SUSP: 2.0000 g | Freq: Three times a day (TID) | ORAL | Status: DC | PRN

## 2021-02-14 MED ORDER — VANCOMYCIN HCL 125 MG PO CAPS: 125.0000 mg | ORAL_CAPSULE | Freq: Every day | ORAL | 0 refills | Status: AC

## 2021-02-14 MED ORDER — VANCOMYCIN HCL 125 MG PO CAPS: 125.0000 mg | ORAL_CAPSULE | Freq: Two times a day (BID) | ORAL | 0 refills | Status: AC

## 2021-02-14 MED ORDER — VANCOMYCIN HCL 125 MG PO CAPS: 125.0000 mg | ORAL_CAPSULE | ORAL | 0 refills | Status: AC

## 2021-02-14 MED ORDER — LAMOTRIGINE 200 MG PO TABS: 200.0000 mg | ORAL_TABLET | Freq: Every day | ORAL | Status: DC

## 2021-02-14 MED ORDER — VANCOMYCIN HCL 125 MG PO CAPS: 125.0000 mg | ORAL_CAPSULE | ORAL | Status: DC

## 2021-02-14 MED ORDER — HYDROXYZINE PAMOATE 25 MG PO CAPS: 25.0000 mg | ORAL_CAPSULE | Freq: Two times a day (BID) | ORAL | Status: DC | PRN

## 2021-02-14 MED ORDER — VANCOMYCIN HCL 125 MG PO CAPS
125.0000 mg | ORAL_CAPSULE | Freq: Four times a day (QID) | ORAL | Status: DC
  Administered 2021-02-14: 125 mg via ORAL
  Filled 2021-02-14 (×2): qty 1

## 2021-02-14 NOTE — Telephone Encounter (Signed)
Transition Care Management Follow-Up Telephone Call   Date discharged and where:  How have you been since you were released from the hospital? Not feeling good only been home 3 hours  Any patient concerns? No  Items Reviewed:   Meds: Yes  Allergies:Yes  Dietary Changes Reviewed:  Functional Questionnaire:  Independent-I  Dependent-D  ADLs:   Dressing- No problems    Eating-No problems   Maintaining continence-No problems   Transferring-No problems   Transportation-No problems   Meal Prep-No problems   Managing Meds- No problems  Confirmed importance and Date/Time of follow-up visits scheduled:   Confirmed with patient if condition worsens to call PCP or go to the Emergency Dept. Patient was given office number and encouraged to call back with questions or concerns:

## 2021-02-14 NOTE — Discharge Summary (Signed)
Physician Discharge Summary  Megan Brewer PJA:[id number] DOB: May 18, 1946 DOA: 02/04/2021  PCP: [name], NP  Admit date: 02/04/2021 Discharge date: 02/14/2021  Admitted From: Home Disposition: Home  Recommendations for Outpatient Follow-up:  Follow up with PCP in 1 week with repeat CBC/BMP Outpatient follow-up with ID Follow up in ED if symptoms worsen or new appear   Home Health: No Equipment/Devices: Megan Brewer  Discharge Condition: Stable CODE STATUS: Full Diet recommendation: Heart healthy  Brief/Interim Summary: 75 year old female with bipolar disorder, lung cancer, C. difficile colitis, hypogammaglobulinemia, hypothyroidism, gastric bypass, recent hospitalization for gram-negative bacteremia and C. difficile infection treated with oral vancomycin and 2 weeks of IV cefepime which was completed on 01/29/2021 presented as a direct admission from the ID clinic with rigors, malaise.  She was admitted for persistent gram-negative bacteremia and started on IV meropenem and oral vancomycin was continued for C. difficile.  ID was consulted.  Discharge Diagnoses:   Concern for persistent gram-negative bacteremia -Patient completed 2 weeks of IV cefepime on 01/29/2021 for Cram negative/Gram variable bacteremia: Sensitivities and identification of organism on 01/14/2021 is still pending from [facility] -Blood cultures from 02/04/2021 growing gram-negative rods: ID and sensitivities pending from [facility].  Status post port removal on 02/07/2021 -Repeat blood cultures from 02/04/2021: No growth so far.  TTE negative for vegetations -ID recommended to continue meropenem till 02/14/2021 which will complete 7 days post removal of port -Currently afebrile and hemodynamically stable. -She will be discharged home today.  Outpatient follow-up with ID.   C. difficile colitis, second episode -Diarrhea improving.  Outpatient follow-up with ID.  Continue prolonged oral vancomycin taper as per ID as  below: 125 mg orally 4 times daily for 14 days, then 125 mg orally 2 times daily for 7 days, then 125 mg orally once daily for 7 days, then 125 mg orally every 2 days for 2 weeks  B12 deficiency -Received IM B12 on 02/08/2021.  Will need oral supplementation on discharge  History of hypogammaglobinemia -receives Gammagard every 3 weeks, last dose 6/17   Migraine headaches -Gets Aimovig every monthly -Headache improving.  Outpatient follow-up.   Hypothyroidism -Continue Synthroid  Overactive bladder -Continue oxybutynin  Urinary retention -continue Flomax  History of gastric bypass  Essential hypertension -Continue home regimen.  Discharge Instructions  Discharge Instructions     Ambulatory referral to Infectious Disease   Complete by: As directed    Hospital followup for C diff colitis   Diet - low sodium heart healthy   Complete by: As directed    Increase activity slowly   Complete by: As directed    No wound care   Complete by: As directed       Allergies as of 02/14/2021       Reactions   Imitrex [sumatriptan] Nausea And Vomiting   Ringing in the ears, migraines are worse   Tetracycline Nausea Only   Causes ringing in ears, dizziness, migraine, nausea   Corticosteroids Other (See Comments)   12/02/2016 interview by HAL: Oral dosing causes migraines/nausea/tinnitus. Prev tolerated IV and intranasal admin w/o difficulty.   Cefixime Other (See Comments)   Headache   Ciprofloxacin Other (See Comments)   Headache, dizziness, ringing in the ears   Doxycycline Other (See Comments)   unknown   Gabapentin    Throat swells/closes   Isometheptene-dichloral-apap Other (See Comments)   unknown   Ketorolac    12/02/2016 interview by PFX: Oral dosing prednisone/corticosteroids causes migraines/nausea/tinnitus. Prev tolerated IV and intranasal admin w/o difficulty.  Prednisone Other (See Comments)   Migraine, dizzy, ringing in ears-can take it IV 12/02/2016  interview by [name]: Oral prednisone dosing causes migraines/nausea/tinnitus. Prev tolerated IV and intranasal admin w/o difficulty.   Promethazine Other (See Comments)   causes severe abdominal pain when taken IV; however she can take PO or IM   Stadol [butorphanol]    Cerebral pain   Erythromycin Base Diarrhea, Itching   Severe diarrhea   Propoxyphene Nausea Only        Medication List     TAKE these medications    acetaminophen 500 MG tablet Commonly known as: TYLENOL Take 1-2 tablets (500-1,000 mg total) by mouth every 8 (eight) hours as needed (Max dose 3000 mg in 24 hours). What changed: reasons to take this   Aimovig (140 MG Dose) 70 MG/ML Soaj Generic drug: Erenumab-aooe Inject 70 mg into the skin every 30 (thirty) days.   amLODipine 10 MG tablet Commonly known as: NORVASC Take 1 tablet (10 mg total) by mouth daily.   Azelastine-Fluticasone 137-50 MCG/ACT Susp ONE SPRAY EACH NOSTRIL TWICE A DAY AS NEEDED What changed:  how much to take how to take this when to take this reasons to take this additional instructions   baclofen 10 MG tablet Commonly known as: LIORESAL Take 10 mg by mouth daily as needed for muscle spasms.   Calcium Citrate 200 MG Tabs Take 200 mg of elemental calcium by mouth daily.   cholecalciferol 25 MCG (1000 UNIT) tablet Commonly known as: VITAMIN D3 Take 1,000 Units by mouth daily.   cycloSPORINE 0.05 % ophthalmic emulsion Commonly known as: RESTASIS Place 1 drop into both eyes 2 (two) times daily.   dexlansoprazole 60 MG capsule Commonly known as: Dexilant Take 1 capsule (60 mg total) by mouth daily. Crack open the capsule and pour into spoon prior to ingestion. PLEASE SCHEDULE AN OFFICE VISIT FOR FURTHER REFILLS. Thank you What changed: additional instructions   estradiol 0.1 MG/GM vaginal cream Commonly known as: ESTRACE Place 1 Applicatorful vaginally 3 (three) times a week.   ferrous sulfate 325 (65 FE) MG tablet Take 650  mg by mouth daily with breakfast.   hydrOXYzine 25 MG capsule Commonly known as: VISTARIL Take 1 capsule (25 mg total) by mouth 2 (two) times daily as needed for anxiety.   ibuprofen 200 MG tablet Commonly known as: ADVIL Take 400 mg by mouth every 6 (six) hours as needed for mild pain.   immune globulin (human) 10 g injection Commonly known as: GAMMAGARD S/D LESS IGA Inject 2 g/kg into the vein every 21 ( twenty-one) days.   lamoTRIgine 200 MG tablet Commonly known as: LAMICTAL Take 1 tablet (200 mg total) by mouth at bedtime.   Lastacaft 0.25 % Soln Generic drug: Alcaftadine Place 1 drop into both eyes daily.   levothyroxine 75 MCG tablet Commonly known as: SYNTHROID Take one tablet by mouth once daily 30 minutes before breakfast on empty stomach. What changed:  how much to take how to take this when to take this additional instructions   magnesium oxide 400 MG tablet Commonly known as: MAG-OX Take 400 mg by mouth daily.   mirtazapine 30 MG tablet Commonly known as: Remeron Take 1 tablet (30 mg total) by mouth at bedtime.   oxybutynin 5 MG/5ML syrup Commonly known as: DITROPAN Take 2.5 mLs (2.5 mg total) by mouth 2 (two) times daily.   potassium chloride SA 20 MEQ tablet Commonly known as: KLOR-CON TAKE TWO TABLETS BY MOUTH DAILY (MAY DISSOLVE  IN WATER) What changed:  how much to take how to take this when to take this additional instructions   rOPINIRole 1 MG tablet Commonly known as: REQUIP TAKE 1 TABLET BY MOUTH AT BEDTIME   saccharomyces boulardii 250 MG capsule Commonly known as: Florastor Take 1 capsule (250 mg total) by mouth 2 (two) times daily.   sucralfate 1 GM/10ML suspension Commonly known as: CARAFATE Take 20 mLs (2 g total) by mouth 3 (three) times daily as needed (gerd).   tamsulosin 0.4 MG Caps capsule Commonly known as: FLOMAX Take 1 capsule (0.4 mg total) by mouth daily.   tiZANidine 4 MG tablet Commonly known as: Zanaflex Take  1 tablet (4 mg total) by mouth every 6 (six) hours as needed for muscle spasms.   torsemide 20 MG tablet Commonly known as: DEMADEX TAKE 1 TABLET BY MOUTH DAILY   triamcinolone cream 0.1 % Commonly known as: KENALOG Apply 1 application topically 2 (two) times daily as needed (rash).   UNABLE TO FIND Take 1 tablet by mouth daily. Med Name: Megan Brewer 1 by mouth daily for UTI prevention   vancomycin 125 MG capsule Commonly known as: VANCOCIN Take 1 capsule (125 mg total) by mouth 4 (four) times daily for 2 days. Start taking on: February 15, 2021 What changed: additional instructions   vancomycin 125 MG capsule Commonly known as: VANCOCIN Take 1 capsule (125 mg total) by mouth in the morning and at bedtime for 7 days. Start taking on: February 18, 2021 What changed: You were already taking a medication with the same name, and this prescription was added. Make sure you understand how and when to take each.   vancomycin 125 MG capsule Commonly known as: VANCOCIN Take 1 capsule (125 mg total) by mouth daily for 7 days. Start taking on: February 26, 2021 What changed: You were already taking a medication with the same name, and this prescription was added. Make sure you understand how and when to take each.   vancomycin 125 MG capsule Commonly known as: VANCOCIN Take 1 capsule (125 mg total) by mouth every other day for 14 days. Start taking on: March 06, 2021 What changed: You were already taking a medication with the same name, and this prescription was added. Make sure you understand how and when to take each.   vitamin B-12 1000 MCG tablet Commonly known as: CYANOCOBALAMIN Take 1 tablet (1,000 mcg total) by mouth daily.   ZINC OXIDE (TOPICAL) 10 % Crea Apply 1 application topically 2 (two) times daily as needed (rash).          Follow-up Information     [name], NP. Schedule an appointment as soon as possible for a visit in 1 week(s).   Specialty: Geriatric Medicine Why:  with repeat cbc/bmp Contact information: [address]. [place] [address] [phone]                Allergies  Allergen Reactions   Imitrex [Sumatriptan] Nausea And Vomiting    Ringing in the ears, migraines are worse   Tetracycline Nausea Only    Causes ringing in ears, dizziness, migraine, nausea   Corticosteroids Other (See Comments)    12/02/2016 interview by Melissa Montane: Oral dosing causes migraines/nausea/tinnitus. Prev tolerated IV and intranasal admin w/o difficulty.   Cefixime Other (See Comments)    Headache    Ciprofloxacin Other (See Comments)    Headache, dizziness, ringing in the ears   Doxycycline Other (See Comments)    unknown  Gabapentin     Throat swells/closes   Isometheptene-Dichloral-Apap Other (See Comments)    unknown   Ketorolac     12/02/2016 interview by ZSW: Oral dosing prednisone/corticosteroids causes migraines/nausea/tinnitus. Prev tolerated IV and intranasal admin w/o difficulty.   Prednisone Other (See Comments)    Migraine, dizzy, ringing in ears-can take it IV 12/02/2016 interview by [name]: Oral prednisone dosing causes migraines/nausea/tinnitus. Prev tolerated IV and intranasal admin w/o difficulty.   Promethazine Other (See Comments)    causes severe abdominal pain when taken IV; however she can take PO or IM   Stadol [Butorphanol]     Cerebral pain   Erythromycin Base Diarrhea and Itching    Severe diarrhea   Propoxyphene Nausea Only    Consultations: ID   Procedures/Studies: US RENAL  Result Date: 01/17/2021 CLINICAL DATA:  Urinary retention EXAM: RENAL / URINARY TRACT ULTRASOUND COMPLETE COMPARISON:  CT abdomen and pelvis Jan 14, 2021 FINDINGS: Right Kidney: Renal measurements: 9.1 x 3.9 x 4.2 cm = volume: 77.6 mL. Echogenicity and renal cortical thickness are within normal limits. No mass, perinephric fluid, or hydronephrosis visualized. No sonographically demonstrable calculus or ureterectasis. Left Kidney: Renal  measurements: 9.6 x 4.7 x 5.8 cm = volume: 135.8 mL. Echogenicity and renal cortical thickness are within normal limits. No mass, perinephric fluid, or hydronephrosis visualized. No sonographically demonstrable calculus or ureterectasis. Bladder: Appears normal for degree of bladder distention. Note that there is a Foley catheter in the bladder. There is still significant volume of urine in the bladder. Flow from each distal ureter seen within the bladder. Other: Mild ascites noted. IMPRESSION: Normal appearing kidneys bilaterally. There is moderate urine in the bladder with Foley catheter in place. No bladder wall thickening, focal urinary bladder lesion appreciable by ultrasound. Flow from each distal ureter seen within the bladder. Mild ascites noted. Electronically Signed   By: Lowella Grip III M.D.   On: 01/17/2021 20:20   DG CHEST PORT 1 VIEW  Result Date: 02/04/2021 CLINICAL DATA:  Weakness EXAM: PORTABLE CHEST 1 VIEW COMPARISON:  01/14/2021 FINDINGS: Cardiac shadow is stable. Spinal stimulators are noted. Left chest wall port is again seen and stable. Postsurgical changes are noted in the right lung and right ribcage. Lungs are clear. No bony abnormality is noted. IMPRESSION: No acute abnormality noted. Electronically Signed   By: Inez Catalina M.D.   On: 02/04/2021 18:06   ECHOCARDIOGRAM COMPLETE  Result Date: 02/05/2021    ECHOCARDIOGRAM REPORT   Patient Name:   [facility] Date of Exam: 02/05/2021 Medical Rec #:  [id number]          Height:       62.0 in Accession #:    [id number]         Weight:       124.0 lb Date of Birth:  [birth year]         BSA:          1.560 m Patient Age:    75 years           BP:           124/66 mmHg Patient Gender: F                  HR:           67 bpm. Exam Location:  Inpatient Procedure: 2D Echo, Color Doppler and Cardiac Doppler Indications:    Bacteremia R78.81  History:        Patient has prior history  of Echocardiogram examinations, most                  recent 01/15/2021. Risk Factors:Sleep Apnea. Gram negative                 bacteria and c diff infection. Chronic kidney disease.                 Hypothyroid. GERD. Lung cancer.  Sonographer:    Darlina Sicilian RDCS Referring Phys: [id number] [facility]  Sonographer Comments: Technically challenging study due to limited acoustic windows, suboptimal parasternal window and suboptimal apical window. IMPRESSIONS  1. Left ventricular ejection fraction, by estimation, is 60 to 65%. The left ventricle has normal function. The left ventricle has no regional wall motion abnormalities. Left ventricular diastolic parameters are consistent with Grade I diastolic dysfunction (impaired relaxation).  2. Right ventricular systolic function is normal. The right ventricular size is normal. There is normal pulmonary artery systolic pressure.  3. The mitral valve is normal in structure. No evidence of mitral valve regurgitation. No evidence of mitral stenosis.  4. The aortic valve was not well visualized. Aortic valve regurgitation is trivial. No aortic stenosis is present.  5. The inferior vena cava is normal in size with greater than 50% respiratory variability, suggesting right atrial pressure of 3 mmHg. FINDINGS  Left Ventricle: Left ventricular ejection fraction, by estimation, is 60 to 65%. The left ventricle has normal function. The left ventricle has no regional wall motion abnormalities. The left ventricular internal cavity size was normal in size. There is  no left ventricular hypertrophy. Left ventricular diastolic parameters are consistent with Grade I diastolic dysfunction (impaired relaxation). Normal left ventricular filling pressure. Right Ventricle: The right ventricular size is normal. No increase in right ventricular wall thickness. Right ventricular systolic function is normal. There is normal pulmonary artery systolic pressure. The tricuspid regurgitant velocity is 2.84 m/s, and  with an assumed right atrial  pressure of 3 mmHg, the estimated right ventricular systolic pressure is 60.7 mmHg. Left Atrium: Left atrial size was normal in size. Right Atrium: Right atrial size was normal in size. Pericardium: There is no evidence of pericardial effusion. Mitral Valve: The mitral valve is normal in structure. No evidence of mitral valve regurgitation. No evidence of mitral valve stenosis. Tricuspid Valve: The tricuspid valve is normal in structure. Tricuspid valve regurgitation is mild . No evidence of tricuspid stenosis. Aortic Valve: The aortic valve was not well visualized. Aortic valve regurgitation is trivial. No aortic stenosis is present. Pulmonic Valve: The pulmonic valve was normal in structure. Pulmonic valve regurgitation is not visualized. No evidence of pulmonic stenosis. Aorta: The aortic root is normal in size and structure. Venous: The inferior vena cava is normal in size with greater than 50% respiratory variability, suggesting right atrial pressure of 3 mmHg. IAS/Shunts: No atrial level shunt detected by color flow Doppler.  LEFT VENTRICLE PLAX 2D LVIDd:         3.90 cm  Diastology LVIDs:         2.40 cm  LV e' medial:    6.27 cm/s LV PW:         0.90 cm  LV E/e' medial:  9.8 LV IVS:        0.70 cm  LV e' lateral:   6.11 cm/s LVOT diam:     1.50 cm  LV E/e' lateral: 10.0 LV SV:         29 LV SV Index:   19  LVOT Area:     1.77 cm  RIGHT VENTRICLE RV S prime:     11.80 cm/s TAPSE (M-mode): 2.0 cm LEFT ATRIUM             Index LA diam:        3.20 cm 2.05 cm/m LA Vol (A2C):   34.1 ml 21.86 ml/m LA Vol (A4C):   34.2 ml 21.92 ml/m LA Biplane Vol: 35.3 ml 22.63 ml/m  AORTIC VALVE LVOT Vmax:   83.90 cm/s LVOT Vmean:  62.300 cm/s LVOT VTI:    0.166 m  AORTA Ao Root diam: 2.40 cm MITRAL VALVE               TRICUSPID VALVE MV Area (PHT): 3.54 cm    TR Peak grad:   32.3 mmHg MV Decel Time: 214 msec    TR Vmax:        284.00 cm/s MV E velocity: 61.40 cm/s MV A velocity: 90.20 cm/s  SHUNTS MV E/A ratio:  0.68         Systemic VTI:  0.17 m                            Systemic Diam: 1.50 cm Skeet Latch MD Electronically signed by Skeet Latch MD Signature Date/Time: 02/05/2021/5:21:40 PM    Final    [place] CHEST SOFT TISSUE  Result Date: 02/05/2021 CLINICAL DATA:  History of left-sided chest port, possible underlying infection EXAM: ULTRASOUND OF CHEST SOFT TISSUES TECHNIQUE: Ultrasound examination of the chest wall soft tissues was performed in the area of clinical concern. COMPARISON:  Chest x-ray from the previous day. FINDINGS: Sonographic evaluation in the left chest wall adjacent to the known chest wall port shows the port reservoir and catheter to be within normal limits. No surrounding hypoechoic collection is identified to suggest port abscess. IMPRESSION: No evidence of port abscess. Electronically Signed   By: Inez Catalina M.D.   On: 02/05/2021 15:50   VAS [place] LOWER EXTREMITY VENOUS (DVT)  Result Date: 01/15/2021  Lower Venous DVT Study Patient Name:  FERNANDO TORRY  Date of Exam:   01/15/2021 Medical Rec #: [id number]           Accession #:    [id number] Date of Birth: Dec 08, 1945          Patient Gender: F Patient Age:   74Y Exam Location:  [facility] Procedure:      VAS [place] LOWER EXTREMITY VENOUS (DVT) Referring Phys: [place] --------------------------------------------------------------------------------  Indications: Edema.  Risk Factors: Megan Brewer identified. Comparison Study: 01/11/2021 - Negative for DVT on the right. Performing Technologist: Oliver Hum RVT  Examination Guidelines: A complete evaluation includes B-mode imaging, spectral Doppler, color Doppler, and power Doppler as needed of all accessible portions of each vessel. Bilateral testing is considered an integral part of a complete examination. Limited examinations for reoccurring indications may be performed as noted. The reflux portion of the exam is performed with the patient in reverse Trendelenburg.   +-----+---------------+---------+-----------+----------+--------------+ RIGHTCompressibilityPhasicitySpontaneityPropertiesThrombus Aging +-----+---------------+---------+-----------+----------+--------------+ CFV  Full           Yes      Yes                                 +-----+---------------+---------+-----------+----------+--------------+   +---------+---------------+---------+-----------+----------+--------------+ LEFT     CompressibilityPhasicitySpontaneityPropertiesThrombus Aging +---------+---------------+---------+-----------+----------+--------------+ CFV      Full  Yes      Yes                                 +---------+---------------+---------+-----------+----------+--------------+ SFJ      Full                                                        +---------+---------------+---------+-----------+----------+--------------+ FV Prox  Full                                                        +---------+---------------+---------+-----------+----------+--------------+ FV Mid   Full                                                        +---------+---------------+---------+-----------+----------+--------------+ FV DistalFull                                                        +---------+---------------+---------+-----------+----------+--------------+ PFV      Full                                                        +---------+---------------+---------+-----------+----------+--------------+ POP      Full           Yes      Yes                                 +---------+---------------+---------+-----------+----------+--------------+ PTV      Full                                                        +---------+---------------+---------+-----------+----------+--------------+ PERO     Full                                                         +---------+---------------+---------+-----------+----------+--------------+     Summary: RIGHT: - No evidence of common femoral vein obstruction.  LEFT: - There is no evidence of deep vein thrombosis in the lower extremity.  - No cystic structure found in the popliteal fossa.  *See table(s) above for measurements and observations. Electronically signed by Harold Barban MD on 01/15/2021 at 10:18:34 PM.    Final       Subjective: Patient seen and  examined at bedside.  Feels better with improving headache.  Still has on and off diarrhea.  Feels okay to go home today.  Denies any overnight fever or vomiting.  Tolerating diet.  Discharge Exam: Vitals:   02/13/21 2002 02/14/21 0403  BP: 128/61 134/67  Pulse: 93 70  Resp: 18 18  Temp: 98 F (36.7 C) (!) 97.5 F (36.4 C)  SpO2: 99% 99%    General: Pt is alert, awake, not in acute distress.  Chronically ill looking elderly female sitting on bed.  On room air. Cardiovascular: rate controlled, S1/S2 + Respiratory: bilateral decreased breath sounds at bases Abdominal: Soft, NT, ND, bowel sounds + Extremities: no edema, no cyanosis    The results of significant diagnostics from this hospitalization (including imaging, microbiology, ancillary and laboratory) are listed below for reference.     Microbiology: Recent Results (from the past 240 hour(s))  Culture, blood (routine x 2)     Status: Megan Brewer   Collection Time: 02/04/21  5:12 PM   Specimen: BLOOD  Result Value Ref Range Status   Specimen Description   Final    BLOOD RIGHT ARM Performed at [facility] [address] [place], [place] [address]    Special Requests   Final    BOTTLES DRAWN AEROBIC AND ANAEROBIC Blood Culture adequate volume Performed at [facility] [address], [place], [place] [address]    Culture   Final    NO GROWTH 5 DAYS Performed at [facility], [place] [address], [place], [place] [address]    Report Status  02/09/2021 FINAL  Final  Culture, blood (routine x 2)     Status: Megan Brewer   Collection Time: 02/04/21  5:12 PM   Specimen: BLOOD  Result Value Ref Range Status   Specimen Description   Final    BLOOD LEFT ANTECUBITAL Performed at [facility] [address] [place], [place] [address]    Special Requests   Final    BOTTLES DRAWN AEROBIC ONLY Blood Culture adequate volume Performed at [facility] [address] [place], [place] [address]    Culture   Final    NO GROWTH 5 DAYS Performed at [facility], [place] [address], [place], [place] [address]    Report Status 02/09/2021 FINAL  Final  Resp Panel by RT-PCR (Flu A&B, Covid) Nasopharyngeal Swab     Status: Megan Brewer   Collection Time: 02/04/21  5:56 PM   Specimen: Nasopharyngeal Swab; Nasopharyngeal(NP) swabs in vial transport medium  Result Value Ref Range Status   SARS Coronavirus 2 by RT PCR NEGATIVE NEGATIVE Final    Comment: (NOTE) SARS-CoV-2 target nucleic acids are NOT DETECTED.  The SARS-CoV-2 RNA is generally detectable in upper respiratory specimens during the acute phase of infection. The lowest concentration of SARS-CoV-2 viral copies this assay can detect is 138 copies/mL. A negative result does not preclude SARS-Cov-2 infection and should not be used as the sole basis for treatment or other patient management decisions. A negative result may occur with  improper specimen collection/handling, submission of specimen other than nasopharyngeal swab, presence of viral mutation(s) within the areas targeted by this assay, and inadequate number of viral copies(<138 copies/mL). A negative result must be combined with clinical observations, patient history, and epidemiological information. The expected result is Negative.  Fact Sheet for Patients:  EntrepreneurPulse.com.au  Fact Sheet for Healthcare Providers:  IncredibleEmployment.be  This test  is no t yet approved  or cleared by the [place] and  has been authorized for detection and/or diagnosis of SARS-CoV-2 by FDA under an Emergency Use Authorization (EUA). This EUA will remain  in effect (meaning this test can be used) for the duration of the COVID-19 declaration under Section 564(b)(1) of the Act, 21 U.S.C.section 360bbb-3(b)(1), unless the authorization is terminated  or revoked sooner.       Influenza A by PCR NEGATIVE NEGATIVE Final   Influenza B by PCR NEGATIVE NEGATIVE Final    Comment: (NOTE) The Xpert Xpress SARS-CoV-2/FLU/RSV plus assay is intended as an aid in the diagnosis of influenza from Nasopharyngeal swab specimens and should not be used as a sole basis for treatment. Nasal washings and aspirates are unacceptable for Xpert Xpress SARS-CoV-2/FLU/RSV testing.  Fact Sheet for Patients: EntrepreneurPulse.com.au  Fact Sheet for Healthcare Providers: IncredibleEmployment.be  This test is not yet approved or cleared by the [place] FDA and has been authorized for detection and/or diagnosis of SARS-CoV-2 by FDA under an Emergency Use Authorization (EUA). This EUA will remain in effect (meaning this test can be used) for the duration of the COVID-19 declaration under Section 564(b)(1) of the Act, 21 U.S.C. section 360bbb-3(b)(1), unless the authorization is terminated or revoked.  Performed at [facility], [place] [address], [place], [place] [address]   Culture, blood (single)     Status: Megan Brewer (Preliminary result)   Collection Time: 02/04/21  6:37 PM   Specimen: BLOOD  Result Value Ref Range Status   Specimen Description   Final    BLOOD PORTA CATH LEFT CHEST Performed at [place] [address] [place], [place] [address]    Special Requests   Final    BOTTLES DRAWN AEROBIC AND ANAEROBIC Blood Culture adequate volume Performed at [place] [address] [place], [place] [address]    Culture  Setup Time   Final    GRAM NEGATIVE RODS IN BOTH AEROBIC AND ANAEROBIC BOTTLES CRITICAL RESULT CALLED TO, READ BACK BY AND VERIFIED WITH: Melodye Ped [facility] 0814 02/05/21 A BROWNING    Culture   Final    GRAM NEGATIVE RODS SENT TO [facility] FOR IDENTIFICATION Sent to [place] for further susceptibility testing. Performed at [facility], [place] [address], [place], [place] [address]    Report Status PENDING  Incomplete  Blood Culture ID Panel (Reflexed)     Status: Megan Brewer   Collection Time: 02/04/21  6:37 PM  Result Value Ref Range Status   Enterococcus faecalis NOT DETECTED NOT DETECTED Final   Enterococcus Faecium NOT DETECTED NOT DETECTED Final   Listeria monocytogenes NOT DETECTED NOT DETECTED Final   Staphylococcus species NOT DETECTED NOT DETECTED Final   Staphylococcus aureus (BCID) NOT DETECTED NOT DETECTED Final   Staphylococcus epidermidis NOT DETECTED NOT DETECTED Final   Staphylococcus lugdunensis NOT DETECTED NOT DETECTED Final   Streptococcus species NOT DETECTED NOT DETECTED Final   Streptococcus agalactiae NOT DETECTED NOT DETECTED Final   Streptococcus pneumoniae NOT DETECTED NOT DETECTED Final   Streptococcus pyogenes NOT DETECTED NOT DETECTED Final   A.calcoaceticus-baumannii NOT DETECTED NOT DETECTED Final   Bacteroides fragilis NOT DETECTED NOT DETECTED Final   Enterobacterales NOT DETECTED NOT DETECTED Final   Enterobacter cloacae complex NOT DETECTED NOT DETECTED Final   Escherichia coli NOT DETECTED NOT DETECTED Final   Klebsiella aerogenes NOT DETECTED NOT DETECTED Final   Klebsiella oxytoca NOT DETECTED NOT DETECTED Final   Klebsiella pneumoniae NOT DETECTED  NOT DETECTED Final   Proteus species NOT DETECTED NOT DETECTED Final   Salmonella species NOT DETECTED NOT DETECTED Final   Serratia marcescens NOT DETECTED NOT DETECTED Final   Haemophilus influenzae NOT DETECTED NOT DETECTED Final   Neisseria  meningitidis NOT DETECTED NOT DETECTED Final   Pseudomonas aeruginosa NOT DETECTED NOT DETECTED Final   Stenotrophomonas maltophilia NOT DETECTED NOT DETECTED Final   Candida albicans NOT DETECTED NOT DETECTED Final   Candida auris NOT DETECTED NOT DETECTED Final   Candida glabrata NOT DETECTED NOT DETECTED Final   Candida krusei NOT DETECTED NOT DETECTED Final   Candida parapsilosis NOT DETECTED NOT DETECTED Final   Candida tropicalis NOT DETECTED NOT DETECTED Final   Cryptococcus neoformans/gattii NOT DETECTED NOT DETECTED Final    Comment: Performed at [facility], [place] [address] [place], [place] [address]  Bacterial organism reflex     Status: Megan Brewer   Collection Time: 02/04/21  6:37 PM  Result Value Ref Range Status   Bacterial result 1 Comment  Final    Comment: (NOTE) Specimen has been received and testing has been initiated. Performed At: [facility] [place], [place] [id number] Rush Farmer MD FU:[id number]   Culture, Urine     Status: Megan Brewer   Collection Time: 02/05/21  2:00 AM   Specimen: Urine, Clean Catch  Result Value Ref Range Status   Specimen Description   Final    URINE, CLEAN CATCH Performed at [facility], [place] [address] [place], [place] [address]    Special Requests   Final    Megan Brewer Performed at [facility], [place] [address] [place], [place] [address]    Culture   Final    NO GROWTH Performed at [facility], [place] [address], [place],  [place] [address]    Report Status 02/06/2021 FINAL  Final  Surgical pcr screen     Status: Abnormal   Collection Time: 02/07/21  8:03 AM   Specimen: Nasal Mucosa; Nasal Swab  Result Value Ref Range Status   MRSA, PCR NEGATIVE NEGATIVE Final   Staphylococcus aureus POSITIVE (A) NEGATIVE Final    Comment: (NOTE) The Xpert SA Assay (FDA approved for NASAL specimens in patients 38 years of age and older), is one component of a  comprehensive surveillance program. It is not intended to diagnose infection nor to guide or monitor treatment. Performed at [facility], [place] [address], [place], [place] [address]      Labs: BNP (last 3 results) Recent Labs    01/11/21 1214 01/17/21 0628  BNP 180.1* 16.0   Basic Metabolic Panel: Recent Labs  Lab 02/08/21 0625 02/10/21 0459 02/11/21 0444 02/12/21 0515  NA 139 140 138 136  K 4.7 4.4 4.5 4.4  CL 105 109 109 105  CO2 30 26 24 24   GLUCOSE 91 88 84 84  BUN 15 14 14 17   CREATININE 0.89 0.68 0.75 0.95  CALCIUM 9.1 8.8* 8.6* 8.6*   Liver Function Tests: No results for input(s): AST, ALT, ALKPHOS, BILITOT, PROT, ALBUMIN in the last 168 hours. No results for input(s): LIPASE, AMYLASE in the last 168 hours. No results for input(s): AMMONIA in the last 168 hours. CBC: Recent Labs  Lab 02/08/21 0625 02/10/21 0459 02/11/21 0444 02/12/21 0515  WBC 4.0 3.2* 4.0 3.4*  HGB 11.9* 11.3* 10.9* 10.9*  HCT 37.1 35.2* 34.3* 34.3*  MCV 92.5 92.6 93.0 93.5  PLT 147* 147* 143* 146*  Cardiac Enzymes: No results for input(s): CKTOTAL, CKMB, CKMBINDEX, TROPONINI in the last 168 hours. BNP: Invalid input(s): POCBNP CBG: No results for input(s): GLUCAP in the last 168 hours. D-Dimer No results for input(s): DDIMER in the last 72 hours. Hgb A1c No results for input(s): HGBA1C in the last 72 hours. Lipid Profile No results for input(s): CHOL, HDL, LDLCALC, TRIG, CHOLHDL, LDLDIRECT in the last 72 hours. Thyroid function studies No results for input(s): TSH, T4TOTAL, T3FREE, THYROIDAB in the last 72 hours.  Invalid input(s): FREET3 Anemia work up No results for input(s): VITAMINB12, FOLATE, FERRITIN, TIBC, IRON, RETICCTPCT in the last 72 hours. Urinalysis    Component Value Date/Time   COLORURINE YELLOW 02/05/2021 0200   APPEARANCEUR CLEAR 02/05/2021 0200   LABSPEC 1.014 02/05/2021 0200   PHURINE 7.0 02/05/2021 0200   GLUCOSEU NEGATIVE  02/05/2021 0200   HGBUR SMALL (A) 02/05/2021 0200   BILIRUBINUR NEGATIVE 02/05/2021 0200   BILIRUBINUR N/A 03/25/2020 1533   KETONESUR NEGATIVE 02/05/2021 0200   PROTEINUR NEGATIVE 02/05/2021 0200   UROBILINOGEN negative (A) 03/25/2020 1533   NITRITE NEGATIVE 02/05/2021 0200   LEUKOCYTESUR TRACE (A) 02/05/2021 0200   Sepsis Labs Invalid input(s): PROCALCITONIN,  WBC,  LACTICIDVEN Microbiology Recent Results (from the past 240 hour(s))  Culture, blood (routine x 2)     Status: Megan Brewer   Collection Time: 02/04/21  5:12 PM   Specimen: BLOOD  Result Value Ref Range Status   Specimen Description   Final    BLOOD RIGHT ARM Performed at [facility], [place] [address], [place], [place] [address]    Special Requests   Final    BOTTLES DRAWN AEROBIC AND ANAEROBIC Blood Culture adequate volume Performed at [place] [address], [place], [place] [address]    Culture   Final    NO GROWTH 5 DAYS Performed at [facility], [place] [address] [place], [place] [address]    Report Status 02/09/2021 FINAL  Final  Culture, blood (routine x 2)     Status: Megan Brewer   Collection Time: 02/04/21  5:12 PM   Specimen: BLOOD  Result Value Ref Range Status   Specimen Description   Final    BLOOD LEFT ANTECUBITAL Performed at [place] [address] [place], [place] [address]    Special Requests   Final    BOTTLES DRAWN AEROBIC ONLY Blood Culture adequate volume Performed at [place] [address] [place], [place] [address]    Culture   Final    NO GROWTH 5 DAYS Performed at [facility], [place] [address], [place], [place] [address]    Report Status 02/09/2021 FINAL  Final  Resp Panel by RT-PCR (Flu A&B, Covid) Nasopharyngeal Swab     Status: Megan Brewer   Collection Time: 02/04/21  5:56 PM   Specimen: Nasopharyngeal Swab; Nasopharyngeal(NP) swabs in vial transport medium  Result Value Ref Range Status    SARS Coronavirus 2 by RT PCR NEGATIVE NEGATIVE Final    Comment: (NOTE) SARS-CoV-2 target nucleic acids are NOT DETECTED.  The SARS-CoV-2 RNA is generally detectable in upper respiratory specimens during the acute phase of infection. The lowest concentration of SARS-CoV-2 viral copies this assay can detect is 138 copies/mL. A negative result does not preclude SARS-Cov-2 infection and should not be used as the sole basis for treatment or other patient management decisions. A negative result may occur with  improper specimen collection/handling, submission of specimen other than  nasopharyngeal swab, presence of viral mutation(s) within the areas targeted by this assay, and inadequate number of viral copies(<138 copies/mL). A negative result must be combined with clinical observations, patient history, and epidemiological information. The expected result is Negative.  Fact Sheet for Patients:  EntrepreneurPulse.com.au  Fact Sheet for Healthcare Providers:  IncredibleEmployment.be  This test is no t yet approved or cleared by the [place] FDA and  has been authorized for detection and/or diagnosis of SARS-CoV-2 by FDA under an Emergency Use Authorization (EUA). This EUA will remain  in effect (meaning this test can be used) for the duration of the COVID-19 declaration under Section 564(b)(1) of the Act, 21 U.S.C.section 360bbb-3(b)(1), unless the authorization is terminated  or revoked sooner.       Influenza A by PCR NEGATIVE NEGATIVE Final   Influenza B by PCR NEGATIVE NEGATIVE Final    Comment: (NOTE) The Xpert Xpress SARS-CoV-2/FLU/RSV plus assay is intended as an aid in the diagnosis of influenza from Nasopharyngeal swab specimens and should not be used as a sole basis for treatment. Nasal washings and aspirates are unacceptable for Xpert Xpress SARS-CoV-2/FLU/RSV testing.  Fact Sheet for  Patients: EntrepreneurPulse.com.au  Fact Sheet for Healthcare Providers: IncredibleEmployment.be  This test is not yet approved or cleared by the [place] FDA and has been authorized for detection and/or diagnosis of SARS-CoV-2 by FDA under an Emergency Use Authorization (EUA). This EUA will remain in effect (meaning this test can be used) for the duration of the COVID-19 declaration under Section 564(b)(1) of the Act, 21 U.S.C. section 360bbb-3(b)(1), unless the authorization is terminated or revoked.  Performed at [facility], [place] [address], [place], [place] [address]   Culture, blood (single)     Status: Megan Brewer (Preliminary result)   Collection Time: 02/04/21  6:37 PM   Specimen: BLOOD  Result Value Ref Range Status   Specimen Description   Final    BLOOD PORTA CATH LEFT CHEST Performed at [place] [address], [place], [place] [address]    Special Requests   Final    BOTTLES DRAWN AEROBIC AND ANAEROBIC Blood Culture adequate volume Performed at [place] [address], [place], [place] [address]    Culture  Setup Time   Final    GRAM NEGATIVE RODS IN BOTH AEROBIC AND ANAEROBIC BOTTLES CRITICAL RESULT CALLED TO, READ BACK BY AND VERIFIED WITH: Melodye Ped [facility] 9924 02/05/21 A BROWNING    Culture   Final    GRAM NEGATIVE RODS SENT TO [facility] FOR IDENTIFICATION Sent to [place] for further susceptibility testing. Performed at [facility], [place] [address], [place], [place] [address]    Report Status PENDING  Incomplete  Blood Culture ID Panel (Reflexed)     Status: Megan Brewer   Collection Time: 02/04/21  6:37 PM  Result Value Ref Range Status   Enterococcus faecalis NOT DETECTED NOT DETECTED Final   Enterococcus Faecium NOT DETECTED NOT DETECTED Final   Listeria monocytogenes NOT DETECTED NOT DETECTED Final   Staphylococcus species NOT DETECTED NOT DETECTED Final    Staphylococcus aureus (BCID) NOT DETECTED NOT DETECTED Final   Staphylococcus epidermidis NOT DETECTED NOT DETECTED Final   Staphylococcus lugdunensis NOT DETECTED NOT DETECTED Final   Streptococcus species NOT DETECTED NOT DETECTED Final   Streptococcus agalactiae NOT DETECTED NOT DETECTED Final   Streptococcus pneumoniae NOT DETECTED NOT DETECTED Final   Streptococcus pyogenes NOT DETECTED NOT DETECTED Final   A.calcoaceticus-baumannii NOT DETECTED  NOT DETECTED Final   Bacteroides fragilis NOT DETECTED NOT DETECTED Final   Enterobacterales NOT DETECTED NOT DETECTED Final   Enterobacter cloacae complex NOT DETECTED NOT DETECTED Final   Escherichia coli NOT DETECTED NOT DETECTED Final   Klebsiella aerogenes NOT DETECTED NOT DETECTED Final   Klebsiella oxytoca NOT DETECTED NOT DETECTED Final   Klebsiella pneumoniae NOT DETECTED NOT DETECTED Final   Proteus species NOT DETECTED NOT DETECTED Final   Salmonella species NOT DETECTED NOT DETECTED Final   Serratia marcescens NOT DETECTED NOT DETECTED Final   Haemophilus influenzae NOT DETECTED NOT DETECTED Final   Neisseria meningitidis NOT DETECTED NOT DETECTED Final   Pseudomonas aeruginosa NOT DETECTED NOT DETECTED Final   Stenotrophomonas maltophilia NOT DETECTED NOT DETECTED Final   Candida albicans NOT DETECTED NOT DETECTED Final   Candida auris NOT DETECTED NOT DETECTED Final   Candida glabrata NOT DETECTED NOT DETECTED Final   Candida krusei NOT DETECTED NOT DETECTED Final   Candida parapsilosis NOT DETECTED NOT DETECTED Final   Candida tropicalis NOT DETECTED NOT DETECTED Final   Cryptococcus neoformans/gattii NOT DETECTED NOT DETECTED Final    Comment: Performed at [facility], [place] [address] [place], [place] [address]  Bacterial organism reflex     Status: Megan Brewer   Collection Time: 02/04/21  6:37 PM  Result Value Ref Range Status   Bacterial result 1 Comment  Final    Comment: (NOTE) Specimen has been received and  testing has been initiated. Performed At: [facility] [place], [place] [id number] Rush Farmer MD UQ:[id number]   Culture, Urine     Status: Megan Brewer   Collection Time: 02/05/21  2:00 AM   Specimen: Urine, Clean Catch  Result Value Ref Range Status   Specimen Description   Final    URINE, CLEAN CATCH Performed at [facility], [place] [address] [place], [place] [address]    Special Requests   Final    Megan Brewer Performed at [facility], [place] [address] [place], [place] [address]    Culture   Final    NO GROWTH Performed at [facility], [place] [address], [place], [place] [address]    Report Status 02/06/2021 FINAL  Final  Surgical pcr screen     Status: Abnormal   Collection Time: 02/07/21  8:03 AM   Specimen: Nasal Mucosa; Nasal Swab  Result Value Ref Range Status   MRSA, PCR NEGATIVE NEGATIVE Final   Staphylococcus aureus POSITIVE (A) NEGATIVE Final    Comment: (NOTE) The Xpert SA Assay (FDA approved for NASAL specimens in patients 18 years of age and older), is one component of a comprehensive surveillance program. It is not intended to diagnose infection nor to guide or monitor treatment. Performed at [facility], [place] [address], [place], [place] [address]      Time coordinating discharge: 35 minutes  SIGNED:   Aline August, MD  [facility] 02/14/2021, 10:06 AM

## 2021-02-15 ENCOUNTER — Other Ambulatory Visit: Payer: Self-pay | Admitting: Nurse Practitioner

## 2021-02-15 ENCOUNTER — Telehealth: Payer: Self-pay | Admitting: Physician Assistant

## 2021-02-15 NOTE — Telephone Encounter (Signed)
Telephone call: 02/15/2021 11:59 AM  Patient's home visit nurse from [facility] phone #[phone] called today after seeing the patient.  Apparently she was doing well with only a couple bowel movements a day after recent discharged yesterday after beind seen with Cdiff, but overnight seemed to "open up", now describing a diarrheal bowel movement every 15 to 30 minutes.  She is still on her Vancomycin as prescribed orally 4 times a day since being discharged.  She is asking if she can use Imodium or Lomotil.  Patient apparently has follow-up with her PCP early next week.  Explained the patient can definitely use Imodium per package instructions, if this is not helping her she can feel free to call our service back and we can discuss Lomotil.  Ellouise Newer, PA-C

## 2021-02-18 ENCOUNTER — Other Ambulatory Visit: Payer: Self-pay

## 2021-02-18 ENCOUNTER — Ambulatory Visit: Payer: Medicare PPO | Admitting: Family Medicine

## 2021-02-18 ENCOUNTER — Telehealth: Payer: Self-pay

## 2021-02-18 ENCOUNTER — Other Ambulatory Visit: Payer: Self-pay | Admitting: Psychiatry

## 2021-02-18 ENCOUNTER — Encounter: Payer: Self-pay | Admitting: Family Medicine

## 2021-02-18 VITALS — BP 98/64 | HR 80 | Temp 96.9°F | Ht 62.0 in | Wt 119.0 lb

## 2021-02-18 DIAGNOSIS — F99 Mental disorder, not otherwise specified: Secondary | ICD-10-CM

## 2021-02-18 DIAGNOSIS — F5105 Insomnia due to other mental disorder: Secondary | ICD-10-CM

## 2021-02-18 DIAGNOSIS — R6510 Systemic inflammatory response syndrome (SIRS) of non-infectious origin without acute organ dysfunction: Secondary | ICD-10-CM | POA: Diagnosis not present

## 2021-02-18 DIAGNOSIS — T80211D Bloodstream infection due to central venous catheter, subsequent encounter: Secondary | ICD-10-CM

## 2021-02-18 DIAGNOSIS — F419 Anxiety disorder, unspecified: Secondary | ICD-10-CM

## 2021-02-18 MED ORDER — POTASSIUM CHLORIDE CRYS ER 10 MEQ PO TBCR: 10.0000 meq | EXTENDED_RELEASE_TABLET | Freq: Two times a day (BID) | ORAL | 1 refills | Status: DC

## 2021-02-18 NOTE — Progress Notes (Signed)
Provider:  Alain Honey, MD  Careteam: Patient Care Team: [name], NP as PCP - General (Geriatric Medicine) Alda Berthold, DO as Consulting Physician (Neurology)  PLACE OF SERVICE:  [facility]  Advanced Directive information    Allergies  Allergen Reactions   Imitrex [Sumatriptan] Nausea And Vomiting    Ringing in the ears, migraines are worse   Tetracycline Nausea Only    Causes ringing in ears, dizziness, migraine, nausea   Corticosteroids Other (See Comments)    12/02/2016 interview by Melissa Montane: Oral dosing causes migraines/nausea/tinnitus. Prev tolerated IV and intranasal admin w/o difficulty.   Cefixime Other (See Comments)    Headache    Ciprofloxacin Other (See Comments)    Headache, dizziness, ringing in the ears   Doxycycline Other (See Comments)    unknown   Gabapentin     Throat swells/closes   Isometheptene-Dichloral-Apap Other (See Comments)    unknown   Ketorolac     12/02/2016 interview by BJS: Oral dosing prednisone/corticosteroids causes migraines/nausea/tinnitus. Prev tolerated IV and intranasal admin w/o difficulty.   Prednisone Other (See Comments)    Migraine, dizzy, ringing in ears-can take it IV 12/02/2016 interview by [name]: Oral prednisone dosing causes migraines/nausea/tinnitus. Prev tolerated IV and intranasal admin w/o difficulty.   Promethazine Other (See Comments)    causes severe abdominal pain when taken IV; however she can take PO or IM   Stadol [Butorphanol]     Cerebral pain   Erythromycin Base Diarrhea and Itching    Severe diarrhea   Propoxyphene Nausea Only    Chief Complaint  Patient presents with   Hospitalization Follow-up    Patient presents today for hospital stay from 02/04/2021-02/14/21 due to [place].     HPI: Patient is a 75 y.o. female this is a post hospitalization follow-up.  Was admitted on 621 due to sepsis probably from infected intravenous port and with continued C. difficile.  She had positive blood  cultures with gram-negative bacteria yet to be identified.  She received 2 weeks of IV meropenem and oral vancomycin for C. difficile was continued.  She was admitted through the ID clinic with rigors and malaise Since going home she has had no other fevers or chills.  She continues with some diarrhea.  She is no longer on IV antibiotics but does take vancomycin for C. difficile  Review of Systems:  Review of Systems  Constitutional:  Positive for malaise/fatigue. Negative for chills and fever.  Respiratory: Negative.    Cardiovascular: Negative.   Gastrointestinal:  Positive for abdominal pain and diarrhea.   Past Medical History:  Diagnosis Date   Anemia    Arachnoiditis    Bipolar disorder (HCC)    C. 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[place])    Lung cancer ([place]) 2011, 2014   Memory loss    Migraine    Opioid abuse ([place])    Osteoporosis    Presence of neurostimulator    Restless legs    SIRS (systemic inflammatory response syndrome) ([place])    Sleep apnea    Past Surgical History:  Procedure Laterality Date   ABDOMINAL HYSTERECTOMY     CARPAL TUNNEL RELEASE     CATARACT EXTRACTION  Bilateral 2019   CHOLECYSTECTOMY     COLONOSCOPY  2015   [facility]   CT LUNG SCREENING  2018   DG  BONE DENSITY ([place] HX)  2018   DIAGNOSTIC MAMMOGRAM  2019   GASTRIC BYPASS     LUNG CANCER SURGERY  02/2010   MULTIPLE TOOTH EXTRACTIONS     PORT-A-CATH REMOVAL Left 02/07/2021   Procedure: REMOVAL PORT-A-CATH;  Surgeon: Kinsinger, Arta Bruce, MD;  Location: [facility];  Service: General;  Laterality: Left;  60 MINUTES ROOM 4   SPINAL CORD STIMULATOR IMPLANT     Social History:   reports that she has never smoked. She has never used smokeless tobacco. She  reports previous alcohol use. She reports that she does not use drugs.  Family History  Problem Relation Age of Onset   Hypertension Mother    GER disease Mother    Dementia Mother    Osteoporosis Mother    Arthritis Mother    Bipolar disorder Mother    Parkinson's disease Father    Congestive Heart Failure Father    Pneumonia Father    Arthritis Father    Dementia Father    Depression Father    Asthma Sister    Depression Sister    Bipolar disorder Brother    Asthma Daughter    Colon cancer Neg Hx    Esophageal cancer Neg Hx    Stomach cancer Neg Hx    Rectal cancer Neg Hx     Medications: Patient's Medications  New Prescriptions   No medications on file  Previous Medications   ACETAMINOPHEN (TYLENOL) 500 MG TABLET    Take 1-2 tablets (500-1,000 mg total) by mouth every 8 (eight) hours as needed (Max dose 3000 mg in 24 hours).   AMLODIPINE (NORVASC) 10 MG TABLET    Take 1 tablet (10 mg total) by mouth daily.   AZELASTINE-FLUTICASONE 137-50 MCG/ACT SUSP    ONE SPRAY EACH NOSTRIL TWICE A DAY AS NEEDED   BACLOFEN (LIORESAL) 10 MG TABLET    Take 10 mg by mouth daily as needed for muscle spasms.   CALCIUM CITRATE 200 MG TABS    Take 200 mg of elemental calcium by mouth daily.   CHOLECALCIFEROL (VITAMIN D3) 25 MCG (1000 UNIT) TABLET    Take 1,000 Units by mouth daily.   CYCLOSPORINE (RESTASIS) 0.05 % OPHTHALMIC EMULSION    Place 1 drop into both eyes 2 (two) times daily.   DEXLANSOPRAZOLE (DEXILANT) 60 MG CAPSULE    Take 1 capsule (60 mg total) by mouth daily. Crack open the capsule and pour into spoon prior to ingestion. PLEASE SCHEDULE AN OFFICE VISIT FOR FURTHER REFILLS. Thank you   ERENUMAB-AOOE (AIMOVIG, 140 MG DOSE,) 70 MG/ML SOAJ    Inject 70 mg into the skin every 30 (thirty) days.   ESTRADIOL (ESTRACE) 0.1 MG/GM VAGINAL CREAM    Place 1 Applicatorful vaginally 3 (three) times a week.   FERROUS SULFATE 325 (65 FE) MG TABLET    Take 650 mg by mouth daily with breakfast.    HYDROXYZINE (VISTARIL) 25 MG CAPSULE    Take 1 capsule (25 mg total) by mouth 2 (two) times daily as needed for anxiety.   IBUPROFEN (ADVIL) 200 MG TABLET    Take 400 mg by mouth every 6 (six) hours as needed for mild pain.   IMMUNE GLOBULIN, HUMAN, (GAMMAGARD S/D LESS IGA) 10 G INJECTION    Inject 2 g/kg into the vein every 21 ( twenty-one) days.  LAMOTRIGINE (LAMICTAL) 200 MG TABLET    Take 1 tablet (200 mg total) by mouth at bedtime.   LASTACAFT 0.25 % SOLN    Place 1 drop into both eyes daily.   LEVOTHYROXINE (SYNTHROID) 75 MCG TABLET    Take one tablet by mouth once daily 30 minutes before breakfast on empty stomach.   MAGNESIUM OXIDE (MAG-OX) 400 MG TABLET    Take 400 mg by mouth daily.    MIRTAZAPINE (REMERON) 30 MG TABLET    Take 1 tablet (30 mg total) by mouth at bedtime.   OXYBUTYNIN (DITROPAN) 5 MG/5ML SYRUP    Take 2.5 mLs (2.5 mg total) by mouth 2 (two) times daily.   POTASSIUM CHLORIDE SA (KLOR-CON) 20 MEQ TABLET    TAKE TWO TABLETS BY MOUTH DAILY (MAY DISSOLVE IN WATER)   ROPINIROLE (REQUIP) 1 MG TABLET    TAKE 1 TABLET BY MOUTH AT BEDTIME   SACCHAROMYCES BOULARDII (FLORASTOR) 250 MG CAPSULE    Take 1 capsule (250 mg total) by mouth 2 (two) times daily.   SUCRALFATE (CARAFATE) 1 GM/10ML SUSPENSION    Take 20 mLs (2 g total) by mouth 3 (three) times daily as needed (gerd).   TAMSULOSIN (FLOMAX) 0.4 MG CAPS CAPSULE    Take 1 capsule (0.4 mg total) by mouth daily.   TIZANIDINE (ZANAFLEX) 4 MG TABLET    Take 1 tablet (4 mg total) by mouth every 6 (six) hours as needed for muscle spasms.   TORSEMIDE (DEMADEX) 20 MG TABLET    TAKE 1 TABLET BY MOUTH DAILY   TRIAMCINOLONE CREAM (KENALOG) 0.1 %    Apply 1 application topically 2 (two) times daily as needed (rash).   UNABLE TO FIND    Take 1 tablet by mouth daily. Med Name: [name] 1 by mouth daily for UTI prevention   VANCOMYCIN (VANCOCIN) 125 MG CAPSULE    Take 1 capsule (125 mg total) by mouth in the morning and at bedtime for 7 days.    VANCOMYCIN (VANCOCIN) 125 MG CAPSULE    Take 1 capsule (125 mg total) by mouth daily for 7 days.   VANCOMYCIN (VANCOCIN) 125 MG CAPSULE    Take 1 capsule (125 mg total) by mouth every other day for 14 days.   VITAMIN B-12 (CYANOCOBALAMIN) 1000 MCG TABLET    Take 1 tablet (1,000 mcg total) by mouth daily.   ZINC OXIDE, TOPICAL, 10 % CREA    Apply 1 application topically 2 (two) times daily as needed (rash).  Modified Medications   No medications on file  Discontinued Medications   No medications on file    Physical Exam:  Vitals:   02/18/21 1009  Weight: 119 lb (54 kg)   Body mass index is 21.77 kg/m. Wt Readings from Last 3 Encounters:  02/18/21 119 lb (54 kg)  02/13/21 132 lb 7.9 oz (60.1 kg)  02/04/21 122 lb (55.3 kg)    Physical Exam Vitals and nursing note reviewed.  Constitutional:      Appearance: She is ill-appearing.  HENT:     Head: Normocephalic.  Cardiovascular:     Rate and Rhythm: Normal rate and regular rhythm.  Pulmonary:     Effort: Pulmonary effort is normal.  Abdominal:     General: Abdomen is flat. Bowel sounds are normal.     Palpations: Abdomen is soft.  Neurological:     General: No focal deficit present.     Mental Status: She is alert and oriented to person, place, and  time.    Labs reviewed: Basic Metabolic Panel: Recent Labs    06/10/20 1545 06/10/20 1549 01/14/21 1547 01/14/21 2158 01/15/21 0030 01/15/21 0500 01/16/21 0538 01/21/21 0339 01/22/21 0342 02/10/21 0459 02/11/21 0444 02/12/21 0515  NA  --    < > 135   < >  --  135   < > 138   < > 140 138 136  K  --    < > 2.8*   < >  --  4.0   < > 3.0*   < > 4.4 4.5 4.4  CL  --    < > 101   < >  --  106   < > 109   < > 109 109 105  CO2  --    < > 23   < >  --  25   < > 22   < > 26 24 24   GLUCOSE  --    < > 136*   < >  --  121*   < > 129*   < > 88 84 84  BUN  --    < > 23   < >  --  15   < > 14   < > 14 14 17   CREATININE  --    < > 0.81   < >  --  0.65   < > 0.80   < > 0.68 0.75 0.95   CALCIUM  --    < > 8.9   < >  --  8.1*   < > 7.9*   < > 8.8* 8.6* 8.6*  MG  --    < > 1.4*  --   --  3.0*  --  1.9  --   --   --   --   PHOS  --   --   --   --  3.2 2.8  --   --   --   --   --   --   TSH 5.05*  --   --   --   --  2.990  --   --   --   --   --   --    < > = values in this interval not displayed.   Liver Function Tests: Recent Labs    01/15/21 0500 02/04/21 1711 02/05/21 0310  AST 33 35 36  ALT 29 32 31  ALKPHOS 56 78 66  BILITOT 0.4 0.3 0.4  PROT 6.5 8.1 6.5  ALBUMIN 2.9* 3.6 2.9*   Recent Labs    08/28/20 1426 12/09/20 1138  LIPASE 46 32   No results for input(s): AMMONIA in the last 8760 hours. CBC: Recent Labs    01/22/21 0342 01/29/21 1205 02/04/21 1711 02/08/21 0625 02/10/21 0459 02/11/21 0444 02/12/21 0515  WBC 5.0 9.0 6.2   < > 3.2* 4.0 3.4*  NEUTROABS 3.3 6.0 4.3  --   --   --   --   HGB 10.7* 12.8 12.2   < > 11.3* 10.9* 10.9*  HCT 32.7* 38.3 38.5   < > 35.2* 34.3* 34.3*  MCV 89.8 88.7 93.0   < > 92.6 93.0 93.5  PLT 211 304.0 200   < > 147* 143* 146*   < > = values in this interval not displayed.   Lipid Panel: No results for input(s): CHOL, HDL, LDLCALC, TRIG, CHOLHDL, LDLDIRECT in the last 8760 hours. TSH: Recent Labs    06/10/20 1545 01/15/21  0500  TSH 5.05* 2.990   A1C: No results found for: HGBA1C   Assessment/Plan  1. SIRS (systemic inflammatory response syndrome) (HCC) Patient continues on vancomycin for C. difficile but has finished other parenteral antibiotics.  Identification of gram-negative organism is still in process.  Main purpose of her visit today was to repeat CBC and BMP and the results of these tests are pending. - CBC (no diff) - Basic Metabolic Panel   Alain Honey, MD [place] [phone]

## 2021-02-18 NOTE — Telephone Encounter (Signed)
Patient has request refill on medication "Oxybutynin". Medication was refilled by Dr. Shelly Coss 01/22/2021. Medication pend and sent to PCP Dewaine Oats Carlos American, NP for approval. Please Advise.

## 2021-02-18 NOTE — Telephone Encounter (Signed)
Ok to approve 

## 2021-02-18 NOTE — Telephone Encounter (Signed)
Beth w/ [name] called and requested nursing for patient. Once a week for 1 week Twice a week for 1 week Once a week for 4 weeks. Please advise.

## 2021-02-19 ENCOUNTER — Telehealth: Payer: Self-pay | Admitting: *Deleted

## 2021-02-19 LAB — CBC
HCT: 40.5 % (ref 35.0–45.0)
Hemoglobin: 13.5 g/dL (ref 11.7–15.5)
MCH: 30.1 pg (ref 27.0–33.0)
MCHC: 33.3 g/dL (ref 32.0–36.0)
MCV: 90.4 fL (ref 80.0–100.0)
MPV: 11.4 fL (ref 7.5–12.5)
Platelets: 271 10*3/uL (ref 140–400)
RBC: 4.48 10*6/uL (ref 3.80–5.10)
RDW: 13.3 % (ref 11.0–15.0)
WBC: 8.8 10*3/uL (ref 3.8–10.8)

## 2021-02-19 LAB — BASIC METABOLIC PANEL
BUN/Creatinine Ratio: 30 (calc) — ABNORMAL HIGH (ref 6–22)
BUN: 29 mg/dL — ABNORMAL HIGH (ref 7–25)
CO2: 26 mmol/L (ref 20–32)
Calcium: 10 mg/dL (ref 8.6–10.4)
Chloride: 98 mmol/L (ref 98–110)
Creat: 0.97 mg/dL — ABNORMAL HIGH (ref 0.60–0.93)
Glucose, Bld: 80 mg/dL (ref 65–139)
Potassium: 4.4 mmol/L (ref 3.5–5.3)
Sodium: 138 mmol/L (ref 135–146)

## 2021-02-19 NOTE — Telephone Encounter (Signed)
[employer] Nurse, Chrys Racer, called and stated that she had the patient on the other line.  Stated that patient is confused about her Potassium dosage.  Stated that it was changed to 45meq twice daily from 66meq twice daily.   Patient feels like she needs to continue with the Potassium 25meq twice daily.   Please Advise.

## 2021-02-19 NOTE — Telephone Encounter (Signed)
Spoke with patient to follow up and see how she is feeling. Patient states that she is better today. She states that she is currently on Vancomycin 125 mg BID. She states that she started the Imodium the other day and it has helped but she only takes it as needed if she has diarrhea. She states that she usually has 1 "really bad stool" a day and none after that. Patient denies any fever or blood in the stools. Patient wanted to thank Dr. Havery Moros for his concerns. Patient had no other concerns at the end of the call.

## 2021-02-19 NOTE — Progress Notes (Signed)
Cardiology Office Note   Date:  02/20/2021   ID:  Megan Brewer 25-Jun-1946, MRN [id number]  PCP:  [name], NP  Cardiologist:   None   Chief Complaint  Patient presents with   Weakness       History of Present Illness: Megan Brewer is a 75 y.o. female who presents for evaluation of chest pain.  She was seen by Dr. Geraldo Pitter previously.   She has had many ED visits this year for various complaints.  She had a negative perfusion study in 2020.    She was in the hospital recently with sepsis.  We did see her at that time because she had an elevated troponin.She had a CT that ruled out a thoracic aneurysm or pulmonary embolism.  Echo was unremarkable.      Our plan was to potentially do a perfusion study as an outpatient  Since going home she has been weak.  She is having dizzy spells particularly when she gets up.  She feels very fatigued.  Last for 30 minutes to an hour.  She has not had frank syncope.  She has not had any new chest discomfort, shortness of breath, PND or orthopnea.  She still taking vancomycin for C. difficile.  She is eating.  She is very frail.   Past Medical History:  Diagnosis Date   Anemia    Arachnoiditis    Bipolar disorder (HCC)    C. 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[place])    Lung cancer ([place]) 2011, 2014   Memory loss    Migraine    Opioid abuse (HCC)    Osteoporosis    Presence of neurostimulator    Restless legs    SIRS (systemic inflammatory response syndrome) ([place])    Sleep apnea     Past Surgical History:  Procedure Laterality Date   ABDOMINAL HYSTERECTOMY     CARPAL TUNNEL RELEASE     CATARACT EXTRACTION Bilateral 2019   CHOLECYSTECTOMY     COLONOSCOPY   2015   [facility]   CT LUNG SCREENING  2018   DG  BONE DENSITY ([place] HX)  2018   DIAGNOSTIC MAMMOGRAM  2019   GASTRIC BYPASS     LUNG CANCER SURGERY  02/2010   MULTIPLE TOOTH EXTRACTIONS     PORT-A-CATH REMOVAL Left 02/07/2021   Procedure: REMOVAL PORT-A-CATH;  Surgeon: Mickeal Skinner, MD;  Location: [facility];  Service: General;  Laterality: Left;  60 MINUTES ROOM 4   SPINAL CORD STIMULATOR IMPLANT       Current Outpatient Medications  Medication Sig Dispense Refill   acetaminophen (TYLENOL) 500 MG tablet Take 1-2 tablets (500-1,000 mg total) by mouth every 8 (eight) hours as needed (Max dose 3000 mg in 24 hours).     amLODipine (NORVASC) 2.5 MG tablet Take 1 tablet (2.5 mg total) by mouth daily. 90 tablet 1   Azelastine-Fluticasone 137-50 MCG/ACT SUSP ONE SPRAY EACH NOSTRIL TWICE A DAY AS NEEDED 23 g 0   baclofen (LIORESAL) 10 MG tablet Take 10 mg by mouth daily as needed for muscle spasms.     Calcium Citrate 200 MG TABS Take 200 mg of elemental calcium by  mouth daily.     cholecalciferol (VITAMIN D3) 25 MCG (1000 UNIT) tablet Take 1,000 Units by mouth daily.     Erenumab-aooe (AIMOVIG, 140 MG DOSE,) 70 MG/ML SOAJ Inject 70 mg into the skin every 30 (thirty) days.     estradiol (ESTRACE) 0.1 MG/GM vaginal cream Place 1 Applicatorful vaginally 3 (three) times a week.     ferrous sulfate 325 (65 FE) MG tablet Take 650 mg by mouth daily with breakfast.     hydrOXYzine (VISTARIL) 25 MG capsule Take 1 capsule (25 mg total) by mouth 2 (two) times daily as needed for anxiety.     ibuprofen (ADVIL) 200 MG tablet Take 400 mg by mouth every 6 (six) hours as needed for mild pain.     immune globulin, human, (GAMMAGARD S/D LESS IGA) 10 g injection Inject 2 g/kg into the vein every 21 ( twenty-one) days.     lamoTRIgine (LAMICTAL) 200 MG tablet Take 1 tablet (200 mg total) by mouth at bedtime.     LASTACAFT 0.25 % SOLN Place 1 drop into both eyes daily.     levothyroxine (SYNTHROID) 75 MCG tablet  Take one tablet by mouth once daily 30 minutes before breakfast on empty stomach. 90 tablet 1   magnesium oxide (MAG-OX) 400 MG tablet Take 400 mg by mouth daily.      mirtazapine (REMERON) 30 MG tablet Take 1 tablet (30 mg total) by mouth at bedtime.     oxybutynin (DITROPAN) 5 MG/5ML syrup Take 2.5 mLs (2.5 mg total) by mouth 2 (two) times daily. 120 mL 0   potassium chloride (KLOR-CON) 10 MEQ tablet Take 1 tablet (10 mEq total) by mouth 2 (two) times daily. 90 tablet 1   potassium chloride SA (KLOR-CON) 20 MEQ tablet TAKE TWO TABLETS BY MOUTH DAILY (MAY DISSOLVE IN WATER) 180 tablet 1   rOPINIRole (REQUIP) 1 MG tablet TAKE 1 TABLET BY MOUTH AT BEDTIME 90 tablet 0   saccharomyces boulardii (FLORASTOR) 250 MG capsule Take 1 capsule (250 mg total) by mouth 2 (two) times daily. 30 capsule 3   sucralfate (CARAFATE) 1 GM/10ML suspension Take 20 mLs (2 g total) by mouth 3 (three) times daily as needed (gerd).     tamsulosin (FLOMAX) 0.4 MG CAPS capsule Take 1 capsule (0.4 mg total) by mouth daily. 30 capsule 1   tiZANidine (ZANAFLEX) 4 MG tablet Take 1 tablet (4 mg total) by mouth every 6 (six) hours as needed for muscle spasms. 10 tablet 0   torsemide (DEMADEX) 20 MG tablet TAKE 1 TABLET BY MOUTH DAILY 90 tablet 1   triamcinolone cream (KENALOG) 0.1 % Apply 1 application topically 2 (two) times daily as needed (rash).     UNABLE TO FIND Take 1 tablet by mouth daily. Med Name: Megan Brewer 1 by mouth daily for UTI prevention     vancomycin (VANCOCIN) 125 MG capsule Take 1 capsule (125 mg total) by mouth in the morning and at bedtime for 7 days. 14 capsule 0   [START ON 02/26/2021] vancomycin (VANCOCIN) 125 MG capsule Take 1 capsule (125 mg total) by mouth daily for 7 days. 7 capsule 0   [START ON 03/06/2021] vancomycin (VANCOCIN) 125 MG capsule Take 1 capsule (125 mg total) by mouth every other day for 14 days. 7 capsule 0   vitamin B-12 (CYANOCOBALAMIN) 1000 MCG tablet Take 1 tablet (1,000 mcg total) by mouth  daily. 30 tablet 0   ZINC OXIDE, TOPICAL, 10 % CREA Apply 1 application topically 2 (  two) times daily as needed (rash).     cycloSPORINE (RESTASIS) 0.05 % ophthalmic emulsion Place 1 drop into both eyes 2 (two) times daily.     dexlansoprazole (DEXILANT) 60 MG capsule Take 1 capsule (60 mg total) by mouth daily. Crack open the capsule and pour into spoon prior to ingestion. PLEASE SCHEDULE AN OFFICE VISIT FOR FURTHER REFILLS. Thank you 30 capsule 0   No current facility-administered medications for this visit.    Allergies:   Imitrex [sumatriptan], Tetracycline, Corticosteroids, Cefixime, Ciprofloxacin, Doxycycline, Gabapentin, Isometheptene-dichloral-apap, Ketorolac, Prednisone, Promethazine, Stadol [butorphanol], Erythromycin base, and Propoxyphene    ROS:  Please see the history of present illness.   Otherwise, review of systems are positive for none.   All other systems are reviewed and negative.    PHYSICAL EXAM: VS:  BP (!) 100/52   Pulse 74   Ht 5\' 2"  (1.575 m)   Wt 118 lb 9.6 oz (53.8 kg)   SpO2 98%   BMI 21.69 kg/m  , BMI Body mass index is 21.69 kg/m. GENERAL: Frail appearing HEENT:  Pupils equal round and reactive, fundi not visualized, oral mucosa unremarkable NECK:  No jugular venous distention, waveform within normal limits, carotid upstroke brisk and symmetric, no bruits, no thyromegaly LYMPHATICS:  No cervical, inguinal adenopathy LUNGS:  Clear to auscultation bilaterally BACK:  No CVA tenderness CHEST:  Unremarkable HEART:  PMI not displaced or sustained,S1 and S2 within normal limits, no S3, no S4, no clicks, no rubs, no murmurs ABD:  Flat, positive bowel sounds normal in frequency in pitch, no bruits, no rebound, no guarding, no midline pulsatile mass, no hepatomegaly, no splenomegaly EXT:  2 plus pulses throughout, positive edema, no cyanosis no clubbing SKIN:  No rashes no nodules NEURO:  Cranial nerves II through XII grossly intact, motor grossly intact  throughout PSYCH:  Cognitively intact, oriented to person place and time    EKG:  EKG is not ordered today.    Recent Labs: 01/15/2021: TSH 2.990 01/17/2021: B Natriuretic Peptide 44.8 01/21/2021: Magnesium 1.9 02/05/2021: ALT 31 02/18/2021: BUN 29; Creat 0.97; Hemoglobin 13.5; Platelets 271; Potassium 4.4; Sodium 138    Lipid Panel    Component Value Date/Time   CHOL  01/27/2010 0550    144        ATP III CLASSIFICATION:  <200     mg/dL   Desirable  200-239  mg/dL   Borderline High  >=240    mg/dL   High          TRIG 68 01/27/2010 0550   HDL 58 01/27/2010 0550   CHOLHDL 2.5 01/27/2010 0550   VLDL 14 01/27/2010 0550   LDLCALC  01/27/2010 0550    72        Total Cholesterol/HDL:CHD Risk Coronary Heart Disease Risk Table                     Men   Women  1/2 Average Risk   3.4   3.3  Average Risk       5.0   4.4  2 X Average Risk   9.6   7.1  3 X Average Risk  23.4   11.0        Use the calculated Patient Ratio above and the CHD Risk Table to determine the patient's CHD Risk.        ATP III CLASSIFICATION (LDL):  <100     mg/dL   Optimal  100-129  mg/dL   Near  or Above                    Optimal  130-159  mg/dL   Borderline  160-189  mg/dL   High  >190     mg/dL   Very High      Wt Readings from Last 3 Encounters:  02/20/21 118 lb 9.6 oz (53.8 kg)  02/18/21 119 lb (54 kg)  02/13/21 132 lb 7.9 oz (60.1 kg)      Other studies Reviewed: Additional studies/ records that were reviewed today include: Hospital records. Review of the above records demonstrates:  Please see elsewhere in the note.     ASSESSMENT AND PLAN:  Chest pain, resolved  Elevated troponin At some point and probably will plan another ischemia evaluation though we thought that the elevated troponin was likely demand ischemia.  Mild to Mod MR and TR  I will follow this up with repeat echoes in the future.   Sepsis 2/2 suspected bacteremia She is being followed by her primary providers    Hypertension  Her blood pressure is actually running low.  Today in the office was low with an orthostatic BP drop. .  I would like to reduce her Norvasc to 2.5 mg daily   Hypokalemia/Hypomagnesium She just had labs the other day and her potassium was back to baseline.    Current medicines are reviewed at length with the patient today.  The patient does not have concerns regarding medicines.  The following changes have been made: As above  Labs/ tests ordered today include: None No orders of the defined types were placed in this encounter.    Disposition:   FU with APP in 2 months.     Signed, Minus Breeding, MD  02/20/2021 9:49 AM    [facility]

## 2021-02-19 NOTE — Telephone Encounter (Signed)
I was out of the office and just returning today and saw this message. [place] can you please contact this patient and see how she is doing? Thanks

## 2021-02-20 ENCOUNTER — Other Ambulatory Visit: Payer: Self-pay

## 2021-02-20 ENCOUNTER — Encounter: Payer: Self-pay | Admitting: Cardiology

## 2021-02-20 ENCOUNTER — Ambulatory Visit: Payer: Medicare PPO | Admitting: Cardiology

## 2021-02-20 VITALS — BP 100/52 | HR 74 | Ht 62.0 in | Wt 118.6 lb

## 2021-02-20 DIAGNOSIS — R0789 Other chest pain: Secondary | ICD-10-CM

## 2021-02-20 MED ORDER — POTASSIUM CHLORIDE ER 10 MEQ PO TBCR: 10.0000 meq | EXTENDED_RELEASE_TABLET | Freq: Four times a day (QID) | ORAL | 1 refills | Status: DC

## 2021-02-20 MED ORDER — AMLODIPINE BESYLATE 2.5 MG PO TABS: 2.5000 mg | ORAL_TABLET | Freq: Every day | ORAL | 1 refills | Status: DC

## 2021-02-20 MED ORDER — DEXLANSOPRAZOLE 60 MG PO CPDR: 60.0000 mg | DELAYED_RELEASE_CAPSULE | Freq: Every day | ORAL | 11 refills | Status: DC

## 2021-02-20 NOTE — Telephone Encounter (Signed)
Meaning either diagnosis can work.

## 2021-02-20 NOTE — Telephone Encounter (Signed)
Correct, bacteremia was caused by c diff.

## 2021-02-20 NOTE — Telephone Encounter (Signed)
Megan Brewer w/ [name] was advised. She would like to know if [name] knows the primary diagnosis for patients insurance company for the home health. Megan Brewer stated that the insurance wants to verify id the dx is bacteremia or c.diff. Please advise.

## 2021-02-20 NOTE — Patient Instructions (Signed)
Medication Instructions:  DECREASE YOUR AMLODIPINE TO 2.5 MG DAILY   *If you need a refill on your cardiac medications before your next appointment, please call your pharmacy*  Lab Work: NONE  Testing/Procedures: NONE  Follow-Up: At [facility], you and your health needs are our priority.  As part of our continuing mission to provide you with exceptional heart care, we have created designated Provider Care Teams.  These Care Teams include your primary Cardiologist (physician) and Advanced Practice Providers (APPs -  Physician Assistants and Nurse Practitioners) who all work together to provide you with the care you need, when you need it.  We recommend signing up for the patient portal called "MyChart".  Sign up information is provided on this After Visit Summary.  MyChart is used to connect with patients for Virtual Visits (Telemedicine).  Patients are able to view lab/test results, encounter notes, upcoming appointments, etc.  Non-urgent messages can be sent to your provider as well.   To learn more about what you can do with MyChart, go to [email or web address].    Your next appointment:   2 month(s)  The format for your next appointment:   In Person  Provider:    WITH NP/PA

## 2021-02-20 NOTE — Telephone Encounter (Signed)
Patient Notified and stated that she wants the Potassium 10 meq One tablet four times daily. Stated that she did discuss this with Dr. Sabra Heck.  Dr. Sabra Heck confirmed patient is to be taking 33meq daily.

## 2021-02-20 NOTE — Telephone Encounter (Signed)
Megan Honour, MD  You 45 minutes ago (9:16 AM)   Dose is 58meq/day       Tried calling patient, LMOM to return call.

## 2021-02-24 NOTE — Telephone Encounter (Signed)
Called Beth w/ [facility] to advise and no answer, [place].

## 2021-02-25 ENCOUNTER — Other Ambulatory Visit: Payer: Self-pay

## 2021-02-25 ENCOUNTER — Encounter: Payer: Self-pay | Admitting: Psychiatry

## 2021-02-25 ENCOUNTER — Ambulatory Visit: Payer: Medicare PPO | Admitting: Psychiatry

## 2021-02-25 DIAGNOSIS — F419 Anxiety disorder, unspecified: Secondary | ICD-10-CM

## 2021-02-25 DIAGNOSIS — F3162 Bipolar disorder, current episode mixed, moderate: Secondary | ICD-10-CM | POA: Diagnosis not present

## 2021-02-25 DIAGNOSIS — F5105 Insomnia due to other mental disorder: Secondary | ICD-10-CM | POA: Diagnosis not present

## 2021-02-25 DIAGNOSIS — F99 Mental disorder, not otherwise specified: Secondary | ICD-10-CM | POA: Diagnosis not present

## 2021-02-25 MED ORDER — HYDROXYZINE PAMOATE 25 MG PO CAPS
25.0000 mg | ORAL_CAPSULE | Freq: Two times a day (BID) | ORAL | 5 refills | Status: DC | PRN
Start: 2021-02-25 — End: 2021-05-28

## 2021-02-25 MED ORDER — MIRTAZAPINE 30 MG PO TABS: 30.0000 mg | ORAL_TABLET | Freq: Every day | ORAL | 1 refills | Status: DC

## 2021-02-25 MED ORDER — OXYBUTYNIN CHLORIDE 5 MG/5ML PO SYRP
2.5000 mg | ORAL_SOLUTION | Freq: Two times a day (BID) | ORAL | 1 refills | Status: DC
Start: 2021-02-25 — End: 2021-09-09

## 2021-02-25 MED ORDER — LAMOTRIGINE 200 MG PO TABS: 200.0000 mg | ORAL_TABLET | Freq: Every day | ORAL | 1 refills | Status: DC

## 2021-02-25 NOTE — Progress Notes (Signed)
   02/25/21 1126  Facial and Oral Movements  Muscles of Facial Expression 0  Lips and Perioral Area 0  Jaw 0  Tongue 1  Extremity Movements  Upper (arms, wrists, hands, fingers) 1  Lower (legs, knees, ankles, toes) 0  Trunk Movements  Neck, shoulders, hips 0  Overall Severity  Severity of abnormal movements (highest score from questions above) 0  Incapacitation due to abnormal movements 1  Patient's awareness of abnormal movements (rate only patient's report) 2  Dental Status  Current problems with teeth and/or dentures? No  AIMS Total Score  AIMS Total Score 5

## 2021-02-25 NOTE — Progress Notes (Signed)
Megan Brewer [id number] [birth year] 75 y.o.  Subjective:   Patient ID:  Megan Brewer is a 75 y.o. (DOB [birth year]) female.  Chief Complaint:  Chief Complaint  Patient presents with   Follow-up    Mood disturbance and anxiety    HPI Megan Brewer presents to the office today for follow-up of anxiety, mood disturbance, and insomnia. Megan Brewer reports that Megan Brewer has been hospitalized for a total of 18 days since 01/14/21 including ICU. Reports that Megan Brewer was hospitalized with cdiff and sepsis. Megan Brewer reports that Megan Brewer has been feeling "weak, shaky, and tired."   Megan Brewer describes her mood as "frustrated but still able to laugh at myself" and has been able to find some humor in her situation. Megan Brewer reports that Megan Brewer awakened having a panic attack this morning and had another panic attack last week. Megan Brewer reports that Megan Brewer had a couple of panic attacks during hospitalization. Megan Brewer reports frequent worry. Sleeping ok. Appetite is "raging." Lost 25 lbs from Thanksgiving to January. Megan Brewer reports that Megan Brewer has not lost any more weight. Denies elevated mood. Megan Brewer reports difficulty with concentration. Megan Brewer reports that sometimes Megan Brewer has difficulty following a TV show and other times can follow it. Megan Brewer reports anhedonia. Megan Brewer reports that Megan Brewer has the desire to do some things, "but just don't have the energy." Denies SI.   Megan Brewer reports that Megan Brewer only sees grandchildren about once a year and is sad that Megan Brewer will "only be able to sit and watch."   Megan Brewer reports that higher dose of Mirtazapine has been mood, anxiety, and insomnia. Megan Brewer reports that hydroxyzine prn is helpful for panic s/s.   Megan Brewer reports that RLS seems to be worsening. Megan Brewer reports, "I still shake."   Past Psychiatric Medication Trials: Sertraline- Was effective for about 25 years and no longer seems to be as effective. Prozac Paxil Cymbalta- may have caused psychosis. Remeron-Helpful for her mood and caused her legs to give out.  Depakote Trileptal-  shaking and night sweats Gabapentin- Had throat swelling Lithium- Edema. Minimally effective Seroquel Risperidone Zyprexa- Effective for insomnia, anxiety, and mood. Excessive wt gain.  Saphris Lamotrigine Melatonin Ativan- Reports taking until 2 months ago. Valium- "great for my depression." Xanax Trazodone- Ineffective Requip- Took for years at 0.5 mg po qd. Ingrezza- multiple side effects  AIMS    Flowsheet Row Office Visit from 02/25/2021 in [place] Office Visit from 06/14/2020 in [place] Visit from 06/23/2019 in [place] Total Score 5 15 0      PHQ2-9    [place] Office Visit from 02/04/2021 in [facility] Clinical Support from 09/27/2020 in [place] Visit from 06/10/2020 in [place] Visit from 09/25/2019 in [place] Visit from 07/05/2019 in [place]  PHQ-2 Total Score 0 0 0 0 0      Flowsheet Row Admission (Discharged) from 02/04/2021 in [place] ED to Hosp-Admission (Discharged) from 01/14/2021 in [place] ED from 01/11/2021 in [place] No Risk No Risk No Risk        Review of Systems:  Review of Systems  Gastrointestinal:  Positive for diarrhea.  Musculoskeletal:        Ambulates with walker   Medications: I have reviewed the patient's current medications.  Current Outpatient Medications  Medication Sig  Dispense Refill   acetaminophen (TYLENOL) 500 MG tablet Take 1-2 tablets (500-1,000 mg total) by mouth every 8 (eight) hours as needed (Max dose 3000 mg in 24 hours).     amLODipine (NORVASC) 2.5 MG tablet Take 1 tablet (2.5 mg total) by mouth daily. 90 tablet 1   Azelastine-Fluticasone 137-50 MCG/ACT SUSP ONE SPRAY EACH NOSTRIL TWICE A DAY AS NEEDED  23 g 0   baclofen (LIORESAL) 10 MG tablet Take 10 mg by mouth daily as needed for muscle spasms.     Calcium Citrate 200 MG TABS Take 200 mg of elemental calcium by mouth daily.     cholecalciferol (VITAMIN D3) 25 MCG (1000 UNIT) tablet Take 1,000 Units by mouth daily.     cycloSPORINE (RESTASIS) 0.05 % ophthalmic emulsion Place 1 drop into both eyes 2 (two) times daily.     dexlansoprazole (DEXILANT) 60 MG capsule Take 1 capsule (60 mg total) by mouth daily. Crack open the capsule and pour into spoon prior to ingestion. 30 capsule 11   Erenumab-aooe (AIMOVIG, 140 MG DOSE,) 70 MG/ML SOAJ Inject 70 mg into the skin every 30 (thirty) days.     estradiol (ESTRACE) 0.1 MG/GM vaginal cream Place 1 Applicatorful vaginally 3 (three) times a week.     ferrous sulfate 325 (65 FE) MG tablet Take 650 mg by mouth daily with breakfast.     ibuprofen (ADVIL) 200 MG tablet Take 400 mg by mouth every 6 (six) hours as needed for mild pain.     immune globulin, human, (GAMMAGARD S/D LESS IGA) 10 g injection Inject 2 g/kg into the vein every 21 ( twenty-one) days.     levothyroxine (SYNTHROID) 75 MCG tablet Take one tablet by mouth once daily 30 minutes before breakfast on empty stomach. 90 tablet 1   loperamide (IMODIUM A-D) 2 MG tablet Take 2 mg by mouth 4 (four) times daily as needed for diarrhea or loose stools.     magnesium oxide (MAG-OX) 400 MG tablet Take 400 mg by mouth daily.      potassium chloride (KLOR-CON 10) 10 MEQ tablet Take 1 tablet (10 mEq total) by mouth 4 (four) times daily. 120 tablet 1   rOPINIRole (REQUIP) 1 MG tablet TAKE 1 TABLET BY MOUTH AT BEDTIME 90 tablet 0   saccharomyces boulardii (FLORASTOR) 250 MG capsule Take 1 capsule (250 mg total) by mouth 2 (two) times daily. 30 capsule 3   sucralfate (CARAFATE) 1 GM/10ML suspension Take 20 mLs (2 g total) by mouth 3 (three) times daily as needed (gerd).     tamsulosin (FLOMAX) 0.4 MG CAPS capsule Take 1 capsule (0.4 mg total) by mouth daily. 30  capsule 1   tiZANidine (ZANAFLEX) 4 MG tablet Take 1 tablet (4 mg total) by mouth every 6 (six) hours as needed for muscle spasms. 10 tablet 0   torsemide (DEMADEX) 20 MG tablet TAKE 1 TABLET BY MOUTH DAILY 90 tablet 1   triamcinolone cream (KENALOG) 0.1 % Apply 1 application topically 2 (two) times daily as needed (rash).     UNABLE TO FIND Take 1 tablet by mouth daily. Med Name: [name] 1 by mouth daily for UTI prevention     [START ON 02/26/2021] vancomycin (VANCOCIN) 125 MG capsule Take 1 capsule (125 mg total) by mouth daily for 7 days. 7 capsule 0   vitamin B-12 (CYANOCOBALAMIN) 1000 MCG tablet Take 1 tablet (1,000 mcg total) by mouth daily. 30 tablet 0   ZINC OXIDE, TOPICAL, 10 % CREA Apply  1 application topically 2 (two) times daily as needed (rash).     hydrOXYzine (VISTARIL) 25 MG capsule Take 1 capsule (25 mg total) by mouth 2 (two) times daily as needed for anxiety. 30 capsule 5   lamoTRIgine (LAMICTAL) 200 MG tablet Take 1 tablet (200 mg total) by mouth at bedtime. 90 tablet 1   LASTACAFT 0.25 % SOLN Place 1 drop into both eyes daily. (Patient not taking: Reported on 02/25/2021)     mirtazapine (REMERON) 30 MG tablet Take 1 tablet (30 mg total) by mouth at bedtime. 90 tablet 1   oxybutynin (DITROPAN) 5 MG/5ML syrup Take 2.5 mLs (2.5 mg total) by mouth 2 (two) times daily. 473 mL 1   vancomycin (VANCOCIN) 125 MG capsule Take 1 capsule (125 mg total) by mouth in the morning and at bedtime for 7 days. 14 capsule 0   [START ON 03/06/2021] vancomycin (VANCOCIN) 125 MG capsule Take 1 capsule (125 mg total) by mouth every other day for 14 days. 7 capsule 0   No current facility-administered medications for this visit.    Medication Side Effects: None  Allergies:  Allergies  Allergen Reactions   Imitrex [Sumatriptan] Nausea And Vomiting    Ringing in the ears, migraines are worse   Tetracycline Nausea Only    Causes ringing in ears, dizziness, migraine, nausea   Corticosteroids Other  (See Comments)    12/02/2016 interview by YIF: Oral dosing causes migraines/nausea/tinnitus. Prev tolerated IV and intranasal admin w/o difficulty.   Cefixime Other (See Comments)    Headache    Ciprofloxacin Other (See Comments)    Headache, dizziness, ringing in the ears   Doxycycline Other (See Comments)    unknown   Gabapentin     Throat swells/closes   Isometheptene-Dichloral-Apap Other (See Comments)    unknown   Ketorolac     12/02/2016 interview by OYD: Oral dosing prednisone/corticosteroids causes migraines/nausea/tinnitus. Prev tolerated IV and intranasal admin w/o difficulty.   Prednisone Other (See Comments)    Migraine, dizzy, ringing in ears-can take it IV 12/02/2016 interview by [name]: Oral prednisone dosing causes migraines/nausea/tinnitus. Prev tolerated IV and intranasal admin w/o difficulty.   Promethazine Other (See Comments)    causes severe abdominal pain when taken IV; however Megan Brewer can take PO or IM   Stadol [Butorphanol]     Cerebral pain   Erythromycin Base Diarrhea and Itching    Severe diarrhea   Propoxyphene Nausea Only    Past Medical History:  Diagnosis Date   Anemia    Arachnoiditis    Bipolar disorder (HCC)    C. 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   Please see review of systems for further details on the patient's review from today.   Objective:    Physical Exam:  There were no  vitals taken for this visit.  Physical Exam Constitutional:      General: Megan Brewer is not in acute distress. Musculoskeletal:        General: No deformity.  Neurological:     Mental Status: Megan Brewer is alert and oriented to person, place, and time.     Coordination: Coordination normal.  Psychiatric:        Attention and Perception: Attention and perception normal. Megan Brewer does not perceive auditory or visual hallucinations.        Mood and Affect: Mood normal. Mood is not anxious or depressed. Affect is not labile, blunt, angry or inappropriate.        Speech: Speech normal.        Behavior: Behavior normal.        Thought Content: Thought content normal. Thought content is not paranoid or delusional. Thought content does not include homicidal or suicidal ideation. Thought content does not include homicidal or suicidal plan.        Cognition and Memory: Cognition and memory normal.        Judgment: Judgment normal.     Comments: Insight intact    Lab Review:     Component Value Date/Time   NA 138 02/18/2021 1035   K 4.4 02/18/2021 1035   CL 98 02/18/2021 1035   CO2 26 02/18/2021 1035   GLUCOSE 80 02/18/2021 1035   BUN 29 (H) 02/18/2021 1035   CREATININE 0.97 (H) 02/18/2021 1035   CALCIUM 10.0 02/18/2021 1035   PROT 6.5 02/05/2021 0310   ALBUMIN 2.9 (L) 02/05/2021 0310   AST 36 02/05/2021 0310   ALT 31 02/05/2021 0310   ALKPHOS 66 02/05/2021 0310   BILITOT 0.4 02/05/2021 0310   GFRNONAA >60 02/12/2021 0515   GFRNONAA 33 (L) 06/10/2020 1549   GFRAA 38 (L) 06/10/2020 1549       Component Value Date/Time   WBC 8.8 02/18/2021 1035   RBC 4.48 02/18/2021 1035   HGB 13.5 02/18/2021 1035   HCT 40.5 02/18/2021 1035   PLT 271 02/18/2021 1035   MCV 90.4 02/18/2021 1035   MCH 30.1 02/18/2021 1035   MCHC 33.3 02/18/2021 1035   RDW 13.3 02/18/2021 1035   LYMPHSABS 1.1 02/04/2021 1711   MONOABS 0.6 02/04/2021 1711   EOSABS 0.0 02/04/2021 1711   BASOSABS  0.1 02/04/2021 1711    No results found for: POCLITH, LITHIUM   No results found for: PHENYTOIN, PHENOBARB, VALPROATE, CBMZ   .res Assessment: Plan:   Megan Brewer seen for 30 minutes and time spent seeing patient and reviewing record. Megan Brewer reports that mood and anxiety s/s have been improved with current medications throughout recent health issues. Megan Brewer reports that Megan Brewer would like to continue current medications without changes.  Continue Lamictal 200 mg po qd for mood s/s. Continue Remeron 30 mg po QHS for mood, anxiety, and insomnia.  Continue Hydroxyzine 25 mg po BID prn anxiety.  Megan Brewer to 3 months or sooner if clinically indicated.  Patient advised to contact office with any questions, adverse effects, or acute worsening in signs and symptoms.   Megan Brewer was seen today for follow-up.  Diagnoses and all orders for this visit:  Insomnia due to other mental disorder -     mirtazapine (REMERON) 30 MG tablet; Take 1 tablet (30 mg total) by mouth at bedtime. -     hydrOXYzine (VISTARIL) 25 MG capsule; Take 1 capsule (25 mg total) by mouth 2 (two) times daily as needed for anxiety.  Anxiety disorder,  unspecified type -     mirtazapine (REMERON) 30 MG tablet; Take 1 tablet (30 mg total) by mouth at bedtime. -     hydrOXYzine (VISTARIL) 25 MG capsule; Take 1 capsule (25 mg total) by mouth 2 (two) times daily as needed for anxiety.  Bipolar disorder, current episode mixed, moderate (HCC) -     lamoTRIgine (LAMICTAL) 200 MG tablet; Take 1 tablet (200 mg total) by mouth at bedtime.    Please see After Visit Summary for patient specific instructions.  Future Appointments  Date Time Provider [place]  03/03/2021 10:30 AM Mignon Pine, DO [facility]  04/16/2021  1:45 PM [facility] NURSE [facility] None  04/24/2021 11:15 AM Warren Lacy, PA-C [facility]  05/28/2021 11:30 AM Thayer Headings, PMHNP [facility] None  09/30/2021 11:00 AM [name], NP [facility] None    No orders  of the defined types were placed in this encounter.   -------------------------------

## 2021-02-26 ENCOUNTER — Telehealth: Payer: Self-pay | Admitting: Cardiology

## 2021-02-26 NOTE — Telephone Encounter (Signed)
Pt c/o BP issue: STAT if pt c/o blurred vision, one-sided weakness or slurred speech  1. What are your last 5 BP readings? She   2. Are you having any other symptoms (ex. Dizziness, headache, blurred vision, passed out)? Dizziness headache  3. What is your BP issue? Pt   Pt c/o of Chest Pain: STAT if CP now or developed within 24 hours  1. Are you having CP right now? Yes  2. Are you experiencing any other symptoms (ex. SOB, nausea, vomiting, sweating)? SOB  3. How long have you been experiencing CP? Since her appt   4. Is your CP continuous or coming and going? continous  5. Have you taken Nitroglycerin? NO STAT if patient feels like he/she is going to faint   Are you dizzy now?   Do you feel faint or have you passed out?  Do you have any other symptoms?   Have you checked your HR and BP (record if available)?  ?

## 2021-02-26 NOTE — Telephone Encounter (Signed)
Received call from patient complaining of multiple issues, reports "having a hard time".   She states her BP has been elevated (140-150s/90s), she has chest "pounding" and head "pounding".   She states this morning at 7AM BP was 144/90s.  She reports she had to lie down for 1-2 hours for the chest pounding and head pounding to resolve.  She is now sitting in her chair, has been sitting for 30 minutes and is still experiencing symptoms.  Reports SOB with exertion.   Had patient check BP while on the phone, BP 100/47, HR 76.  She states he chest feels like a "drum" and her head "feels like a beach ball", patient unable to describe this further.   Also reports dizziness.  She states these symptoms are new.    OV 7/7 with Dr. Jobie Quaker decreased to 2.5.  +orthos Advised patient unsure all of her symptoms are related to her blood pressure as BP now is ok, dizziness could be related.  Advised if these symptoms are new or acute, would proceed to ER for evaluation.   Patient verbalized understanding.

## 2021-03-03 ENCOUNTER — Ambulatory Visit: Payer: Medicare PPO | Admitting: Internal Medicine

## 2021-03-03 ENCOUNTER — Other Ambulatory Visit: Payer: Self-pay

## 2021-03-03 ENCOUNTER — Encounter: Payer: Self-pay | Admitting: Internal Medicine

## 2021-03-03 VITALS — BP 97/64 | HR 91 | Temp 97.3°F

## 2021-03-03 DIAGNOSIS — A0472 Enterocolitis due to Clostridium difficile, not specified as recurrent: Secondary | ICD-10-CM | POA: Diagnosis not present

## 2021-03-03 DIAGNOSIS — R7881 Bacteremia: Secondary | ICD-10-CM

## 2021-03-03 LAB — AEROBIC BACTERIA, ID BY SEQ.

## 2021-03-03 NOTE — Assessment & Plan Note (Signed)
She completed 7 days of meropenem s/p line removal and has no signs or symptoms of recurrent sepsis.

## 2021-03-03 NOTE — Progress Notes (Signed)
Megan Brewer for Infectious Disease  Reason for Consult: Hospital follow up for GNR bacteremia  Referring Provider: Dr Baxter Flattery   HPI:    Megan Brewer is a 75 y.o. female with PMHx as below who presents to the clinic for GNR bacteremia and C diff.    Previously seen by Dr Baxter Flattery who directly admitted patient to the hospital during last clinic visit on 02/04/21 for concern related to persistent Gram negative bacteremia and C diff.  Her port a cath cultures grew a GNR but her peripheral cultures were negative.  She was treated with Meropenem for 7 days post line removal.  She is currently on a prolonged vancomycin taper for second episode of C diff. She reports overall still feeling tired and fatigued but overall improved.  She was seen by Dr West Bali during her last admission who recommended a prolonged vancomycin taper.   She reports yesterday having a large volume diarrhea but will have days where she feels improvement.  She is currently on Vancomycin 125mg  every other day for 2 weeks which she started last week.   Patient's Medications  New Prescriptions   No medications on file  Previous Medications   ACETAMINOPHEN (TYLENOL) 500 MG TABLET    Take 1-2 tablets (500-1,000 mg total) by mouth every 8 (eight) hours as needed (Max dose 3000 mg in 24 hours).   AMLODIPINE (NORVASC) 2.5 MG TABLET    Take 1 tablet (2.5 mg total) by mouth daily.   AZELASTINE-FLUTICASONE 137-50 MCG/ACT SUSP    ONE SPRAY EACH NOSTRIL TWICE A DAY AS NEEDED   BACLOFEN (LIORESAL) 10 MG TABLET    Take 10 mg by mouth daily as needed for muscle spasms.   CALCIUM CITRATE 200 MG TABS    Take 200 mg of elemental calcium by mouth daily.   CHOLECALCIFEROL (VITAMIN D3) 25 MCG (1000 UNIT) TABLET    Take 1,000 Units by mouth daily.   CYCLOSPORINE (RESTASIS) 0.05 % OPHTHALMIC EMULSION    Place 1 drop into both eyes 2 (two) times daily.   DEXLANSOPRAZOLE (DEXILANT) 60 MG CAPSULE    Take 1 capsule (60 mg total) by mouth  daily. Crack open the capsule and pour into spoon prior to ingestion.   ERENUMAB-AOOE (AIMOVIG, 140 MG DOSE,) 70 MG/ML SOAJ    Inject 70 mg into the skin every 30 (thirty) days.   ESTRADIOL (ESTRACE) 0.1 MG/GM VAGINAL CREAM    Place 1 Applicatorful vaginally 3 (three) times a week.   FERROUS SULFATE 325 (65 FE) MG TABLET    Take 650 mg by mouth daily with breakfast.   HYDROXYZINE (VISTARIL) 25 MG CAPSULE    Take 1 capsule (25 mg total) by mouth 2 (two) times daily as needed for anxiety.   IBUPROFEN (ADVIL) 200 MG TABLET    Take 400 mg by mouth every 6 (six) hours as needed for mild pain.   IMMUNE GLOBULIN, HUMAN, (GAMMAGARD S/D LESS IGA) 10 G INJECTION    Inject 2 g/kg into the vein every 21 ( twenty-one) days.   LAMOTRIGINE (LAMICTAL) 200 MG TABLET    Take 1 tablet (200 mg total) by mouth at bedtime.   LASTACAFT 0.25 % SOLN    Place 1 drop into both eyes daily.   LEVOTHYROXINE (SYNTHROID) 75 MCG TABLET    Take one tablet by mouth once daily 30 minutes before breakfast on empty stomach.   LOPERAMIDE (IMODIUM A-D) 2 MG TABLET    Take 2 mg by  mouth 4 (four) times daily as needed for diarrhea or loose stools.   MAGNESIUM OXIDE (MAG-OX) 400 MG TABLET    Take 400 mg by mouth daily.    MIRTAZAPINE (REMERON) 30 MG TABLET    Take 1 tablet (30 mg total) by mouth at bedtime.   OXYBUTYNIN (DITROPAN) 5 MG/5ML SYRUP    Take 2.5 mLs (2.5 mg total) by mouth 2 (two) times daily.   POTASSIUM CHLORIDE (KLOR-CON 10) 10 MEQ TABLET    Take 1 tablet (10 mEq total) by mouth 4 (four) times daily.   ROPINIROLE (REQUIP) 1 MG TABLET    TAKE 1 TABLET BY MOUTH AT BEDTIME   SACCHAROMYCES BOULARDII (FLORASTOR) 250 MG CAPSULE    Take 1 capsule (250 mg total) by mouth 2 (two) times daily.   SUCRALFATE (CARAFATE) 1 GM/10ML SUSPENSION    Take 20 mLs (2 g total) by mouth 3 (three) times daily as needed (gerd).   TAMSULOSIN (FLOMAX) 0.4 MG CAPS CAPSULE    Take 1 capsule (0.4 mg total) by mouth daily.   TIZANIDINE (ZANAFLEX) 4 MG  TABLET    Take 1 tablet (4 mg total) by mouth every 6 (six) hours as needed for muscle spasms.   TORSEMIDE (DEMADEX) 20 MG TABLET    TAKE 1 TABLET BY MOUTH DAILY   TRIAMCINOLONE CREAM (KENALOG) 0.1 %    Apply 1 application topically 2 (two) times daily as needed (rash).   UNABLE TO FIND    Take 1 tablet by mouth daily. Med Name: [name] 1 by mouth daily for UTI prevention   VANCOMYCIN (VANCOCIN) 125 MG CAPSULE    Take 1 capsule (125 mg total) by mouth daily for 7 days.   VANCOMYCIN (VANCOCIN) 125 MG CAPSULE    Take 1 capsule (125 mg total) by mouth every other day for 14 days.   VITAMIN B-12 (CYANOCOBALAMIN) 1000 MCG TABLET    Take 1 tablet (1,000 mcg total) by mouth daily.   ZINC OXIDE, TOPICAL, 10 % CREA    Apply 1 application topically 2 (two) times daily as needed (rash).  Modified Medications   No medications on file  Discontinued Medications   No medications on file      Past Medical History:  Diagnosis Date   Anemia    Arachnoiditis    Bipolar disorder (HCC)    C. 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[place])    Lung cancer ([place]) 2011, 2014   Memory loss    Migraine    Opioid abuse ([place])    Osteoporosis    Presence of neurostimulator    Restless legs    SIRS (systemic inflammatory response syndrome) ([place])    Sleep apnea     Social History   Tobacco Use   Smoking status: Never   Smokeless tobacco: Never  Vaping Use   Vaping Use: Never used  Substance Use Topics   Alcohol use: Not Currently   Drug use: Never    Family History  Problem Relation Age of Onset   Hypertension Mother    GER disease Mother    Dementia Mother    Osteoporosis Mother    Arthritis Mother    Bipolar disorder Mother  Parkinson's disease  Father    Congestive Heart Failure Father    Pneumonia Father    Arthritis Father    Dementia Father    Depression Father    Asthma Sister    Depression Sister    Bipolar disorder Brother    Asthma Daughter    Colon cancer Neg Hx    Esophageal cancer Neg Hx    Stomach cancer Neg Hx    Rectal cancer Neg Hx     Allergies  Allergen Reactions   Imitrex [Sumatriptan] Nausea And Vomiting    Ringing in the ears, migraines are worse   Tetracycline Nausea Only    Causes ringing in ears, dizziness, migraine, nausea   Corticosteroids Other (See Comments)    12/02/2016 interview by XHB: Oral dosing causes migraines/nausea/tinnitus. Prev tolerated IV and intranasal admin w/o difficulty.   Cefixime Other (See Comments)    Headache    Ciprofloxacin Other (See Comments)    Headache, dizziness, ringing in the ears   Doxycycline Other (See Comments)    unknown   Gabapentin     Throat swells/closes   Isometheptene-Dichloral-Apap Other (See Comments)    unknown   Ketorolac     12/02/2016 interview by ZJI: Oral dosing prednisone/corticosteroids causes migraines/nausea/tinnitus. Prev tolerated IV and intranasal admin w/o difficulty.   Prednisone Other (See Comments)    Migraine, dizzy, ringing in ears-can take it IV 12/02/2016 interview by [name]: Oral prednisone dosing causes migraines/nausea/tinnitus. Prev tolerated IV and intranasal admin w/o difficulty.   Promethazine Other (See Comments)    causes severe abdominal pain when taken IV; however she can take PO or IM   Stadol [Butorphanol]     Cerebral pain   Erythromycin Base Diarrhea and Itching    Severe diarrhea   Propoxyphene Nausea Only    Review of Systems  Constitutional: Negative.   Respiratory: Negative.    Cardiovascular: Negative.   Gastrointestinal:  Positive for diarrhea. Negative for abdominal pain, constipation, nausea and vomiting.     OBJECTIVE:    Vitals:   03/03/21 1037  BP: 97/64  Pulse: 91  Temp: (!) 97.3  F (36.3 C)  TempSrc: Oral     There is no height or weight on file to calculate BMI.  Physical Exam Constitutional:      General: She is not in acute distress.    Appearance: Normal appearance.  HENT:     Head: Normocephalic and atraumatic.  Pulmonary:     Effort: Pulmonary effort is normal. No respiratory distress.  Abdominal:     General: There is no distension.     Palpations: Abdomen is soft.     Tenderness: There is no abdominal tenderness.  Neurological:     General: No focal deficit present.     Mental Status: She is alert and oriented to person, place, and time.  Psychiatric:        Mood and Affect: Mood normal.        Behavior: Behavior normal.     Labs and Microbiology:  CBC Latest Ref Rng & Units 02/18/2021 02/12/2021 02/11/2021  WBC 3.8 - 10.8 Thousand/uL 8.8 3.4(L) 4.0  Hemoglobin 11.7 - 15.5 g/dL 13.5 10.9(L) 10.9(L)  Hematocrit 35.0 - 45.0 % 40.5 34.3(L) 34.3(L)  Platelets 140 - 400 Thousand/uL 271 146(L) 143(L)   CMP Latest Ref Rng & Units 02/18/2021 02/12/2021 02/11/2021  Glucose 65 - 139 mg/dL 80 84 84  BUN 7 - 25 mg/dL 29(H) 17 14  Creatinine 0.60 - 0.93 mg/dL  0.97(H) 0.95 0.75  Sodium 135 - 146 mmol/L 138 136 138  Potassium 3.5 - 5.3 mmol/L 4.4 4.4 4.5  Chloride 98 - 110 mmol/L 98 105 109  CO2 20 - 32 mmol/L 26 24 24   Calcium 8.6 - 10.4 mg/dL 10.0 8.6(L) 8.6(L)  Total Protein 6.5 - 8.1 g/dL - - -  Total Bilirubin 0.3 - 1.2 mg/dL - - -  Alkaline Phos 38 - 126 U/L - - -  AST 15 - 41 U/L - - -  ALT 0 - 44 U/L - - -      ASSESSMENT & PLAN:    C. difficile diarrhea She is currently on vancomycin prolonged taper.  She has about 2 weeks left of every other day dosing.  Reports ongoing diarrhea that is intermittent but no fevers or abdominal pain.  Discussed possibility of post-IBS C diff as well if symptoms remain persistent.  She will complete therapy and follow up in 4 weeks with Dr West Bali to see how she is doing after treatment.  If she develops  another episode of C diff could consider Dificid +/- Zinplava infusion.    Gram-negative bacteremia She completed 7 days of meropenem s/p line removal and has no signs or symptoms of recurrent sepsis.    No orders of the defined types were placed in this encounter.     Raynelle Highland for Infectious Disease [facility] 03/03/2021, 11:03 AM

## 2021-03-03 NOTE — Assessment & Plan Note (Signed)
She is currently on vancomycin prolonged taper.  She has about 2 weeks left of every other day dosing.  Reports ongoing diarrhea that is intermittent but no fevers or abdominal pain.  Discussed possibility of post-IBS C diff as well if symptoms remain persistent.  She will complete therapy and follow up in 4 weeks with Dr West Bali to see how she is doing after treatment.  If she develops another episode of C diff could consider Dificid +/- Zinplava infusion.

## 2021-03-03 NOTE — Patient Instructions (Signed)
Thank you for coming to see me today. It was a pleasure seeing you.  To Do: Continue your vancomycin taper as outlined by Dr West Bali After completion, monitor for worsening diarrhea, abdominal pain, fevers Follow up with Dr Jerilynn Mages as scheduled  If you have any questions or concerns, please do not hesitate to call the office at [phone].  Take Care,   Jule Ser, DO

## 2021-03-04 LAB — SUSCEPTIBILITY RESULT

## 2021-03-04 LAB — CULTURE, BLOOD (ROUTINE X 2)
Special Requests: ADEQUATE
Special Requests: ADEQUATE

## 2021-03-04 LAB — CULTURE, BLOOD (SINGLE): Special Requests: ADEQUATE

## 2021-03-04 LAB — SUSCEPTIBILITY, AER + ANAEROB

## 2021-03-06 ENCOUNTER — Other Ambulatory Visit: Payer: Self-pay

## 2021-03-06 ENCOUNTER — Ambulatory Visit: Payer: Medicare PPO | Admitting: Neurology

## 2021-03-06 VITALS — BP 106/65 | HR 82 | Resp 18 | Ht 62.0 in | Wt 121.0 lb

## 2021-03-06 DIAGNOSIS — G2581 Restless legs syndrome: Secondary | ICD-10-CM

## 2021-03-06 DIAGNOSIS — G250 Essential tremor: Secondary | ICD-10-CM | POA: Diagnosis not present

## 2021-03-06 MED ORDER — ROPINIROLE HCL 1 MG PO TABS: ORAL_TABLET | ORAL | 3 refills | Status: DC

## 2021-03-06 MED ORDER — CARBIDOPA-LEVODOPA 25-100 MG PO TABS: ORAL_TABLET | ORAL | 2 refills | Status: DC

## 2021-03-06 NOTE — Progress Notes (Signed)
Follow-up Visit   Date: 03/06/21   Megan Brewer MRN: [id number] DOB: 30-Jan-1946   Interim History: Megan Brewer is a 75 y.o. right handed female with lung cancer, chronic back pain s/p spinal cord stimulator (followed by pain management), depression, GERD, CKD, immunoglobulin deficiency, and hypothyroidism  returning to the clinic for follow-up of restless leg syndrome and new complaints of tremors.  The patient was accompanied to the clinic by self.  History of present illness: She reports having restless leg syndrome for about 20 years and has been on ropinirole 1mg  at bedtime which controls her symptoms, however, when she transferred care locally, she was instructed to come off her ropinirole and reduced it to 0.5mg  at bedtime.  Her RLS particularly became worse in February. Because this dose did not control her discomfort, she self-titrated ropinirole to 1mg  at bedtime with tablets that she had from prior refill.  When she takes ropinirole 1mg  at bedtime, she sleeps well and RLS does not bother her.  Of note, she was found to have severe iron deficiency anemia (ferritin 4.9) and is on iron supplements.   UPDATE 03/06/2021:  She is here for follow-up, last seen in March 2021 at which time she was continued on ropinirole 1mg  at bedtime and ferritin was found to be very low.  She has been on iron and her last ferritin is up to 42.  She has several hospitalizations this year, most recently for c. difficile and sepsis.  She tells me that her RLS is well-controlled on ropinirole 1mg  at bedtime about 3-4 nights of the week, but the remaining night, she may wake up within an hour having to take another 1mg , and rarely will take up to 3mg .    She also complains of generalized tremors of the arms which has been long standing.  Her father had Parksinon's and she is concerned about this.  Her tremors are worse with action, such as when doing tasks.  She denies resting tremor. She denies  stiffness.  She has some imbalance, but walks unassisted and has not suffered any falls.   Medications:  Current Outpatient Medications on File Prior to Visit  Medication Sig Dispense Refill   acetaminophen (TYLENOL) 500 MG tablet Take 1-2 tablets (500-1,000 mg total) by mouth every 8 (eight) hours as needed (Max dose 3000 mg in 24 hours).     amLODipine (NORVASC) 2.5 MG tablet Take 1 tablet (2.5 mg total) by mouth daily. 90 tablet 1   Azelastine-Fluticasone 137-50 MCG/ACT SUSP ONE SPRAY EACH NOSTRIL TWICE A DAY AS NEEDED 23 g 0   baclofen (LIORESAL) 10 MG tablet Take 10 mg by mouth daily as needed for muscle spasms.     Calcium Citrate 200 MG TABS Take 200 mg of elemental calcium by mouth daily.     cholecalciferol (VITAMIN D3) 25 MCG (1000 UNIT) tablet Take 1,000 Units by mouth daily.     cycloSPORINE (RESTASIS) 0.05 % ophthalmic emulsion Place 1 drop into both eyes 2 (two) times daily.     dexlansoprazole (DEXILANT) 60 MG capsule Take 1 capsule (60 mg total) by mouth daily. Crack open the capsule and pour into spoon prior to ingestion. 30 capsule 11   Erenumab-aooe (AIMOVIG, 140 MG DOSE,) 70 MG/ML SOAJ Inject 70 mg into the skin every 30 (thirty) days.     estradiol (ESTRACE) 0.1 MG/GM vaginal cream Place 1 Applicatorful vaginally 3 (three) times a week.     ferrous sulfate 325 (65 FE) MG  tablet Take 650 mg by mouth daily with breakfast.     hydrOXYzine (VISTARIL) 25 MG capsule Take 1 capsule (25 mg total) by mouth 2 (two) times daily as needed for anxiety. 30 capsule 5   ibuprofen (ADVIL) 200 MG tablet Take 400 mg by mouth every 6 (six) hours as needed for mild pain.     immune globulin, human, (GAMMAGARD S/D LESS IGA) 10 g injection Inject 2 g/kg into the vein every 21 ( twenty-one) days.     lamoTRIgine (LAMICTAL) 200 MG tablet Take 1 tablet (200 mg total) by mouth at bedtime. 90 tablet 1   LASTACAFT 0.25 % SOLN Place 1 drop into both eyes daily.     levothyroxine (SYNTHROID) 75 MCG tablet  Take one tablet by mouth once daily 30 minutes before breakfast on empty stomach. 90 tablet 1   loperamide (IMODIUM A-D) 2 MG tablet Take 2 mg by mouth 4 (four) times daily as needed for diarrhea or loose stools.     magnesium oxide (MAG-OX) 400 MG tablet Take 400 mg by mouth daily.      mirtazapine (REMERON) 30 MG tablet Take 1 tablet (30 mg total) by mouth at bedtime. 90 tablet 1   oxybutynin (DITROPAN) 5 MG/5ML syrup Take 2.5 mLs (2.5 mg total) by mouth 2 (two) times daily. 473 mL 1   potassium chloride (KLOR-CON 10) 10 MEQ tablet Take 1 tablet (10 mEq total) by mouth 4 (four) times daily. 120 tablet 1   rOPINIRole (REQUIP) 1 MG tablet TAKE 1 TABLET BY MOUTH AT BEDTIME 90 tablet 0   saccharomyces boulardii (FLORASTOR) 250 MG capsule Take 1 capsule (250 mg total) by mouth 2 (two) times daily. 30 capsule 3   sucralfate (CARAFATE) 1 GM/10ML suspension Take 20 mLs (2 g total) by mouth 3 (three) times daily as needed (gerd).     tamsulosin (FLOMAX) 0.4 MG CAPS capsule Take 1 capsule (0.4 mg total) by mouth daily. 30 capsule 1   tiZANidine (ZANAFLEX) 4 MG tablet Take 1 tablet (4 mg total) by mouth every 6 (six) hours as needed for muscle spasms. 10 tablet 0   torsemide (DEMADEX) 20 MG tablet TAKE 1 TABLET BY MOUTH DAILY 90 tablet 1   triamcinolone cream (KENALOG) 0.1 % Apply 1 application topically 2 (two) times daily as needed (rash).     UNABLE TO FIND Take 1 tablet by mouth daily. Med Name: [name] 1 by mouth daily for UTI prevention     vancomycin (VANCOCIN) 125 MG capsule Take 1 capsule (125 mg total) by mouth every other day for 14 days. 7 capsule 0   vitamin B-12 (CYANOCOBALAMIN) 1000 MCG tablet Take 1 tablet (1,000 mcg total) by mouth daily. 30 tablet 0   ZINC OXIDE, TOPICAL, 10 % CREA Apply 1 application topically 2 (two) times daily as needed (rash).     No current facility-administered medications on file prior to visit.    Allergies:  Allergies  Allergen Reactions   Imitrex  [Sumatriptan] Nausea And Vomiting    Ringing in the ears, migraines are worse   Tetracycline Nausea Only    Causes ringing in ears, dizziness, migraine, nausea   Corticosteroids Other (See Comments)    12/02/2016 interview by GMW: Oral dosing causes migraines/nausea/tinnitus. Prev tolerated IV and intranasal admin w/o difficulty.   Cefixime Other (See Comments)    Headache    Ciprofloxacin Other (See Comments)    Headache, dizziness, ringing in the ears   Doxycycline Other (See Comments)  unknown   Gabapentin     Throat swells/closes   Isometheptene-Dichloral-Apap Other (See Comments)    unknown   Ketorolac     12/02/2016 interview by FUX: Oral dosing prednisone/corticosteroids causes migraines/nausea/tinnitus. Prev tolerated IV and intranasal admin w/o difficulty.   Prednisone Other (See Comments)    Migraine, dizzy, ringing in ears-can take it IV 12/02/2016 interview by [name]: Oral prednisone dosing causes migraines/nausea/tinnitus. Prev tolerated IV and intranasal admin w/o difficulty.   Promethazine Other (See Comments)    causes severe abdominal pain when taken IV; however she can take PO or IM   Stadol [Butorphanol]     Cerebral pain   Erythromycin Base Diarrhea and Itching    Severe diarrhea   Propoxyphene Nausea Only    Vital Signs:  BP 106/65   Pulse 82   Resp 18   Ht 5\' 2"  (1.575 m)   Wt 121 lb (54.9 kg)   SpO2 96%   BMI 22.13 kg/m   Neurological Exam: MENTAL STATUS including orientation to time, place, person, recent and remote memory, attention span and concentration, language, and fund of knowledge is normal.  Speech is not dysarthric.  CRANIAL NERVES:  No visual field defects.  Pupils equal round and reactive to light.  Normal conjugate, extra-ocular eye movements in all directions of gaze.  No ptosis.    MOTOR:  Motor strength is 5/5 in all extremities.  No atrophy, fasciculations or abnormal movements.  No pronator drift.  Tone is normal.    MSRs:   Reflexes are 2+/4 throughout.  SENSORY:  Intact to vibration throughout.  COORDINATION/GAIT:  Normal finger-to- nose-finger.  Intact rapid alternating movements bilaterally.  Gait narrow based and stable.   Data: Lab Results  Component Value Date   FERRITIN 42 02/06/2021     IMPRESSION/PLAN: Restless leg syndrome, worse.   - Increase ropinirole to 1.5mg  take 2-3 hours before bedtime  - For breakthrough discomfort, start sinemet 25/100 prn  - Continue iron supplement  Benign tremors  - Pt reassured that I did not see signs of Parkinson's  - If this progresses or her exam changes, DAT scan can be ordered   Return to clinic in 4 months.    Thank you for allowing me to participate in patient's care.  If I can answer any additional questions, I would be pleased to do so.    Sincerely,    Eisa Conaway K. Posey Pronto, DO

## 2021-03-06 NOTE — Patient Instructions (Signed)
Increase ropinirole to 1.5mg , take 2-3 hours before bedtime  For severe restless leg symptoms, take sinemet 1 tablet as needed.  Return to clinic in 4 months

## 2021-03-07 ENCOUNTER — Telehealth: Payer: Self-pay | Admitting: Family Medicine

## 2021-03-07 ENCOUNTER — Other Ambulatory Visit: Payer: Self-pay

## 2021-03-07 MED ORDER — AZELASTINE HCL 0.15 % NA SOLN: 2.0000 | Freq: Two times a day (BID) | NASAL | 0 refills | Status: DC

## 2021-03-07 NOTE — Telephone Encounter (Signed)
A 30 day courtesy refill of the azelastine nasal spray has been sent into [facility] - [address], [place] [address]. Patient has been notified.

## 2021-03-07 NOTE — Telephone Encounter (Signed)
A 30 day courtesy refill of the azelastine nasal spray has been sent into [facility] - [address], [place] [address]. Patient has been notified.

## 2021-03-07 NOTE — Telephone Encounter (Signed)
Patient called requesting a refilll for azelastine. Patient did make an appointment, 03-17-2021 @ 5pm with Chrissie in the [place] office. Patient is requesting a courtesy refill until she is able to come in.   [address], [place] [address]  Patient is requesting a phone call when medication is sent in.

## 2021-03-10 ENCOUNTER — Encounter: Payer: Self-pay | Admitting: Family Medicine

## 2021-03-10 ENCOUNTER — Ambulatory Visit: Payer: Medicare PPO | Admitting: Family Medicine

## 2021-03-10 ENCOUNTER — Other Ambulatory Visit: Payer: Self-pay

## 2021-03-10 VITALS — BP 106/70 | HR 84 | Temp 97.4°F | Resp 24 | Ht 62.0 in | Wt 119.0 lb

## 2021-03-10 DIAGNOSIS — D803 Selective deficiency of immunoglobulin G [IgG] subclasses: Secondary | ICD-10-CM | POA: Diagnosis not present

## 2021-03-10 DIAGNOSIS — J320 Chronic maxillary sinusitis: Secondary | ICD-10-CM

## 2021-03-10 MED ORDER — AZELASTINE HCL 0.1 % NA SOLN: 2.0000 | Freq: Two times a day (BID) | NASAL | 5 refills | Status: DC

## 2021-03-10 NOTE — Patient Instructions (Addendum)
Chronic rhinitis Continue azelastine 2 sprays in each nostril twice a day as needed for a runny nose Continue Flonase 2 sprays in each nostril once a day as needed for a stuffy nose Begin saline nasal rinses as needed for nasal symptoms. Use this before any medicated nasal sprays for best result  IgG deficiency Continue infusions as prescribed by Dr. Marcelline Deist. Keep track of infections  Call the clinic if this treatment plan is not working well for you  Follow up in 1 year or sooner if needed.

## 2021-03-10 NOTE — Progress Notes (Signed)
[address] [place] [address] Dept: [phone]  FOLLOW UP NOTE  Patient ID: Megan Brewer, female    DOB: 1946-03-22  Age: 75 y.o. MRN: [id number] Date of Office Visit: 03/10/2021  Assessment  Chief Complaint: Sinus Problem  HPI Megan Brewer is a 75 year old female who presents the clinic for follow-up visit.  She was last seen in this clinic on 07/17/2020 for evaluation of acute sinusitis requiring an antibiotic for resolution, chronic rhinitis, and IgG deficiency for whom she sees Dr. Marcelline Deist who manages her IgG infusions. In the interim, she was admitted to [facility] on 01/14/2021 for SIRS. At today's visit, she reports her chronic rhinitis has been moderately well controlled with symptoms including clear rhinorrhea, sneeze, and post nasal drainage while lying down. She continues azelastine nasal spray as needed with relief of nasal drainage, sneezing, and headache. She continues IVIG infusion therapy with Gammagard for severs combined immunodeficiency once every 3 weeks with at home infusion managed by Dr. Marcelline Deist. Her current medications are listed in the chart.    Drug Allergies:  Allergies  Allergen Reactions   Imitrex [Sumatriptan] Nausea And Vomiting    Ringing in the ears, migraines are worse   Tetracycline Nausea Only    Causes ringing in ears, dizziness, migraine, nausea   Corticosteroids Other (See Comments)    12/02/2016 interview by HFW: Oral dosing causes migraines/nausea/tinnitus. Prev tolerated IV and intranasal admin w/o difficulty.   Cefixime Other (See Comments)    Headache    Ciprofloxacin Other (See Comments)    Headache, dizziness, ringing in the ears   Doxycycline Other (See Comments)    unknown   Gabapentin     Throat swells/closes   Isometheptene-Dichloral-Apap Other (See Comments)    unknown   Ketorolac     12/02/2016 interview by YOV: Oral dosing prednisone/corticosteroids causes migraines/nausea/tinnitus. Prev tolerated IV  and intranasal admin w/o difficulty.   Prednisone Other (See Comments)    Migraine, dizzy, ringing in ears-can take it IV 12/02/2016 interview by [name]: Oral prednisone dosing causes migraines/nausea/tinnitus. Prev tolerated IV and intranasal admin w/o difficulty.   Promethazine Other (See Comments)    causes severe abdominal pain when taken IV; however she can take PO or IM   Stadol [Butorphanol]     Cerebral pain   Erythromycin Base Diarrhea and Itching    Severe diarrhea   Propoxyphene Nausea Only    Physical Exam: BP 106/70 (BP Location: Left Arm, Patient Position: Sitting, Cuff Size: Small)   Pulse 84   Temp (!) 97.4 F (36.3 C) (Temporal)   Resp (!) 24   Ht 5\' 2"  (1.575 m)   Wt 119 lb (54 kg)   SpO2 100%   BMI 21.77 kg/m    Physical Exam Vitals reviewed.  Constitutional:      Appearance: Normal appearance.  HENT:     Head: Normocephalic and atraumatic.     Right Ear: Tympanic membrane normal.     Left Ear: Tympanic membrane normal.     Nose:     Comments: Bilateral nares slightly erythematous with clear nasal drainage noted. Pharynx normal. Ears normal. Eyes normal.    Mouth/Throat:     Pharynx: Oropharynx is clear.  Eyes:     Conjunctiva/sclera: Conjunctivae normal.  Cardiovascular:     Rate and Rhythm: Normal rate and regular rhythm.     Heart sounds: Normal heart sounds. No murmur heard. Pulmonary:     Effort: Pulmonary effort is normal.  Breath sounds: Normal breath sounds.     Comments: Lungs clear to auscultation Musculoskeletal:        General: Normal range of motion.     Cervical back: Normal range of motion and neck supple.  Skin:    General: Skin is warm and dry.  Neurological:     Mental Status: She is alert and oriented to person, place, and time.  Psychiatric:        Mood and Affect: Mood normal.        Behavior: Behavior normal.        Thought Content: Thought content normal.        Judgment: Judgment normal.    Assessment and Plan: 1.  Chronic sinusitis   2. 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[place])     Meds ordered this encounter  Medications   azelastine (ASTELIN) 0.1 % nasal spray    Sig: Place 2 sprays into both nostrils 2 (two) times daily.    Dispense:  30 mL    Refill:  5     Patient Instructions  Chronic rhinitis Continue azelastine 2 sprays in each nostril twice a day as needed for a runny nose Continue Flonase 2 sprays in each nostril once a day as needed for a stuffy nose Begin saline nasal rinses as needed for nasal symptoms. Use this before any medicated nasal sprays for best result  IgG deficiency Continue infusions as prescribed by Dr. Marcelline Deist. Keep track of infections  Call the clinic if this treatment plan is not working well for you  Follow up in 1 year or sooner if needed.  Return in about 1 year (around 03/10/2022), or if symptoms worsen or fail to improve.    Thank you for the opportunity to care for this patient.  Please do not hesitate to contact me with questions.  Gareth Morgan, FNP [facility] [place] [facility] [place]

## 2021-03-12 ENCOUNTER — Other Ambulatory Visit: Payer: Self-pay | Admitting: Nurse Practitioner

## 2021-03-12 DIAGNOSIS — E039 Hypothyroidism, unspecified: Secondary | ICD-10-CM

## 2021-03-17 ENCOUNTER — Ambulatory Visit: Payer: Medicare PPO | Admitting: Family

## 2021-03-20 LAB — AEROBIC BACTERIA, ID BY SEQ.

## 2021-03-26 DIAGNOSIS — D801 Nonfamilial hypogammaglobulinemia: Secondary | ICD-10-CM | POA: Diagnosis not present

## 2021-03-26 DIAGNOSIS — I951 Orthostatic hypotension: Secondary | ICD-10-CM | POA: Diagnosis not present

## 2021-03-26 DIAGNOSIS — G43909 Migraine, unspecified, not intractable, without status migrainosus: Secondary | ICD-10-CM

## 2021-03-26 DIAGNOSIS — I129 Hypertensive chronic kidney disease with stage 1 through stage 4 chronic kidney disease, or unspecified chronic kidney disease: Secondary | ICD-10-CM | POA: Diagnosis not present

## 2021-03-26 DIAGNOSIS — G894 Chronic pain syndrome: Secondary | ICD-10-CM

## 2021-03-26 DIAGNOSIS — N189 Chronic kidney disease, unspecified: Secondary | ICD-10-CM | POA: Diagnosis not present

## 2021-03-26 DIAGNOSIS — G4733 Obstructive sleep apnea (adult) (pediatric): Secondary | ICD-10-CM

## 2021-03-26 DIAGNOSIS — F319 Bipolar disorder, unspecified: Secondary | ICD-10-CM

## 2021-03-26 DIAGNOSIS — I447 Left bundle-branch block, unspecified: Secondary | ICD-10-CM

## 2021-03-26 DIAGNOSIS — E039 Hypothyroidism, unspecified: Secondary | ICD-10-CM

## 2021-04-01 ENCOUNTER — Ambulatory Visit: Payer: Medicare PPO | Admitting: Infectious Diseases

## 2021-04-01 ENCOUNTER — Encounter: Payer: Self-pay | Admitting: Infectious Diseases

## 2021-04-01 ENCOUNTER — Other Ambulatory Visit: Payer: Self-pay

## 2021-04-01 VITALS — BP 106/67 | HR 80 | Temp 98.4°F | Wt 124.0 lb

## 2021-04-01 DIAGNOSIS — Z5181 Encounter for therapeutic drug level monitoring: Secondary | ICD-10-CM | POA: Diagnosis not present

## 2021-04-01 DIAGNOSIS — A0472 Enterocolitis due to Clostridium difficile, not specified as recurrent: Secondary | ICD-10-CM

## 2021-04-01 NOTE — Progress Notes (Addendum)
[facility]                                                             [place], [place], [place], [address]                                                                  Phn. [phone]; Fax: [phone]                                                                             Date: 04/01/21  Reason for Referral: C diff diarrhea   Assessment Problem List Items Addressed This Visit       Digestive   C. difficile diarrhea - Primary     Other   Medication monitoring encounter    C diff Diarrhea, resolved : her first episode of C diff was when she was in her 30s which was treated with PO Vancomycin  Plan Completed tx of C diff with resolution of Diarrhea Discussed about chances of recurrences and to be judicious with the use of antibiotics. Will likely use Fidaxomicin +/- Zinplava infusion if recurred  Follow up as needed   All questions and concerns were discussed and addressed. Patient verbalized understanding of the plan. ____________________________________________________________________________________________________________________ HPI/Interval Events 75 YOM Female with PMH as below who is here for follow up in the setting of GNR bacteremia and C diff diarrhea. She was discharged from the hospital on 7/1 and had completed 7 days of Meropenem post line removal. She was also started on Prolonged vancomycin course for second episode of C diff. She was seen by my partner DR Juleen China on 7/18 at which time she was taking Vancomycin PO every other day. Her diarrhea was reported to be intermittent at that time. She comes today for a follow up. She has completed 2 weeks of every alternate day treatment with C diff. She says her diarrhea comes back once in 2 weeks or so but currently doe not have diarrhea. She wants to have a new port placed for her injections as she is a hardstick. I  told her she can have a new port placed if required from an ID  standpoint as she has been treated for her prior GNR infection.   Denies fevers, chills and sweats. Denies nausea, vomiting, abdominal pain. Her appetite is better and she is eating well. She thinks she might have gained some weight. Discussed with her about judicious use of abtx and chances of recurrence of C diff.   ROS: Constitutional: Negative for fever, chills, activity change, appetite change, fatigue and unexpected weight change.  Respiratory: Negative for cough, shortness of breath Cardiovascular: Negative for chest pain, palpitations and leg swelling.  Gastrointestinal: Negative for nausea, vomiting, abdominal pain, constipation, .  Genitourinary: Negative for dysuria, hematuria, flank pain Musculoskeletal: Negative for myalgias, arthralgia, back pain, joint swelling, arthralgias Skin: Negative for rashes, lesions  Neurological: Negative for weakness, dizziness or headache  Past Medical History:  Diagnosis Date   Anemia    Arachnoiditis    Bipolar disorder (HCC)    C. 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[facility])    Lung cancer ([facility]) 2011, 2014   Memory loss    Migraine    Opioid abuse ([facility])    Osteoporosis    Presence of neurostimulator    Restless legs    SIRS (systemic inflammatory response syndrome) ([facility])    Sleep apnea    Past Surgical History:  Procedure Laterality Date   ABDOMINAL HYSTERECTOMY     CARPAL TUNNEL RELEASE     CATARACT EXTRACTION Bilateral 2019   CHOLECYSTECTOMY     COLONOSCOPY  2015   [facility]   CT LUNG SCREENING  2018   DG  BONE DENSITY ([facility])  2018   DIAGNOSTIC MAMMOGRAM  2019   GASTRIC BYPASS     LUNG CANCER SURGERY  02/2010    MULTIPLE TOOTH EXTRACTIONS     PORT-A-CATH REMOVAL Left 02/07/2021   Procedure: REMOVAL PORT-A-CATH;  Surgeon: Mickeal Skinner, MD;  Location: [facility];  Service: General;  Laterality: Left;  60 MINUTES ROOM 4   SPINAL CORD STIMULATOR IMPLANT     Current Outpatient Medications on File Prior to Visit  Medication Sig Dispense Refill   acetaminophen (TYLENOL) 500 MG tablet Take 1-2 tablets (500-1,000 mg total) by mouth every 8 (eight) hours as needed (Max dose 3000 mg in 24 hours).     amLODipine (NORVASC) 2.5 MG tablet Take 1 tablet (2.5 mg total) by mouth daily. 90 tablet 1   azelastine (ASTELIN) 0.1 % nasal spray Place 2 sprays into both nostrils 2 (two) times daily. 30 mL 5   baclofen (LIORESAL) 10 MG tablet Take 10 mg by mouth daily as needed for muscle spasms.     Calcium Citrate 200 MG TABS Take 200 mg of elemental calcium by mouth daily.     carbidopa-levodopa (SINEMET IR) 25-100 MG tablet Take as needed for severe restless leg syndrome. 30 tablet 2   cholecalciferol (VITAMIN D3) 25 MCG (1000 UNIT) tablet Take 1,000 Units by mouth daily.     cycloSPORINE (RESTASIS) 0.05 % ophthalmic emulsion Place 1 drop into both eyes 2 (two) times daily.     dexlansoprazole (DEXILANT) 60 MG capsule Take 1 capsule (60 mg total) by mouth daily. Crack open the capsule and pour into spoon prior to ingestion. 30 capsule 11   Erenumab-aooe (AIMOVIG, 140 MG DOSE,) 70 MG/ML SOAJ Inject 70 mg into the skin every 30 (thirty) days.     estradiol (ESTRACE) 0.1 MG/GM vaginal cream Place 1 Applicatorful vaginally 3 (three) times a week.     ferrous sulfate 325 (65 FE) MG tablet Take 650 mg by mouth daily with breakfast.     hydrOXYzine (VISTARIL) 25 MG capsule Take 1 capsule (25 mg total) by mouth 2 (two) times daily as needed for anxiety. 30 capsule 5   ibuprofen (ADVIL) 200 MG tablet Take 400 mg by mouth every 6 (six) hours as  needed for mild pain.     immune globulin, human, (GAMMAGARD S/D LESS IGA) 10 g  injection Inject 2 g/kg into the vein every 21 ( twenty-one) days.     lamoTRIgine (LAMICTAL) 200 MG tablet Take 1 tablet (200 mg total) by mouth at bedtime. 90 tablet 1   levothyroxine (SYNTHROID) 75 MCG tablet TAKE ONE (1) TABLET BY MOUTH EACH DAY 30MINUTES BEFORE BREAKFAST ON EMPTY STOMACH 90 tablet 1   loperamide (IMODIUM A-D) 2 MG tablet Take 2 mg by mouth 4 (four) times daily as needed for diarrhea or loose stools.     magnesium oxide (MAG-OX) 400 MG tablet Take 400 mg by mouth daily.      mirtazapine (REMERON) 30 MG tablet Take 1 tablet (30 mg total) by mouth at bedtime. 90 tablet 1   oxybutynin (DITROPAN) 5 MG/5ML syrup Take 2.5 mLs (2.5 mg total) by mouth 2 (two) times daily. 473 mL 1   potassium chloride (KLOR-CON 10) 10 MEQ tablet Take 1 tablet (10 mEq total) by mouth 4 (four) times daily. 120 tablet 1   rOPINIRole (REQUIP) 1 MG tablet Take 1.5 tablet 2-3 hour prior to bedtime. 135 tablet 3   saccharomyces boulardii (FLORASTOR) 250 MG capsule Take 1 capsule (250 mg total) by mouth 2 (two) times daily. 30 capsule 3   sucralfate (CARAFATE) 1 GM/10ML suspension Take 20 mLs (2 g total) by mouth 3 (three) times daily as needed (gerd).     tamsulosin (FLOMAX) 0.4 MG CAPS capsule Take 1 capsule (0.4 mg total) by mouth daily. 30 capsule 1   tiZANidine (ZANAFLEX) 4 MG tablet Take 1 tablet (4 mg total) by mouth every 6 (six) hours as needed for muscle spasms. 10 tablet 0   torsemide (DEMADEX) 20 MG tablet TAKE 1 TABLET BY MOUTH DAILY 90 tablet 1   triamcinolone cream (KENALOG) 0.1 % Apply 1 application topically 2 (two) times daily as needed (rash).     UNABLE TO FIND Take 1 tablet by mouth daily. Med Name: [name] 1 by mouth daily for UTI prevention     vitamin B-12 (CYANOCOBALAMIN) 1000 MCG tablet Take 1 tablet (1,000 mcg total) by mouth daily. 30 tablet 0   ZINC OXIDE, TOPICAL, 10 % CREA Apply 1 application topically 2 (two) times daily as needed (rash).     No current facility-administered  medications on file prior to visit.   Allergies  Allergen Reactions   Imitrex [Sumatriptan] Nausea And Vomiting    Ringing in the ears, migraines are worse   Tetracycline Nausea Only    Causes ringing in ears, dizziness, migraine, nausea   Corticosteroids Other (See Comments)    12/02/2016 interview by YBO: Oral dosing causes migraines/nausea/tinnitus. Prev tolerated IV and intranasal admin w/o difficulty.   Cefixime Other (See Comments)    Headache    Ciprofloxacin Other (See Comments)    Headache, dizziness, ringing in the ears   Doxycycline Other (See Comments)    unknown   Gabapentin     Throat swells/closes   Isometheptene-Dichloral-Apap Other (See Comments)    unknown   Ketorolac     12/02/2016 interview by FBP: Oral dosing prednisone/corticosteroids causes migraines/nausea/tinnitus. Prev tolerated IV and intranasal admin w/o difficulty.   Prednisone Other (See Comments)    Migraine, dizzy, ringing in ears-can take it IV 12/02/2016 interview by [name]: Oral prednisone dosing causes migraines/nausea/tinnitus. Prev tolerated IV and intranasal admin w/o difficulty.   Promethazine Other (See Comments)    causes severe abdominal pain when taken  IV; however she can take PO or IM   Stadol [Butorphanol]     Cerebral pain   Erythromycin Base Diarrhea and Itching    Severe diarrhea   Propoxyphene Nausea Only   Social History   Socioeconomic History   Marital status: Married    Spouse name: Not on file   Number of children: 2   Years of education: Not on file   Highest education level: GED or equivalent  Occupational History   Occupation: retired  Tobacco Use   Smoking status: Never   Smokeless tobacco: Never  Vaping Use   Vaping Use: Never used  Substance and Sexual Activity   Alcohol use: Not Currently   Drug use: Never   Sexual activity: Not on file  Other Topics Concern   Not on file  Social History Narrative   Diet: None      Caffeine: coffee, tea, sodas less  than or 1 daily.      Married, if yes what year: Yes, 1972      Do you live in a house, apartment, assisted living, condo, trailer, ect: Two story house       Pets: None      Current/Past profession: Teach      Exercise: None         Living Will: No   DNR: No   POA/HPOA: No      Functional Status:   Do you have difficulty bathing or dressing yourself? yes   Do you have difficulty preparing food or eating? yes   Do you have difficulty managing your medications? yes   Do you have difficulty managing your finances?yes   Do you have difficulty affording your medications? no   Social Determinants of Health   Financial Resource Strain: Not on file  Food Insecurity: Not on file  Transportation Needs: Not on file  Physical Activity: Not on file  Stress: Not on file  Social Connections: Not on file  Intimate Partner Violence: Not on file   Family History  Problem Relation Age of Onset   Hypertension Mother    GER disease Mother    Dementia Mother    Osteoporosis Mother    Arthritis Mother    Bipolar disorder Mother    Parkinson's disease Father    Congestive Heart Failure Father    Pneumonia Father    Arthritis Father    Dementia Father    Depression Father    Asthma Sister    Depression Sister    Bipolar disorder Brother    Asthma Daughter    Colon cancer Neg Hx    Esophageal cancer Neg Hx    Stomach cancer Neg Hx    Rectal cancer Neg Hx      Vitals BP 106/67   Pulse 80   Temp 98.4 F (36.9 C) (Oral)   Wt 124 lb (56.2 kg)   SpO2 96%   BMI 22.68 kg/m    Examination  General - not in acute distress, comfortably sitting in chair HEENT - PEERLA, no pallor and no icterus Chest - b/l clear air entry, no additional sounds CVS- Normal s1s2, RRR Abdomen - Soft, Non tender , non distended Ext- no pedal edema Neuro: grossly normal Back - WNL Psych : calm and cooperative   Recent labs CBC Latest Ref Rng & Units 02/18/2021 02/12/2021 02/11/2021  WBC 3.8 -  10.8 Thousand/uL 8.8 3.4(L) 4.0  Hemoglobin 11.7 - 15.5 g/dL 13.5 10.9(L) 10.9(L)  Hematocrit 35.0 - 45.0 %  40.5 34.3(L) 34.3(L)  Platelets 140 - 400 Thousand/uL 271 146(L) 143(L)   CMP Latest Ref Rng & Units 02/18/2021 02/12/2021 02/11/2021  Glucose 65 - 139 mg/dL 80 84 84  BUN 7 - 25 mg/dL 29(H) 17 14  Creatinine 0.60 - 0.93 mg/dL 0.97(H) 0.95 0.75  Sodium 135 - 146 mmol/L 138 136 138  Potassium 3.5 - 5.3 mmol/L 4.4 4.4 4.5  Chloride 98 - 110 mmol/L 98 105 109  CO2 20 - 32 mmol/L 26 24 24   Calcium 8.6 - 10.4 mg/dL 10.0 8.6(L) 8.6(L)  Total Protein 6.5 - 8.1 g/dL - - -  Total Bilirubin 0.3 - 1.2 mg/dL - - -  Alkaline Phos 38 - 126 U/L - - -  AST 15 - 41 U/L - - -  ALT 0 - 44 U/L - - -     Pertinent Microbiology Results for orders placed or performed during the hospital encounter of 02/04/21  Culture, blood (routine x 2)     Status: None   Collection Time: 02/04/21  5:12 PM   Specimen: BLOOD  Result Value Ref Range Status   Specimen Description   Final    BLOOD RIGHT ARM Performed at [facility] [address] [place], [place] [address]    Special Requests   Final    BOTTLES DRAWN AEROBIC AND ANAEROBIC Blood Culture adequate volume Performed at [facility] [address], [place], [place] [address]    Culture   Final    NO GROWTH 5 DAYS Performed at [facility], [place] [address] [place], [place] [address]    Report Status 02/09/2021 FINAL  Final  Culture, blood (routine x 2)     Status: None   Collection Time: 02/04/21  5:12 PM   Specimen: BLOOD  Result Value Ref Range Status   Specimen Description   Final    BLOOD LEFT ANTECUBITAL Performed at [facility] [address] [place], [place] [address]    Special Requests   Final    BOTTLES DRAWN AEROBIC ONLY Blood Culture adequate volume Performed at [facility] [address], [place], [place] [address]    Culture   Final    NO  GROWTH 5 DAYS Performed at [facility], [place] [address], [place], [place] [address]    Report Status 02/09/2021 FINAL  Final  Resp Panel by RT-PCR (Flu A&B, Covid) Nasopharyngeal Swab     Status: None   Collection Time: 02/04/21  5:56 PM   Specimen: Nasopharyngeal Swab; Nasopharyngeal(NP) swabs in vial transport medium  Result Value Ref Range Status   SARS Coronavirus 2 by RT PCR NEGATIVE NEGATIVE Final    Comment: (NOTE) SARS-CoV-2 target nucleic acids are NOT DETECTED.  The SARS-CoV-2 RNA is generally detectable in upper respiratory specimens during the acute phase of infection. The lowest concentration of SARS-CoV-2 viral copies this assay can detect is 138 copies/mL. A negative result does not preclude SARS-Cov-2 infection and should not be used as the sole basis for treatment or other patient management decisions. A negative result may occur with  improper specimen collection/handling, submission of specimen other than nasopharyngeal swab, presence of viral mutation(s) within the areas targeted by this assay, and inadequate number of viral copies(<138 copies/mL). A negative result must be combined with clinical observations, patient history, and epidemiological information. The expected result is Negative.  Fact Sheet for Patients:  EntrepreneurPulse.com.au  Fact Sheet for Healthcare Providers:  IncredibleEmployment.be  This test is no t yet approved or cleared by the [place] and  has been authorized for detection and/or diagnosis of SARS-CoV-2 by FDA under an Emergency Use Authorization (EUA). This EUA will remain  in effect (meaning this test can be used) for the duration of the COVID-19 declaration under Section 564(b)(1) of the Act, 21 U.S.C.section 360bbb-3(b)(1), unless the authorization is terminated  or revoked sooner.       Influenza A by PCR NEGATIVE NEGATIVE Final   Influenza B by PCR NEGATIVE NEGATIVE Final     Comment: (NOTE) The Xpert Xpress SARS-CoV-2/FLU/RSV plus assay is intended as an aid in the diagnosis of influenza from Nasopharyngeal swab specimens and should not be used as a sole basis for treatment. Nasal washings and aspirates are unacceptable for Xpert Xpress SARS-CoV-2/FLU/RSV testing.  Fact Sheet for Patients: EntrepreneurPulse.com.au  Fact Sheet for Healthcare Providers: IncredibleEmployment.be  This test is not yet approved or cleared by the [place] FDA and has been authorized for detection and/or diagnosis of SARS-CoV-2 by FDA under an Emergency Use Authorization (EUA). This EUA will remain in effect (meaning this test can be used) for the duration of the COVID-19 declaration under Section 564(b)(1) of the Act, 21 U.S.C. section 360bbb-3(b)(1), unless the authorization is terminated or revoked.  Performed at [facility], [place] [address] [place], [place] [address]   Culture, blood (single)     Status: None   Collection Time: 02/04/21  6:37 PM   Specimen: BLOOD  Result Value Ref Range Status   Specimen Description   Final    BLOOD PORTA CATH LEFT CHEST Performed at [facility] [address], [place], [place] [address]    Special Requests   Final    BOTTLES DRAWN AEROBIC AND ANAEROBIC Blood Culture adequate volume Performed at [facility] [address] [place], [place] [address]    Culture  Setup Time   Final    GRAM NEGATIVE RODS IN BOTH AEROBIC AND ANAEROBIC BOTTLES CRITICAL RESULT CALLED TO, READ BACK BY AND VERIFIED WITH: Melodye Ped [facility] 2694 02/05/21 A BROWNING    Culture   Final    GRAM NEGATIVE RODS SEE SEPARATE REPORT Performed at [facility], [place] [address] [place], [place] [address]    Report Status 03/04/2021 FINAL  Final  Blood Culture ID Panel (Reflexed)     Status: None   Collection Time: 02/04/21  6:37 PM  Result Value Ref Range  Status   Enterococcus faecalis NOT DETECTED NOT DETECTED Final   Enterococcus Faecium NOT DETECTED NOT DETECTED Final   Listeria monocytogenes NOT DETECTED NOT DETECTED Final   Staphylococcus species NOT DETECTED NOT DETECTED Final   Staphylococcus aureus (BCID) NOT DETECTED NOT DETECTED Final   Staphylococcus epidermidis NOT DETECTED NOT DETECTED Final   Staphylococcus lugdunensis NOT DETECTED NOT DETECTED Final   Streptococcus species NOT DETECTED NOT DETECTED Final   Streptococcus agalactiae NOT DETECTED NOT DETECTED Final   Streptococcus pneumoniae NOT DETECTED NOT DETECTED Final   Streptococcus pyogenes NOT DETECTED NOT DETECTED Final   A.calcoaceticus-baumannii NOT DETECTED NOT DETECTED Final   Bacteroides fragilis NOT DETECTED NOT DETECTED Final   Enterobacterales NOT DETECTED NOT DETECTED Final   Enterobacter cloacae complex NOT DETECTED NOT DETECTED Final   Escherichia coli NOT DETECTED NOT DETECTED Final   Klebsiella aerogenes NOT DETECTED NOT DETECTED Final   Klebsiella oxytoca NOT DETECTED NOT DETECTED Final   Klebsiella pneumoniae NOT DETECTED NOT DETECTED  Final   Proteus species NOT DETECTED NOT DETECTED Final   Salmonella species NOT DETECTED NOT DETECTED Final   Serratia marcescens NOT DETECTED NOT DETECTED Final   Haemophilus influenzae NOT DETECTED NOT DETECTED Final   Neisseria meningitidis NOT DETECTED NOT DETECTED Final   Pseudomonas aeruginosa NOT DETECTED NOT DETECTED Final   Stenotrophomonas maltophilia NOT DETECTED NOT DETECTED Final   Candida albicans NOT DETECTED NOT DETECTED Final   Candida auris NOT DETECTED NOT DETECTED Final   Candida glabrata NOT DETECTED NOT DETECTED Final   Candida krusei NOT DETECTED NOT DETECTED Final   Candida parapsilosis NOT DETECTED NOT DETECTED Final   Candida tropicalis NOT DETECTED NOT DETECTED Final   Cryptococcus neoformans/gattii NOT DETECTED NOT DETECTED Final    Comment: Performed at [facility], [address], [place], [place] [address]  Culture, Urine     Status: None   Collection Time: 02/05/21  2:00 AM   Specimen: Urine, Clean Catch  Result Value Ref Range Status   Specimen Description   Final    URINE, CLEAN CATCH Performed at [facility], [place] [address], [place], [place] [address]    Special Requests   Final    NONE Performed at [facility], [place] [address], [place], [place] [address]    Culture   Final    NO GROWTH Performed at [facility], [place] [address] [place], [place] [address]    Report Status 02/06/2021 FINAL  Final  Surgical pcr screen     Status: Abnormal   Collection Time: 02/07/21  8:03 AM   Specimen: Nasal Mucosa; Nasal Swab  Result Value Ref Range Status   MRSA, PCR NEGATIVE NEGATIVE Final   Staphylococcus aureus POSITIVE (A) NEGATIVE Final    Comment: (NOTE) The Xpert SA Assay (FDA approved for NASAL specimens in patients 26 years of age and older), is one component of a comprehensive surveillance program. It is not intended to diagnose infection nor to guide or monitor treatment. Performed at [facility], [place] [address], [place], [place] [address]     Pertinent Imagin All pertinent labs/Imagings/notes reviewed. All pertinent plain films and CT images have been personally visualized and interpreted; radiology reports have been reviewed. Decision making incorporated into the Impression / Recommendations.  I have spent a total of 60 minutes of face-to-face and non-face-to-face time, excluding clinical staff time, preparing to see patient, ordering tests and/or medications, and provide counseling the patient    Electronically signed by:  Rosiland Oz, MD Infectious Disease Physician [facility] [address] [place], [place] [address] Phone: [phone]  Fax: [phone]

## 2021-04-02 ENCOUNTER — Telehealth: Payer: Self-pay | Admitting: Cardiology

## 2021-04-02 NOTE — Telephone Encounter (Signed)
Called patient, pt reports she "almost collapsed" this morning, per pt not because of syncopal symptoms but due to weakness. She reports she was hospitalized at the end of June and her weakness has increased since then. Pt also reports she has had "dull, stabbing" chest pain on the left side of her chest and left arm, which extends to her jaw. She is not currently having this pain, but reports it "comes and goes". She complains of weakness, shortness of breath, and "shaking". Pt denies one sided weakness or facial droop. Pt denies swelling or weight gain (3lbs in 1 day or 5lbs in 1 week). Her blood pressure while on the phone with nurse is 97/50, HR 76.   Nurse consulted with DOD Dr. [place], who suggested patient make an appointment with her primary care provider for weakness and also be seen in office for her chest pain. Nurse made pt an appointment with Dr. Percival Spanish 8/24 at 4:30pm, pt was not able to make an earlier appointment found. Nurse also advised pt if her chest pain returns, or she experiences worsening shortness of breath, syncope, or any other concerning symptoms she should call 911 or be taken to the emergency room. Pt verbalized understanding.

## 2021-04-02 NOTE — Telephone Encounter (Signed)
Patient states since 02/14/21, after being discharged from the hospital, she has been experiencing pain in her left shoulder blade and under her jaw with mild occasional chest pain. She states her legs have also been shaky and "feeling rubbery."   Pt c/o of Chest Pain: STAT if CP now or developed within 24 hours  1. Are you having CP right now?  No   2. Are you experiencing any other symptoms (ex. SOB, nausea, vomiting, sweating)?  Left jaw/shoulder pain   3. How long have you been experiencing CP?  Since 02/14/21  4. Is your CP continuous or coming and going? Coming and going   5. Have you taken Nitroglycerin?  No  ?

## 2021-04-03 ENCOUNTER — Other Ambulatory Visit: Payer: Self-pay

## 2021-04-03 ENCOUNTER — Ambulatory Visit: Payer: Medicare PPO | Admitting: Orthopedic Surgery

## 2021-04-03 ENCOUNTER — Emergency Department (HOSPITAL_COMMUNITY)
Admission: EM | Admit: 2021-04-03 | Discharge: 2021-04-03 | Disposition: A | Discharge status: Home / Self Care | Payer: Medicare PPO | Attending: Emergency Medicine | Admitting: Emergency Medicine

## 2021-04-03 ENCOUNTER — Emergency Department (HOSPITAL_COMMUNITY): Payer: Medicare PPO

## 2021-04-03 ENCOUNTER — Encounter: Payer: Self-pay | Admitting: Orthopedic Surgery

## 2021-04-03 ENCOUNTER — Encounter (HOSPITAL_COMMUNITY): Payer: Self-pay

## 2021-04-03 VITALS — BP 110/68 | HR 90 | Temp 97.3°F | Ht 62.0 in | Wt 126.6 lb

## 2021-04-03 DIAGNOSIS — R531 Weakness: Secondary | ICD-10-CM | POA: Diagnosis not present

## 2021-04-03 DIAGNOSIS — Z79899 Other long term (current) drug therapy: Secondary | ICD-10-CM | POA: Insufficient documentation

## 2021-04-03 DIAGNOSIS — Z85118 Personal history of other malignant neoplasm of bronchus and lung: Secondary | ICD-10-CM | POA: Insufficient documentation

## 2021-04-03 DIAGNOSIS — I2510 Atherosclerotic heart disease of native coronary artery without angina pectoris: Secondary | ICD-10-CM | POA: Diagnosis not present

## 2021-04-03 DIAGNOSIS — R079 Chest pain, unspecified: Secondary | ICD-10-CM

## 2021-04-03 DIAGNOSIS — R0789 Other chest pain: Secondary | ICD-10-CM | POA: Diagnosis not present

## 2021-04-03 DIAGNOSIS — E039 Hypothyroidism, unspecified: Secondary | ICD-10-CM | POA: Insufficient documentation

## 2021-04-03 DIAGNOSIS — N189 Chronic kidney disease, unspecified: Secondary | ICD-10-CM | POA: Diagnosis not present

## 2021-04-03 LAB — HEPATIC FUNCTION PANEL
ALT: 30 U/L (ref 0–44)
AST: 62 U/L — ABNORMAL HIGH (ref 15–41)
Albumin: 4 g/dL (ref 3.5–5.0)
Alkaline Phosphatase: 93 U/L (ref 38–126)
Bilirubin, Direct: 0.3 mg/dL — ABNORMAL HIGH (ref 0.0–0.2)
Total Bilirubin: 0.2 mg/dL — ABNORMAL LOW (ref 0.3–1.2)
Total Protein: 8.5 g/dL — ABNORMAL HIGH (ref 6.5–8.1)

## 2021-04-03 LAB — BASIC METABOLIC PANEL
Anion gap: 10 (ref 5–15)
BUN: 40 mg/dL — ABNORMAL HIGH (ref 8–23)
CO2: 26 mmol/L (ref 22–32)
Calcium: 10.4 mg/dL — ABNORMAL HIGH (ref 8.9–10.3)
Chloride: 104 mmol/L (ref 98–111)
Creatinine, Ser: 1.07 mg/dL — ABNORMAL HIGH (ref 0.44–1.00)
GFR, Estimated: 55 mL/min — ABNORMAL LOW (ref >60)
Glucose, Bld: 80 mg/dL (ref 70–99)
Potassium: 5.1 mmol/L (ref 3.5–5.1)
Sodium: 140 mmol/L (ref 135–145)

## 2021-04-03 LAB — CBC
HCT: 45.6 % (ref 36.0–46.0)
Hemoglobin: 14.8 g/dL (ref 12.0–15.0)
MCH: 29.7 pg (ref 26.0–34.0)
MCHC: 32.5 g/dL (ref 30.0–36.0)
MCV: 91.4 fL (ref 80.0–100.0)
Platelets: 291 10*3/uL (ref 150–400)
RBC: 4.99 MIL/uL (ref 3.87–5.11)
RDW: 13.7 % (ref 11.5–15.5)
WBC: 11.2 10*3/uL — ABNORMAL HIGH (ref 4.0–10.5)
nRBC: 0 % (ref 0.0–0.2)

## 2021-04-03 LAB — URINALYSIS, ROUTINE W REFLEX MICROSCOPIC
Bacteria, UA: NONE SEEN
Bilirubin Urine: NEGATIVE
Glucose, UA: NEGATIVE mg/dL
Hgb urine dipstick: NEGATIVE
Ketones, ur: NEGATIVE mg/dL
Nitrite: NEGATIVE
Protein, ur: NEGATIVE mg/dL
Specific Gravity, Urine: 1.008 (ref 1.005–1.030)
pH: 5 (ref 5.0–8.0)

## 2021-04-03 LAB — TROPONIN I (HIGH SENSITIVITY)
Troponin I (High Sensitivity): 6 ng/L (ref <18)
Troponin I (High Sensitivity): 5 ng/L (ref <18)

## 2021-04-03 NOTE — Patient Instructions (Signed)
  Please report to ED for further evaluation.  

## 2021-04-03 NOTE — Discharge Instructions (Addendum)
Follow-up with your doctor if any problems

## 2021-04-03 NOTE — Telephone Encounter (Signed)
Amy with [facility] is following up--patient is in office with her now and she took an EKG which is in system. Can someone review and return call to Amy with advisement? She advised patient if symptoms are persistent and worsening to go to ED, but patient declines for now--would like to make sure Dr. Percival Spanish agrees. Please advise, Amy is hopeful for a call back ASAP as patient is still with her in their office.

## 2021-04-03 NOTE — ED Provider Notes (Signed)
[facility] DEPT Provider Note   CSN: [id number] Arrival date & time: 04/03/21  1309     History Chief Complaint  Patient presents with   Chest Pain    Harmonee Tozer is a 75 y.o. female.  Patient sent over from the office because of irregular EKG.  Patient not having any chest pain or shortness of breath  The history is provided by the patient and medical records. No language interpreter was used.  Weakness Severity:  Mild Onset quality:  Gradual Timing:  Intermittent Progression:  Resolved Chronicity:  New Context: not alcohol use   Relieved by:  Nothing Worsened by:  Nothing Ineffective treatments:  None tried Associated symptoms: no abdominal pain, no chest pain, no cough, no diarrhea, no frequency, no headaches and no seizures       Past Medical History:  Diagnosis Date   Anemia    Arachnoiditis    Bipolar disorder (HCC)    C. 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) 2011, 2014   Memory loss    Migraine    Opioid abuse ([place])    Osteoporosis    Presence of neurostimulator    Restless legs    SIRS (systemic inflammatory response syndrome) (HCC)    Sleep apnea     Patient Active Problem List   Diagnosis Date Noted   Medication monitoring encounter 04/01/2021   Gram-negative bacteremia 03/03/2021   C. difficile diarrhea 03/03/2021   Rigors 67/34/1935   Acute metabolic encephalopathy 90/24/0973   SIRS (systemic inflammatory response syndrome) (HCC) 01/14/2021   Elevated troponin 01/14/2021   Hypokalemia 01/14/2021   Headache 01/14/2021   Chest discomfort 03/22/2019   Weight loss 01/27/2019   Acquired hypothyroidism 01/27/2019   Esophagitis  01/27/2019   Intractable migraine without status migrainosus 01/27/2019   Osteoporosis 01/27/2019   Chronic sinusitis 12/28/2018   Food intolerance/GI symptoms 12/28/2018   Acute maxillary sinusitis 12/28/2018   Lower leg edema 12/20/2018   Chronic pain of both shoulders 05/24/2018   Bilateral leg edema 05/24/2018   Chronic pain syndrome 03/31/2018   Left bundle branch block 10/28/2017   Palpitations 10/28/2017   History of gastric bypass 05/21/2017   Polypharmacy 02/11/2017   Senile nuclear sclerosis 11/26/2016   Chronic post-thoracotomy pain 04/09/2014   Chronic kidney disease 04/09/2014   Sleep disturbances 09/20/2012   Restless legs syndrome 07/07/2012   Hypertonicity of bladder 05/23/2012   Essential tremor 04/01/2012   Memory loss 04/01/2012   Incomplete emptying of bladder 01/14/2012   Urge incontinence 10/28/2011   Tension type headache 06/10/2011   Mixed urge and stress incontinence 05/28/2011   Hypogammaglobulinemia ([place]) 06/22/2010   Severe episode of recurrent major depressive disorder, without psychotic features ([place]) 06/22/2010   Hypothyroidism 02/12/2010   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[place]) 01/31/2010   ARACHNOIDITIS 01/31/2010   OBSTRUCTIVE SLEEP APNEA 01/31/2010   Chronic rhinitis 01/31/2010   Arachnoiditis 01/31/2010    Past Surgical History:  Procedure Laterality Date   ABDOMINAL HYSTERECTOMY     CARPAL TUNNEL RELEASE     CATARACT EXTRACTION Bilateral 2019   CHOLECYSTECTOMY     COLONOSCOPY  2015   [facility]   CT LUNG SCREENING  2018   DG  BONE DENSITY ([place] HX)  2018   DIAGNOSTIC MAMMOGRAM  2019   GASTRIC BYPASS     LUNG CANCER SURGERY  02/2010   MULTIPLE TOOTH EXTRACTIONS     PORT-A-CATH REMOVAL Left 02/07/2021   Procedure: REMOVAL PORT-A-CATH;  Surgeon: Kinsinger, Arta Bruce, MD;  Location: [facility] OR[facility];  Service: General;  Laterality: Left;  60 MINUTES ROOM 4   SPINAL CORD STIMULATOR IMPLANT       OB History   No obstetric history on file.      Family History  Problem Relation Age of Onset   Hypertension Mother    GER disease Mother    Dementia Mother    Osteoporosis Mother    Arthritis Mother    Bipolar disorder Mother    Parkinson's disease Father    Congestive Heart Failure Father    Pneumonia Father    Arthritis Father    Dementia Father    Depression Father    Asthma Sister    Depression Sister    Bipolar disorder Brother    Asthma Daughter    Colon cancer Neg Hx    Esophageal cancer Neg Hx    Stomach cancer Neg Hx    Rectal cancer Neg Hx     Social History   Tobacco Use   Smoking status: Never   Smokeless tobacco: Never  Vaping Use   Vaping Use: Never used  Substance Use Topics   Alcohol use: Not Currently   Drug use: Never    Home Medications Prior to Admission medications   Medication Sig Start Date End Date Taking? Authorizing Provider  acetaminophen (TYLENOL) 500 MG tablet Take 500 mg by mouth every 6 (six) hours as needed for moderate pain.   Yes [provider]  amLODipine (NORVASC) 2.5 MG tablet Take 1 tablet (2.5 mg total) by mouth daily. 02/20/21 05/21/21 Yes Minus Breeding, MD  azelastine (ASTELIN) 0.1 % nasal spray Place 2 sprays into both nostrils 2 (two) times daily. 03/10/21  Yes Ambs, Kathrine Cords, FNP  baclofen (LIORESAL) 10 MG tablet Take 10 mg by mouth daily as needed for muscle spasms. 06/03/20  Yes [provider]  Calcium Citrate 200 MG TABS Take 200 mg of elemental calcium by mouth daily. 12/02/16  Yes [provider]  carbidopa-levodopa (SINEMET IR) 25-100 MG tablet Take as needed for severe restless leg syndrome. 03/06/21  Yes Patel, Donika K, DO  cycloSPORINE (RESTASIS) 0.05 % ophthalmic emulsion Place 1 drop into both eyes 2 (two) times daily.   Yes [provider]  dexlansoprazole (DEXILANT) 60 MG capsule Take 1 capsule (60 mg total) by mouth daily. Crack open the capsule and pour into spoon prior to ingestion. 02/20/21  Yes Esterwood, Amy S, PA-C   Erenumab-aooe (AIMOVIG, 140 MG DOSE,) 70 MG/ML SOAJ Inject 70 mg into the skin every 30 (thirty) days.   Yes [provider]  estradiol (ESTRACE) 0.1 MG/GM vaginal cream Place 1 Applicatorful vaginally 3 (three) times a week. 01/12/20  Yes [provider]  ferrous sulfate 325 (65 FE) MG tablet Take 650 mg by mouth daily with breakfast.   Yes [provider]  heparin lock flush 100 UNIT/ML SOLN injection Inject 500 Units into the vein every 21 ( twenty-one) days. 03/31/21  Yes [provider]  hydrOXYzine (VISTARIL) 25 MG capsule Take 1 capsule (25 mg total) by mouth 2 (two) times daily as needed for anxiety. 02/25/21  Yes Thayer Headings, PMHNP  ibuprofen (ADVIL) 200  MG tablet Take 200 mg by mouth daily.   Yes [provider]  immune globulin, human, (GAMMAGARD S/D LESS IGA) 10 g injection Inject 2 g/kg into the vein every 21 ( twenty-one) days.   Yes [provider]  lamoTRIgine (LAMICTAL) 200 MG tablet Take 1 tablet (200 mg total) by mouth at bedtime. 02/25/21 05/26/21 Yes Thayer Headings, PMHNP  levothyroxine (SYNTHROID) 75 MCG tablet TAKE ONE (1) TABLET BY MOUTH EACH DAY 30MINUTES BEFORE BREAKFAST ON EMPTY STOMACH 03/12/21  Yes [name], NP  loperamide (IMODIUM A-D) 2 MG tablet Take 2 mg by mouth 4 (four) times daily as needed for diarrhea or loose stools.   Yes [provider]  magnesium oxide (MAG-OX) 400 MG tablet Take 400 mg by mouth daily.    Yes [provider]  mirtazapine (REMERON) 30 MG tablet Take 1 tablet (30 mg total) by mouth at bedtime. 02/25/21 05/26/21 Yes Thayer Headings, PMHNP  oxybutynin (DITROPAN) 5 MG/5ML syrup Take 2.5 mLs (2.5 mg total) by mouth 2 (two) times daily. 02/25/21  Yes [name], NP  potassium chloride (KLOR-CON 10) 10 MEQ tablet Take 1 tablet (10 mEq total) by mouth 4 (four) times daily. Patient taking differently: Take 40 mEq by mouth in the morning. 02/20/21  Yes Wardell Honour, MD  rOPINIRole (REQUIP) 1 MG tablet Take 1.5 tablet 2-3 hour prior to bedtime. Patient taking differently: Take 1 mg by mouth at bedtime. 03/06/21  Yes Patel, Donika K, DO  saccharomyces boulardii (FLORASTOR) 250 MG capsule Take 1 capsule (250 mg total) by mouth 2 (two) times daily. 01/02/21  Yes [place], Amy E, NP  sodium chloride, PF, 0.9 % injection Inject 5-10 mLs into the vein as needed (Immunoglobulin). 02/12/21  Yes [provider]  sucralfate (CARAFATE) 1 GM/10ML suspension Take 20 mLs (2 g total) by mouth 3 (three) times daily as needed (gerd). 02/14/21  Yes Aline August, MD  tamsulosin (FLOMAX) 0.4 MG CAPS capsule Take 1 capsule (0.4 mg total) by mouth daily. 01/23/21  Yes Shelly Coss, MD  tiZANidine (ZANAFLEX) 4 MG tablet Take 1 tablet (4 mg total) by mouth every 6 (six) hours as needed for muscle spasms. Patient taking differently: Take 12 mg by mouth at bedtime. 11/24/20  Yes Drenda Freeze, MD  torsemide (DEMADEX) 20 MG tablet TAKE 1 TABLET BY MOUTH DAILY 01/27/21  Yes [name], NP  triamcinolone cream (KENALOG) 0.1 % Apply 1 application topically 2 (two) times daily as needed (rash).   Yes [provider]  vitamin B-12 (CYANOCOBALAMIN) 1000 MCG tablet Take 1 tablet (1,000 mcg total) by mouth daily. 02/14/21  Yes Aline August, MD  ZINC OXIDE, TOPICAL, 10 % CREA Apply 1 application topically 2 (two) times daily as needed (rash). 02/14/21  Yes Aline August, MD  acetaminophen (TYLENOL) 500 MG tablet Take 1-2 tablets (500-1,000 mg total) by mouth every 8 (eight) hours as needed (Max dose 3000 mg in 24 hours). Patient not taking: Reported on 04/03/2021 06/11/20   [name], NP  UNABLE TO FIND Take 1 tablet by mouth daily. Med Name: [name] 1 by mouth daily for UTI prevention    [provider]    Allergies    Imitrex [sumatriptan], Tetracycline, Corticosteroids, Cefixime, Ciprofloxacin, Doxycycline, Gabapentin, Isometheptene-dichloral-apap,  Ketorolac, Prednisone, Promethazine, Stadol [butorphanol], Erythromycin base, and Propoxyphene  Review of Systems   Review of Systems  Constitutional:  Negative for appetite change and fatigue.  HENT:  Negative for congestion, ear discharge  and sinus pressure.   Eyes:  Negative for discharge.  Respiratory:  Negative for cough.   Cardiovascular:  Negative for chest pain.  Gastrointestinal:  Negative for abdominal pain and diarrhea.  Genitourinary:  Negative for frequency and hematuria.  Musculoskeletal:  Negative for back pain.  Skin:  Negative for rash.  Neurological:  Positive for weakness. Negative for seizures and headaches.  Psychiatric/Behavioral:  Negative for hallucinations.    Physical Exam Updated Vital Signs BP (!) 115/59   Pulse 76   Temp 97.8 F (36.6 C) (Oral)   Resp 18   Ht 5\' 2"  (1.575 m)   Wt 57.4 kg   SpO2 99%   BMI 23.16 kg/m   Physical Exam Constitutional:      Appearance: She is well-developed.  HENT:     Head: Normocephalic.     Nose: Nose normal.  Eyes:     General: No scleral icterus.    Conjunctiva/sclera: Conjunctivae normal.  Neck:     Thyroid: No thyromegaly.  Cardiovascular:     Rate and Rhythm: Normal rate and regular rhythm.     Heart sounds: No murmur heard.   No friction rub. No gallop.  Pulmonary:     Breath sounds: No stridor. No wheezing or rales.  Chest:     Chest wall: No tenderness.  Abdominal:     General: There is no distension.     Tenderness: There is no abdominal tenderness. There is no rebound.  Musculoskeletal:        General: Normal range of motion.     Cervical back: Neck supple.  Lymphadenopathy:     Cervical: No cervical adenopathy.  Skin:    Findings: No erythema or rash.  Neurological:     Mental Status: She is alert and oriented to person, place, and time.     Motor: No abnormal muscle tone.     Coordination: Coordination normal.  Psychiatric:        Behavior: Behavior normal.    ED Results /  Procedures / Treatments   Labs (all labs ordered are listed, but only abnormal results are displayed) Labs Reviewed  BASIC METABOLIC PANEL - Abnormal; Notable for the following components:      Result Value   BUN 40 (*)    Creatinine, Ser 1.07 (*)    Calcium 10.4 (*)    GFR, Estimated 55 (*)    All other components within normal limits  CBC - Abnormal; Notable for the following components:   WBC 11.2 (*)    All other components within normal limits  HEPATIC FUNCTION PANEL - Abnormal; Notable for the following components:   Total Protein 8.5 (*)    AST 62 (*)    Total Bilirubin 0.2 (*)    Bilirubin, Direct 0.3 (*)    All other components within normal limits  URINALYSIS, ROUTINE W REFLEX MICROSCOPIC - Abnormal; Notable for the following components:   Color, Urine STRAW (*)    Leukocytes,Ua TRACE (*)    All other components within normal limits  TROPONIN I (HIGH SENSITIVITY)  TROPONIN I (HIGH SENSITIVITY)    EKG None  Radiology DG Chest 2 View  Result Date: 04/03/2021 CLINICAL DATA:  Chest pain and shortness of breath EXAM: CHEST - 2 VIEW COMPARISON:  02/04/2021 FINDINGS: Redemonstrated spinal stimulator leads. Interval removal of left chest wall port. In postsurgical changes in the right lung and right rib cage. No focal pulmonary abnormality. Unchanged cardiac and mediastinal contours. No pleural effusion or pneumothorax.  No acute osseous abnormality. IMPRESSION: No acute abnormality. Electronically Signed   By: Merilyn Baba M.D.   On: 04/03/2021 14:51    Procedures Procedures   Medications Ordered in ED Medications - No data to display  ED Course  I have reviewed the triage vital signs and the nursing notes.  Pertinent labs & imaging results that were available during my care of the patient were reviewed by me and considered in my medical decision making (see chart for details).  Patient was sent over for regular EKG but the EKG shows normal sinus rhythm.  Patient  does have a lot of tremors which make it more difficult to read. MDM Rules/Calculators/A&P                           History of coronary artery disease with no acute changes on EKG and lab work.  Patient will follow up with her md Final Clinical Impression(s) / ED Diagnoses Final diagnoses:  Atypical chest pain    Rx / DC Orders ED Discharge Orders     None        Milton Ferguson, MD 04/07/21 1525

## 2021-04-03 NOTE — Telephone Encounter (Signed)
Returned call to Amy with [employer].  Advised Dr. Percival Spanish had reviewed Pt's EKG and felt it was ok and to have Pt follow up with him next week as scheduled.  At this time, Amy had already sent Pt to [facility] and Pt had arrived per review of chart.  Dr. Percival Spanish aware.  No further action needed at this time.

## 2021-04-03 NOTE — Progress Notes (Signed)
Careteam: Patient Care Team: [name], NP as PCP - General (Geriatric Medicine) Alda Berthold, DO as Consulting Physician (Neurology)  Seen by: Windell Moulding, AGNP-C  PLACE OF SERVICE:  [facility]  Advanced Directive information    Allergies  Allergen Reactions   Imitrex [Sumatriptan] Nausea And Vomiting    Ringing in the ears, migraines are worse   Tetracycline Nausea Only    Causes ringing in ears, dizziness, migraine, nausea   Corticosteroids Other (See Comments)    12/02/2016 interview by Melissa Montane: Oral dosing causes migraines/nausea/tinnitus. Prev tolerated IV and intranasal admin w/o difficulty.   Cefixime Other (See Comments)    Headache    Ciprofloxacin Other (See Comments)    Headache, dizziness, ringing in the ears   Doxycycline Other (See Comments)    unknown   Gabapentin     Throat swells/closes   Isometheptene-Dichloral-Apap Other (See Comments)    unknown   Ketorolac     12/02/2016 interview by DEY: Oral dosing prednisone/corticosteroids causes migraines/nausea/tinnitus. Prev tolerated IV and intranasal admin w/o difficulty.   Prednisone Other (See Comments)    Migraine, dizzy, ringing in ears-can take it IV 12/02/2016 interview by [name]: Oral prednisone dosing causes migraines/nausea/tinnitus. Prev tolerated IV and intranasal admin w/o difficulty.   Promethazine Other (See Comments)    causes severe abdominal pain when taken IV; however she can take PO or IM   Stadol [Butorphanol]     Cerebral pain   Erythromycin Base Diarrhea and Itching    Severe diarrhea   Propoxyphene Nausea Only    Chief Complaint  Patient presents with   Acute Visit    Patient presents today for weakness/ fatigue and shaking per pt since February 17, 2021. She reports it seems to be worsening the last few days. She reports going to cardiology on 04/09/21.   Quality Metric Gaps    Per care gap in Epic pt is due for mammogram, zoster, flu and Covid booster vaccines.     HPI:  Patient is a 75 y.o. female seen today for acute visit due to weakness.   Husband present during encounter.   She recently called her cardiologist due to ongoing weakness. She was advised to schedule with PCP and present to ED if she experiences any chest pain. Weakness began July 2022. Initially her legs began to feel heavy and she had trouble walking and standing. In the past week, she has began to have dull chest pain and sob. Chest pain radiating to left shoulder. She has a history of gastritis, continues to take Dexilant daily.   EKG done. ST depression noted. Advised to present to ED for further evaluation.     Review of Systems:  Review of Systems  Constitutional:  Negative for chills, fever, malaise/fatigue and weight loss.  Respiratory:  Positive for shortness of breath. Negative for cough and wheezing.   Cardiovascular:  Positive for chest pain and leg swelling.  Gastrointestinal:  Positive for heartburn. Negative for abdominal pain, constipation, diarrhea, nausea and vomiting.  Musculoskeletal:        Left shoulder pain.   Neurological:  Positive for tremors and weakness. Negative for dizziness and headaches.  Psychiatric/Behavioral:  Negative for depression. The patient is not nervous/anxious.    Past Medical History:  Diagnosis Date   Anemia    Arachnoiditis    Bipolar disorder (HCC)    C. 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[place])    Lung cancer ([place]) 2011, 2014   Memory loss    Migraine    Opioid abuse (HCC)    Osteoporosis    Presence of neurostimulator    Restless legs    SIRS (systemic inflammatory response syndrome) ([place])    Sleep apnea    Past Surgical History:  Procedure Laterality Date   ABDOMINAL  HYSTERECTOMY     CARPAL TUNNEL RELEASE     CATARACT EXTRACTION Bilateral 2019   CHOLECYSTECTOMY     COLONOSCOPY  2015   [facility]   CT LUNG SCREENING  2018   DG  BONE DENSITY ([place] HX)  2018   DIAGNOSTIC MAMMOGRAM  2019   GASTRIC BYPASS     LUNG CANCER SURGERY  02/2010   MULTIPLE TOOTH EXTRACTIONS     PORT-A-CATH REMOVAL Left 02/07/2021   Procedure: REMOVAL PORT-A-CATH;  Surgeon: Kieth Brightly Arta Bruce, MD;  Location: [facility];  Service: General;  Laterality: Left;  60 MINUTES ROOM 4   SPINAL CORD STIMULATOR IMPLANT     Social History:   reports that she has never smoked. She has never used smokeless tobacco. She reports that she does not currently use alcohol. She reports that she does not use drugs.  Family History  Problem Relation Age of Onset   Hypertension Mother    GER disease Mother    Dementia Mother    Osteoporosis Mother    Arthritis Mother    Bipolar disorder Mother    Parkinson's disease Father    Congestive Heart Failure Father    Pneumonia Father    Arthritis Father    Dementia Father    Depression Father    Asthma Sister    Depression Sister    Bipolar disorder Brother    Asthma Daughter    Colon cancer Neg Hx    Esophageal cancer Neg Hx    Stomach cancer Neg Hx    Rectal cancer Neg Hx     Medications: Patient's Medications  New Prescriptions   No medications on file  Previous Medications   ACETAMINOPHEN (TYLENOL) 500 MG TABLET    Take 1-2 tablets (500-1,000 mg total) by mouth every 8 (eight) hours as needed (Max dose 3000 mg in 24 hours).   AMLODIPINE (NORVASC) 2.5 MG TABLET    Take 1 tablet (2.5 mg total) by mouth daily.   AZELASTINE (ASTELIN) 0.1 % NASAL SPRAY    Place 2 sprays into both nostrils 2 (two) times daily.   BACLOFEN (LIORESAL) 10 MG TABLET    Take 10 mg by mouth daily as needed for muscle spasms.   CALCIUM CITRATE 200 MG TABS    Take 200 mg of elemental calcium by mouth daily.   CARBIDOPA-LEVODOPA (SINEMET IR) 25-100 MG TABLET    Take as  needed for severe restless leg syndrome.   CHOLECALCIFEROL (VITAMIN D3) 25 MCG (1000 UNIT) TABLET    Take 1,000 Units by mouth daily.   CYCLOSPORINE (RESTASIS) 0.05 % OPHTHALMIC EMULSION    Place 1 drop into both eyes 2 (two) times daily.   DEXLANSOPRAZOLE (DEXILANT) 60 MG CAPSULE    Take 1 capsule (60 mg total) by mouth daily. Crack open the capsule and pour into spoon prior to ingestion.   ERENUMAB-AOOE (AIMOVIG, 140 MG DOSE,) 70  MG/ML SOAJ    Inject 70 mg into the skin every 30 (thirty) days.   ESTRADIOL (ESTRACE) 0.1 MG/GM VAGINAL CREAM    Place 1 Applicatorful vaginally 3 (three) times a week.   FERROUS SULFATE 325 (65 FE) MG TABLET    Take 650 mg by mouth daily with breakfast.   HYDROXYZINE (VISTARIL) 25 MG CAPSULE    Take 1 capsule (25 mg total) by mouth 2 (two) times daily as needed for anxiety.   IBUPROFEN (ADVIL) 200 MG TABLET    Take 400 mg by mouth every 6 (six) hours as needed for mild pain.   IMMUNE GLOBULIN, HUMAN, (GAMMAGARD S/D LESS IGA) 10 G INJECTION    Inject 2 g/kg into the vein every 21 ( twenty-one) days.   LAMOTRIGINE (LAMICTAL) 200 MG TABLET    Take 1 tablet (200 mg total) by mouth at bedtime.   LEVOTHYROXINE (SYNTHROID) 75 MCG TABLET    TAKE ONE (1) TABLET BY MOUTH EACH DAY 30MINUTES BEFORE BREAKFAST ON EMPTY STOMACH   LOPERAMIDE (IMODIUM A-D) 2 MG TABLET    Take 2 mg by mouth 4 (four) times daily as needed for diarrhea or loose stools.   MAGNESIUM OXIDE (MAG-OX) 400 MG TABLET    Take 400 mg by mouth daily.    MIRTAZAPINE (REMERON) 30 MG TABLET    Take 1 tablet (30 mg total) by mouth at bedtime.   OXYBUTYNIN (DITROPAN) 5 MG/5ML SYRUP    Take 2.5 mLs (2.5 mg total) by mouth 2 (two) times daily.   POTASSIUM CHLORIDE (KLOR-CON 10) 10 MEQ TABLET    Take 1 tablet (10 mEq total) by mouth 4 (four) times daily.   ROPINIROLE (REQUIP) 1 MG TABLET    Take 1.5 tablet 2-3 hour prior to bedtime.   SACCHAROMYCES BOULARDII (FLORASTOR) 250 MG CAPSULE    Take 1 capsule (250 mg total) by  mouth 2 (two) times daily.   SUCRALFATE (CARAFATE) 1 GM/10ML SUSPENSION    Take 20 mLs (2 g total) by mouth 3 (three) times daily as needed (gerd).   TAMSULOSIN (FLOMAX) 0.4 MG CAPS CAPSULE    Take 1 capsule (0.4 mg total) by mouth daily.   TIZANIDINE (ZANAFLEX) 4 MG TABLET    Take 1 tablet (4 mg total) by mouth every 6 (six) hours as needed for muscle spasms.   TORSEMIDE (DEMADEX) 20 MG TABLET    TAKE 1 TABLET BY MOUTH DAILY   TRIAMCINOLONE CREAM (KENALOG) 0.1 %    Apply 1 application topically 2 (two) times daily as needed (rash).   UNABLE TO FIND    Take 1 tablet by mouth daily. Med Name: [name] 1 by mouth daily for UTI prevention   VITAMIN B-12 (CYANOCOBALAMIN) 1000 MCG TABLET    Take 1 tablet (1,000 mcg total) by mouth daily.   ZINC OXIDE, TOPICAL, 10 % CREA    Apply 1 application topically 2 (two) times daily as needed (rash).  Modified Medications   No medications on file  Discontinued Medications   No medications on file    Physical Exam:  Vitals:   04/03/21 1124  BP: 110/68  Pulse: 90  Temp: (!) 97.3 F (36.3 C)  TempSrc: Temporal  SpO2: 99%  Weight: 126 lb 9.6 oz (57.4 kg)  Height: 5\' 2"  (1.575 m)   Body mass index is 23.16 kg/m. Wt Readings from Last 3 Encounters:  04/03/21 126 lb 9.6 oz (57.4 kg)  04/01/21 124 lb (56.2 kg)  03/10/21 119 lb (54 kg)  Physical Exam Vitals reviewed.  Constitutional:      General: She is not in acute distress.    Appearance: She is ill-appearing.  HENT:     Head: Normocephalic.  Cardiovascular:     Rate and Rhythm: Normal rate and regular rhythm.     Pulses: Normal pulses.     Heart sounds: Normal heart sounds. No murmur heard. Pulmonary:     Effort: Pulmonary effort is normal. No respiratory distress.     Breath sounds: Normal breath sounds. No wheezing.  Abdominal:     General: Bowel sounds are normal. There is no distension.     Palpations: Abdomen is soft.     Tenderness: There is no abdominal tenderness.   Musculoskeletal:     Right lower leg: Edema present.     Left lower leg: Edema present.     Comments: 1+ pitting, teds hose present  Skin:    General: Skin is warm and dry.     Capillary Refill: Capillary refill takes less than 2 seconds.  Neurological:     General: No focal deficit present.     Mental Status: She is alert and oriented to person, place, and time.     Motor: Weakness present.     Gait: Gait abnormal.     Comments: Wheelchair, 2 person assist  Psychiatric:        Mood and Affect: Mood normal.        Behavior: Behavior normal.    Labs reviewed: Basic Metabolic Panel: Recent Labs    06/10/20 1545 06/10/20 1549 01/14/21 1547 01/14/21 2158 01/15/21 0030 01/15/21 0500 01/16/21 0538 01/21/21 0339 01/22/21 0342 02/11/21 0444 02/12/21 0515 02/18/21 1035  NA  --    < > 135   < >  --  135   < > 138   < > 138 136 138  K  --    < > 2.8*   < >  --  4.0   < > 3.0*   < > 4.5 4.4 4.4  CL  --    < > 101   < >  --  106   < > 109   < > 109 105 98  CO2  --    < > 23   < >  --  25   < > 22   < > 24 24 26   GLUCOSE  --    < > 136*   < >  --  121*   < > 129*   < > 84 84 80  BUN  --    < > 23   < >  --  15   < > 14   < > 14 17 29*  CREATININE  --    < > 0.81   < >  --  0.65   < > 0.80   < > 0.75 0.95 0.97*  CALCIUM  --    < > 8.9   < >  --  8.1*   < > 7.9*   < > 8.6* 8.6* 10.0  MG  --    < > 1.4*  --   --  3.0*  --  1.9  --   --   --   --   PHOS  --   --   --   --  3.2 2.8  --   --   --   --   --   --   TSH 5.05*  --   --   --   --  2.990  --   --   --   --   --   --    < > = values in this interval not displayed.   Liver Function Tests: Recent Labs    01/15/21 0500 02/04/21 1711 02/05/21 0310  AST 33 35 36  ALT 29 32 31  ALKPHOS 56 78 66  BILITOT 0.4 0.3 0.4  PROT 6.5 8.1 6.5  ALBUMIN 2.9* 3.6 2.9*   Recent Labs    08/28/20 1426 12/09/20 1138  LIPASE 46 32   No results for input(s): AMMONIA in the last 8760 hours. CBC: Recent Labs    01/22/21 0342  01/29/21 1205 02/04/21 1711 02/08/21 0625 02/11/21 0444 02/12/21 0515 02/18/21 1035  WBC 5.0 9.0 6.2   < > 4.0 3.4* 8.8  NEUTROABS 3.3 6.0 4.3  --   --   --   --   HGB 10.7* 12.8 12.2   < > 10.9* 10.9* 13.5  HCT 32.7* 38.3 38.5   < > 34.3* 34.3* 40.5  MCV 89.8 88.7 93.0   < > 93.0 93.5 90.4  PLT 211 304.0 200   < > 143* 146* 271   < > = values in this interval not displayed.   Lipid Panel: No results for input(s): CHOL, HDL, LDLCALC, TRIG, CHOLHDL, LDLDIRECT in the last 8760 hours. TSH: Recent Labs    06/10/20 1545 01/15/21 0500  TSH 5.05* 2.990   A1C: No results found for: HGBA1C   Assessment/Plan 1. Chest pain, unspecified type - active chest pain within last week- radiates to left shoulder, no nausea/diaphoresis - appears ill - cardiology notified- advised to send to ED d/t active chest pain - EKG 12-Lead- LBBB (unchanged/ since 2015) ST depression noted today - advised patient and husband to present to ED for further evaluation  2. Weakness - began in July 2022 - ambulates with wheelchair, 2 person assist to exam table today - CBC with Differential/Platelet - CMP  Total time: 40 minutes. Greater than 50% of total time spend doing patient education on symptom management.     Next appt: none [facility], [facility] [phone]

## 2021-04-03 NOTE — ED Triage Notes (Signed)
Patient c/o intermittent mid chest pain that radiates into the left shoulder and left jaw x 1 week. Patient states one episode woke her up from a sleep a few days ago. Patient states intermittent episodes of SOB and worsens with chest pain.  Patient went went to her PCP and was instructed to come to the ED after having an abnormal EKG.

## 2021-04-04 LAB — COMPREHENSIVE METABOLIC PANEL
AG Ratio: 1.2 (calc) (ref 1.0–2.5)
ALT: 30 U/L — ABNORMAL HIGH (ref 6–29)
AST: 46 U/L — ABNORMAL HIGH (ref 10–35)
Albumin: 3.9 g/dL (ref 3.6–5.1)
Alkaline phosphatase (APISO): 101 U/L (ref 37–153)
BUN/Creatinine Ratio: 37 (calc) — ABNORMAL HIGH (ref 6–22)
BUN: 38 mg/dL — ABNORMAL HIGH (ref 7–25)
CO2: 25 mmol/L (ref 20–32)
Calcium: 10.1 mg/dL (ref 8.6–10.4)
Chloride: 104 mmol/L (ref 98–110)
Creat: 1.03 mg/dL — ABNORMAL HIGH (ref 0.60–1.00)
Globulin: 3.3 g/dL (calc) (ref 1.9–3.7)
Glucose, Bld: 49 mg/dL — ABNORMAL LOW (ref 65–139)
Potassium: 4.3 mmol/L (ref 3.5–5.3)
Sodium: 141 mmol/L (ref 135–146)
Total Bilirubin: 0.2 mg/dL (ref 0.2–1.2)
Total Protein: 7.2 g/dL (ref 6.1–8.1)

## 2021-04-04 LAB — CBC WITH DIFFERENTIAL/PLATELET
Absolute Monocytes: 1330 cells/uL — ABNORMAL HIGH (ref 200–950)
Basophils Absolute: 140 cells/uL (ref 0–200)
Basophils Relative: 0.8 %
Eosinophils Absolute: 123 cells/uL (ref 15–500)
Eosinophils Relative: 0.7 %
HCT: 42.3 % (ref 35.0–45.0)
Hemoglobin: 14.2 g/dL (ref 11.7–15.5)
Lymphs Abs: 6580 cells/uL — ABNORMAL HIGH (ref 850–3900)
MCH: 30 pg (ref 27.0–33.0)
MCHC: 33.6 g/dL (ref 32.0–36.0)
MCV: 89.4 fL (ref 80.0–100.0)
MPV: 10.4 fL (ref 7.5–12.5)
Monocytes Relative: 7.6 %
Neutro Abs: 9328 cells/uL — ABNORMAL HIGH (ref 1500–7800)
Neutrophils Relative %: 53.3 %
Platelets: 331 10*3/uL (ref 140–400)
RBC: 4.73 10*6/uL (ref 3.80–5.10)
RDW: 13.1 % (ref 11.0–15.0)
Total Lymphocyte: 37.6 %
WBC: 17.5 10*3/uL — ABNORMAL HIGH (ref 3.8–10.8)

## 2021-04-08 NOTE — Progress Notes (Signed)
Cardiology Office Note   Date:  04/10/2021   ID:  Batul, Diego 1946/04/02, MRN [id number]  PCP:  [name], NP  Cardiologist:   None   No chief complaint on file.     History of Present Illness: Megan Brewer is a 75 y.o. female who presents for evaluation of chest pain.  She was seen by Dr. Geraldo Pitter previously.   She has had many ED visits this year for various complaints.  She had a negative perfusion study in 2020.    She was in the hospital recently with sepsis.  We did see her at that time because she had an elevated troponin.She had a CT that ruled out a thoracic aneurysm or pulmonary embolism.  Echo was unremarkable.      Our plan was to potentially do a perfusion study as an outpatient.  She almost collapsed recently because of weakness.  She also had some chest pain.   She called into our office and was directed to go see her primary provider.  Because of the chest pain she was referred to the ER.  She was in the ED.    She had no acute EKG changes.  Her enzymes were negative.  I have reviewed both the emergency room records and the primary care records.  The patient has since figured out that although she is having the severe episodes of weakness, in fact today she is in a wheelchair, she has mitigated this somewhat by eating breakfast and then eating lunch and then taking her medications.  She has not had any frank syncope.  She has not had any new chest pressure, neck or arm discomfort.   Past Medical History:  Diagnosis Date   Anemia    Arachnoiditis    Bipolar disorder (HCC)    C. 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[place])    Lung cancer ([place]) 2011, 2014   Memory loss    Migraine    Opioid  abuse (HCC)    Osteoporosis    Presence of neurostimulator    Restless legs    SIRS (systemic inflammatory response syndrome) ([place])    Sleep apnea     Past Surgical History:  Procedure Laterality Date   ABDOMINAL HYSTERECTOMY     CARPAL TUNNEL RELEASE     CATARACT EXTRACTION Bilateral 2019   CHOLECYSTECTOMY     COLONOSCOPY  2015   [facility]   CT LUNG SCREENING  2018   DG  BONE DENSITY ([place] HX)  2018   DIAGNOSTIC MAMMOGRAM  2019   GASTRIC BYPASS     LUNG CANCER SURGERY  02/2010   MULTIPLE TOOTH EXTRACTIONS     PORT-A-CATH REMOVAL Left 02/07/2021   Procedure: REMOVAL PORT-A-CATH;  Surgeon: Mickeal Skinner, MD;  Location: [facility];  Service: General;  Laterality: Left;  60 MINUTES ROOM 4   SPINAL CORD STIMULATOR IMPLANT       Current Outpatient Medications  Medication Sig Dispense Refill   acetaminophen (TYLENOL) 500 MG tablet Take 1-2 tablets (500-1,000 mg total) by mouth every 8 (eight) hours as needed (Max dose 3000 mg in 24 hours).     acetaminophen (TYLENOL) 500 MG tablet Take  500 mg by mouth every 6 (six) hours as needed for moderate pain.     amLODipine (NORVASC) 2.5 MG tablet Take 1 tablet (2.5 mg total) by mouth daily. 90 tablet 1   azelastine (ASTELIN) 0.1 % nasal spray Place 2 sprays into both nostrils 2 (two) times daily. 30 mL 5   baclofen (LIORESAL) 10 MG tablet Take 10 mg by mouth daily as needed for muscle spasms.     Calcium Citrate 200 MG TABS Take 200 mg of elemental calcium by mouth daily.     carbidopa-levodopa (SINEMET IR) 25-100 MG tablet Take as needed for severe restless leg syndrome. 30 tablet 2   cycloSPORINE (RESTASIS) 0.05 % ophthalmic emulsion Place 1 drop into both eyes 2 (two) times daily.     dexlansoprazole (DEXILANT) 60 MG capsule Take 1 capsule (60 mg total) by mouth daily. Crack open the capsule and pour into spoon prior to ingestion. 30 capsule 11   Erenumab-aooe (AIMOVIG, 140 MG DOSE,) 70 MG/ML SOAJ Inject 70 mg into the skin every 30 (thirty)  days.     estradiol (ESTRACE) 0.1 MG/GM vaginal cream Place 1 Applicatorful vaginally 3 (three) times a week.     ferrous sulfate 325 (65 FE) MG tablet Take 650 mg by mouth daily with breakfast.     heparin lock flush 100 UNIT/ML SOLN injection Inject 500 Units into the vein every 21 ( twenty-one) days.     hydrOXYzine (VISTARIL) 25 MG capsule Take 1 capsule (25 mg total) by mouth 2 (two) times daily as needed for anxiety. 30 capsule 5   ibuprofen (ADVIL) 200 MG tablet Take 200 mg by mouth daily.     immune globulin, human, (GAMMAGARD S/D LESS IGA) 10 g injection Inject 2 g/kg into the vein every 21 ( twenty-one) days.     lamoTRIgine (LAMICTAL) 200 MG tablet Take 1 tablet (200 mg total) by mouth at bedtime. 90 tablet 1   levothyroxine (SYNTHROID) 75 MCG tablet TAKE ONE (1) TABLET BY MOUTH EACH DAY 30MINUTES BEFORE BREAKFAST ON EMPTY STOMACH 90 tablet 1   loperamide (IMODIUM A-D) 2 MG tablet Take 2 mg by mouth 4 (four) times daily as needed for diarrhea or loose stools.     magnesium oxide (MAG-OX) 400 MG tablet Take 400 mg by mouth daily.      mirtazapine (REMERON) 30 MG tablet Take 1 tablet (30 mg total) by mouth at bedtime. 90 tablet 1   oxybutynin (DITROPAN) 5 MG/5ML syrup Take 2.5 mLs (2.5 mg total) by mouth 2 (two) times daily. 473 mL 1   potassium chloride (KLOR-CON 10) 10 MEQ tablet Take 1 tablet (10 mEq total) by mouth 4 (four) times daily. (Patient taking differently: Take 40 mEq by mouth in the morning.) 120 tablet 1   rOPINIRole (REQUIP) 1 MG tablet Take 1.5 tablet 2-3 hour prior to bedtime. (Patient taking differently: Take 1 mg by mouth at bedtime.) 135 tablet 3   saccharomyces boulardii (FLORASTOR) 250 MG capsule Take 1 capsule (250 mg total) by mouth 2 (two) times daily. 30 capsule 3   sodium chloride, PF, 0.9 % injection Inject 5-10 mLs into the vein as needed (Immunoglobulin).     sucralfate (CARAFATE) 1 GM/10ML suspension Take 20 mLs (2 g total) by mouth 3 (three) times daily as  needed (gerd).     tamsulosin (FLOMAX) 0.4 MG CAPS capsule Take 1 capsule (0.4 mg total) by mouth daily. 30 capsule 1   tiZANidine (ZANAFLEX) 4 MG tablet Take 1 tablet (  4 mg total) by mouth every 6 (six) hours as needed for muscle spasms. (Patient taking differently: Take 12 mg by mouth at bedtime.) 10 tablet 0   torsemide (DEMADEX) 20 MG tablet TAKE 1 TABLET BY MOUTH DAILY 90 tablet 1   triamcinolone cream (KENALOG) 0.1 % Apply 1 application topically 2 (two) times daily as needed (rash).     UNABLE TO FIND Take 1 tablet by mouth daily. Med Name: [name] 1 by mouth daily for UTI prevention     vitamin B-12 (CYANOCOBALAMIN) 1000 MCG tablet TAKE 1 TABLET BY MOUTH DAILY     ZINC OXIDE, TOPICAL, 10 % CREA Apply 1 application topically 2 (two) times daily as needed (rash).     No current facility-administered medications for this visit.    Allergies:   Imitrex [sumatriptan], Tetracycline, Corticosteroids, Cefixime, Ciprofloxacin, Doxycycline, Gabapentin, Isometheptene-dichloral-apap, Ketorolac, Prednisone, Promethazine, Stadol [butorphanol], Erythromycin base, and Propoxyphene    ROS:  Please see the history of present illness.   Otherwise, review of systems are positive for none.   All other systems are reviewed and negative.    PHYSICAL EXAM: VS:  BP 128/66   Pulse 93   Ht 5' 2.5" (1.588 m)   Wt 127 lb (57.6 kg)   SpO2 98%   BMI 22.86 kg/m  , BMI Body mass index is 22.86 kg/m. GENERAL:  Well appearing NECK:  No jugular venous distention, waveform within normal limits, carotid upstroke brisk and symmetric, no bruits, no thyromegaly LUNGS:  Clear to auscultation bilaterally CHEST:  Unremarkable HEART:  PMI not displaced or sustained,S1 and S2 within normal limits, no S3, no S4, no clicks, no rubs, no murmurs ABD:  Flat, positive bowel sounds normal in frequency in pitch, no bruits, no rebound, no guarding, no midline pulsatile mass, no hepatomegaly, no splenomegaly EXT:  2 plus pulses  throughout, no edema, no cyanosis no clubbing  EKG:  EKG is ordered today. Sinus rhythm, rate 93, left bundle branch block, no acute ST-T wave changes, no change from previous.  Recent Labs: 01/15/2021: TSH 2.990 01/17/2021: B Natriuretic Peptide 44.8 01/21/2021: Magnesium 1.9 04/03/2021: ALT 30; BUN 40; Creatinine, Ser 1.07; Hemoglobin 14.8; Platelets 291; Potassium 5.1; Sodium 140    Lipid Panel    Component Value Date/Time   CHOL  01/27/2010 0550    144        ATP III CLASSIFICATION:  <200     mg/dL   Desirable  200-239  mg/dL   Borderline High  >=240    mg/dL   High          TRIG 68 01/27/2010 0550   HDL 58 01/27/2010 0550   CHOLHDL 2.5 01/27/2010 0550   VLDL 14 01/27/2010 0550   LDLCALC  01/27/2010 0550    72        Total Cholesterol/HDL:CHD Risk Coronary Heart Disease Risk Table                     Men   Women  1/2 Average Risk   3.4   3.3  Average Risk       5.0   4.4  2 X Average Risk   9.6   7.1  3 X Average Risk  23.4   11.0        Use the calculated Patient Ratio above and the CHD Risk Table to determine the patient's CHD Risk.        ATP III CLASSIFICATION (LDL):  <100  mg/dL   Optimal  100-129  mg/dL   Near or Above                    Optimal  130-159  mg/dL   Borderline  160-189  mg/dL   High  >190     mg/dL   Very High      Wt Readings from Last 3 Encounters:  04/09/21 127 lb (57.6 kg)  04/03/21 126 lb 9.6 oz (57.4 kg)  04/03/21 126 lb 9.6 oz (57.4 kg)      Other studies Reviewed: Additional studies/ records that were reviewed today include:   ED records. Review of the above records demonstrates:  Please see elsewhere in the note.     ASSESSMENT AND PLAN:  Chest pain: Her symptoms are mostly weakness and dizziness.  However, when she was in the hospital she did have some troponin elevation thought to be demand ischemia.  We had planned stress testing and I will go ahead and proceed with this.  Mild to Mod MR and TR: I will follow this up  with repeat echo in the future.  Hypertension : Her blood pressure has come up.  It was actually running low and I reduced her amlodipine at the last visit.  No change in therapy.  Current medicines are reviewed at length with the patient today.  The patient does not have concerns regarding medicines.  The following changes have been made: None  Labs/ tests ordered today include:    Orders Placed This Encounter  Procedures   MYOCARDIAL PERFUSION IMAGING   EKG 12-Lead      Disposition:   FU with in Sept has already been scheduled.    Signed, Minus Breeding, MD  04/10/2021 9:22 AM    [facility]

## 2021-04-09 ENCOUNTER — Encounter: Payer: Self-pay | Admitting: Cardiology

## 2021-04-09 ENCOUNTER — Other Ambulatory Visit: Payer: Self-pay

## 2021-04-09 ENCOUNTER — Ambulatory Visit: Payer: Medicare PPO | Admitting: Cardiology

## 2021-04-09 VITALS — BP 128/66 | HR 93 | Ht 62.5 in | Wt 127.0 lb

## 2021-04-09 DIAGNOSIS — R079 Chest pain, unspecified: Secondary | ICD-10-CM

## 2021-04-09 DIAGNOSIS — I340 Nonrheumatic mitral (valve) insufficiency: Secondary | ICD-10-CM

## 2021-04-09 DIAGNOSIS — I10 Essential (primary) hypertension: Secondary | ICD-10-CM

## 2021-04-09 DIAGNOSIS — R072 Precordial pain: Secondary | ICD-10-CM

## 2021-04-09 NOTE — Patient Instructions (Addendum)
Medication Instructions:  No Changes *If you need a refill on your cardiac medications before your next appointment, please call your pharmacy*   Lab Work: No Labs If you have labs (blood work) drawn today and your tests are completely normal, you will receive your results only by: [place] (if you have MyChart) OR A paper copy in the mail If you have any lab test that is abnormal or we need to change your treatment, we will call you to review the results.   Testing/Procedures: [place], [address] Your physician has requested that you have a lexiscan myoview. For further information please visit [email or web address]. Please follow instruction sheet, as given.    Follow-Up: At [facility], you and your health needs are our priority.  As part of our continuing mission to provide you with exceptional heart care, we have created designated Provider Care Teams.  These Care Teams include your primary Cardiologist (physician) and Advanced Practice Providers (APPs -  Physician Assistants and Nurse Practitioners) who all work together to provide you with the care you need, when you need it.  We recommend signing up for the patient portal called "MyChart".  Sign up information is provided on this After Visit Summary.  MyChart is used to connect with patients for Virtual Visits (Telemedicine).  Patients are able to view lab/test results, encounter notes, upcoming appointments, etc.  Non-urgent messages can be sent to your provider as well.   To learn more about what you can do with MyChart, go to [email or web address].    Your next appointment:   April 24, 2021 11:15 AM  The format for your next appointment:   In Person  Provider:   Caron Presume, PA-C

## 2021-04-10 ENCOUNTER — Encounter: Payer: Self-pay | Admitting: Cardiology

## 2021-04-16 ENCOUNTER — Ambulatory Visit: Payer: Medicare PPO

## 2021-04-17 HISTORY — PX: PORT A CATH REVISION: SHX6033

## 2021-04-18 ENCOUNTER — Ambulatory Visit: Payer: Medicare PPO

## 2021-04-24 ENCOUNTER — Ambulatory Visit: Payer: Medicare PPO | Admitting: Physician Assistant

## 2021-04-25 ENCOUNTER — Telehealth (HOSPITAL_COMMUNITY): Payer: Self-pay | Admitting: *Deleted

## 2021-04-25 ENCOUNTER — Other Ambulatory Visit: Payer: Self-pay

## 2021-04-25 ENCOUNTER — Ambulatory Visit (INDEPENDENT_AMBULATORY_CARE_PROVIDER_SITE_OTHER): Payer: Medicare PPO | Admitting: *Deleted

## 2021-04-25 DIAGNOSIS — M810 Age-related osteoporosis without current pathological fracture: Secondary | ICD-10-CM | POA: Diagnosis not present

## 2021-04-25 MED ORDER — DENOSUMAB 60 MG/ML ~~LOC~~ SOSY
60.0000 mg | PREFILLED_SYRINGE | Freq: Once | SUBCUTANEOUS | Status: AC
  Administered 2021-04-25: 60 mg via SUBCUTANEOUS

## 2021-04-25 NOTE — Telephone Encounter (Signed)
Close encounter 

## 2021-04-29 ENCOUNTER — Ambulatory Visit (HOSPITAL_COMMUNITY)
Admission: RE | Admit: 2021-04-29 | Discharge: 2021-04-29 | Disposition: A | Discharge status: Home / Self Care | Payer: Medicare PPO | Source: Ambulatory Visit | Attending: Internal Medicine | Admitting: Internal Medicine

## 2021-04-29 ENCOUNTER — Other Ambulatory Visit: Payer: Self-pay

## 2021-04-29 ENCOUNTER — Other Ambulatory Visit (HOSPITAL_BASED_OUTPATIENT_CLINIC_OR_DEPARTMENT_OTHER): Payer: Self-pay | Admitting: *Deleted

## 2021-04-29 DIAGNOSIS — R079 Chest pain, unspecified: Secondary | ICD-10-CM | POA: Diagnosis not present

## 2021-04-29 DIAGNOSIS — R072 Precordial pain: Secondary | ICD-10-CM

## 2021-04-29 LAB — MYOCARDIAL PERFUSION IMAGING
LV dias vol: 54 mL (ref 46–106)
LV sys vol: 8 mL
Nuc Stress EF: 85 %
Peak HR: 92 {beats}/min
Rest HR: 73 {beats}/min
Rest Nuclear Isotope Dose: 10.1 mCi
SDS: 0
SRS: 6
SSS: 6
ST Depression (mm): 0 mm
Stress Nuclear Isotope Dose: 29.1 mCi
TID: 0.79

## 2021-04-29 MED ORDER — TECHNETIUM TC 99M TETROFOSMIN IV KIT
10.1000 | PACK | Freq: Once | INTRAVENOUS | Status: AC | PRN
  Administered 2021-04-29: 10.1 via INTRAVENOUS
  Filled 2021-04-29: qty 11

## 2021-04-29 MED ORDER — TECHNETIUM TC 99M TETROFOSMIN IV KIT
29.1000 | PACK | Freq: Once | INTRAVENOUS | Status: AC | PRN
  Administered 2021-04-29: 29.1 via INTRAVENOUS
  Filled 2021-04-29: qty 30

## 2021-04-29 MED ORDER — REGADENOSON 0.4 MG/5ML IV SOLN
0.4000 mg | Freq: Once | INTRAVENOUS | Status: AC
  Administered 2021-04-29: 0.4 mg via INTRAVENOUS

## 2021-05-02 ENCOUNTER — Telehealth: Payer: Self-pay

## 2021-05-02 ENCOUNTER — Telehealth: Payer: Self-pay | Admitting: Physician Assistant

## 2021-05-02 NOTE — Telephone Encounter (Signed)
Called pt to further inquire about her symptoms and concerns. States she was admitted in June for [facility] and sepsis, d/c'd in July and felt fine. Then a few days later developed fatigue, headache, dyspnea on exertion. Denies cough or fever. States she is having normal bowel movements w/o abd pain, bloating or s/sx of rectal bleeding. Denies chest pain. Given her concerns, advised she will need to f/u with PCP to determine if her symptoms are COVID related, cardiac or pulmonary related. Advised if GI input is required, PCP will make our office aware. Verbalized acceptance and understanding.

## 2021-05-02 NOTE — Telephone Encounter (Signed)
Patient called states she can not eat anything and feels really weak. She said she has C-diff and is seeking advise because she is fine until she eats which could be anything.

## 2021-05-02 NOTE — Telephone Encounter (Signed)
Spoke with patient regarding her stress test results. Advised patient of the following:   Minus Breeding, MD  04/30/2021  6:37 PM EDT     Negative perfusion study.  No further testing.  Call Ms. Mcnorton with the results and send results to [name], NP   Patient states she is still very concerned because she is still having the same symptoms that she was having when she saw Dr. Percival Spanish. Patient states that she still feels pressure in her chest but denies any chest pain. Patient states that her blood pressure has been around the 140's and she often feels a lot of pressure in her head as well. Patient would like to know what she needs to do from here. Advised patient that she should reach out to her PCP and see if they have recommendations for her symptoms, patient would like to know what Dr. Percival Spanish recommends for her from this point forward. Advised I would forward to Dr. Percival Spanish for advice and review.   Made patient aware of ED precautions should new or worsening symptoms develop. Patient verbalized understanding.

## 2021-05-05 ENCOUNTER — Telehealth: Payer: Self-pay | Admitting: *Deleted

## 2021-05-05 NOTE — Telephone Encounter (Addendum)
Patient return call. RN gave information and schedule a follow up appointment  06/05/21--2:30 pm  with Dr Percival Spanish Patient verbalized understanding

## 2021-05-05 NOTE — Telephone Encounter (Signed)
Attempted to call patient to go over Dr. Rosezella [place] recommendations. Left message with her husband to have patient call back to office when able.

## 2021-05-05 NOTE — Telephone Encounter (Signed)
Open error 

## 2021-05-21 ENCOUNTER — Ambulatory Visit: Payer: Medicare PPO | Attending: Internal Medicine

## 2021-05-21 ENCOUNTER — Other Ambulatory Visit (HOSPITAL_BASED_OUTPATIENT_CLINIC_OR_DEPARTMENT_OTHER): Payer: Self-pay

## 2021-05-21 DIAGNOSIS — Z23 Encounter for immunization: Secondary | ICD-10-CM

## 2021-05-21 MED ORDER — INFLUENZA VAC A&B SA ADJ QUAD 0.5 ML IM PRSY
PREFILLED_SYRINGE | INTRAMUSCULAR | 0 refills | Status: DC
  Filled 2021-05-21: qty 0.5, 1d supply, fill #0

## 2021-05-21 NOTE — Progress Notes (Signed)
   Covid-19 Vaccination Clinic  Name:  Megan Brewer    MRN: [id number] DOB: 1945-11-23  05/21/2021  Megan Brewer was observed post Covid-19 immunization for 15 minutes without incident. She was provided with Vaccine Information Sheet and instruction to access the V-Safe system.   Megan Brewer was instructed to call 911 with any severe reactions post vaccine: Difficulty breathing  Swelling of face and throat  A fast heartbeat  A bad rash all over body  Dizziness and weakness

## 2021-05-23 ENCOUNTER — Other Ambulatory Visit: Payer: Self-pay | Admitting: Family Medicine

## 2021-05-26 DIAGNOSIS — D801 Nonfamilial hypogammaglobulinemia: Secondary | ICD-10-CM | POA: Diagnosis not present

## 2021-05-26 DIAGNOSIS — N1832 Chronic kidney disease, stage 3b: Secondary | ICD-10-CM | POA: Diagnosis not present

## 2021-05-26 DIAGNOSIS — Z452 Encounter for adjustment and management of vascular access device: Secondary | ICD-10-CM | POA: Diagnosis not present

## 2021-05-26 DIAGNOSIS — D803 Selective deficiency of immunoglobulin G [IgG] subclasses: Secondary | ICD-10-CM | POA: Diagnosis not present

## 2021-05-26 DIAGNOSIS — C3490 Malignant neoplasm of unspecified part of unspecified bronchus or lung: Secondary | ICD-10-CM | POA: Diagnosis not present

## 2021-05-28 ENCOUNTER — Encounter: Payer: Self-pay | Admitting: Psychiatry

## 2021-05-28 ENCOUNTER — Ambulatory Visit (INDEPENDENT_AMBULATORY_CARE_PROVIDER_SITE_OTHER): Payer: Medicare PPO | Admitting: Psychiatry

## 2021-05-28 ENCOUNTER — Other Ambulatory Visit: Payer: Self-pay

## 2021-05-28 DIAGNOSIS — F99 Mental disorder, not otherwise specified: Secondary | ICD-10-CM | POA: Diagnosis not present

## 2021-05-28 DIAGNOSIS — F5105 Insomnia due to other mental disorder: Secondary | ICD-10-CM | POA: Diagnosis not present

## 2021-05-28 DIAGNOSIS — F3162 Bipolar disorder, current episode mixed, moderate: Secondary | ICD-10-CM | POA: Diagnosis not present

## 2021-05-28 DIAGNOSIS — F419 Anxiety disorder, unspecified: Secondary | ICD-10-CM

## 2021-05-28 MED ORDER — LAMOTRIGINE 200 MG PO TABS: 200.0000 mg | ORAL_TABLET | Freq: Every day | ORAL | 1 refills | Status: DC

## 2021-05-28 MED ORDER — MIRTAZAPINE 30 MG PO TABS: 30.0000 mg | ORAL_TABLET | Freq: Every day | ORAL | 1 refills | Status: DC

## 2021-05-28 MED ORDER — HYDROXYZINE PAMOATE 25 MG PO CAPS: 25.0000 mg | ORAL_CAPSULE | Freq: Every day | ORAL | 5 refills | Status: DC | PRN

## 2021-05-28 NOTE — Progress Notes (Signed)
Megan Brewer [id number] Nov 25, 1945 75 y.o.  Subjective:   Patient ID:  Megan Brewer is a 75 y.o. (DOB 09-12-1945) female.  Chief Complaint:  Chief Complaint  Patient presents with   Medication Problem    Unsteadiness, tremor   Follow-up    Anxiety, mood disturbance, insomnia    HPI Megan Brewer presents to the office today for follow-up of anxiety, mood disturbance, and insomnia. She reports that she has difficulty walking and has periods of severe tremor and is concerned that this may be medication related.   She reports that if she accidentally does not take bedtime medications she sleeps better and feels better the following day. Reports sleep is "so,so" with medication. She reports that it is not unusual for her to awaken 4-6 times during the night.   She reports increased appetite. Denies depressed mood other than sadness that she has physical limitations. She reports low energy- "I feel like I am on a 3-day drunk." She reports that she is motivated to do things but is limited due to physical conditions. She reports that she has "an occasional rare episode of tremor." She reports some worry about long-term physical condition. Denies impulsivity or risky behavior. Denies SI.   She reports taking Hydroxyzine at bedtime.   Past Psychiatric Medication Trials: Sertraline- Was effective for about 25 years and no longer seems to be as effective. Prozac Paxil Cymbalta- may have caused psychosis. Remeron-Helpful for her mood and caused her legs to give out.  Depakote Trileptal- shaking and night sweats Gabapentin- Had throat swelling Lithium- Edema. Minimally effective Seroquel Risperidone Zyprexa- Effective for insomnia, anxiety, and mood. Excessive wt gain.  Saphris Lamotrigine Melatonin Ativan- Reports taking until 2 months ago. Valium- "great for my depression." Xanax Trazodone- Ineffective Requip- Took for years at 0.5 mg po qd. Ingrezza- multiple  side effects   AIMS    Flowsheet Row Office Visit from 02/25/2021 in [place] Office Visit from 06/14/2020 in [place] Visit from 06/23/2019 in [place] Total Score 5 15 0      PHQ2-9    [place] Office Visit from 02/04/2021 in [facility] Clinical Support from 09/27/2020 in [place] Visit from 06/10/2020 in [place] Visit from 09/25/2019 in [place] Visit from 07/05/2019 in [place]  PHQ-2 Total Score 0 0 0 0 0      [place] ED from 04/03/2021 in [place] DEPT Admission (Discharged) from 02/04/2021 in [place] ED to Hosp-Admission (Discharged) from 01/14/2021 in [place]  C-SSRS RISK CATEGORY No Risk No Risk No Risk        Review of Systems:  Review of Systems  Gastrointestinal:        Improved diarrhea  Musculoskeletal:  Positive for gait problem.  Neurological:  Positive for tremors and weakness.  Psychiatric/Behavioral:         Please refer to HPI   Medications: I have reviewed the patient's current medications.  Current Outpatient Medications  Medication Sig Dispense Refill   acetaminophen (TYLENOL) 500 MG tablet Take 1-2 tablets (500-1,000 mg total) by mouth every 8 (eight) hours as needed (Max dose 3000 mg in 24 hours).     acetaminophen (TYLENOL) 500 MG tablet Take 500 mg by mouth every 6 (six) hours as needed for moderate pain.  azelastine (ASTELIN) 0.1 % nasal spray Place 2 sprays into both nostrils 2 (two) times daily. 30 mL 5   baclofen (LIORESAL) 10 MG tablet Take 10 mg by mouth daily as needed for muscle spasms.     Calcium Citrate 200 MG TABS Take 200 mg of elemental calcium by mouth daily.     cholecalciferol (VITAMIN D3) 25 MCG (1000 UNIT) tablet Take 1,000 Units  by mouth daily.     cycloSPORINE (RESTASIS) 0.05 % ophthalmic emulsion Place 1 drop into both eyes 2 (two) times daily.     dexlansoprazole (DEXILANT) 60 MG capsule Take 1 capsule (60 mg total) by mouth daily. Crack open the capsule and pour into spoon prior to ingestion. 30 capsule 11   Erenumab-aooe (AIMOVIG, 140 MG DOSE,) 70 MG/ML SOAJ Inject 70 mg into the skin every 30 (thirty) days.     estradiol (ESTRACE) 0.1 MG/GM vaginal cream Place 1 Applicatorful vaginally 3 (three) times a week.     ferrous sulfate 325 (65 FE) MG tablet Take 650 mg by mouth daily with breakfast.     ibuprofen (ADVIL) 200 MG tablet Take 200 mg by mouth daily.     immune globulin, human, (GAMMAGARD S/D LESS IGA) 10 g injection Inject 2 g/kg into the vein every 21 ( twenty-one) days.     levothyroxine (SYNTHROID) 75 MCG tablet TAKE ONE (1) TABLET BY MOUTH EACH DAY 30MINUTES BEFORE BREAKFAST ON EMPTY STOMACH 90 tablet 1   loperamide (IMODIUM A-D) 2 MG tablet Take 2 mg by mouth 4 (four) times daily as needed for diarrhea or loose stools.     magnesium oxide (MAG-OX) 400 MG tablet Take 400 mg by mouth daily.      melatonin 5 MG TABS Take 5 mg by mouth.     oxybutynin (DITROPAN) 5 MG/5ML syrup Take 2.5 mLs (2.5 mg total) by mouth 2 (two) times daily. 473 mL 1   potassium chloride (KLOR-CON) 10 MEQ tablet TAKE ONE (1) TABLET BY MOUTH FOUR (4) TIMES DAILY 120 tablet 1   rOPINIRole (REQUIP) 1 MG tablet Take 1.5 tablet 2-3 hour prior to bedtime. (Patient taking differently: Take 1 mg by mouth at bedtime.) 135 tablet 3   saccharomyces boulardii (FLORASTOR) 250 MG capsule Take 1 capsule (250 mg total) by mouth 2 (two) times daily. 30 capsule 3   sucralfate (CARAFATE) 1 GM/10ML suspension Take 20 mLs (2 g total) by mouth 3 (three) times daily as needed (gerd).     tamsulosin (FLOMAX) 0.4 MG CAPS capsule Take 1 capsule (0.4 mg total) by mouth daily. 30 capsule 1   torsemide (DEMADEX) 20 MG tablet TAKE 1 TABLET BY MOUTH DAILY 90 tablet  1   UNABLE TO FIND Take 1 tablet by mouth daily. Med Name: [name] 1 by mouth daily for UTI prevention     vitamin B-12 (CYANOCOBALAMIN) 1000 MCG tablet TAKE 1 TABLET BY MOUTH DAILY     amLODipine (NORVASC) 2.5 MG tablet Take 1 tablet (2.5 mg total) by mouth daily. 90 tablet 1   carbidopa-levodopa (SINEMET IR) 25-100 MG tablet Take as needed for severe restless leg syndrome. 30 tablet 2   heparin lock flush 100 UNIT/ML SOLN injection Inject 500 Units into the vein every 21 ( twenty-one) days.     hydrOXYzine (VISTARIL) 25 MG capsule Take 1 capsule (25 mg total) by mouth daily as needed (panic). 30 capsule 5   influenza vaccine adjuvanted (FLUAD) 0.5 ML injection Inject into the muscle. 0.5 mL 0  lamoTRIgine (LAMICTAL) 200 MG tablet Take 1 tablet (200 mg total) by mouth at bedtime. 90 tablet 1   mirtazapine (REMERON) 30 MG tablet Take 1 tablet (30 mg total) by mouth at bedtime. 90 tablet 1   sodium chloride, PF, 0.9 % injection Inject 5-10 mLs into the vein as needed (Immunoglobulin).     tiZANidine (ZANAFLEX) 4 MG tablet Take 1 tablet (4 mg total) by mouth every 6 (six) hours as needed for muscle spasms. (Patient not taking: Reported on 05/28/2021) 10 tablet 0   triamcinolone cream (KENALOG) 0.1 % Apply 1 application topically 2 (two) times daily as needed (rash).     ZINC OXIDE, TOPICAL, 10 % CREA Apply 1 application topically 2 (two) times daily as needed (rash).     No current facility-administered medications for this visit.    Medication Side Effects: Other: Possible tremor and unsteady gait  Allergies:  Allergies  Allergen Reactions   Imitrex [Sumatriptan] Nausea And Vomiting    Ringing in the ears, migraines are worse   Tetracycline Nausea Only    Causes ringing in ears, dizziness, migraine, nausea   Corticosteroids Other (See Comments)    12/02/2016 interview by CWC: Oral dosing causes migraines/nausea/tinnitus. Prev tolerated IV and intranasal admin w/o difficulty.   Cefixime  Other (See Comments)    Headache    Ciprofloxacin Other (See Comments)    Headache, dizziness, ringing in the ears   Doxycycline Other (See Comments)    unknown   Gabapentin     Throat swells/closes   Isometheptene-Dichloral-Apap Other (See Comments)    unknown   Ketorolac     12/02/2016 interview by BJS: Oral dosing prednisone/corticosteroids causes migraines/nausea/tinnitus. Prev tolerated IV and intranasal admin w/o difficulty.   Prednisone Other (See Comments)    Migraine, dizzy, ringing in ears-can take it IV 12/02/2016 interview by [name]: Oral prednisone dosing causes migraines/nausea/tinnitus. Prev tolerated IV and intranasal admin w/o difficulty.   Promethazine Other (See Comments)    causes severe abdominal pain when taken IV; however she can take PO or IM   Stadol [Butorphanol]     Cerebral pain   Erythromycin Base Diarrhea and Itching    Severe diarrhea   Propoxyphene Nausea Only    Past Medical History:  Diagnosis Date   Anemia    Arachnoiditis    Bipolar disorder (HCC)    C. 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   Please see review of systems for further details on the patient's review from today.   Objective:   Physical Exam:  There were no vitals taken for this visit.  Physical Exam Constitutional:      General: She is not in acute distress. Musculoskeletal:      Comments: Slowed gait  Neurological:     Mental Status: She is  alert and oriented to person, place, and time.     Coordination: Coordination normal.  Psychiatric:        Attention and Perception: Attention and perception normal. She does not perceive auditory or visual hallucinations.        Mood and Affect: Mood normal. Mood is not anxious or depressed. Affect is not labile, blunt, angry or inappropriate.        Speech: Speech normal.        Behavior: Behavior normal.        Thought Content: Thought content normal. Thought content is not paranoid or delusional. Thought content does not include homicidal or suicidal ideation. Thought content does not include homicidal or suicidal plan.        Cognition and Memory: Cognition and memory normal.        Judgment: Judgment normal.     Comments: Insight intact    Lab Review:     Component Value Date/Time   NA 140 04/03/2021 1332   K 5.1 04/03/2021 1332   CL 104 04/03/2021 1332   CO2 26 04/03/2021 1332   GLUCOSE 80 04/03/2021 1332   BUN 40 (H) 04/03/2021 1332   CREATININE 1.07 (H) 04/03/2021 1332   CREATININE 1.03 (H) 04/03/2021 1207   CALCIUM 10.4 (H) 04/03/2021 1332   PROT 8.5 (H) 04/03/2021 1321   ALBUMIN 4.0 04/03/2021 1321   AST 62 (H) 04/03/2021 1321   ALT 30 04/03/2021 1321   ALKPHOS 93 04/03/2021 1321   BILITOT 0.2 (L) 04/03/2021 1321   GFRNONAA 55 (L) 04/03/2021 1332   GFRNONAA 33 (L) 06/10/2020 1549   GFRAA 38 (L) 06/10/2020 1549       Component Value Date/Time   WBC 11.2 (H) 04/03/2021 1332   RBC 4.99 04/03/2021 1332   HGB 14.8 04/03/2021 1332   HCT 45.6 04/03/2021 1332   PLT 291 04/03/2021 1332   MCV 91.4 04/03/2021 1332   MCH 29.7 04/03/2021 1332   MCHC 32.5 04/03/2021 1332   RDW 13.7 04/03/2021 1332   LYMPHSABS 6,580 (H) 04/03/2021 1207   MONOABS 0.6 02/04/2021 1711   EOSABS 123 04/03/2021 1207   BASOSABS 140 04/03/2021 1207    No results found for: POCLITH, LITHIUM   No results found for: PHENYTOIN,  PHENOBARB, VALPROATE, CBMZ   .res Assessment: Plan:    Pt seen for 30 minutes and time spent discussing her concerns about possible medication side effects and discussing which medications could potentially contribute to these side effects. Pt reports that she has been taking Hydroxyzine nightly and discussed that regular use of Hydroxyzine could cause some unsteadiness, particularly when combined with other medications. Recommended taking Hydroxyzine only as needed for panic attacks and no longer taking it every night to minimize risk of side effects, particularly since she has been sleeping well with Remeron and Melatonin. Continue Remeron 30 mg po QHS since this has been helpful for her mood, anxiety, and insomnia.  Continue Lamictal 200 mg po qd for mood stabilization.  Pt to follow-up in 3 months or sooner if clinically indicated.  Patient advised to contact office with any questions, adverse effects, or acute worsening in signs and symptoms.   Megan Brewer was seen today for medication problem and follow-up.  Diagnoses and all orders for this visit:  Bipolar disorder, current episode mixed, moderate (HCC) -     lamoTRIgine (LAMICTAL) 200 MG tablet; Take 1 tablet (200 mg total) by mouth at bedtime.  Insomnia due to other mental disorder -  mirtazapine (REMERON) 30 MG tablet; Take 1 tablet (30 mg total) by mouth at bedtime. -     hydrOXYzine (VISTARIL) 25 MG capsule; Take 1 capsule (25 mg total) by mouth daily as needed (panic).  Anxiety disorder, unspecified type -     mirtazapine (REMERON) 30 MG tablet; Take 1 tablet (30 mg total) by mouth at bedtime. -     hydrOXYzine (VISTARIL) 25 MG capsule; Take 1 capsule (25 mg total) by mouth daily as needed (panic).    Please see After Visit Summary for patient specific instructions.  Future Appointments  Date Time Provider [place]  06/05/2021  2:30 PM Minus Breeding, MD [facility]  07/14/2021 10:30 AM Narda Amber K,  DO [facility] None  08/28/2021 11:30 AM Thayer Headings, PMHNP CP[facility] None  09/30/2021 11:00 AM [name], NP [facility] None  10/24/2021 11:15 AM [facility] NURSE [facility] None    No orders of the defined types were placed in this encounter.   -------------------------------

## 2021-05-30 ENCOUNTER — Other Ambulatory Visit (HOSPITAL_BASED_OUTPATIENT_CLINIC_OR_DEPARTMENT_OTHER): Payer: Self-pay

## 2021-05-30 MED ORDER — COVID-19MRNA BIVAL VACC PFIZER 30 MCG/0.3ML IM SUSP
INTRAMUSCULAR | 0 refills | Status: DC
  Filled 2021-05-30: qty 0.3, 1d supply, fill #0

## 2021-06-04 NOTE — Progress Notes (Signed)
Cardiology Office Note   Date:  06/05/2021   ID:  Megan Brewer, Megan Brewer [birth year], MRN [id number]  PCP:  [name], NP  Cardiologist:   None   Chief Complaint  Patient presents with   Weakness       History of Present Illness: Megan Brewer is a 75 y.o. female who presents for evaluation of chest pain.  She was seen by Dr. Geraldo Pitter previously.   She has had many ED visits this year for various complaints.  She had a negative perfusion study in 2020.    She was in the hospital in earlier this year with sepsis.  We did see her at that time because she had an elevated troponin.She had a CT that ruled out a thoracic aneurysm or pulmonary embolism.  Echo was unremarkable.      Our plan was to potentially do a perfusion study as an outpatient.  However, I saw her back in the office in the summer after she almost collapsed because of weakness.   She figured out that this was related to some degree by the way she was taking her medications with her meals.    She has not been in the hospital since I last saw her but she continues to have significant weakness.  She is dyspneic with exertion.  She has not had any new PND or orthopnea.  She does have a little lower extremity swelling.  Her blood pressures have been down having been up previously.  We actually have been weaning down her amlodipine.  We had actually discontinued it somewhere along the line but she still been taking this.  She has not been having any new syncope or presyncope but she still does get dizzy with standing.   Past Medical History:  Diagnosis Date   Anemia    Arachnoiditis    Bipolar disorder (HCC)    C. 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[place])     Lung cancer ([place]) 2011, 2014   Memory loss    Migraine    Opioid abuse (HCC)    Osteoporosis    Presence of neurostimulator    Restless legs    SIRS (systemic inflammatory response syndrome) ([place])    Sleep apnea     Past Surgical History:  Procedure Laterality Date   ABDOMINAL HYSTERECTOMY     CARPAL TUNNEL RELEASE     CATARACT EXTRACTION Bilateral 2019   CHOLECYSTECTOMY     COLONOSCOPY  2015   [facility]   CT LUNG SCREENING  2018   DG  BONE DENSITY ([place] HX)  2018   DIAGNOSTIC MAMMOGRAM  2019   GASTRIC BYPASS     LUNG CANCER SURGERY  02/2010   MULTIPLE TOOTH EXTRACTIONS     PORT-A-CATH REMOVAL Left 02/07/2021   Procedure: REMOVAL PORT-A-CATH;  Surgeon: Mickeal Skinner, MD;  Location: [facility];  Service: General;  Laterality: Left;  60 MINUTES ROOM 4   SPINAL CORD STIMULATOR IMPLANT       Current Outpatient Medications  Medication Sig Dispense Refill   acetaminophen (TYLENOL) 500 MG tablet Take 1-2 tablets (500-1,000 mg total) by mouth every 8 (eight) hours as needed (Max dose 3000 mg  in 24 hours).     acetaminophen (TYLENOL) 500 MG tablet Take 500 mg by mouth every 6 (six) hours as needed for moderate pain.     azelastine (ASTELIN) 0.1 % nasal spray Place 2 sprays into both nostrils 2 (two) times daily. 30 mL 5   baclofen (LIORESAL) 10 MG tablet Take 10 mg by mouth daily as needed for muscle spasms.     Calcium Citrate 200 MG TABS Take 200 mg of elemental calcium by mouth daily.     carbidopa-levodopa (SINEMET IR) 25-100 MG tablet Take as needed for severe restless leg syndrome. 30 tablet 2   cholecalciferol (VITAMIN D3) 25 MCG (1000 UNIT) tablet Take 1,000 Units by mouth daily.     COVID-19 mRNA bivalent vaccine, Pfizer, injection Inject into the muscle. 0.3 mL 0   cycloSPORINE (RESTASIS) 0.05 % ophthalmic emulsion Place 1 drop into both eyes 2 (two) times daily.     dexlansoprazole (DEXILANT) 60 MG capsule Take 1 capsule (60 mg total) by mouth daily. Crack open the capsule  and pour into spoon prior to ingestion. 30 capsule 11   Erenumab-aooe (AIMOVIG, 140 MG DOSE,) 70 MG/ML SOAJ Inject 70 mg into the skin every 30 (thirty) days.     estradiol (ESTRACE) 0.1 MG/GM vaginal cream Place 1 Applicatorful vaginally 3 (three) times a week.     ferrous sulfate 325 (65 FE) MG tablet Take 650 mg by mouth daily with breakfast.     heparin lock flush 100 UNIT/ML SOLN injection Inject 500 Units into the vein every 21 ( twenty-one) days.     hydrOXYzine (VISTARIL) 25 MG capsule Take 1 capsule (25 mg total) by mouth daily as needed (panic). 30 capsule 5   ibuprofen (ADVIL) 200 MG tablet Take 200 mg by mouth daily.     immune globulin, human, (GAMMAGARD S/D LESS IGA) 10 g injection Inject 2 g/kg into the vein every 21 ( twenty-one) days.     influenza vaccine adjuvanted (FLUAD) 0.5 ML injection Inject into the muscle. 0.5 mL 0   lamoTRIgine (LAMICTAL) 200 MG tablet Take 1 tablet (200 mg total) by mouth at bedtime. 90 tablet 1   levothyroxine (SYNTHROID) 75 MCG tablet TAKE ONE (1) TABLET BY MOUTH EACH DAY 30MINUTES BEFORE BREAKFAST ON EMPTY STOMACH 90 tablet 1   loperamide (IMODIUM A-D) 2 MG tablet Take 2 mg by mouth 4 (four) times daily as needed for diarrhea or loose stools.     magnesium oxide (MAG-OX) 400 MG tablet Take 400 mg by mouth daily.      melatonin 5 MG TABS Take 5 mg by mouth.     mirtazapine (REMERON) 30 MG tablet Take 1 tablet (30 mg total) by mouth at bedtime. 90 tablet 1   oxybutynin (DITROPAN) 5 MG/5ML syrup Take 2.5 mLs (2.5 mg total) by mouth 2 (two) times daily. 473 mL 1   potassium chloride (KLOR-CON) 10 MEQ tablet TAKE ONE (1) TABLET BY MOUTH FOUR (4) TIMES DAILY 120 tablet 1   rOPINIRole (REQUIP) 1 MG tablet Take 1.5 tablet 2-3 hour prior to bedtime. (Patient taking differently: Take 1 mg by mouth at bedtime.) 135 tablet 3   saccharomyces boulardii (FLORASTOR) 250 MG capsule Take 1 capsule (250 mg total) by mouth 2 (two) times daily. 30 capsule 3   sodium  chloride, PF, 0.9 % injection Inject 5-10 mLs into the vein as needed (Immunoglobulin).     sucralfate (CARAFATE) 1 GM/10ML suspension Take 20 mLs (2 g total) by mouth 3 (  three) times daily as needed (gerd).     tamsulosin (FLOMAX) 0.4 MG CAPS capsule Take 1 capsule (0.4 mg total) by mouth daily. 30 capsule 1   torsemide (DEMADEX) 20 MG tablet TAKE 1 TABLET BY MOUTH DAILY 90 tablet 1   triamcinolone cream (KENALOG) 0.1 % Apply 1 application topically 2 (two) times daily as needed (rash).     UNABLE TO FIND Take 1 tablet by mouth daily. Med Name: [name] 1 by mouth daily for UTI prevention     vitamin B-12 (CYANOCOBALAMIN) 1000 MCG tablet TAKE 1 TABLET BY MOUTH DAILY     ZINC OXIDE, TOPICAL, 10 % CREA Apply 1 application topically 2 (two) times daily as needed (rash).     tiZANidine (ZANAFLEX) 4 MG tablet Take 1 tablet (4 mg total) by mouth every 6 (six) hours as needed for muscle spasms. (Patient not taking: No sig reported) 10 tablet 0   No current facility-administered medications for this visit.    Allergies:   Imitrex [sumatriptan], Tetracycline, Corticosteroids, Cefixime, Ciprofloxacin, Doxycycline, Gabapentin, Isometheptene-dichloral-apap, Ketorolac, Prednisone, Promethazine, Stadol [butorphanol], Tape, Erythromycin base, and Propoxyphene    ROS:  Please see the history of present illness.   Otherwise, review of systems are positive for none.   All other systems are reviewed and negative.    PHYSICAL EXAM: VS:  BP 90/60 (BP Location: Left Arm)   Pulse 83   Ht 5' 2.5" (1.588 m)   Wt 130 lb (59 kg)   SpO2 (!) 81%   BMI 23.40 kg/m  , BMI Body mass index is 23.4 kg/m. GENERAL:  Well appearing NECK:  No jugular venous distention, waveform within normal limits, carotid upstroke brisk and symmetric, no bruits, no thyromegaly LUNGS:  Clear to auscultation bilaterally CHEST:  Unremarkable HEART:  PMI not displaced or sustained,S1 and S2 within normal limits, no S3, no S4, no clicks, no  rubs, soft apical systolic murmur nonradiating, no diastolic murmurs ABD:  Flat, positive bowel sounds normal in frequency in pitch, no bruits, no rebound, no guarding, no midline pulsatile mass, no hepatomegaly, no splenomegaly EXT:  2 plus pulses throughout, no edema, no cyanosis no clubbing   EKG:  EKG is not ordered today.   Recent Labs: 01/15/2021: TSH 2.990 01/17/2021: B Natriuretic Peptide 44.8 01/21/2021: Magnesium 1.9 04/03/2021: ALT 30; BUN 40; Creatinine, Ser 1.07; Hemoglobin 14.8; Platelets 291; Potassium 5.1; Sodium 140    Lipid Panel    Component Value Date/Time   CHOL  01/27/2010 0550    144        ATP III CLASSIFICATION:  <200     mg/dL   Desirable  200-239  mg/dL   Borderline High  >=240    mg/dL   High          TRIG 68 01/27/2010 0550   HDL 58 01/27/2010 0550   CHOLHDL 2.5 01/27/2010 0550   VLDL 14 01/27/2010 0550   LDLCALC  01/27/2010 0550    72        Total Cholesterol/HDL:CHD Risk Coronary Heart Disease Risk Table                     Men   Women  1/2 Average Risk   3.4   3.3  Average Risk       5.0   4.4  2 X Average Risk   9.6   7.1  3 X Average Risk  23.4   11.0        Use  the calculated Patient Ratio above and the CHD Risk Table to determine the patient's CHD Risk.        ATP III CLASSIFICATION (LDL):  <100     mg/dL   Optimal  100-129  mg/dL   Near or Above                    Optimal  130-159  mg/dL   Borderline  160-189  mg/dL   High  >190     mg/dL   Very High      Wt Readings from Last 3 Encounters:  06/05/21 130 lb (59 kg)  04/29/21 127 lb (57.6 kg)  04/09/21 127 lb (57.6 kg)      Other studies Reviewed: Additional studies/ records that were reviewed today include:  Labs Review of the above records demonstrates:  Please see elsewhere in the note.     ASSESSMENT AND PLAN:  Chest pain:   She had a negative perfusion study.  Has had no change in her chronic complaints of some chest discomfort.  No further work-up is suggested.    Mild to Mod MR and TR: I will likely follow an echocardiogram in 1 year.   Hypertension : Her blood pressure has been slowly dropping.  I will stop her amlodipine.   SOB: She requests referral to pulmonary which I think is reasonable.  Weakness and dizziness: I think this is multifactorial but we will see if she does not better coming off the amlodipine.  Current medicines are reviewed at length with the patient today.  The patient does not have concerns regarding medicines.  The following changes have been made: None  Labs/ tests ordered today include:    Orders Placed This Encounter  Procedures   Ambulatory referral to Pulmonology      Disposition:   FU with APP in 3 months.    Signed, Minus Breeding, MD  06/05/2021 3:31 PM    [facility]

## 2021-06-05 ENCOUNTER — Ambulatory Visit: Payer: Medicare PPO | Admitting: Cardiology

## 2021-06-05 ENCOUNTER — Encounter: Payer: Self-pay | Admitting: Cardiology

## 2021-06-05 ENCOUNTER — Other Ambulatory Visit: Payer: Self-pay

## 2021-06-05 VITALS — BP 90/60 | HR 83 | Ht 62.5 in | Wt 130.0 lb

## 2021-06-05 DIAGNOSIS — R0602 Shortness of breath: Secondary | ICD-10-CM | POA: Diagnosis not present

## 2021-06-05 DIAGNOSIS — R0609 Other forms of dyspnea: Secondary | ICD-10-CM | POA: Insufficient documentation

## 2021-06-05 DIAGNOSIS — I10 Essential (primary) hypertension: Secondary | ICD-10-CM | POA: Diagnosis not present

## 2021-06-05 DIAGNOSIS — I340 Nonrheumatic mitral (valve) insufficiency: Secondary | ICD-10-CM | POA: Diagnosis not present

## 2021-06-05 DIAGNOSIS — R072 Precordial pain: Secondary | ICD-10-CM

## 2021-06-05 NOTE — Patient Instructions (Addendum)
Medication Instructions:  STOP the Amlodipine  *If you need a refill on your cardiac medications before your next appointment, please call your pharmacy*   Lab Work: None ordered If you have labs (blood work) drawn today and your tests are completely normal, you will receive your results only by: [place] (if you have MyChart) OR A paper copy in the mail If you have any lab test that is abnormal or we need to change your treatment, we will call you to review the results.   Testing/Procedures: None ordered   Follow-Up: At [facility], you and your health needs are our priority.  As part of our continuing mission to provide you with exceptional heart care, we have created designated Provider Care Teams.  These Care Teams include your primary Cardiologist (physician) and Advanced Practice Providers (APPs -  Physician Assistants and Nurse Practitioners) who all work together to provide you with the care you need, when you need it.  We recommend signing up for the patient portal called "MyChart".  Sign up information is provided on this After Visit Summary.  MyChart is used to connect with patients for Virtual Visits (Telemedicine).  Patients are able to view lab/test results, encounter notes, upcoming appointments, etc.  Non-urgent messages can be sent to your provider as well.   To learn more about what you can do with MyChart, go to [email or web address].    Your next appointment:   4 month(s)  The format for your next appointment:   In Person  Provider:   You will see one of the following Advanced Practice Providers on your designated Care Team:   Rosaria Ferries, PA-C Caron Presume, PA-C Jory Sims, DNP,   A referral has been placed to Pulmonology

## 2021-06-17 HISTORY — PX: DENTAL SURGERY: SHX609

## 2021-06-23 ENCOUNTER — Encounter (HOSPITAL_BASED_OUTPATIENT_CLINIC_OR_DEPARTMENT_OTHER): Payer: Self-pay

## 2021-06-23 ENCOUNTER — Emergency Department (HOSPITAL_BASED_OUTPATIENT_CLINIC_OR_DEPARTMENT_OTHER)
Admission: EM | Admit: 2021-06-23 | Discharge: 2021-06-23 | Disposition: A | Discharge status: Home / Self Care | Payer: Medicare PPO | Attending: Emergency Medicine | Admitting: Emergency Medicine

## 2021-06-23 ENCOUNTER — Emergency Department (HOSPITAL_BASED_OUTPATIENT_CLINIC_OR_DEPARTMENT_OTHER): Payer: Medicare PPO

## 2021-06-23 ENCOUNTER — Other Ambulatory Visit: Payer: Self-pay

## 2021-06-23 DIAGNOSIS — M47812 Spondylosis without myelopathy or radiculopathy, cervical region: Secondary | ICD-10-CM | POA: Diagnosis not present

## 2021-06-23 DIAGNOSIS — R221 Localized swelling, mass and lump, neck: Secondary | ICD-10-CM | POA: Diagnosis not present

## 2021-06-23 DIAGNOSIS — Z85118 Personal history of other malignant neoplasm of bronchus and lung: Secondary | ICD-10-CM | POA: Diagnosis not present

## 2021-06-23 DIAGNOSIS — Z79899 Other long term (current) drug therapy: Secondary | ICD-10-CM | POA: Diagnosis not present

## 2021-06-23 DIAGNOSIS — I6529 Occlusion and stenosis of unspecified carotid artery: Secondary | ICD-10-CM | POA: Diagnosis not present

## 2021-06-23 DIAGNOSIS — L0211 Cutaneous abscess of neck: Secondary | ICD-10-CM | POA: Diagnosis not present

## 2021-06-23 DIAGNOSIS — N189 Chronic kidney disease, unspecified: Secondary | ICD-10-CM | POA: Diagnosis not present

## 2021-06-23 DIAGNOSIS — E039 Hypothyroidism, unspecified: Secondary | ICD-10-CM | POA: Diagnosis not present

## 2021-06-23 DIAGNOSIS — I129 Hypertensive chronic kidney disease with stage 1 through stage 4 chronic kidney disease, or unspecified chronic kidney disease: Secondary | ICD-10-CM | POA: Diagnosis not present

## 2021-06-23 DIAGNOSIS — R6884 Jaw pain: Secondary | ICD-10-CM | POA: Diagnosis not present

## 2021-06-23 DIAGNOSIS — G8918 Other acute postprocedural pain: Secondary | ICD-10-CM | POA: Insufficient documentation

## 2021-06-23 DIAGNOSIS — A419 Sepsis, unspecified organism: Secondary | ICD-10-CM | POA: Diagnosis not present

## 2021-06-23 LAB — COMPREHENSIVE METABOLIC PANEL
ALT: 14 U/L (ref 0–44)
AST: 20 U/L (ref 15–41)
Albumin: 3.7 g/dL (ref 3.5–5.0)
Alkaline Phosphatase: 73 U/L (ref 38–126)
Anion gap: 10 (ref 5–15)
BUN: 25 mg/dL — ABNORMAL HIGH (ref 8–23)
CO2: 26 mmol/L (ref 22–32)
Calcium: 9.8 mg/dL (ref 8.9–10.3)
Chloride: 101 mmol/L (ref 98–111)
Creatinine, Ser: 0.97 mg/dL (ref 0.44–1.00)
GFR, Estimated: >60 mL/min (ref >60)
Glucose, Bld: 103 mg/dL — ABNORMAL HIGH (ref 70–99)
Potassium: 3.9 mmol/L (ref 3.5–5.1)
Sodium: 137 mmol/L (ref 135–145)
Total Bilirubin: 0.3 mg/dL (ref 0.3–1.2)
Total Protein: 7.1 g/dL (ref 6.5–8.1)

## 2021-06-23 LAB — CBC WITH DIFFERENTIAL/PLATELET
Abs Immature Granulocytes: 0.05 10*3/uL (ref 0.00–0.07)
Basophils Absolute: 0.1 10*3/uL (ref 0.0–0.1)
Basophils Relative: 1 %
Eosinophils Absolute: 0.1 10*3/uL (ref 0.0–0.5)
Eosinophils Relative: 1 %
HCT: 41.7 % (ref 36.0–46.0)
Hemoglobin: 14.1 g/dL (ref 12.0–15.0)
Immature Granulocytes: 1 %
Lymphocytes Relative: 16 %
Lymphs Abs: 1.4 10*3/uL (ref 0.7–4.0)
MCH: 29.7 pg (ref 26.0–34.0)
MCHC: 33.8 g/dL (ref 30.0–36.0)
MCV: 87.8 fL (ref 80.0–100.0)
Monocytes Absolute: 0.6 10*3/uL (ref 0.1–1.0)
Monocytes Relative: 7 %
Neutro Abs: 6.4 10*3/uL (ref 1.7–7.7)
Neutrophils Relative %: 74 %
Platelets: 219 10*3/uL (ref 150–400)
RBC: 4.75 MIL/uL (ref 3.87–5.11)
RDW: 12.5 % (ref 11.5–15.5)
WBC: 8.5 10*3/uL (ref 4.0–10.5)
nRBC: 0 % (ref 0.0–0.2)

## 2021-06-23 MED ORDER — SODIUM CHLORIDE 0.9 % IV BOLUS
500.0000 mL | Freq: Once | INTRAVENOUS | Status: AC
  Administered 2021-06-23: 500 mL via INTRAVENOUS

## 2021-06-23 MED ORDER — IOHEXOL 300 MG/ML  SOLN
100.0000 mL | Freq: Once | INTRAMUSCULAR | Status: AC | PRN
  Administered 2021-06-23: 75 mL via INTRAVENOUS

## 2021-06-23 MED ORDER — OXYCODONE-ACETAMINOPHEN 5-325 MG PO TABS
1.0000 | ORAL_TABLET | Freq: Once | ORAL | Status: AC
  Administered 2021-06-23: 1 via ORAL
  Filled 2021-06-23: qty 1

## 2021-06-23 MED ORDER — FENTANYL CITRATE PF 50 MCG/ML IJ SOSY
50.0000 ug | PREFILLED_SYRINGE | Freq: Once | INTRAMUSCULAR | Status: AC
  Administered 2021-06-23: 50 ug via INTRAVENOUS
  Filled 2021-06-23: qty 1

## 2021-06-23 MED ORDER — OXYCODONE HCL 5 MG PO TABS: 5.0000 mg | ORAL_TABLET | ORAL | 0 refills | Status: DC | PRN

## 2021-06-23 NOTE — ED Provider Notes (Signed)
[facility] EMERGENCY DEPARTMENT Provider Note   CSN: [id number] Arrival date & time: 06/23/21  2878     History Chief Complaint  Patient presents with   Jaw Pain    Megan Brewer is a 75 y.o. female.  HPI 75 year old female presents with right jaw pain.  Last week she had multiple mandibular teeth removed.  Starting on 11/4 she developed pain and swelling.  Call the on-call surgeon who started her on amoxicillin and Flagyl.  Swelling seems to be a little better but she is having more pain in her jaw and more pain in her right ear.  She has not had a fever.  Pain is rated as severe.  She been taking ibuprofen and Tylenol with no relief.   Past Medical History:  Diagnosis Date   Anemia    Arachnoiditis    Bipolar disorder (HCC)    C. 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 difficile diarrhea 03/03/2021   Rigors 67/67/2094   Acute metabolic encephalopathy 70/96/2836   SIRS (systemic inflammatory response syndrome) ([place]) 01/14/2021   Elevated troponin 01/14/2021   Hypokalemia 01/14/2021   Headache 01/14/2021   Chest discomfort 03/22/2019   Weight loss 01/27/2019   Acquired hypothyroidism  01/27/2019   Esophagitis 01/27/2019   Intractable migraine without status migrainosus 01/27/2019   Osteoporosis 01/27/2019   Chronic sinusitis 12/28/2018   Food intolerance/GI symptoms 12/28/2018   Acute maxillary sinusitis 12/28/2018   Lower leg edema 12/20/2018   Chronic pain of both shoulders 05/24/2018   Bilateral leg edema 05/24/2018   Chronic pain syndrome 03/31/2018   Left bundle branch block 10/28/2017   Palpitations 10/28/2017   History of gastric bypass 05/21/2017   Polypharmacy 02/11/2017   Senile nuclear sclerosis 11/26/2016   Chronic post-thoracotomy pain 04/09/2014   Chronic kidney disease 04/09/2014   Sleep disturbances 09/20/2012   Restless legs syndrome 07/07/2012   Hypertonicity of bladder 05/23/2012   Essential tremor 04/01/2012   Memory loss 04/01/2012   Incomplete emptying of bladder 01/14/2012   Urge incontinence 10/28/2011   Tension type headache 06/10/2011   Mixed urge and stress incontinence 05/28/2011   Hypogammaglobulinemia ([place]) 06/22/2010   Severe episode of recurrent major depressive disorder, without psychotic features ([place]) 06/22/2010   Hypothyroidism 02/12/2010   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[place]) 01/31/2010   ARACHNOIDITIS 01/31/2010   OBSTRUCTIVE SLEEP APNEA 01/31/2010   Chronic rhinitis 01/31/2010   Arachnoiditis 01/31/2010    Past Surgical History:  Procedure Laterality Date   ABDOMINAL HYSTERECTOMY     CARPAL TUNNEL RELEASE     CATARACT EXTRACTION Bilateral 2019   CHOLECYSTECTOMY  COLONOSCOPY  2015   [facility]   CT LUNG SCREENING  2018   DG  BONE DENSITY ([place] HX)  2018   DIAGNOSTIC MAMMOGRAM  2019   GASTRIC BYPASS     LUNG CANCER SURGERY  02/2010   MULTIPLE TOOTH EXTRACTIONS     PORT-A-CATH REMOVAL Left 02/07/2021   Procedure: REMOVAL PORT-A-CATH;  Surgeon: Kinsinger, Arta Bruce, MD;  Location: [facility];  Service: General;  Laterality: Left;  60 MINUTES ROOM 4   SPINAL CORD STIMULATOR IMPLANT       OB History   No obstetric  history on file.     Family History  Problem Relation Age of Onset   Hypertension Mother    GER disease Mother    Dementia Mother    Osteoporosis Mother    Arthritis Mother    Bipolar disorder Mother    Parkinson's disease Father    Congestive Heart Failure Father    Pneumonia Father    Arthritis Father    Dementia Father    Depression Father    Asthma Sister    Depression Sister    Bipolar disorder Brother    Asthma Daughter    Colon cancer Neg Hx    Esophageal cancer Neg Hx    Stomach cancer Neg Hx    Rectal cancer Neg Hx     Social History   Tobacco Use   Smoking status: Never   Smokeless tobacco: Never  Vaping Use   Vaping Use: Never used  Substance Use Topics   Alcohol use: Not Currently   Drug use: Never    Home Medications Prior to Admission medications   Medication Sig Start Date End Date Taking? Authorizing Provider  oxyCODONE (ROXICODONE) 5 MG immediate release tablet Take 1 tablet (5 mg total) by mouth every 4 (four) hours as needed for severe pain. 06/23/21  Yes Sherwood Gambler, MD  acetaminophen (TYLENOL) 500 MG tablet Take 1-2 tablets (500-1,000 mg total) by mouth every 8 (eight) hours as needed (Max dose 3000 mg in 24 hours). 06/11/20   [name], NP  acetaminophen (TYLENOL) 500 MG tablet Take 500 mg by mouth every 6 (six) hours as needed for moderate pain.    [provider]  azelastine (ASTELIN) 0.1 % nasal spray Place 2 sprays into both nostrils 2 (two) times daily. 03/10/21   Dara Hoyer, FNP  baclofen (LIORESAL) 10 MG tablet Take 10 mg by mouth daily as needed for muscle spasms. 06/03/20   [provider]  Calcium Citrate 200 MG TABS Take 200 mg of elemental calcium by mouth daily. 12/02/16   [provider]  carbidopa-levodopa (SINEMET IR) 25-100 MG tablet Take as needed for severe restless leg syndrome. 03/06/21   Narda Amber K, DO  cholecalciferol (VITAMIN D3) 25 MCG (1000 UNIT) tablet Take 1,000 Units by  mouth daily.    [provider]  COVID-19 mRNA bivalent vaccine, Pfizer, injection Inject into the muscle. 05/21/21   Carlyle Basques, MD  cycloSPORINE (RESTASIS) 0.05 % ophthalmic emulsion Place 1 drop into both eyes 2 (two) times daily.    [provider]  dexlansoprazole (DEXILANT) 60 MG capsule Take 1 capsule (60 mg total) by mouth daily. Crack open the capsule and pour into spoon prior to ingestion. 02/20/21   Esterwood, Amy S, PA-C  Erenumab-aooe (AIMOVIG, 140 MG DOSE,) 70 MG/ML SOAJ Inject 70 mg into the skin every 30 (thirty) days.    [provider]  estradiol (ESTRACE) 0.1 MG/GM vaginal cream  Place 1 Applicatorful vaginally 3 (three) times a week. 01/12/20   [provider]  ferrous sulfate 325 (65 FE) MG tablet Take 650 mg by mouth daily with breakfast.    [provider]  heparin lock flush 100 UNIT/ML SOLN injection Inject 500 Units into the vein every 21 ( twenty-one) days. 03/31/21   [provider]  hydrOXYzine (VISTARIL) 25 MG capsule Take 1 capsule (25 mg total) by mouth daily as needed (panic). 05/28/21   Thayer Headings, PMHNP  ibuprofen (ADVIL) 200 MG tablet Take 200 mg by mouth daily.    [provider]  immune globulin, human, (GAMMAGARD S/D LESS IGA) 10 g injection Inject 2 g/kg into the vein every 21 ( twenty-one) days.    [provider]  influenza vaccine adjuvanted (FLUAD) 0.5 ML injection Inject into the muscle. 05/21/21   Carlyle Basques, MD  lamoTRIgine (LAMICTAL) 200 MG tablet Take 1 tablet (200 mg total) by mouth at bedtime. 05/28/21 08/26/21  Thayer Headings, PMHNP  levothyroxine (SYNTHROID) 75 MCG tablet TAKE ONE (1) TABLET BY MOUTH EACH DAY 30MINUTES BEFORE BREAKFAST ON EMPTY STOMACH 03/12/21   [name], NP  loperamide (IMODIUM A-D) 2 MG tablet Take 2 mg by mouth 4 (four) times daily as needed for diarrhea or loose stools.    [provider]  magnesium oxide (MAG-OX) 400 MG tablet  Take 400 mg by mouth daily.     [provider]  melatonin 5 MG TABS Take 5 mg by mouth.    [provider]  mirtazapine (REMERON) 30 MG tablet Take 1 tablet (30 mg total) by mouth at bedtime. 05/28/21 08/26/21  Thayer Headings, PMHNP  oxybutynin (DITROPAN) 5 MG/5ML syrup Take 2.5 mLs (2.5 mg total) by mouth 2 (two) times daily. 02/25/21   [name], NP  potassium chloride (KLOR-CON) 10 MEQ tablet TAKE ONE (1) TABLET BY MOUTH FOUR (4) TIMES DAILY 05/23/21   [name], NP  rOPINIRole (REQUIP) 1 MG tablet Take 1.5 tablet 2-3 hour prior to bedtime. Patient taking differently: Take 1 mg by mouth at bedtime. 03/06/21   Narda Amber K, DO  saccharomyces boulardii (FLORASTOR) 250 MG capsule Take 1 capsule (250 mg total) by mouth 2 (two) times daily. 01/02/21   [place], Amy E, NP  sodium chloride, PF, 0.9 % injection Inject 5-10 mLs into the vein as needed (Immunoglobulin). 02/12/21   [provider]  sucralfate (CARAFATE) 1 GM/10ML suspension Take 20 mLs (2 g total) by mouth 3 (three) times daily as needed (gerd). 02/14/21   Aline August, MD  tamsulosin (FLOMAX) 0.4 MG CAPS capsule Take 1 capsule (0.4 mg total) by mouth daily. 01/23/21   Shelly Coss, MD  tiZANidine (ZANAFLEX) 4 MG tablet Take 1 tablet (4 mg total) by mouth every 6 (six) hours as needed for muscle spasms. Patient not taking: No sig reported 11/24/20   Drenda Freeze, MD  torsemide (DEMADEX) 20 MG tablet TAKE 1 TABLET BY MOUTH DAILY 01/27/21   [name], NP  triamcinolone cream (KENALOG) 0.1 % Apply 1 application topically 2 (two) times daily as needed (rash).    [provider]  UNABLE TO FIND Take 1 tablet by mouth daily. Med Name: [name] 1 by mouth daily for UTI prevention    [provider]  vitamin B-12 (CYANOCOBALAMIN) 1000 MCG tablet TAKE 1 TABLET BY MOUTH DAILY 02/14/21   [provider]  ZINC OXIDE, TOPICAL, 10 % CREA Apply 1 application topically 2 (two)  times daily as needed (rash). 02/14/21   Aline August, MD    Allergies    Imitrex [sumatriptan], Tetracycline, Corticosteroids, Cefixime, Ciprofloxacin, Doxycycline, Gabapentin, Isometheptene-dichloral-apap, Ketorolac, Prednisone, Promethazine, Stadol [butorphanol], Tape, Erythromycin base, and Propoxyphene  Review of Systems   Review of Systems  Constitutional:  Positive for chills. Negative for fever.  HENT:  Positive for ear pain and facial swelling.   Respiratory:  Negative for shortness of breath.   All other systems reviewed and are negative.  Physical Exam Updated Vital Signs BP 101/73 (BP Location: Right Arm)   Pulse 66   Temp 97.9 F (36.6 C) (Oral)   Resp 16   Ht 5' 2.5" (1.588 m)   Wt 57.6 kg   SpO2 100%   BMI 22.86 kg/m   Physical Exam Vitals and nursing note reviewed.  Constitutional:      Appearance: She is well-developed.  HENT:     Head: Normocephalic and atraumatic.      Right Ear: Tympanic membrane and external ear normal.     Left Ear: Tympanic membrane and external ear normal.     Nose: Nose normal.     Mouth/Throat:      Comments: No swelling to floor of mouth Eyes:     General:        Right eye: No discharge.        Left eye: No discharge.  Cardiovascular:     Rate and Rhythm: Normal rate and regular rhythm.     Heart sounds: Normal heart sounds.  Pulmonary:     Effort: Pulmonary effort is normal.     Breath sounds: Normal breath sounds.  Abdominal:     Palpations: Abdomen is soft.     Tenderness: There is no abdominal tenderness.  Musculoskeletal:     Cervical back: Neck supple. No rigidity.  Skin:    General: Skin is warm and dry.  Neurological:     Mental Status: She is alert.  Psychiatric:        Mood and Affect: Mood is not anxious.    ED Results / Procedures / Treatments   Labs (all labs ordered are listed, but only abnormal results are displayed) Labs Reviewed  COMPREHENSIVE METABOLIC PANEL - Abnormal; Notable for the  following components:      Result Value   Glucose, Bld 103 (*)    BUN 25 (*)    All other components within normal limits  CBC WITH DIFFERENTIAL/PLATELET    EKG None  Radiology CT Soft Tissue Neck W Contrast  Result Date: 06/23/2021 CLINICAL DATA:  Neck abscess, deep tissue. Four teeth old from the right mandible 6 days ago with subsequent swelling. EXAM: CT NECK WITH CONTRAST TECHNIQUE: Multidetector CT imaging of the neck was performed using the standard protocol following the bolus administration of intravenous contrast. CONTRAST:  75mL OMNIPAQUE IOHEXOL 300 MG/ML  SOLN COMPARISON:  None. FINDINGS: Pharynx and larynx: No evidence of mucosal or submucosal lesion of the pharynx or larynx. The patient does have mild gingival swelling in the region of the right mandible. No evidence of a regional drainable soft tissue fluid collection. Salivary glands: Parotid and submandibular glands are normal. Thyroid: Normal for age. Lymph nodes: No enlarged or low-density nodes on either side of the neck. Vascular: Minimal atherosclerotic change at the carotid bifurcations. No stenosis. Limited intracranial: Normal Visualized orbits: Not included. Mastoids and visualized paranasal sinuses: Previous functional endoscopic sinus surgery. Chronic mucoperiosteal thickening of the maxillary sinuses. Chronic inflammatory change of the left  division of the sphenoid sinus. Skeleton: Ordinary cervical spondylosis. The patient has had recent dental extraction of 3 teeth in the right mandibular region in 1 tooth in the left mandibular region. No evidence of retained fragments. Upper chest: Lung apices are clear. Other: Negative IMPRESSION: Status post recent extraction of 3 right mandibular teeth and 1 left mandibular tooth. Mild gingival swelling on the right without evidence of a drainable fluid collection. No evidence of deep space inflammatory disease. Electronically Signed   By: Nelson Chimes M.D.   On: 06/23/2021 12:35     Procedures Procedures   Medications Ordered in ED Medications  sodium chloride 0.9 % bolus 500 mL (0 mLs Intravenous Stopped 06/23/21 1251)  fentaNYL (SUBLIMAZE) injection 50 mcg (50 mcg Intravenous Given by Other 06/23/21 1119)  iohexol (OMNIPAQUE) 300 MG/ML solution 100 mL (75 mLs Intravenous Contrast Given 06/23/21 1203)  oxyCODONE-acetaminophen (PERCOCET/ROXICET) 5-325 MG per tablet 1 tablet (1 tablet Oral Given 06/23/21 1309)    ED Course  I have reviewed the triage vital signs and the nursing notes.  Pertinent labs & imaging results that were available during my care of the patient were reviewed by me and considered in my medical decision making (see chart for details).    MDM Rules/Calculators/A&P                           This seems mostly like a pain control issue.  There is no obvious abscess on CT or exam.  At this point, I will give her some short course of pain control and have her follow-up with her surgeon tomorrow as scheduled.  I do not think she needs to emergently change antibiotics as the seem like the swelling is actually improved and there are no systemic symptoms and normal vital/normal blood work for her. Final Clinical Impression(s) / ED Diagnoses Final diagnoses:  Post-op pain    Rx / DC Orders ED Discharge Orders          Ordered    oxyCODONE (ROXICODONE) 5 MG immediate release tablet  Every 4 hours PRN        06/23/21 1301             Sherwood Gambler, MD 06/23/21 1435

## 2021-06-23 NOTE — ED Notes (Signed)
Port accessed by Angelina Pih RN

## 2021-06-23 NOTE — ED Triage Notes (Signed)
4 teeth pulled on Tuesday. Given amoxcillin and flagyl on Friday by oral surgeon after she noticed significant swelling. Now having pain in jaw and right ear. Sent here by [facility].

## 2021-06-23 NOTE — Discharge Instructions (Addendum)
Follow up with your oral surgeon as scheduled. If you develop fever, new or worsening pain or swelling, or trouble breathing/swallowing, or any other new/concerning symptoms then return to the ER for evaluation

## 2021-06-26 ENCOUNTER — Ambulatory Visit (INDEPENDENT_AMBULATORY_CARE_PROVIDER_SITE_OTHER): Payer: Medicare PPO

## 2021-06-26 ENCOUNTER — Ambulatory Visit: Payer: Medicare PPO | Admitting: Internal Medicine

## 2021-06-26 ENCOUNTER — Encounter: Payer: Self-pay | Admitting: Internal Medicine

## 2021-06-26 ENCOUNTER — Other Ambulatory Visit: Payer: Self-pay

## 2021-06-26 DIAGNOSIS — R0609 Other forms of dyspnea: Secondary | ICD-10-CM

## 2021-06-26 DIAGNOSIS — R058 Other specified cough: Secondary | ICD-10-CM

## 2021-06-26 DIAGNOSIS — R059 Cough, unspecified: Secondary | ICD-10-CM | POA: Diagnosis not present

## 2021-06-26 DIAGNOSIS — R0602 Shortness of breath: Secondary | ICD-10-CM | POA: Diagnosis not present

## 2021-06-26 LAB — CBC WITH DIFFERENTIAL/PLATELET
Basophils Absolute: 0.1 10*3/uL (ref 0.0–0.1)
Basophils Relative: 1.5 % (ref 0.0–3.0)
Eosinophils Absolute: 0.2 10*3/uL (ref 0.0–0.7)
Eosinophils Relative: 2.6 % (ref 0.0–5.0)
HCT: 42 % (ref 36.0–46.0)
Hemoglobin: 14 g/dL (ref 12.0–15.0)
Lymphocytes Relative: 22.1 % (ref 12.0–46.0)
Lymphs Abs: 1.7 10*3/uL (ref 0.7–4.0)
MCHC: 33.3 g/dL (ref 30.0–36.0)
MCV: 88.7 fl (ref 78.0–100.0)
Monocytes Absolute: 0.6 10*3/uL (ref 0.1–1.0)
Monocytes Relative: 7.3 % (ref 3.0–12.0)
Neutro Abs: 5.1 10*3/uL (ref 1.4–7.7)
Neutrophils Relative %: 66.5 % (ref 43.0–77.0)
Platelets: 245 10*3/uL (ref 150.0–400.0)
RBC: 4.74 Mil/uL (ref 3.87–5.11)
RDW: 13.2 % (ref 11.5–15.5)
WBC: 7.6 10*3/uL (ref 4.0–10.5)

## 2021-06-26 LAB — BRAIN NATRIURETIC PEPTIDE: Pro B Natriuretic peptide (BNP): 70 pg/mL (ref 0.0–100.0)

## 2021-06-26 LAB — SEDIMENTATION RATE: Sed Rate: 5 mm/hr (ref 0–30)

## 2021-06-26 MED ORDER — FAMOTIDINE 20 MG PO TABS: ORAL_TABLET | ORAL | 11 refills | Status: DC

## 2021-06-26 NOTE — Assessment & Plan Note (Addendum)
Onset around 2018 p moved to [place] from [place] -  Variable stridor/ hoarseness on exam 06/26/2021  - Allergy profile 06/26/2021 >  Eos 0.2 /  IgE < 2    - Referred to Dr Carol Ada [facility]  06/26/2021 >>>   Upper airway cough syndrome (previously labeled PNDS),  is so named because it's frequently impossible to sort out how much is  CR/sinusitis with freq throat clearing (which can be related to primary GERD)   vs  causing  secondary (" extra esophageal")  GERD from wide swings in gastric pressure that occur with throat clearing, often  promoting self use of mint and menthol lozenges that reduce the lower esophageal sphincter tone and exacerbate the problem further in a cyclical fashion.   These are the same pts (now being labeled as having "irritable larynx syndrome" by some cough centers) who not infrequently have a history of having failed to tolerate ace inhibitors,  dry powder inhalers or biphosphonates or report having atypical/extraesophageal reflux symptoms that don't respond to standard doses of PPI  and are easily confused as having aecopd or asthma flares by even experienced allergists/ pulmonologists (myself included).   The variable hoarseness and stridor appreciated on exam today are c/w irritable larynx/vcd over other forms of UACS so rec max rx for gerd  (add pepcid 20 mg q pm since the worst symptoms are in am on rising)  and refer to Dr Carol Ada at [facility] with pfts to be done in the interim but I don't think there is a pulmonary source for the cough.

## 2021-06-26 NOTE — Progress Notes (Signed)
Megan Brewer, female    DOB: [birth year], 75 y.o.   MRN: [id number]   Brief patient profile:  58 yowf never smoker sickly as child c sinus and ear infections dx as immune deficiency at [place] at age 35 and then developed lung Ca  around 2010 R upper lobe removed at [facility] sp chemo no RT with chronic R rib pain neuralgia pattern R C 6-8 area since  then worse sob x 2018 with days/weeks/ months of moving into relatively new construction in [place] and then recurrent uti rx macrodantin and other abx > C diff / June 2022. And since then waxing and waning hoarseness and sob at rest may correlate with hoarseness and reproducible doe x room to room referred to pulmonary clinic 06/26/2021 by Dr  Dr Percival Spanish   Admit date: 02/04/2021 Discharge date: 02/14/2021  Home Health: No Equipment/Devices: None   Discharge Condition: Stable CODE STATUS: Full Diet recommendation: Heart healthy   Brief/Interim Summary: 75 year old female with bipolar disorder, lung cancer, C. difficile colitis, hypogammaglobulinemia, hypothyroidism, gastric bypass, recent hospitalization for gram-negative bacteremia and C. difficile infection treated with oral vancomycin and 2 weeks of IV cefepime which was completed on 01/29/2021 presented as a direct admission from the ID clinic with rigors, malaise.  She was admitted for persistent gram-negative bacteremia and started on IV meropenem and oral vancomycin was continued for C. difficile.  ID was consulted.   Discharge Diagnoses:    Concern for persistent gram-negative bacteremia -Patient completed 2 weeks of IV cefepime on 01/29/2021 for Cram negative/Gram variable bacteremia: Sensitivities and identification of organism on 01/14/2021 is still pending from [place] -Blood cultures from 02/04/2021 growing gram-negative rods: ID and sensitivities pending from [facility].  Status post port removal on 02/07/2021 -Repeat blood cultures from 02/04/2021: No growth so far.  TTE negative for  vegetations -ID recommended to continue meropenem till 02/14/2021 which will complete 7 days post removal of port -Currently afebrile and hemodynamically stable. -She will be discharged home today.  Outpatient follow-up with ID.   C. difficile colitis, second episode -Diarrhea improving.  Outpatient follow-up with ID.  Continue prolonged oral vancomycin taper as per ID as below: 125 mg orally 4 times daily for 14 days, then 125 mg orally 2 times daily for 7 days, then 125 mg orally once daily for 7 days, then 125 mg orally every 2 days for 2 weeks   B12 deficiency -Received IM B12 on 02/08/2021.  Will need oral supplementation on discharge   History of hypogammaglobinemia -receives Gammagard every 3 weeks, last dose 6/17   Migraine headaches -Gets Aimovig every monthly -Headache improving.  Outpatient follow-up.   Hypothyroidism -Continue Synthroid  Overactive bladder -Continue oxybutynin  Urinary retention -continue Flomax  History of gastric bypass  Essential hypertension -Continue home regimen.   History of Present Illness  06/26/2021  Pulmonary/ 1st office eval/ [facility]on Folta / [place] [facility]  Chief Complaint  Patient presents with   Pulmonary Consult    Referred by Dr Minus Breeding.  Pt c/o SOB for at least the past 4 months. She states she can get SOB with or without exertion. Some days she is out of breath just walking room to room. She states that she occ has diff with swallowing and she has some hoarseness and voice fatigue.   Dyspnea: short of breath at rest ? Correlates but not always c doe with activity started 4 days of dischange from above admit after doing well for 4days after d/c "walking faster than  my therapist"   Cough: throat itches/ sense of globus/mostly dry  Sleep: coughs wakes her up a cough sev times a week, sleeps on sofa edge of arm slt elevated feels the worst first thing in am in terms of cough and sob  SABA use: none  Has h/o lung ca s/p lobectomy  on R with neuralgia pattern pain ever since   No obvious patterns in day to day or daytime variability or assoc excess/ purulent sputum or mucus plugs or hemoptysis,  chest tightness, subjective wheeze or overt sinus or hb symptoms.      Also denies any obvious fluctuation of symptoms with weather or environmental changes or other aggravating or alleviating factors except as outlined above   No unusual exposure hx or h/o childhood pna/ asthma or knowledge of premature birth.  Current Allergies, Complete Past Medical History, Past Surgical History, Family History, and Social History were reviewed in [employer] record.  ROS  The following are not active complaints unless bolded Hoarseness, sore throat/globus, dysphagia, dental problems, itching, sneezing,  nasal congestion or discharge of excess mucus or purulent secretions, ear ache,   fever, chills, sweats, unintended wt loss or wt gain, classically   exertional cp,  orthopnea pnd or arm/hand swelling  or leg swelling, presyncope, palpitations, abdominal pain, anorexia, nausea, vomiting, diarrhea  or change in bowel habits or change in bladder habits, change in stools or change in urine, dysuria, hematuria,  rash, arthralgias, visual complaints, headache, numbness, weakness or ataxia or problems with walking or coordination,  change in mood or  memory.           Past Medical History:  Diagnosis Date   Anemia    Arachnoiditis    Bipolar disorder (HCC)    C. 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     acetaminophen (TYLENOL) 500 MG tablet Take 500 mg by mouth every 6 (six) hours as needed for moderate pain.     azelastine (ASTELIN) 0.1 % nasal spray Place 2 sprays into both nostrils 2 (two) times daily. 30 mL 5   baclofen (LIORESAL) 10 MG tablet Take 10 mg by mouth daily as needed for muscle spasms.     Calcium Citrate 200 MG TABS Take 200 mg of elemental calcium by mouth daily.     carbidopa-levodopa (SINEMET IR) 25-100 MG tablet Take as needed for severe restless leg syndrome. 30 tablet 2   cholecalciferol (VITAMIN D3) 25 MCG (1000 UNIT) tablet Take 1,000 Units by mouth daily.     COVID-19 mRNA bivalent vaccine, Pfizer, injection Inject into the muscle. 0.3 mL 0   cycloSPORINE (RESTASIS) 0.05 % ophthalmic emulsion Place  1 drop into both eyes 2 (two) times daily.     dexlansoprazole (DEXILANT) 60 MG capsule Take 1 capsule (60 mg total) by mouth daily. Crack open the capsule and pour into spoon prior to ingestion. 30 capsule 11   Erenumab-aooe (AIMOVIG, 140 MG DOSE,) 70 MG/ML SOAJ Inject 70 mg into the skin every 30 (thirty) days.     estradiol (ESTRACE) 0.1 MG/GM vaginal cream Place 1 Applicatorful vaginally 3 (three) times a week.     ferrous sulfate 325 (65 FE) MG tablet Take 650 mg by mouth daily with breakfast.     heparin lock flush 100 UNIT/ML SOLN injection Inject 500 Units into the vein every 21 ( twenty-one) days.     hydrOXYzine (VISTARIL) 25 MG capsule Take 1 capsule (25 mg total) by mouth daily as needed (panic). 30 capsule 5   ibuprofen (ADVIL) 200 MG tablet Take 200 mg by mouth daily.     immune globulin, human, (GAMMAGARD S/D LESS IGA) 10 g injection Inject 2 g/kg into the vein every 21 (  twenty-one) days.     influenza vaccine adjuvanted (FLUAD) 0.5 ML injection Inject into the muscle. 0.5 mL 0   lamoTRIgine (LAMICTAL) 200 MG tablet Take 1 tablet (200 mg total) by mouth at bedtime. 90 tablet 1   levothyroxine (SYNTHROID) 75 MCG tablet TAKE ONE (1) TABLET BY MOUTH EACH DAY 30MINUTES BEFORE BREAKFAST ON EMPTY STOMACH 90 tablet 1   loperamide (IMODIUM A-D) 2 MG tablet Take 2 mg by mouth 4 (four) times daily as needed for diarrhea or loose stools.     magnesium oxide (MAG-OX) 400 MG tablet Take 400 mg by mouth daily.      melatonin 5 MG TABS Take 5 mg by mouth.     mirtazapine (REMERON) 30 MG tablet Take 1 tablet (30 mg total) by mouth at bedtime. 90 tablet 1   oxybutynin (DITROPAN) 5 MG/5ML syrup Take 2.5 mLs (2.5 mg total) by mouth 2 (two) times daily. 473 mL 1   oxyCODONE (ROXICODONE) 5 MG immediate release tablet Take 1 tablet (5 mg total) by mouth every 4 (four) hours as needed for severe pain. 10 tablet 0   potassium chloride (KLOR-CON) 10 MEQ tablet TAKE ONE (1) TABLET BY MOUTH FOUR (4) TIMES DAILY 120 tablet 1   rOPINIRole (REQUIP) 1 MG tablet Take 1.5 tablet 2-3 hour prior to bedtime. (Patient taking differently: Take 1 mg by mouth at bedtime.) 135 tablet 3   saccharomyces boulardii (FLORASTOR) 250 MG capsule Take 1 capsule (250 mg total) by mouth 2 (two) times daily. 30 capsule 3   sodium chloride, PF, 0.9 % injection Inject 5-10 mLs into the vein as needed (Immunoglobulin).     sucralfate (CARAFATE) 1 GM/10ML suspension Take 20 mLs (2 g total) by mouth 3 (three) times daily as needed (gerd).     tamsulosin (FLOMAX) 0.4 MG CAPS capsule Take 1 capsule (0.4 mg total) by mouth daily. 30 capsule 1   tiZANidine (ZANAFLEX) 4 MG tablet Take 1 tablet (4 mg total) by mouth every 6 (six) hours as needed for muscle spasms. 10 tablet 0   torsemide (DEMADEX) 20 MG tablet TAKE 1 TABLET BY MOUTH DAILY 90 tablet 1   triamcinolone cream (KENALOG) 0.1 % Apply 1 application topically 2 (two)  times daily as needed (rash).     UNABLE TO FIND Take 1 tablet by mouth daily. Med Name: [name] 1 by mouth daily for UTI prevention     vitamin B-12 (CYANOCOBALAMIN)  1000 MCG tablet TAKE 1 TABLET BY MOUTH DAILY     ZINC OXIDE, TOPICAL, 10 % CREA Apply 1 application topically 2 (two) times daily as needed (rash).     No facility-administered medications prior to visit.     Objective:     BP (!) 102/58 (BP Location: Left Arm, Cuff Size: Normal)   Pulse 69   Ht 5\' 2"  (1.575 m)   Wt 132 lb 6.4 oz (60.1 kg)   SpO2 100% Comment: on RA  BMI 24.22 kg/m   SpO2: 100 % (on RA)  Hoarse amb wf nad/ variable stridor / very somber and soft spoken    HEENT : pt wearing mask not removed for exam due to covid -19 concerns.    NECK :  without JVD/Nodes/TM/ nl carotid upstrokes bilaterally   LUNGS: no acc muscle use,  Nl contour chest which is clear to A and P bilaterally without cough on insp or exp maneuvers   CV:  RRR  no s3 or murmur or increase in P2, and elastic hose, no palp edema   ABD:  soft and nontender with nl inspiratory excursion in the supine position. No bruits or organomegaly appreciated, bowel sounds nl  MS:  Nl gait/ ext warm without deformities, calf tenderness, cyanosis or clubbing No obvious joint restrictions   SKIN: warm and dry without lesions    NEURO:  alert, approp, nl sensorium with  no motor or cerebellar deficits apparent.    CXR PA and Lateral:   06/26/2021 :    I personally reviewed images and agree with radiology impression as follows:    No active cardiopulmonary disease.  Labs ordered/ reviewed:      Chemistry      Component Value Date/Time   NA 139 06/26/2021 1607   K 4.6 06/26/2021 1607   CL 104 06/26/2021 1607   CO2 23 06/26/2021 1607   BUN 28 (H) 06/26/2021 1607   CREATININE 1.12 06/26/2021 1607   CREATININE 1.03 (H) 04/03/2021 1207      Component Value Date/Time   CALCIUM 9.1 06/26/2021 1607   ALKPHOS 73 06/23/2021 1056   AST 20  06/23/2021 1056   ALT 14 06/23/2021 1056   BILITOT 0.3 06/23/2021 1056        Lab Results  Component Value Date   WBC 7.6 06/26/2021   HGB 14.0 06/26/2021   HCT 42.0 06/26/2021   MCV 88.7 06/26/2021   PLT 245.0 06/26/2021     Lab Results  Component Value Date   DDIMER 0.56 (H) 06/26/2021      Lab Results  Component Value Date   TSH 4.42 06/26/2021     Lab Results  Component Value Date   PROBNP 70.0 06/26/2021       Lab Results  Component Value Date   ESRSEDRATE 5 06/26/2021            Assessment   No problem-specific Assessment & Plan notes found for this encounter.     Christinia Gully, MD 06/26/2021

## 2021-06-26 NOTE — Patient Instructions (Signed)
Continue dexilant 60 mg Take 30-60 min before first meal of the day   Add pepcid 20 mg one daily after supper   GERD (REFLUX)  is an extremely common cause of respiratory symptoms just like yours , many times with no obvious heartburn at all.    It can be treated with medication, but also with lifestyle changes including elevation of the head of your bed (ideally with 6 -8inch blocks under the headboard of your bed),  Smoking cessation, avoidance of late meals, excessive alcohol, and avoid fatty foods, chocolate, peppermint, colas, red wine, and acidic juices such as orange juice.  NO MINT OR MENTHOL PRODUCTS SO NO COUGH DROPS  USE SUGARLESS CANDY INSTEAD (Jolley ranchers or Stover's or Life Savers) or even ice chips will also do - the key is to swallow to prevent all throat clearing. NO OIL BASED VITAMINS - use powdered substitutes.  Avoid fish oil when coughing.   Please remember to go to the lab and x-ray department  for your tests - we will call you with the results when they are available.       We will be referring you to voice center at [facility]/ Dr Carol Ada and scheduling PFTs in meantime and call you with the results  Follow up here will be as needed as I don't believe this is a lung problem.

## 2021-06-27 ENCOUNTER — Other Ambulatory Visit: Payer: Self-pay | Admitting: Family

## 2021-06-27 LAB — D-DIMER, QUANTITATIVE: D-Dimer, Quant: 0.56 mcg/mL FEU — ABNORMAL HIGH (ref <0.50)

## 2021-06-27 LAB — BASIC METABOLIC PANEL
BUN: 28 mg/dL — ABNORMAL HIGH (ref 6–23)
CO2: 23 mEq/L (ref 19–32)
Calcium: 9.1 mg/dL (ref 8.4–10.5)
Chloride: 104 mEq/L (ref 96–112)
Creatinine, Ser: 1.12 mg/dL (ref 0.40–1.20)
GFR: 48.97 mL/min — ABNORMAL LOW (ref >60.00)
Glucose, Bld: 100 mg/dL — ABNORMAL HIGH (ref 70–99)
Potassium: 4.6 mEq/L (ref 3.5–5.1)
Sodium: 139 mEq/L (ref 135–145)

## 2021-06-27 LAB — TSH: TSH: 4.42 u[IU]/mL (ref 0.35–5.50)

## 2021-06-27 LAB — IGE: IgE (Immunoglobulin E), Serum: <2 kU/L (ref <=114)

## 2021-06-29 ENCOUNTER — Encounter: Payer: Self-pay | Admitting: Internal Medicine

## 2021-06-29 NOTE — Assessment & Plan Note (Signed)
Doe much worse since d/c July 2022 - 06/26/2021   Walked on RA x     approx 200 @ slow, unsteady  pace, stopped due to sob and dizzy  with lowest 02 sats 96%   Symptoms are markedly disproportionate to objective findings and not clear to what extent this is actually a pulmonary  problem but pt does appear to have difficult to sort out respiratory symptoms of unknown origin for which  DDX  = almost all start with A and  include Adherence, Ace Inhibitors, Acid Reflux, Active Sinus Disease, Alpha 1 Antitripsin deficiency, Anxiety masquerading as Airways dz,  ABPA,  Allergy(esp in young), Aspiration (esp in elderly), Adverse effects of meds,  Active smoking or Vaping, A bunch of PE's/clot burden (a few small clots can't cause this syndrome unless there is already severe underlying pulm or vascular dz with poor reserve),  Anemia or thyroid disorder, plus two Bs  = Bronchiectasis and Beta blocker use..and one C= CHF     Adherence is always the initial "prime suspect" and is a multilayered concern that requires a "trust but verify" approach in every patient - starting with knowing how to use medications, especially inhalers, correctly, keeping up with refills and understanding the fundamental difference between maintenance and prns vs those medications only taken for a very short course and then stopped and not refilled.   ? Acid (or non-acid) GERD > always difficult to exclude as up to 75% of pts in some series report no assoc GI/ Heartburn symptoms> rec max (24h)  acid suppression and diet restrictions/ reviewed and instructions given in writing.   ? Allergy /asthma > nothing to suggest, strongly suspect pseudoasthma/ vcd by hx and exam  ? Anxiety/depression> usually at the bottom of this list of usual suspects but note already on psychotropics and may interfere with adherence and also interpretation of response or lack thereof to symptom management which can be quite subjective.  ? Anemia/thyroid dz >  ruled out today   ? Adverse drug effects > was on macrodantin but none lately, no ILD on cxr or desats walking   ? A bunch of PE's >  D dimer nl - while a normal  or high normal value (seen commonly in the elderly or chronically ill)  may miss small peripheral pe, the clot burden with sob is moderately high and the d dimer  has a very high neg pred value if used in this setting.    ? Bronchiectasis > at risk based on immunodeficiency bu clinically and radiographically this dx doesn't fit   ? CHF > cards w/u neg/ bnp quite low excludes    >>>  With no evidence of cardiac or pulmonary explanation for her problem best bet is this is vcd >  Check pfts and referred to DR Joya Gaskins at [facility]/ voice center.   Each maintenance medication was reviewed in detail including emphasizing most importantly the difference between maintenance and prns and under what circumstances the prns are to be triggered using an action plan format where appropriate.  Total time for H and P, chart review, counseling,  directly observing portions of ambulatory 02 saturation study/ and generating customized AVS unique to this new pt office visit / same day charting  > 60 min

## 2021-06-30 ENCOUNTER — Encounter: Payer: Self-pay | Admitting: Nurse Practitioner

## 2021-06-30 ENCOUNTER — Other Ambulatory Visit: Payer: Self-pay

## 2021-06-30 ENCOUNTER — Ambulatory Visit: Payer: Medicare PPO | Admitting: Nurse Practitioner

## 2021-06-30 VITALS — BP 110/66 | HR 66 | Temp 97.9°F | Ht 63.0 in | Wt 130.6 lb

## 2021-06-30 DIAGNOSIS — R058 Other specified cough: Secondary | ICD-10-CM

## 2021-06-30 DIAGNOSIS — F3162 Bipolar disorder, current episode mixed, moderate: Secondary | ICD-10-CM

## 2021-06-30 DIAGNOSIS — N390 Urinary tract infection, site not specified: Secondary | ICD-10-CM

## 2021-06-30 DIAGNOSIS — N3281 Overactive bladder: Secondary | ICD-10-CM

## 2021-06-30 DIAGNOSIS — Z1231 Encounter for screening mammogram for malignant neoplasm of breast: Secondary | ICD-10-CM

## 2021-06-30 DIAGNOSIS — G2581 Restless legs syndrome: Secondary | ICD-10-CM

## 2021-06-30 DIAGNOSIS — E039 Hypothyroidism, unspecified: Secondary | ICD-10-CM | POA: Diagnosis not present

## 2021-06-30 DIAGNOSIS — D801 Nonfamilial hypogammaglobulinemia: Secondary | ICD-10-CM

## 2021-06-30 NOTE — Patient Instructions (Signed)
To get shingles and prevnar 15 at your local pharmacy  Make sure you are getting enough protein in your diet.

## 2021-06-30 NOTE — Progress Notes (Signed)
Careteam: Patient Care Team: [name], NP as PCP - General (Geriatric Medicine) Alda Berthold, DO as Consulting Physician (Neurology)  PLACE OF SERVICE:  [place] Directive information Does Patient Have a Medical Advance Directive?: Yes, Type of Advance Directive: [place], Does patient want to make changes to medical advance directive?: No - Patient declined  Allergies  Allergen Reactions   Imitrex [Sumatriptan] Nausea And Vomiting    Ringing in the ears, migraines are worse   Tetracycline Nausea Only    Causes ringing in ears, dizziness, migraine, nausea   Corticosteroids Other (See Comments)    12/02/2016 interview by Melissa Montane: Oral dosing causes migraines/nausea/tinnitus. Prev tolerated IV and intranasal admin w/o difficulty.   Cefixime Other (See Comments)    Headache    Ciprofloxacin Other (See Comments)    Headache, dizziness, ringing in the ears   Doxycycline Other (See Comments)    unknown   Gabapentin     Throat swells/closes   Isometheptene-Dichloral-Apap Other (See Comments)    unknown   Ketorolac     12/02/2016 interview by GUR: Oral dosing prednisone/corticosteroids causes migraines/nausea/tinnitus. Prev tolerated IV and intranasal admin w/o difficulty.   Prednisone Other (See Comments)    Migraine, dizzy, ringing in ears-can take it IV 12/02/2016 interview by [name]: Oral prednisone dosing causes migraines/nausea/tinnitus. Prev tolerated IV and intranasal admin w/o difficulty.   Promethazine Other (See Comments)    causes severe abdominal pain when taken IV; however she can take PO or IM   Stadol [Butorphanol]     Cerebral pain   Tape Other (See Comments)    Adhesive tapes-patient denies   Erythromycin Base Diarrhea and Itching    Severe diarrhea   Propoxyphene Nausea Only    Chief Complaint  Patient presents with   Medical Management of Chronic Issues    1 year follow-up. Discuss need for mammogram, shingrix and  pneumonia vaccine or postpone/exclude if patient refuses.      HPI: Patient is a 75 y.o. female for routine follow up.   Pt with a complicated medical history. She has been seen by several specialist over the last few months.   She had Cdiff over the summer and was hospitalized twice with this. She followed up with GI during this time.   She also had elevated wbc and ID was consulted, her port-a-cath was removed due to being the source but has since been replaced due to getting IGG infusions   Has been followed with psych and reports her mood is not great.  Having a more difficult time sleeping. Reports she has felt so bad for 15 years and can no get back.  Meeting with therapist.    Feeling very weak. She has put on 15 lbs since she left the hospital. Trying to make sure she gets protein and fiber.   She reports she is not having chest pain but more of a pressure that causes her to not get her breath. Has followed with cardiology and pulmonary in trying to get answers.  Recently followed up with pulmonary. plans to follow up and have PFTs  Review of Systems:  Review of Systems  Constitutional:  Negative for chills, fever and weight loss.  HENT:  Negative for tinnitus.   Respiratory:  Negative for cough, sputum production and shortness of breath.   Cardiovascular:  Negative for chest pain, palpitations and leg swelling.       Chest pressure- ongoing follow up with cardiology  and pulmonary   Gastrointestinal:  Negative for abdominal pain, constipation, diarrhea and heartburn.  Genitourinary:  Negative for dysuria, frequency and urgency.  Musculoskeletal:  Negative for back pain, falls, joint pain and myalgias.  Skin: Negative.   Neurological:  Negative for dizziness and headaches.  Psychiatric/Behavioral:  Negative for depression and memory loss. The patient is nervous/anxious and has insomnia.    Past Medical History:  Diagnosis Date   Anemia    Arachnoiditis    Bipolar  disorder (HCC)    C. 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[place])    Lung cancer ([place]) 2011, 2014   Memory loss    Migraine    Opioid abuse ([place])    Osteoporosis    Presence of neurostimulator    Restless legs    SIRS (systemic inflammatory response syndrome) ([place])    Sleep apnea    Past Surgical History:  Procedure Laterality Date   ABDOMINAL HYSTERECTOMY     CARPAL TUNNEL RELEASE     CATARACT EXTRACTION Bilateral 2019   CHOLECYSTECTOMY     COLONOSCOPY  2015   [facility]   CT LUNG SCREENING  2018   DENTAL SURGERY  06/17/2021   4 teeth rmoved   DG  BONE DENSITY ([place] [facility])  2018   DIAGNOSTIC MAMMOGRAM  2019   GASTRIC BYPASS     LUNG CANCER SURGERY  02/2010   MULTIPLE TOOTH EXTRACTIONS     PORT A CATH REVISION Right 04/2021   PORT-A-CATH REMOVAL Left 02/07/2021   Procedure: REMOVAL PORT-A-CATH;  Surgeon: Kieth Brightly Arta Bruce, MD;  Location: [facility];  Service: General;  Laterality: Left;  60 MINUTES ROOM 4   SPINAL CORD STIMULATOR IMPLANT     Social History:   reports that she has never smoked. She has never used smokeless tobacco. She reports that she does not currently use alcohol. She reports that she does not use drugs.  Family History  Problem Relation Age of Onset   Hypertension Mother    GER disease Mother    Dementia Mother    Osteoporosis Mother    Arthritis Mother    Bipolar disorder Mother    Parkinson's disease Father    Congestive Heart Failure Father    Pneumonia Father    Arthritis Father    Dementia Father    Depression Father    Asthma Sister    Depression Sister    Bipolar disorder Brother    Asthma Daughter    Colon cancer Neg Hx    Esophageal cancer Neg Hx    Stomach cancer Neg Hx    Rectal cancer Neg  Hx     Medications: Patient's Medications  New Prescriptions   No medications on file  Previous Medications   ACETAMINOPHEN (TYLENOL) 500 MG TABLET    Take 1-2 tablets (500-1,000 mg total) by mouth every 8 (eight) hours as needed (Max dose 3000 mg in 24 hours).   ACETAMINOPHEN (TYLENOL) 500 MG TABLET    Take 500 mg by mouth every 6 (six) hours as needed for moderate pain.   AZELASTINE (ASTELIN) 0.1 % NASAL SPRAY    Place 2 sprays into both nostrils 2 (two) times daily.   BACLOFEN (LIORESAL) 10 MG  TABLET    Take 10 mg by mouth daily as needed for muscle spasms.   CALCIUM CITRATE 200 MG TABS    Take 200 mg of elemental calcium by mouth daily.   CARBIDOPA-LEVODOPA (SINEMET IR) 25-100 MG TABLET    Take as needed for severe restless leg syndrome.   CHOLECALCIFEROL (VITAMIN D3) 25 MCG (1000 UNIT) TABLET    Take 1,000 Units by mouth daily.   COVID-19 MRNA BIVALENT VACCINE, PFIZER, INJECTION    Inject into the muscle.   CYCLOSPORINE (RESTASIS) 0.05 % OPHTHALMIC EMULSION    Place 1 drop into both eyes 2 (two) times daily.   DEXLANSOPRAZOLE (DEXILANT) 60 MG CAPSULE    Take 1 capsule (60 mg total) by mouth daily. Crack open the capsule and pour into spoon prior to ingestion.   ERENUMAB-AOOE (AIMOVIG, 140 MG DOSE,) 70 MG/ML SOAJ    Inject 70 mg into the skin every 30 (thirty) days.   ESTRADIOL (ESTRACE) 0.1 MG/GM VAGINAL CREAM    Place 1 Applicatorful vaginally 3 (three) times a week.   FAMOTIDINE (PEPCID) 20 MG TABLET    One after supper   FERROUS SULFATE 325 (65 FE) MG TABLET    Take 650 mg by mouth daily with breakfast.   HEPARIN LOCK FLUSH 100 UNIT/ML SOLN INJECTION    Inject 500 Units into the vein every 21 ( twenty-one) days.   HYDROXYZINE (VISTARIL) 25 MG CAPSULE    Take 1 capsule (25 mg total) by mouth daily as needed (panic).   IBUPROFEN (ADVIL) 200 MG TABLET    Take 200 mg by mouth daily.   IMMUNE GLOBULIN, HUMAN, (GAMMAGARD S/D LESS IGA) 10 G INJECTION    Inject 2 g/kg into the vein every 21  ( twenty-one) days.   INFLUENZA VACCINE ADJUVANTED (FLUAD) 0.5 ML INJECTION    Inject into the muscle.   LAMOTRIGINE (LAMICTAL) 200 MG TABLET    Take 1 tablet (200 mg total) by mouth at bedtime.   LEVOTHYROXINE (SYNTHROID) 75 MCG TABLET    TAKE ONE (1) TABLET BY MOUTH EACH DAY 30MINUTES BEFORE BREAKFAST ON EMPTY STOMACH   LOPERAMIDE (IMODIUM A-D) 2 MG TABLET    Take 2 mg by mouth 4 (four) times daily as needed for diarrhea or loose stools.   MAGNESIUM OXIDE (MAG-OX) 400 MG TABLET    Take 400 mg by mouth daily.    MELATONIN 5 MG TABS    Take 5 mg by mouth.   MIRTAZAPINE (REMERON) 30 MG TABLET    Take 1 tablet (30 mg total) by mouth at bedtime.   OXYBUTYNIN (DITROPAN) 5 MG/5ML SYRUP    Take 2.5 mLs (2.5 mg total) by mouth 2 (two) times daily.   POTASSIUM CHLORIDE (KLOR-CON) 10 MEQ TABLET    TAKE ONE (1) TABLET BY MOUTH FOUR (4) TIMES DAILY   ROPINIROLE (REQUIP) 1 MG TABLET    Take 1.5 tablet 2-3 hour prior to bedtime.   SACCHAROMYCES BOULARDII (FLORASTOR) 250 MG CAPSULE    Take 1 capsule (250 mg total) by mouth 2 (two) times daily.   SODIUM CHLORIDE, PF, 0.9 % INJECTION    Inject 5-10 mLs into the vein as needed (Immunoglobulin).   SUCRALFATE (CARAFATE) 1 GM/10ML SUSPENSION    TAKE 2 TEASPOONSFUL BY MOUTH THREE TIME DAILY AS NEEDED   TAMSULOSIN (FLOMAX) 0.4 MG CAPS CAPSULE    Take 1 capsule (0.4 mg total) by mouth daily.   TORSEMIDE (DEMADEX) 20 MG TABLET    TAKE 1 TABLET BY MOUTH  DAILY   TRIAMCINOLONE CREAM (KENALOG) 0.1 %    Apply 1 application topically 2 (two) times daily as needed (rash).   UNABLE TO FIND    Take 1 tablet by mouth daily. Med Name: [name] 1 by mouth daily for UTI prevention   VITAMIN B-12 (CYANOCOBALAMIN) 1000 MCG TABLET    TAKE 1 TABLET BY MOUTH DAILY   ZINC OXIDE, TOPICAL, 10 % CREA    Apply 1 application topically 2 (two) times daily as needed (rash).  Modified Medications   No medications on file  Discontinued Medications   OXYCODONE (ROXICODONE) 5 MG IMMEDIATE RELEASE  TABLET    Take 1 tablet (5 mg total) by mouth every 4 (four) hours as needed for severe pain.   TIZANIDINE (ZANAFLEX) 4 MG TABLET    Take 1 tablet (4 mg total) by mouth every 6 (six) hours as needed for muscle spasms.    Physical Exam:  Vitals:   06/30/21 1607  BP: 110/66  Pulse: 66  Temp: 97.9 F (36.6 C)  TempSrc: Temporal  SpO2: 99%  Weight: 130 lb 9.6 oz (59.2 kg)  Height: 5\' 3"  (1.6 m)   Body mass index is 23.13 kg/m. Wt Readings from Last 3 Encounters:  06/30/21 130 lb 9.6 oz (59.2 kg)  06/26/21 132 lb 6.4 oz (60.1 kg)  06/23/21 127 lb (57.6 kg)    Physical Exam Constitutional:      General: She is not in acute distress.    Appearance: She is well-developed. She is not diaphoretic.  HENT:     Head: Normocephalic and atraumatic.     Mouth/Throat:     Pharynx: No oropharyngeal exudate.  Eyes:     Conjunctiva/sclera: Conjunctivae normal.     Pupils: Pupils are equal, round, and reactive to light.  Cardiovascular:     Rate and Rhythm: Normal rate and regular rhythm.     Heart sounds: Normal heart sounds.  Pulmonary:     Effort: Pulmonary effort is normal.     Breath sounds: Normal breath sounds.  Abdominal:     General: Bowel sounds are normal.     Palpations: Abdomen is soft.  Musculoskeletal:     Cervical back: Normal range of motion and neck supple.     Right lower leg: No edema.     Left lower leg: No edema.  Skin:    General: Skin is warm and dry.  Neurological:     Mental Status: She is alert. Mental status is at baseline.  Psychiatric:     Comments: Flat affect    Labs reviewed: Basic Metabolic Panel: Recent Labs    01/14/21 1547 01/14/21 2158 01/15/21 0030 01/15/21 0500 01/16/21 0538 01/21/21 0339 01/22/21 0342 04/03/21 1332 06/23/21 1056 06/26/21 1607  NA 135   < >  --  135   < > 138   < > 140 137 139  K 2.8*   < >  --  4.0   < > 3.0*   < > 5.1 3.9 4.6  CL 101   < >  --  106   < > 109   < > 104 101 104  CO2 23   < >  --  25   < > 22    < > 26 26 23   GLUCOSE 136*   < >  --  121*   < > 129*   < > 80 103* 100*  BUN 23   < >  --  15   < >  14   < > 40* 25* 28*  CREATININE 0.81   < >  --  0.65   < > 0.80   < > 1.07* 0.97 1.12  CALCIUM 8.9   < >  --  8.1*   < > 7.9*   < > 10.4* 9.8 9.1  MG 1.4*  --   --  3.0*  --  1.9  --   --   --   --   PHOS  --   --  3.2 2.8  --   --   --   --   --   --   TSH  --   --   --  2.990  --   --   --   --   --  4.42   < > = values in this interval not displayed.   Liver Function Tests: Recent Labs    02/05/21 0310 04/03/21 1207 04/03/21 1321 06/23/21 1056  AST 36 46* 62* 20  ALT 31 30* 30 14  ALKPHOS 66  --  93 73  BILITOT 0.4 0.2 0.2* 0.3  PROT 6.5 7.2 8.5* 7.1  ALBUMIN 2.9*  --  4.0 3.7   Recent Labs    08/28/20 1426 12/09/20 1138  LIPASE 46 32   No results for input(s): AMMONIA in the last 8760 hours. CBC: Recent Labs    04/03/21 1207 04/03/21 1332 06/23/21 1056 06/26/21 1607  WBC 17.5* 11.2* 8.5 7.6  NEUTROABS 9,328*  --  6.4 5.1  HGB 14.2 14.8 14.1 14.0  HCT 42.3 45.6 41.7 42.0  MCV 89.4 91.4 87.8 88.7  PLT 331 291 219 245.0   Lipid Panel: No results for input(s): CHOL, HDL, LDLCALC, TRIG, CHOLHDL, LDLDIRECT in the last 8760 hours. TSH: Recent Labs    01/15/21 0500 06/26/21 1607  TSH 2.990 4.42   A1C: No results found for: HGBA1C   Assessment/Plan 1. Restless legs syndrome (RLS) Stable continues on requip and   2. OAB (overactive bladder) Stable, Continues on oxybutynin at this time.   3. Recurrent UTI Followed by urology, no recent infection and doing well on flomax   4. Bipolar disorder, current episode mixed, moderate ([place]) -ongoing, working with psych at this time. Continues with routine visit and counseling. So SI or Hi at this time.   5. Acquired hypothyroidism TSH at goal on synthroid 75 mcg  6. Breast cancer screening by mammogram - [facility] DIGITAL SCREENING BILATERAL; Future  7. Upper airway cough syndrome with ? VCD  Plans to have PFTs  through pulmonary.   8. Hypogammaglobulinemia ([place]) -continues with infusions, has had port replaced and doing well.    Next appt: 6 months. Sooner if needed  [employer]. [place], [facility] [phone]

## 2021-07-03 DIAGNOSIS — H9201 Otalgia, right ear: Secondary | ICD-10-CM | POA: Diagnosis not present

## 2021-07-14 ENCOUNTER — Encounter: Payer: Self-pay | Admitting: Neurology

## 2021-07-14 ENCOUNTER — Other Ambulatory Visit: Payer: Self-pay

## 2021-07-14 ENCOUNTER — Ambulatory Visit: Payer: Medicare PPO | Admitting: Neurology

## 2021-07-14 VITALS — BP 88/59 | HR 74 | Ht 63.0 in | Wt 133.0 lb

## 2021-07-14 DIAGNOSIS — G039 Meningitis, unspecified: Secondary | ICD-10-CM

## 2021-07-14 DIAGNOSIS — G250 Essential tremor: Secondary | ICD-10-CM

## 2021-07-14 DIAGNOSIS — G2581 Restless legs syndrome: Secondary | ICD-10-CM

## 2021-07-14 DIAGNOSIS — R2681 Unsteadiness on feet: Secondary | ICD-10-CM | POA: Diagnosis not present

## 2021-07-14 NOTE — Patient Instructions (Addendum)
We will send a referral for home physical therapy  Return to clinic in 4 months

## 2021-07-14 NOTE — Progress Notes (Signed)
Follow-up Visit   Date: 07/14/21   Megan Brewer MRN: [id number] DOB: Dec 21, 1945   Interim History: Megan Brewer is a 75 y.o. right handed female with lung cancer, chronic back pain s/p spinal cord stimulator (nonfunctioning), depression, GERD, CKD, immunoglobulin deficiency, and hypothyroidism  returning to the clinic for follow-up of restless leg syndrome and new complaints of tremors.  The patient was accompanied to the clinic by self.  History of present illness: She reports having restless leg syndrome for about 20 years and has been on ropinirole 1mg  at bedtime which controls her symptoms, however, when she transferred care locally, she was instructed to come off her ropinirole and reduced it to 0.5mg  at bedtime.  Her RLS particularly became worse in February. Because this dose did not control her discomfort, she self-titrated ropinirole to 1mg  at bedtime with tablets that she had from prior refill.  When she takes ropinirole 1mg  at bedtime, she sleeps well and RLS does not bother her.  Of note, she was found to have severe iron deficiency anemia (ferritin 4.9) and is on iron supplements.   UPDATE 03/06/2021:  She is here for follow-up, last seen in March 2021 at which time she was continued on ropinirole 1mg  at bedtime and ferritin was found to be very low.  She has been on iron and her last ferritin is up to 42.  She has several hospitalizations this year, most recently for c. difficile and sepsis.  She tells me that her RLS is well-controlled on ropinirole 1mg  at bedtime about 3-4 nights of the week, but the remaining night, she may wake up within an hour having to take another 1mg , and rarely will take up to 3mg .    She also complains of generalized tremors of the arms which has been long standing.  Her father had Parksinon's and she is concerned about this.  Her tremors are worse with action, such as when doing tasks.  She denies resting tremor. She denies stiffness.  She  has some imbalance, but walks unassisted and has not suffered any falls.   UPDATE 07/14/2021: Starting in August, she has noticed that RLS can start earlier in the day around 4p.  She has tried sinemet 4-5 times which has helped. Most of the time ropinirole 1.5mg  controls her RLS. Her tremors are mild unchanged.  Her balance and gait has been more unsteady. She has history of arachnoiditis and low back pain.  She was seeing pain management and has spinal cord stimulator which is nonfunctional now.  Medications:  Current Outpatient Medications on File Prior to Visit  Medication Sig Dispense Refill   acetaminophen (TYLENOL) 500 MG tablet Take 1-2 tablets (500-1,000 mg total) by mouth every 8 (eight) hours as needed (Max dose 3000 mg in 24 hours).     acetaminophen (TYLENOL) 500 MG tablet Take 500 mg by mouth every 6 (six) hours as needed for moderate pain.     azelastine (ASTELIN) 0.1 % nasal spray Place 2 sprays into both nostrils 2 (two) times daily. 30 mL 5   baclofen (LIORESAL) 10 MG tablet Take 10 mg by mouth daily as needed for muscle spasms.     Calcium Citrate 200 MG TABS Take 200 mg of elemental calcium by mouth daily.     carbidopa-levodopa (SINEMET IR) 25-100 MG tablet Take as needed for severe restless leg syndrome. 30 tablet 2   cholecalciferol (VITAMIN D3) 25 MCG (1000 UNIT) tablet Take 1,000 Units by mouth daily.  COVID-19 mRNA bivalent vaccine, Pfizer, injection Inject into the muscle. 0.3 mL 0   cycloSPORINE (RESTASIS) 0.05 % ophthalmic emulsion Place 1 drop into both eyes 2 (two) times daily.     dexlansoprazole (DEXILANT) 60 MG capsule Take 1 capsule (60 mg total) by mouth daily. Crack open the capsule and pour into spoon prior to ingestion. 30 capsule 11   Erenumab-aooe (AIMOVIG, 140 MG DOSE,) 70 MG/ML SOAJ Inject 70 mg into the skin every 30 (thirty) days.     estradiol (ESTRACE) 0.1 MG/GM vaginal cream Place 1 Applicatorful vaginally 3 (three) times a week.     famotidine  (PEPCID) 20 MG tablet One after supper 30 tablet 11   ferrous sulfate 325 (65 FE) MG tablet Take 650 mg by mouth daily with breakfast.     heparin lock flush 100 UNIT/ML SOLN injection Inject 500 Units into the vein every 21 ( twenty-one) days.     hydrOXYzine (VISTARIL) 25 MG capsule Take 1 capsule (25 mg total) by mouth daily as needed (panic). 30 capsule 5   ibuprofen (ADVIL) 200 MG tablet Take 200 mg by mouth daily.     immune globulin, human, (GAMMAGARD S/D LESS IGA) 10 g injection Inject 2 g/kg into the vein every 21 ( twenty-one) days.     influenza vaccine adjuvanted (FLUAD) 0.5 ML injection Inject into the muscle. 0.5 mL 0   lamoTRIgine (LAMICTAL) 200 MG tablet Take 1 tablet (200 mg total) by mouth at bedtime. 90 tablet 1   levothyroxine (SYNTHROID) 75 MCG tablet TAKE ONE (1) TABLET BY MOUTH EACH DAY 30MINUTES BEFORE BREAKFAST ON EMPTY STOMACH 90 tablet 1   loperamide (IMODIUM A-D) 2 MG tablet Take 2 mg by mouth 4 (four) times daily as needed for diarrhea or loose stools.     magnesium oxide (MAG-OX) 400 MG tablet Take 400 mg by mouth daily.      melatonin 5 MG TABS Take 5 mg by mouth.     mirtazapine (REMERON) 30 MG tablet Take 1 tablet (30 mg total) by mouth at bedtime. 90 tablet 1   oxybutynin (DITROPAN) 5 MG/5ML syrup Take 2.5 mLs (2.5 mg total) by mouth 2 (two) times daily. 473 mL 1   potassium chloride (KLOR-CON) 10 MEQ tablet TAKE ONE (1) TABLET BY MOUTH FOUR (4) TIMES DAILY 120 tablet 1   rOPINIRole (REQUIP) 1 MG tablet Take 1.5 tablet 2-3 hour prior to bedtime. 135 tablet 3   saccharomyces boulardii (FLORASTOR) 250 MG capsule Take 1 capsule (250 mg total) by mouth 2 (two) times daily. 30 capsule 3   sodium chloride, PF, 0.9 % injection Inject 5-10 mLs into the vein as needed (Immunoglobulin).     sucralfate (CARAFATE) 1 GM/10ML suspension TAKE 2 TEASPOONSFUL BY MOUTH THREE TIME DAILY AS NEEDED 420 mL 1   tamsulosin (FLOMAX) 0.4 MG CAPS capsule Take 1 capsule (0.4 mg total) by  mouth daily. 30 capsule 1   torsemide (DEMADEX) 20 MG tablet TAKE 1 TABLET BY MOUTH DAILY 90 tablet 1   triamcinolone cream (KENALOG) 0.1 % Apply 1 application topically 2 (two) times daily as needed (rash).     UNABLE TO FIND Take 1 tablet by mouth daily. Med Name: [name] 1 by mouth daily for UTI prevention     vitamin B-12 (CYANOCOBALAMIN) 1000 MCG tablet TAKE 1 TABLET BY MOUTH DAILY     ZINC OXIDE, TOPICAL, 10 % CREA Apply 1 application topically 2 (two) times daily as needed (rash).     No  current facility-administered medications on file prior to visit.    Allergies:  Allergies  Allergen Reactions   Imitrex [Sumatriptan] Nausea And Vomiting    Ringing in the ears, migraines are worse   Tetracycline Nausea Only    Causes ringing in ears, dizziness, migraine, nausea   Corticosteroids Other (See Comments)    12/02/2016 interview by HQR: Oral dosing causes migraines/nausea/tinnitus. Prev tolerated IV and intranasal admin w/o difficulty.   Cefixime Other (See Comments)    Headache    Ciprofloxacin Other (See Comments)    Headache, dizziness, ringing in the ears   Doxycycline Other (See Comments)    unknown   Gabapentin     Throat swells/closes   Isometheptene-Dichloral-Apap Other (See Comments)    unknown   Ketorolac     12/02/2016 interview by FXJ: Oral dosing prednisone/corticosteroids causes migraines/nausea/tinnitus. Prev tolerated IV and intranasal admin w/o difficulty.   Prednisone Other (See Comments)    Migraine, dizzy, ringing in ears-can take it IV 12/02/2016 interview by [name]: Oral prednisone dosing causes migraines/nausea/tinnitus. Prev tolerated IV and intranasal admin w/o difficulty.   Promethazine Other (See Comments)    causes severe abdominal pain when taken IV; however she can take PO or IM   Stadol [Butorphanol]     Cerebral pain   Tape Other (See Comments)    Adhesive tapes-patient denies   Erythromycin Base Diarrhea and Itching    Severe diarrhea    Propoxyphene Nausea Only    Vital Signs:  BP (!) 88/59   Pulse 74   Ht 5\' 3"  (1.6 m)   Wt 133 lb (60.3 kg)   SpO2 99%   BMI 23.56 kg/m   Neurological Exam: MENTAL STATUS including orientation to time, place, person, recent and remote memory, attention span and concentration, language, and fund of knowledge is normal.  Speech is not dysarthric.  CRANIAL NERVES:  No visual field defects.  Pupils equal round and reactive to light.  Normal conjugate, extra-ocular eye movements in all directions of gaze.  No ptosis.    MOTOR:  Motor strength is 5/5 in all extremities.  No atrophy, fasciculations or abnormal movements.  No pronator drift.  Tone is normal.    MSRs:  Reflexes are 2+/4 throughout.  SENSORY:  Intact to vibration throughout.  COORDINATION/GAIT:  Normal finger-to- nose-finger.  Intact rapid alternating movements bilaterally.  Gait narrow based and stable.   Data: Lab Results  Component Value Date   FERRITIN 42 02/06/2021     IMPRESSION/PLAN: Restless leg syndrome, stable    - Continue ropinirole 1.5mg  take 2-3 hours before bedtime - continue sinemet 25/100 prn, especially during the days when her symptoms start sooner in the day  - Continue iron supplement 2.  Unsteady gait in the setting of chroniclow back pain due to arachnoiditis  - Refer for home PT for gait training  3.  Benign tremors. No clear signs of parkinson's disease  - If this progresses or her exam changes, DAT scan can be ordered  Return to clinic in 4 months  Thank you for allowing me to participate in patient's care.  If I can answer any additional questions, I would be pleased to do so.    Sincerely,    Valeria Krisko K. Posey Pronto, DO

## 2021-07-16 DIAGNOSIS — G2581 Restless legs syndrome: Secondary | ICD-10-CM | POA: Diagnosis not present

## 2021-07-16 DIAGNOSIS — G250 Essential tremor: Secondary | ICD-10-CM | POA: Diagnosis not present

## 2021-07-16 DIAGNOSIS — D509 Iron deficiency anemia, unspecified: Secondary | ICD-10-CM | POA: Diagnosis not present

## 2021-07-16 DIAGNOSIS — G8929 Other chronic pain: Secondary | ICD-10-CM | POA: Diagnosis not present

## 2021-07-16 DIAGNOSIS — M25552 Pain in left hip: Secondary | ICD-10-CM | POA: Diagnosis not present

## 2021-07-16 DIAGNOSIS — M545 Low back pain, unspecified: Secondary | ICD-10-CM | POA: Diagnosis not present

## 2021-07-16 DIAGNOSIS — C3490 Malignant neoplasm of unspecified part of unspecified bronchus or lung: Secondary | ICD-10-CM | POA: Diagnosis not present

## 2021-07-16 DIAGNOSIS — G039 Meningitis, unspecified: Secondary | ICD-10-CM | POA: Diagnosis not present

## 2021-07-25 ENCOUNTER — Other Ambulatory Visit (HOSPITAL_BASED_OUTPATIENT_CLINIC_OR_DEPARTMENT_OTHER): Payer: Self-pay

## 2021-07-25 MED ORDER — PREVNAR 20 0.5 ML IM SUSY
PREFILLED_SYRINGE | INTRAMUSCULAR | 0 refills | Status: DC
  Filled 2021-07-25 (×2): qty 0.5, 1d supply, fill #0

## 2021-07-25 MED ORDER — SHINGRIX 50 MCG/0.5ML IM SUSR
INTRAMUSCULAR | 1 refills | Status: DC
  Filled 2021-07-25: qty 1, 1d supply, fill #0

## 2021-07-29 ENCOUNTER — Ambulatory Visit (INDEPENDENT_AMBULATORY_CARE_PROVIDER_SITE_OTHER): Payer: Medicare PPO | Admitting: Internal Medicine

## 2021-07-29 ENCOUNTER — Other Ambulatory Visit: Payer: Self-pay

## 2021-07-29 DIAGNOSIS — R0609 Other forms of dyspnea: Secondary | ICD-10-CM

## 2021-07-29 DIAGNOSIS — R058 Other specified cough: Secondary | ICD-10-CM | POA: Diagnosis not present

## 2021-07-29 LAB — PULMONARY FUNCTION TEST
DL/VA % pred: 95 %
DL/VA: 3.92 ml/min/mmHg/L
DLCO cor % pred: 74 %
DLCO cor: 14.38 ml/min/mmHg
DLCO unc % pred: 76 %
DLCO unc: 14.64 ml/min/mmHg
FEF 25-75 Post: 2.32 L/sec
FEF 25-75 Pre: 2.15 L/sec
FEF2575-%Change-Post: 7 %
FEF2575-%Pred-Post: 132 %
FEF2575-%Pred-Pre: 122 %
FEV1-%Change-Post: -1 %
FEV1-%Pred-Post: 91 %
FEV1-%Pred-Pre: 92 %
FEV1-Post: 1.99 L
FEV1-Pre: 2.02 L
FEV1FVC-%Change-Post: 0 %
FEV1FVC-%Pred-Pre: 113 %
FEV6-%Change-Post: 0 %
FEV6-%Pred-Post: 84 %
FEV6-%Pred-Pre: 84 %
FEV6-Post: 2.33 L
FEV6-Pre: 2.34 L
FEV6FVC-%Pred-Post: 104 %
FEV6FVC-%Pred-Pre: 104 %
FVC-%Change-Post: -1 %
FVC-%Pred-Post: 80 %
FVC-%Pred-Pre: 81 %
FVC-Post: 2.33 L
FVC-Pre: 2.36 L
Post FEV1/FVC ratio: 85 %
Post FEV6/FVC ratio: 100 %
Pre FEV1/FVC ratio: 85 %
Pre FEV6/FVC Ratio: 100 %
RV % pred: 85 %
RV: 1.92 L
TLC % pred: 88 %
TLC: 4.47 L

## 2021-07-29 NOTE — Progress Notes (Signed)
PFT done today. 

## 2021-07-31 ENCOUNTER — Other Ambulatory Visit: Payer: Self-pay | Admitting: Nurse Practitioner

## 2021-07-31 DIAGNOSIS — M79605 Pain in left leg: Secondary | ICD-10-CM

## 2021-08-06 ENCOUNTER — Other Ambulatory Visit: Payer: Self-pay

## 2021-08-06 ENCOUNTER — Ambulatory Visit (INDEPENDENT_AMBULATORY_CARE_PROVIDER_SITE_OTHER): Payer: Medicare PPO | Admitting: Family Medicine

## 2021-08-06 ENCOUNTER — Encounter: Payer: Self-pay | Admitting: Family Medicine

## 2021-08-06 VITALS — BP 110/60 | HR 86 | Temp 96.9°F | Ht 64.0 in | Wt 136.4 lb

## 2021-08-06 DIAGNOSIS — R609 Edema, unspecified: Secondary | ICD-10-CM

## 2021-08-06 DIAGNOSIS — R600 Localized edema: Secondary | ICD-10-CM | POA: Diagnosis not present

## 2021-08-06 DIAGNOSIS — R0789 Other chest pain: Secondary | ICD-10-CM

## 2021-08-06 DIAGNOSIS — N1831 Chronic kidney disease, stage 3a: Secondary | ICD-10-CM

## 2021-08-06 NOTE — Patient Instructions (Signed)
Double your Torsemide for 1 week and re-check lab next week

## 2021-08-06 NOTE — Progress Notes (Signed)
Provider:  Alain Honey, MD  Careteam: Patient Care Team: Megan Chandler, NP as PCP - General (Geriatric Medicine) Alda Berthold, DO as Consulting Physician (Neurology)  PLACE OF SERVICE:  [place]  Advanced Directive information    Allergies  Allergen Reactions   Imitrex [Sumatriptan] Nausea And Vomiting    Ringing in the ears, migraines are worse   Tetracycline Nausea Only    Causes ringing in ears, dizziness, migraine, nausea   Corticosteroids Other (See Comments)    12/02/2016 interview by Melissa Montane: Oral dosing causes migraines/nausea/tinnitus. Prev tolerated IV and intranasal admin w/o difficulty.   Cefixime Other (See Comments)    Headache    Ciprofloxacin Other (See Comments)    Headache, dizziness, ringing in the ears   Doxycycline Other (See Comments)    unknown   Gabapentin     Throat swells/closes   Isometheptene-Dichloral-Apap Other (See Comments)    unknown   Ketorolac     12/02/2016 interview by [facility]: Oral dosing prednisone/corticosteroids causes migraines/nausea/tinnitus. Prev tolerated IV and intranasal admin w/o difficulty.   Prednisone Other (See Comments)    Migraine, dizzy, ringing in ears-can take it IV 12/02/2016 interview by [name]: Oral prednisone dosing causes migraines/nausea/tinnitus. Prev tolerated IV and intranasal admin w/o difficulty.   Promethazine Other (See Comments)    causes severe abdominal pain when taken IV; however she can take PO or IM   Stadol [Butorphanol]     Cerebral pain   Tape Other (See Comments)    Adhesive tapes-patient denies   Erythromycin Base Diarrhea and Itching    Severe diarrhea   Propoxyphene Nausea Only    Chief Complaint  Patient presents with   Acute Visit    Patient presents today for bilateral leg swelling for a month.     HPI: Patient is a 75 y.o. female  Patient is here with increased edema.  She has had dependent edema since she was a teenager per her history.  She has been on torsemide  chronically and wears support stockings and elevates her legs while sitting.  She denies any increased breathing difficulties.  Review of her recent labs shows no cardiomegaly on chest x-ray renal function which is stable as are electrolytes, and normal pulmonary function studies Review of Systems:  Review of Systems  Cardiovascular:  Positive for leg swelling.  All other systems reviewed and are negative.  Past Medical History:  Diagnosis Date   Anemia    Arachnoiditis    Bipolar disorder (HCC)    C. 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) 2011, 2014   Memory loss    Migraine    Opioid abuse ([place])    Osteoporosis    Presence of neurostimulator    Restless legs    SIRS (systemic inflammatory response syndrome) ([place])    Sleep apnea    Past Surgical History:  Procedure Laterality Date   ABDOMINAL HYSTERECTOMY     CARPAL TUNNEL RELEASE     CATARACT EXTRACTION Bilateral 2019   CHOLECYSTECTOMY     COLONOSCOPY  2015   [facility]   CT LUNG SCREENING  2018   DENTAL SURGERY  06/17/2021   4 teeth  rmoved   DG  BONE DENSITY ([facility])  2018   DIAGNOSTIC MAMMOGRAM  2019   GASTRIC BYPASS     LUNG CANCER SURGERY  02/2010   MULTIPLE TOOTH EXTRACTIONS     PORT A CATH REVISION Right 04/2021   PORT-A-CATH REMOVAL Left 02/07/2021   Procedure: REMOVAL PORT-A-CATH;  Surgeon: Kinsinger, Arta Bruce, MD;  Location: [facility];  Service: General;  Laterality: Left;  60 MINUTES ROOM 4   SPINAL CORD STIMULATOR IMPLANT     Social History:   reports that she has never smoked. She has never used smokeless tobacco. She reports that she does not currently use alcohol. She reports that she does not use drugs.  Family History  Problem  Relation Age of Onset   Hypertension Mother    GER disease Mother    Dementia Mother    Osteoporosis Mother    Arthritis Mother    Bipolar disorder Mother    Parkinson's disease Father    Congestive Heart Failure Father    Pneumonia Father    Arthritis Father    Dementia Father    Depression Father    Asthma Sister    Depression Sister    Bipolar disorder Brother    Asthma Daughter    Colon cancer Neg Hx    Esophageal cancer Neg Hx    Stomach cancer Neg Hx    Rectal cancer Neg Hx     Medications: Patient's Medications  New Prescriptions   No medications on file  Previous Medications   ACETAMINOPHEN (TYLENOL) 500 MG TABLET    Take 1-2 tablets (500-1,000 mg total) by mouth every 8 (eight) hours as needed (Max dose 3000 mg in 24 hours).   ACETAMINOPHEN (TYLENOL) 500 MG TABLET    Take 500 mg by mouth every 6 (six) hours as needed for moderate pain.   AZELASTINE (ASTELIN) 0.1 % NASAL SPRAY    Place 2 sprays into both nostrils 2 (two) times daily.   BACLOFEN (LIORESAL) 10 MG TABLET    Take 10 mg by mouth daily as needed for muscle spasms.   CALCIUM CITRATE 200 MG TABS    Take 200 mg of elemental calcium by mouth daily.   CARBIDOPA-LEVODOPA (SINEMET IR) 25-100 MG TABLET    Take as needed for severe restless leg syndrome.   CHOLECALCIFEROL (VITAMIN D3) 25 MCG (1000 UNIT) TABLET    Take 1,000 Units by mouth daily.   COVID-19 MRNA BIVALENT VACCINE, PFIZER, INJECTION    Inject into the muscle.   CYCLOSPORINE (RESTASIS) 0.05 % OPHTHALMIC EMULSION    Place 1 drop into both eyes 2 (two) times daily.   DEXLANSOPRAZOLE (DEXILANT) 60 MG CAPSULE    Take 1 capsule (60 mg total) by mouth daily. Crack open the capsule and pour into spoon prior to ingestion.   ERENUMAB-AOOE (AIMOVIG, 140 MG DOSE,) 70 MG/ML SOAJ    Inject 70 mg into the skin every 30 (thirty) days.   ESTRADIOL (ESTRACE) 0.1 MG/GM VAGINAL CREAM    Place 1 Applicatorful vaginally 3 (three) times a week.   FAMOTIDINE (PEPCID) 20 MG  TABLET    One after supper   FERROUS SULFATE 325 (65 FE) MG TABLET    Take 650 mg by mouth daily with breakfast.   HEPARIN LOCK FLUSH 100 UNIT/ML SOLN INJECTION    Inject 500 Units into the vein every 21 ( twenty-one) days.   HYDROXYZINE (VISTARIL) 25 MG CAPSULE    Take 1 capsule (25 mg total) by mouth  daily as needed (panic).   IBUPROFEN (ADVIL) 200 MG TABLET    Take 200 mg by mouth daily.   IMMUNE GLOBULIN, HUMAN, (GAMMAGARD S/D LESS IGA) 10 G INJECTION    Inject 2 g/kg into the vein every 21 ( twenty-one) days.   INFLUENZA VACCINE ADJUVANTED (FLUAD) 0.5 ML INJECTION    Inject into the muscle.   LAMOTRIGINE (LAMICTAL) 200 MG TABLET    Take 1 tablet (200 mg total) by mouth at bedtime.   LEVOTHYROXINE (SYNTHROID) 75 MCG TABLET    TAKE ONE (1) TABLET BY MOUTH EACH DAY 30MINUTES BEFORE BREAKFAST ON EMPTY STOMACH   LOPERAMIDE (IMODIUM A-D) 2 MG TABLET    Take 2 mg by mouth 4 (four) times daily as needed for diarrhea or loose stools.   MAGNESIUM OXIDE (MAG-OX) 400 MG TABLET    Take 400 mg by mouth daily.    MELATONIN 5 MG TABS    Take 5 mg by mouth.   MIRTAZAPINE (REMERON) 30 MG TABLET    Take 1 tablet (30 mg total) by mouth at bedtime.   OXYBUTYNIN (DITROPAN) 5 MG/5ML SYRUP    Take 2.5 mLs (2.5 mg total) by mouth 2 (two) times daily.   POTASSIUM CHLORIDE (KLOR-CON) 10 MEQ TABLET    TAKE ONE (1) TABLET BY MOUTH FOUR (4) TIMES DAILY   ROPINIROLE (REQUIP) 1 MG TABLET    Take 1.5 tablet 2-3 hour prior to bedtime.   SACCHAROMYCES BOULARDII (FLORASTOR) 250 MG CAPSULE    Take 1 capsule (250 mg total) by mouth 2 (two) times daily.   SODIUM CHLORIDE, PF, 0.9 % INJECTION    Inject 5-10 mLs into the vein as needed (Immunoglobulin).   SUCRALFATE (CARAFATE) 1 GM/10ML SUSPENSION    TAKE 2 TEASPOONSFUL BY MOUTH THREE TIME DAILY AS NEEDED   TAMSULOSIN (FLOMAX) 0.4 MG CAPS CAPSULE    Take 1 capsule (0.4 mg total) by mouth daily.   TORSEMIDE (DEMADEX) 20 MG TABLET    TAKE 1 TABLET BY MOUTH DAILY   TRIAMCINOLONE  CREAM (KENALOG) 0.1 %    Apply 1 application topically 2 (two) times daily as needed (rash).   UNABLE TO FIND    Take 1 tablet by mouth daily. Med Name: Megan Brewer 1 by mouth daily for UTI prevention   VITAMIN B-12 (CYANOCOBALAMIN) 1000 MCG TABLET    TAKE 1 TABLET BY MOUTH DAILY   ZINC OXIDE, TOPICAL, 10 % CREA    Apply 1 application topically 2 (two) times daily as needed (rash).   ZOSTER VACCINE ADJUVANTED [facility]) INJECTION    Inject into the muscle.  Modified Medications   No medications on file  Discontinued Medications   No medications on file    Physical Exam:  Vitals:   08/06/21 0822  Weight: 136 lb 6.4 oz (61.9 kg)  Height: 5\' 4"  (1.626 m)   Body mass index is 23.41 kg/m. Wt Readings from Last 3 Encounters:  08/06/21 136 lb 6.4 oz (61.9 kg)  07/14/21 133 lb (60.3 kg)  06/30/21 130 lb 9.6 oz (59.2 kg)    Physical Exam Vitals and nursing note reviewed.  Constitutional:      Appearance: Normal appearance.  Cardiovascular:     Rate and Rhythm: Normal rate and regular rhythm.  Pulmonary:     Effort: Pulmonary effort is normal.     Breath sounds: Normal breath sounds.  Musculoskeletal:     Right lower leg: Edema present.     Left lower leg: Edema present.  Comments: Legs are swollen but are soft and not red or warm to touch  Neurological:     Mental Status: She is alert.    Labs reviewed: Basic Metabolic Panel: Recent Labs    01/14/21 1547 01/14/21 2158 01/15/21 0030 01/15/21 0500 01/16/21 0538 01/21/21 0339 01/22/21 0342 04/03/21 1332 06/23/21 1056 06/26/21 1607  NA 135   < >  --  135   < > 138   < > 140 137 139  K 2.8*   < >  --  4.0   < > 3.0*   < > 5.1 3.9 4.6  CL 101   < >  --  106   < > 109   < > 104 101 104  CO2 23   < >  --  25   < > 22   < > 26 26 23   GLUCOSE 136*   < >  --  121*   < > 129*   < > 80 103* 100*  BUN 23   < >  --  15   < > 14   < > 40* 25* 28*  CREATININE 0.81   < >  --  0.65   < > 0.80   < > 1.07* 0.97 1.12  CALCIUM 8.9   < >   --  8.1*   < > 7.9*   < > 10.4* 9.8 9.1  MG 1.4*  --   --  3.0*  --  1.9  --   --   --   --   PHOS  --   --  3.2 2.8  --   --   --   --   --   --   TSH  --   --   --  2.990  --   --   --   --   --  4.42   < > = values in this interval not displayed.   Liver Function Tests: Recent Labs    02/05/21 0310 04/03/21 1207 04/03/21 1321 06/23/21 1056  AST 36 46* 62* 20  ALT 31 30* 30 14  ALKPHOS 66  --  93 73  BILITOT 0.4 0.2 0.2* 0.3  PROT 6.5 7.2 8.5* 7.1  ALBUMIN 2.9*  --  4.0 3.7   Recent Labs    08/28/20 1426 12/09/20 1138  LIPASE 46 32   No results for input(s): AMMONIA in the last 8760 hours. CBC: Recent Labs    04/03/21 1207 04/03/21 1332 06/23/21 1056 06/26/21 1607  WBC 17.5* 11.2* 8.5 7.6  NEUTROABS 9,328*  --  6.4 5.1  HGB 14.2 14.8 14.1 14.0  HCT 42.3 45.6 41.7 42.0  MCV 89.4 91.4 87.8 88.7  PLT 331 291 219 245.0   Lipid Panel: No results for input(s): CHOL, HDL, LDLCALC, TRIG, CHOLHDL, LDLDIRECT in the last 8760 hours. TSH: Recent Labs    01/15/21 0500 06/26/21 1607  TSH 2.990 4.42   A1C: No results found for: HGBA1C   Assessment/Plan  1. Edema, unspecified type I suspect her edema is not related to heart or kidney or lung disease but more likely venous stasis.  Will double torsemide for 1 week and have her back then to recheck renal function and electrolytes.  I would like to get her to the point where she can add the extra diuretic based on her daily weight comparing 1 day to the previous    4. Stage 3a chronic kidney disease ([place]) Reviewing labs, renal  function has been stable for at least the past 6 months but need to watch it as we titrate her diuretic      Alain Honey, MD [place] [phone]

## 2021-08-12 ENCOUNTER — Ambulatory Visit: Payer: Medicare PPO

## 2021-08-14 ENCOUNTER — Other Ambulatory Visit: Payer: Medicare PPO

## 2021-08-14 ENCOUNTER — Other Ambulatory Visit: Payer: Self-pay

## 2021-08-14 DIAGNOSIS — R609 Edema, unspecified: Secondary | ICD-10-CM

## 2021-08-15 ENCOUNTER — Ambulatory Visit
Admission: RE | Admit: 2021-08-15 | Discharge: 2021-08-15 | Disposition: A | Discharge status: Home / Self Care | Payer: Medicare PPO | Source: Ambulatory Visit | Attending: Nurse Practitioner | Admitting: Nurse Practitioner

## 2021-08-15 DIAGNOSIS — Z1231 Encounter for screening mammogram for malignant neoplasm of breast: Secondary | ICD-10-CM | POA: Diagnosis not present

## 2021-08-15 LAB — BASIC METABOLIC PANEL WITH GFR
BUN/Creatinine Ratio: 24 (calc) — ABNORMAL HIGH (ref 6–22)
BUN: 31 mg/dL — ABNORMAL HIGH (ref 7–25)
CO2: 27 mmol/L (ref 20–32)
Calcium: 9.2 mg/dL (ref 8.6–10.4)
Chloride: 100 mmol/L (ref 98–110)
Creat: 1.28 mg/dL — ABNORMAL HIGH (ref 0.60–1.00)
Glucose, Bld: 91 mg/dL (ref 65–99)
Potassium: 3.5 mmol/L (ref 3.5–5.3)
Sodium: 138 mmol/L (ref 135–146)
eGFR: 44 mL/min/{1.73_m2} — ABNORMAL LOW (ref >=60)

## 2021-08-28 ENCOUNTER — Other Ambulatory Visit: Payer: Self-pay

## 2021-08-28 ENCOUNTER — Encounter: Payer: Self-pay | Admitting: Psychiatry

## 2021-08-28 ENCOUNTER — Ambulatory Visit (INDEPENDENT_AMBULATORY_CARE_PROVIDER_SITE_OTHER): Payer: Medicare PPO | Admitting: Psychiatry

## 2021-08-28 VITALS — Wt 135.0 lb

## 2021-08-28 DIAGNOSIS — F3162 Bipolar disorder, current episode mixed, moderate: Secondary | ICD-10-CM | POA: Diagnosis not present

## 2021-08-28 DIAGNOSIS — F99 Mental disorder, not otherwise specified: Secondary | ICD-10-CM | POA: Diagnosis not present

## 2021-08-28 DIAGNOSIS — F419 Anxiety disorder, unspecified: Secondary | ICD-10-CM | POA: Diagnosis not present

## 2021-08-28 DIAGNOSIS — F5105 Insomnia due to other mental disorder: Secondary | ICD-10-CM | POA: Diagnosis not present

## 2021-08-28 MED ORDER — MIRTAZAPINE 30 MG PO TABS: 30.0000 mg | ORAL_TABLET | Freq: Every day | ORAL | 1 refills | Status: DC

## 2021-08-28 MED ORDER — LAMOTRIGINE 200 MG PO TABS: 200.0000 mg | ORAL_TABLET | Freq: Every day | ORAL | 0 refills | Status: DC

## 2021-08-28 MED ORDER — BUPROPION HCL ER (XL) 150 MG PO TB24: 150.0000 mg | ORAL_TABLET | Freq: Every day | ORAL | 1 refills | Status: DC

## 2021-08-28 NOTE — Progress Notes (Signed)
Megan Brewer [id number] 08-Jan-1946 76 y.o.  Subjective:   Patient ID:  Megan Brewer is a 76 y.o. (DOB 10-21-1945) female.  Chief Complaint:  Chief Complaint  Patient presents with   Depression    HPI [name] presents to the office today for follow-up of depression, anxiety, and insomnia. She reports that she has not been able to laugh and cry "which makes the depression worse." She reports that she is not enjoying things and not able to fully engage in things. She reports that her "children don't like it and my grandchildren are scared of me because I don't smile."  She reports feeling "overwhelmingly sad." She reports that "something has surfaced from 55 years ago." She reports having intrusive memories and re-experiencing emotions from that time. Denies nightmares. She reports anxiety with interpersonal interactions. Denies panic attacks. Denies significant irritability. She reports poor sleep. Difficulty staying asleep. She reports that she can sleep 8 hours with 5-6 awakenings. She reports increased appetite and weight gain. Reports that she has gained 15 lbs since Thanksgiving. Energy is "either the same or worse." She reports "I want to do things because I am trapped in the house and want to get out, but I don't want to do things because of people." Anhedonia. Able to focus with intention. Denies any elevated moods or manic s/s. She reports that she can concentrate with effort.  Denies AH or VH.   Denies SI.   Never had a seizure.    Past Psychiatric Medication Trials: Sertraline- Was effective for about 25 years and no longer seems to be as effective. Prozac Paxil Cymbalta- may have caused psychosis. Remeron-Helpful for her mood and caused her legs to give out.  Depakote Trileptal- shaking and night sweats Gabapentin- Had throat swelling Lithium- Edema. Minimally effective Seroquel Risperidone Zyprexa- Effective for insomnia, anxiety, and mood.  Excessive wt gain.  Saphris Lamotrigine Melatonin Ativan- Reports taking until 2 months ago. Valium- "great for my depression." Xanax Trazodone- Ineffective Requip- Took for years at 0.5 mg po qd. Ingrezza- multiple side effects  AIMS    Flowsheet Row Office Visit from 02/25/2021 in [place] Office Visit from 06/14/2020 in [place] Visit from 06/23/2019 in [place] Total Score 5 15 0      PHQ2-9    [place] Office Visit from 02/04/2021 in [facility] Clinical Support from 09/27/2020 in [place] Visit from 06/10/2020 in [place] Visit from 09/25/2019 in [place] Visit from 07/05/2019 in [place]  PHQ-2 Total Score 0 0 0 0 0      [place] [facility] from 06/23/2021 in [place] [facility] from 04/03/2021 in [place] DEPT Admission (Discharged) from 02/04/2021 in [place] [facility]  C-SSRS RISK CATEGORY No Risk No Risk No Risk        Review of Systems:  Review of Systems  Constitutional:  Positive for fatigue.  Gastrointestinal:  Negative for diarrhea.  Musculoskeletal:  Negative for gait problem.  Neurological:  Positive for tremors and weakness. Negative for headaches.  Psychiatric/Behavioral:         Please refer to HPI   Medications: I have reviewed the patient's current medications.  Current Outpatient Medications  Medication Sig Dispense Refill   acetaminophen (TYLENOL) 500 MG tablet Take 1-2 tablets (500-1,000 mg total) by mouth every 8 (eight)  hours as needed (Max dose 3000 mg in 24 hours).     azelastine (ASTELIN) 0.1 % nasal spray Place 2 sprays into both nostrils 2 (two) times daily. 30 mL 5   baclofen (LIORESAL) 10 MG tablet Take 10 mg by mouth daily as needed for muscle spasms.     buPROPion (WELLBUTRIN  XL) 150 MG 24 hr tablet Take 1 tablet (150 mg total) by mouth daily. 30 tablet 1   Calcium Citrate 200 MG TABS Take 200 mg of elemental calcium by mouth daily.     carbidopa-levodopa (SINEMET IR) 25-100 MG tablet Take as needed for severe restless leg syndrome. 30 tablet 2   cholecalciferol (VITAMIN D3) 25 MCG (1000 UNIT) tablet Take 1,000 Units by mouth daily.     cycloSPORINE (RESTASIS) 0.05 % ophthalmic emulsion Place 1 drop into both eyes 2 (two) times daily.     dexlansoprazole (DEXILANT) 60 MG capsule Take 1 capsule (60 mg total) by mouth daily. Crack open the capsule and pour into spoon prior to ingestion. 30 capsule 11   famotidine (PEPCID) 20 MG tablet One after supper 30 tablet 11   ferrous sulfate 325 (65 FE) MG tablet Take 650 mg by mouth daily with breakfast.     heparin lock flush 100 UNIT/ML SOLN injection Inject 500 Units into the vein every 21 ( twenty-one) days.     ibuprofen (ADVIL) 200 MG tablet Take 200 mg by mouth daily.     immune globulin, human, (GAMMAGARD S/D LESS IGA) 10 g injection Inject 2 g/kg into the vein every 21 ( twenty-one) days.     KRILL OIL PO Take by mouth.     levothyroxine (SYNTHROID) 75 MCG tablet TAKE ONE (1) TABLET BY MOUTH EACH DAY 30MINUTES BEFORE BREAKFAST ON EMPTY STOMACH 90 tablet 1   magnesium oxide (MAG-OX) 400 MG tablet Take 400 mg by mouth daily.      melatonin 5 MG TABS Take 5 mg by mouth.     oxybutynin (DITROPAN) 5 MG/5ML syrup Take 2.5 mLs (2.5 mg total) by mouth 2 (two) times daily. 473 mL 1   potassium chloride (KLOR-CON) 10 MEQ tablet TAKE ONE (1) TABLET BY MOUTH FOUR (4) TIMES DAILY 120 tablet 5   rOPINIRole (REQUIP) 1 MG tablet Take 1.5 tablet 2-3 hour prior to bedtime. 135 tablet 3   sucralfate (CARAFATE) 1 GM/10ML suspension TAKE 2 TEASPOONSFUL BY MOUTH THREE TIME DAILY AS NEEDED 420 mL 1   tamsulosin (FLOMAX) 0.4 MG CAPS capsule Take 1 capsule (0.4 mg total) by mouth daily. 30 capsule 1   torsemide (DEMADEX) 20 MG tablet TAKE 1  TABLET BY MOUTH DAILY (Patient taking differently: 20 mg 2 (two) times daily.) 90 tablet 3   triamcinolone cream (KENALOG) 0.1 % Apply 1 application topically 2 (two) times daily as needed (rash).     UNABLE TO FIND Take 1 tablet by mouth daily. Med Name: [name] 1 by mouth daily for UTI prevention     UNABLE TO FIND Hemp Gummies     vitamin B-12 (CYANOCOBALAMIN) 1000 MCG tablet TAKE 1 TABLET BY MOUTH DAILY     ZINC OXIDE, TOPICAL, 10 % CREA Apply 1 application topically 2 (two) times daily as needed (rash).     acetaminophen (TYLENOL) 500 MG tablet Take 500 mg by mouth every 6 (six) hours as needed for moderate pain.     Erenumab-aooe (AIMOVIG, 140 MG DOSE,) 70 MG/ML SOAJ Inject 70 mg into the skin every 30 (thirty) days. (Patient not  taking: Reported on 08/28/2021)     estradiol (ESTRACE) 0.1 MG/GM vaginal cream Place 1 Applicatorful vaginally 3 (three) times a week. (Patient not taking: Reported on 08/28/2021)     hydrOXYzine (VISTARIL) 25 MG capsule Take 1 capsule (25 mg total) by mouth daily as needed (panic). 30 capsule 5   lamoTRIgine (LAMICTAL) 200 MG tablet Take 1 tablet (200 mg total) by mouth at bedtime. 90 tablet 0   loperamide (IMODIUM A-D) 2 MG tablet Take 2 mg by mouth 4 (four) times daily as needed for diarrhea or loose stools.     mirtazapine (REMERON) 30 MG tablet Take 1 tablet (30 mg total) by mouth at bedtime. 90 tablet 1   saccharomyces boulardii (FLORASTOR) 250 MG capsule Take 1 capsule (250 mg total) by mouth 2 (two) times daily. (Patient not taking: Reported on 08/28/2021) 30 capsule 3   sodium chloride, PF, 0.9 % injection Inject 5-10 mLs into the vein as needed (Immunoglobulin).     No current facility-administered medications for this visit.    Medication Side Effects: None  Allergies:  Allergies  Allergen Reactions   Imitrex [Sumatriptan] Nausea And Vomiting    Ringing in the ears, migraines are worse   Tetracycline Nausea Only    Causes ringing in ears, dizziness,  migraine, nausea   Corticosteroids Other (See Comments)    12/02/2016 interview by XVQ: Oral dosing causes migraines/nausea/tinnitus. Prev tolerated IV and intranasal admin w/o difficulty.   Cefixime Other (See Comments)    Headache    Ciprofloxacin Other (See Comments)    Headache, dizziness, ringing in the ears   Doxycycline Other (See Comments)    unknown   Gabapentin     Throat swells/closes   Isometheptene-Dichloral-Apap Other (See Comments)    unknown   Ketorolac     12/02/2016 interview by MGQ: Oral dosing prednisone/corticosteroids causes migraines/nausea/tinnitus. Prev tolerated IV and intranasal admin w/o difficulty.   Prednisone Other (See Comments)    Migraine, dizzy, ringing in ears-can take it IV 12/02/2016 interview by [name]: Oral prednisone dosing causes migraines/nausea/tinnitus. Prev tolerated IV and intranasal admin w/o difficulty.   Promethazine Other (See Comments)    causes severe abdominal pain when taken IV; however she can take PO or IM   Stadol [Butorphanol]     Cerebral pain   Tape Other (See Comments)    Adhesive tapes-patient denies   Erythromycin Base Diarrhea and Itching    Severe diarrhea   Propoxyphene Nausea Only    Past Medical History:  Diagnosis Date   Anemia    Arachnoiditis    Bipolar disorder (HCC)    C. 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    Please see review of systems for further details on the patient's review from today.   Objective:   Physical Exam:  Wt 135 lb (61.2 kg)    BMI 23.17 kg/m   Physical Exam Constitutional:      General: She is not in acute distress. Musculoskeletal:        General: No deformity.  Neurological:     Mental Status: She is alert and oriented to person, place, and time.     Coordination: Coordination normal.  Psychiatric:        Attention and Perception: Attention and perception normal. She does not perceive auditory or visual hallucinations.        Mood and Affect: Mood is depressed. Mood is not anxious. Affect is not labile, blunt, angry or inappropriate.        Speech: Speech normal.        Behavior: Behavior is slowed.        Thought Content: Thought content normal. Thought content is not paranoid or delusional. Thought content does not include homicidal or suicidal ideation. Thought content does not include homicidal or suicidal plan.        Cognition and Memory: Cognition and memory normal.        Judgment: Judgment normal.     Comments: Insight intact Dysphoric mood    Lab Review:     Component Value Date/Time   NA 138 08/14/2021 1059   K 3.5 08/14/2021 1059   CL 100 08/14/2021 1059   CO2 27 08/14/2021 1059   GLUCOSE 91 08/14/2021 1059   BUN 31 (H) 08/14/2021 1059   CREATININE 1.28 (H) 08/14/2021 1059   CALCIUM 9.2 08/14/2021 1059   PROT 7.1 06/23/2021 1056   ALBUMIN 3.7 06/23/2021 1056   AST 20 06/23/2021 1056   ALT 14 06/23/2021 1056   ALKPHOS 73 06/23/2021 1056   BILITOT 0.3 06/23/2021 1056   GFRNONAA >60 06/23/2021 1056   GFRNONAA 33 (L) 06/10/2020 1549   GFRAA 38 (L) 06/10/2020 1549       Component Value Date/Time   WBC 7.6 06/26/2021 1607   RBC 4.74 06/26/2021 1607   HGB 14.0 06/26/2021 1607   HCT 42.0 06/26/2021 1607   PLT 245.0 06/26/2021 1607   MCV 88.7 06/26/2021 1607   MCH 29.7 06/23/2021 1056   MCHC 33.3 06/26/2021 1607   RDW 13.2 06/26/2021  1607   LYMPHSABS 1.7 06/26/2021 1607   MONOABS 0.6 06/26/2021 1607   EOSABS 0.2 06/26/2021 1607   BASOSABS 0.1 06/26/2021 1607    No results found for: POCLITH, LITHIUM   No results found for: PHENYTOIN, PHENOBARB, VALPROATE, CBMZ   .res Assessment: Plan:    Pt seen for 30 minutes and time spent discussing possible treatment options to include potential benefits, risks, and side effects of Wellbutrin XL. Pt agrees to trial of Wellbutrin XL 150 mg po qd for depression. Advised pt to contact office if she experience increased irritability, anxiety, or manic s/s.  Continue Lamictal 200 mg po qd for mood s/s.  Continue Remeron 30 mg po QHS for insomnia and depression.  Continue Hydroxyzine prn anxiety.  Pt to follow-up in 4-6 weeks or sooner if clinically indicated.  Patient advised to contact office with any questions, adverse effects, or acute worsening in signs and symptoms.   Ludella was seen today for depression.  Diagnoses and all orders for this visit:  Bipolar disorder, current episode mixed, moderate (HCC) -     buPROPion (WELLBUTRIN XL) 150 MG 24 hr tablet; Take 1 tablet (150 mg total) by mouth daily. -     lamoTRIgine (LAMICTAL) 200 MG tablet; Take  1 tablet (200 mg total) by mouth at bedtime.  Insomnia due to other mental disorder -     mirtazapine (REMERON) 30 MG tablet; Take 1 tablet (30 mg total) by mouth at bedtime.  Anxiety disorder, unspecified type -     mirtazapine (REMERON) 30 MG tablet; Take 1 tablet (30 mg total) by mouth at bedtime.     Please see After Visit Summary for patient specific instructions.  Future Appointments  Date Time Provider [place]  09/30/2021 11:00 AM [name], NP [facility] None  10/09/2021 11:20 AM Warren Lacy, PA-C [facility]  10/10/2021 11:00 AM Thayer Headings, PMHNP [facility] None  10/24/2021 11:15 AM [facility] NURSE [facility] None  11/14/2021  3:30 PM Narda Amber K, DO [facility] None  12/29/2021  3:15 PM  [name], NP [facility] None    No orders of the defined types were placed in this encounter.   -------------------------------

## 2021-09-02 ENCOUNTER — Other Ambulatory Visit: Payer: Self-pay | Admitting: Nurse Practitioner

## 2021-09-02 DIAGNOSIS — E039 Hypothyroidism, unspecified: Secondary | ICD-10-CM

## 2021-09-02 DIAGNOSIS — N3020 Other chronic cystitis without hematuria: Secondary | ICD-10-CM | POA: Diagnosis not present

## 2021-09-02 DIAGNOSIS — R3914 Feeling of incomplete bladder emptying: Secondary | ICD-10-CM | POA: Diagnosis not present

## 2021-09-09 ENCOUNTER — Other Ambulatory Visit: Payer: Self-pay | Admitting: Nurse Practitioner

## 2021-09-19 ENCOUNTER — Telehealth: Payer: Self-pay

## 2021-09-19 DIAGNOSIS — M79605 Pain in left leg: Secondary | ICD-10-CM

## 2021-09-19 DIAGNOSIS — M79604 Pain in right leg: Secondary | ICD-10-CM

## 2021-09-19 MED ORDER — TORSEMIDE 20 MG PO TABS: 20.0000 mg | ORAL_TABLET | Freq: Two times a day (BID) | ORAL | 2 refills | Status: DC

## 2021-09-19 NOTE — Telephone Encounter (Signed)
Patient called for a rx refill. Medication send into pharmacy.

## 2021-09-30 ENCOUNTER — Other Ambulatory Visit: Payer: Self-pay

## 2021-09-30 ENCOUNTER — Encounter: Payer: Self-pay | Admitting: Nurse Practitioner

## 2021-09-30 ENCOUNTER — Telehealth: Payer: Self-pay

## 2021-09-30 ENCOUNTER — Ambulatory Visit (INDEPENDENT_AMBULATORY_CARE_PROVIDER_SITE_OTHER): Payer: Medicare PPO | Admitting: Nurse Practitioner

## 2021-09-30 DIAGNOSIS — Z0000 Encounter for general adult medical examination without abnormal findings: Secondary | ICD-10-CM

## 2021-09-30 DIAGNOSIS — E2839 Other primary ovarian failure: Secondary | ICD-10-CM

## 2021-09-30 NOTE — Progress Notes (Signed)
This service is provided via telemedicine  No vital signs collected/recorded due to the encounter was a telemedicine visit.   Location of patient (ex: home, work):  Home  Patient consents to a telephone visit:  Yes, see encounter dated 09/30/2021  Location of the provider (ex: office, home):  [place]  Name of any referring provider:  N/A  Names of all persons participating in the telemedicine service and their role in the encounter:  Sherrie Mustache, Nurse Practitioner, Carroll Kinds, CMA, and patient.    Time spent on call:  11 minutes with medical assistant

## 2021-09-30 NOTE — Progress Notes (Signed)
Subjective:   Megan Brewer is a 76 y.o. female who presents for Medicare Annual (Subsequent) preventive examination.  Review of Systems     Cardiac Risk Factors include: sedentary lifestyle;advanced age (>27men, >50 women);family history of premature cardiovascular disease     Objective:    Today's Vitals   09/30/21 1118  PainSc: 8    There is no height or weight on file to calculate BMI.  Advanced Directives 09/30/2021 07/14/2021 06/30/2021 06/23/2021 04/03/2021 03/06/2021 02/04/2021  Does Patient Have a Medical Advance Directive? No No Yes No No No No  Type of Advance Directive - - [place]  Does patient want to make changes to medical advance directive? - - No - Patient declined - - - -  Copy of [place] in Chart? - - Yes - validated most recent copy scanned in chart (See row information) - - - -  Would patient like information on creating a medical advance directive? - - - No - Patient declined No - Patient declined - No - Patient declined    Current Medications (verified) Outpatient Encounter Medications as of 09/30/2021  Medication Sig   acetaminophen (TYLENOL) 500 MG tablet Take 500 mg by mouth every 6 (six) hours as needed for moderate pain.   azelastine (ASTELIN) 0.1 % nasal spray Place 2 sprays into both nostrils 2 (two) times daily.   baclofen (LIORESAL) 10 MG tablet Take 10 mg by mouth daily as needed for muscle spasms.   buPROPion (WELLBUTRIN XL) 150 MG 24 hr tablet Take 1 tablet (150 mg total) by mouth daily.   Calcium Citrate 200 MG TABS Take 200 mg of elemental calcium by mouth daily.   carbidopa-levodopa (SINEMET IR) 25-100 MG tablet Take as needed for severe restless leg syndrome.   cholecalciferol (VITAMIN D3) 25 MCG (1000 UNIT) tablet Take 1,000 Units by mouth daily.   cycloSPORINE (RESTASIS) 0.05 % ophthalmic emulsion Place 1 drop into both eyes 2 (two) times daily.   dexlansoprazole (DEXILANT) 60 MG capsule  Take 1 capsule (60 mg total) by mouth daily. Crack open the capsule and pour into spoon prior to ingestion.   famotidine (PEPCID) 20 MG tablet One after supper   ferrous sulfate 325 (65 FE) MG tablet Take 650 mg by mouth daily with breakfast.   heparin lock flush 100 UNIT/ML SOLN injection Inject 500 Units into the vein every 21 ( twenty-one) days.   ibuprofen (ADVIL) 200 MG tablet Take 200 mg by mouth as needed.   immune globulin, human, (GAMMAGARD S/D LESS IGA) 10 g injection Inject 2 g/kg into the vein every 21 ( twenty-one) days.   KRILL OIL PO Take by mouth.   lamoTRIgine (LAMICTAL) 200 MG tablet Take 1 tablet (200 mg total) by mouth at bedtime.   levothyroxine (SYNTHROID) 75 MCG tablet TAKE ONE (1) TABLET BY MOUTH EACH DAY 30MINUTES BEFORE BREAKFAST ON EMPTY STOMACH   loperamide (IMODIUM A-D) 2 MG tablet Take 2 mg by mouth 4 (four) times daily as needed for diarrhea or loose stools.   magnesium oxide (MAG-OX) 400 MG tablet Take 400 mg by mouth daily.    melatonin 5 MG TABS Take 5 mg by mouth.   mirtazapine (REMERON) 30 MG tablet Take 1 tablet (30 mg total) by mouth at bedtime.   oxybutynin (DITROPAN) 5 MG/5ML syrup TAKE 2.5 MLS BY MOUTH TWICE A DAY   potassium chloride (KLOR-CON) 10 MEQ tablet TAKE ONE (1) TABLET BY MOUTH  FOUR (4) TIMES DAILY   rOPINIRole (REQUIP) 1 MG tablet Take 1.5 tablet 2-3 hour prior to bedtime.   sodium chloride, PF, 0.9 % injection Inject 5-10 mLs into the vein as needed (Immunoglobulin).   sucralfate (CARAFATE) 1 GM/10ML suspension TAKE 2 TEASPOONSFUL BY MOUTH THREE TIME DAILY AS NEEDED   tamsulosin (FLOMAX) 0.4 MG CAPS capsule Take 1 capsule (0.4 mg total) by mouth daily.   torsemide (DEMADEX) 20 MG tablet Take 1 tablet (20 mg total) by mouth 2 (two) times daily.   triamcinolone cream (KENALOG) 0.1 % Apply 1 application topically 2 (two) times daily as needed (rash).   UNABLE TO FIND Take 1 tablet by mouth daily. Med Name: [name] 1 by mouth daily for UTI  prevention   UNABLE TO FIND Hemp Gummies   vitamin B-12 (CYANOCOBALAMIN) 1000 MCG tablet TAKE 1 TABLET BY MOUTH DAILY   ZINC OXIDE, TOPICAL, 10 % CREA Apply 1 application topically 2 (two) times daily as needed (rash).   [DISCONTINUED] acetaminophen (TYLENOL) 500 MG tablet Take 1-2 tablets (500-1,000 mg total) by mouth every 8 (eight) hours as needed (Max dose 3000 mg in 24 hours).   [DISCONTINUED] hydrOXYzine (VISTARIL) 25 MG capsule Take 1 capsule (25 mg total) by mouth daily as needed (panic).   [DISCONTINUED] Erenumab-aooe (AIMOVIG, 140 MG DOSE,) 70 MG/ML SOAJ Inject 70 mg into the skin every 30 (thirty) days. (Patient not taking: Reported on 08/28/2021)   [DISCONTINUED] estradiol (ESTRACE) 0.1 MG/GM vaginal cream Place 1 Applicatorful vaginally 3 (three) times a week. (Patient not taking: Reported on 08/28/2021)   [DISCONTINUED] saccharomyces boulardii (FLORASTOR) 250 MG capsule Take 1 capsule (250 mg total) by mouth 2 (two) times daily. (Patient not taking: Reported on 08/28/2021)   No facility-administered encounter medications on file as of 09/30/2021.    Allergies (verified) Imitrex [sumatriptan], Tetracycline, Corticosteroids, Cefixime, Ciprofloxacin, Doxycycline, Gabapentin, Isometheptene-dichloral-apap, Ketorolac, Prednisone, Promethazine, Stadol [butorphanol], Tape, Erythromycin base, and Propoxyphene   History: Past Medical History:  Diagnosis Date   Anemia    Arachnoiditis    Bipolar disorder (HCC)    C. 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[place])    Lung cancer ([place]) 2011, 2014   Memory loss    Migraine    Opioid abuse ([place])    Osteoporosis    Presence of neurostimulator    Restless legs    SIRS (systemic inflammatory  response syndrome) ([place])    Sleep apnea    Past Surgical History:  Procedure Laterality Date   ABDOMINAL HYSTERECTOMY     CARPAL TUNNEL RELEASE     CATARACT EXTRACTION Bilateral 2019   CHOLECYSTECTOMY     COLONOSCOPY  2015   [facility]   CT LUNG SCREENING  2018   DENTAL SURGERY  06/17/2021   4 teeth rmoved   DG  BONE DENSITY ([place] [facility])  2018   DIAGNOSTIC MAMMOGRAM  2019   GASTRIC BYPASS     LUNG CANCER SURGERY  02/2010   MULTIPLE TOOTH EXTRACTIONS     PORT A CATH REVISION Right 04/2021   PORT-A-CATH REMOVAL Left 02/07/2021   Procedure: REMOVAL PORT-A-CATH;  Surgeon: Kieth Brightly Arta Bruce, MD;  Location: [facility];  Service: General;  Laterality: Left;  60 MINUTES ROOM  4   SPINAL CORD STIMULATOR IMPLANT     Family History  Problem Relation Age of Onset   Hypertension Mother    GER disease Mother    Dementia Mother    Osteoporosis Mother    Arthritis Mother    Bipolar disorder Mother    Parkinson's disease Father    Congestive Heart Failure Father    Pneumonia Father    Arthritis Father    Dementia Father    Depression Father    Asthma Sister    Depression Sister    Bipolar disorder Brother    Asthma Daughter    Colon cancer Neg Hx    Esophageal cancer Neg Hx    Stomach cancer Neg Hx    Rectal cancer Neg Hx    Social History   Socioeconomic History   Marital status: Married    Spouse name: Not on file   Number of children: 2   Years of education: Not on file   Highest education level: GED or equivalent  Occupational History   Occupation: retired  Tobacco Use   Smoking status: Never   Smokeless tobacco: Never  Vaping Use   Vaping Use: Never used  Substance and Sexual Activity   Alcohol use: Not Currently   Drug use: Never   Sexual activity: Not on file  Other Topics Concern   Not on file  Social History Narrative   Diet: None      Caffeine: coffee, tea, sodas less than or 1 daily.      Married, if yes what year: Yes, 1972      Do you live in a house,  apartment, assisted living, condo, trailer, ect: Two story house       Pets: None      Current/Past profession: Teach      Exercise: None         Living Will: No   DNR: No   POA/HPOA: No      Functional Status:   Do you have difficulty bathing or dressing yourself? yes   Do you have difficulty preparing food or eating? yes   Do you have difficulty managing your medications? yes   Do you have difficulty managing your finances?yes   Do you have difficulty affording your medications? No      Right Handed    Lives in a two story home    Social Determinants of Health   Financial Resource Strain: Not on file  Food Insecurity: Not on file  Transportation Needs: Not on file  Physical Activity: Not on file  Stress: Not on file  Social Connections: Not on file    Tobacco Counseling Counseling given: Not Answered   Clinical Intake:  Pre-visit preparation completed: Yes  Pain : 0-10 Pain Score: 8  Pain Type: Chronic pain Pain Location: Rib cage Pain Orientation: Right     BMI - recorded: 23 Nutritional Status: BMI of 19-24  Normal Diabetes: No  How often do you need to have someone help you when you read instructions, pamphlets, or other written materials from your doctor or pharmacy?: 1 - Never  Diabetic?no         Activities of Daily Living In your present state of health, do you have any difficulty performing the following activities: 09/30/2021 02/04/2021  Hearing? Y N  Vision? N N  Difficulty concentrating or making decisions? N Y  Walking or climbing stairs? N Y  Comment - -  Dressing or bathing? N N  Doing  errands, shopping? Y N  Preparing Food and eating ? Y -  Using the Toilet? N -  In the past six months, have you accidently leaked urine? Y -  Do you have problems with loss of bowel control? N -  Managing your Medications? N -  Managing your Finances? N -  Housekeeping or managing your Housekeeping? N -  Some recent data might be hidden     Patient Care Team: [name], NP as PCP - General (Geriatric Medicine) Alda Berthold, DO as Consulting Physician (Neurology)  Indicate any recent Medical Services you may have received from other than [facility] providers in the past year (date may be approximate).     Assessment:   This is a routine wellness examination for Gertie.  Hearing/Vision screen Hearing Screening - Comments:: Patient wears hearing aids Vision Screening - Comments:: Patient wears glasses. Patient had eye exam a year ago. Patient sees Dr. Alpha Gula  Dietary issues and exercise activities discussed: Current Exercise Habits: Structured exercise class, Type of exercise: stretching;calisthenics;strength training/weights, Time (Minutes): 30, Frequency (Times/Week): 3, Weekly Exercise (Minutes/Week): 90   Goals Addressed   None    Depression Screen PHQ 2/9 Scores 09/30/2021 06/30/2021 02/04/2021 09/27/2020 06/10/2020 09/25/2019 07/05/2019  PHQ - 2 Score 0 - 0 0 0 0 0  Exception Documentation - Other- indicate reason in comment box - - - (No Data) -  Not completed - Seeing psych and stable x 25 years per patient - - - - -    Fall Risk Fall Risk  09/30/2021 08/06/2021 07/14/2021 06/30/2021 04/03/2021  Falls in the past year? 0 0 0 0 0  Comment - - - - -  Number falls in past yr: 0 0 0 0 0  Injury with Fall? 0 0 0 0 0  Risk for fall due to : No Fall Risks No Fall Risks - No Fall Risks History of fall(s)  Follow up Falls evaluation completed Falls evaluation completed;Education provided;Falls prevention discussed - Falls evaluation completed Falls evaluation completed;Education provided;Falls prevention discussed    FALL RISK PREVENTION PERTAINING TO THE HOME:  Any stairs in or around the home? Yes  If so, are there any without handrails? Yes  Home free of loose throw rugs in walkways, pet beds, electrical cords, etc? Yes  Adequate lighting in your home to reduce risk of falls? Yes   ASSISTIVE DEVICES  UTILIZED TO PREVENT FALLS:  Life alert? No  Use of a cane, walker or w/c? No  Grab bars in the bathroom? No  Shower chair or bench in shower? Yes  Elevated toilet seat or a handicapped toilet? No   TIMED UP AND GO:  Was the test performed? No .   Cognitive Function:     6CIT Screen 09/30/2021 09/27/2020 09/25/2019  What Year? 0 points 0 points 0 points  What month? 0 points 0 points 0 points  What time? 0 points 0 points 0 points  Count back from 20 0 points 2 points 0 points  Months in reverse 0 points 0 points 0 points  Repeat phrase 0 points 0 points 2 points  Total Score 0 2 2    Immunizations Immunization History  Administered Date(s) Administered   Fluad Quad(high Dose 65+) 06/06/2019, 05/13/2020, 05/21/2021   Influenza, High Dose Seasonal PF 06/06/2019, 05/13/2020, 05/21/2021   Influenza, Seasonal, Injecte, Preservative Fre 11/14/2012   Influenza,inj,Quad PF,6+ Mos 05/24/2018   Influenza-Unspecified 11/14/2012, 04/17/2016, 06/09/2017, 05/24/2018   PFIZER Comirnaty(Gray Top)Covid-19 Tri-Sucrose Vaccine 09/07/2019, 09/28/2019,  03/30/2020, 12/03/2020   PFIZER(Purple Top)SARS-COV-2 Vaccination 09/07/2019, 09/28/2019, 03/30/2020   Pfizer Covid-19 Vaccine Bivalent Booster 34yrs & up 05/21/2021   Pneumococcal Conjugate-13 07/29/2017   Pneumococcal Polysaccharide-23 07/29/2017, 06/10/2020   Td 05/30/2020   Tdap 05/30/2017   Zoster Recombinat (Shingrix) 06/17/2017, 07/25/2021   Zoster, Unspecified 06/17/2017    TDAP status: Up to date  Flu Vaccine status: Up to date  Pneumococcal vaccine status: Up to date  Covid-19 vaccine status: Completed vaccines  Qualifies for Shingles Vaccine? Yes   Zostavax completed Yes   Shingrix Completed?: Yes  Screening Tests Health Maintenance  Topic Date Due   MAMMOGRAM  08/16/2023   COLONOSCOPY (Pts 45-59yrs Insurance coverage will need to be confirmed)  01/01/2027   TETANUS/TDAP  05/30/2030   Pneumonia Vaccine 34+ Years old   Completed   INFLUENZA VACCINE  Completed   DEXA SCAN  Completed   COVID-19 Vaccine  Completed   Hepatitis C Screening  Completed   Zoster Vaccines- Shingrix  Completed   HPV VACCINES  Aged Out    Health Maintenance  There are no preventive care reminders to display for this patient.  Colorectal cancer screening: Type of screening: Colonoscopy. Completed 2021. Repeat every 7 years  Mammogram status: Completed 2022. Repeat every year  Bone Density status: Ordered today. Pt provided with contact info and advised to call to schedule appt.  Lung Cancer Screening: (Low Dose CT Chest recommended if Age 53-80 years, 30 pack-year currently smoking OR have quit w/in 15years.) does not qualify.   Lung Cancer Screening Referral: na  Additional Screening:  Hepatitis C Screening: does qualify; Completed   Vision Screening: Recommended annual ophthalmology exams for early detection of glaucoma and other disorders of the eye. Is the patient up to date with their annual eye exam?  Yes  Who is the provider or what is the name of the office in which the patient attends annual eye exams? [facility] If pt is not established with a provider, would they like to be referred to a provider to establish care? No .   Dental Screening: Recommended annual dental exams for proper oral hygiene  Community Resource Referral / Chronic Care Management: CRR required this visit?  No   CCM required this visit?  No      Plan:     I have personally reviewed and noted the following in the patients chart:   Medical and social history Use of alcohol, tobacco or illicit drugs  Current medications and supplements including opioid prescriptions.  Functional ability and status Nutritional status Physical activity Advanced directives List of other physicians Hospitalizations, surgeries, and ER visits in previous 12 months Vitals Screenings to include cognitive, depression, and falls Referrals and  appointments  In addition, I have reviewed and discussed with patient certain preventive protocols, quality metrics, and best practice recommendations. A written personalized care plan for preventive services as well as general preventive health recommendations were provided to patient.     [name], NP   09/30/2021    Virtual Visit via Telephone Note  I connected with patient 09/30/21 at 11:00 AM EST by telephone and verified that I am speaking with the correct person using two identifiers.  Location: Patient: home Provider: [facility]   I discussed the limitations, risks, security and privacy concerns of performing an evaluation and management service by telephone and the availability of in person appointments. I also discussed with the patient that there may be a patient responsible charge related to this service.  The patient expressed understanding and agreed to proceed.   I discussed the assessment and treatment plan with the patient. The patient was provided an opportunity to ask questions and all were answered. The patient agreed with the plan and demonstrated an understanding of the instructions.   The patient was advised to call back or seek an in-person evaluation if the symptoms worsen or if the condition fails to improve as anticipated.  I provided 15 minutes of non-face-to-face time during this encounter.  Carlos American. Harle Battiest Avs printed and mailed

## 2021-09-30 NOTE — Patient Instructions (Signed)
Megan Brewer , Thank you for taking time to come for your Medicare Wellness Visit. I appreciate your ongoing commitment to your health goals. Please review the following plan we discussed and let me know if I can assist you in the future.   Screening recommendations/referrals: Colonoscopy up to date Mammogram up to date Bone Density ordered today- call [phone] to schedule Recommended yearly ophthalmology/optometry visit for glaucoma screening and checkup Recommended yearly dental visit for hygiene and checkup  Vaccinations: Influenza vaccine up to date Pneumococcal vaccine up to date Tdap vaccine up to date Shingles vaccine up to date    Advanced directives: recommended to complete and bring to office so we can place on file.   Conditions/risks identified: chronic pain, advance age.   Next appointment: yearly AWV   Preventive Care 7 Years and Older, Female Preventive care refers to lifestyle choices and visits with your health care provider that can promote health and wellness. What does preventive care include? A yearly physical exam. This is also called an annual well check. Dental exams once or twice a year. Routine eye exams. Ask your health care provider how often you should have your eyes checked. Personal lifestyle choices, including: Daily care of your teeth and gums. Regular physical activity. Eating a healthy diet. Avoiding tobacco and drug use. Limiting alcohol use. Practicing safe sex. Taking low-dose aspirin every day. Taking vitamin and mineral supplements as recommended by your health care provider. What happens during an annual well check? The services and screenings done by your health care provider during your annual well check will depend on your age, overall health, lifestyle risk factors, and family history of disease. Counseling  Your health care provider may ask you questions about your: Alcohol use. Tobacco use. Drug use. Emotional  well-being. Home and relationship well-being. Sexual activity. Eating habits. History of falls. Memory and ability to understand (cognition). Work and work Statistician. Reproductive health. Screening  You may have the following tests or measurements: Height, weight, and BMI. Blood pressure. Lipid and cholesterol levels. These may be checked every 5 years, or more frequently if you are over 33 years old. Skin check. Lung cancer screening. You may have this screening every year starting at age 81 if you have a 30-pack-year history of smoking and currently smoke or have quit within the past 15 years. Fecal occult blood test (FOBT) of the stool. You may have this test every year starting at age 30. Flexible sigmoidoscopy or colonoscopy. You may have a sigmoidoscopy every 5 years or a colonoscopy every 10 years starting at age 75. Hepatitis C blood test. Hepatitis B blood test. Sexually transmitted disease (STD) testing. Diabetes screening. This is done by checking your blood sugar (glucose) after you have not eaten for a while (fasting). You may have this done every 1-3 years. Bone density scan. This is done to screen for osteoporosis. You may have this done starting at age 62. Mammogram. This may be done every 1-2 years. Talk to your health care provider about how often you should have regular mammograms. Talk with your health care provider about your test results, treatment options, and if necessary, the need for more tests. Vaccines  Your health care provider may recommend certain vaccines, such as: Influenza vaccine. This is recommended every year. Tetanus, diphtheria, and acellular pertussis (Tdap, Td) vaccine. You may need a Td booster every 10 years. Zoster vaccine. You may need this after age 19. Pneumococcal 13-valent conjugate (PCV13) vaccine. One dose is recommended after age  65. Pneumococcal polysaccharide (PPSV23) vaccine. One dose is recommended after age 28. Talk to your  health care provider about which screenings and vaccines you need and how often you need them. This information is not intended to replace advice given to you by your health care provider. Make sure you discuss any questions you have with your health care provider. Document Released: 08/30/2015 Document Revised: 04/22/2016 Document Reviewed: 06/04/2015 Elsevier Interactive Patient Education  2017 [place] Prevention in the Home Falls can cause injuries. They can happen to people of all ages. There are many things you can do to make your home safe and to help prevent falls. What can I do on the outside of my home? Regularly fix the edges of walkways and driveways and fix any cracks. Remove anything that might make you trip as you walk through a door, such as a raised step or threshold. Trim any bushes or trees on the path to your home. Use bright outdoor lighting. Clear any walking paths of anything that might make someone trip, such as rocks or tools. Regularly check to see if handrails are loose or broken. Make sure that both sides of any steps have handrails. Any raised decks and porches should have guardrails on the edges. Have any leaves, snow, or ice cleared regularly. Use sand or salt on walking paths during winter. Clean up any spills in your garage right away. This includes oil or grease spills. What can I do in the bathroom? Use night lights. Install grab bars by the toilet and in the tub and shower. Do not use towel bars as grab bars. Use non-skid mats or decals in the tub or shower. If you need to sit down in the shower, use a plastic, non-slip stool. Keep the floor dry. Clean up any water that spills on the floor as soon as it happens. Remove soap buildup in the tub or shower regularly. Attach bath mats securely with double-sided non-slip rug tape. Do not have throw rugs and other things on the floor that can make you trip. What can I do in the bedroom? Use night  lights. Make sure that you have a light by your bed that is easy to reach. Do not use any sheets or blankets that are too big for your bed. They should not hang down onto the floor. Have a firm chair that has side arms. You can use this for support while you get dressed. Do not have throw rugs and other things on the floor that can make you trip. What can I do in the kitchen? Clean up any spills right away. Avoid walking on wet floors. Keep items that you use a lot in easy-to-reach places. If you need to reach something above you, use a strong step stool that has a grab bar. Keep electrical cords out of the way. Do not use floor polish or wax that makes floors slippery. If you must use wax, use non-skid floor wax. Do not have throw rugs and other things on the floor that can make you trip. What can I do with my stairs? Do not leave any items on the stairs. Make sure that there are handrails on both sides of the stairs and use them. Fix handrails that are broken or loose. Make sure that handrails are as long as the stairways. Check any carpeting to make sure that it is firmly attached to the stairs. Fix any carpet that is loose or worn. Avoid having throw rugs at  the top or bottom of the stairs. If you do have throw rugs, attach them to the floor with carpet tape. Make sure that you have a light switch at the top of the stairs and the bottom of the stairs. If you do not have them, ask someone to add them for you. What else can I do to help prevent falls? Wear shoes that: Do not have high heels. Have rubber bottoms. Are comfortable and fit you well. Are closed at the toe. Do not wear sandals. If you use a stepladder: Make sure that it is fully opened. Do not climb a closed stepladder. Make sure that both sides of the stepladder are locked into place. Ask someone to hold it for you, if possible. Clearly mark and make sure that you can see: Any grab bars or handrails. First and last  steps. Where the edge of each step is. Use tools that help you move around (mobility aids) if they are needed. These include: Canes. Walkers. Scooters. Crutches. Turn on the lights when you go into a dark area. Replace any light bulbs as soon as they burn out. Set up your furniture so you have a clear path. Avoid moving your furniture around. If any of your floors are uneven, fix them. If there are any pets around you, be aware of where they are. Review your medicines with your doctor. Some medicines can make you feel dizzy. This can increase your chance of falling. Ask your doctor what other things that you can do to help prevent falls. This information is not intended to replace advice given to you by your health care provider. Make sure you discuss any questions you have with your health care provider. Document Released: 05/30/2009 Document Revised: 01/09/2016 Document Reviewed: 09/07/2014 Elsevier Interactive Patient Education  2017 [employer].

## 2021-09-30 NOTE — Telephone Encounter (Signed)
[name], schumacher are scheduled for a virtual visit with your provider today.    Just as we do with appointments in the office, we must obtain your consent to participate.  Your consent will be active for this visit and any virtual visit you may have with one of our providers in the next 365 days.    If you have a MyChart account, I can also send a copy of this consent to you electronically.  All virtual visits are billed to your insurance company just like a traditional visit in the office.  As this is a virtual visit, video technology does not allow for your provider to perform a traditional examination.  This may limit your provider's ability to fully assess your condition.  If your provider identifies any concerns that need to be evaluated in person or the need to arrange testing such as labs, EKG, etc, we will make arrangements to do so.    Although advances in technology are sophisticated, we cannot ensure that it will always work on either your end or our end.  If the connection with a video visit is poor, we may have to switch to a telephone visit.  With either a video or telephone visit, we are not always able to ensure that we have a secure connection.   I need to obtain your verbal consent now.   Are you willing to proceed with your visit today?   Megan Brewer has provided verbal consent on 09/30/2021 for a virtual visit (video or telephone).   Carroll Kinds, CMA 09/30/2021  11:12 AM

## 2021-10-08 NOTE — Progress Notes (Signed)
Cardiology Office Note:    Date:  10/09/2021   ID:  Megan Brewer, Megan Brewer [birth year], MRN [id number]  PCP:  [name], NP [place] Cardiologist: Minus Breeding, MD   Reason for visit: 4 month follow-up  History of Present Illness:    Megan Brewer is a 76 y.o. female with a hx of chronic chest pain with prior negative perfusion study, valvular heart disease with MR and TR, hypertension, history of lung cancer around 2010 s/p chemo.  She last saw Dr. Percival Spanish October 2022.  With shortness of breath, she referred her to pulmonology.  With weakness and dizziness and hypotension, her amlodipine was discontinued.  She saw her PCP Dr. Sabra Heck in December 2022.  She had edema and her torsemide was doubled.  Today, patient states she gets shortness of breath after eating.  This does not happen every time.  She feels like there is deflated old tire wrapped around her chest and is difficult take a deep breath.  She has associated shakiness and dizzy feeling.  This may last a few minutes to half a day.  She saw pulmonology and she said she checked out "great".  She has appointment with ENT for further evaluation and also for her complaints of raspy and weak voice when she is tired.  Overall her edema has improved.  Her weight has been stable.  She signed up [facility] Silver sneakers at the [facility].  She does cardio and some strength training.  She states this been has been exhausting but she is glad she is doing it.   Past Medical History:  Diagnosis Date   Anemia    Arachnoiditis    Bipolar disorder (HCC)    C. 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[place])    Lung cancer ([place]) 2011, 2014   Memory loss    Migraine    Opioid  abuse (HCC)    Osteoporosis    Presence of neurostimulator    Restless legs    SIRS (systemic inflammatory response syndrome) ([place])    Sleep apnea     Past Surgical History:  Procedure Laterality Date   ABDOMINAL HYSTERECTOMY     CARPAL TUNNEL RELEASE     CATARACT EXTRACTION Bilateral 2019   CHOLECYSTECTOMY     COLONOSCOPY  2015   [facility]   CT LUNG SCREENING  2018   DENTAL SURGERY  06/17/2021   4 teeth rmoved   DG  BONE DENSITY ([place] HX)  2018   DIAGNOSTIC MAMMOGRAM  2019   GASTRIC BYPASS     LUNG CANCER SURGERY  02/2010   MULTIPLE TOOTH EXTRACTIONS     PORT A CATH REVISION Right 04/2021   PORT-A-CATH REMOVAL Left 02/07/2021   Procedure: REMOVAL PORT-A-CATH;  Surgeon: Mickeal Skinner, MD;  Location: [facility];  Service: General;  Laterality: Left;  60 MINUTES ROOM 4   SPINAL CORD STIMULATOR IMPLANT      Current Medications: Current Meds  Medication Sig   azelastine (ASTELIN) 0.1 % nasal spray Place 2 sprays into both nostrils 2 (two) times daily.   baclofen (LIORESAL) 10 MG tablet Take 10 mg by mouth daily as needed for muscle spasms.   buPROPion (WELLBUTRIN XL)  150 MG 24 hr tablet Take 1 tablet (150 mg total) by mouth daily.   Calcium Citrate 200 MG TABS Take 200 mg of elemental calcium by mouth daily.   cholecalciferol (VITAMIN D3) 25 MCG (1000 UNIT) tablet Take 1,000 Units by mouth daily.   cycloSPORINE (RESTASIS) 0.05 % ophthalmic emulsion Place 1 drop into both eyes 2 (two) times daily.   dexlansoprazole (DEXILANT) 60 MG capsule Take 1 capsule (60 mg total) by mouth daily. Crack open the capsule and pour into spoon prior to ingestion.   famotidine (PEPCID) 20 MG tablet One after supper   ferrous sulfate 325 (65 FE) MG tablet Take 650 mg by mouth daily with breakfast.   heparin lock flush 100 UNIT/ML SOLN injection Inject 500 Units into the vein every 21 ( twenty-one) days.   ibuprofen (ADVIL) 200 MG tablet Take 200 mg by mouth as needed.   immune globulin, human,  (GAMMAGARD S/D LESS IGA) 10 g injection Inject 2 g/kg into the vein every 21 ( twenty-one) days.   KRILL OIL PO Take by mouth.   lamoTRIgine (LAMICTAL) 200 MG tablet Take 1 tablet (200 mg total) by mouth at bedtime.   levothyroxine (SYNTHROID) 75 MCG tablet TAKE ONE (1) TABLET BY MOUTH EACH DAY 30MINUTES BEFORE BREAKFAST ON EMPTY STOMACH   magnesium oxide (MAG-OX) 400 MG tablet Take 400 mg by mouth daily.    melatonin 5 MG TABS Take 5 mg by mouth.   mirtazapine (REMERON) 30 MG tablet Take 1 tablet (30 mg total) by mouth at bedtime.   oxybutynin (DITROPAN) 5 MG/5ML syrup TAKE 2.5 MLS BY MOUTH TWICE A DAY   potassium chloride (KLOR-CON) 10 MEQ tablet TAKE ONE (1) TABLET BY MOUTH FOUR (4) TIMES DAILY   rOPINIRole (REQUIP) 1 MG tablet Take 1.5 tablet 2-3 hour prior to bedtime.   sodium chloride, PF, 0.9 % injection Inject 5-10 mLs into the vein as needed (Immunoglobulin).   tamsulosin (FLOMAX) 0.4 MG CAPS capsule Take 1 capsule (0.4 mg total) by mouth daily.   torsemide (DEMADEX) 20 MG tablet Take 1 tablet (20 mg total) by mouth 2 (two) times daily.   triamcinolone cream (KENALOG) 0.1 % Apply 1 application topically 2 (two) times daily as needed (rash).   UNABLE TO FIND Take 1 tablet by mouth daily. Med Name: [name] 1 by mouth daily for UTI prevention   UNABLE TO FIND Hemp Gummies   vitamin B-12 (CYANOCOBALAMIN) 1000 MCG tablet TAKE 1 TABLET BY MOUTH DAILY     Allergies:   Imitrex [sumatriptan], Tetracycline, Corticosteroids, Cefixime, Ciprofloxacin, Doxycycline, Gabapentin, Isometheptene-dichloral-apap, Ketorolac, Prednisone, Promethazine, Stadol [butorphanol], Tape, Erythromycin base, and Propoxyphene   Social History   Socioeconomic History   Marital status: Married    Spouse name: Not on file   Number of children: 2   Years of education: Not on file   Highest education level: GED or equivalent  Occupational History   Occupation: retired  Tobacco Use   Smoking status: Never    Smokeless tobacco: Never  Vaping Use   Vaping Use: Never used  Substance and Sexual Activity   Alcohol use: Not Currently   Drug use: Never   Sexual activity: Not on file  Other Topics Concern   Not on file  Social History Narrative   Diet: None      Caffeine: coffee, tea, sodas less than or 1 daily.      Married, if yes what year: Yes, 1972      Do you live in  a house, apartment, assisted living, condo, trailer, ect: Two story house       Pets: None      Current/Past profession: Teach      Exercise: None         Living Will: No   DNR: No   POA/HPOA: No      Functional Status:   Do you have difficulty bathing or dressing yourself? yes   Do you have difficulty preparing food or eating? yes   Do you have difficulty managing your medications? yes   Do you have difficulty managing your finances?yes   Do you have difficulty affording your medications? No      Right Handed    Lives in a two story home    Social Determinants of Health   Financial Resource Strain: Not on file  Food Insecurity: Not on file  Transportation Needs: Not on file  Physical Activity: Not on file  Stress: Not on file  Social Connections: Not on file     Family History: The patient's family history includes Arthritis in her father and mother; Asthma in her daughter and sister; Bipolar disorder in her brother and mother; Congestive Heart Failure in her father; Dementia in her father and mother; Depression in her father and sister; GER disease in her mother; Hypertension in her mother; Osteoporosis in her mother; Parkinson's disease in her father; Pneumonia in her father. There is no history of Colon cancer, Esophageal cancer, Stomach cancer, or Rectal cancer.  ROS:   Please see the history of present illness.     EKGs/Labs/Other Studies Reviewed:    EKG:  The ekg ordered today demonstrates normal sinus rhythm, left bundle branch block, heart rate 73, PR interval 156 ms, QRS duration 136  ms.  Recent Labs: 01/17/2021: B Natriuretic Peptide 44.8 01/21/2021: Magnesium 1.9 06/23/2021: ALT 14 06/26/2021: Hemoglobin 14.0; Platelets 245.0; Pro B Natriuretic peptide (BNP) 70.0; TSH 4.42 08/14/2021: BUN 31; Creat 1.28; Potassium 3.5; Sodium 138   Recent Lipid Panel Lab Results  Component Value Date/Time   CHOL  01/27/2010 05:50 AM    144        ATP III CLASSIFICATION:  <200     mg/dL   Desirable  200-239  mg/dL   Borderline High  >=240    mg/dL   High          TRIG 68 01/27/2010 05:50 AM   HDL 58 01/27/2010 05:50 AM   LDLCALC  01/27/2010 05:50 AM    72        Total Cholesterol/HDL:CHD Risk Coronary Heart Disease Risk Table                     Men   Women  1/2 Average Risk   3.4   3.3  Average Risk       5.0   4.4  2 X Average Risk   9.6   7.1  3 X Average Risk  23.4   11.0        Use the calculated Patient Ratio above and the CHD Risk Table to determine the patient's CHD Risk.        ATP III CLASSIFICATION (LDL):  <100     mg/dL   Optimal  100-129  mg/dL   Near or Above                    Optimal  130-159  mg/dL   Borderline  160-189  mg/dL   High  >  190     mg/dL   Very High    Physical Exam:    VS:  BP (!) 97/59    Pulse 73    Ht 5' 4.5" (1.638 m)    Wt 140 lb (63.5 kg)    SpO2 98%    BMI 23.66 kg/m    No data found.  Wt Readings from Last 3 Encounters:  10/09/21 140 lb (63.5 kg)  08/06/21 136 lb 6.4 oz (61.9 kg)  07/14/21 133 lb (60.3 kg)     GEN:  Well nourished, well developed in no acute distress HEENT: Normal NECK: No JVD; No carotid bruits CARDIAC: RRR, no murmurs, rubs, gallops RESPIRATORY:  Clear to auscultation without rales, wheezing or rhonchi  ABDOMEN: Soft, non-tender, non-distended MUSCULOSKELETAL: Trace edema; not tight, not pitting, varicose/spider veins SKIN: Warm and dry NEUROLOGIC:  Alert and oriented PSYCHIATRIC:  Normal affect     ASSESSMENT AND PLAN   Shortness of breath, improved -Echo June 2022 with EF 60 to 65%, grade  1 diastolic dysfunction, normal RV, mild TR -Pulmonology work-up unremarkable.  Scheduled to see ENT. -She is increasing her activity as tolerated.  Hypertension, controlled. -Amlodipine discontinued in 06/2021 -Taking torsemide for lower extremity edema  Venous insufficiency Lower extremity edema -Did worse with compression stockings -On torsemide 20 mg twice daily (dose increased in December; minimal AKI and borderline low K in 07/2021). -Recheck BMET.  Disposition - Follow-up as needed with [place].    Medication Adjustments/Labs and Tests Ordered: Current medicines are reviewed at length with the patient today.  Concerns regarding medicines are outlined above.  Orders Placed This Encounter  Procedures   Basic metabolic panel   EKG 26-VZCH   No orders of the defined types were placed in this encounter.   Patient Instructions  Medication Instructions:  No Changes *If you need a refill on your cardiac medications before your next appointment, please call your pharmacy*   Lab Work: BMET If you have labs (blood work) drawn today and your tests are completely normal, you will receive your results only by: [place] (if you have MyChart) OR A paper copy in the mail If you have any lab test that is abnormal or we need to change your treatment, we will call you to review the results.   Testing/Procedures: No Testing   Follow-Up: At [facility], you and your health needs are our priority.  As part of our continuing mission to provide you with exceptional heart care, we have created designated Provider Care Teams.  These Care Teams include your primary Cardiologist (physician) and Advanced Practice Providers (APPs -  Physician Assistants and Nurse Practitioners) who all work together to provide you with the care you need, when you need it.  We recommend signing up for the patient portal called "MyChart".  Sign up information is provided on this After Visit Summary.   MyChart is used to connect with patients for Virtual Visits (Telemedicine).  Patients are able to view lab/test results, encounter notes, upcoming appointments, etc.  Non-urgent messages can be sent to your provider as well.   To learn more about what you can do with MyChart, go to [email or web address].    Your next appointment:   As Needed  The format for your next appointment:   In Person  Provider:   Minus Breeding, MD        Signed, Warren Lacy, PA-C  10/09/2021 12:18 PM    [place]

## 2021-10-09 ENCOUNTER — Ambulatory Visit: Payer: Medicare PPO | Admitting: Physician Assistant

## 2021-10-09 ENCOUNTER — Other Ambulatory Visit: Payer: Self-pay

## 2021-10-09 ENCOUNTER — Encounter: Payer: Self-pay | Admitting: Physician Assistant

## 2021-10-09 VITALS — BP 97/59 | HR 73 | Ht 64.5 in | Wt 140.0 lb

## 2021-10-09 DIAGNOSIS — I872 Venous insufficiency (chronic) (peripheral): Secondary | ICD-10-CM | POA: Diagnosis not present

## 2021-10-09 DIAGNOSIS — E785 Hyperlipidemia, unspecified: Secondary | ICD-10-CM

## 2021-10-09 DIAGNOSIS — R0602 Shortness of breath: Secondary | ICD-10-CM

## 2021-10-09 DIAGNOSIS — I10 Essential (primary) hypertension: Secondary | ICD-10-CM | POA: Diagnosis not present

## 2021-10-09 NOTE — Patient Instructions (Signed)
Medication Instructions:  No Changes *If you need a refill on your cardiac medications before your next appointment, please call your pharmacy*   Lab Work: BMET If you have labs (blood work) drawn today and your tests are completely normal, you will receive your results only by: [place] (if you have MyChart) OR A paper copy in the mail If you have any lab test that is abnormal or we need to change your treatment, we will call you to review the results.   Testing/Procedures: No Testing   Follow-Up: At [facility], you and your health needs are our priority.  As part of our continuing mission to provide you with exceptional heart care, we have created designated Provider Care Teams.  These Care Teams include your primary Cardiologist (physician) and Advanced Practice Providers (APPs -  Physician Assistants and Nurse Practitioners) who all work together to provide you with the care you need, when you need it.  We recommend signing up for the patient portal called "MyChart".  Sign up information is provided on this After Visit Summary.  MyChart is used to connect with patients for Virtual Visits (Telemedicine).  Patients are able to view lab/test results, encounter notes, upcoming appointments, etc.  Non-urgent messages can be sent to your provider as well.   To learn more about what you can do with MyChart, go to [email or web address].    Your next appointment:   As Needed  The format for your next appointment:   In Person  Provider:   Minus Breeding, MD

## 2021-10-10 ENCOUNTER — Encounter: Payer: Self-pay | Admitting: Psychiatry

## 2021-10-10 ENCOUNTER — Ambulatory Visit (INDEPENDENT_AMBULATORY_CARE_PROVIDER_SITE_OTHER): Payer: Medicare PPO | Admitting: Psychiatry

## 2021-10-10 DIAGNOSIS — F3162 Bipolar disorder, current episode mixed, moderate: Secondary | ICD-10-CM | POA: Diagnosis not present

## 2021-10-10 LAB — BASIC METABOLIC PANEL
BUN/Creatinine Ratio: 20 (ref 12–28)
BUN: 29 mg/dL — ABNORMAL HIGH (ref 8–27)
CO2: 23 mmol/L (ref 20–29)
Calcium: 9.8 mg/dL (ref 8.7–10.3)
Chloride: 102 mmol/L (ref 96–106)
Creatinine, Ser: 1.48 mg/dL — ABNORMAL HIGH (ref 0.57–1.00)
Glucose: 99 mg/dL (ref 70–99)
Potassium: 3.9 mmol/L (ref 3.5–5.2)
Sodium: 142 mmol/L (ref 134–144)
eGFR: 37 mL/min/{1.73_m2} — ABNORMAL LOW (ref >59)

## 2021-10-10 MED ORDER — LAMOTRIGINE 200 MG PO TABS: 200.0000 mg | ORAL_TABLET | Freq: Every day | ORAL | 0 refills | Status: DC

## 2021-10-10 MED ORDER — BUPROPION HCL ER (XL) 150 MG PO TB24: 150.0000 mg | ORAL_TABLET | Freq: Every day | ORAL | 1 refills | Status: DC

## 2021-10-10 NOTE — Progress Notes (Signed)
Megan Brewer [id number] 09-May-1946 76 y.o.  Virtual Visit via Telephone Note  I connected with pt on 10/10/21 at 11:00 AM EST by telephone and verified that I am speaking with the correct person using two identifiers.  I discussed the limitations, risks, security and privacy concerns of performing an evaluation and management service by telephone and the availability of in person appointments. I also discussed with the patient that there may be a patient responsible charge related to this service. The patient expressed understanding and agreed to proceed.   I discussed the assessment and treatment plan with the patient. The patient was provided an opportunity to ask questions and all were answered. The patient agreed with the plan and demonstrated an understanding of the instructions.   The patient was advised to call back or seek an in-person evaluation if the symptoms worsen or if the condition fails to improve as anticipated.  I provided 25 minutes of non-face-to-face time during this encounter.  The patient was located at home.  The provider was located at [place].   Thayer Headings, PMHNP   Subjective:   Patient ID:  Megan Brewer is a 76 y.o. (DOB 1945-09-28) female.  Chief Complaint:  Chief Complaint  Patient presents with   Follow-up    Depression, anxiety, insomnia    HPI Megan Brewer presents for follow-up of mood disturbance, anxiety, and insomnia.   She reports that her mother is in a nursing home. She reports that "she was never a pleasant person" and this has worsened in recent years. She notices that her back will tense up a day before going to visit her mother and will continue to cause pain for a few days afterwards. She reports that she has difficulty walking by the time she leaves her mother's. She asks if she could use Baclofen. She reports "dread, anxiety, frustration, and anger" before visiting mother. She reports that her brother  has not visited family in 25 years. Sister that lives in [place] visits mother every 2 weeks.   She reports that her anxiety is "ok for the most part outside of visits with mother."   She reports that Bupropion initially seemed to be very effective and "then it leveled off." Denies any worsening anxiety or irritability. She reports that sad mood is "under control. It's not gone, but it's manageable." Energy has been "so, so." Motivation is fair. She reports that she and her husband have signed up for [place]. She reports that she continues to have difficulty sleeping. She reports that she has a chronic cough at night that disrupts her sleep. She reports that last night she was awakening every 30-60 minutes with coughing. She reports that this is a "major reason" she is not sleeping well. Appetite has been "too good." She reports concentration is ok except when preparing for visits with her mother. She reports some slight improvement in enjoyment things. Denies crying. She reports that she "seems to enjoy a happy occurrence... and feel sad at sad times." Denies SI.   Denies any change in spending. She denies any elevated moods.   Past Psychiatric Medication Trials: Sertraline- Was effective for about 25 years and no longer seems to be as effective. Prozac Paxil Cymbalta- may have caused psychosis. Remeron-Helpful for her mood and caused her legs to give out.  Depakote Trileptal- shaking and night sweats Gabapentin- Had throat swelling Lithium- Edema. Minimally effective Seroquel Risperidone Zyprexa- Effective for insomnia, anxiety, and mood. Excessive wt gain.  Saphris  Lamotrigine Melatonin Ativan- Reports taking until 2 months ago. Valium- "great for my depression." Xanax Trazodone- Ineffective Requip- Took for years at 0.5 mg po qd. Ingrezza- multiple side effects    Review of Systems:  Review of Systems  Respiratory:  Positive for cough.   Musculoskeletal:  Positive for  back pain. Negative for gait problem.  Neurological:        She reports that tremor has improved compared to the past.   Psychiatric/Behavioral:         Please refer to HPI   Medications: I have reviewed the patient's current medications.  Current Outpatient Medications  Medication Sig Dispense Refill   torsemide (DEMADEX) 20 MG tablet Take 1 tablet (20 mg total) by mouth 2 (two) times daily. 180 tablet 2   acetaminophen (TYLENOL) 500 MG tablet Take 500 mg by mouth every 6 (six) hours as needed for moderate pain. (Patient not taking: Reported on 10/09/2021)     azelastine (ASTELIN) 0.1 % nasal spray Place 2 sprays into both nostrils 2 (two) times daily. 30 mL 5   baclofen (LIORESAL) 10 MG tablet Take 10 mg by mouth daily as needed for muscle spasms.     buPROPion (WELLBUTRIN XL) 150 MG 24 hr tablet Take 1 tablet (150 mg total) by mouth daily. 90 tablet 1   Calcium Citrate 200 MG TABS Take 200 mg of elemental calcium by mouth daily.     carbidopa-levodopa (SINEMET IR) 25-100 MG tablet Take as needed for severe restless leg syndrome. (Patient not taking: Reported on 10/09/2021) 30 tablet 2   cholecalciferol (VITAMIN D3) 25 MCG (1000 UNIT) tablet Take 1,000 Units by mouth daily.     cycloSPORINE (RESTASIS) 0.05 % ophthalmic emulsion Place 1 drop into both eyes 2 (two) times daily.     dexlansoprazole (DEXILANT) 60 MG capsule Take 1 capsule (60 mg total) by mouth daily. Crack open the capsule and pour into spoon prior to ingestion. 30 capsule 11   famotidine (PEPCID) 20 MG tablet One after supper 30 tablet 11   ferrous sulfate 325 (65 FE) MG tablet Take 650 mg by mouth daily with breakfast.     heparin lock flush 100 UNIT/ML SOLN injection Inject 500 Units into the vein every 21 ( twenty-one) days.     ibuprofen (ADVIL) 200 MG tablet Take 200 mg by mouth as needed.     immune globulin, human, (GAMMAGARD S/D LESS IGA) 10 g injection Inject 2 g/kg into the vein every 21 ( twenty-one) days.     KRILL  OIL PO Take by mouth.     lamoTRIgine (LAMICTAL) 200 MG tablet Take 1 tablet (200 mg total) by mouth at bedtime. 90 tablet 0   levothyroxine (SYNTHROID) 75 MCG tablet TAKE ONE (1) TABLET BY MOUTH EACH DAY 30MINUTES BEFORE BREAKFAST ON EMPTY STOMACH 90 tablet 1   loperamide (IMODIUM A-D) 2 MG tablet Take 2 mg by mouth 4 (four) times daily as needed for diarrhea or loose stools. (Patient not taking: Reported on 10/09/2021)     magnesium oxide (MAG-OX) 400 MG tablet Take 400 mg by mouth daily.      melatonin 5 MG TABS Take 5 mg by mouth.     mirtazapine (REMERON) 30 MG tablet Take 1 tablet (30 mg total) by mouth at bedtime. 90 tablet 1   oxybutynin (DITROPAN) 5 MG/5ML syrup TAKE 2.5 MLS BY MOUTH TWICE A DAY 473 mL 1   potassium chloride (KLOR-CON) 10 MEQ tablet TAKE ONE (1) TABLET BY  MOUTH FOUR (4) TIMES DAILY 120 tablet 5   rOPINIRole (REQUIP) 1 MG tablet Take 1.5 tablet 2-3 hour prior to bedtime. 135 tablet 3   sodium chloride, PF, 0.9 % injection Inject 5-10 mLs into the vein as needed (Immunoglobulin).     sucralfate (CARAFATE) 1 GM/10ML suspension TAKE 2 TEASPOONSFUL BY MOUTH THREE TIME DAILY AS NEEDED 420 mL 1   tamsulosin (FLOMAX) 0.4 MG CAPS capsule Take 1 capsule (0.4 mg total) by mouth daily. 30 capsule 1   triamcinolone cream (KENALOG) 0.1 % Apply 1 application topically 2 (two) times daily as needed (rash).     UNABLE TO FIND Take 1 tablet by mouth daily. Med Name: [name] 1 by mouth daily for UTI prevention     UNABLE TO FIND Hemp Gummies     vitamin B-12 (CYANOCOBALAMIN) 1000 MCG tablet TAKE 1 TABLET BY MOUTH DAILY     ZINC OXIDE, TOPICAL, 10 % CREA Apply 1 application topically 2 (two) times daily as needed (rash). (Patient not taking: Reported on 10/09/2021)     No current facility-administered medications for this visit.    Medication Side Effects: None  Allergies:  Allergies  Allergen Reactions   Imitrex [Sumatriptan] Nausea And Vomiting    Ringing in the ears, migraines are  worse   Tetracycline Nausea Only    Causes ringing in ears, dizziness, migraine, nausea   Corticosteroids Other (See Comments)    12/02/2016 interview by ZOX: Oral dosing causes migraines/nausea/tinnitus. Prev tolerated IV and intranasal admin w/o difficulty.   Cefixime Other (See Comments)    Headache    Ciprofloxacin Other (See Comments)    Headache, dizziness, ringing in the ears   Doxycycline Other (See Comments)    unknown   Gabapentin     Throat swells/closes   Isometheptene-Dichloral-Apap Other (See Comments)    unknown   Ketorolac     12/02/2016 interview by WRU: Oral dosing prednisone/corticosteroids causes migraines/nausea/tinnitus. Prev tolerated IV and intranasal admin w/o difficulty.   Prednisone Other (See Comments)    Migraine, dizzy, ringing in ears-can take it IV 12/02/2016 interview by [name]: Oral prednisone dosing causes migraines/nausea/tinnitus. Prev tolerated IV and intranasal admin w/o difficulty.   Promethazine Other (See Comments)    causes severe abdominal pain when taken IV; however she can take PO or IM   Stadol [Butorphanol]     Cerebral pain   Tape Other (See Comments)    Adhesive tapes-patient denies   Erythromycin Base Diarrhea and Itching    Severe diarrhea   Propoxyphene Nausea Only    Past Medical History:  Diagnosis Date   Anemia    Arachnoiditis    Bipolar disorder (HCC)    C. 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[place])    Lung cancer ([place]) 2011, 2014   Memory loss    Migraine    Opioid abuse ([place])    Osteoporosis    Presence of neurostimulator    Restless legs    SIRS (systemic inflammatory response syndrome) (HCC)    Sleep apnea     Family History  Problem Relation Age of Onset    Hypertension Mother    GER disease Mother    Dementia Mother  Osteoporosis Mother    Arthritis Mother    Bipolar disorder Mother    Parkinson's disease Father    Congestive Heart Failure Father    Pneumonia Father    Arthritis Father    Dementia Father    Depression Father    Asthma Sister    Depression Sister    Bipolar disorder Brother    Asthma Daughter    Colon cancer Neg Hx    Esophageal cancer Neg Hx    Stomach cancer Neg Hx    Rectal cancer Neg Hx     Social History   Socioeconomic History   Marital status: Married    Spouse name: Not on file   Number of children: 2   Years of education: Not on file   Highest education level: GED or equivalent  Occupational History   Occupation: retired  Tobacco Use   Smoking status: Never   Smokeless tobacco: Never  Vaping Use   Vaping Use: Never used  Substance and Sexual Activity   Alcohol use: Not Currently   Drug use: Never   Sexual activity: Not on file  Other Topics Concern   Not on file  Social History Narrative   Diet: None      Caffeine: coffee, tea, sodas less than or 1 daily.      Married, if yes what year: Yes, 1972      Do you live in a house, apartment, assisted living, condo, trailer, ect: Two story house       Pets: None      Current/Past profession: Teach      Exercise: None         Living Will: No   DNR: No   POA/HPOA: No      Functional Status:   Do you have difficulty bathing or dressing yourself? yes   Do you have difficulty preparing food or eating? yes   Do you have difficulty managing your medications? yes   Do you have difficulty managing your finances?yes   Do you have difficulty affording your medications? No      Right Handed    Lives in a two story home    Social Determinants of Health   Financial Resource Strain: Not on file  Food Insecurity: Not on file  Transportation Needs: Not on file  Physical Activity: Not on file  Stress: Not on file  Social Connections:  Not on file  Intimate Partner Violence: Not on file    Past Medical History, Surgical history, Social history, and Family history were reviewed and updated as appropriate.   Please see review of systems for further details on the patient's review from today.   Objective:   Physical Exam:  There were no vitals taken for this visit.  Physical Exam Neurological:     Mental Status: She is alert and oriented to person, place, and time.     Cranial Nerves: No dysarthria.  Psychiatric:        Attention and Perception: Attention and perception normal.        Speech: Speech normal.        Behavior: Behavior is cooperative.        Thought Content: Thought content normal. Thought content is not paranoid or delusional. Thought content does not include homicidal or suicidal ideation. Thought content does not include homicidal or suicidal plan.        Cognition and Memory: Cognition and memory normal.        Judgment: Judgment normal.  Comments: Insight intact Mood is anxious when discussing visits with mother Mood is less depressed compared to last exam    Lab Review:     Component Value Date/Time   NA 142 10/09/2021 1214   K 3.9 10/09/2021 1214   CL 102 10/09/2021 1214   CO2 23 10/09/2021 1214   GLUCOSE 99 10/09/2021 1214   GLUCOSE 91 08/14/2021 1059   BUN 29 (H) 10/09/2021 1214   CREATININE 1.48 (H) 10/09/2021 1214   CREATININE 1.28 (H) 08/14/2021 1059   CALCIUM 9.8 10/09/2021 1214   PROT 7.1 06/23/2021 1056   ALBUMIN 3.7 06/23/2021 1056   AST 20 06/23/2021 1056   ALT 14 06/23/2021 1056   ALKPHOS 73 06/23/2021 1056   BILITOT 0.3 06/23/2021 1056   GFRNONAA >60 06/23/2021 1056   GFRNONAA 33 (L) 06/10/2020 1549   GFRAA 38 (L) 06/10/2020 1549       Component Value Date/Time   WBC 7.6 06/26/2021 1607   RBC 4.74 06/26/2021 1607   HGB 14.0 06/26/2021 1607   HCT 42.0 06/26/2021 1607   PLT 245.0 06/26/2021 1607   MCV 88.7 06/26/2021 1607   MCH 29.7 06/23/2021 1056   MCHC  33.3 06/26/2021 1607   RDW 13.2 06/26/2021 1607   LYMPHSABS 1.7 06/26/2021 1607   MONOABS 0.6 06/26/2021 1607   EOSABS 0.2 06/26/2021 1607   BASOSABS 0.1 06/26/2021 1607    No results found for: POCLITH, LITHIUM   No results found for: PHENYTOIN, PHENOBARB, VALPROATE, CBMZ   .res Assessment: Plan:    Patient seen for 25 minutes and time spent discussing response to Wellbutrin.  Patient reports that her mood has been less depressed and she has had less depressive signs and symptoms.  She reports that she would like to continue Wellbutrin XL 150 mg daily for depression.  We will continue Wellbutrin XL 150 mg daily for depression. She notices increased muscle tension and back pain in response to stress of going to visit mother.  She asks about using baclofen as needed to relieve these signs and symptoms.  Discussed that her medication list indicates that baclofen has been ordered daily as needed for muscle spasms and that she may want to try taking baclofen when she experiences increased muscle tension on those days (ie. the day prior when anticipatory anxiety starts, on the day of, and the day after if needed).  Continue lamotrigine 200 mg at bedtime for mood signs and symptoms. Continue mirtazapine 30 mg at bedtime for mood, anxiety, and insomnia. Pt to follow-up in 3 months or sooner if clinically indicated.  Patient advised to contact office with any questions, adverse effects, or acute worsening in signs and symptoms.    Megan Brewer was seen today for follow-up.  Diagnoses and all orders for this visit:  Bipolar disorder, current episode mixed, moderate (HCC) -     buPROPion (WELLBUTRIN XL) 150 MG 24 hr tablet; Take 1 tablet (150 mg total) by mouth daily. -     lamoTRIgine (LAMICTAL) 200 MG tablet; Take 1 tablet (200 mg total) by mouth at bedtime.    Please see After Visit Summary for patient specific instructions.  Future Appointments  Date Time Provider [place] [facility]   10/24/2021 11:15 AM [facility] NURSE [facility] None  11/14/2021  3:30 PM Narda Amber K, DO [facility] None  12/29/2021  3:15 PM [name], NP [facility] None    No orders of the defined types were placed in this encounter.     -------------------------------

## 2021-10-15 ENCOUNTER — Telehealth: Payer: Self-pay

## 2021-10-15 ENCOUNTER — Other Ambulatory Visit: Payer: Self-pay

## 2021-10-15 DIAGNOSIS — M79605 Pain in left leg: Secondary | ICD-10-CM

## 2021-10-15 DIAGNOSIS — M79604 Pain in right leg: Secondary | ICD-10-CM

## 2021-10-15 NOTE — Telephone Encounter (Signed)
-----   Message from Warren Lacy, PA-C sent at 10/14/2021  1:36 PM EST ----- ?Kidney function is worse.  I think you are on too much diuretic.   ?Recommend decreasing torsemide to 20 mg 1 tablet a day.  And rechecking an BMET in 2 weeks. ? ? ?

## 2021-10-21 DIAGNOSIS — J3489 Other specified disorders of nose and nasal sinuses: Secondary | ICD-10-CM | POA: Diagnosis not present

## 2021-10-21 DIAGNOSIS — J384 Edema of larynx: Secondary | ICD-10-CM | POA: Diagnosis not present

## 2021-10-21 DIAGNOSIS — R059 Cough, unspecified: Secondary | ICD-10-CM | POA: Diagnosis not present

## 2021-10-21 DIAGNOSIS — J385 Laryngeal spasm: Secondary | ICD-10-CM | POA: Diagnosis not present

## 2021-10-21 DIAGNOSIS — L539 Erythematous condition, unspecified: Secondary | ICD-10-CM | POA: Diagnosis not present

## 2021-10-21 DIAGNOSIS — J387 Other diseases of larynx: Secondary | ICD-10-CM | POA: Diagnosis not present

## 2021-10-21 DIAGNOSIS — J383 Other diseases of vocal cords: Secondary | ICD-10-CM | POA: Diagnosis not present

## 2021-10-21 DIAGNOSIS — R490 Dysphonia: Secondary | ICD-10-CM | POA: Diagnosis not present

## 2021-10-23 DIAGNOSIS — H04123 Dry eye syndrome of bilateral lacrimal glands: Secondary | ICD-10-CM | POA: Diagnosis not present

## 2021-10-23 DIAGNOSIS — H26493 Other secondary cataract, bilateral: Secondary | ICD-10-CM | POA: Diagnosis not present

## 2021-10-24 ENCOUNTER — Other Ambulatory Visit: Payer: Self-pay

## 2021-10-24 ENCOUNTER — Ambulatory Visit (INDEPENDENT_AMBULATORY_CARE_PROVIDER_SITE_OTHER): Payer: Medicare PPO

## 2021-10-24 DIAGNOSIS — M810 Age-related osteoporosis without current pathological fracture: Secondary | ICD-10-CM

## 2021-10-24 MED ORDER — DENOSUMAB 60 MG/ML ~~LOC~~ SOSY
60.0000 mg | PREFILLED_SYRINGE | Freq: Once | SUBCUTANEOUS | Status: AC
  Administered 2021-10-24: 60 mg via SUBCUTANEOUS

## 2021-10-29 ENCOUNTER — Telehealth: Payer: Self-pay | Admitting: Psychiatry

## 2021-10-29 ENCOUNTER — Other Ambulatory Visit: Payer: Self-pay | Admitting: Psychiatry

## 2021-10-29 DIAGNOSIS — F3162 Bipolar disorder, current episode mixed, moderate: Secondary | ICD-10-CM

## 2021-10-29 NOTE — Telephone Encounter (Signed)
Ms Jayli lvm at 1:15 stating medication problem, please call. # [phone]. ?

## 2021-10-29 NOTE — Telephone Encounter (Signed)
Called patient and she said she had resolved the issue.  ?

## 2021-10-30 DIAGNOSIS — M79605 Pain in left leg: Secondary | ICD-10-CM | POA: Diagnosis not present

## 2021-10-30 DIAGNOSIS — M79604 Pain in right leg: Secondary | ICD-10-CM | POA: Diagnosis not present

## 2021-11-10 ENCOUNTER — Telehealth: Payer: Self-pay | Admitting: Cardiology

## 2021-11-10 DIAGNOSIS — E875 Hyperkalemia: Secondary | ICD-10-CM

## 2021-11-10 NOTE — Telephone Encounter (Signed)
Patient states on 3/16 she had labs done, but has not received any lab results. I did not see that there are any final results from 3/16 for labs. She says she went to the [address] location for the lab work. She says it was difficult to find the lab, because it was an unmarked room and that the forms from the office for the labs were "extremely lacking in information". She says they went through her blood before noon. Please advise. ?

## 2021-11-10 NOTE — Telephone Encounter (Signed)
Pt called asking about her lab results from the labs drawn 10/30/21 at the [facility] at the [facility] location on [address].. I advised her that I will call to have the results faxed to [place].. ? ?[facility] # [phone] lab result requested through an automated service.. to be faxed to [phone].  ? ?Will forward to her nurse for further review.  ? ? ?

## 2021-11-11 ENCOUNTER — Other Ambulatory Visit: Payer: Self-pay | Admitting: Physician Assistant

## 2021-11-11 DIAGNOSIS — M79604 Pain in right leg: Secondary | ICD-10-CM

## 2021-11-12 NOTE — Telephone Encounter (Signed)
Labs received and reviewed by dr hochrein, due to potassium level of 5.3, the patient was instructed to stop potassium and to get lab work again in 2 weeks. Patient aware but states the last time she held her k+ her level got so low she was admitted to the hospital because it got too low. She will check labs again in 1 week. Lab orders mailed to the pt  ?

## 2021-11-14 ENCOUNTER — Ambulatory Visit: Payer: Medicare PPO | Admitting: Neurology

## 2021-11-14 ENCOUNTER — Encounter: Payer: Self-pay | Admitting: Neurology

## 2021-11-14 VITALS — BP 114/66 | HR 75 | Ht 64.5 in | Wt 143.0 lb

## 2021-11-14 DIAGNOSIS — M25552 Pain in left hip: Secondary | ICD-10-CM

## 2021-11-14 DIAGNOSIS — G2581 Restless legs syndrome: Secondary | ICD-10-CM

## 2021-11-14 MED ORDER — ROPINIROLE HCL 1 MG PO TABS: ORAL_TABLET | ORAL | 3 refills | Status: DC

## 2021-11-14 NOTE — Progress Notes (Signed)
? ? ?Follow-up Visit ? ? ?Date: 11/14/21 ? ? ?Megan Brewer ?MRN: [id number] ?DOB: Apr 14, 1946 ? ? ?Interim History: ?Megan Brewer is a 76 y.o. right handed female with lung cancer, chronic back pain, arachnoiditis s/p spinal cord stimulator (nonfunctioning), depression, GERD, CKD, immunoglobulin deficiency, and hypothyroidism  returning to the clinic for follow-up of restless leg syndrome and new complaints of left hip pain.  The patient was accompanied to the clinic by self. ? ?History of present illness: ?She reports having restless leg syndrome for about 20 years and has been on ropinirole 1mg  at bedtime which controls her symptoms, however, when she transferred care locally, she was instructed to come off her ropinirole and reduced it to 0.5mg  at bedtime.  Her RLS particularly became worse in February. Because this dose did not control her discomfort, she self-titrated ropinirole to 1mg  at bedtime with tablets that she had from prior refill.  When she takes ropinirole 1mg  at bedtime, she sleeps well and RLS does not bother her.  Of note, she was found to have severe iron deficiency anemia (ferritin 4.9) and is on iron supplements. ?  ?UPDATE 03/06/2021:  She is here for follow-up, last seen in March 2021 at which time she was continued on ropinirole 1mg  at bedtime and ferritin was found to be very low.  She has been on iron and her last ferritin is up to 42.  She has several hospitalizations this year, most recently for c. difficile and sepsis.  She tells me that her RLS is well-controlled on ropinirole 1mg  at bedtime about 3-4 nights of the week, but the remaining night, she may wake up within an hour having to take another 1mg , and rarely will take up to 3mg .   ? ?She also complains of generalized tremors of the arms which has been long standing.  Her father had Parksinon's and she is concerned about this.  Her tremors are worse with action, such as when doing tasks.  She denies resting tremor. She  denies stiffness.  She has some imbalance, but walks unassisted and has not suffered any falls.  ? ?UPDATE 07/14/2021: Starting in August, she has noticed that RLS can start earlier in the day around 4p.  She has tried sinemet 4-5 times which has helped. Most of the time ropinirole 1.5mg  controls her RLS. Her tremors are mild unchanged.  Her balance and gait has been more unsteady. She has history of arachnoiditis and low back pain.  She was seeing pain management and has spinal cord stimulator which is nonfunctional now. ? ?UPDATE 11/14/2021:  She is here for follow-up. She has noticed that RLS has started to creep into the daytime more often, starting around 2-3p.  Nighttime symptoms are well-controlled on ropinirole 1.5mg  at bedtime.  She takes sinemet about twice per month for severe RLS, which helps.  ? ?For the past 2 months, she has left hip pain that radiates towards her knee.  It is worse with walking.  Pain is sharp and improved with NSAIDs and rest.  She denies numbness/tingling of the leg.  ? ?No change in tremors. She did not start PT for gait, but is trying her own home exercises.  ? ?Medications:  ?Current Outpatient Medications on File Prior to Visit  ?Medication Sig Dispense Refill  ? acetaminophen (TYLENOL) 500 MG tablet Take 500 mg by mouth every 6 (six) hours as needed for moderate pain.    ? azelastine (ASTELIN) 0.1 % nasal spray Place 2 sprays into both  nostrils 2 (two) times daily. 30 mL 5  ? baclofen (LIORESAL) 10 MG tablet Take 10 mg by mouth daily as needed for muscle spasms.    ? buPROPion (WELLBUTRIN XL) 150 MG 24 hr tablet Take 1 tablet (150 mg total) by mouth daily. 90 tablet 1  ? Calcium Citrate 200 MG TABS Take 200 mg of elemental calcium by mouth daily.    ? carbidopa-levodopa (SINEMET IR) 25-100 MG tablet Take as needed for severe restless leg syndrome. 30 tablet 2  ? cholecalciferol (VITAMIN D3) 25 MCG (1000 UNIT) tablet Take 1,000 Units by mouth daily.    ? cycloSPORINE (RESTASIS)  0.05 % ophthalmic emulsion Place 1 drop into both eyes 2 (two) times daily.    ? dexlansoprazole (DEXILANT) 60 MG capsule Take 1 capsule (60 mg total) by mouth daily. Crack open the capsule and pour into spoon prior to ingestion. 30 capsule 11  ? famotidine (PEPCID) 20 MG tablet One after supper 30 tablet 11  ? ferrous sulfate 325 (65 FE) MG tablet Take 650 mg by mouth daily with breakfast.    ? heparin lock flush 100 UNIT/ML SOLN injection Inject 500 Units into the vein every 21 ( twenty-one) days.    ? ibuprofen (ADVIL) 200 MG tablet Take 200 mg by mouth as needed.    ? immune globulin, human, (GAMMAGARD S/D LESS IGA) 10 g injection Inject 2 g/kg into the vein every 21 ( twenty-one) days.    ? KRILL OIL PO Take by mouth.    ? lamoTRIgine (LAMICTAL) 200 MG tablet Take 1 tablet (200 mg total) by mouth at bedtime. 90 tablet 0  ? levothyroxine (SYNTHROID) 75 MCG tablet TAKE ONE (1) TABLET BY MOUTH EACH DAY 30MINUTES BEFORE BREAKFAST ON EMPTY STOMACH 90 tablet 1  ? loperamide (IMODIUM A-D) 2 MG tablet Take 2 mg by mouth 4 (four) times daily as needed for diarrhea or loose stools.    ? magnesium oxide (MAG-OX) 400 MG tablet Take 400 mg by mouth daily.     ? melatonin 5 MG TABS Take 5 mg by mouth.    ? mirtazapine (REMERON) 30 MG tablet Take 1 tablet (30 mg total) by mouth at bedtime. 90 tablet 1  ? oxybutynin (DITROPAN) 5 MG/5ML syrup TAKE 2.5 MLS BY MOUTH TWICE A DAY 473 mL 1  ? rOPINIRole (REQUIP) 1 MG tablet Take 1.5 tablet 2-3 hour prior to bedtime. 135 tablet 3  ? sodium chloride, PF, 0.9 % injection Inject 5-10 mLs into the vein as needed (Immunoglobulin).    ? sucralfate (CARAFATE) 1 GM/10ML suspension TAKE 2 TEASPOONSFUL BY MOUTH THREE TIME DAILY AS NEEDED 420 mL 1  ? tamsulosin (FLOMAX) 0.4 MG CAPS capsule Take 1 capsule (0.4 mg total) by mouth daily. 30 capsule 1  ? torsemide (DEMADEX) 20 MG tablet Take 1 tablet (20 mg total) by mouth 2 (two) times daily. (Patient taking differently: Take 20 mg by mouth once.  Take 1 Tablet Daily.) 180 tablet 2  ? triamcinolone cream (KENALOG) 0.1 % Apply 1 application topically 2 (two) times daily as needed (rash).    ? UNABLE TO FIND Take 1 tablet by mouth daily. Med Name: [name] 1 by mouth daily for UTI prevention    ? UNABLE TO FIND Hemp Gummies    ? vitamin B-12 (CYANOCOBALAMIN) 1000 MCG tablet TAKE 1 TABLET BY MOUTH DAILY    ? ZINC OXIDE, TOPICAL, 10 % CREA Apply 1 application topically 2 (two) times daily as needed (rash).    ? ?  No current facility-administered medications on file prior to visit.  ? ? ?Allergies:  ?Allergies  ?Allergen Reactions  ? Imitrex [Sumatriptan] Nausea And Vomiting  ?  Ringing in the ears, migraines are worse  ? Tetracycline Nausea Only  ?  Causes ringing in ears, dizziness, migraine, nausea  ? Corticosteroids Other (See Comments)  ?  12/02/2016 interview by Melissa Montane: Oral dosing causes migraines/nausea/tinnitus. Prev tolerated IV and intranasal admin w/o difficulty.  ? Cefixime Other (See Comments)  ?  Headache ?  ? Ciprofloxacin Other (See Comments)  ?  Headache, dizziness, ringing in the ears  ? Doxycycline Other (See Comments)  ?  unknown  ? Gabapentin   ?  Throat swells/closes  ? Isometheptene-Dichloral-Apap Other (See Comments)  ?  unknown  ? Ketorolac   ?  12/02/2016 interview by Melissa Montane: Oral dosing prednisone/corticosteroids causes migraines/nausea/tinnitus. Prev tolerated IV and intranasal admin w/o difficulty.  ? Prednisone Other (See Comments)  ?  Migraine, dizzy, ringing in ears-can take it IV ?12/02/2016 interview by [name]: Oral prednisone dosing causes migraines/nausea/tinnitus. Prev tolerated IV and intranasal admin w/o difficulty.  ? Promethazine Other (See Comments)  ?  causes severe abdominal pain when taken IV; however she can take PO or IM  ? Stadol [Butorphanol]   ?  Cerebral pain  ? Tape Other (See Comments)  ?  Adhesive tapes-patient denies  ? Erythromycin Base Diarrhea and Itching  ?  Severe diarrhea  ? Propoxyphene Nausea Only  ? ? ?Vital  Signs:  ?BP 114/66   Pulse 75   Ht 5' 4.5" (1.638 m)   Wt 143 lb (64.9 kg)   SpO2 99%   BMI 24.17 kg/m?  ? ?Neurological Exam: ?MENTAL STATUS including orientation to time, place, person, recent and remot

## 2021-11-18 DIAGNOSIS — J385 Laryngeal spasm: Secondary | ICD-10-CM | POA: Diagnosis not present

## 2021-11-18 DIAGNOSIS — J383 Other diseases of vocal cords: Secondary | ICD-10-CM | POA: Diagnosis not present

## 2021-11-18 DIAGNOSIS — R490 Dysphonia: Secondary | ICD-10-CM | POA: Diagnosis not present

## 2021-11-18 DIAGNOSIS — R053 Chronic cough: Secondary | ICD-10-CM | POA: Diagnosis not present

## 2021-11-20 DIAGNOSIS — E875 Hyperkalemia: Secondary | ICD-10-CM | POA: Diagnosis not present

## 2021-11-21 LAB — BASIC METABOLIC PANEL
BUN/Creatinine Ratio: 18 (ref 12–28)
BUN: 17 mg/dL (ref 8–27)
CO2: 18 mmol/L — ABNORMAL LOW (ref 20–29)
Calcium: 8.2 mg/dL — ABNORMAL LOW (ref 8.7–10.3)
Chloride: 107 mmol/L — ABNORMAL HIGH (ref 96–106)
Creatinine, Ser: 0.97 mg/dL (ref 0.57–1.00)
Glucose: 88 mg/dL (ref 70–99)
Potassium: 4.2 mmol/L (ref 3.5–5.2)
Sodium: 140 mmol/L (ref 134–144)
eGFR: 62 mL/min/{1.73_m2} (ref >59)

## 2021-11-24 ENCOUNTER — Encounter: Payer: Self-pay | Admitting: *Deleted

## 2021-11-26 DIAGNOSIS — R053 Chronic cough: Secondary | ICD-10-CM | POA: Diagnosis not present

## 2021-11-26 DIAGNOSIS — J385 Laryngeal spasm: Secondary | ICD-10-CM | POA: Diagnosis not present

## 2021-11-26 DIAGNOSIS — J383 Other diseases of vocal cords: Secondary | ICD-10-CM | POA: Diagnosis not present

## 2021-11-26 DIAGNOSIS — R490 Dysphonia: Secondary | ICD-10-CM | POA: Diagnosis not present

## 2021-12-02 DIAGNOSIS — R490 Dysphonia: Secondary | ICD-10-CM | POA: Diagnosis not present

## 2021-12-02 DIAGNOSIS — J385 Laryngeal spasm: Secondary | ICD-10-CM | POA: Diagnosis not present

## 2021-12-02 DIAGNOSIS — R053 Chronic cough: Secondary | ICD-10-CM | POA: Diagnosis not present

## 2021-12-02 DIAGNOSIS — J383 Other diseases of vocal cords: Secondary | ICD-10-CM | POA: Diagnosis not present

## 2021-12-10 DIAGNOSIS — R490 Dysphonia: Secondary | ICD-10-CM | POA: Diagnosis not present

## 2021-12-10 DIAGNOSIS — J383 Other diseases of vocal cords: Secondary | ICD-10-CM | POA: Diagnosis not present

## 2021-12-10 DIAGNOSIS — R053 Chronic cough: Secondary | ICD-10-CM | POA: Diagnosis not present

## 2021-12-10 DIAGNOSIS — J385 Laryngeal spasm: Secondary | ICD-10-CM | POA: Diagnosis not present

## 2021-12-17 ENCOUNTER — Encounter: Payer: Self-pay | Admitting: Orthopedic Surgery

## 2021-12-17 ENCOUNTER — Ambulatory Visit (INDEPENDENT_AMBULATORY_CARE_PROVIDER_SITE_OTHER): Payer: Medicare PPO

## 2021-12-17 ENCOUNTER — Ambulatory Visit: Payer: Medicare PPO | Admitting: Orthopedic Surgery

## 2021-12-17 DIAGNOSIS — M25552 Pain in left hip: Secondary | ICD-10-CM

## 2021-12-17 NOTE — Progress Notes (Signed)
? ?Office Visit Note ?  ?Patient: Megan Brewer           ?Date of Birth: 04-05-1946           ?MRN: [id number] ?Visit Date: 12/17/2021 ?Requested by: Alda Berthold, DO ?[facility] ?[address] ?[place],   [id number] ?PCP: [name], NP ? ?Subjective: ?Chief Complaint  ?Patient presents with  ?? Left Hip - Pain  ? ? ?HPI: Megan Brewer is a 76 y.o. female who presents to the office complaining of left hip pain.  Patient states that her pain began in February without injury.  She states it began as a sharp, needlelike sensation in the lateral aspect of the hip.  It improved by itself for couple weeks and then has returned in full force over the last week or 2.  She complains of lateral pain with some radiation into her groin and anterior thigh.  She denies any new numbness or tingling or back pain.  She has been taking over-the-counter medications and BC/Goody's powders with some relief but still has severe pain.  She has history of arachnoiditis and has spinal cord stimulators in place.  She states this does not feel like the neuropathic pain she is accustomed to since she was 76 years old.  She feels somewhat weak but she has felt this way since her hospitalization last year due to sepsis and C. difficile infection.  No history of prior surgery or dislocation to the left hip.  Pain does wake her up and she cannot really lay on her left side.  She has history of decreased bone density for which she takes Prolia and Fosamax in the past..   ?             ?ROS: All systems reviewed are negative as they relate to the chief complaint within the history of present illness.  Patient denies fevers or chills. ? ?Assessment & Plan: ?Visit Diagnoses:  ?1. Pain of left hip   ? ? ?Plan: Patient is a 76 year old female who presents for evaluation of left hip pain.  Most of the pain is localized to the lateral aspect of the hip.  Radiographs demonstrate no evidence of fracture, dislocation, severe hip  arthritis.  Discussed the options available to patient.  With her continued pain over the last several months without any improvement, plan to order CT scan of the left hip and left femur to evaluate for stress fracture.  Also plan to evaluate with bone scan of the left hip for stress reaction.  Unfortunately, cannot do MRI scan due to her spinal cord stimulators.  Follow-up after scans to review results.  She is very against any sort of injection. ? ?Follow-Up Instructions: No follow-ups on file.  ? ?Orders:  ?Orders Placed This Encounter  ?Procedures  ?? XR HIP UNILAT W OR W/O PELVIS 2-3 VIEWS LEFT  ?? CT HIP LEFT WO CONTRAST  ?? CT FEMUR LEFT WO CONTRAST  ?? NM Bone Scan 3 Phase  ? ?No orders of the defined types were placed in this encounter. ? ? ? ? Procedures: ?No procedures performed ? ? ?Clinical Data: ?No additional findings. ? ?Objective: ?Vital Signs: There were no vitals taken for this visit. ? ?Physical Exam:  ?Constitutional: Patient appears well-developed ?HEENT:  ?Head: Normocephalic ?Eyes:EOM are normal ?Neck: Normal range of motion ?Cardiovascular: Normal rate ?Pulmonary/chest: Effort normal ?Neurologic: Patient is alert ?Skin: Skin is warm ?Psychiatric: Patient has normal mood and affect ? ?Ortho  Exam: Ortho exam demonstrates negative straight leg raise of both legs.  Severe tenderness over the greater trochanter and proximal femoral shaft of the left hip with minimal tenderness over the right hip.  Increased pain with hip range of motion, particularly with FADIR test on the left, no pain on the right.  Positive Stinchfield sign on the left, negative on the right.  Patient is able to ambulate with some antalgia.  No Trendelenburg gait noted.  She is able to actively internally rotate both hips with increased pain on the left. ? ?Specialty Comments:  ?No specialty comments available. ? ?Imaging: ?No results found. ? ? ?PMFS History: ?Patient Active Problem List  ? Diagnosis Date Noted  ?? Upper  airway cough syndrome with ? VCD  06/26/2021  ?? Precordial chest pain 06/05/2021  ?? Moderate mitral regurgitation 06/05/2021  ?? Essential hypertension 06/05/2021  ?? DOE (dyspnea on exertion) 06/05/2021  ?? Medication monitoring encounter 04/01/2021  ?? Gram-negative bacteremia 03/03/2021  ?? C. difficile diarrhea 03/03/2021  ?? Rigors 02/04/2021  ?? Acute metabolic encephalopathy 59/56/3875  ?? SIRS (systemic inflammatory response syndrome) ([place]) 01/14/2021  ?? Elevated troponin 01/14/2021  ?? Hypokalemia 01/14/2021  ?? Headache 01/14/2021  ?? Chest discomfort 03/22/2019  ?? Weight loss 01/27/2019  ?? Acquired hypothyroidism 01/27/2019  ?? Esophagitis 01/27/2019  ?? Intractable migraine without status migrainosus 01/27/2019  ?? Osteoporosis 01/27/2019  ?? Chronic sinusitis 12/28/2018  ?? Food intolerance/GI symptoms 12/28/2018  ?? Acute maxillary sinusitis 12/28/2018  ?? Lower leg edema 12/20/2018  ?? Chronic pain of both shoulders 05/24/2018  ?? Bilateral leg edema 05/24/2018  ?? Chronic pain syndrome 03/31/2018  ?? Left bundle branch block 10/28/2017  ?? Palpitations 10/28/2017  ?? History of gastric bypass 05/21/2017  ?? Polypharmacy 02/11/2017  ?? Senile nuclear sclerosis 11/26/2016  ?? Chronic post-thoracotomy pain 04/09/2014  ?? Chronic kidney disease 04/09/2014  ?? Sleep disturbances 09/20/2012  ?? Restless legs syndrome 07/07/2012  ?? Hypertonicity of bladder 05/23/2012  ?? Essential tremor 04/01/2012  ?? Memory loss 04/01/2012  ?? Incomplete emptying of bladder 01/14/2012  ?? Urge incontinence 10/28/2011  ?? Tension type headache 06/10/2011  ?? Mixed urge and stress incontinence 05/28/2011  ?? Hypogammaglobulinemia ([place]) 06/22/2010  ?? Severe episode of recurrent major depressive disorder, without psychotic features ([place]) 06/22/2010  ?? Hypothyroidism 02/12/2010  ?? 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 ARACHNOIDITIS 01/31/2010  ?? OBSTRUCTIVE SLEEP APNEA 01/31/2010  ?? Chronic rhinitis  01/31/2010  ?? Arachnoiditis 01/31/2010  ? ?Past Medical History:  ?Diagnosis Date  ?? Anemia   ?? Arachnoiditis   ?? Bipolar disorder ([place])   ?? C. difficile diarrhea   ?? Cataract   ? removed  ?? Chronic post-thoracotomy pain   ?? CKD (chronic kidney disease)   ?? GERD (gastroesophageal reflux disease)   ?? Hypogammaglobulinemia ([place])   ?? Hypotension   ?? Hypothyroid   ?? 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[place])   ?? 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[facility])   ?? Lung cancer ([place]) 2011, 2014  ?? Memory loss   ?? Migraine   ?? Opioid abuse ([place])   ?? Osteoporosis   ?? Presence of neurostimulator   ?? Restless legs   ?? SIRS (systemic inflammatory response syndrome) ([facility])   ?? Sleep apnea   ?  ?Family History  ?Problem Relation Age of Onset  ?? Hypertension Mother   ?? GER disease Mother   ?? Dementia Mother   ?? Osteoporosis Mother   ?? Arthritis Mother   ?? Bipolar disorder Mother   ??  Parkinson's disease Father   ?? Congestive Heart Failure Father   ?? Pneumonia Father   ?? Arthritis Father   ?? Dementia Father   ?? Depression Father   ?? Asthma Sister   ?? Depression Sister   ?? Bipolar disorder Brother   ?? Asthma Daughter   ?? Colon cancer Neg Hx   ?? Esophageal cancer Neg Hx   ?? Stomach cancer Neg Hx   ?? Rectal cancer Neg Hx   ?  ?Past Surgical History:  ?Procedure Laterality Date  ?? ABDOMINAL HYSTERECTOMY    ?? CARPAL TUNNEL RELEASE    ?? CATARACT EXTRACTION Bilateral 2019  ?? CHOLECYSTECTOMY    ?? COLONOSCOPY  2015  ? [facility]  ?? CT LUNG SCREENING  2018  ?? DENTAL SURGERY  06/17/2021  ? 4 teeth rmoved  ?? DG  BONE DENSITY ([facility])  2018  ?? DIAGNOSTIC MAMMOGRAM  2019  ?? GASTRIC BYPASS    ?? LUNG CANCER SURGERY  02/2010  ?? MULTIPLE TOOTH EXTRACTIONS    ?? PORT A CATH REVISION Right 04/2021  ?? PORT-A-CATH REMOVAL Left 02/07/2021  ? Procedure: REMOVAL PORT-A-CATH;  Surgeon: Kinsinger, Arta Bruce, MD;  Location: [facility];  Service: General;  Laterality: Left;  60 MINUTES ROOM 4  ?? SPINAL CORD STIMULATOR IMPLANT     ? ?Social History  ? ?Occupational History  ?? Occupation: retired  ?Tobacco Use  ?? Smoking status: Never  ?? Smokeless tobacco: Never  ?Vaping Use  ?? Vaping Use: Never used  ?Substance and Sexual Activity

## 2021-12-21 ENCOUNTER — Encounter: Payer: Self-pay | Admitting: Orthopedic Surgery

## 2021-12-29 ENCOUNTER — Ambulatory Visit
Admission: RE | Admit: 2021-12-29 | Discharge: 2021-12-29 | Disposition: A | Discharge status: Home / Self Care | Payer: Medicare PPO | Source: Ambulatory Visit | Attending: Orthopedic Surgery | Admitting: Orthopedic Surgery

## 2021-12-29 ENCOUNTER — Other Ambulatory Visit: Payer: Medicare PPO

## 2021-12-29 ENCOUNTER — Ambulatory Visit: Payer: Medicare PPO | Admitting: Nurse Practitioner

## 2021-12-29 ENCOUNTER — Encounter: Payer: Self-pay | Admitting: Nurse Practitioner

## 2021-12-29 VITALS — BP 118/60 | HR 73 | Temp 97.1°F | Resp 18 | Ht 64.0 in | Wt 142.0 lb

## 2021-12-29 DIAGNOSIS — M1612 Unilateral primary osteoarthritis, left hip: Secondary | ICD-10-CM | POA: Diagnosis not present

## 2021-12-29 DIAGNOSIS — R600 Localized edema: Secondary | ICD-10-CM

## 2021-12-29 DIAGNOSIS — E039 Hypothyroidism, unspecified: Secondary | ICD-10-CM

## 2021-12-29 DIAGNOSIS — D801 Nonfamilial hypogammaglobulinemia: Secondary | ICD-10-CM | POA: Diagnosis not present

## 2021-12-29 DIAGNOSIS — D696 Thrombocytopenia, unspecified: Secondary | ICD-10-CM

## 2021-12-29 DIAGNOSIS — M25552 Pain in left hip: Secondary | ICD-10-CM

## 2021-12-29 DIAGNOSIS — I10 Essential (primary) hypertension: Secondary | ICD-10-CM

## 2021-12-29 DIAGNOSIS — J3489 Other specified disorders of nose and nasal sinuses: Secondary | ICD-10-CM | POA: Diagnosis not present

## 2021-12-29 DIAGNOSIS — N1831 Chronic kidney disease, stage 3a: Secondary | ICD-10-CM

## 2021-12-29 DIAGNOSIS — M810 Age-related osteoporosis without current pathological fracture: Secondary | ICD-10-CM

## 2021-12-29 DIAGNOSIS — F332 Major depressive disorder, recurrent severe without psychotic features: Secondary | ICD-10-CM

## 2021-12-29 DIAGNOSIS — M1712 Unilateral primary osteoarthritis, left knee: Secondary | ICD-10-CM | POA: Diagnosis not present

## 2021-12-29 MED ORDER — MUPIROCIN 2 % EX OINT: 1.0000 "application " | TOPICAL_OINTMENT | Freq: Two times a day (BID) | CUTANEOUS | 0 refills | Status: DC

## 2021-12-29 NOTE — Patient Instructions (Signed)
Make sure you are taking calcium 600 mg twice daily ?Vit d 1000-2000 units daily  ?

## 2021-12-29 NOTE — Progress Notes (Signed)
? ? ?Careteam: ?Patient Care Team: ?[name], NP as PCP - General (Geriatric Medicine) ?Minus Breeding, MD as PCP - Cardiology (Cardiology) ?Alda Berthold, DO as Consulting Physician (Neurology) ? ?PLACE OF SERVICE:  ?[facility]  ?Advanced Directive information ?Does Patient Have a Medical Advance Directive?: No, Would patient like information on creating a medical advance directive?: No - Patient declined ? ?Allergies  ?Allergen Reactions  ? Imitrex [Sumatriptan] Nausea And Vomiting  ?  Ringing in the ears, migraines are worse  ? Tetracycline Nausea Only  ?  Causes ringing in ears, dizziness, migraine, nausea  ? Corticosteroids Other (See Comments)  ?  12/02/2016 interview by Melissa Montane: Oral dosing causes migraines/nausea/tinnitus. Prev tolerated IV and intranasal admin w/o difficulty.  ? Cefixime Other (See Comments)  ?  Headache ?  ? Ciprofloxacin Other (See Comments)  ?  Headache, dizziness, ringing in the ears  ? Doxycycline Other (See Comments)  ?  unknown  ? Gabapentin   ?  Throat swells/closes  ? Isometheptene-Dichloral-Apap Other (See Comments)  ?  unknown  ? Ketorolac   ?  12/02/2016 interview by Melissa Montane: Oral dosing prednisone/corticosteroids causes migraines/nausea/tinnitus. Prev tolerated IV and intranasal admin w/o difficulty.  ? Prednisone Other (See Comments)  ?  Migraine, dizzy, ringing in ears-can take it IV ?12/02/2016 interview by [name]: Oral prednisone dosing causes migraines/nausea/tinnitus. Prev tolerated IV and intranasal admin w/o difficulty.  ? Promethazine Other (See Comments)  ?  causes severe abdominal pain when taken IV; however she can take PO or IM  ? Stadol [Butorphanol]   ?  Cerebral pain  ? Tape Other (See Comments)  ?  Adhesive tapes-patient denies  ? Erythromycin Base Diarrhea and Itching  ?  Severe diarrhea  ? Propoxyphene Nausea Only  ? ? ?Chief Complaint  ?Patient presents with  ? Medical Management of Chronic Issues  ?  Patient is here for a follow up for chronic conditions  patient is also taking a prebiotic and a probiotic not sure of dosage other than once a day ?  ? ? ? ?HPI: Patient is a 76 y.o. female for routine follow up ? ?Having significant pain in her left hip. Undergoing work up by orthopedic at this time.  ? ?LE edema- had increased and was increased to torsemide to 40 mg but due to worsening renal function was decreased back to 20 mg daily and renal function improved.  ?Swelling "not great"  ?She is not taking compression hose because they were rolling down.  ?Did not help significantly.  ?Low sodium diet.  ? ?Reports she is occasionally still having shortness of breath. She will have palpitations, breathlessness and dizziness after eating but she can eat it again and not have a problem. No pattern or reason.  ?Really throws her for a loop when it happens. Followed with cardiology without abnormal findings.  ? ?GERD_ controlled on dexilant.  ? ?Osteoporosis- getting prolia- bone density is scheduled. She is on calcium and vit d  ? ?Hypothyroid- tsh at goal on recent labs.  ? ?Review of Systems:  ?Review of Systems  ?Constitutional:  Negative for chills, fever and weight loss.  ?HENT:  Negative for tinnitus.   ?Respiratory:  Negative for cough, sputum production and shortness of breath.   ?Cardiovascular:  Positive for leg swelling. Negative for chest pain and palpitations.  ?Gastrointestinal:  Negative for abdominal pain, constipation, diarrhea and heartburn.  ?Genitourinary:  Negative for dysuria, frequency and urgency.  ?Musculoskeletal:  Positive for joint pain. Negative  for back pain, falls and myalgias.  ?Skin: Negative.   ?Neurological:  Negative for dizziness and headaches.  ?Psychiatric/Behavioral:  Negative for depression and memory loss. The patient does not have insomnia.   ? ?Past Medical History:  ?Diagnosis Date  ? Anemia   ? Arachnoiditis   ? Bipolar disorder ([place])   ? C. difficile diarrhea   ? Cataract   ? removed  ? Chronic post-thoracotomy pain   ? CKD  (chronic kidney disease)   ? GERD (gastroesophageal reflux disease)   ? Hypogammaglobulinemia ([place])   ? Hypotension   ? Hypothyroid   ? 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HCC)   ? Lung cancer ([place]) 2011, 2014  ? Memory loss   ? Migraine   ? Opioid abuse ([place])   ? Osteoporosis   ? Presence of neurostimulator   ? Restless legs   ? SIRS (systemic inflammatory response syndrome) (HCC)   ? Sleep apnea   ? ?Past Surgical History:  ?Procedure Laterality Date  ? ABDOMINAL HYSTERECTOMY    ? CARPAL TUNNEL RELEASE    ? CATARACT EXTRACTION Bilateral 2019  ? CHOLECYSTECTOMY    ? COLONOSCOPY  2015  ? [facility]  ? CT LUNG SCREENING  2018  ? DENTAL SURGERY  06/17/2021  ? 4 teeth rmoved  ? DG  BONE DENSITY ([place] [facility])  2018  ? DIAGNOSTIC MAMMOGRAM  2019  ? GASTRIC BYPASS    ? LUNG CANCER SURGERY  02/2010  ? MULTIPLE TOOTH EXTRACTIONS    ? PORT A CATH REVISION Right 04/2021  ? PORT-A-CATH REMOVAL Left 02/07/2021  ? Procedure: REMOVAL PORT-A-CATH;  Surgeon: Kinsinger, Arta Bruce, MD;  Location: [facility];  Service: General;  Laterality: Left;  60 MINUTES ROOM 4  ? SPINAL CORD STIMULATOR IMPLANT    ? ?Social History: ?  reports that she has never smoked. She has never used smokeless tobacco. She reports that she does not currently use alcohol. She reports that she does not use drugs. ? ?Family History  ?Problem Relation Age of Onset  ? Hypertension Mother   ? GER disease Mother   ? Dementia Mother   ? Osteoporosis Mother   ? Arthritis Mother   ? Bipolar disorder Mother   ? Parkinson's disease Father   ? Congestive Heart Failure Father   ? Pneumonia Father   ? Arthritis Father   ? Dementia Father   ? Depression Father   ? Asthma Sister   ? Depression Sister   ? Bipolar disorder Brother   ? Asthma Daughter   ? Colon cancer Neg Hx   ? Esophageal cancer Neg Hx   ? Stomach cancer Neg Hx   ? Rectal cancer Neg Hx   ? ? ?Medications: ?Patient's Medications  ?New Prescriptions  ? No medications on file  ?Previous  Medications  ? ACETAMINOPHEN (TYLENOL) 500 MG TABLET    Take 500 mg by mouth every 6 (six) hours as needed for moderate pain.  ? AZELASTINE (ASTELIN) 0.1 % NASAL SPRAY    Place 2 sprays into both nostrils 2 (two) times daily.  ? BACLOFEN (LIORESAL) 10 MG TABLET    Take 10 mg by mouth daily as needed for muscle spasms.  ? BUPROPION (WELLBUTRIN XL) 150 MG 24 HR TABLET    Take 1 tablet (150 mg total) by mouth daily.  ? CALCIUM CITRATE 200 MG TABS    Take 200 mg of elemental calcium by mouth daily.  ? CARBIDOPA-LEVODOPA (SINEMET IR)  25-100 MG TABLET    Take as needed for severe restless leg syndrome.  ? CHOLECALCIFEROL (VITAMIN D3) 25 MCG (1000 UNIT) TABLET    Take 1,000 Units by mouth daily.  ? CYCLOSPORINE (RESTASIS) 0.05 % OPHTHALMIC EMULSION    Place 1 drop into both eyes 2 (two) times daily.  ? DEXLANSOPRAZOLE (DEXILANT) 60 MG CAPSULE    Take 1 capsule (60 mg total) by mouth daily. Crack open the capsule and pour into spoon prior to ingestion.  ? FAMOTIDINE (PEPCID) 20 MG TABLET    One after supper  ? FERROUS SULFATE 325 (65 FE) MG TABLET    Take 650 mg by mouth daily with breakfast.  ? HEPARIN LOCK FLUSH 100 UNIT/ML SOLN INJECTION    Inject 500 Units into the vein every 21 ( twenty-one) days.  ? IBUPROFEN (ADVIL) 200 MG TABLET    Take 200 mg by mouth as needed.  ? IMMUNE GLOBULIN, HUMAN, (GAMMAGARD S/D LESS IGA) 10 G INJECTION    Inject 2 g/kg into the vein every 21 ( twenty-one) days.  ? KRILL OIL PO    Take by mouth.  ? LAMOTRIGINE (LAMICTAL) 200 MG TABLET    Take 1 tablet (200 mg total) by mouth at bedtime.  ? LEVOTHYROXINE (SYNTHROID) 75 MCG TABLET    TAKE ONE (1) TABLET BY MOUTH EACH DAY 30MINUTES BEFORE BREAKFAST ON EMPTY STOMACH  ? MAGNESIUM OXIDE (MAG-OX) 400 MG TABLET    Take 400 mg by mouth daily.   ? MELATONIN 5 MG TABS    Take 5 mg by mouth.  ? MIRTAZAPINE (REMERON) 30 MG TABLET    Take 1 tablet (30 mg total) by mouth at bedtime.  ? OXYBUTYNIN (DITROPAN) 5 MG/5ML SYRUP    TAKE 2.5 MLS BY MOUTH TWICE A  DAY  ? ROPINIROLE (REQUIP) 1 MG TABLET    Take 0.5 tablet at 2p and 1.5 tablet 2-3 hour prior to bedtime.  ? SODIUM CHLORIDE, PF, 0.9 % INJECTION    Inject 5-10 mLs into the vein as needed (Immunoglobulin

## 2021-12-30 ENCOUNTER — Ambulatory Visit
Admission: RE | Admit: 2021-12-30 | Discharge: 2021-12-30 | Disposition: A | Discharge status: Home / Self Care | Payer: Medicare PPO | Source: Ambulatory Visit | Attending: Orthopedic Surgery | Admitting: Orthopedic Surgery

## 2021-12-30 DIAGNOSIS — M25552 Pain in left hip: Secondary | ICD-10-CM

## 2021-12-30 DIAGNOSIS — D696 Thrombocytopenia, unspecified: Secondary | ICD-10-CM | POA: Insufficient documentation

## 2021-12-30 DIAGNOSIS — M1612 Unilateral primary osteoarthritis, left hip: Secondary | ICD-10-CM | POA: Diagnosis not present

## 2021-12-30 DIAGNOSIS — M1712 Unilateral primary osteoarthritis, left knee: Secondary | ICD-10-CM | POA: Diagnosis not present

## 2022-01-05 DIAGNOSIS — J383 Other diseases of vocal cords: Secondary | ICD-10-CM | POA: Diagnosis not present

## 2022-01-09 ENCOUNTER — Other Ambulatory Visit: Payer: Medicare PPO

## 2022-01-14 ENCOUNTER — Ambulatory Visit: Payer: Medicare PPO | Admitting: Orthopedic Surgery

## 2022-01-15 ENCOUNTER — Ambulatory Visit (HOSPITAL_COMMUNITY)
Admission: RE | Admit: 2022-01-15 | Discharge: 2022-01-15 | Disposition: A | Discharge status: Home / Self Care | Payer: Medicare PPO | Source: Ambulatory Visit | Attending: Orthopedic Surgery | Admitting: Orthopedic Surgery

## 2022-01-15 ENCOUNTER — Encounter (HOSPITAL_COMMUNITY)
Admission: RE | Admit: 2022-01-15 | Discharge: 2022-01-15 | Disposition: A | Discharge status: Home / Self Care | Payer: Medicare PPO | Source: Ambulatory Visit | Attending: Orthopedic Surgery | Admitting: Orthopedic Surgery

## 2022-01-15 DIAGNOSIS — Z85118 Personal history of other malignant neoplasm of bronchus and lung: Secondary | ICD-10-CM | POA: Diagnosis not present

## 2022-01-15 DIAGNOSIS — M25552 Pain in left hip: Secondary | ICD-10-CM | POA: Insufficient documentation

## 2022-01-15 MED ORDER — HEPARIN SOD (PORK) LOCK FLUSH 100 UNIT/ML IV SOLN
500.0000 [IU] | INTRAVENOUS | Status: AC | PRN
  Administered 2022-01-15: 500 [IU]

## 2022-01-15 MED ORDER — TECHNETIUM TC 99M MEDRONATE IV KIT
20.0000 | PACK | Freq: Once | INTRAVENOUS | Status: AC | PRN
  Administered 2022-01-15: 20 via INTRAVENOUS

## 2022-02-04 ENCOUNTER — Ambulatory Visit: Payer: Medicare PPO | Admitting: Orthopedic Surgery

## 2022-02-05 DIAGNOSIS — J383 Other diseases of vocal cords: Secondary | ICD-10-CM | POA: Diagnosis not present

## 2022-02-13 DIAGNOSIS — R5383 Other fatigue: Secondary | ICD-10-CM | POA: Diagnosis not present

## 2022-02-13 DIAGNOSIS — R0989 Other specified symptoms and signs involving the circulatory and respiratory systems: Secondary | ICD-10-CM | POA: Diagnosis not present

## 2022-02-13 DIAGNOSIS — Z7952 Long term (current) use of systemic steroids: Secondary | ICD-10-CM | POA: Diagnosis not present

## 2022-03-02 ENCOUNTER — Other Ambulatory Visit: Payer: Self-pay | Admitting: Physician Assistant

## 2022-03-08 ENCOUNTER — Encounter (HOSPITAL_BASED_OUTPATIENT_CLINIC_OR_DEPARTMENT_OTHER): Payer: Self-pay | Admitting: Emergency Medicine

## 2022-03-08 ENCOUNTER — Other Ambulatory Visit: Payer: Self-pay

## 2022-03-08 ENCOUNTER — Emergency Department (HOSPITAL_COMMUNITY): Payer: Medicare PPO

## 2022-03-08 ENCOUNTER — Emergency Department (HOSPITAL_BASED_OUTPATIENT_CLINIC_OR_DEPARTMENT_OTHER)
Admission: EM | Admit: 2022-03-08 | Discharge: 2022-03-08 | Disposition: A | Discharge status: Home / Self Care | Payer: Medicare PPO | Attending: Emergency Medicine | Admitting: Emergency Medicine

## 2022-03-08 DIAGNOSIS — R1110 Vomiting, unspecified: Secondary | ICD-10-CM | POA: Diagnosis not present

## 2022-03-08 DIAGNOSIS — R748 Abnormal levels of other serum enzymes: Secondary | ICD-10-CM | POA: Diagnosis not present

## 2022-03-08 DIAGNOSIS — R112 Nausea with vomiting, unspecified: Secondary | ICD-10-CM | POA: Diagnosis not present

## 2022-03-08 DIAGNOSIS — I700 Atherosclerosis of aorta: Secondary | ICD-10-CM | POA: Diagnosis not present

## 2022-03-08 DIAGNOSIS — R1012 Left upper quadrant pain: Secondary | ICD-10-CM | POA: Diagnosis present

## 2022-03-08 DIAGNOSIS — R42 Dizziness and giddiness: Secondary | ICD-10-CM | POA: Diagnosis not present

## 2022-03-08 DIAGNOSIS — R1013 Epigastric pain: Secondary | ICD-10-CM | POA: Insufficient documentation

## 2022-03-08 DIAGNOSIS — R109 Unspecified abdominal pain: Secondary | ICD-10-CM | POA: Diagnosis not present

## 2022-03-08 LAB — CBC WITH DIFFERENTIAL/PLATELET
Abs Immature Granulocytes: 0.03 10*3/uL (ref 0.00–0.07)
Basophils Absolute: 0.1 10*3/uL (ref 0.0–0.1)
Basophils Relative: 1 %
Eosinophils Absolute: 0.1 10*3/uL (ref 0.0–0.5)
Eosinophils Relative: 2 %
HCT: 39.8 % (ref 36.0–46.0)
Hemoglobin: 13.5 g/dL (ref 12.0–15.0)
Immature Granulocytes: 0 %
Lymphocytes Relative: 21 %
Lymphs Abs: 1.4 10*3/uL (ref 0.7–4.0)
MCH: 29.7 pg (ref 26.0–34.0)
MCHC: 33.9 g/dL (ref 30.0–36.0)
MCV: 87.5 fL (ref 80.0–100.0)
Monocytes Absolute: 0.5 10*3/uL (ref 0.1–1.0)
Monocytes Relative: 7 %
Neutro Abs: 4.8 10*3/uL (ref 1.7–7.7)
Neutrophils Relative %: 69 %
Platelets: 189 10*3/uL (ref 150–400)
RBC: 4.55 MIL/uL (ref 3.87–5.11)
RDW: 12.9 % (ref 11.5–15.5)
WBC: 6.9 10*3/uL (ref 4.0–10.5)
nRBC: 0 % (ref 0.0–0.2)

## 2022-03-08 LAB — COMPREHENSIVE METABOLIC PANEL
ALT: 25 U/L (ref 0–44)
AST: 28 U/L (ref 15–41)
Albumin: 3.5 g/dL (ref 3.5–5.0)
Alkaline Phosphatase: 81 U/L (ref 38–126)
Anion gap: 10 (ref 5–15)
BUN: 33 mg/dL — ABNORMAL HIGH (ref 8–23)
CO2: 25 mmol/L (ref 22–32)
Calcium: 9.2 mg/dL (ref 8.9–10.3)
Chloride: 102 mmol/L (ref 98–111)
Creatinine, Ser: 1.37 mg/dL — ABNORMAL HIGH (ref 0.44–1.00)
GFR, Estimated: 41 mL/min — ABNORMAL LOW (ref >60)
Glucose, Bld: 127 mg/dL — ABNORMAL HIGH (ref 70–99)
Potassium: 3 mmol/L — ABNORMAL LOW (ref 3.5–5.1)
Sodium: 137 mmol/L (ref 135–145)
Total Bilirubin: 0.7 mg/dL (ref 0.3–1.2)
Total Protein: 7.5 g/dL (ref 6.5–8.1)

## 2022-03-08 LAB — URINALYSIS, MICROSCOPIC (REFLEX)

## 2022-03-08 LAB — URINALYSIS, ROUTINE W REFLEX MICROSCOPIC
Bilirubin Urine: NEGATIVE
Glucose, UA: NEGATIVE mg/dL
Hgb urine dipstick: NEGATIVE
Ketones, ur: NEGATIVE mg/dL
Leukocytes,Ua: NEGATIVE
Nitrite: POSITIVE — AB
Protein, ur: NEGATIVE mg/dL
Specific Gravity, Urine: 1.01 (ref 1.005–1.030)
pH: 5 (ref 5.0–8.0)

## 2022-03-08 LAB — LIPASE, BLOOD: Lipase: 52 U/L — ABNORMAL HIGH (ref 11–51)

## 2022-03-08 LAB — LACTIC ACID, PLASMA: Lactic Acid, Venous: 0.9 mmol/L (ref 0.5–1.9)

## 2022-03-08 LAB — MAGNESIUM: Magnesium: 1.8 mg/dL (ref 1.7–2.4)

## 2022-03-08 LAB — TROPONIN I (HIGH SENSITIVITY)
Troponin I (High Sensitivity): 6 ng/L (ref <18)
Troponin I (High Sensitivity): 7 ng/L (ref <18)

## 2022-03-08 MED ORDER — POTASSIUM CHLORIDE CRYS ER 20 MEQ PO TBCR
40.0000 meq | EXTENDED_RELEASE_TABLET | Freq: Once | ORAL | Status: AC
  Administered 2022-03-08: 40 meq via ORAL
  Filled 2022-03-08: qty 2

## 2022-03-08 MED ORDER — HEPARIN SOD (PORK) LOCK FLUSH 100 UNIT/ML IV SOLN
INTRAVENOUS | Status: AC
  Filled 2022-03-08: qty 5

## 2022-03-08 MED ORDER — MORPHINE SULFATE (PF) 4 MG/ML IV SOLN
4.0000 mg | Freq: Once | INTRAVENOUS | Status: AC
  Administered 2022-03-08: 4 mg via INTRAVENOUS
  Filled 2022-03-08: qty 1

## 2022-03-08 MED ORDER — SODIUM CHLORIDE (PF) 0.9 % IJ SOLN
INTRAMUSCULAR | Status: AC
  Filled 2022-03-08: qty 50

## 2022-03-08 MED ORDER — ONDANSETRON HCL 4 MG/2ML IJ SOLN
4.0000 mg | Freq: Once | INTRAMUSCULAR | Status: AC
  Administered 2022-03-08: 4 mg via INTRAVENOUS
  Filled 2022-03-08: qty 2

## 2022-03-08 MED ORDER — IOHEXOL 350 MG/ML SOLN
75.0000 mL | Freq: Once | INTRAVENOUS | Status: AC | PRN
  Administered 2022-03-08: 75 mL via INTRAVENOUS

## 2022-03-08 MED ORDER — MAGNESIUM OXIDE -MG SUPPLEMENT 400 (240 MG) MG PO TABS
800.0000 mg | ORAL_TABLET | Freq: Once | ORAL | Status: AC
  Administered 2022-03-08: 800 mg via ORAL
  Filled 2022-03-08: qty 2

## 2022-03-08 MED ORDER — LORAZEPAM 2 MG/ML IJ SOLN
0.5000 mg | Freq: Once | INTRAMUSCULAR | Status: AC
  Administered 2022-03-08: 0.5 mg via INTRAVENOUS
  Filled 2022-03-08: qty 1

## 2022-03-08 MED ORDER — SODIUM CHLORIDE 0.9 % IV SOLN
1.0000 g | Freq: Once | INTRAVENOUS | Status: AC
  Administered 2022-03-08: 1 g via INTRAVENOUS
  Filled 2022-03-08: qty 10

## 2022-03-08 MED ORDER — POTASSIUM CHLORIDE 10 MEQ/100ML IV SOLN
10.0000 meq | INTRAVENOUS | Status: AC
  Administered 2022-03-08 (×2): 10 meq via INTRAVENOUS
  Filled 2022-03-08 (×2): qty 100

## 2022-03-08 MED ORDER — SODIUM CHLORIDE 0.9 % IV BOLUS
1000.0000 mL | Freq: Once | INTRAVENOUS | Status: AC
  Administered 2022-03-08: 1000 mL via INTRAVENOUS

## 2022-03-08 NOTE — ED Triage Notes (Signed)
Pt reports vomiting emesis with black specks this morning; sts she has chronic abd pain; denies diarrhea

## 2022-03-08 NOTE — ED Provider Notes (Signed)
Patient transferred from outside facility for CT scan.  Presenting with periumbilical and left-sided abdominal pain with nausea and vomiting x1 since last night.  Had some specks of black in her emesis that she was concerned for blood.  Also complaining of pain to her left neck and scapular area without evidence of trauma.  EKG is left bundle branch block  Laboratory studies showed hypokalemia and nitrite positive urinalysis. Potassium was replaced at outside hospital.   Patient sent for CT scan given her history of gastric bypass.  Also significant right lower quadrant pain will also evaluate for appendicitis.   CT scan shows stable postsurgical changes.  No evidence of bowel obstruction.  Appendix not identified.  On reassessment, patient's pain is improved.  She is tolerating p.o.  Abdomen is soft and nontender.  Urinalysis sent for questionable UTI.  Culture is pending.  Denies any urinary symptoms.  Patient's neck and scapular pain has resolved as well.  Troponin negative x2 without evidence of ACS.  Low suspicion for pulmonary embolism or aortic dissection. CTA negative for any stenosis or aneurysm.  Consider cervical spine MRI given patient's radicular symptoms, neck pain and shoulder pain.  She states she has implants and cannot have an MRI.  She has good strength and sensation with low suspicion for acute cord compression.  Discussed outpatient follow-up with PCP.  Discussed return to the ED with worsening abdominal pain especially to the right lower quadrant, fever, vomiting or other concerns.    Ezequiel Essex, MD 03/08/22 2110

## 2022-03-08 NOTE — ED Notes (Signed)
Report given to [facility] ED Charge RN

## 2022-03-08 NOTE — Discharge Instructions (Signed)
Your testing is reassuring.  No evidence of appendicitis but appendix was not identified on CT scan.  No complications from previous surgeries.  Keep yourself hydrated.  You will be contacted if your urine culture is positive and you need antibiotics.  Return to the ED with worsening pain especially the right lower abdomen, fever, vomiting, not able to eat or drink or other concerns.

## 2022-03-08 NOTE — ED Provider Notes (Addendum)
[facility] EMERGENCY DEPARTMENT Provider Note   CSN: [id number] Arrival date & time: 03/08/22  1211     History  Chief Complaint  Patient presents with   Abdominal Pain    Megan Brewer is a 76 y.o. female.  76 yo F with a chief complaints of abdominal pain nausea and vomiting.  The patient tells me that she has some abdominal pain not infrequently.  This feels different.  Also to the left upper quadrant.  Had had some emesis with some black specks in it that she was concerned might be blood.  Has had gastric bypass in the remote past.  No fevers or chills.  No diarrhea.  No urinary symptoms.   Abdominal Pain      Home Medications Prior to Admission medications   Medication Sig Start Date End Date Taking? Authorizing Provider  acetaminophen (TYLENOL) 500 MG tablet Take 500 mg by mouth every 6 (six) hours as needed for moderate pain.    [provider]  azelastine (ASTELIN) 0.1 % nasal spray Place 2 sprays into both nostrils 2 (two) times daily. 03/10/21   Dara Hoyer, FNP  baclofen (LIORESAL) 10 MG tablet Take 10 mg by mouth daily as needed for muscle spasms. 06/03/20   [provider]  buPROPion (WELLBUTRIN XL) 150 MG 24 hr tablet Take 1 tablet (150 mg total) by mouth daily. 10/10/21   Thayer Headings, PMHNP  Calcium Citrate 200 MG TABS Take 200 mg of elemental calcium by mouth daily. 12/02/16   [provider]  carbidopa-levodopa (SINEMET IR) 25-100 MG tablet Take as needed for severe restless leg syndrome. 03/06/21   Narda Amber K, DO  cholecalciferol (VITAMIN D3) 25 MCG (1000 UNIT) tablet Take 1,000 Units by mouth daily.    [provider]  cycloSPORINE (RESTASIS) 0.05 % ophthalmic emulsion Place 1 drop into both eyes 2 (two) times daily.    [provider]  dexlansoprazole (DEXILANT) 60 MG capsule Take 1 capsule (60 mg total) by mouth daily. **PLEASE CONTACT OFFICE TO SCHEDULE APPOINTMENT 03/02/22   [place], Amy S,  PA-C  famotidine (PEPCID) 20 MG tablet One after supper 06/26/21   Tanda Rockers, MD  ferrous sulfate 325 (65 FE) MG tablet Take 650 mg by mouth daily with breakfast.    [provider]  heparin lock flush 100 UNIT/ML SOLN injection Inject 500 Units into the vein every 21 ( twenty-one) days. 03/31/21   [provider]  ibuprofen (ADVIL) 200 MG tablet Take 200 mg by mouth as needed.    [provider]  immune globulin, human, (GAMMAGARD S/D LESS IGA) 10 g injection Inject 2 g/kg into the vein every 21 ( twenty-one) days.    [provider]  KRILL OIL PO Take by mouth.    [provider]  lamoTRIgine (LAMICTAL) 200 MG tablet Take 1 tablet (200 mg total) by mouth at bedtime. 10/10/21 01/08/22  Thayer Headings, PMHNP  levothyroxine (SYNTHROID) 75 MCG tablet TAKE ONE (1) TABLET BY MOUTH EACH DAY 30MINUTES BEFORE BREAKFAST ON EMPTY STOMACH 09/02/21   [name], NP  magnesium oxide (MAG-OX) 400 MG tablet Take 400 mg by mouth daily.     [provider]  melatonin 5 MG TABS Take 5 mg by mouth.    [provider]  mirtazapine (REMERON) 30 MG tablet Take 1 tablet (30 mg total) by mouth at bedtime. 08/28/21 11/26/21  Thayer Headings, PMHNP  mupirocin ointment (BACTROBAN) 2 % Apply 1 application.  topically 2 (two) times daily. 12/29/21   [name], NP  oxybutynin (DITROPAN) 5 MG/5ML syrup TAKE 2.5 MLS BY MOUTH TWICE A DAY 09/09/21   [name], NP  rOPINIRole (REQUIP) 1 MG tablet Take 0.5 tablet at 2p and 1.5 tablet 2-3 hour prior to bedtime. 11/14/21   Narda Amber K, DO  sodium chloride, PF, 0.9 % injection Inject 5-10 mLs into the vein as needed (Immunoglobulin). 02/12/21   [provider]  tamsulosin (FLOMAX) 0.4 MG CAPS capsule Take 1 capsule (0.4 mg total) by mouth daily. 01/23/21   Shelly Coss, MD  torsemide (DEMADEX) 20 MG tablet Take 20 mg by mouth daily.    [provider]  triamcinolone cream  (KENALOG) 0.1 % Apply 1 application topically 2 (two) times daily as needed (rash).    [provider]  UNABLE TO FIND Take 1 tablet by mouth daily. Med Name: Megan Brewer 1 by mouth daily for UTI prevention    [provider]  UNABLE TO [place]    [provider]  vitamin B-12 (CYANOCOBALAMIN) 1000 MCG tablet TAKE 1 TABLET BY MOUTH DAILY 02/14/21   [provider]  ZINC OXIDE, TOPICAL, 10 % CREA Apply 1 application topically 2 (two) times daily as needed (rash). 02/14/21   Aline August, MD      Allergies    Imitrex [sumatriptan], Tetracycline, Corticosteroids, Cefixime, Ciprofloxacin, Doxycycline, Gabapentin, Isometheptene-dichloral-apap, Ketorolac, Prednisone, Promethazine, Stadol [butorphanol], Tape, Erythromycin base, and Propoxyphene    Review of Systems   Review of Systems  Gastrointestinal:  Positive for abdominal pain.    Physical Exam Updated Vital Signs BP 121/67   Pulse 91   Temp 98 F (36.7 C) (Oral)   Resp (!) 22   SpO2 99%  Physical Exam Vitals and nursing note reviewed.  Constitutional:      General: She is not in acute distress.    Appearance: She is well-developed. She is not diaphoretic.  HENT:     Head: Normocephalic and atraumatic.  Eyes:     Pupils: Pupils are equal, round, and reactive to light.  Cardiovascular:     Rate and Rhythm: Normal rate and regular rhythm.     Heart sounds: No murmur heard.    No friction rub. No gallop.  Pulmonary:     Effort: Pulmonary effort is normal.     Breath sounds: No wheezing or rales.  Abdominal:     General: There is no distension.     Palpations: Abdomen is soft.     Tenderness: There is abdominal tenderness in the epigastric area.  Musculoskeletal:        General: No tenderness.     Cervical back: Normal range of motion and neck supple.  Skin:    General: Skin is warm and dry.  Neurological:     Mental Status: She is alert and oriented to person, place, and time.   Psychiatric:        Behavior: Behavior normal.     ED Results / Procedures / Treatments   Labs (all labs ordered are listed, but only abnormal results are displayed) Labs Reviewed  COMPREHENSIVE METABOLIC PANEL - Abnormal; Notable for the following components:      Result Value   Potassium 3.0 (*)    Glucose, Bld 127 (*)    BUN 33 (*)    Creatinine, Ser 1.37 (*)    GFR, Estimated 41 (*)    All other components within normal limits  LIPASE, BLOOD -  Abnormal; Notable for the following components:   Lipase 52 (*)    All other components within normal limits  CBC WITH DIFFERENTIAL/PLATELET  LACTIC ACID, PLASMA  URINALYSIS, ROUTINE W REFLEX MICROSCOPIC  MAGNESIUM    EKG EKG Interpretation  Date/Time:  Sunday March 08 2022 12:27:44 EDT Ventricular Rate:  83 PR Interval:  160 QRS Duration: 130 QT Interval:  381 QTC Calculation: 448 R Axis:   66 Text Interpretation: Sinus rhythm Left bundle branch block No significant change since last tracing Confirmed by Deno Etienne [phone]) on 03/08/2022 12:34:15 PM  Radiology No results found.  Procedures Procedures    Medications Ordered in ED Medications  potassium chloride 10 mEq in 100 mL IVPB (has no administration in time range)  potassium chloride SA (KLOR-CON M) CR tablet 40 mEq (has no administration in time range)  magnesium oxide (MAG-OX) tablet 800 mg (has no administration in time range)  morphine (PF) 4 MG/ML injection 4 mg (has no administration in time range)  ondansetron (ZOFRAN) injection 4 mg (has no administration in time range)  sodium chloride 0.9 % bolus 1,000 mL (1,000 mLs Intravenous New Bag/Given 03/08/22 1254)  morphine (PF) 4 MG/ML injection 4 mg (4 mg Intravenous Given 03/08/22 1256)  ondansetron (ZOFRAN) injection 4 mg (4 mg Intravenous Given 03/08/22 1256)    ED Course/ Medical Decision Making/ A&P                           Medical Decision Making Amount and/or Complexity of Data Reviewed Labs:  ordered. ECG/medicine tests: ordered.  Risk OTC drugs. Prescription drug management.   76 yo F with a chief complaint of abdominal pain nausea and vomiting.  This been going on for couple days.  She ended up vomiting today what she thought was potentially blood.  Describes black specks in her emesis.  Has a history of chronic abdominal pain and vomiting which she thinks this feels different.  No diarrhea no fevers.  Will obtain a laboratory evaluation and treat pain and nausea and reassess.  Patient's laboratory evaluation with no leukocytosis no anemia.  Potassium of 3.0 with a mild bump in her renal function.  Lipase is also mildly elevated.  Patient reassessed and feeling a bit better but still having some discomfort.  Typically this patient would get a CT scan but is not available here today.  I discussed the case with Dr. Almyra Free, accepts the patient in ED to ED transfer.  UA has resulted and is nitrite positive with too numerous to count bacteria.  There is no whites and there she has no urinary symptoms.  Not sure of the clinical significance of this finding.  We will send off a urine culture.  The patients results and plan were reviewed and discussed.   Any x-rays performed were independently reviewed by myself.   Differential diagnosis were considered with the presenting HPI.  Medications  potassium chloride 10 mEq in 100 mL IVPB (has no administration in time range)  potassium chloride SA (KLOR-CON M) CR tablet 40 mEq (has no administration in time range)  magnesium oxide (MAG-OX) tablet 800 mg (has no administration in time range)  morphine (PF) 4 MG/ML injection 4 mg (has no administration in time range)  ondansetron (ZOFRAN) injection 4 mg (has no administration in time range)  sodium chloride 0.9 % bolus 1,000 mL (1,000 mLs Intravenous New Bag/Given 03/08/22 1254)  morphine (PF) 4 MG/ML injection 4 mg (4 mg Intravenous  Given 03/08/22 1256)  ondansetron (ZOFRAN) injection 4 mg (4  mg Intravenous Given 03/08/22 1256)    Vitals:   03/08/22 1216 03/08/22 1237  BP: (!) 91/59 121/67  Pulse: 91   Resp: (!) 22 (!) 22  Temp: 98 F (36.7 C)   TempSrc: Oral   SpO2: 99%     Final diagnoses:  Epigastric pain            Final Clinical Impression(s) / ED Diagnoses Final diagnoses:  Epigastric pain    Rx / DC Orders ED Discharge Orders     None         Deno Etienne, DO 03/08/22 [place], [place], DO 03/08/22 1417

## 2022-03-08 NOTE — ED Notes (Signed)
Patient given two cups of water for fluid challenge

## 2022-03-09 LAB — POC OCCULT BLOOD, ED: Fecal Occult Bld: NEGATIVE

## 2022-03-11 LAB — URINE CULTURE: Culture: >=100000 — AB

## 2022-03-12 ENCOUNTER — Telehealth: Payer: Self-pay

## 2022-03-12 ENCOUNTER — Other Ambulatory Visit: Payer: Self-pay | Admitting: Nurse Practitioner

## 2022-03-12 DIAGNOSIS — E039 Hypothyroidism, unspecified: Secondary | ICD-10-CM

## 2022-03-12 NOTE — Progress Notes (Signed)
ED Antimicrobial Stewardship Positive Culture Follow Up   Megan Brewer is an 76 y.o. female who presented to [facility] on 03/08/2022 with a chief complaint of  Chief Complaint  Patient presents with   Abdominal Pain    Recent Results (from the past 720 hour(s))  Urine Culture     Status: Abnormal   Collection Time: 03/08/22  3:29 PM   Specimen: Urine, Clean Catch  Result Value Ref Range Status   Specimen Description   Final    URINE, CLEAN CATCH Performed at [facility], [address] [place], [place] [address]    Special Requests   Final    NONE Performed at [facility], [address] [place], [place] [address]    Culture (A)  Final    >=100,000 COLONIES/mL ESCHERICHIA COLI >=100,000 COLONIES/mL KLEBSIELLA OXYTOCA    Report Status 03/11/2022 FINAL  Final   Organism ID, Bacteria ESCHERICHIA COLI (A)  Final   Organism ID, Bacteria KLEBSIELLA OXYTOCA (A)  Final      Susceptibility   Escherichia coli - MIC*    AMPICILLIN 8 SENSITIVE Sensitive     CEFAZOLIN <=4 SENSITIVE Sensitive     CEFEPIME <=0.12 SENSITIVE Sensitive     CEFTRIAXONE <=0.25 SENSITIVE Sensitive     CIPROFLOXACIN <=0.25 SENSITIVE Sensitive     GENTAMICIN <=1 SENSITIVE Sensitive     IMIPENEM <=0.25 SENSITIVE Sensitive     NITROFURANTOIN <=16 SENSITIVE Sensitive     TRIMETH/SULFA <=20 SENSITIVE Sensitive     AMPICILLIN/SULBACTAM 4 SENSITIVE Sensitive     PIP/TAZO <=4 SENSITIVE Sensitive     * >=100,000 COLONIES/mL ESCHERICHIA COLI   Klebsiella oxytoca - MIC*    AMPICILLIN >=32 RESISTANT Resistant     CEFAZOLIN 8 SENSITIVE Sensitive     CEFEPIME <=0.12 SENSITIVE Sensitive     CEFTRIAXONE <=0.25 SENSITIVE Sensitive     CIPROFLOXACIN <=0.25 SENSITIVE Sensitive     GENTAMICIN <=1 SENSITIVE Sensitive     IMIPENEM <=0.25 SENSITIVE Sensitive     NITROFURANTOIN <=16 SENSITIVE Sensitive     TRIMETH/SULFA <=20 SENSITIVE Sensitive     AMPICILLIN/SULBACTAM 8 SENSITIVE Sensitive      PIP/TAZO <=4 SENSITIVE Sensitive     * >=100,000 COLONIES/mL KLEBSIELLA OXYTOCA    Patient appeared in ED on 7/23 for CT scan after transfer from another facility. Presented w/ c/o LUQ abdominal pain; patient has chronic abdomen pain but said this "feels different". Patient also had emesis w/ black specks in it. She was afebrile and had no urinary complaints. Has a hx of gastric bypass. Fecal occult blood was negative and patient was discharged after a dose of Rocephin. An abx prescription was not provided.   UA showed the following: Nitrite positive, leuko negative, not strongly indicative of UTI. CBC was WNL. Ucx grew >= 100,000 colonies of E. Coli and Klebsiella oxytoca.   Spoke to provider and it was agreed that d/t patient's lack of urinary symptoms, UA not strongly indicative of infxn, and normal CBC that a symptom check should be performed prior to any abx use.  Plan: Call patient for symptom check, if symptomatic prescribe cephalexin 500 mg BID x 7 days.  Note: Patient does not have a true cephalosporin allergy, headache is the only listed rxn to cefixime.    ED Provider: Carlisle Cater   Barnet Pall 03/12/2022, 8:40 AM PharmD Candidate Monday - Friday phone -  [phone] Saturday - Sunday phone - [phone]

## 2022-03-12 NOTE — Telephone Encounter (Signed)
Post ED Visit - Positive Culture Follow-up: Successful Patient Follow-Up  Culture assessed and recommendations reviewed by:  []  Elenor Quinones, Pharm.D. []  Heide Guile, Pharm.D., BCPS AQ-ID []  Parks Neptune, Pharm.D., BCPS [x]  Alycia Rossetti, Pharm.D., BCPS []  [place], [place].D., BCPS, AAHIVP []  Legrand Como, Pharm.D., BCPS, AAHIVP []  Salome Arnt, PharmD, BCPS []  Johnnette Gourd, PharmD, BCPS []  Hughes Better, PharmD, BCPS []  Leeroy Cha, PharmD  Positive urine culture  Plan: Call pt for symptoms check. If resolved/improved - no treatment needed. If still symptomatic - start Keflex 500 mg po BID x 7 days per ED provider Carlisle Cater, PA-C.   Spoke with pt, pt states she is not having any more symptoms. Also denies any s/s of UTI  [x]  Patient discharged without antimicrobial prescription and treatment may be indicated []  Organism is resistant to prescribed ED discharge antimicrobial []  Patient with positive blood cultures  Changes discussed with ED provider: Carlisle Cater, PA-C New antibiotic prescription NONE   Contacted patient, date 03/12/2022, time 0915   Glennon Hamilton 03/12/2022, 9:18 AM

## 2022-03-17 DIAGNOSIS — M353 Polymyalgia rheumatica: Secondary | ICD-10-CM

## 2022-03-17 HISTORY — DX: Polymyalgia rheumatica: M35.3

## 2022-03-18 ENCOUNTER — Other Ambulatory Visit: Payer: Medicare PPO

## 2022-03-21 DIAGNOSIS — I959 Hypotension, unspecified: Secondary | ICD-10-CM | POA: Diagnosis not present

## 2022-03-21 DIAGNOSIS — M130 Polyarthritis, unspecified: Secondary | ICD-10-CM | POA: Diagnosis not present

## 2022-03-21 DIAGNOSIS — M2550 Pain in unspecified joint: Secondary | ICD-10-CM | POA: Diagnosis not present

## 2022-03-21 DIAGNOSIS — R531 Weakness: Secondary | ICD-10-CM | POA: Diagnosis not present

## 2022-03-21 DIAGNOSIS — N183 Chronic kidney disease, stage 3 unspecified: Secondary | ICD-10-CM | POA: Diagnosis not present

## 2022-03-23 ENCOUNTER — Ambulatory Visit: Payer: Medicare PPO | Admitting: Adult Health

## 2022-03-23 ENCOUNTER — Encounter: Payer: Self-pay | Admitting: Adult Health

## 2022-03-23 VITALS — BP 120/70 | HR 83 | Temp 96.3°F | Resp 18 | Ht 64.0 in | Wt 140.0 lb

## 2022-03-23 DIAGNOSIS — M7918 Myalgia, other site: Secondary | ICD-10-CM | POA: Diagnosis not present

## 2022-03-23 DIAGNOSIS — D839 Common variable immunodeficiency, unspecified: Secondary | ICD-10-CM | POA: Diagnosis not present

## 2022-03-23 DIAGNOSIS — M353 Polymyalgia rheumatica: Secondary | ICD-10-CM

## 2022-03-23 DIAGNOSIS — N1832 Chronic kidney disease, stage 3b: Secondary | ICD-10-CM | POA: Diagnosis not present

## 2022-03-23 DIAGNOSIS — R110 Nausea: Secondary | ICD-10-CM | POA: Diagnosis not present

## 2022-03-23 NOTE — Progress Notes (Signed)
[facility] clinic  Provider:  Durenda Age DNP  Code Status:   Full Code  Goals of Care:     03/23/2022   10:59 AM  Advanced Directives  Does Patient Have a Medical Advance Directive? No  Would patient like information on creating a medical advance directive? No - Patient declined     Chief Complaint  Patient presents with   Acute Visit    Patient is her to discuss new diagnosis , patient with fainting symptoms while waiting. Patient recently released from hospital    HPI: Patient is a 76 y.o. female seen today for an acute visit for her new diagnosis of PMR. She was vacationing at [facility] but felt severe pain on her spine, neck, clavicles, shoulder blades so she went to [facility]. At the ED she was diagnosed with PMR and was given Prednisone tapering dose. She had taken 2 days of Prednisone and now on her 3/10 days of Prednisone. She is supposed to be on Prednisone 60 mg on 3rd to 4th day and 40 mg on 5th to 10th day. She was given another refill of this Prednisone taper. Patient is now in the clinic and wondering if she can ask for a refill of Prednisone when she runs out. She was seen today at [facility] for her CVID (Common variable immunodeficiency). She said that her regular doctor was not there due to being sick and was referred to another rheumatologist specializing on PMR. She will have a home health nurse who will access her porthacath and so she can have her medication for Immunoglobulin deficiency put in. She stated that she does not know if she can tolerate her Prednisone orally. She was wondering if she can have her Prednisone IV through her portacath.     Today, she was seen by the front desk "almost passing out" so she was brought to the room immediately. BP 120/70, PR 83, RR 18 and O2sat 98% on room air. She denies feeling of fainting. She stated that she has gone to another clinic already this morning and now here and  that she is tired already. She was diagnosed with immunodeficiency 30 years ago and feels fatigued. She said that she "can only stand for 2-3 mins and walks better than standing". She drinks 64 to 96 oz/day. She mentioned that she had gastric bypass 15 years ago.  Past Medical History:  Diagnosis Date   Anemia    Arachnoiditis    Bipolar disorder (HCC)    C. 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[place])    Lung cancer ([place]) 2011, 2014   Memory loss    Migraine    Opioid abuse ([place])    Osteoporosis    Presence of neurostimulator    Restless legs    SIRS (systemic inflammatory response syndrome) ([place])    Sleep apnea     Past Surgical History:  Procedure Laterality Date   ABDOMINAL HYSTERECTOMY     CARPAL TUNNEL RELEASE     CATARACT EXTRACTION Bilateral 2019   CHOLECYSTECTOMY     COLONOSCOPY  2015   [facility]   CT LUNG SCREENING  2018   DENTAL SURGERY  06/17/2021  4 teeth rmoved   DG  BONE DENSITY ([place] HX)  2018   DIAGNOSTIC MAMMOGRAM  2019   GASTRIC BYPASS     LUNG CANCER SURGERY  02/2010   MULTIPLE TOOTH EXTRACTIONS     PORT A CATH REVISION Right 04/2021   PORT-A-CATH REMOVAL Left 02/07/2021   Procedure: REMOVAL PORT-A-CATH;  Surgeon: Kieth Brightly Arta Bruce, MD;  Location: [facility];  Service: General;  Laterality: Left;  60 MINUTES ROOM 4   SPINAL CORD STIMULATOR IMPLANT      Allergies  Allergen Reactions   Imitrex [Sumatriptan] Nausea And Vomiting    Ringing in the ears, migraines are worse   Tetracycline Nausea Only    Causes ringing in ears, dizziness, migraine, nausea   Corticosteroids Other (See Comments)    12/02/2016 interview by KNL: Oral dosing causes migraines/nausea/tinnitus. Prev tolerated IV and intranasal admin  w/o difficulty.   Cefixime Other (See Comments)    Headache    Ciprofloxacin Other (See Comments)    Headache, dizziness, ringing in the ears   Doxycycline Other (See Comments)    unknown   Gabapentin     Throat swells/closes   Isometheptene-Dichloral-Apap Other (See Comments)    unknown   Ketorolac     12/02/2016 interview by ZJQ: Oral dosing prednisone/corticosteroids causes migraines/nausea/tinnitus. Prev tolerated IV and intranasal admin w/o difficulty.   Prednisone Other (See Comments)    Migraine, dizzy, ringing in ears-can take it IV 12/02/2016 interview by [name]: Oral prednisone dosing causes migraines/nausea/tinnitus. Prev tolerated IV and intranasal admin w/o difficulty.   Promethazine Other (See Comments)    causes severe abdominal pain when taken IV; however she can take PO or IM   Stadol [Butorphanol]     Cerebral pain   Tape Other (See Comments)    Adhesive tapes-patient denies   Erythromycin Base Diarrhea and Itching    Severe diarrhea   Propoxyphene Nausea Only    Outpatient Encounter Medications as of 03/23/2022  Medication Sig   acetaminophen (TYLENOL) 500 MG tablet Take 500 mg by mouth every 6 (six) hours as needed for moderate pain.   azelastine (ASTELIN) 0.1 % nasal spray Place 2 sprays into both nostrils 2 (two) times daily.   baclofen (LIORESAL) 10 MG tablet Take 10 mg by mouth daily as needed for muscle spasms.   buPROPion (WELLBUTRIN XL) 150 MG 24 hr tablet Take 1 tablet (150 mg total) by mouth daily.   CALCIUM PO Take 1 tablet by mouth in the morning and at bedtime.   carbidopa-levodopa (SINEMET IR) 25-100 MG tablet Take as needed for severe restless leg syndrome.   cholecalciferol (VITAMIN D3) 25 MCG (1000 UNIT) tablet Take 1,000 Units by mouth daily.   cycloSPORINE (RESTASIS) 0.05 % ophthalmic emulsion Place 1 drop into both eyes in the morning, at noon, in the evening, and at bedtime.   Dexamethasone (DECADRON PO) Take 1 tablet by mouth. Every 21 days    dexlansoprazole (DEXILANT) 60 MG capsule Take 1 capsule (60 mg total) by mouth daily. **PLEASE CONTACT OFFICE TO SCHEDULE APPOINTMENT   famotidine (PEPCID) 20 MG tablet One after supper   ferrous sulfate 325 (65 FE) MG tablet Take 650 mg by mouth daily with breakfast.   GAMMAGARD 20 GM/200ML SOLN Inject into the vein. Every 21 days   heparin lock flush 100 UNIT/ML SOLN injection Inject 500 Units into the vein every 21 ( twenty-one) days.   hydrOXYzine (VISTARIL) 25 MG capsule Take 25 mg by mouth at bedtime.   ibuprofen (  ADVIL) 200 MG tablet Take 600 mg by mouth as needed for mild pain.   immune globulin, human, (GAMMAGARD S/D LESS IGA) 10 g injection Inject 2 g/kg into the vein every 21 ( twenty-one) days.   levothyroxine (SYNTHROID) 75 MCG tablet TAKE ONE (1) TABLET BY MOUTH EACH DAY 30MINUTES BEFORE BREAKFAST ON EMPTY STOMACH   magnesium oxide (MAG-OX) 400 MG tablet Take 400 mg by mouth daily.    melatonin 5 MG TABS Take 5 mg by mouth.   mupirocin ointment (BACTROBAN) 2 % Apply 1 application. topically 2 (two) times daily.   oxybutynin (DITROPAN) 5 MG/5ML syrup TAKE 2.5 MLS BY MOUTH TWICE A DAY   potassium chloride (KLOR-CON) 10 MEQ tablet Take 10 mEq by mouth 4 (four) times daily.   rOPINIRole (REQUIP) 1 MG tablet Take 0.5 tablet at 2p and 1.5 tablet 2-3 hour prior to bedtime.   sodium chloride, PF, 0.9 % injection Inject 5-10 mLs into the vein as needed (Immunoglobulin).   tamsulosin (FLOMAX) 0.4 MG CAPS capsule Take 1 capsule (0.4 mg total) by mouth daily.   torsemide (DEMADEX) 20 MG tablet Take 20 mg by mouth daily.   triamcinolone cream (KENALOG) 0.1 % Apply 1 application topically 2 (two) times daily as needed (rash).   UNABLE TO FIND Take 1 tablet by mouth daily. Med Name: [name] 1 by mouth daily for UTI prevention   vitamin B-12 (CYANOCOBALAMIN) 1000 MCG tablet TAKE 1 TABLET BY MOUTH DAILY   ZINC OXIDE, TOPICAL, 10 % CREA Apply 1 application topically 2 (two) times daily as needed  (rash).   lamoTRIgine (LAMICTAL) 200 MG tablet Take 1 tablet (200 mg total) by mouth at bedtime.   mirtazapine (REMERON) 30 MG tablet Take 1 tablet (30 mg total) by mouth at bedtime.   No facility-administered encounter medications on file as of 03/23/2022.    Review of Systems:  Review of Systems  Constitutional:  Positive for appetite change, chills and fatigue. Negative for fever.  HENT:  Negative for congestion, hearing loss, rhinorrhea and sore throat.   Eyes: Negative.   Respiratory:  Negative for cough, shortness of breath and wheezing.   Cardiovascular:  Positive for leg swelling. Negative for chest pain and palpitations.  Gastrointestinal:  Negative for abdominal pain, constipation, diarrhea, nausea and vomiting.  Genitourinary:  Negative for dysuria.  Musculoskeletal:  Positive for arthralgias, gait problem and neck pain. Negative for back pain and myalgias.  Skin:  Negative for color change, rash and wound.  Neurological:  Negative for dizziness, weakness and headaches.  Psychiatric/Behavioral:  Negative for behavioral problems. The patient is not nervous/anxious.     Health Maintenance  Topic Date Due   INFLUENZA VACCINE  03/17/2022   MAMMOGRAM  08/16/2023   COLONOSCOPY (Pts 45-60yrs Insurance coverage will need to be confirmed)  01/01/2027   TETANUS/TDAP  05/30/2030   Pneumonia Vaccine 62+ Years old  Completed   DEXA SCAN  Completed   COVID-19 Vaccine  Completed   Hepatitis C Screening  Completed   Zoster Vaccines- Shingrix  Completed   HPV VACCINES  Aged Out    Physical Exam: Vitals:   03/23/22 1104  BP: 120/70  Pulse: 83  Resp: 18  Temp: (!) 96.3 F (35.7 C)  TempSrc: Temporal  SpO2: 98%  Weight: 140 lb (63.5 kg)  Height: $Remove'5\' 4"'cXeifxP$  (1.626 m)   Body mass index is 24.03 kg/m. Physical Exam Constitutional:      Appearance: Normal appearance.  HENT:     Head: Normocephalic  and atraumatic.     Nose: Nose normal.     Mouth/Throat:     Mouth: Mucous  membranes are moist.  Eyes:     Conjunctiva/sclera: Conjunctivae normal.  Cardiovascular:     Rate and Rhythm: Normal rate and regular rhythm.     Comments: Has portacath on her right chest which is not accessed. Pulmonary:     Effort: Pulmonary effort is normal.     Breath sounds: Normal breath sounds.  Abdominal:     General: Bowel sounds are normal.     Palpations: Abdomen is soft.  Musculoskeletal:        General: Swelling present. Normal range of motion.     Cervical back: Normal range of motion.     Right lower leg: Edema present.     Left lower leg: Edema present.     Comments: BLE 2+edema  Skin:    General: Skin is warm and dry.  Neurological:     General: No focal deficit present.     Mental Status: She is alert and oriented to person, place, and time.  Psychiatric:        Mood and Affect: Mood normal.        Behavior: Behavior normal.        Thought Content: Thought content normal.        Judgment: Judgment normal.     Labs reviewed: Basic Metabolic Panel: Recent Labs    06/26/21 1607 08/14/21 1059 10/09/21 1214 11/20/21 0956 03/08/22 1242  NA 139   < > 142 140 137  K 4.6   < > 3.9 4.2 3.0*  CL 104   < > 102 107* 102  CO2 23   < > 23 18* 25  GLUCOSE 100*   < > 99 88 127*  BUN 28*   < > 29* 17 33*  CREATININE 1.12   < > 1.48* 0.97 1.37*  CALCIUM 9.1   < > 9.8 8.2* 9.2  MG  --   --   --   --  1.8  TSH 4.42  --   --   --   --    < > = values in this interval not displayed.   Liver Function Tests: Recent Labs    04/03/21 1321 06/23/21 1056 03/08/22 1242  AST 62* 20 28  ALT $Re'30 14 25  'QRg$ ALKPHOS 93 73 81  BILITOT 0.2* 0.3 0.7  PROT 8.5* 7.1 7.5  ALBUMIN 4.0 3.7 3.5   Recent Labs    03/08/22 1242  LIPASE 52*   No results for input(s): "AMMONIA" in the last 8760 hours. CBC: Recent Labs    06/23/21 1056 06/26/21 1607 03/08/22 1242  WBC 8.5 7.6 6.9  NEUTROABS 6.4 5.1 4.8  HGB 14.1 14.0 13.5  HCT 41.7 42.0 39.8  MCV 87.8 88.7 87.5  PLT 219  245.0 189   Lipid Panel: No results for input(s): "CHOL", "HDL", "LDLCALC", "TRIG", "CHOLHDL", "LDLDIRECT" in the last 8760 hours. No results found for: "HGBA1C"  Procedures since last visit: CT Angio Neck W and/or Wo Contrast  Result Date: 03/08/2022 CLINICAL DATA:  Provided history: Dizziness, nonspecific. EXAM: CT ANGIOGRAPHY HEAD AND NECK TECHNIQUE: Multidetector CT imaging of the head and neck was performed using the standard protocol during bolus administration of intravenous contrast. Multiplanar CT image reconstructions and MIPs were obtained to evaluate the vascular anatomy. Carotid stenosis measurements (when applicable) are obtained utilizing NASCET criteria, using the distal internal carotid diameter as the denominator. RADIATION  DOSE REDUCTION: This exam was performed according to the departmental dose-optimization program which includes automated exposure control, adjustment of the mA and/or kV according to patient size and/or use of iterative reconstruction technique. CONTRAST:  9mL OMNIPAQUE IOHEXOL 350 MG/ML SOLN COMPARISON:  Head CT 01/15/2021. FINDINGS: CT HEAD FINDINGS Brain: Mild generalized cerebral atrophy. There is no acute intracranial hemorrhage. No demarcated cortical infarct. No extra-axial fluid collection. No evidence of an intracranial mass. No midline shift. Vascular: No hyperdense vessel.  Atherosclerotic calcifications. Skull: No fracture or aggressive osseous lesion. Orbits: No mass or acute finding. Sinuses: Postsurgical appearance of the paranasal sinuses. Minimal mucosal thickening within the bilateral ethmoidectomy cavities. Near complete opacification of the left sphenoid sinus due to the presence of mucosal thickening and secretions (with associated chronic reactive osteitis). Mucosal thickening within the bilateral maxillary sinuses (moderate right, mild left) with associated chronic reactive osteitis. Review of the MIP images confirms the above findings CTA NECK  FINDINGS Aortic arch: Common origin of the innominate and atherosclerotic plaque within the visualized aortic arch and proximal major branch vessels of the neck. No hemodynamically significant innominate or proximal subclavian artery stenosis. Left common carotid arteries. Right carotid system: CCA and ICA patent within the neck without stenosis or significant atherosclerotic disease. Left carotid system: CCA and ICA patent within the neck without stenosis. Minimal atherosclerotic plaque about the CCA origin. Vertebral arteries: Vertebral arteries patent within the neck without stenosis. The left vertebral artery is dominant. Skeleton: Cervical spondylosis. No acute fracture or aggressive osseous lesion Other neck: No neck mass or cervical lymphadenopathy. Upper chest: No consolidation within the imaged lung apices. Review of the MIP images confirms the above findings CTA HEAD FINDINGS Anterior circulation: The intracranial internal carotid arteries are patent. Nonstenotic atherosclerotic plaque within both vessels. The M1 middle cerebral arteries are patent. No M2 proximal branch occlusion or high-grade proximal stenosis. The anterior cerebral arteries are patent. No intracranial aneurysm is identified. Posterior circulation: The intracranial right vertebral artery is developmentally diminutive beyond the origin of the right PICA, but patent. The dominant intracranial left vertebral artery is patent without stenosis. The basilar artery is patent. The posterior cerebral arteries are patent. Posterior communicating arteries are present bilaterally. Venous sinuses: Within the limitations of contrast timing, no convincing thrombus. Anatomic variants: None significant. Review of the MIP images confirms the above findings IMPRESSION: CT head: 1. No evidence of acute intracranial abnormality. 2. Mild generalized cerebral atrophy. 3. Paranasal sinus disease, as described. CTA neck: 1. The common carotid, internal carotid  and vertebral arteries are patent within the neck without stenosis. Mild atherosclerotic plaque at the origin of the left common carotid artery. 2.  Aortic Atherosclerosis (ICD10-I70.0). CTA head: 1. No intracranial large vessel occlusion or proximal high-grade arterial stenosis. 2. Non-stenotic atherosclerotic plaque within the bilateral intracranial ICAs. Electronically Signed   By: Kellie Simmering D.O.   On: 03/08/2022 16:52   CT Angio Head W or Wo Contrast  Result Date: 03/08/2022 CLINICAL DATA:  Provided history: Dizziness, nonspecific. EXAM: CT ANGIOGRAPHY HEAD AND NECK TECHNIQUE: Multidetector CT imaging of the head and neck was performed using the standard protocol during bolus administration of intravenous contrast. Multiplanar CT image reconstructions and MIPs were obtained to evaluate the vascular anatomy. Carotid stenosis measurements (when applicable) are obtained utilizing NASCET criteria, using the distal internal carotid diameter as the denominator. RADIATION DOSE REDUCTION: This exam was performed according to the departmental dose-optimization program which includes automated exposure control, adjustment of the mA and/or kV according to  patient size and/or use of iterative reconstruction technique. CONTRAST:  54mL OMNIPAQUE IOHEXOL 350 MG/ML SOLN COMPARISON:  Head CT 01/15/2021. FINDINGS: CT HEAD FINDINGS Brain: Mild generalized cerebral atrophy. There is no acute intracranial hemorrhage. No demarcated cortical infarct. No extra-axial fluid collection. No evidence of an intracranial mass. No midline shift. Vascular: No hyperdense vessel.  Atherosclerotic calcifications. Skull: No fracture or aggressive osseous lesion. Orbits: No mass or acute finding. Sinuses: Postsurgical appearance of the paranasal sinuses. Minimal mucosal thickening within the bilateral ethmoidectomy cavities. Near complete opacification of the left sphenoid sinus due to the presence of mucosal thickening and secretions (with  associated chronic reactive osteitis). Mucosal thickening within the bilateral maxillary sinuses (moderate right, mild left) with associated chronic reactive osteitis. Review of the MIP images confirms the above findings CTA NECK FINDINGS Aortic arch: Common origin of the innominate and atherosclerotic plaque within the visualized aortic arch and proximal major branch vessels of the neck. No hemodynamically significant innominate or proximal subclavian artery stenosis. Left common carotid arteries. Right carotid system: CCA and ICA patent within the neck without stenosis or significant atherosclerotic disease. Left carotid system: CCA and ICA patent within the neck without stenosis. Minimal atherosclerotic plaque about the CCA origin. Vertebral arteries: Vertebral arteries patent within the neck without stenosis. The left vertebral artery is dominant. Skeleton: Cervical spondylosis. No acute fracture or aggressive osseous lesion Other neck: No neck mass or cervical lymphadenopathy. Upper chest: No consolidation within the imaged lung apices. Review of the MIP images confirms the above findings CTA HEAD FINDINGS Anterior circulation: The intracranial internal carotid arteries are patent. Nonstenotic atherosclerotic plaque within both vessels. The M1 middle cerebral arteries are patent. No M2 proximal branch occlusion or high-grade proximal stenosis. The anterior cerebral arteries are patent. No intracranial aneurysm is identified. Posterior circulation: The intracranial right vertebral artery is developmentally diminutive beyond the origin of the right PICA, but patent. The dominant intracranial left vertebral artery is patent without stenosis. The basilar artery is patent. The posterior cerebral arteries are patent. Posterior communicating arteries are present bilaterally. Venous sinuses: Within the limitations of contrast timing, no convincing thrombus. Anatomic variants: None significant. Review of the MIP images  confirms the above findings IMPRESSION: CT head: 1. No evidence of acute intracranial abnormality. 2. Mild generalized cerebral atrophy. 3. Paranasal sinus disease, as described. CTA neck: 1. The common carotid, internal carotid and vertebral arteries are patent within the neck without stenosis. Mild atherosclerotic plaque at the origin of the left common carotid artery. 2.  Aortic Atherosclerosis (ICD10-I70.0). CTA head: 1. No intracranial large vessel occlusion or proximal high-grade arterial stenosis. 2. Non-stenotic atherosclerotic plaque within the bilateral intracranial ICAs. Electronically Signed   By: Kellie Simmering D.O.   On: 03/08/2022 16:52   CT ABDOMEN PELVIS W CONTRAST  Result Date: 03/08/2022 CLINICAL DATA:  Right lower quadrant abdominal pain. Vomiting. History of gastric bypass. EXAM: CT ABDOMEN AND PELVIS WITH CONTRAST TECHNIQUE: Multidetector CT imaging of the abdomen and pelvis was performed using the standard protocol following bolus administration of intravenous contrast. RADIATION DOSE REDUCTION: This exam was performed according to the departmental dose-optimization program which includes automated exposure control, adjustment of the mA and/or kV according to patient size and/or use of iterative reconstruction technique. CONTRAST:  35mL OMNIPAQUE IOHEXOL 350 MG/ML SOLN COMPARISON:  CT examination dated Jan 14, 2021 FINDINGS: Lower chest: Right lower lobe pleural thickening and pleural calcifications, unchanged. Hepatobiliary: No focal liver abnormality is seen. Status post cholecystectomy. Prominence of the extrahepatic common duct  and mildly dilated intrahepatic bile ducts, likely secondary to cholecystectomy changes and reservoir effect. No appreciable biliary duct calculus. Pancreas: Mild generalized pancreatic atrophy. No pancreatic ductal dilatation or surrounding inflammatory changes. Spleen: Normal in size without focal abnormality. Adrenals/Urinary Tract: Adrenal glands are  unremarkable. Kidneys are normal, without renal calculi, focal lesion, or hydronephrosis. Bladder is unremarkable. Stomach/Bowel: Postsurgical changes for prior gastric bypass surgery. Bowel loops are normal in caliber. There is also surgical material in the distal ileum in the right lower quadrant without evidence of bowel obstruction. Appendix not identified. No evidence of bowel wall thickening, distention, or inflammatory changes. Vascular/Lymphatic: Mild aortic atherosclerosis. No enlarged abdominal or pelvic lymph nodes. Reproductive: Status post hysterectomy. No adnexal masses. Other: No abdominal wall hernia or abnormality. No abdominopelvic ascites. Musculoskeletal: Multilevel degenerative disease of the lumbar spine prominent at L4-L5 and L5-S1. Spinal stimulator is noted. IMPRESSION: 1. Status post cholecystectomy with prominent extrahepatic common duct, likely secondary to reservoir effect. No appreciable biliary duct calculus. 2. Bowel loops are normal in caliber. No evidence of colitis or diverticulitis. 3.  No evidence of nephrolithiasis or hydronephrosis. 4.  Postsurgical changes for prior gastric bypass surgery. 5. Additional chronic findings as above. No CT evidence of acute abdominal/pelvic process. Electronically Signed   By: Keane Police D.O.   On: 03/08/2022 16:31    Assessment/Plan  1. PMR (polymyalgia rheumatica) (HCC) -  continue Prednisone taper as ordered -  follow up with Rheumatology -  was not sure if she needs to continue Prednisone at 40 mg daily?  2. Stage 3b chronic kidney disease ([place]) Lab Results  Component Value Date   NA 137 03/08/2022   K 3.0 (L) 03/08/2022   CO2 25 03/08/2022   GLUCOSE 127 (H) 03/08/2022   BUN 33 (H) 03/08/2022   CREATININE 1.37 (H) 03/08/2022   CALCIUM 9.2 03/08/2022   EGFR 62 11/20/2021   GFRNONAA 41 (L) 03/08/2022   -  stable    Labs/tests ordered:  None  Next appt:  04/27/2022

## 2022-03-23 NOTE — Patient Instructions (Signed)
Polymyalgia Rheumatica Polymyalgia rheumatica (PMR) is an inflammatory disorder that causes the muscles and joints to ache and become stiff. Sometimes, PMR leads to a more dangerous condition that can cause vision loss (temporal arteritis or giant cell arteritis). What are the causes? The exact cause of PMR is not known. What increases the risk? You are more likely to develop this condition if you are: Female. 74 years old or older. Of [place] European descent. What are the signs or symptoms? Pain and stiffness are the main symptoms of PMR and affect both sides of the body. These symptoms: May be worse after inactivity and in the morning. May affect your: Hips, buttocks, and thighs. Neck, arms, and shoulders. This can make it hard to raise your arms above your head. Hands and wrists. Other symptoms include: Fever. Tiredness. Weakness. Depression. Decreased appetite. This may lead to weight loss. Symptoms could start slowly but often come on suddenly, sometimes even overnight, or over a few days. How is this diagnosed? This condition is diagnosed with your medical history and a physical exam. You may need to see a health care provider who specializes in diseases of the joints, muscles, and bones (rheumatologist). You may also have tests, including: Blood tests. Often, test results that show inflammation are very elevated. Imaging studies like X-rays or ultrasound, which may be done to rule out other conditions. These are often usual in PMR. How is this treated? PMR usually goes away without treatment, but it may take years. PMR often responds rapidly (within a few days) to low-dose glucocorticoids (steroids). These medicines have serious side effects. Once your symptoms are better controlled, the dose should be lowered to find the lowest possible dose that controls your symptoms. Regular exercise and rest will also help your symptoms. Follow these instructions at home:  Take  over-the-counter and prescription medicines only as told by your health care provider. Make sure to get enough rest and sleep. Eat a healthy and nutritious diet that includes calcium and vitamin D. Calcium is important for bone health, and vitamin D helps your body absorb calcium. Good sources of calcium and vitamin D include: Certain fatty fish, such as salmon and tuna. Products that have calcium and vitamin D added to them (are fortified), such as fortified cereals. Collard greens, turnip greens, broccoli, and kale. Egg yolks. Cheese. Liver. Try to exercise most days of the week. Ask your health care provider what type of exercises are best for you. Keep all follow-up visits. This is important. Contact a health care provider if: Your symptoms do not improve with medicine. You have side effects from steroids. These may include: Weight gain. Swelling. Insomnia. Mood changes. Bruising. High blood sugar readings, if you have diabetes. Higher than normal blood pressure readings, if you monitor your blood pressure. Get help right away if: You develop symptoms of temporal arteritis, such as: A change in vision. Severe headache. Scalp pain. Jaw pain. These symptoms may represent a serious problem that is an emergency. Do not wait to see if the symptoms will go away. Get medical help right away. Call your local emergency services (911 in the [place] Do not drive yourself to the hospital. Summary Polymyalgia rheumatica is an inflammatory disorder that causes aching and stiffness in your muscles and joints. The exact cause of this condition is not known. This condition usually goes away without treatment. Your health care provider may give you low-dose glucocorticoids (steroids) to help manage your pain and stiffness. Rest and regular exercise will help  the symptoms. This information is not intended to replace advice given to you by your health care provider. Make sure you discuss any  questions you have with your health care provider. Document Revised: 12/05/2020 Document Reviewed: 12/05/2020 Elsevier Patient Education  [place].

## 2022-03-24 ENCOUNTER — Other Ambulatory Visit: Payer: Self-pay

## 2022-03-24 ENCOUNTER — Encounter (HOSPITAL_BASED_OUTPATIENT_CLINIC_OR_DEPARTMENT_OTHER): Payer: Self-pay | Admitting: Pediatrics

## 2022-03-24 ENCOUNTER — Emergency Department (HOSPITAL_BASED_OUTPATIENT_CLINIC_OR_DEPARTMENT_OTHER): Payer: Medicare PPO

## 2022-03-24 ENCOUNTER — Emergency Department (HOSPITAL_BASED_OUTPATIENT_CLINIC_OR_DEPARTMENT_OTHER)
Admission: EM | Admit: 2022-03-24 | Discharge: 2022-03-24 | Disposition: A | Discharge status: Home / Self Care | Payer: Medicare PPO | Attending: Emergency Medicine | Admitting: Emergency Medicine

## 2022-03-24 DIAGNOSIS — M791 Myalgia, unspecified site: Secondary | ICD-10-CM | POA: Diagnosis not present

## 2022-03-24 DIAGNOSIS — H9313 Tinnitus, bilateral: Secondary | ICD-10-CM | POA: Diagnosis not present

## 2022-03-24 DIAGNOSIS — R0789 Other chest pain: Secondary | ICD-10-CM | POA: Diagnosis not present

## 2022-03-24 DIAGNOSIS — R42 Dizziness and giddiness: Secondary | ICD-10-CM | POA: Diagnosis not present

## 2022-03-24 DIAGNOSIS — R519 Headache, unspecified: Secondary | ICD-10-CM | POA: Diagnosis not present

## 2022-03-24 DIAGNOSIS — Z85118 Personal history of other malignant neoplasm of bronchus and lung: Secondary | ICD-10-CM | POA: Diagnosis not present

## 2022-03-24 DIAGNOSIS — R079 Chest pain, unspecified: Secondary | ICD-10-CM | POA: Insufficient documentation

## 2022-03-24 DIAGNOSIS — R52 Pain, unspecified: Secondary | ICD-10-CM

## 2022-03-24 LAB — CBC
HCT: 36.8 % (ref 36.0–46.0)
Hemoglobin: 12.5 g/dL (ref 12.0–15.0)
MCH: 29.9 pg (ref 26.0–34.0)
MCHC: 34 g/dL (ref 30.0–36.0)
MCV: 88 fL (ref 80.0–100.0)
Platelets: 190 10*3/uL (ref 150–400)
RBC: 4.18 MIL/uL (ref 3.87–5.11)
RDW: 12.8 % (ref 11.5–15.5)
WBC: 7.8 10*3/uL (ref 4.0–10.5)
nRBC: 0 % (ref 0.0–0.2)

## 2022-03-24 LAB — TROPONIN I (HIGH SENSITIVITY)
Troponin I (High Sensitivity): 7 ng/L (ref <18)
Troponin I (High Sensitivity): 7 ng/L (ref <18)

## 2022-03-24 LAB — BASIC METABOLIC PANEL
Anion gap: 7 (ref 5–15)
BUN: 32 mg/dL — ABNORMAL HIGH (ref 8–23)
CO2: 27 mmol/L (ref 22–32)
Calcium: 9.4 mg/dL (ref 8.9–10.3)
Chloride: 106 mmol/L (ref 98–111)
Creatinine, Ser: 1.52 mg/dL — ABNORMAL HIGH (ref 0.44–1.00)
GFR, Estimated: 36 mL/min — ABNORMAL LOW (ref >60)
Glucose, Bld: 100 mg/dL — ABNORMAL HIGH (ref 70–99)
Potassium: 3.1 mmol/L — ABNORMAL LOW (ref 3.5–5.1)
Sodium: 140 mmol/L (ref 135–145)

## 2022-03-24 MED ORDER — POTASSIUM CHLORIDE CRYS ER 20 MEQ PO TBCR
40.0000 meq | EXTENDED_RELEASE_TABLET | Freq: Once | ORAL | Status: AC
  Administered 2022-03-24: 40 meq via ORAL
  Filled 2022-03-24: qty 2

## 2022-03-24 MED ORDER — HEPARIN SOD (PORK) LOCK FLUSH 100 UNIT/ML IV SOLN
500.0000 [IU] | Freq: Once | INTRAVENOUS | Status: AC
  Administered 2022-03-24: 500 [IU]
  Filled 2022-03-24: qty 5

## 2022-03-24 MED ORDER — SODIUM CHLORIDE 0.9 % IV BOLUS
500.0000 mL | Freq: Once | INTRAVENOUS | Status: AC
  Administered 2022-03-24: 500 mL via INTRAVENOUS

## 2022-03-24 MED ORDER — MORPHINE SULFATE (PF) 4 MG/ML IV SOLN
4.0000 mg | Freq: Once | INTRAVENOUS | Status: AC
  Administered 2022-03-24: 4 mg via INTRAVENOUS
  Filled 2022-03-24: qty 1

## 2022-03-24 NOTE — Discharge Instructions (Signed)
Please use Tylenol or ibuprofen for pain.  You may use 600 mg ibuprofen every 6 hours or 1000 mg of Tylenol every 6 hours.  You may choose to alternate between the 2.  This would be most effective.  Not to exceed 4 g of Tylenol within 24 hours.  Not to exceed 3200 mg ibuprofen 24 hours.  Please follow-up with your primary care doctor, and attempt to establish a more formal diagnosis for polymyalgia rheumatica.  If the steroids are giving you bad side effects I recommend that you discontinue them at this time until you can speak to your primary care or rheumatologist.

## 2022-03-24 NOTE — ED Provider Notes (Signed)
I provided a substantive portion of the care of this patient.  I personally performed the entirety of the history, exam, and medical decision making for this encounter.  EKG Interpretation  Date/Time:  Tuesday March 24 2022 14:08:54 EDT Ventricular Rate:  89 PR Interval:  153 QRS Duration: 137 QT Interval:  359 QTC Calculation: 437 R Axis:   49 Text Interpretation: Sinus rhythm Left bundle branch block No significant change since last tracing Confirmed by Deno Etienne [phone]) on 03/24/2022 2:12:37 PM   Patient seen by me along with physician assistant.  Patient with new doses of PMR.  Started on steroids on Sunday.  Saw her primary care doctor and saw allergist yesterday.  Patient states that oral prednisone always makes her feel really bad.  This is happened in the past.  She cannot tolerate it.  Patient did say though that the pain from the PMR is improved.  Patient's past medical history also somewhat complicated by immunoglobulin subclass deficiency history of migraines memory loss restless leg sleep apnea hypothyroid hypoagammaglobulinemia chronic kidney disorder bipolar disorder history of lung cancer history of opioid abuse and C. difficile diarrhea requiring admission in the past.  Past surgical history includes gastric bypass surgery cholecystectomy abdominal hysterectomy lung cancer surgery in 2011.  Patient states that she had symptoms for several months before at [facility] where they came up with a concern for PMR.  Certainly steroids makes sense for that.  But the patient cannot tolerate steroids for several you may want to stop eventually will help the pain.  Patient has history of substance abuse.  So we will leave pain medication switch up to her primary care doctor.  Has an appointment with rheumatology but apparently at 6 weeks out.  Most important is that the patient states that when she is on oral prednisone the current symptoms that she is having with a headache dizziness ringing  or ears pounding in the chest all has occurred with that.  So she has no doubt that that is the cause from the prednisone.  Also messes up her got.  Stated patient has a decision whether she wants to stop the prednisone or not that is first-line treatment for the PMR.  Otherwise she can talk to her primary care doctor.  Need a primary care doctor can talk to the rheumatologist that she has follow-up with to see if they have any thoughts on what to do in the meantime.  Patient's labs here today without significant abnormalities other than potassium being down a little bit at 3.1.  Patient definitely does have a chronic kidney disease GFR is 36 troponin normal.  Chest x-ray without any acute findings.  On exam patient does have a Port-A-Cath in the right anterior chest.  Patient's vital signs no fever blood pressure normal heart rate 76.  Lungs are clear heart regular rate rhythm.   Fredia Sorrow, MD 03/24/22 [phone]

## 2022-03-24 NOTE — ED Provider Notes (Signed)
[facility] Provider Note   CSN: [id number] Arrival date & time: 03/24/22  1354     History  Chief Complaint  Patient presents with   Headache    ringing   Dizziness   Chest Pain    Shaquetta Arcos is a 76 y.o. female with past medical history significant for immunoglobulin deficiency, PE, acid reflux, history of opioid abuse, lung cancer, bipolar disorder who presents with concern for headache, dizziness, ringing in ears, pounding in chest that began yesterday, is worsened today after beginning high-dose steroids 60 mg prednisone for polymyalgia rheumatica on Sunday.  Patient reports she initially was feeling improved but has since had significant pain in the neck, upper back, shoulders, and chest.  She reports no exertional component of chest pain.  She denies significant shortness of breath.  She denies fever, chills, nausea, vomiting.   Headache Associated symptoms: dizziness   Dizziness Associated symptoms: chest pain and headaches   Chest Pain Associated symptoms: dizziness and headache        Home Medications Prior to Admission medications   Medication Sig Start Date End Date Taking? Authorizing Provider  acetaminophen (TYLENOL) 500 MG tablet Take 500 mg by mouth every 6 (six) hours as needed for moderate pain.    [provider]  azelastine (ASTELIN) 0.1 % nasal spray Place 2 sprays into both nostrils 2 (two) times daily. 03/10/21   Dara Hoyer, FNP  baclofen (LIORESAL) 10 MG tablet Take 10 mg by mouth daily as needed for muscle spasms. 06/03/20   [provider]  buPROPion (WELLBUTRIN XL) 150 MG 24 hr tablet Take 1 tablet (150 mg total) by mouth daily. 10/10/21   Thayer Headings, PMHNP  CALCIUM PO Take 1 tablet by mouth in the morning and at bedtime.    [provider]  carbidopa-levodopa (SINEMET IR) 25-100 MG tablet Take as needed for severe restless leg syndrome. 03/06/21   Narda Amber K, DO   cholecalciferol (VITAMIN D3) 25 MCG (1000 UNIT) tablet Take 1,000 Units by mouth daily.    [provider]  cycloSPORINE (RESTASIS) 0.05 % ophthalmic emulsion Place 1 drop into both eyes in the morning, at noon, in the evening, and at bedtime.    [provider]  Dexamethasone (DECADRON PO) Take 1 tablet by mouth. Every 21 days    [provider]  dexlansoprazole (DEXILANT) 60 MG capsule Take 1 capsule (60 mg total) by mouth daily. **PLEASE CONTACT OFFICE TO SCHEDULE APPOINTMENT 03/02/22   Esterwood, Amy S, PA-C  famotidine (PEPCID) 20 MG tablet One after supper 06/26/21   Tanda Rockers, MD  ferrous sulfate 325 (65 FE) MG tablet Take 650 mg by mouth daily with breakfast.    [provider]  GAMMAGARD 20 GM/200ML SOLN Inject into the vein. Every 21 days 02/27/22   [provider]  heparin lock flush 100 UNIT/ML SOLN injection Inject 500 Units into the vein every 21 ( twenty-one) days. 03/31/21   [provider]  hydrOXYzine (VISTARIL) 25 MG capsule Take 25 mg by mouth at bedtime. 03/07/22   [provider]  ibuprofen (ADVIL) 200 MG tablet Take 600 mg by mouth as needed for mild pain.    [provider]  immune globulin, human, (GAMMAGARD S/D LESS IGA) 10 g injection Inject 2 g/kg into the vein every 21 ( twenty-one) days.    [provider]  lamoTRIgine (LAMICTAL) 200 MG tablet Take 1 tablet (200 mg total) by mouth at  bedtime. 10/10/21 03/08/22  Thayer Headings, PMHNP  levothyroxine (SYNTHROID) 75 MCG tablet TAKE ONE (1) TABLET BY MOUTH EACH DAY 30MINUTES BEFORE BREAKFAST ON EMPTY STOMACH 03/12/22   [name], NP  magnesium oxide (MAG-OX) 400 MG tablet Take 400 mg by mouth daily.     [provider]  melatonin 5 MG TABS Take 5 mg by mouth.    [provider]  mirtazapine (REMERON) 30 MG tablet Take 1 tablet (30 mg total) by mouth at bedtime. 08/28/21 03/08/22  Thayer Headings, PMHNP  mupirocin  ointment (BACTROBAN) 2 % Apply 1 application. topically 2 (two) times daily. 12/29/21   [name], NP  oxybutynin (DITROPAN) 5 MG/5ML syrup TAKE 2.5 MLS BY MOUTH TWICE A DAY 09/09/21   [name], NP  potassium chloride (KLOR-CON) 10 MEQ tablet Take 10 mEq by mouth 4 (four) times daily. 02/19/22   [provider]  rOPINIRole (REQUIP) 1 MG tablet Take 0.5 tablet at 2p and 1.5 tablet 2-3 hour prior to bedtime. 11/14/21   Narda Amber K, DO  sodium chloride, PF, 0.9 % injection Inject 5-10 mLs into the vein as needed (Immunoglobulin). 02/12/21   [provider]  tamsulosin (FLOMAX) 0.4 MG CAPS capsule Take 1 capsule (0.4 mg total) by mouth daily. 01/23/21   Shelly Coss, MD  torsemide (DEMADEX) 20 MG tablet Take 20 mg by mouth daily.    [provider]  triamcinolone cream (KENALOG) 0.1 % Apply 1 application topically 2 (two) times daily as needed (rash).    [provider]  UNABLE TO FIND Take 1 tablet by mouth daily. Med Name: [name] 1 by mouth daily for UTI prevention    [provider]  vitamin B-12 (CYANOCOBALAMIN) 1000 MCG tablet TAKE 1 TABLET BY MOUTH DAILY 02/14/21   [provider]  ZINC OXIDE, TOPICAL, 10 % CREA Apply 1 application topically 2 (two) times daily as needed (rash). 02/14/21   Aline August, MD      Allergies    Imitrex [sumatriptan], Tetracycline, Corticosteroids, Cefixime, Ciprofloxacin, Doxycycline, Gabapentin, Isometheptene-dichloral-apap, Ketorolac, Prednisone, Promethazine, Stadol [butorphanol], Tape, Erythromycin base, and Propoxyphene    Review of Systems   Review of Systems  Cardiovascular:  Positive for chest pain.  Neurological:  Positive for dizziness and headaches.  All other systems reviewed and are negative.   Physical Exam Updated Vital Signs BP 132/76   Pulse 96   Temp 97.7 F (36.5 C)   Resp 18   Ht 5\' 4"  (1.626 m)   Wt 63.5 kg   SpO2 100%   BMI 24.03 kg/m  Physical Exam Vitals  and nursing note reviewed.  Constitutional:      General: She is not in acute distress.    Appearance: Normal appearance.     Comments: Chronically weak, ill appearing  HENT:     Head: Normocephalic and atraumatic.  Eyes:     General:        Right eye: No discharge.        Left eye: No discharge.  Cardiovascular:     Rate and Rhythm: Normal rate and regular rhythm.     Heart sounds: No murmur heard.    No friction rub. No gallop.  Pulmonary:     Effort: Pulmonary effort is normal.     Breath sounds: Normal breath sounds.  Abdominal:     General: Bowel sounds are normal.     Palpations: Abdomen is soft.  Skin:    General: Skin is warm  and dry.     Capillary Refill: Capillary refill takes less than 2 seconds.  Neurological:     Mental Status: She is alert and oriented to person, place, and time.  Psychiatric:        Mood and Affect: Mood normal.        Behavior: Behavior normal.     ED Results / Procedures / Treatments   Labs (all labs ordered are listed, but only abnormal results are displayed) Labs Reviewed  BASIC METABOLIC PANEL - Abnormal; Notable for the following components:      Result Value   Potassium 3.1 (*)    Glucose, Bld 100 (*)    BUN 32 (*)    Creatinine, Ser 1.52 (*)    GFR, Estimated 36 (*)    All other components within normal limits  CBC  TROPONIN I (HIGH SENSITIVITY)  TROPONIN I (HIGH SENSITIVITY)    EKG EKG Interpretation  Date/Time:  Tuesday March 24 2022 14:08:54 EDT Ventricular Rate:  89 PR Interval:  153 QRS Duration: 137 QT Interval:  359 QTC Calculation: 437 R Axis:   49 Text Interpretation: Sinus rhythm Left bundle branch block No significant change since last tracing Confirmed by Deno Etienne [id number]) on 03/24/2022 2:12:37 PM  Radiology DG Chest 2 View  Result Date: 03/24/2022 CLINICAL DATA:  Chest pain EXAM: CHEST - 2 VIEW COMPARISON:  06/26/2021 FINDINGS: Heart size and vascularity normal. Lungs clear without infiltrate or  effusion Port-A-Cath tip in the lower SVC unchanged. Surgical clips right medial lung base. Right thoracotomy changes. Multiple spinal cord stimulators in the thoracic spine unchanged from the prior study. IMPRESSION: No active cardiopulmonary disease. Electronically Signed   By: Franchot Gallo M.D.   On: 03/24/2022 14:40    Procedures Procedures    Medications Ordered in ED Medications  sodium chloride 0.9 % bolus 500 mL (0 mLs Intravenous Stopped 03/24/22 1540)  morphine (PF) 4 MG/ML injection 4 mg (4 mg Intravenous Given 03/24/22 1508)  potassium chloride SA (KLOR-CON M) CR tablet 40 mEq (40 mEq Oral Given 03/24/22 1628)  heparin lock flush 100 unit/mL (500 Units Intracatheter Given 03/24/22 1702)    ED Course/ Medical Decision Making/ A&P                           Medical Decision Making Amount and/or Complexity of Data Reviewed Labs: ordered. Radiology: ordered.  Risk Prescription drug management.   This patient is a 76 y.o. female who presents to the ED for concern of chest throbbing, general malaise, headache, back pain, neck pain, this involves an extensive number of treatment options, and is a complaint that carries with it a high risk of complications and morbidity. The emergent differential diagnosis prior to evaluation includes, but is not limited to,  ACS, AAS, PE, Mallory-Weiss, Boerhaave's, Pneumonia, acute bronchitis, asthma or COPD exacerbation, anxiety, MSK pain or traumatic injury to the chest, acid reflux versus other, general myalgias, think adverse prednisone reaction most likely.   This is not an exhaustive differential.   Past Medical History / Co-morbidities / Social History:  immunoglobulin deficiency, PE, acid reflux, history of opioid abuse, lung cancer, bipolar disorder  Additional history: Chart reviewed. Pertinent results include: reviewed labwork, imaging from recent previous ED visits, eval at PCP for PMR. Elevated Sed rate at ED, no other confirmatory  testing done at this time.  Physical Exam: Physical exam performed. The pertinent findings include: chronically ill appearing patient  Lab  Tests: I ordered, and personally interpreted labs.  The pertinent results include: Patient with some hypokalemia, potassium 3.1.  Will orally replete.  She has minimally elevated creatinine and BUN, however similar to her baseline.  BUN 32, creatinine 1.52.  Troponin negative x 2 in context of chest pounding sensation without overt report of "chest pain".  CBC unremarkable.   Imaging Studies: I ordered imaging studies including chest x-ray. I independently visualized and interpreted imaging which showed no acute intrathoracic abnormality. I agree with the radiologist interpretation.   Cardiac Monitoring:  The patient was maintained on a cardiac monitor.  My attending physician Dr. Rogene Houston viewed and interpreted the cardiac monitored which showed an underlying rhythm of: Sinus rhythm, left bundle branch block. I agree with this interpretation.   Medications: I ordered medication including potassium for hypokalemia, morphine for pain. Reevaluation of the patient after these medicines showed that the patient improved. I have reviewed the patients home medicines and have made adjustments as needed.   Disposition: After consideration of the diagnostic results and the patients response to treatment, I feel that overall patient appears well.  She does seem to be having some side effects related to her high-dose prednisone usage.  Had a discussion with the patient who was wondering whether she could receive IV prednisone instead of oral prednisone as she thinks that the oral component is what is leading to the majority of her side effects.  Discussed with patient that I would not recommend IV steroids as she has already had a dose of steroids today, and do not think that getting IV steroids is likely to improve her medication reaction symptoms, this time discussed if  she thinks that her symptoms are because of her steroids she can stop and follow-up with her PCP. rheumatology for formal reevaluation of her new PMR diagnosis.   emergency department workup does not suggest an emergent condition requiring admission or immediate intervention beyond what has been performed at this time. The plan is: Discharge home, follow-up with PCP/rheumatology. The patient is safe for discharge and has been instructed to return immediately for worsening symptoms, change in symptoms or any other concerns.  I discussed this case with my attending physician Dr. Langston Masker who cosigned this note including patient's presenting symptoms, physical exam, and planned diagnostics and interventions. Attending physician stated agreement with plan or made changes to plan which were implemented.    Final Clinical Impression(s) / ED Diagnoses Final diagnoses:  Acute nonintractable headache, unspecified headache type  Body aches    Rx / DC Orders ED Discharge Orders     None         Anselmo Pickler, PA-C 03/25/22 0902    Fredia Sorrow, MD 03/27/22 1125

## 2022-03-24 NOTE — ED Notes (Signed)
ED Provider at bedside. 

## 2022-03-24 NOTE — ED Triage Notes (Signed)
Patient c/o headache, dizziness, ringing in ears and "pounding in chest" ; reported recently diagnose with PMR and was prescribed oral prednisone to take six times a day; Patient stated she is not able to tolerate them and concern that symptoms are from the medications.

## 2022-03-25 ENCOUNTER — Telehealth: Payer: Self-pay

## 2022-03-25 ENCOUNTER — Telehealth (INDEPENDENT_AMBULATORY_CARE_PROVIDER_SITE_OTHER): Payer: Medicare PPO | Admitting: Family Medicine

## 2022-03-25 DIAGNOSIS — M353 Polymyalgia rheumatica: Secondary | ICD-10-CM

## 2022-03-25 NOTE — Telephone Encounter (Signed)
Patient called requesting a telephone visit with Dr.Miller or [name] today if possible.  Patient seen Monina on Monday with concerns about side effects to Prednisone (see visit encounter) and states nothing was done.  Symptoms progressed and patient went to the ER yesterday and they re-routed her back to her PCP office.  Patient is not established with a rheumatologist here, yet and has a pending appointment for September 2023.   Patient states she is begging and pleaded for a provider to talk to her. Patient states she is requesting Dr.Miler or [name] as she has difficulty hearing persons with a dialect    Dr.Miller please advise if patient can be added to your schedule.  [name] is off today

## 2022-03-25 NOTE — Telephone Encounter (Signed)
Patient had a telephone visit with Dr.Miller

## 2022-03-25 NOTE — Progress Notes (Signed)
This service is provided via telemedicine  No vital signs collected/recorded due to the encounter was a telemedicine visit.   Location of patient (ex: home, work):  Home  Patient consents to a telephone visit:  09/30/21  Location of the provider (ex: office, home):  Office  Name of any referring provider:  [name], NP   Names of all persons participating in the telemedicine service and their role in the encounter:  Megan Brewer (patient); Porsha McClurkin,CMA; Lillette Boxer. Lyle Niblett,MD  Time spent on call:  6 minutes   Virtual Visit via Telephone/video  I connected with patient 03/25/22 at  by telephone/video and verified that I am speaking with the correct person using two identifiers.   Location:  Patient: home Provider: [facility] clinic      Provider:  Alain Honey, MD  Careteam: Patient Care Team: [name], NP as PCP - General (Geriatric Medicine) Minus Breeding, MD as PCP - Cardiology (Cardiology) Alda Berthold, DO as Consulting Physician (Neurology)  PLACE OF SERVICE:  [place]  Advanced Directive information    Allergies  Allergen Reactions   Imitrex [Sumatriptan] Nausea And Vomiting    Ringing in the ears, migraines are worse   Tetracycline Nausea Only    Causes ringing in ears, dizziness, migraine, nausea   Corticosteroids Other (See Comments)    12/02/2016 interview by Melissa Montane: Oral dosing causes migraines/nausea/tinnitus. Prev tolerated IV and intranasal admin w/o difficulty.   Cefixime Other (See Comments)    Headache    Ciprofloxacin Other (See Comments)    Headache, dizziness, ringing in the ears   Doxycycline Other (See Comments)    unknown   Gabapentin     Throat swells/closes   Isometheptene-Dichloral-Apap Other (See Comments)    unknown   Ketorolac     12/02/2016 interview by JKD: Oral dosing prednisone/corticosteroids causes migraines/nausea/tinnitus. Prev tolerated IV and intranasal admin w/o difficulty.   Prednisone Other  (See Comments)    Migraine, dizzy, ringing in ears-can take it IV 12/02/2016 interview by [name]: Oral prednisone dosing causes migraines/nausea/tinnitus. Prev tolerated IV and intranasal admin w/o difficulty.   Promethazine Other (See Comments)    causes severe abdominal pain when taken IV; however she can take PO or IM   Stadol [Butorphanol]     Cerebral pain   Tape Other (See Comments)    Adhesive tapes-patient denies   Erythromycin Base Diarrhea and Itching    Severe diarrhea   Propoxyphene Nausea Only    Chief Complaint  Patient presents with   Acute Visit    Patient reports taking Prednisone 60 mg daily and it  is causing migraine, ringing in ears, edema, dizziness. She reports not been able to take prednisone oral due to always having side effects. She states that the ER doctor told her to follow up with her PCP due to the side effects.     HPI: Patient is a 76 y.o. female .  Patient was vacationing earlier this week in [facility] when she started developing some pain in her shoulders and upper back neck and arms.  She went to the emergency room for some blood work was done and she was diagnosed with polymyalgia rheumatica.  She was started on 60 mg of prednisone but that is causing her side effects such as ringing of the ears edema dizziness migraine.  She is responding rapidly to the prednisone.  All this makes me think the diagnosis is correct but her dose of prednisone I think could be  reduced significantly and still treat symptoms.  Review of Systems:  Review of Systems  HENT:  Positive for tinnitus.   Musculoskeletal:  Positive for back pain and neck pain.  Neurological:  Positive for headaches.  All other systems reviewed and are negative.   Past Medical History:  Diagnosis Date   Anemia    Arachnoiditis    Bipolar disorder (HCC)    C. 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[place])    Lung cancer ([place]) 2011, 2014   Memory loss    Migraine    Opioid abuse ([place])    Osteoporosis    Presence of neurostimulator    Restless legs    SIRS (systemic inflammatory response syndrome) ([place])    Sleep apnea    Past Surgical History:  Procedure Laterality Date   ABDOMINAL HYSTERECTOMY     CARPAL TUNNEL RELEASE     CATARACT EXTRACTION Bilateral 2019   CHOLECYSTECTOMY     COLONOSCOPY  2015   [facility]   CT LUNG SCREENING  2018   DENTAL SURGERY  06/17/2021   4 teeth rmoved   DG  BONE DENSITY ([place] HX)  2018   DIAGNOSTIC MAMMOGRAM  2019   GASTRIC BYPASS     LUNG CANCER SURGERY  02/2010   MULTIPLE TOOTH EXTRACTIONS     PORT A CATH REVISION Right 04/2021   PORT-A-CATH REMOVAL Left 02/07/2021   Procedure: REMOVAL PORT-A-CATH;  Surgeon: Kieth Brightly Arta Bruce, MD;  Location: [facility];  Service: General;  Laterality: Left;  60 MINUTES ROOM 4   SPINAL CORD STIMULATOR IMPLANT     Social History:   reports that she has never smoked. She has never used smokeless tobacco. She reports that she does not currently use alcohol. She reports that she does not use drugs.  Family History  Problem Relation Age of Onset   Hypertension Mother    GER disease Mother    Dementia Mother    Osteoporosis Mother    Arthritis Mother    Bipolar disorder Mother    Parkinson's disease Father    Congestive Heart Failure Father    Pneumonia Father    Arthritis Father    Dementia Father    Depression Father    Asthma Sister    Depression Sister    Bipolar disorder Brother    Asthma Daughter    Colon cancer Neg Hx    Esophageal cancer Neg Hx    Stomach cancer Neg Hx    Rectal cancer Neg Hx     Medications: Patient's Medications  New Prescriptions   No medications on file  Previous Medications   ACETAMINOPHEN  (TYLENOL) 500 MG TABLET    Take 500 mg by mouth every 6 (six) hours as needed for moderate pain.   AZELASTINE (ASTELIN) 0.1 % NASAL SPRAY    Place 2 sprays into both nostrils 2 (two) times daily.   BACLOFEN (LIORESAL) 10 MG TABLET    Take 10 mg by mouth daily as needed for muscle spasms.   BUPROPION (WELLBUTRIN XL) 150 MG 24 HR TABLET    Take 1 tablet (150 mg total) by mouth daily.   CALCIUM PO    Take 1 tablet by mouth in  the morning and at bedtime.   CARBIDOPA-LEVODOPA (SINEMET IR) 25-100 MG TABLET    Take as needed for severe restless leg syndrome.   CHOLECALCIFEROL (VITAMIN D3) 25 MCG (1000 UNIT) TABLET    Take 1,000 Units by mouth daily.   CYCLOSPORINE (RESTASIS) 0.05 % OPHTHALMIC EMULSION    Place 1 drop into both eyes in the morning, at noon, in the evening, and at bedtime.   DEXAMETHASONE (DECADRON PO)    Take 1 tablet by mouth. Every 21 days   DEXLANSOPRAZOLE (DEXILANT) 60 MG CAPSULE    Take 1 capsule (60 mg total) by mouth daily. **PLEASE CONTACT OFFICE TO SCHEDULE APPOINTMENT   FAMOTIDINE (PEPCID) 20 MG TABLET    One after supper   FERROUS SULFATE 325 (65 FE) MG TABLET    Take 650 mg by mouth daily with breakfast.   GAMMAGARD 20 GM/200ML SOLN    Inject into the vein. Every 21 days   HEPARIN LOCK FLUSH 100 UNIT/ML SOLN INJECTION    Inject 500 Units into the vein every 21 ( twenty-one) days.   HYDROXYZINE (VISTARIL) 25 MG CAPSULE    Take 25 mg by mouth at bedtime.   IBUPROFEN (ADVIL) 200 MG TABLET    Take 600 mg by mouth as needed for mild pain.   IMMUNE GLOBULIN, HUMAN, (GAMMAGARD S/D LESS IGA) 10 G INJECTION    Inject 2 g/kg into the vein every 21 ( twenty-one) days.   LAMOTRIGINE (LAMICTAL) 200 MG TABLET    Take 1 tablet (200 mg total) by mouth at bedtime.   LEVOTHYROXINE (SYNTHROID) 75 MCG TABLET    TAKE ONE (1) TABLET BY MOUTH EACH DAY 30MINUTES BEFORE BREAKFAST ON EMPTY STOMACH   MAGNESIUM OXIDE (MAG-OX) 400 MG TABLET    Take 400 mg by mouth daily.    MELATONIN 5 MG TABS    Take 5  mg by mouth.   MIRTAZAPINE (REMERON) 30 MG TABLET    Take 1 tablet (30 mg total) by mouth at bedtime.   MUPIROCIN OINTMENT (BACTROBAN) 2 %    Apply 1 application. topically 2 (two) times daily.   OXYBUTYNIN (DITROPAN) 5 MG/5ML SYRUP    TAKE 2.5 MLS BY MOUTH TWICE A DAY   POTASSIUM CHLORIDE (KLOR-CON) 10 MEQ TABLET    Take 10 mEq by mouth 4 (four) times daily.   PREDNISONE (STERAPRED UNI-PAK 48 TAB) 10 MG (48) TBPK TABLET    Take 60 mg by mouth as directed.   ROPINIROLE (REQUIP) 1 MG TABLET    Take 0.5 tablet at 2p and 1.5 tablet 2-3 hour prior to bedtime.   SODIUM CHLORIDE, PF, 0.9 % INJECTION    Inject 5-10 mLs into the vein as needed (Immunoglobulin).   TAMSULOSIN (FLOMAX) 0.4 MG CAPS CAPSULE    Take 1 capsule (0.4 mg total) by mouth daily.   TORSEMIDE (DEMADEX) 20 MG TABLET    Take 20 mg by mouth daily.   TRIAMCINOLONE CREAM (KENALOG) 0.1 %    Apply 1 application topically 2 (two) times daily as needed (rash).   UNABLE TO FIND    Take 1 tablet by mouth daily. Med Name: [name] 1 by mouth daily for UTI prevention   VITAMIN B-12 (CYANOCOBALAMIN) 1000 MCG TABLET    TAKE 1 TABLET BY MOUTH DAILY   ZINC OXIDE, TOPICAL, 10 % CREA    Apply 1 application topically 2 (two) times daily as needed (rash).  Modified Medications   No medications on file  Discontinued Medications   No  medications on file    Physical Exam: Exam not performed as this was a telephonic visit  There were no vitals filed for this visit. There is no height or weight on file to calculate BMI. Wt Readings from Last 3 Encounters:  03/24/22 140 lb (63.5 kg)  03/23/22 140 lb (63.5 kg)  12/29/21 142 lb (64.4 kg)    Physical Exam Nursing note reviewed.     Labs reviewed: Basic Metabolic Panel: Recent Labs    06/26/21 1607 08/14/21 1059 11/20/21 0956 03/08/22 1242 03/24/22 1503  NA 139   < > 140 137 140  K 4.6   < > 4.2 3.0* 3.1*  CL 104   < > 107* 102 106  CO2 23   < > 18* 25 27  GLUCOSE 100*   < > 88 127* 100*   BUN 28*   < > 17 33* 32*  CREATININE 1.12   < > 0.97 1.37* 1.52*  CALCIUM 9.1   < > 8.2* 9.2 9.4  MG  --   --   --  1.8  --   TSH 4.42  --   --   --   --    < > = values in this interval not displayed.   Liver Function Tests: Recent Labs    04/03/21 1321 06/23/21 1056 03/08/22 1242  AST 62* 20 28  ALT 30 14 25   ALKPHOS 93 73 81  BILITOT 0.2* 0.3 0.7  PROT 8.5* 7.1 7.5  ALBUMIN 4.0 3.7 3.5   Recent Labs    03/08/22 1242  LIPASE 52*   No results for input(s): "AMMONIA" in the last 8760 hours. CBC: Recent Labs    06/23/21 1056 06/26/21 1607 03/08/22 1242 03/24/22 1503  WBC 8.5 7.6 6.9 7.8  NEUTROABS 6.4 5.1 4.8  --   HGB 14.1 14.0 13.5 12.5  HCT 41.7 42.0 39.8 36.8  MCV 87.8 88.7 87.5 88.0  PLT 219 245.0 189 190   Lipid Panel: No results for input(s): "CHOL", "HDL", "LDLCALC", "TRIG", "CHOLHDL", "LDLDIRECT" in the last 8760 hours. TSH: Recent Labs    06/26/21 1607  TSH 4.42   A1C: No results found for: "HGBA1C"   Assessment/Plan  1. Polymyalgia rheumatica (HCC) Symptoms, response to prednisone and possible blood work as well as improvement rapidly with prednisone all point to this diagnosis.  In discussion with patient over phone I have encouraged her to continue with prednisone but at a much reduced dose.  Recommended starting doses 12.5 to 25 mg of prednisone.  She was started on 60 mg.  My thought would be to reduce the dose to 20 mg, where she takes 10 mg twice a day.  That would be continued for 2 to 4 weeks and then a taper would began.  I have asked her to come to the office next week and we will check sed rate and C-reactive protein.  We could begin taper after 2 weeks depending on her response to taper.   Alain Honey, MD [facility] [phone]   I discussed the limitations, risks, security and privacy concerns of performing an evaluation and management service by telephone and the availability of in person  appointments. I also discussed with the patient that there may be a patient responsible charge related to this service. The patient expressed understanding and agreed to proceed.    I discussed the assessment and treatment plan with the patient. The patient was provided an opportunity to ask questions  and all were answered. The patient agreed with the plan and demonstrated an understanding of the instructions.     The patient was advised to call back or seek an in-person evaluation if the symptoms worsen or if the condition fails to improve as anticipated.   I provided 15 minutes of non-face-to-face time during this encounter

## 2022-03-30 ENCOUNTER — Other Ambulatory Visit: Payer: Self-pay | Admitting: Physician Assistant

## 2022-03-30 ENCOUNTER — Other Ambulatory Visit: Payer: Self-pay | Admitting: Nurse Practitioner

## 2022-03-31 ENCOUNTER — Telehealth: Payer: Self-pay | Admitting: Physician Assistant

## 2022-03-31 DIAGNOSIS — J383 Other diseases of vocal cords: Secondary | ICD-10-CM | POA: Diagnosis not present

## 2022-03-31 MED ORDER — DEXLANSOPRAZOLE 60 MG PO CPDR: 60.0000 mg | DELAYED_RELEASE_CAPSULE | Freq: Every day | ORAL | 0 refills | Status: DC

## 2022-03-31 NOTE — Telephone Encounter (Signed)
Inbound call from patient stating she needed to make an appointment for a medication refill for DEXILANT. Patient was scheduled for 9/14 at 3:00. Please advise.

## 2022-03-31 NOTE — Telephone Encounter (Signed)
30 day supply has been sent to patient's pharmacy. Voicemail has been left to let patient know, and that she needs to keep her appointment to receive further refills.

## 2022-04-01 ENCOUNTER — Encounter: Payer: Self-pay | Admitting: Family Medicine

## 2022-04-01 ENCOUNTER — Ambulatory Visit: Payer: Medicare PPO | Admitting: Family Medicine

## 2022-04-01 VITALS — BP 128/80 | HR 83 | Temp 96.9°F

## 2022-04-01 DIAGNOSIS — R110 Nausea: Secondary | ICD-10-CM

## 2022-04-01 DIAGNOSIS — G8929 Other chronic pain: Secondary | ICD-10-CM | POA: Diagnosis not present

## 2022-04-01 DIAGNOSIS — M353 Polymyalgia rheumatica: Secondary | ICD-10-CM | POA: Diagnosis not present

## 2022-04-01 DIAGNOSIS — R531 Weakness: Secondary | ICD-10-CM

## 2022-04-01 DIAGNOSIS — M25512 Pain in left shoulder: Secondary | ICD-10-CM | POA: Diagnosis not present

## 2022-04-01 DIAGNOSIS — M25511 Pain in right shoulder: Secondary | ICD-10-CM | POA: Diagnosis not present

## 2022-04-01 MED ORDER — HYDROCODONE-ACETAMINOPHEN 5-325 MG PO TABS: 1.0000 | ORAL_TABLET | Freq: Four times a day (QID) | ORAL | 0 refills | Status: DC | PRN

## 2022-04-01 MED ORDER — POTASSIUM CHLORIDE ER 10 MEQ PO TBCR: 10.0000 meq | EXTENDED_RELEASE_TABLET | Freq: Four times a day (QID) | ORAL | 1 refills | Status: DC

## 2022-04-01 MED ORDER — METHYLPREDNISOLONE ACETATE 80 MG/ML IJ SUSP: 40.0000 mg | Freq: Once | INTRAMUSCULAR | Status: DC

## 2022-04-01 MED ORDER — ONDANSETRON HCL 4 MG/2ML IJ SOLN
4.0000 mg | Freq: Once | INTRAMUSCULAR | Status: AC
  Administered 2022-04-01: 4 mg via INTRAMUSCULAR

## 2022-04-01 MED ORDER — SODIUM CHLORIDE 0.9 % IV SOLN: 4.0000 mg | Freq: Once | INTRAVENOUS | Status: DC

## 2022-04-01 MED ORDER — METHYLPREDNISOLONE ACETATE 80 MG/ML IJ SUSP
40.0000 mg | Freq: Once | INTRAMUSCULAR | Status: AC
  Administered 2022-04-01: 40 mg via INTRAMUSCULAR

## 2022-04-01 MED ORDER — ONDANSETRON HCL 4 MG PO TABS: 4.0000 mg | ORAL_TABLET | Freq: Three times a day (TID) | ORAL | 0 refills | Status: DC | PRN

## 2022-04-01 NOTE — Progress Notes (Signed)
Provider:  Alain Honey, MD  Careteam: Patient Care Team: [name], NP as PCP - General (Geriatric Medicine) Minus Breeding, MD as PCP - Cardiology (Cardiology) Alda Berthold, DO as Consulting Physician (Neurology)  PLACE OF SERVICE:  [place]  Advanced Directive information    Allergies  Allergen Reactions   Imitrex [Sumatriptan] Nausea And Vomiting    Ringing in the ears, migraines are worse   Tetracycline Nausea Only    Causes ringing in ears, dizziness, migraine, nausea   Corticosteroids Other (See Comments)    12/02/2016 interview by Melissa Montane: Oral dosing causes migraines/nausea/tinnitus. Prev tolerated IV and intranasal admin w/o difficulty.   Cefixime Other (See Comments)    Headache    Ciprofloxacin Other (See Comments)    Headache, dizziness, ringing in the ears   Doxycycline Other (See Comments)    unknown   Gabapentin     Throat swells/closes   Isometheptene-Dichloral-Apap Other (See Comments)    unknown   Ketorolac     12/02/2016 interview by YSA: Oral dosing prednisone/corticosteroids causes migraines/nausea/tinnitus. Prev tolerated IV and intranasal admin w/o difficulty.   Prednisone Other (See Comments)    Migraine, dizzy, ringing in ears-can take it IV 12/02/2016 interview by [name]: Oral prednisone dosing causes migraines/nausea/tinnitus. Prev tolerated IV and intranasal admin w/o difficulty.   Promethazine Other (See Comments)    causes severe abdominal pain when taken IV; however she can take PO or IM   Stadol [Butorphanol]     Cerebral pain   Tape Other (See Comments)    Adhesive tapes-patient denies   Erythromycin Base Diarrhea and Itching    Severe diarrhea   Propoxyphene Nausea Only    Chief Complaint  Patient presents with   Medical Management of Chronic Issues    Patient presents today for a 1 week follow-up     HPI: Patient is a 76 y.o. female .  Patient is here seen in follow-up after a telephone consultation about 2  weeks ago.  While visiting her family in [facility] developed symptoms of pain in her neck and shoulders and upper back consistent with polymyalgia.  She was started on prednisone and even though she is seem to improve developed side effects of the prednisone.  She called the office and I spoke to her and we reduce the prednisone from 60 to 20 mg which is more than the recommended dose for this condition.  Symptoms returned as the side effects continue.  She presents today with weakness, nausea, dizziness, and headache.  She thinks based on past experience that if the steroids were given parenterally such as IM we could avoid some of the side effects.  My thinking is that if we compromised on the dose of steroid not going back to 60 but increasing 20-40 that that might help her symptoms.  This was given IM.  Referral was also made to rheumatology for urgent consultation.  This lady is new to me but she seems very complicated medically speaking.  There is a history of some immuno deficiency syndrome as well as C. difficile.  She has had work-ups for heart and lung in the past year.  Review of Systems:  Review of Systems  Constitutional:  Positive for malaise/fatigue.  Eyes: Negative.   Respiratory: Negative.    Cardiovascular: Negative.   Gastrointestinal:  Positive for nausea.  Musculoskeletal:  Positive for back pain, joint pain and myalgias.  Neurological:  Positive for dizziness and headaches.    Past Medical  History:  Diagnosis Date   Anemia    Arachnoiditis    Bipolar disorder (HCC)    C. 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[place])    Lung cancer ([place]) 2011, 2014   Memory loss    Migraine    Opioid abuse ([place])    Osteoporosis     Presence of neurostimulator    Restless legs    SIRS (systemic inflammatory response syndrome) ([place])    Sleep apnea    Past Surgical History:  Procedure Laterality Date   ABDOMINAL HYSTERECTOMY     CARPAL TUNNEL RELEASE     CATARACT EXTRACTION Bilateral 2019   CHOLECYSTECTOMY     COLONOSCOPY  2015   [facility]   CT LUNG SCREENING  2018   DENTAL SURGERY  06/17/2021   4 teeth rmoved   DG  BONE DENSITY ([place] HX)  2018   DIAGNOSTIC MAMMOGRAM  2019   GASTRIC BYPASS     LUNG CANCER SURGERY  02/2010   MULTIPLE TOOTH EXTRACTIONS     PORT A CATH REVISION Right 04/2021   PORT-A-CATH REMOVAL Left 02/07/2021   Procedure: REMOVAL PORT-A-CATH;  Surgeon: Kieth Brightly Arta Bruce, MD;  Location: [facility];  Service: General;  Laterality: Left;  60 MINUTES ROOM 4   SPINAL CORD STIMULATOR IMPLANT     Social History:   reports that she has never smoked. She has never used smokeless tobacco. She reports that she does not currently use alcohol. She reports that she does not use drugs.  Family History  Problem Relation Age of Onset   Hypertension Mother    GER disease Mother    Dementia Mother    Osteoporosis Mother    Arthritis Mother    Bipolar disorder Mother    Parkinson's disease Father    Congestive Heart Failure Father    Pneumonia Father    Arthritis Father    Dementia Father    Depression Father    Asthma Sister    Depression Sister    Bipolar disorder Brother    Asthma Daughter    Colon cancer Neg Hx    Esophageal cancer Neg Hx    Stomach cancer Neg Hx    Rectal cancer Neg Hx     Medications: Patient's Medications  New Prescriptions   HYDROCODONE-ACETAMINOPHEN (NORCO) 5-325 MG TABLET    Take 1 tablet by mouth every 6 (six) hours as needed for moderate pain.   ONDANSETRON (ZOFRAN) 4 MG TABLET    Take 1 tablet (4 mg total) by mouth every 8 (eight) hours as needed for nausea or vomiting.  Previous Medications   ACETAMINOPHEN (TYLENOL) 500 MG TABLET    Take 500 mg by mouth every 6 (six)  hours as needed for moderate pain.   AZELASTINE (ASTELIN) 0.1 % NASAL SPRAY    Place 2 sprays into both nostrils 2 (two) times daily.   BACLOFEN (LIORESAL) 10 MG TABLET    Take 10 mg by mouth daily as needed for muscle spasms.   BUPROPION (WELLBUTRIN XL) 150 MG 24 HR TABLET    Take 1 tablet (150 mg total) by mouth daily.   CALCIUM PO    Take 1 tablet by mouth in the morning and  at bedtime.   CARBIDOPA-LEVODOPA (SINEMET IR) 25-100 MG TABLET    Take as needed for severe restless leg syndrome.   CHOLECALCIFEROL (VITAMIN D3) 25 MCG (1000 UNIT) TABLET    Take 1,000 Units by mouth daily.   CYCLOSPORINE (RESTASIS) 0.05 % OPHTHALMIC EMULSION    Place 1 drop into both eyes in the morning, at noon, in the evening, and at bedtime.   DEXAMETHASONE (DECADRON PO)    Take 1 tablet by mouth. Every 21 days   DEXLANSOPRAZOLE (DEXILANT) 60 MG CAPSULE    Take 1 capsule (60 mg total) by mouth daily. **PLEASE KEEP APPOINTMENT FOR FURTHER REFILLS   FAMOTIDINE (PEPCID) 20 MG TABLET    One after supper   FERROUS SULFATE 325 (65 FE) MG TABLET    Take 650 mg by mouth daily with breakfast.   GAMMAGARD 20 GM/200ML SOLN    Inject into the vein. Every 21 days   HEPARIN LOCK FLUSH 100 UNIT/ML SOLN INJECTION    Inject 500 Units into the vein every 21 ( twenty-one) days.   HYDROXYZINE (VISTARIL) 25 MG CAPSULE    Take 25 mg by mouth at bedtime.   IBUPROFEN (ADVIL) 200 MG TABLET    Take 600 mg by mouth as needed for mild pain.   IMMUNE GLOBULIN, HUMAN, (GAMMAGARD S/D LESS IGA) 10 G INJECTION    Inject 2 g/kg into the vein every 21 ( twenty-one) days.   LAMOTRIGINE (LAMICTAL) 200 MG TABLET    Take 1 tablet (200 mg total) by mouth at bedtime.   LEVOTHYROXINE (SYNTHROID) 75 MCG TABLET    TAKE ONE (1) TABLET BY MOUTH EACH DAY 30MINUTES BEFORE BREAKFAST ON EMPTY STOMACH   MAGNESIUM OXIDE (MAG-OX) 400 MG TABLET    Take 400 mg by mouth daily.    MELATONIN 5 MG TABS    Take 5 mg by mouth.   MIRTAZAPINE (REMERON) 30 MG TABLET    Take 1  tablet (30 mg total) by mouth at bedtime.   MUPIROCIN OINTMENT (BACTROBAN) 2 %    Apply 1 application. topically 2 (two) times daily.   OXYBUTYNIN (DITROPAN) 5 MG/5ML SYRUP    TAKE 2.5 MLS BY MOUTH TWICE A DAY   POTASSIUM CHLORIDE (KLOR-CON) 10 MEQ TABLET    Take 10 mEq by mouth 4 (four) times daily.   ROPINIROLE (REQUIP) 1 MG TABLET    Take 0.5 tablet at 2p and 1.5 tablet 2-3 hour prior to bedtime.   SODIUM CHLORIDE, PF, 0.9 % INJECTION    Inject 5-10 mLs into the vein as needed (Immunoglobulin).   TAMSULOSIN (FLOMAX) 0.4 MG CAPS CAPSULE    Take 1 capsule (0.4 mg total) by mouth daily.   TORSEMIDE (DEMADEX) 20 MG TABLET    Take 20 mg by mouth daily.   TRIAMCINOLONE CREAM (KENALOG) 0.1 %    Apply 1 application topically 2 (two) times daily as needed (rash).   UNABLE TO FIND    Take 1 tablet by mouth daily. Med Name: Megan Brewer 1 by mouth daily for UTI prevention   VITAMIN B-12 (CYANOCOBALAMIN) 1000 MCG TABLET    TAKE 1 TABLET BY MOUTH DAILY   ZINC OXIDE, TOPICAL, 10 % CREA    Apply 1 application topically 2 (two) times daily as needed (rash).  Modified Medications   No medications on file  Discontinued Medications   PREDNISONE (STERAPRED UNI-PAK 48 TAB) 10 MG (48) TBPK TABLET    Take 60 mg by mouth as directed.    Physical Exam:  Vitals:   04/01/22 0834  BP: 128/80  Pulse: 83  Temp: (!) 96.9 F (36.1 C)  SpO2: 98%   There is no height or weight on file to calculate BMI. Wt Readings from Last 3 Encounters:  03/24/22 140 lb (63.5 kg)  03/23/22 140 lb (63.5 kg)  12/29/21 142 lb (64.4 kg)    Physical Exam Vitals and nursing note reviewed.  Constitutional:      Appearance: Normal appearance.     Comments: Appears ill  Cardiovascular:     Rate and Rhythm: Normal rate and regular rhythm.  Pulmonary:     Effort: Pulmonary effort is normal.     Breath sounds: Normal breath sounds.  Musculoskeletal:     Comments: Tenderness around her neck and shoulders with palpation  Neurological:      General: No focal deficit present.     Mental Status: She is alert and oriented to person, place, and time.     Labs reviewed: Basic Metabolic Panel: Recent Labs    06/26/21 1607 08/14/21 1059 11/20/21 0956 03/08/22 1242 03/24/22 1503  NA 139   < > 140 137 140  K 4.6   < > 4.2 3.0* 3.1*  CL 104   < > 107* 102 106  CO2 23   < > 18* 25 27  GLUCOSE 100*   < > 88 127* 100*  BUN 28*   < > 17 33* 32*  CREATININE 1.12   < > 0.97 1.37* 1.52*  CALCIUM 9.1   < > 8.2* 9.2 9.4  MG  --   --   --  1.8  --   TSH 4.42  --   --   --   --    < > = values in this interval not displayed.   Liver Function Tests: Recent Labs    04/03/21 1321 06/23/21 1056 03/08/22 1242  AST 62* 20 28  ALT 30 14 25   ALKPHOS 93 73 81  BILITOT 0.2* 0.3 0.7  PROT 8.5* 7.1 7.5  ALBUMIN 4.0 3.7 3.5   Recent Labs    03/08/22 1242  LIPASE 52*   No results for input(s): "AMMONIA" in the last 8760 hours. CBC: Recent Labs    06/23/21 1056 06/26/21 1607 03/08/22 1242 03/24/22 1503  WBC 8.5 7.6 6.9 7.8  NEUTROABS 6.4 5.1 4.8  --   HGB 14.1 14.0 13.5 12.5  HCT 41.7 42.0 39.8 36.8  MCV 87.8 88.7 87.5 88.0  PLT 219 245.0 189 190   Lipid Panel: No results for input(s): "CHOL", "HDL", "LDLCALC", "TRIG", "CHOLHDL", "LDLDIRECT" in the last 8760 hours. TSH: Recent Labs    06/26/21 1607  TSH 4.42   A1C: No results found for: "HGBA1C"   Assessment/Plan  1. PMR (polymyalgia rheumatica) (HCC) Assuming this diagnosis is correct.  Treatment as of today as outlined in the above  2. Nausea     4. Weakness Probably multifactorial including inactivity, steroid use, possibly hypokalemia.  We will check BMP today as well as sed rate and CRP  5. Chronic pain of both shoulders Assume PMR but checking inflammatory markers as well   Alain Honey, MD [place] [phone]

## 2022-04-02 LAB — BASIC METABOLIC PANEL WITH GFR
BUN/Creatinine Ratio: 26 (calc) — ABNORMAL HIGH (ref 6–22)
BUN: 41 mg/dL — ABNORMAL HIGH (ref 7–25)
CO2: 27 mmol/L (ref 20–32)
Calcium: 9.9 mg/dL (ref 8.6–10.4)
Chloride: 97 mmol/L — ABNORMAL LOW (ref 98–110)
Creat: 1.55 mg/dL — ABNORMAL HIGH (ref 0.60–1.00)
Glucose, Bld: 86 mg/dL (ref 65–99)
Potassium: 3.3 mmol/L — ABNORMAL LOW (ref 3.5–5.3)
Sodium: 138 mmol/L (ref 135–146)
eGFR: 35 mL/min/{1.73_m2} — ABNORMAL LOW (ref >=60)

## 2022-04-02 LAB — C-REACTIVE PROTEIN: CRP: 0.5 mg/L (ref <8.0)

## 2022-04-02 LAB — SEDIMENTATION RATE: Sed Rate: 34 mm/h — ABNORMAL HIGH (ref 0–30)

## 2022-04-03 ENCOUNTER — Telehealth: Payer: Self-pay | Admitting: *Deleted

## 2022-04-03 ENCOUNTER — Encounter: Payer: Self-pay | Admitting: Family Medicine

## 2022-04-03 NOTE — Telephone Encounter (Signed)
Patient called and stated that Dr. Sabra Heck told her that she needed repeat labwork done after taking the Prednisone. Patient was seen on 8/16. Wasn't given another appointment for labs and there are no orders in Epic for repeat labwork to be done nor when.   Please Advise.

## 2022-04-03 NOTE — Telephone Encounter (Signed)
Spoke with Dr. Sabra Heck and he would like for patient to schedule a Telephone Visit to follow up next week.   MyChart message was sent to office from husband through patient's MyChart. Message sent back requesting them to schedule an appointment to follow up next week.

## 2022-04-03 NOTE — Telephone Encounter (Signed)
Patient called back frustrated that there are no orders or appointment for her to have follow up lab work

## 2022-04-06 NOTE — Telephone Encounter (Signed)
Megan Honour, MD  You 1 hour ago (9:53 AM)    Orders will be dependent on response to treatment     MyChart Appointment scheduled with Dr. Sabra Heck.

## 2022-04-07 ENCOUNTER — Other Ambulatory Visit: Payer: Self-pay | Admitting: Nurse Practitioner

## 2022-04-08 ENCOUNTER — Telehealth (INDEPENDENT_AMBULATORY_CARE_PROVIDER_SITE_OTHER): Payer: Medicare PPO | Admitting: Family Medicine

## 2022-04-08 ENCOUNTER — Telehealth: Payer: Medicare PPO | Admitting: Internal Medicine

## 2022-04-08 DIAGNOSIS — D801 Nonfamilial hypogammaglobulinemia: Secondary | ICD-10-CM

## 2022-04-08 DIAGNOSIS — M353 Polymyalgia rheumatica: Secondary | ICD-10-CM

## 2022-04-08 NOTE — Progress Notes (Signed)
Provider:  Alain Honey, MD  Careteam: Patient Care Team: [name], NP as PCP - General (Geriatric Medicine) Minus Breeding, MD as PCP - Cardiology (Cardiology) Alda Berthold, DO as Consulting Physician (Neurology)  PLACE OF SERVICE:  [place]  Advanced Directive information    Allergies  Allergen Reactions   Imitrex [Sumatriptan] Nausea And Vomiting    Ringing in the ears, migraines are worse   Tetracycline Nausea Only    Causes ringing in ears, dizziness, migraine, nausea   Corticosteroids Other (See Comments)    12/02/2016 interview by Melissa Montane: Oral dosing causes migraines/nausea/tinnitus. Prev tolerated IV and intranasal admin w/o difficulty.   Cefixime Other (See Comments)    Headache    Ciprofloxacin Other (See Comments)    Headache, dizziness, ringing in the ears   Doxycycline Other (See Comments)    unknown   Gabapentin     Throat swells/closes   Isometheptene-Dichloral-Apap Other (See Comments)    unknown   Ketorolac     12/02/2016 interview by TFT: Oral dosing prednisone/corticosteroids causes migraines/nausea/tinnitus. Prev tolerated IV and intranasal admin w/o difficulty.   Prednisone Other (See Comments)    Migraine, dizzy, ringing in ears-can take it IV 12/02/2016 interview by [name]: Oral prednisone dosing causes migraines/nausea/tinnitus. Prev tolerated IV and intranasal admin w/o difficulty.   Promethazine Other (See Comments)    causes severe abdominal pain when taken IV; however she can take PO or IM   Stadol [Butorphanol]     Cerebral pain   Tape Other (See Comments)    Adhesive tapes-patient denies   Erythromycin Base Diarrhea and Itching    Severe diarrhea   Propoxyphene Nausea Only    No chief complaint on file.  .Virtual Visit via Telephone  I connected with patient 04/08/22 at  by telephone and verified that I am speaking with the correct person using two identifiers.   Location: [place] office Patient: home Provider: [facility]  clinic    I discussed the limitations, risks, security and privacy concerns of performing an evaluation and management service by telephone and the availability of in person appointments. I also discussed with the patient that there may be a patient responsible charge related to this service. The patient expressed understanding and agreed to proceed.    I discussed the assessment and treatment plan with the patient. The patient was provided an opportunity to ask questions and all were answered. The patient agreed with the plan and demonstrated an understanding of the instructions.     The patient was advised to call back or seek an in-person evaluation if the symptoms worsen or if the condition fails to improve as anticipated.   I provided 10 minutes of non-face-to-face time during this encounter   HPI: Patient is a 76 y.o. female reports feeling much better.  On the telephone she sounds brighter and perkier.  Symptoms have almost resolved but not totally.  We discussed blood work that she had 1 week ago sed rate was only minimally elevated at 34 with normal being up to 30 and C-reactive protein was totally normal.  I suggested that we repeat lab work in 1 week and will probably need to repeat the Depo-Medrol injection as well.  I believe that symptoms need to be treated for anywhere from 2 to 4 weeks before we can discontinue.  As I explained to her, it is hard to know exactly how fast the injection is metabolized in her system and we have to base repeat injections  on her symptoms.  Hopefully that will suffice. She did not get an appointment with rheumatology until next year so that will not be but a formal rheumatology consult called and asked that she be seen by hematology.  This is probably in reference to her immunodeficient issue.  Review of Systems:  Review of Systems  Constitutional:  Negative for chills, fever and malaise/fatigue.  Respiratory: Negative.    Cardiovascular: Negative.    Musculoskeletal:  Positive for joint pain and myalgias.  Neurological:  Negative for dizziness.  All other systems reviewed and are negative.   Past Medical History:  Diagnosis Date   Anemia    Arachnoiditis    Bipolar disorder (HCC)    C. 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[place])    Lung cancer ([place]) 2011, 2014   Memory loss    Migraine    Opioid abuse ([place])    Osteoporosis    Presence of neurostimulator    Restless legs    SIRS (systemic inflammatory response syndrome) ([place])    Sleep apnea    Past Surgical History:  Procedure Laterality Date   ABDOMINAL HYSTERECTOMY     CARPAL TUNNEL RELEASE     CATARACT EXTRACTION Bilateral 2019   CHOLECYSTECTOMY     COLONOSCOPY  2015   [facility]   CT LUNG SCREENING  2018   DENTAL SURGERY  06/17/2021   4 teeth rmoved   DG  BONE DENSITY ([place] HX)  2018   DIAGNOSTIC MAMMOGRAM  2019   GASTRIC BYPASS     LUNG CANCER SURGERY  02/2010   MULTIPLE TOOTH EXTRACTIONS     PORT A CATH REVISION Right 04/2021   PORT-A-CATH REMOVAL Left 02/07/2021   Procedure: REMOVAL PORT-A-CATH;  Surgeon: Kieth Brightly Arta Bruce, MD;  Location: [facility];  Service: General;  Laterality: Left;  60 MINUTES ROOM 4   SPINAL CORD STIMULATOR IMPLANT     Social History:   reports that she has never smoked. She has never used smokeless tobacco. She reports that she does not currently use alcohol. She reports that she does not use drugs.  Family History  Problem Relation Age of Onset   Hypertension Mother    GER disease Mother    Dementia Mother    Osteoporosis Mother    Arthritis Mother    Bipolar disorder Mother    Parkinson's disease Father    Congestive Heart Failure Father    Pneumonia Father    Arthritis Father     Dementia Father    Depression Father    Asthma Sister    Depression Sister    Bipolar disorder Brother    Asthma Daughter    Colon cancer Neg Hx    Esophageal cancer Neg Hx    Stomach cancer Neg Hx    Rectal cancer Neg Hx     Medications: Patient's Medications  New Prescriptions   No medications on file  Previous Medications   ACETAMINOPHEN (TYLENOL) 500 MG TABLET    Take 500 mg by mouth every 6 (six) hours as needed for moderate pain.   AZELASTINE (ASTELIN) 0.1 % NASAL SPRAY    Place 2 sprays into both nostrils 2 (two) times daily.   BACLOFEN (LIORESAL)  10 MG TABLET    Take 10 mg by mouth daily as needed for muscle spasms.   BUPROPION (WELLBUTRIN XL) 150 MG 24 HR TABLET    Take 1 tablet (150 mg total) by mouth daily.   CALCIUM PO    Take 1 tablet by mouth in the morning and at bedtime.   CARBIDOPA-LEVODOPA (SINEMET IR) 25-100 MG TABLET    Take as needed for severe restless leg syndrome.   CHOLECALCIFEROL (VITAMIN D3) 25 MCG (1000 UNIT) TABLET    Take 1,000 Units by mouth daily.   CYCLOSPORINE (RESTASIS) 0.05 % OPHTHALMIC EMULSION    Place 1 drop into both eyes in the morning, at noon, in the evening, and at bedtime.   DEXAMETHASONE (DECADRON PO)    Take 1 tablet by mouth. Every 21 days   DEXLANSOPRAZOLE (DEXILANT) 60 MG CAPSULE    Take 1 capsule (60 mg total) by mouth daily. **PLEASE KEEP APPOINTMENT FOR FURTHER REFILLS   FAMOTIDINE (PEPCID) 20 MG TABLET    One after supper   FERROUS SULFATE 325 (65 FE) MG TABLET    Take 650 mg by mouth daily with breakfast.   GAMMAGARD 20 GM/200ML SOLN    Inject into the vein. Every 21 days   HEPARIN LOCK FLUSH 100 UNIT/ML SOLN INJECTION    Inject 500 Units into the vein every 21 ( twenty-one) days.   HYDROCODONE-ACETAMINOPHEN (NORCO) 5-325 MG TABLET    Take 1 tablet by mouth every 6 (six) hours as needed for moderate pain.   HYDROXYZINE (VISTARIL) 25 MG CAPSULE    Take 25 mg by mouth at bedtime.   IBUPROFEN (ADVIL) 200 MG TABLET    Take 600 mg  by mouth as needed for mild pain.   IMMUNE GLOBULIN, HUMAN, (GAMMAGARD S/D LESS IGA) 10 G INJECTION    Inject 2 g/kg into the vein every 21 ( twenty-one) days.   LAMOTRIGINE (LAMICTAL) 200 MG TABLET    Take 1 tablet (200 mg total) by mouth at bedtime.   LEVOTHYROXINE (SYNTHROID) 75 MCG TABLET    TAKE ONE (1) TABLET BY MOUTH EACH DAY 30MINUTES BEFORE BREAKFAST ON EMPTY STOMACH   MAGNESIUM OXIDE (MAG-OX) 400 MG TABLET    Take 400 mg by mouth daily.    MELATONIN 5 MG TABS    Take 5 mg by mouth.   MIRTAZAPINE (REMERON) 30 MG TABLET    Take 1 tablet (30 mg total) by mouth at bedtime.   MUPIROCIN OINTMENT (BACTROBAN) 2 %    Apply 1 application. topically 2 (two) times daily.   ONDANSETRON (ZOFRAN) 4 MG TABLET    Take 1 tablet (4 mg total) by mouth every 8 (eight) hours as needed for nausea or vomiting.   OXYBUTYNIN (DITROPAN) 5 MG/5ML SYRUP    TAKE 2.5 MLS BY MOUTH TWICE A DAY   POTASSIUM CHLORIDE (KLOR-CON) 10 MEQ TABLET    Take 1 tablet (10 mEq total) by mouth 4 (four) times daily.   ROPINIROLE (REQUIP) 1 MG TABLET    Take 0.5 tablet at 2p and 1.5 tablet 2-3 hour prior to bedtime.   SODIUM CHLORIDE, PF, 0.9 % INJECTION    Inject 5-10 mLs into the vein as needed (Immunoglobulin).   TAMSULOSIN (FLOMAX) 0.4 MG CAPS CAPSULE    Take 1 capsule (0.4 mg total) by mouth daily.   TORSEMIDE (DEMADEX) 20 MG TABLET    Take 20 mg by mouth daily.   TRIAMCINOLONE CREAM (KENALOG) 0.1 %    Apply 1 application topically  2 (two) times daily as needed (rash).   UNABLE TO FIND    Take 1 tablet by mouth daily. Med Name: [name] 1 by mouth daily for UTI prevention   VITAMIN B-12 (CYANOCOBALAMIN) 1000 MCG TABLET    TAKE 1 TABLET BY MOUTH DAILY   ZINC OXIDE, TOPICAL, 10 % CREA    Apply 1 application topically 2 (two) times daily as needed (rash).  Modified Medications   No medications on file  Discontinued Medications   No medications on file    Physical Exam:  There were no vitals filed for this visit. There is no  height or weight on file to calculate BMI. Wt Readings from Last 3 Encounters:  03/24/22 140 lb (63.5 kg)  03/23/22 140 lb (63.5 kg)  12/29/21 142 lb (64.4 kg)    Physical Exam Constitutional:      Comments: This was a telephonic visit and patient was not examined     Labs reviewed: Basic Metabolic Panel: Recent Labs    06/26/21 1607 08/14/21 1059 03/08/22 1242 03/24/22 1503 04/01/22 0917  NA 139   < > 137 140 138  K 4.6   < > 3.0* 3.1* 3.3*  CL 104   < > 102 106 97*  CO2 23   < > 25 27 27   GLUCOSE 100*   < > 127* 100* 86  BUN 28*   < > 33* 32* 41*  CREATININE 1.12   < > 1.37* 1.52* 1.55*  CALCIUM 9.1   < > 9.2 9.4 9.9  MG  --   --  1.8  --   --   TSH 4.42  --   --   --   --    < > = values in this interval not displayed.   Liver Function Tests: Recent Labs    06/23/21 1056 03/08/22 1242  AST 20 28  ALT 14 25  ALKPHOS 73 81  BILITOT 0.3 0.7  PROT 7.1 7.5  ALBUMIN 3.7 3.5   Recent Labs    03/08/22 1242  LIPASE 52*   No results for input(s): "AMMONIA" in the last 8760 hours. CBC: Recent Labs    06/23/21 1056 06/26/21 1607 03/08/22 1242 03/24/22 1503  WBC 8.5 7.6 6.9 7.8  NEUTROABS 6.4 5.1 4.8  --   HGB 14.1 14.0 13.5 12.5  HCT 41.7 42.0 39.8 36.8  MCV 87.8 88.7 87.5 88.0  PLT 219 245.0 189 190   Lipid Panel: No results for input(s): "CHOL", "HDL", "LDLCALC", "TRIG", "CHOLHDL", "LDLDIRECT" in the last 8760 hours. TSH: Recent Labs    06/26/21 1607  TSH 4.42   A1C: No results found for: "HGBA1C"   Assessment/Plan  1. Hypogammaglobulinemia [facility]) Referral to hematology made at request of rheumatology  2. Polymyalgia rheumatica ([place]) This diagnosis is again presumed based on symptoms and initial lab reports from an ER visit in [place].  We will continue with steroids and monitoring blood work and symptoms as described in HPI, above   Alain Honey, MD [place] [phone]

## 2022-04-11 ENCOUNTER — Other Ambulatory Visit: Payer: Self-pay | Admitting: Neurology

## 2022-04-13 ENCOUNTER — Telehealth: Payer: Self-pay

## 2022-04-13 NOTE — Telephone Encounter (Signed)
Patient's husband called stating that patient is suppose to have another sed rate done and patient also needs another cortisone injection. There isn't an order for any labs. When would you like for her to come in to have labs drawn and can you place order. When should she come in for cortisone injection?  Message routed to Dr. Alain Honey

## 2022-04-14 ENCOUNTER — Other Ambulatory Visit: Payer: Self-pay | Admitting: Family Medicine

## 2022-04-14 ENCOUNTER — Other Ambulatory Visit: Payer: Self-pay

## 2022-04-14 ENCOUNTER — Other Ambulatory Visit: Payer: Medicare PPO

## 2022-04-14 DIAGNOSIS — M353 Polymyalgia rheumatica: Secondary | ICD-10-CM

## 2022-04-15 ENCOUNTER — Ambulatory Visit (INDEPENDENT_AMBULATORY_CARE_PROVIDER_SITE_OTHER): Payer: Medicare PPO | Admitting: Family Medicine

## 2022-04-15 ENCOUNTER — Encounter: Payer: Medicare PPO | Admitting: Family

## 2022-04-15 ENCOUNTER — Encounter: Payer: Self-pay | Admitting: Family Medicine

## 2022-04-15 ENCOUNTER — Encounter: Payer: Self-pay | Admitting: Family

## 2022-04-15 VITALS — BP 110/70 | HR 78 | Temp 97.1°F | Resp 16 | Ht 64.0 in | Wt 145.6 lb

## 2022-04-15 DIAGNOSIS — M353 Polymyalgia rheumatica: Secondary | ICD-10-CM | POA: Diagnosis not present

## 2022-04-15 LAB — SEDIMENTATION RATE: Sed Rate: 31 mm/h — ABNORMAL HIGH (ref 0–30)

## 2022-04-15 MED ORDER — METHYLPREDNISOLONE ACETATE 40 MG/ML IJ SUSP
40.0000 mg | Freq: Once | INTRAMUSCULAR | Status: AC
  Administered 2022-04-15: 40 mg via INTRAMUSCULAR

## 2022-04-15 NOTE — Progress Notes (Signed)
Provider:  Alain Honey, MD  Careteam: Patient Care Team: [name], NP as PCP - General (Geriatric Medicine) Minus Breeding, MD as PCP - Cardiology (Cardiology) Alda Berthold, DO as Consulting Physician (Neurology)  PLACE OF SERVICE:  [place]  Advanced Directive information    Allergies  Allergen Reactions   Imitrex [Sumatriptan] Nausea And Vomiting    Ringing in the ears, migraines are worse   Tetracycline Nausea Only    Causes ringing in ears, dizziness, migraine, nausea   Corticosteroids Other (See Comments)    12/02/2016 interview by Melissa Montane: Oral dosing causes migraines/nausea/tinnitus. Prev tolerated IV and intranasal admin w/o difficulty.   Cefixime Other (See Comments)    Headache    Ciprofloxacin Other (See Comments)    Headache, dizziness, ringing in the ears   Doxycycline Other (See Comments)    unknown   Gabapentin     Throat swells/closes   Isometheptene-Dichloral-Apap Other (See Comments)    unknown   Ketorolac     12/02/2016 interview by TDH: Oral dosing prednisone/corticosteroids causes migraines/nausea/tinnitus. Prev tolerated IV and intranasal admin w/o difficulty.   Prednisone Other (See Comments)    Migraine, dizzy, ringing in ears-can take it IV 12/02/2016 interview by [name]: Oral prednisone dosing causes migraines/nausea/tinnitus. Prev tolerated IV and intranasal admin w/o difficulty.   Promethazine Other (See Comments)    causes severe abdominal pain when taken IV; however she can take PO or IM   Stadol [Butorphanol]     Cerebral pain   Tape Other (See Comments)    Adhesive tapes-patient denies   Erythromycin Base Diarrhea and Itching    Severe diarrhea   Propoxyphene Nausea Only    Chief Complaint  Patient presents with   Acute Visit    Patient presents today for a      HPI: Patient is a 76 y.o. female patient presents today for another injection of Depo-Medrol.  Her history has been outlined previously but briefly she  is new onset of polymyalgia, presumed.  She could not take oral prednisone as is the usual treatment due to many side effects.  She has tolerated parenteral Depo-Medrol with relief of symptoms.  Because the injection has variable rate of metabolism.  Her symptoms which had improved have seem to return.  My thought would be to move the injections closer together, that his from 14 days to 10 days.  At the same time, her sed rate is following which is one of the goals of treatment.  And I would assume we could reduce the dose of Depo-Medrol from 40 mg to 20 mg, depending on symptom relief.  We will continue to follow the sed rate until it is completely normal.  Review of Systems:  Review of Systems  Constitutional:  Positive for malaise/fatigue.  Musculoskeletal:  Positive for myalgias and neck pain.  All other systems reviewed and are negative.   Past Medical History:  Diagnosis Date   Anemia    Arachnoiditis    Bipolar disorder (HCC)    C. 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[place])    Lung cancer ([place]) 2011, 2014   Memory loss    Migraine    Opioid abuse ([place])  Osteoporosis    Presence of neurostimulator    Restless legs    SIRS (systemic inflammatory response syndrome) ([place])    Sleep apnea    Past Surgical History:  Procedure Laterality Date   ABDOMINAL HYSTERECTOMY     CARPAL TUNNEL RELEASE     CATARACT EXTRACTION Bilateral 2019   CHOLECYSTECTOMY     COLONOSCOPY  2015   [facility]   CT LUNG SCREENING  2018   DENTAL SURGERY  06/17/2021   4 teeth rmoved   DG  BONE DENSITY ([place] HX)  2018   DIAGNOSTIC MAMMOGRAM  2019   GASTRIC BYPASS     LUNG CANCER SURGERY  02/2010   MULTIPLE TOOTH EXTRACTIONS     PORT A CATH REVISION Right  04/2021   PORT-A-CATH REMOVAL Left 02/07/2021   Procedure: REMOVAL PORT-A-CATH;  Surgeon: Kieth Brightly Arta Bruce, MD;  Location: [facility];  Service: General;  Laterality: Left;  60 MINUTES ROOM 4   SPINAL CORD STIMULATOR IMPLANT     Social History:   reports that she has never smoked. She has never used smokeless tobacco. She reports that she does not currently use alcohol. She reports that she does not use drugs.  Family History  Problem Relation Age of Onset   Hypertension Mother    GER disease Mother    Dementia Mother    Osteoporosis Mother    Arthritis Mother    Bipolar disorder Mother    Parkinson's disease Father    Congestive Heart Failure Father    Pneumonia Father    Arthritis Father    Dementia Father    Depression Father    Asthma Sister    Depression Sister    Bipolar disorder Brother    Asthma Daughter    Colon cancer Neg Hx    Esophageal cancer Neg Hx    Stomach cancer Neg Hx    Rectal cancer Neg Hx     Medications: Patient's Medications  New Prescriptions   No medications on file  Previous Medications   ACETAMINOPHEN (TYLENOL) 500 MG TABLET    Take 500 mg by mouth every 6 (six) hours as needed for moderate pain.   AZELASTINE (ASTELIN) 0.1 % NASAL SPRAY    Place 2 sprays into both nostrils 2 (two) times daily.   BACLOFEN (LIORESAL) 10 MG TABLET    Take 10 mg by mouth daily as needed for muscle spasms.   BUPROPION (WELLBUTRIN XL) 150 MG 24 HR TABLET    Take 1 tablet (150 mg total) by mouth daily.   CALCIUM PO    Take 1 tablet by mouth in the morning and at bedtime.   CARBIDOPA-LEVODOPA (SINEMET IR) 25-100 MG TABLET    TAKE AS NEEDED FOR ESVERE RESTLESS LEG SYNDROME   CHOLECALCIFEROL (VITAMIN D3) 25 MCG (1000 UNIT) TABLET    Take 1,000 Units by mouth daily.   CYCLOSPORINE (RESTASIS) 0.05 % OPHTHALMIC EMULSION    Place 1 drop into both eyes in the morning, at noon, in the evening, and at bedtime.   DEXLANSOPRAZOLE (DEXILANT) 60 MG CAPSULE    Take 1 capsule (60 mg  total) by mouth daily. **PLEASE KEEP APPOINTMENT FOR FURTHER REFILLS   FAMOTIDINE (PEPCID) 20 MG TABLET    One after supper   FERROUS SULFATE 325 (65 FE) MG TABLET    Take 650 mg by mouth daily with breakfast.   GAMMAGARD 20 GM/200ML SOLN    Inject into the vein. Every 21 days   HEPARIN LOCK FLUSH 100  UNIT/ML SOLN INJECTION    Inject 500 Units into the vein every 21 ( twenty-one) days.   HYDROCODONE-ACETAMINOPHEN (NORCO) 5-325 MG TABLET    Take 1 tablet by mouth every 6 (six) hours as needed for moderate pain.   HYDROXYZINE (VISTARIL) 25 MG CAPSULE    Take 25 mg by mouth at bedtime.   IBUPROFEN (ADVIL) 200 MG TABLET    Take 600 mg by mouth as needed for mild pain.   IMMUNE GLOBULIN, HUMAN, (GAMMAGARD S/D LESS IGA) 10 G INJECTION    Inject 2 g/kg into the vein every 21 ( twenty-one) days.   LAMOTRIGINE (LAMICTAL) 200 MG TABLET    Take 1 tablet (200 mg total) by mouth at bedtime.   LEVOTHYROXINE (SYNTHROID) 75 MCG TABLET    TAKE ONE (1) TABLET BY MOUTH EACH DAY 30MINUTES BEFORE BREAKFAST ON EMPTY STOMACH   MAGNESIUM OXIDE (MAG-OX) 400 MG TABLET    Take 400 mg by mouth daily.    MELATONIN 5 MG TABS    Take 5 mg by mouth.   MIRTAZAPINE (REMERON) 30 MG TABLET    Take 1 tablet (30 mg total) by mouth at bedtime.   MUPIROCIN OINTMENT (BACTROBAN) 2 %    Apply 1 application. topically 2 (two) times daily.   ONDANSETRON (ZOFRAN) 4 MG TABLET    Take 1 tablet (4 mg total) by mouth every 8 (eight) hours as needed for nausea or vomiting.   OXYBUTYNIN (DITROPAN) 5 MG/5ML SYRUP    TAKE 2.5 MLS BY MOUTH TWICE A DAY   POTASSIUM CHLORIDE (KLOR-CON) 10 MEQ TABLET    Take 1 tablet (10 mEq total) by mouth 4 (four) times daily.   ROPINIROLE (REQUIP) 1 MG TABLET    Take 0.5 tablet at 2p and 1.5 tablet 2-3 hour prior to bedtime.   SODIUM CHLORIDE, PF, 0.9 % INJECTION    Inject 5-10 mLs into the vein as needed (Immunoglobulin).   TAMSULOSIN (FLOMAX) 0.4 MG CAPS CAPSULE    Take 1 capsule (0.4 mg total) by mouth daily.    TORSEMIDE (DEMADEX) 20 MG TABLET    Take 20 mg by mouth daily.   TRIAMCINOLONE CREAM (KENALOG) 0.1 %    Apply 1 application topically 2 (two) times daily as needed (rash).   UNABLE TO FIND    Take 1 tablet by mouth daily. Med Name: [name] 1 by mouth daily for UTI prevention   VITAMIN B-12 (CYANOCOBALAMIN) 1000 MCG TABLET    TAKE 1 TABLET BY MOUTH DAILY   ZINC OXIDE, TOPICAL, 10 % CREA    Apply 1 application topically 2 (two) times daily as needed (rash).  Modified Medications   No medications on file  Discontinued Medications   No medications on file    Physical Exam:  Vitals:   04/15/22 0934  BP: 110/70  Pulse: 78  Resp: 16  Temp: (!) 97.1 F (36.2 C)  SpO2: 98%  Weight: 145 lb 9.6 oz (66 kg)  Height: 5\' 4"  (1.626 m)   Body mass index is 24.99 kg/m. Wt Readings from Last 3 Encounters:  04/15/22 145 lb 9.6 oz (66 kg)  04/15/22 145 lb 6.4 oz (66 kg)  03/24/22 140 lb (63.5 kg)    Physical Exam Vitals and nursing note reviewed.  Constitutional:      Appearance: She is ill-appearing.  Neck:     Comments: Neck is supple but there is tenderness with palpation around the neck and shoulders Cardiovascular:     Rate and Rhythm: Normal  rate.  Pulmonary:     Effort: Pulmonary effort is normal.  Neurological:     General: No focal deficit present.     Mental Status: She is alert and oriented to person, place, and time.     Labs reviewed: Basic Metabolic Panel: Recent Labs    06/26/21 1607 08/14/21 1059 03/08/22 1242 03/24/22 1503 04/01/22 0917  NA 139   < > 137 140 138  K 4.6   < > 3.0* 3.1* 3.3*  CL 104   < > 102 106 97*  CO2 23   < > 25 27 27   GLUCOSE 100*   < > 127* 100* 86  BUN 28*   < > 33* 32* 41*  CREATININE 1.12   < > 1.37* 1.52* 1.55*  CALCIUM 9.1   < > 9.2 9.4 9.9  MG  --   --  1.8  --   --   TSH 4.42  --   --   --   --    < > = values in this interval not displayed.   Liver Function Tests: Recent Labs    06/23/21 1056 03/08/22 1242  AST 20 28   ALT 14 25  ALKPHOS 73 81  BILITOT 0.3 0.7  PROT 7.1 7.5  ALBUMIN 3.7 3.5   Recent Labs    03/08/22 1242  LIPASE 52*   No results for input(s): "AMMONIA" in the last 8760 hours. CBC: Recent Labs    06/23/21 1056 06/26/21 1607 03/08/22 1242 03/24/22 1503  WBC 8.5 7.6 6.9 7.8  NEUTROABS 6.4 5.1 4.8  --   HGB 14.1 14.0 13.5 12.5  HCT 41.7 42.0 39.8 36.8  MCV 87.8 88.7 87.5 88.0  PLT 219 245.0 189 190   Lipid Panel: No results for input(s): "CHOL", "HDL", "LDLCALC", "TRIG", "CHOLHDL", "LDLDIRECT" in the last 8760 hours. TSH: Recent Labs    06/26/21 1607  TSH 4.42   A1C: No results found for: "HGBA1C"   Assessment/Plan  1. PMR (polymyalgia rheumatica) ([place]) Plan as outlined in the HPI above.  General plan with polymyalgia is steroids for 2 to 4 weeks following sed rate and symptoms.  By my estimation we are 3 weeks into her course of treatment and could probably begin to taper steroids at next visit which will be in about 10 days.  I do note in looking over her problem list today however that she has had chronic pains in her shoulders and neck before which deformity brings into question this diagnosis of acute polymyalgia      Alain Honey, MD [place] [phone]

## 2022-04-15 NOTE — Progress Notes (Signed)
This encounter was created in error - please disregard. Visit switched to Dr.Miller for Cortisol injection.

## 2022-04-21 ENCOUNTER — Encounter (HOSPITAL_BASED_OUTPATIENT_CLINIC_OR_DEPARTMENT_OTHER): Payer: Self-pay | Admitting: Pediatrics

## 2022-04-21 ENCOUNTER — Emergency Department (HOSPITAL_BASED_OUTPATIENT_CLINIC_OR_DEPARTMENT_OTHER): Payer: Medicare PPO

## 2022-04-21 ENCOUNTER — Emergency Department (HOSPITAL_BASED_OUTPATIENT_CLINIC_OR_DEPARTMENT_OTHER)
Admission: EM | Admit: 2022-04-21 | Discharge: 2022-04-21 | Disposition: A | Discharge status: Home / Self Care | Payer: Medicare PPO | Attending: Emergency Medicine | Admitting: Emergency Medicine

## 2022-04-21 ENCOUNTER — Other Ambulatory Visit: Payer: Self-pay

## 2022-04-21 DIAGNOSIS — M542 Cervicalgia: Secondary | ICD-10-CM | POA: Diagnosis not present

## 2022-04-21 DIAGNOSIS — R519 Headache, unspecified: Secondary | ICD-10-CM | POA: Diagnosis not present

## 2022-04-21 DIAGNOSIS — G8929 Other chronic pain: Secondary | ICD-10-CM | POA: Diagnosis not present

## 2022-04-21 MED ORDER — METHYLPREDNISOLONE SODIUM SUCC 40 MG IJ SOLR
40.0000 mg | Freq: Once | INTRAMUSCULAR | Status: DC
Start: 2022-04-21 — End: 2022-04-21
  Filled 2022-04-21: qty 1

## 2022-04-21 MED ORDER — HYDROCODONE-ACETAMINOPHEN 5-325 MG PO TABS
1.0000 | ORAL_TABLET | Freq: Once | ORAL | Status: AC
  Administered 2022-04-21: 1 via ORAL
  Filled 2022-04-21: qty 1

## 2022-04-21 MED ORDER — METHYLPREDNISOLONE SODIUM SUCC 40 MG IJ SOLR
40.0000 mg | INTRAMUSCULAR | Status: AC
  Administered 2022-04-21: 40 mg via INTRAMUSCULAR

## 2022-04-21 NOTE — ED Triage Notes (Signed)
C/O severe head, neck, shoulder and wrist pain; reported hx of PMR and awaiting a new rheumatologist consult.

## 2022-04-21 NOTE — Discharge Instructions (Signed)
Call Dr. Ammie Ferrier office tomorrow to schedule recheck  Let them know we gave you a dosage of solumedrol today

## 2022-04-21 NOTE — ED Provider Notes (Signed)
[facility] Provider Note   CSN: [id number] Arrival date & time: 04/21/22  1444     History  Chief Complaint  Patient presents with   Neck Pain    Megan Brewer is a 76 y.o. female.  Patient complains of pain in her neck pain in her back.  She saw her primary care physician on August 30 and was given an injection of steroids.  Patient reports approximately 4 weeks ago she was diagnosed with Polymyalgia rheumatica.  Patient reports she was seen in an emergency department in [place].  Pt was started on prednisone but she has been unable to tolerate oral medications.  Pt is unable to see Rheumatology until March.  Pt reports previous injection helped with pain however last injection did not help Pt reports the last dose of medication was less than previous dose.    The history is provided by the patient. No language interpreter was used.  Neck Pain Pain location:  Generalized neck Quality:  Aching Pain radiates to:  Does not radiate Pain severity:  Moderate Onset quality:  Gradual Duration:  4 weeks Timing:  Constant Progression:  Worsening Chronicity:  New Relieved by:  Nothing Worsened by:  Nothing Ineffective treatments:  None tried Associated symptoms: no weakness   Risk factors: no recurrent falls        Home Medications Prior to Admission medications   Medication Sig Start Date End Date Taking? Authorizing Provider  acetaminophen (TYLENOL) 500 MG tablet Take 500 mg by mouth every 6 (six) hours as needed for moderate pain.    [provider]  azelastine (ASTELIN) 0.1 % nasal spray Place 2 sprays into both nostrils 2 (two) times daily. 03/10/21   Dara Hoyer, FNP  baclofen (LIORESAL) 10 MG tablet Take 10 mg by mouth daily as needed for muscle spasms. 06/03/20   [provider]  buPROPion (WELLBUTRIN XL) 150 MG 24 hr tablet Take 1 tablet (150 mg total) by mouth daily. 10/10/21   Thayer Headings, PMHNP  CALCIUM PO  Take 1 tablet by mouth in the morning and at bedtime.    [provider]  carbidopa-levodopa (SINEMET IR) 25-100 MG tablet TAKE AS NEEDED FOR ESVERE RESTLESS LEG SYNDROME 04/13/22   Narda Amber K, DO  cholecalciferol (VITAMIN D3) 25 MCG (1000 UNIT) tablet Take 1,000 Units by mouth daily.    [provider]  cycloSPORINE (RESTASIS) 0.05 % ophthalmic emulsion Place 1 drop into both eyes in the morning, at noon, in the evening, and at bedtime.    [provider]  dexlansoprazole (DEXILANT) 60 MG capsule Take 1 capsule (60 mg total) by mouth daily. **PLEASE KEEP APPOINTMENT FOR FURTHER REFILLS 03/31/22   Esterwood, Amy S, PA-C  famotidine (PEPCID) 20 MG tablet One after supper 06/26/21   Tanda Rockers, MD  ferrous sulfate 325 (65 FE) MG tablet Take 650 mg by mouth daily with breakfast.    [provider]  GAMMAGARD 20 GM/200ML SOLN Inject into the vein. Every 21 days 02/27/22   [provider]  heparin lock flush 100 UNIT/ML SOLN injection Inject 500 Units into the vein every 21 ( twenty-one) days. 03/31/21   [provider]  HYDROcodone-acetaminophen (NORCO) 5-325 MG tablet Take 1 tablet by mouth every 6 (six) hours as needed for moderate pain. 04/01/22   Wardell Honour, MD  hydrOXYzine (VISTARIL) 25 MG capsule Take 25 mg by mouth at bedtime. 03/07/22   [provider]  ibuprofen (ADVIL) 200 MG tablet Take 600 mg by mouth as needed for mild pain.    [provider]  immune globulin, human, (GAMMAGARD S/D LESS IGA) 10 g injection Inject 2 g/kg into the vein every 21 ( twenty-one) days.    [provider]  lamoTRIgine (LAMICTAL) 200 MG tablet Take 1 tablet (200 mg total) by mouth at bedtime. 10/10/21 03/08/22  Thayer Headings, PMHNP  levothyroxine (SYNTHROID) 75 MCG tablet TAKE ONE (1) TABLET BY MOUTH EACH DAY 30MINUTES BEFORE BREAKFAST ON EMPTY STOMACH 03/12/22   [name], NP  magnesium oxide (MAG-OX) 400 MG tablet  Take 400 mg by mouth daily.     [provider]  melatonin 5 MG TABS Take 5 mg by mouth.    [provider]  mirtazapine (REMERON) 30 MG tablet Take 1 tablet (30 mg total) by mouth at bedtime. 08/28/21 03/08/22  Thayer Headings, PMHNP  mupirocin ointment (BACTROBAN) 2 % Apply 1 application. topically 2 (two) times daily. 12/29/21   [name], NP  ondansetron (ZOFRAN) 4 MG tablet Take 1 tablet (4 mg total) by mouth every 8 (eight) hours as needed for nausea or vomiting. 04/01/22   Wardell Honour, MD  oxybutynin (DITROPAN) 5 MG/5ML syrup TAKE 2.5 MLS BY MOUTH TWICE A DAY 09/09/21   [name], NP  potassium chloride (KLOR-CON) 10 MEQ tablet Take 1 tablet (10 mEq total) by mouth 4 (four) times daily. 04/01/22   Wardell Honour, MD  rOPINIRole (REQUIP) 1 MG tablet Take 0.5 tablet at 2p and 1.5 tablet 2-3 hour prior to bedtime. 11/14/21   Narda Amber K, DO  sodium chloride, PF, 0.9 % injection Inject 5-10 mLs into the vein as needed (Immunoglobulin). 02/12/21   [provider]  tamsulosin (FLOMAX) 0.4 MG CAPS capsule Take 1 capsule (0.4 mg total) by mouth daily. 01/23/21   Shelly Coss, MD  torsemide (DEMADEX) 20 MG tablet Take 20 mg by mouth daily.    [provider]  triamcinolone cream (KENALOG) 0.1 % Apply 1 application topically 2 (two) times daily as needed (rash).    [provider]  UNABLE TO FIND Take 1 tablet by mouth daily. Med Name: [name] 1 by mouth daily for UTI prevention    [provider]  vitamin B-12 (CYANOCOBALAMIN) 1000 MCG tablet TAKE 1 TABLET BY MOUTH DAILY 02/14/21   [provider]  ZINC OXIDE, TOPICAL, 10 % CREA Apply 1 application topically 2 (two) times daily as needed (rash). 02/14/21   Aline August, MD      Allergies    Imitrex [sumatriptan], Tetracycline, Corticosteroids, Cefixime, Ciprofloxacin, Doxycycline, Gabapentin, Isometheptene-dichloral-apap, Ketorolac, Prednisone, Promethazine, Stadol  [butorphanol], Tape, Erythromycin base, and Propoxyphene    Review of Systems   Review of Systems  Musculoskeletal:  Positive for neck pain.  Neurological:  Negative for weakness.  All other systems reviewed and are negative.   Physical Exam Updated Vital Signs BP (!) 108/51 (BP Location: Left Arm)   Pulse 76   Temp 98.6 F (37 C) (Oral)   Resp 18   Ht 5\' 4"  (1.626 m)   Wt 66 kg   SpO2 99%   BMI 24.99 kg/m  Physical Exam Vitals and nursing note reviewed.  Constitutional:      Appearance: She is well-developed.  HENT:     Head: Normocephalic.  Cardiovascular:     Rate and Rhythm: Normal rate and regular rhythm.  Pulmonary:     Effort: Pulmonary effort is normal.  Abdominal:     General: There is no distension.  Musculoskeletal:        General: Normal range of motion.     Cervical back: Normal range of motion.  Neurological:     Mental Status: She is alert and oriented to person, place, and time.     ED Results / Procedures / Treatments   Labs (all labs ordered are listed, but only abnormal results are displayed) Labs Reviewed - No data to display  EKG None  Radiology CT Head Wo Contrast  Result Date: 04/21/2022 CLINICAL DATA:  Cervical compression fracture, head, neck, shoulder and wrist pain EXAM: CT HEAD WITHOUT CONTRAST CT CERVICAL SPINE WITHOUT CONTRAST TECHNIQUE: Multidetector CT imaging of the head and cervical spine was performed following the standard protocol without intravenous contrast. Multiplanar CT image reconstructions of the cervical spine were also generated. RADIATION DOSE REDUCTION: This exam was performed according to the departmental dose-optimization program which includes automated exposure control, adjustment of the mA and/or kV according to patient size and/or use of iterative reconstruction technique. COMPARISON:  CT angiography head and neck 03/08/2022 FINDINGS: CT HEAD FINDINGS Brain: No evidence of acute infarction, hemorrhage,  hydrocephalus, extra-axial collection or mass lesion/mass effect. Mild periventricular white matter hypodensity. Vascular: No hyperdense vessel or unexpected calcification. Skull: Normal. Negative for fracture or focal lesion. Sinuses/Orbits: No acute finding. Status post bilateral maxillary antrostomy. Chronic opacification of the left sphenoid sinus with bony sinus wall thickening (series 3, image 30). Other: None. CT CERVICAL SPINE FINDINGS Alignment: Normal. Skull base and vertebrae: No acute fracture. No primary bone lesion or focal pathologic process. Soft tissues and spinal canal: No prevertebral fluid or swelling. No visible canal hematoma. Disc levels: Mild disc space height loss and osteophytosis at C6-C7 with otherwise preserved disc spaces. Upper chest: Negative. Other: None. IMPRESSION: 1. No acute intracranial pathology. 2. No fracture or static subluxation of the cervical spine. 3. Mild cervical disc degenerative disease at C6-C7 with otherwise preserved disc spaces. 4. Status post bilateral maxillary antrostomy with chronic opacification of the left sphenoid sinus and bony sinus wall thickening. Correlate for sinusitis. Electronically Signed   By: Delanna Ahmadi M.D.   On: 04/21/2022 15:51   CT Cervical Spine Wo Contrast  Result Date: 04/21/2022 CLINICAL DATA:  Cervical compression fracture, head, neck, shoulder and wrist pain EXAM: CT HEAD WITHOUT CONTRAST CT CERVICAL SPINE WITHOUT CONTRAST TECHNIQUE: Multidetector CT imaging of the head and cervical spine was performed following the standard protocol without intravenous contrast. Multiplanar CT image reconstructions of the cervical spine were also generated. RADIATION DOSE REDUCTION: This exam was performed according to the departmental dose-optimization program which includes automated exposure control, adjustment of the mA and/or kV according to patient size and/or use of iterative reconstruction technique. COMPARISON:  CT angiography head and  neck 03/08/2022 FINDINGS: CT HEAD FINDINGS Brain: No evidence of acute infarction, hemorrhage, hydrocephalus, extra-axial collection or mass lesion/mass effect. Mild periventricular white matter hypodensity. Vascular: No hyperdense vessel or unexpected calcification. Skull: Normal. Negative for fracture or focal lesion. Sinuses/Orbits: No acute finding. Status post bilateral maxillary antrostomy. Chronic opacification of the left sphenoid sinus with bony sinus wall thickening (series 3, image 30). Other: None. CT CERVICAL SPINE FINDINGS Alignment: Normal. Skull base and vertebrae: No acute fracture. No primary bone lesion or focal pathologic process. Soft tissues and spinal canal: No prevertebral fluid or swelling. No visible canal hematoma. Disc levels: Mild disc space height loss and osteophytosis at C6-C7 with otherwise preserved disc spaces. Upper  chest: Negative. Other: None. IMPRESSION: 1. No acute intracranial pathology. 2. No fracture or static subluxation of the cervical spine. 3. Mild cervical disc degenerative disease at C6-C7 with otherwise preserved disc spaces. 4. Status post bilateral maxillary antrostomy with chronic opacification of the left sphenoid sinus and bony sinus wall thickening. Correlate for sinusitis. Electronically Signed   By: Delanna Ahmadi M.D.   On: 04/21/2022 15:51    Procedures Procedures    Medications Ordered in ED Medications  HYDROcodone-acetaminophen (NORCO/VICODIN) 5-325 MG per tablet 1 tablet (1 tablet Oral Given 04/21/22 1831)  methylPREDNISolone sodium succinate (SOLU-MEDROL) 40 mg/mL injection 40 mg (40 mg Intramuscular Given 04/21/22 1836)    ED Course/ Medical Decision Making/ A&P                           Medical Decision Making Pt complains of full body pain  Pt diagnosed with polymyalgia rheumatica   Amount and/or Complexity of Data Reviewed Independent Historian:     Details: Family present with pt  External Data Reviewed: notes.    Details: Dr.  Starleen Arms senior care notes reviewed  Radiology: ordered and independent interpretation performed. Decision-making details documented in ED Course.    Details: Ct head and Ct c spine  no acute abnormality   Risk Risk Details: Pt has pain medication at home.  Pt given hydrocodone  1 tablet here.  Pt given solumedrol 40 mg IM.  Pt advised to call her MD to be seen for evaltuion            Final Clinical Impression(s) / ED Diagnoses Final diagnoses:  Other chronic pain    Rx / DC Orders ED Discharge Orders     None      An After Visit Summary was printed and given to the patient.    Fransico Meadow, PA-C 04/21/22 2054    Lennice Sites, DO 04/21/22 2152

## 2022-04-22 ENCOUNTER — Ambulatory Visit: Payer: Medicare PPO | Admitting: Family Medicine

## 2022-04-22 ENCOUNTER — Telehealth: Payer: Self-pay | Admitting: Nurse Practitioner

## 2022-04-22 NOTE — Telephone Encounter (Signed)
Megan Brewer (spouse) called wanting to know if Dr. Nani Ravens would be will to take on Megan Brewer as a NP. Megan Brewer is a current pt of Dr. Nani Ravens and stated that it seems like pt's PCP is down to one day a week a the other end of [place]. He stated it would be much easier for her to come here since they reside in [facility].   Is this pt ok for NP care establishment?

## 2022-04-24 ENCOUNTER — Other Ambulatory Visit: Payer: Medicare PPO

## 2022-04-27 ENCOUNTER — Encounter: Payer: Medicare PPO | Admitting: Nurse Practitioner

## 2022-04-27 ENCOUNTER — Ambulatory Visit: Payer: Medicare PPO

## 2022-04-28 NOTE — Progress Notes (Signed)
err

## 2022-04-30 ENCOUNTER — Other Ambulatory Visit: Payer: Self-pay | Admitting: Physician Assistant

## 2022-04-30 ENCOUNTER — Other Ambulatory Visit: Payer: Self-pay | Admitting: Nurse Practitioner

## 2022-04-30 ENCOUNTER — Ambulatory Visit: Payer: Medicare PPO | Admitting: Physician Assistant

## 2022-04-30 ENCOUNTER — Other Ambulatory Visit: Payer: Self-pay | Admitting: Psychiatry

## 2022-04-30 ENCOUNTER — Encounter: Payer: Self-pay | Admitting: Physician Assistant

## 2022-04-30 VITALS — BP 112/68 | HR 91 | Ht 63.0 in | Wt 140.0 lb

## 2022-04-30 DIAGNOSIS — F3162 Bipolar disorder, current episode mixed, moderate: Secondary | ICD-10-CM

## 2022-04-30 DIAGNOSIS — R1310 Dysphagia, unspecified: Secondary | ICD-10-CM

## 2022-04-30 DIAGNOSIS — K219 Gastro-esophageal reflux disease without esophagitis: Secondary | ICD-10-CM | POA: Diagnosis not present

## 2022-04-30 DIAGNOSIS — R531 Weakness: Secondary | ICD-10-CM

## 2022-04-30 MED ORDER — DEXLANSOPRAZOLE 60 MG PO CPDR: 1.0000 | DELAYED_RELEASE_CAPSULE | Freq: Every day | ORAL | 11 refills | Status: DC

## 2022-04-30 MED ORDER — FAMOTIDINE 20 MG PO TABS: 20.0000 mg | ORAL_TABLET | Freq: Every day | ORAL | 11 refills | Status: DC

## 2022-04-30 NOTE — Progress Notes (Signed)
Agree with assessment and plan as outlined.  

## 2022-04-30 NOTE — Progress Notes (Unsigned)
Subjective:    Patient ID: Megan Brewer, female    DOB: 08-01-1946, 76 y.o.   MRN: [id number]  HPI Megan Brewer is a 76 year old white female, established with Dr. Havery Moros, who was last seen in the office in June 2022 by myself.  She comes in today for medication refills. She has history of sleep apnea, restless leg syndrome, depression, lung cancer for which she underwent resection and is status post Roux-en-Y gastric bypass.  She also has an IgG deficiency.  She had had an episode of C. difficile colitis in May 2022.  Last colonoscopy and EGD both done May 2021.  EGD pertinent for mild esophagitis, there was a small visible staple removed from the stomach.  At colonoscopy she had 1 small adenomatous polyp and is indicated for 7-year interval follow-up.  She also was found to have anal AIN in a biopsy of hypertrophied anal papillae.  She was referred to [facility] do not see any surgical notes, to confirm this follow-up. She has been on Dexilant 60 mg p.o. every morning and takes Pepcid 20 mg every afternoon. She relates having some mild dysphagia symptoms with smaller pills recently is not having any dysphagia to liquids or solid food.  Has noticed some increased reflux symptoms recently.  On further questioning though notes indicates she was supposed to be opening the Dexilant capsule prior to administration she has not been doing this.  She has been having a very difficult time recently with terrible pain that she says has been more acutely worse since early August.  She had an emergency room visit while she was in [facility] due to pain in her shoulders neck and back.  She has been on prednisone 60 mg daily for several weeks, trying to taper to 40 mg daily but increase symptomatology.  Per the notes it is felt that she probably has PMR.  Patient says that she had a very difficult time trying to get into rheumatology and actually is planning to see a new PCP next week in order to request referral  to [facility].  Mobility is severely limited and she is very uncomfortable due to ongoing pain.  Review of Systems Pertinent positive and negative review of systems were noted in the above HPI section.  All other review of systems was otherwise negative.   Outpatient Encounter Medications as of 04/30/2022  Medication Sig   acetaminophen (TYLENOL) 500 MG tablet Take 500 mg by mouth every 6 (six) hours as needed for moderate pain.   azelastine (ASTELIN) 0.1 % nasal spray Place 2 sprays into both nostrils 2 (two) times daily.   baclofen (LIORESAL) 10 MG tablet Take 10 mg by mouth daily as needed for muscle spasms.   buPROPion (WELLBUTRIN XL) 150 MG 24 hr tablet Take 1 tablet (150 mg total) by mouth daily.   CALCIUM PO Take 1 tablet by mouth in the morning and at bedtime.   carbidopa-levodopa (SINEMET IR) 25-100 MG tablet TAKE AS NEEDED FOR ESVERE RESTLESS LEG SYNDROME   cholecalciferol (VITAMIN D3) 25 MCG (1000 UNIT) tablet Take 1,000 Units by mouth daily.   cycloSPORINE (RESTASIS) 0.05 % ophthalmic emulsion Place 1 drop into both eyes in the morning, at noon, in the evening, and at bedtime.   ferrous sulfate 325 (65 FE) MG tablet Take 650 mg by mouth daily with breakfast.   GAMMAGARD 20 GM/200ML SOLN Inject into the vein. Every 21 days   heparin lock flush 100 UNIT/ML SOLN injection Inject 500 Units into  the vein every 21 ( twenty-one) days.   HYDROcodone-acetaminophen (NORCO) 5-325 MG tablet Take 1 tablet by mouth every 6 (six) hours as needed for moderate pain.   hydrOXYzine (VISTARIL) 25 MG capsule Take 25 mg by mouth at bedtime.   ibuprofen (ADVIL) 200 MG tablet Take 600 mg by mouth as needed for mild pain.   immune globulin, human, (GAMMAGARD S/D LESS IGA) 10 g injection Inject 2 g/kg into the vein every 21 ( twenty-one) days.   levothyroxine (SYNTHROID) 75 MCG tablet TAKE ONE (1) TABLET BY MOUTH EACH DAY 30MINUTES BEFORE BREAKFAST ON EMPTY STOMACH   magnesium oxide (MAG-OX)  400 MG tablet Take 400 mg by mouth daily.    melatonin 5 MG TABS Take 5 mg by mouth.   mupirocin ointment (BACTROBAN) 2 % Apply 1 application. topically 2 (two) times daily.   ondansetron (ZOFRAN) 4 MG tablet Take 1 tablet (4 mg total) by mouth every 8 (eight) hours as needed for nausea or vomiting.   oxybutynin (DITROPAN) 5 MG tablet Take 2.5 mg by mouth 2 (two) times daily.   oxybutynin (DITROPAN) 5 MG/5ML syrup TAKE 2.5 MLS BY MOUTH TWICE A DAY   potassium chloride (KLOR-CON) 10 MEQ tablet Take 1 tablet (10 mEq total) by mouth 4 (four) times daily.   rOPINIRole (REQUIP) 1 MG tablet Take 0.5 tablet at 2p and 1.5 tablet 2-3 hour prior to bedtime.   sodium chloride, PF, 0.9 % injection Inject 5-10 mLs into the vein as needed (Immunoglobulin).   tamsulosin (FLOMAX) 0.4 MG CAPS capsule Take 1 capsule (0.4 mg total) by mouth daily.   torsemide (DEMADEX) 20 MG tablet Take 20 mg by mouth daily.   triamcinolone cream (KENALOG) 0.1 % Apply 1 application topically 2 (two) times daily as needed (rash).   UNABLE TO FIND Take 1 tablet by mouth daily. Med Name: [name] 1 by mouth daily for UTI prevention   vitamin B-12 (CYANOCOBALAMIN) 1000 MCG tablet TAKE 1 TABLET BY MOUTH DAILY   ZINC OXIDE, TOPICAL, 10 % CREA Apply 1 application topically 2 (two) times daily as needed (rash).   [DISCONTINUED] dexlansoprazole (DEXILANT) 60 MG capsule TAKE ONE CAPSULE BY MOUTH DAILY   [DISCONTINUED] famotidine (PEPCID) 20 MG tablet One after supper   dexlansoprazole (DEXILANT) 60 MG capsule Take 1 capsule (60 mg total) by mouth daily with breakfast.   famotidine (PEPCID) 20 MG tablet Take 1 tablet (20 mg total) by mouth at bedtime.   lamoTRIgine (LAMICTAL) 200 MG tablet Take 1 tablet (200 mg total) by mouth at bedtime.   mirtazapine (REMERON) 30 MG tablet Take 1 tablet (30 mg total) by mouth at bedtime.   [DISCONTINUED] dexlansoprazole (DEXILANT) 60 MG capsule Take 1 capsule (60 mg total) by mouth daily. **PLEASE KEEP  APPOINTMENT FOR FURTHER REFILLS   No facility-administered encounter medications on file as of 04/30/2022.   Allergies  Allergen Reactions   Imitrex [Sumatriptan] Nausea And Vomiting    Ringing in the ears, migraines are worse   Tetracycline Nausea Only    Causes ringing in ears, dizziness, migraine, nausea   Corticosteroids Other (See Comments)    12/02/2016 interview by [facility]: Oral dosing causes migraines/nausea/tinnitus. Prev tolerated IV and intranasal admin w/o difficulty.   Cefixime Other (See Comments)    Headache    Ciprofloxacin Other (See Comments)    Headache, dizziness, ringing in the ears   Doxycycline Other (See Comments)    unknown   Gabapentin     Throat swells/closes   Isometheptene-Dichloral-Apap  Other (See Comments)    unknown   Ketorolac     12/02/2016 interview by ER[facility]: Oral dosing prednisone/corticosteroids causes migraines/nausea/tinnitus. Prev tolerated IV and intranasal admin w/o difficulty.   Prednisone Other (See Comments)    Migraine, dizzy, ringing in ears-can take it IV 12/02/2016 interview by [name]: Oral prednisone dosing causes migraines/nausea/tinnitus. Prev tolerated IV and intranasal admin w/o difficulty.   Promethazine Other (See Comments)    causes severe abdominal pain when taken IV; however she can take PO or IM   Stadol [Butorphanol]     Cerebral pain   Tape Other (See Comments)    Adhesive tapes-patient denies   Erythromycin Base Diarrhea and Itching    Severe diarrhea   Propoxyphene Nausea Only   Patient Active Problem List   Diagnosis Date Noted   Polymyalgia rheumatica ([place]) 03/25/2022   Thrombocytopenia, unspecified ([place]) 12/30/2021   Upper airway cough syndrome with ? VCD  06/26/2021   Precordial chest pain 06/05/2021   Moderate mitral regurgitation 06/05/2021   Essential hypertension 06/05/2021   DOE (dyspnea on exertion) 06/05/2021   Medication monitoring encounter 04/01/2021   Gram-negative bacteremia 03/03/2021   Rigors  02/04/2021   Elevated troponin 01/14/2021   Hypokalemia 01/14/2021   Headache 01/14/2021   Chest discomfort 03/22/2019   Weight loss 01/27/2019   Acquired hypothyroidism 01/27/2019   Esophagitis 01/27/2019   Intractable migraine without status migrainosus 01/27/2019   Osteoporosis 01/27/2019   Chronic sinusitis 12/28/2018   Food intolerance/GI symptoms 12/28/2018   Acute maxillary sinusitis 12/28/2018   Lower leg edema 12/20/2018   Chronic pain of both shoulders 05/24/2018   Bilateral leg edema 05/24/2018   Chronic pain syndrome 03/31/2018   Left bundle branch block 10/28/2017   Palpitations 10/28/2017   History of gastric bypass 05/21/2017   Polypharmacy 02/11/2017   Senile nuclear sclerosis 11/26/2016   Chronic post-thoracotomy pain 04/09/2014   Chronic kidney disease 04/09/2014   Sleep disturbances 09/20/2012   Restless legs syndrome 07/07/2012   Hypertonicity of bladder 05/23/2012   Essential tremor 04/01/2012   Memory loss 04/01/2012   Incomplete emptying of bladder 01/14/2012   Urge incontinence 10/28/2011   Tension type headache 06/10/2011   Mixed urge and stress incontinence 05/28/2011   Hypogammaglobulinemia ([place]) 06/22/2010   Severe episode of recurrent major depressive disorder, without psychotic features ([place]) 06/22/2010   Hypothyroidism 02/12/2010   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[place]) 01/31/2010   ARACHNOIDITIS 01/31/2010   OBSTRUCTIVE SLEEP APNEA 01/31/2010   Chronic rhinitis 01/31/2010   Arachnoiditis 01/31/2010   Social History   Socioeconomic History   Marital status: Married    Spouse name: Not on file   Number of children: 2   Years of education: Not on file   Highest education level: GED or equivalent  Occupational History   Occupation: retired  Tobacco Use   Smoking status: Never   Smokeless tobacco: Never  Vaping Use   Vaping Use: Never used  Substance and Sexual Activity   Alcohol use: Not Currently   Drug use: Never   Sexual activity:  Not on file  Other Topics Concern   Not on file  Social History Narrative   Diet: None      Caffeine: coffee, tea, sodas less than or 1 daily.      Married, if yes what year: Yes, 1972      Do you live in a house, apartment, assisted living, condo, trailer, ect: Two story house       Pets: None  Current/Past profession: Teach      Exercise: None         Living Will: No   DNR: No   POA/HPOA: No      Functional Status:   Do you have difficulty bathing or dressing yourself? yes   Do you have difficulty preparing food or eating? yes   Do you have difficulty managing your medications? yes   Do you have difficulty managing your finances?yes   Do you have difficulty affording your medications? No      Right Handed    Lives in a two story home    Social Determinants of Health   Financial Resource Strain: Not on file  Food Insecurity: Not on file  Transportation Needs: Not on file  Physical Activity: Not on file  Stress: Not on file  Social Connections: Not on file  Intimate Partner Violence: Not on file    Ms. Borsuk's family history includes Arthritis in her father and mother; Asthma in her daughter and sister; Bipolar disorder in her brother and mother; Congestive Heart Failure in her father; Dementia in her father and mother; Depression in her father and sister; GER disease in her mother; Hypertension in her mother; Osteoporosis in her mother; Parkinson's disease in her father; Pneumonia in her father.      Objective:    Vitals:   04/30/22 1451  BP: 112/68  Pulse: 91    Physical Exam Well-developed well-nourished eld WF  in no acute distress very uncomfortable appearing, in a wheelchair- height, Weight, 140 BMI 24.8  HEENT; nontraumatic normocephalic, EOMI, PE R LA, sclera anicteric. Oropharynx; not examined today Neck; supple, no JVD Cardiovascular; regular rate and rhythm with S1-S2, no murmur rub or gallop Pulmonary; Clear bilaterally Abdomen; soft,  nontender, nondistended, no palpable mass or hepatosplenomegaly, bowel sounds are active Rectal; not done today Skin; benign exam, no jaundice rash or appreciable lesions Extremities; no clubbing cyanosis or edema skin warm and dry Neuro/Psych; alert and oriented x4, grossly nonfocal mood and affect appropriate        Assessment & Plan:   #60 76 year old white female status post Roux-en-Y gastric bypass, with history of chronic GERD, here for follow-up and med refills. She has been maintained on Dexilant 60 mg every morning and Pepcid 20 mg p.o. every afternoon. She has noticed some increased reflux symptoms recently but has also been having ongoing pain in her shoulders neck and back felt secondary to PMR and has been on steroids.  She has not been opening the Dexilant capsule which should help with absorption.  #2 history of adenomatous colon polyps-up-to-date with colonoscopy last done May 2021 and indicated for 7-year interval follow-up #3 anal AIN found on biopsy of atrophied anal papillae 12/2019.-Referred to surgery, not certain she followed up  #4 lung cancer status postresection 5.  IgG deficiency 6.  Severe pain syndrome over the past couple months secondary to probable PMR, awaiting rheumatology referral and currently on steroids -severely limiting her mobility #7 sleep apnea 8.  Chronic kidney disease 9.  Hypertension 10.  Depression #11 prior history of C. difficile 2022  Plan; refill Dexilant 60 mg p.o. every morning AC breakfast.  I discussed opening the capsule and taking with yogurt or applesauce for improved absorption and she expressed understanding.  Refill x1 year Refill Pepcid 20 mg p.o. every afternoon generally taking with dinner.  Refill x1 year  Have asked her to call back if she has any progression of what is currently a very  mild dysphagia to some small pills.  Would pursue barium swallow as initial study.  I will ask my nurse to contact [facility] to see if the  patient had been seen there for follow-up of the anal AIN.  If not, we may need to reinitiate.  Office follow-up with Dr. Havery Moros in 1 year or sooner as needed.    Jatasia Gundrum Genia Harold PA-C 04/30/2022   Cc: [name], NP

## 2022-04-30 NOTE — Patient Instructions (Signed)
If you are age 74 or older, your body mass index should be between 23-30. Your Body mass index is 24.8 kg/m. If this is out of the aforementioned range listed, please consider follow up with your Primary Care Provider.  __________________________________________________________  The [place] [facility] providers would like to encourage you to use [facility] to communicate with providers for non-urgent requests or questions.  Due to long hold times on the telephone, sending your provider a message by [facility] may be a faster and more efficient way to get a response.  Please allow 48 business hours for a response.  Please remember that this is for non-urgent requests.   We have sent the following medications to your pharmacy for you to pick up at your convenience: Dexilant, Pepcid  Dexilant - Open capsule take with apple sauce or yogurt every morning.  Call back and speak to nurse if difficult swallowing or worsens symptoms.  Follow up as needed.     Thank you for choosing me and [facility].  Edward Jolly, PA-C

## 2022-05-01 ENCOUNTER — Ambulatory Visit: Payer: Medicare PPO | Admitting: Nurse Practitioner

## 2022-05-04 ENCOUNTER — Encounter: Payer: Self-pay | Admitting: Family Medicine

## 2022-05-04 ENCOUNTER — Ambulatory Visit: Payer: Medicare PPO | Admitting: Family Medicine

## 2022-05-04 VITALS — BP 96/66 | HR 98 | Temp 97.8°F | Resp 16 | Ht 63.0 in | Wt 141.0 lb

## 2022-05-04 DIAGNOSIS — R531 Weakness: Secondary | ICD-10-CM

## 2022-05-04 DIAGNOSIS — M2550 Pain in unspecified joint: Secondary | ICD-10-CM | POA: Diagnosis not present

## 2022-05-04 DIAGNOSIS — M353 Polymyalgia rheumatica: Secondary | ICD-10-CM

## 2022-05-04 MED ORDER — POTASSIUM CHLORIDE ER 10 MEQ PO TBCR: 10.0000 meq | EXTENDED_RELEASE_TABLET | Freq: Four times a day (QID) | ORAL | 1 refills | Status: DC

## 2022-05-04 MED ORDER — PREDNISONE 20 MG PO TABS: ORAL_TABLET | ORAL | 0 refills | Status: DC

## 2022-05-04 NOTE — Patient Instructions (Addendum)
Heat (pad or rice pillow in microwave) over affected area, 10-15 minutes twice daily.   Ice/cold pack over area for 10-15 min twice daily.  If you do not hear anything about your referral in the next 1-2 weeks, call our office and ask for an update.  Foods that may reduce pain: 1) Ginger 2) Blueberries 3) Salmon 4) Pumpkin seeds 5) dark chocolate 6) turmeric 7) tart cherries 8) virgin olive oil 9) chilli peppers 10) mint 11) krill oil  Let us know if you need anything.  Trapezius stretches/exercises Do exercises exactly as told by your health care provider and adjust them as directed. It is normal to feel mild stretching, pulling, tightness, or discomfort as you do these exercises, but you should stop right away if you feel sudden pain or your pain gets worse.   Stretching and range of motion exercises These exercises warm up your muscles and joints and improve the movement and flexibility of your shoulder. These exercises can also help to relieve pain, numbness, and tingling. If you are unable to do any of the following for any reason, do not further attempt to do it.   Exercise A: Flexion, standing     Stand and hold a broomstick, a cane, or a similar object. Place your hands a little more than shoulder-width apart on the object. Your left / right hand should be palm-up, and your other hand should be palm-down. Push the stick to raise your left / right arm out to your side and then over your head. Use your other hand to help move the stick. Stop when you feel a stretch in your shoulder, or when you reach the angle that is recommended by your health care provider. Avoid shrugging your shoulder while you raise your arm. Keep your shoulder blade tucked down toward your spine. Hold for 30 seconds. Slowly return to the starting position. Repeat 2 times. Complete this exercise 3 times per week.  Exercise B: Abduction, supine     Lie on your back and hold a broomstick, a cane, or a  similar object. Place your hands a little more than shoulder-width apart on the object. Your left / right hand should be palm-up, and your other hand should be palm-down. Push the stick to raise your left / right arm out to your side and then over your head. Use your other hand to help move the stick. Stop when you feel a stretch in your shoulder, or when you reach the angle that is recommended by your health care provider. Avoid shrugging your shoulder while you raise your arm. Keep your shoulder blade tucked down toward your spine. Hold for 30 seconds. Slowly return to the starting position. Repeat 2 times. Complete this exercise 3 times per week.  Exercise C: Flexion, active-assisted     Lie on your back. You may bend your knees for comfort. Hold a broomstick, a cane, or a similar object. Place your hands about shoulder-width apart on the object. Your palms should face toward your feet. Raise the stick and move your arms over your head and behind your head, toward the floor. Use your healthy arm to help your left / right arm move farther. Stop when you feel a gentle stretch in your shoulder, or when you reach the angle where your health care provider tells you to stop. Hold for 30 seconds. Slowly return to the starting position. Repeat 2 times. Complete this exercise 3 times per week.  Exercise D: External rotation and  abduction     Stand in a door frame with one of your feet slightly in front of the other. This is called a staggered stance. Choose one of the following positions as told by your health care provider: Place your hands and forearms on the door frame above your head. Place your hands and forearms on the door frame at the height of your head. Place your hands on the door frame at the height of your elbows. Slowly move your weight onto your front foot until you feel a stretch across your chest and in the front of your shoulders. Keep your head and chest upright and keep your  abdominal muscles tight. Hold for 30 seconds. To release the stretch, shift your weight to your back foot. Repeat 2 times. Complete this stretch 3 times per week.  Strengthening exercises These exercises build strength and endurance in your shoulder. Endurance is the ability to use your muscles for a long time, even after your muscles get tired. Exercise E: Scapular depression and adduction  Sit on a stable chair. Support your arms in front of you with pillows, armrests, or a tabletop. Keep your elbows in line with the sides of your body. Gently move your shoulder blades down toward your middle back. Relax the muscles on the tops of your shoulders and in the back of your neck. Hold for 3 seconds. Slowly release the tension and relax your muscles completely before doing this exercise again. Repeat for a total of 10 repetitions. After you have practiced this exercise, try doing the exercise without the arm support. Then, try the exercise while standing instead of sitting. Repeat 2 times. Complete this exercise 3 times per week.  Exercise F: Shoulder abduction, isometric     Stand or sit about 4-6 inches (10-15 cm) from a wall with your left / right side facing the wall. Bend your left / right elbow and gently press your elbow against the wall. Increase the pressure slowly until you are pressing as hard as you can without shrugging your shoulder. Hold for 3 seconds. Slowly release the tension and relax your muscles completely. Repeat for a total of 10 repetitions. Repeat 2 times. Complete this exercise 3 times per week.  Exercise G: Shoulder flexion, isometric     Stand or sit about 4-6 inches (10-15 cm) away from a wall with your left / right side facing the wall. Keep your left / right elbow straight and gently press the top of your fist against the wall. Increase the pressure slowly until you are pressing as hard as you can without shrugging your shoulder. Hold for 10-15  seconds. Slowly release the tension and relax your muscles completely. Repeat for a total of 10 repetitions. Repeat 2 times. Complete this exercise 3 times per week.  Exercise H: Internal rotation     Sit in a stable chair without armrests, or stand. Secure an exercise band at your left / right side, at elbow height. Place a soft object, such as a folded towel or a small pillow, under your left / right upper arm so your elbow is a few inches (about 8 cm) away from your side. Hold the end of the exercise band so the band stretches. Keeping your elbow pressed against the soft object under your arm, move your forearm across your body toward your abdomen. Keep your body steady so the movement is only coming from your shoulder. Hold for 3 seconds. Slowly return to the starting position. Repeat for  a total of 10 repetitions. Repeat 2 times. Complete this exercise 3 times per week.  Exercise I: External rotation     Sit in a stable chair without armrests, or stand. Secure an exercise band at your left / right side, at elbow height. Place a soft object, such as a folded towel or a small pillow, under your left / right upper arm so your elbow is a few inches (about 8 cm) away from your side. Hold the end of the exercise band so the band stretches. Keeping your elbow pressed against the soft object under your arm, move your forearm out, away from your abdomen. Keep your body steady so the movement is only coming from your shoulder. Hold for 3 seconds. Slowly return to the starting position. Repeat for a total of 10 repetitions. Repeat 2 times. Complete this exercise 3 times per week. Exercise J: Shoulder extension  Sit in a stable chair without armrests, or stand. Secure an exercise band to a stable object in front of you so the band is at shoulder height. Hold one end of the exercise band in each hand. Your palms should face each other. Straighten your elbows and lift your hands up to shoulder  height. Step back, away from the secured end of the exercise band, until the band stretches. Squeeze your shoulder blades together and pull your hands down to the sides of your thighs. Stop when your hands are straight down by your sides. Do not let your hands go behind your body. Hold for 3 seconds. Slowly return to the starting position. Repeat for a total of 10 repetitions. Repeat 2 times. Complete this exercise 3 times per week.  Exercise K: Shoulder extension, prone     Lie on your abdomen on a firm surface so your left / right arm hangs over the edge. Hold a 5 lb weight in your hand so your palm faces in toward your body. Your arm should be straight. Squeeze your shoulder blade down toward the middle of your back. Slowly raise your arm behind you, up to the height of the surface that you are lying on. Keep your arm straight. Hold for 3 seconds. Slowly return to the starting position and relax your muscles. Repeat for a total of 10 repetitions. Repeat 2 times. Complete this exercise 3 times per week.   Exercise L: Horizontal abduction, prone  Lie on your abdomen on a firm surface so your left / right arm hangs over the edge. Hold a 5 lb weight in your hand so your palm faces toward your feet. Your arm should be straight. Squeeze your shoulder blade down toward the middle of your back. Bend your elbow so your hand moves up, until your elbow is bent to an "L" shape (90 degrees). With your elbow bent, slowly move your forearm forward and up. Raise your hand up to the height of the surface that you are lying on. Your upper arm should not move, and your elbow should stay bent. At the top of the movement, your palm should face the floor. Hold for 3 seconds. Slowly return to the starting position and relax your muscles. Repeat for a total of 10 repetitions. Repeat 2 times. Complete this exercise 3 times per week.  Exercise M: Horizontal abduction, standing  Sit on a stable chair, or  stand. Secure an exercise band to a stable object in front of you so the band is at shoulder height. Hold one end of the exercise band in  each hand. Straighten your elbows and lift your hands straight in front of you, up to shoulder height. Your palms should face down, toward the floor. Step back, away from the secured end of the exercise band, until the band stretches. Move your arms out to your sides, and keep your arms straight. Hold for 3 seconds. Slowly return to the starting position. Repeat for a total of 10 repetitions. Repeat 2 times. Complete this exercise 3 times per week.  Exercise N: Scapular retraction and elevation  Sit on a stable chair, or stand. Secure an exercise band to a stable object in front of you so the band is at shoulder height. Hold one end of the exercise band in each hand. Your palms should face each other. Sit in a stable chair without armrests, or stand. Step back, away from the secured end of the exercise band, until the band stretches. Squeeze your shoulder blades together and lift your hands over your head. Keep your elbows straight. Hold for 3 seconds. Slowly return to the starting position. Repeat for a total of 10 repetitions. Repeat 2 times. Complete this exercise 3 times per week.  This information is not intended to replace advice given to you by your health care provider. Make sure you discuss any questions you have with your health care provider. Document Released: 08/03/2005 Document Revised: 04/09/2016 Document Reviewed: 06/20/2015 Elsevier Interactive Patient Education  2017 [employer].

## 2022-05-04 NOTE — Progress Notes (Signed)
Chief Complaint  Patient presents with   New Patient (Initial Visit)    Pt states here to establish care and to discuss dx of PMR       New Patient Visit SUBJECTIVE: HPI: Megan Brewer is an 75 y.o.female who is being seen for establishing care.  The patient was previously seen at [facility] and adult medicine.  Patient has a history of polymyalgia rheumatica.  She was tentatively diagnosed in the emergency department and [facility], [place], [place].  She was placed on a 60 mg prednisone course but changed to as needed IM injections of Depo-Medrol.  The injection did help for only a day and a half.  She does have some shakiness on the 60 mg dosage of the prednisone but never went down to 40 mg.  Historically, she has worsened shakiness on methylprednisolone.  Interestingly, she thought they were both the same which could explain why she did not go to a lower dose of the prednisone.  She has pain in her neck, hands, shoulders, and upper arms.  She has a remote history of fibromyalgia.  She has associated diffuse weakness with this due to pain.  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[place])    Lung cancer ([place]) 2011, 2014   Memory loss    Migraine    Opioid abuse ([place])    Osteoporosis    Presence of neurostimulator    Restless legs    Sleep apnea    Past Surgical History:  Procedure Laterality Date   ABDOMINAL HYSTERECTOMY     CARPAL TUNNEL RELEASE     CATARACT EXTRACTION Bilateral 2019   CHOLECYSTECTOMY     COLONOSCOPY  2015   [facility]   CT LUNG SCREENING  2018   DENTAL SURGERY  06/17/2021   4 teeth rmoved   DG  BONE DENSITY ([place] HX)  2018    DIAGNOSTIC MAMMOGRAM  2019   GASTRIC BYPASS     LUNG CANCER SURGERY  02/2010   MULTIPLE TOOTH EXTRACTIONS     PORT A CATH REVISION Right 04/2021   PORT-A-CATH REMOVAL Left 02/07/2021   Procedure: REMOVAL PORT-A-CATH;  Surgeon: Kinsinger, Arta Bruce, MD;  Location: [facility];  Service: General;  Laterality: Left;  29 MINUTES ROOM 4   SPINAL CORD STIMULATOR IMPLANT     Family History  Problem Relation Age of Onset   Hypertension Mother    GER disease Mother    Dementia Mother    Osteoporosis Mother    Arthritis Mother    Bipolar disorder Mother    Parkinson's disease Father    Congestive Heart Failure Father    Pneumonia Father    Arthritis Father    Dementia Father    Depression Father    Asthma Sister    Depression Sister    Bipolar disorder Brother    Asthma Daughter    Colon cancer Neg Hx    Esophageal cancer Neg Hx    Stomach cancer Neg Hx    Rectal cancer Neg Hx    Allergies  Allergen Reactions   Imitrex [Sumatriptan] Nausea And Vomiting  Ringing in the ears, migraines are worse   Tetracycline Nausea Only    Causes ringing in ears, dizziness, migraine, nausea   Corticosteroids Other (See Comments)    12/02/2016 interview by Melissa Montane: Oral dosing causes migraines/nausea/tinnitus. Prev tolerated IV and intranasal admin w/o difficulty.   Cefixime Other (See Comments)    Headache    Ciprofloxacin Other (See Comments)    Headache, dizziness, ringing in the ears   Doxycycline Other (See Comments)    unknown   Gabapentin     Throat swells/closes   Isometheptene-Dichloral-Apap Other (See Comments)    unknown   Ketorolac     12/02/2016 interview by GEX: Oral dosing prednisone/corticosteroids causes migraines/nausea/tinnitus. Prev tolerated IV and intranasal admin w/o difficulty.   Prednisone Other (See Comments)    Migraine, dizzy, ringing in ears-can take it IV 12/02/2016 interview by [name]: Oral prednisone dosing causes migraines/nausea/tinnitus. Prev tolerated IV and  intranasal admin w/o difficulty.   Promethazine Other (See Comments)    causes severe abdominal pain when taken IV; however she can take PO or IM   Stadol [Butorphanol]     Cerebral pain   Tape Other (See Comments)    Adhesive tapes-patient denies   Erythromycin Base Diarrhea and Itching    Severe diarrhea   Propoxyphene Nausea Only    Current Outpatient Medications:    acetaminophen (TYLENOL) 500 MG tablet, Take 500 mg by mouth every 6 (six) hours as needed for moderate pain., Disp: , Rfl:    azelastine (ASTELIN) 0.1 % nasal spray, Place 2 sprays into both nostrils 2 (two) times daily., Disp: 30 mL, Rfl: 5   baclofen (LIORESAL) 10 MG tablet, Take 10 mg by mouth daily as needed for muscle spasms., Disp: , Rfl:    buPROPion (WELLBUTRIN XL) 150 MG 24 hr tablet, TAKE ONE (1) TABLET BY MOUTH EACH DAY, Disp: 90 tablet, Rfl: 1   CALCIUM PO, Take 1 tablet by mouth in the morning and at bedtime., Disp: , Rfl:    carbidopa-levodopa (SINEMET IR) 25-100 MG tablet, TAKE AS NEEDED FOR ESVERE RESTLESS LEG SYNDROME, Disp: 30 tablet, Rfl: 0   cholecalciferol (VITAMIN D3) 25 MCG (1000 UNIT) tablet, Take 1,000 Units by mouth daily., Disp: , Rfl:    cycloSPORINE (RESTASIS) 0.05 % ophthalmic emulsion, Place 1 drop into both eyes in the morning, at noon, in the evening, and at bedtime., Disp: , Rfl:    dexlansoprazole (DEXILANT) 60 MG capsule, Take 1 capsule (60 mg total) by mouth daily with breakfast., Disp: 30 capsule, Rfl: 11   famotidine (PEPCID) 20 MG tablet, Take 1 tablet (20 mg total) by mouth at bedtime., Disp: 30 tablet, Rfl: 11   ferrous sulfate 325 (65 FE) MG tablet, Take 650 mg by mouth daily with breakfast., Disp: , Rfl:    GAMMAGARD 20 GM/200ML SOLN, Inject into the vein. Every 21 days, Disp: , Rfl:    heparin lock flush 100 UNIT/ML SOLN injection, Inject 500 Units into the vein every 21 ( twenty-one) days., Disp: , Rfl:    HYDROcodone-acetaminophen (NORCO) 5-325 MG tablet, Take 1 tablet by mouth  every 6 (six) hours as needed for moderate pain., Disp: 30 tablet, Rfl: 0   hydrOXYzine (VISTARIL) 25 MG capsule, Take 25 mg by mouth at bedtime., Disp: , Rfl:    ibuprofen (ADVIL) 200 MG tablet, Take 600 mg by mouth as needed for mild pain., Disp: , Rfl:    immune globulin, human, (GAMMAGARD S/D LESS IGA) 10 g injection, Inject 2 g/kg  into the vein every 21 ( twenty-one) days., Disp: , Rfl:    levothyroxine (SYNTHROID) 75 MCG tablet, TAKE ONE (1) TABLET BY MOUTH EACH DAY 30MINUTES BEFORE BREAKFAST ON EMPTY STOMACH, Disp: 90 tablet, Rfl: 1   magnesium oxide (MAG-OX) 400 MG tablet, Take 400 mg by mouth daily. , Disp: , Rfl:    melatonin 5 MG TABS, Take 5 mg by mouth., Disp: , Rfl:    mupirocin ointment (BACTROBAN) 2 %, Apply 1 application. topically 2 (two) times daily., Disp: 22 g, Rfl: 0   ondansetron (ZOFRAN) 4 MG tablet, Take 1 tablet (4 mg total) by mouth every 8 (eight) hours as needed for nausea or vomiting., Disp: 20 tablet, Rfl: 0   oxybutynin (DITROPAN) 5 MG tablet, Take 2.5 mg by mouth 2 (two) times daily., Disp: , Rfl:    oxybutynin (DITROPAN) 5 MG/5ML syrup, TAKE 2.5 MLS BY MOUTH TWICE A DAY, Disp: 473 mL, Rfl: 1   predniSONE (DELTASONE) 20 MG tablet, Take 2 tabs daily for 7 days and then 1 tab daily., Disp: 37 tablet, Rfl: 0   rOPINIRole (REQUIP) 1 MG tablet, Take 0.5 tablet at 2p and 1.5 tablet 2-3 hour prior to bedtime., Disp: 180 tablet, Rfl: 3   sodium chloride, PF, 0.9 % injection, Inject 5-10 mLs into the vein as needed (Immunoglobulin)., Disp: , Rfl:    tamsulosin (FLOMAX) 0.4 MG CAPS capsule, Take 1 capsule (0.4 mg total) by mouth daily., Disp: 30 capsule, Rfl: 1   torsemide (DEMADEX) 20 MG tablet, Take 20 mg by mouth daily., Disp: , Rfl:    triamcinolone cream (KENALOG) 0.1 %, Apply 1 application topically 2 (two) times daily as needed (rash)., Disp: , Rfl:    UNABLE TO FIND, Take 1 tablet by mouth daily. Med Name: [name] 1 by mouth daily for UTI prevention, Disp: , Rfl:     vitamin B-12 (CYANOCOBALAMIN) 1000 MCG tablet, TAKE 1 TABLET BY MOUTH DAILY, Disp: , Rfl:    ZINC OXIDE, TOPICAL, 10 % CREA, Apply 1 application topically 2 (two) times daily as needed (rash)., Disp: , Rfl:    lamoTRIgine (LAMICTAL) 200 MG tablet, Take 1 tablet (200 mg total) by mouth at bedtime., Disp: 90 tablet, Rfl: 0   mirtazapine (REMERON) 30 MG tablet, Take 1 tablet (30 mg total) by mouth at bedtime., Disp: 90 tablet, Rfl: 1   potassium chloride (KLOR-CON) 10 MEQ tablet, Take 1 tablet (10 mEq total) by mouth 4 (four) times daily., Disp: 360 tablet, Rfl: 1  OBJECTIVE: BP 96/66 (BP Location: Left Arm, Patient Position: Sitting, Cuff Size: Normal)   Pulse 98   Temp 97.8 F (36.6 C) (Oral)   Resp 16   Ht 5\' 3"  (1.6 m)   Wt 141 lb (64 kg) Comment: per patient  SpO2 97%   BMI 24.98 kg/m  General:  well developed, well nourished, in no apparent distress Lungs:  clear to auscultation, breath sounds equal bilaterally, no respiratory distress Cardio:  regular rate and rhythm Musculoskeletal: Extremely tender to palpation over the trapezius region and deltoids. Neuro: Grip strength adequate Psych: Age appropriate judgment and insight  ASSESSMENT/PLAN: Polymyalgia rheumatica ([place]) - Plan: Ambulatory referral to Rheumatology, predniSONE (DELTASONE) 20 MG tablet  Polyarthralgia - Plan: Ambulatory referral to Rheumatology  Weakness - Plan: potassium chloride (KLOR-CON) 10 MEQ tablet  Chronic, uncontrolled.  Refer to rheumatology through [facility] at her request.  I will start her back on prednisone to the lowest tolerated dosage.  We will start her on  1 week of 40 mg daily and then wean down to 20 mg daily.  I would like to see her in 2 weeks to recheck on this.  I told her I would not be managing this long-term but will manage the prednisone until she can get in with rheumatology. She was given stretches and exercises for the trapezius region in addition to a list of foods associated with  lower inflammation.  Tylenol, ice, heat also recommended.  Could consider trigger point injections in addition to starting a dose of duloxetine. Refill potassium. The patient voiced understanding and agreement to the plan.   [place], DO 05/04/22  3:44 PM

## 2022-05-15 ENCOUNTER — Other Ambulatory Visit: Payer: Self-pay | Admitting: Family Medicine

## 2022-05-15 DIAGNOSIS — M353 Polymyalgia rheumatica: Secondary | ICD-10-CM

## 2022-05-18 ENCOUNTER — Encounter: Payer: Self-pay | Admitting: Family Medicine

## 2022-05-18 ENCOUNTER — Ambulatory Visit: Payer: Medicare PPO | Admitting: Family Medicine

## 2022-05-18 VITALS — BP 100/64 | HR 103 | Temp 97.6°F | Ht 64.0 in | Wt 140.3 lb

## 2022-05-18 DIAGNOSIS — G8929 Other chronic pain: Secondary | ICD-10-CM | POA: Diagnosis not present

## 2022-05-18 DIAGNOSIS — M353 Polymyalgia rheumatica: Secondary | ICD-10-CM | POA: Diagnosis not present

## 2022-05-18 MED ORDER — DULOXETINE HCL 20 MG PO CPEP: 20.0000 mg | ORAL_CAPSULE | Freq: Every day | ORAL | 3 refills | Status: DC

## 2022-05-18 NOTE — Patient Instructions (Signed)
If you do not hear anything about your referral in the next 1-2 weeks, call our office and ask for an update.  Ask for Shirlean Mylar if there are issues with anything.  Ice/cold pack over area for 10-15 min twice daily.  OK to take Tylenol 1000 mg (2 extra strength tabs) or 975 mg (3 regular strength tabs) every 6 hours as needed.  Let us know if you need anything.

## 2022-05-18 NOTE — Progress Notes (Signed)
Chief Complaint  Patient presents with   Follow-up    Subjective: Patient is a 76 y.o. female here for f/u. Here w spouse.   Tx'd for PMR, was placed on 60 mg/d of prednisone which was OK, 40 mg things came back. She was unable to do the stretches/exercises for her trap. Prednisone also gave her shaking. She has had PT sveeral times in past which helps intermittently. She is awaiting a pain management and rheum referral. No recent inj or change in activity. Requesting Plaquenil as a family member had same dx and did very well.   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[place])    Lung cancer ([place]) 2011, 2014   Memory loss    Migraine    Opioid abuse (HCC)    Osteoporosis    Presence of neurostimulator    Restless legs    Sleep apnea     Objective: BP 100/64 (BP Location: Left Arm, Patient Position: Sitting, Cuff Size: Normal)   Pulse (!) 103   Temp 97.6 F (36.4 C) (Oral)   Ht 5\' 4"  (1.626 m)   Wt 140 lb 5 oz (63.6 kg)   SpO2 93%   BMI 24.08 kg/m  General: Awake, appears stated age Lungs: No accessory muscle use Psych: Age appropriate judgment and insight, normal affect and mood  Assessment and Plan: Other chronic pain - Plan: DULoxetine (CYMBALTA) 20 MG capsule  Polymyalgia rheumatica ([place]) - Plan: Ambulatory referral to Rheumatology  Chronic, uncontrolled. Await referral to pain. Start Cymbalta 20 mg/d. F/u in 4 weeks. Try to stay active.  Stop prednisone. Refer rheum thru [facility]. Message also sent to referral team.  The patient and her spouse voiced understanding and agreement to the plan.  [place], DO 05/18/22  2:00 PM

## 2022-05-19 DIAGNOSIS — N3020 Other chronic cystitis without hematuria: Secondary | ICD-10-CM | POA: Diagnosis not present

## 2022-05-19 DIAGNOSIS — R3915 Urgency of urination: Secondary | ICD-10-CM | POA: Diagnosis not present

## 2022-05-21 ENCOUNTER — Other Ambulatory Visit: Payer: Self-pay | Admitting: Family Medicine

## 2022-05-21 ENCOUNTER — Encounter: Payer: Self-pay | Admitting: Family Medicine

## 2022-05-21 MED ORDER — TRAMADOL HCL 50 MG PO TABS: 50.0000 mg | ORAL_TABLET | Freq: Two times a day (BID) | ORAL | 0 refills | Status: DC | PRN

## 2022-05-25 ENCOUNTER — Encounter: Payer: Self-pay | Admitting: Physical Medicine and Rehabilitation

## 2022-05-25 ENCOUNTER — Telehealth: Payer: Self-pay | Admitting: Family Medicine

## 2022-05-25 NOTE — Telephone Encounter (Signed)
Sent [facility] a message.

## 2022-05-25 NOTE — Telephone Encounter (Signed)
Patient called to follow up on referral. She said she called Rheumatologist with [facility] but they have not received. Please resend.

## 2022-05-25 NOTE — Telephone Encounter (Signed)
Patient informed PCP stated I called the office to verify fax # and I resent the referral again to rheumatology at [facility] so hopefully they will receive it  Called informed the patient of information. She will call tomorrow and try to hopefully get appt scheduled.

## 2022-05-25 NOTE — Addendum Note (Signed)
Addended by: Carroll Kinds on: 05/25/2022 10:25 AM   Modules accepted: Orders

## 2022-06-03 ENCOUNTER — Other Ambulatory Visit (HOSPITAL_BASED_OUTPATIENT_CLINIC_OR_DEPARTMENT_OTHER): Payer: Self-pay

## 2022-06-03 MED ORDER — FLUAD QUADRIVALENT 0.5 ML IM PRSY
PREFILLED_SYRINGE | INTRAMUSCULAR | 0 refills | Status: DC
  Filled 2022-06-03: qty 0.5, 1d supply, fill #0

## 2022-06-03 MED ORDER — COMIRNATY 30 MCG/0.3ML IM SUSY
PREFILLED_SYRINGE | INTRAMUSCULAR | 0 refills | Status: DC
  Filled 2022-06-03: qty 0.3, 1d supply, fill #0

## 2022-06-05 DIAGNOSIS — G894 Chronic pain syndrome: Secondary | ICD-10-CM | POA: Diagnosis not present

## 2022-06-05 DIAGNOSIS — M797 Fibromyalgia: Secondary | ICD-10-CM | POA: Diagnosis not present

## 2022-06-11 ENCOUNTER — Other Ambulatory Visit (HOSPITAL_BASED_OUTPATIENT_CLINIC_OR_DEPARTMENT_OTHER): Payer: Self-pay

## 2022-06-11 ENCOUNTER — Encounter: Payer: Medicare PPO | Attending: Physical Medicine and Rehabilitation | Admitting: Physical Medicine and Rehabilitation

## 2022-06-11 VITALS — BP 80/54 | HR 101 | Ht 64.0 in | Wt 139.0 lb

## 2022-06-11 DIAGNOSIS — R52 Pain, unspecified: Secondary | ICD-10-CM

## 2022-06-11 DIAGNOSIS — M353 Polymyalgia rheumatica: Secondary | ICD-10-CM

## 2022-06-11 MED ORDER — AREXVY 120 MCG/0.5ML IM SUSR
INTRAMUSCULAR | 0 refills | Status: DC
  Filled 2022-06-11: qty 1, 1d supply, fill #0

## 2022-06-11 NOTE — Patient Instructions (Signed)
Foods that may reduce pain: 1) Ginger (especially studied for arthritis)- reduce leukotriene production to decrease inflammation 2) Blueberries- high in phytonutrients that decrease inflammation 3) Salmon- marine omega-3s reduce joint swelling and pain 4) Pumpkin seeds- reduce inflammation 5) dark chocolate- reduces inflammation 6) turmeric- reduces inflammation 7) tart cherries - reduce pain and stiffness 8) extra virgin olive oil - its compound olecanthal helps to block prostaglandins  9) chili peppers- can be eaten or applied topically via capsaicin 10) mint- helpful for headache, muscle aches, joint pain, and itching 11) garlic- reduces inflammation  Link to further information on diet for chronic pain: [email or web address]

## 2022-06-11 NOTE — Progress Notes (Signed)
Subjective:    Patient ID: Megan Brewer, female    DOB: 1946/03/29, 76 y.o.   MRN: [id number]  HPI 1) Diffuse pain, has been attributed to polymyalgia rheumatica Megan Brewer is a 76 year old woman who presents to establish care for diffuse pain.  -it is present in her wrists, clavicles, left hip -they have tried high doses of prednisone, low does hydrocodone, low doses Tramadol, lidocaine patches. Nothing has been helpful yet -she thinks the pain started after she spent 1 month at [facility] for sepsis and C diff. She left the hospital on June 1st. On June 4th she started with breathing difficulties, shaking, and some pain. It gradually got worse.  -her husband accompanies her today  2) Shortness of breath -had been present July 2022 to July 2023 -she also feels a deflated rubber tire on her chest -she has been told her heart and lungs are fine -the steroids may have helped her to breath better -at the hospital where she was diagnosed she got a massive IV dose of steroids and this cleared up her lungs  Pain Inventory Average Pain 10 Pain Right Now 10 My pain is sharp, stabbing, and aching  In the last 24 hours, has pain interfered with the following? General activity 7 Relation with others 8 Enjoyment of life 10 What TIME of day is your pain at its worst? morning , daytime, evening, and night Sleep (in general) Poor  Pain is worse with: walking, bending, standing, and some activites Pain improves with: medication Relief from Meds: 5  walk with assistance how many minutes can you walk? 5-10 ability to climb steps?  no do you drive?  no use a wheelchair needs help with transfers  not employed: date last employed 2001 disabled: date disabled . I need assistance with the following:  household duties and shopping  bladder control problems weakness tremor trouble walking spasms dizziness depression anxiety  New pt  New pt    Family History  Problem  Relation Age of Onset   Hypertension Mother    GER disease Mother    Dementia Mother    Osteoporosis Mother    Arthritis Mother    Bipolar disorder Mother    Parkinson's disease Father    Congestive Heart Failure Father    Pneumonia Father    Arthritis Father    Dementia Father    Depression Father    Asthma Sister    Depression Sister    Bipolar disorder Brother    Asthma Daughter    Colon cancer Neg Hx    Esophageal cancer Neg Hx    Stomach cancer Neg Hx    Rectal cancer Neg Hx    Social History   Socioeconomic History   Marital status: Married    Spouse name: Not on file   Number of children: 2   Years of education: Not on file   Highest education level: GED or equivalent  Occupational History   Occupation: retired  Tobacco Use   Smoking status: Never   Smokeless tobacco: Never  Vaping Use   Vaping Use: Never used  Substance and Sexual Activity   Alcohol use: Not Currently   Drug use: Never   Sexual activity: Not on file  Other Topics Concern   Not on file  Social History Narrative   Diet: None      Caffeine: coffee, tea, sodas less than or 1 daily.      Married, if yes what year: Yes,  1972      Do you live in a house, apartment, assisted living, condo, trailer, ect: Two story house       Pets: None      Current/Past profession: Teach      Exercise: None         Living Will: No   DNR: No   POA/HPOA: No      Functional Status:   Do you have difficulty bathing or dressing yourself? yes   Do you have difficulty preparing food or eating? yes   Do you have difficulty managing your medications? yes   Do you have difficulty managing your finances?yes   Do you have difficulty affording your medications? No      Right Handed    Lives in a two story home    Social Determinants of Health   Financial Resource Strain: Not on file  Food Insecurity: Not on file  Transportation Needs: Not on file  Physical Activity: Not on file  Stress: Not on file   Social Connections: Not on file   Past Surgical History:  Procedure Laterality Date   ABDOMINAL HYSTERECTOMY     CARPAL TUNNEL RELEASE     CATARACT EXTRACTION Bilateral 2019   CHOLECYSTECTOMY     COLONOSCOPY  2015   [facility]   CT LUNG SCREENING  2018   DENTAL SURGERY  06/17/2021   4 teeth rmoved   DG  BONE DENSITY ([place] [facility])  2018   DIAGNOSTIC MAMMOGRAM  2019   GASTRIC BYPASS     LUNG CANCER SURGERY  02/2010   MULTIPLE TOOTH EXTRACTIONS     PORT A CATH REVISION Right 04/2021   PORT-A-CATH REMOVAL Left 02/07/2021   Procedure: REMOVAL PORT-A-CATH;  Surgeon: Mickeal Skinner, MD;  Location: [facility];  Service: General;  Laterality: Left;  60 MINUTES ROOM 4   SPINAL CORD STIMULATOR IMPLANT     Past Medical History:  Diagnosis Date   Anemia    Arachnoiditis    Bipolar disorder ([place])    Cataract    removed   Chronic post-thoracotomy pain    CKD (chronic kidney disease)    GERD (gastroesophageal reflux disease)    Hypogammaglobulinemia (HCC)    Hypotension    Hypothyroid    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HCC)    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[place])    Lung cancer ([place]) 2011, 2014   Memory loss    Migraine    Opioid abuse ([place])    Osteoporosis    Presence of neurostimulator    Restless legs    Sleep apnea    BP (!) 80/54   Pulse (!) 101   Ht 5\' 4"  (1.626 m)   Wt 139 lb (63 kg) Comment: pt states  SpO2 97%   BMI 23.86 kg/m   Opioid Risk Score:   Fall Risk Score:  `1  Depression screen [facility] 2/9     06/11/2022   10:09 AM 09/30/2021   11:02 AM 02/04/2021    3:28 PM 09/27/2020   10:57 AM 06/10/2020    2:51 PM 09/25/2019    1:06 PM 07/05/2019    1:18 PM  Depression screen PHQ 2/9  Decreased Interest 3 0 0 0 0 0 0  Down, Depressed, Hopeless 3 0 0 0 0 0 0  PHQ - 2 Score 6 0 0 0 0 0 0  Altered sleeping 3        Tired, decreased energy 3        Change in  appetite 2        Feeling bad or failure about yourself  3        Trouble concentrating 0        Moving  slowly or fidgety/restless 3        Suicidal thoughts 3        PHQ-9 Score 23        Difficult doing work/chores Extremely dIfficult            Review of Systems  Respiratory:  Positive for shortness of breath.   Gastrointestinal:  Positive for constipation and nausea.  Musculoskeletal:  Positive for gait problem.       Spasms  Neurological:  Positive for dizziness, tremors and weakness.  Psychiatric/Behavioral:  Positive for dysphoric mood. The patient is nervous/anxious.   All other systems reviewed and are negative.     Objective:   Physical Exam Gen: no distress, normal appearing HEENT: oral mucosa pink and moist, NCAT Cardio: Reg rate Chest: normal effort, normal rate of breathing Abd: soft, non-distended Ext: no edema Psych: pleasant, normal affect Skin: intact Neuro: Alert and oriented x3. Slow speech       Assessment & Plan:   1) Chronic Pain Syndrome  with diffuse pain secondary to Polymylagia rheumatica -Discussed current symptoms of pain and history of pain.  -Discussed benefits of exercise in reducing pain. -discussed that the only thing that has been helpful was IV steroids.  -continue cymbalta 20mg  daily.  -will send message to her care team (Dr. Nani Ravens PCP, Dr. Percival Spanish cardiologist, Dr. Tana Coast rheumatologist) to discuss doing IV steroid treatment.  -will call her rheumatologist today to discuss Kevzara and Plaquenil.  -discussed her follow-up with rheumatology, who placed her on low dose prednisone 5mg  BID.  -Discussed following foods that may reduce pain: 1) Ginger (especially studied for arthritis)- reduce leukotriene production to decrease inflammation 2) Blueberries- high in phytonutrients that decrease inflammation 3) Salmon- marine omega-3s reduce joint swelling and pain 4) Pumpkin seeds- reduce inflammation 5) dark chocolate- reduces inflammation 6) turmeric- reduces inflammation 7) tart cherries - reduce pain and stiffness 8) extra  virgin olive oil - its compound olecanthal helps to block prostaglandins  9) chili peppers- can be eaten or applied topically via capsaicin 10) mint- helpful for headache, muscle aches, joint pain, and itching 11) garlic- reduces inflammation  Link to further information on diet for chronic pain: [email or web address]    2) Shortness of breath -discussed that it started one year ago -discussed that cardio pulmonary workup has been normal -discussed that her IV steroids were very helpful.

## 2022-06-12 ENCOUNTER — Encounter: Payer: Self-pay | Admitting: Pharmacist

## 2022-06-12 DIAGNOSIS — Z9189 Other specified personal risk factors, not elsewhere classified: Secondary | ICD-10-CM

## 2022-06-12 NOTE — Progress Notes (Addendum)
[facility])                                            [place] Team                                        Statin Quality Measure Assessment    06/12/2022  Megan Brewer 03-Sep-1945 [id number]  Per review of chart and payor information, patient has a diagnosis of cardiovascular disease but is not currently filling a statin prescription.  This places patient into the [facility] (Statin Use In Patients with Cardiovascular Disease) measure for CMS.    I could not find any documentation of previous trial of a statin or a history of statin intolerance. If deemed therapeutically appropriate, patient could be evaluated for statin therapy during her upcoming visit on 06/15/22.  If she has experienced statin intolerance, a statin exclusion code could be associated with the visit which would remove her from the measure.     Component Value Date/Time   CHOL  01/27/2010 0550    144        ATP III CLASSIFICATION:  <200     mg/dL   Desirable  200-239  mg/dL   Borderline High  >=240    mg/dL   High          TRIG 68 01/27/2010 0550   HDL 58 01/27/2010 0550   CHOLHDL 2.5 01/27/2010 0550   VLDL 14 01/27/2010 0550   LDLCALC  01/27/2010 0550    72        Total Cholesterol/HDL:CHD Risk Coronary Heart Disease Risk Table                     Men   Women  1/2 Average Risk   3.4   3.3  Average Risk       5.0   4.4  2 X Average Risk   9.6   7.1  3 X Average Risk  23.4   11.0        Use the calculated Patient Ratio above and the CHD Risk Table to determine the patient's CHD Risk.        ATP III CLASSIFICATION (LDL):  <100     mg/dL   Optimal  100-129  mg/dL   Near or Above                    Optimal  130-159  mg/dL   Borderline  160-189  mg/dL   High  >190     mg/dL   Very High     Please consider ONE of the following recommendations:  Initiate high intensity statin Atorvastatin 40mg  once daily, #90, 3 refills   Rosuvastatin  20mg  once daily, #90, 3 refills    Initiate moderate intensity  statin with reduced frequency if prior  statin intolerance 1x weekly, #13, 3 refills   2x weekly, #26, 3 refills   3x weekly, #39, 3 refills    Code for past statin intolerance  (required annually)   Provider Requirements:  Must asociate code during an office visit or telehealth encounter   Drug Induced Myopathy G72.0   Myalgia M79.1   Myositis, unspecified M60.9   Myopathy, unspecified G72.9   Rhabdomyolysis  M62.82     Plan:  Route note to patient's PCP prior to upcoming appointment.  Elayne Guerin, PharmD, [place] Clinical Pharmacist [phone]

## 2022-06-15 ENCOUNTER — Encounter: Payer: Self-pay | Admitting: Family Medicine

## 2022-06-15 ENCOUNTER — Ambulatory Visit: Payer: Medicare PPO | Admitting: Family Medicine

## 2022-06-15 ENCOUNTER — Telehealth: Payer: Self-pay | Admitting: Family Medicine

## 2022-06-15 VITALS — BP 112/68 | HR 102 | Temp 97.8°F | Ht 64.0 in | Wt 138.0 lb

## 2022-06-15 DIAGNOSIS — G43909 Migraine, unspecified, not intractable, without status migrainosus: Secondary | ICD-10-CM | POA: Diagnosis not present

## 2022-06-15 DIAGNOSIS — G8929 Other chronic pain: Secondary | ICD-10-CM | POA: Diagnosis not present

## 2022-06-15 MED ORDER — AMITRIPTYLINE HCL 10 MG PO TABS: 10.0000 mg | ORAL_TABLET | Freq: Every day | ORAL | 2 refills | Status: DC

## 2022-06-15 MED ORDER — TRAMADOL HCL 50 MG PO TABS: 50.0000 mg | ORAL_TABLET | Freq: Two times a day (BID) | ORAL | 2 refills | Status: DC | PRN

## 2022-06-15 NOTE — Patient Instructions (Addendum)
Keep the diet clean and stay active.  Try to get in the pool. Consider chair yoga.   Let us know if you need anything.

## 2022-06-15 NOTE — Progress Notes (Signed)
Chief Complaint  Patient presents with   Follow-up    Migraine today     Subjective: Patient is a 76 y.o. female here for f/u chronic pain.  Started on Cymbalta 20 mg/d. Since that time, noticed no improvement.  She was compliant with the medication and reports no adverse effects.  She is not exercising due to pain.  She saw the physical medicine and rehab team in addition to rheumatology who think she has fibromyalgia.  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[place])    Lung cancer ([place]) 2011, 2014   Memory loss    Migraine    Opioid abuse (HCC)    Osteoporosis    Presence of neurostimulator    Restless legs    Sleep apnea     Objective: BP 112/68 (BP Location: Left Arm, Patient Position: Sitting, Cuff Size: Normal)   Pulse (!) 102   Temp 97.8 F (36.6 C) (Oral)   Ht 5\' 4"  (1.626 m)   Wt 138 lb (62.6 kg)   SpO2 98%   BMI 23.69 kg/m  General: Awake, appears stated age Lungs: No accessory muscle use Psych: normal affect and mood  Assessment and Plan: Other chronic pain - Plan: traMADol (ULTRAM) 50 MG tablet, amitriptyline (ELAVIL) 10 MG tablet  Migraine without status migrainosus, not intractable, unspecified migraine type  Chronic, uncontrolled.  Tramadol for breakthrough pain, amitriptyline 10 mg nightly.  Stop Cymbalta.  Could consider low-dose of Lyrica if no improvement.  Follow-up in 1 month. Tramadol helps with her migraines historically as Toradol does not. We will look into Prolia injections. The patient and her spouse voiced understanding and agreement to the plan.  [place], DO 06/15/22  12:31 PM

## 2022-06-15 NOTE — Telephone Encounter (Signed)
Pt has tried and Failed Fosamax.

## 2022-06-15 NOTE — Telephone Encounter (Signed)
PCP would like this patient to try and start on Prolia. Thanks for your help

## 2022-06-16 ENCOUNTER — Other Ambulatory Visit (HOSPITAL_COMMUNITY): Payer: Self-pay

## 2022-06-16 NOTE — Telephone Encounter (Signed)
Last Prolia injection 10/24/2021

## 2022-06-16 NOTE — Telephone Encounter (Signed)
Pharmacy Patient Advocate Encounter  Insurance verification completed.    The patient is insured through [employer]   Ran test claims for: Prolia 60mg.  Pharmacy benefit copay: $64.00 

## 2022-06-17 NOTE — Telephone Encounter (Signed)
Prolia verificiation of benefits entered into [place]

## 2022-06-20 ENCOUNTER — Encounter (HOSPITAL_BASED_OUTPATIENT_CLINIC_OR_DEPARTMENT_OTHER): Payer: Self-pay

## 2022-06-20 ENCOUNTER — Other Ambulatory Visit: Payer: Self-pay

## 2022-06-20 ENCOUNTER — Emergency Department (HOSPITAL_BASED_OUTPATIENT_CLINIC_OR_DEPARTMENT_OTHER)
Admission: EM | Admit: 2022-06-20 | Discharge: 2022-06-20 | Disposition: A | Discharge status: Home / Self Care | Payer: Medicare PPO | Attending: Emergency Medicine | Admitting: Emergency Medicine

## 2022-06-20 DIAGNOSIS — Z85118 Personal history of other malignant neoplasm of bronchus and lung: Secondary | ICD-10-CM | POA: Insufficient documentation

## 2022-06-20 DIAGNOSIS — M25512 Pain in left shoulder: Secondary | ICD-10-CM | POA: Insufficient documentation

## 2022-06-20 DIAGNOSIS — N189 Chronic kidney disease, unspecified: Secondary | ICD-10-CM | POA: Diagnosis not present

## 2022-06-20 DIAGNOSIS — E039 Hypothyroidism, unspecified: Secondary | ICD-10-CM | POA: Diagnosis not present

## 2022-06-20 DIAGNOSIS — G894 Chronic pain syndrome: Secondary | ICD-10-CM | POA: Diagnosis not present

## 2022-06-20 DIAGNOSIS — I129 Hypertensive chronic kidney disease with stage 1 through stage 4 chronic kidney disease, or unspecified chronic kidney disease: Secondary | ICD-10-CM | POA: Insufficient documentation

## 2022-06-20 DIAGNOSIS — Z79899 Other long term (current) drug therapy: Secondary | ICD-10-CM | POA: Diagnosis not present

## 2022-06-20 LAB — BASIC METABOLIC PANEL
Anion gap: 10 (ref 5–15)
BUN: 29 mg/dL — ABNORMAL HIGH (ref 8–23)
CO2: 26 mmol/L (ref 22–32)
Calcium: 9.3 mg/dL (ref 8.9–10.3)
Chloride: 102 mmol/L (ref 98–111)
Creatinine, Ser: 1.64 mg/dL — ABNORMAL HIGH (ref 0.44–1.00)
GFR, Estimated: 33 mL/min — ABNORMAL LOW (ref >60)
Glucose, Bld: 98 mg/dL (ref 70–99)
Potassium: 4.1 mmol/L (ref 3.5–5.1)
Sodium: 138 mmol/L (ref 135–145)

## 2022-06-20 LAB — CBC
HCT: 42.9 % (ref 36.0–46.0)
Hemoglobin: 14.4 g/dL (ref 12.0–15.0)
MCH: 30.1 pg (ref 26.0–34.0)
MCHC: 33.6 g/dL (ref 30.0–36.0)
MCV: 89.7 fL (ref 80.0–100.0)
Platelets: 266 10*3/uL (ref 150–400)
RBC: 4.78 MIL/uL (ref 3.87–5.11)
RDW: 12.3 % (ref 11.5–15.5)
WBC: 12.3 10*3/uL — ABNORMAL HIGH (ref 4.0–10.5)
nRBC: 0 % (ref 0.0–0.2)

## 2022-06-20 MED ORDER — PREDNISONE 10 MG PO TABS: 20.0000 mg | ORAL_TABLET | Freq: Every day | ORAL | 0 refills | Status: DC

## 2022-06-20 MED ORDER — KETAMINE HCL 10 MG/ML IJ SOLN
0.3000 mg/kg | Freq: Once | INTRAMUSCULAR | Status: AC
  Administered 2022-06-20: 19 mg via INTRAVENOUS
  Filled 2022-06-20: qty 1

## 2022-06-20 MED ORDER — FENTANYL CITRATE PF 50 MCG/ML IJ SOSY
50.0000 ug | PREFILLED_SYRINGE | Freq: Once | INTRAMUSCULAR | Status: AC
  Administered 2022-06-20: 50 ug via INTRAVENOUS
  Filled 2022-06-20: qty 1

## 2022-06-20 MED ORDER — HEPARIN SOD (PORK) LOCK FLUSH 100 UNIT/ML IV SOLN: 500.0000 [IU] | Freq: Once | INTRAVENOUS | Status: AC

## 2022-06-20 MED ORDER — HEPARIN SOD (PORK) LOCK FLUSH 100 UNIT/ML IV SOLN
INTRAVENOUS | Status: AC
  Administered 2022-06-20: 500 [IU]
  Filled 2022-06-20: qty 5

## 2022-06-20 MED ORDER — KETOROLAC TROMETHAMINE 15 MG/ML IJ SOLN
15.0000 mg | Freq: Once | INTRAMUSCULAR | Status: AC
  Administered 2022-06-20: 15 mg via INTRAVENOUS
  Filled 2022-06-20: qty 1

## 2022-06-20 NOTE — ED Triage Notes (Signed)
Complains of pain from skull down to left shoulder/back and left arm.  Reports it is from her polymyalgia rheumatica.  Patient lips are currently blue.  Patient reports this started on Tuesday.  Patient sats in triage is 97%. HR 130's.  Patient is hyperventilating at this time.  Denies CP SOB reports this is all because of pain.

## 2022-06-20 NOTE — ED Provider Notes (Signed)
[facility] Provider Note   CSN: [id number] Arrival date & time: 06/20/22  0932     History  Chief Complaint  Patient presents with   Pain    Megan Brewer is a 76 y.o. female with medical history of anemia, CKD, GERD, hypertension, hypothyroid, lung cancer, opioid abuse, osteoporosis, restless legs, polymyalgia rheumatica, fibromyalgia.  Patient presents to the ED for evaluation of pain.  Patient states that she has had pain from the base of her skull down to her left shoulder and back in the left arm.  Patient reports this is from her polymyalgia rheumatica.  Patient reports that the pain began on Tuesday.  The patient was recently seen by her PCP and prescribed tramadol, prednisone taper.  Patient reports that she has been taking both of these medications without any alleviation of her symptoms.  Patient denies any chest pain, shortness of breath, fevers, nausea or vomiting.  Patient reports that her symptoms are a result of her uncontrolled pain.  Patient denies any recent falls or injuries to account for increased pain.  HPI     Home Medications Prior to Admission medications   Medication Sig Start Date End Date Taking? Authorizing Provider  predniSONE (DELTASONE) 10 MG tablet Take 2 tablets (20 mg total) by mouth daily. 06/20/22  Yes Azucena Cecil, PA-C  acetaminophen (TYLENOL) 500 MG tablet Take 500 mg by mouth every 6 (six) hours as needed for moderate pain.    [provider]  amitriptyline (ELAVIL) 10 MG tablet Take 1 tablet (10 mg total) by mouth at bedtime. 06/15/22   Shelda Pal, DO  azelastine (ASTELIN) 0.1 % nasal spray Place 2 sprays into both nostrils 2 (two) times daily. 03/10/21   Dara Hoyer, FNP  baclofen (LIORESAL) 10 MG tablet Take 10 mg by mouth daily as needed for muscle spasms. 06/03/20   [provider]  buPROPion (WELLBUTRIN XL) 150 MG 24 hr tablet TAKE ONE (1) TABLET BY MOUTH EACH DAY  05/01/22   Thayer Headings, PMHNP  CALCIUM PO Take 1 tablet by mouth in the morning and at bedtime.    [provider]  carbidopa-levodopa (SINEMET IR) 25-100 MG tablet TAKE AS NEEDED FOR ESVERE RESTLESS LEG SYNDROME 04/13/22   Narda Amber K, DO  cholecalciferol (VITAMIN D3) 25 MCG (1000 UNIT) tablet Take 1,000 Units by mouth daily.    [provider]  COVID-19 mRNA vaccine [phone] (COMIRNATY) syringe Inject into the muscle. 06/03/22   Carlyle Basques, MD  cycloSPORINE (RESTASIS) 0.05 % ophthalmic emulsion Place 1 drop into both eyes in the morning, at noon, in the evening, and at bedtime.    [provider]  dexlansoprazole (DEXILANT) 60 MG capsule Take 1 capsule (60 mg total) by mouth daily with breakfast. 04/30/22   Esterwood, Amy S, PA-C  famotidine (PEPCID) 20 MG tablet Take 1 tablet (20 mg total) by mouth at bedtime. 04/30/22   Esterwood, Amy S, PA-C  ferrous sulfate 325 (65 FE) MG tablet Take 650 mg by mouth daily with breakfast.    [provider]  GAMMAGARD 20 GM/200ML SOLN Inject into the vein. Every 21 days 02/27/22   [provider]  heparin lock flush 100 UNIT/ML SOLN injection Inject 500 Units into the vein every 21 ( twenty-one) days. 03/31/21   [provider]  hydrOXYzine (VISTARIL) 25 MG capsule Take 25 mg by mouth at bedtime. 03/07/22   [provider]  ibuprofen (ADVIL) 200 MG tablet Take  600 mg by mouth as needed for mild pain.    [provider]  immune globulin, human, (GAMMAGARD S/D LESS IGA) 10 g injection Inject 2 g/kg into the vein every 21 ( twenty-one) days.    [provider]  influenza vaccine adjuvanted (FLUAD QUADRIVALENT) 0.5 ML injection Inject into the muscle. 06/03/22   Carlyle Basques, MD  lamoTRIgine (LAMICTAL) 200 MG tablet Take 1 tablet (200 mg total) by mouth at bedtime. 10/10/21 03/08/22  Thayer Headings, PMHNP  levothyroxine (SYNTHROID) 75 MCG tablet TAKE ONE (1) TABLET BY MOUTH  EACH DAY 30MINUTES BEFORE BREAKFAST ON EMPTY STOMACH 03/12/22   [name], NP  magnesium oxide (MAG-OX) 400 MG tablet Take 400 mg by mouth daily.     [provider]  melatonin 5 MG TABS Take 5 mg by mouth.    [provider]  mirtazapine (REMERON) 30 MG tablet Take 1 tablet (30 mg total) by mouth at bedtime. 08/28/21 03/08/22  Thayer Headings, PMHNP  mupirocin ointment (BACTROBAN) 2 % Apply 1 application. topically 2 (two) times daily. 12/29/21   [name], NP  ondansetron (ZOFRAN) 4 MG tablet Take 1 tablet (4 mg total) by mouth every 8 (eight) hours as needed for nausea or vomiting. 04/01/22   Wardell Honour, MD  oxybutynin (DITROPAN) 5 MG tablet Take 2.5 mg by mouth 2 (two) times daily. 04/28/22   [provider]  oxybutynin (DITROPAN) 5 MG/5ML syrup TAKE 2.5 MLS BY MOUTH TWICE A DAY 09/09/21   [name], NP  potassium chloride (KLOR-CON) 10 MEQ tablet Take 1 tablet (10 mEq total) by mouth 4 (four) times daily. 05/04/22   Shelda Pal, DO  rOPINIRole (REQUIP) 1 MG tablet Take 0.5 tablet at 2p and 1.5 tablet 2-3 hour prior to bedtime. 11/14/21   Narda Amber K, DO  RSV vaccine recomb adjuvanted (AREXVY) 120 MCG/0.5ML injection Inject into the muscle. 06/11/22   Carlyle Basques, MD  sodium chloride, PF, 0.9 % injection Inject 5-10 mLs into the vein as needed (Immunoglobulin). 02/12/21   [provider]  tamsulosin (FLOMAX) 0.4 MG CAPS capsule Take 1 capsule (0.4 mg total) by mouth daily. 01/23/21   Shelly Coss, MD  torsemide (DEMADEX) 20 MG tablet Take 20 mg by mouth daily.    [provider]  traMADol (ULTRAM) 50 MG tablet Take 1 tablet (50 mg total) by mouth every 12 (twelve) hours as needed (Pain). 06/15/22   Shelda Pal, DO  triamcinolone cream (KENALOG) 0.1 % Apply 1 application topically 2 (two) times daily as needed (rash).    [provider]  UNABLE TO FIND Take 1 tablet by mouth daily. Med  Name: Megan Brewer 1 by mouth daily for UTI prevention    [provider]  vitamin B-12 (CYANOCOBALAMIN) 1000 MCG tablet TAKE 1 TABLET BY MOUTH DAILY 02/14/21   [provider]  ZINC OXIDE, TOPICAL, 10 % CREA Apply 1 application topically 2 (two) times daily as needed (rash). 02/14/21   Aline August, MD      Allergies    Imitrex [sumatriptan], Tetracycline, Corticosteroids, Cefixime, Ciprofloxacin, Doxycycline, Gabapentin, Isometheptene-dichloral-apap, Ketorolac, Prednisone, Promethazine, Stadol [butorphanol], Tape, Erythromycin base, and Propoxyphene    Review of Systems   Review of Systems  Musculoskeletal:  Positive for arthralgias, back pain, myalgias and neck pain.  All other systems reviewed and are negative.   Physical Exam Updated Vital Signs BP 115/60   Pulse 80   Temp 98 F (36.7 C) (Oral)  Resp 12   Ht 5\' 4"  (1.626 m)   Wt 62.6 kg   SpO2 98%   BMI 23.69 kg/m  Physical Exam Vitals and nursing note reviewed.  Constitutional:      General: She is not in acute distress.    Appearance: She is well-developed.  HENT:     Head: Normocephalic and atraumatic.  Eyes:     Conjunctiva/sclera: Conjunctivae normal.  Cardiovascular:     Rate and Rhythm: Normal rate and regular rhythm.     Heart sounds: No murmur heard. Pulmonary:     Effort: Pulmonary effort is normal. No respiratory distress.     Breath sounds: Normal breath sounds.  Abdominal:     Palpations: Abdomen is soft.     Tenderness: There is no abdominal tenderness.  Musculoskeletal:        General: No swelling.     Cervical back: Neck supple.  Skin:    General: Skin is warm and dry.     Capillary Refill: Capillary refill takes less than 2 seconds.  Neurological:     Mental Status: She is alert.  Psychiatric:        Mood and Affect: Mood normal.     ED Results / Procedures / Treatments   Labs (all labs ordered are listed, but only abnormal results are displayed) Labs Reviewed  CBC -  Abnormal; Notable for the following components:      Result Value   WBC 12.3 (*)    All other components within normal limits  BASIC METABOLIC PANEL - Abnormal; Notable for the following components:   BUN 29 (*)    Creatinine, Ser 1.64 (*)    GFR, Estimated 33 (*)    All other components within normal limits    EKG None  Radiology No results found.  Procedures Procedures   Medications Ordered in ED Medications  ketamine (KETALAR) injection 19 mg (19 mg Intravenous Given 06/20/22 1009)  ketorolac (TORADOL) 15 MG/ML injection 15 mg (15 mg Intravenous Given 06/20/22 1009)  fentaNYL (SUBLIMAZE) injection 50 mcg (50 mcg Intravenous Given 06/20/22 1156)  heparin lock flush 100 unit/mL (500 Units Intracatheter Given 06/20/22 1158)    ED Course/ Medical Decision Making/ A&P                           Medical Decision Making Amount and/or Complexity of Data Reviewed Labs: ordered.  Risk Prescription drug management.   76 year old female with history of chronic pain, fibromyalgia presents to ED for evaluation of pain.  Please see HPI for further details.  On examination patient afebrile however she is tachycardic.  I suspect this is secondary to pain.  The patient is not hypoxic, her lungs are clear bilaterally.  Patient abdomen soft and compressible.  Patient neurological examination shows no focal neurodeficits.  Patient lab work collected include CBC, BMP.  Patient lab work is at baseline.  The patient was treated initially with 19 mg IV ketamine.  Patient reports immediate cessation of pain after ketamine was administered.  The patient is now shown to be resting comfortably, her pulse rate is 81.  Patient observed for a total time period of 2 hours in the ED.  The patient reports that her pain has subsided at this time.  The patient was given an additional dose of 50 mcg of fentanyl prior to discharge.  The patient will be sent home with steroid and advised to follow-up with her  PCP on  Monday as previously scheduled.  The patient voices understanding of my instructions.  Return precautions provided, understanding was voiced.  Patient safe for discharge.   Final Clinical Impression(s) / ED Diagnoses Final diagnoses:  Chronic pain syndrome    Rx / DC Orders ED Discharge Orders          Ordered    predniSONE (DELTASONE) 10 MG tablet  Daily        06/20/22 1147              Azucena Cecil, PA-C 06/20/22 1305    Davonna Belling, MD 06/21/22 [phone]

## 2022-06-20 NOTE — Discharge Instructions (Addendum)
Please return to the ED with any new or worsening signs or symptoms Please follow-up with your PCP for further management Please pick up prescription medication I have sent in for you

## 2022-06-22 ENCOUNTER — Telehealth: Payer: Self-pay

## 2022-06-22 NOTE — Telephone Encounter (Signed)
Patient called to speak with nurse. She was seen in ED on 11/4 and wants to know what she should do next.

## 2022-06-22 NOTE — Telephone Encounter (Signed)
Pt seen in ED 

## 2022-06-22 NOTE — Telephone Encounter (Signed)
Nurse Assessment Nurse: Cristino Martes, RN, Joelene Millin Date/Time (Eastern Time): 06/20/2022 8:53:20 AM Confirm and document reason for call. If symptomatic, describe symptoms. ---Caller states his wife is in a lot of pain (9-10/10) goes from the base of her skull to her waist. She was diagnosed in August with Fibromyalgia and PMR. Mainly in her neck, back and shoulders bilaterally. Started a couple days ago. The medication : Tramadol and steroids given and has for it is not helping at all. In a recliner currently. Able to go to restroom but very slowly. Does the patient have any new or worsening symptoms? ---Yes Will a triage be completed? ---Yes Related visit to physician within the last 2 weeks? ---Yes Does the PT have any chronic conditions? (i.e. diabetes, asthma, this includes High risk factors for pregnancy, etc.) ---Yes List chronic conditions. ---PMR, FM, Antibody issue IVIG every 3 weeks Is this a behavioral health or substance abuse call? ---No PLEASE NOTE: All timestamps contained within this report are represented as [place] Standard Time. CONFIDENTIALTY NOTICE: This fax transmission is intended only for the addressee. It contains information that is legally privileged, confidential or otherwise protected from use or disclosure. If you are not the intended recipient, you are strictly prohibited from reviewing, disclosing, copying using or disseminating any of this information or taking any action in reliance on or regarding this information. If you have received this fax in error, please notify us immediately by telephone so that we can arrange for its return to [place]. Phone: [phone], Toll-Free: [phone], Fax: [phone] Page: 2 of 2 Call Id: [id number] Guidelines Guideline Title Affirmed Question Affirmed Notes Nurse Date/Time Eilene Ghazi Time) Back Pain [1] SEVERE back pain (e.g., excruciating) AND [2] sudden onset AND [3] age > 93 years [place],  RN, Joelene Millin 06/20/2022 8:57:58 AM Disp. Time Eilene Ghazi Time) Disposition Final User 06/20/2022 8:59:36 AM Go to ED Now Yes Cristino Martes, RN, Joelene Millin Final Disposition 06/20/2022 8:59:36 AM Go to ED Now Yes Stripling, RN, Renea Ee Disagree/Comply Comply Caller Understands Yes PreDisposition InappropriateToAsk Care Advice Given Per Guideline GO TO ED NOW: * You need to be seen in the Emergency Department. * Go to the ED at ___________ [place] now. Drive carefully. ANOTHER ADULT SHOULD DRIVE: * It is better and safer if another adult drives instead of you. * Bring a list of your current medicines when you go to the Emergency Department (ER). BRING MEDICINES: CARE ADVICE given per Back Pain (Adult) guideline. Referrals GO TO FACILITY OTHER - SPECIFY

## 2022-06-22 NOTE — Telephone Encounter (Signed)
She needs an ED followup appt.st

## 2022-06-23 ENCOUNTER — Ambulatory Visit: Payer: Medicare PPO | Admitting: Family Medicine

## 2022-06-23 ENCOUNTER — Encounter: Payer: Self-pay | Admitting: Family Medicine

## 2022-06-23 ENCOUNTER — Other Ambulatory Visit: Payer: Self-pay | Admitting: Family Medicine

## 2022-06-23 VITALS — BP 128/72 | HR 81 | Temp 97.7°F | Ht 64.0 in | Wt 143.0 lb

## 2022-06-23 DIAGNOSIS — R251 Tremor, unspecified: Secondary | ICD-10-CM | POA: Diagnosis not present

## 2022-06-23 DIAGNOSIS — G8929 Other chronic pain: Secondary | ICD-10-CM

## 2022-06-23 MED ORDER — TRAMADOL HCL 50 MG PO TABS: 100.0000 mg | ORAL_TABLET | Freq: Two times a day (BID) | ORAL | 2 refills | Status: DC | PRN

## 2022-06-23 MED ORDER — PROPRANOLOL HCL ER 60 MG PO CP24: 60.0000 mg | ORAL_CAPSULE | Freq: Every day | ORAL | 1 refills | Status: DC

## 2022-06-23 NOTE — Progress Notes (Signed)
Chief Complaint  Patient presents with   Follow-up    ER     Subjective: Patient is a 76 y.o. female here for ED f/u.  Went to ED for acute on chronic pain syndrome. Received fentanyl and ketamine but felt great. Tramadol has helped a little along w ibuprofen and 20 mg of prednisone.  She is in talks with her rheumatology team inquiring about Plaquenil.  They think it is fibromyalgia though.  She has never been on Lyrica for fibromyalgia or pain.  Elavil helps with sleep but not so much pain.  Patient has a history of a tremor which she reports is both resting and when she moves.  She is taking Sinemet for restless leg syndrome but notices no improvement with her shaking.  She has never been on propranolol or primidone.  Her follow-up with the neurology team is in early January.  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[place])    Lung cancer ([place]) 2011, 2014   Memory loss    Migraine    Opioid abuse (HCC)    Osteoporosis    Presence of neurostimulator    Restless legs    Sleep apnea     Objective: BP 128/72 (BP Location: Left Arm, Patient Position: Sitting, Cuff Size: Normal)   Pulse 81   Temp 97.7 F (36.5 C) (Oral)   Ht 5\' 4"  (1.626 m)   Wt 143 lb (64.9 kg)   SpO2 97%   BMI 24.55 kg/m  General: Awake, appears stated age Lungs: No accessory muscle use Neuro: +resting and intentional tremor in all 4 extremities Psych: Age appropriate judgment and insight, normal affect and mood  Assessment and Plan: Other chronic pain - Plan: traMADol (ULTRAM) 50 MG tablet  Tremor - Plan: propranolol ER (INDERAL LA) 60 MG 24 hr capsule  Chronic, uncontrolled.   Increase tramadol from 100 mg daily to 100 mg twice daily.  Continue ibuprofen as needed.  Encouraged her to reach out to rheumatology team regarding Plaquenil.  Could consider Lyrica 25 mg twice daily in addition to the above treatment if she has not improved by next month. Chronic, uncontrolled.  Continue Sinemet as prescribed by neurology.  Start Inderal long-acting 60 mg daily.  If no improvement, would change her to primidone for a month until she sees her neurologist. The patient voiced understanding and agreement to the plan.  [place], DO 06/23/22  4:33 PM

## 2022-06-25 ENCOUNTER — Telehealth: Payer: Self-pay | Admitting: Pharmacy Technician

## 2022-06-25 ENCOUNTER — Other Ambulatory Visit: Payer: Self-pay | Admitting: Pharmacy Technician

## 2022-06-25 NOTE — Telephone Encounter (Addendum)
Dr. Nani Ravens, Juluis Rainier note:  Auth Submission: NO AUTH NEEDED Payer: [employer] MEDIC[employer] Medication & CPT/J Code(s) submitted: Solumedrol (Methylprednisolone) J2930 Route of submission (phone, fax, portal):  Phone # Fax # Auth type: Buy/Bill Units/visits requested: 1gm [id number] Reference number:  Approval from: 06/25/22 to 08/16/22   Patient will be scheduled as soon as possible.  08/13/22 - Medicare part b and supplement eligibility verified and approval extended till 08/17/23

## 2022-06-26 ENCOUNTER — Telehealth: Payer: Self-pay | Admitting: Family Medicine

## 2022-06-26 ENCOUNTER — Telehealth: Payer: Self-pay | Admitting: Psychiatry

## 2022-06-26 ENCOUNTER — Other Ambulatory Visit: Payer: Self-pay

## 2022-06-26 ENCOUNTER — Other Ambulatory Visit (HOSPITAL_COMMUNITY): Payer: Self-pay

## 2022-06-26 DIAGNOSIS — F419 Anxiety disorder, unspecified: Secondary | ICD-10-CM

## 2022-06-26 DIAGNOSIS — F5105 Insomnia due to other mental disorder: Secondary | ICD-10-CM

## 2022-06-26 MED ORDER — MIRTAZAPINE 30 MG PO TABS: 30.0000 mg | ORAL_TABLET | Freq: Every day | ORAL | 0 refills | Status: DC

## 2022-06-26 MED ORDER — PREDNISONE 10 MG PO TABS: 20.0000 mg | ORAL_TABLET | Freq: Every day | ORAL | 0 refills | Status: DC

## 2022-06-26 MED ORDER — HYDROXYZINE PAMOATE 25 MG PO CAPS: 25.0000 mg | ORAL_CAPSULE | Freq: Every day | ORAL | 0 refills | Status: DC

## 2022-06-26 NOTE — Telephone Encounter (Signed)
Rx sent 

## 2022-06-26 NOTE — Telephone Encounter (Signed)
Pt made an appt for 11/28 she needs a refill on her mirtazapine 30 mg and hydroxyzine 25 mg. Pharmacy is [facility]

## 2022-06-26 NOTE — Telephone Encounter (Signed)
Medication:   predniSONE (DELTASONE) 10 MG tablet [[id number]]   Has the patient contacted their pharmacy? No. (If no, request that the patient contact the pharmacy for the refill.) (If yes, when and what did the pharmacy advise?)  Preferred Pharmacy (with phone number or street name):   [facility], [place] - [address] [place], [place] [address] Phone: [phone]  Fax: [phone]   Agent: Please be advised that RX refills may take up to 3 business days. We ask that you follow-up with your pharmacy.  Pt also wanted to let Shirlean Mylar know that she I currently getting her infusions worked out with the infusion office.

## 2022-06-29 ENCOUNTER — Other Ambulatory Visit (HOSPITAL_COMMUNITY): Payer: Self-pay

## 2022-06-30 ENCOUNTER — Ambulatory Visit (INDEPENDENT_AMBULATORY_CARE_PROVIDER_SITE_OTHER): Payer: Medicare PPO

## 2022-06-30 VITALS — BP 99/59 | HR 66 | Temp 97.5°F | Resp 18 | Ht 64.0 in | Wt 144.4 lb

## 2022-06-30 DIAGNOSIS — M353 Polymyalgia rheumatica: Secondary | ICD-10-CM | POA: Diagnosis not present

## 2022-06-30 DIAGNOSIS — M2550 Pain in unspecified joint: Secondary | ICD-10-CM

## 2022-06-30 MED ORDER — SODIUM CHLORIDE 0.9 % IV SOLN
1000.0000 mg | Freq: Once | INTRAVENOUS | Status: AC
  Administered 2022-06-30: 1000 mg via INTRAVENOUS
  Filled 2022-06-30: qty 16

## 2022-06-30 MED ORDER — HEPARIN SOD (PORK) LOCK FLUSH 100 UNIT/ML IV SOLN
500.0000 [IU] | Freq: Once | INTRAVENOUS | Status: AC | PRN
  Administered 2022-06-30: 500 [IU]
  Filled 2022-06-30: qty 5

## 2022-06-30 NOTE — Progress Notes (Deleted)
Diagnosis: Polyarthralgia Rheumatica  Provider:  Marshell Garfinkel MD  Procedure: Infusion  IV Type: Port a Cath, IV Location: R Chest  Solumedrol (Methylprednisolone), Dose: 1000 mg  Infusion Start Time: 5974  Infusion Stop Time: 1638  Post Infusion IV Care: Peripheral IV Discontinued  Discharge: Condition: Good, Destination: Home . AVS provided to patient.   Performed by:  Arnoldo Morale, RN

## 2022-06-30 NOTE — Progress Notes (Signed)
Diagnosis: Polyarthralgia Rheumatica  Provider:  Marshell Garfinkel MD  Procedure: Infusion  IV Type: Port a Cath, IV Location: R Chest  Solumedrol (Methylprednisolone), Dose: 1000 mg  Infusion Start Time: 2831  Infusion Stop Time: 5176  Post Infusion IV Care: Port a Cath Deaccessed/Flushed and Heparin locked per protocol  Discharge: Condition: Good, Destination: Home . AVS provided to patient.   Performed by:  Arnoldo Morale, RN

## 2022-07-06 ENCOUNTER — Telehealth: Payer: Self-pay | Admitting: Family Medicine

## 2022-07-06 NOTE — Telephone Encounter (Signed)
Patient requested a callback from [place]. She said she needs two antibiotics called in. Patient did not elaborate.

## 2022-07-06 NOTE — Telephone Encounter (Signed)
Called the patient and she stated she has a sinus infection and would like something sent in for that. The 2nd is for flagyl so she does not get C-diff from the antibiotic for a sinus infection.

## 2022-07-07 ENCOUNTER — Telehealth (INDEPENDENT_AMBULATORY_CARE_PROVIDER_SITE_OTHER): Payer: Medicare PPO | Admitting: Family Medicine

## 2022-07-07 ENCOUNTER — Encounter: Payer: Self-pay | Admitting: Family Medicine

## 2022-07-07 DIAGNOSIS — J0140 Acute pansinusitis, unspecified: Secondary | ICD-10-CM | POA: Diagnosis not present

## 2022-07-07 MED ORDER — METRONIDAZOLE 500 MG PO TABS: 500.0000 mg | ORAL_TABLET | Freq: Two times a day (BID) | ORAL | 0 refills | Status: AC

## 2022-07-07 MED ORDER — AMOXICILLIN-POT CLAVULANATE 875-125 MG PO TABS: 1.0000 | ORAL_TABLET | Freq: Two times a day (BID) | ORAL | 0 refills | Status: AC

## 2022-07-07 NOTE — Telephone Encounter (Signed)
Called and scheduled for a video visit (telephone call ) at 12:30 for Sinus infection

## 2022-07-07 NOTE — Progress Notes (Signed)
CC: sinus issues  Megan Brewer here for URI complaints. Due to COVID-19 pandemic, we are interacting via telephone. I verified patient's ID using 2 identifiers. Patient agreed to proceed with visit via this method. Patient is at home, I am at office. Patient and I are present for visit.   Duration: 2 weeks, getting worse Associated symptoms: sinus congestion, sinus pain, rhinorrhea, and dental pain Denies: itchy watery eyes, ear pain, ear drainage, sore throat, wheezing, shortness of breath, and fevers Treatment to date: pushing fluids, Tylenol, Mucinex, ibuprofen Sick contacts: No  Past Medical History:  Diagnosis Date   Anemia    Arachnoiditis    Bipolar disorder ([place])    Cataract    removed   Chronic post-thoracotomy pain    CKD (chronic kidney disease)    GERD (gastroesophageal reflux disease)    Hypogammaglobulinemia (HCC)    Hypotension    Hypothyroid    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HCC)    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 She has a hx of C diff and always takes Flagyl with it. Will send n 7 d of that. Reports last time she didn't take Flagyl, she had a bout of C diff that landed her in hospital for nearly a month. Continue to push fluids, practice good hand hygiene, cover mouth when coughing. F/u prn. If starting to experience fevers, shaking, or shortness of breath, seek immediate care. Total time: 11 min Pt voiced understanding and agreement to the plan.  [place], DO 07/07/22 11:36 AM

## 2022-07-13 ENCOUNTER — Encounter: Payer: Self-pay | Admitting: Family Medicine

## 2022-07-13 ENCOUNTER — Other Ambulatory Visit: Payer: Self-pay | Admitting: Family Medicine

## 2022-07-13 ENCOUNTER — Other Ambulatory Visit: Payer: Self-pay | Admitting: Nurse Practitioner

## 2022-07-13 MED ORDER — SUCRALFATE 1 GM/10ML PO SUSP: 1.0000 g | Freq: Four times a day (QID) | ORAL | 3 refills | Status: DC | PRN

## 2022-07-14 ENCOUNTER — Encounter: Payer: Self-pay | Admitting: Psychiatry

## 2022-07-14 ENCOUNTER — Ambulatory Visit (INDEPENDENT_AMBULATORY_CARE_PROVIDER_SITE_OTHER): Payer: Medicare PPO | Admitting: Psychiatry

## 2022-07-14 DIAGNOSIS — F419 Anxiety disorder, unspecified: Secondary | ICD-10-CM

## 2022-07-14 DIAGNOSIS — F3162 Bipolar disorder, current episode mixed, moderate: Secondary | ICD-10-CM

## 2022-07-14 DIAGNOSIS — F99 Mental disorder, not otherwise specified: Secondary | ICD-10-CM

## 2022-07-14 DIAGNOSIS — F5105 Insomnia due to other mental disorder: Secondary | ICD-10-CM | POA: Diagnosis not present

## 2022-07-14 DIAGNOSIS — G8929 Other chronic pain: Secondary | ICD-10-CM

## 2022-07-14 MED ORDER — MIRTAZAPINE 30 MG PO TABS: 30.0000 mg | ORAL_TABLET | Freq: Every day | ORAL | 1 refills | Status: DC

## 2022-07-14 MED ORDER — BUPROPION HCL ER (XL) 150 MG PO TB24: ORAL_TABLET | ORAL | 1 refills | Status: DC

## 2022-07-14 MED ORDER — AMITRIPTYLINE HCL 25 MG PO TABS: 25.0000 mg | ORAL_TABLET | Freq: Every day | ORAL | 2 refills | Status: DC

## 2022-07-14 MED ORDER — LAMOTRIGINE 200 MG PO TABS: 200.0000 mg | ORAL_TABLET | Freq: Every day | ORAL | 1 refills | Status: DC

## 2022-07-14 NOTE — Progress Notes (Signed)
Megan Brewer [id number] 1946-05-08 76 y.o.  Subjective:   Patient ID:  Megan Brewer is a 76 y.o. (DOB 1946/05/05) female.  Chief Complaint:  Chief Complaint  Patient presents with   Anxiety   Depression   Insomnia    HPI Soni Kegel Causer presents to the office today for follow-up of anxiety, mood disturbance, and insomnia.   She reports that she has had severe health issues that caused severe fatigue and diarrhea. She has also had difficulty with coordination. She reports that someone dx'd her with polymyalgia rheumatica. She reports that 3 weeks ago she was given a steroid infusion. She reports that this helped with her pain but is causing side effects. She has learned about a possible treatment option which has given her some "hope."   She reports that she has had severe fatigue. She reports that she has not been able to wash her hair or attend to other self-care. She reports difficulty with concentration. She reports that she has been trying to have a positive outlook and "cope." She reports that she is saddened that she cannot help family. She reports anxiety about how her health is affecting her family. "I'm not nervous, I'm beyond that." She reports "dread" about having contact with others. Denies panic attacks. She reports that she has periods of shortness of breath at times with anxiety. She reports that her sleep varies and does not follow a particular pattern. Appetite varies.   She reports passive death wishes. Denies SI.   Cymbalta was re-tried without improvement in pain. She was then started on Amitriptyline. She reports that at times it seems to help with sleep and other times it does not seem to affect sleep. She denies any benefit with Amitriptyline on her mood.   Past Psychiatric Medication Trials: Sertraline- Was effective for about 25 years and no longer seems to be as effective. Prozac Paxil Cymbalta- may have caused psychosis. No improvement with  pain.  Remeron-Helpful for her mood and caused her legs to give out.  Depakote Trileptal- shaking and night sweats Gabapentin- Had throat swelling Lithium- Edema. Minimally effective Seroquel Risperidone Zyprexa- Effective for insomnia, anxiety, and mood. Excessive wt gain.  Saphris Lamotrigine Melatonin Ativan- Reports taking until 2 months ago. Valium- "great for my depression." Xanax Trazodone- Ineffective Requip- Took for years at 0.5 mg po qd. Ingrezza- multiple side effects    AIMS    Flowsheet Row Office Visit from 02/25/2021 in [place] Office Visit from 06/14/2020 in [place] Visit from 06/23/2019 in [place] Total Score 5 15 0      PHQ2-9    [place] Office Visit from 06/11/2022 in [place] from 09/30/2021 in [facility] and Adult Medicine Office Visit from 02/04/2021 in [facility] for Infectious Disease Clinical Support from 09/27/2020 in [facility] and Adult Medicine Office Visit from 06/10/2020 in [facility] and Adult Medicine  PHQ-2 Total Score 6 0 0 0 0  PHQ-9 Total Score 23 -- -- -- --      Flowsheet Row ED from 06/20/2022 in [facility] from 04/21/2022 in [place] [facility] from 03/24/2022 in [place] No Risk No Risk No Risk        Review of Systems:  Review of Systems  Musculoskeletal:  Positive for arthralgias. Negative for gait problem.  Neurological:  Positive for tremors.  Psychiatric/Behavioral:         Please refer to HPI    Medications: I have reviewed the patient's current medications.  Current Outpatient Medications  Medication Sig Dispense Refill   acetaminophen (TYLENOL) 500 MG tablet Take 500 mg by mouth every 6 (six) hours as needed for moderate pain.      azelastine (ASTELIN) 0.1 % nasal spray Place 2 sprays into both nostrils 2 (two) times daily. 30 mL 5   baclofen (LIORESAL) 10 MG tablet Take 10 mg by mouth daily as needed for muscle spasms.     CALCIUM PO Take 1 tablet by mouth in the morning and at bedtime.     carbidopa-levodopa (SINEMET IR) 25-100 MG tablet TAKE AS NEEDED FOR ESVERE RESTLESS LEG SYNDROME 30 tablet 0   cholecalciferol (VITAMIN D3) 25 MCG (1000 UNIT) tablet Take 1,000 Units by mouth daily.     cycloSPORINE (RESTASIS) 0.05 % ophthalmic emulsion Place 1 drop into both eyes in the morning, at noon, in the evening, and at bedtime.     dexlansoprazole (DEXILANT) 60 MG capsule Take 1 capsule (60 mg total) by mouth daily with breakfast. 30 capsule 11   famotidine (PEPCID) 20 MG tablet Take 1 tablet (20 mg total) by mouth at bedtime. 30 tablet 11   ferrous sulfate 325 (65 FE) MG tablet Take 650 mg by mouth daily with breakfast.     GAMMAGARD 20 GM/200ML SOLN Inject into the vein. Every 21 days     ibuprofen (ADVIL) 200 MG tablet Take 600 mg by mouth as needed for mild pain.     immune globulin, human, (GAMMAGARD S/D LESS IGA) 10 g injection Inject 2 g/kg into the vein every 21 ( twenty-one) days.     levothyroxine (SYNTHROID) 75 MCG tablet TAKE ONE (1) TABLET BY MOUTH EACH DAY 30MINUTES BEFORE BREAKFAST ON EMPTY STOMACH 90 tablet 1   magnesium oxide (MAG-OX) 400 MG tablet Take 400 mg by mouth daily.      melatonin 5 MG TABS Take 5 mg by mouth.     mupirocin ointment (BACTROBAN) 2 % Apply 1 application. topically 2 (two) times daily. 22 g 0   ondansetron (ZOFRAN) 4 MG tablet Take 1 tablet (4 mg total) by mouth every 8 (eight) hours as needed for nausea or vomiting. 20 tablet 0   potassium chloride (KLOR-CON) 10 MEQ tablet Take 1 tablet (10 mEq total) by mouth 4 (four) times daily. 360 tablet 1   propranolol ER (INDERAL LA) 60 MG 24 hr capsule Take 1 capsule (60 mg total) by mouth daily. 30 capsule 1   rOPINIRole (REQUIP) 1 MG tablet  Take 0.5 tablet at 2p and 1.5 tablet 2-3 hour prior to bedtime. 180 tablet 3   sucralfate (CARAFATE) 1 GM/10ML suspension Take 10 mLs (1 g total) by mouth 4 (four) times daily as needed. 420 mL 3   tamsulosin (FLOMAX) 0.4 MG CAPS capsule Take 1 capsule (0.4 mg total) by mouth daily. 30 capsule 1   torsemide (DEMADEX) 20 MG tablet Take 20 mg by mouth daily.     traMADol (ULTRAM) 50 MG tablet Take 2 tablets (100 mg total) by mouth every 12 (twelve) hours as needed (Pain). 120 tablet 2   TURMERIC PO Take by mouth.     UNABLE TO FIND Take 1 tablet by mouth daily. Med Name: [name] 1 by mouth daily for UTI prevention     vitamin B-12 (CYANOCOBALAMIN) 1000 MCG tablet TAKE 1  TABLET BY MOUTH DAILY     ZINC OXIDE, TOPICAL, 10 % CREA Apply 1 application topically 2 (two) times daily as needed (rash).     amitriptyline (ELAVIL) 25 MG tablet Take 1 tablet (25 mg total) by mouth at bedtime. 30 tablet 2   amoxicillin-clavulanate (AUGMENTIN) 875-125 MG tablet Take 1 tablet by mouth 2 (two) times daily for 7 days. (Patient not taking: Reported on 07/14/2022) 14 tablet 0   buPROPion (WELLBUTRIN XL) 150 MG 24 hr tablet TAKE ONE (1) TABLET BY MOUTH EACH DAY 90 tablet 1   COVID-19 mRNA vaccine 2023-2024 (COMIRNATY) syringe Inject into the muscle. 0.3 mL 0   heparin lock flush 100 UNIT/ML SOLN injection Inject 500 Units into the vein every 21 ( twenty-one) days.     influenza vaccine adjuvanted (FLUAD QUADRIVALENT) 0.5 ML injection Inject into the muscle. 0.5 mL 0   lamoTRIgine (LAMICTAL) 200 MG tablet Take 1 tablet (200 mg total) by mouth at bedtime. 90 tablet 1   metroNIDAZOLE (FLAGYL) 500 MG tablet Take 1 tablet (500 mg total) by mouth 2 (two) times daily for 7 days. Do not drink alcohol. (Patient not taking: Reported on 07/14/2022) 14 tablet 0   mirtazapine (REMERON) 30 MG tablet Take 1 tablet (30 mg total) by mouth at bedtime. 90 tablet 1   predniSONE (DELTASONE) 10 MG tablet Take 2 tablets (20 mg total) by mouth  daily. (Patient not taking: Reported on 07/14/2022) 12 tablet 0   RSV vaccine recomb adjuvanted (AREXVY) 120 MCG/0.5ML injection Inject into the muscle. 1 each 0   sodium chloride, PF, 0.9 % injection Inject 5-10 mLs into the vein as needed (Immunoglobulin).     triamcinolone cream (KENALOG) 0.1 % Apply 1 application topically 2 (two) times daily as needed (rash).     No current facility-administered medications for this visit.    Medication Side Effects: Other: Reports side effects with steroids.   Allergies:  Allergies  Allergen Reactions   Imitrex [Sumatriptan] Nausea And Vomiting    Ringing in the ears, migraines are worse   Tetracycline Nausea Only    Causes ringing in ears, dizziness, migraine, nausea   Corticosteroids Other (See Comments)    12/02/2016 interview by PYP: Oral dosing causes migraines/nausea/tinnitus. Prev tolerated IV and intranasal admin w/o difficulty.   Cefixime Other (See Comments)    Headache    Ciprofloxacin Other (See Comments)    Headache, dizziness, ringing in the ears   Doxycycline Other (See Comments)    unknown   Gabapentin     Throat swells/closes   Isometheptene-Dichloral-Apap Other (See Comments)    unknown   Ketorolac     12/02/2016 interview by PJK: Oral dosing prednisone/corticosteroids causes migraines/nausea/tinnitus. Prev tolerated IV and intranasal admin w/o difficulty.   Prednisone Other (See Comments)    Migraine, dizzy, ringing in ears-can take it IV 12/02/2016 interview by [name]: Oral prednisone dosing causes migraines/nausea/tinnitus. Prev tolerated IV and intranasal admin w/o difficulty.   Promethazine Other (See Comments)    causes severe abdominal pain when taken IV; however she can take PO or IM   Stadol [Butorphanol]     Cerebral pain   Tape Other (See Comments)    Adhesive tapes-patient denies   Erythromycin Base Diarrhea and Itching    Severe diarrhea   Propoxyphene Nausea Only    Past Medical History:  Diagnosis  Date   Anemia    Arachnoiditis    Bipolar disorder ([place])    Cataract    removed  Chronic post-thoracotomy pain    CKD (chronic kidney disease)    GERD (gastroesophageal reflux disease)    Hypogammaglobulinemia (HCC)    Hypotension    Hypothyroid    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HCC)    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   Please see review of systems for further details on the patient's review from today.   Objective:   Physical Exam:  There were no vitals taken for this visit.  Physical Exam Constitutional:      General: She is not in acute distress. Musculoskeletal:        General: No deformity.  Neurological:     Mental Status: She is alert and oriented to person, place, and time.     Coordination: Coordination normal.  Psychiatric:        Attention and Perception: Attention and perception normal. She does not perceive auditory or visual hallucinations.        Mood and Affect: Mood is anxious and depressed. Affect is blunt. Affect is not labile, angry or inappropriate.        Speech: Speech normal.        Behavior: Behavior normal.        Thought Content: Thought content normal. Thought content is not paranoid or delusional. Thought content does not include homicidal or suicidal ideation. Thought content does not include homicidal or suicidal plan.        Cognition and Memory: Cognition and memory normal.        Judgment: Judgment normal.     Comments: Insight intact     Lab Review:     Component Value Date/Time   NA 138 06/20/2022 0958   NA 140 11/20/2021 0956   K 4.1 06/20/2022 0958   CL 102 06/20/2022 0958   CO2 26 06/20/2022 0958   GLUCOSE 98 06/20/2022 0958   BUN 29  (H) 06/20/2022 0958   BUN 17 11/20/2021 0956   CREATININE 1.64 (H) 06/20/2022 0958   CREATININE 1.55 (H) 04/01/2022 0917   CALCIUM 9.3 06/20/2022 0958   PROT 7.5 03/08/2022 1242   ALBUMIN 3.5 03/08/2022 1242   AST 28 03/08/2022 1242   ALT 25 03/08/2022 1242   ALKPHOS 81 03/08/2022 1242   BILITOT 0.7 03/08/2022 1242   GFRNONAA 33 (L) 06/20/2022 0958   GFRNONAA 33 (L) 06/10/2020 1549   GFRAA 38 (L) 06/10/2020 1549       Component Value Date/Time   WBC 12.3 (H) 06/20/2022 0958   RBC 4.78 06/20/2022 0958   HGB 14.4 06/20/2022 0958   HCT 42.9 06/20/2022 0958   PLT 266 06/20/2022 0958   MCV 89.7 06/20/2022 0958   MCH 30.1 06/20/2022 0958   MCHC 33.6 06/20/2022 0958   RDW 12.3 06/20/2022 0958   LYMPHSABS 1.4 03/08/2022 1242   MONOABS 0.5 03/08/2022 1242   EOSABS 0.1 03/08/2022 1242   BASOSABS 0.1 03/08/2022 1242    No results found for: "POCLITH", "LITHIUM"   No results found for: "PHENYTOIN", "PHENOBARB", "VALPROATE", "CBMZ"   .res Assessment: Plan:   Pt seen for 30 minutes and time spent discussing recent health issues and the effect that this has had  on her mood and anxiety. Reviewed current psychiatric medications and response. Discussed that Amitriptyline is used to treat pain, depression, and insomnia. Discussed option to increase Amitriptyline to 25 mg at bedtime to possibly improve depression and insomnia since she reports that she has not experienced an improvement in pain at the 10 mg dose. Pt is in agreement with plan to increase Amitriptyline.  Discussed discontinuing Hydroxyzine with increase in Amitriptyline to attempt to reduce medication. Pt is in agreement with this plan and was advised to call office if she experiences any worsening in anxiety or insomnia.  Continue Wellbutrin XL 150 mg po qd for depression.  Continue Lamictal 200 mg po qd for depression and mood stabilization.  Continue Mirtazapine for depression and insomnia.  Pt to follow-up in 3 months or  sooner if clinically indicated.  Patient advised to contact office with any questions, adverse effects, or acute worsening in signs and symptoms.     Megan Brewer was seen today for anxiety, depression and insomnia.  Diagnoses and all orders for this visit:  Bipolar disorder, current episode mixed, moderate (HCC) -     buPROPion (WELLBUTRIN XL) 150 MG 24 hr tablet; TAKE ONE (1) TABLET BY MOUTH EACH DAY -     lamoTRIgine (LAMICTAL) 200 MG tablet; Take 1 tablet (200 mg total) by mouth at bedtime.  Other chronic pain -     amitriptyline (ELAVIL) 25 MG tablet; Take 1 tablet (25 mg total) by mouth at bedtime.  Insomnia due to other mental disorder -     mirtazapine (REMERON) 30 MG tablet; Take 1 tablet (30 mg total) by mouth at bedtime.  Anxiety disorder, unspecified type -     mirtazapine (REMERON) 30 MG tablet; Take 1 tablet (30 mg total) by mouth at bedtime.     Please see After Visit Summary for patient specific instructions.  Future Appointments  Date Time Provider [place]  07/17/2022  1:00 PM Shelda Pal, [place] [facility]  07/24/2022 11:00 AM GI-BCG DX DEXA 1 GI-BCGDG GI-BREAST CE  08/24/2022  2:30 PM Narda Amber K, DO LBN-LBNG None  08/27/2022 11:15 AM [name], Clide Deutscher, MD CPR-PRMA CPR  10/01/2022  2:00 PM CHINF-CHAIR 1 CH-INFWM None  10/14/2022 11:00 AM Thayer Headings, PMHNP CP-CP None  10/28/2022 10:20 AM Bo Merino, MD CR-GSO None  11/18/2022  2:00 PM Bo Merino, MD CR-GSO None    No orders of the defined types were placed in this encounter.   -------------------------------

## 2022-07-17 ENCOUNTER — Ambulatory Visit (INDEPENDENT_AMBULATORY_CARE_PROVIDER_SITE_OTHER): Payer: Medicare PPO | Admitting: Family Medicine

## 2022-07-17 ENCOUNTER — Encounter: Payer: Self-pay | Admitting: Family Medicine

## 2022-07-17 VITALS — BP 128/72 | HR 79 | Temp 98.3°F

## 2022-07-17 DIAGNOSIS — G2581 Restless legs syndrome: Secondary | ICD-10-CM

## 2022-07-17 DIAGNOSIS — R251 Tremor, unspecified: Secondary | ICD-10-CM | POA: Diagnosis not present

## 2022-07-17 DIAGNOSIS — G8929 Other chronic pain: Secondary | ICD-10-CM | POA: Diagnosis not present

## 2022-07-17 MED ORDER — PREGABALIN 25 MG PO CAPS: 25.0000 mg | ORAL_CAPSULE | Freq: Two times a day (BID) | ORAL | 1 refills | Status: DC

## 2022-07-17 MED ORDER — PRIMIDONE 50 MG PO TABS: 50.0000 mg | ORAL_TABLET | Freq: Every day | ORAL | 3 refills | Status: DC

## 2022-07-17 NOTE — Progress Notes (Addendum)
Chief Complaint  Patient presents with   Follow-up    Subjective: Patient is a 76 y.o. female here for follow-up.  Tramadol was increased from 50 mg twice daily to 100 mg twice daily.  She reports some improvement with this.  She still has tenderness in her neck and upper back region.  She had an infusion of Solu-Medrol which was helpful.  Patient is being seen by the neurology team for a tremor.  They have her on Sinemet.  She was trialed on Inderal which she reports compliance with but it has not helped.  She has never been on primidone before.  It mainly affects her legs.  This is distinct from her RLS symptoms.  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 No accessory muscle use Psych: Age appropriate judgment and insight, normal affect and mood  Assessment and Plan: Other chronic pain - Plan: pregabalin (LYRICA) 25 MG capsule  Tremor - Plan: primidone (MYSOLINE) 50 MG tablet  Restless legs syndrome - Plan: pregabalin (LYRICA) 25 MG capsule  Chronic, improving but not fully better.  Start Lyrica 25 mg twice  daily.  Continue tramadol 100 mg twice daily as needed.  She seems to be improved with the quarterly steroid injections. Chronic, unstable.  Stop Inderal, start primidone 50 mg daily.  Could increase to twice daily.  Follow-up in 1 month to recheck this.  She has an appointment with the neurology team at the end of January. The patient voiced understanding and agreement to the plan.  [place], DO 07/17/22  4:35 PM

## 2022-07-17 NOTE — Patient Instructions (Signed)
OK to stop the ropinirole.   The Lyrica can make you drowsy.  Let us know if you need anything.

## 2022-07-19 ENCOUNTER — Emergency Department (HOSPITAL_COMMUNITY)
Admission: EM | Admit: 2022-07-19 | Discharge: 2022-07-19 | Disposition: A | Discharge status: Home / Self Care | Payer: Medicare PPO | Attending: Emergency Medicine | Admitting: Emergency Medicine

## 2022-07-19 ENCOUNTER — Emergency Department (HOSPITAL_COMMUNITY): Payer: Medicare PPO

## 2022-07-19 ENCOUNTER — Other Ambulatory Visit: Payer: Self-pay

## 2022-07-19 DIAGNOSIS — W19XXXA Unspecified fall, initial encounter: Secondary | ICD-10-CM | POA: Diagnosis not present

## 2022-07-19 DIAGNOSIS — M25562 Pain in left knee: Secondary | ICD-10-CM | POA: Insufficient documentation

## 2022-07-19 DIAGNOSIS — S80212A Abrasion, left knee, initial encounter: Secondary | ICD-10-CM | POA: Diagnosis not present

## 2022-07-19 DIAGNOSIS — M25519 Pain in unspecified shoulder: Secondary | ICD-10-CM | POA: Diagnosis not present

## 2022-07-19 DIAGNOSIS — S99922A Unspecified injury of left foot, initial encounter: Secondary | ICD-10-CM | POA: Diagnosis not present

## 2022-07-19 DIAGNOSIS — M79672 Pain in left foot: Secondary | ICD-10-CM | POA: Diagnosis not present

## 2022-07-19 DIAGNOSIS — S50812A Abrasion of left forearm, initial encounter: Secondary | ICD-10-CM

## 2022-07-19 DIAGNOSIS — R0602 Shortness of breath: Secondary | ICD-10-CM | POA: Diagnosis not present

## 2022-07-19 DIAGNOSIS — R251 Tremor, unspecified: Secondary | ICD-10-CM | POA: Insufficient documentation

## 2022-07-19 DIAGNOSIS — R000 Tachycardia, unspecified: Secondary | ICD-10-CM | POA: Diagnosis not present

## 2022-07-19 DIAGNOSIS — R0789 Other chest pain: Secondary | ICD-10-CM | POA: Diagnosis not present

## 2022-07-19 DIAGNOSIS — S8992XA Unspecified injury of left lower leg, initial encounter: Secondary | ICD-10-CM | POA: Diagnosis not present

## 2022-07-19 DIAGNOSIS — R079 Chest pain, unspecified: Secondary | ICD-10-CM | POA: Diagnosis not present

## 2022-07-19 NOTE — ED Provider Notes (Signed)
[facility] DEPT Provider Note   CSN: [id number] Arrival date & time: 07/19/22  5364     History  Chief Complaint  Patient presents with   Fall   Shaking    [name] is a 76 y.o. female.  76 year old female presents after falling yesterday.  Patient states that she has a history of a treatment which she has had for several years.  Saw her primary care doctor for this on Friday placed on new medication.  States that she feels better inside but that her tremor seems to be worse.  Denies any fever or chills.  No cough congestion.  States that she lost her balance and fell onto her left side.  Denies any head or neck trauma.  Complains of pain to her left foot as well as left knee.  Denies any hip or ankle pain.       Home Medications Prior to Admission medications   Medication Sig Start Date End Date Taking? Authorizing Provider  acetaminophen (TYLENOL) 500 MG tablet Take 500 mg by mouth every 6 (six) hours as needed for moderate pain.    [provider]  amitriptyline (ELAVIL) 25 MG tablet Take 1 tablet (25 mg total) by mouth at bedtime. 07/14/22   Thayer Headings, PMHNP  azelastine (ASTELIN) 0.1 % nasal spray Place 2 sprays into both nostrils 2 (two) times daily. 03/10/21   Dara Hoyer, FNP  baclofen (LIORESAL) 10 MG tablet Take 10 mg by mouth daily as needed for muscle spasms. 06/03/20   [provider]  buPROPion (WELLBUTRIN XL) 150 MG 24 hr tablet TAKE ONE (1) TABLET BY MOUTH EACH DAY 07/14/22   Thayer Headings, PMHNP  CALCIUM PO Take 1 tablet by mouth in the morning and at bedtime.    [provider]  carbidopa-levodopa (SINEMET IR) 25-100 MG tablet TAKE AS NEEDED FOR ESVERE RESTLESS LEG SYNDROME 04/13/22   Narda Amber K, DO  cholecalciferol (VITAMIN D3) 25 MCG (1000 UNIT) tablet Take 1,000 Units by mouth daily.    [provider]  COVID-19 mRNA vaccine [phone] (COMIRNATY) syringe Inject into the  muscle. 06/03/22   Carlyle Basques, MD  cycloSPORINE (RESTASIS) 0.05 % ophthalmic emulsion Place 1 drop into both eyes in the morning, at noon, in the evening, and at bedtime.    [provider]  dexlansoprazole (DEXILANT) 60 MG capsule Take 1 capsule (60 mg total) by mouth daily with breakfast. 04/30/22   Esterwood, Amy S, PA-C  famotidine (PEPCID) 20 MG tablet Take 1 tablet (20 mg total) by mouth at bedtime. 04/30/22   Esterwood, Amy S, PA-C  ferrous sulfate 325 (65 FE) MG tablet Take 650 mg by mouth daily with breakfast.    [provider]  GAMMAGARD 20 GM/200ML SOLN Inject into the vein. Every 21 days 02/27/22   [provider]  heparin lock flush 100 UNIT/ML SOLN injection Inject 500 Units into the vein every 21 ( twenty-one) days. 03/31/21   [provider]  ibuprofen (ADVIL) 200 MG tablet Take 600 mg by mouth as needed for mild pain.    [provider]  immune globulin, human, (GAMMAGARD S/D LESS IGA) 10 g injection Inject 2 g/kg into the vein every 21 ( twenty-one) days.    [provider]  influenza vaccine adjuvanted (FLUAD QUADRIVALENT) 0.5 ML injection Inject into the muscle. 06/03/22   Carlyle Basques, MD  lamoTRIgine (LAMICTAL) 200 MG tablet Take 1 tablet (200 mg total) by mouth at bedtime.  07/14/22 01/10/23  Thayer Headings, PMHNP  levothyroxine (SYNTHROID) 75 MCG tablet TAKE ONE (1) TABLET BY MOUTH EACH DAY 30MINUTES BEFORE BREAKFAST ON EMPTY STOMACH 03/12/22   [name], NP  magnesium oxide (MAG-OX) 400 MG tablet Take 400 mg by mouth daily.     [provider]  melatonin 5 MG TABS Take 5 mg by mouth.    [provider]  mirtazapine (REMERON) 30 MG tablet Take 1 tablet (30 mg total) by mouth at bedtime. 07/14/22 01/10/23  Thayer Headings, PMHNP  mupirocin ointment (BACTROBAN) 2 % Apply 1 application. topically 2 (two) times daily. 12/29/21   [name], NP  ondansetron (ZOFRAN) 4 MG tablet Take 1 tablet  (4 mg total) by mouth every 8 (eight) hours as needed for nausea or vomiting. 04/01/22   Wardell Honour, MD  potassium chloride (KLOR-CON) 10 MEQ tablet Take 1 tablet (10 mEq total) by mouth 4 (four) times daily. 05/04/22   Shelda Pal, DO  pregabalin (LYRICA) 25 MG capsule Take 1 capsule (25 mg total) by mouth 2 (two) times daily. 07/17/22   Shelda Pal, DO  primidone (MYSOLINE) 50 MG tablet Take 1 tablet (50 mg total) by mouth daily. 07/17/22   Shelda Pal, DO  RSV vaccine recomb adjuvanted (AREXVY) 120 MCG/0.5ML injection Inject into the muscle. 06/11/22   Carlyle Basques, MD  sodium chloride, PF, 0.9 % injection Inject 5-10 mLs into the vein as needed (Immunoglobulin). 02/12/21   [provider]  sucralfate (CARAFATE) 1 GM/10ML suspension Take 10 mLs (1 g total) by mouth 4 (four) times daily as needed. 07/13/22   Shelda Pal, DO  tamsulosin (FLOMAX) 0.4 MG CAPS capsule Take 1 capsule (0.4 mg total) by mouth daily. 01/23/21   Shelly Coss, MD  torsemide (DEMADEX) 20 MG tablet Take 20 mg by mouth daily.    [provider]  traMADol (ULTRAM) 50 MG tablet Take 2 tablets (100 mg total) by mouth every 12 (twelve) hours as needed (Pain). 06/23/22   Shelda Pal, DO  triamcinolone cream (KENALOG) 0.1 % Apply 1 application topically 2 (two) times daily as needed (rash).    [provider]  TURMERIC PO Take by mouth.    [provider]  UNABLE TO FIND Take 1 tablet by mouth daily. Med Name: [name] 1 by mouth daily for UTI prevention    [provider]  vitamin B-12 (CYANOCOBALAMIN) 1000 MCG tablet TAKE 1 TABLET BY MOUTH DAILY 02/14/21   [provider]  ZINC OXIDE, TOPICAL, 10 % CREA Apply 1 application topically 2 (two) times daily as needed (rash). 02/14/21   Aline August, MD      Allergies    Imitrex [sumatriptan], Tetracycline, Corticosteroids, Cefixime, Ciprofloxacin, Doxycycline, Gabapentin,  Isometheptene-dichloral-apap, Ketorolac, Prednisone, Promethazine, Stadol [butorphanol], Tape, Erythromycin base, and Propoxyphene    Review of Systems   Review of Systems  All other systems reviewed and are negative.   Physical Exam Updated Vital Signs BP 116/60   Pulse 95   Temp 99 F (37.2 C) (Oral)   Resp 18   SpO2 98%  Physical Exam Vitals and nursing note reviewed.  Constitutional:      General: She is not in acute distress.    Appearance: Normal appearance. She is well-developed. She is not toxic-appearing.  HENT:     Head: Normocephalic and atraumatic.  Eyes:     General: Lids are normal.     Conjunctiva/sclera: Conjunctivae normal.  Pupils: Pupils are equal, round, and reactive to light.  Neck:     Thyroid: No thyroid mass.     Trachea: No tracheal deviation.  Cardiovascular:     Rate and Rhythm: Normal rate and regular rhythm.     Heart sounds: Normal heart sounds. No murmur heard.    No gallop.  Pulmonary:     Effort: Pulmonary effort is normal. No respiratory distress.     Breath sounds: Normal breath sounds. No stridor. No decreased breath sounds, wheezing, rhonchi or rales.  Abdominal:     General: There is no distension.     Palpations: Abdomen is soft.     Tenderness: There is no abdominal tenderness. There is no rebound.  Musculoskeletal:     Cervical back: Normal range of motion and neck supple.     Left knee: Bony tenderness present. Decreased range of motion. Tenderness present.       Feet:  Skin:    General: Skin is warm and dry.     Findings: No abrasion or rash.  Neurological:     Mental Status: She is alert and oriented to person, place, and time. Mental status is at baseline.     GCS: GCS eye subscore is 4. GCS verbal subscore is 5. GCS motor subscore is 6.     Cranial Nerves: No cranial nerve deficit.     Sensory: No sensory deficit.     Motor: Tremor present.  Psychiatric:        Attention and Perception: Attention normal.         Speech: Speech normal.        Behavior: Behavior normal.     ED Results / Procedures / Treatments   Labs (all labs ordered are listed, but only abnormal results are displayed) Labs Reviewed - No data to display  EKG None  Radiology No results found.  Procedures Procedures    Medications Ordered in ED Medications - No data to display  ED Course/ Medical Decision Making/ A&P                           Medical Decision Making Amount and/or Complexity of Data Reviewed Radiology: ordered.   Patient is x-rays my interpretation showed no acute fracture of her knee or her left foot.  Patient's tremors have been going on for very long time.  Do not think the patient is septic.  Will be discharged home and encouraged her to follow-up with her doctor        Final Clinical Impression(s) / ED Diagnoses Final diagnoses:  None    Rx / DC Orders ED Discharge Orders     None         Lacretia Leigh, MD 07/19/22 [phone]

## 2022-07-19 NOTE — ED Triage Notes (Addendum)
Patient BIB EMS for evaluation of L side pain s/p fall and increased shaking.  Pt has recent medication changes due to shaking and restarted Lyrica. Yesterday she fell onto L sided.  No LOC.  C/o pain to L side, L shoulder, L knee, and L 4th toe.  Was able to get up and ambulate.  This morning pain and shaking were worse.

## 2022-07-21 ENCOUNTER — Ambulatory Visit: Payer: Medicare PPO | Admitting: Family Medicine

## 2022-07-21 ENCOUNTER — Encounter: Payer: Self-pay | Admitting: Family Medicine

## 2022-07-21 VITALS — BP 118/72 | HR 62 | Temp 97.7°F

## 2022-07-21 DIAGNOSIS — M25562 Pain in left knee: Secondary | ICD-10-CM

## 2022-07-21 DIAGNOSIS — S51812A Laceration without foreign body of left forearm, initial encounter: Secondary | ICD-10-CM | POA: Diagnosis not present

## 2022-07-21 DIAGNOSIS — R251 Tremor, unspecified: Secondary | ICD-10-CM | POA: Diagnosis not present

## 2022-07-21 MED ORDER — METHYLPREDNISOLONE ACETATE 80 MG/ML IJ SUSP
80.0000 mg | Freq: Once | INTRAMUSCULAR | Status: AC
  Administered 2022-07-21: 80 mg via INTRAMUSCULAR

## 2022-07-21 NOTE — Patient Instructions (Signed)
Ice/cold pack over area for 10-15 min twice daily.  OK to take Tylenol 1000 mg (2 extra strength tabs) or 975 mg (3 regular strength tabs) every 6 hours as needed.  When you do wash it, use only soap and water. Do not vigorously scrub. Apply triple antibiotic ointment (like Neosporin) twice daily. Keep the area clean and dry.   Things to look out for: increasing pain not relieved by acetaminophen, fevers, spreading redness, drainage of pus, or foul odor.  Let us know if you need anything.

## 2022-07-21 NOTE — Progress Notes (Signed)
Chief Complaint  Patient presents with   Fall    Subjective: Patient is a 76 y.o. female here for ER follow-up.  She is here with her spouse.  On 07/19/2022, she actually felt pretty good.  She had recently started primidone and Lyrica.  She got up slowly and then as she was walking, she felt her entire body jolt/spasm.  She fell down tearing skin on her left forearm and falling on her knee.  She went to the emergency department where no fractures/dislocations were found.  She has tried taking tramadol with short-term relief.  She has also been using Salonpas with limited relief.  Her tremor is unchanged.  She never had any dizziness or lightheadedness.  She is taking the Lyrica 25 mg twice daily but not currently the primidone.  She has reached out to her neurologist but has an appointment next month currently.  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[place])    Lung cancer ([place]) 2011, 2014   Memory loss    Migraine    Opioid abuse ([place])    Osteoporosis    Presence of neurostimulator    Restless legs    Sleep apnea     Objective: BP 118/72 (BP Location: Left Arm, Patient Position: Sitting, Cuff Size: Normal)   Pulse 62   Temp 97.7 F (36.5 C) (Oral)   SpO2 93%  General: Awake, appears stated age MSK: Extremely tender to palpation of the anterior medial knee on the left, no soft tissue swelling or effusion, no ecchymosis or erythema, no excessive warmth, normal active/passive range of motion, negative anterior/posterior drawer, patellar apprehension, varus/valgus stress, Stines on the left Skin: Superficial tear over the dorsum of the left forearm.  No surrounding  erythema, fluctuance, excessive redness, drainage. Lungs: No accessory muscle use Neuro: Resting tremor noted of the upper extremities and lower extremities. Psych: Age appropriate judgment and insight, normal affect and mood  Assessment and Plan: Acute pain of left knee - Plan: methylPREDNISolone acetate (DEPO-MEDROL) injection 80 mg  Tremor  Skin tear of left forearm without complication, initial encounter  Depo injection IM 80 mg today.  Continue tramadol, ice, topical lidocaine.  Refer to sports medicine in the next couple days if no better. Restart the primidone 50 mg nightly unless she is able to get in with her neurologist soon. Topical antibiotic, keep clean and dry, warning signs and symptoms verbalized and written down. The patient and her spouse voiced understanding and agreement to the plan.  I spent 32 minutes with the patient/spouse discussing the above plans in addition to reviewing her chart and particularly her ER visit on the same date of visit.  [place], DO 07/21/22  11:59 AM

## 2022-07-23 ENCOUNTER — Encounter: Payer: Self-pay | Admitting: Family Medicine

## 2022-07-24 ENCOUNTER — Ambulatory Visit
Admission: RE | Admit: 2022-07-24 | Discharge: 2022-07-24 | Disposition: A | Discharge status: Home / Self Care | Payer: Medicare PPO | Source: Ambulatory Visit | Attending: Nurse Practitioner | Admitting: Nurse Practitioner

## 2022-07-24 DIAGNOSIS — Z780 Asymptomatic menopausal state: Secondary | ICD-10-CM | POA: Diagnosis not present

## 2022-07-24 DIAGNOSIS — M8588 Other specified disorders of bone density and structure, other site: Secondary | ICD-10-CM | POA: Diagnosis not present

## 2022-07-24 DIAGNOSIS — E2839 Other primary ovarian failure: Secondary | ICD-10-CM

## 2022-07-24 DIAGNOSIS — M810 Age-related osteoporosis without current pathological fracture: Secondary | ICD-10-CM | POA: Diagnosis not present

## 2022-07-31 NOTE — Telephone Encounter (Signed)
For clarification- is this okay to schedule?

## 2022-08-03 NOTE — Telephone Encounter (Signed)
My Chart message sent

## 2022-08-06 ENCOUNTER — Telehealth: Payer: Self-pay

## 2022-08-06 ENCOUNTER — Ambulatory Visit (INDEPENDENT_AMBULATORY_CARE_PROVIDER_SITE_OTHER): Payer: Medicare PPO

## 2022-08-06 DIAGNOSIS — M810 Age-related osteoporosis without current pathological fracture: Secondary | ICD-10-CM

## 2022-08-06 MED ORDER — DENOSUMAB 60 MG/ML ~~LOC~~ SOSY
60.0000 mg | PREFILLED_SYRINGE | Freq: Once | SUBCUTANEOUS | Status: AC
  Administered 2022-08-06: 60 mg via SUBCUTANEOUS

## 2022-08-06 NOTE — Progress Notes (Signed)
Megan Brewer is a 76 y.o. female presents to the office today for Prolia injection, per physician's orders. Prolia 60 mg SQ was administered R posterior arm today. Patient tolerated injection. Patient due for follow up labs/provider appt: No.  Patient next injection due: 6 months, appt made No- will schedule in 5 months  [place], Kristine Garbe L

## 2022-08-06 NOTE — Telephone Encounter (Signed)
Pt received Prolia today. Next injection due: 02/05/23.

## 2022-08-24 ENCOUNTER — Ambulatory Visit: Payer: Medicare PPO | Admitting: Neurology

## 2022-08-24 ENCOUNTER — Encounter: Payer: Self-pay | Admitting: Neurology

## 2022-08-24 VITALS — BP 104/66 | HR 114 | Ht 64.0 in | Wt 140.0 lb

## 2022-08-24 DIAGNOSIS — G2581 Restless legs syndrome: Secondary | ICD-10-CM

## 2022-08-24 DIAGNOSIS — G250 Essential tremor: Secondary | ICD-10-CM

## 2022-08-24 DIAGNOSIS — R2681 Unsteadiness on feet: Secondary | ICD-10-CM | POA: Diagnosis not present

## 2022-08-24 DIAGNOSIS — G259 Extrapyramidal and movement disorder, unspecified: Secondary | ICD-10-CM | POA: Diagnosis not present

## 2022-08-24 NOTE — Patient Instructions (Signed)
We will refer home physical therapy for gait training  Please follow-up with your counselor   Return to clinic in 9 months

## 2022-08-24 NOTE — Progress Notes (Signed)
Follow-up Visit   Date: 08/24/22   Megan Brewer MRN: [id number] DOB: 01-Jul-1946   Interim History: Megan Brewer is a 77 y.o. 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 The patient was accompanied to the clinic by self.  IMPRESSION/PLAN: Functional movement disorder.  Movements are distractible during the exam and there are periods of the visit when she has no movements.  She does have some mild degree of essential tremor, but this would not manifest with such irregular trunk and leg movements.  I discussed that stress/anxiety/depression can manifest with such movements as which time she became tearful telling me that she has depression for 40 + years and has seen a number of specialists. I recommend that she continue to work with her counselor.  I will also send a referral for home PT to work on gait training.   Restless leg syndrome, stable  - Previously on ropinirole and sinemet - OK to continue Lyrica 25mg  BID  Benign tremors of the hands  No exam findings to suggest parkinson's disease  - OK to continue primidone 50mg  TID   Return to clinic in 9 months  ------------------------------------------------------------------------  UPDATE 08/24/2022:  She is here for 9 month follow-up visit. Since the summer, she has developed abnormal movements of the hands, arms, trunk, and legs as well as generalized pain.  She was diagnosed with polymyalgia rheumatica in August and started on solumedrol infusion.  The infusions have helped her pain, but no change in her movements.  She reports jerking movement of the arms and legs can be present at rest and with activity.  She is unable to control it.  She  is able to continue to do ADLs such as bathing, dressing and feeding.  She uses a wheelchair at home.  Her ropinirole and sinemet was switched to Lyrica for RLS by PCP.  She continues to have a lot of depression and is seeing a Social worker.   Medications:  Current Outpatient Medications on File Prior to Visit  Medication Sig Dispense Refill   acetaminophen (TYLENOL) 500 MG tablet Take 500 mg by mouth every 6 (six) hours as needed for moderate pain.     azelastine (ASTELIN) 0.1 % nasal spray Place 2 sprays into both nostrils 2 (two) times daily. 30 mL 5   baclofen (LIORESAL) 10 MG tablet Take 10 mg by mouth daily as needed for muscle spasms.     buPROPion (WELLBUTRIN XL) 150 MG 24 hr tablet TAKE ONE (1) TABLET BY MOUTH EACH DAY 90 tablet 1   CALCIUM PO Take 1 tablet by mouth in the morning and at bedtime.     cholecalciferol (VITAMIN D3) 25 MCG (1000 UNIT) tablet Take 1,000 Units by mouth daily.     COVID-19 mRNA vaccine 2023-2024 (COMIRNATY) syringe Inject into the muscle. 0.3 mL 0   cycloSPORINE (RESTASIS) 0.05 % ophthalmic emulsion Place 1 drop into both eyes in the morning, at noon, in the evening, and at bedtime.     dexlansoprazole (DEXILANT) 60 MG capsule Take 1 capsule (60 mg total) by mouth daily with breakfast. 30 capsule 11   famotidine (PEPCID) 20 MG tablet Take 1 tablet (20 mg total) by mouth at bedtime. 30 tablet 11   ferrous sulfate 325 (65 FE)  MG tablet Take 650 mg by mouth daily with breakfast.     GAMMAGARD 20 GM/200ML SOLN Inject into the vein. Every 21 days     heparin lock flush 100 UNIT/ML SOLN injection Inject 500 Units into the vein every 21 ( twenty-one) days.     ibuprofen (ADVIL) 200 MG tablet Take 600 mg by mouth as needed for mild pain.     immune globulin, human, (GAMMAGARD S/D LESS IGA) 10 g injection Inject 2 g/kg into the vein every 21 ( twenty-one) days.     influenza vaccine adjuvanted (FLUAD QUADRIVALENT) 0.5 ML injection Inject into the muscle. 0.5 mL 0    lamoTRIgine (LAMICTAL) 200 MG tablet Take 1 tablet (200 mg total) by mouth at bedtime. 90 tablet 1   levothyroxine (SYNTHROID) 75 MCG tablet TAKE ONE (1) TABLET BY MOUTH EACH DAY 30MINUTES BEFORE BREAKFAST ON EMPTY STOMACH 90 tablet 1   magnesium oxide (MAG-OX) 400 MG tablet Take 400 mg by mouth daily.      melatonin 5 MG TABS Take 5 mg by mouth.     mirtazapine (REMERON) 30 MG tablet Take 1 tablet (30 mg total) by mouth at bedtime. 90 tablet 1   mupirocin ointment (BACTROBAN) 2 % Apply 1 application. topically 2 (two) times daily. 22 g 0   potassium chloride (KLOR-CON) 10 MEQ tablet Take 1 tablet (10 mEq total) by mouth 4 (four) times daily. 360 tablet 1   pregabalin (LYRICA) 25 MG capsule Take 1 capsule (25 mg total) by mouth 2 (two) times daily. 60 capsule 1   primidone (MYSOLINE) 50 MG tablet Take 1 tablet (50 mg total) by mouth daily. 30 tablet 3   RSV vaccine recomb adjuvanted (AREXVY) 120 MCG/0.5ML injection Inject into the muscle. 1 each 0   sodium chloride, PF, 0.9 % injection Inject 5-10 mLs into the vein as needed (Immunoglobulin).     sucralfate (CARAFATE) 1 GM/10ML suspension Take 10 mLs (1 g total) by mouth 4 (four) times daily as needed. 420 mL 3   tamsulosin (FLOMAX) 0.4 MG CAPS capsule Take 1 capsule (0.4 mg total) by mouth daily. 30 capsule 1   torsemide (DEMADEX) 20 MG tablet Take 20 mg by mouth daily.     traMADol (ULTRAM) 50 MG tablet Take 2 tablets (100 mg total) by mouth every 12 (twelve) hours as needed (Pain). 120 tablet 2   triamcinolone cream (KENALOG) 0.1 % Apply 1 application topically 2 (two) times daily as needed (rash).     UNABLE TO FIND Take 1 tablet by mouth daily. Med Name: [name] 1 by mouth daily for UTI prevention     vitamin B-12 (CYANOCOBALAMIN) 1000 MCG tablet TAKE 1 TABLET BY MOUTH DAILY     ZINC OXIDE, TOPICAL, 10 % CREA Apply 1 application topically 2 (two) times daily as needed (rash).     amitriptyline (ELAVIL) 25 MG tablet Take 1 tablet (25 mg total)  by mouth at bedtime. (Patient not taking: Reported on 08/24/2022) 30 tablet 2   carbidopa-levodopa (SINEMET IR) 25-100 MG tablet TAKE AS NEEDED FOR ESVERE RESTLESS LEG SYNDROME (Patient not taking: Reported on 08/24/2022) 30 tablet 0   ondansetron (ZOFRAN) 4 MG tablet Take 1 tablet (4 mg total) by mouth every 8 (eight) hours as needed for nausea or vomiting. (Patient not taking: Reported on 08/24/2022) 20 tablet 0   TURMERIC PO Take by mouth. (Patient not taking: Reported on 08/24/2022)     No current facility-administered medications on file prior to visit.  Allergies:  Allergies  Allergen Reactions   Imitrex [Sumatriptan] Nausea And Vomiting    Ringing in the ears, migraines are worse   Tetracycline Nausea Only    Causes ringing in ears, dizziness, migraine, nausea   Corticosteroids Other (See Comments)    12/02/2016 interview by [facility]K: Oral dosing causes migraines/nausea/tinnitus. Prev tolerated IV and intranasal admin w/o difficulty.   Cefixime Other (See Comments)    Headache    Ciprofloxacin Other (See Comments)    Headache, dizziness, ringing in the ears   Doxycycline Other (See Comments)    unknown   Gabapentin     Throat swells/closes   Isometheptene-Dichloral-Apap Other (See Comments)    unknown   Ketorolac     12/02/2016 interview by CMK: Oral dosing prednisone/corticosteroids causes migraines/nausea/tinnitus. Prev tolerated IV and intranasal admin w/o difficulty.   Prednisone Other (See Comments)    Migraine, dizzy, ringing in ears-can take it IV 12/02/2016 interview by [name]: Oral prednisone dosing causes migraines/nausea/tinnitus. Prev tolerated IV and intranasal admin w/o difficulty.   Promethazine Other (See Comments)    causes severe abdominal pain when taken IV; however she can take PO or IM   Stadol [Butorphanol]     Cerebral pain   Tape Other (See Comments)    Adhesive tapes-patient denies   Erythromycin Base Diarrhea and Itching    Severe diarrhea   Propoxyphene  Nausea Only    Vital Signs:  BP 104/66   Pulse (!) 114   Ht 5\' 4"  (1.626 m)   Wt 140 lb (63.5 kg)   SpO2 98%   BMI 24.03 kg/m   Neurological Exam: MENTAL STATUS including orientation to time, place, person, recent and remote memory, attention span and concentration, language, and fund of knowledge is normal.  Speech is not dysarthric. She is crying during parts of the visit.   CRANIAL NERVES:  No visual field defects.  Pupils equal round and reactive to light.  Normal conjugate, extra-ocular eye movements in all directions of gaze.  No ptosis.  She tends to clench her teeth when talking.   MOTOR:  There is intermittent give-way weakness during motor testing, but all muscle groups are antigravity and able to resist.  Generalized irregular jerking of the hand, truck, and leg movements are seen with side-to-side movements of the legs, intermittent kicking. Movements do not represent dyskinesia or chorea or tremor.   These movements are distractible.  Tone is normal.  There is a mild intention tremor of the hands.    MSRs:  Reflexes are 2+/4 throughout, except 3+/4 at the patella bilaterally  SENSORY:  Intact to vibration throughout.  COORDINATION/GAIT:  Normal finger-to- nose-finger.  Intact rapid alternating movements bilaterally.  Gait not tested, patient arrived in wheelchair.   Data: Lab Results  Component Value Date   FERRITIN 42 02/06/2021    Thank you for allowing me to participate in patient's care.  If I can answer any additional questions, I would be pleased to do so.    Sincerely,    Spyridon Hornstein K. Posey Pronto, DO

## 2022-08-25 ENCOUNTER — Ambulatory Visit (INDEPENDENT_AMBULATORY_CARE_PROVIDER_SITE_OTHER): Payer: Medicare PPO | Admitting: Family Medicine

## 2022-08-25 ENCOUNTER — Encounter: Payer: Self-pay | Admitting: Family Medicine

## 2022-08-25 VITALS — BP 120/80 | HR 92 | Temp 97.3°F | Ht 64.5 in | Wt 140.0 lb

## 2022-08-25 DIAGNOSIS — F3162 Bipolar disorder, current episode mixed, moderate: Secondary | ICD-10-CM | POA: Diagnosis not present

## 2022-08-25 DIAGNOSIS — G8929 Other chronic pain: Secondary | ICD-10-CM | POA: Diagnosis not present

## 2022-08-25 DIAGNOSIS — R531 Weakness: Secondary | ICD-10-CM

## 2022-08-25 DIAGNOSIS — R251 Tremor, unspecified: Secondary | ICD-10-CM | POA: Diagnosis not present

## 2022-08-25 DIAGNOSIS — G2581 Restless legs syndrome: Secondary | ICD-10-CM

## 2022-08-25 MED ORDER — PRIMIDONE 50 MG PO TABS: 50.0000 mg | ORAL_TABLET | Freq: Two times a day (BID) | ORAL | 3 refills | Status: DC

## 2022-08-25 MED ORDER — PREGABALIN 50 MG PO CAPS: 50.0000 mg | ORAL_CAPSULE | Freq: Three times a day (TID) | ORAL | 5 refills | Status: DC

## 2022-08-25 NOTE — Patient Instructions (Addendum)
If you do not hear anything about your referral in the next 1-2 weeks, call our office and ask for an update.  Take 1 tab of the primidone twice daily for the next several weeks and let me know how you do.   [facility] [address] [place], [place] [address] [phone]  [facility] [address] [place], [place] [address] [phone]  [facility] [address] [place], [place] [address] [phone]  [facility] [address], [place], [place], #[phone] [address], [place], [place], [place]  [facility] [place], [place]  [place] [facility] [place], [place], [place]  [facility] [place], [place]  [facility] [place] [address] [place], [place].   Please go online to complete a form to submit first.  [facility]  [address] [place], [place].     [place] -  local practices located at: [address] [place], [place]. [phone]. [place], [place], [place]. [address], [place], [place], [place].  [phone].  Contact one of these offices sooner than later as it can take 2-3 months to get a new patient appointment.

## 2022-08-25 NOTE — Progress Notes (Signed)
Chief Complaint  Patient presents with   Follow-up    Subjective: Patient is a 77 y.o. female here for f/u.  She is here with her husband.  Inderal was stopped for her tremor and she was changed to primidone 50 mg qd. She reports no change. She had an appt w her neurologist and it was thought to be related to uncontrolled anxiety/depression. She does follow w psych.  She is requesting to see a different 1.  She currently sees [facility].  RLS-patient was having breakthrough symptoms at night taking 25 mg of Lyrica twice daily.  She started taking 2 tabs nightly and her symptoms are controlled.  She is requesting a higher strength that she is running out.  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[place])    Lung cancer ([place]) 2011, 2014   Memory loss    Migraine    Opioid abuse (HCC)    Osteoporosis    PMR (polymyalgia rheumatica) ([place]) 03/17/2022   Presence of neurostimulator    Restless legs    Sleep apnea     Objective: BP 120/80 (BP Location: Left Arm, Patient Position: Sitting, Cuff Size: Normal)   Pulse 92   Temp (!) 97.3 F (36.3 C) (Oral)   Ht 5' 4.5" (1.638 m)   Wt 140 lb (63.5 kg)   SpO2 91%   BMI 23.66 kg/m  Lungs: No accessory muscle use Neuro: Resting tremor appreciated Psych: Age appropriate judgment and insight, normal affect and mood  Assessment and Plan: Tremor - Plan: primidone (MYSOLINE) 50 MG tablet  Bipolar mixed affective disorder, moderate ([place]) - Plan: Ambulatory referral to Psychiatry  Weakness - Plan: Ambulatory referral to Home Health  Restless legs syndrome  Other chronic pain  Chronic, uncontrolled.  She  will start taking primidone 50 mg twice daily.  If this does not help or if it does help, she will send a message in a couple weeks. Neurology thinks this is potentially related to #1.  Refer to psychiatry, hopefully to [facility]. Refer to home health physical therapy. Chronic, controlled.  We will send in the higher dosage of Lyrica 50 mg twice daily. F/u in 6 mo for CPE.  The patient and her spouse voiced understanding and agreement to the plan.  [place], DO 08/25/22  3:28 PM

## 2022-08-27 ENCOUNTER — Telehealth: Payer: Self-pay | Admitting: Family Medicine

## 2022-08-27 ENCOUNTER — Encounter: Payer: Self-pay | Admitting: Physical Medicine and Rehabilitation

## 2022-08-27 ENCOUNTER — Encounter: Payer: Medicare PPO | Attending: Physical Medicine and Rehabilitation | Admitting: Physical Medicine and Rehabilitation

## 2022-08-27 ENCOUNTER — Other Ambulatory Visit: Payer: Self-pay | Admitting: Family Medicine

## 2022-08-27 VITALS — BP 102/67 | HR 97 | Ht 64.0 in

## 2022-08-27 DIAGNOSIS — R0600 Dyspnea, unspecified: Secondary | ICD-10-CM | POA: Diagnosis not present

## 2022-08-27 DIAGNOSIS — M353 Polymyalgia rheumatica: Secondary | ICD-10-CM | POA: Insufficient documentation

## 2022-08-27 MED ORDER — TORSEMIDE 20 MG PO TABS: 20.0000 mg | ORAL_TABLET | Freq: Every day | ORAL | 3 refills | Status: DC

## 2022-08-27 MED ORDER — VITAMIN D (ERGOCALCIFEROL) 1.25 MG (50000 UNIT) PO CAPS: 50000.0000 [IU] | ORAL_CAPSULE | ORAL | 0 refills | Status: DC

## 2022-08-27 NOTE — Telephone Encounter (Signed)
Sent in

## 2022-08-27 NOTE — Progress Notes (Signed)
Subjective:    Patient ID: Megan Brewer, female    DOB: 15-Sep-1945, 77 y.o.   MRN: [id number]  HPI 1) Diffuse pain, has been attributed to polymyalgia rheumatica Megan Brewer is a 77 year old woman who presents to establish care for diffuse pain.  -it is present in her wrists, clavicles, left hip -has been pretty good  -The Iv prednisone helped a great deal for some time.  -she has f/u with rheumatology -they have tried high doses of prednisone, low does hydrocodone, low doses Tramadol, lidocaine patches. Nothing has been helpful yet -she thinks the pain started after she spent 1 month at [facility] for sepsis and C diff. She left the hospital on June 1st. On June 4th she started with breathing difficulties, shaking, and some pain. It gradually got worse.  -her husband accompanies her today -steroids did not help leg pain  2) Shortness of breath -breathing without pain for the first time! -had been present July 2022 to July 2023 -she also feels a deflated rubber tire on her chest -she has been told her heart and lungs are fine -the steroids may have helped her to breath better -at the hospital where she was diagnosed she got a massive IV dose of steroids and this cleared up her lungs  Pain Inventory Average Pain 2 Pain Right Now 6 My pain is sharp, stabbing, and aching  In the last 24 hours, has pain interfered with the following? General activity 7 Relation with others 8 Enjoyment of life 10 What TIME of day is your pain at its worst? morning , daytime, evening, and night Sleep (in general) Fair  Pain is worse with: walking, bending, sitting, standing, and some activites Pain improves with: medication Relief from Meds: 5  walk with assistance how many minutes can you walk? 5-10 ability to climb steps?  no do you drive?  no use a wheelchair needs help with transfers  not employed: date last employed 2001 disabled: date disabled . I need assistance with the  following:  household duties and shopping  bladder control problems weakness tremor trouble walking spasms dizziness depression anxiety  New pt  New pt    Family History  Problem Relation Age of Onset   Hypertension Mother    GER disease Mother    Dementia Mother    Osteoporosis Mother    Arthritis Mother    Bipolar disorder Mother    Parkinson's disease Father    Congestive Heart Failure Father    Pneumonia Father    Arthritis Father    Dementia Father    Depression Father    Asthma Sister    Depression Sister    Bipolar disorder Brother    Asthma Daughter    Colon cancer Neg Hx    Esophageal cancer Neg Hx    Stomach cancer Neg Hx    Rectal cancer Neg Hx    Social History   Socioeconomic History   Marital status: Married    Spouse name: Not on file   Number of children: 2   Years of education: Not on file   Highest education level: GED or equivalent  Occupational History   Occupation: retired  Tobacco Use   Smoking status: Never   Smokeless tobacco: Never  Vaping Use   Vaping Use: Never used  Substance and Sexual Activity   Alcohol use: Not Currently   Drug use: Never   Sexual activity: Not on file  Other Topics Concern   Not  on file  Social History Narrative   Diet: None      Caffeine: coffee, tea, sodas less than or 1 daily.      Married, if yes what year: Yes, 1972      Do you live in a house, apartment, assisted living, condo, trailer, ect: Two story house       Pets: None      Current/Past profession: Teach      Exercise: None         Living Will: No   DNR: No   POA/HPOA: No      Functional Status:   Do you have difficulty bathing or dressing yourself? yes   Do you have difficulty preparing food or eating? yes   Do you have difficulty managing your medications? yes   Do you have difficulty managing your finances?yes   Do you have difficulty affording your medications? No      Right Handed    Lives in a two story home     Social Determinants of Health   Financial Resource Strain: Not on file  Food Insecurity: Not on file  Transportation Needs: Not on file  Physical Activity: Not on file  Stress: Not on file  Social Connections: Not on file   Past Surgical History:  Procedure Laterality Date   ABDOMINAL HYSTERECTOMY     CARPAL TUNNEL RELEASE     CATARACT EXTRACTION Bilateral 2019   CHOLECYSTECTOMY     COLONOSCOPY  2015   [facility]   CT LUNG SCREENING  2018   DENTAL SURGERY  06/17/2021   4 teeth rmoved   DG  BONE DENSITY ([place] HX)  2018   DIAGNOSTIC MAMMOGRAM  2019   GASTRIC BYPASS     LUNG CANCER SURGERY  02/2010   MULTIPLE TOOTH EXTRACTIONS     PORT A CATH REVISION Right 04/2021   PORT-A-CATH REMOVAL Left 02/07/2021   Procedure: REMOVAL PORT-A-CATH;  Surgeon: Mickeal Skinner, MD;  Location: [facility];  Service: General;  Laterality: Left;  60 MINUTES ROOM 4   SPINAL CORD STIMULATOR IMPLANT     Past Medical History:  Diagnosis Date   Anemia    Arachnoiditis    Bipolar disorder ([place])    Cataract    removed   Chronic post-thoracotomy pain    CKD (chronic kidney disease)    GERD (gastroesophageal reflux disease)    Hypogammaglobulinemia (HCC)    Hypotension    Hypothyroid    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HCC)    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  Opioid Risk Score:   Fall Risk Score:  `1  Depression screen [facility] 2/9     06/11/2022   10:09 AM 09/30/2021   11:02 AM 02/04/2021    3:28 PM 09/27/2020   10:57 AM 06/10/2020    2:51 PM 09/25/2019    1:06 PM 07/05/2019    1:18 PM  Depression screen PHQ 2/9  Decreased Interest 3 0 0 0 0 0 0  Down, Depressed, Hopeless 3 0 0 0 0 0 0  PHQ - 2 Score 6 0 0 0 0 0 0  Altered  sleeping 3  Tired, decreased energy 3        Change in appetite 2        Feeling bad or failure about yourself  3        Trouble concentrating 0        Moving slowly or fidgety/restless 3        Suicidal thoughts 3        PHQ-9 Score 23        Difficult doing work/chores Extremely dIfficult            Review of Systems  Respiratory:  Positive for shortness of breath.   Gastrointestinal:  Positive for constipation and nausea.  Musculoskeletal:  Positive for gait problem.       Spasms  Neurological:  Positive for dizziness, tremors and weakness.  Psychiatric/Behavioral:  Positive for dysphoric mood. The patient is nervous/anxious.   All other systems reviewed and are negative.     Objective:   Physical Exam Gen: no distress, normal appearing HEENT: oral mucosa pink and moist, NCAT Cardio: Reg rate Chest: normal effort, normal rate of breathing Abd: soft, non-distended Ext: no edema Psych: pleasant, normal affect Skin: intact Neuro: Alert and oriented x3. Slow speech    Assessment & Plan:   1) Chronic Pain Syndrome  with diffuse pain secondary to Polymylagia rheumatica -Discussed current symptoms of pain and history of pain.  -Discussed benefits of exercise in reducing pain. -discussed that the only thing that has been helpful was IV steroids. Continue -continue cymbalta 20mg  daily.  -prescribed ergocalciferol 50,000U  -will send message to her care team (Dr. Nani Ravens PCP, Dr. Percival Spanish cardiologist, Dr. Tana Coast rheumatologist) to discuss doing IV steroid treatment.  -will call her rheumatologist today to discuss Kevzara and Plaquenil.  -discussed her follow-up with rheumatology, who placed her on low dose prednisone 5mg  BID.  -Discussed following foods that may reduce pain: 1) Ginger (especially studied for arthritis)- reduce leukotriene production to decrease inflammation 2) Blueberries- high in phytonutrients that decrease inflammation 3) Salmon- marine  omega-3s reduce joint swelling and pain 4) Pumpkin seeds- reduce inflammation 5) dark chocolate- reduces inflammation 6) turmeric- reduces inflammation 7) tart cherries - reduce pain and stiffness 8) extra virgin olive oil - its compound olecanthal helps to block prostaglandins  9) chili peppers- can be eaten or applied topically via capsaicin 10) mint- helpful for headache, muscle aches, joint pain, and itching 11) garlic- reduces inflammation  Link to further information on diet for chronic pain: [email or web address]    2) Shortness of breath secondary to lung cancer -discussed that it started one year ago -discussed that cardio pulmonary workup has been normal -discussed that her IV steroids were very helpful.  -ordered pulmonary rehab

## 2022-08-27 NOTE — Telephone Encounter (Signed)
Prescription Request  08/27/2022  Is this a "Controlled Substance" medicine? No  LOV: 08/25/2022  What is the name of the medication or equipment?   torsemide (DEMADEX) 20 MG tablet [[id number]]   Have you contacted your pharmacy to request a refill? No   Which pharmacy would you like this sent to?  [facility] - [place], [place] - [address] [place] [address] Phone: [phone] Fax: [phone]    Patient notified that their request is being sent to the clinical staff for review and that they should receive a response within 2 business days.   Please advise at Mobile [phone] (mobile)

## 2022-09-01 ENCOUNTER — Encounter: Payer: Self-pay | Admitting: Family Medicine

## 2022-09-01 ENCOUNTER — Ambulatory Visit (INDEPENDENT_AMBULATORY_CARE_PROVIDER_SITE_OTHER): Payer: Medicare PPO | Admitting: Family Medicine

## 2022-09-01 ENCOUNTER — Ambulatory Visit: Payer: Medicare PPO | Admitting: Family Medicine

## 2022-09-01 VITALS — BP 132/80 | HR 112 | Temp 97.3°F | Ht 64.5 in | Wt 136.0 lb

## 2022-09-01 DIAGNOSIS — M25562 Pain in left knee: Secondary | ICD-10-CM

## 2022-09-01 MED ORDER — METHYLPREDNISOLONE ACETATE 40 MG/ML IJ SUSP
40.0000 mg | Freq: Once | INTRAMUSCULAR | Status: AC
  Administered 2022-09-01: 40 mg via INTRA_ARTICULAR

## 2022-09-01 MED ORDER — TORSEMIDE 20 MG PO TABS: 20.0000 mg | ORAL_TABLET | Freq: Two times a day (BID) | ORAL | 3 refills | Status: DC

## 2022-09-01 NOTE — Patient Instructions (Signed)
Ice/cold pack over area for 10-15 min twice daily.  OK to take Tylenol 1000 mg (2 extra strength tabs) or 975 mg (3 regular strength tabs) every 6 hours as needed.  Let us know if you need anything.  Knee Exercises It is normal to feel mild stretching, pulling, tightness, or discomfort as you do these exercises, but you should stop right away if you feel sudden pain or your pain gets worse.  STRETCHING AND RANGE OF MOTION EXERCISES  These exercises warm up your muscles and joints and improve the movement and flexibility of your knee. These exercises also help to relieve pain, numbness, and tingling. Exercise A: Knee Extension, Prone  Lie on your abdomen on a bed. Place your left / right knee just beyond the edge of the surface so your knee is not on the bed. You can put a towel under your left / right thigh just above your knee for comfort. Relax your leg muscles and allow gravity to straighten your knee. You should feel a stretch behind your left / right knee. Hold this position for 30 seconds. Scoot up so your knee is supported between repetitions. Repeat 2 times. Complete this stretch 3 times per week. Exercise B: Knee Flexion, Active     Lie on your back with both knees straight. If this causes back discomfort, bend your left / right knee so your foot is flat on the floor. Slowly slide your left / right heel back toward your buttocks until you feel a gentle stretch in the front of your knee or thigh. Hold this position for 30 seconds. Slowly slide your left / right heel back to the starting position. Repeat 2 times. Complete this exercise 3 times per week. Exercise C: Quadriceps, Prone     Lie on your abdomen on a firm surface, such as a bed or padded floor. Bend your left / right knee and hold your ankle. If you cannot reach your ankle or pant leg, loop a belt around your foot and grab the belt instead. Gently pull your heel toward your buttocks. Your knee should not slide out to  the side. You should feel a stretch in the front of your thigh and knee. Hold this position for 30 seconds. Repeat 2 times. Complete this stretch 3 times per week. Exercise D: Hamstring, Supine  Lie on your back. Loop a belt or towel over the ball of your left / right foot. The ball of your foot is on the walking surface, right under your toes. Straighten your left / right knee and slowly pull on the belt to raise your leg until you feel a gentle stretch behind your knee. Do not let your left / right knee bend while you do this. Keep your other leg flat on the floor. Hold this position for 30 seconds. Repeat 2 times. Complete this stretch 3 times per week. STRENGTHENING EXERCISES  These exercises build strength and endurance in your knee. Endurance is the ability to use your muscles for a long time, even after they get tired. Exercise E: Quadriceps, Isometric     Lie on your back with your left / right leg extended and your other knee bent. Put a rolled towel or small pillow under your knee if told by your health care provider. Slowly tense the muscles in the front of your left / right thigh. You should see your kneecap slide up toward your hip or see increased dimpling just above the knee. This motion will push  the back of the knee toward the floor. For 3 seconds, keep the muscle as tight as you can without increasing your pain. Relax the muscles slowly and completely. Repeat for 10 total reps Repeat 2 ti mes. Complete this exercise 3 times per week. Exercise F: Straight Leg Raises - Quadriceps  Lie on your back with your left / right leg extended and your other knee bent. Tense the muscles in the front of your left / right thigh. You should see your kneecap slide up or see increased dimpling just above the knee. Your thigh may even shake a bit. Keep these muscles tight as you raise your leg 4-6 inches (10-15 cm) off the floor. Do not let your knee bend. Hold this position for 3  seconds. Keep these muscles tense as you lower your leg. Relax your muscles slowly and completely after each repetition. 10 total reps. Repeat 2 times. Complete this exercise 3 times per week.  Exercise G: Hamstring Curls     If told by your health care provider, do this exercise while wearing ankle weights. Begin with 5 lb weights (optional). Then increase the weight by 1 lb (0.5 kg) increments. Do not wear ankle weights that are more than 20 lbs to start with. Lie on your abdomen with your legs straight. Bend your left / right knee as far as you can without feeling pain. Keep your hips flat against the floor. Hold this position for 3 seconds. Slowly lower your leg to the starting position. Repeat for 10 reps.  Repeat 2 times. Complete this exercise 3 times per week. Exercise H: Squats (Quadriceps)  Stand in front of a table, with your feet and knees pointing straight ahead. You may rest your hands on the table for balance but not for support. Slowly bend your knees and lower your hips like you are going to sit in a chair. Keep your weight over your heels, not over your toes. Keep your lower legs upright so they are parallel with the table legs. Do not let your hips go lower than your knees. Do not bend lower than told by your health care provider. If your knee pain increases, do not bend as low. Hold the squat position for 1 second. Slowly push with your legs to return to standing. Do not use your hands to pull yourself to standing. Repeat 2 times. Complete this exercise 3 times per week. Exercise I: Wall Slides (Quadriceps)     Lean your back against a smooth wall or door while you walk your feet out 18-24 inches (46-61 cm) from it. Place your feet hip-width apart. Slowly slide down the wall or door until your knees Repeat 2 times. Complete this exercise every other day. Exercise K: Straight Leg Raises - Hip Abductors  Lie on your side with your left / right leg in the top  position. Lie so your head, shoulder, knee, and hip line up. You may bend your bottom knee to help you keep your balance. Roll your hips slightly forward so your hips are stacked directly over each other and your left / right knee is facing forward. Leading with your heel, lift your top leg 4-6 inches (10-15 cm). You should feel the muscles in your outer hip lifting. Do not let your foot drift forward. Do not let your knee roll toward the ceiling. Hold this position for 3 seconds. Slowly return your leg to the starting position. Let your muscles relax completely after each repetition. 10 total reps.  Repeat 2 times. Complete this exercise 3 times per week. Exercise J: Straight Leg Raises - Hip Extensors  Lie on your abdomen on a firm surface. You can put a pillow under your hips if that is more comfortable. Tense the muscles in your buttocks and lift your left / right leg about 4-6 inches (10-15 cm). Keep your knee straight as you lift your leg. Hold this position for 3 seconds. Slowly lower your leg to the starting position. Let your leg relax completely after each repetition. Repeat 2 times. Complete this exercise 3 times per week. Document Released: 06/17/2005 Document Revised: 04/27/2016 Document Reviewed: 06/09/2015 Elsevier Interactive Patient Education  2017 [employer].

## 2022-09-01 NOTE — Progress Notes (Signed)
Musculoskeletal Exam  Patient: Megan Brewer DOB: Jun 01, 1946  DOS: 09/01/2022  SUBJECTIVE:  Chief Complaint:   Chief Complaint  Patient presents with   Knee Pain    Left knee     Megan Brewer is a 77 y.o.  female for evaluation and treatment of L knee pain.   Onset:  4 months ago. Started after a fall. Location: antero-medial knee on L Character:  aching and burning  Progression of issue:  has worsened over past 2 weeks without known cause Associated symptoms: swelling, radiation up thigh and down leg Comes and goes, will sometimes wake her up at night.  No redness, decreased ROM, fevers, catching/locking.  Bruise from getting in car this AM.  Treatment: to date has been OTC NSAIDS, acetaminophen, ice/heat, and tramadol.   Neurovascular symptoms: no  Past Medical History:  Diagnosis Date   Anemia    Arachnoiditis    Bipolar disorder (HCC)    Cataract    removed   Chronic post-thoracotomy pain    CKD (chronic kidney disease)    GERD (gastroesophageal reflux disease)    Hypogammaglobulinemia (HCC)    Hypotension    Hypothyroid    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HCC)    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HCC)    Lung cancer (HCC) 2011, 2014   Memory loss    Migraine    Opioid abuse (HCC)    Osteoporosis    PMR (polymyalgia rheumatica) (HCC) 03/17/2022   Presence of neurostimulator    Restless legs    Sleep apnea     Objective: VITAL SIGNS: BP 132/80 (BP Location: Left Arm, Patient Position: Sitting, Cuff Size: Normal)   Pulse (!) 112   Temp (!) 97.3 F (36.3 C) (Oral)   Ht 5' 4.5" (1.638 m)   Wt 136 lb (61.7 kg)   SpO2 93%   BMI 22.98 kg/m  Constitutional: Well formed, well developed. No acute distress. Thorax & Lungs: No accessory muscle use Musculoskeletal: Left knee.   Normal active range of motion: yes.   Normal passive range of motion: yes Tenderness to palpation: Yes, over the Pez anserine bursa and medial plica proximally Deformity:  no Ecchymosis: No Tests positive: None Tests negative: Lachman's, Stines, anterior/posterior drawer, varus/valgus stress, patellar apprehension/grind Neurologic: Normal sensory function. Psychiatric: Normal mood. Age appropriate judgment and insight. Alert & oriented x 3.    Procedure note; trigger point injection Verbal consent obtained. The 2 areas of interest were demarcated with an otoscope speculum tip. They were then cleaned with alcohol. 1.5 mL were injected at each trigger point with a combination of 1 mL of 1% M lidocaine and 20 mg of Depo-Medrol. This was repeated on the proximal lesion. The areas were then bandaged. There were no complications noted. The patient tolerated the procedure well.   Assessment:  Acute pain of left knee - Plan: methylPREDNISolone acetate (DEPO-MEDROL) injection 40 mg, Inject trigger point, 1 or 2  Plan: Stretches/exercises, heat, ice, Tylenol.  We will look into her home health/physical therapy referral. F/u as originally scheduled. The patient voiced understanding and agreement to the plan.   Jilda Roche [place], DO 09/01/22  2:33 PM

## 2022-09-09 DIAGNOSIS — G894 Chronic pain syndrome: Secondary | ICD-10-CM | POA: Diagnosis not present

## 2022-09-09 DIAGNOSIS — Z1159 Encounter for screening for other viral diseases: Secondary | ICD-10-CM | POA: Diagnosis not present

## 2022-09-09 DIAGNOSIS — M353 Polymyalgia rheumatica: Secondary | ICD-10-CM | POA: Diagnosis not present

## 2022-09-09 DIAGNOSIS — M797 Fibromyalgia: Secondary | ICD-10-CM | POA: Diagnosis not present

## 2022-09-09 DIAGNOSIS — D803 Selective deficiency of immunoglobulin G [IgG] subclasses: Secondary | ICD-10-CM | POA: Diagnosis not present

## 2022-09-10 ENCOUNTER — Encounter: Payer: Self-pay | Admitting: Family Medicine

## 2022-09-11 ENCOUNTER — Telehealth: Payer: Self-pay | Admitting: Family Medicine

## 2022-09-11 ENCOUNTER — Other Ambulatory Visit: Payer: Self-pay | Admitting: Family Medicine

## 2022-09-11 DIAGNOSIS — E039 Hypothyroidism, unspecified: Secondary | ICD-10-CM

## 2022-09-11 NOTE — Telephone Encounter (Signed)
Patient called requesting a callback from [facility] to discuss physical therapy referral. Please call patient

## 2022-09-11 NOTE — Telephone Encounter (Signed)
Spoke to [facility] and she needed clarification on the knee injection given at her last OV by PCP. She was informed if injections helpful can get again in 3 months Also asked to resubmit her [facility] PT referral to [facility] as has expired. Sent [facility] message to do so The patient verbalized understanding,

## 2022-09-15 ENCOUNTER — Telehealth: Payer: Self-pay | Admitting: Physical Medicine and Rehabilitation

## 2022-09-15 NOTE — Telephone Encounter (Signed)
Patient do not understand why she was referred to Pulmonary Rehab. Please explain.

## 2022-09-15 NOTE — Telephone Encounter (Signed)
Needing more information and clarification regarding rehab program referenced by physician.

## 2022-09-16 NOTE — Telephone Encounter (Signed)
Pt.notified

## 2022-09-18 DIAGNOSIS — D803 Selective deficiency of immunoglobulin G [IgG] subclasses: Secondary | ICD-10-CM | POA: Diagnosis not present

## 2022-09-18 DIAGNOSIS — N189 Chronic kidney disease, unspecified: Secondary | ICD-10-CM | POA: Diagnosis not present

## 2022-09-18 DIAGNOSIS — G8929 Other chronic pain: Secondary | ICD-10-CM | POA: Diagnosis not present

## 2022-09-18 DIAGNOSIS — M549 Dorsalgia, unspecified: Secondary | ICD-10-CM | POA: Diagnosis not present

## 2022-09-18 DIAGNOSIS — M353 Polymyalgia rheumatica: Secondary | ICD-10-CM | POA: Diagnosis not present

## 2022-09-18 DIAGNOSIS — G2581 Restless legs syndrome: Secondary | ICD-10-CM | POA: Diagnosis not present

## 2022-09-18 DIAGNOSIS — C3490 Malignant neoplasm of unspecified part of unspecified bronchus or lung: Secondary | ICD-10-CM | POA: Diagnosis not present

## 2022-09-18 DIAGNOSIS — G259 Extrapyramidal and movement disorder, unspecified: Secondary | ICD-10-CM | POA: Diagnosis not present

## 2022-09-18 DIAGNOSIS — G250 Essential tremor: Secondary | ICD-10-CM | POA: Diagnosis not present

## 2022-09-23 DIAGNOSIS — M549 Dorsalgia, unspecified: Secondary | ICD-10-CM | POA: Diagnosis not present

## 2022-09-23 DIAGNOSIS — G2581 Restless legs syndrome: Secondary | ICD-10-CM | POA: Diagnosis not present

## 2022-09-23 DIAGNOSIS — N189 Chronic kidney disease, unspecified: Secondary | ICD-10-CM | POA: Diagnosis not present

## 2022-09-23 DIAGNOSIS — G8929 Other chronic pain: Secondary | ICD-10-CM | POA: Diagnosis not present

## 2022-09-23 DIAGNOSIS — G259 Extrapyramidal and movement disorder, unspecified: Secondary | ICD-10-CM | POA: Diagnosis not present

## 2022-09-23 DIAGNOSIS — G250 Essential tremor: Secondary | ICD-10-CM | POA: Diagnosis not present

## 2022-09-23 DIAGNOSIS — C3490 Malignant neoplasm of unspecified part of unspecified bronchus or lung: Secondary | ICD-10-CM | POA: Diagnosis not present

## 2022-09-23 DIAGNOSIS — D803 Selective deficiency of immunoglobulin G [IgG] subclasses: Secondary | ICD-10-CM | POA: Diagnosis not present

## 2022-09-23 DIAGNOSIS — M353 Polymyalgia rheumatica: Secondary | ICD-10-CM | POA: Diagnosis not present

## 2022-09-29 DIAGNOSIS — G250 Essential tremor: Secondary | ICD-10-CM | POA: Diagnosis not present

## 2022-09-29 DIAGNOSIS — C3490 Malignant neoplasm of unspecified part of unspecified bronchus or lung: Secondary | ICD-10-CM | POA: Diagnosis not present

## 2022-09-29 DIAGNOSIS — M549 Dorsalgia, unspecified: Secondary | ICD-10-CM | POA: Diagnosis not present

## 2022-09-29 DIAGNOSIS — D803 Selective deficiency of immunoglobulin G [IgG] subclasses: Secondary | ICD-10-CM | POA: Diagnosis not present

## 2022-09-29 DIAGNOSIS — G259 Extrapyramidal and movement disorder, unspecified: Secondary | ICD-10-CM | POA: Diagnosis not present

## 2022-09-29 DIAGNOSIS — M353 Polymyalgia rheumatica: Secondary | ICD-10-CM | POA: Diagnosis not present

## 2022-09-29 DIAGNOSIS — N189 Chronic kidney disease, unspecified: Secondary | ICD-10-CM | POA: Diagnosis not present

## 2022-09-29 DIAGNOSIS — G2581 Restless legs syndrome: Secondary | ICD-10-CM | POA: Diagnosis not present

## 2022-09-29 DIAGNOSIS — G8929 Other chronic pain: Secondary | ICD-10-CM | POA: Diagnosis not present

## 2022-10-01 ENCOUNTER — Ambulatory Visit (INDEPENDENT_AMBULATORY_CARE_PROVIDER_SITE_OTHER): Payer: Medicare PPO

## 2022-10-01 VITALS — BP 99/60 | HR 102 | Temp 97.8°F | Resp 20 | Ht 64.5 in | Wt 140.0 lb

## 2022-10-01 DIAGNOSIS — M2550 Pain in unspecified joint: Secondary | ICD-10-CM

## 2022-10-01 DIAGNOSIS — M353 Polymyalgia rheumatica: Secondary | ICD-10-CM

## 2022-10-01 MED ORDER — SODIUM CHLORIDE 0.9 % IV SOLN
1000.0000 mg | Freq: Once | INTRAVENOUS | Status: AC
  Administered 2022-10-01: 1000 mg via INTRAVENOUS
  Filled 2022-10-01: qty 16

## 2022-10-01 MED ORDER — HEPARIN SOD (PORK) LOCK FLUSH 100 UNIT/ML IV SOLN
500.0000 [IU] | Freq: Once | INTRAVENOUS | Status: AC | PRN
  Administered 2022-10-01: 500 [IU]

## 2022-10-01 MED ORDER — SODIUM CHLORIDE 0.9% FLUSH: 10.0000 mL | Freq: Once | INTRAVENOUS | Status: DC | PRN

## 2022-10-01 NOTE — Progress Notes (Signed)
Diagnosis: Polyarthralgia Rheumatica  Provider:  Marshell Garfinkel MD  Procedure: Infusion  IV Type: Port a Cath, IV Location: R Chest  Solumedrol (Methylprednisolone), Dose: 1000 mg  Infusion Start Time: 1350  Infusion Stop Time: 1500  Post Infusion IV Care: Port a Cath Deaccessed/Flushed  Discharge: Condition: Good, Destination: Home . AVS Provided and AVS Declined  Performed by:  Cleophus Molt, RN

## 2022-10-02 ENCOUNTER — Telehealth: Payer: Self-pay | Admitting: Family Medicine

## 2022-10-02 DIAGNOSIS — N189 Chronic kidney disease, unspecified: Secondary | ICD-10-CM | POA: Diagnosis not present

## 2022-10-02 DIAGNOSIS — G250 Essential tremor: Secondary | ICD-10-CM | POA: Diagnosis not present

## 2022-10-02 DIAGNOSIS — G8929 Other chronic pain: Secondary | ICD-10-CM | POA: Diagnosis not present

## 2022-10-02 DIAGNOSIS — G2581 Restless legs syndrome: Secondary | ICD-10-CM | POA: Diagnosis not present

## 2022-10-02 DIAGNOSIS — M549 Dorsalgia, unspecified: Secondary | ICD-10-CM | POA: Diagnosis not present

## 2022-10-02 DIAGNOSIS — M353 Polymyalgia rheumatica: Secondary | ICD-10-CM | POA: Diagnosis not present

## 2022-10-02 DIAGNOSIS — C3490 Malignant neoplasm of unspecified part of unspecified bronchus or lung: Secondary | ICD-10-CM | POA: Diagnosis not present

## 2022-10-02 DIAGNOSIS — G259 Extrapyramidal and movement disorder, unspecified: Secondary | ICD-10-CM | POA: Diagnosis not present

## 2022-10-02 DIAGNOSIS — D803 Selective deficiency of immunoglobulin G [IgG] subclasses: Secondary | ICD-10-CM | POA: Diagnosis not present

## 2022-10-02 NOTE — Telephone Encounter (Signed)
Called HH and left detailed message of PCP ok

## 2022-10-02 NOTE — Telephone Encounter (Signed)
Caller/Agency: [facility] Number: [phone] Requesting OT/PT/Skilled Nursing/Social Work/Speech Therapy: PT Frequency: power chair evaluation

## 2022-10-07 DIAGNOSIS — G259 Extrapyramidal and movement disorder, unspecified: Secondary | ICD-10-CM | POA: Diagnosis not present

## 2022-10-07 DIAGNOSIS — D803 Selective deficiency of immunoglobulin G [IgG] subclasses: Secondary | ICD-10-CM | POA: Diagnosis not present

## 2022-10-07 DIAGNOSIS — G250 Essential tremor: Secondary | ICD-10-CM | POA: Diagnosis not present

## 2022-10-07 DIAGNOSIS — N189 Chronic kidney disease, unspecified: Secondary | ICD-10-CM | POA: Diagnosis not present

## 2022-10-07 DIAGNOSIS — G8929 Other chronic pain: Secondary | ICD-10-CM | POA: Diagnosis not present

## 2022-10-07 DIAGNOSIS — C3490 Malignant neoplasm of unspecified part of unspecified bronchus or lung: Secondary | ICD-10-CM | POA: Diagnosis not present

## 2022-10-07 DIAGNOSIS — G2581 Restless legs syndrome: Secondary | ICD-10-CM | POA: Diagnosis not present

## 2022-10-07 DIAGNOSIS — M549 Dorsalgia, unspecified: Secondary | ICD-10-CM | POA: Diagnosis not present

## 2022-10-07 DIAGNOSIS — M353 Polymyalgia rheumatica: Secondary | ICD-10-CM | POA: Diagnosis not present

## 2022-10-14 ENCOUNTER — Encounter: Payer: Self-pay | Admitting: Psychiatry

## 2022-10-14 ENCOUNTER — Ambulatory Visit (INDEPENDENT_AMBULATORY_CARE_PROVIDER_SITE_OTHER): Payer: Medicare PPO | Admitting: Psychiatry

## 2022-10-14 DIAGNOSIS — F5105 Insomnia due to other mental disorder: Secondary | ICD-10-CM | POA: Diagnosis not present

## 2022-10-14 DIAGNOSIS — F3162 Bipolar disorder, current episode mixed, moderate: Secondary | ICD-10-CM

## 2022-10-14 DIAGNOSIS — F99 Mental disorder, not otherwise specified: Secondary | ICD-10-CM

## 2022-10-14 DIAGNOSIS — F419 Anxiety disorder, unspecified: Secondary | ICD-10-CM | POA: Diagnosis not present

## 2022-10-14 NOTE — Progress Notes (Signed)
Megan Brewer [id number] 22-Jul-1946 77 y.o.  Virtual Visit via Telephone Note  I connected with pt on 10/14/22 at 11:00 AM EST by telephone and verified that I am speaking with the correct person using two identifiers.   I discussed the limitations, risks, security and privacy concerns of performing an evaluation and management service by telephone and the availability of in person appointments. I also discussed with the patient that there may be a patient responsible charge related to this service. The patient expressed understanding and agreed to proceed.   I discussed the assessment and treatment plan with the patient. The patient was provided an opportunity to ask questions and all were answered. The patient agreed with the plan and demonstrated an understanding of the instructions.   The patient was advised to call back or seek an in-person evaluation if the symptoms worsen or if the condition fails to improve as anticipated.  I provided 16 minutes of non-face-to-face time during this encounter.  The patient was located at home.  The provider was located at home.   Thayer Headings, PMHNP   Subjective:   Patient ID:  Megan Brewer is a 77 y.o. (DOB 11-Feb-1946) female.  Chief Complaint:  Chief Complaint  Patient presents with   Follow-up    Insomnia, anxiety, mood disturbance    HPI Megan Brewer presents for follow-up of anxiety, mood disturbance, and insomnia. She reports that after increase in Amitriptyline she would fall asleep and sleep well. She reports that other nights she did not sleep after taking meds. Sleep continues to vary.   She reports that she stopped several of her medications a couple of months ago- "I'm eliminating those meds."  She reports, "I don't feel any worse." She reports that she is able to laugh "very occasionally." She reports that she is able to cry and that crying is not excessive. She reports that she continues to have "spasms"  without medications. She reports that her depression "may be better." Denies anxiety other than worry about when her physical symptoms may flare. Appetite has been good. Concentration is ok overall. She reports fleeting suicidal thoughts on holidays.   She plans to stop Wellbutrin when current supply is completed.   Past Psychiatric Medication Trials: Sertraline- Was effective for about 25 years and no longer seems to be as effective. Prozac Paxil Cymbalta- may have caused psychosis. No improvement with pain.  Remeron-Helpful for her mood and caused her legs to give out.  Amitriptyline Depakote Trileptal- shaking and night sweats Gabapentin- Had throat swelling Lithium- Edema. Minimally effective Seroquel Risperidone Zyprexa- Effective for insomnia, anxiety, and mood. Excessive wt gain.  Saphris Lamotrigine Melatonin Ativan- Reports taking until 2 months ago. Valium- "great for my depression." Xanax Trazodone- Ineffective Requip- Took for years at 0.5 mg po qd. Ingrezza- multiple side effects  Review of Systems:  Review of Systems  Musculoskeletal:  Negative for gait problem.  Neurological:  Positive for tremors.       She reports that she periodically falls without warning.   Psychiatric/Behavioral:         Please refer to HPI    Medications: I have reviewed the patient's current medications.  Current Outpatient Medications  Medication Sig Dispense Refill   acetaminophen (TYLENOL) 500 MG tablet Take 500 mg by mouth every 6 (six) hours as needed for moderate pain.     azelastine (ASTELIN) 0.1 % nasal spray Place 2 sprays into both nostrils 2 (two) times daily. 30 mL 5  baclofen (LIORESAL) 10 MG tablet Take 10 mg by mouth daily as needed for muscle spasms.     buPROPion (WELLBUTRIN XL) 150 MG 24 hr tablet TAKE ONE (1) TABLET BY MOUTH EACH DAY 90 tablet 1   CALCIUM PO Take 1 tablet by mouth in the morning and at bedtime.     cholecalciferol (VITAMIN D3) 25 MCG (1000 UNIT)  tablet Take 1,000 Units by mouth daily.     cycloSPORINE (RESTASIS) 0.05 % ophthalmic emulsion Place 1 drop into both eyes in the morning, at noon, in the evening, and at bedtime.     dexlansoprazole (DEXILANT) 60 MG capsule Take 1 capsule (60 mg total) by mouth daily with breakfast. 30 capsule 11   famotidine (PEPCID) 20 MG tablet Take 1 tablet (20 mg total) by mouth at bedtime. 30 tablet 11   ferrous sulfate 325 (65 FE) MG tablet Take 650 mg by mouth daily with breakfast.     GAMMAGARD 20 GM/200ML SOLN Inject into the vein. Every 21 days     ibuprofen (ADVIL) 200 MG tablet Take 600 mg by mouth as needed for mild pain.     immune globulin, human, (GAMMAGARD S/D LESS IGA) 10 g injection Inject 2 g/kg into the vein every 21 ( twenty-one) days.     KEVZARA 200 MG/1.14ML SOAJ Inject into the skin.     levothyroxine (SYNTHROID) 75 MCG tablet TAKE ONE (1) TABLET BY MOUTH EACH DAY 30MINUTES BEFORE BREAKFAST ON EMPTY STOMACH 90 tablet 1   magnesium oxide (MAG-OX) 400 MG tablet Take 400 mg by mouth daily.      potassium chloride (KLOR-CON) 10 MEQ tablet Take 1 tablet (10 mEq total) by mouth 4 (four) times daily. 360 tablet 1   pregabalin (LYRICA) 50 MG capsule Take 1 capsule (50 mg total) by mouth 3 (three) times daily. 60 capsule 5   primidone (MYSOLINE) 50 MG tablet Take 1 tablet (50 mg total) by mouth in the morning and at bedtime. 60 tablet 3   sucralfate (CARAFATE) 1 GM/10ML suspension Take 10 mLs (1 g total) by mouth 4 (four) times daily as needed. 420 mL 3   tamsulosin (FLOMAX) 0.4 MG CAPS capsule Take 1 capsule (0.4 mg total) by mouth daily. 30 capsule 1   torsemide (DEMADEX) 20 MG tablet Take 1 tablet (20 mg total) by mouth 2 (two) times daily. 180 tablet 3   traMADol (ULTRAM) 50 MG tablet Take 2 tablets (100 mg total) by mouth every 12 (twelve) hours as needed (Pain). 120 tablet 2   UNABLE TO FIND Take 1 tablet by mouth daily. Med Name: Megan Brewer 1 by mouth daily for UTI prevention     vitamin B-12  (CYANOCOBALAMIN) 1000 MCG tablet TAKE 1 TABLET BY MOUTH DAILY     Vitamin D, Ergocalciferol, (DRISDOL) 1.25 MG (50000 UNIT) CAPS capsule Take 1 capsule (50,000 Units total) by mouth every 7 (seven) days. 20 capsule 0   COVID-19 mRNA vaccine 2023-2024 (COMIRNATY) syringe Inject into the muscle. 0.3 mL 0   heparin lock flush 100 UNIT/ML SOLN injection Inject 500 Units into the vein every 21 ( twenty-one) days.     influenza vaccine adjuvanted (FLUAD QUADRIVALENT) 0.5 ML injection Inject into the muscle. 0.5 mL 0   mupirocin ointment (BACTROBAN) 2 % Apply 1 application. topically 2 (two) times daily. 22 g 0   RSV vaccine recomb adjuvanted (AREXVY) 120 MCG/0.5ML injection Inject into the muscle. 1 each 0   sodium chloride, PF, 0.9 % injection Inject 5-10  mLs into the vein as needed (Immunoglobulin).     triamcinolone cream (KENALOG) 0.1 % Apply 1 application topically 2 (two) times daily as needed (rash).     ZINC OXIDE, TOPICAL, 10 % CREA Apply 1 application topically 2 (two) times daily as needed (rash).     No current facility-administered medications for this visit.    Medication Side Effects: None  Allergies:  Allergies  Allergen Reactions   Imitrex [Sumatriptan] Nausea And Vomiting    Ringing in the ears, migraines are worse   Tetracycline Nausea Only    Causes ringing in ears, dizziness, migraine, nausea   Corticosteroids Other (See Comments)    12/02/2016 interview by [id number]: Oral dosing causes migraines/nausea/tinnitus. Prev tolerated IV and intranasal admin w/o difficulty.   Cefixime Other (See Comments)    Headache    Ciprofloxacin Other (See Comments)    Headache, dizziness, ringing in the ears   Doxycycline Other (See Comments)    unknown   Gabapentin     Throat swells/closes   Isometheptene-Dichloral-Apap Other (See Comments)    unknown   Ketorolac     12/02/2016 interview by [id number]: Oral dosing prednisone/corticosteroids causes migraines/nausea/tinnitus. Prev tolerated IV  and intranasal admin w/o difficulty.   Prednisone Other (See Comments)    Migraine, dizzy, ringing in ears-can take it IV 12/02/2016 interview by [name]: Oral prednisone dosing causes migraines/nausea/tinnitus. Prev tolerated IV and intranasal admin w/o difficulty.   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[place])    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[place])    Lung cancer ([place]) 2011, 2014   Memory loss    Migraine    Opioid abuse ([place])    Osteoporosis    PMR (polymyalgia rheumatica) ([place]) 03/17/2022   Presence of neurostimulator    Restless legs    Sleep apnea     Family History  Problem Relation Age of Onset   Hypertension Mother    GER disease Mother    Dementia Mother    Osteoporosis Mother    Arthritis Mother    Bipolar disorder Mother    Parkinson's disease Father    Congestive Heart Failure Father    Pneumonia Father    Arthritis Father    Dementia Father    Depression Father    Asthma Sister    Depression Sister    Bipolar disorder Brother    Asthma Daughter    Colon cancer Neg Hx    Esophageal cancer Neg Hx    Stomach cancer Neg Hx    Rectal cancer Neg Hx     Social History   Socioeconomic History   Marital status: Married    Spouse name: Not on file   Number of children: 2   Years of education: Not on file   Highest education level: GED or  equivalent  Occupational History    Occupation: retired  Tobacco Use   Smoking status: Never   Smokeless tobacco: Never  Vaping Use   Vaping Use: Never used  Substance and Sexual Activity   Alcohol use: Not Currently   Drug use: Never   Sexual activity: Not on file  Other Topics Concern   Not on file  Social History Narrative   Diet: None      Caffeine: coffee, tea, sodas less than or 1 daily.      Married, if yes what year: Yes, 1972      Do you live in a house, apartment, assisted living, condo, trailer, ect: Two story house       Pets: None      Current/Past profession: Teach      Exercise: None         Living Will: No   DNR: No   POA/HPOA: No      Functional Status:   Do you have difficulty bathing or dressing yourself? yes   Do you have difficulty preparing food or eating? yes   Do you have difficulty managing your medications? yes   Do you have difficulty managing your finances?yes   Do you have difficulty affording your medications? No      Right Handed    Lives in a two story home    Social Determinants of Health   Financial Resource Strain: Not on file  Food Insecurity: Not on file  Transportation Needs: Not on file  Physical Activity: Not on file  Stress: Not on file  Social Connections: Not on file  Intimate Partner Violence: Not on file    Past Medical History, Surgical history, Social history, and Family history were reviewed and updated as appropriate.   Please see review of systems for further details on the patient's review from today.   Objective:   Physical Exam:  There were no vitals taken for this visit.  Physical Exam Neurological:     Mental Status: She is alert and oriented to person, place, and time.     Cranial Nerves: No dysarthria.  Psychiatric:        Attention and Perception: Attention and perception normal.        Speech: Speech normal.        Behavior: Behavior is cooperative.        Thought Content: Thought content normal. Thought content is not  paranoid or delusional. Thought content does not include homicidal or suicidal ideation. Thought content does not include homicidal or suicidal plan.        Cognition and Memory: Cognition and memory normal.        Judgment: Judgment normal.     Comments: Insight intact Dysphoric mood     Lab Review:     Component Value Date/Time   NA 138 06/20/2022 0958   NA 140 11/20/2021 0956   K 4.1 06/20/2022 0958   CL 102 06/20/2022 0958   CO2 26 06/20/2022 0958   GLUCOSE 98 06/20/2022 0958   BUN 29 (H) 06/20/2022 0958   BUN 17 11/20/2021 0956   CREATININE 1.64 (H) 06/20/2022 0958   CREATININE 1.55 (H) 04/01/2022 0917   CALCIUM 9.3 06/20/2022 0958   PROT 7.5 03/08/2022 1242   ALBUMIN 3.5 03/08/2022 1242   AST 28 03/08/2022 1242   ALT 25 03/08/2022 1242   ALKPHOS 81 03/08/2022 1242   BILITOT 0.7 03/08/2022 1242   GFRNONAA 33 (L) 06/20/2022 0958   GFRNONAA 33 (  L) 06/10/2020 1549   GFRAA 38 (L) 06/10/2020 1549       Component Value Date/Time   WBC 12.3 (H) 06/20/2022 0958   RBC 4.78 06/20/2022 0958   HGB 14.4 06/20/2022 0958   HCT 42.9 06/20/2022 0958   PLT 266 06/20/2022 0958   MCV 89.7 06/20/2022 0958   MCH 30.1 06/20/2022 0958   MCHC 33.6 06/20/2022 0958   RDW 12.3 06/20/2022 0958   LYMPHSABS 1.4 03/08/2022 1242   MONOABS 0.5 03/08/2022 1242   EOSABS 0.1 03/08/2022 1242   BASOSABS 0.1 03/08/2022 1242    No results found for: "POCLITH", "LITHIUM"   No results found for: "PHENYTOIN", "PHENOBARB", "VALPROATE", "CBMZ"   .res Assessment: Plan:   Patient seen for 60 minutes and time spent discussing patient's decision to stop medication for mood symptoms, anxiety, and insomnia due to limited improvement.  She reports that she plans to stop Wellbutrin when current supply is completed.  Advised patient to follow-up with provider if she feels that she would like to resume medication in the future.  Patient verbalizes understanding and indicates that she will contact the office if  needed.  Runa was seen today for follow-up.  Diagnoses and all orders for this visit:  Bipolar disorder, current episode mixed, moderate (HCC)  Anxiety disorder, unspecified type  Insomnia due to other mental disorder    Please see After Visit Summary for patient specific instructions.  Future Appointments  Date Time Provider [place]  10/27/2022 11:20 AM [name], Clide Deutscher, MD [facility]  12/31/2022  2:00 PM [facility] 4 [facility] None  02/24/2023  8:30 AM Wendling, Crosby Oyster, DO [facility]  05/25/2023  1:30 PM Narda Amber K, DO [facility] None    No orders of the defined types were placed in this encounter.     -------------------------------

## 2022-10-15 DIAGNOSIS — G8929 Other chronic pain: Secondary | ICD-10-CM | POA: Diagnosis not present

## 2022-10-15 DIAGNOSIS — N189 Chronic kidney disease, unspecified: Secondary | ICD-10-CM | POA: Diagnosis not present

## 2022-10-15 DIAGNOSIS — G259 Extrapyramidal and movement disorder, unspecified: Secondary | ICD-10-CM | POA: Diagnosis not present

## 2022-10-15 DIAGNOSIS — G250 Essential tremor: Secondary | ICD-10-CM | POA: Diagnosis not present

## 2022-10-15 DIAGNOSIS — C3490 Malignant neoplasm of unspecified part of unspecified bronchus or lung: Secondary | ICD-10-CM | POA: Diagnosis not present

## 2022-10-15 DIAGNOSIS — M549 Dorsalgia, unspecified: Secondary | ICD-10-CM | POA: Diagnosis not present

## 2022-10-15 DIAGNOSIS — G2581 Restless legs syndrome: Secondary | ICD-10-CM | POA: Diagnosis not present

## 2022-10-15 DIAGNOSIS — D803 Selective deficiency of immunoglobulin G [IgG] subclasses: Secondary | ICD-10-CM | POA: Diagnosis not present

## 2022-10-15 DIAGNOSIS — M353 Polymyalgia rheumatica: Secondary | ICD-10-CM | POA: Diagnosis not present

## 2022-10-16 ENCOUNTER — Telehealth (INDEPENDENT_AMBULATORY_CARE_PROVIDER_SITE_OTHER): Payer: Medicare PPO | Admitting: Family Medicine

## 2022-10-16 ENCOUNTER — Encounter: Payer: Self-pay | Admitting: Family Medicine

## 2022-10-16 DIAGNOSIS — G250 Essential tremor: Secondary | ICD-10-CM | POA: Diagnosis not present

## 2022-10-16 DIAGNOSIS — M353 Polymyalgia rheumatica: Secondary | ICD-10-CM | POA: Diagnosis not present

## 2022-10-16 NOTE — Progress Notes (Signed)
Chief Complaint  Patient presents with   power wheel chair paperwork    Subjective: Patient is a 77 y.o. female here for discussion of getting w wheelchair. We are interacting via web portal for an electronic face-to-face visit. I verified patient's ID using 2 identifiers. Patient agreed to proceed with visit via this method. Patient is at home, I am at office. Patient, her spouse Waunita Schooner and I are present for visit.   6.5 mo ago, started having worsening tremors and mobility. She is in a standard manual wheelchair.  She is not always able to propel herself.  A lot of times her husband will need to push her.  When he is at home, she is trapped where she is.  This is a combination of pain from polymyalgia rheumatica and shaking from her essential tremors.  She is seeing a specialist for both issues.  She has had several falls in the last month, 2 today alone.  No significant injury with this fall.  She is a high fall risk.  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  I reviewed some of the documentation, waiting on the rest of the form but we will plan to sign it.  I do agree that in her  situation, it is medically necessary for her to have a motorized wheelchair which would allow better mobility for her given her physical limitations to propel herself in a manual wheelchair. The patient and her spouse voiced understanding and agreement to the plan.  [place], DO 10/16/22  3:29 PM

## 2022-10-19 ENCOUNTER — Encounter: Payer: Self-pay | Admitting: Family Medicine

## 2022-10-19 ENCOUNTER — Other Ambulatory Visit: Payer: Self-pay | Admitting: Family Medicine

## 2022-10-19 DIAGNOSIS — G250 Essential tremor: Secondary | ICD-10-CM

## 2022-10-20 DIAGNOSIS — G2581 Restless legs syndrome: Secondary | ICD-10-CM | POA: Diagnosis not present

## 2022-10-20 DIAGNOSIS — N189 Chronic kidney disease, unspecified: Secondary | ICD-10-CM | POA: Diagnosis not present

## 2022-10-20 DIAGNOSIS — C3490 Malignant neoplasm of unspecified part of unspecified bronchus or lung: Secondary | ICD-10-CM | POA: Diagnosis not present

## 2022-10-20 DIAGNOSIS — G259 Extrapyramidal and movement disorder, unspecified: Secondary | ICD-10-CM | POA: Diagnosis not present

## 2022-10-20 DIAGNOSIS — G8929 Other chronic pain: Secondary | ICD-10-CM | POA: Diagnosis not present

## 2022-10-20 DIAGNOSIS — D803 Selective deficiency of immunoglobulin G [IgG] subclasses: Secondary | ICD-10-CM | POA: Diagnosis not present

## 2022-10-20 DIAGNOSIS — G250 Essential tremor: Secondary | ICD-10-CM | POA: Diagnosis not present

## 2022-10-20 DIAGNOSIS — M353 Polymyalgia rheumatica: Secondary | ICD-10-CM | POA: Diagnosis not present

## 2022-10-20 DIAGNOSIS — M549 Dorsalgia, unspecified: Secondary | ICD-10-CM | POA: Diagnosis not present

## 2022-10-23 ENCOUNTER — Other Ambulatory Visit: Payer: Self-pay | Admitting: Family Medicine

## 2022-10-23 DIAGNOSIS — Z1231 Encounter for screening mammogram for malignant neoplasm of breast: Secondary | ICD-10-CM

## 2022-10-27 ENCOUNTER — Encounter: Payer: Medicare PPO | Attending: Physical Medicine and Rehabilitation | Admitting: Physical Medicine and Rehabilitation

## 2022-10-27 ENCOUNTER — Encounter: Payer: Self-pay | Admitting: Physical Medicine and Rehabilitation

## 2022-10-27 VITALS — BP 105/63 | HR 75 | Ht 64.5 in

## 2022-10-27 DIAGNOSIS — I959 Hypotension, unspecified: Secondary | ICD-10-CM | POA: Insufficient documentation

## 2022-10-27 DIAGNOSIS — M542 Cervicalgia: Secondary | ICD-10-CM | POA: Diagnosis not present

## 2022-10-27 DIAGNOSIS — M62838 Other muscle spasm: Secondary | ICD-10-CM

## 2022-10-27 DIAGNOSIS — M353 Polymyalgia rheumatica: Secondary | ICD-10-CM | POA: Diagnosis not present

## 2022-10-27 DIAGNOSIS — G259 Extrapyramidal and movement disorder, unspecified: Secondary | ICD-10-CM | POA: Diagnosis not present

## 2022-10-27 MED ORDER — METHOCARBAMOL 750 MG PO TABS: 750.0000 mg | ORAL_TABLET | Freq: Four times a day (QID) | ORAL | 3 refills | Status: DC

## 2022-10-27 NOTE — Progress Notes (Addendum)
Subjective:    Patient ID: Megan Brewer, female    DOB: 1945-09-19, 77 y.o.   MRN: [id number]  HPI 1) Diffuse pain, has been attributed to polymyalgia rheumatica [name] is a 77 year old woman who presents to establish care for diffuse pain.  -it is present in her wrists, clavicles, left hip -the prednisone helps to control the pain but it only lasts 6 weeks and by the times she could go for the second infusion it was bad.  -she will be seeing her rheumatologist in a few weeks and would be very grateful if he were able to increased the frequency to q6 weeks -has been pretty good  -The Iv prednisone helped a great deal for some time.  -she has f/u with rheumatology -they have tried high doses of prednisone, low does hydrocodone, low doses Tramadol, lidocaine patches. Nothing has been helpful yet -she thinks the pain started after she spent 1 month at [facility] for sepsis and C diff. She left the hospital on June 1st. On June 4th she started with breathing difficulties, shaking, and some pain. It gradually got worse.  -her husband accompanies her today -steroids did not help leg pain -interested in cervical traction device  2) Shortness of breath -breathing without pain for the first time! -had been present July 2022 to July 2023 -she also feels a deflated rubber tire on her chest -she has been told her heart and lungs are fine -the steroids may have helped her to breath better -at the hospital where she was diagnosed she got a massive IV dose of steroids and this cleared up her lungs  3) Muscle spasms: -she has not tried anything for this.     Pain Inventory Average Pain 5 Pain Right Now 6 My pain is intermittent, constant, sharp, burning, dull, stabbing, tingling, and aching  In the last 24 hours, has pain interfered with the following? General activity 2 Relation with others 7 Enjoyment of life 10 What TIME of day is your pain at its worst? daytime and  evening Sleep (in general) Poor  Pain is worse with: unsure Pain improves with: rest, heat/ice, and medication Relief from Meds: 5    New pt    Family History  Problem Relation Age of Onset   Hypertension Mother    GER disease Mother    Dementia Mother    Osteoporosis Mother    Arthritis Mother    Bipolar disorder Mother    Parkinson's disease Father    Congestive Heart Failure Father    Pneumonia Father    Arthritis Father    Dementia Father    Depression Father    Asthma Sister    Depression Sister    Bipolar disorder Brother    Asthma Daughter    Colon cancer Neg Hx    Esophageal cancer Neg Hx    Stomach cancer Neg Hx    Rectal cancer Neg Hx    Social History   Socioeconomic History   Marital status: Married    Spouse name: Not on file   Number of children: 2   Years of education: Not on file   Highest education level: GED or equivalent  Occupational History   Occupation: retired  Tobacco Use   Smoking status: Never   Smokeless tobacco: Never  Vaping Use   Vaping Use: Never used  Substance and Sexual Activity   Alcohol use: Not Currently   Drug use: Never   Sexual activity: Not on  file  Other Topics Concern   Not on file  Social History Narrative   Diet: None      Caffeine: coffee, tea, sodas less than or 1 daily.      Married, if yes what year: Yes, 1972      Do you live in a house, apartment, assisted living, condo, trailer, ect: Two story house       Pets: None      Current/Past profession: Teach      Exercise: None         Living Will: No   DNR: No   POA/HPOA: No      Functional Status:   Do you have difficulty bathing or dressing yourself? yes   Do you have difficulty preparing food or eating? yes   Do you have difficulty managing your medications? yes   Do you have difficulty managing your finances?yes   Do you have difficulty affording your medications? No      Right Handed    Lives in a two story home    Social  Determinants of Health   Financial Resource Strain: Not on file  Food Insecurity: Not on file  Transportation Needs: Not on file  Physical Activity: Not on file  Stress: Not on file  Social Connections: Not on file   Past Surgical History:  Procedure Laterality Date   ABDOMINAL HYSTERECTOMY     CARPAL TUNNEL RELEASE     CATARACT EXTRACTION Bilateral 2019   CHOLECYSTECTOMY     COLONOSCOPY  2015   [facility]   CT LUNG SCREENING  2018   DENTAL SURGERY  06/17/2021   4 teeth rmoved   DG  BONE DENSITY ([place] [facility])  2018   DIAGNOSTIC MAMMOGRAM  2019   GASTRIC BYPASS     LUNG CANCER SURGERY  02/2010   MULTIPLE TOOTH EXTRACTIONS     PORT A CATH REVISION Right 04/2021   PORT-A-CATH REMOVAL Left 02/07/2021   Procedure: REMOVAL PORT-A-CATH;  Surgeon: Mickeal Skinner, MD;  Location: [facility];  Service: General;  Laterality: Left;  60 MINUTES ROOM 4   SPINAL CORD STIMULATOR IMPLANT     Past Medical History:  Diagnosis Date   Anemia    Arachnoiditis    Bipolar disorder ([place])    Cataract    removed   Chronic post-thoracotomy pain    CKD (chronic kidney disease)    GERD (gastroesophageal reflux disease)    Hypogammaglobulinemia (HCC)    Hypotension    Hypothyroid    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HCC)    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[place])    Lung cancer ([place]) 2011, 2014   Memory loss    Migraine    Opioid abuse ([place])    Osteoporosis    PMR (polymyalgia rheumatica) ([place]) 03/17/2022   Presence of neurostimulator    Restless legs    Sleep apnea    BP 105/63   Pulse 75   Ht 5' 4.5" (1.638 m)   SpO2 98%   BMI 23.66 kg/m   Opioid Risk Score:   Fall Risk Score:  `1  Depression screen [facility] 2/9     06/11/2022   10:09 AM 09/30/2021   11:02 AM 02/04/2021    3:28 PM 09/27/2020   10:57 AM 06/10/2020    2:51 PM 09/25/2019    1:06 PM 07/05/2019    1:18 PM  Depression screen PHQ 2/9  Decreased Interest 3 0 0 0 0 0 0  Down, Depressed, Hopeless 3 0 0 0  0 0 0  PHQ - 2 Score 6 0 0 0  0 0 0  Altered sleeping 3        Tired, decreased energy 3        Change in appetite 2        Feeling bad or failure about yourself  3        Trouble concentrating 0        Moving slowly or fidgety/restless 3        Suicidal thoughts 3        PHQ-9 Score 23        Difficult doing work/chores Extremely dIfficult            Review of Systems  Respiratory:  Positive for shortness of breath.   Gastrointestinal:  Positive for constipation and nausea.  Musculoskeletal:  Positive for gait problem.       Spasms  Neurological:  Positive for dizziness, tremors and weakness.  Psychiatric/Behavioral:  Positive for dysphoric mood. The patient is nervous/anxious.   All other systems reviewed and are negative.      Objective:   Physical Exam Gen: no distress, normal appearing HEENT: oral mucosa pink and moist, NCAT Cardio: Reg rate Chest: normal effort, normal rate of breathing Abd: soft, non-distended Ext: no edema Psych: pleasant, normal affect Skin: intact Neuro: Alert and oriented x3. Slow speech MSK: protracted cervical posture    Assessment & Plan:   1) Chronic Pain Syndrome  with diffuse pain secondary to Polymylagia rheumatica -Discussed current symptoms of pain and history of pain.  -Discussed benefits of exercise in reducing pain. -discussed that the only thing that has been helpful was IV steroids. Continue, recommended discussing with rheumatology increasing frequency to q6 weeks.  -discontinue cymbalta '20mg'$  daily.  -continue ergocalciferol 50,000U  -will send message to her care team (Dr. Nani Ravens PCP, Dr. Percival Spanish cardiologist, Dr. Tana Coast rheumatologist) to discuss doing IV steroid treatment.  -will call her rheumatologist today to discuss Kevzara and Plaquenil.  -discussed her follow-up with rheumatology, who placed her on low dose prednisone '5mg'$  BID.  -Discussed following foods that may reduce pain: 1) Ginger (especially studied for arthritis)- reduce  leukotriene production to decrease inflammation 2) Blueberries- high in phytonutrients that decrease inflammation 3) Salmon- marine omega-3s reduce joint swelling and pain 4) Pumpkin seeds- reduce inflammation 5) dark chocolate- reduces inflammation 6) turmeric- reduces inflammation 7) tart cherries - reduce pain and stiffness 8) extra virgin olive oil - its compound olecanthal helps to block prostaglandins  9) chili peppers- can be eaten or applied topically via capsaicin 10) mint- helpful for headache, muscle aches, joint pain, and itching 11) garlic- reduces inflammation  Link to further information on diet for chronic pain: [email or web address]    2) Shortness of breath secondary to lung cancer -discussed that it started one year ago -discussed that cardio pulmonary workup has been normal -discussed that her IV steroids were very helpful.  -ordered pulmonary rehab  3) Muscle spasms: -prescribed robaxin  4) Cervicalgia: -.Prescribed Zynex Nexwave, cervical traction device -discussed trigger point injections   5) Movement disorder: -referred to [facility] Movement Disorders clinic -discussed that she cannot do MRIs.

## 2022-10-27 NOTE — Addendum Note (Signed)
Addended by: Izora Ribas on: 10/27/2022 11:34 AM   Modules accepted: Orders

## 2022-10-28 ENCOUNTER — Ambulatory Visit: Payer: Medicare PPO | Admitting: Rheumatology

## 2022-10-29 ENCOUNTER — Telehealth (HOSPITAL_COMMUNITY): Payer: Self-pay | Admitting: *Deleted

## 2022-10-29 ENCOUNTER — Telehealth: Payer: Self-pay | Admitting: Family Medicine

## 2022-10-29 DIAGNOSIS — M549 Dorsalgia, unspecified: Secondary | ICD-10-CM | POA: Diagnosis not present

## 2022-10-29 DIAGNOSIS — N189 Chronic kidney disease, unspecified: Secondary | ICD-10-CM | POA: Diagnosis not present

## 2022-10-29 DIAGNOSIS — G2581 Restless legs syndrome: Secondary | ICD-10-CM | POA: Diagnosis not present

## 2022-10-29 DIAGNOSIS — G8929 Other chronic pain: Secondary | ICD-10-CM | POA: Diagnosis not present

## 2022-10-29 DIAGNOSIS — M353 Polymyalgia rheumatica: Secondary | ICD-10-CM | POA: Diagnosis not present

## 2022-10-29 DIAGNOSIS — D803 Selective deficiency of immunoglobulin G [IgG] subclasses: Secondary | ICD-10-CM | POA: Diagnosis not present

## 2022-10-29 DIAGNOSIS — G259 Extrapyramidal and movement disorder, unspecified: Secondary | ICD-10-CM | POA: Diagnosis not present

## 2022-10-29 DIAGNOSIS — G250 Essential tremor: Secondary | ICD-10-CM | POA: Diagnosis not present

## 2022-10-29 DIAGNOSIS — C3490 Malignant neoplasm of unspecified part of unspecified bronchus or lung: Secondary | ICD-10-CM | POA: Diagnosis not present

## 2022-10-29 NOTE — Telephone Encounter (Signed)
Received referral notification for this pt to participate in Pulmonary rehab.  Reviewed medical history.  Left message regarding her ability to participate in an outpatient group rehab program. Noted that she is currently receiving Home health PT and she has made a request for motorized scooter. Cherre Huger, BSN Cardiac and Training and development officer

## 2022-10-29 NOTE — Telephone Encounter (Signed)
Pt requesting call back at Megan Brewer's earliest convenience. She said it is really involved so she did not want to provide any details about the purpose of call.

## 2022-10-30 ENCOUNTER — Other Ambulatory Visit: Payer: Self-pay

## 2022-10-30 ENCOUNTER — Emergency Department (HOSPITAL_BASED_OUTPATIENT_CLINIC_OR_DEPARTMENT_OTHER)
Admission: EM | Admit: 2022-10-30 | Discharge: 2022-10-30 | Disposition: A | Discharge status: Home / Self Care | Payer: Medicare PPO | Attending: Emergency Medicine | Admitting: Emergency Medicine

## 2022-10-30 ENCOUNTER — Encounter (HOSPITAL_BASED_OUTPATIENT_CLINIC_OR_DEPARTMENT_OTHER): Payer: Self-pay | Admitting: Emergency Medicine

## 2022-10-30 ENCOUNTER — Emergency Department (HOSPITAL_BASED_OUTPATIENT_CLINIC_OR_DEPARTMENT_OTHER): Payer: Medicare PPO

## 2022-10-30 ENCOUNTER — Telehealth: Payer: Self-pay | Admitting: *Deleted

## 2022-10-30 DIAGNOSIS — R0602 Shortness of breath: Secondary | ICD-10-CM | POA: Diagnosis not present

## 2022-10-30 DIAGNOSIS — R7989 Other specified abnormal findings of blood chemistry: Secondary | ICD-10-CM | POA: Diagnosis not present

## 2022-10-30 DIAGNOSIS — R531 Weakness: Secondary | ICD-10-CM | POA: Diagnosis not present

## 2022-10-30 DIAGNOSIS — R42 Dizziness and giddiness: Secondary | ICD-10-CM | POA: Diagnosis not present

## 2022-10-30 LAB — CBC WITH DIFFERENTIAL/PLATELET
Abs Immature Granulocytes: 0.03 10*3/uL (ref 0.00–0.07)
Basophils Absolute: 0 10*3/uL (ref 0.0–0.1)
Basophils Relative: 1 %
Eosinophils Absolute: 0 10*3/uL (ref 0.0–0.5)
Eosinophils Relative: 1 %
HCT: 36.8 % (ref 36.0–46.0)
Hemoglobin: 12.5 g/dL (ref 12.0–15.0)
Immature Granulocytes: 1 %
Lymphocytes Relative: 32 %
Lymphs Abs: 1.4 10*3/uL (ref 0.7–4.0)
MCH: 29.9 pg (ref 26.0–34.0)
MCHC: 34 g/dL (ref 30.0–36.0)
MCV: 88 fL (ref 80.0–100.0)
Monocytes Absolute: 0.5 10*3/uL (ref 0.1–1.0)
Monocytes Relative: 12 %
Neutro Abs: 2.4 10*3/uL (ref 1.7–7.7)
Neutrophils Relative %: 53 %
Platelets: 150 10*3/uL (ref 150–400)
RBC: 4.18 MIL/uL (ref 3.87–5.11)
RDW: 12.6 % (ref 11.5–15.5)
WBC: 4.4 10*3/uL (ref 4.0–10.5)
nRBC: 0 % (ref 0.0–0.2)

## 2022-10-30 LAB — BASIC METABOLIC PANEL
Anion gap: 6 (ref 5–15)
BUN: 37 mg/dL — ABNORMAL HIGH (ref 8–23)
CO2: 24 mmol/L (ref 22–32)
Calcium: 8 mg/dL — ABNORMAL LOW (ref 8.9–10.3)
Chloride: 105 mmol/L (ref 98–111)
Creatinine, Ser: 1.27 mg/dL — ABNORMAL HIGH (ref 0.44–1.00)
GFR, Estimated: 44 mL/min — ABNORMAL LOW (ref >60)
Glucose, Bld: 87 mg/dL (ref 70–99)
Potassium: 3.6 mmol/L (ref 3.5–5.1)
Sodium: 135 mmol/L (ref 135–145)

## 2022-10-30 LAB — CBG MONITORING, ED: Glucose-Capillary: 71 mg/dL (ref 70–99)

## 2022-10-30 LAB — TROPONIN I (HIGH SENSITIVITY)
Troponin I (High Sensitivity): 6 ng/L (ref <18)
Troponin I (High Sensitivity): 6 ng/L (ref <18)

## 2022-10-30 LAB — MAGNESIUM: Magnesium: 1.9 mg/dL (ref 1.7–2.4)

## 2022-10-30 MED ORDER — SODIUM CHLORIDE 0.9 % IV BOLUS
500.0000 mL | Freq: Once | INTRAVENOUS | Status: AC
  Administered 2022-10-30: 500 mL via INTRAVENOUS

## 2022-10-30 MED ORDER — HEPARIN SOD (PORK) LOCK FLUSH 100 UNIT/ML IV SOLN
500.0000 [IU] | Freq: Once | INTRAVENOUS | Status: AC
  Administered 2022-10-30: 500 [IU]

## 2022-10-30 MED ORDER — HEPARIN SOD (PORK) LOCK FLUSH 100 UNIT/ML IV SOLN
INTRAVENOUS | Status: AC
  Filled 2022-10-30: qty 5

## 2022-10-30 NOTE — Telephone Encounter (Signed)
Megan Brewer called to report that her BP was 60/40 and she felt "like crap". I advised her to hang up and call 911. She asked if her husband could take her and I advised against it but that was their choice to make. She was taking about her muscle relaxer Dr Ranell Patrick gave her. I advised her to go to ED and get treated and then she could send Mychart message about medicine questions.

## 2022-10-30 NOTE — ED Provider Notes (Addendum)
[facility] Provider Note   CSN: [id number] Arrival date & time: 10/30/22  1258     History  Chief Complaint  Patient presents with   Dizziness    Megan Brewer is a 77 y.o. female with a past medical history of PMR who presents emergency department brought in by EMS with concerns for dizziness onset last night. Patient has associated generalized weakness, shortness of breath, lightheadedness.  Notes dizziness that has been ongoing since last night.  Notes that she has been taking Robaxin for the past 2 days for her muscle spasms to her bilateral lower extremities.  She has been taking this 4 times a day.  Denies numbness, tingling, abdominal pain, nausea, vomiting.  The history is provided by the patient. No language interpreter was used.       Home Medications Prior to Admission medications   Medication Sig Start Date End Date Taking? Authorizing Provider  acetaminophen (TYLENOL) 500 MG tablet Take 1,000 mg by mouth as needed for moderate pain.   Yes [provider]  azelastine (ASTELIN) 0.1 % nasal spray Place 2 sprays into both nostrils 2 (two) times daily. Patient taking differently: Place 2 sprays into both nostrils as needed for rhinitis or allergies. 03/10/21  Yes Ambs, Kathrine Cords, FNP  baclofen (LIORESAL) 10 MG tablet Take 10 mg by mouth daily as needed for muscle spasms. 06/03/20  Yes [provider]  CALCIUM PO Take 1 tablet by mouth in the morning and at bedtime.   Yes [provider]  cholecalciferol (VITAMIN D3) 25 MCG (1000 UNIT) tablet Take 1,000 Units by mouth daily.   Yes [provider]  cycloSPORINE (RESTASIS) 0.05 % ophthalmic emulsion Place 1 drop into both eyes in the morning, at noon, in the evening, and at bedtime.   Yes [provider]  dexlansoprazole (DEXILANT) 60 MG capsule Take 1 capsule (60 mg total) by mouth daily with breakfast. 04/30/22  Yes Esterwood, Amy S,  PA-C  famotidine (PEPCID) 20 MG tablet Take 1 tablet (20 mg total) by mouth at bedtime. 04/30/22  Yes Esterwood, Amy S, PA-C  ferrous sulfate 325 (65 FE) MG tablet Take 650 mg by mouth daily with breakfast.   Yes [provider]  GAMMAGARD 20 GM/200ML SOLN Inject 40 g into the vein every 21 ( twenty-one) days. INFUSE 40G INTRAVENOUSLY EVERY 3 WEEKS 02/27/22  Yes [provider]  heparin lock flush 100 UNIT/ML SOLN injection Inject 500 Units into the vein every 21 ( twenty-one) days. 03/31/21  Yes [provider]  ibuprofen (ADVIL) 200 MG tablet Take 600 mg by mouth as needed for mild pain.   Yes [provider]  KEVZARA 200 MG/1.14ML SOAJ Inject 1.14 mLs into the skin every 14 (fourteen) days. 09/09/22  Yes [provider]  levothyroxine (SYNTHROID) 75 MCG tablet TAKE ONE (1) TABLET BY MOUTH EACH DAY 30MINUTES BEFORE BREAKFAST ON EMPTY STOMACH Patient taking differently: Take 75 mcg by mouth daily before breakfast. 30 MINUTES BEFORE BREAKFAST ON EMPTY STOMACH. 09/11/22  Yes Shelda Pal, DO  magnesium oxide (MAG-OX) 400 MG tablet Take 400 mg by mouth daily.    Yes [provider]  methocarbamol (ROBAXIN-750) 750 MG tablet Take 1 tablet (750 mg total) by mouth 4 (four) times daily. 10/27/22  Yes [name], Clide Deutscher, MD  MYRBETRIQ 50 MG TB24 tablet Take 50 mg by mouth daily. 10/07/22  Yes [provider]  potassium chloride (KLOR-CON) 10 MEQ tablet  Take 1 tablet (10 mEq total) by mouth 4 (four) times daily. 05/04/22  Yes Wendling, Crosby Oyster, DO  pregabalin (LYRICA) 50 MG capsule Take 1 capsule (50 mg total) by mouth 3 (three) times daily. 08/25/22  Yes Shelda Pal, DO  sodium chloride, PF, 0.9 % injection Inject 5-10 mLs into the vein as needed (Immunoglobulin). 02/12/21  Yes [provider]  sucralfate (CARAFATE) 1 GM/10ML suspension Take 10 mLs (1 g total) by mouth 4 (four) times daily as needed. 07/13/22  Yes  Shelda Pal, DO  tamsulosin (FLOMAX) 0.4 MG CAPS capsule Take 1 capsule (0.4 mg total) by mouth daily. 01/23/21  Yes Shelly Coss, MD  torsemide (DEMADEX) 20 MG tablet Take 1 tablet (20 mg total) by mouth 2 (two) times daily. 09/01/22  Yes Wendling, Crosby Oyster, DO  traMADol (ULTRAM) 50 MG tablet Take 2 tablets (100 mg total) by mouth every 12 (twelve) hours as needed (Pain). Patient taking differently: Take 50-100 mg by mouth as needed (Pain). 06/23/22  Yes Shelda Pal, DO  triamcinolone cream (KENALOG) 0.1 % Apply 1 application topically 2 (two) times daily as needed (rash).   Yes [provider]  UNABLE TO FIND Take 1 tablet by mouth daily. Med Name: Megan Brewer 1 by mouth daily for UTI prevention   Yes [provider]  vitamin B-12 (CYANOCOBALAMIN) 1000 MCG tablet Take 1,000 mcg by mouth daily. 02/14/21  Yes [provider]  Vitamin D, Ergocalciferol, (DRISDOL) 1.25 MG (50000 UNIT) CAPS capsule Take 1 capsule (50,000 Units total) by mouth every 7 (seven) days. Patient taking differently: Take 50,000 Units by mouth every 7 (seven) days. Thursdays 08/27/22  Yes [name], Clide Deutscher, MD  ZINC OXIDE, TOPICAL, 10 % CREA Apply 1 application topically 2 (two) times daily as needed (rash). 02/14/21  Yes Aline August, MD  buPROPion (WELLBUTRIN XL) 150 MG 24 hr tablet TAKE ONE (1) TABLET BY MOUTH EACH DAY Patient not taking: Reported on 10/30/2022 07/14/22   Thayer Headings, PMHNP  COVID-19 mRNA vaccine [phone] (COMIRNATY) syringe Inject into the muscle. Patient not taking: Reported on 10/30/2022 06/03/22   Carlyle Basques, MD  influenza vaccine adjuvanted (FLUAD QUADRIVALENT) 0.5 ML injection Inject into the muscle. Patient not taking: Reported on 10/30/2022 06/03/22   Carlyle Basques, MD  mupirocin ointment (BACTROBAN) 2 % Apply 1 application. topically 2 (two) times daily. Patient not taking: Reported on 10/30/2022 12/29/21   [name], NP  primidone  (MYSOLINE) 50 MG tablet Take 1 tablet (50 mg total) by mouth in the morning and at bedtime. Patient not taking: Reported on 10/30/2022 08/25/22   Shelda Pal, DO  RSV vaccine recomb adjuvanted (AREXVY) 120 MCG/0.5ML injection Inject into the muscle. Patient not taking: Reported on 10/30/2022 06/11/22   Carlyle Basques, MD      Allergies    Butorphanol, Erythromycin base, Propoxyphene, Sumatriptan, Tetracycline, Ciprofloxacin, Corticosteroids, Doxycycline, Cefixime, Gabapentin, Isometheptene-dichloral-apap, Ketorolac, Prednisone, Promethazine, and Tape    Review of Systems   Review of Systems  Neurological:  Positive for dizziness.  All other systems reviewed and are negative.   Physical Exam Updated Vital Signs BP 107/63 (BP Location: Right Arm)   Pulse 68   Temp 97.7 F (36.5 C) (Oral)   Resp 16   SpO2 100%  Physical Exam Vitals and nursing note reviewed.  Constitutional:      General: She is not in acute distress.    Appearance: She is not diaphoretic.  HENT:     Head: Normocephalic  and atraumatic.     Mouth/Throat:     Pharynx: No oropharyngeal exudate.  Eyes:     General: No scleral icterus.    Conjunctiva/sclera: Conjunctivae normal.  Cardiovascular:     Rate and Rhythm: Normal rate and regular rhythm.     Pulses: Normal pulses.     Heart sounds: Normal heart sounds.  Pulmonary:     Effort: Pulmonary effort is normal. No respiratory distress.     Breath sounds: Normal breath sounds. No wheezing.  Abdominal:     General: Bowel sounds are normal.     Palpations: Abdomen is soft. There is no mass.     Tenderness: There is no abdominal tenderness. There is no guarding or rebound.  Musculoskeletal:        General: Normal range of motion.     Cervical back: Normal range of motion and neck supple.  Skin:    General: Skin is warm and dry.  Neurological:     Mental Status: She is alert.     Comments: No focal neurological deficits. Negative pronator drift.  Strength and sensation intact to BUE and BLE. Grip strength 5/5 bilaterally.  Normal finger-nose testing.  Normal heel-to-shin testing.  Cranial nerves II through XII intact.  Psychiatric:        Behavior: Behavior normal.     ED Results / Procedures / Treatments   Labs (all labs ordered are listed, but only abnormal results are displayed) Labs Reviewed  BASIC METABOLIC PANEL - Abnormal; Notable for the following components:      Result Value   BUN 37 (*)    Creatinine, Ser 1.27 (*)    Calcium 8.0 (*)    GFR, Estimated 44 (*)    All other components within normal limits  CBC WITH DIFFERENTIAL/PLATELET  MAGNESIUM  URINALYSIS, ROUTINE W REFLEX MICROSCOPIC  CBG MONITORING, ED  TROPONIN I (HIGH SENSITIVITY)  TROPONIN I (HIGH SENSITIVITY)    EKG EKG Interpretation  Date/Time:  Friday October 30 2022 13:14:54 EDT Ventricular Rate:  69 PR Interval:  149 QRS Duration: 140 QT Interval:  444 QTC Calculation: 476 R Axis:   30 Text Interpretation: Sinus rhythm Left bundle branch block No significant change since last tracing Confirmed by Dorie Rank [phone]) on 10/30/2022 1:21:45 PM  Radiology CT HEAD WO CONTRAST  Result Date: 10/30/2022 CLINICAL DATA:  Dizziness, lightheadedness EXAM: CT HEAD WITHOUT CONTRAST TECHNIQUE: Contiguous axial images were obtained from the base of the skull through the vertex without intravenous contrast. RADIATION DOSE REDUCTION: This exam was performed according to the departmental dose-optimization program which includes automated exposure control, adjustment of the mA and/or kV according to patient size and/or use of iterative reconstruction technique. COMPARISON:  None Available. FINDINGS: Brain: No acute intracranial hemorrhage. No focal mass lesion. No CT evidence of acute infarction. No midline shift or mass effect. No hydrocephalus. Basilar cisterns are patent. Mild periventricular white matter hypodensities. Vascular: No hyperdense vessel or unexpected  calcification. Skull: Normal. Negative for fracture or focal lesion. Sinuses/Orbits: Paranasal sinuses and mastoid air cells are clear. Orbits are clear. Other: None. IMPRESSION: No acute intracranial findings. Electronically Signed   By: Suzy Bouchard M.D.   On: 10/30/2022 13:47    Procedures Procedures    Medications Ordered in ED Medications  sodium chloride 0.9 % bolus 500 mL (0 mLs Intravenous Stopped 10/30/22 1603)    ED Course/ Medical Decision Making/ A&P Clinical Course as of 10/30/22 1831  Fri Oct 30, 2022  1501 Patient reevaluated  and resting comfortably on stretcher. [SB]  1737 Stable 76 YOF with a chief complaint of dizziness. New Muscle relaxer. Offerred urine but patient doesn't want to wait on sample. Had orthostasis [CC]  1739 Pt re-evaluated and noted improvement of symptoms with treatment plan. Discussed with patient lab and imaging findings. Answered all available questions.  [SB]  [id number] Notified by tech that patient was able to ambulate in the room without assistance.  Patient was noted to be shaky however patient notes that this is her baseline and she is able to ambulate further than what she typically does at home.  Patient denies dizziness or shortness of breath. [SB]  [id number] Discussed with patient discharge treatment plan. Again advised patient to discontinue the Robaxin and follow-up with her primary care provider regarding today's ED visit. Patient for safe discharge at this time. [SB]    Clinical Course User Index [CC] Tretha Sciara, MD [SB] Syla Devoss A, PA-C                             Medical Decision Making Amount and/or Complexity of Data Reviewed Labs: ordered. Radiology: ordered.   Pt presents with concerns for dizziness onset last night.  Notes that she has been taking Robaxin recently.  Patient afebrile.  On exam patient without any focal neurological deficits.  No acute cardiovascular, aspiratory, lung exam findings.  Differential  diagnosis includes arrhythmia, hypoglycemia, electrolyte abnormality, ACS, acute cystitis.   Labs:  I ordered, and personally interpreted labs.  The pertinent results include:   Initial and delta troponin is 6. Magnesium at 1.9 BMP with slightly elevated creatinine at 1.27, improved from previous value. CBC unremarkable. CBG is 71  Imaging: I ordered imaging studies including CT head without I independently visualized and interpreted imaging which showed: No acute findings I agree with the radiologist interpretation  Medications:  I ordered medication including IVF for symptom management Reevaluation of the patient after these medicines and interventions, I reevaluated the patient and found that they have improved I have reviewed the patients home medicines and have made adjustments as needed   Disposition: Presentation suspicious for dizziness, likely in the setting of taking high-dose Robaxin 4 times daily.  Doubt concerns at this time for intracranial abnormality.  Doubt concerns for electrolyte abnormality, ACS, hypoglycemia.  Patient notes that she does not feel like she has a UTI at this time and does not feel as if she needs to have a urine done at this time.  Patient ambulated in the emergency department without assistance or difficulty and without exacerbation of her symptoms.  Patient notes that she was able to ambulate further in the emergency department that she typically does at home.  After consideration of the diagnostic results and the patients response to treatment, I feel that the patient would benefit from Discharge home. Supportive care measures and strict return precautions discussed with patient at bedside. Pt acknowledges and verbalizes understanding. Pt appears safe for discharge. Follow up as indicated in discharge paperwork.    This chart was dictated using voice recognition software, Dragon. Despite the best efforts of this provider to proofread and correct  errors, errors may still occur which can change documentation meaning.   Final Clinical Impression(s) / ED Diagnoses Final diagnoses:  Dizziness    Rx / DC Orders ED Discharge Orders     None         Daoud Lobue A, PA-C 10/30/22 1831  Keyaan Lederman A, PA-C 10/30/22 1831    Tretha Sciara, MD 10/30/22 2300

## 2022-10-30 NOTE — Discharge Instructions (Addendum)
It was a pleasure taking care of you today!  Your labs did not show any concerning emergent findings today.  CT scan did not show any concerning emergent findings today.  It is advised that you discontinue use of the Robaxin until you are evaluated by your primary care provider.  Ensure to maintain fluid intake with water, tea, Gatorade, Pedialyte, soup, broth.  Follow-up with your primary care provider next week regarding today's ED visit.  Return to the emergency department if you experience increasing/worsening symptoms.

## 2022-10-30 NOTE — ED Notes (Signed)
Patient transported to CT 

## 2022-10-30 NOTE — Telephone Encounter (Signed)
Spoke to the patient and her pain management doctor, Dr. Ranell Patrick would like to change her infusions to every 6 weeks and not every 3 months as they are set up now.  She states it wears off at around 6 weeks.

## 2022-10-30 NOTE — Telephone Encounter (Signed)
Clarify where to send order/infusion information

## 2022-10-30 NOTE — ED Notes (Signed)
Patient could walk from her bed to the room door (4 times) with assistance and a walker. Patient says she feels tired. Patient states that she normally cannot walk as much as she did today.

## 2022-10-30 NOTE — ED Triage Notes (Signed)
Pt BIB [facility] with c/o dizziness since last night. Hx of tremors at baseline. Currently taking robaxin. Pt was able to stand and pivot from motorized wheelchair at home for EMS

## 2022-11-02 ENCOUNTER — Other Ambulatory Visit: Payer: Self-pay | Admitting: Pharmacy Technician

## 2022-11-02 ENCOUNTER — Telehealth: Payer: Self-pay

## 2022-11-02 ENCOUNTER — Encounter: Payer: Medicare PPO | Admitting: Physical Medicine and Rehabilitation

## 2022-11-02 DIAGNOSIS — I959 Hypotension, unspecified: Secondary | ICD-10-CM

## 2022-11-02 NOTE — Telephone Encounter (Signed)
Patient was seen in the ED on 10/29/2021. She was told to contact you about the muscle relaxant. Patient stated she is cutting it back but still having issues. She would like a call back to discuss the problem.   ED NOTE:    Megan Brewer is a 77 y.o. female with a past medical history of PMR who presents emergency department brought in by EMS with concerns for dizziness onset last night. Patient has associated generalized weakness, shortness of breath, lightheadedness.  Notes dizziness that has been ongoing since last night.  Notes that she has been taking Robaxin for the past 2 days for her muscle spasms to her bilateral lower extremities.  She has been taking this 4 times a day.  Denies numbness, tingling, abdominal pain, nausea, vomiting.   The history is provided by the patient. No language interpreter was used.   Call back phone [phone].

## 2022-11-02 NOTE — Progress Notes (Signed)
Left message for patient

## 2022-11-02 NOTE — Telephone Encounter (Signed)
Called the [facility] [place] Changed frequency as requested/instruction by PCP Last infusion was on 10/01/22 Next infusion will now be on 11/12/2022 They will take care of changing this with the patient//they will let the patient know this.

## 2022-11-02 NOTE — Telephone Encounter (Signed)
Was advised in ED to discontinue the robaxin. She has appt today for phone visit with Dr Ranell Patrick.

## 2022-11-06 DIAGNOSIS — M542 Cervicalgia: Secondary | ICD-10-CM | POA: Diagnosis not present

## 2022-11-06 DIAGNOSIS — M62838 Other muscle spasm: Secondary | ICD-10-CM | POA: Diagnosis not present

## 2022-11-09 ENCOUNTER — Encounter: Payer: Self-pay | Admitting: Physical Medicine and Rehabilitation

## 2022-11-10 ENCOUNTER — Encounter: Payer: Medicare PPO | Admitting: Physical Medicine and Rehabilitation

## 2022-11-10 DIAGNOSIS — M62838 Other muscle spasm: Secondary | ICD-10-CM

## 2022-11-10 NOTE — Progress Notes (Signed)
Called patient twice and let voicemail.

## 2022-11-11 ENCOUNTER — Encounter (HOSPITAL_BASED_OUTPATIENT_CLINIC_OR_DEPARTMENT_OTHER): Payer: Medicare PPO | Admitting: Physical Medicine and Rehabilitation

## 2022-11-11 ENCOUNTER — Telehealth: Payer: Self-pay | Admitting: *Deleted

## 2022-11-11 DIAGNOSIS — N189 Chronic kidney disease, unspecified: Secondary | ICD-10-CM | POA: Diagnosis not present

## 2022-11-11 DIAGNOSIS — M62838 Other muscle spasm: Secondary | ICD-10-CM

## 2022-11-11 DIAGNOSIS — G8929 Other chronic pain: Secondary | ICD-10-CM | POA: Diagnosis not present

## 2022-11-11 DIAGNOSIS — D803 Selective deficiency of immunoglobulin G [IgG] subclasses: Secondary | ICD-10-CM | POA: Diagnosis not present

## 2022-11-11 DIAGNOSIS — G250 Essential tremor: Secondary | ICD-10-CM | POA: Diagnosis not present

## 2022-11-11 DIAGNOSIS — G2581 Restless legs syndrome: Secondary | ICD-10-CM | POA: Diagnosis not present

## 2022-11-11 DIAGNOSIS — M549 Dorsalgia, unspecified: Secondary | ICD-10-CM | POA: Diagnosis not present

## 2022-11-11 DIAGNOSIS — C3490 Malignant neoplasm of unspecified part of unspecified bronchus or lung: Secondary | ICD-10-CM | POA: Diagnosis not present

## 2022-11-11 DIAGNOSIS — G259 Extrapyramidal and movement disorder, unspecified: Secondary | ICD-10-CM | POA: Diagnosis not present

## 2022-11-11 DIAGNOSIS — M353 Polymyalgia rheumatica: Secondary | ICD-10-CM | POA: Diagnosis not present

## 2022-11-11 MED ORDER — CYCLOBENZAPRINE HCL 5 MG PO TABS: 5.0000 mg | ORAL_TABLET | Freq: Three times a day (TID) | ORAL | 0 refills | Status: DC | PRN

## 2022-11-11 NOTE — Telephone Encounter (Signed)
Patient called to inform she missed her phone visit on yesterday because the number did not come up as .Marland Kitchen

## 2022-11-12 ENCOUNTER — Ambulatory Visit (INDEPENDENT_AMBULATORY_CARE_PROVIDER_SITE_OTHER): Payer: Medicare PPO

## 2022-11-12 VITALS — BP 84/56 | HR 79 | Temp 97.9°F | Resp 16 | Ht 64.0 in | Wt 142.0 lb

## 2022-11-12 DIAGNOSIS — M353 Polymyalgia rheumatica: Secondary | ICD-10-CM | POA: Diagnosis not present

## 2022-11-12 DIAGNOSIS — M2550 Pain in unspecified joint: Secondary | ICD-10-CM | POA: Diagnosis not present

## 2022-11-12 MED ORDER — SODIUM CHLORIDE 0.9 % IV SOLN
1000.0000 mg | Freq: Once | INTRAVENOUS | Status: AC
  Administered 2022-11-12: 1000 mg via INTRAVENOUS
  Filled 2022-11-12: qty 16

## 2022-11-12 MED ORDER — SODIUM CHLORIDE 0.9% FLUSH
10.0000 mL | Freq: Once | INTRAVENOUS | Status: AC | PRN
  Administered 2022-11-12: 10 mL

## 2022-11-12 MED ORDER — HEPARIN SOD (PORK) LOCK FLUSH 100 UNIT/ML IV SOLN
500.0000 [IU] | Freq: Once | INTRAVENOUS | Status: AC | PRN
  Administered 2022-11-12: 500 [IU]
  Filled 2022-11-12: qty 5

## 2022-11-12 NOTE — Progress Notes (Signed)
Diagnosis: Polyarthralgia rheumatica  Provider:  Marshell Garfinkel MD  Procedure: Infusion  IV Type: Port a Cath, IV Location: R Chest  Solumedrol (Methylprednisolone), Dose: 1000 mg  Infusion Start Time: 1046  Infusion Stop Time: [id number]  Post Infusion IV Care: Observation period completed and Port A Cath deaccessed and heparin locked per protocol  Discharge: Condition: Good, Destination: Home . AVS Declined  Performed by:  Binnie Kand, RN

## 2022-11-13 NOTE — Progress Notes (Signed)
Subjective:    Patient ID: Megan Brewer, female    DOB: 1945-10-22, 77 y.o.   MRN: [id number]  HPI 1) Diffuse pain, has been attributed to polymyalgia rheumatica Megan Brewer is a 77 year old woman who presents to establish care for diffuse pain.  -it is present in her wrists, clavicles, left hip -the prednisone helps to control the pain but it only lasts 6 weeks and by the times she could go for the second infusion it was bad.  -she will be seeing her rheumatologist in a few weeks and would be very grateful if he were able to increased the frequency to q6 weeks -has been pretty good  -The Iv prednisone helped a great deal for some time.  -she has f/u with rheumatology -they have tried high doses of prednisone, low does hydrocodone, low doses Tramadol, lidocaine patches. Nothing has been helpful yet -she thinks the pain started after she spent 1 month at [facility] for sepsis and C diff. She left the hospital on June 1st. On June 4th she started with breathing difficulties, shaking, and some pain. It gradually got worse.  -her husband accompanies her today -steroids did not help leg pain -interested in cervical traction device  2) Shortness of breath -breathing without pain for the first time! -had been present July 2022 to July 2023 -she also feels a deflated rubber tire on her chest -she has been told her heart and lungs are fine -the steroids may have helped her to breath better -at the hospital where she was diagnosed she got a massive IV dose of steroids and this cleared up her lungs  3) Muscle spasms: -she has tried flexeril in the past with benefit -she recently tried robaxin and this caused severe symptomatic hypotension requiring her to go to the ED    Pain Inventory Average Pain 5 Pain Right Now 6 My pain is intermittent, constant, sharp, burning, dull, stabbing, tingling, and aching  In the last 24 hours, has pain interfered with the following? General  activity 2 Relation with others 7 Enjoyment of life 10 What TIME of day is your pain at its worst? daytime and evening Sleep (in general) Poor  Pain is worse with: unsure Pain improves with: rest, heat/ice, and medication Relief from Meds: 5    New pt    Family History  Problem Relation Age of Onset   Hypertension Mother    GER disease Mother    Dementia Mother    Osteoporosis Mother    Arthritis Mother    Bipolar disorder Mother    Parkinson's disease Father    Congestive Heart Failure Father    Pneumonia Father    Arthritis Father    Dementia Father    Depression Father    Asthma Sister    Depression Sister    Bipolar disorder Brother    Asthma Daughter    Colon cancer Neg Hx    Esophageal cancer Neg Hx    Stomach cancer Neg Hx    Rectal cancer Neg Hx    Social History   Socioeconomic History   Marital status: Married    Spouse name: Not on file   Number of children: 2   Years of education: Not on file   Highest education level: GED or equivalent  Occupational History   Occupation: retired  Tobacco Use   Smoking status: Never   Smokeless tobacco: Never  Vaping Use   Vaping Use: Never used  Substance and Sexual  Activity   Alcohol use: Not Currently   Drug use: Never   Sexual activity: Not on file  Other Topics Concern   Not on file  Social History Narrative   Diet: None      Caffeine: coffee, tea, sodas less than or 1 daily.      Married, if yes what year: Yes, 1972      Do you live in a house, apartment, assisted living, condo, trailer, ect: Two story house       Pets: None      Current/Past profession: Teach      Exercise: None         Living Will: No   DNR: No   POA/HPOA: No      Functional Status:   Do you have difficulty bathing or dressing yourself? yes   Do you have difficulty preparing food or eating? yes   Do you have difficulty managing your medications? yes   Do you have difficulty managing your finances?yes   Do you  have difficulty affording your medications? No      Right Handed    Lives in a two story home    Social Determinants of Health   Financial Resource Strain: Not on file  Food Insecurity: Not on file  Transportation Needs: Not on file  Physical Activity: Not on file  Stress: Not on file  Social Connections: Not on file   Past Surgical History:  Procedure Laterality Date   ABDOMINAL HYSTERECTOMY     CARPAL TUNNEL RELEASE     CATARACT EXTRACTION Bilateral 2019   CHOLECYSTECTOMY     COLONOSCOPY  2015   [facility]   CT LUNG SCREENING  2018   DENTAL SURGERY  06/17/2021   4 teeth rmoved   DG  BONE DENSITY ([place] [facility])  2018   DIAGNOSTIC MAMMOGRAM  2019   GASTRIC BYPASS     LUNG CANCER SURGERY  02/2010   MULTIPLE TOOTH EXTRACTIONS     PORT A CATH REVISION Right 04/2021   PORT-A-CATH REMOVAL Left 02/07/2021   Procedure: REMOVAL PORT-A-CATH;  Surgeon: Mickeal Skinner, MD;  Location: [facility];  Service: General;  Laterality: Left;  60 MINUTES ROOM 4   SPINAL CORD STIMULATOR IMPLANT     Past Medical History:  Diagnosis Date   Anemia    Arachnoiditis    Bipolar disorder ([place])    Cataract    removed   Chronic post-thoracotomy pain    CKD (chronic kidney disease)    GERD (gastroesophageal reflux disease)    Hypogammaglobulinemia (HCC)    Hypotension    Hypothyroid    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HCC)    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  Opioid Risk Score:   Fall Risk Score:  `1  Depression screen [facility] 2/9     10/27/2022   11:10 AM 06/11/2022   10:09 AM 09/30/2021   11:02 AM 02/04/2021    3:28 PM 09/27/2020   10:57 AM 06/10/2020    2:51 PM 09/25/2019    1:06 PM  Depression screen PHQ 2/9  Decreased Interest 1 3 0  0 0 0 0  Down, Depressed, Hopeless 1  3 0 0 0 0 0  PHQ - 2 Score 2 6 0 0 0 0 0  Altered sleeping  3       Tired, decreased energy  3       Change in appetite  2       Feeling bad or failure about yourself   3       Trouble concentrating  0       Moving slowly or fidgety/restless  3       Suicidal thoughts  3       PHQ-9 Score  23       Difficult doing work/chores  Extremely dIfficult           Review of Systems  Respiratory:  Positive for shortness of breath.   Gastrointestinal:  Positive for constipation and nausea.  Musculoskeletal:  Positive for gait problem.       Spasms  Neurological:  Positive for dizziness, tremors and weakness.  Psychiatric/Behavioral:  Positive for dysphoric mood. The patient is nervous/anxious.   All other systems reviewed and are negative.      Objective:   Physical Exam Gen: no distress, normal appearing HEENT: oral mucosa pink and moist, NCAT Cardio: Reg rate Chest: normal effort, normal rate of breathing Abd: soft, non-distended Ext: no edema Psych: pleasant, normal affect Skin: intact Neuro: Alert and oriented x3. Slow speech MSK: protracted cervical posture    Assessment & Plan:   1) Chronic Pain Syndrome  with diffuse pain secondary to Polymylagia rheumatica -Discussed current symptoms of pain and history of pain.  -Discussed benefits of exercise in reducing pain. -discussed that the only thing that has been helpful was IV steroids. Continue, recommended discussing with rheumatology increasing frequency to q6 weeks.  -discontinue cymbalta 20mg  daily.  -continue ergocalciferol 50,000U  -will send message to her care team (Dr. Nani Ravens PCP, Dr. Percival Spanish cardiologist, Dr. Tana Coast rheumatologist) to discuss doing IV steroid treatment.  -will call her rheumatologist today to discuss Kevzara and Plaquenil.  -discussed her follow-up with rheumatology, who placed her on low dose prednisone 5mg  BID.  -Discussed following foods that may  reduce pain: 1) Ginger (especially studied for arthritis)- reduce leukotriene production to decrease inflammation 2) Blueberries- high in phytonutrients that decrease inflammation 3) Salmon- marine omega-3s reduce joint swelling and pain 4) Pumpkin seeds- reduce inflammation 5) dark chocolate- reduces inflammation 6) turmeric- reduces inflammation 7) tart cherries - reduce pain and stiffness 8) extra virgin olive oil - its compound olecanthal helps to block prostaglandins  9) chili peppers- can be eaten or applied topically via capsaicin 10) mint- helpful for headache, muscle aches, joint pain, and itching 11) garlic- reduces inflammation  Link to further information on diet for chronic pain: [email or web address]    2) Shortness of breath secondary to lung cancer -discussed that it started one year ago -discussed that cardio pulmonary workup has been normal -discussed that her IV steroids were very helpful.  -ordered pulmonary rehab  3) Muscle spasms: -prescribed robaxin  4) Cervicalgia: -.Prescribed Zynex Nexwave, cervical traction device -discussed trigger point injections   5) Movement disorder: -referred to [facility] Movement Disorders clinic -discussed that she cannot do MRIs.   6) Muscle spasms: -flexeril 5mg  TID prescribed -discussed her negative response to robaxin  5 minutes in discussing her negative response to robaxin, discussed trying flexeril 5mg  TID prn

## 2022-11-16 DIAGNOSIS — D803 Selective deficiency of immunoglobulin G [IgG] subclasses: Secondary | ICD-10-CM | POA: Diagnosis not present

## 2022-11-16 DIAGNOSIS — C3490 Malignant neoplasm of unspecified part of unspecified bronchus or lung: Secondary | ICD-10-CM | POA: Diagnosis not present

## 2022-11-16 DIAGNOSIS — M353 Polymyalgia rheumatica: Secondary | ICD-10-CM | POA: Diagnosis not present

## 2022-11-16 DIAGNOSIS — N189 Chronic kidney disease, unspecified: Secondary | ICD-10-CM | POA: Diagnosis not present

## 2022-11-16 DIAGNOSIS — M549 Dorsalgia, unspecified: Secondary | ICD-10-CM | POA: Diagnosis not present

## 2022-11-16 DIAGNOSIS — G8929 Other chronic pain: Secondary | ICD-10-CM | POA: Diagnosis not present

## 2022-11-16 DIAGNOSIS — G250 Essential tremor: Secondary | ICD-10-CM | POA: Diagnosis not present

## 2022-11-16 DIAGNOSIS — G259 Extrapyramidal and movement disorder, unspecified: Secondary | ICD-10-CM | POA: Diagnosis not present

## 2022-11-16 DIAGNOSIS — G2581 Restless legs syndrome: Secondary | ICD-10-CM | POA: Diagnosis not present

## 2022-11-18 ENCOUNTER — Ambulatory Visit: Payer: Medicare PPO | Admitting: Rheumatology

## 2022-11-19 DIAGNOSIS — R310 Gross hematuria: Secondary | ICD-10-CM | POA: Diagnosis not present

## 2022-11-19 DIAGNOSIS — N3021 Other chronic cystitis with hematuria: Secondary | ICD-10-CM | POA: Diagnosis not present

## 2022-11-21 ENCOUNTER — Other Ambulatory Visit: Payer: Self-pay | Admitting: Family Medicine

## 2022-11-21 DIAGNOSIS — R531 Weakness: Secondary | ICD-10-CM

## 2022-11-23 ENCOUNTER — Telehealth: Payer: Self-pay | Admitting: Family Medicine

## 2022-11-23 NOTE — Telephone Encounter (Signed)
Contacted Megan Brewer to schedule their annual wellness visit. Call back at later date: She call me back   Megan Brewer; Care Guide Ambulatory Clinical Support [place] [facility] Direct Dial: [phone]

## 2022-11-24 ENCOUNTER — Emergency Department (HOSPITAL_BASED_OUTPATIENT_CLINIC_OR_DEPARTMENT_OTHER)
Admission: EM | Admit: 2022-11-24 | Discharge: 2022-11-24 | Disposition: A | Discharge status: Home / Self Care | Payer: Medicare PPO | Attending: Emergency Medicine | Admitting: Emergency Medicine

## 2022-11-24 ENCOUNTER — Encounter (HOSPITAL_BASED_OUTPATIENT_CLINIC_OR_DEPARTMENT_OTHER): Payer: Self-pay | Admitting: Urology

## 2022-11-24 ENCOUNTER — Emergency Department (HOSPITAL_BASED_OUTPATIENT_CLINIC_OR_DEPARTMENT_OTHER): Payer: Medicare PPO

## 2022-11-24 ENCOUNTER — Other Ambulatory Visit: Payer: Self-pay

## 2022-11-24 DIAGNOSIS — Y92009 Unspecified place in unspecified non-institutional (private) residence as the place of occurrence of the external cause: Secondary | ICD-10-CM | POA: Insufficient documentation

## 2022-11-24 DIAGNOSIS — S9032XA Contusion of left foot, initial encounter: Secondary | ICD-10-CM | POA: Diagnosis not present

## 2022-11-24 DIAGNOSIS — M79672 Pain in left foot: Secondary | ICD-10-CM

## 2022-11-24 DIAGNOSIS — R600 Localized edema: Secondary | ICD-10-CM | POA: Diagnosis not present

## 2022-11-24 DIAGNOSIS — N189 Chronic kidney disease, unspecified: Secondary | ICD-10-CM | POA: Insufficient documentation

## 2022-11-24 DIAGNOSIS — Z85118 Personal history of other malignant neoplasm of bronchus and lung: Secondary | ICD-10-CM | POA: Diagnosis not present

## 2022-11-24 DIAGNOSIS — S99922A Unspecified injury of left foot, initial encounter: Secondary | ICD-10-CM | POA: Diagnosis present

## 2022-11-24 MED ORDER — HYDROCODONE-ACETAMINOPHEN 5-325 MG PO TABS: 1.0000 | ORAL_TABLET | Freq: Four times a day (QID) | ORAL | 0 refills | Status: DC | PRN

## 2022-11-24 MED ORDER — HYDROCODONE-ACETAMINOPHEN 5-325 MG PO TABS
2.0000 | ORAL_TABLET | Freq: Once | ORAL | Status: AC
  Administered 2022-11-24: 2 via ORAL
  Filled 2022-11-24: qty 2

## 2022-11-24 NOTE — ED Provider Notes (Signed)
[place] [facility] AT [facility] Provider Note   CSN: [id number] Arrival date & time: 11/24/22  8891     History  Chief Complaint  Patient presents with   Foot Injury    Megan Brewer is a 77 y.o. female with past medical history as outlined below presents to the ED complaining of left foot pain.  Patient states that she fell 4 weeks ago and injured her foot, but it is not healing.  Patient reports there is bruising, swelling, and she is now having difficulty bearing weight on her foot.  Patient uses motorized wheelchair at home but reports she has been ambulatory up until this past Saturday when the pain returned and was worse.  Patient has been taking over-the-counter medications without relief of symptoms.  Denies anticoagulation use.  She is unsure what she hit her foot on, but it may have been a chair.  Patient has frequent falls.    Past Medical History:  Diagnosis Date   Anemia    Arachnoiditis    Bipolar disorder    Cataract    removed   Chronic post-thoracotomy pain    CKD (chronic kidney disease)    GERD (gastroesophageal reflux disease)    Hypogammaglobulinemia    Hypotension    Hypothyroid    Immunoglobulin G deficiency    Immunoglobulin subclass deficiency    Lung cancer 2011, 2014   Memory loss    Migraine    Opioid abuse    Osteoporosis    PMR (polymyalgia rheumatica) 03/17/2022   Presence of neurostimulator    Restless legs    Sleep apnea         Home Medications Prior to Admission medications   Medication Sig Start Date End Date Taking? Authorizing Provider  HYDROcodone-acetaminophen (NORCO/VICODIN) 5-325 MG tablet Take 1-2 tablets by mouth every 6 (six) hours as needed for severe pain. 11/24/22  Yes Vollie Brunty R, PA-C  acetaminophen (TYLENOL) 500 MG tablet Take 1,000 mg by mouth as needed for moderate pain.    [provider]  azelastine (ASTELIN) 0.1 % nasal spray Place 2 sprays into both nostrils 2 (two)  times daily. Patient taking differently: Place 2 sprays into both nostrils as needed for rhinitis or allergies. 03/10/21   Hetty Blend, FNP  baclofen (LIORESAL) 10 MG tablet Take 10 mg by mouth daily as needed for muscle spasms. 06/03/20   [provider]  buPROPion (WELLBUTRIN XL) 150 MG 24 hr tablet TAKE ONE (1) TABLET BY MOUTH EACH DAY Patient not taking: Reported on 10/30/2022 07/14/22   Corie Chiquito, PMHNP  CALCIUM PO Take 1 tablet by mouth in the morning and at bedtime.    [provider]  cholecalciferol (VITAMIN D3) 25 MCG (1000 UNIT) tablet Take 1,000 Units by mouth daily.    [provider]  COVID-19 mRNA vaccine [phone] (COMIRNATY) syringe Inject into the muscle. Patient not taking: Reported on 10/30/2022 06/03/22   Judyann Munson, MD  cyclobenzaprine (FLEXERIL) 5 MG tablet Take 1 tablet (5 mg total) by mouth 3 (three) times daily as needed for muscle spasms. 11/11/22   [name], Drema Pry, MD  cycloSPORINE (RESTASIS) 0.05 % ophthalmic emulsion Place 1 drop into both eyes in the morning, at noon, in the evening, and at bedtime.    [provider]  dexlansoprazole (DEXILANT) 60 MG capsule Take 1 capsule (60 mg total) by mouth daily with breakfast. 04/30/22   Esterwood, Amy S, PA-C  famotidine (PEPCID) 20 MG tablet  Take 1 tablet (20 mg total) by mouth at bedtime. 04/30/22   Esterwood, Amy S, PA-C  ferrous sulfate 325 (65 FE) MG tablet Take 650 mg by mouth daily with breakfast.    [provider]  GAMMAGARD 20 GM/200ML SOLN Inject 40 g into the vein every 21 ( twenty-one) days. INFUSE 40G INTRAVENOUSLY EVERY 3 WEEKS 02/27/22   [provider]  heparin lock flush 100 UNIT/ML SOLN injection Inject 500 Units into the vein every 21 ( twenty-one) days. 03/31/21   [provider]  ibuprofen (ADVIL) 200 MG tablet Take 600 mg by mouth as needed for mild pain.    [provider]  influenza vaccine adjuvanted (FLUAD QUADRIVALENT)  0.5 ML injection Inject into the muscle. Patient not taking: Reported on 10/30/2022 06/03/22   Judyann Munson, MD  KEVZARA 200 MG/1. SOAJ Inject 1.14 mLs into the skin every 14 (fourteen) days. 09/09/22   [provider]  levothyroxine (SYNTHROID) 75 MCG tablet TAKE ONE (1) TABLET BY MOUTH EACH DAY BEFORE BREAKFAST ON EMPTY STOMACH Patient taking differently: Take 75 mcg by mouth daily before breakfast. 30 MINUTES BEFORE BREAKFAST ON EMPTY STOMACH. 09/11/22   Sharlene Dory, DO  magnesium oxide (MAG-OX) 400 MG tablet Take 400 mg by mouth daily.     [provider]  methocarbamol (ROBAXIN-750) 750 MG tablet Take 1 tablet (750 mg total) by mouth 4 (four) times daily. 10/27/22   [name], Drema Pry, MD  mupirocin ointment (BACTROBAN) 2 % Apply 1 application. topically 2 (two) times daily. Patient not taking: Reported on 10/30/2022 12/29/21   Sharon Seller, NP  MYRBETRIQ 50 MG TB24 tablet Take 50 mg by mouth daily. 10/07/22   [provider]  potassium chloride (KLOR-CON) 10 MEQ tablet TAKE ONE (1) TABLET BY MOUTH FOUR (4) TIMES DAILY 11/23/22   Wendling, Jilda Roche, DO  pregabalin (LYRICA) 50 MG capsule Take 1 capsule (50 mg total) by mouth 3 (three) times daily. 08/25/22   Sharlene Dory, DO  primidone (MYSOLINE) 50 MG tablet Take 1 tablet (50 mg total) by mouth in the morning and at bedtime. Patient not taking: Reported on 10/30/2022 08/25/22   Sharlene Dory, DO  RSV vaccine recomb adjuvanted (AREXVY) 120 MCG/0.5ML injection Inject into the muscle. Patient not taking: Reported on 10/30/2022 06/11/22   Judyann Munson, MD  sodium chloride, PF, 0.9 % injection Inject 5-10 mLs into the vein as needed (Immunoglobulin). 02/12/21   [provider]  sucralfate (CARAFATE) 1 GM/10ML suspension Take 10 mLs (1 g total) by mouth 4 (four) times daily as needed. 07/13/22   Sharlene Dory, DO  tamsulosin (FLOMAX) 0.4 MG CAPS capsule  Take 1 capsule (0.4 mg total) by mouth daily. 01/23/21   Burnadette Pop, MD  torsemide (DEMADEX) 20 MG tablet Take 1 tablet (20 mg total) by mouth 2 (two) times daily. 09/01/22   Sharlene Dory, DO  triamcinolone cream (KENALOG) 0.1 % Apply 1 application topically 2 (two) times daily as needed (rash).    [provider]  UNABLE TO FIND Take 1 tablet by mouth daily. Med Name: Verdis Frederickson 1 by mouth daily for UTI prevention    [provider]  vitamin B-12 (CYANOCOBALAMIN) 1000 MCG tablet Take 1,000 mcg by mouth daily. 02/14/21   [provider]  Vitamin D, Ergocalciferol, (DRISDOL) 1.25 MG (50000 UNIT) CAPS capsule Take 1 capsule (50,000 Units total) by mouth every 7 (seven) days. Patient taking differently: Take 50,000 Units by mouth every  7 (seven) days. Thursdays 08/27/22   [name], Drema PryKrutika P, MD  ZINC OXIDE, TOPICAL, 10 % CREA Apply 1 application topically 2 (two) times daily as needed (rash). 02/14/21   Glade LloydAlekh, Kshitiz, MD      Allergies    Butorphanol, Erythromycin base, Propoxyphene, Sumatriptan, Tetracycline, Ciprofloxacin, Corticosteroids, Doxycycline, Cefixime, Gabapentin, Isometheptene-dichloral-apap, Ketorolac, Prednisone, Promethazine, and Tape    Review of Systems   Review of Systems  Musculoskeletal:  Positive for arthralgias (left foot pain and swelling) and gait problem (due to pain). Negative for back pain and neck pain.    Physical Exam Updated Vital Signs BP 109/60 (BP Location: Right Arm)   Pulse 83   Temp 98 F (36.7 C) (Oral)   Resp 16   Ht 5\' 4"  (1.626 m)   Wt 64.4 kg   SpO2 100%   BMI 24.37 kg/m  Physical Exam Vitals and nursing note reviewed.  Constitutional:      General: She is not in acute distress.    Appearance: Normal appearance. She is not ill-appearing or diaphoretic.  Cardiovascular:     Rate and Rhythm: Normal rate and regular rhythm.  Pulmonary:     Effort: Pulmonary effort is normal.  Musculoskeletal:     Right foot:  Normal range of motion and normal capillary refill. No swelling or deformity. Normal pulse.     Left foot: Decreased range of motion. Normal capillary refill. Swelling, tenderness and bony tenderness present. No deformity. Normal pulse.     Comments: Left foot with swelling, erythema, ecchymosis to the dorsal surface.  There is exquisite tenderness to light touch and palpation.  She has tenderness to palpation of the plantar surface of the foot as well.  Ecchymosis extends across all digits of left foot.    Neurological:     Mental Status: She is alert. Mental status is at baseline.  Psychiatric:        Mood and Affect: Mood normal.        Behavior: Behavior normal.     ED Results / Procedures / Treatments   Labs (all labs ordered are listed, but only abnormal results are displayed) Labs Reviewed - No data to display  EKG None  Radiology DG Foot Complete Left  Result Date: 11/24/2022 CLINICAL DATA:  Fall, pain EXAM: LEFT FOOT - COMPLETE 3+ VIEW COMPARISON:  07/19/2022 FINDINGS: Osteopenia. There is no evidence of fracture or dislocation. There is no evidence of arthropathy or other focal bone abnormality. Soft tissue edema about the forefoot. IMPRESSION: No fracture or dislocation of the left foot. Soft tissue edema about the forefoot. Electronically Signed   By: Jearld LeschAlex D Bibbey M.D.   On: 11/24/2022 10:09    Procedures Procedures    Medications Ordered in ED Medications  HYDROcodone-acetaminophen (NORCO/VICODIN) 5-325 MG per tablet 2 tablet (2 tablets Oral Given 11/24/22 1034)    ED Course/ Medical Decision Making/ A&P                             Medical Decision Making  This patient presents to the ED with chief complaint(s) of left foot pain secondary to injury with pertinent past medical history of polymyalgia rheumatica, hypothyroidism, osteoporosis, polyarthralgia.  The complaint involves an extensive differential diagnosis and also carries with it a high risk of complications  and morbidity.    The differential diagnosis includes acute bony fracture, dislocation, contusion, osteoarthritis  The initial plan is to obtain x-ray of left foot to  assess for fracture or dislocation  Initial Assessment:   Exam significant for swelling, erythema, ecchymosis to the dorsal surface of the left foot.  She has exquisite tenderness to light touch and palpation.  There is tenderness to palpation of the plantar surface of the foot as well.  Ecchymosis extends across all digits of the left foot.  She has normal range of motion of her left ankle and left digits.  Normal cap refill and sensation.  Independent visualization and interpretation of imaging: I independently visualized the following imaging with scope of interpretation limited to determining acute life threatening conditions related to emergency care: Left foot x-ray, which revealed no evidence of acute dislocation or fracture.  There is soft tissue swelling on the forefoot.  I agree with radiologist interpretation.  Treatment and Reassessment: Will give patient dose of pain medicine in the ED as she appears to be in severe pain, especially during initial exam.  Disposition:   Despite negative x-ray today, still have high suspicion for hairline fracture or occult fracture to the left foot given severity of patient's pain as well as no improvement over the last 4 weeks with healing.  Patient does have history significant for osteoporosis making her a high risk for fractures.  Will send patient home on pain medicine and refer to outpatient orthopedics for follow-up.  Walking boot was considered for patient, however, she is unable to withstand anything touching her foot.  Recommended she continue to not bear weight on her foot until she has proper follow-up.  Patient verbalized her understanding and is in agreement with plan of discharge.  The patient has been appropriately medically screened and/or stabilized in the ED. I have low  suspicion for any other emergent medical condition which would require further screening, evaluation or treatment in the ED or require inpatient management. At time of discharge the patient is hemodynamically stable and in no acute distress. I have discussed work-up results and diagnosis with patient and answered all questions. Patient is agreeable with discharge plan. We discussed strict return precautions for returning to the emergency department and they verbalized understanding.           Final Clinical Impression(s) / ED Diagnoses Final diagnoses:  Contusion of left foot, initial encounter  Left foot pain    Rx / DC Orders ED Discharge Orders          Ordered    HYDROcodone-acetaminophen (NORCO/VICODIN) 5-325 MG tablet  Every 6 hours PRN        11/24/22 [address] R, PA-C 11/24/22 1123    Rolan Bucco, MD 11/24/22 1328

## 2022-11-24 NOTE — Discharge Instructions (Addendum)
Thank you for allowing me to be a part of your care.  Your x-ray today did not show any evidence of a broken bone.  I have sent over a prescription for pain medicine to your pharmacy to take whenever you are having severe pain.  I recommend continuing to take over-the-counter medication such as Tylenol when you are having mild pain.  I have provided referral information for orthopedics.  Please call their office today to schedule a follow-up appointment.

## 2022-11-24 NOTE — ED Triage Notes (Signed)
Left foot injury from fall , bruising and swelling noted, pt states injury occurred 4 weeks ago and is not healing, was able to bear weight on it but reports pain with weight bearing and tender to touch    Uses motorized wheelchair at home but reports has been ambulatory up until Saturday when the pain returned and got worse

## 2022-11-24 NOTE — ED Notes (Signed)
Pt was given coke and saltines to go along with her pain medication.

## 2022-11-25 DIAGNOSIS — M549 Dorsalgia, unspecified: Secondary | ICD-10-CM | POA: Diagnosis not present

## 2022-11-25 DIAGNOSIS — G259 Extrapyramidal and movement disorder, unspecified: Secondary | ICD-10-CM | POA: Diagnosis not present

## 2022-11-25 DIAGNOSIS — M353 Polymyalgia rheumatica: Secondary | ICD-10-CM | POA: Diagnosis not present

## 2022-11-25 DIAGNOSIS — D803 Selective deficiency of immunoglobulin G [IgG] subclasses: Secondary | ICD-10-CM | POA: Diagnosis not present

## 2022-11-25 DIAGNOSIS — G8929 Other chronic pain: Secondary | ICD-10-CM | POA: Diagnosis not present

## 2022-11-25 DIAGNOSIS — C3490 Malignant neoplasm of unspecified part of unspecified bronchus or lung: Secondary | ICD-10-CM | POA: Diagnosis not present

## 2022-11-25 DIAGNOSIS — G2581 Restless legs syndrome: Secondary | ICD-10-CM | POA: Diagnosis not present

## 2022-11-25 DIAGNOSIS — N189 Chronic kidney disease, unspecified: Secondary | ICD-10-CM | POA: Diagnosis not present

## 2022-11-25 DIAGNOSIS — G250 Essential tremor: Secondary | ICD-10-CM | POA: Diagnosis not present

## 2022-12-02 ENCOUNTER — Ambulatory Visit
Admission: RE | Admit: 2022-12-02 | Discharge: 2022-12-02 | Disposition: A | Discharge status: Home / Self Care | Payer: Medicare PPO | Source: Ambulatory Visit | Attending: Student | Admitting: Student

## 2022-12-02 ENCOUNTER — Other Ambulatory Visit: Payer: Self-pay | Admitting: Student

## 2022-12-02 DIAGNOSIS — M79672 Pain in left foot: Secondary | ICD-10-CM | POA: Diagnosis not present

## 2022-12-02 DIAGNOSIS — G259 Extrapyramidal and movement disorder, unspecified: Secondary | ICD-10-CM | POA: Diagnosis not present

## 2022-12-02 DIAGNOSIS — G8929 Other chronic pain: Secondary | ICD-10-CM | POA: Diagnosis not present

## 2022-12-02 DIAGNOSIS — M549 Dorsalgia, unspecified: Secondary | ICD-10-CM | POA: Diagnosis not present

## 2022-12-02 DIAGNOSIS — D803 Selective deficiency of immunoglobulin G [IgG] subclasses: Secondary | ICD-10-CM | POA: Diagnosis not present

## 2022-12-02 DIAGNOSIS — M353 Polymyalgia rheumatica: Secondary | ICD-10-CM | POA: Diagnosis not present

## 2022-12-02 DIAGNOSIS — C3490 Malignant neoplasm of unspecified part of unspecified bronchus or lung: Secondary | ICD-10-CM | POA: Diagnosis not present

## 2022-12-02 DIAGNOSIS — I739 Peripheral vascular disease, unspecified: Secondary | ICD-10-CM | POA: Diagnosis not present

## 2022-12-02 DIAGNOSIS — N189 Chronic kidney disease, unspecified: Secondary | ICD-10-CM | POA: Diagnosis not present

## 2022-12-02 DIAGNOSIS — G2581 Restless legs syndrome: Secondary | ICD-10-CM | POA: Diagnosis not present

## 2022-12-02 DIAGNOSIS — N3020 Other chronic cystitis without hematuria: Secondary | ICD-10-CM | POA: Diagnosis not present

## 2022-12-02 DIAGNOSIS — S9032XA Contusion of left foot, initial encounter: Secondary | ICD-10-CM | POA: Diagnosis not present

## 2022-12-02 DIAGNOSIS — G250 Essential tremor: Secondary | ICD-10-CM | POA: Diagnosis not present

## 2022-12-07 DIAGNOSIS — M62838 Other muscle spasm: Secondary | ICD-10-CM | POA: Diagnosis not present

## 2022-12-07 DIAGNOSIS — M542 Cervicalgia: Secondary | ICD-10-CM | POA: Diagnosis not present

## 2022-12-09 ENCOUNTER — Other Ambulatory Visit: Payer: Self-pay | Admitting: Family Medicine

## 2022-12-09 ENCOUNTER — Ambulatory Visit: Payer: Medicare PPO

## 2022-12-09 DIAGNOSIS — R4182 Altered mental status, unspecified: Secondary | ICD-10-CM | POA: Diagnosis not present

## 2022-12-09 DIAGNOSIS — G2581 Restless legs syndrome: Secondary | ICD-10-CM | POA: Diagnosis not present

## 2022-12-09 DIAGNOSIS — D803 Selective deficiency of immunoglobulin G [IgG] subclasses: Secondary | ICD-10-CM | POA: Diagnosis not present

## 2022-12-09 DIAGNOSIS — N3000 Acute cystitis without hematuria: Secondary | ICD-10-CM | POA: Diagnosis not present

## 2022-12-09 DIAGNOSIS — R000 Tachycardia, unspecified: Secondary | ICD-10-CM | POA: Diagnosis not present

## 2022-12-09 DIAGNOSIS — E039 Hypothyroidism, unspecified: Secondary | ICD-10-CM | POA: Diagnosis not present

## 2022-12-09 DIAGNOSIS — E861 Hypovolemia: Secondary | ICD-10-CM | POA: Diagnosis not present

## 2022-12-09 DIAGNOSIS — N179 Acute kidney failure, unspecified: Secondary | ICD-10-CM | POA: Diagnosis not present

## 2022-12-09 DIAGNOSIS — M7989 Other specified soft tissue disorders: Secondary | ICD-10-CM | POA: Diagnosis not present

## 2022-12-09 DIAGNOSIS — D631 Anemia in chronic kidney disease: Secondary | ICD-10-CM | POA: Diagnosis not present

## 2022-12-09 DIAGNOSIS — R7989 Other specified abnormal findings of blood chemistry: Secondary | ICD-10-CM | POA: Diagnosis not present

## 2022-12-09 DIAGNOSIS — R300 Dysuria: Secondary | ICD-10-CM | POA: Diagnosis not present

## 2022-12-09 DIAGNOSIS — R809 Proteinuria, unspecified: Secondary | ICD-10-CM | POA: Diagnosis not present

## 2022-12-09 DIAGNOSIS — N3289 Other specified disorders of bladder: Secondary | ICD-10-CM | POA: Diagnosis not present

## 2022-12-09 DIAGNOSIS — I950 Idiopathic hypotension: Secondary | ICD-10-CM | POA: Diagnosis not present

## 2022-12-09 DIAGNOSIS — G259 Extrapyramidal and movement disorder, unspecified: Secondary | ICD-10-CM | POA: Diagnosis not present

## 2022-12-09 DIAGNOSIS — R531 Weakness: Secondary | ICD-10-CM | POA: Diagnosis not present

## 2022-12-09 DIAGNOSIS — I959 Hypotension, unspecified: Secondary | ICD-10-CM | POA: Diagnosis not present

## 2022-12-09 DIAGNOSIS — D892 Hypergammaglobulinemia, unspecified: Secondary | ICD-10-CM | POA: Diagnosis not present

## 2022-12-09 DIAGNOSIS — N1832 Chronic kidney disease, stage 3b: Secondary | ICD-10-CM | POA: Diagnosis not present

## 2022-12-09 DIAGNOSIS — I951 Orthostatic hypotension: Secondary | ICD-10-CM | POA: Diagnosis not present

## 2022-12-09 DIAGNOSIS — R0902 Hypoxemia: Secondary | ICD-10-CM | POA: Diagnosis not present

## 2022-12-09 DIAGNOSIS — N261 Atrophy of kidney (terminal): Secondary | ICD-10-CM | POA: Diagnosis not present

## 2022-12-11 ENCOUNTER — Encounter: Payer: Medicare PPO | Admitting: Physical Medicine and Rehabilitation

## 2022-12-16 DIAGNOSIS — S9032XD Contusion of left foot, subsequent encounter: Secondary | ICD-10-CM | POA: Diagnosis not present

## 2022-12-17 ENCOUNTER — Encounter: Payer: Self-pay | Admitting: *Deleted

## 2022-12-17 ENCOUNTER — Telehealth: Payer: Self-pay | Admitting: *Deleted

## 2022-12-17 DIAGNOSIS — N189 Chronic kidney disease, unspecified: Secondary | ICD-10-CM | POA: Diagnosis not present

## 2022-12-17 DIAGNOSIS — D803 Selective deficiency of immunoglobulin G [IgG] subclasses: Secondary | ICD-10-CM | POA: Diagnosis not present

## 2022-12-17 DIAGNOSIS — M353 Polymyalgia rheumatica: Secondary | ICD-10-CM | POA: Diagnosis not present

## 2022-12-17 DIAGNOSIS — G250 Essential tremor: Secondary | ICD-10-CM | POA: Diagnosis not present

## 2022-12-17 DIAGNOSIS — M549 Dorsalgia, unspecified: Secondary | ICD-10-CM | POA: Diagnosis not present

## 2022-12-17 DIAGNOSIS — G259 Extrapyramidal and movement disorder, unspecified: Secondary | ICD-10-CM | POA: Diagnosis not present

## 2022-12-17 DIAGNOSIS — G2581 Restless legs syndrome: Secondary | ICD-10-CM | POA: Diagnosis not present

## 2022-12-17 DIAGNOSIS — C3490 Malignant neoplasm of unspecified part of unspecified bronchus or lung: Secondary | ICD-10-CM | POA: Diagnosis not present

## 2022-12-17 DIAGNOSIS — G8929 Other chronic pain: Secondary | ICD-10-CM | POA: Diagnosis not present

## 2022-12-17 NOTE — Transitions of Care (Post Inpatient/ED Visit) (Signed)
12/17/2022  Name: Megan Brewer MRN: [id number] DOB: 1946/03/25  Today's TOC FU Call Status: Today's TOC FU Call Status:: Successful TOC FU Call Competed TOC FU Call Complete Date: 12/17/22  Transition Care Management Follow-up Telephone Call Date of Discharge: 12/15/22 Discharge Facility: Other ([facility]) Name of Other ([facility]) Discharge Facility: [facility] Type of Discharge: Inpatient Admission Primary Inpatient Discharge Diagnosis:: hypotension/ lower extremit edema/ tachycardia/ (L) BBB How have you been since you were released from the hospital?: Same ("Overall I am okay... but still not great; my blood pressure is still falling low at times-- the automatic cuff does not really reflect low blood pressure- but I can tell it is low.") Any questions or concerns?: Yes Patient Questions/Concerns:: Ongoing sensation of low blood pressure at home: reports self-limiting/ spontaneous resolution; confirms has not experienced syncope; reports does not use assistive devices Patient Questions/Concerns Addressed: Other: (made PCP aware; provided education around fall prevention/ signs/ symptoms low pressure along with corresponding action plan)  Items Reviewed: Did you receive and understand the discharge instructions provided?: Yes (reviewed with patient who verbalizes good understanding of same -- outside hospital records) Medications obtained,verified, and reconciled?: Yes (Medications Reviewed) (Full medication reconciliation/ review completed; no concerns or discrepancies identified; confirmed patient obtained/ is taking all newly Rx'd medications as instructed; self-manages medications and denies questions/ concerns around medications today) Any new allergies since your discharge?: No Dietary orders reviewed?: Yes Type of Diet Ordered:: Regular, "as healthy as possible" Do you have support at home?: Yes People in Home: spouse Name of Support/Comfort Primary Source:  Reports essentially independent in self-care activities; supportive spouse assists as/ if needed/ indicated  Medications Reviewed Today: Medications Reviewed Today     Reviewed by Michaela Corner, RN (Registered Nurse) on 12/17/22 at [phone]  Med List Status: <None>   Medication Order Taking? Sig Documenting Provider Last Dose Status Informant  acetaminophen (TYLENOL) 500 MG tablet [id number]  Take 1,000 mg by mouth as needed for moderate pain. [provider]  Active Self, Pharmacy Records  azelastine (ASTELIN) 0.1 % nasal spray [id number]  Place 2 sprays into both nostrils 2 (two) times daily.  Patient taking differently: Place 2 sprays into both nostrils as needed for rhinitis or allergies.   Hetty Blend, FNP  Active Self, Pharmacy Records  baclofen (LIORESAL) 10 MG tablet [id number]  Take 10 mg by mouth daily as needed for muscle spasms. [provider]  Active Self, Pharmacy Records           Med Note Jomarie Longs, [place]   Tue Feb 04, 2021  5:11 PM)    buPROPion (WELLBUTRIN XL) 150 MG 24 hr tablet [id number]  TAKE ONE (1) TABLET BY MOUTH EACH DAY  Patient not taking: Reported on 10/30/2022   Corie Chiquito, PMHNP  Active Self, Pharmacy Records  CALCIUM PO [id number]  Take 1 tablet by mouth in the morning and at bedtime. [provider]  Active Self, Pharmacy Records  cholecalciferol (VITAMIN D3) 25 MCG (1000 UNIT) tablet [id number]  Take 1,000 Units by mouth daily. [provider]  Active Self, Pharmacy Records  COVID-19 mRNA vaccine [phone] [facility]) syringe [id number]  Inject into the muscle.  Patient not taking: Reported on 10/30/2022   Judyann Munson, MD  Active Self, Pharmacy Records  cyclobenzaprine (FLEXERIL) 5 MG tablet [id number]  Take 1 tablet (5 mg total) by mouth 3 (three) times daily as needed for muscle spasms. Horton Chin, MD  Active   cycloSPORINE (RESTASIS) 0.05 % ophthalmic  emulsion [id number]  Place 1 drop into both eyes in the morning,  at noon, in the evening, and at bedtime. [provider]  Active Self, Pharmacy Records           Med Note Jomarie Longs, [facility]   Tue Feb 04, 2021  5:11 PM)    dexlansoprazole (DEXILANT) 60 MG capsule [id number]  Take 1 capsule (60 mg total) by mouth daily with breakfast. Monica Becton, Amy S, PA-C  Active Self, Pharmacy Records  famotidine (PEPCID) 20 MG tablet [id number]  Take 1 tablet (20 mg total) by mouth at bedtime. Esterwood, Amy S, PA-C  Active Self, Pharmacy Records  ferrous sulfate 325 (65 FE) MG tablet [id number]  Take 650 mg by mouth daily with breakfast. [provider]  Active Self, Pharmacy Records           Med Note Dione Housekeeper, EBONY J   Thu Oct 09, 2021 11:30 AM)    GAMMAGARD 20 GM/200ML SOLN [id number]  Inject 40 g into the vein every 21 ( twenty-one) days. INFUSE 40G INTRAVENOUSLY EVERY 3 WEEKS [provider]  Active Self, Pharmacy Records  heparin lock flush 100 UNIT/ML SOLN injection [id number]  Inject 500 Units into the vein every 21 ( twenty-one) days. [provider]  Active Self, Pharmacy Records  HYDROcodone-acetaminophen (NORCO/VICODIN) 5-325 MG tablet [id number]  Take 1-2 tablets by mouth every 6 (six) hours as needed for severe pain. Clark, Meghan R, PA-C  Active   ibuprofen (ADVIL) 200 MG tablet [id number]  Take 600 mg by mouth as needed for mild pain. [provider]  Active Self, Pharmacy Records  influenza vaccine adjuvanted (FLUAD QUADRIVALENT) 0.5 ML injection [id number]  Inject into the muscle.  Patient not taking: Reported on 10/30/2022   Judyann Munson, MD  Active Self, Pharmacy Records  KEVZARA 200 MG/1. SOAJ [id number]  Inject 1.14 mLs into the skin every 14 (fourteen) days. [provider]  Active Self, Pharmacy Records  levothyroxine (SYNTHROID) 75 MCG tablet [id number]  TAKE ONE (1) TABLET BY MOUTH EACH DAY BEFORE BREAKFAST ON EMPTY STOMACH  Patient taking differently: Take 75 mcg by mouth daily before  breakfast. 30 MINUTES BEFORE BREAKFAST ON EMPTY STOMACH.   Sharlene Dory, DO  Active Self, Pharmacy Records  magnesium oxide (MAG-OX) 400 MG tablet [id number]  Take 400 mg by mouth daily.  [provider]  Active Self, Pharmacy Records  methocarbamol (ROBAXIN-750) 750 MG tablet [id number]  Take 1 tablet (750 mg total) by mouth 4 (four) times daily. Horton Chin, MD  Active Self, Pharmacy Records  mupirocin ointment (BACTROBAN) 2 % [id number]  Apply 1 application. topically 2 (two) times daily.  Patient not taking: Reported on 10/30/2022   Sharon Seller, NP  Active Self, Pharmacy Records  MYRBETRIQ 50 MG TB24 tablet [id number]  Take 50 mg by mouth daily. [provider]  Active Self, Pharmacy Records  potassium chloride (KLOR-CON) 10 MEQ tablet [id number]  TAKE ONE (1) TABLET BY MOUTH FOUR (4) TIMES DAILY Sharlene Dory, DO  Active   pregabalin (LYRICA) 50 MG capsule [id number]  TAKE 1 CAPSULE BY MOUTH THREE TIMES A DAY Sharlene Dory, DO  Active   primidone (MYSOLINE) 50 MG tablet [id number]  Take 1 tablet (50 mg total) by mouth in the morning and at bedtime.  Patient not taking: Reported on 10/30/2022   Sharlene Dory, DO  Active Self, Pharmacy Records  RSV vaccine recomb adjuvanted (AREXVY) 120 MCG/0.5ML injection [id number]  Inject into the muscle.  Patient not taking: Reported on 10/30/2022   Judyann Munson, MD  Active Self, Pharmacy Records  sodium chloride, PF, 0.9 % injection [id number]  Inject 5-10 mLs into the vein as needed (Immunoglobulin). [provider]  Active Self, Pharmacy Records  sucralfate (CARAFATE) 1 GM/10ML suspension [id number]  Take 10 mLs (1 g total) by mouth 4 (four) times daily as needed. Sharlene Dory, DO  Active Self, Pharmacy Records  tamsulosin [facility]) 0.4 MG CAPS capsule [id number]  Take 1 capsule (0.4 mg total) by mouth daily. Burnadette Pop, MD  Active Self, Pharmacy Records  torsemide  [facility]) 20 MG tablet [id number]  Take 1 tablet (20 mg total) by mouth 2 (two) times daily. Sharlene Dory, DO  Active Self, Pharmacy Records  triamcinolone cream (KENALOG) 0.1 % [id number]  Apply 1 application topically 2 (two) times daily as needed (rash). [provider]  Active Self, Pharmacy Records           Med Note Jomarie Longs, [facility]   Tue Feb 04, 2021  5:18 PM)    UNABLE TO FIND [id number]  Take 1 tablet by mouth daily. Med Name: Verdis Frederickson 1 by mouth daily for UTI prevention [provider]  Active Self, Pharmacy Records  vitamin B-12 (CYANOCOBALAMIN) 1000 MCG tablet [id number]  Take 1,000 mcg by mouth daily. [provider]  Active Self, Pharmacy Records  Vitamin D, Ergocalciferol, (DRISDOL) 1.25 MG (50000 UNIT) CAPS capsule [id number]  Take 1 capsule (50,000 Units total) by mouth every 7 (seven) days.  Patient taking differently: Take 50,000 Units by mouth every 7 (seven) days. Thursdays   Horton Chin, MD  Active Self, Pharmacy Records  ZINC OXIDE, TOPICAL, 10 % CREA [id number]  Apply 1 application topically 2 (two) times daily as needed (rash). Glade Lloyd, MD  Active Self, Pharmacy Records            Home Care and Equipment/Supplies: Were Home Health Services Ordered?: Yes [facility] Agency[facility]- PT/ OT/ RN Has Agency set up a time to come to your home?: Yes First Home Health Visit Date: 12/17/22 Any new equipment or medical supplies ordered?: No  Functional Questionnaire: Do you need assistance with bathing/showering or dressing?: No Do you need assistance with meal preparation?: No Do you need assistance with eating?: No Do you have difficulty maintaining continence: No Do you need assistance with getting out of bed/getting out of a chair/moving?: No Do you have difficulty managing or taking your medications?: No (husband assists as needed with medications-- full med reconciliation today: advised patient to discuss  medication list with PCP, and to have medication list cleaned up at time of office visit 12/22/22)  Follow up appointments reviewed: PCP Follow-up appointment confirmed?: Yes Date of PCP follow-up appointment?: 12/22/22 Follow-up Provider: [facility]CP- [facility] Follow-up appointment confirmed?: NA Do you need transportation to your follow-up appointment?: No Do you understand care options if your condition(s) worsen?: Yes-patient verbalized understanding  SDOH Interventions Today    Flowsheet Row Most Recent Value  SDOH Interventions   Food Insecurity Interventions Intervention Not Indicated  Transportation Interventions Intervention Not Indicated      TOC Interventions Today    Flowsheet Row Most Recent Value  TOC Interventions   TOC Interventions Discussed/Reviewed TOC Interventions Discussed  [provided my direct contact information should questions/ concerns/ needs arise post-TOC call, prior to RN CM telephone visit 12/31/22]      Interventions Today    Flowsheet Row Most Recent Value  Chronic Disease  Chronic disease during today's visit Other  [orthostatic hypotension]  General Interventions   General Interventions Discussed/Reviewed General Interventions Discussed, Doctor Visits, Referral to Nurse, Communication with, Durable Medical Equipment (DME)  [scheduled with RN CM Care Coordinator for follow up call 12/31/22]  Doctor Visits Discussed/Reviewed Doctor Visits Discussed, PCP  Durable Medical Equipment (DME) Other  [reports she does not use assistive devices: "they get in my way and make me feel like I am going to fall more than I do without them:" Fall prevention education provided]  PCP/Specialist Visits Compliance with follow-up visit  Communication with PCP/Specialists, RN  Education Interventions   Education Provided Provided Education  Provided Verbal Education On Medication, Other  [signs/ symptoms hypotension with corresponding action plan]   Nutrition Interventions   Nutrition Discussed/Reviewed Nutrition Discussed  Pharmacy Interventions   Pharmacy Dicussed/Reviewed Pharmacy Topics Discussed  [Full medication review with updating medication list in EHR per patient report]  Safety Interventions   Safety Discussed/Reviewed Safety Discussed, Fall Risk      Caryl Pina, RN, BSN, CCRN Alumnus RN CM Care Coordination/ Transition of Care- [facility] Care Management [phone]: direct office

## 2022-12-18 ENCOUNTER — Telehealth: Payer: Self-pay | Admitting: Family Medicine

## 2022-12-18 DIAGNOSIS — G250 Essential tremor: Secondary | ICD-10-CM | POA: Diagnosis not present

## 2022-12-18 DIAGNOSIS — G8929 Other chronic pain: Secondary | ICD-10-CM | POA: Diagnosis not present

## 2022-12-18 DIAGNOSIS — C3490 Malignant neoplasm of unspecified part of unspecified bronchus or lung: Secondary | ICD-10-CM | POA: Diagnosis not present

## 2022-12-18 DIAGNOSIS — N189 Chronic kidney disease, unspecified: Secondary | ICD-10-CM | POA: Diagnosis not present

## 2022-12-18 DIAGNOSIS — M549 Dorsalgia, unspecified: Secondary | ICD-10-CM | POA: Diagnosis not present

## 2022-12-18 DIAGNOSIS — G2581 Restless legs syndrome: Secondary | ICD-10-CM | POA: Diagnosis not present

## 2022-12-18 DIAGNOSIS — M353 Polymyalgia rheumatica: Secondary | ICD-10-CM | POA: Diagnosis not present

## 2022-12-18 DIAGNOSIS — D803 Selective deficiency of immunoglobulin G [IgG] subclasses: Secondary | ICD-10-CM | POA: Diagnosis not present

## 2022-12-18 DIAGNOSIS — G259 Extrapyramidal and movement disorder, unspecified: Secondary | ICD-10-CM | POA: Diagnosis not present

## 2022-12-18 NOTE — Telephone Encounter (Signed)
OK 

## 2022-12-18 NOTE — Telephone Encounter (Signed)
Caller/Agency: Francie Massing) Callback Number: [phone] Requesting OT/PT/Skilled Nursing/Social Work/Speech Therapy: PT Frequency: 1 w 1, 2 w 1, 1 w 3

## 2022-12-21 DIAGNOSIS — G2581 Restless legs syndrome: Secondary | ICD-10-CM | POA: Diagnosis not present

## 2022-12-21 DIAGNOSIS — G8929 Other chronic pain: Secondary | ICD-10-CM | POA: Diagnosis not present

## 2022-12-21 DIAGNOSIS — M549 Dorsalgia, unspecified: Secondary | ICD-10-CM | POA: Diagnosis not present

## 2022-12-21 DIAGNOSIS — D803 Selective deficiency of immunoglobulin G [IgG] subclasses: Secondary | ICD-10-CM | POA: Diagnosis not present

## 2022-12-21 DIAGNOSIS — N189 Chronic kidney disease, unspecified: Secondary | ICD-10-CM | POA: Diagnosis not present

## 2022-12-21 DIAGNOSIS — M353 Polymyalgia rheumatica: Secondary | ICD-10-CM | POA: Diagnosis not present

## 2022-12-21 DIAGNOSIS — C3490 Malignant neoplasm of unspecified part of unspecified bronchus or lung: Secondary | ICD-10-CM | POA: Diagnosis not present

## 2022-12-21 DIAGNOSIS — G250 Essential tremor: Secondary | ICD-10-CM | POA: Diagnosis not present

## 2022-12-21 DIAGNOSIS — G259 Extrapyramidal and movement disorder, unspecified: Secondary | ICD-10-CM | POA: Diagnosis not present

## 2022-12-21 NOTE — Telephone Encounter (Signed)
HHRN informed of verbal ok per PCP 

## 2022-12-22 ENCOUNTER — Inpatient Hospital Stay (HOSPITAL_COMMUNITY)
Admission: EM | Admit: 2022-12-22 | Discharge: 2022-12-24 | DRG: 312 | Disposition: A | Discharge status: Home Health | Payer: Medicare PPO | Attending: Internal Medicine | Admitting: Internal Medicine

## 2022-12-22 ENCOUNTER — Encounter: Payer: Medicare PPO | Admitting: Physical Medicine and Rehabilitation

## 2022-12-22 ENCOUNTER — Encounter (HOSPITAL_COMMUNITY): Payer: Self-pay

## 2022-12-22 ENCOUNTER — Other Ambulatory Visit: Payer: Self-pay

## 2022-12-22 ENCOUNTER — Ambulatory Visit: Payer: Medicare PPO | Admitting: Family Medicine

## 2022-12-22 DIAGNOSIS — Z90710 Acquired absence of both cervix and uterus: Secondary | ICD-10-CM

## 2022-12-22 DIAGNOSIS — F319 Bipolar disorder, unspecified: Secondary | ICD-10-CM | POA: Diagnosis present

## 2022-12-22 DIAGNOSIS — G894 Chronic pain syndrome: Secondary | ICD-10-CM | POA: Diagnosis present

## 2022-12-22 DIAGNOSIS — N179 Acute kidney failure, unspecified: Secondary | ICD-10-CM | POA: Diagnosis present

## 2022-12-22 DIAGNOSIS — I951 Orthostatic hypotension: Principal | ICD-10-CM | POA: Diagnosis present

## 2022-12-22 DIAGNOSIS — N390 Urinary tract infection, site not specified: Secondary | ICD-10-CM | POA: Diagnosis present

## 2022-12-22 DIAGNOSIS — Z85118 Personal history of other malignant neoplasm of bronchus and lung: Secondary | ICD-10-CM | POA: Diagnosis not present

## 2022-12-22 DIAGNOSIS — Z825 Family history of asthma and other chronic lower respiratory diseases: Secondary | ICD-10-CM

## 2022-12-22 DIAGNOSIS — E876 Hypokalemia: Secondary | ICD-10-CM | POA: Diagnosis present

## 2022-12-22 DIAGNOSIS — R55 Syncope and collapse: Secondary | ICD-10-CM | POA: Diagnosis present

## 2022-12-22 DIAGNOSIS — Z79899 Other long term (current) drug therapy: Secondary | ICD-10-CM

## 2022-12-22 DIAGNOSIS — I5032 Chronic diastolic (congestive) heart failure: Secondary | ICD-10-CM | POA: Diagnosis present

## 2022-12-22 DIAGNOSIS — E785 Hyperlipidemia, unspecified: Secondary | ICD-10-CM | POA: Diagnosis present

## 2022-12-22 DIAGNOSIS — K219 Gastro-esophageal reflux disease without esophagitis: Secondary | ICD-10-CM | POA: Diagnosis present

## 2022-12-22 DIAGNOSIS — E860 Dehydration: Secondary | ICD-10-CM | POA: Diagnosis present

## 2022-12-22 DIAGNOSIS — M353 Polymyalgia rheumatica: Secondary | ICD-10-CM | POA: Diagnosis present

## 2022-12-22 DIAGNOSIS — Z883 Allergy status to other anti-infective agents status: Secondary | ICD-10-CM

## 2022-12-22 DIAGNOSIS — Z8249 Family history of ischemic heart disease and other diseases of the circulatory system: Secondary | ICD-10-CM

## 2022-12-22 DIAGNOSIS — Z79890 Hormone replacement therapy (postmenopausal): Secondary | ICD-10-CM | POA: Diagnosis not present

## 2022-12-22 DIAGNOSIS — M810 Age-related osteoporosis without current pathological fracture: Secondary | ICD-10-CM | POA: Diagnosis present

## 2022-12-22 DIAGNOSIS — I959 Hypotension, unspecified: Secondary | ICD-10-CM

## 2022-12-22 DIAGNOSIS — Z9884 Bariatric surgery status: Secondary | ICD-10-CM | POA: Diagnosis not present

## 2022-12-22 DIAGNOSIS — Z820 Family history of epilepsy and other diseases of the nervous system: Secondary | ICD-10-CM

## 2022-12-22 DIAGNOSIS — N1831 Chronic kidney disease, stage 3a: Secondary | ICD-10-CM | POA: Diagnosis present

## 2022-12-22 DIAGNOSIS — G2581 Restless legs syndrome: Secondary | ICD-10-CM | POA: Diagnosis present

## 2022-12-22 DIAGNOSIS — I130 Hypertensive heart and chronic kidney disease with heart failure and stage 1 through stage 4 chronic kidney disease, or unspecified chronic kidney disease: Secondary | ICD-10-CM | POA: Diagnosis present

## 2022-12-22 DIAGNOSIS — Z881 Allergy status to other antibiotic agents status: Secondary | ICD-10-CM

## 2022-12-22 DIAGNOSIS — Z818 Family history of other mental and behavioral disorders: Secondary | ICD-10-CM | POA: Diagnosis not present

## 2022-12-22 DIAGNOSIS — Z1629 Resistance to other single specified antibiotic: Secondary | ICD-10-CM | POA: Diagnosis present

## 2022-12-22 DIAGNOSIS — E039 Hypothyroidism, unspecified: Secondary | ICD-10-CM | POA: Diagnosis present

## 2022-12-22 DIAGNOSIS — Z888 Allergy status to other drugs, medicaments and biological substances status: Secondary | ICD-10-CM

## 2022-12-22 DIAGNOSIS — B964 Proteus (mirabilis) (morganii) as the cause of diseases classified elsewhere: Secondary | ICD-10-CM | POA: Diagnosis present

## 2022-12-22 LAB — URINALYSIS, W/ REFLEX TO CULTURE (INFECTION SUSPECTED)
Bilirubin Urine: NEGATIVE
Glucose, UA: NEGATIVE mg/dL
Ketones, ur: NEGATIVE mg/dL
Nitrite: POSITIVE — AB
Protein, ur: NEGATIVE mg/dL
Specific Gravity, Urine: 1.006 (ref 1.005–1.030)
WBC, UA: >50 WBC/hpf (ref 0–5)
pH: 7 (ref 5.0–8.0)

## 2022-12-22 LAB — BASIC METABOLIC PANEL
Anion gap: 10 (ref 5–15)
BUN: 39 mg/dL — ABNORMAL HIGH (ref 8–23)
CO2: 28 mmol/L (ref 22–32)
Calcium: 9.2 mg/dL (ref 8.9–10.3)
Chloride: 98 mmol/L (ref 98–111)
Creatinine, Ser: 1.9 mg/dL — ABNORMAL HIGH (ref 0.44–1.00)
GFR, Estimated: 27 mL/min — ABNORMAL LOW (ref >60)
Glucose, Bld: 92 mg/dL (ref 70–99)
Potassium: 3.5 mmol/L (ref 3.5–5.1)
Sodium: 136 mmol/L (ref 135–145)

## 2022-12-22 LAB — HEPATIC FUNCTION PANEL
ALT: 45 U/L — ABNORMAL HIGH (ref 0–44)
AST: 49 U/L — ABNORMAL HIGH (ref 15–41)
Albumin: 3.5 g/dL (ref 3.5–5.0)
Alkaline Phosphatase: 60 U/L (ref 38–126)
Bilirubin, Direct: <0.1 mg/dL (ref 0.0–0.2)
Total Bilirubin: 0.7 mg/dL (ref 0.3–1.2)
Total Protein: 7.1 g/dL (ref 6.5–8.1)

## 2022-12-22 LAB — CBC
HCT: 39.9 % (ref 36.0–46.0)
Hemoglobin: 12.6 g/dL (ref 12.0–15.0)
MCH: 29.5 pg (ref 26.0–34.0)
MCHC: 31.6 g/dL (ref 30.0–36.0)
MCV: 93.4 fL (ref 80.0–100.0)
Platelets: 206 10*3/uL (ref 150–400)
RBC: 4.27 MIL/uL (ref 3.87–5.11)
RDW: 14.7 % (ref 11.5–15.5)
WBC: 6.6 10*3/uL (ref 4.0–10.5)
nRBC: 0 % (ref 0.0–0.2)

## 2022-12-22 LAB — MAGNESIUM: Magnesium: 1.8 mg/dL (ref 1.7–2.4)

## 2022-12-22 MED ORDER — HEPARIN SODIUM (PORCINE) 5000 UNIT/ML IJ SOLN
5000.0000 [IU] | Freq: Two times a day (BID) | INTRAMUSCULAR | Status: DC
  Administered 2022-12-22 – 2022-12-24 (×4): 5000 [IU] via SUBCUTANEOUS
  Filled 2022-12-22 (×4): qty 1

## 2022-12-22 MED ORDER — MIRABEGRON ER 25 MG PO TB24
25.0000 mg | ORAL_TABLET | Freq: Every day | ORAL | Status: DC
  Administered 2022-12-23 – 2022-12-24 (×2): 25 mg via ORAL
  Filled 2022-12-22 (×2): qty 1

## 2022-12-22 MED ORDER — ACETAMINOPHEN 500 MG PO TABS
1000.0000 mg | ORAL_TABLET | Freq: Three times a day (TID) | ORAL | Status: DC | PRN
  Administered 2022-12-24: 1000 mg via ORAL
  Filled 2022-12-22: qty 2

## 2022-12-22 MED ORDER — ROPINIROLE HCL 1 MG PO TABS
0.5000 mg | ORAL_TABLET | Freq: Three times a day (TID) | ORAL | Status: DC
  Administered 2022-12-22 – 2022-12-24 (×6): 0.5 mg via ORAL
  Filled 2022-12-22 (×6): qty 1

## 2022-12-22 MED ORDER — SODIUM CHLORIDE 0.9% FLUSH: 10.0000 mL | INTRAVENOUS | Status: DC | PRN

## 2022-12-22 MED ORDER — PANTOPRAZOLE SODIUM 40 MG PO TBEC
40.0000 mg | DELAYED_RELEASE_TABLET | Freq: Every day | ORAL | Status: DC
  Administered 2022-12-23 – 2022-12-24 (×2): 40 mg via ORAL
  Filled 2022-12-22 (×2): qty 1

## 2022-12-22 MED ORDER — SODIUM CHLORIDE 0.9% FLUSH
10.0000 mL | Freq: Two times a day (BID) | INTRAVENOUS | Status: DC
  Administered 2022-12-22 – 2022-12-24 (×2): 10 mL

## 2022-12-22 MED ORDER — BACLOFEN 10 MG PO TABS: 10.0000 mg | ORAL_TABLET | Freq: Every day | ORAL | Status: DC | PRN

## 2022-12-22 MED ORDER — FERROUS SULFATE 325 (65 FE) MG PO TABS
650.0000 mg | ORAL_TABLET | Freq: Every day | ORAL | Status: DC
  Administered 2022-12-23 – 2022-12-24 (×2): 650 mg via ORAL
  Filled 2022-12-22 (×2): qty 2

## 2022-12-22 MED ORDER — ONDANSETRON HCL 4 MG PO TABS
4.0000 mg | ORAL_TABLET | Freq: Three times a day (TID) | ORAL | Status: DC | PRN
  Administered 2022-12-24: 4 mg via ORAL
  Filled 2022-12-22: qty 1

## 2022-12-22 MED ORDER — TORSEMIDE 20 MG PO TABS
20.0000 mg | ORAL_TABLET | Freq: Every day | ORAL | Status: DC
  Filled 2022-12-22: qty 1

## 2022-12-22 MED ORDER — CYCLOSPORINE 0.05 % OP EMUL
1.0000 [drp] | Freq: Two times a day (BID) | OPHTHALMIC | Status: DC
  Administered 2022-12-22 – 2022-12-24 (×4): 1 [drp] via OPHTHALMIC
  Filled 2022-12-22 (×5): qty 30

## 2022-12-22 MED ORDER — MIDODRINE HCL 5 MG PO TABS
5.0000 mg | ORAL_TABLET | Freq: Three times a day (TID) | ORAL | Status: DC
  Administered 2022-12-23 (×2): 5 mg via ORAL
  Filled 2022-12-22 (×2): qty 1

## 2022-12-22 MED ORDER — SODIUM CHLORIDE 0.9 % IV BOLUS
500.0000 mL | Freq: Once | INTRAVENOUS | Status: AC
  Administered 2022-12-22: 500 mL via INTRAVENOUS

## 2022-12-22 MED ORDER — CHLORHEXIDINE GLUCONATE CLOTH 2 % EX PADS
6.0000 | MEDICATED_PAD | Freq: Every day | CUTANEOUS | Status: DC
  Administered 2022-12-22 – 2022-12-24 (×3): 6 via TOPICAL

## 2022-12-22 MED ORDER — MAGNESIUM OXIDE 400 MG PO TABS: 400.0000 mg | ORAL_TABLET | Freq: Every day | ORAL | Status: DC

## 2022-12-22 MED ORDER — SUCRALFATE 1 GM/10ML PO SUSP: 1.0000 g | Freq: Four times a day (QID) | ORAL | Status: DC | PRN

## 2022-12-22 MED ORDER — MIRABEGRON ER 25 MG PO TB24: 50.0000 mg | ORAL_TABLET | Freq: Every day | ORAL | Status: DC

## 2022-12-22 MED ORDER — SODIUM CHLORIDE 0.9 % IV SOLN: INTRAVENOUS | Status: AC

## 2022-12-22 MED ORDER — MAGNESIUM OXIDE -MG SUPPLEMENT 400 (240 MG) MG PO TABS
400.0000 mg | ORAL_TABLET | Freq: Every day | ORAL | Status: DC
  Administered 2022-12-23 – 2022-12-24 (×2): 400 mg via ORAL
  Filled 2022-12-22 (×2): qty 1

## 2022-12-22 MED ORDER — LEVOTHYROXINE SODIUM 50 MCG PO TABS
75.0000 ug | ORAL_TABLET | Freq: Every day | ORAL | Status: DC
  Administered 2022-12-23 – 2022-12-24 (×2): 75 ug via ORAL
  Filled 2022-12-22 (×2): qty 1

## 2022-12-22 MED ORDER — TAMSULOSIN HCL 0.4 MG PO CAPS
0.4000 mg | ORAL_CAPSULE | Freq: Every day | ORAL | Status: DC
  Administered 2022-12-23 – 2022-12-24 (×2): 0.4 mg via ORAL
  Filled 2022-12-22 (×2): qty 1

## 2022-12-22 MED ORDER — FAMOTIDINE 20 MG PO TABS
10.0000 mg | ORAL_TABLET | Freq: Every day | ORAL | Status: DC
  Administered 2022-12-22 – 2022-12-23 (×2): 10 mg via ORAL
  Filled 2022-12-22 (×2): qty 1

## 2022-12-22 NOTE — Progress Notes (Signed)
Patient was advised to go to the ED for symptomatic hypotension

## 2022-12-22 NOTE — ED Provider Notes (Signed)
[place] EMERGENCY DEPARTMENT AT [facility] Provider Note   CSN: [id number] Arrival date & time: 12/22/22  4098     History  Chief Complaint  Patient presents with   Hypotension    Megan Brewer is a 77 y.o. female.  HPI Patient presents with dizziness and hypotension.  States she has been feeling lightheaded when she stands up.  History of same with a persistent basis.  Has had extensive workup in the past.  I reviewed notes from recent admission at [facility] for the same.  Reportedly at appointment today and found a blood pressure of 63/37.  Upon arrival however patient's blood pressure is 123/65.  He is on midodrine and states she has taken it today.  States she does drink a lot of fluids.  However is also on Synthroid.  Normal sinus rhythm with a   Past Medical History:  Diagnosis Date   Anemia    Arachnoiditis    Bipolar disorder (HCC)    Cataract    removed   Chronic post-thoracotomy pain    CKD (chronic kidney disease)    GERD (gastroesophageal reflux disease)    Hypogammaglobulinemia (HCC)    Hypotension    Hypothyroid    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HCC)    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HCC)    Lung cancer (HCC) 2011, 2014   Memory loss    Migraine    Opioid abuse (HCC)    Osteoporosis    PMR (polymyalgia rheumatica) (HCC) 03/17/2022   Presence of neurostimulator    Restless legs    Sleep apnea     Home Medications Prior to Admission medications   Medication Sig Start Date End Date Taking? Authorizing Provider  acetaminophen (TYLENOL) 500 MG tablet Take 1,000 mg by mouth as needed for moderate pain.    [provider]  azelastine (ASTELIN) 0.1 % nasal spray Place 2 sprays into both nostrils 2 (two) times daily. Patient taking differently: Place 2 sprays into both nostrils as needed for rhinitis or allergies. 03/10/21   Hetty Blend, FNP  baclofen (LIORESAL) 10 MG tablet Take 10 mg by mouth daily as needed for muscle spasms.  06/03/20   [provider]  Buprenorphine HCl (BELBUCA) 750 MCG FILM Place 750 mcg inside cheek.    Sharlene Dory, DO  buPROPion (WELLBUTRIN XL) 150 MG 24 hr tablet TAKE ONE (1) TABLET BY MOUTH EACH DAY Patient not taking: Reported on 10/30/2022 07/14/22   Corie Chiquito, PMHNP  CALCIUM PO Take 1 tablet by mouth in the morning and at bedtime.    [provider]  cholecalciferol (VITAMIN D3) 25 MCG (1000 UNIT) tablet Take 1,000 Units by mouth daily.    [provider]  COVID-19 mRNA vaccine [phone] (COMIRNATY) syringe Inject into the muscle. Patient not taking: Reported on 10/30/2022 06/03/22   Judyann Munson, MD  cyclobenzaprine (FLEXERIL) 5 MG tablet Take 1 tablet (5 mg total) by mouth 3 (three) times daily as needed for muscle spasms. Patient not taking: Reported on 12/17/2022 11/11/22   Horton Chin, MD  cycloSPORINE (RESTASIS) 0.05 % ophthalmic emulsion Place 1 drop into both eyes in the morning, at noon, in the evening, and at bedtime.    [provider]  dexlansoprazole (DEXILANT) 60 MG capsule Take 1 capsule (60 mg total) by mouth daily with breakfast. 04/30/22   Esterwood, Amy S, PA-C  famotidine (PEPCID) 20 MG tablet Take 1 tablet (20 mg total) by mouth at bedtime. 04/30/22   Esterwood,  Amy S, PA-C  ferrous sulfate 325 (65 FE) MG tablet Take 650 mg by mouth daily with breakfast.    [provider]  GAMMAGARD 20 GM/200ML SOLN Inject 40 g into the vein every 21 ( twenty-one) days. INFUSE 40G INTRAVENOUSLY EVERY 3 WEEKS 02/27/22   [provider]  heparin lock flush 100 UNIT/ML SOLN injection Inject 500 Units into the vein every 21 ( twenty-one) days. 03/31/21   [provider]  HYDROcodone-acetaminophen (NORCO/VICODIN) 5-325 MG tablet Take 1-2 tablets by mouth every 6 (six) hours as needed for severe pain. Patient not taking: Reported on 12/17/2022 11/24/22   Melton Alar R, PA-C  ibuprofen (ADVIL) 200 MG tablet Take 600  mg by mouth as needed for mild pain.    [provider]  influenza vaccine adjuvanted (FLUAD QUADRIVALENT) 0.5 ML injection Inject into the muscle. Patient not taking: Reported on 10/30/2022 06/03/22   Judyann Munson, MD  KEVZARA 200 MG/1. SOAJ Inject 1.14 mLs into the skin every 14 (fourteen) days. 09/09/22   [provider]  levothyroxine (SYNTHROID) 75 MCG tablet TAKE ONE (1) TABLET BY MOUTH EACH DAY BEFORE BREAKFAST ON EMPTY STOMACH Patient taking differently: Take 75 mcg by mouth daily before breakfast. 30 MINUTES BEFORE BREAKFAST ON EMPTY STOMACH. 09/11/22   Sharlene Dory, DO  magnesium oxide (MAG-OX) 400 MG tablet Take 400 mg by mouth daily.     [provider]  methocarbamol (ROBAXIN-750) 750 MG tablet Take 1 tablet (750 mg total) by mouth 4 (four) times daily. Patient not taking: Reported on 12/17/2022 10/27/22   Horton Chin, MD  midodrine (PROAMATINE) 5 MG tablet Take 5 mg by mouth 3 (three) times daily with meals.    Sharlene Dory, DO  mupirocin ointment (BACTROBAN) 2 % Apply 1 application. topically 2 (two) times daily. Patient not taking: Reported on 10/30/2022 12/29/21   Sharon Seller, NP  MYRBETRIQ 50 MG TB24 tablet Take 50 mg by mouth daily. 10/07/22   [provider]  ondansetron (ZOFRAN) 8 MG tablet Take by mouth every 8 (eight) hours as needed for nausea or vomiting.    Sharlene Dory, DO  potassium chloride (KLOR-CON) 10 MEQ tablet TAKE ONE (1) TABLET BY MOUTH FOUR (4) TIMES DAILY Patient not taking: Reported on 12/17/2022 11/23/22   Sharlene Dory, DO  pregabalin (LYRICA) 50 MG capsule TAKE 1 CAPSULE BY MOUTH THREE TIMES A DAY Patient not taking: Reported on 12/17/2022 12/09/22   Sharlene Dory, DO  primidone (MYSOLINE) 50 MG tablet Take 1 tablet (50 mg total) by mouth in the morning and at bedtime. Patient not taking: Reported on 10/30/2022 08/25/22   Sharlene Dory, DO   pyridoxine (B-6) 100 MG tablet Take 100 mg by mouth daily.    Sharlene Dory, DO  rOPINIRole (REQUIP) 0.5 MG tablet Take 0.5 mg by mouth 3 (three) times daily.    Sharlene Dory, DO  RSV vaccine recomb adjuvanted (AREXVY) 120 MCG/0.5ML injection Inject into the muscle. Patient not taking: Reported on 10/30/2022 06/11/22   Judyann Munson, MD  sodium chloride, PF, 0.9 % injection Inject 5-10 mLs into the vein as needed (Immunoglobulin). 02/12/21   [provider]  sucralfate (CARAFATE) 1 GM/10ML suspension Take 10 mLs (1 g total) by mouth 4 (four) times daily as needed. 07/13/22   Sharlene Dory, DO  tamsulosin (FLOMAX) 0.4 MG CAPS capsule Take 1 capsule (0.4 mg total) by mouth daily. 01/23/21   Burnadette Pop, MD  torsemide (DEMADEX) 20 MG tablet Take 1 tablet (20 mg total) by mouth 2 (two) times daily. 09/01/22   Sharlene Dory, DO  triamcinolone cream (KENALOG) 0.1 % Apply 1 application topically 2 (two) times daily as needed (rash). Patient not taking: Reported on 12/17/2022    [provider]  UNABLE TO FIND Take 1 tablet by mouth daily. Med Name: Verdis Frederickson 1 by mouth daily for UTI prevention Patient not taking: Reported on 12/17/2022    [provider]  vitamin B-12 (CYANOCOBALAMIN) 1000 MCG tablet Take 1,000 mcg by mouth daily. 02/14/21   [provider]  Vitamin D, Ergocalciferol, (DRISDOL) 1.25 MG (50000 UNIT) CAPS capsule Take 1 capsule (50,000 Units total) by mouth every 7 (seven) days. Patient taking differently: Take 50,000 Units by mouth every 7 (seven) days. Thursdays 08/27/22   [name], Drema Pry, MD  ZINC OXIDE, TOPICAL, 10 % CREA Apply 1 application topically 2 (two) times daily as needed (rash). Patient not taking: Reported on 12/17/2022 02/14/21   Glade Lloyd, MD      Allergies    Butorphanol, Erythromycin base, Propoxyphene, Sumatriptan, Tetracycline, Ciprofloxacin, Corticosteroids, Doxycycline, Cefixime, Gabapentin,  Isometheptene-dichloral-apap, Ketorolac, Prednisone, Promethazine, and Tape    Review of Systems   Review of Systems  Physical Exam Updated Vital Signs BP (!) 102/56   Pulse 69   Temp 98.6 F (37 C) (Oral)   Resp 10   Ht 5' 4.5" (1.638 m)   Wt 65.8 kg   SpO2 98%   BMI 24.50 kg/m  Physical Exam Vitals and nursing note reviewed.  Eyes:     Pupils: Pupils are equal, round, and reactive to light.  Cardiovascular:     Rate and Rhythm: Regular rhythm.  Musculoskeletal:        General: Tenderness present.     Cervical back: Neck supple.     Comments: Somewhat diffuse muscular tenderness.  Mild edema bilateral lower extremities.  Skin:    Capillary Refill: Capillary refill takes less than 2 seconds.  Neurological:     General: No focal deficit present.     Mental Status: She is alert and oriented to person, place, and time.     ED Results / Procedures / Treatments   Labs (all labs ordered are listed, but only abnormal results are displayed) Labs Reviewed  BASIC METABOLIC PANEL - Abnormal; Notable for the following components:      Result Value   BUN 39 (*)    Creatinine, Ser 1.90 (*)    GFR, Estimated 27 (*)    All other components within normal limits  HEPATIC FUNCTION PANEL - Abnormal; Notable for the following components:   AST 49 (*)    ALT 45 (*)    All other components within normal limits  CBC  MAGNESIUM  URINALYSIS, W/ REFLEX TO CULTURE (INFECTION SUSPECTED)    EKG EKG Interpretation  Date/Time:  Tuesday Dec 22 2022 10:06:15 EDT Ventricular Rate:  67 PR Interval:  147 QRS Duration: 136 QT Interval:  470 QTC Calculation: 497 R Axis:   40 Text Interpretation: Sinus rhythm IVCD, consider atypical LBBB No significant change since last tracing Confirmed by Benjiman Core [id number]) on 12/22/2022 10:50:36 AM  Radiology No results found.  Procedures Procedures    Medications Ordered in ED Medications  sodium chloride 0.9 % bolus 500 mL (500 mLs  Intravenous New Bag/Given 12/22/22 1159)    ED Course/ Medical Decision Making/ A&P  Medical Decision Making Amount and/or Complexity of Data Reviewed Labs: ordered.  Risk Decision regarding hospitalization.   Patient hypotension.  States been feeling dizzy at home.  Has had previous workup for the hypotension.  Upon arrival blood pressure normal but still feeling dizzy.  Reviewed notes and did have potentially recent UTI.  No dysuria now however.  Does have chronic back pain.  Will get basic blood work.  Will get orthostatic vital signs.  Sepsis felt less likely.  Does have an acute kidney injury.  Creatinine reviewed from recent discharge and was 1 and now is up to 1.9.  Continues to be orthostatic although blood pressures improved.  Normal white count and afebrile.  Doubt this is a severe sepsis as the cause.  However will get urinalysis since has recent infection.  Not having dysuria however.  Will discuss with hospitalist however for acute kidney injury and hypotension.       Final Clinical Impression(s) / ED Diagnoses Final diagnoses:  Hypotension, unspecified hypotension type  AKI (acute kidney injury) [facility])    Rx / DC Orders ED Discharge Orders     None         Benjiman Core, MD 12/22/22 1204

## 2022-12-22 NOTE — ED Triage Notes (Signed)
Patient went to her doctor appointment today, had her vitals checked and her BP was 63/37. Feels like the room is spinning. Denies nausea.

## 2022-12-22 NOTE — H&P (Signed)
History and Physical    Megan Brewer ZOX:[id number] DOB: 1946/04/29 DOA: 12/22/2022  PCP: Sharlene Dory, DO (Confirm with patient/family/NH records and if not entered, this has to be entered at [facility] point of entry) Patient coming from: Home  I have personally briefly reviewed patient's old medical records in [facility]  Chief Complaint: Feeling dizzy, BP ran low again  HPI: Megan Brewer is a 77 y.o. female with medical history significant of orthostatic hypotension on midodrine, hypothyroidism, CKD stage IIIa, GERD, lung cancer status post left-sided lumpectomy, chronic HFpEF on torsemide, presented with recurrent near syncope.  Patient has history of orthostatic hypotension with frequent recurrent near syncope's for the last 2 years for which she had extensive cardiac workup 2 years ago including stress test echocardiogram with no significant clear etiology cardiac wise.  Her overall near syncope symptoms has been getting worse since summer 2023 with frequent episodes of near syncope's when her blood pressure dropped as low as 50s systolic.  She has tried several countermeasures including pressure stocking with poor tolerance and eventually recently she started to use a modified wheelchair: And when her blood pressure got low and started to feel lightheadedness she will sit back to the wheelchair and lift her leg up above the level of her heart to improve blood pressure.  However she also reported some spikes of her blood pressure randomly to 170s.  Near-syncope episode.  3 weeks ago she had a severe near-syncope episode and went to see her PCP, it was found she had a UTI and she was treated with 1 week of p.o. antibiotics.  2 weeks ago, he went to see neurology at [facility] for her syncope symptoms, when it was found her blood pressure in the 70s and she was admitted to [facility] for further workup.  And it was found she had another episode of UTI and she again was  treated with 7 days antibiotics which she completed last week.  2 workups in the [facility] however did not you have significant etiology and she was discharged home with midodrine 5 mg 3 times daily which has been taking.  Despite she continued to experience random episodes of near syncope's and she had 1 severe episode this morning.  She also described palpitations blurry vision but denies any numbness weakness of any of the limbs no urinary symptoms no diarrhea. ED Course: At the [facility] ED blood pressure in the 70s on presentation orthostatic vital signs positive.  Blood work showed creatinine 1.9 compared to baseline 1.3-1.6.  Review of Systems: As per HPI otherwise 14 point review of systems negative.    Past Medical History:  Diagnosis Date   Anemia    Arachnoiditis    Bipolar disorder (HCC)    Cataract    removed   Chronic post-thoracotomy pain    CKD (chronic kidney disease)    GERD (gastroesophageal reflux disease)    Hypogammaglobulinemia (HCC)    Hypotension    Hypothyroid    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HCC)    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HCC)    Lung cancer (HCC) 2011, 2014   Memory loss    Migraine    Opioid abuse (HCC)    Osteoporosis    PMR (polymyalgia rheumatica) (HCC) 03/17/2022   Presence of neurostimulator    Restless legs    Sleep apnea     Past Surgical History:  Procedure Laterality Date   ABDOMINAL HYSTERECTOMY     CARPAL TUNNEL RELEASE  CATARACT EXTRACTION Bilateral 2019   CHOLECYSTECTOMY     COLONOSCOPY  2015   [facility]   CT LUNG SCREENING  2018   DENTAL SURGERY  06/17/2021   4 teeth rmoved   DG  BONE DENSITY ([facility] HX)  2018   DIAGNOSTIC MAMMOGRAM  2019   GASTRIC BYPASS     LUNG CANCER SURGERY  02/2010   MULTIPLE TOOTH EXTRACTIONS     PORT A CATH REVISION Right 04/2021   PORT-A-CATH REMOVAL Left 02/07/2021   Procedure: REMOVAL PORT-A-CATH;  Surgeon: Kinsinger, De Blanch, MD;  Location: [facility] ORS;  Service: General;  Laterality: Left;  60  MINUTES ROOM 4   SPINAL CORD STIMULATOR IMPLANT       reports that she has never smoked. She has never used smokeless tobacco. She reports that she does not currently use alcohol. She reports that she does not use drugs.  Allergies  Allergen Reactions   Butorphanol Hives, Swelling and Other (See Comments)    Cerebral pain, "like someone is brushing my brain with a brillo pad"   Erythromycin Base Diarrhea, Itching and Other (See Comments)    Severe diarrhea   Propoxyphene Nausea Only and Other (See Comments)    Depression, Agitation, Blurred Vision   Sumatriptan Nausea And Vomiting, Other (See Comments) and Tinitus    Ringing in the ears, migraines are worse     Tetracycline Nausea Only, Other (See Comments) and Tinitus    Causes ringing in ears, dizziness, migraine, nausea     Ciprofloxacin Hives, Other (See Comments) and Tinitus    Headache, dizziness, ringing in the ears     Corticosteroids Other (See Comments)    12/02/2016 interview by WUJ: Oral dosing causes migraines/nausea/tinnitus. Prev tolerated IV and intranasal admin w/o difficulty.   Doxycycline Other (See Comments)    unknown    Cefixime Other (See Comments)    Headache    Gabapentin Swelling and Other (See Comments)    Throat swells/closes   Isometheptene-Dichloral-Apap Other (See Comments)    Unknown, mood liability   Ketorolac     12/02/2016 interview by WJX: Oral dosing prednisone/corticosteroids causes migraines/nausea/tinnitus. Prev tolerated IV and intranasal admin w/o difficulty.   Prednisone Other (See Comments)    Migraine, dizzy, ringing in ears-can take it IV 12/02/2016 interview by [name]: Oral prednisone dosing causes migraines/nausea/tinnitus. Prev tolerated IV and intranasal admin w/o difficulty.   Promethazine Other (See Comments)    causes severe abdominal pain when taken IV; however she can take PO or IM   Tape Other (See Comments)    Adhesive tapes-patient denies    Family History   Problem Relation Age of Onset   Hypertension Mother    GER disease Mother    Dementia Mother    Osteoporosis Mother    Arthritis Mother    Bipolar disorder Mother    Parkinson's disease Father    Congestive Heart Failure Father    Pneumonia Father    Arthritis Father    Dementia Father    Depression Father    Asthma Sister    Depression Sister    Bipolar disorder Brother    Asthma Daughter    Colon cancer Neg Hx    Esophageal cancer Neg Hx    Stomach cancer Neg Hx    Rectal cancer Neg Hx      Prior to Admission medications   Medication Sig Start Date End Date Taking? Authorizing Provider  acetaminophen (TYLENOL) 500 MG tablet Take 1,000 mg by mouth as  needed for moderate pain.    [provider]  azelastine (ASTELIN) 0.1 % nasal spray Place 2 sprays into both nostrils 2 (two) times daily. Patient taking differently: Place 2 sprays into both nostrils as needed for rhinitis or allergies. 03/10/21   Hetty Blend, FNP  baclofen (LIORESAL) 10 MG tablet Take 10 mg by mouth daily as needed for muscle spasms. 06/03/20   [provider]  Buprenorphine HCl (BELBUCA) 750 MCG FILM Place 750 mcg inside cheek.    Sharlene Dory, DO  buPROPion (WELLBUTRIN XL) 150 MG 24 hr tablet TAKE ONE (1) TABLET BY MOUTH EACH DAY Patient not taking: Reported on 10/30/2022 07/14/22   Corie Chiquito, PMHNP  CALCIUM PO Take 1 tablet by mouth in the morning and at bedtime.    [provider]  cholecalciferol (VITAMIN D3) 25 MCG (1000 UNIT) tablet Take 1,000 Units by mouth daily.    [provider]  COVID-19 mRNA vaccine [phone] (COMIRNATY) syringe Inject into the muscle. Patient not taking: Reported on 10/30/2022 06/03/22   Judyann Munson, MD  cyclobenzaprine (FLEXERIL) 5 MG tablet Take 1 tablet (5 mg total) by mouth 3 (three) times daily as needed for muscle spasms. Patient not taking: Reported on 12/17/2022 11/11/22   Horton Chin, MD  cycloSPORINE  (RESTASIS) 0.05 % ophthalmic emulsion Place 1 drop into both eyes in the morning, at noon, in the evening, and at bedtime.    [provider]  dexlansoprazole (DEXILANT) 60 MG capsule Take 1 capsule (60 mg total) by mouth daily with breakfast. 04/30/22   Esterwood, Amy S, PA-C  famotidine (PEPCID) 20 MG tablet Take 1 tablet (20 mg total) by mouth at bedtime. 04/30/22   Esterwood, Amy S, PA-C  ferrous sulfate 325 (65 FE) MG tablet Take 650 mg by mouth daily with breakfast.    [provider]  GAMMAGARD 20 GM/200ML SOLN Inject 40 g into the vein every 21 ( twenty-one) days. INFUSE 40G INTRAVENOUSLY EVERY 3 WEEKS 02/27/22   [provider]  heparin lock flush 100 UNIT/ML SOLN injection Inject 500 Units into the vein every 21 ( twenty-one) days. 03/31/21   [provider]  HYDROcodone-acetaminophen (NORCO/VICODIN) 5-325 MG tablet Take 1-2 tablets by mouth every 6 (six) hours as needed for severe pain. Patient not taking: Reported on 12/17/2022 11/24/22   Melton Alar R, PA-C  ibuprofen (ADVIL) 200 MG tablet Take 600 mg by mouth as needed for mild pain.    [provider]  influenza vaccine adjuvanted (FLUAD QUADRIVALENT) 0.5 ML injection Inject into the muscle. Patient not taking: Reported on 10/30/2022 06/03/22   Judyann Munson, MD  KEVZARA 200 MG/1. SOAJ Inject 1.14 mLs into the skin every 14 (fourteen) days. 09/09/22   [provider]  levothyroxine (SYNTHROID) 75 MCG tablet TAKE ONE (1) TABLET BY MOUTH EACH DAY BEFORE BREAKFAST ON EMPTY STOMACH Patient taking differently: Take 75 mcg by mouth daily before breakfast. 30 MINUTES BEFORE BREAKFAST ON EMPTY STOMACH. 09/11/22   Sharlene Dory, DO  magnesium oxide (MAG-OX) 400 MG tablet Take 400 mg by mouth daily.     [provider]  methocarbamol (ROBAXIN-750) 750 MG tablet Take 1 tablet (750 mg total) by mouth 4 (four) times daily. Patient not taking: Reported on 12/17/2022  10/27/22   Horton Chin, MD  midodrine (PROAMATINE) 5 MG tablet Take 5 mg by mouth 3 (three) times daily with meals.    Sharlene Dory, DO  mupirocin ointment (BACTROBAN) 2 %  Apply 1 application. topically 2 (two) times daily. Patient not taking: Reported on 10/30/2022 12/29/21   Sharon Seller, NP  MYRBETRIQ 50 MG TB24 tablet Take 50 mg by mouth daily. 10/07/22   [provider]  ondansetron (ZOFRAN) 8 MG tablet Take by mouth every 8 (eight) hours as needed for nausea or vomiting.    Sharlene Dory, DO  potassium chloride (KLOR-CON) 10 MEQ tablet TAKE ONE (1) TABLET BY MOUTH FOUR (4) TIMES DAILY Patient not taking: Reported on 12/17/2022 11/23/22   Sharlene Dory, DO  pregabalin (LYRICA) 50 MG capsule TAKE 1 CAPSULE BY MOUTH THREE TIMES A DAY Patient not taking: Reported on 12/17/2022 12/09/22   Sharlene Dory, DO  primidone (MYSOLINE) 50 MG tablet Take 1 tablet (50 mg total) by mouth in the morning and at bedtime. Patient not taking: Reported on 10/30/2022 08/25/22   Sharlene Dory, DO  pyridoxine (B-6) 100 MG tablet Take 100 mg by mouth daily.    Sharlene Dory, DO  rOPINIRole (REQUIP) 0.5 MG tablet Take 0.5 mg by mouth 3 (three) times daily.    Sharlene Dory, DO  RSV vaccine recomb adjuvanted (AREXVY) 120 MCG/0.5ML injection Inject into the muscle. Patient not taking: Reported on 10/30/2022 06/11/22   Judyann Munson, MD  sodium chloride, PF, 0.9 % injection Inject 5-10 mLs into the vein as needed (Immunoglobulin). 02/12/21   [provider]  sucralfate (CARAFATE) 1 GM/10ML suspension Take 10 mLs (1 g total) by mouth 4 (four) times daily as needed. 07/13/22   Sharlene Dory, DO  tamsulosin (FLOMAX) 0.4 MG CAPS capsule Take 1 capsule (0.4 mg total) by mouth daily. 01/23/21   Burnadette Pop, MD  torsemide (DEMADEX) 20 MG tablet Take 1 tablet (20 mg total) by mouth 2 (two) times daily. 09/01/22   Sharlene Dory, DO  triamcinolone cream (KENALOG) 0.1 % Apply 1 application topically 2 (two) times daily as needed (rash). Patient not taking: Reported on 12/17/2022    [provider]  UNABLE TO FIND Take 1 tablet by mouth daily. Med Name: Verdis Frederickson 1 by mouth daily for UTI prevention Patient not taking: Reported on 12/17/2022    [provider]  vitamin B-12 (CYANOCOBALAMIN) 1000 MCG tablet Take 1,000 mcg by mouth daily. 02/14/21   [provider]  Vitamin D, Ergocalciferol, (DRISDOL) 1.25 MG (50000 UNIT) CAPS capsule Take 1 capsule (50,000 Units total) by mouth every 7 (seven) days. Patient taking differently: Take 50,000 Units by mouth every 7 (seven) days. Thursdays 08/27/22   [name], Drema Pry, MD  ZINC OXIDE, TOPICAL, 10 % CREA Apply 1 application topically 2 (two) times daily as needed (rash). Patient not taking: Reported on 12/17/2022 02/14/21   Glade Lloyd, MD    Physical Exam: Vitals:   12/22/22 0958 12/22/22 1000 12/22/22 1045 12/22/22 1130  BP:  123/65 (!) 87/69 (!) 102/56  Pulse:  73 82 69  Resp:  18 (!) 24 10  Temp:  98.6 F (37 C)    TempSrc:  Oral    SpO2:  100% 100% 98%  Weight: 65.8 kg     Height: 5' 4.5" (1.638 m)       Constitutional: NAD, calm, comfortable Vitals:   12/22/22 0958 12/22/22 1000 12/22/22 1045 12/22/22 1130  BP:  123/65 (!) 87/69 (!) 102/56  Pulse:  73 82 69  Resp:  18 (!) 24 10  Temp:  98.6 F (37 C)    TempSrc:  Oral  SpO2:  100% 100% 98%  Weight: 65.8 kg     Height: 5' 4.5" (1.638 m)      Eyes: PERRL, lids and conjunctivae normal ENMT: Mucous membranes are moist. Posterior pharynx clear of any exudate or lesions.Normal dentition.  Neck: normal, supple, no masses, no thyromegaly Respiratory: clear to auscultation bilaterally, no wheezing, no crackles. Normal respiratory effort. No accessory muscle use.  Cardiovascular: Regular rate and rhythm, no murmurs / rubs / gallops. No extremity edema. 2+ pedal pulses. No carotid bruits.   Abdomen: no tenderness, no masses palpated. No hepatosplenomegaly. Bowel sounds positive.  Musculoskeletal: no clubbing / cyanosis. No joint deformity upper and lower extremities. Good ROM, no contractures. Normal muscle tone.  Skin: no rashes, lesions, ulcers. No induration Neurologic: CN 2-12 grossly intact. Sensation intact, DTR normal. Strength 5/5 in all 4.  Psychiatric: Normal judgment and insight. Alert and oriented x 3. Normal mood.     Labs on Admission: I have personally reviewed following labs and imaging studies  CBC: Recent Labs  Lab 12/22/22 1007  WBC 6.6  HGB 12.6  HCT 39.9  MCV 93.4  PLT 206   Basic Metabolic Panel: Recent Labs  Lab 12/22/22 1007  NA 136  K 3.5  CL 98  CO2 28  GLUCOSE 92  BUN 39*  CREATININE 1.90*  CALCIUM 9.2  MG 1.8   GFR: Estimated Creatinine Clearance: 22.9 mL/min (A) (by C-G formula based on SCr of 1.9 mg/dL (H)). Liver Function Tests: Recent Labs  Lab 12/22/22 1007  AST 49*  ALT 45*  ALKPHOS 60  BILITOT 0.7  PROT 7.1  ALBUMIN 3.5   No results for input(s): "LIPASE", "AMYLASE" in the last 168 hours. No results for input(s): "AMMONIA" in the last 168 hours. Coagulation Profile: No results for input(s): "INR", "PROTIME" in the last 168 hours. Cardiac Enzymes: No results for input(s): "CKTOTAL", "CKMB", "CKMBINDEX", "TROPONINI" in the last 168 hours. BNP (last 3 results) No results for input(s): "PROBNP" in the last 8760 hours. HbA1C: No results for input(s): "HGBA1C" in the last 72 hours. CBG: No results for input(s): "GLUCAP" in the last 168 hours. Lipid Profile: No results for input(s): "CHOL", "HDL", "LDLCALC", "TRIG", "CHOLHDL", "LDLDIRECT" in the last 72 hours. Thyroid Function Tests: No results for input(s): "TSH", "T4TOTAL", "FREET4", "T3FREE", "THYROIDAB" in the last 72 hours. Anemia Panel: No results for input(s): "VITAMINB12", "FOLATE", "FERRITIN", "TIBC", "IRON", "RETICCTPCT" in the last 72 hours. Urine  analysis:    Component Value Date/Time   COLORURINE YELLOW 03/08/2022 1402   APPEARANCEUR CLEAR 03/08/2022 1402   LABSPEC 1.010 03/08/2022 1402   PHURINE 5.0 03/08/2022 1402   GLUCOSEU NEGATIVE 03/08/2022 1402   HGBUR NEGATIVE 03/08/2022 1402   BILIRUBINUR NEGATIVE 03/08/2022 1402   BILIRUBINUR N/A 03/25/2020 1533   KETONESUR NEGATIVE 03/08/2022 1402   PROTEINUR NEGATIVE 03/08/2022 1402   UROBILINOGEN negative (A) 03/25/2020 1533   NITRITE POSITIVE (A) 03/08/2022 1402   LEUKOCYTESUR NEGATIVE 03/08/2022 1402    Radiological Exams on Admission: No results found.  EKG: Independently reviewed.  Sinus rhythm, chronic LBBB  Assessment/Plan Principal Problem:   Near syncope Active Problems:   Acquired hypothyroidism   Chronic pain syndrome   Polymyalgia rheumatica (HCC)   Orthostatic hypotension  (please populate well all problems here in Problem List. (For example, if patient is on BP meds at home and you resume or decide to hold them, it is a problem that needs to be her. Same for CAD, COPD, HLD and so on)  Near  syncope, recurrent -Probably multifactorial, does appear to have AKI/dehydration, in addition workup at [facility] indicating orthostatic hypotension, clinically also suspect patient has early symptoms of autonomic dysfunction.  Patient denies any history of Parkinson disease.  Other possible contributing factors including polypharmacy, as patient insisted that she has to take torsemide for leg swelling. -Patient very well aware of certain to countermeasures low blood pressure such as sitting and lying down and drinking Gatorade. -Continue midodrine -Hold off torsemide today to see blood pressure response -Will not repeat near-syncope study as most of the studies were done less than 2 weeks ago at [facility] including CTA, echocardiogram and DVT study. -Patient again reported poor tolerance to pressure stocking and does not wish to try again -Orthostatic vital signs -PT  evaluation  AKI on CKD stage III -Probably prerenal -Hold off torsemide, start IV fluid x 12 hours then reevaluate -[facility] at [facility] showed chronic kidney disease -UA is pending  HTN/chronic HFpEF -As above -Patient reports breakthrough uncontrolled hypertension especially after rebound from near syncope/hypotension.  I recommend patient talk to her PCP regarding as needed short acting BP meds such as hydralazine for comfort measure, patient agreed.  Polymyalgia rheumatica -With chronic pain, continue baclofen  Hypothyroidism -Continue Synthroid -Stage 1.5 at [facility] 2 weeks ago  DVT prophylaxis: Heparin subcu Code Status: Full code Family Communication: None at bedside Disposition Plan: Expect less than 2 midnight hospital stay Consults called: None Admission status: Tele obs   Emeline General MD [facility] Pager [phone]  12/22/2022, 1:07 PM

## 2022-12-23 ENCOUNTER — Telehealth: Payer: Self-pay | Admitting: Family Medicine

## 2022-12-23 DIAGNOSIS — Z8249 Family history of ischemic heart disease and other diseases of the circulatory system: Secondary | ICD-10-CM | POA: Diagnosis not present

## 2022-12-23 DIAGNOSIS — Z79890 Hormone replacement therapy (postmenopausal): Secondary | ICD-10-CM | POA: Diagnosis not present

## 2022-12-23 DIAGNOSIS — E860 Dehydration: Secondary | ICD-10-CM | POA: Diagnosis present

## 2022-12-23 DIAGNOSIS — Z818 Family history of other mental and behavioral disorders: Secondary | ICD-10-CM | POA: Diagnosis not present

## 2022-12-23 DIAGNOSIS — G2581 Restless legs syndrome: Secondary | ICD-10-CM | POA: Diagnosis present

## 2022-12-23 DIAGNOSIS — K219 Gastro-esophageal reflux disease without esophagitis: Secondary | ICD-10-CM | POA: Diagnosis present

## 2022-12-23 DIAGNOSIS — M353 Polymyalgia rheumatica: Secondary | ICD-10-CM | POA: Diagnosis present

## 2022-12-23 DIAGNOSIS — E785 Hyperlipidemia, unspecified: Secondary | ICD-10-CM | POA: Diagnosis present

## 2022-12-23 DIAGNOSIS — Z1629 Resistance to other single specified antibiotic: Secondary | ICD-10-CM | POA: Diagnosis present

## 2022-12-23 DIAGNOSIS — N390 Urinary tract infection, site not specified: Secondary | ICD-10-CM | POA: Diagnosis present

## 2022-12-23 DIAGNOSIS — I5032 Chronic diastolic (congestive) heart failure: Secondary | ICD-10-CM | POA: Diagnosis present

## 2022-12-23 DIAGNOSIS — I951 Orthostatic hypotension: Secondary | ICD-10-CM | POA: Diagnosis present

## 2022-12-23 DIAGNOSIS — R55 Syncope and collapse: Secondary | ICD-10-CM | POA: Diagnosis present

## 2022-12-23 DIAGNOSIS — E039 Hypothyroidism, unspecified: Secondary | ICD-10-CM | POA: Diagnosis present

## 2022-12-23 DIAGNOSIS — E876 Hypokalemia: Secondary | ICD-10-CM | POA: Diagnosis present

## 2022-12-23 DIAGNOSIS — M810 Age-related osteoporosis without current pathological fracture: Secondary | ICD-10-CM | POA: Diagnosis present

## 2022-12-23 DIAGNOSIS — Z9884 Bariatric surgery status: Secondary | ICD-10-CM | POA: Diagnosis not present

## 2022-12-23 DIAGNOSIS — F319 Bipolar disorder, unspecified: Secondary | ICD-10-CM | POA: Diagnosis present

## 2022-12-23 DIAGNOSIS — I130 Hypertensive heart and chronic kidney disease with heart failure and stage 1 through stage 4 chronic kidney disease, or unspecified chronic kidney disease: Secondary | ICD-10-CM | POA: Diagnosis present

## 2022-12-23 DIAGNOSIS — N1831 Chronic kidney disease, stage 3a: Secondary | ICD-10-CM | POA: Diagnosis present

## 2022-12-23 DIAGNOSIS — G894 Chronic pain syndrome: Secondary | ICD-10-CM | POA: Diagnosis present

## 2022-12-23 DIAGNOSIS — N179 Acute kidney failure, unspecified: Secondary | ICD-10-CM | POA: Diagnosis present

## 2022-12-23 DIAGNOSIS — Z85118 Personal history of other malignant neoplasm of bronchus and lung: Secondary | ICD-10-CM | POA: Diagnosis not present

## 2022-12-23 DIAGNOSIS — Z79899 Other long term (current) drug therapy: Secondary | ICD-10-CM | POA: Diagnosis not present

## 2022-12-23 DIAGNOSIS — Z825 Family history of asthma and other chronic lower respiratory diseases: Secondary | ICD-10-CM | POA: Diagnosis not present

## 2022-12-23 LAB — BASIC METABOLIC PANEL
Anion gap: 6 (ref 5–15)
BUN: 36 mg/dL — ABNORMAL HIGH (ref 8–23)
CO2: 26 mmol/L (ref 22–32)
Calcium: 7.8 mg/dL — ABNORMAL LOW (ref 8.9–10.3)
Chloride: 105 mmol/L (ref 98–111)
Creatinine, Ser: 1.7 mg/dL — ABNORMAL HIGH (ref 0.44–1.00)
GFR, Estimated: 31 mL/min — ABNORMAL LOW (ref >60)
Glucose, Bld: 94 mg/dL (ref 70–99)
Potassium: 3.3 mmol/L — ABNORMAL LOW (ref 3.5–5.1)
Sodium: 137 mmol/L (ref 135–145)

## 2022-12-23 LAB — URINE CULTURE: Culture: >=100000 — AB

## 2022-12-23 MED ORDER — MIDODRINE HCL 5 MG PO TABS
5.0000 mg | ORAL_TABLET | Freq: Every day | ORAL | Status: DC
  Administered 2022-12-24: 5 mg via ORAL
  Filled 2022-12-23: qty 1

## 2022-12-23 MED ORDER — SODIUM CHLORIDE 0.9 % IV SOLN
1.0000 g | INTRAVENOUS | Status: DC
  Administered 2022-12-23: 1 g via INTRAVENOUS
  Filled 2022-12-23: qty 10

## 2022-12-23 MED ORDER — FLUDROCORTISONE ACETATE 0.1 MG PO TABS
0.1000 mg | ORAL_TABLET | Freq: Every day | ORAL | Status: DC
  Administered 2022-12-23 – 2022-12-24 (×2): 0.1 mg via ORAL
  Filled 2022-12-23 (×2): qty 1

## 2022-12-23 MED ORDER — CEFDINIR 300 MG PO CAPS
300.0000 mg | ORAL_CAPSULE | Freq: Two times a day (BID) | ORAL | Status: DC
  Administered 2022-12-24: 300 mg via ORAL
  Filled 2022-12-23 (×2): qty 1

## 2022-12-23 MED ORDER — COSYNTROPIN 0.25 MG IJ SOLR
0.2500 mg | Freq: Once | INTRAMUSCULAR | Status: AC
  Administered 2022-12-24: 0.25 mg via INTRAVENOUS
  Filled 2022-12-23: qty 0.25

## 2022-12-23 MED ORDER — SODIUM CHLORIDE 0.9 % IV SOLN: INTRAVENOUS | Status: DC

## 2022-12-23 MED ORDER — POTASSIUM CHLORIDE CRYS ER 20 MEQ PO TBCR
40.0000 meq | EXTENDED_RELEASE_TABLET | Freq: Once | ORAL | Status: AC
  Administered 2022-12-23: 40 meq via ORAL
  Filled 2022-12-23: qty 2

## 2022-12-23 NOTE — Evaluation (Signed)
Physical Therapy Evaluation Patient Details Name: Megan Brewer MRN: [id number] DOB: 01/31/1946 Today's Date: 12/23/2022  History of Present Illness  77 yo female admitted with near syncope, recurrent orthostatic hypotension. Hx of spinal cord stimulator, bipolar d/o, orthostatic hypotension, lung ca, HF, RLS, CKD, polymyalgia rheumatica, chronic back pain, gastric bypass  Clinical Impression  On eval, pt was Min guard A for mobility. She was able to walk in her room around bed x 2. Initiated session with bil UE arm raises x 20 reps, glute sets x 20 reps, ankle pumps x 15 reps (in bed with HOB elevated). Sat EOB for a few minutes. Stood at bedside for about 1 minute. Had pt sit back down while I assessed BP: 118/56. Pt walked around bed and to recliner. Had pt sit down while I assessed BP: 111/55. Pt walked from recliner and back around bed. Assisted back to bed at her request. Will plan to continue to follow and progress activity as safely able.        Recommendations for follow up therapy are one component of a multi-disciplinary discharge planning process, led by the attending physician.  Recommendations may be updated based on patient status, additional functional criteria and insurance authorization.  Follow Up Recommendations       Assistance Recommended at Discharge Frequent or constant Supervision/Assistance  Patient can return home with the following  Assistance with cooking/housework;Assist for transportation;Help with stairs or ramp for entrance;A little help with bathing/dressing/bathroom;A little help with walking and/or transfers    Equipment Recommendations None recommended by PT  Recommendations for Other Services       Functional Status Assessment Patient has had a recent decline in their functional status and demonstrates the ability to make significant improvements in function in a reasonable and predictable amount of time.     Precautions / Restrictions  Precautions Precautions: Fall Precaution Comments: monitor BP; orthostatic hypotension Required Braces or Orthoses:  (has abd binder in room) Restrictions Weight Bearing Restrictions: No      Mobility  Bed Mobility Overal bed mobility: Modified Independent                  Transfers Overall transfer level: Supervision                      Ambulation/Gait Ambulation/Gait assistance: Min guard Gait Distance (Feet): 15 Feet (x2) Assistive device:  ("furniture cruising") Gait Pattern/deviations: Step-through pattern, Decreased stride length       General Gait Details: MIn guard for safety. No overt LOB. No assist provided.  Stairs            Wheelchair Mobility    Modified Rankin (Stroke Patients Only)       Balance Overall balance assessment: Needs assistance         Standing balance support: During functional activity Standing balance-Leahy Scale: Fair                               Pertinent Vitals/Pain Pain Assessment Pain Assessment: Faces Faces Pain Scale: Hurts even more Pain Location: back Pain Descriptors / Indicators: Grimacing Pain Intervention(s): Limited activity within patient's tolerance, Monitored during session, Repositioned    Home Living Family/patient expects to be discharged to:: Private residence Living Arrangements: Spouse/significant other Available Help at Discharge: Family Type of Home: House Home Access: Stairs to enter Entrance Stairs-Rails: Lawyer of Steps: 3   Home Layout: Able to  live on main level with bedroom/bathroom Home Equipment: Rolling Walker (2 wheels);Cane - single point;Wheelchair - power      Prior Function Prior Level of Function : Independent/Modified Independent             Mobility Comments: "furniture cruises" short distances for last 2 weeks (15 feet or less)       Hand Dominance        Extremity/Trunk Assessment   Upper  Extremity Assessment Upper Extremity Assessment: Overall WFL for tasks assessed    Lower Extremity Assessment Lower Extremity Assessment: Generalized weakness    Cervical / Trunk Assessment Cervical / Trunk Assessment: Normal  Communication   Communication: No difficulties  Cognition Arousal/Alertness: Awake/alert Behavior During Therapy: WFL for tasks assessed/performed Overall Cognitive Status: Within Functional Limits for tasks assessed                                          General Comments      Exercises General Exercises - Upper Extremity Shoulder Flexion: AROM, Both, 20 reps (supine with HOB elevated ~25-30 degrees and legs crossed) General Exercises - Lower Extremity Ankle Circles/Pumps: 15 reps, Supine Gluteal Sets: 20 reps, Supine (with HOB elevated and legs crossed)   Assessment/Plan    PT Assessment Patient needs continued PT services  PT Problem List Decreased mobility;Decreased balance;Decreased activity tolerance;Decreased strength;Pain       PT Treatment Interventions DME instruction;Gait training;Therapeutic exercise;Balance training;Stair training;Functional mobility training;Therapeutic activities;Patient/family education    PT Goals (Current goals can be found in the Care Plan section)  Acute Rehab PT Goals Patient Stated Goal: to get better PT Goal Formulation: With patient/family Time For Goal Achievement: 01/06/23 Potential to Achieve Goals: Good    Frequency Min 1X/week     Co-evaluation               AM-PAC PT "6 Clicks" Mobility  Outcome Measure Help needed turning from your back to your side while in a flat bed without using bedrails?: None Help needed moving from lying on your back to sitting on the side of a flat bed without using bedrails?: None Help needed moving to and from a bed to a chair (including a wheelchair)?: None Help needed standing up from a chair using your arms (e.g., wheelchair or bedside  chair)?: None Help needed to walk in hospital room?: A Little Help needed climbing 3-5 steps with a railing? : A Little 6 Click Score: 22    End of Session Equipment Utilized During Treatment: Gait belt Activity Tolerance: Patient tolerated treatment well Patient left: in bed;with call bell/phone within reach;with family/visitor present   PT Visit Diagnosis: Difficulty in walking, not elsewhere classified (R26.2)    Time: 1610-9604 PT Time Calculation (min) (ACUTE ONLY): 30 min   Charges:   PT Evaluation $PT Eval Low Complexity: 1 Low PT Treatments $Gait Training: 8-22 mins           Faye Ramsay, PT Acute Rehabilitation  Office: [phone]

## 2022-12-23 NOTE — Progress Notes (Addendum)
PROGRESS NOTE  Megan Brewer ZOX:[id number] DOB: Nov 01, 1945 DOA: 12/22/2022 PCP: Sharlene Dory, DO   LOS: 0 days   Brief Narrative / Interim history: 77 year old female with orthostatic hypotension on midodrine, hypothyroidism, CKD 3A, lung cancer status post left-sided lumpectomy, chronic diastolic CHF on torsemide comes into the hospital with recurrent symptoms.  He has a history of recurrent orthostatic hypotension with frequent near syncope episodes for the past 2 years, with extensive cardiac workup without a clear cause.  Recently hospitalized at [facility] about 2 weeks ago.  She was referred to be seen by neurology at [facility], however upon arrival her systolic was in the 70s and she was sent straight to the hospital.  Workup they are unremarkable, was discharged home on midodrine which she has been taking, and her torsemide was changed to PRN.  Upon reaching home, she noticed that she is fluid overloaded with a 15 pound weight gain, and took torsemide twice daily for a few days, then once daily, and she was about to convert it to PRN.  Tells me she lost about 12 pounds  Subjective / 24h Interval events: Doing well this morning, feels about the same as she has always been.  Denies any dizziness, but tells me she does not truly get dizziness until she is about to pass out, feels like she has a "helmet" on her head and feels the same way today.  No abdominal discomfort, nausea, vomiting  Assesement and Plan: Principal Problem:   Near syncope Active Problems:   Acquired hypothyroidism   Chronic pain syndrome   Polymyalgia rheumatica (HCC)   Orthostatic hypotension   Principal problem Near syncope, recurrent, in the setting of chronic orthostatic hypotension -suspect a degree of autonomic dysfunction given extensive negative workup in the past.  She is well aware of countermeasures to low blood pressure such as sitting, laying down, drinking fluids and keeping her feet elevated.   Continue midodrine.  Hold off torsemide as she is getting fluids.  TSH, cortisol checked at [facility] last week and they are unremarkable  Active problems AKI on CKD 3A -most recent creatinine around 1.1 when she was at [facility].  Hold off torsemide, was given fluids, creatinine improved to 1.7 from 1.9 on admission.  Systolic in 60s this morning upon standing, continue fluids for another day.  UTI-urine culture sent.  Urinalysis on admission with multiple bacteria, pyuria.  She has been having symptoms, and frustrated about recurrent UTIs.  She does have a urologist.  Start ceftriaxone and monitor cultures  Chronic diastolic CHF -with a degree of chronic lower extremity swelling  Hypokalemia-replace potassium  PMR -continue baclofen  Hypothyroidism-continue Synthroid  Scheduled Meds:  Chlorhexidine Gluconate Cloth  6 each Topical Daily   cycloSPORINE  1 drop Both Eyes BID   famotidine  10 mg Oral QHS   ferrous sulfate  650 mg Oral Q breakfast   heparin  5,000 Units Subcutaneous Q12H   levothyroxine  75 mcg Oral QAC breakfast   magnesium oxide  400 mg Oral Daily   midodrine  5 mg Oral TID WC   mirabegron ER  25 mg Oral Daily   pantoprazole  40 mg Oral Daily   rOPINIRole  0.5 mg Oral TID   sodium chloride flush  10-40 mL Intracatheter Q12H   tamsulosin  0.4 mg Oral Daily   Continuous Infusions:  sodium chloride     cefTRIAXone (ROCEPHIN)  IV 1 g (12/23/22 0941)   PRN Meds:.acetaminophen, ondansetron, sodium chloride flush, sucralfate  Current Outpatient Medications  Medication Instructions   acetaminophen (TYLENOL) 1,000 mg, Oral, As needed   azelastine (ASTELIN) 0.1 % nasal spray 2 sprays, Each Nare, 2 times daily   baclofen (LIORESAL) 10 mg, Oral, Daily PRN   CALCIUM PO 1 tablet, Oral, 2 times daily   cholecalciferol (VITAMIN D3) 1,000 Units, Oral, Daily   cyanocobalamin (VITAMIN B12) 1,000 mcg, Oral, Daily   cycloSPORINE (RESTASIS) 0.05 % ophthalmic emulsion 1 drop, Both Eyes, 4  times daily   dexlansoprazole (DEXILANT) 60 mg, Oral, Daily with breakfast   famotidine (PEPCID) 20 mg, Oral, Daily at bedtime   ferrous sulfate 650 mg, Oral, Daily with breakfast   Gammagard 40 g, Intravenous, Every 21 days, INFUSE 40G INTRAVENOUSLY EVERY 3 WEEKS   heparin lock flush 500 Units, Intravenous, Every 21 days   ibuprofen (ADVIL) 600 mg, Oral, As needed   KEVZARA 200 MG/1. SOAJ 1.14 mLs, Subcutaneous, Every 14 days   levothyroxine (SYNTHROID) 75 MCG tablet TAKE ONE (1) TABLET BY MOUTH EACH DAY BEFORE BREAKFAST ON EMPTY STOMACH   magnesium oxide (MAG-OX) 400 mg, Oral, Daily   midodrine (PROAMATINE) 5 mg, Oral, 3 times daily with meals   mupirocin ointment (BACTROBAN) 2 % 1 application , Topical, 2 times daily   Myrbetriq 50 mg, Oral, Daily   ondansetron (ZOFRAN) 8 MG tablet Oral, Every 8 hours PRN   potassium chloride (KLOR-CON) 10 MEQ tablet TAKE ONE (1) TABLET BY MOUTH FOUR (4) TIMES DAILY   pyridoxine (B-6) 100 mg, Oral, Daily   rOPINIRole (REQUIP) 0.5 mg, Oral, 3 times daily   sucralfate (CARAFATE) 1 g, Oral, 4 times daily PRN   tamsulosin (FLOMAX) 0.4 mg, Oral, Daily   torsemide (DEMADEX) 20 mg, Oral, 2 times daily   triamcinolone cream (KENALOG) 0.1 % 1 application , 2 times daily PRN   Vitamin D (Ergocalciferol) (DRISDOL) 50,000 Units, Oral, Every 7 days   ZINC OXIDE, TOPICAL, 10 % CREA 1 application , Apply externally, 2 times daily PRN    Diet Orders (From admission, onward)     Start     Ordered   12/23/22 0849  Diet regular Fluid consistency: Thin  Diet effective now       Question:  Fluid consistency:  Answer:  Thin   12/23/22 0849            DVT prophylaxis: heparin injection 5,000 Units Start: 12/22/22 2200   Lab Results  Component Value Date   PLT 206 12/22/2022      Code Status: Full Code  Family Communication: no family at bedside   Status is: Observation The patient will require care spanning > 2 midnights and should be  moved to inpatient because: AKI, IV fluids   Level of care: Telemetry  Consultants:  none  Objective: Vitals:   12/22/22 1956 12/23/22 0000 12/23/22 0421 12/23/22 0800  BP: (!) 96/49 (!) 101/57 (!) 95/47   Pulse: 70 81 73   Resp: 18 18 18    Temp: 97.8 F (36.6 C) 98.2 F (36.8 C) 98.4 F (36.9 C)   TempSrc: Oral Oral Oral   SpO2: 100%  98% 99%  Weight:      Height:        Intake/Output Summary (Last 24 hours) at 12/23/2022 1127 Last data filed at 12/23/2022 0950 Gross per 24 hour  Intake 1676.25 ml  Output --  Net 1676.25 ml   Wt Readings from Last 3 Encounters:  12/22/22 65.8 kg  11/24/22 64.4 kg  11/12/22  64.4 kg    Examination:  Constitutional: NAD Eyes: no scleral icterus ENMT: Mucous membranes are moist.  Neck: normal, supple Respiratory: clear to auscultation bilaterally, no wheezing, no crackles. Normal respiratory effort. No accessory muscle use.  Cardiovascular: Regular rate and rhythm, no murmurs / rubs / gallops. Trace edema Abdomen: non distended, no tenderness. Bowel sounds positive.  Musculoskeletal: no clubbing / cyanosis.   Data Reviewed: I have independently reviewed following labs and imaging studies   CBC Recent Labs  Lab 12/22/22 1007  WBC 6.6  HGB 12.6  HCT 39.9  PLT 206  MCV 93.4  MCH 29.5  MCHC 31.6  RDW 14.7    Recent Labs  Lab 12/22/22 1007 12/23/22 0320  NA 136 137  K 3.5 3.3*  CL 98 105  CO2 28 26  GLUCOSE 92 94  BUN 39* 36*  CREATININE 1.90* 1.70*  CALCIUM 9.2 7.8*  AST 49*  --   ALT 45*  --   ALKPHOS 60  --   BILITOT 0.7  --   ALBUMIN 3.5  --   MG 1.8  --     ------------------------------------------------------------------------------------------------------------------ No results for input(s): "CHOL", "HDL", "LDLCALC", "TRIG", "CHOLHDL", "LDLDIRECT" in the last 72 hours.  No results found for:  "HGBA1C" ------------------------------------------------------------------------------------------------------------------ No results for input(s): "TSH", "T4TOTAL", "T3FREE", "THYROIDAB" in the last 72 hours.  Invalid input(s): "FREET3"  Cardiac Enzymes No results for input(s): "CKMB", "TROPONINI", "MYOGLOBIN" in the last 168 hours.  Invalid input(s): "CK" ------------------------------------------------------------------------------------------------------------------    Component Value Date/Time   BNP 44.8 01/17/2021 0628    CBG: No results for input(s): "GLUCAP" in the last 168 hours.  No results found for this or any previous visit (from the past 240 hour(s)).   Radiology Studies: No results found.   Pamella Pert, MD, PhD [facility]  Between 7 am - 7 pm I am available, please contact me via Amion (for emergencies) or Securechat (non urgent messages)  Between 7 pm - 7 am I am not available, please contact night coverage MD/APP via Amion

## 2022-12-23 NOTE — Telephone Encounter (Signed)
[facility]) called stating that pt has been admitted to [facility] hospital and left his plan of care:  Function Mobility, Pain Control, Exercise, Self Promotion  3 total visits   Will eval after pt's Hospital visit

## 2022-12-23 NOTE — Progress Notes (Signed)
Bed alarm going off. Patient walked to the bathroom without assistance. Patient eduction reinforced to go about mobility with caution due to + orthostatic BP. Patient refuse teaching.  Patient assistance provided back to bed.

## 2022-12-24 ENCOUNTER — Ambulatory Visit: Payer: Medicare PPO

## 2022-12-24 DIAGNOSIS — R55 Syncope and collapse: Secondary | ICD-10-CM | POA: Diagnosis not present

## 2022-12-24 LAB — ACTH STIMULATION, 3 TIME POINTS
Cortisol, 30 Min: 25.8 ug/dL
Cortisol, 60 Min: 26.4 ug/dL
Cortisol, Base: 15.2 ug/dL

## 2022-12-24 LAB — BASIC METABOLIC PANEL
Anion gap: 7 (ref 5–15)
BUN: 20 mg/dL (ref 8–23)
CO2: 21 mmol/L — ABNORMAL LOW (ref 22–32)
Calcium: 7.9 mg/dL — ABNORMAL LOW (ref 8.9–10.3)
Chloride: 113 mmol/L — ABNORMAL HIGH (ref 98–111)
Creatinine, Ser: 0.92 mg/dL (ref 0.44–1.00)
GFR, Estimated: >60 mL/min (ref >60)
Glucose, Bld: 102 mg/dL — ABNORMAL HIGH (ref 70–99)
Potassium: 3.2 mmol/L — ABNORMAL LOW (ref 3.5–5.1)
Sodium: 141 mmol/L (ref 135–145)

## 2022-12-24 LAB — URINE CULTURE

## 2022-12-24 MED ORDER — POTASSIUM CHLORIDE CRYS ER 20 MEQ PO TBCR
40.0000 meq | EXTENDED_RELEASE_TABLET | Freq: Once | ORAL | Status: AC
  Administered 2022-12-24: 40 meq via ORAL
  Filled 2022-12-24: qty 2

## 2022-12-24 MED ORDER — CEFDINIR 300 MG PO CAPS: 300.0000 mg | ORAL_CAPSULE | Freq: Two times a day (BID) | ORAL | 0 refills | Status: AC

## 2022-12-24 MED ORDER — ASPIRIN-ACETAMINOPHEN-CAFFEINE 250-250-65 MG PO TABS
1.0000 | ORAL_TABLET | Freq: Four times a day (QID) | ORAL | Status: DC | PRN
  Administered 2022-12-24: 1 via ORAL
  Filled 2022-12-24 (×2): qty 1

## 2022-12-24 MED ORDER — HEPARIN SOD (PORK) LOCK FLUSH 100 UNIT/ML IV SOLN
500.0000 [IU] | INTRAVENOUS | Status: AC | PRN
  Administered 2022-12-24: 500 [IU]
  Filled 2022-12-24: qty 5

## 2022-12-24 MED ORDER — POTASSIUM CHLORIDE CRYS ER 20 MEQ PO TBCR: 20.0000 meq | EXTENDED_RELEASE_TABLET | Freq: Once | ORAL | 0 refills | Status: DC

## 2022-12-24 MED ORDER — TORSEMIDE 20 MG PO TABS: 20.0000 mg | ORAL_TABLET | Freq: Every day | ORAL | 3 refills | Status: DC | PRN

## 2022-12-24 MED ORDER — SODIUM CHLORIDE 0.9 % IV SOLN
1000.0000 mg | Freq: Once | INTRAVENOUS | Status: AC
  Administered 2022-12-24: 1000 mg via INTRAVENOUS
  Filled 2022-12-24: qty 16

## 2022-12-24 MED ORDER — AZELASTINE HCL 0.1 % NA SOLN
2.0000 | Freq: Two times a day (BID) | NASAL | Status: DC
  Administered 2022-12-24 (×2): 2 via NASAL
  Filled 2022-12-24: qty 30

## 2022-12-24 MED ORDER — FLUDROCORTISONE ACETATE 0.1 MG PO TABS: 0.1000 mg | ORAL_TABLET | Freq: Every day | ORAL | 0 refills | Status: DC

## 2022-12-24 MED ORDER — MIDODRINE HCL 5 MG PO TABS: 5.0000 mg | ORAL_TABLET | Freq: Two times a day (BID) | ORAL | 0 refills | Status: DC

## 2022-12-24 NOTE — Discharge Summary (Signed)
Physician Discharge Summary  Megan Brewer NGE:[id number] DOB: [birth year] DOA: 12/22/2022  PCP: Sharlene Dory, DO  Admit date: 12/22/2022 Discharge date: 12/24/2022  Admitted From: home Disposition:  home  Recommendations for Outpatient Follow-up:  Follow up with PCP in 1-2 weeks Please obtain BMP/CBC in one week  Home Health: PT Equipment/Devices: none  Discharge Condition: stable CODE STATUS: Full code Diet Orders (From admission, onward)     Start     Ordered   12/23/22 0849  Diet regular Fluid consistency: Thin  Diet effective now       Question:  Fluid consistency:  Answer:  Thin   12/23/22 0849            Brief Narrative / Interim history: 77 year old female with orthostatic hypotension on midodrine, hypothyroidism, CKD 3A, lung cancer status post left-sided lumpectomy, chronic diastolic CHF on torsemide comes into the hospital with recurrent symptoms.  He has a history of recurrent orthostatic hypotension with frequent near syncope episodes for the past 2 years, with extensive cardiac workup without a clear cause.  Recently hospitalized at [facility] about 2 weeks ago.  She was referred to be seen by neurology at [facility], however upon arrival her systolic was in the 70s and she was sent straight to the hospital.  Workup they are unremarkable, was discharged home on midodrine which she has been taking, and her torsemide was changed to PRN.  Upon reaching home, she noticed that she is fluid overloaded with a 15 pound weight gain, and took torsemide twice daily for a few days, then once daily, and she was about to convert it to PRN.  Tells me she lost about 12 pounds  Hospital Course / Discharge diagnoses: Principal Problem:   Near syncope Active Problems:   Acquired hypothyroidism   Chronic pain syndrome   Polymyalgia rheumatica (HCC)   Orthostatic hypotension   Principal problem Near syncope, recurrent, in the setting of chronic orthostatic hypotension -suspect  a degree of autonomic dysfunction given extensive negative workup in the past.  She is well aware of countermeasures to low blood pressure such as sitting, laying down, drinking fluids and keeping her feet elevated.  Patient was started on midodrine, continue.  Discussed with her about alternative treatments including Florinef and she showed interest in that.  She was converted to midodrine in the morning at lunchtime and Florinef in the evening.  She received IV fluids as she was quite dehydrated on arrival, and overall her blood pressure has improved and she is not as severely orthostatic as on arrival.  Discussed regarding use of torsemide and perhaps not as often as she did upon discharge from her [facility] hospitalization   Active problems AKI on CKD 3A -most recent creatinine around 1.1 when she was at [facility].  Patient's creatinine on admission was 1.9, she was given fluids and eventually creatinine has normalized to 0.9. UTI-urine culture sent.  Urinalysis on admission with multiple bacteria, pyuria.  She has been having symptoms, and frustrated about recurrent UTIs.  She ended up speciating Proteus which was resistant to nitrofurantoin, intermediate to imipenem but otherwise sensitive.  Will be converted to p.o. Chronic diastolic CHF -with a degree of chronic lower extremity swelling Hypokalemia-replace potassium, slightly lower with resumption of her kidney function, supplemented in the hospital and will continue supplementation for few additional days at home.  She was advised to get blood work done through her PCP within a week.  PMR, chronic pain-continue baclofen.  She has been getting  1 g of Solu-Medrol every 6 weeks as an outpatient in the infusion center.  Due to steroid use she underwent a cosyntropin stim test which ruled out adrenal insufficiency Hypothyroidism-continue Synthroid  Sepsis ruled out   Discharge Instructions   Allergies as of 12/24/2022       Reactions   Butorphanol Hives,  Swelling, Other (See Comments)   Cerebral pain, "like someone is brushing my brain with a brillo pad"   Erythromycin Base Diarrhea, Itching, Other (See Comments)   Severe diarrhea   Propoxyphene Nausea Only, Other (See Comments)   Depression, Agitation, Blurred Vision   Sumatriptan Nausea And Vomiting, Other (See Comments), Tinitus   Ringing in the ears, migraines are worse   Tetracycline Nausea Only, Other (See Comments), Tinitus   Causes ringing in ears, dizziness, migraine, nausea   Ciprofloxacin Hives, Other (See Comments), Tinitus   Headache, dizziness, ringing in the ears   Corticosteroids Other (See Comments)   12/02/2016 interview by Satira Mccallum: Oral dosing causes migraines/nausea/tinnitus. Prev tolerated IV and intranasal admin w/o difficulty.   Doxycycline Other (See Comments)   unknown   Cefixime Other (See Comments)   Headache   Gabapentin Swelling, Other (See Comments)   Throat swells/closes   Isometheptene-dichloral-apap Other (See Comments)   Unknown, mood liability   Ketorolac    12/02/2016 interview by ZOX: Oral dosing prednisone/corticosteroids causes migraines/nausea/tinnitus. Prev tolerated IV and intranasal admin w/o difficulty.   Prednisone Other (See Comments)   Migraine, dizzy, ringing in ears-can take it IV 12/02/2016 interview by [name]: Oral prednisone dosing causes migraines/nausea/tinnitus. Prev tolerated IV and intranasal admin w/o difficulty.   Promethazine Other (See Comments)   causes severe abdominal pain when taken IV; however she can take PO or IM   Tape Other (See Comments)   Adhesive tapes-patient denies        Medication List     STOP taking these medications    potassium chloride 10 MEQ tablet Commonly known as: KLOR-CON       TAKE these medications    acetaminophen 500 MG tablet Commonly known as: TYLENOL Take 1,000 mg by mouth as needed for moderate pain.   azelastine 0.1 % nasal spray Commonly known as: ASTELIN Place 2 sprays into  both nostrils 2 (two) times daily. What changed:  when to take this reasons to take this   baclofen 10 MG tablet Commonly known as: LIORESAL Take 10 mg by mouth daily as needed for muscle spasms.   CALCIUM PO Take 1 tablet by mouth in the morning and at bedtime.   cefdinir 300 MG capsule Commonly known as: OMNICEF Take 1 capsule (300 mg total) by mouth every 12 (twelve) hours for 5 days.   cholecalciferol 25 MCG (1000 UNIT) tablet Commonly known as: VITAMIN D3 Take 1,000 Units by mouth daily.   cyanocobalamin 1000 MCG tablet Commonly known as: VITAMIN B12 Take 1,000 mcg by mouth daily.   cycloSPORINE 0.05 % ophthalmic emulsion Commonly known as: RESTASIS Place 1 drop into both eyes in the morning, at noon, in the evening, and at bedtime.   dexlansoprazole 60 MG capsule Commonly known as: DEXILANT Take 1 capsule (60 mg total) by mouth daily with breakfast.   famotidine 20 MG tablet Commonly known as: Pepcid Take 1 tablet (20 mg total) by mouth at bedtime.   ferrous sulfate 325 (65 FE) MG tablet Take 650 mg by mouth daily with breakfast.   fludrocortisone 0.1 MG tablet Commonly known as: FLORINEF Take 1 tablet (0.1 mg total)  by mouth daily before supper.   Gammagard 20 GM/200ML Soln Generic drug: Immune Globulin (Human) Inject 40 g into the vein every 21 ( twenty-one) days. INFUSE 40G INTRAVENOUSLY EVERY 3 WEEKS   heparin lock flush 100 UNIT/ML Soln injection Inject 500 Units into the vein every 21 ( twenty-one) days.   ibuprofen 200 MG tablet Commonly known as: ADVIL Take 600 mg by mouth as needed for mild pain.   Kevzara 200 MG/1. Soaj Generic drug: Sarilumab Inject 1.14 mLs into the skin every 14 (fourteen) days.   levothyroxine 75 MCG tablet Commonly known as: SYNTHROID TAKE ONE (1) TABLET BY MOUTH EACH DAY BEFORE BREAKFAST ON EMPTY STOMACH What changed: See the new instructions.   magnesium oxide 400 MG tablet Commonly known as:  MAG-OX Take 400 mg by mouth daily.   midodrine 5 MG tablet Commonly known as: PROAMATINE Take 1 tablet (5 mg total) by mouth 2 (two) times daily with a meal. Breakfast and lunch What changed:  when to take this additional instructions   mupirocin ointment 2 % Commonly known as: BACTROBAN Apply 1 application. topically 2 (two) times daily.   Myrbetriq 50 MG Tb24 tablet Generic drug: mirabegron ER Take 50 mg by mouth daily.   ondansetron 8 MG tablet Commonly known as: ZOFRAN Take by mouth every 8 (eight) hours as needed for nausea or vomiting.   potassium chloride SA 20 MEQ tablet Commonly known as: KLOR-CON M Take 1 tablet (20 mEq total) by mouth once for 1 dose.   pyridoxine 100 MG tablet Commonly known as: B-6 Take 100 mg by mouth daily.   rOPINIRole 0.5 MG tablet Commonly known as: REQUIP Take 0.5 mg by mouth 3 (three) times daily.   sucralfate 1 GM/10ML suspension Commonly known as: CARAFATE Take 10 mLs (1 g total) by mouth 4 (four) times daily as needed.   tamsulosin 0.4 MG Caps capsule Commonly known as: FLOMAX Take 1 capsule (0.4 mg total) by mouth daily.   torsemide 20 MG tablet Commonly known as: DEMADEX Take 1 tablet (20 mg total) by mouth daily as needed (fluid, edema). What changed:  when to take this reasons to take this   triamcinolone cream 0.1 % Commonly known as: KENALOG Apply 1 application topically 2 (two) times daily as needed (rash).   Vitamin D (Ergocalciferol) 1.25 MG (50000 UNIT) Caps capsule Commonly known as: DRISDOL Take 1 capsule (50,000 Units total) by mouth every 7 (seven) days. What changed: additional instructions   ZINC OXIDE (TOPICAL) 10 % Crea Apply 1 application topically 2 (two) times daily as needed (rash).       Consultations: none  Procedures/Studies:  CT FOOT LEFT WO CONTRAST  Result Date: 12/06/2022 CLINICAL DATA:  Bruising and pain of the left foot. No known injury. EXAM: CT OF THE LEFT FOOT WITHOUT  CONTRAST TECHNIQUE: Multidetector CT imaging of the left foot was performed according to the standard protocol. Multiplanar CT image reconstructions were also generated. RADIATION DOSE REDUCTION: This exam was performed according to the departmental dose-optimization program which includes automated exposure control, adjustment of the mA and/or kV according to patient size and/or use of iterative reconstruction technique. COMPARISON:  None Available. FINDINGS: Bones/Joint/Cartilage Generalized osteopenia. No acute fracture or dislocation. Tiny plantar calcaneal spur. Enthesopathic changes of the Achilles tendon insertion. Normal alignment. No joint effusion. Mild osteoarthritis of the third TMT joint. Ligaments Ligaments are suboptimally evaluated by CT. Muscles and Tendons Muscles are normal. No muscle atrophy. No intramuscular fluid collection or hematoma.  Flexor, extensor, peroneal and Achilles tendons are intact. Soft tissue Hemorrhagic contusion along the dorsal aspect of the foot. No soft tissue mass. Peripheral vascular atherosclerotic disease. IMPRESSION: 1. No acute osseous injury of the left foot. 2. Hemorrhagic contusion along the dorsal aspect of the foot. Electronically Signed   By: Elige Ko M.D.   On: 12/06/2022 08:46    Subjective: -Complains of a headache, and has been having these for a while at home, over the past year.  Discharge Exam: BP (!) 140/74 (BP Location: Right Arm)   Pulse 77   Temp 97.8 F (36.6 C) (Oral)   Resp 18   Ht 5' 4.5" (1.638 m)   Wt 65.8 kg   SpO2 100%   BMI 24.50 kg/m   General: Pt is alert, awake, not in acute distress Cardiovascular: RRR, S1/S2 +, no rubs, no gallops Respiratory: CTA bilaterally, no wheezing, no rhonchi Abdominal: Soft, NT, ND, bowel sounds + Extremities: Trace edema  The results of significant diagnostics from this hospitalization (including imaging, microbiology, ancillary and laboratory) are listed below for reference.      Microbiology: Recent Results (from the past 240 hour(s))  Urine Culture     Status: Abnormal   Collection Time: 12/22/22  1:57 PM   Specimen: Urine, Random  Result Value Ref Range Status   Specimen Description   Final    URINE, RANDOM Performed at [facility], [address], [place], [place] [address]    Special Requests   Final    NONE Reflexed from [id number] Performed at [facility], [address], [place], [place] [address]    Culture >=100,000 COLONIES/mL PROTEUS MIRABILIS (A)  Final   Report Status 12/24/2022 FINAL  Final   Organism ID, Bacteria PROTEUS MIRABILIS (A)  Final      Susceptibility   Proteus mirabilis - MIC*    AMPICILLIN <=2 SENSITIVE Sensitive     CEFAZOLIN <=4 SENSITIVE Sensitive     CEFEPIME <=0.12 SENSITIVE Sensitive     CEFTRIAXONE <=0.25 SENSITIVE Sensitive     CIPROFLOXACIN <=0.25 SENSITIVE Sensitive     GENTAMICIN <=1 SENSITIVE Sensitive     IMIPENEM 8 INTERMEDIATE Intermediate     NITROFURANTOIN 128 RESISTANT Resistant     TRIMETH/SULFA <=20 SENSITIVE Sensitive     AMPICILLIN/SULBACTAM <=2 SENSITIVE Sensitive     PIP/TAZO <=4 SENSITIVE Sensitive     * >=100,000 COLONIES/mL PROTEUS MIRABILIS     Labs: Basic Metabolic Panel: Recent Labs  Lab 12/22/22 1007 12/23/22 0320 12/24/22 0823  NA 136 137 141  K 3.5 3.3* 3.2*  CL 98 105 113*  CO2 28 26 21*  GLUCOSE 92 94 102*  BUN 39* 36* 20  CREATININE 1.90* 1.70* 0.92  CALCIUM 9.2 7.8* 7.9*  MG 1.8  --   --    Liver Function Tests: Recent Labs  Lab 12/22/22 1007  AST 49*  ALT 45*  ALKPHOS 60  BILITOT 0.7  PROT 7.1  ALBUMIN 3.5   CBC: Recent Labs  Lab 12/22/22 1007  WBC 6.6  HGB 12.6  HCT 39.9  MCV 93.4  PLT 206   CBG: No results for input(s): "GLUCAP" in the last 168 hours. Hgb A1c No results for input(s): "HGBA1C" in the last 72 hours. Lipid Profile No results for input(s): "CHOL", "HDL", "LDLCALC", "TRIG", "CHOLHDL", "LDLDIRECT"  in the last 72 hours. Thyroid function studies No results for input(s): "TSH", "T4TOTAL", "T3FREE", "THYROIDAB" in the last 72 hours.  Invalid input(s): "FREET3"  Urinalysis    Component Value Date/Time   COLORURINE YELLOW 12/22/2022 1357   APPEARANCEUR HAZY (A) 12/22/2022 1357   LABSPEC 1.006 12/22/2022 1357   PHURINE 7.0 12/22/2022 1357   GLUCOSEU NEGATIVE 12/22/2022 1357   HGBUR SMALL (A) 12/22/2022 1357   BILIRUBINUR NEGATIVE 12/22/2022 1357   BILIRUBINUR N/A 03/25/2020 1533   KETONESUR NEGATIVE 12/22/2022 1357   PROTEINUR NEGATIVE 12/22/2022 1357   UROBILINOGEN negative (A) 03/25/2020 1533   NITRITE POSITIVE (A) 12/22/2022 1357   LEUKOCYTESUR LARGE (A) 12/22/2022 1357    FURTHER DISCHARGE INSTRUCTIONS:   Get Medicines reviewed and adjusted: Please take all your medications with you for your next visit with your Primary MD   Laboratory/radiological data: Please request your Primary MD to go over all hospital tests and procedure/radiological results at the follow up, please ask your Primary MD to get all Hospital records sent to his/her office.   In some cases, they will be blood work, cultures and biopsy results pending at the time of your discharge. Please request that your primary care M.D. goes through all the records of your hospital data and follows up on these results.   Also Note the following: If you experience worsening of your admission symptoms, develop shortness of breath, life threatening emergency, suicidal or homicidal thoughts you must seek medical attention immediately by calling 911 or calling your MD immediately  if symptoms less severe.   You must read complete instructions/literature along with all the possible adverse reactions/side effects for all the Medicines you take and that have been prescribed to you. Take any new Medicines after you have completely understood and accpet all the possible adverse reactions/side effects.    Do not drive when taking  Pain medications or sleeping medications (Benzodaizepines)   Do not take more than prescribed Pain, Sleep and Anxiety Medications. It is not advisable to combine anxiety,sleep and pain medications without talking with your primary care practitioner   Special Instructions: If you have smoked or chewed Tobacco  in the last 2 yrs please stop smoking, stop any regular Alcohol  and or any Recreational drug use.   Wear Seat belts while driving.   Please note: You were cared for by a hospitalist during your hospital stay. Once you are discharged, your primary care physician will handle any further medical issues. Please note that NO REFILLS for any discharge medications will be authorized once you are discharged, as it is imperative that you return to your primary care physician (or establish a relationship with a primary care physician if you do not have one) for your post hospital discharge needs so that they can reassess your need for medications and monitor your lab values.  Time coordinating discharge: 40 minutes  SIGNED:  Pamella Pert, MD, PhD 12/24/2022, 1:21 PM

## 2022-12-24 NOTE — TOC Initial Note (Signed)
Transition of Care [facility]) - Initial/Assessment Note    Patient Details  Name: Megan Brewer MRN: [id number] Date of Birth: [birth year]  [facility] CM/SW Contact:    Durenda Guthrie, RN Phone Number: 12/24/2022, 11:13 AM  Clinical Narrative:                  Patient is active with [facility], will need resumption of care orders when ready for discharge.        Patient Goals and CMS Choice            Expected Discharge Plan and Services                                              Prior Living Arrangements/Services                       Activities of Daily Living Home Assistive Devices/Equipment: Wheelchair ADL Screening (condition at time of admission) Patient's cognitive ability adequate to safely complete daily activities?: Yes Is the patient deaf or have difficulty hearing?: No Does the patient have difficulty seeing, even when wearing glasses/contacts?: No Does the patient have difficulty concentrating, remembering, or making decisions?: No Patient able to express need for assistance with ADLs?: Yes Does the patient have difficulty dressing or bathing?: No Independently performs ADLs?: No Communication: Independent Dressing (OT): Independent Grooming: Needs assistance Is this a change from baseline?: Change from baseline, expected to last >3 days Feeding: Independent Bathing: Needs assistance Is this a change from baseline?: Change from baseline, expected to last >3 days Toileting: Needs assistance Is this a change from baseline?: Change from baseline, expected to last >3days In/Out Bed: Needs assistance Is this a change from baseline?: Change from baseline, expected to last >3 days Walks in Home: Needs assistance Is this a change from baseline?: Change from baseline, expected to last >3 days Does the patient have difficulty walking or climbing stairs?: Yes Weakness of Legs: Both Weakness of Arms/Hands:  None  Permission Sought/Granted                  Emotional Assessment              Admission diagnosis:  AKI (acute kidney injury) (HCC) [N17.9] Near syncope [R55] Hypotension, unspecified hypotension type [I95.9] Orthostatic hypotension [I95.1] Patient Active Problem List   Diagnosis Date Noted   Orthostatic hypotension 12/22/2022   Near syncope 12/22/2022   Bipolar mixed affective disorder, moderate (HCC) 08/25/2022   Tremor 08/25/2022   Polyarthralgia 05/04/2022   Polymyalgia rheumatica (HCC) 03/25/2022   Thrombocytopenia, unspecified (HCC) 12/30/2021   Upper airway cough syndrome with ? VCD  06/26/2021   Precordial chest pain 06/05/2021   Moderate mitral regurgitation 06/05/2021   Essential hypertension 06/05/2021   DOE (dyspnea on exertion) 06/05/2021   Medication monitoring encounter 04/01/2021   Gram-negative bacteremia 03/03/2021   Rigors 02/04/2021   Elevated troponin 01/14/2021   Hypokalemia 01/14/2021   Headache 01/14/2021   Chest discomfort 03/22/2019   Weight loss 01/27/2019   Acquired hypothyroidism 01/27/2019   Esophagitis 01/27/2019   Intractable migraine without status migrainosus 01/27/2019   Osteoporosis 01/27/2019   Chronic sinusitis 12/28/2018   Food intolerance/GI symptoms 12/28/2018   Acute maxillary sinusitis 12/28/2018   Lower leg edema 12/20/2018   Chronic pain of both shoulders 05/24/2018   Bilateral  leg edema 05/24/2018   Chronic pain syndrome 03/31/2018   Left bundle branch block 10/28/2017   Palpitations 10/28/2017   History of gastric bypass 05/21/2017   Polypharmacy 02/11/2017   Senile nuclear sclerosis 11/26/2016   Chronic post-thoracotomy pain 04/09/2014   Chronic kidney disease 04/09/2014   Sleep disturbances 09/20/2012   Restless legs syndrome 07/07/2012   Hypertonicity of bladder 05/23/2012   Essential tremor 04/01/2012   Memory loss 04/01/2012   Incomplete emptying of bladder 01/14/2012   Urge incontinence  10/28/2011   Tension type headache 06/10/2011   Mixed urge and stress incontinence 05/28/2011   Hypogammaglobulinemia (HCC) 06/22/2010   Severe episode of recurrent major depressive disorder, without psychotic features (HCC) 06/22/2010   Hypothyroidism 02/12/2010   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/2010   ARACHNOIDITIS 01/31/2010   OBSTRUCTIVE SLEEP APNEA 01/31/2010   Chronic rhinitis 01/31/2010   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

## 2022-12-24 NOTE — Plan of Care (Signed)
Improvement in BP noted. Patient tolerated walks to the bathroom with assistance x1 without reports of dizziness or lightheadedness. VS stable. Loose bowel movement reported this shift. Notified on call provider. Diarrhea likely from antibiotics, will continue to monitor. IV fluids infusing thru PORT site, well tolerated. Coordinated ACTH lab draws with IV team nurse.   Problem: Education: Goal: Knowledge of General Education information will improve Description: Including pain rating scale, medication(s)/side effects and non-pharmacologic comfort measures Outcome: Progressing   Problem: Health Behavior/Discharge Planning: Goal: Ability to manage health-related needs will improve Outcome: Progressing   Problem: Clinical Measurements: Goal: Will remain free from infection Outcome: Progressing   Problem: Activity: Goal: Risk for activity intolerance will decrease Outcome: Progressing   Problem: Coping: Goal: Level of anxiety will decrease Outcome: Progressing   Problem: Elimination: Goal: Will not experience complications related to bowel motility Outcome: Progressing   Problem: Safety: Goal: Ability to remain free from injury will improve Outcome: Progressing   Problem: Skin Integrity: Goal: Risk for impaired skin integrity will decrease Outcome: Progressing

## 2022-12-24 NOTE — Progress Notes (Signed)
Physical Therapy Treatment Patient Details Name: Megan Brewer MRN: [id number] DOB: [birth year] Today's Date: 12/24/2022   History of Present Illness 77 yo female admitted with near syncope, recurrent orthostatic hypotension. Hx of spinal cord stimulator, bipolar d/o, orthostatic hypotension, lung ca, HF, RLS, CKD, polymyalgia rheumatica, chronic back pain, gastric bypass    PT Comments    Patient agreeable to therapy, resting in bed at start. Pt completed bed mobility and transfers at Mod I level. No LOB in standing with donning clothing. Pt ambulated ~200' with no c/o dizziness or weakness. Gait pattern with slight sway and low guard posture to increase stability. No overt LOB noted throughout and pt denies any dizziness, SOB, or fatigue with gait. EOS pt returned to supine in bed and call bell within reach. Pt requesting HHPT. Will continue to progress as able.     Recommendations for follow up therapy are one component of a multi-disciplinary discharge planning process, led by the attending physician.  Recommendations may be updated based on patient status, additional functional criteria and insurance authorization.  Follow Up Recommendations       Assistance Recommended at Discharge Frequent or constant Supervision/Assistance  Patient can return home with the following Assistance with cooking/housework;Assist for transportation;Help with stairs or ramp for entrance;A little help with bathing/dressing/bathroom;A little help with walking and/or transfers   Equipment Recommendations  None recommended by PT    Recommendations for Other Services       Precautions / Restrictions Precautions Precautions: Fall Precaution Comments: monitor BP; orthostatic hypotension Restrictions Weight Bearing Restrictions: No     Mobility  Bed Mobility Overal bed mobility: Modified Independent                  Transfers Overall transfer level: Modified independent                       Ambulation/Gait Ambulation/Gait assistance: Min guard, Supervision Gait Distance (Feet): 200 Feet Assistive device: None Gait Pattern/deviations: Step-through pattern, Decreased stride length Gait velocity: decr     General Gait Details: guarding throughout and progressed to supervision for safety, pt with sway and holding UE's in low guard for stability.   Stairs             Wheelchair Mobility    Modified Rankin (Stroke Patients Only)       Balance Overall balance assessment: Needs assistance Sitting-balance support: Feet supported Sitting balance-Leahy Scale: Good Sitting balance - Comments: pt dressing lower/upper body in sitting and standing   Standing balance support: During functional activity Standing balance-Leahy Scale: Fair Standing balance comment: pt dressing lower/upper body in sitting and standing                            Cognition Arousal/Alertness: Awake/alert Behavior During Therapy: WFL for tasks assessed/performed Overall Cognitive Status: Within Functional Limits for tasks assessed                                 General Comments: pt likes to joke        Exercises      General Comments        Pertinent Vitals/Pain Pain Assessment Pain Assessment: No/denies pain Pain Intervention(s): Monitored during session, Repositioned    Home Living  Prior Function            PT Goals (current goals can now be found in the care plan section) Acute Rehab PT Goals PT Goal Formulation: With patient/family Time For Goal Achievement: 01/06/23 Potential to Achieve Goals: Good Progress towards PT goals: Progressing toward goals    Frequency    Min 1X/week      PT Plan Current plan remains appropriate    Co-evaluation              AM-PAC PT "6 Clicks" Mobility   Outcome Measure  Help needed turning from your back to your side while in a flat bed  without using bedrails?: None Help needed moving from lying on your back to sitting on the side of a flat bed without using bedrails?: None Help needed moving to and from a bed to a chair (including a wheelchair)?: None Help needed standing up from a chair using your arms (e.g., wheelchair or bedside chair)?: None Help needed to walk in hospital room?: A Little Help needed climbing 3-5 steps with a railing? : A Little 6 Click Score: 22    End of Session Equipment Utilized During Treatment: Gait belt Activity Tolerance: Patient tolerated treatment well Patient left: in bed;with call bell/phone within reach;with family/visitor present Nurse Communication: Mobility status PT Visit Diagnosis: Difficulty in walking, not elsewhere classified (R26.2)     Time: 1537-1600 PT Time Calculation (min) (ACUTE ONLY): 23 min  Charges:  $Gait Training: 8-22 mins $Therapeutic Activity: 8-22 mins                     Wynn Maudlin, DPT Acute Rehabilitation Services Office [phone]  12/24/22 4:13 PM

## 2022-12-25 ENCOUNTER — Telehealth: Payer: Self-pay | Admitting: *Deleted

## 2022-12-25 ENCOUNTER — Encounter: Payer: Self-pay | Admitting: *Deleted

## 2022-12-25 ENCOUNTER — Other Ambulatory Visit: Payer: Medicare PPO

## 2022-12-25 NOTE — Transitions of Care (Post Inpatient/ED Visit) (Signed)
12/25/2022  Name: Megan Brewer MRN: [id number] DOB: Nov 21, 1945  Today's TOC FU Call Status: Today's TOC FU Call Status:: Successful TOC FU Call Competed TOC FU Call Complete Date: 12/25/22  Transition Care Management Follow-up Telephone Call Date of Discharge: 12/24/22 Discharge Facility: [facility]) Type of Discharge: Inpatient Admission Primary Inpatient Discharge Diagnosis:: ongoing orthostatic hypotension with near syncope How have you been since you were released from the hospital?: Better ("Overall doing okay.  Went to have labs drawn this morning and was told that insurance refused; I am a bit confused about how I was told to take the potassium when I left the hospital- they only gave me 3 doses and my potassium has been low all along") Any questions or concerns?: Yes Patient Questions/Concerns:: ongoing hypokalemia with limited amount of supplemental K+ post-hospital discharge Patient Questions/Concerns Addressed: Notified Provider of Patient Questions/Concerns, Other: (provided review of labs from yesterday values at hospital (K+ 3.2 on 12/24/22); education provided around sources of K+ in food; made PCP aware- aong with insurance refusal to have lab draw completed today as ordered)  Items Reviewed: Did you receive and understand the discharge instructions provided?: Yes (thoroughly reviewed with patient who verbalizes good understanding of same) Medications obtained,verified, and reconciled?: Yes (Medications Reviewed) (Full medication reconciliation/ review completed; no discrepancies identified; confirmed patient obtained/ is taking all newly Rx'd medications as instructed; self-manages medications; questions/ concerns around medications today shared with PCP) Any new allergies since your discharge?: No Dietary orders reviewed?: Yes Type of Diet Ordered:: "As healthy as possible" Do you have support at home?: Yes People in Home: spouse Name of Support/Comfort Primary  Source: Reports essentially independent in self-care activities; spouse assists as/ if needed/ indicated  Medications Reviewed Today: Medications Reviewed Today     Reviewed by Michaela Corner, RN (Registered Nurse) on 12/25/22 at 1128  Med List Status: <None>   Medication Order Taking? Sig Documenting Provider Last Dose Status Informant  acetaminophen (TYLENOL) 500 MG tablet [id number] Yes Take 1,000 mg by mouth as needed for moderate pain. [provider] Taking Active Self           Med Note (CARD, AMY L   Tue Dec 22, 2022  6:25 PM) PRN  azelastine (ASTELIN) 0.1 % nasal spray [id number] Yes Place 2 sprays into both nostrils 2 (two) times daily.  Patient taking differently: Place 2 sprays into both nostrils as needed for rhinitis or allergies.   Hetty Blend, FNP Taking Active Self  baclofen (LIORESAL) 10 MG tablet [id number] Yes Take 10 mg by mouth daily as needed for muscle spasms. [provider] Taking Active Self           Med Note Jomarie Longs, REGEENA   Tue Feb 04, 2021  5:11 PM)    CALCIUM PO [id number] Yes Take 1 tablet by mouth in the morning and at bedtime. [provider] Taking Active Self           Med Note Elmon Kirschner, ASHLEY A   Tue Dec 22, 2022  3:10 PM) Supposed to have mammogram in 2 days so should not be taking this week  cefdinir (OMNICEF) 300 MG capsule [id number] Yes Take 1 capsule (300 mg total) by mouth every 12 (twelve) hours for 5 days. Leatha Gilding, MD Taking Active   cholecalciferol (VITAMIN D3) 25 MCG (1000 UNIT) tablet [id number] Yes Take 1,000 Units by mouth daily. [provider] Taking Active Self  cycloSPORINE (RESTASIS) 0.05 % ophthalmic emulsion [id number] Yes  Place 1 drop into both eyes in the morning, at noon, in the evening, and at bedtime. [provider] Taking Active Self           Med Note Jomarie Longs, REGEENA   Tue Feb 04, 2021  5:11 PM)    dexlansoprazole (DEXILANT) 60 MG capsule [id number] Yes Take 1 capsule (60 mg  total) by mouth daily with breakfast. Monica Becton, Amy S, PA-C Taking Active Self  famotidine (PEPCID) 20 MG tablet [id number] Yes Take 1 tablet (20 mg total) by mouth at bedtime. Esterwood, Amy S, PA-C Taking Active Self  ferrous sulfate 325 (65 FE) MG tablet [id number] Yes Take 650 mg by mouth daily with breakfast. [provider] Taking Active Self           Med Note Dione Housekeeper, EBONY J   Thu Oct 09, 2021 11:30 AM)    fludrocortisone (FLORINEF) 0.1 MG tablet [id number] Yes Take 1 tablet (0.1 mg total) by mouth daily before supper. Leatha Gilding, MD Taking Active   GAMMAGARD 20 GM/200ML SOLN [id number] Yes Inject 40 g into the vein every 21 ( twenty-one) days. INFUSE 40G INTRAVENOUSLY EVERY 3 WEEKS [provider] Taking Active Self  heparin lock flush 100 UNIT/ML SOLN injection [id number] Yes Inject 500 Units into the vein every 21 ( twenty-one) days. [provider] Taking Active Self  ibuprofen (ADVIL) 200 MG tablet [id number] Yes Take 600 mg by mouth as needed for mild pain. [provider] Taking Active Self  KEVZARA 200 MG/1. SOAJ [id number] Yes Inject 1.14 mLs into the skin every 14 (fourteen) days. [provider] Taking Active Self           Med Note (CARD, AMY L   Tue Dec 22, 2022  6:35 PM) Due Thursday of this week. 12/24/22  levothyroxine (SYNTHROID) 75 MCG tablet [id number] Yes TAKE ONE (1) TABLET BY MOUTH EACH DAY BEFORE BREAKFAST ON EMPTY STOMACH  Patient taking differently: Take 75 mcg by mouth daily before breakfast. 30 MINUTES BEFORE BREAKFAST ON EMPTY STOMACH.   Sharlene Dory, DO Taking Active Self  magnesium oxide (MAG-OX) 400 MG tablet [id number] Yes Take 400 mg by mouth daily.  [provider] Taking Active Self  midodrine (PROAMATINE) 5 MG tablet [id number] Yes Take 1 tablet (5 mg total) by mouth 2 (two) times daily with a meal. Breakfast and lunch Leatha Gilding, MD Taking Active   mupirocin ointment  (BACTROBAN) 2 % [id number] Yes Apply 1 application. topically 2 (two) times daily. Sharon Seller, NP Taking Active Self           Med Note (CARD, AMY L   Tue Dec 22, 2022  6:37 PM) PRN  MYRBETRIQ 50 MG TB24 tablet [id number] Yes Take 50 mg by mouth daily. [provider] Taking Active Self  ondansetron (ZOFRAN) 8 MG tablet [id number] Yes Take by mouth every 8 (eight) hours as needed for nausea or vomiting. Sharlene Dory, DO Taking Active Self           Med Note Jonnie Kind Dec 17, 2022  9:35 AM) 12/17/22: Recently prescribed at hospital discharge from [facility] on 12/15/22; reports using prn  potassium chloride SA (KLOR-CON M) 20 MEQ tablet [id number]  Take 1 tablet (20 mEq total) by mouth once for 1 dose. Leatha Gilding, MD  Expired 12/24/22 2359            Med Note Michaela Corner   Fri Dec 25, 2022 11:20 AM) 12/25/22: Reviewed with patient: AVS from hospital discharge says patient is to both START and STOP K+-- reports she has a "different" K+ at her home that she was provided only 3 pills for at time of hospital discharge on 12/24/22   pyridoxine (B-6) 100 MG tablet [id number] Yes Take 100 mg by mouth daily. Sharlene Dory, DO Taking Active Self  rOPINIRole (REQUIP) 0.5 MG tablet [id number] Yes Take 0.5 mg by mouth 3 (three) times daily. Sharlene Dory, DO Taking Active Self  sucralfate (CARAFATE) 1 GM/10ML suspension [id number] Yes Take 10 mLs (1 g total) by mouth 4 (four) times daily as needed. Sharlene Dory, DO Taking Active Self           Med Note (CARD, AMY L   Tue Dec 22, 2022  6:42 PM) PRN  tamsulosin [facility]) 0.4 MG CAPS capsule [id number] Yes Take 1 capsule (0.4 mg total) by mouth daily. Burnadette Pop, MD Taking Active Self  torsemide (DEMADEX) 20 MG tablet [id number] Yes Take 1 tablet (20 mg total) by mouth daily as needed (fluid, edema). Leatha Gilding, MD Taking Active   triamcinolone cream (KENALOG) 0.1 % [id number] Yes Apply 1  application  topically 2 (two) times daily as needed (rash). [provider] Taking Active Self           Med Note (CARD, AMY L   Tue Dec 22, 2022  6:44 PM) PRN  vitamin B-12 (CYANOCOBALAMIN) 1000 MCG tablet [id number] Yes Take 1,000 mcg by mouth daily. [provider] Taking Active Self  Vitamin D, Ergocalciferol, (DRISDOL) 1.25 MG (50000 UNIT) CAPS capsule [id number] Yes Take 1 capsule (50,000 Units total) by mouth every 7 (seven) days.  Patient taking differently: Take 50,000 Units by mouth every 7 (seven) days. Thursdays   Horton Chin, MD Taking Active Self           Med Note Elmon Kirschner, ASHLEY A   Tue Dec 22, 2022  3:18 PM)    ZINC OXIDE, TOPICAL, 10 % CREA [id number] Yes Apply 1 application topically 2 (two) times daily as needed (rash). Glade Lloyd, MD Taking Active Self           Med Note (CARD, AMY L   Tue Dec 22, 2022  6:44 PM) PRN            Home Care and Equipment/Supplies: Were Home Health Services Ordered?: Yes Name of Home Health Agency:: [facility] was active prior to this hospital discharge: confirmed they remain active with services to resume "next week" Has Agency set up a time to come to your home?: Yes First Home Health Visit Date:  ("they have called and said they would be here next week") Any new equipment or medical supplies ordered?: No  Functional Questionnaire: Do you need assistance with bathing/showering or dressing?: No (husband assists with all care activities as indicated/ needed) Do you need assistance with meal preparation?: No (husband assists with all care activities as indicated/ needed) Do you need assistance with eating?: No Do you have difficulty maintaining continence: No (husband assists with all care activities as indicated/ needed) Do you need assistance with getting out of bed/getting out of a chair/moving?: No (husband assists with all care activities as indicated/ needed) Do you have difficulty managing or taking  your medications?: No (husband assists with all care activities as indicated/ needed)  Follow up appointments reviewed: PCP Follow-up appointment confirmed?: Yes Date of PCP follow-up appointment?: 01/01/23 Follow-up Provider: PCP  Specialist Hospital Follow-up appointment confirmed?: NA Do you need transportation to your follow-up appointment?: No Do you understand care options if your condition(s) worsen?: Yes-patient verbalized understanding  SDOH Interventions Today    Flowsheet Row Most Recent Value  SDOH Interventions   Food Insecurity Interventions Intervention Not Indicated  Transportation Interventions Intervention Not Indicated  [reports husband provides transportation]      TOC Interventions Today    Flowsheet Row Most Recent Value  TOC Interventions   TOC Interventions Discussed/Reviewed TOC Interventions Discussed  [confirmed has my direct contact information should questions/ concerns/ needs arise post-TOC call, prior to RN CM telephone visit]      Interventions Today    Flowsheet Row Most Recent Value  Chronic Disease   Chronic disease during today's visit Other  [ongoing orthostatic hypotension with near-syncope]  General Interventions   General Interventions Discussed/Reviewed General Interventions Discussed, Labs, Doctor Visits, Durable Medical Equipment (DME), Communication with  Labs --  [K+ levels reviewed- confirmed ongoing mild hypokalemia]  Doctor Visits Discussed/Reviewed Doctor Visits Discussed, PCP  Durable Medical Equipment (DME) Wheelchair  [phone], patient reports she has an Mining engineer wheelchair which she is learning to use,  otherwise continues to report that she does not otherwise use assistive devices because they "get in the way and make me feel like I am going to fall"]  Wheelchair Motorized  PCP/Specialist Visits Compliance with follow-up visit  Communication with PCP/Specialists, RN  Education Interventions   Education Provided Provided  Education  [employer] sources of potassium]  Provided Verbal Education On Nutrition  Nutrition Interventions   Nutrition Discussed/Reviewed Nutrition Discussed  Pharmacy Interventions   Pharmacy Dicussed/Reviewed Pharmacy Topics Discussed  [Full medication review with updating medication list in EHR per patient report]      Caryl Pina, RN, BSN, CCRN Alumnus RN CM Care Coordination/ Transition of Care- [facility] Care Management [phone]: direct office

## 2022-12-26 DIAGNOSIS — D803 Selective deficiency of immunoglobulin G [IgG] subclasses: Secondary | ICD-10-CM | POA: Diagnosis not present

## 2022-12-26 DIAGNOSIS — G259 Extrapyramidal and movement disorder, unspecified: Secondary | ICD-10-CM | POA: Diagnosis not present

## 2022-12-26 DIAGNOSIS — M549 Dorsalgia, unspecified: Secondary | ICD-10-CM | POA: Diagnosis not present

## 2022-12-26 DIAGNOSIS — G8929 Other chronic pain: Secondary | ICD-10-CM | POA: Diagnosis not present

## 2022-12-26 DIAGNOSIS — G2581 Restless legs syndrome: Secondary | ICD-10-CM | POA: Diagnosis not present

## 2022-12-26 DIAGNOSIS — N189 Chronic kidney disease, unspecified: Secondary | ICD-10-CM | POA: Diagnosis not present

## 2022-12-26 DIAGNOSIS — G250 Essential tremor: Secondary | ICD-10-CM | POA: Diagnosis not present

## 2022-12-26 DIAGNOSIS — C3490 Malignant neoplasm of unspecified part of unspecified bronchus or lung: Secondary | ICD-10-CM | POA: Diagnosis not present

## 2022-12-26 DIAGNOSIS — M353 Polymyalgia rheumatica: Secondary | ICD-10-CM | POA: Diagnosis not present

## 2022-12-27 DIAGNOSIS — M542 Cervicalgia: Secondary | ICD-10-CM | POA: Diagnosis not present

## 2022-12-27 DIAGNOSIS — M62838 Other muscle spasm: Secondary | ICD-10-CM | POA: Diagnosis not present

## 2022-12-28 ENCOUNTER — Telehealth: Payer: Self-pay | Admitting: Family Medicine

## 2022-12-28 DIAGNOSIS — D803 Selective deficiency of immunoglobulin G [IgG] subclasses: Secondary | ICD-10-CM | POA: Diagnosis not present

## 2022-12-28 DIAGNOSIS — G250 Essential tremor: Secondary | ICD-10-CM | POA: Diagnosis not present

## 2022-12-28 DIAGNOSIS — C3490 Malignant neoplasm of unspecified part of unspecified bronchus or lung: Secondary | ICD-10-CM | POA: Diagnosis not present

## 2022-12-28 DIAGNOSIS — G259 Extrapyramidal and movement disorder, unspecified: Secondary | ICD-10-CM | POA: Diagnosis not present

## 2022-12-28 DIAGNOSIS — G8929 Other chronic pain: Secondary | ICD-10-CM | POA: Diagnosis not present

## 2022-12-28 DIAGNOSIS — M353 Polymyalgia rheumatica: Secondary | ICD-10-CM | POA: Diagnosis not present

## 2022-12-28 DIAGNOSIS — G2581 Restless legs syndrome: Secondary | ICD-10-CM | POA: Diagnosis not present

## 2022-12-28 DIAGNOSIS — N189 Chronic kidney disease, unspecified: Secondary | ICD-10-CM | POA: Diagnosis not present

## 2022-12-28 DIAGNOSIS — M549 Dorsalgia, unspecified: Secondary | ICD-10-CM | POA: Diagnosis not present

## 2022-12-28 NOTE — Telephone Encounter (Signed)
Called HHRN informed of verbal ok per PCP 

## 2022-12-28 NOTE — Telephone Encounter (Signed)
Caller/Agency: Archie Patten from [facility] Callback Number: [phone] Requesting OT/PT/Skilled Nursing/Social Work/Speech Therapy: nursing Frequency: 1 w 4   Pt has questions regarding her potassium, just wanted to give MD a heads up since pt has an appt tomorrow.

## 2022-12-29 ENCOUNTER — Other Ambulatory Visit: Payer: Self-pay | Admitting: Family Medicine

## 2022-12-29 ENCOUNTER — Encounter: Payer: Self-pay | Admitting: Family Medicine

## 2022-12-29 ENCOUNTER — Ambulatory Visit: Payer: Medicare PPO | Admitting: Family Medicine

## 2022-12-29 VITALS — BP 102/62 | HR 75 | Temp 97.6°F | Ht 64.5 in | Wt 143.0 lb

## 2022-12-29 DIAGNOSIS — R531 Weakness: Secondary | ICD-10-CM

## 2022-12-29 DIAGNOSIS — E876 Hypokalemia: Secondary | ICD-10-CM

## 2022-12-29 DIAGNOSIS — R195 Other fecal abnormalities: Secondary | ICD-10-CM

## 2022-12-29 DIAGNOSIS — G909 Disorder of the autonomic nervous system, unspecified: Secondary | ICD-10-CM | POA: Diagnosis not present

## 2022-12-29 LAB — CBC
HCT: 39.2 % (ref 36.0–46.0)
Hemoglobin: 13.1 g/dL (ref 12.0–15.0)
MCHC: 33.5 g/dL (ref 30.0–36.0)
MCV: 91.8 fl (ref 78.0–100.0)
Platelets: 178 10*3/uL (ref 150.0–400.0)
RBC: 4.27 Mil/uL (ref 3.87–5.11)
RDW: 15 % (ref 11.5–15.5)
WBC: 6.4 10*3/uL (ref 4.0–10.5)

## 2022-12-29 LAB — COMPREHENSIVE METABOLIC PANEL
ALT: 61 U/L — ABNORMAL HIGH (ref 0–35)
AST: 52 U/L — ABNORMAL HIGH (ref 0–37)
Albumin: 3.4 g/dL — ABNORMAL LOW (ref 3.5–5.2)
Alkaline Phosphatase: 63 U/L (ref 39–117)
BUN: 22 mg/dL (ref 6–23)
CO2: 28 mEq/L (ref 19–32)
Calcium: 9.9 mg/dL (ref 8.4–10.5)
Chloride: 100 mEq/L (ref 96–112)
Creatinine, Ser: 1.47 mg/dL — ABNORMAL HIGH (ref 0.40–1.20)
GFR: 34.97 mL/min — ABNORMAL LOW (ref >60.00)
Glucose, Bld: 93 mg/dL (ref 70–99)
Potassium: 3.3 mEq/L — ABNORMAL LOW (ref 3.5–5.1)
Sodium: 139 mEq/L (ref 135–145)
Total Bilirubin: 0.3 mg/dL (ref 0.2–1.2)
Total Protein: 7.7 g/dL (ref 6.0–8.3)

## 2022-12-29 LAB — MAGNESIUM: Magnesium: 1.7 mg/dL (ref 1.5–2.5)

## 2022-12-29 MED ORDER — POTASSIUM CHLORIDE ER 10 MEQ PO TBCR: 20.0000 meq | EXTENDED_RELEASE_TABLET | Freq: Two times a day (BID) | ORAL | 1 refills | Status: DC

## 2022-12-29 NOTE — Progress Notes (Signed)
Chief Complaint  Patient presents with   hospital followup    Subjective: Patient is a 77 y.o. female here for hosp f/u. Here w spouse.  Patient was admitted to [facility] hospital on 12/22/2022 and discharged on 01/04/2023 for hypotension.  Since that time, blood pressures have been stable.  She is taking midodrine 5 mg twice daily and fludrocortisone 0.1 mg daily.  She felt great until Sunday when she started to feel weak.  Of note, she was only given 3 days worth of potassium and has not taken any since then.  Swelling is gone down but she is still up 8 pounds from her dry weight.  No shortness of breath or new pain.  She did have a large loose stool yesterday but bowel movements are improving.  Past Medical History:  Diagnosis Date   Anemia    Arachnoiditis    Bipolar disorder (HCC)    Cataract    removed   Chronic post-thoracotomy pain    CKD (chronic kidney disease)    GERD (gastroesophageal reflux disease)    Hypogammaglobulinemia (HCC)    Hypotension    Hypothyroid    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HCC)    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HCC)    Lung cancer (HCC) 2011, 2014   Memory loss    Migraine    Opioid abuse (HCC)    Osteoporosis    PMR (polymyalgia rheumatica) (HCC) 03/17/2022   Presence of neurostimulator    Restless legs    Sleep apnea     Objective: BP 102/62 (BP Location: Left Arm, Patient Position: Sitting, Cuff Size: Normal)   Pulse 75   Temp 97.6 F (36.4 C) (Oral)   Ht 5' 4.5" (1.638 m)   Wt 143 lb (64.9 kg)   SpO2 98%   BMI 24.17 kg/m  General: Awake Heart: RRR, no LE edema Lungs: CTAB, no rales, wheezes or rhonchi. No accessory muscle use Psych: Age appropriate judgment and insight, normal mood  Assessment and Plan: Hypokalemia - Plan: Comprehensive metabolic panel, Magnesium  Loose stools - Plan: CBC  Weakness - Plan: potassium chloride (KLOR-CON) 10 MEQ tablet  Check above. Could be secondary to antibiotics but this is improving  and she had 1 dose left. For now, she will go back on her potassium 20 mill equivalents twice daily.  We may need to increase this as I do suspect this is the main cause for her weakness. She was diagnosed with dysautonomia in the hospital and is wondering who she could see to treat this. She does not want to go back to Dr. Allena Katz. Could try [facility] or [facility].  The patient and her spouse voiced understanding and agreement to the plan.  I spent 45 minutes discussing the above plans in addition to reviewing her chart and the same day of the visit.  Jilda Roche Rulo, DO 12/29/22  1:06 PM

## 2022-12-30 ENCOUNTER — Telehealth: Payer: Self-pay

## 2022-12-30 DIAGNOSIS — E876 Hypokalemia: Secondary | ICD-10-CM | POA: Diagnosis not present

## 2022-12-30 DIAGNOSIS — R531 Weakness: Secondary | ICD-10-CM | POA: Diagnosis not present

## 2022-12-30 DIAGNOSIS — I10 Essential (primary) hypertension: Secondary | ICD-10-CM | POA: Diagnosis not present

## 2022-12-30 DIAGNOSIS — R0902 Hypoxemia: Secondary | ICD-10-CM | POA: Diagnosis not present

## 2022-12-30 DIAGNOSIS — R5383 Other fatigue: Secondary | ICD-10-CM | POA: Diagnosis not present

## 2022-12-30 DIAGNOSIS — I959 Hypotension, unspecified: Secondary | ICD-10-CM | POA: Diagnosis not present

## 2022-12-30 DIAGNOSIS — I447 Left bundle-branch block, unspecified: Secondary | ICD-10-CM | POA: Diagnosis not present

## 2022-12-30 DIAGNOSIS — R197 Diarrhea, unspecified: Secondary | ICD-10-CM | POA: Diagnosis not present

## 2022-12-30 NOTE — Telephone Encounter (Signed)
BP --30 mins ago was 152/79. Spoke to the patients. She is not feeling much better. She is drinking sweet tea, cheer wine and Ritz crackers.

## 2022-12-30 NOTE — Telephone Encounter (Signed)
Called informed of PCP instructions. Patient verbalized understanding.

## 2022-12-30 NOTE — Telephone Encounter (Signed)
Initial Comment Caller states she has been up since midnight with stomach issues. Caller states she took 3 of her b/p medications at once and she is concerned. Translation No Nurse Assessment Nurse: Alexander Mt, RN, Nicholaus Bloom Date/Time (Eastern Time): 12/30/2022 7:57:58 AM Confirm and document reason for call. If symptomatic, describe symptoms. ---Caller states she has been up since midnight with stomach issues. Caller states she took 3 of her b/ p medications 2 min ago. Midodrine 5mg (2 pills), fludrocortisone Acetate 0.1mg , Does the patient have any new or worsening symptoms? ---Yes Will a triage be completed? ---Yes Related visit to physician within the last 2 weeks? ---No Does the PT have any chronic conditions? (i.e. diabetes, asthma, this includes High risk factors for pregnancy, etc.) ---Yes List chronic conditions. ---BP issues, osteoporosis, Is this a behavioral health or substance abuse call? ---No Guidelines Guideline Title Affirmed Question Affirmed Notes Nurse Date/Time (Eastern Time) Poisoning MORE THAN A DOUBLE DOSE of a prescription or overthe-counter (OTC) drug Alexander Mt, RN, Nicholaus Bloom 12/30/2022 8:02:48 AM PLEASE NOTE: All timestamps contained within this report are represented as [place] Standard Time. CONFIDENTIALTY NOTICE: This fax transmission is intended only for the addressee. It contains information that is legally privileged, confidential or otherwise protected from use or disclosure. If you are not the intended recipient, you are strictly prohibited from reviewing, disclosing, copying using or disseminating any of this information or taking any action in reliance on or regarding this information. If you have received this fax in error, please notify us immediately by telephone so that we can arrange for its return to [place]. Phone: [phone], Toll-Free: [phone], Fax: [phone] Page: 2 of 2 Call Id: [id number] Disp. Time Lamount Cohen Time) Disposition Final  User 12/30/2022 7:56:51 AM Send to Urgent Olena Mater 12/30/2022 8:04:51 AM Call Poison Center Now Yes Alexander Mt, RN, Nicholaus Bloom Final Disposition 12/30/2022 8:04:51 AM Call Poison Center Now Yes Alexander Mt, RN, Prentiss Bells Disagree/Comply Comply Caller Understands Yes PreDisposition Did not know what to do Care Advice Given Per Guideline CALL POISON CENTER NOW: * You need to call the [facility] now. [facility] advice is a free service. [place] [facility] NUMBER: [facility]: [phone] * This number will automatically connect you with your local poison center. CARE ADVICE given per Poisoning (Adult) guideline

## 2022-12-30 NOTE — Telephone Encounter (Signed)
Pt returned call requesting to speak to Robin. She sounded a little weak. Pt already spoke to triage this morning about her blood pressure fluctuating. Please call back to discuss.

## 2022-12-30 NOTE — Telephone Encounter (Signed)
Called the patient back and BP was 117/56 and before that was 59/40.Marland Kitchen She says she is not feeling any better.  Still feeling weak  and is too weak to get out of her chair.  The patient was not alone her PT was there and I spoke to the PT and told her to please call 911.  She agreed to call them for the patient.  PT stated the patient was very pale.  Patients husband was in a meeting today and not able to contact him.

## 2022-12-31 ENCOUNTER — Telehealth: Payer: Self-pay | Admitting: Family Medicine

## 2022-12-31 ENCOUNTER — Ambulatory Visit: Payer: Self-pay

## 2022-12-31 ENCOUNTER — Other Ambulatory Visit: Payer: Self-pay | Admitting: Sports Medicine

## 2022-12-31 ENCOUNTER — Ambulatory Visit: Payer: Medicare PPO

## 2022-12-31 DIAGNOSIS — G250 Essential tremor: Secondary | ICD-10-CM | POA: Diagnosis not present

## 2022-12-31 DIAGNOSIS — D803 Selective deficiency of immunoglobulin G [IgG] subclasses: Secondary | ICD-10-CM | POA: Diagnosis not present

## 2022-12-31 DIAGNOSIS — C3490 Malignant neoplasm of unspecified part of unspecified bronchus or lung: Secondary | ICD-10-CM | POA: Diagnosis not present

## 2022-12-31 DIAGNOSIS — M25552 Pain in left hip: Secondary | ICD-10-CM

## 2022-12-31 DIAGNOSIS — I447 Left bundle-branch block, unspecified: Secondary | ICD-10-CM | POA: Diagnosis not present

## 2022-12-31 DIAGNOSIS — G2581 Restless legs syndrome: Secondary | ICD-10-CM | POA: Diagnosis not present

## 2022-12-31 DIAGNOSIS — N189 Chronic kidney disease, unspecified: Secondary | ICD-10-CM | POA: Diagnosis not present

## 2022-12-31 DIAGNOSIS — G8929 Other chronic pain: Secondary | ICD-10-CM | POA: Diagnosis not present

## 2022-12-31 DIAGNOSIS — G259 Extrapyramidal and movement disorder, unspecified: Secondary | ICD-10-CM | POA: Diagnosis not present

## 2022-12-31 DIAGNOSIS — M353 Polymyalgia rheumatica: Secondary | ICD-10-CM | POA: Diagnosis not present

## 2022-12-31 DIAGNOSIS — M549 Dorsalgia, unspecified: Secondary | ICD-10-CM | POA: Diagnosis not present

## 2022-12-31 NOTE — Patient Instructions (Signed)
Visit Information  Thank you for taking time to visit with me today. Please don't hesitate to contact me if I can be of assistance to you.   Following are the goals we discussed today:  Continue to monitor Blood pressure and contact provider if outside of recommended range Continue to take medications as prescribed Continue to attend provider visits as scheduled Continue to work with home health agency as recommended  Goals Addressed             This Visit's Progress    continue to improve post hospitalization       Interventions Today    Flowsheet Row Most Recent Value  Chronic Disease   Chronic disease during today's visit Other  [ongoing orthostatic hypotension with near-syncope]  General Interventions   General Interventions Discussed/Reviewed General Interventions Discussed  Doctor Visits Discussed/Reviewed Doctor Visits Discussed, PCP  Durable Medical Equipment (DME) Wheelchair  Wheelchair Motorized  PCP/Specialist Visits Compliance with follow-up visit  Exercise Interventions   Exercise Discussed/Reviewed Exercise Discussed  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 advised patient to continue to follow up with provider with any worsening condition, questions or concerns.]  Pharmacy Interventions   Pharmacy Dicussed/Reviewed Pharmacy Topics Discussed  Safety Interventions   Safety Discussed/Reviewed Fall Risk, Safety Discussed  [fall prevention strategies discussed. reports she also will elevated legs above heart push fluids, eating protein]            Our next appointment is by telephone on 01/15/23 at 10:30 am.  Please call the care guide team at [phone] if you need to cancel or reschedule your appointment.   If you are experiencing a Mental Health or Behavioral Health Crisis or need someone to talk to, please call the Suicide and  Crisis Lifeline: 109  Kathyrn Sheriff, RN, MSN, BSN, CCM [facility] Care Coordinator [phone]

## 2022-12-31 NOTE — Patient Outreach (Signed)
  Care Coordination   Initial Visit Note   12/31/2022 Name: Megan Brewer MRN: [id number] DOB: [birth year]  Megan Brewer is a 77 y.o. year old female who sees Megan Brewer, Megan Roche, DO for primary care. I spoke with  Megan Brewer by phone today.  What matters to the patients health and wellness today?  Admissions due to hypotension; dysautonomia. ED visit on 12/30/22 due to low blood pressure. Megan Brewer reports she is taking medications as prescribe(midodrine and fludrocortisone). She states she has started potassium supplement as prescribed by PCP. She continues to keep track of her blood pressure and reports it is sometimes up and sometimes down. She states home health involved and reports the nurse is scheduled to see her today. She is without questions or concerns at this time.   Goals Addressed             This Visit's Progress    continue to improve post hospitalization       Interventions Today    Flowsheet Row Most Recent Value  Chronic Disease   Chronic disease during today's visit Other  [ongoing orthostatic hypotension with near-syncope]  General Interventions   General Interventions Discussed/Reviewed General Interventions Discussed  Doctor Visits Discussed/Reviewed Doctor Visits Discussed, PCP  Durable Medical Equipment (DME) Wheelchair  Wheelchair Motorized  PCP/Specialist Visits Compliance with follow-up visit  Exercise Interventions   Exercise Discussed/Reviewed Exercise Discussed  [active with home health]  Education Interventions   Provided Verbal Education On When to see the doctor, Medication  [advised to discuss BP parameters with provider. advised patient to continue to follow up with provider with any worsening condition, questions or concerns.]  Pharmacy Interventions   Pharmacy Dicussed/Reviewed Pharmacy Topics Discussed  Safety Interventions   Safety Discussed/Reviewed Fall Risk, Safety Discussed  [fall prevention strategies discussed.  reports she also will elevated legs above heart push fluids, eating protein]            SDOH assessments and interventions completed:  No recently completed. No changes.   Care Coordination Interventions:  Yes, provided   Follow up plan: Follow up call scheduled for 01/15/23    Encounter Outcome:  Pt. Visit Completed   Kathyrn Sheriff, RN, MSN, BSN, CCM [facility] Care Coordinator [phone]

## 2022-12-31 NOTE — Telephone Encounter (Signed)
Megan Brewer with [facility] called to advise they faxed a form over to [place] on 11/25/22 and they haven't received it yet. She said the form needs to be signed, dated and faxed back. Did not see any indication of a form being received so she will fax it back to [place]. Please fax back

## 2022-12-31 NOTE — Telephone Encounter (Signed)
Forms printed and placed in PCP folder

## 2023-01-01 ENCOUNTER — Emergency Department (HOSPITAL_COMMUNITY): Payer: Medicare PPO

## 2023-01-01 ENCOUNTER — Telehealth: Payer: Self-pay

## 2023-01-01 ENCOUNTER — Telehealth: Payer: Self-pay | Admitting: Family Medicine

## 2023-01-01 ENCOUNTER — Encounter (HOSPITAL_COMMUNITY): Payer: Self-pay | Admitting: Emergency Medicine

## 2023-01-01 ENCOUNTER — Emergency Department (HOSPITAL_COMMUNITY)
Admission: EM | Admit: 2023-01-01 | Discharge: 2023-01-01 | Disposition: A | Discharge status: Home / Self Care | Payer: Medicare PPO | Attending: Emergency Medicine | Admitting: Emergency Medicine

## 2023-01-01 ENCOUNTER — Other Ambulatory Visit: Payer: Self-pay

## 2023-01-01 ENCOUNTER — Inpatient Hospital Stay: Payer: Medicare PPO | Admitting: Family Medicine

## 2023-01-01 DIAGNOSIS — G250 Essential tremor: Secondary | ICD-10-CM | POA: Diagnosis not present

## 2023-01-01 DIAGNOSIS — Z85118 Personal history of other malignant neoplasm of bronchus and lung: Secondary | ICD-10-CM | POA: Insufficient documentation

## 2023-01-01 DIAGNOSIS — Z79890 Hormone replacement therapy (postmenopausal): Secondary | ICD-10-CM | POA: Diagnosis not present

## 2023-01-01 DIAGNOSIS — E039 Hypothyroidism, unspecified: Secondary | ICD-10-CM | POA: Diagnosis not present

## 2023-01-01 DIAGNOSIS — N189 Chronic kidney disease, unspecified: Secondary | ICD-10-CM | POA: Diagnosis not present

## 2023-01-01 DIAGNOSIS — G2581 Restless legs syndrome: Secondary | ICD-10-CM | POA: Diagnosis not present

## 2023-01-01 DIAGNOSIS — R600 Localized edema: Secondary | ICD-10-CM | POA: Insufficient documentation

## 2023-01-01 DIAGNOSIS — R5383 Other fatigue: Secondary | ICD-10-CM | POA: Insufficient documentation

## 2023-01-01 DIAGNOSIS — C3490 Malignant neoplasm of unspecified part of unspecified bronchus or lung: Secondary | ICD-10-CM | POA: Diagnosis not present

## 2023-01-01 DIAGNOSIS — I959 Hypotension, unspecified: Secondary | ICD-10-CM | POA: Diagnosis not present

## 2023-01-01 DIAGNOSIS — M549 Dorsalgia, unspecified: Secondary | ICD-10-CM | POA: Diagnosis not present

## 2023-01-01 DIAGNOSIS — G8929 Other chronic pain: Secondary | ICD-10-CM | POA: Diagnosis not present

## 2023-01-01 DIAGNOSIS — G259 Extrapyramidal and movement disorder, unspecified: Secondary | ICD-10-CM | POA: Diagnosis not present

## 2023-01-01 DIAGNOSIS — M353 Polymyalgia rheumatica: Secondary | ICD-10-CM | POA: Diagnosis not present

## 2023-01-01 DIAGNOSIS — D803 Selective deficiency of immunoglobulin G [IgG] subclasses: Secondary | ICD-10-CM | POA: Diagnosis not present

## 2023-01-01 DIAGNOSIS — R230 Cyanosis: Secondary | ICD-10-CM | POA: Diagnosis not present

## 2023-01-01 LAB — COMPREHENSIVE METABOLIC PANEL
ALT: 48 U/L — ABNORMAL HIGH (ref 0–44)
AST: 51 U/L — ABNORMAL HIGH (ref 15–41)
Albumin: 2.9 g/dL — ABNORMAL LOW (ref 3.5–5.0)
Alkaline Phosphatase: 60 U/L (ref 38–126)
Anion gap: 9 (ref 5–15)
BUN: 29 mg/dL — ABNORMAL HIGH (ref 8–23)
CO2: 27 mmol/L (ref 22–32)
Calcium: 8.7 mg/dL — ABNORMAL LOW (ref 8.9–10.3)
Chloride: 102 mmol/L (ref 98–111)
Creatinine, Ser: 1.49 mg/dL — ABNORMAL HIGH (ref 0.44–1.00)
GFR, Estimated: 37 mL/min — ABNORMAL LOW (ref >60)
Glucose, Bld: 105 mg/dL — ABNORMAL HIGH (ref 70–99)
Potassium: 3.6 mmol/L (ref 3.5–5.1)
Sodium: 138 mmol/L (ref 135–145)
Total Bilirubin: 0.4 mg/dL (ref 0.3–1.2)
Total Protein: 6.6 g/dL (ref 6.5–8.1)

## 2023-01-01 LAB — CBC WITH DIFFERENTIAL/PLATELET
Abs Immature Granulocytes: 0.01 10*3/uL (ref 0.00–0.07)
Basophils Absolute: 0.1 10*3/uL (ref 0.0–0.1)
Basophils Relative: 2 %
Eosinophils Absolute: 0.1 10*3/uL (ref 0.0–0.5)
Eosinophils Relative: 2 %
HCT: 34.3 % — ABNORMAL LOW (ref 36.0–46.0)
Hemoglobin: 11.2 g/dL — ABNORMAL LOW (ref 12.0–15.0)
Immature Granulocytes: 0 %
Lymphocytes Relative: 34 %
Lymphs Abs: 1.3 10*3/uL (ref 0.7–4.0)
MCH: 30.6 pg (ref 26.0–34.0)
MCHC: 32.7 g/dL (ref 30.0–36.0)
MCV: 93.7 fL (ref 80.0–100.0)
Monocytes Absolute: 0.5 10*3/uL (ref 0.1–1.0)
Monocytes Relative: 12 %
Neutro Abs: 1.8 10*3/uL (ref 1.7–7.7)
Neutrophils Relative %: 50 %
Platelets: 127 10*3/uL — ABNORMAL LOW (ref 150–400)
RBC: 3.66 MIL/uL — ABNORMAL LOW (ref 3.87–5.11)
RDW: 14.1 % (ref 11.5–15.5)
WBC: 3.7 10*3/uL — ABNORMAL LOW (ref 4.0–10.5)
nRBC: 0 % (ref 0.0–0.2)

## 2023-01-01 LAB — TSH: TSH: 0.775 u[IU]/mL (ref 0.350–4.500)

## 2023-01-01 MED ORDER — HEPARIN SOD (PORK) LOCK FLUSH 100 UNIT/ML IV SOLN
500.0000 [IU] | Freq: Once | INTRAVENOUS | Status: DC
  Filled 2023-01-01: qty 5

## 2023-01-01 NOTE — Telephone Encounter (Signed)
Frances Furbish called needing Verbal orders for OT once a week for two weeks for functional mobility, healthy promotion and safety  Please call [phone]

## 2023-01-01 NOTE — Telephone Encounter (Signed)
BP at that level needs to be evaluated, likely at a place that can ck labs and hydrate her (ER).

## 2023-01-01 NOTE — Telephone Encounter (Signed)
Called HHRN of per PCP ok

## 2023-01-01 NOTE — Telephone Encounter (Signed)
Megan Brewer OT with [facility]: [phone] Pts BP is 80/48- feels fine. HR 75 Pt was just at the ER on 12/30/22 for weakness, low BP.   I advised Roe Coombs that you would likley advise that she return to the ER. Pt states that "She would have to have two feet in the grave before she went back to the ER."  I advised Roe Coombs that I would let PCP know and return his phone call.

## 2023-01-01 NOTE — Telephone Encounter (Signed)
Form faxed today 01/01/23

## 2023-01-01 NOTE — Discharge Instructions (Signed)
Your history, exam and workup today revealed labs overall similar to prior.  Your blood pressure fluctuated while you have been here and remained above 100 for most of the visit.  You had no significant change in your chronic fatigue and reports that you are feeling at her baseline now.  We offered to get a urinalysis and give you fluids and monitor for several hours however you have been here for 4 hours without lightheadedness, syncope, or significant worsened hypotension.  We feel you are safe for discharge home after a shared decision-making conversation.  Please follow-up with your primary doctor for outpatient follow-up and for recheck of your labs that showed mild anemia.  If any symptoms change or worsen acutely, please return to the nearest emergency department.

## 2023-01-01 NOTE — ED Provider Notes (Signed)
[place] EMERGENCY DEPARTMENT AT [facility] Provider Note   CSN: [id number] Arrival date & time: 01/01/23  1231     History  Chief Complaint  Patient presents with   Hypotension    Megan Brewer is a 77 y.o. female.  The history is provided by the patient, medical records and the spouse. No language interpreter was used.  Illness Location:  Fatigue Quality:  Generalized and chronic Severity:  Mild Onset quality:  Gradual Timing:  Constant Progression:  Waxing and waning Chronicity:  Chronic Associated symptoms: fatigue   Associated symptoms: no abdominal pain, no chest pain, no congestion, no cough, no diarrhea, no fever, no headaches, no loss of consciousness, no nausea, no rash, no shortness of breath, no vomiting and no wheezing        Home Medications Prior to Admission medications   Medication Sig Start Date End Date Taking? Authorizing Provider  acetaminophen (TYLENOL) 500 MG tablet Take 1,000 mg by mouth as needed for moderate pain.    [provider]  azelastine (ASTELIN) 0.1 % nasal spray Place 2 sprays into both nostrils 2 (two) times daily. Patient taking differently: Place 2 sprays into both nostrils as needed for rhinitis or allergies. 03/10/21   Hetty Blend, FNP  baclofen (LIORESAL) 10 MG tablet Take 10 mg by mouth daily as needed for muscle spasms. 06/03/20   [provider]  CALCIUM PO Take 1 tablet by mouth in the morning and at bedtime.    [provider]  cholecalciferol (VITAMIN D3) 25 MCG (1000 UNIT) tablet Take 1,000 Units by mouth daily.    [provider]  cycloSPORINE (RESTASIS) 0.05 % ophthalmic emulsion Place 1 drop into both eyes in the morning, at noon, in the evening, and at bedtime.    [provider]  dexlansoprazole (DEXILANT) 60 MG capsule Take 1 capsule (60 mg total) by mouth daily with breakfast. 04/30/22   Esterwood, Amy S, PA-C  famotidine (PEPCID) 20 MG tablet Take 1 tablet  (20 mg total) by mouth at bedtime. 04/30/22   Esterwood, Amy S, PA-C  ferrous sulfate 325 (65 FE) MG tablet Take 650 mg by mouth daily with breakfast.    [provider]  fludrocortisone (FLORINEF) 0.1 MG tablet Take 1 tablet (0.1 mg total) by mouth daily before supper. 12/24/22   Gherghe, Daylene Katayama, MD  GAMMAGARD 20 GM/200ML SOLN Inject 40 g into the vein every 21 ( twenty-one) days. INFUSE 40G INTRAVENOUSLY EVERY 3 WEEKS 02/27/22   [provider]  heparin lock flush 100 UNIT/ML SOLN injection Inject 500 Units into the vein every 21 ( twenty-one) days. 03/31/21   [provider]  ibuprofen (ADVIL) 200 MG tablet Take 600 mg by mouth as needed for mild pain.    [provider]  KEVZARA 200 MG/1. SOAJ Inject 1.14 mLs into the skin every 14 (fourteen) days. 09/09/22   [provider]  levothyroxine (SYNTHROID) 75 MCG tablet TAKE ONE (1) TABLET BY MOUTH EACH DAY BEFORE BREAKFAST ON EMPTY STOMACH Patient taking differently: Take 75 mcg by mouth daily before breakfast. 30 MINUTES BEFORE BREAKFAST ON EMPTY STOMACH. 09/11/22   Sharlene Dory, DO  magnesium oxide (MAG-OX) 400 MG tablet Take 400 mg by mouth daily.     [provider]  midodrine (PROAMATINE) 5 MG tablet Take 1 tablet (5 mg total) by mouth 2 (two) times daily with a meal. Breakfast and lunch 12/24/22   Leatha Gilding, MD  mupirocin  ointment (BACTROBAN) 2 % Apply 1 application. topically 2 (two) times daily. 12/29/21   Sharon Seller, NP  MYRBETRIQ 50 MG TB24 tablet Take 50 mg by mouth daily. 10/07/22   [provider]  ondansetron (ZOFRAN) 8 MG tablet Take by mouth every 8 (eight) hours as needed for nausea or vomiting.    Sharlene Dory, DO  potassium chloride (KLOR-CON) 10 MEQ tablet Take 2 tablets (20 mEq total) by mouth 2 (two) times daily. 12/29/22   Sharlene Dory, DO  pyridoxine (B-6) 100 MG tablet Take 100 mg by mouth daily.    Sharlene Dory, DO  rOPINIRole (REQUIP) 0.5 MG tablet Take 0.5 mg by mouth 3 (three) times daily.    Sharlene Dory, DO  sucralfate (CARAFATE) 1 GM/10ML suspension Take 10 mLs (1 g total) by mouth 4 (four) times daily as needed. 07/13/22   Sharlene Dory, DO  tamsulosin (FLOMAX) 0.4 MG CAPS capsule Take 1 capsule (0.4 mg total) by mouth daily. 01/23/21   Burnadette Pop, MD  torsemide (DEMADEX) 20 MG tablet Take 1 tablet (20 mg total) by mouth daily as needed (fluid, edema). 12/24/22   Leatha Gilding, MD  triamcinolone cream (KENALOG) 0.1 % Apply 1 application  topically 2 (two) times daily as needed (rash).    [provider]  vitamin B-12 (CYANOCOBALAMIN) 1000 MCG tablet Take 1,000 mcg by mouth daily. 02/14/21   [provider]  Vitamin D, Ergocalciferol, (DRISDOL) 1.25 MG (50000 UNIT) CAPS capsule Take 1 capsule (50,000 Units total) by mouth every 7 (seven) days. Patient taking differently: Take 50,000 Units by mouth every 7 (seven) days. Thursdays 08/27/22   [name], Drema Pry, MD  ZINC OXIDE, TOPICAL, 10 % CREA Apply 1 application topically 2 (two) times daily as needed (rash). 02/14/21   Glade Lloyd, MD      Allergies    Butorphanol, Erythromycin base, Propoxyphene, Sumatriptan, Tetracycline, Ciprofloxacin, Corticosteroids, Doxycycline, Cefixime, Gabapentin, Isometheptene-dichloral-apap, Ketorolac, Prednisone, Promethazine, and Tape    Review of Systems   Review of Systems  Constitutional:  Positive for fatigue. Negative for chills, diaphoresis and fever.  HENT:  Negative for congestion.   Eyes:  Negative for visual disturbance.  Respiratory:  Negative for cough, choking, chest tightness, shortness of breath and wheezing.   Cardiovascular:  Negative for chest pain, palpitations and leg swelling.  Gastrointestinal:  Negative for abdominal pain, constipation, diarrhea, nausea and vomiting.  Genitourinary:  Negative for dysuria.  Musculoskeletal:  Negative  for back pain, neck pain and neck stiffness.  Skin:  Negative for rash and wound.  Neurological:  Negative for dizziness, loss of consciousness, weakness, light-headedness, numbness and headaches.  Psychiatric/Behavioral:  Negative for agitation and confusion.   All other systems reviewed and are negative.   Physical Exam Updated Vital Signs BP (!) 113/58 (BP Location: Left Arm)   Pulse 66   Temp 98.3 F (36.8 C)   Resp 16   SpO2 100%  Physical Exam Vitals and nursing note reviewed.  Constitutional:      General: She is not in acute distress.    Appearance: She is well-developed. She is not ill-appearing, toxic-appearing or diaphoretic.  HENT:     Head: Normocephalic and atraumatic.     Mouth/Throat:     Mouth: Mucous membranes are moist.     Pharynx: No oropharyngeal exudate or posterior oropharyngeal erythema.  Eyes:     Extraocular Movements: Extraocular movements intact.     Conjunctiva/sclera: Conjunctivae normal.  Pupils: Pupils are equal, round, and reactive to light.  Cardiovascular:     Rate and Rhythm: Normal rate and regular rhythm.     Pulses: Normal pulses.     Heart sounds: No murmur heard. Pulmonary:     Effort: Pulmonary effort is normal. No respiratory distress.     Breath sounds: Normal breath sounds. No wheezing, rhonchi or rales.  Chest:     Chest wall: No tenderness.  Abdominal:     General: Abdomen is flat.     Palpations: Abdomen is soft.     Tenderness: There is no abdominal tenderness. There is no right CVA tenderness, left CVA tenderness, guarding or rebound.  Musculoskeletal:        General: No swelling or tenderness.     Cervical back: Neck supple. No tenderness.     Right lower leg: Edema present.     Left lower leg: Edema present.     Comments: Mild edema in leg that is markedly improved compared to baseline.  Skin:    General: Skin is warm and dry.     Capillary Refill: Capillary refill takes less than 2 seconds.  Neurological:      General: No focal deficit present.     Mental Status: She is alert.     Sensory: No sensory deficit.     Motor: No weakness.  Psychiatric:        Mood and Affect: Mood normal.     ED Results / Procedures / Treatments   Labs (all labs ordered are listed, but only abnormal results are displayed) Labs Reviewed  CBC WITH DIFFERENTIAL/PLATELET - Abnormal; Notable for the following components:      Result Value   WBC 3.7 (*)    RBC 3.66 (*)    Hemoglobin 11.2 (*)    HCT 34.3 (*)    Platelets 127 (*)    All other components within normal limits  COMPREHENSIVE METABOLIC PANEL - Abnormal; Notable for the following components:   Glucose, Bld 105 (*)    BUN 29 (*)    Creatinine, Ser 1.49 (*)    Calcium 8.7 (*)    Albumin 2.9 (*)    AST 51 (*)    ALT 48 (*)    GFR, Estimated 37 (*)    All other components within normal limits  TSH  LACTIC ACID, PLASMA  LACTIC ACID, PLASMA  URINALYSIS, ROUTINE W REFLEX MICROSCOPIC    EKG None  Radiology DG Chest Portable 1 View  Result Date: 01/01/2023 CLINICAL DATA:  Hypotension EXAM: PORTABLE CHEST 1 VIEW COMPARISON:  Chest radiograph dated 12/30/2022 FINDINGS: Lines/tubes: Right chest wall port tip projects over the superior cavoatrial junction. Multiple radiopaque leads project over the thoracic spine. Chest: Surgical chain sutures project over the right lung base and right hilar region. No focal consolidations. Pleura: No pneumothorax or pleural effusion. Heart/mediastinum: Multiple surgical clips project over the mediastinum. The heart size and mediastinal contours are within normal limits. Bones: No acute osseous abnormality. Unchanged concavity of the superior aspect of the right seventh posterior rib. IMPRESSION: No focal consolidations. Electronically Signed   By: Agustin Cree M.D.   On: 01/01/2023 14:51    Procedures Procedures    Medications Ordered in ED Medications  heparin lock flush 100 unit/mL (has no administration in time range)     ED Course/ Medical Decision Making/ A&P  Medical Decision Making Amount and/or Complexity of Data Reviewed Labs: ordered. Radiology: ordered.    Megan Brewer is a 77 y.o. female with a past medical history significant for hypothyroidism, previous migraines, gastric bypass surgery, hypogammaglobulinemia, immunoglobulin G deficiency, polymyalgia rheumatica, bipolar disorder, and lung cancer who has had previous cholecystectomy and hysterectomy and spinal cord stimulator presents with fatigue and low blood pressure.  According to patient, she was recent admitted for AKI and hypotension but she reports she chronically has low blood pressures.  She reports that she has had fatigue chronically and did not feel more fatigued today.  She said that Occupational Therapy was seeing her and found her blood pressure reading to be 80 systolic so she was sent in for evaluation.  She reports that she takes midodrine and reports 80 is not that low for her where she has been in the 50s in the past.  She says that she has been eating and drinking well and denies any further constipation or diarrhea.  Denies any dysuria.  Denies any fevers, chills, congestion, cough, nausea, vomiting, or other complaints.  She has no pain at this time.  She reports she has no symptoms of the chronic fatigue which is not different.  She did not want to come but due to her PCP and family wanting her to get evaluated she presents.  On my arrival, blood pressure is over 100 systolic.  She is resting comfortably.  Chest is nontender.  Abdomen nontender.  Patient well-appearing.  No focal neurologic deficits.  Mucous membranes are moist.  Patient well-appearing.  She is not tachycardic or tachypneic.  She is not hypoxic.  We had a shared decision-making conversation the patient's mild bleeding some screening labs and workup to look for occult infection with urinalysis and chest x-ray.  She is allowed to  eat and drink at this time.  We agreed that if her workup is reassuring and she has no evidence of new AKI, she is likely stable for discharge home for outpatient follow-up and to follow-up with her regular doctor about blood pressure chronically.  Anticipate reassessment after workup.  Workup again to return.  CBC shows decrease in white count, hemoglobin, and platelets, possibly due to fluid shifts.  She reported no rectal bleeding or vomiting blood.  Metabolic panel appears similar to prior with unchanged creatinine.  TSH normal.    While waiting her to urinate to get a urinalysis, patient reports he wants to go home.  She is been here for about 4 hours and has no further lightheadedness.  She reports her fatigue is unchanged and her blood pressure has been primarily over 100.  It did dip into the 90s at 1 point but she reports that is normal for her.  She reports she would like to go home and eat and drink and will call her doctor for close follow-up.  She will continue her home midodrine and other medications.  She did not want to wait for other blood draw such as lactic acid either.  Patient will follow-up with her primary doctor and understands good return precautions and follow-up instructions.  She to the questions or concerns and was discharged in stable condition with out significant fatigue or further hypotension on my reassessment.         Final Clinical Impression(s) / ED Diagnoses Final diagnoses:  Hypotension, unspecified hypotension type  Fatigue, unspecified type    Clinical Impression: 1. Hypotension, unspecified hypotension type   2. Fatigue, unspecified type  Disposition: Discharge  Condition: Good  I have discussed the results, Dx and Tx plan with the pt(& family if present). He/she/they expressed understanding and agree(s) with the plan. Discharge instructions discussed at great length. Strict return precautions discussed and pt &/or family have verbalized  understanding of the instructions. No further questions at time of discharge.    New Prescriptions   No medications on file    Follow Up: Sharlene Dory, DO [address] [place] [address] [phone]     [facility] Emergency Department at [facility] [address] [id number] mc [place] [address] [phone]        Neyah Ellerman, Canary Brim, MD 01/01/23 [phone]

## 2023-01-01 NOTE — Telephone Encounter (Signed)
Called therapist informed of PCP instructions. He stated he would call 911 and inform the patient of PCP instructions

## 2023-01-01 NOTE — ED Triage Notes (Signed)
Pt BIB EMS from home. Per EMS pt has autonomic disorder which poorly regulates blood pressure, respirations and circulation (causes lips to turn blue for unknown cause). Pt was found in [place] position in w/c at home, pressures 90/58, placed in seated position, SBP 82. Pt c/o shakiness but no LOC, weakness, or pain.

## 2023-01-02 ENCOUNTER — Other Ambulatory Visit: Payer: Self-pay | Admitting: Physical Medicine and Rehabilitation

## 2023-01-04 ENCOUNTER — Other Ambulatory Visit: Payer: Self-pay | Admitting: Family Medicine

## 2023-01-04 ENCOUNTER — Other Ambulatory Visit (INDEPENDENT_AMBULATORY_CARE_PROVIDER_SITE_OTHER): Payer: Medicare PPO

## 2023-01-04 DIAGNOSIS — D803 Selective deficiency of immunoglobulin G [IgG] subclasses: Secondary | ICD-10-CM | POA: Diagnosis not present

## 2023-01-04 DIAGNOSIS — M353 Polymyalgia rheumatica: Secondary | ICD-10-CM | POA: Diagnosis not present

## 2023-01-04 DIAGNOSIS — M797 Fibromyalgia: Secondary | ICD-10-CM | POA: Diagnosis not present

## 2023-01-04 DIAGNOSIS — E876 Hypokalemia: Secondary | ICD-10-CM | POA: Diagnosis not present

## 2023-01-04 DIAGNOSIS — R7989 Other specified abnormal findings of blood chemistry: Secondary | ICD-10-CM

## 2023-01-04 DIAGNOSIS — Z79899 Other long term (current) drug therapy: Secondary | ICD-10-CM | POA: Diagnosis not present

## 2023-01-04 DIAGNOSIS — G894 Chronic pain syndrome: Secondary | ICD-10-CM | POA: Diagnosis not present

## 2023-01-04 LAB — COMPREHENSIVE METABOLIC PANEL
ALT: 47 U/L — ABNORMAL HIGH (ref 0–35)
AST: 49 U/L — ABNORMAL HIGH (ref 0–37)
Albumin: 3.4 g/dL — ABNORMAL LOW (ref 3.5–5.2)
Alkaline Phosphatase: 70 U/L (ref 39–117)
BUN: 43 mg/dL — ABNORMAL HIGH (ref 6–23)
CO2: 23 mEq/L (ref 19–32)
Calcium: 9.2 mg/dL (ref 8.4–10.5)
Chloride: 107 mEq/L (ref 96–112)
Creatinine, Ser: 1.71 mg/dL — ABNORMAL HIGH (ref 0.40–1.20)
GFR: 29.16 mL/min — ABNORMAL LOW (ref >60.00)
Glucose, Bld: 90 mg/dL (ref 70–99)
Potassium: 4.1 mEq/L (ref 3.5–5.1)
Sodium: 142 mEq/L (ref 135–145)
Total Bilirubin: 0.4 mg/dL (ref 0.2–1.2)
Total Protein: 6.7 g/dL (ref 6.0–8.3)

## 2023-01-05 ENCOUNTER — Telehealth: Payer: Self-pay

## 2023-01-05 DIAGNOSIS — G250 Essential tremor: Secondary | ICD-10-CM | POA: Diagnosis not present

## 2023-01-05 DIAGNOSIS — G8929 Other chronic pain: Secondary | ICD-10-CM | POA: Diagnosis not present

## 2023-01-05 DIAGNOSIS — N189 Chronic kidney disease, unspecified: Secondary | ICD-10-CM | POA: Diagnosis not present

## 2023-01-05 DIAGNOSIS — D803 Selective deficiency of immunoglobulin G [IgG] subclasses: Secondary | ICD-10-CM | POA: Diagnosis not present

## 2023-01-05 DIAGNOSIS — M353 Polymyalgia rheumatica: Secondary | ICD-10-CM | POA: Diagnosis not present

## 2023-01-05 DIAGNOSIS — M549 Dorsalgia, unspecified: Secondary | ICD-10-CM | POA: Diagnosis not present

## 2023-01-05 DIAGNOSIS — C3490 Malignant neoplasm of unspecified part of unspecified bronchus or lung: Secondary | ICD-10-CM | POA: Diagnosis not present

## 2023-01-05 DIAGNOSIS — G259 Extrapyramidal and movement disorder, unspecified: Secondary | ICD-10-CM | POA: Diagnosis not present

## 2023-01-05 DIAGNOSIS — G2581 Restless legs syndrome: Secondary | ICD-10-CM | POA: Diagnosis not present

## 2023-01-05 NOTE — Telephone Encounter (Signed)
Prolia VOB initiated via [email or web address]   Last Prolia inj: 08/06/22 Next Prolia inj DUE: 02/05/23

## 2023-01-05 NOTE — Telephone Encounter (Signed)
Created new encounter for Prolia BIV. Will route encounter back once benefit verification is complete.  

## 2023-01-05 NOTE — Telephone Encounter (Signed)
Transition Care Management Unsuccessful Follow-up Telephone Call  Date of discharge and from where:  01/01/2023 [facility]  Attempts:  1st Attempt  Reason for unsuccessful TCM follow-up call:  Left voice message  Erika Slaby Wallace  [facility] Population Health Community Resource Care Guide   [email or web address]  ?? [phone]   Website: [email or web address]  [email or web address]      

## 2023-01-05 NOTE — Telephone Encounter (Signed)
Please check EOB- pt due in 1 month for Prolia injection.

## 2023-01-06 DIAGNOSIS — N189 Chronic kidney disease, unspecified: Secondary | ICD-10-CM | POA: Diagnosis not present

## 2023-01-06 DIAGNOSIS — G259 Extrapyramidal and movement disorder, unspecified: Secondary | ICD-10-CM | POA: Diagnosis not present

## 2023-01-06 DIAGNOSIS — D803 Selective deficiency of immunoglobulin G [IgG] subclasses: Secondary | ICD-10-CM | POA: Diagnosis not present

## 2023-01-06 DIAGNOSIS — M549 Dorsalgia, unspecified: Secondary | ICD-10-CM | POA: Diagnosis not present

## 2023-01-06 DIAGNOSIS — G8929 Other chronic pain: Secondary | ICD-10-CM | POA: Diagnosis not present

## 2023-01-06 DIAGNOSIS — G250 Essential tremor: Secondary | ICD-10-CM | POA: Diagnosis not present

## 2023-01-06 DIAGNOSIS — M353 Polymyalgia rheumatica: Secondary | ICD-10-CM | POA: Diagnosis not present

## 2023-01-06 DIAGNOSIS — G2581 Restless legs syndrome: Secondary | ICD-10-CM | POA: Diagnosis not present

## 2023-01-06 DIAGNOSIS — M62838 Other muscle spasm: Secondary | ICD-10-CM | POA: Diagnosis not present

## 2023-01-06 DIAGNOSIS — C3490 Malignant neoplasm of unspecified part of unspecified bronchus or lung: Secondary | ICD-10-CM | POA: Diagnosis not present

## 2023-01-06 DIAGNOSIS — M542 Cervicalgia: Secondary | ICD-10-CM | POA: Diagnosis not present

## 2023-01-07 ENCOUNTER — Telehealth: Payer: Self-pay

## 2023-01-07 DIAGNOSIS — G259 Extrapyramidal and movement disorder, unspecified: Secondary | ICD-10-CM | POA: Diagnosis not present

## 2023-01-07 DIAGNOSIS — M353 Polymyalgia rheumatica: Secondary | ICD-10-CM | POA: Diagnosis not present

## 2023-01-07 DIAGNOSIS — G8929 Other chronic pain: Secondary | ICD-10-CM | POA: Diagnosis not present

## 2023-01-07 DIAGNOSIS — N189 Chronic kidney disease, unspecified: Secondary | ICD-10-CM | POA: Diagnosis not present

## 2023-01-07 DIAGNOSIS — G2581 Restless legs syndrome: Secondary | ICD-10-CM | POA: Diagnosis not present

## 2023-01-07 DIAGNOSIS — C3490 Malignant neoplasm of unspecified part of unspecified bronchus or lung: Secondary | ICD-10-CM | POA: Diagnosis not present

## 2023-01-07 DIAGNOSIS — D803 Selective deficiency of immunoglobulin G [IgG] subclasses: Secondary | ICD-10-CM | POA: Diagnosis not present

## 2023-01-07 DIAGNOSIS — M549 Dorsalgia, unspecified: Secondary | ICD-10-CM | POA: Diagnosis not present

## 2023-01-07 DIAGNOSIS — G250 Essential tremor: Secondary | ICD-10-CM | POA: Diagnosis not present

## 2023-01-07 NOTE — Telephone Encounter (Signed)
Transition Care Management Follow-up Telephone Call Date of discharge and from where: 01/01/2023 [facility] How have you been since you were released from the hospital? Patient stated she feels better at times. Any questions or concerns? No  Items Reviewed: Did the pt receive and understand the discharge instructions provided? Yes  Medications obtained and verified? Yes  Other? No  Any new allergies since your discharge? No  Dietary orders reviewed? Yes Do you have support at home? Yes   Follow up appointments reviewed:  PCP Hospital f/u appt confirmed? Yes  Scheduled to see Arva Chafe DO on 02/24/2023 @ [facility]. Specialist Hospital f/u appt confirmed? No  Scheduled to see  on  @ . Are transportation arrangements needed? No  If their condition worsens, is the pt aware to call PCP or go to the Emergency Dept.? Yes Was the patient provided with contact information for the PCP's office or ED? Yes Was to pt encouraged to call back with questions or concerns? Yes  Aeris Hersman [facility]  [facility] Population Health Community Resource Care Guide   [email or web address]  ?? [phone]   Website: [email or web address]

## 2023-01-12 ENCOUNTER — Ambulatory Visit (HOSPITAL_BASED_OUTPATIENT_CLINIC_OR_DEPARTMENT_OTHER)
Admission: RE | Admit: 2023-01-12 | Discharge: 2023-01-12 | Disposition: A | Discharge status: Home / Self Care | Payer: Medicare PPO | Source: Ambulatory Visit | Attending: Family Medicine | Admitting: Family Medicine

## 2023-01-12 DIAGNOSIS — R7989 Other specified abnormal findings of blood chemistry: Secondary | ICD-10-CM | POA: Diagnosis not present

## 2023-01-12 DIAGNOSIS — R945 Abnormal results of liver function studies: Secondary | ICD-10-CM | POA: Diagnosis not present

## 2023-01-14 DIAGNOSIS — G259 Extrapyramidal and movement disorder, unspecified: Secondary | ICD-10-CM | POA: Diagnosis not present

## 2023-01-14 DIAGNOSIS — G250 Essential tremor: Secondary | ICD-10-CM | POA: Diagnosis not present

## 2023-01-14 DIAGNOSIS — M353 Polymyalgia rheumatica: Secondary | ICD-10-CM | POA: Diagnosis not present

## 2023-01-14 DIAGNOSIS — M549 Dorsalgia, unspecified: Secondary | ICD-10-CM | POA: Diagnosis not present

## 2023-01-14 DIAGNOSIS — N189 Chronic kidney disease, unspecified: Secondary | ICD-10-CM | POA: Diagnosis not present

## 2023-01-14 DIAGNOSIS — D803 Selective deficiency of immunoglobulin G [IgG] subclasses: Secondary | ICD-10-CM | POA: Diagnosis not present

## 2023-01-14 DIAGNOSIS — C3490 Malignant neoplasm of unspecified part of unspecified bronchus or lung: Secondary | ICD-10-CM | POA: Diagnosis not present

## 2023-01-14 DIAGNOSIS — G8929 Other chronic pain: Secondary | ICD-10-CM | POA: Diagnosis not present

## 2023-01-14 DIAGNOSIS — G2581 Restless legs syndrome: Secondary | ICD-10-CM | POA: Diagnosis not present

## 2023-01-15 ENCOUNTER — Ambulatory Visit: Payer: Self-pay

## 2023-01-15 NOTE — Patient Instructions (Signed)
Visit Information  Thank you for taking time to visit with me today. Please don't hesitate to contact me if I can be of assistance to you.   Following are the goals we discussed today:  Call PCP or go to urgent care or ED if condition worsens or does not improve Continue to check your BP and notify provider if outside recommended range Continue to work with home health agency as recommended    Care Guide will call you to schedule our next  telephone appointment.  Please call the care guide team at [phone] if you need to cancel or reschedule your appointment.   If you are experiencing a Mental Health or Behavioral Health Crisis or need someone to talk to, please call the Suicide and Crisis Lifeline: 56  Kathyrn Sheriff, RN, MSN, BSN, CCM [facility] Care Coordinator [phone]

## 2023-01-15 NOTE — Patient Outreach (Signed)
  Care Coordination   Follow Up Visit Note   01/15/2023 Name: Megan Brewer MRN: [id number] DOB: 24-Feb-1946  Megan Brewer is a 77 y.o. year old female who sees Carmelia Roller, Jilda Roche, DO for primary care. I spoke with  Megan Brewer by phone today.  What matters to the patients health and wellness today?  Initial call, [name] states that she had to take another call and could not talk.   RNCM called patient back. Megan Brewer states, " I don't feel well". Megan Brewer is not specific with any other complaints. She reports it has been going on several days. She states blood pressure is better and has been >90 systolic when she checks it. However, no specific readings provided. Mrs. Megan Brewer ended call after about 7 minutes stating "I got to go". She is refusing to contact PCP and states her PCP is not in the office on Fridays. She reports she has an appointment with PCP next week. However, per review of chart she is not scheduled until July, 2024. She is active with home health [facility]. RNCM concern: patient expressing general symptoms of not feeling well, voice sounds weak, patient with some confusion regarding next PCP appointment. Patient ended call early stating she was not feeling well.   Goals Addressed             This Visit's Progress    continue to improve post hospitalization       Interventions Today    Flowsheet Row Most Recent Value  Chronic Disease   Chronic disease during today's visit Other  [post hospitalization for orthostatic hypotension with near-syncope]  General Interventions   General Interventions Discussed/Reviewed General Interventions Reviewed, Doctor Visits  Doctor Visits Discussed/Reviewed Doctor Visits Discussed, PCP  Education Interventions   Provided Verbal Education On When to see the doctor  [encouraged to contact primary care provider. advised to go to urgent care or ED if condition does not improve or worsens]            SDOH assessments  and interventions completed:  No  Care Coordination Interventions:  Yes, provided   Follow up plan:  RNCM will ask care guide to call to reschedule appointment    Encounter Outcome:  Pt. Visit Completed   Kathyrn Sheriff, RN, MSN, BSN, CCM [facility] Care Coordinator [phone]

## 2023-01-18 ENCOUNTER — Emergency Department (HOSPITAL_COMMUNITY)
Admission: EM | Admit: 2023-01-18 | Discharge: 2023-01-18 | Disposition: A | Discharge status: Home / Self Care | Payer: Medicare PPO | Attending: Student | Admitting: Student

## 2023-01-18 DIAGNOSIS — I447 Left bundle-branch block, unspecified: Secondary | ICD-10-CM | POA: Diagnosis not present

## 2023-01-18 DIAGNOSIS — Z79890 Hormone replacement therapy (postmenopausal): Secondary | ICD-10-CM | POA: Insufficient documentation

## 2023-01-18 DIAGNOSIS — I509 Heart failure, unspecified: Secondary | ICD-10-CM | POA: Insufficient documentation

## 2023-01-18 DIAGNOSIS — I959 Hypotension, unspecified: Secondary | ICD-10-CM | POA: Diagnosis not present

## 2023-01-18 DIAGNOSIS — Z79899 Other long term (current) drug therapy: Secondary | ICD-10-CM | POA: Diagnosis not present

## 2023-01-18 DIAGNOSIS — E039 Hypothyroidism, unspecified: Secondary | ICD-10-CM | POA: Insufficient documentation

## 2023-01-18 DIAGNOSIS — I130 Hypertensive heart and chronic kidney disease with heart failure and stage 1 through stage 4 chronic kidney disease, or unspecified chronic kidney disease: Secondary | ICD-10-CM | POA: Insufficient documentation

## 2023-01-18 DIAGNOSIS — N183 Chronic kidney disease, stage 3 unspecified: Secondary | ICD-10-CM | POA: Insufficient documentation

## 2023-01-18 DIAGNOSIS — Z85118 Personal history of other malignant neoplasm of bronchus and lung: Secondary | ICD-10-CM | POA: Diagnosis not present

## 2023-01-18 DIAGNOSIS — R519 Headache, unspecified: Secondary | ICD-10-CM | POA: Diagnosis not present

## 2023-01-18 DIAGNOSIS — R5383 Other fatigue: Secondary | ICD-10-CM | POA: Diagnosis present

## 2023-01-18 DIAGNOSIS — R0789 Other chest pain: Secondary | ICD-10-CM | POA: Diagnosis not present

## 2023-01-18 LAB — CBC WITH DIFFERENTIAL/PLATELET
Abs Immature Granulocytes: 0.02 10*3/uL (ref 0.00–0.07)
Basophils Absolute: 0.1 10*3/uL (ref 0.0–0.1)
Basophils Relative: 2 %
Eosinophils Absolute: 0.1 10*3/uL (ref 0.0–0.5)
Eosinophils Relative: 2 %
HCT: 37.9 % (ref 36.0–46.0)
Hemoglobin: 12.5 g/dL (ref 12.0–15.0)
Immature Granulocytes: 0 %
Lymphocytes Relative: 25 %
Lymphs Abs: 1.7 10*3/uL (ref 0.7–4.0)
MCH: 30.6 pg (ref 26.0–34.0)
MCHC: 33 g/dL (ref 30.0–36.0)
MCV: 92.7 fL (ref 80.0–100.0)
Monocytes Absolute: 0.6 10*3/uL (ref 0.1–1.0)
Monocytes Relative: 9 %
Neutro Abs: 4.2 10*3/uL (ref 1.7–7.7)
Neutrophils Relative %: 62 %
Platelets: 183 10*3/uL (ref 150–400)
RBC: 4.09 MIL/uL (ref 3.87–5.11)
RDW: 12.8 % (ref 11.5–15.5)
WBC: 6.8 10*3/uL (ref 4.0–10.5)
nRBC: 0 % (ref 0.0–0.2)

## 2023-01-18 LAB — COMPREHENSIVE METABOLIC PANEL
ALT: 34 U/L (ref 0–44)
AST: 38 U/L (ref 15–41)
Albumin: 2.9 g/dL — ABNORMAL LOW (ref 3.5–5.0)
Alkaline Phosphatase: 62 U/L (ref 38–126)
Anion gap: 7 (ref 5–15)
BUN: 60 mg/dL — ABNORMAL HIGH (ref 8–23)
CO2: 23 mmol/L (ref 22–32)
Calcium: 8.5 mg/dL — ABNORMAL LOW (ref 8.9–10.3)
Chloride: 106 mmol/L (ref 98–111)
Creatinine, Ser: 1.53 mg/dL — ABNORMAL HIGH (ref 0.44–1.00)
GFR, Estimated: 35 mL/min — ABNORMAL LOW (ref >60)
Glucose, Bld: 83 mg/dL (ref 70–99)
Potassium: 4.5 mmol/L (ref 3.5–5.1)
Sodium: 136 mmol/L (ref 135–145)
Total Bilirubin: 0.4 mg/dL (ref 0.3–1.2)
Total Protein: 7.8 g/dL (ref 6.5–8.1)

## 2023-01-18 MED ORDER — HEPARIN SOD (PORK) LOCK FLUSH 100 UNIT/ML IV SOLN
INTRAVENOUS | Status: AC
  Filled 2023-01-18: qty 5

## 2023-01-18 MED ORDER — LACTATED RINGERS IV BOLUS
1000.0000 mL | Freq: Once | INTRAVENOUS | Status: AC
  Administered 2023-01-18: 1000 mL via INTRAVENOUS

## 2023-01-18 MED ORDER — METOCLOPRAMIDE HCL 5 MG/ML IJ SOLN
10.0000 mg | Freq: Once | INTRAMUSCULAR | Status: AC
  Administered 2023-01-18: 10 mg via INTRAVENOUS
  Filled 2023-01-18: qty 2

## 2023-01-18 MED ORDER — DIPHENHYDRAMINE HCL 50 MG/ML IJ SOLN
25.0000 mg | Freq: Once | INTRAMUSCULAR | Status: AC
  Administered 2023-01-18: 25 mg via INTRAVENOUS
  Filled 2023-01-18: qty 1

## 2023-01-18 NOTE — ED Provider Notes (Signed)
[place] EMERGENCY DEPARTMENT AT [facility] Provider Note  CSN: [id number] Arrival date & time: 01/18/23 1348  Chief Complaint(s) Hypotension  HPI Megan Brewer is a 77 y.o. 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  Patient has been seen multiple times in the emergency department and has had multiple hospitalizations for similar complaints.  She states that she had just finished her IVIG today and felt her blood pressure drop.  She states that she feels severe malaise and generalized fatigue when this happens and home blood pressure cuff reading in the 60s.  EMS found the patient to have blood pressures in the 90s and transferred the patient to the emergency room for further evaluation.  Here in the ER, initial blood pressure 110/60.  Patient endorses headache and generalized malaise with fatigue here in the ER but denies chest pain, shortness of breath, abdominal pain, nausea, vomiting or other systemic symptoms.   Past Medical History Past Medical History:  Diagnosis Date   Anemia    Arachnoiditis    Bipolar disorder (HCC)    Cataract    removed   Chronic post-thoracotomy pain    CKD (chronic kidney disease)    GERD (gastroesophageal reflux disease)    Hypogammaglobulinemia (HCC)    Hypotension    Hypothyroid    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HCC)    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HCC)    Lung cancer (HCC) 2011, 2014   Memory loss    Migraine    Opioid abuse (HCC)    Osteoporosis    PMR (polymyalgia rheumatica) (HCC) 03/17/2022   Presence of neurostimulator    Restless legs    Sleep apnea    Patient Active Problem List   Diagnosis Date Noted   Orthostatic hypotension 12/22/2022   Near syncope 12/22/2022   Bipolar mixed affective  disorder, moderate (HCC) 08/25/2022   Tremor 08/25/2022   Polyarthralgia 05/04/2022   Polymyalgia rheumatica (HCC) 03/25/2022   Thrombocytopenia, unspecified (HCC) 12/30/2021   Upper airway cough syndrome with ? VCD  06/26/2021   Precordial chest pain 06/05/2021   Moderate mitral regurgitation 06/05/2021   Essential hypertension 06/05/2021   DOE (dyspnea on exertion) 06/05/2021   Medication monitoring encounter 04/01/2021   Gram-negative bacteremia 03/03/2021   Rigors 02/04/2021   Elevated troponin 01/14/2021   Hypokalemia 01/14/2021   Headache 01/14/2021   Chest discomfort 03/22/2019   Weight loss 01/27/2019   Acquired hypothyroidism 01/27/2019   Esophagitis 01/27/2019   Intractable migraine without status migrainosus 01/27/2019   Osteoporosis 01/27/2019   Chronic sinusitis 12/28/2018   Food intolerance/GI symptoms 12/28/2018   Acute maxillary sinusitis 12/28/2018   Lower leg edema 12/20/2018   Chronic pain of both shoulders 05/24/2018   Bilateral leg edema 05/24/2018   Chronic pain syndrome 03/31/2018   Left bundle branch block 10/28/2017   Palpitations 10/28/2017   History of gastric bypass 05/21/2017   Polypharmacy 02/11/2017   Senile nuclear sclerosis 11/26/2016   Chronic post-thoracotomy pain 04/09/2014   Chronic kidney disease 04/09/2014   Sleep disturbances 09/20/2012   Restless legs syndrome 07/07/2012   Hypertonicity of bladder 05/23/2012   Essential tremor 04/01/2012   Memory loss 04/01/2012   Incomplete emptying of bladder 01/14/2012   Urge incontinence 10/28/2011   Tension type headache 06/10/2011   Mixed urge and stress incontinence 05/28/2011   Hypogammaglobulinemia (HCC)  06/22/2010   Severe episode of recurrent major depressive disorder, without psychotic features (HCC) 06/22/2010   Hypothyroidism 02/12/2010   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/2010   ARACHNOIDITIS 01/31/2010   OBSTRUCTIVE SLEEP APNEA 01/31/2010   Chronic rhinitis 01/31/2010    Arachnoiditis 01/31/2010   Home Medication(s) Prior to Admission medications   Medication Sig Start Date End Date Taking? Authorizing Provider  acetaminophen (TYLENOL) 500 MG tablet Take 1,000 mg by mouth as needed for moderate pain.    [provider]  azelastine (ASTELIN) 0.1 % nasal spray Place 2 sprays into both nostrils 2 (two) times daily. Patient taking differently: Place 2 sprays into both nostrils as needed for rhinitis or allergies. 03/10/21   Hetty Blend, FNP  baclofen (LIORESAL) 10 MG tablet Take 10 mg by mouth daily as needed for muscle spasms. 06/03/20   [provider]  CALCIUM PO Take 1 tablet by mouth in the morning and at bedtime.    [provider]  cholecalciferol (VITAMIN D3) 25 MCG (1000 UNIT) tablet Take 1,000 Units by mouth daily.    [provider]  cycloSPORINE (RESTASIS) 0.05 % ophthalmic emulsion Place 1 drop into both eyes in the morning, at noon, in the evening, and at bedtime.    [provider]  dexlansoprazole (DEXILANT) 60 MG capsule Take 1 capsule (60 mg total) by mouth daily with breakfast. 04/30/22   Esterwood, Amy S, PA-C  famotidine (PEPCID) 20 MG tablet Take 1 tablet (20 mg total) by mouth at bedtime. 04/30/22   Esterwood, Amy S, PA-C  ferrous sulfate 325 (65 FE) MG tablet Take 650 mg by mouth daily with breakfast.    [provider]  fludrocortisone (FLORINEF) 0.1 MG tablet Take 1 tablet (0.1 mg total) by mouth daily before supper. 12/24/22   Gherghe, Daylene Katayama, MD  GAMMAGARD 20 GM/200ML SOLN Inject 40 g into the vein every 21 ( twenty-one) days. INFUSE 40G INTRAVENOUSLY EVERY 3 WEEKS 02/27/22   [provider]  heparin lock flush 100 UNIT/ML SOLN injection Inject 500 Units into the vein every 21 ( twenty-one) days. 03/31/21   [provider]  ibuprofen (ADVIL) 200 MG tablet Take 600 mg by mouth as needed for mild pain.    [provider]  KEVZARA 200 MG/1. SOAJ Inject 1.14 mLs  into the skin every 14 (fourteen) days. 09/09/22   [provider]  levothyroxine (SYNTHROID) 75 MCG tablet TAKE ONE (1) TABLET BY MOUTH EACH DAY BEFORE BREAKFAST ON EMPTY STOMACH Patient taking differently: Take 75 mcg by mouth daily before breakfast. 30 MINUTES BEFORE BREAKFAST ON EMPTY STOMACH. 09/11/22   Sharlene Dory, DO  magnesium oxide (MAG-OX) 400 MG tablet Take 400 mg by mouth daily.     [provider]  midodrine (PROAMATINE) 5 MG tablet Take 1 tablet (5 mg total) by mouth 2 (two) times daily with a meal. Breakfast and lunch 12/24/22   Leatha Gilding, MD  mupirocin ointment (BACTROBAN) 2 % Apply 1 application. topically 2 (two) times daily. 12/29/21   Sharon Seller, NP  MYRBETRIQ 50 MG TB24 tablet Take 50 mg by mouth daily. 10/07/22   [provider]  ondansetron (ZOFRAN) 8 MG tablet Take by mouth every 8 (eight) hours as needed for nausea or vomiting.    Sharlene Dory, DO  potassium chloride (KLOR-CON) 10 MEQ tablet Take 2 tablets (20 mEq total) by mouth 2 (two) times daily. 12/29/22   Sharlene Dory, DO  pyridoxine (B-6)  100 MG tablet Take 100 mg by mouth daily.    Sharlene Dory, DO  rOPINIRole (REQUIP) 0.5 MG tablet Take 0.5 mg by mouth 3 (three) times daily.    Sharlene Dory, DO  sucralfate (CARAFATE) 1 GM/10ML suspension Take 10 mLs (1 g total) by mouth 4 (four) times daily as needed. 07/13/22   Sharlene Dory, DO  tamsulosin (FLOMAX) 0.4 MG CAPS capsule Take 1 capsule (0.4 mg total) by mouth daily. 01/23/21   Burnadette Pop, MD  torsemide (DEMADEX) 20 MG tablet Take 1 tablet (20 mg total) by mouth daily as needed (fluid, edema). 12/24/22   Leatha Gilding, MD  triamcinolone cream (KENALOG) 0.1 % Apply 1 application  topically 2 (two) times daily as needed (rash).    [provider]  vitamin B-12 (CYANOCOBALAMIN) 1000 MCG tablet Take 1,000 mcg by mouth daily. 02/14/21   [provider]  Vitamin D, Ergocalciferol, (DRISDOL) 1.25 MG (50000 UNIT) CAPS capsule TAKE 1 CAPSULE ONCE WEEKLY 01/04/23   [name], Drema Pry, MD  ZINC OXIDE, TOPICAL, 10 % CREA Apply 1 application topically 2 (two) times daily as needed (rash). 02/14/21   Glade Lloyd, MD                                                                                                                                    Past Surgical History Past Surgical History:  Procedure Laterality Date   ABDOMINAL HYSTERECTOMY     CARPAL TUNNEL RELEASE     CATARACT EXTRACTION Bilateral 2019   CHOLECYSTECTOMY     COLONOSCOPY  2015   [facility]   CT LUNG SCREENING  2018   DENTAL SURGERY  06/17/2021   4 teeth rmoved   DG  BONE DENSITY ([facility] HX)  2018   DIAGNOSTIC MAMMOGRAM  2019   GASTRIC BYPASS     LUNG CANCER SURGERY  02/2010   MULTIPLE TOOTH EXTRACTIONS     PORT A CATH REVISION Right 04/2021   PORT-A-CATH REMOVAL Left 02/07/2021   Procedure: REMOVAL PORT-A-CATH;  Surgeon: Kinsinger, De Blanch, MD;  Location: [facility] ORS;  Service: General;  Laterality: Left;  60 MINUTES ROOM 4   SPINAL CORD STIMULATOR IMPLANT     Family History Family History  Problem Relation Age of Onset   Hypertension Mother    GER disease Mother    Dementia Mother    Osteoporosis Mother    Arthritis Mother    Bipolar disorder Mother    Parkinson's disease Father    Congestive Heart Failure Father    Pneumonia Father    Arthritis Father    Dementia Father    Depression Father    Asthma Sister    Depression Sister    Bipolar disorder Brother    Asthma Daughter    Colon cancer Neg Hx    Esophageal cancer Neg Hx    Stomach cancer Neg Hx  Rectal cancer Neg Hx     Social History Social History   Tobacco Use   Smoking status: Never   Smokeless tobacco: Never  Vaping Use   Vaping Use: Never used  Substance Use Topics   Alcohol use: Not Currently   Drug use: Never   Allergies Butorphanol, Erythromycin base, Propoxyphene,  Sumatriptan, Tetracycline, Ciprofloxacin, Corticosteroids, Doxycycline, Cefixime, Gabapentin, Isometheptene-dichloral-apap, Ketorolac, Prednisone, Promethazine, and Tape  Review of Systems Review of Systems  Constitutional:  Positive for fatigue.  Neurological:  Positive for headaches.    Physical Exam Vital Signs  I have reviewed the triage vital signs BP (!) 108/56   Pulse 66   Temp 98.3 F (36.8 C)   Resp 18   SpO2 96%   Physical Exam Vitals and nursing note reviewed.  Constitutional:      General: She is not in acute distress.    Appearance: She is well-developed.  HENT:     Head: Normocephalic and atraumatic.  Eyes:     Conjunctiva/sclera: Conjunctivae normal.  Cardiovascular:     Rate and Rhythm: Normal rate and regular rhythm.     Heart sounds: No murmur heard. Pulmonary:     Effort: Pulmonary effort is normal. No respiratory distress.     Breath sounds: Normal breath sounds.  Abdominal:     Palpations: Abdomen is soft.     Tenderness: There is no abdominal tenderness.  Musculoskeletal:        General: No swelling.     Cervical back: Neck supple.  Skin:    General: Skin is warm and dry.     Capillary Refill: Capillary refill takes less than 2 seconds.  Neurological:     Mental Status: She is alert.  Psychiatric:        Mood and Affect: Mood normal.     ED Results and Treatments Labs (all labs ordered are listed, but only abnormal results are displayed) Labs Reviewed  COMPREHENSIVE METABOLIC PANEL - Abnormal; Notable for the following components:      Result Value   BUN 60 (*)    Creatinine, Ser 1.53 (*)    Calcium 8.5 (*)    Albumin 2.9 (*)    GFR, Estimated 35 (*)    All other components within normal limits  CBC WITH DIFFERENTIAL/PLATELET                                                                                                                          Radiology No results found.  Pertinent labs & imaging results that were available  during my care of the patient were reviewed by me and considered in my medical decision making (see MDM for details).  Medications Ordered in ED Medications  lactated ringers bolus 1,000 mL (0 mLs Intravenous Stopped 01/18/23 1640)  metoCLOPramide (REGLAN) injection 10 mg (10 mg Intravenous Given 01/18/23 1526)  diphenhydrAMINE (BENADRYL) injection 25 mg (25 mg Intravenous Given 01/18/23 1526)  heparin lock flush 100 UNIT/ML injection (  Given 01/18/23 1640)                                                                                                                                     Procedures Procedures  (including critical care time)  Medical Decision Making / ED Course   This patient presents to the ED for concern of hypotension, headache, this involves an extensive number of treatment options, and is a complaint that carries with it a high risk of complications and morbidity.  The differential diagnosis includes dysautonomia, orthostatic hypotension, bacteremia, hypoalbuminemia, dehydration  MDM: Patient seen emergency room for evaluation of hypertension and headache.  Physical exam is largely unremarkable.  Blood pressures are normal here in the emergency department but patient is complaining of a headache and thus received a headache cocktail with additional fluid resuscitation.  Laboratory evaluation with a BUN of 60, creatinine 1.53 but this is improved from 2 weeks ago.  Albumin is low at 2.9.  On reevaluation, her symptoms have significantly improved and blood pressures have remained stable.  At this time she does not meet inpatient criteria for admission she is safe for discharge with outpatient follow-up.  She was encouraged to increase protein intake if possible.   Additional history obtained: -Additional history obtained from husband -External records from outside source obtained and reviewed including: Chart review including previous notes, labs, imaging, consultation  notes   Lab Tests: -I ordered, reviewed, and interpreted labs.   The pertinent results include:   Labs Reviewed  COMPREHENSIVE METABOLIC PANEL - Abnormal; Notable for the following components:      Result Value   BUN 60 (*)    Creatinine, Ser 1.53 (*)    Calcium 8.5 (*)    Albumin 2.9 (*)    GFR, Estimated 35 (*)    All other components within normal limits  CBC WITH DIFFERENTIAL/PLATELET       Medicines ordered and prescription drug management: Meds ordered this encounter  Medications   lactated ringers bolus 1,000 mL   metoCLOPramide (REGLAN) injection 10 mg   diphenhydrAMINE (BENADRYL) injection 25 mg   heparin lock flush 100 UNIT/ML injection    Kent, Quieschae B: cabinet override    -I have reviewed the patients home medicines and have made adjustments as needed  Critical interventions none   Cardiac Monitoring: The patient was maintained on a cardiac monitor.  I personally viewed and interpreted the cardiac monitored which showed an underlying rhythm of: NSR  Social Determinants of Health:  Factors impacting patients care include: none   Reevaluation: After the interventions noted above, I reevaluated the patient and found that they have :improved  Co morbidities that complicate the patient evaluation  Past Medical History:  Diagnosis Date   Anemia    Arachnoiditis    Bipolar disorder (HCC)    Cataract    removed   Chronic post-thoracotomy pain    CKD (chronic kidney  disease)    GERD (gastroesophageal reflux disease)    Hypogammaglobulinemia (HCC)    Hypotension    Hypothyroid    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HCC)    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HCC)    Lung cancer (HCC) 2011, 2014   Memory loss    Migraine    Opioid abuse (HCC)    Osteoporosis    PMR (polymyalgia rheumatica) (HCC) 03/17/2022   Presence of neurostimulator    Restless legs    Sleep apnea       Dispostion: I considered admission for this patient, but at this time  she does not meet inpatient criteria for admission and she is safe for discharge with outpatient follow-up     Final Clinical Impression(s) / ED Diagnoses Final diagnoses:  Hypotension, unspecified hypotension type  Acute nonintractable headache, unspecified headache type     @PCDICTATION @    Glendora Score, MD 01/18/23 2224

## 2023-01-18 NOTE — ED Triage Notes (Signed)
Pt BIBA from home with c/o hypotension 60-80 sys. Had immunoglobulin treatment at home prior to calling EMS. Generalized weakness. Chest pain nonspecific, intermittent. Denies SOB, N/V/D.  BP 90/40 CBG 104 RR 18 HR 80 98% RA

## 2023-01-19 ENCOUNTER — Other Ambulatory Visit (HOSPITAL_COMMUNITY): Payer: Self-pay

## 2023-01-19 MED ORDER — DENOSUMAB 60 MG/ML ~~LOC~~ SOSY
PREFILLED_SYRINGE | SUBCUTANEOUS | 0 refills | Status: DC
  Filled 2023-01-19: qty 1, fill #0
  Filled 2023-01-20: qty 1, 180d supply, fill #0

## 2023-01-19 NOTE — Telephone Encounter (Signed)
Pt ready for scheduling for Prolia on or after : 02/05/23  Out-of-pocket cost due at time of visit: $40  Primary: [employer] - Medicare Prolia co-insurance: 0% Admin fee co-insurance: $40  Secondary: N/A Prolia co-insurance:  Admin fee co-insurance:   Medical Benefit Details: Date Benefits were checked: 01/19/23 Deductible: no/ Coinsurance: 0%/ Admin Fee: $40  Prior Auth: not required PA# Expiration Date:    Pharmacy benefit: Copay $0 If patient wants fill through the pharmacy benefit please send prescription to:  [facility] , and include estimated need by date in rx notes. Pharmacy will ship medication directly to the office.  Patient NOT eligible for Prolia Copay Card. Copay Card can make patient's cost as little as $25. Link to apply: [email or web address]://www.amgensupportplus.[email or web address]/copay  ** This summary of benefits is an estimation of the patient's out-of-pocket cost. Exact cost may very based on individual plan coverage.

## 2023-01-19 NOTE — Telephone Encounter (Signed)
Pt scheduled for 02/10/23.  She will get from pharmacy.  Rx sent to pharmacy.

## 2023-01-20 ENCOUNTER — Other Ambulatory Visit: Payer: Self-pay

## 2023-01-20 ENCOUNTER — Other Ambulatory Visit (HOSPITAL_COMMUNITY): Payer: Self-pay

## 2023-01-20 ENCOUNTER — Other Ambulatory Visit: Payer: Self-pay | Admitting: Family Medicine

## 2023-01-22 ENCOUNTER — Telehealth: Payer: Self-pay

## 2023-01-22 NOTE — Telephone Encounter (Signed)
Transition Care Management Unsuccessful Follow-up Telephone Call  Date of discharge and from where:  [facility] 6/3  Attempts:  1st Attempt  Reason for unsuccessful TCM follow-up call:  Left voice message   Megan Brewer [facility] Guide, [facility] [phone] [address] [place], [place], [place] [address] Phone: [phone] Email: [email or web address]

## 2023-01-22 NOTE — Telephone Encounter (Signed)
Transition Care Management Unsuccessful Follow-up Telephone Call  Date of discharge and from where:  [facility]  Attempts:  2nd Attempt  Reason for unsuccessful TCM follow-up call:  Left voice message   Celenia Hruska [facility] Guide, [facility] [phone] [address], [place], [place] [address] Phone: [phone] Email: [email or web address]       

## 2023-01-25 ENCOUNTER — Other Ambulatory Visit: Payer: Self-pay | Admitting: Family Medicine

## 2023-01-25 ENCOUNTER — Ambulatory Visit: Payer: Medicare PPO | Admitting: Family Medicine

## 2023-01-25 ENCOUNTER — Encounter: Payer: Self-pay | Admitting: Family Medicine

## 2023-01-25 VITALS — BP 90/62 | HR 91 | Temp 97.6°F | Ht 63.5 in | Wt 141.0 lb

## 2023-01-25 DIAGNOSIS — I951 Orthostatic hypotension: Secondary | ICD-10-CM

## 2023-01-25 DIAGNOSIS — R42 Dizziness and giddiness: Secondary | ICD-10-CM

## 2023-01-25 MED ORDER — MIDODRINE HCL 10 MG PO TABS: 10.0000 mg | ORAL_TABLET | Freq: Two times a day (BID) | ORAL | 1 refills | Status: DC

## 2023-01-25 NOTE — Progress Notes (Signed)
Chief Complaint  Patient presents with   Follow-up    Discuss testing for ATTR     Subjective: Patient is a 77 y.o. female here for f/u.  She is here with her spouse.  Patient has been dealing with hypotension.  Blood pressures will drop into the 80s.  She is prescribed midodrine 5 mg twice daily by the hospital team.  She does not feel that it is helping.  She does her best to stay hydrated and consumes around 80-100 ounces of water daily.  She does not sweat profusely or have any diarrhea/urinary frequency.  She is not able to get up very often but does feel lightheaded with some position changes.  She has an appointment with her cardiologist next week.  She is also wondering if she may have a form of amyloid.  She is requesting testing.  Most recent echo only showed mild LVH.  CT of the head did not show any masses.  Past Medical History:  Diagnosis Date   Anemia    Arachnoiditis    Bipolar disorder (HCC)    Cataract    removed   Chronic post-thoracotomy pain    CKD (chronic kidney disease)    GERD (gastroesophageal reflux disease)    Hypogammaglobulinemia (HCC)    Hypotension    Hypothyroid    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HCC)    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HCC)    Lung cancer (HCC) 2011, 2014   Memory loss    Migraine    Opioid abuse (HCC)    Osteoporosis    PMR (polymyalgia rheumatica) (HCC) 03/17/2022   Presence of neurostimulator    Restless legs    Sleep apnea     Objective: BP 90/62 (BP Location: Left Arm, Cuff Size: Normal)   Pulse 91   Temp 97.6 F (36.4 C) (Oral)   Ht 5' 3.5" (1.613 m)   Wt 141 lb (64 kg)   SpO2 99%   BMI 24.59 kg/m  General: Awake, appears stated age Heart: RRR, no LE edema Lungs: CTAB, no rales, wheezes or rhonchi. No accessory muscle use Psych: Age appropriate judgment and insight, normal affect and mood  Assessment and Plan: Orthostatic hypotension  Lightheaded - Plan: GeneSeq PLUS, TTR  Chronic, uncontrolled.   Increase midodrine from 5 mg twice daily to 10 mg twice daily.  Follow-up in 3 weeks.  Stay hydrated.  Continue fludrocortisone 0.1 mg daily.  Consider compression stockings.  Could consider Levine protocol. Will order TTR testing at the patient's request.  I did state that I have never ordered this before and believe this is the correct test.  The patient and her husband were pleased we are considering and ordering this.  The patient and her spouse voiced understanding and agreement to the plan.  Jilda Roche [place], DO 01/25/23  4:39 PM

## 2023-01-27 ENCOUNTER — Encounter: Payer: Self-pay | Admitting: Family Medicine

## 2023-01-27 ENCOUNTER — Other Ambulatory Visit: Payer: Self-pay | Admitting: Family Medicine

## 2023-01-27 DIAGNOSIS — M62838 Other muscle spasm: Secondary | ICD-10-CM | POA: Diagnosis not present

## 2023-01-27 DIAGNOSIS — M542 Cervicalgia: Secondary | ICD-10-CM | POA: Diagnosis not present

## 2023-02-02 ENCOUNTER — Other Ambulatory Visit (HOSPITAL_COMMUNITY): Payer: Self-pay

## 2023-02-03 ENCOUNTER — Ambulatory Visit
Admission: RE | Admit: 2023-02-03 | Discharge: 2023-02-03 | Disposition: A | Discharge status: Home / Self Care | Payer: Medicare PPO | Source: Ambulatory Visit | Attending: Sports Medicine | Admitting: Sports Medicine

## 2023-02-03 ENCOUNTER — Ambulatory Visit
Admission: RE | Admit: 2023-02-03 | Discharge: 2023-02-03 | Disposition: A | Discharge status: Home / Self Care | Payer: Medicare PPO | Source: Ambulatory Visit | Attending: Family Medicine | Admitting: Family Medicine

## 2023-02-03 DIAGNOSIS — Z1231 Encounter for screening mammogram for malignant neoplasm of breast: Secondary | ICD-10-CM | POA: Diagnosis not present

## 2023-02-03 DIAGNOSIS — M25552 Pain in left hip: Secondary | ICD-10-CM

## 2023-02-04 ENCOUNTER — Ambulatory Visit (INDEPENDENT_AMBULATORY_CARE_PROVIDER_SITE_OTHER): Payer: Medicare PPO

## 2023-02-04 VITALS — BP 94/61 | HR 73 | Temp 98.0°F | Resp 12 | Ht 64.5 in | Wt 141.0 lb

## 2023-02-04 DIAGNOSIS — M353 Polymyalgia rheumatica: Secondary | ICD-10-CM | POA: Diagnosis not present

## 2023-02-04 DIAGNOSIS — M2550 Pain in unspecified joint: Secondary | ICD-10-CM

## 2023-02-04 MED ORDER — SODIUM CHLORIDE 0.9 % IV SOLN
1000.0000 mg | Freq: Once | INTRAVENOUS | Status: AC
  Administered 2023-02-04: 1000 mg via INTRAVENOUS
  Filled 2023-02-04: qty 16

## 2023-02-04 MED ORDER — HEPARIN SOD (PORK) LOCK FLUSH 100 UNIT/ML IV SOLN
500.0000 [IU] | Freq: Once | INTRAVENOUS | Status: AC | PRN
  Administered 2023-02-04: 500 [IU]
  Filled 2023-02-04: qty 5

## 2023-02-04 NOTE — Progress Notes (Signed)
Diagnosis: polyarthralgia  Provider:  Chilton Greathouse MD  Procedure: IV Infusion  IV Type: Port a Cath, IV Location: R Chest  Solumedrol (Methylprednisolone), Dose: 1000 mg  Infusion Start Time: 1305  Infusion Stop Time: 1400  Post Infusion IV Care: port a cath flushed with NS and heparin locked  Discharge: Condition: Stable, Destination: Home . AVS Provided  Performed by:  Sandie Ano, RN     BP 78/49, patient reports her BP fluctuates and often drops. She reports feeling tired and "spacey." Offered to call EMS, patient and family declined at this time as BP increased to 94/61. They have an at home BP monitor and have been advised to go to the emergency department if blood pressure drops under 90/60. Patient verbalized understanding and has no further questions.   Sandie Ano, RN

## 2023-02-05 ENCOUNTER — Other Ambulatory Visit: Payer: Self-pay | Admitting: Family Medicine

## 2023-02-05 ENCOUNTER — Telehealth: Payer: Self-pay | Admitting: Family Medicine

## 2023-02-05 MED ORDER — ONDANSETRON HCL 8 MG PO TABS: 8.0000 mg | ORAL_TABLET | Freq: Three times a day (TID) | ORAL | 0 refills | Status: DC | PRN

## 2023-02-05 NOTE — Telephone Encounter (Signed)
Pt states after leaving [facility] and going to [facility] she was given zofran and is really hoping pcp can refill this rx as she is not feeling well. Please advise.   [facility] - [place], [place] - [address], [place] [address] Phone: [phone]  Fax: [phone]

## 2023-02-06 DIAGNOSIS — M62838 Other muscle spasm: Secondary | ICD-10-CM | POA: Diagnosis not present

## 2023-02-06 DIAGNOSIS — M542 Cervicalgia: Secondary | ICD-10-CM | POA: Diagnosis not present

## 2023-02-07 NOTE — Progress Notes (Unsigned)
Cardiology Office Note:   Date:  02/09/2023  ID:  Megan Brewer [birth year], MRN [id number] PCP: Megan Dory, DO  [facility] Providers Cardiologist:  Rollene Rotunda, MD {  History of Present Illness:   Megan Brewer is a 77 y.o. female  who presents for evaluation of chest pain.  She was seen by Dr. Tomie China previously.    She was in the emergency room recently with low blood pressures.  She has had orthostatic hypotension treated with midodrine.  She has multiple other medical problems.  I reviewed the emergency room records the other day and her blood pressure readings at this time at home have been in the 60s and she was transferred via EMS.  Her blood pressure did come up and she was treated with IV hydration.   She was in the hospital in 2023 with sepsis.  At that time because she had an elevated troponin.  She had a CT that ruled out a thoracic aneurysm or pulmonary embolism.  Echo was unremarkable.       It was suggested that she come to see [place] today because of continued low blood pressures.  She gets around only with a wheelchair in the house although she can transfer from her bed to the wheelchair.  She has had progressive profound weakness.  Initially in the emergency room her systolic blood pressure was in the 50s repeated and verified although it did come up by the time of transport via EMS.  She had progressive weakness.  She is not describing fevers or chills.  She is not having any new chest pressure, neck or arm discomfort.  I have not seen her since 10/22.  At that time she was seen because of some elevated troponin.  This was noted at that time of the hospitalization for sepsis.  She had a perfusion study was unremarkable.  Echocardiogram demonstrated EF 60 to 65%.  There were no significant valvular abnormalities.  ROS: As stated in the HPI and negative for all other systems.  Studies Reviewed:    EKG:   EKG  Interpretation  Date/Time:  Tuesday February 09 2023 16:55:29 EDT Ventricular Rate:  81 PR Interval:  140 QRS Duration: 142 QT Interval:  400 QTC Calculation: 464 R Axis:   20 Text Interpretation: Normal sinus rhythm Left bundle branch block When compared with ECG of 22-Dec-2022 10:06, No significant change since last tracing Confirmed by Rollene Rotunda ([id number]) on 02/09/2023 5:16:44 PM    Risk Assessment/Calculations:              Physical Exam:   VS:  BP (!) 86/56 (BP Location: Left Arm, Patient Position: Sitting, Cuff Size: Normal)   Pulse 80   Ht 5\' 3"  (1.6 m)   Wt 141 lb (64 kg)   SpO2 98%   BMI 24.98 kg/m    Wt Readings from Last 3 Encounters:  02/09/23 141 lb (64 kg)  02/04/23 141 lb (64 kg)  01/25/23 141 lb (64 kg)     GEN: Acutely ill-appearing NECK: No JVD; No carotid bruits CARDIAC: RRR, no murmurs, rubs, gallops RESPIRATORY:  Clear to auscultation without rales, wheezing or rhonchi  ABDOMEN: Soft, non-tender, non-distended EXTREMITIES:  None pitting edema; No deformity   ASSESSMENT AND PLAN:    Mild to Mod MR and TR: I will plan electively to repeat an echocardiogram.  This could be done in the hospital if she is admitted otherwise she will be notified to an outpatient.  Of note I do see some testing outstanding for TTR.   Hypotension: Her blood pressure profoundly low on initial evaluation.  She is severely weak.  At this point she needs to be transported to the emergency room and EMS was called.  For now she will continue the meds as listed.  Of note she has to continue her diuretic she says because she would have increased lower extremity swelling.  If there is no acute reversible etiology to the profound hypotension (systolic initially 52 mmHg) we could consider increased fludrocortisone    Follow up with me after hospitalization.    Signed, Rollene Rotunda, MD

## 2023-02-09 ENCOUNTER — Other Ambulatory Visit: Payer: Self-pay

## 2023-02-09 ENCOUNTER — Emergency Department (HOSPITAL_COMMUNITY)
Admission: EM | Admit: 2023-02-09 | Discharge: 2023-02-09 | Disposition: A | Discharge status: Home / Self Care | Payer: Medicare PPO | Attending: Student | Admitting: Student

## 2023-02-09 ENCOUNTER — Ambulatory Visit: Payer: Medicare PPO | Attending: Cardiology | Admitting: Cardiology

## 2023-02-09 ENCOUNTER — Encounter: Payer: Self-pay | Admitting: Cardiology

## 2023-02-09 VITALS — BP 86/56 | HR 80 | Ht 63.0 in | Wt 141.0 lb

## 2023-02-09 DIAGNOSIS — I10 Essential (primary) hypertension: Secondary | ICD-10-CM | POA: Diagnosis not present

## 2023-02-09 DIAGNOSIS — R072 Precordial pain: Secondary | ICD-10-CM | POA: Diagnosis not present

## 2023-02-09 DIAGNOSIS — E876 Hypokalemia: Secondary | ICD-10-CM | POA: Insufficient documentation

## 2023-02-09 DIAGNOSIS — Z85118 Personal history of other malignant neoplasm of bronchus and lung: Secondary | ICD-10-CM | POA: Insufficient documentation

## 2023-02-09 DIAGNOSIS — I340 Nonrheumatic mitral (valve) insufficiency: Secondary | ICD-10-CM

## 2023-02-09 DIAGNOSIS — R944 Abnormal results of kidney function studies: Secondary | ICD-10-CM | POA: Diagnosis not present

## 2023-02-09 DIAGNOSIS — R0602 Shortness of breath: Secondary | ICD-10-CM

## 2023-02-09 DIAGNOSIS — I509 Heart failure, unspecified: Secondary | ICD-10-CM | POA: Diagnosis not present

## 2023-02-09 DIAGNOSIS — E039 Hypothyroidism, unspecified: Secondary | ICD-10-CM | POA: Insufficient documentation

## 2023-02-09 DIAGNOSIS — I130 Hypertensive heart and chronic kidney disease with heart failure and stage 1 through stage 4 chronic kidney disease, or unspecified chronic kidney disease: Secondary | ICD-10-CM | POA: Insufficient documentation

## 2023-02-09 DIAGNOSIS — I959 Hypotension, unspecified: Secondary | ICD-10-CM | POA: Diagnosis not present

## 2023-02-09 DIAGNOSIS — N183 Chronic kidney disease, stage 3 unspecified: Secondary | ICD-10-CM | POA: Diagnosis not present

## 2023-02-09 DIAGNOSIS — I951 Orthostatic hypotension: Secondary | ICD-10-CM | POA: Insufficient documentation

## 2023-02-09 DIAGNOSIS — Z79899 Other long term (current) drug therapy: Secondary | ICD-10-CM | POA: Insufficient documentation

## 2023-02-09 DIAGNOSIS — R531 Weakness: Secondary | ICD-10-CM | POA: Diagnosis present

## 2023-02-09 LAB — TROPONIN I (HIGH SENSITIVITY)
Troponin I (High Sensitivity): 15 ng/L (ref <18)
Troponin I (High Sensitivity): 18 ng/L — ABNORMAL HIGH (ref <18)

## 2023-02-09 LAB — COMPREHENSIVE METABOLIC PANEL
ALT: 59 U/L — ABNORMAL HIGH (ref 0–44)
AST: 46 U/L — ABNORMAL HIGH (ref 15–41)
Albumin: 2.9 g/dL — ABNORMAL LOW (ref 3.5–5.0)
Alkaline Phosphatase: 63 U/L (ref 38–126)
Anion gap: 10 (ref 5–15)
BUN: 55 mg/dL — ABNORMAL HIGH (ref 8–23)
CO2: 25 mmol/L (ref 22–32)
Calcium: 8.9 mg/dL (ref 8.9–10.3)
Chloride: 102 mmol/L (ref 98–111)
Creatinine, Ser: 1.69 mg/dL — ABNORMAL HIGH (ref 0.44–1.00)
GFR, Estimated: 31 mL/min — ABNORMAL LOW (ref >60)
Glucose, Bld: 107 mg/dL — ABNORMAL HIGH (ref 70–99)
Potassium: 3.3 mmol/L — ABNORMAL LOW (ref 3.5–5.1)
Sodium: 137 mmol/L (ref 135–145)
Total Bilirubin: 0.5 mg/dL (ref 0.3–1.2)
Total Protein: 7 g/dL (ref 6.5–8.1)

## 2023-02-09 LAB — CBC WITH DIFFERENTIAL/PLATELET
Abs Immature Granulocytes: 0.06 10*3/uL (ref 0.00–0.07)
Basophils Absolute: 0.1 10*3/uL (ref 0.0–0.1)
Basophils Relative: 1 %
Eosinophils Absolute: 0 10*3/uL (ref 0.0–0.5)
Eosinophils Relative: 1 %
HCT: 38.7 % (ref 36.0–46.0)
Hemoglobin: 12.9 g/dL (ref 12.0–15.0)
Immature Granulocytes: 1 %
Lymphocytes Relative: 24 %
Lymphs Abs: 2 10*3/uL (ref 0.7–4.0)
MCH: 30.6 pg (ref 26.0–34.0)
MCHC: 33.3 g/dL (ref 30.0–36.0)
MCV: 91.9 fL (ref 80.0–100.0)
Monocytes Absolute: 0.6 10*3/uL (ref 0.1–1.0)
Monocytes Relative: 8 %
Neutro Abs: 5.3 10*3/uL (ref 1.7–7.7)
Neutrophils Relative %: 65 %
Platelets: 205 10*3/uL (ref 150–400)
RBC: 4.21 MIL/uL (ref 3.87–5.11)
RDW: 12.4 % (ref 11.5–15.5)
WBC: 8.1 10*3/uL (ref 4.0–10.5)
nRBC: 0 % (ref 0.0–0.2)

## 2023-02-09 MED ORDER — LACTATED RINGERS IV BOLUS
1000.0000 mL | Freq: Once | INTRAVENOUS | Status: AC
  Administered 2023-02-09: 1000 mL via INTRAVENOUS

## 2023-02-09 MED ORDER — HEPARIN SOD (PORK) LOCK FLUSH 100 UNIT/ML IV SOLN
500.0000 [IU] | Freq: Once | INTRAVENOUS | Status: AC
  Administered 2023-02-09: 500 [IU]
  Filled 2023-02-09: qty 5

## 2023-02-09 MED ORDER — ALBUMIN HUMAN 25 % IV SOLN
25.0000 g | Freq: Once | INTRAVENOUS | Status: AC
  Administered 2023-02-09: 25 g via INTRAVENOUS
  Filled 2023-02-09: qty 100

## 2023-02-09 NOTE — ED Triage Notes (Signed)
Pt arrives from cardiology office via EMS for report of hypotension. Pt has hx of hypotension, uses midodrine. Dr. Isidore Moos stated systolic BP in 50s. EMS reports systolic 120s. Pt denies new complaints.

## 2023-02-10 ENCOUNTER — Ambulatory Visit (INDEPENDENT_AMBULATORY_CARE_PROVIDER_SITE_OTHER): Payer: Medicare PPO

## 2023-02-10 ENCOUNTER — Other Ambulatory Visit: Payer: Self-pay

## 2023-02-10 ENCOUNTER — Emergency Department (HOSPITAL_BASED_OUTPATIENT_CLINIC_OR_DEPARTMENT_OTHER)
Admission: EM | Admit: 2023-02-10 | Discharge: 2023-02-10 | Disposition: A | Discharge status: Home / Self Care | Payer: Medicare PPO | Attending: Emergency Medicine | Admitting: Emergency Medicine

## 2023-02-10 ENCOUNTER — Encounter (HOSPITAL_BASED_OUTPATIENT_CLINIC_OR_DEPARTMENT_OTHER): Payer: Self-pay

## 2023-02-10 ENCOUNTER — Telehealth: Payer: Self-pay

## 2023-02-10 DIAGNOSIS — M810 Age-related osteoporosis without current pathological fracture: Secondary | ICD-10-CM | POA: Diagnosis not present

## 2023-02-10 DIAGNOSIS — I951 Orthostatic hypotension: Secondary | ICD-10-CM | POA: Insufficient documentation

## 2023-02-10 MED ORDER — DENOSUMAB 60 MG/ML ~~LOC~~ SOSY
60.0000 mg | PREFILLED_SYRINGE | Freq: Once | SUBCUTANEOUS | Status: AC
Start: 2023-02-10 — End: 2023-02-10
  Administered 2023-02-10: 60 mg via SUBCUTANEOUS

## 2023-02-10 MED ORDER — FLUDROCORTISONE ACETATE 0.1 MG PO TABS: 0.2000 mg | ORAL_TABLET | Freq: Every day | ORAL | 0 refills | Status: AC

## 2023-02-10 NOTE — Progress Notes (Signed)
Pt here for Prolia shot. Shot given in left subcutaneous. Pt handled well.   Pt's husband asked if we could check blood pressure since patient was in the ER last night due to low blood pressure. I spoke with Dr. Carmelia Roller for an order.  Wendling advised if blood pressure is still low to take patient to the ER. Pt was taken downstairs with a blood pressure of 70/60.

## 2023-02-10 NOTE — ED Triage Notes (Signed)
States hx of autonomic dysfunction, which causes low blood pressure. States her BP has been low this morning, got polio vaccine this morning and was advised by PCP to come be evaluated. Seen at [facility] yesterday for same.

## 2023-02-10 NOTE — ED Provider Notes (Signed)
[facility]GENC[facility] DE[facility]MENT AT [facility] Provider Note  CSN: [id number] Arrival date & time: 02/09/23 1728  Chief Complaint(s) Hypotension  HPI Megan Brewer is a 77 y.o. 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  She was seen at her cardiology office this morning where she was reportedly found to have systolics in the 50s and it was very weak in the office.  Upon EMS arrival to the office, her blood pressures had improved with systolics in the 120s and she does arrive with stable blood pressures here.  She does appear profoundly weak and states that things have been harder for her recently.  She does state that this happens to her frequently and she "would not have come to the emergency department if her cardiologist did not ask her to come".  I have personally seen this patient on 01/18/2023 and during that visit patient had extensive improvement with fluid resuscitation.  Currently patient denies chest pain, shortness of breath, Donnell pain, nausea, vomiting or other systemic symptoms.  Patient had IVIG yesterday.   Past Medical History Past Medical History:  Diagnosis Date   Anemia    Arachnoiditis    Bipolar disorder (HCC)    Cataract    removed   Chronic post-thoracotomy pain    CKD (chronic kidney disease)    GERD (gastroesophageal reflux disease)    Hypogammaglobulinemia (HCC)    Hypotension    Hypothyroid    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HCC)    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HCC)    Lung cancer (HCC) 2011, 2014   Memory loss    Migraine    Opioid abuse (HCC)    Osteoporosis    PMR (polymyalgia rheumatica) (HCC) 03/17/2022   Presence of neurostimulator    Restless legs    Sleep apnea     Patient Active Problem List   Diagnosis Date Noted   Orthostatic hypotension 12/22/2022   Near syncope 12/22/2022   Bipolar mixed affective disorder, moderate (HCC) 08/25/2022   Tremor 08/25/2022   Polyarthralgia 05/04/2022   Polymyalgia rheumatica (HCC) 03/25/2022   Thrombocytopenia, unspecified (HCC) 12/30/2021   Upper airway cough syndrome with ? VCD  06/26/2021   Precordial chest pain 06/05/2021   Moderate mitral regurgitation 06/05/2021   Essential hypertension 06/05/2021   DOE (dyspnea on exertion) 06/05/2021   Medication monitoring encounter 04/01/2021   Gram-negative bacteremia 03/03/2021   Rigors 02/04/2021   Elevated troponin 01/14/2021   Hypokalemia 01/14/2021   Headache 01/14/2021   Chest discomfort 03/22/2019   Weight loss 01/27/2019   Acquired hypothyroidism 01/27/2019   Esophagitis 01/27/2019   Intractable migraine without status migrainosus 01/27/2019   Osteoporosis 01/27/2019   Chronic sinusitis 12/28/2018   Food intolerance/GI symptoms 12/28/2018   Acute maxillary sinusitis 12/28/2018   Lower leg edema 12/20/2018   Chronic pain of both shoulders 05/24/2018   Bilateral leg edema 05/24/2018   Chronic pain syndrome 03/31/2018   Left bundle branch block 10/28/2017   Palpitations 10/28/2017   History of gastric bypass 05/21/2017   Polypharmacy 02/11/2017   Senile nuclear sclerosis 11/26/2016   Chronic post-thoracotomy pain 04/09/2014   Chronic kidney disease 04/09/2014   Sleep disturbances 09/20/2012   Restless legs syndrome 07/07/2012   Hypertonicity of bladder 05/23/2012   Essential tremor 04/01/2012   Memory loss 04/01/2012  Incomplete emptying of bladder 01/14/2012   Urge incontinence 10/28/2011   Tension type headache 06/10/2011   Mixed urge and stress incontinence 05/28/2011   Hypogammaglobulinemia (HCC) 06/22/2010   Severe episode of recurrent major depressive disorder, without psychotic features (HCC) 06/22/2010   Hypothyroidism 02/12/2010    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/2010   ARACHNOIDITIS 01/31/2010   OBSTRUCTIVE SLEEP APNEA 01/31/2010   Chronic rhinitis 01/31/2010   Arachnoiditis 01/31/2010   Home Medication(s) Prior to Admission medications   Medication Sig Start Date End Date Taking? Authorizing Provider  acetaminophen (TYLENOL) 500 MG tablet Take 1,000 mg by mouth as needed for moderate pain.    [provider]  azelastine (ASTELIN) 0.1 % nasal spray Place 2 sprays into both nostrils 2 (two) times daily. Patient taking differently: Place 2 sprays into both nostrils as needed for rhinitis or allergies. 03/10/21   Hetty Blend, FNP  baclofen (LIORESAL) 10 MG tablet Take 10 mg by mouth daily as needed for muscle spasms. 06/03/20   [provider]  CALCIUM PO Take 1 tablet by mouth in the morning and at bedtime.    [provider]  cholecalciferol (VITAMIN D3) 25 MCG (1000 UNIT) tablet Take 1,000 Units by mouth daily.    [provider]  cycloSPORINE (RESTASIS) 0.05 % ophthalmic emulsion Place 1 drop into both eyes in the morning, at noon, in the evening, and at bedtime.    [provider]  denosumab (PROLIA) 60 MG/ML SOSY injection Pt to get at office.  Appt is 02/10/23 01/19/23   Sharlene Dory, DO  dexlansoprazole (DEXILANT) 60 MG capsule Take 1 capsule (60 mg total) by mouth daily with breakfast. 04/30/22   Esterwood, Amy S, PA-C  famotidine (PEPCID) 20 MG tablet Take 1 tablet (20 mg total) by mouth at bedtime. 04/30/22   Esterwood, Amy S, PA-C  ferrous sulfate 325 (65 FE) MG tablet Take 650 mg by mouth daily with breakfast.    [provider]  fludrocortisone (FLORINEF) 0.1 MG tablet Take 2 tablets (0.2 mg total) by mouth daily for 30 doses. 02/10/23 03/12/23  Curatolo, Adam, DO  GAMMAGARD 20 GM/200ML SOLN Inject 40 g into the vein every 21 ( twenty-one) days. INFUSE 40G INTRAVENOUSLY EVERY 3 WEEKS 02/27/22   [provider]  heparin lock flush 100  UNIT/ML SOLN injection Inject 500 Units into the vein every 21 ( twenty-one) days. 03/31/21   [provider]  ibuprofen (ADVIL) 200 MG tablet Take 600 mg by mouth as needed for mild pain.    [provider]  KEVZARA 200 MG/1. SOAJ Inject 1.14 mLs into the skin every 14 (fourteen) days. 09/09/22   [provider]  levothyroxine (SYNTHROID) 75 MCG tablet TAKE ONE (1) TABLET BY MOUTH EACH DAY BEFORE BREAKFAST ON EMPTY STOMACH Patient taking differently: Take 75 mcg by mouth daily before breakfast. 30 MINUTES BEFORE BREAKFAST ON EMPTY STOMACH. 09/11/22   Sharlene Dory, DO  magnesium oxide (MAG-OX) 400 MG tablet Take 400 mg by mouth daily.     [provider]  midodrine (PROAMATINE) 10 MG tablet Take 1 tablet (10 mg total) by mouth in the morning and at bedtime. 01/25/23   Sharlene Dory, DO  mupirocin ointment (BACTROBAN) 2 % Apply 1 application. topically 2 (two) times daily. 12/29/21   Sharon Seller, NP  MYRBETRIQ 50 MG TB24 tablet Take 50 mg by mouth daily. 10/07/22   [provider]  ondansetron (ZOFRAN) 8 MG tablet Take 1  tablet (8 mg total) by mouth every 8 (eight) hours as needed for nausea or vomiting. 02/05/23   Carmelia Roller, Jilda Roche, DO  potassium chloride (KLOR-CON) 10 MEQ tablet Take 2 tablets (20 mEq total) by mouth 2 (two) times daily. 12/29/22   Sharlene Dory, DO  potassium chloride SA (KLOR-CON M) 20 MEQ tablet Take 20 mEq by mouth 6 (six) times daily.    [provider]  pyridoxine (B-6) 100 MG tablet Take 100 mg by mouth daily.    Sharlene Dory, DO  rOPINIRole (REQUIP) 0.5 MG tablet Take 0.5 mg by mouth 3 (three) times daily.    Sharlene Dory, DO  sucralfate (CARAFATE) 1 GM/10ML suspension TAKE 10 MLS (1 G TOTAL) BY MOUTH 4 TIMESDAILY AS NEEDED 01/25/23   Sharlene Dory, DO  tamsulosin (FLOMAX) 0.4 MG CAPS capsule Take 1 capsule (0.4 mg total) by mouth daily.  01/23/21   Burnadette Pop, MD  torsemide (DEMADEX) 20 MG tablet Take 1 tablet (20 mg total) by mouth daily as needed (fluid, edema). 12/24/22   Leatha Gilding, MD  triamcinolone cream (KENALOG) 0.1 % Apply 1 application  topically 2 (two) times daily as needed (rash).    [provider]  vitamin B-12 (CYANOCOBALAMIN) 1000 MCG tablet Take 1,000 mcg by mouth daily. 02/14/21   [provider]  Vitamin D, Ergocalciferol, (DRISDOL) 1.25 MG (50000 UNIT) CAPS capsule TAKE 1 CAPSULE ONCE WEEKLY 01/04/23   [name], Drema Pry, MD  ZINC OXIDE, TOPICAL, 10 % CREA Apply 1 application topically 2 (two) times daily as needed (rash). 02/14/21   Glade Lloyd, MD                                                                                                                                    Past Surgical History Past Surgical History:  Procedure Laterality Date   ABDOMINAL HYSTERECTOMY     CARPAL TUNNEL RELEASE     CATARACT EXTRACTION Bilateral 2019   CHOLECYSTECTOMY     COLONOSCOPY  2015   [facility]   CT LUNG SCREENING  2018   DENTAL SURGERY  06/17/2021   4 teeth rmoved   DG  BONE DENSITY ([facility] HX)  2018   DIAGNOSTIC MAMMOGRAM  2019   GASTRIC BYPASS     LUNG CANCER SURGERY  02/2010   MULTIPLE TOOTH EXTRACTIONS     PORT A CATH REVISION Right 04/2021   PORT-A-CATH REMOVAL Left 02/07/2021   Procedure: REMOVAL PORT-A-CATH;  Surgeon: Sheliah Hatch De Blanch, MD;  Location: [facility];  Service: General;  Laterality: Left;  60 MINUTES ROOM 4   SPINAL CORD STIMULATOR IMPLANT     Family History Family History  Problem Relation Age of Onset   Hypertension Mother    GER disease Mother    Dementia Mother    Osteoporosis Mother    Arthritis Mother    Bipolar disorder Mother    Parkinson's  disease Father    Congestive Heart Failure Father    Pneumonia Father    Arthritis Father    Dementia Father    Depression Father    Asthma Sister    Depression Sister    Bipolar disorder Brother    Asthma  Daughter    Colon cancer Neg Hx    Esophageal cancer Neg Hx    Stomach cancer Neg Hx    Rectal cancer Neg Hx     Social History Social History   Tobacco Use   Smoking status: Never   Smokeless tobacco: Never  Vaping Use   Vaping Use: Never used  Substance Use Topics   Alcohol use: Not Currently   Drug use: Never   Allergies Butorphanol, Erythromycin base, Pregabalin, Pregabalin er, Propoxyphene, Sumatriptan, Tetracycline, Ciprofloxacin, Corticosteroids, Doxycycline, Cefixime, Gabapentin, Isometheptene-dichloral-apap, Ketorolac, Prednisone, Promethazine, and Tape  Review of Systems Review of Systems  Constitutional:  Positive for fatigue.  Neurological:  Positive for weakness.    Physical Exam Vital Signs  I have reviewed the triage vital signs BP (!) 118/57   Pulse 77   Temp 97.6 F (36.4 C) (Oral)   Resp 15   Wt 64 kg   SpO2 100%   BMI 24.98 kg/m   Physical Exam Vitals and nursing note reviewed.  Constitutional:      General: She is not in acute distress.    Appearance: She is well-developed. She is ill-appearing.  HENT:     Head: Normocephalic and atraumatic.  Eyes:     Conjunctiva/sclera: Conjunctivae normal.  Cardiovascular:     Rate and Rhythm: Normal rate and regular rhythm.     Heart sounds: No murmur heard. Pulmonary:     Effort: Pulmonary effort is normal. No respiratory distress.  Musculoskeletal:        General: No swelling.     Cervical back: Neck supple.  Skin:    General: Skin is warm and dry.     Capillary Refill: Capillary refill takes less than 2 seconds.  Neurological:     Mental Status: She is alert.  Psychiatric:        Mood and Affect: Mood normal.     ED Results and Treatments Labs (all labs ordered are listed, but only abnormal results are displayed) Labs Reviewed  COMPREHENSIVE METABOLIC PANEL - Abnormal; Notable for the following components:      Result Value   Potassium 3.3 (*)    Glucose, Bld 107 (*)    BUN 55 (*)     Creatinine, Ser 1.69 (*)    Albumin 2.9 (*)    AST 46 (*)    ALT 59 (*)    GFR, Estimated 31 (*)    All other components within normal limits  TROPONIN I (HIGH SENSITIVITY) - Abnormal; Notable for the following components:   Troponin I (High Sensitivity) 18 (*)    All other components within normal limits  CBC WITH DIFFERENTIAL/PLATELET  TROPONIN I (HIGH SENSITIVITY)  Radiology No results found.  Pertinent labs & imaging results that were available during my care of the patient were reviewed by me and considered in my medical decision making (see MDM for details).  Medications Ordered in ED Medications  lactated ringers bolus 1,000 mL (0 mLs Intravenous Stopped 02/09/23 2057)  albumin human 25 % solution 25 g (0 g Intravenous Stopped 02/09/23 2059)  heparin lock flush 100 unit/mL (500 Units Intracatheter Given 02/09/23 2101)                                                                                                                                     Procedures Procedures  (including critical care time)  Medical Decision Making / ED Course   This patient presents to the ED for concern of generalized weakness, fatigue, hypotension, this involves an extensive number of treatment options, and is a complaint that carries with it a high risk of complications and morbidity.  The differential diagnosis includes orthostatic hypotension, overdiuresis, electrolyte abnormality, dehydration, bacteremia  MDM: Patient seen emergency room for evaluation of orthostatic hypotension.  Physical exam reveals a fatigued appearing patient but is otherwise unremarkable.  No focal motor or sensory deficits.  Laboratory evaluation with a mild hypokalemia to 3.3, BUN 55, creatinine 1.69 which is near patient's baseline.  High-sensitivity troponin minimally elevated at 18.  Patient  received a 25 Veldman and 1 L lactated Ringer's and on reevaluation her symptoms have significant improved.  She is markedly more energetic and able to ambulate here in the emergency department.  At no point did the patient have any hypotension while here in the emergency department.  I do wonder if the patient would benefit from outpatient hydration in an infusion center as she does have a port in place.  I asked the family to speak with her primary care physician to see if this is something that may benefit the patient as she does come to the emergency department frequently for the same thing and improves with fluid resuscitation.  This may be able to decrease ER visits in the future.  At this time, patient does not meet inpatient criteria for admission and she is safe for discharge with outpatient follow-up   Additional history obtained: -Additional history obtained from husband -External records from outside source obtained and reviewed including: Chart review including previous notes, labs, imaging, consultation notes   Lab Tests: -I ordered, reviewed, and interpreted labs.   The pertinent results include:   Labs Reviewed  COMPREHENSIVE METABOLIC PANEL - Abnormal; Notable for the following components:      Result Value   Potassium 3.3 (*)    Glucose, Bld 107 (*)    BUN 55 (*)    Creatinine, Ser 1.69 (*)    Albumin 2.9 (*)    AST 46 (*)    ALT 59 (*)    GFR, Estimated 31 (*)    All other components  within normal limits  TROPONIN I (HIGH SENSITIVITY) - Abnormal; Notable for the following components:   Troponin I (High Sensitivity) 18 (*)    All other components within normal limits  CBC WITH DIFFERENTIAL/PLATELET  TROPONIN I (HIGH SENSITIVITY)      Medicines ordered and prescription drug management: Meds ordered this encounter  Medications   lactated ringers bolus 1,000 mL   albumin human 25 % solution 25 g   heparin lock flush 100 unit/mL    -I have reviewed the patients  home medicines and have made adjustments as needed  Critical interventions none    Cardiac Monitoring: The patient was maintained on a cardiac monitor.  I personally viewed and interpreted the cardiac monitored which showed an underlying rhythm of: NSR  Social Determinants of Health:  Factors impacting patients care include: none   Reevaluation: After the interventions noted above, I reevaluated the patient and found that they have :improved  Co morbidities that complicate the patient evaluation  Past Medical History:  Diagnosis Date   Anemia    Arachnoiditis    Bipolar disorder (HCC)    Cataract    removed   Chronic post-thoracotomy pain    CKD (chronic kidney disease)    GERD (gastroesophageal reflux disease)    Hypogammaglobulinemia (HCC)    Hypotension    Hypothyroid    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HCC)    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HCC)    Lung cancer (HCC) 2011, 2014   Memory loss    Migraine    Opioid abuse (HCC)    Osteoporosis    PMR (polymyalgia rheumatica) (HCC) 03/17/2022   Presence of neurostimulator    Restless legs    Sleep apnea       Dispostion: I considered admission for this patient, but at this time she does not meet inpatient criteria for admission and she is safe for discharge with outpatient follow-up     Final Clinical Impression(s) / ED Diagnoses Final diagnoses:  Orthostatic hypotension     @PCDICTATION @    Tenicia Gural, Wyn Forster, MD 02/10/23 1233

## 2023-02-10 NOTE — Discharge Instructions (Signed)
Increase your fludrocortisone to 0.2 mg tablets daily.  You are prescribed 0.1 mg tablets already, so take 2 tablets daily at dinnertime.

## 2023-02-10 NOTE — ED Provider Notes (Signed)
[place] [facility] Provider Note   CSN: [id number] Arrival date & time: 02/10/23  1110     History  Chief Complaint  Patient presents with   Hypotension    Megan Brewer is a 77 y.o. female.  Patient here for evaluation to get a low blood pressure.  History of autonomic dysfunction and orthostatic hypotension.  Takes midodrine and Florinef.  Was seen last night at [facility] for the same.  Had another episode of low blood pressure primary care doctor office and was sent for evaluation.  She denies any symptoms at this time.  She is wheelchair-bound.  She has been dealing with this for a while now.  Follows with cardiology as well.  The history is provided by the patient.       Home Medications Prior to Admission medications   Medication Sig Start Date End Date Taking? Authorizing Provider  acetaminophen (TYLENOL) 500 MG tablet Take 1,000 mg by mouth as needed for moderate pain.    [provider]  azelastine (ASTELIN) 0.1 % nasal spray Place 2 sprays into both nostrils 2 (two) times daily. Patient taking differently: Place 2 sprays into both nostrils as needed for rhinitis or allergies. 03/10/21   Hetty Blend, FNP  baclofen (LIORESAL) 10 MG tablet Take 10 mg by mouth daily as needed for muscle spasms. 06/03/20   [provider]  CALCIUM PO Take 1 tablet by mouth in the morning and at bedtime.    [provider]  cholecalciferol (VITAMIN D3) 25 MCG (1000 UNIT) tablet Take 1,000 Units by mouth daily.    [provider]  cycloSPORINE (RESTASIS) 0.05 % ophthalmic emulsion Place 1 drop into both eyes in the morning, at noon, in the evening, and at bedtime.    [provider]  denosumab (PROLIA) 60 MG/ML SOSY injection Pt to get at office.  Appt is 02/10/23 01/19/23   Sharlene Dory, DO  dexlansoprazole (DEXILANT) 60 MG capsule Take 1 capsule (60 mg total) by mouth daily with  breakfast. 04/30/22   Esterwood, Amy S, PA-C  famotidine (PEPCID) 20 MG tablet Take 1 tablet (20 mg total) by mouth at bedtime. 04/30/22   Esterwood, Amy S, PA-C  ferrous sulfate 325 (65 FE) MG tablet Take 650 mg by mouth daily with breakfast.    [provider]  fludrocortisone (FLORINEF) 0.1 MG tablet Take 2 tablets (0.2 mg total) by mouth daily for 30 doses. 02/10/23 03/12/23  Shamel Germond, DO  GAMMAGARD 20 GM/200ML SOLN Inject 40 g into the vein every 21 ( twenty-one) days. INFUSE 40G INTRAVENOUSLY EVERY 3 WEEKS 02/27/22   [provider]  heparin lock flush 100 UNIT/ML SOLN injection Inject 500 Units into the vein every 21 ( twenty-one) days. 03/31/21   [provider]  ibuprofen (ADVIL) 200 MG tablet Take 600 mg by mouth as needed for mild pain.    [provider]  KEVZARA 200 MG/1. SOAJ Inject 1.14 mLs into the skin every 14 (fourteen) days. 09/09/22   [provider]  levothyroxine (SYNTHROID) 75 MCG tablet TAKE ONE (1) TABLET BY MOUTH EACH DAY BEFORE BREAKFAST ON EMPTY STOMACH Patient taking differently: Take 75 mcg by mouth daily before breakfast. 30 MINUTES BEFORE BREAKFAST ON EMPTY STOMACH. 09/11/22   Sharlene Dory, DO  magnesium oxide (MAG-OX) 400 MG tablet Take 400 mg by mouth daily.     [provider]  midodrine (PROAMATINE) 10 MG tablet Take  1 tablet (10 mg total) by mouth in the morning and at bedtime. 01/25/23   Sharlene Dory, DO  mupirocin ointment (BACTROBAN) 2 % Apply 1 application. topically 2 (two) times daily. 12/29/21   Sharon Seller, NP  MYRBETRIQ 50 MG TB24 tablet Take 50 mg by mouth daily. 10/07/22   [provider]  ondansetron (ZOFRAN) 8 MG tablet Take 1 tablet (8 mg total) by mouth every 8 (eight) hours as needed for nausea or vomiting. 02/05/23   Carmelia Roller, Jilda Roche, DO  potassium chloride (KLOR-CON) 10 MEQ tablet Take 2 tablets (20 mEq total) by mouth 2 (two) times daily.  12/29/22   Sharlene Dory, DO  potassium chloride SA (KLOR-CON M) 20 MEQ tablet Take 20 mEq by mouth 6 (six) times daily.    [provider]  pyridoxine (B-6) 100 MG tablet Take 100 mg by mouth daily.    Sharlene Dory, DO  rOPINIRole (REQUIP) 0.5 MG tablet Take 0.5 mg by mouth 3 (three) times daily.    Sharlene Dory, DO  sucralfate (CARAFATE) 1 GM/10ML suspension TAKE 10 MLS (1 G TOTAL) BY MOUTH 4 TIMESDAILY AS NEEDED 01/25/23   Sharlene Dory, DO  tamsulosin (FLOMAX) 0.4 MG CAPS capsule Take 1 capsule (0.4 mg total) by mouth daily. 01/23/21   Burnadette Pop, MD  torsemide (DEMADEX) 20 MG tablet Take 1 tablet (20 mg total) by mouth daily as needed (fluid, edema). 12/24/22   Leatha Gilding, MD  triamcinolone cream (KENALOG) 0.1 % Apply 1 application  topically 2 (two) times daily as needed (rash).    [provider]  vitamin B-12 (CYANOCOBALAMIN) 1000 MCG tablet Take 1,000 mcg by mouth daily. 02/14/21   [provider]  Vitamin D, Ergocalciferol, (DRISDOL) 1.25 MG (50000 UNIT) CAPS capsule TAKE 1 CAPSULE ONCE WEEKLY 01/04/23   [name], Drema Pry, MD  ZINC OXIDE, TOPICAL, 10 % CREA Apply 1 application topically 2 (two) times daily as needed (rash). 02/14/21   Glade Lloyd, MD      Allergies    Butorphanol, Erythromycin base, Pregabalin, Pregabalin er, Propoxyphene, Sumatriptan, Tetracycline, Ciprofloxacin, Corticosteroids, Doxycycline, Cefixime, Gabapentin, Isometheptene-dichloral-apap, Ketorolac, Prednisone, Promethazine, and Tape    Review of Systems   Review of Systems  Physical Exam Updated Vital Signs BP 130/64 (BP Location: Right Arm)   Pulse 72   Temp 98.1 F (36.7 C) (Oral)   Resp 16   Ht 5\' 3"  (1.6 m)   Wt 63.5 kg   SpO2 99%   BMI 24.80 kg/m  Physical Exam Vitals and nursing note reviewed.  Constitutional:      General: She is not in acute distress.    Appearance: She is well-developed.  HENT:     Head:  Normocephalic and atraumatic.  Eyes:     Extraocular Movements: Extraocular movements intact.     Conjunctiva/sclera: Conjunctivae normal.     Pupils: Pupils are equal, round, and reactive to light.  Cardiovascular:     Rate and Rhythm: Normal rate and regular rhythm.     Pulses: Normal pulses.     Heart sounds: Normal heart sounds. No murmur heard. Pulmonary:     Effort: Pulmonary effort is normal. No respiratory distress.     Breath sounds: Normal breath sounds.  Abdominal:     Palpations: Abdomen is soft.     Tenderness: There is no abdominal tenderness.  Musculoskeletal:        General: No swelling.     Cervical back: Neck supple.  Skin:    General: Skin is warm and dry.     Capillary Refill: Capillary refill takes less than 2 seconds.  Neurological:     Mental Status: She is alert.  Psychiatric:        Mood and Affect: Mood normal.     ED Results / Procedures / Treatments   Labs (all labs ordered are listed, but only abnormal results are displayed) Labs Reviewed - No data to display  EKG None  Radiology No results found.  Procedures Procedures    Medications Ordered in ED Medications - No data to display  ED Course/ Medical Decision Making/ A&P                             Medical Decision Making Risk Prescription drug management.   L[place] Cragin is here for evaluation of her blood pressure.  History of orthostatic hypotension.  Seen yesterday for the same in the ED after having profound hypotension at cardiology office yesterday.  She is on midodrine and Florinef.  She is continue to have extensive workup for this but ultimately she had labs last night that were unremarkable.  No significant anemia or electrolyte abnormality or kidney injury.  Her vital signs are normal here.  Blood pressure normal both with lying down and sitting up.  Per her cardiology note they were going to increase her Florinef and overall we will have her increase in 0.1 mg daily  to 0.2 mg daily.  Will have her follow-up with primary doctor/cardiology which sounds that she has appointment with them next week already.  Overall I have no concern for infectious process or other acute process.  Discharged in good condition.  This chart was dictated using voice recognition software.  Despite best efforts to proofread,  errors can occur which can change the documentation meaning.         Final Clinical Impression(s) / ED Diagnoses Final diagnoses:  Orthostatic hypotension    Rx / DC Orders ED Discharge Orders          Ordered    fludrocortisone (FLORINEF) 0.1 MG tablet  Daily        02/10/23 1139              Cesar Alf, Madelaine Bhat, DO 02/10/23 1141

## 2023-02-10 NOTE — Telephone Encounter (Signed)
Pt received Prolia injection today. Next one due on/after 08/12/23.

## 2023-02-15 ENCOUNTER — Ambulatory Visit: Payer: Medicare PPO | Admitting: Family Medicine

## 2023-02-15 ENCOUNTER — Telehealth: Payer: Self-pay | Admitting: Family Medicine

## 2023-02-15 ENCOUNTER — Other Ambulatory Visit: Payer: Self-pay | Admitting: Family Medicine

## 2023-02-15 LAB — GENESEQ PLUS, TTR

## 2023-02-15 NOTE — Telephone Encounter (Signed)
Patient's husband called to say he does not think the lab results are back yet based on so he is not sure if she should still come in at 12:45 pm today. Please call patient to advise if they should reschedule the follow up.

## 2023-02-15 NOTE — Telephone Encounter (Signed)
PCP agreed to cancel appt. Called informed the husband.

## 2023-02-18 ENCOUNTER — Other Ambulatory Visit: Payer: Self-pay

## 2023-02-18 ENCOUNTER — Encounter (HOSPITAL_BASED_OUTPATIENT_CLINIC_OR_DEPARTMENT_OTHER): Payer: Self-pay | Admitting: Pediatrics

## 2023-02-18 ENCOUNTER — Emergency Department (HOSPITAL_BASED_OUTPATIENT_CLINIC_OR_DEPARTMENT_OTHER)
Admission: EM | Admit: 2023-02-18 | Discharge: 2023-02-18 | Disposition: A | Discharge status: Home / Self Care | Payer: Medicare PPO | Attending: Emergency Medicine | Admitting: Emergency Medicine

## 2023-02-18 DIAGNOSIS — Z85118 Personal history of other malignant neoplasm of bronchus and lung: Secondary | ICD-10-CM | POA: Insufficient documentation

## 2023-02-18 DIAGNOSIS — S59911A Unspecified injury of right forearm, initial encounter: Secondary | ICD-10-CM | POA: Diagnosis present

## 2023-02-18 DIAGNOSIS — N390 Urinary tract infection, site not specified: Secondary | ICD-10-CM | POA: Diagnosis not present

## 2023-02-18 DIAGNOSIS — N1831 Chronic kidney disease, stage 3a: Secondary | ICD-10-CM | POA: Insufficient documentation

## 2023-02-18 DIAGNOSIS — I5032 Chronic diastolic (congestive) heart failure: Secondary | ICD-10-CM | POA: Insufficient documentation

## 2023-02-18 DIAGNOSIS — E039 Hypothyroidism, unspecified: Secondary | ICD-10-CM | POA: Insufficient documentation

## 2023-02-18 DIAGNOSIS — X58XXXA Exposure to other specified factors, initial encounter: Secondary | ICD-10-CM | POA: Insufficient documentation

## 2023-02-18 DIAGNOSIS — S51811A Laceration without foreign body of right forearm, initial encounter: Secondary | ICD-10-CM | POA: Insufficient documentation

## 2023-02-18 LAB — CBC WITH DIFFERENTIAL/PLATELET
Abs Immature Granulocytes: 0.04 10*3/uL (ref 0.00–0.07)
Basophils Absolute: 0.1 10*3/uL (ref 0.0–0.1)
Basophils Relative: 1 %
Eosinophils Absolute: 0.1 10*3/uL (ref 0.0–0.5)
Eosinophils Relative: 1 %
HCT: 34.5 % — ABNORMAL LOW (ref 36.0–46.0)
Hemoglobin: 11.3 g/dL — ABNORMAL LOW (ref 12.0–15.0)
Immature Granulocytes: 1 %
Lymphocytes Relative: 22 %
Lymphs Abs: 1.9 10*3/uL (ref 0.7–4.0)
MCH: 30.3 pg (ref 26.0–34.0)
MCHC: 32.8 g/dL (ref 30.0–36.0)
MCV: 92.5 fL (ref 80.0–100.0)
Monocytes Absolute: 0.6 10*3/uL (ref 0.1–1.0)
Monocytes Relative: 7 %
Neutro Abs: 5.9 10*3/uL (ref 1.7–7.7)
Neutrophils Relative %: 68 %
Platelets: 147 10*3/uL — ABNORMAL LOW (ref 150–400)
RBC: 3.73 MIL/uL — ABNORMAL LOW (ref 3.87–5.11)
RDW: 13 % (ref 11.5–15.5)
WBC: 8.6 10*3/uL (ref 4.0–10.5)
nRBC: 0 % (ref 0.0–0.2)

## 2023-02-18 LAB — BASIC METABOLIC PANEL
Anion gap: 9 (ref 5–15)
BUN: 32 mg/dL — ABNORMAL HIGH (ref 8–23)
CO2: 23 mmol/L (ref 22–32)
Calcium: 7.8 mg/dL — ABNORMAL LOW (ref 8.9–10.3)
Chloride: 105 mmol/L (ref 98–111)
Creatinine, Ser: 1.41 mg/dL — ABNORMAL HIGH (ref 0.44–1.00)
GFR, Estimated: 39 mL/min — ABNORMAL LOW (ref >60)
Glucose, Bld: 94 mg/dL (ref 70–99)
Potassium: 4 mmol/L (ref 3.5–5.1)
Sodium: 137 mmol/L (ref 135–145)

## 2023-02-18 LAB — URINALYSIS, MICROSCOPIC (REFLEX): WBC, UA: >50 WBC/hpf (ref 0–5)

## 2023-02-18 LAB — URINALYSIS, ROUTINE W REFLEX MICROSCOPIC
Glucose, UA: 100 mg/dL — AB
Ketones, ur: NEGATIVE mg/dL
Nitrite: POSITIVE — AB
Protein, ur: 30 mg/dL — AB
Specific Gravity, Urine: 1.01 (ref 1.005–1.030)
pH: 5 (ref 5.0–8.0)

## 2023-02-18 MED ORDER — METRONIDAZOLE 500 MG PO TABS: 500.0000 mg | ORAL_TABLET | Freq: Two times a day (BID) | ORAL | 0 refills | Status: DC

## 2023-02-18 MED ORDER — METRONIDAZOLE 500 MG PO TABS
500.0000 mg | ORAL_TABLET | Freq: Once | ORAL | Status: AC
  Administered 2023-02-18: 500 mg via ORAL
  Filled 2023-02-18: qty 1

## 2023-02-18 MED ORDER — HEPARIN SOD (PORK) LOCK FLUSH 100 UNIT/ML IV SOLN
INTRAVENOUS | Status: AC
  Administered 2023-02-18: 500 [IU]
  Filled 2023-02-18: qty 5

## 2023-02-18 MED ORDER — MIDODRINE HCL 5 MG PO TABS: 10.0000 mg | ORAL_TABLET | Freq: Three times a day (TID) | ORAL | Status: DC

## 2023-02-18 MED ORDER — SODIUM CHLORIDE 0.9 % IV SOLN: INTRAVENOUS | Status: AC

## 2023-02-18 MED ORDER — CEFDINIR 300 MG PO CAPS: 300.0000 mg | ORAL_CAPSULE | Freq: Two times a day (BID) | ORAL | 0 refills | Status: AC

## 2023-02-18 MED ORDER — SODIUM CHLORIDE 0.9 % IV SOLN
1.0000 g | Freq: Once | INTRAVENOUS | Status: AC
  Administered 2023-02-18: 1 g via INTRAVENOUS
  Filled 2023-02-18: qty 10

## 2023-02-18 MED ORDER — SODIUM CHLORIDE 0.9 % IV SOLN: INTRAVENOUS | Status: DC | PRN

## 2023-02-18 MED ORDER — HEPARIN SOD (PORK) LOCK FLUSH 100 UNIT/ML IV SOLN: 500.0000 [IU] | Freq: Once | INTRAVENOUS | Status: AC

## 2023-02-18 NOTE — ED Triage Notes (Signed)
C/O non healing abrasion on right forearm;   C/o increased urinary frequency and painful urination.

## 2023-02-18 NOTE — ED Notes (Signed)
Mepitel one, gauze roll and medipore tape used to secure wound dressing. Instructed pt and family to leave mepitel one (clear porous film) in place until it heals, but change bandages daily, to keep clean, dry and intact for healing.

## 2023-02-18 NOTE — ED Provider Notes (Signed)
[place] [facility] Provider Note   CSN: [id number] Arrival date & time: 02/18/23  1041     History  Chief Complaint  Patient presents with   Abrasion   Urinary Frequency    Megan Brewer is a 77 y.o. female.  HPI      77 year old female with a history of orthostatic hypotension on midodrine and florinef, hypothyroidism, CKD stage IIIa, lung cancer status post left sided lumpectomy, chronic diastolic CHF on torsemide, immunoglobulin G deficiency, hypogammaglobulinemia, PMR, opioid abuse, history of recurrent orthostatic hypotension with frequent near syncopal episodes over the past 2 years, extensive cardiac workup without a clear cause, hospitalized at [facility] as well as at [facility] in May, most recent ED visit 6/26, who presents with concern for dysuria.  Reports dysuria beginning yesterday, urgency, hesitancy.  Denies abdominal pain, flank pain. Reports since beginning the increased dose of florinef.    Also has questions regarding a skin tear she received while lifting something up-on her right arm, has cleaned it. No redness or swelling.  No fever.   Past Medical History:  Diagnosis Date   Anemia    Arachnoiditis    Bipolar disorder (HCC)    Cataract    removed   Chronic post-thoracotomy pain    CKD (chronic kidney disease)    GERD (gastroesophageal reflux disease)    Hypogammaglobulinemia (HCC)    Hypotension    Hypothyroid    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HCC)    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HCC)    Lung cancer (HCC) 2011, 2014   Memory loss    Migraine    Opioid abuse (HCC)    Osteoporosis    PMR (polymyalgia rheumatica) (HCC) 03/17/2022   Presence of neurostimulator    Restless legs    Sleep apnea      Home Medications Prior to Admission medications   Medication Sig Start Date End Date Taking? Authorizing Provider  cefdinir (OMNICEF) 300 MG capsule Take 1 capsule (300 mg total) by mouth 2 (two) times  daily for 7 days. 02/18/23 02/25/23 Yes Alvira Monday, MD  metroNIDAZOLE (FLAGYL) 500 MG tablet Take 1 tablet (500 mg total) by mouth 2 (two) times daily. 02/18/23  Yes Alvira Monday, MD  acetaminophen (TYLENOL) 500 MG tablet Take 1,000 mg by mouth as needed for moderate pain.    [provider]  azelastine (ASTELIN) 0.1 % nasal spray Place 2 sprays into both nostrils 2 (two) times daily. Patient taking differently: Place 2 sprays into both nostrils as needed for rhinitis or allergies. 03/10/21   Hetty Blend, FNP  baclofen (LIORESAL) 10 MG tablet Take 10 mg by mouth daily as needed for muscle spasms. 06/03/20   [provider]  CALCIUM PO Take 1 tablet by mouth in the morning and at bedtime.    [provider]  cholecalciferol (VITAMIN D3) 25 MCG (1000 UNIT) tablet Take 1,000 Units by mouth daily.    [provider]  cycloSPORINE (RESTASIS) 0.05 % ophthalmic emulsion Place 1 drop into both eyes in the morning, at noon, in the evening, and at bedtime.    [provider]  denosumab (PROLIA) 60 MG/ML SOSY injection Pt to get at office.  Appt is 02/10/23 01/19/23   Sharlene Dory, DO  dexlansoprazole (DEXILANT) 60 MG capsule Take 1 capsule (60 mg total) by mouth daily with breakfast. 04/30/22   Esterwood, Amy S, PA-C  famotidine (PEPCID) 20 MG tablet Take 1 tablet (20 mg total) by  mouth at bedtime. 04/30/22   Esterwood, Amy S, PA-C  ferrous sulfate 325 (65 FE) MG tablet Take 650 mg by mouth daily with breakfast.    [provider]  fludrocortisone (FLORINEF) 0.1 MG tablet Take 2 tablets (0.2 mg total) by mouth daily for 30 doses. 02/10/23 03/12/23  Curatolo, Adam, DO  GAMMAGARD 20 GM/200ML SOLN Inject 40 g into the vein every 21 ( twenty-one) days. INFUSE 40G INTRAVENOUSLY EVERY 3 WEEKS 02/27/22   [provider]  heparin lock flush 100 UNIT/ML SOLN injection Inject 500 Units into the vein every 21 ( twenty-one) days. 03/31/21   [provider]  ibuprofen (ADVIL) 200 MG tablet Take 600 mg by mouth as needed for mild pain.    [provider]  KEVZARA 200 MG/1. SOAJ Inject 1.14 mLs into the skin every 14 (fourteen) days. 09/09/22   [provider]  levothyroxine (SYNTHROID) 75 MCG tablet TAKE ONE (1) TABLET BY MOUTH EACH DAY BEFORE BREAKFAST ON EMPTY STOMACH Patient taking differently: Take 75 mcg by mouth daily before breakfast. 30 MINUTES BEFORE BREAKFAST ON EMPTY STOMACH. 09/11/22   Sharlene Dory, DO  magnesium oxide (MAG-OX) 400 MG tablet Take 400 mg by mouth daily.     [provider]  midodrine (PROAMATINE) 10 MG tablet Take 1 tablet (10 mg total) by mouth in the morning and at bedtime. 01/25/23   Sharlene Dory, DO  mupirocin ointment (BACTROBAN) 2 % Apply 1 application. topically 2 (two) times daily. 12/29/21   Sharon Seller, NP  MYRBETRIQ 50 MG TB24 tablet Take 50 mg by mouth daily. 10/07/22   [provider]  ondansetron (ZOFRAN) 8 MG tablet TAKE ONE TABLET BY MOUTH EVERY EIGHT HOURS AS NEEDED FOR NAUSEA OR VOMITING 02/15/23   Wendling, Jilda Roche, DO  potassium chloride (KLOR-CON) 10 MEQ tablet Take 2 tablets (20 mEq total) by mouth 2 (two) times daily. 12/29/22   Sharlene Dory, DO  potassium chloride SA (KLOR-CON M) 20 MEQ tablet Take 20 mEq by mouth 6 (six) times daily.    [provider]  pyridoxine (B-6) 100 MG tablet Take 100 mg by mouth daily.    Sharlene Dory, DO  rOPINIRole (REQUIP) 0.5 MG tablet Take 0.5 mg by mouth 3 (three) times daily.    Sharlene Dory, DO  sucralfate (CARAFATE) 1 GM/10ML suspension TAKE 10 MLS (1 G TOTAL) BY MOUTH 4 TIMESDAILY AS NEEDED 01/25/23   Sharlene Dory, DO  tamsulosin (FLOMAX) 0.4 MG CAPS capsule Take 1 capsule (0.4 mg total) by mouth daily. 01/23/21   Burnadette Pop, MD  torsemide (DEMADEX) 20 MG tablet Take 1 tablet (20 mg total) by mouth daily as needed (fluid,  edema). 12/24/22   Leatha Gilding, MD  triamcinolone cream (KENALOG) 0.1 % Apply 1 application  topically 2 (two) times daily as needed (rash).    [provider]  vitamin B-12 (CYANOCOBALAMIN) 1000 MCG tablet Take 1,000 mcg by mouth daily. 02/14/21   [provider]  Vitamin D, Ergocalciferol, (DRISDOL) 1.25 MG (50000 UNIT) CAPS capsule TAKE 1 CAPSULE ONCE WEEKLY 01/04/23   [name], Drema Pry, MD  ZINC OXIDE, TOPICAL, 10 % CREA Apply 1 application topically 2 (two) times daily as needed (rash). 02/14/21   Glade Lloyd, MD      Allergies    Butorphanol, Erythromycin base, Pregabalin, Pregabalin er, Propoxyphene, Sumatriptan, Tetracycline, Ciprofloxacin, Corticosteroids, Doxycycline, Cefixime, Gabapentin, Isometheptene-dichloral-apap, Ketorolac, Prednisone, Promethazine, and Tape    Review of  Systems   Review of Systems  Physical Exam Updated Vital Signs BP 104/80   Pulse 70   Temp 98 F (36.7 C) (Oral)   Resp 18   Ht 5\' 3"  (1.6 m)   Wt 63.5 kg   SpO2 100%   BMI 24.80 kg/m  Physical Exam Vitals and nursing note reviewed.  Constitutional:      General: She is not in acute distress.    Appearance: She is well-developed. She is not diaphoretic.  HENT:     Head: Normocephalic and atraumatic.  Eyes:     Conjunctiva/sclera: Conjunctivae normal.  Cardiovascular:     Rate and Rhythm: Normal rate and regular rhythm.     Heart sounds: Normal heart sounds. No murmur heard.    No friction rub. No gallop.  Pulmonary:     Effort: Pulmonary effort is normal. No respiratory distress.     Breath sounds: Normal breath sounds. No wheezing or rales.  Abdominal:     General: There is no distension.     Palpations: Abdomen is soft.     Tenderness: There is no abdominal tenderness. There is no guarding.  Musculoskeletal:        General: No tenderness.     Cervical back: Normal range of motion.  Skin:    General: Skin is warm and dry.     Findings: No erythema or rash.      Comments: Skin tear right forearm  Neurological:     Mental Status: She is alert and oriented to person, place, and time.     ED Results / Procedures / Treatments   Labs (all labs ordered are listed, but only abnormal results are displayed) Labs Reviewed  URINALYSIS, ROUTINE W REFLEX MICROSCOPIC - Abnormal; Notable for the following components:      Result Value   Color, Urine ORANGE (*)    APPearance CLOUDY (*)    Glucose, UA 100 (*)    Hgb urine dipstick MODERATE (*)    Bilirubin Urine SMALL (*)    Protein, ur 30 (*)    Nitrite POSITIVE (*)    Leukocytes,Ua LARGE (*)    All other components within normal limits  URINALYSIS, MICROSCOPIC (REFLEX) - Abnormal; Notable for the following components:   Bacteria, UA MANY (*)    All other components within normal limits  CBC WITH DIFFERENTIAL/PLATELET - Abnormal; Notable for the following components:   RBC 3.73 (*)    Hemoglobin 11.3 (*)    HCT 34.5 (*)    Platelets 147 (*)    All other components within normal limits  BASIC METABOLIC PANEL - Abnormal; Notable for the following components:   BUN 32 (*)    Creatinine, Ser 1.41 (*)    Calcium 7.8 (*)    GFR, Estimated 39 (*)    All other components within normal limits  URINE CULTURE    EKG None  Radiology No results found.  Procedures Procedures    Medications Ordered in ED Medications  0.9 %  sodium chloride infusion (0 mLs Intravenous Stopped 02/18/23 1532)  cefTRIAXone (ROCEPHIN) 1 g in sodium chloride 0.9 % 100 mL IVPB (0 g Intravenous Stopped 02/18/23 1415)  metroNIDAZOLE (FLAGYL) tablet 500 mg (500 mg Oral Given 02/18/23 1320)  heparin lock flush 100 unit/mL (500 Units Intracatheter Given 02/18/23 1545)    ED Course/ Medical Decision Making/ A&P  77 year old female with a history of orthostatic hypotension on midodrine and florinef, hypothyroidism, CKD stage IIIa, lung cancer status post left sided lumpectomy, chronic diastolic CHF on  torsemide, immunoglobulin G deficiency, hypogammaglobulinemia, PMR, opioid abuse, history of recurrent orthostatic hypotension with frequent near syncopal episodes over the past 2 years, extensive cardiac workup without a clear cause, hospitalized at [facility] as well as at [facility] in May, most recent ED visit 6/26, who presents with concern for dysuria.  Also notes skin tear-area without signs of infection, recommend continued supportive care, nonadherent dressing.   UA is consistent with UTI. Culture ordered.  Given comorbidities in setting of infectious symptoms, obtained cbc/cmp to evaluate for complications  Labs completed and personally evaluated and interpreted by me show no leukocytosis, similar hgb to prior, Cr decreased slightly from prior, no clinically significant electrolyte abnormalities.  Given dose of IV rocephin and 500cc.  BP largely 90s systolic which she reports is baseline, she is asymptomatic (with exception of urinary symptoms) without signs of sepsis or acidosis.  Discussed her varying BP in detail.  Feel she is stable to return home, take her home medications and return to the ED if symptoms worsened.  Given rx for cefdinir and flagyl (per pt request, cdiff prevention). Patient discharged in stable condition with understanding of reasons to return.           Final Clinical Impression(s) / ED Diagnoses Final diagnoses:  Lower urinary tract infectious disease    Rx / DC Orders ED Discharge Orders          Ordered    cefdinir (OMNICEF) 300 MG capsule  2 times daily        02/18/23 1603    metroNIDAZOLE (FLAGYL) 500 MG tablet  2 times daily        02/18/23 1617              Alvira Monday, MD 02/18/23 2307

## 2023-02-18 NOTE — ED Notes (Signed)
Up to BR in w/c. Husband at bedside

## 2023-02-19 LAB — URINE CULTURE

## 2023-02-20 LAB — URINE CULTURE: Culture: >=100000 — AB

## 2023-02-21 ENCOUNTER — Other Ambulatory Visit: Payer: Self-pay

## 2023-02-21 ENCOUNTER — Telehealth (HOSPITAL_BASED_OUTPATIENT_CLINIC_OR_DEPARTMENT_OTHER): Payer: Self-pay | Admitting: *Deleted

## 2023-02-21 ENCOUNTER — Emergency Department (HOSPITAL_COMMUNITY)
Admission: EM | Admit: 2023-02-21 | Discharge: 2023-02-21 | Disposition: A | Discharge status: Home / Self Care | Payer: Medicare PPO | Attending: Emergency Medicine | Admitting: Emergency Medicine

## 2023-02-21 ENCOUNTER — Encounter (HOSPITAL_COMMUNITY): Payer: Self-pay

## 2023-02-21 DIAGNOSIS — I509 Heart failure, unspecified: Secondary | ICD-10-CM | POA: Insufficient documentation

## 2023-02-21 DIAGNOSIS — I951 Orthostatic hypotension: Secondary | ICD-10-CM

## 2023-02-21 DIAGNOSIS — E876 Hypokalemia: Secondary | ICD-10-CM | POA: Diagnosis not present

## 2023-02-21 DIAGNOSIS — R531 Weakness: Secondary | ICD-10-CM | POA: Diagnosis present

## 2023-02-21 DIAGNOSIS — I959 Hypotension, unspecified: Secondary | ICD-10-CM | POA: Diagnosis not present

## 2023-02-21 DIAGNOSIS — I10 Essential (primary) hypertension: Secondary | ICD-10-CM | POA: Diagnosis not present

## 2023-02-21 DIAGNOSIS — R230 Cyanosis: Secondary | ICD-10-CM | POA: Diagnosis not present

## 2023-02-21 LAB — COMPREHENSIVE METABOLIC PANEL
ALT: 31 U/L (ref 0–44)
AST: 36 U/L (ref 15–41)
Albumin: 2.8 g/dL — ABNORMAL LOW (ref 3.5–5.0)
Alkaline Phosphatase: 49 U/L (ref 38–126)
Anion gap: 7 (ref 5–15)
BUN: 21 mg/dL (ref 8–23)
CO2: 26 mmol/L (ref 22–32)
Calcium: 7.9 mg/dL — ABNORMAL LOW (ref 8.9–10.3)
Chloride: 107 mmol/L (ref 98–111)
Creatinine, Ser: 1.34 mg/dL — ABNORMAL HIGH (ref 0.44–1.00)
GFR, Estimated: 42 mL/min — ABNORMAL LOW (ref >60)
Glucose, Bld: 103 mg/dL — ABNORMAL HIGH (ref 70–99)
Potassium: 3.1 mmol/L — ABNORMAL LOW (ref 3.5–5.1)
Sodium: 140 mmol/L (ref 135–145)
Total Bilirubin: 0.3 mg/dL (ref 0.3–1.2)
Total Protein: 5.6 g/dL — ABNORMAL LOW (ref 6.5–8.1)

## 2023-02-21 LAB — CBC WITH DIFFERENTIAL/PLATELET
Abs Immature Granulocytes: 0 10*3/uL (ref 0.00–0.07)
Basophils Absolute: 0.1 10*3/uL (ref 0.0–0.1)
Basophils Relative: 1 %
Eosinophils Absolute: 0.1 10*3/uL (ref 0.0–0.5)
Eosinophils Relative: 2 %
HCT: 36.2 % (ref 36.0–46.0)
Hemoglobin: 11.8 g/dL — ABNORMAL LOW (ref 12.0–15.0)
Immature Granulocytes: 0 %
Lymphocytes Relative: 35 %
Lymphs Abs: 1.3 10*3/uL (ref 0.7–4.0)
MCH: 30.3 pg (ref 26.0–34.0)
MCHC: 32.6 g/dL (ref 30.0–36.0)
MCV: 93.1 fL (ref 80.0–100.0)
Monocytes Absolute: 0.4 10*3/uL (ref 0.1–1.0)
Monocytes Relative: 12 %
Neutro Abs: 1.8 10*3/uL (ref 1.7–7.7)
Neutrophils Relative %: 50 %
Platelets: 158 10*3/uL (ref 150–400)
RBC: 3.89 MIL/uL (ref 3.87–5.11)
RDW: 12.9 % (ref 11.5–15.5)
WBC: 3.6 10*3/uL — ABNORMAL LOW (ref 4.0–10.5)
nRBC: 0 % (ref 0.0–0.2)

## 2023-02-21 MED ORDER — POTASSIUM CHLORIDE CRYS ER 20 MEQ PO TBCR
40.0000 meq | EXTENDED_RELEASE_TABLET | Freq: Once | ORAL | Status: AC
  Administered 2023-02-21: 40 meq via ORAL
  Filled 2023-02-21: qty 2

## 2023-02-21 MED ORDER — LACTATED RINGERS IV BOLUS
1000.0000 mL | Freq: Once | INTRAVENOUS | Status: AC
  Administered 2023-02-21: 1000 mL via INTRAVENOUS

## 2023-02-21 MED ORDER — HEPARIN SOD (PORK) LOCK FLUSH 100 UNIT/ML IV SOLN
INTRAVENOUS | Status: AC
  Filled 2023-02-21: qty 5

## 2023-02-21 NOTE — Telephone Encounter (Signed)
Post ED Visit - Positive Culture Follow-up  Culture report reviewed by antimicrobial stewardship pharmacist: [facility] []  Enzo Bi, Pharm.D. []  Celedonio Miyamoto, Pharm.D., BCPS AQ-ID []  Garvin Fila, Pharm.D., BCPS []  Georgina Pillion, Pharm.D., BCPS []  [place], [address].D., BCPS, AAHIVP []  Estella Husk, Pharm.D., BCPS, AAHIVP []  Lysle Pearl, PharmD, BCPS []  Phillips Climes, PharmD, BCPS []  Agapito Games, PharmD, BCPS []  Verlan Friends, PharmD []  Mervyn Gay, PharmD, BCPS [x]  Delmar Landau, PharmD  [facility] []  Len Childs, PharmD []  Greer Pickerel, PharmD []  Adalberto Cole, PharmD []  Perlie Gold, Rph []  Lonell Face) Jean Rosenthal, PharmD []  Earl Many, PharmD []  Junita Push, PharmD []  Dorna Leitz, PharmD []  Terrilee Files, PharmD []  Lynann Beaver, PharmD []  Keturah Barre, PharmD []  Loralee Pacas, PharmD []  Bernadene Person, PharmD   Positive urine culture Treated with Cefdinir, organism sensitive to the same and no further patient follow-up is required at this time.  Megan Brewer 02/21/2023, 12:52 PM

## 2023-02-21 NOTE — ED Provider Notes (Signed)
[place] EMERGENCY DEPARTMENT AT [facility] Provider Note   CSN: [id number] Arrival date & time: 02/21/23  1704     History  Chief Complaint  Patient presents with   Hypotension    Megan Brewer is a 77 y.o. female.  Patient is a 77 year old female with a past medical history of autonomic dysfunction on midodrine and fludrocortisone, CHF, prior lung cancer s/p lobectomy, IgG deficiency, chronic pain presenting to the emergency department with weakness and hypotension.  Patient states that she was in the ED 2 days ago and was treated for a UTI.  She states that since starting the antibiotics she has felt more weak.  She states that they measured her blood pressure today and it was in the 60s systolic so they called 911.  She states that her urinary symptoms have improved with the antibiotics.  She denies any fevers, abdominal pain or vomiting.  States she has felt mildly nauseous and chilled.  She states that she has been having diarrhea for about the last week at least 3 episodes per day.  She denies any black or bloody stools.  She states she has been taking her medications as prescribed.  The history is provided by the patient and the spouse.       Home Medications Prior to Admission medications   Medication Sig Start Date End Date Taking? Authorizing Provider  acetaminophen (TYLENOL) 500 MG tablet Take 1,000 mg by mouth as needed for moderate pain.    [provider]  azelastine (ASTELIN) 0.1 % nasal spray Place 2 sprays into both nostrils 2 (two) times daily. Patient taking differently: Place 2 sprays into both nostrils as needed for rhinitis or allergies. 03/10/21   Hetty Blend, FNP  baclofen (LIORESAL) 10 MG tablet Take 10 mg by mouth daily as needed for muscle spasms. 06/03/20   [provider]  CALCIUM PO Take 1 tablet by mouth in the morning and at bedtime.    [provider]  cefdinir (OMNICEF) 300 MG capsule Take 1 capsule (300  mg total) by mouth 2 (two) times daily for 7 days. 02/18/23 02/25/23  Alvira Monday, MD  cholecalciferol (VITAMIN D3) 25 MCG (1000 UNIT) tablet Take 1,000 Units by mouth daily.    [provider]  cycloSPORINE (RESTASIS) 0.05 % ophthalmic emulsion Place 1 drop into both eyes in the morning, at noon, in the evening, and at bedtime.    [provider]  denosumab (PROLIA) 60 MG/ML SOSY injection Pt to get at office.  Appt is 02/10/23 01/19/23   Sharlene Dory, DO  dexlansoprazole (DEXILANT) 60 MG capsule Take 1 capsule (60 mg total) by mouth daily with breakfast. 04/30/22   Esterwood, Amy S, PA-C  famotidine (PEPCID) 20 MG tablet Take 1 tablet (20 mg total) by mouth at bedtime. 04/30/22   Esterwood, Amy S, PA-C  ferrous sulfate 325 (65 FE) MG tablet Take 650 mg by mouth daily with breakfast.    [provider]  fludrocortisone (FLORINEF) 0.1 MG tablet Take 2 tablets (0.2 mg total) by mouth daily for 30 doses. 02/10/23 03/12/23  Curatolo, Adam, DO  GAMMAGARD 20 GM/200ML SOLN Inject 40 g into the vein every 21 ( twenty-one) days. INFUSE 40G INTRAVENOUSLY EVERY 3 WEEKS 02/27/22   [provider]  heparin lock flush 100 UNIT/ML SOLN injection Inject 500 Units into the vein every 21 ( twenty-one) days. 03/31/21   [provider]  ibuprofen (ADVIL) 200 MG tablet Take 600 mg  by mouth as needed for mild pain.    [provider]  KEVZARA 200 MG/1. SOAJ Inject 1.14 mLs into the skin every 14 (fourteen) days. 09/09/22   [provider]  levothyroxine (SYNTHROID) 75 MCG tablet TAKE ONE (1) TABLET BY MOUTH EACH DAY BEFORE BREAKFAST ON EMPTY STOMACH Patient taking differently: Take 75 mcg by mouth daily before breakfast. 30 MINUTES BEFORE BREAKFAST ON EMPTY STOMACH. 09/11/22   Sharlene Dory, DO  magnesium oxide (MAG-OX) 400 MG tablet Take 400 mg by mouth daily.     [provider]  metroNIDAZOLE (FLAGYL) 500 MG tablet Take 1  tablet (500 mg total) by mouth 2 (two) times daily. 02/18/23   Alvira Monday, MD  midodrine (PROAMATINE) 10 MG tablet Take 1 tablet (10 mg total) by mouth in the morning and at bedtime. 01/25/23   Sharlene Dory, DO  mupirocin ointment (BACTROBAN) 2 % Apply 1 application. topically 2 (two) times daily. 12/29/21   Sharon Seller, NP  MYRBETRIQ 50 MG TB24 tablet Take 50 mg by mouth daily. 10/07/22   [provider]  ondansetron (ZOFRAN) 8 MG tablet TAKE ONE TABLET BY MOUTH EVERY EIGHT HOURS AS NEEDED FOR NAUSEA OR VOMITING 02/15/23   Wendling, Jilda Roche, DO  potassium chloride (KLOR-CON) 10 MEQ tablet Take 2 tablets (20 mEq total) by mouth 2 (two) times daily. 12/29/22   Sharlene Dory, DO  potassium chloride SA (KLOR-CON M) 20 MEQ tablet Take 20 mEq by mouth 6 (six) times daily.    [provider]  pyridoxine (B-6) 100 MG tablet Take 100 mg by mouth daily.    Sharlene Dory, DO  rOPINIRole (REQUIP) 0.5 MG tablet Take 0.5 mg by mouth 3 (three) times daily.    Sharlene Dory, DO  sucralfate (CARAFATE) 1 GM/10ML suspension TAKE 10 MLS (1 G TOTAL) BY MOUTH 4 TIMESDAILY AS NEEDED 01/25/23   Sharlene Dory, DO  tamsulosin (FLOMAX) 0.4 MG CAPS capsule Take 1 capsule (0.4 mg total) by mouth daily. 01/23/21   Burnadette Pop, MD  torsemide (DEMADEX) 20 MG tablet Take 1 tablet (20 mg total) by mouth daily as needed (fluid, edema). 12/24/22   Leatha Gilding, MD  triamcinolone cream (KENALOG) 0.1 % Apply 1 application  topically 2 (two) times daily as needed (rash).    [provider]  vitamin B-12 (CYANOCOBALAMIN) 1000 MCG tablet Take 1,000 mcg by mouth daily. 02/14/21   [provider]  Vitamin D, Ergocalciferol, (DRISDOL) 1.25 MG (50000 UNIT) CAPS capsule TAKE 1 CAPSULE ONCE WEEKLY 01/04/23   [name], Drema Pry, MD  ZINC OXIDE, TOPICAL, 10 % CREA Apply 1 application topically 2 (two) times daily as needed (rash). 02/14/21   Glade Lloyd, MD      Allergies    Butorphanol, Erythromycin base, Pregabalin, Pregabalin er, Propoxyphene, Sumatriptan, Tetracycline, Ciprofloxacin, Corticosteroids, Doxycycline, Cefixime, Gabapentin, Isometheptene-dichloral-apap, Ketorolac, Prednisone, Promethazine, and Tape    Review of Systems   Review of Systems  Physical Exam Updated Vital Signs BP (!) 108/50   Pulse 74   Temp 98.1 F (36.7 C) (Oral)   Resp 15   Ht 5\' 3"  (1.6 m)   Wt 68 kg   SpO2 100%   BMI 26.57 kg/m  Physical Exam Vitals and nursing note reviewed.  Constitutional:      General: She is not in acute distress.    Appearance: Normal appearance. She is ill-appearing.  HENT:     Head: Normocephalic and atraumatic.  Nose: Nose normal.     Mouth/Throat:     Mouth: Mucous membranes are moist.     Pharynx: Oropharynx is clear.  Eyes:     Extraocular Movements: Extraocular movements intact.     Conjunctiva/sclera: Conjunctivae normal.  Cardiovascular:     Rate and Rhythm: Normal rate and regular rhythm.     Heart sounds: Normal heart sounds.  Pulmonary:     Effort: Pulmonary effort is normal.     Breath sounds: Normal breath sounds.  Abdominal:     General: Abdomen is flat.     Palpations: Abdomen is soft.     Tenderness: There is no abdominal tenderness.  Musculoskeletal:        General: Normal range of motion.     Cervical back: Normal range of motion.  Skin:    General: Skin is warm and dry.  Neurological:     General: No focal deficit present.     Mental Status: She is alert and oriented to person, place, and time.     Sensory: No sensory deficit.     Motor: No weakness.  Psychiatric:        Mood and Affect: Mood normal.        Behavior: Behavior normal.     ED Results / Procedures / Treatments   Labs (all labs ordered are listed, but only abnormal results are displayed) Labs Reviewed  COMPREHENSIVE METABOLIC PANEL - Abnormal; Notable for the following components:      Result Value    Potassium 3.1 (*)    Glucose, Bld 103 (*)    Creatinine, Ser 1.34 (*)    Calcium 7.9 (*)    Total Protein 5.6 (*)    Albumin 2.8 (*)    GFR, Estimated 42 (*)    All other components within normal limits  CBC WITH DIFFERENTIAL/PLATELET - Abnormal; Notable for the following components:   WBC 3.6 (*)    Hemoglobin 11.8 (*)    All other components within normal limits  URINALYSIS, ROUTINE W REFLEX MICROSCOPIC    EKG EKG Interpretation Date/Time:  Sunday February 21 2023 19:13:58 EDT Ventricular Rate:  119 PR Interval:  149 QRS Duration:  88 QT Interval:  279 QTC Calculation: 393 R Axis:   81  Text Interpretation: Uncertain rhythm: review Anteroseptal infarct, age indeterminate Lead(s) I V4 V5 were not used for morphology analysis Duplicate EKG Confirmed by Elayne Snare ([id number]) on 02/21/2023 7:36:48 PM  Radiology No results found.  Procedures Procedures    Medications Ordered in ED Medications  potassium chloride SA (KLOR-CON M) CR tablet 40 mEq (has no administration in time range)  lactated ringers bolus 1,000 mL (1,000 mLs Intravenous New Bag/Given 02/21/23 1902)    ED Course/ Medical Decision Making/ A&P Clinical Course as of 02/21/23 2031  Megan Brewer Feb 21, 2023  1932 Mild hypokalemia, Cr at baseline. [VK]  2005 Orthostatics positive going from laying to sitting, improved upon standing [VK]  2027 Patient reported significant improvement of her symptoms as she was asymptomatic during orthostatic vitals.  We discussed that her symptoms are less likely related to her antibiotic and more likely related to her having an infection and diarrhea and she is agreeable to continue on the current antibiotic that she is on for her UTI.  She was recommended to take Imodium as needed for her diarrhea and to follow-up with her cardiologist to have her symptoms rechecked.  She is stable for discharge home and was given strict return precautions. [VK]  Clinical Course User Index [VK] Rexford Maus, DO                             Medical Decision Making This patient presents to the ED with chief complaint(s) of hypotension, weakness with pertinent past medical history of orthostatic hypotension, CHF, IGG deficiency, chronic pain which further complicates the presenting complaint. The complaint involves an extensive differential diagnosis and also carries with it a high risk of complications and morbidity.    The differential diagnosis includes orthostatic hypotension, dehydration, electrolyte abnormality, considering sepsis in setting of her recent UTI, arrhythmia, anemia  Additional history obtained: Additional history obtained from spouse Records reviewed outpatient cardiology records, prior ED records  ED Course and Reassessment: On patient's arrival to the emergency department her blood pressures are soft in the 90s to 100s systolic and she is otherwise hemodynamically stable in no acute distress without focal neurologic deficits.  Patient will be given a liter of IV fluids and will have labs performed to evaluate for severe dehydration, worsening UTI or electrolyte abnormality or anemia as cause of her symptoms and she will be closely reassessed.  Independent labs interpretation:  The following labs were independently interpreted: hypokalemia, otherwise within normal range/at baseline  Independent visualization of imaging: - N/A  Consultation: - Consulted or discussed management/test interpretation w/ external professional: N/A  Consideration for admission or further workup: Patient has no emergent conditions requiring admission or further work-up at this time and is stable for discharge home with primary care follow-up  Social Determinants of health: N/A    Amount and/or Complexity of Data Reviewed Labs: ordered.  Risk Prescription drug management.          Final Clinical Impression(s) / ED Diagnoses Final diagnoses:  Orthostatic hypotension   Hypokalemia    Rx / DC Orders ED Discharge Orders     None         Rexford Maus, DO 02/21/23 2031

## 2023-02-21 NOTE — Discharge Instructions (Signed)
You were seen in the emergency department for your low blood pressure.  This is likely related to your known orthostatic hypotension and probably was worse today with your recent infection and the diarrhea that you have been having.  Your potassium level is slightly low but he had no severe dehydration and your blood pressure improved after you received fluids.  We did give you potassium repletion here and you may take Imodium as needed for your diarrhea.  You should make sure that you are drinking plenty of fluids and staying well-hydrated.  You should follow-up with your primary doctor and your cardiologist to have your symptoms rechecked and to determine if you need any medication changes.  You should return to the emergency department if you have low blood pressure and you pass out, you have significant dizziness, you have low blood pressure that does not go back up on its own or if you have any other new or concerning symptoms.

## 2023-02-22 ENCOUNTER — Encounter: Payer: Medicare PPO | Attending: Physical Medicine and Rehabilitation | Admitting: Physical Medicine and Rehabilitation

## 2023-02-22 ENCOUNTER — Encounter: Payer: Self-pay | Admitting: Physical Medicine and Rehabilitation

## 2023-02-22 VITALS — BP 85/52 | HR 77 | Ht 63.0 in

## 2023-02-22 DIAGNOSIS — D509 Iron deficiency anemia, unspecified: Secondary | ICD-10-CM | POA: Diagnosis not present

## 2023-02-22 DIAGNOSIS — N189 Chronic kidney disease, unspecified: Secondary | ICD-10-CM | POA: Insufficient documentation

## 2023-02-22 DIAGNOSIS — I959 Hypotension, unspecified: Secondary | ICD-10-CM | POA: Diagnosis not present

## 2023-02-22 DIAGNOSIS — M353 Polymyalgia rheumatica: Secondary | ICD-10-CM | POA: Insufficient documentation

## 2023-02-22 NOTE — Progress Notes (Signed)
Subjective:    Patient ID: Megan Brewer, female    DOB: 1945-09-18, 77 y.o.   MRN: [id number]  HPI 1) Diffuse pain, has been attributed to polymyalgia rheumatica Mrs. Dockstader is a 77 year old woman who presents to establish care for diffuse pain.  -it is present in her wrists, clavicles, left hip -the prednisone helps to control the pain but it only lasts 6 weeks and by the times she could go for the second infusion it was bad.  -she will be seeing her rheumatologist in a few weeks and would be very grateful if he were able to increased the frequency to q6 weeks -has been pretty good  -The Iv prednisone helped a great deal for some time.  -she has f/u with rheumatology -they have tried high doses of prednisone, low does hydrocodone, low doses Tramadol, lidocaine patches. Nothing has been helpful yet -she thinks the pain started after she spent 1 month at [facility] for sepsis and C diff. She left the hospital on June 1st. On June 4th she started with breathing difficulties, shaking, and some pain. It gradually got worse.  -her husband accompanies her today -steroids did not help leg pain -interested in cervical traction device  2) Shortness of breath -breathing without pain for the first time! -had been present July 2022 to July 2023 -she also feels a deflated rubber tire on her chest -she has been told her heart and lungs are fine -the steroids may have helped her to breath better -at the hospital where she was diagnosed she got a massive IV dose of steroids and this cleared up her lungs  3) Muscle spasms: -she has tried flexeril in the past with benefit -she recently tried robaxin and this caused severe symptomatic hypotension requiring her to go to the ED  4) Hypotension: -fluids help -occurs suddenly -she is taking the midodrine and florinef -she takes midodrine at 7am/7pm  5) Fluid retention: -she is on the flomax for this     Pain Inventory Average Pain   5 Pain Right Now 8 My pain is intermittent, constant, sharp, burning, stabbing, and aching  In the last 24 hours, has pain interfered with the following? General activity 10 Relation with others 10 Enjoyment of life 10 What TIME of day is your pain at its worst? morning , daytime, evening, and night Sleep (in general) Poor  Pain is worse with: unsure Pain improves with: rest Relief from Meds: little    New pt    Family History  Problem Relation Age of Onset   Hypertension Mother    GER disease Mother    Dementia Mother    Osteoporosis Mother    Arthritis Mother    Bipolar disorder Mother    Parkinson's disease Father    Congestive Heart Failure Father    Pneumonia Father    Arthritis Father    Dementia Father    Depression Father    Asthma Sister    Depression Sister    Bipolar disorder Brother    Asthma Daughter    Colon cancer Neg Hx    Esophageal cancer Neg Hx    Stomach cancer Neg Hx    Rectal cancer Neg Hx    Social History   Socioeconomic History   Marital status: Married    Spouse name: Not on file   Number of children: 2   Years of education: Not on file   Highest education level: GED or equivalent  Occupational History  Occupation: retired  Tobacco Use   Smoking status: Never   Smokeless tobacco: Never  Vaping Use   Vaping Use: Never used  Substance and Sexual Activity   Alcohol use: Not Currently   Drug use: Never   Sexual activity: Not on file  Other Topics Concern   Not on file  Social History Narrative   Diet: None      Caffeine: coffee, tea, sodas less than or 1 daily.      Married, if yes what year: Yes, 1972      Do you live in a house, apartment, assisted living, condo, trailer, ect: Two story house       Pets: None      Current/Past profession: Teach      Exercise: None         Living Will: No   DNR: No   POA/HPOA: No      Functional Status:   Do you have difficulty bathing or dressing yourself? yes   Do you  have difficulty preparing food or eating? yes   Do you have difficulty managing your medications? yes   Do you have difficulty managing your finances?yes   Do you have difficulty affording your medications? No      Right Handed    Lives in a two story home    Social Determinants of Health   Financial Resource Strain: Not on file  Food Insecurity: No Food Insecurity (12/25/2022)   Hunger Vital Sign    Worried About Running Out of Food in the Last Year: Never true    Ran Out of Food in the Last Year: Never true  Transportation Needs: No Transportation Needs (12/25/2022)   PRAPARE - Administrator, Civil Service (Medical): No    Lack of Transportation (Non-Medical): No  Physical Activity: Not on file  Stress: Not on file  Social Connections: Not on file   Past Surgical History:  Procedure Laterality Date   ABDOMINAL HYSTERECTOMY     CARPAL TUNNEL RELEASE     CATARACT EXTRACTION Bilateral 2019   CHOLECYSTECTOMY     COLONOSCOPY  2015   [facility]   CT LUNG SCREENING  2018   DENTAL SURGERY  06/17/2021   4 teeth rmoved   DG  BONE DENSITY ([facility] HX)  2018   DIAGNOSTIC MAMMOGRAM  2019   GASTRIC BYPASS     LUNG CANCER SURGERY  02/2010   MULTIPLE TOOTH EXTRACTIONS     PORT A CATH REVISION Right 04/2021   PORT-A-CATH REMOVAL Left 02/07/2021   Procedure: REMOVAL PORT-A-CATH;  Surgeon: Rodman Pickle, MD;  Location: [facility];  Service: General;  Laterality: Left;  60 MINUTES ROOM 4   SPINAL CORD STIMULATOR IMPLANT     Past Medical History:  Diagnosis Date   Anemia    Arachnoiditis    Bipolar disorder (HCC)    Cataract    removed   Chronic post-thoracotomy pain    CKD (chronic kidney disease)    GERD (gastroesophageal reflux disease)    Hypogammaglobulinemia (HCC)    Hypotension    Hypothyroid    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HCC)    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HCC)    Lung cancer (HCC) 2011, 2014   Memory loss    Migraine    Opioid abuse (HCC)     Osteoporosis    PMR (polymyalgia rheumatica) (HCC) 03/17/2022   Presence of neurostimulator    Restless legs    Sleep apnea    There were  no vitals taken for this visit.  Opioid Risk Score:   Fall Risk Score:  `1  Depression screen [facility] 2/9     10/27/2022   11:10 AM 06/11/2022   10:09 AM 09/30/2021   11:02 AM 02/04/2021    3:28 PM 09/27/2020   10:57 AM 06/10/2020    2:51 PM 09/25/2019    1:06 PM  Depression screen PHQ 2/9  Decreased Interest 1 3 0 0 0 0 0  Down, Depressed, Hopeless 1 3 0 0 0 0 0  PHQ - 2 Score 2 6 0 0 0 0 0  Altered sleeping  3       Tired, decreased energy  3       Change in appetite  2       Feeling bad or failure about yourself   3       Trouble concentrating  0       Moving slowly or fidgety/restless  3       Suicidal thoughts  3       PHQ-9 Score  23       Difficult doing work/chores  Extremely dIfficult           Review of Systems  Respiratory:  Positive for shortness of breath.   Gastrointestinal:  Positive for constipation and nausea.  Musculoskeletal:  Positive for gait problem.       Spasms  Neurological:  Positive for dizziness, tremors and weakness.  Psychiatric/Behavioral:  Positive for dysphoric mood. The patient is nervous/anxious.   All other systems reviewed and are negative.      Objective:   Physical Exam Gen: no distress, normal appearing HEENT: oral mucosa pink and moist, NCAT Cardio: Reg rate Chest: normal effort, normal rate of breathing Abd: soft, non-distended Ext: +lower extremity edena, improved Psych: pleasant, normal affect Skin: intact Neuro: Alert and oriented x3. Slow speech MSK: protracted cervical posture    Assessment & Plan:   1) Chronic Pain Syndrome  with diffuse pain secondary to Polymylagia rheumatica -Discussed current symptoms of pain and history of pain.  -Discussed benefits of exercise in reducing pain. -discussed that the only thing that has been helpful was IV steroids. Continue, recommended  discussing with rheumatology increasing frequency to q6 weeks.  -discontinue cymbalta 20mg  daily.  -discussed that steroids are helping -continue ergocalciferol 50,000U  -will send message to her care team (Dr. Carmelia Roller PCP, Dr. Antoine Poche cardiologist, Dr. Isabelle Course rheumatologist) to discuss doing IV steroid treatment.  -will call her rheumatologist today to discuss Kevzara and Plaquenil.  -discussed her follow-up with rheumatology, who placed her on low dose prednisone 5mg  BID.  -Discussed following foods that may reduce pain: 1) Ginger (especially studied for arthritis)- reduce leukotriene production to decrease inflammation 2) Blueberries- high in phytonutrients that decrease inflammation 3) Salmon- marine omega-3s reduce joint swelling and pain 4) Pumpkin seeds- reduce inflammation 5) dark chocolate- reduces inflammation 6) turmeric- reduces inflammation 7) tart cherries - reduce pain and stiffness 8) extra virgin olive oil - its compound olecanthal helps to block prostaglandins  9) chili peppers- can be eaten or applied topically via capsaicin 10) mint- helpful for headache, muscle aches, joint pain, and itching 11) garlic- reduces inflammation  Link to further information on diet for chronic pain: [email or web address]    2) Shortness of breath secondary to lung cancer -discussed that it started one year ago -discussed that cardio pulmonary workup has been normal -discussed that her IV steroids were very helpful.  -ordered pulmonary rehab  3) Muscle spasms: -prescribed robaxin  4) Cervicalgia: -.Prescribed Zynex Nexwave, cervical traction device -discussed trigger point injections   5) Movement disorder: -referred to [facility] -discussed that she cannot do MRIs.   6) Muscle spasms: -fled/c flexeril -discussed her negative response to robaxin  7)  Hypotension: -increase midodrine to 10mg  BID -continue fludrocortisone -discussed abdominal binder and teds -discussed that steroids can help -discussed that flomax can cause this, switch to night -encouraged adequate hydration  8) CKD: Cr monitored and Cr is stable.   9) Iron deficiency: -discussed dietary sources of iron such as red meat, green leafy vegetables, chickpeas

## 2023-02-24 ENCOUNTER — Ambulatory Visit (INDEPENDENT_AMBULATORY_CARE_PROVIDER_SITE_OTHER): Payer: Medicare PPO | Admitting: Family Medicine

## 2023-02-24 ENCOUNTER — Encounter: Payer: Self-pay | Admitting: Family Medicine

## 2023-02-24 VITALS — BP 92/60 | HR 65 | Temp 98.0°F | Ht 63.5 in | Wt 150.0 lb

## 2023-02-24 DIAGNOSIS — Z0000 Encounter for general adult medical examination without abnormal findings: Secondary | ICD-10-CM | POA: Diagnosis not present

## 2023-02-24 DIAGNOSIS — I959 Hypotension, unspecified: Secondary | ICD-10-CM | POA: Diagnosis not present

## 2023-02-24 LAB — LIPID PANEL
Cholesterol: 154 mg/dL (ref 0–200)
HDL: 61.4 mg/dL (ref >39.00)
LDL Cholesterol: 74 mg/dL (ref 0–99)
NonHDL: 92.24
Total CHOL/HDL Ratio: 3
Triglycerides: 90 mg/dL (ref 0.0–149.0)
VLDL: 18 mg/dL (ref 0.0–40.0)

## 2023-02-24 MED ORDER — MIDODRINE HCL 10 MG PO TABS: 10.0000 mg | ORAL_TABLET | Freq: Three times a day (TID) | ORAL | 5 refills | Status: DC

## 2023-02-24 NOTE — Patient Instructions (Addendum)
Give [place] 2-3 business days to get the results of your labs back.   Keep the diet clean and stay active.  Aim to do some physical exertion for 150 minutes per week. This is typically divided into 5 days per week, 30 minutes per day. The activity should be enough to get your heart rate up. Anything is better than nothing if you have time constraints. Get in what you can with how you feel.  Please get me a copy of your advanced directive form at your convenience.   Salt your food.  Consider compression stockings.  Stay hydrated.   Let us know if you need anything.

## 2023-02-24 NOTE — Progress Notes (Signed)
Chief Complaint  Patient presents with   Annual Exam     Well Woman Megan Brewer is here for a complete physical.   Her last physical was >1 year ago.  Current diet: in general, a "healthy" diet. Current exercise: limited. Weight is stable and she confirms daytime fatigue. Seatbelt? Yes Advanced directive? No  Health Maintenance Colonoscopy- Yes Shingrix- Yes Lung cancer screening- N/A DEXA- Yes Mammogram- Yes Tetanus- Yes Pneumonia- Yes Hep C screen- Yes  Hypotension: Hx of hypotension. Currently on midodrine 10 mg bid. Reports compliance, no AE's. Feels better on it. BP ranges from 50-90 SBP at home.   Past Medical History:  Diagnosis Date   Anemia    Arachnoiditis    Bipolar disorder (HCC)    Cataract    removed   Chronic post-thoracotomy pain    CKD (chronic kidney disease)    GERD (gastroesophageal reflux disease)    Hypogammaglobulinemia (HCC)    Hypotension    Hypothyroid    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HCC)    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HCC)    Lung cancer (HCC) 2011, 2014   Memory loss    Migraine    Opioid abuse (HCC)    Osteoporosis    PMR (polymyalgia rheumatica) (HCC) 03/17/2022   Presence of neurostimulator    Restless legs    Sleep apnea      Past Surgical History:  Procedure Laterality Date   ABDOMINAL HYSTERECTOMY     CARPAL TUNNEL RELEASE     CATARACT EXTRACTION Bilateral 2019   CHOLECYSTECTOMY     COLONOSCOPY  2015   [facility]   CT LUNG SCREENING  2018   DENTAL SURGERY  06/17/2021   4 teeth rmoved   DG  BONE DENSITY ([facility] HX)  2018   DIAGNOSTIC MAMMOGRAM  2019   GASTRIC BYPASS     LUNG CANCER SURGERY  02/2010   MULTIPLE TOOTH EXTRACTIONS     PORT A CATH REVISION Right 04/2021   PORT-A-CATH REMOVAL Left 02/07/2021   Procedure: REMOVAL PORT-A-CATH;  Surgeon: Rodman Pickle, MD;  Location: [facility];  Service: General;  Laterality: Left;  60 MINUTES ROOM 4   SPINAL CORD STIMULATOR IMPLANT      Medications   Current Outpatient Medications on File Prior to Visit  Medication Sig Dispense Refill   acetaminophen (TYLENOL) 500 MG tablet Take 1,000 mg by mouth as needed for moderate pain.     azelastine (ASTELIN) 0.1 % nasal spray Place 2 sprays into both nostrils 2 (two) times daily. (Patient taking differently: Place 2 sprays into both nostrils as needed for rhinitis or allergies.) 30 mL 5   baclofen (LIORESAL) 10 MG tablet Take 10 mg by mouth daily as needed for muscle spasms.     CALCIUM PO Take 1 tablet by mouth in the morning and at bedtime.     cefdinir (OMNICEF) 300 MG capsule Take 1 capsule (300 mg total) by mouth 2 (two) times daily for 7 days. 14 capsule 0   cholecalciferol (VITAMIN D3) 25 MCG (1000 UNIT) tablet Take 1,000 Units by mouth daily.     cycloSPORINE (RESTASIS) 0.05 % ophthalmic emulsion Place 1 drop into both eyes in the morning, at noon, in the evening, and at bedtime.     denosumab (PROLIA) 60 MG/ML SOSY injection Pt to get at office.  Appt is 02/10/23 1 mL 0   dexlansoprazole (DEXILANT) 60 MG capsule Take 1 capsule (60 mg total) by mouth daily with breakfast. 30 capsule 11  famotidine (PEPCID) 20 MG tablet Take 1 tablet (20 mg total) by mouth at bedtime. 30 tablet 11   ferrous sulfate 325 (65 FE) MG tablet Take 650 mg by mouth daily with breakfast.     fludrocortisone (FLORINEF) 0.1 MG tablet Take 2 tablets (0.2 mg total) by mouth daily for 30 doses. 60 tablet 0   GAMMAGARD 20 GM/200ML SOLN Inject 40 g into the vein every 21 ( twenty-one) days. INFUSE 40G INTRAVENOUSLY EVERY 3 WEEKS     heparin lock flush 100 UNIT/ML SOLN injection Inject 500 Units into the vein every 21 ( twenty-one) days.     ibuprofen (ADVIL) 200 MG tablet Take 600 mg by mouth as needed for mild pain.     KEVZARA 200 MG/1. SOAJ Inject 1.14 mLs into the skin every 14 (fourteen) days.     levothyroxine (SYNTHROID) 75 MCG tablet TAKE ONE (1) TABLET BY MOUTH EACH DAY BEFORE BREAKFAST ON EMPTY STOMACH  (Patient taking differently: Take 75 mcg by mouth daily before breakfast. 30 MINUTES BEFORE BREAKFAST ON EMPTY STOMACH.) 90 tablet 1   magnesium oxide (MAG-OX) 400 MG tablet Take 400 mg by mouth daily.      metroNIDAZOLE (FLAGYL) 500 MG tablet Take 1 tablet (500 mg total) by mouth 2 (two) times daily. 14 tablet 0   MYRBETRIQ 50 MG TB24 tablet Take 50 mg by mouth daily.     ondansetron (ZOFRAN) 8 MG tablet TAKE ONE TABLET BY MOUTH EVERY EIGHT HOURS AS NEEDED FOR NAUSEA OR VOMITING 20 tablet 0   potassium chloride (KLOR-CON) 10 MEQ tablet Take 2 tablets (20 mEq total) by mouth 2 (two) times daily. 360 tablet 1   potassium chloride SA (KLOR-CON M) 20 MEQ tablet Take 20 mEq by mouth 6 (six) times daily.     pyridoxine (B-6) 100 MG tablet Take 100 mg by mouth daily.     rOPINIRole (REQUIP) 0.5 MG tablet Take 0.5 mg by mouth 3 (three) times daily.     sucralfate (CARAFATE) 1 GM/10ML suspension TAKE 10 MLS (1 G TOTAL) BY MOUTH 4 TIMESDAILY AS NEEDED 420 mL 3   tamsulosin (FLOMAX) 0.4 MG CAPS capsule Take 1 capsule (0.4 mg total) by mouth daily. 30 capsule 1   torsemide (DEMADEX) 20 MG tablet Take 1 tablet (20 mg total) by mouth daily as needed (fluid, edema). 180 tablet 3   triamcinolone cream (KENALOG) 0.1 % Apply 1 application  topically 2 (two) times daily as needed (rash).     vitamin B-12 (CYANOCOBALAMIN) 1000 MCG tablet Take 1,000 mcg by mouth daily.     Vitamin D, Ergocalciferol, (DRISDOL) 1.25 MG (50000 UNIT) CAPS capsule TAKE 1 CAPSULE ONCE WEEKLY 20 capsule 0   ZINC OXIDE, TOPICAL, 10 % CREA Apply 1 application topically 2 (two) times daily as needed (rash).      Allergies Allergies  Allergen Reactions   Butorphanol Hives, Other (See Comments) and Swelling    Cerebral pain, "like someone is brushing my brain with a brillo pad"  Other Reaction(s): Not available   Erythromycin Base Diarrhea, Itching and Other (See Comments)    Severe diarrhea   Pregabalin    Pregabalin Er     Propoxyphene Nausea Only and Other (See Comments)    Depression, Agitation, Blurred Vision  Other Reaction(s): Not available   Sumatriptan Nausea And Vomiting, Other (See Comments) and Tinitus    Ringing in the ears, migraines are worse  Other Reaction(s): Not available   Tetracycline Nausea Only, Other (See  Comments) and Tinitus    Causes ringing in ears, dizziness, migraine, nausea  Other Reaction(s): Not available   Ciprofloxacin Hives, Other (See Comments) and Tinitus    Headache, dizziness, ringing in the ears  Other Reaction(s): Not available   Corticosteroids Other (See Comments)    12/02/2016 interview by Satira Mccallum: Oral dosing causes migraines/nausea/tinnitus. Prev tolerated IV and intranasal admin w/o difficulty.   Doxycycline Other (See Comments)    unknown  Other Reaction(s): Not available   Cefixime Other (See Comments)    Headache  Other Reaction(s): Not available   Gabapentin Other (See Comments) and Swelling    Throat swells/closes  Other Reaction(s): Not available   Isometheptene-Dichloral-Apap Other (See Comments)    Unknown, mood liability   Ketorolac     12/02/2016 interview by ZOX: Oral dosing prednisone/corticosteroids causes migraines/nausea/tinnitus. Prev tolerated IV and intranasal admin w/o difficulty.  Other Reaction(s): Not available   Prednisone Other (See Comments)    Migraine, dizzy, ringing in ears-can take it IV  12/02/2016 interview by [name]: Oral prednisone dosing causes migraines/nausea/tinnitus. Prev tolerated IV and intranasal admin w/o difficulty.  Other Reaction(s): Not available   Promethazine Other (See Comments)    causes severe abdominal pain when taken IV; however she can take PO or IM  Other Reaction(s): Not available   Tape Other (See Comments)    Adhesive tapes-patient denies    Review of Systems: Constitutional:  no fevers Eye:  no recent significant change in vision Ears:  No changes in hearing Nose/Mouth/Throat:  no  complaints of nasal congestion, no sore throat Cardiovascular: no chest pain Respiratory:  No new shortness of breath Gastrointestinal:  No change in bowel habits GU:  Female: negative for dysuria Integumentary:  no abnormal skin lesions reported Neurologic: +tremor Endocrine:  denies unexplained weight changes  Exam BP 92/60 (BP Location: Right Arm, Patient Position: Sitting, Cuff Size: Normal)   Pulse 65   Temp 98 F (36.7 C) (Oral)   Ht 5' 3.5" (1.613 m)   Wt 150 lb (68 kg)   SpO2 99%   BMI 26.15 kg/m  General: In no apparent or immediate distress Skin:  no significant moles, warts, or growths on exposed skin Head:  no masses, lesions, or tenderness Eyes:  pupils equal and round, sclera anicteric without injection Ears:  canals without lesions, TMs shiny without retraction, no obvious effusion, no erythema Nose:  nares patent, mucosa normal, and no drainage Throat/Pharynx:  lips and gingiva without lesion; tongue and uvula midline; non-inflamed pharynx; no exudates or postnasal drainage Neck: neck supple without adenopathy, thyromegaly, or masses Lungs: +conversational dyspnea, clear to auscultation, breath sounds equal bilaterally, no respiratory distress Cardio:  regular rate and rhythm, no bruits or LE edema Abdomen:  abdomen soft, nontender; bowel sounds normal; no masses or organomegaly Genital: Deferred Neuro:  no cerebellar signs, grip strength adequate (keeled over with fatigue immediately afterwards) Psych: well oriented with normal range of affect and appropriate judgment/insight  Assessment and Plan  Well adult exam - Plan: Lipid panel  Hypotension, unspecified hypotension type - Plan: midodrine (PROAMATINE) 10 MG tablet   Well 77 y.o. female. Counseled on diet and exercise. Advanced directive form provided today.  Other orders as above. Hypotension: Chronic, uncontrolled. Increase midodrine 10 mg bid to TID. Echo thru [facility] without evidence of  effusion/tamponade. Has f/u with cards next mo. Compression stockings rec'd along with pushing fluids and salting foods.  Follow up in 6 mo. The patient voiced understanding and agreement to the plan.  Jilda Roche Machias, DO 02/24/23 8:59 AM

## 2023-02-26 DIAGNOSIS — M542 Cervicalgia: Secondary | ICD-10-CM | POA: Diagnosis not present

## 2023-02-26 DIAGNOSIS — M62838 Other muscle spasm: Secondary | ICD-10-CM | POA: Diagnosis not present

## 2023-03-01 ENCOUNTER — Telehealth: Payer: Self-pay

## 2023-03-01 NOTE — Telephone Encounter (Signed)
Transition Care Management Unsuccessful Follow-up Telephone Call  Date of discharge and from where:  [facility]  Attempts:  1st Attempt  Reason for unsuccessful TCM follow-up call:  No answer/busy   Lenard Forth [facility], [facility] [phone] [address] [place], [place], [place] [address] Phone: [phone] Email: [email or web address]

## 2023-03-03 ENCOUNTER — Other Ambulatory Visit: Payer: Self-pay | Admitting: Family Medicine

## 2023-03-03 DIAGNOSIS — G8929 Other chronic pain: Secondary | ICD-10-CM

## 2023-03-03 NOTE — Telephone Encounter (Signed)
Medication  Discontinued.

## 2023-03-04 NOTE — Telephone Encounter (Signed)
This one makes me a bit nervous with how low her BP's have been running. Could we have her run this by her PM&R doc?

## 2023-03-05 NOTE — Telephone Encounter (Signed)
Spoke to the patients husband and he will let the patient know this information. He did agree a good idea and stated she has been doing better the past couple of days.

## 2023-03-08 DIAGNOSIS — M542 Cervicalgia: Secondary | ICD-10-CM | POA: Diagnosis not present

## 2023-03-08 DIAGNOSIS — M62838 Other muscle spasm: Secondary | ICD-10-CM | POA: Diagnosis not present

## 2023-03-15 DIAGNOSIS — M353 Polymyalgia rheumatica: Secondary | ICD-10-CM | POA: Diagnosis not present

## 2023-03-15 DIAGNOSIS — D803 Selective deficiency of immunoglobulin G [IgG] subclasses: Secondary | ICD-10-CM | POA: Diagnosis not present

## 2023-03-15 DIAGNOSIS — G894 Chronic pain syndrome: Secondary | ICD-10-CM | POA: Diagnosis not present

## 2023-03-15 DIAGNOSIS — M797 Fibromyalgia: Secondary | ICD-10-CM | POA: Diagnosis not present

## 2023-03-15 DIAGNOSIS — Z79899 Other long term (current) drug therapy: Secondary | ICD-10-CM | POA: Diagnosis not present

## 2023-03-19 ENCOUNTER — Ambulatory Visit (INDEPENDENT_AMBULATORY_CARE_PROVIDER_SITE_OTHER): Payer: Medicare PPO | Admitting: *Deleted

## 2023-03-19 DIAGNOSIS — Z0000 Encounter for general adult medical examination without abnormal findings: Secondary | ICD-10-CM | POA: Diagnosis not present

## 2023-03-19 NOTE — Patient Instructions (Signed)
Megan Brewer , Thank you for taking time to come for your Medicare Wellness Visit. I appreciate your ongoing commitment to your health goals. Please review the following plan we discussed and let me know if I can assist you in the future.     This is a list of the screening recommended for you and due dates:  Health Maintenance  Topic Date Due   Flu Shot  03/18/2023   COVID-19 Vaccine (10 - 2023-24 season) 08/27/2023*   Medicare Annual Wellness Visit  03/18/2024   Mammogram  02/02/2025   Colon Cancer Screening  01/01/2027   DTaP/Tdap/Td vaccine (3 - Td or Tdap) 05/30/2030   Pneumonia Vaccine  Completed   DEXA scan (bone density measurement)  Completed   Hepatitis C Screening  Completed   Zoster (Shingles) Vaccine  Completed   HPV Vaccine  Aged Out  *Topic was postponed. The date shown is not the original due date.    Next appointment: Follow up in one year for your annual wellness visit.   Preventive Care 75 Years and Older, Female Preventive care refers to lifestyle choices and visits with your health care provider that can promote health and wellness. What does preventive care include? A yearly physical exam. This is also called an annual well check. Dental exams once or twice a year. Routine eye exams. Ask your health care provider how often you should have your eyes checked. Personal lifestyle choices, including: Daily care of your teeth and gums. Regular physical activity. Eating a healthy diet. Avoiding tobacco and drug use. Limiting alcohol use. Practicing safe sex. Taking low-dose aspirin every day. Taking vitamin and mineral supplements as recommended by your health care provider. What happens during an annual well check? The services and screenings done by your health care provider during your annual well check will depend on your age, overall health, lifestyle risk factors, and family history of disease. Counseling  Your health care provider may ask you questions  about your: Alcohol use. Tobacco use. Drug use. Emotional well-being. Home and relationship well-being. Sexual activity. Eating habits. History of falls. Memory and ability to understand (cognition). Work and work Astronomer. Reproductive health. Screening  You may have the following tests or measurements: Height, weight, and BMI. Blood pressure. Lipid and cholesterol levels. These may be checked every 5 years, or more frequently if you are over 11 years old. Skin check. Lung cancer screening. You may have this screening every year starting at age 61 if you have a 30-pack-year history of smoking and currently smoke or have quit within the past 15 years. Fecal occult blood test (FOBT) of the stool. You may have this test every year starting at age 24. Flexible sigmoidoscopy or colonoscopy. You may have a sigmoidoscopy every 5 years or a colonoscopy every 10 years starting at age 14. Hepatitis C blood test. Hepatitis B blood test. Sexually transmitted disease (STD) testing. Diabetes screening. This is done by checking your blood sugar (glucose) after you have not eaten for a while (fasting). You may have this done every 1-3 years. Bone density scan. This is done to screen for osteoporosis. You may have this done starting at age 85. Mammogram. This may be done every 1-2 years. Talk to your health care provider about how often you should have regular mammograms. Talk with your health care provider about your test results, treatment options, and if necessary, the need for more tests. Vaccines  Your health care provider may recommend certain vaccines, such as: Influenza vaccine.  This is recommended every year. Tetanus, diphtheria, and acellular pertussis (Tdap, Td) vaccine. You may need a Td booster every 10 years. Zoster vaccine. You may need this after age 67. Pneumococcal 13-valent conjugate (PCV13) vaccine. One dose is recommended after age 63. Pneumococcal polysaccharide (PPSV23)  vaccine. One dose is recommended after age 9. Talk to your health care provider about which screenings and vaccines you need and how often you need them. This information is not intended to replace advice given to you by your health care provider. Make sure you discuss any questions you have with your health care provider. Document Released: 08/30/2015 Document Revised: 04/22/2016 Document Reviewed: 06/04/2015 Elsevier Interactive Patient Education  2017 [employer].  Fall Prevention in the Home Falls can cause injuries. They can happen to people of all ages. There are many things you can do to make your home safe and to help prevent falls. What can I do on the outside of my home? Regularly fix the edges of walkways and driveways and fix any cracks. Remove anything that might make you trip as you walk through a door, such as a raised step or threshold. Trim any bushes or trees on the path to your home. Use bright outdoor lighting. Clear any walking paths of anything that might make someone trip, such as rocks or tools. Regularly check to see if handrails are loose or broken. Make sure that both sides of any steps have handrails. Any raised decks and porches should have guardrails on the edges. Have any leaves, snow, or ice cleared regularly. Use sand or salt on walking paths during winter. Clean up any spills in your garage right away. This includes oil or grease spills. What can I do in the bathroom? Use night lights. Install grab bars by the toilet and in the tub and shower. Do not use towel bars as grab bars. Use non-skid mats or decals in the tub or shower. If you need to sit down in the shower, use a plastic, non-slip stool. Keep the floor dry. Clean up any water that spills on the floor as soon as it happens. Remove soap buildup in the tub or shower regularly. Attach bath mats securely with double-sided non-slip rug tape. Do not have throw rugs and other things on the floor that  can make you trip. What can I do in the bedroom? Use night lights. Make sure that you have a light by your bed that is easy to reach. Do not use any sheets or blankets that are too big for your bed. They should not hang down onto the floor. Have a firm chair that has side arms. You can use this for support while you get dressed. Do not have throw rugs and other things on the floor that can make you trip. What can I do in the kitchen? Clean up any spills right away. Avoid walking on wet floors. Keep items that you use a lot in easy-to-reach places. If you need to reach something above you, use a strong step stool that has a grab bar. Keep electrical cords out of the way. Do not use floor polish or wax that makes floors slippery. If you must use wax, use non-skid floor wax. Do not have throw rugs and other things on the floor that can make you trip. What can I do with my stairs? Do not leave any items on the stairs. Make sure that there are handrails on both sides of the stairs and use them. Fix handrails  that are broken or loose. Make sure that handrails are as long as the stairways. Check any carpeting to make sure that it is firmly attached to the stairs. Fix any carpet that is loose or worn. Avoid having throw rugs at the top or bottom of the stairs. If you do have throw rugs, attach them to the floor with carpet tape. Make sure that you have a light switch at the top of the stairs and the bottom of the stairs. If you do not have them, ask someone to add them for you. What else can I do to help prevent falls? Wear shoes that: Do not have high heels. Have rubber bottoms. Are comfortable and fit you well. Are closed at the toe. Do not wear sandals. If you use a stepladder: Make sure that it is fully opened. Do not climb a closed stepladder. Make sure that both sides of the stepladder are locked into place. Ask someone to hold it for you, if possible. Clearly mark and make sure that you  can see: Any grab bars or handrails. First and last steps. Where the edge of each step is. Use tools that help you move around (mobility aids) if they are needed. These include: Canes. Walkers. Scooters. Crutches. Turn on the lights when you go into a dark area. Replace any light bulbs as soon as they burn out. Set up your furniture so you have a clear path. Avoid moving your furniture around. If any of your floors are uneven, fix them. If there are any pets around you, be aware of where they are. Review your medicines with your doctor. Some medicines can make you feel dizzy. This can increase your chance of falling. Ask your doctor what other things that you can do to help prevent falls. This information is not intended to replace advice given to you by your health care provider. Make sure you discuss any questions you have with your health care provider. Document Released: 05/30/2009 Document Revised: 01/09/2016 Document Reviewed: 09/07/2014 Elsevier Interactive Patient Education  2017 [employer].

## 2023-03-19 NOTE — Progress Notes (Signed)
Subjective:   Megan Brewer is a 77 y.o. female who presents for Medicare Annual (Subsequent) preventive examination.  Visit Complete: Virtual  I connected with  Megan Brewer on 03/19/23 by a audio enabled telemedicine application and verified that I am speaking with the correct person using two identifiers.  Patient Location: Home  Provider Location: Office/Clinic  I discussed the limitations of evaluation and management by telemedicine. The patient expressed understanding and agreed to proceed.   Review of Systems     Cardiac Risk Factors include: advanced age (>81men, >61 women);hypertension     Objective:    Vital Signs: Unable to obtain new vitals due to this being a telehealth visit.      03/19/2023    9:49 AM 02/18/2023   10:55 AM 02/10/2023   11:18 AM 01/01/2023   12:48 PM 12/22/2022    3:25 PM 12/22/2022   10:00 AM 11/24/2022    9:33 AM  Advanced Directives  Does Patient Have a Medical Advance Directive? Yes No No Yes No No No  Type of Estate agent of [place];Living will        Does patient want to make changes to medical advance directive? No - Patient declined   No - Patient declined     Copy of Healthcare Power of Attorney in Chart? Yes - validated most recent copy scanned in chart (See row information)        Would patient like information on creating a medical advance directive?  No - Patient declined   Yes (Inpatient - patient requests chaplain consult to create a medical advance directive) No - Patient declined     Current Medications (verified) Outpatient Encounter Medications as of 03/19/2023  Medication Sig   acetaminophen (TYLENOL) 500 MG tablet Take 1,000 mg by mouth as needed for moderate pain.   azelastine (ASTELIN) 0.1 % nasal spray Place 2 sprays into both nostrils 2 (two) times daily. (Patient taking differently: Place 2 sprays into both nostrils as needed for rhinitis or allergies.)   baclofen (LIORESAL) 10 MG tablet Take  10 mg by mouth daily as needed for muscle spasms.   CALCIUM PO Take 1 tablet by mouth in the morning and at bedtime.   cholecalciferol (VITAMIN D3) 25 MCG (1000 UNIT) tablet Take 1,000 Units by mouth daily.   cycloSPORINE (RESTASIS) 0.05 % ophthalmic emulsion Place 1 drop into both eyes in the morning, at noon, in the evening, and at bedtime.   denosumab (PROLIA) 60 MG/ML SOSY injection Pt to get at office.  Appt is 02/10/23   dexlansoprazole (DEXILANT) 60 MG capsule Take 1 capsule (60 mg total) by mouth daily with breakfast.   famotidine (PEPCID) 20 MG tablet Take 1 tablet (20 mg total) by mouth at bedtime.   ferrous sulfate 325 (65 FE) MG tablet Take 650 mg by mouth daily with breakfast.   GAMMAGARD 20 GM/200ML SOLN Inject 40 g into the vein every 21 ( twenty-one) days. INFUSE 40G INTRAVENOUSLY EVERY 3 WEEKS   heparin lock flush 100 UNIT/ML SOLN injection Inject 500 Units into the vein every 21 ( twenty-one) days.   ibuprofen (ADVIL) 200 MG tablet Take 600 mg by mouth as needed for mild pain.   KEVZARA 200 MG/1. SOAJ Inject 1.14 mLs into the skin every 14 (fourteen) days.   levothyroxine (SYNTHROID) 75 MCG tablet TAKE ONE (1) TABLET BY MOUTH EACH DAY BEFORE BREAKFAST ON EMPTY STOMACH (Patient taking differently: Take 75 mcg by mouth daily before  breakfast. 30 MINUTES BEFORE BREAKFAST ON EMPTY STOMACH.)   magnesium oxide (MAG-OX) 400 MG tablet Take 400 mg by mouth daily.    metroNIDAZOLE (FLAGYL) 500 MG tablet Take 1 tablet (500 mg total) by mouth 2 (two) times daily.   midodrine (PROAMATINE) 10 MG tablet Take 1 tablet (10 mg total) by mouth 3 (three) times daily.   MYRBETRIQ 50 MG TB24 tablet Take 50 mg by mouth daily.   ondansetron (ZOFRAN) 8 MG tablet TAKE ONE TABLET BY MOUTH EVERY EIGHT HOURS AS NEEDED FOR NAUSEA OR VOMITING   potassium chloride (KLOR-CON) 10 MEQ tablet Take 2 tablets (20 mEq total) by mouth 2 (two) times daily.   potassium chloride SA (KLOR-CON M) 20 MEQ tablet  Take 20 mEq by mouth 6 (six) times daily.   pyridoxine (B-6) 100 MG tablet Take 100 mg by mouth daily.   rOPINIRole (REQUIP) 0.5 MG tablet Take 0.5 mg by mouth 3 (three) times daily.   sucralfate (CARAFATE) 1 GM/10ML suspension TAKE 10 MLS (1 G TOTAL) BY MOUTH 4 TIMESDAILY AS NEEDED   tamsulosin (FLOMAX) 0.4 MG CAPS capsule Take 1 capsule (0.4 mg total) by mouth daily.   torsemide (DEMADEX) 20 MG tablet Take 1 tablet (20 mg total) by mouth daily as needed (fluid, edema).   triamcinolone cream (KENALOG) 0.1 % Apply 1 application  topically 2 (two) times daily as needed (rash).   vitamin B-12 (CYANOCOBALAMIN) 1000 MCG tablet Take 1,000 mcg by mouth daily.   Vitamin D, Ergocalciferol, (DRISDOL) 1.25 MG (50000 UNIT) CAPS capsule TAKE 1 CAPSULE ONCE WEEKLY   ZINC OXIDE, TOPICAL, 10 % CREA Apply 1 application topically 2 (two) times daily as needed (rash).   No facility-administered encounter medications on file as of 03/19/2023.    Allergies (verified) Butorphanol, Erythromycin base, Pregabalin, Pregabalin er, Propoxyphene, Sumatriptan, Tetracycline, Ciprofloxacin, Corticosteroids, Doxycycline, Cefixime, Gabapentin, Isometheptene-dichloral-apap, Ketorolac, Prednisone, Promethazine, and Tape   History: Past Medical History:  Diagnosis Date   Anemia    Arachnoiditis    Bipolar disorder (HCC)    Cataract    removed   Chronic post-thoracotomy pain    CKD (chronic kidney disease)    GERD (gastroesophageal reflux disease)    Hypogammaglobulinemia (HCC)    Hypotension    Hypothyroid    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HCC)    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HCC)    Lung cancer (HCC) 2011, 2014   Memory loss    Migraine    Opioid abuse (HCC)    Osteoporosis    PMR (polymyalgia rheumatica) (HCC) 03/17/2022   Presence of neurostimulator    Restless legs    Sleep apnea    Past Surgical History:  Procedure Laterality Date   ABDOMINAL HYSTERECTOMY     CARPAL TUNNEL RELEASE     CATARACT  EXTRACTION Bilateral 2019   CHOLECYSTECTOMY     COLONOSCOPY  2015   [facility]   CT LUNG SCREENING  2018   DENTAL SURGERY  06/17/2021   4 teeth rmoved   DG  BONE DENSITY ([facility] HX)  2018   DIAGNOSTIC MAMMOGRAM  2019   GASTRIC BYPASS     LUNG CANCER SURGERY  02/2010   MULTIPLE TOOTH EXTRACTIONS     PORT A CATH REVISION Right 04/2021   PORT-A-CATH REMOVAL Left 02/07/2021   Procedure: REMOVAL PORT-A-CATH;  Surgeon: Sheliah Hatch De Blanch, MD;  Location: [facility];  Service: General;  Laterality: Left;  60 MINUTES ROOM 4   SPINAL CORD STIMULATOR IMPLANT     Family History  Problem Relation Age of Onset   Hypertension Mother    GER disease Mother    Dementia Mother    Osteoporosis Mother    Arthritis Mother    Bipolar disorder Mother    Parkinson's disease Father    Congestive Heart Failure Father    Pneumonia Father    Arthritis Father    Dementia Father    Depression Father    Asthma Sister    Depression Sister    Bipolar disorder Brother    Asthma Daughter    Colon cancer Neg Hx    Esophageal cancer Neg Hx    Stomach cancer Neg Hx    Rectal cancer Neg Hx    Social History   Socioeconomic History   Marital status: Married    Spouse name: Not on file   Number of children: 2   Years of education: Not on file   Highest education level: GED or equivalent  Occupational History   Occupation: retired  Tobacco Use   Smoking status: Never   Smokeless tobacco: Never  Vaping Use   Vaping status: Never Used  Substance and Sexual Activity   Alcohol use: Not Currently   Drug use: Never   Sexual activity: Not on file  Other Topics Concern   Not on file  Social History Narrative   Diet: None      Caffeine: coffee, tea, sodas less than or 1 daily.      Married, if yes what year: Yes, 1972      Do you live in a house, apartment, assisted living, condo, trailer, ect: Two story house       Pets: None      Current/Past profession: Teach      Exercise: None         Living  Will: No   DNR: No   POA/HPOA: No      Functional Status:   Do you have difficulty bathing or dressing yourself? yes   Do you have difficulty preparing food or eating? yes   Do you have difficulty managing your medications? yes   Do you have difficulty managing your finances?yes   Do you have difficulty affording your medications? No      Right Handed    Lives in a two story home    Social Determinants of Health   Financial Resource Strain: Low Risk  (12/16/2022)   Received from [facility]   Overall Financial Resource Strain (CARDIA)    Difficulty of Paying Living Expenses: Not hard at all  Food Insecurity: No Food Insecurity (12/25/2022)   Hunger Vital Sign    Worried About Running Out of Food in the Last Year: Never true    Ran Out of Food in the Last Year: Never true  Transportation Needs: No Transportation Needs (12/25/2022)   PRAPARE - Administrator, Civil Service (Medical): No    Lack of Transportation (Non-Medical): No  Physical Activity: Inactive (03/19/2023)   Exercise Vital Sign    Days of Exercise per Week: 0 days    Minutes of Exercise per Session: 0 min  Stress: No Stress Concern Present (03/19/2023)   [employer] of Occupational Health - Occupational Stress Questionnaire    Feeling of Stress : Not at all  Social Connections: Socially Isolated (03/19/2023)   Social Connection and Isolation Panel [NHANES]    Frequency of Communication with Friends and Family: Once a week    Frequency of Social  Gatherings with Friends and Family: Once a week    Attends Religious Services: Never    Database administrator or Organizations: No    Attends Engineer, structural: Never    Marital Status: Married    Tobacco Counseling Counseling given: Not Answered   Clinical Intake:  Pre-visit preparation completed: Yes  Pain : No/denies pain  Nutritional Risks: None Diabetes: No  How often do you need to  have someone help you when you read instructions, pamphlets, or other written materials from your doctor or pharmacy?: 1 - Never  Interpreter Needed?: No  Information entered by :: [employer], CMA   Activities of Daily Living    03/19/2023    9:41 AM 12/22/2022    3:25 PM  In your present state of health, do you have any difficulty performing the following activities:  Hearing? 1 0  Comment wears hearing aids   Vision? 0 0  Difficulty concentrating or making decisions? 1 0  Comment memory-word finding difficulty   Walking or climbing stairs? 1 1  Dressing or bathing? 0 0  Doing errands, shopping? 1 1  Comment husband Biomedical scientist and eating ? Y   Comment preparing meals   Using the Toilet? N   In the past six months, have you accidently leaked urine? Y   Do you have problems with loss of bowel control? N   Managing your Medications? N   Managing your Finances? N   Housekeeping or managing your Housekeeping? N     Patient Care Team: Sharlene Dory, DO as PCP - General (Family Medicine) Rollene Rotunda, MD as PCP - Cardiology (Cardiology) Glendale Chard, DO as Consulting Physician (Neurology) Colletta Maryland, RN as [facility] Care Management  Indicate any recent Medical Services you may have received from other than [facility] providers in the past year (date may be approximate).     Assessment:   This is a routine wellness examination for Esmeralda.  Hearing/Vision screen No results found.  Dietary issues and exercise activities discussed:     Goals Addressed   None    Depression Screen    03/19/2023    9:48 AM 02/22/2023    2:24 PM 10/27/2022   11:10 AM 06/11/2022   10:09 AM 09/30/2021   11:02 AM 06/30/2021    4:05 PM 02/04/2021    3:28 PM  PHQ 2/9 Scores  PHQ - 2 Score 0 2 2 6  0  0  PHQ- 9 Score    23     Exception Documentation      Other- indicate reason in comment box   Not completed      Seeing psych and stable x 25 years per  patient     Fall Risk    03/19/2023    9:45 AM 02/22/2023    2:24 PM 10/27/2022   11:10 AM 09/01/2022    1:59 PM 08/24/2022    2:20 PM  Fall Risk   Falls in the past year? 1 0 1 1 1   Comment   Last fall in Feb 2024. No injury.    Number falls in past yr: 1 0 1 1 1   Injury with Fall? 0 0 0 1 1  Risk for fall due to : History of fall(s);Other (Comment)   History of fall(s)   Risk for fall due to: Comment orthostatic hypotension      Follow up Falls evaluation completed    Falls evaluation completed  MEDICARE RISK AT HOME:  Medicare Risk at Home - 03/19/23 0944     Any stairs in or around the home? Yes    If so, are there any without handrails? No    Home free of loose throw rugs in walkways, pet beds, electrical cords, etc? Yes    Adequate lighting in your home to reduce risk of falls? Yes    Life alert? Yes    Use of a cane, walker or w/c? No    Grab bars in the bathroom? No    Shower chair or bench in shower? Yes   built in seat   Elevated toilet seat or a handicapped toilet? Yes             TIMED UP AND GO:  Was the test performed?  No    Cognitive Function:        03/19/2023    9:50 AM 09/30/2021   11:05 AM 09/27/2020   10:59 AM 09/25/2019    1:08 PM  6CIT Screen  What Year? 0 points 0 points 0 points 0 points  What month? 0 points 0 points 0 points 0 points  What time? 0 points 0 points 0 points 0 points  Count back from 20 0 points 0 points 2 points 0 points  Months in reverse 0 points 0 points 0 points 0 points  Repeat phrase 0 points 0 points 0 points 2 points  Total Score 0 points 0 points 2 points 2 points    Immunizations Immunization History  Administered Date(s) Administered   COVID-19, mRNA, vaccine(Comirnaty)12 years and older 06/03/2022   Fluad Quad(high Dose 65+) 06/06/2019, 05/13/2020, 05/21/2021, 06/03/2022   Influenza, High Dose Seasonal PF 06/06/2019, 05/13/2020, 05/21/2021   Influenza, Seasonal, Injecte, Preservative Fre 11/14/2012    Influenza,inj,Quad PF,6+ Mos 05/24/2018   Influenza-Unspecified 11/14/2012, 04/17/2016, 06/09/2017, 05/24/2018   PFIZER Comirnaty(Gray Top)Covid-19 Tri-Sucrose Vaccine 09/07/2019, 09/28/2019, 03/30/2020, 12/03/2020   PFIZER(Purple Top)SARS-COV-2 Vaccination 09/07/2019, 09/28/2019, 03/30/2020   Pfizer Covid-19 Vaccine Bivalent Booster 41yrs & up 05/21/2021   Pneumococcal Conjugate-13 07/29/2017   Pneumococcal Polysaccharide-23 07/29/2017, 06/10/2020   Respiratory Syncytial Virus Vaccine,Recomb Aduvanted(Arexvy) 06/11/2022   Td 05/30/2020   Tdap 05/30/2017   Zoster Recombinant(Shingrix) 06/17/2017, 07/25/2021   Zoster, Unspecified 06/17/2017    TDAP status: Up to date  Flu Vaccine status: Due, Education has been provided regarding the importance of this vaccine. Advised may receive this vaccine at local pharmacy or Health Dept. Aware to provide a copy of the vaccination record if obtained from local pharmacy or Health Dept. Verbalized acceptance and understanding.  Pneumococcal vaccine status: Up to date  Covid-19 vaccine status: Information provided on how to obtain vaccines.   Qualifies for Shingles Vaccine? Yes   Zostavax completed Yes   Shingrix Completed?: Yes  Screening Tests Health Maintenance  Topic Date Due   Medicare Annual Wellness (AWV)  09/30/2022   INFLUENZA VACCINE  03/18/2023   COVID-19 Vaccine (10 - 2023-24 season) 08/27/2023 (Originally 07/29/2022)   MAMMOGRAM  02/02/2025   Colonoscopy  01/01/2027   DTaP/Tdap/Td (3 - Td or Tdap) 05/30/2030   Pneumonia Vaccine 58+ Years old  Completed   DEXA SCAN  Completed   Hepatitis C Screening  Completed   Zoster Vaccines- Shingrix  Completed   HPV VACCINES  Aged Out    Health Maintenance  Health Maintenance Due  Topic Date Due   Medicare Annual Wellness (AWV)  09/30/2022   INFLUENZA VACCINE  03/18/2023    Colorectal cancer screening: Type of  screening: Colonoscopy. Completed 01/01/20. Repeat every 7  years  Mammogram status: Completed 02/03/23. Repeat every year  Bone Density status: Completed 07/24/22. Results reflect: Bone density results: OSTEOPOROSIS. Repeat every 2 years.  Lung Cancer Screening: (Low Dose CT Chest recommended if Age 56-80 years, 20 pack-year currently smoking OR have quit w/in 15years.) does not qualify.   Additional Screening:  Hepatitis C Screening: does qualify; Completed 06/10/20  Vision Screening: Recommended annual ophthalmology exams for early detection of glaucoma and other disorders of the eye. Is the patient up to date with their annual eye exam?  Yes  Who is the provider or what is the name of the office in which the patient attends annual eye exams? Goes to someone in [place] If pt is not established with a provider, would they like to be referred to a provider to establish care? No .   Dental Screening: Recommended annual dental exams for proper oral hygiene  Diabetic Foot Exam: N/a  Community Resource Referral / Chronic Care Management: CRR required this visit?  No   CCM required this visit?  No     Plan:     I have personally reviewed and noted the following in the patient's chart:   Medical and social history Use of alcohol, tobacco or illicit drugs  Current medications and supplements including opioid prescriptions. Patient is not currently taking opioid prescriptions. Functional ability and status Nutritional status Physical activity Advanced directives List of other physicians Hospitalizations, surgeries, and ER visits in previous 12 months Vitals Screenings to include cognitive, depression, and falls Referrals and appointments  In addition, I have reviewed and discussed with patient certain preventive protocols, quality metrics, and best practice recommendations. A written personalized care plan for preventive services as well as general preventive health recommendations were provided to patient.     Donne Anon,  CMA   03/19/2023   After Visit Summary: (MyChart) Due to this being a telephonic visit, the after visit summary with patients personalized plan was offered to patient via MyChart   Nurse Notes: None

## 2023-03-20 NOTE — Progress Notes (Deleted)
Cardiology Office Note:  .   Date:  03/20/2023  ID:   [name], DOB 1946-04-29, MRN [id number] PCP: Sharlene Dory, DO  [facility] Providers Cardiologist:  Rollene Rotunda, MD    History of Present Illness: .   Megan Brewer is a 77 y.o. female with a past medical history of orthostatic hypotension/hypotension, mild-moderate MR and TR, LBBB, near syncope, weakness, migraines, restless leg syndrome, GERD, depression, hypothyroidism, history of right lung cancer s/p lobectomy and chemotherapy. Patient is followed by Dr. Antoine Poche and presents today for an ED follow up appointment.   Per chart review, patient was previously followed by Dr. Tomie China. She had undergone a nuclear stress test in 03/2019 for evaluation of chest pain that was a normal, low risk study without evidence of ischemia or infarction. Patient was later admitted in 01/2021 with sepsis. She was found to have elevated troponins. Underwent echocardiogram on 01/15/21 that showed EF 55-60%, normal RV function, mild-moderate mitral regurgitation, mild-moderate tricuspid valve regurgitation, mild-moderate aortic valve regurgitation. There were plans to pursue outpatient nuclear stress testing, but when patient was seen in follow up she complained of weakness and had C.dif, so stress test was postponed.  In 03/2021, patient had an episode of weakness that caused her to collapse and chest pain. She was sent to the ED where her enzymes were negative and EKG was without ischemic changes. She was seen in follow up by Dr. Antoine Poche on 04/09/21. Underwent nuclear stress test on 04/29/21 that was a low risk study without evidence of ischemia.   Patient was last seen by Dr. Antoine Poche on 02/09/23 for evaluation of low BP. She had been seen in the ED on 6/3 for evaluation of low BP. She was started on midodrine. When she saw Dr. Antoine Poche, it was noted that her weakness had progressed and she was in a wheelchair in the house. She was  able to transfer from her bed to the wheelchair. She had received IVIG the day prior. In clinic, her BP was 86/56. She was sent to the ED for further evaluation. In the ED, patient was given I L lactated Ringer's and her symptoms significantly improved. Patient was not hypotensive in the ED. She saw her PCP the next day, and had another episode of hypotension. Her PCP sent her back to the ED. In the ED, patient's BP was normal both when lying down and when sitting up. Her Florinef was increased to 0.2 mg daily. She was again seen in the ED on 7/7 with orthostatic hypotension. Patient had been started on antibiotics for a UTI, and she had increased weakness since starting her antibiotics. In the ED, her SBP Was in the 90s-100s. Patient was given 1 L fluids. She had improvement in symptoms and was discharged from the ED. She was seen by Physical Medicine on 7/8, and her midodrine was increased to 10 mg BID. Remained on fludrocortisone. She was encouraged to use abdominal binder and ted hose.   Orthostatic Hypotension  - Patient has been seen in the ED multiple times in the past 2 months for orthostatic hypotension and hypotension  - Now, patient is on midodrine 10 mg TID and fludrocortisone 0.2*** - Discussed need for abdominal binder thigh high compression stockings   Mild-Moderate MR and TR Mild-Moderate AI  - Noted on echocardiogram from 01/2021  - Ordered repeat echocardiogram   ROS: ***  Studies Reviewed: .        *** Risk Assessment/Calculations:   {Does this patient  have ATRIAL FIBRILLATION?:[id number]} No BP recorded.  {Refresh Note OR Click here to enter BP  :1}***       Physical Exam:   VS:  There were no vitals taken for this visit.   Wt Readings from Last 3 Encounters:  02/24/23 150 lb (68 kg)  02/21/23 150 lb (68 kg)  02/18/23 140 lb (63.5 kg)    GEN: Well nourished, well developed in no acute distress NECK: No JVD; No carotid bruits CARDIAC: ***RRR, no murmurs, rubs,  gallops RESPIRATORY:  Clear to auscultation without rales, wheezing or rhonchi  ABDOMEN: Soft, non-tender, non-distended EXTREMITIES:  No edema; No deformity   ASSESSMENT AND PLAN: .   ***    {Are you ordering a CV Procedure (e.g. stress test, cath, DCCV, TEE, etc)?   Press F2        :[id number]}  Dispo: ***  Signed, Jonita Albee, PA-C

## 2023-03-24 ENCOUNTER — Emergency Department (HOSPITAL_COMMUNITY): Payer: Medicare PPO

## 2023-03-24 ENCOUNTER — Other Ambulatory Visit: Payer: Self-pay

## 2023-03-24 ENCOUNTER — Emergency Department (HOSPITAL_COMMUNITY)
Admission: EM | Admit: 2023-03-24 | Discharge: 2023-03-24 | Disposition: A | Discharge status: Home / Self Care | Payer: Medicare PPO | Attending: Emergency Medicine | Admitting: Emergency Medicine

## 2023-03-24 ENCOUNTER — Ambulatory Visit: Payer: Medicare PPO

## 2023-03-24 ENCOUNTER — Encounter (HOSPITAL_COMMUNITY): Payer: Self-pay | Admitting: Emergency Medicine

## 2023-03-24 DIAGNOSIS — Z043 Encounter for examination and observation following other accident: Secondary | ICD-10-CM | POA: Diagnosis not present

## 2023-03-24 DIAGNOSIS — I129 Hypertensive chronic kidney disease with stage 1 through stage 4 chronic kidney disease, or unspecified chronic kidney disease: Secondary | ICD-10-CM | POA: Insufficient documentation

## 2023-03-24 DIAGNOSIS — Y92002 Bathroom of unspecified non-institutional (private) residence single-family (private) house as the place of occurrence of the external cause: Secondary | ICD-10-CM | POA: Insufficient documentation

## 2023-03-24 DIAGNOSIS — E039 Hypothyroidism, unspecified: Secondary | ICD-10-CM | POA: Insufficient documentation

## 2023-03-24 DIAGNOSIS — W19XXXA Unspecified fall, initial encounter: Secondary | ICD-10-CM

## 2023-03-24 DIAGNOSIS — S298XXA Other specified injuries of thorax, initial encounter: Secondary | ICD-10-CM

## 2023-03-24 DIAGNOSIS — N189 Chronic kidney disease, unspecified: Secondary | ICD-10-CM | POA: Diagnosis not present

## 2023-03-24 DIAGNOSIS — W010XXA Fall on same level from slipping, tripping and stumbling without subsequent striking against object, initial encounter: Secondary | ICD-10-CM | POA: Insufficient documentation

## 2023-03-24 DIAGNOSIS — R0781 Pleurodynia: Secondary | ICD-10-CM | POA: Insufficient documentation

## 2023-03-24 DIAGNOSIS — I6782 Cerebral ischemia: Secondary | ICD-10-CM | POA: Insufficient documentation

## 2023-03-24 DIAGNOSIS — I700 Atherosclerosis of aorta: Secondary | ICD-10-CM | POA: Diagnosis not present

## 2023-03-24 DIAGNOSIS — S0990XA Unspecified injury of head, initial encounter: Secondary | ICD-10-CM | POA: Diagnosis not present

## 2023-03-24 DIAGNOSIS — R58 Hemorrhage, not elsewhere classified: Secondary | ICD-10-CM | POA: Diagnosis not present

## 2023-03-24 DIAGNOSIS — Z85118 Personal history of other malignant neoplasm of bronchus and lung: Secondary | ICD-10-CM | POA: Insufficient documentation

## 2023-03-24 DIAGNOSIS — K7689 Other specified diseases of liver: Secondary | ICD-10-CM | POA: Diagnosis not present

## 2023-03-24 DIAGNOSIS — S299XXA Unspecified injury of thorax, initial encounter: Secondary | ICD-10-CM | POA: Insufficient documentation

## 2023-03-24 DIAGNOSIS — I959 Hypotension, unspecified: Secondary | ICD-10-CM | POA: Diagnosis not present

## 2023-03-24 DIAGNOSIS — Z79890 Hormone replacement therapy (postmenopausal): Secondary | ICD-10-CM | POA: Insufficient documentation

## 2023-03-24 LAB — COMPREHENSIVE METABOLIC PANEL
ALT: 55 U/L — ABNORMAL HIGH (ref 0–44)
AST: 63 U/L — ABNORMAL HIGH (ref 15–41)
Albumin: 2.9 g/dL — ABNORMAL LOW (ref 3.5–5.0)
Alkaline Phosphatase: 50 U/L (ref 38–126)
Anion gap: 9 (ref 5–15)
BUN: 46 mg/dL — ABNORMAL HIGH (ref 8–23)
CO2: 25 mmol/L (ref 22–32)
Calcium: 9.4 mg/dL (ref 8.9–10.3)
Chloride: 104 mmol/L (ref 98–111)
Creatinine, Ser: 1.58 mg/dL — ABNORMAL HIGH (ref 0.44–1.00)
GFR, Estimated: 34 mL/min — ABNORMAL LOW (ref >60)
Glucose, Bld: 115 mg/dL — ABNORMAL HIGH (ref 70–99)
Potassium: 3.2 mmol/L — ABNORMAL LOW (ref 3.5–5.1)
Sodium: 138 mmol/L (ref 135–145)
Total Bilirubin: 0.4 mg/dL (ref 0.3–1.2)
Total Protein: 6.6 g/dL (ref 6.5–8.1)

## 2023-03-24 LAB — CBC
HCT: 34.2 % — ABNORMAL LOW (ref 36.0–46.0)
Hemoglobin: 11.7 g/dL — ABNORMAL LOW (ref 12.0–15.0)
MCH: 30.7 pg (ref 26.0–34.0)
MCHC: 34.2 g/dL (ref 30.0–36.0)
MCV: 89.8 fL (ref 80.0–100.0)
Platelets: 138 10*3/uL — ABNORMAL LOW (ref 150–400)
RBC: 3.81 MIL/uL — ABNORMAL LOW (ref 3.87–5.11)
RDW: 12.8 % (ref 11.5–15.5)
WBC: 6 10*3/uL (ref 4.0–10.5)
nRBC: 0 % (ref 0.0–0.2)

## 2023-03-24 MED ORDER — FENTANYL CITRATE PF 50 MCG/ML IJ SOSY
50.0000 ug | PREFILLED_SYRINGE | Freq: Once | INTRAMUSCULAR | Status: AC
  Administered 2023-03-24: 50 ug via INTRAVENOUS
  Filled 2023-03-24: qty 1

## 2023-03-24 MED ORDER — SODIUM CHLORIDE 0.9 % IV SOLN
1000.0000 mg | Freq: Once | INTRAVENOUS | Status: DC
  Filled 2023-03-24: qty 8
  Filled 2023-03-24: qty 16

## 2023-03-24 MED ORDER — POTASSIUM CHLORIDE CRYS ER 20 MEQ PO TBCR: 20.0000 meq | EXTENDED_RELEASE_TABLET | Freq: Two times a day (BID) | ORAL | 0 refills | Status: DC

## 2023-03-24 MED ORDER — OXYCODONE HCL 5 MG PO TABS: 2.5000 mg | ORAL_TABLET | ORAL | 0 refills | Status: DC | PRN

## 2023-03-24 MED ORDER — HEPARIN SOD (PORK) LOCK FLUSH 100 UNIT/ML IV SOLN
500.0000 [IU] | Freq: Once | INTRAVENOUS | Status: AC
  Administered 2023-03-24: 500 [IU]
  Filled 2023-03-24: qty 5

## 2023-03-24 MED ORDER — IOHEXOL 300 MG/ML  SOLN
100.0000 mL | Freq: Once | INTRAMUSCULAR | Status: AC | PRN
  Administered 2023-03-24: 80 mL via INTRAVENOUS

## 2023-03-24 NOTE — ED Triage Notes (Signed)
Pt BIB [facility] with reports of slipping on bathroom mat and hitting her head and right side. Denies blood thinner use. Pt hypotensive 90/46. Pt refused all care by paramedics.

## 2023-03-24 NOTE — ED Notes (Signed)
Accessed pts port w/ power port for CT and bloodwork

## 2023-03-24 NOTE — ED Provider Notes (Signed)
Care of patient received from prior provider at 5:40 PM, please see their note for complete H/P and care plan.  Received handoff per ED course.  Clinical Course as of 03/24/23 1740  Wed Mar 24, 2023  1659 Stable HO from NP Fall at home. F/U CT and pain control. [CC]    Clinical Course User Index [CC] Glyn Ade, MD    Reassessment: Care patient received from Dr. Rubin Payor.  On exam she is in no acute distress.  Extensive evaluation performed Dr. Rubin Payor with plan for discharge if negative.  Negative evaluation today.  Informed patient of finding of cirrhosis importance of follow-up with her PCP. She is insistent that she is having severe pain, she can barely keep her eyes open during my conversation with the patient.  However she stated that she needs to have pain medication at home due to the severity of pain with a fall.  Shared medical decision making.  Recommended using Tylenol and Advil but she said that that was not going to be sufficient.  Given her age, narcotics are high risk and I have recommended against pain medication.  However she states that she could not tolerate the pain without pain medication.  Given her understanding of risk and shared medical decision making, we will send medication for patient's utilization.      Glyn Ade, MD 03/24/23 1740

## 2023-03-24 NOTE — ED Provider Notes (Addendum)
Megan Brewer Provider Note   CSN: [id number] Arrival date & time: 03/24/23  1359     History  Chief Complaint  Patient presents with   Megan Brewer Megan Brewer is a 77 y.o. female.   Fall  Patient presents after fall.  Slipped on the bathroom mat.  Complaining of pain in right ribs.  Denies loss conscious.  Denies hitting head.  Not on blood thinners.  Pain worse with movement.    Past Medical History:  Diagnosis Date   Anemia    Arachnoiditis    Bipolar disorder (HCC)    Cataract    removed   Chronic post-thoracotomy pain    CKD (chronic kidney disease)    GERD (gastroesophageal reflux disease)    Hypogammaglobulinemia (HCC)    Hypotension    Hypothyroid    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HCC)    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HCC)    Lung cancer (HCC) 2011, 2014   Memory loss    Migraine    Opioid abuse (HCC)    Osteoporosis    PMR (polymyalgia rheumatica) (HCC) 03/17/2022   Presence of neurostimulator    Restless legs    Sleep apnea     Home Medications Prior to Admission medications   Medication Sig Start Date End Date Taking? Authorizing Provider  acetaminophen (TYLENOL) 500 MG tablet Take 1,000 mg by mouth as needed for moderate pain.    [provider]  azelastine (ASTELIN) 0.1 % nasal spray Place 2 sprays into both nostrils 2 (two) times daily. Patient taking differently: Place 2 sprays into both nostrils as needed for rhinitis or allergies. 03/10/21   Hetty Blend, FNP  baclofen (LIORESAL) 10 MG tablet Take 10 mg by mouth daily as needed for muscle spasms. 06/03/20   [provider]  CALCIUM PO Take 1 tablet by mouth in the morning and at bedtime.    [provider]  cholecalciferol (VITAMIN D3) 25 MCG (1000 UNIT) tablet Take 1,000 Units by mouth daily.    [provider]  cycloSPORINE (RESTASIS) 0.05 % ophthalmic emulsion Place 1 drop into both eyes in the morning, at  noon, in the evening, and at bedtime.    [provider]  denosumab (PROLIA) 60 MG/ML SOSY injection Pt to get at office.  Appt is 02/10/23 01/19/23   Megan Dory, DO  dexlansoprazole (DEXILANT) 60 MG capsule Take 1 capsule (60 mg total) by mouth daily with breakfast. 04/30/22   Brewer, Megan S, PA-C  famotidine (PEPCID) 20 MG tablet Take 1 tablet (20 mg total) by mouth at bedtime. 04/30/22   Brewer, Megan S, PA-C  ferrous sulfate 325 (65 FE) MG tablet Take 650 mg by mouth daily with breakfast.    [provider]  GAMMAGARD 20 GM/200ML SOLN Inject 40 g into the vein every 21 ( twenty-one) days. INFUSE 40G INTRAVENOUSLY EVERY 3 WEEKS 02/27/22   [provider]  heparin lock flush 100 UNIT/ML SOLN injection Inject 500 Units into the vein every 21 ( twenty-one) days. 03/31/21   [provider]  ibuprofen (ADVIL) 200 MG tablet Take 600 mg by mouth as needed for mild pain.    [provider]  KEVZARA 200 MG/1. SOAJ Inject 1.14 mLs into the skin every 14 (fourteen) days. 09/09/22   [provider]  levothyroxine (SYNTHROID) 75 MCG tablet TAKE ONE (1) TABLET BY MOUTH EACH DAY BEFORE BREAKFAST ON EMPTY STOMACH Patient taking differently: Take  75 mcg by mouth daily before breakfast. 30 MINUTES BEFORE BREAKFAST ON EMPTY STOMACH. 09/11/22   Megan Dory, DO  magnesium oxide (MAG-OX) 400 MG tablet Take 400 mg by mouth daily.     [provider]  metroNIDAZOLE (FLAGYL) 500 MG tablet Take 1 tablet (500 mg total) by mouth 2 (two) times daily. 02/18/23   Megan Monday, MD  midodrine (PROAMATINE) 10 MG tablet Take 1 tablet (10 mg total) by mouth 3 (three) times daily. 02/24/23   Brewer, Megan Roche, DO  MYRBETRIQ 50 MG TB24 tablet Take 50 mg by mouth daily. 10/07/22   [provider]  ondansetron (ZOFRAN) 8 MG tablet TAKE ONE TABLET BY MOUTH EVERY EIGHT HOURS AS NEEDED FOR NAUSEA OR VOMITING 02/15/23   Brewer,  Megan Roche, DO  potassium chloride (KLOR-CON) 10 MEQ tablet Take 2 tablets (20 mEq total) by mouth 2 (two) times daily. 12/29/22   Megan Dory, DO  potassium chloride SA (KLOR-CON M) 20 MEQ tablet Take 20 mEq by mouth 6 (six) times daily.    [provider]  pyridoxine (B-6) 100 MG tablet Take 100 mg by mouth daily.    Megan Dory, DO  rOPINIRole (REQUIP) 0.5 MG tablet Take 0.5 mg by mouth 3 (three) times daily.    Megan Dory, DO  sucralfate (CARAFATE) 1 GM/10ML suspension TAKE 10 MLS (1 G TOTAL) BY MOUTH 4 TIMESDAILY AS NEEDED 01/25/23   Megan Dory, DO  tamsulosin (FLOMAX) 0.4 MG CAPS capsule Take 1 capsule (0.4 mg total) by mouth daily. 01/23/21   Burnadette Pop, MD  torsemide (DEMADEX) 20 MG tablet Take 1 tablet (20 mg total) by mouth daily as needed (fluid, edema). 12/24/22   Megan Gilding, MD  triamcinolone cream (KENALOG) 0.1 % Apply 1 application  topically 2 (two) times daily as needed (rash).    [provider]  vitamin B-12 (CYANOCOBALAMIN) 1000 MCG tablet Take 1,000 mcg by mouth daily. 02/14/21   [provider]  Vitamin D, Ergocalciferol, (DRISDOL) 1.25 MG (50000 UNIT) CAPS capsule TAKE 1 CAPSULE ONCE WEEKLY 01/04/23   Megan, Drema Pry, MD  ZINC OXIDE, TOPICAL, 10 % CREA Apply 1 application topically 2 (two) times daily as needed (rash). 02/14/21   Megan Lloyd, MD      Allergies    Butorphanol, Erythromycin base, Pregabalin, Pregabalin er, Propoxyphene, Sumatriptan, Tetracycline, Ciprofloxacin, Corticosteroids, Doxycycline, Cefixime, Gabapentin, Isometheptene-dichloral-apap, Ketorolac, Prednisone, Promethazine, and Tape    Review of Systems   Review of Systems  Physical Exam Updated Vital Signs BP (!) 111/58 (BP Location: Right Arm)   Pulse 76   Temp 98.2 F (36.8 C) (Oral)   Resp 16   SpO2 96%  Physical Exam Vitals and nursing note reviewed.  Neck:     Comments: Does have some pain with movement  and some mild lower midline tenderness. Cardiovascular:     Rate and Rhythm: Regular rhythm.  Chest:     Chest wall: Tenderness present.  Abdominal:     Tenderness: There is no abdominal tenderness.  Musculoskeletal:     Cervical back: Neck supple.     Comments: Tenderness to right lateral and posterior chest wall.  No crepitance.  No subcu emphysema.  Neurological:     Mental Status: She is alert and oriented to person, place, and time.     ED Results / Procedures / Treatments   Labs (all labs ordered are listed, but only abnormal results are displayed) Labs Reviewed  COMPREHENSIVE METABOLIC  PANEL - Abnormal; Notable for the following components:      Result Value   Potassium 3.2 (*)    Glucose, Bld 115 (*)    BUN 46 (*)    Creatinine, Ser 1.58 (*)    Albumin 2.9 (*)    AST 63 (*)    ALT 55 (*)    GFR, Estimated 34 (*)    All other components within normal limits  CBC - Abnormal; Notable for the following components:   RBC 3.81 (*)    Hemoglobin 11.7 (*)    HCT 34.2 (*)    Platelets 138 (*)    All other components within normal limits    EKG None  Radiology No results found.  Procedures Procedures    Medications Ordered in ED Medications  fentaNYL (SUBLIMAZE) injection 50 mcg (50 mcg Intravenous Given 03/24/23 1533)  iohexol (OMNIPAQUE) 300 MG/ML solution 100 mL (80 mLs Intravenous Contrast Given 03/24/23 1614)    ED Course/ Medical Decision Making/ A&P                                 Medical Decision Making Amount and/or Complexity of Data Reviewed Labs: ordered. Radiology: ordered.  Risk Prescription drug management.   Patient with mechanical fall.  He slipped.  Hit right chest.  Differential diagnosis includes rib fractures, pneumothorax, even intra-abdominal or flank injury.  Has some hypertension but this is apparently chronic for the patient.  States that the blood pressure we had he is even good for her.  Given small amount of pain meds.  Blood  work reassuring.  CT scan pending.  Care turned over to Dr. Doran Durand        Final Clinical Impression(s) / ED Diagnoses Final diagnoses:  Fall, initial encounter  Blunt chest trauma, initial encounter    Rx / DC Orders ED Discharge Orders     None         Benjiman Core, MD 03/24/23 1639    Benjiman Core, MD 03/24/23 [phone]

## 2023-03-25 ENCOUNTER — Ambulatory Visit: Payer: Medicare PPO | Admitting: Cardiology

## 2023-03-25 NOTE — Telephone Encounter (Signed)
error 

## 2023-03-26 MED ORDER — SODIUM CHLORIDE 0.9 % IV SOLN
1000.0000 mg | Freq: Once | INTRAVENOUS | Status: DC
  Filled 2023-03-26: qty 16

## 2023-03-29 DIAGNOSIS — M542 Cervicalgia: Secondary | ICD-10-CM | POA: Diagnosis not present

## 2023-03-29 DIAGNOSIS — M62838 Other muscle spasm: Secondary | ICD-10-CM | POA: Diagnosis not present

## 2023-03-29 LAB — INFORMED CONSENT NEEDED

## 2023-03-30 ENCOUNTER — Ambulatory Visit (INDEPENDENT_AMBULATORY_CARE_PROVIDER_SITE_OTHER): Payer: Medicare PPO

## 2023-03-30 VITALS — BP 94/49 | HR 73 | Temp 98.0°F | Resp 18 | Ht 64.5 in | Wt 145.0 lb

## 2023-03-30 DIAGNOSIS — M353 Polymyalgia rheumatica: Secondary | ICD-10-CM

## 2023-03-30 DIAGNOSIS — D839 Common variable immunodeficiency, unspecified: Secondary | ICD-10-CM | POA: Diagnosis not present

## 2023-03-30 DIAGNOSIS — M2550 Pain in unspecified joint: Secondary | ICD-10-CM

## 2023-03-30 DIAGNOSIS — J329 Chronic sinusitis, unspecified: Secondary | ICD-10-CM | POA: Diagnosis not present

## 2023-03-30 MED ORDER — SODIUM CHLORIDE 0.9% FLUSH: 10.0000 mL | Freq: Once | INTRAVENOUS | Status: DC | PRN

## 2023-03-30 MED ORDER — SODIUM CHLORIDE 0.9 % IV SOLN
1000.0000 mg | Freq: Once | INTRAVENOUS | Status: AC
  Administered 2023-03-30: 1000 mg via INTRAVENOUS
  Filled 2023-03-30: qty 16

## 2023-03-30 MED ORDER — HEPARIN SOD (PORK) LOCK FLUSH 100 UNIT/ML IV SOLN
500.0000 [IU] | Freq: Once | INTRAVENOUS | Status: AC | PRN
  Administered 2023-03-30: 500 [IU]
  Filled 2023-03-30: qty 5

## 2023-03-30 NOTE — Progress Notes (Unsigned)
Cardiology Clinic Note   Date: 03/31/2023 ID: Ronnika, Pinch 08/06/1946, MRN [id number]  Primary Cardiologist:  Rollene Rotunda, MD  Patient Profile    Izella Choyce is a 77 y.o. female who presents to the clinic today for hospital follow up.     Past medical history significant for: Chest pain. Nuclear stress test 04/29/2021: No evidence of ischemia.  Small, mild defect in mid inferior wall improved slightly on stress imaging most consistent with artifact.  Low risk study. Palpitations. Bacteremia. Echo 02/05/2021: EF 60 to 65%.  No RWMA.  Grade I DD.  Normal RV function.  Normal PA pressure.  Mild MR/TR. LBBB. OSA. Hypothyroidism. CKD stage IIIb. Lung cancer. S/p lobectomy and chemotherapy. Migraines. Orthostatic hypotension/hypotension. IgG deficiency. Obesity.  S/p gastric bypass.     History of Present Illness    Niayla Dewan Fiorillo was evaluated by Dr. Tomie China in August 2020 after multiple visits to the ED for chest pain.  She underwent stress testing which was normal, low risk.  She was first seen by Dr. Antoine Poche on 01/15/2021 for elevated troponin during hospital admission for sepsis.  She underwent echo for suspected bacteremia which showed normal LV/RV function and mild to moderate MR/TR/AI.  Elevated troponin thought to be related to demand ischemia in the setting of sepsis and outpatient stress testing was planned.  Upon follow-up she had developed C. difficile and stress test was postponed.  In August 2022 she had an ED visit for weakness and collapse with associated chest pain.  Troponin negative and EKG without ischemic changes.  Nuclear stress testing September 2022 was normal.  Patient was last seen in the office by Dr. Antoine Poche on 02/09/2023 for hospital follow-up.  Patient was seen in the ED on 01/18/2023 for hypotension.  She was started on midodrine.  At the time of her office visit it was noted patient's patient has had progressed and she was in a  wheelchair.  She had received IVIG the day prior to visit.  BP was 86/56 and she was sent to the ED for further evaluation.  She received fluid resuscitation with improvement in her symptoms.  Upon follow-up with PCP the following day she was hypotensive and sent back to ED.  BP was normal in the ED.  Florinef increased to 0.2 mg daily.  She was seen again in the ED on 02/18/2023 and treated for UTI.  She presented back to the ED on 02/21/2023 with increased weakness and SBP in the 60s.  She reported episodes of diarrhea for 1 week.  Upon arrival to ED SBP 90-100.  She received fluid resuscitation and was discharged to follow-up with PCP.  Patient presented to the ED via EMS on 03/24/2023 for a fall at home.  She was found to be hypotensive at 90/46.  She complained of right rib pain.  CT chest abdomen pelvis was negative for traumatic injury to chest, abdomen or pelvis.  It did show possible hepatic cirrhosis with possible paraesophageal varices.  Patient was instructed to follow-up with PCP for further workup.  CT head and cervical spine were unremarkable.  At the time of discharge patient was insistent she needed narcotic pain prescription to manage her pain at home.  Patient was cautioned of risks of narcotic pain medication use given age and comorbidities.  She was provided with a short course of oxycodone.  Today, patient is here alone. She is somewhat of a difficult historian. Intake BP 80/50. Patient reports this is where her  BP has been. She denies dizziness but feels "spacey." She feels weak and unsteady with position changes and standing. She denies presyncope, syncope, chest pain, or shortness of breath at rest. She does have some DOE. She reports good hydration. She is taking midodrine 10 mg tid and Florinef 0.2 mg daily. Upon reviewing her medications, she is also taking torsemide 60 mg daily and Flomax. She does not wear compression socks or abdominal compression. She reports her PCP told her to  increase the torsemide and states "I feel so much better on the 60 mg." BP was running just as low when she was taking 20 mg of torsemide. She feels she has more edema in her abdomen since starting midodrine. She weighs most days and thinks her weight is normally 145 lb. When asked what her weight has been recently she does not say definitively what it has been running. She states she has always had low BP but over the last 4 years it has gotten worse and even worse still since around May 2024.      ROS: All other systems reviewed and are otherwise negative except as noted in History of Present Illness.  Studies Reviewed     EKG is not ordered today.          Physical Exam    VS:  BP (!) 86/52 (BP Location: Left Arm, Patient Position: Sitting, Cuff Size: Normal)   Pulse 80   Ht 5\' 4"  (1.626 m)   Wt 149 lb 9.6 oz (67.9 kg)   SpO2 97%   BMI 25.68 kg/m  , BMI Body mass index is 25.68 kg/m.  GEN: Well nourished, well developed, in no acute distress. Neck: No JVD or carotid bruits. Cardiac:  RRR. No murmurs. No rubs or gallops.   Respiratory:  Respirations regular and unlabored. Clear to auscultation without rales, wheezing or rhonchi. GI: Soft, nontender, nondistended. Extremities: Radials/DP/PT 2+ and equal bilaterally. No clubbing or cyanosis. No edema.  Skin: Warm and dry, no rash. Neuro: Strength intact.  Assessment & Plan    Hypotension: BP today 80/50 on intake and 86/52 on my recheck. Patient denies dizziness but feels "spacey." Weakness and unsteadiness only with position changes. She reports good hydration. Her mucus membranes are moist on exam today.  Torsemide was increased to 60 mg per PCP for abdominal bloating and weight gain. She is instructed to decrease torsemide to 40 mg daily. I would like her to follow up up with PCP about stopping Flomax. She is concerned about wearing compression stockings, as she was told to stop them in the past because they were cutting off her  circulation. Discussed reimplementing them or compression sleeves as tolerated and also utilizing abdominal compression.  Continue midodrine 10 mg tid and Florinef 0.2 mg daily. If compression does not help would consider increasing Florinef to 0.3 mg on follow up.   Mitral regurgitation. Echo June 2022 showed mild MR/TR. Euvolemic and well compensated on exam. Will get repeat echo for further evaluation.   Disposition: Echo. Return in 6-8 weeks or sooner as needed.          Signed, Etta Grandchild. , DNP, NP-C

## 2023-03-30 NOTE — Progress Notes (Signed)
Diagnosis: Polyarthralgia  Provider:  Chilton Greathouse MD  Procedure: IV Infusion  IV Type: Port a Cath, IV Location: R Chest  Solumedrol (Methylprednisolone), Dose: 1000 mg  Infusion Start Time: 1424  Infusion Stop Time: 1524  Post Infusion IV Care: Port a Cath Deaccessed/Flushed, Heparin  Discharge: Condition: Good, Destination: Home . AVS Provided  Performed by:  Adriana Mccallum, RN

## 2023-03-31 ENCOUNTER — Telehealth: Payer: Self-pay

## 2023-03-31 ENCOUNTER — Ambulatory Visit: Payer: Medicare PPO | Attending: Cardiology | Admitting: Student

## 2023-03-31 ENCOUNTER — Encounter: Payer: Self-pay | Admitting: Student

## 2023-03-31 VITALS — BP 86/52 | HR 80 | Ht 64.0 in | Wt 149.6 lb

## 2023-03-31 DIAGNOSIS — I340 Nonrheumatic mitral (valve) insufficiency: Secondary | ICD-10-CM | POA: Diagnosis not present

## 2023-03-31 DIAGNOSIS — I959 Hypotension, unspecified: Secondary | ICD-10-CM | POA: Diagnosis not present

## 2023-03-31 MED ORDER — TORSEMIDE 40 MG PO TABS: 20.0000 mg | ORAL_TABLET | Freq: Two times a day (BID) | ORAL | 3 refills | Status: DC

## 2023-03-31 NOTE — Telephone Encounter (Signed)
Transition Care Management Unsuccessful Follow-up Telephone Call  Date of discharge and from where:  03/24/2023 [facility]  Attempts:  1st Attempt  Reason for unsuccessful TCM follow-up call:  No answer/busy   [facility]  [facility] Population Health Community Resource Care Guide   [email or web address]  ?? [phone]   Website: [email or web address]  [email or web address]

## 2023-03-31 NOTE — Patient Instructions (Addendum)
Medication Instructions:  Decrease your torsemide from 60mg  to 40mg  daily. Begin wearing compression stockings daily. Also, sleeve and abdominal compressions as well.  *If you need a refill on your cardiac medications before your next appointment, please call your pharmacy*   Lab Work: none If you have labs (blood work) drawn today and your tests are completely normal, you will receive your results only by: MyChart Message (if you have MyChart) OR A paper copy in the mail If you have any lab test that is abnormal or we need to change your treatment, we will call you to review the results.   Testing/Procedures: Your physician has requested that you have an echocardiogram. Echocardiography is a painless test that uses sound waves to create images of your heart. It provides your doctor with information about the size and shape of your heart and how well your heart's chambers and valves are working. This procedure takes approximately one hour. There are no restrictions for this procedure. Please do NOT wear perfume, or lotions (deodorant is allowed).  Please arrive 15 minutes prior to your appointment time.    Follow-Up: At [facility], you and your health needs are our priority.  As part of our continuing mission to provide you with exceptional heart care, we have created designated Provider Care Teams.  These Care Teams include your primary Cardiologist (physician) and Advanced Practice Providers (APPs -  Physician Assistants and Nurse Practitioners) who all work together to provide you with the care you need, when you need it.  We recommend signing up for the patient portal called "MyChart".  Sign up information is provided on this After Visit Summary.  MyChart is used to connect with patients for Virtual Visits (Telemedicine).  Patients are able to view lab/test results, encounter notes, upcoming appointments, etc.  Non-urgent messages can be sent to your provider as well.   To  learn more about what you can do with MyChart, go to [email or web address].    Your next appointment:   8-10 week(s)  Provider:   Rollene Rotunda, MD

## 2023-04-01 ENCOUNTER — Telehealth: Payer: Self-pay

## 2023-04-01 NOTE — Telephone Encounter (Signed)
Transition Care Management Unsuccessful Follow-up Telephone Call  Date of discharge and from where:  03/24/2023 [facility]  Attempts:  2nd Attempt  Reason for unsuccessful TCM follow-up call:  Left voice message   [facility]  [facility] Population Health Community Resource Care Guide   [email or web address]  ?? [phone]   Website: [email or web address]  [email or web address]

## 2023-04-05 ENCOUNTER — Emergency Department (HOSPITAL_BASED_OUTPATIENT_CLINIC_OR_DEPARTMENT_OTHER)
Admission: EM | Admit: 2023-04-05 | Discharge: 2023-04-05 | Disposition: A | Discharge status: Home / Self Care | Payer: Medicare PPO | Attending: Emergency Medicine | Admitting: Emergency Medicine

## 2023-04-05 ENCOUNTER — Other Ambulatory Visit: Payer: Self-pay

## 2023-04-05 ENCOUNTER — Encounter (HOSPITAL_BASED_OUTPATIENT_CLINIC_OR_DEPARTMENT_OTHER): Payer: Self-pay | Admitting: Pediatrics

## 2023-04-05 DIAGNOSIS — R110 Nausea: Secondary | ICD-10-CM | POA: Insufficient documentation

## 2023-04-05 DIAGNOSIS — N189 Chronic kidney disease, unspecified: Secondary | ICD-10-CM | POA: Diagnosis not present

## 2023-04-05 DIAGNOSIS — E039 Hypothyroidism, unspecified: Secondary | ICD-10-CM | POA: Diagnosis not present

## 2023-04-05 DIAGNOSIS — M549 Dorsalgia, unspecified: Secondary | ICD-10-CM | POA: Diagnosis not present

## 2023-04-05 DIAGNOSIS — R52 Pain, unspecified: Secondary | ICD-10-CM

## 2023-04-05 DIAGNOSIS — G8929 Other chronic pain: Secondary | ICD-10-CM | POA: Diagnosis not present

## 2023-04-05 DIAGNOSIS — E201 Pseudohypoparathyroidism: Secondary | ICD-10-CM

## 2023-04-05 DIAGNOSIS — E876 Hypokalemia: Secondary | ICD-10-CM | POA: Insufficient documentation

## 2023-04-05 DIAGNOSIS — Z85118 Personal history of other malignant neoplasm of bronchus and lung: Secondary | ICD-10-CM | POA: Insufficient documentation

## 2023-04-05 DIAGNOSIS — Z79899 Other long term (current) drug therapy: Secondary | ICD-10-CM | POA: Insufficient documentation

## 2023-04-05 DIAGNOSIS — R0781 Pleurodynia: Secondary | ICD-10-CM | POA: Diagnosis not present

## 2023-04-05 LAB — CBC WITH DIFFERENTIAL/PLATELET
Abs Immature Granulocytes: 0.05 10*3/uL (ref 0.00–0.07)
Basophils Absolute: 0.1 10*3/uL (ref 0.0–0.1)
Basophils Relative: 1 %
Eosinophils Absolute: 0.1 10*3/uL (ref 0.0–0.5)
Eosinophils Relative: 1 %
HCT: 37.9 % (ref 36.0–46.0)
Hemoglobin: 13 g/dL (ref 12.0–15.0)
Immature Granulocytes: 1 %
Lymphocytes Relative: 25 %
Lymphs Abs: 1.6 10*3/uL (ref 0.7–4.0)
MCH: 30.4 pg (ref 26.0–34.0)
MCHC: 34.3 g/dL (ref 30.0–36.0)
MCV: 88.8 fL (ref 80.0–100.0)
Monocytes Absolute: 0.4 10*3/uL (ref 0.1–1.0)
Monocytes Relative: 7 %
Neutro Abs: 4.1 10*3/uL (ref 1.7–7.7)
Neutrophils Relative %: 65 %
Platelets: 181 10*3/uL (ref 150–400)
RBC: 4.27 MIL/uL (ref 3.87–5.11)
RDW: 14.2 % (ref 11.5–15.5)
WBC: 6.2 10*3/uL (ref 4.0–10.5)
nRBC: 0 % (ref 0.0–0.2)

## 2023-04-05 LAB — COMPREHENSIVE METABOLIC PANEL
ALT: 7 U/L (ref 0–44)
AST: 26 U/L (ref 15–41)
Albumin: 3.2 g/dL — ABNORMAL LOW (ref 3.5–5.0)
Alkaline Phosphatase: 58 U/L (ref 38–126)
Anion gap: 9 (ref 5–15)
BUN: 34 mg/dL — ABNORMAL HIGH (ref 8–23)
CO2: 17 mmol/L — ABNORMAL LOW (ref 22–32)
Calcium: 7.5 mg/dL — ABNORMAL LOW (ref 8.9–10.3)
Chloride: 109 mmol/L (ref 98–111)
Creatinine, Ser: 1.36 mg/dL — ABNORMAL HIGH (ref 0.44–1.00)
GFR, Estimated: 41 mL/min — ABNORMAL LOW (ref >60)
Glucose, Bld: 110 mg/dL — ABNORMAL HIGH (ref 70–99)
Potassium: 3 mmol/L — ABNORMAL LOW (ref 3.5–5.1)
Sodium: 135 mmol/L (ref 135–145)
Total Bilirubin: 0.4 mg/dL (ref 0.3–1.2)
Total Protein: 6.3 g/dL — ABNORMAL LOW (ref 6.5–8.1)

## 2023-04-05 LAB — MAGNESIUM: Magnesium: 2.3 mg/dL (ref 1.7–2.4)

## 2023-04-05 LAB — LIPASE, BLOOD: Lipase: 46 U/L (ref 11–51)

## 2023-04-05 MED ORDER — OXYCODONE HCL 5 MG PO TABS: 2.5000 mg | ORAL_TABLET | ORAL | 0 refills | Status: DC | PRN

## 2023-04-05 MED ORDER — FENTANYL CITRATE PF 50 MCG/ML IJ SOSY
25.0000 ug | PREFILLED_SYRINGE | Freq: Once | INTRAMUSCULAR | Status: AC
  Administered 2023-04-05: 25 ug via INTRAVENOUS
  Filled 2023-04-05: qty 1

## 2023-04-05 MED ORDER — HEPARIN SOD (PORK) LOCK FLUSH 100 UNIT/ML IV SOLN
500.0000 [IU] | Freq: Once | INTRAVENOUS | Status: AC
  Administered 2023-04-05: 500 [IU]
  Filled 2023-04-05: qty 5

## 2023-04-05 MED ORDER — HALOPERIDOL LACTATE 5 MG/ML IJ SOLN
1.0000 mg | Freq: Once | INTRAMUSCULAR | Status: AC
  Administered 2023-04-05: 1 mg via INTRAVENOUS
  Filled 2023-04-05: qty 1

## 2023-04-05 MED ORDER — ONDANSETRON 4 MG PO TBDP
4.0000 mg | ORAL_TABLET | Freq: Once | ORAL | Status: AC
  Administered 2023-04-05: 4 mg via ORAL
  Filled 2023-04-05: qty 1

## 2023-04-05 MED ORDER — OXYCODONE HCL 5 MG PO TABS
5.0000 mg | ORAL_TABLET | Freq: Once | ORAL | Status: DC
  Filled 2023-04-05: qty 1

## 2023-04-05 MED ORDER — PROCHLORPERAZINE MALEATE 10 MG PO TABS: 10.0000 mg | ORAL_TABLET | Freq: Two times a day (BID) | ORAL | 0 refills | Status: DC | PRN

## 2023-04-05 MED ORDER — FENTANYL CITRATE PF 50 MCG/ML IJ SOSY
50.0000 ug | PREFILLED_SYRINGE | Freq: Once | INTRAMUSCULAR | Status: AC
  Administered 2023-04-05: 50 ug via INTRAVENOUS
  Filled 2023-04-05: qty 1

## 2023-04-05 NOTE — Discharge Instructions (Addendum)
Continue frequent small sips (10-20 ml) of clear liquids every 5-10 minutes. Gatorade or powerade are good options. Avoid milk, orange juice, and grape juice for now. . Once you have not had further vomiting with the small sips for 4 hours, you may begin to drink larger volumes of fluids at a time and try a bland diet which may include saltine crackers, applesauce, breads, pastas, bananas, bland chicken. If you continues to vomit despite medication, return to the ED for repeat evaluation. Otherwise, follow up with your doctor in 2-3 days for a re-check.  

## 2023-04-05 NOTE — ED Provider Notes (Signed)
[place] [facility] Provider Note   CSN: [id number] Arrival date & time: 04/05/23  7829     History {Add pertinent medical, surgical, social history, OB history to HPI:1} Chief Complaint  Patient presents with   Nausea    Megan Brewer is a 77 y.o. female who presents emergency department with a chief complaint of severe pain.  Patient apparently is nauseated but did not mention this once during the HPI however did tell this to the triage nurse. Patient reports that she fell and was seen at [facility] long where they "did nothing, it was a mistake."  Review of EMR shows that the patient had a CT head, CT C-spine, and CT chest abdomen and pelvis with contrast that were all negative for acute findings.  Patient reports that she was given oxycodone but now she is out and her pain is severe.  She states she cannot sleep.  She complains of pain in every joint like she is being stabbed.  She also complains of pain in her ribs and her back.  She denies shortness of breath but states she cannot sleep.  She has a history of polymyalgia rheumatica and hypogammaglobulinemia.  He is on 1000 mg infusions of steroid every 3 to 6 weeks.  HPI     Home Medications Prior to Admission medications   Medication Sig Start Date End Date Taking? Authorizing Provider  acetaminophen (TYLENOL) 500 MG tablet Take 1,000 mg by mouth as needed for moderate pain.    [provider]  azelastine (ASTELIN) 0.1 % nasal spray Place 2 sprays into both nostrils 2 (two) times daily. Patient not taking: Reported on 03/31/2023 03/10/21   Hetty Blend, FNP  baclofen (LIORESAL) 10 MG tablet Take 10 mg by mouth daily as needed for muscle spasms. 06/03/20   [provider]  CALCIUM PO Take 6 tablets by mouth daily.    [provider]  cholecalciferol (VITAMIN D3) 25 MCG (1000 UNIT) tablet Take 1,000 Units by mouth daily.    [provider]  cycloSPORINE  (RESTASIS) 0.05 % ophthalmic emulsion Place 1 drop into both eyes in the morning, at noon, in the evening, and at bedtime. Patient not taking: Reported on 03/31/2023    [provider]  denosumab (PROLIA) 60 MG/ML SOSY injection Pt to get at office.  Appt is 02/10/23 01/19/23   Sharlene Dory, DO  dexlansoprazole (DEXILANT) 60 MG capsule Take 1 capsule (60 mg total) by mouth daily with breakfast. Patient not taking: Reported on 03/31/2023 04/30/22   Esterwood, Amy S, PA-C  famotidine (PEPCID) 20 MG tablet Take 1 tablet (20 mg total) by mouth at bedtime. 04/30/22   Esterwood, Amy S, PA-C  ferrous sulfate 325 (65 FE) MG tablet Take 650 mg by mouth daily with breakfast.    [provider]  fludrocortisone (FLORINEF) 0.1 MG tablet Take 0.1 mg by mouth daily.    [provider]  GAMMAGARD 20 GM/200ML SOLN Inject 40 g into the vein every 21 ( twenty-one) days. INFUSE 40G INTRAVENOUSLY EVERY 3 WEEKS 02/27/22   [provider]  heparin lock flush 100 UNIT/ML SOLN injection Inject 500 Units into the vein every 21 ( twenty-one) days. 03/31/21   [provider]  ibuprofen (ADVIL) 200 MG tablet Take 600 mg by mouth as needed for mild pain.    [provider]  KEVZARA 200 MG/1. SOAJ Inject 1.14 mLs into the skin every 14 (fourteen) days. 09/09/22  [provider]  levothyroxine (SYNTHROID) 75 MCG tablet TAKE ONE (1) TABLET BY MOUTH EACH DAY BEFORE BREAKFAST ON EMPTY STOMACH Patient taking differently: Take 75 mcg by mouth daily before breakfast. 30 MINUTES BEFORE BREAKFAST ON EMPTY STOMACH. 09/11/22   Sharlene Dory, DO  magnesium oxide (MAG-OX) 400 MG tablet Take 400 mg by mouth daily.     [provider]  metroNIDAZOLE (FLAGYL) 500 MG tablet Take 1 tablet (500 mg total) by mouth 2 (two) times daily. 02/18/23   Alvira Monday, MD  midodrine (PROAMATINE) 10 MG tablet Take 1 tablet (10 mg total) by mouth 3 (three) times  daily. 02/24/23   Wendling, Jilda Roche, DO  MYRBETRIQ 50 MG TB24 tablet Take 50 mg by mouth daily. 10/07/22   [provider]  ondansetron (ZOFRAN) 8 MG tablet TAKE ONE TABLET BY MOUTH EVERY EIGHT HOURS AS NEEDED FOR NAUSEA OR VOMITING 02/15/23   Wendling, Jilda Roche, DO  oxyCODONE (ROXICODONE) 5 MG immediate release tablet Take 0.5-1 tablets (2.5-5 mg total) by mouth every 4 (four) hours as needed for severe pain. Patient not taking: Reported on 03/31/2023 03/24/23   Glyn Ade, MD  potassium chloride (KLOR-CON) 10 MEQ tablet Take 2 tablets (20 mEq total) by mouth 2 (two) times daily. Patient not taking: Reported on 03/31/2023 12/29/22   Sharlene Dory, DO  potassium chloride SA (KLOR-CON M) 20 MEQ tablet Take 20 mEq by mouth 6 (six) times daily.    [provider]  pregabalin (LYRICA) 50 MG capsule Take 50 mg by mouth 3 (three) times daily. 02/23/23   [provider]  pyridoxine (B-6) 100 MG tablet Take 100 mg by mouth daily.    Sharlene Dory, DO  rOPINIRole (REQUIP) 0.5 MG tablet Take 0.5 mg by mouth 3 (three) times daily.    Sharlene Dory, DO  sucralfate (CARAFATE) 1 GM/10ML suspension TAKE 10 MLS (1 G TOTAL) BY MOUTH 4 TIMESDAILY AS NEEDED 01/25/23   Sharlene Dory, DO  tamsulosin (FLOMAX) 0.4 MG CAPS capsule Take 1 capsule (0.4 mg total) by mouth daily. 01/23/21   Burnadette Pop, MD  torsemide 40 MG TABS Take 20 mg by mouth 2 (two) times daily. 03/31/23   Carlos Levering, NP  triamcinolone cream (KENALOG) 0.1 % Apply 1 application  topically 2 (two) times daily as needed (rash).    [provider]  vitamin B-12 (CYANOCOBALAMIN) 1000 MCG tablet Take 1,000 mcg by mouth daily. 02/14/21   [provider]  Vitamin D, Ergocalciferol, (DRISDOL) 1.25 MG (50000 UNIT) CAPS capsule TAKE 1 CAPSULE ONCE WEEKLY 01/04/23   [name], Drema Pry, MD  ZINC OXIDE, TOPICAL, 10 % CREA Apply 1 application topically 2 (two) times daily  as needed (rash). 02/14/21   Glade Lloyd, MD      Allergies    Butorphanol, Erythromycin base, Pregabalin, Pregabalin er, Propoxyphene, Sumatriptan, Tetracycline, Ciprofloxacin, Corticosteroids, Doxycycline, Cefixime, Gabapentin, Isometheptene-dichloral-apap, Ketorolac, Prednisone, Promethazine, and Tape    Review of Systems   Review of Systems  Physical Exam Updated Vital Signs BP 95/70 (BP Location: Left Arm)   Pulse 86   Temp 98.1 F (36.7 C)   Resp 17   Ht 5\' 4"  (1.626 m)   Wt 67.9 kg   SpO2 99%   BMI 25.68 kg/m  Physical Exam Vitals and nursing note reviewed.  Constitutional:      General: She is not in acute distress.    Appearance: She is well-developed. She is not diaphoretic.  HENT:  Head: Normocephalic and atraumatic.     Comments: Speaking through clenched teeth    Right Ear: External ear normal.     Left Ear: External ear normal.     Nose: Nose normal.     Mouth/Throat:     Mouth: Mucous membranes are moist.  Eyes:     General: No scleral icterus.    Extraocular Movements: Extraocular movements intact.     Conjunctiva/sclera: Conjunctivae normal.     Pupils: Pupils are equal, round, and reactive to light.  Cardiovascular:     Rate and Rhythm: Normal rate and regular rhythm.     Heart sounds: Normal heart sounds. No murmur heard.    No friction rub. No gallop.  Pulmonary:     Effort: Pulmonary effort is normal. No respiratory distress.     Breath sounds: Normal breath sounds.  Chest:     Comments: Tender to palpation over the entire chest and back.  No obvious bruising or deformity, no crepitus, no step-off Abdominal:     General: Bowel sounds are normal. There is no distension.     Palpations: Abdomen is soft. There is no mass.     Tenderness: There is no abdominal tenderness. There is no guarding.  Musculoskeletal:     Cervical back: Normal range of motion.     Comments: Bruising and skin tears noted on bilateral upper extremities.  Appears  well-healing, bruising is old  Skin:    General: Skin is warm and dry.  Neurological:     Mental Status: She is alert and oriented to person, place, and time.  Psychiatric:        Behavior: Behavior normal.     ED Results / Procedures / Treatments   Labs (all labs ordered are listed, but only abnormal results are displayed) Labs Reviewed  CBC WITH DIFFERENTIAL/PLATELET  COMPREHENSIVE METABOLIC PANEL    EKG None  Radiology No results found.  Procedures Procedures  {Document cardiac monitor, telemetry assessment procedure when appropriate:1}  Medications Ordered in ED Medications  oxyCODONE (Oxy IR/ROXICODONE) immediate release tablet 5 mg (has no administration in time range)    ED Course/ Medical Decision Making/ A&P Clinical Course as of 04/05/23 2148  Mon Apr 05, 2023  1356 Lipase, blood [AH]  1356 Comprehensive metabolic panel(!) [AH]  1356 CBC with Differential [AH]    Clinical Course User Index [AH] Arthor Captain, PA-C   {   Click here for ABCD2, HEART and other calculatorsREFRESH Note before signing :1}                              Medical Decision Making Amount and/or Complexity of Data Reviewed Labs: ordered. Decision-making details documented in ED Course.  Risk Prescription drug management.   ***  {Document critical care time when appropriate:1} {Document review of labs and clinical decision tools ie heart score, Chads2Vasc2 etc:1}  {Document your independent review of radiology images, and any outside records:1} {Document your discussion with family members, caretakers, and with consultants:1} {Document social determinants of health affecting pt's care:1} {Document your decision making why or why not admission, treatments were needed:1} Final Clinical Impression(s) / ED Diagnoses Final diagnoses:  None    Rx / DC Orders ED Discharge Orders     None

## 2023-04-05 NOTE — ED Triage Notes (Signed)
C/O ongoing nausea, joint aches and pain along with pain on scrapes and bruises sustained from prior falls.

## 2023-04-06 ENCOUNTER — Encounter: Payer: Self-pay | Admitting: Family Medicine

## 2023-04-06 ENCOUNTER — Telehealth: Payer: Self-pay

## 2023-04-06 NOTE — Telephone Encounter (Signed)
Called pt regarding ER and mychart message.Pt having  Some right side rib pain and appt scheduled for tomorrow.

## 2023-04-07 ENCOUNTER — Ambulatory Visit: Payer: Medicare PPO | Admitting: Family Medicine

## 2023-04-07 ENCOUNTER — Encounter: Payer: Self-pay | Admitting: Family Medicine

## 2023-04-07 VITALS — BP 108/68 | HR 64 | Temp 98.0°F | Ht 64.0 in | Wt 139.0 lb

## 2023-04-07 DIAGNOSIS — M549 Dorsalgia, unspecified: Secondary | ICD-10-CM

## 2023-04-07 DIAGNOSIS — E039 Hypothyroidism, unspecified: Secondary | ICD-10-CM | POA: Diagnosis not present

## 2023-04-07 MED ORDER — OXYCODONE HCL 5 MG PO TABS: 2.5000 mg | ORAL_TABLET | ORAL | 0 refills | Status: DC | PRN

## 2023-04-07 MED ORDER — LEVOTHYROXINE SODIUM 75 MCG PO TABS
ORAL_TABLET | ORAL | 1 refills | Status: DC
Start: 2023-04-07 — End: 2023-07-19

## 2023-04-07 MED ORDER — PREGABALIN 50 MG PO CAPS: 50.0000 mg | ORAL_CAPSULE | Freq: Three times a day (TID) | ORAL | 1 refills | Status: DC

## 2023-04-07 NOTE — Patient Instructions (Addendum)
Take a balloon and blow it up every hour while awake. This will help keep your lungs open and prevent pneumonia.  Ice/cold pack over area for 10-15 min twice daily.  OK to take Tylenol 1000 mg (2 extra strength tabs) or 975 mg (3 regular strength tabs) every 6 hours as needed.  Heat (pad or rice pillow in microwave) over affected area, 10-15 minutes twice daily.   If you do not hear anything about your referral in the next 1-2 weeks, call our office and ask for an update.  Let us know if you need anything.  Mid-Back Strain Rehab It is normal to feel mild stretching, pulling, tightness, or discomfort as you do these exercises, but you should stop right away if you feel sudden pain or your pain gets worse.  Stretching and range of motion exercises This exercise warms up your muscles and joints and improves the movement and flexibility of your back and shoulders. This exercise also help to relieve pain. Exercise A: Chest and spine stretch  Lie down on your back on a firm surface. Roll a towel or a small blanket so it is about 4 inches (10 cm) in diameter. Put the towel lengthwise under the middle of your back so it is under your spine, but not under your shoulder blades. To increase the stretch, you may put your hands behind your head and let your elbows fall to your sides. Hold for 30 seconds. Repeat exercise 2 times. Complete this exercise 3 times per week. Strengthening exercises These exercises build strength and endurance in your back and your shoulder blade muscles. Endurance is the ability to use your muscles for a long time, even after they get tired. Exercise C: Straight arm rows (shoulder extension) Stand with your feet shoulder width apart. Secure an exercise band to a stable object in front of you so the band is at or above shoulder height. Hold one end of the exercise band in each hand. Straighten your elbows and lift your hands up to shoulder height. Step back, away from the  secured end of the exercise band, until the band stretches. Squeeze your shoulder blades together and pull your hands down to the sides of your thighs. Stop when your hands are straight down by your sides. Do not let your hands go behind your body. Hold for 2 seconds. Slowly return to the starting position. Repeat 2 times. Complete this exercise 3 times per week. Exercise D: Shoulder external rotation, prone Lie on your abdomen on a firm bed so your left / right forearm hangs over the edge of the bed and your upper arm is on the bed, straight out from your body. Your elbow should be bent. Your palm should be facing your feet. If instructed, hold a 2-5 lb weight in your hand. Squeeze your shoulder blade toward the middle of your back. Do not let your shoulder lift toward your ear. Keep your elbow bent in an "L" shape (90 degrees) while you slowly move your forearm up toward the ceiling. Move your forearm up to the height of the bed, toward your head. Your upper arm should not move. At the top of the movement, your palm should face the floor. Hold for 1 second. Slowly return to the starting position and relax your muscles. Repeat 2 times. Complete this exercise 3 times per week. Exercise E: Scapular retraction and external rotation, rowing  Sit in a stable chair without armrests, or stand. Secure an exercise band to a  stable object in front of you so it is at shoulder height. Hold one end of the exercise band in each hand. Bring your arms out straight in front of you. Step back, away from the secured end of the exercise band, until the band stretches. Pull the band backward. As you do this, bend your elbows and squeeze your shoulder blades together, but avoid letting the rest of your body move. Do not let your shoulders lift up toward your ears. Stop when your elbows are at your sides or slightly behind your body. Hold for 1 second1. Slowly straighten your arms to return to the starting  position. Repeat 2 times. Complete this exercise 3 times per week. Posture and body mechanics  Body mechanics refers to the movements and positions of your body while you do your daily activities. Posture is part of body mechanics. Good posture and healthy body mechanics can help to relieve stress in your body's tissues and joints. Good posture means that your spine is in its natural S-curve position (your spine is neutral), your shoulders are pulled back slightly, and your head is not tipped forward. The following are general guidelines for applying improved posture and body mechanics to your everyday activities. Standing  When standing, keep your spine neutral and your feet about hip-width apart. Keep a slight bend in your knees. Your ears, shoulders, and hips should line up. When you do a task in which you lean forward while standing in one place for a long time, place one foot up on a stable object that is 2-4 inches (5-10 cm) high, such as a footstool. This helps keep your spine neutral. Sitting  When sitting, keep your spine neutral and keep your feet flat on the floor. Use a footrest, if necessary, and keep your thighs parallel to the floor. Avoid rounding your shoulders, and avoid tilting your head forward. When working at a desk or a computer, keep your desk at a height where your hands are slightly lower than your elbows. Slide your chair under your desk so you are close enough to maintain good posture. When working at a computer, place your monitor at a height where you are looking straight ahead and you do not have to tilt your head forward or downward to look at the screen. Resting  When lying down and resting, avoid positions that are most painful for you. If you have pain with activities such as sitting, bending, stooping, or squatting (flexion-based activities), lie in a position in which your body does not bend very much. For example, avoid curling up on your side with your arms and  knees near your chest (fetal position). If you have pain with activities such as standing for a long time or reaching with your arms (extension-based activities), lie with your spine in a neutral position and bend your knees slightly. Try the following positions: Lying on your side with a pillow between your knees. Lying on your back with a pillow under your knees.  Lifting  When lifting objects, keep your feet at least shoulder-width apart and tighten your abdominal muscles. Bend your knees and hips and keep your spine neutral. It is important to lift using the strength of your legs, not your back. Do not lock your knees straight out. Always ask for help to lift heavy or awkward objects. Make sure you discuss any questions you have with your health care provider. Document Released: 08/03/2005 Document Revised: 04/09/2016 Document Reviewed: 05/15/2015 Elsevier Interactive Patient Education  2018  [employer].

## 2023-04-07 NOTE — Progress Notes (Signed)
Musculoskeletal Exam  Patient: Megan Brewer DOB: 21-Oct-1945  DOS: 04/07/2023  SUBJECTIVE:  Chief Complaint:   Chief Complaint  Patient presents with   Hospitalization Follow-up    Need refill of Lyrica and Levothyroxine (both were spilled)    Judeth Brewer is a 77 y.o.  female for evaluation and treatment of rib pain.   Onset:  2 weeks ago. No inj or change in activity.  Location: R rib cage Character:  aching  Progression of issue:  waxing and waning Associated symptoms: pain w deep inspiration No bruising, swelling, redness, coughing, fevers, wheezing, sob.  Treatment: to date has been ice and narcotics.   Neurovascular symptoms: no  Past Medical History:  Diagnosis Date   Anemia    Arachnoiditis    Bipolar disorder (HCC)    Cataract    removed   Chronic post-thoracotomy pain    CKD (chronic kidney disease)    GERD (gastroesophageal reflux disease)    Hypogammaglobulinemia (HCC)    Hypotension    Hypothyroid    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HCC)    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HCC)    Lung cancer (HCC) 2011, 2014   Memory loss    Migraine    Opioid abuse (HCC)    Osteoporosis    PMR (polymyalgia rheumatica) (HCC) 03/17/2022   Presence of neurostimulator    Restless legs    Sleep apnea     Objective: VITAL SIGNS: BP 108/68 (BP Location: Left Arm, Patient Position: Sitting, Cuff Size: Normal)   Pulse 64   Temp 98 F (36.7 C) (Oral)   Ht 5\' 4"  (1.626 m)   Wt 139 lb (63 kg)   SpO2 99%   BMI 23.86 kg/m  Constitutional: Well formed, well developed. No acute distress. Heart: RRR Thorax & Lungs: CTAB. No accessory muscle use Musculoskeletal: Lower rib cage.   Tenderness to palpation: yes over the CVA in thor region Deformity: no Ecchymosis: no Skin changes: no Neurologic: Normal sensory function. No focal deficits noted. DTR's equal and symmetric in LE's. No clonus. Psychiatric: Normal mood. Age appropriate judgment and insight. Alert  & oriented x 3.    Assessment:  Acute mid back pain - Plan: Ambulatory referral to Home Health  Acquired hypothyroidism - Plan: levothyroxine (SYNTHROID) 75 MCG tablet  Plan: Stretches/exercises, heat, ice, Tylenol. Warnings about oxycodone verbalized. Blow up a balloon once per hr while awake. Will set up home physical therapy and a home health aide as her husband is going through back issues and may require surgery.  They do not have an extensive support network in this area. F/u as originally scheduled. The patient voiced understanding and agreement to the plan.  I spent 30 minutes with the patient discussing the above plan in addition to reviewing her chart/ER visit on the same day of the visit.   Jilda Roche [place], DO 04/07/23  2:36 PM

## 2023-04-08 DIAGNOSIS — M542 Cervicalgia: Secondary | ICD-10-CM | POA: Diagnosis not present

## 2023-04-08 DIAGNOSIS — M62838 Other muscle spasm: Secondary | ICD-10-CM | POA: Diagnosis not present

## 2023-04-10 DIAGNOSIS — M353 Polymyalgia rheumatica: Secondary | ICD-10-CM | POA: Diagnosis not present

## 2023-04-10 DIAGNOSIS — I951 Orthostatic hypotension: Secondary | ICD-10-CM | POA: Diagnosis not present

## 2023-04-10 DIAGNOSIS — D631 Anemia in chronic kidney disease: Secondary | ICD-10-CM | POA: Diagnosis not present

## 2023-04-10 DIAGNOSIS — N1831 Chronic kidney disease, stage 3a: Secondary | ICD-10-CM | POA: Diagnosis not present

## 2023-04-10 DIAGNOSIS — G4733 Obstructive sleep apnea (adult) (pediatric): Secondary | ICD-10-CM | POA: Diagnosis not present

## 2023-04-10 DIAGNOSIS — E039 Hypothyroidism, unspecified: Secondary | ICD-10-CM | POA: Diagnosis not present

## 2023-04-10 DIAGNOSIS — I340 Nonrheumatic mitral (valve) insufficiency: Secondary | ICD-10-CM | POA: Diagnosis not present

## 2023-04-10 DIAGNOSIS — F319 Bipolar disorder, unspecified: Secondary | ICD-10-CM | POA: Diagnosis not present

## 2023-04-10 DIAGNOSIS — I447 Left bundle-branch block, unspecified: Secondary | ICD-10-CM | POA: Diagnosis not present

## 2023-04-12 DIAGNOSIS — D839 Common variable immunodeficiency, unspecified: Secondary | ICD-10-CM | POA: Diagnosis not present

## 2023-04-13 ENCOUNTER — Telehealth: Payer: Self-pay | Admitting: Family Medicine

## 2023-04-13 DIAGNOSIS — M353 Polymyalgia rheumatica: Secondary | ICD-10-CM | POA: Diagnosis not present

## 2023-04-13 DIAGNOSIS — F319 Bipolar disorder, unspecified: Secondary | ICD-10-CM | POA: Diagnosis not present

## 2023-04-13 DIAGNOSIS — N1831 Chronic kidney disease, stage 3a: Secondary | ICD-10-CM | POA: Diagnosis not present

## 2023-04-13 DIAGNOSIS — I447 Left bundle-branch block, unspecified: Secondary | ICD-10-CM | POA: Diagnosis not present

## 2023-04-13 DIAGNOSIS — D631 Anemia in chronic kidney disease: Secondary | ICD-10-CM | POA: Diagnosis not present

## 2023-04-13 DIAGNOSIS — I951 Orthostatic hypotension: Secondary | ICD-10-CM | POA: Diagnosis not present

## 2023-04-13 DIAGNOSIS — E039 Hypothyroidism, unspecified: Secondary | ICD-10-CM | POA: Diagnosis not present

## 2023-04-13 DIAGNOSIS — G4733 Obstructive sleep apnea (adult) (pediatric): Secondary | ICD-10-CM | POA: Diagnosis not present

## 2023-04-13 DIAGNOSIS — I340 Nonrheumatic mitral (valve) insufficiency: Secondary | ICD-10-CM | POA: Diagnosis not present

## 2023-04-13 NOTE — Telephone Encounter (Signed)
Megan Brewer from [facility] called & requested to receive a verbal order to approve the pt to participate in physical therapy & skilled nursing. Please call & advise at [phone]. The voicemail is secured.

## 2023-04-13 NOTE — Telephone Encounter (Signed)
Called [facility] informed ok per PCP

## 2023-04-14 DIAGNOSIS — Z9889 Other specified postprocedural states: Secondary | ICD-10-CM | POA: Diagnosis not present

## 2023-04-14 DIAGNOSIS — J309 Allergic rhinitis, unspecified: Secondary | ICD-10-CM | POA: Diagnosis not present

## 2023-04-14 DIAGNOSIS — J3489 Other specified disorders of nose and nasal sinuses: Secondary | ICD-10-CM | POA: Diagnosis not present

## 2023-04-15 ENCOUNTER — Telehealth: Payer: Self-pay | Admitting: Family Medicine

## 2023-04-15 DIAGNOSIS — N1831 Chronic kidney disease, stage 3a: Secondary | ICD-10-CM | POA: Diagnosis not present

## 2023-04-15 DIAGNOSIS — D631 Anemia in chronic kidney disease: Secondary | ICD-10-CM | POA: Diagnosis not present

## 2023-04-15 DIAGNOSIS — E039 Hypothyroidism, unspecified: Secondary | ICD-10-CM | POA: Diagnosis not present

## 2023-04-15 DIAGNOSIS — I340 Nonrheumatic mitral (valve) insufficiency: Secondary | ICD-10-CM | POA: Diagnosis not present

## 2023-04-15 DIAGNOSIS — G4733 Obstructive sleep apnea (adult) (pediatric): Secondary | ICD-10-CM | POA: Diagnosis not present

## 2023-04-15 DIAGNOSIS — I951 Orthostatic hypotension: Secondary | ICD-10-CM | POA: Diagnosis not present

## 2023-04-15 DIAGNOSIS — M353 Polymyalgia rheumatica: Secondary | ICD-10-CM | POA: Diagnosis not present

## 2023-04-15 DIAGNOSIS — F319 Bipolar disorder, unspecified: Secondary | ICD-10-CM | POA: Diagnosis not present

## 2023-04-15 DIAGNOSIS — I447 Left bundle-branch block, unspecified: Secondary | ICD-10-CM | POA: Diagnosis not present

## 2023-04-15 NOTE — Telephone Encounter (Signed)
Pt requested call back from Robin regarding Potassium prescription. Please call [phone]

## 2023-04-15 NOTE — Telephone Encounter (Signed)
Caller/Agency: Barron Schmid [facility]  Callback Number: [phone] Requesting OT/PT/Skilled Nursing/Social Work/Speech Therapy: Nursing  Frequency: 2x2, Rogelia Boga also reported that patient's BP was low (102/52) and she was feeling lightheaded when she stands up.

## 2023-04-15 NOTE — Telephone Encounter (Signed)
OK for [facility] BP better than usual.

## 2023-04-16 NOTE — Addendum Note (Signed)
Addended by: Scharlene Gloss B on: 04/16/2023 09:12 AM   Modules accepted: Orders

## 2023-04-16 NOTE — Telephone Encounter (Signed)
Called [facility] left detailed message of PCP verbal ok

## 2023-04-16 NOTE — Telephone Encounter (Signed)
Clarified potassium per PCP instructions Potassium 10 mg take 2 twice daily. Clarified with her husband

## 2023-04-20 ENCOUNTER — Telehealth: Payer: Self-pay | Admitting: Family Medicine

## 2023-04-20 ENCOUNTER — Other Ambulatory Visit: Payer: Self-pay | Admitting: Student

## 2023-04-20 DIAGNOSIS — I959 Hypotension, unspecified: Secondary | ICD-10-CM

## 2023-04-20 DIAGNOSIS — M353 Polymyalgia rheumatica: Secondary | ICD-10-CM | POA: Diagnosis not present

## 2023-04-20 DIAGNOSIS — I340 Nonrheumatic mitral (valve) insufficiency: Secondary | ICD-10-CM | POA: Diagnosis not present

## 2023-04-20 DIAGNOSIS — E039 Hypothyroidism, unspecified: Secondary | ICD-10-CM | POA: Diagnosis not present

## 2023-04-20 DIAGNOSIS — F319 Bipolar disorder, unspecified: Secondary | ICD-10-CM | POA: Diagnosis not present

## 2023-04-20 DIAGNOSIS — D631 Anemia in chronic kidney disease: Secondary | ICD-10-CM | POA: Diagnosis not present

## 2023-04-20 DIAGNOSIS — N1831 Chronic kidney disease, stage 3a: Secondary | ICD-10-CM | POA: Diagnosis not present

## 2023-04-20 DIAGNOSIS — G4733 Obstructive sleep apnea (adult) (pediatric): Secondary | ICD-10-CM | POA: Diagnosis not present

## 2023-04-20 DIAGNOSIS — I447 Left bundle-branch block, unspecified: Secondary | ICD-10-CM | POA: Diagnosis not present

## 2023-04-20 DIAGNOSIS — I951 Orthostatic hypotension: Secondary | ICD-10-CM | POA: Diagnosis not present

## 2023-04-20 NOTE — Telephone Encounter (Signed)
Caller/Agency: [facility]  Callback Number: [phone] Requesting OT/PT/Skilled Nursing/Social Work/Speech Therapy: continue OT effective today  Frequency: 1x8

## 2023-04-20 NOTE — Telephone Encounter (Signed)
Called [facility] left detailed message of PCP verbal ok

## 2023-04-22 DIAGNOSIS — F319 Bipolar disorder, unspecified: Secondary | ICD-10-CM | POA: Diagnosis not present

## 2023-04-22 DIAGNOSIS — E039 Hypothyroidism, unspecified: Secondary | ICD-10-CM | POA: Diagnosis not present

## 2023-04-22 DIAGNOSIS — G4733 Obstructive sleep apnea (adult) (pediatric): Secondary | ICD-10-CM | POA: Diagnosis not present

## 2023-04-22 DIAGNOSIS — M353 Polymyalgia rheumatica: Secondary | ICD-10-CM | POA: Diagnosis not present

## 2023-04-22 DIAGNOSIS — I951 Orthostatic hypotension: Secondary | ICD-10-CM | POA: Diagnosis not present

## 2023-04-22 DIAGNOSIS — I447 Left bundle-branch block, unspecified: Secondary | ICD-10-CM | POA: Diagnosis not present

## 2023-04-22 DIAGNOSIS — N1831 Chronic kidney disease, stage 3a: Secondary | ICD-10-CM | POA: Diagnosis not present

## 2023-04-22 DIAGNOSIS — I340 Nonrheumatic mitral (valve) insufficiency: Secondary | ICD-10-CM | POA: Diagnosis not present

## 2023-04-22 DIAGNOSIS — D631 Anemia in chronic kidney disease: Secondary | ICD-10-CM | POA: Diagnosis not present

## 2023-04-23 DIAGNOSIS — D631 Anemia in chronic kidney disease: Secondary | ICD-10-CM | POA: Diagnosis not present

## 2023-04-23 DIAGNOSIS — G4733 Obstructive sleep apnea (adult) (pediatric): Secondary | ICD-10-CM | POA: Diagnosis not present

## 2023-04-23 DIAGNOSIS — F319 Bipolar disorder, unspecified: Secondary | ICD-10-CM | POA: Diagnosis not present

## 2023-04-23 DIAGNOSIS — E039 Hypothyroidism, unspecified: Secondary | ICD-10-CM | POA: Diagnosis not present

## 2023-04-23 DIAGNOSIS — I340 Nonrheumatic mitral (valve) insufficiency: Secondary | ICD-10-CM | POA: Diagnosis not present

## 2023-04-23 DIAGNOSIS — N1831 Chronic kidney disease, stage 3a: Secondary | ICD-10-CM | POA: Diagnosis not present

## 2023-04-23 DIAGNOSIS — I447 Left bundle-branch block, unspecified: Secondary | ICD-10-CM | POA: Diagnosis not present

## 2023-04-23 DIAGNOSIS — I951 Orthostatic hypotension: Secondary | ICD-10-CM | POA: Diagnosis not present

## 2023-04-23 DIAGNOSIS — M353 Polymyalgia rheumatica: Secondary | ICD-10-CM | POA: Diagnosis not present

## 2023-04-24 ENCOUNTER — Other Ambulatory Visit: Payer: Self-pay | Admitting: Family Medicine

## 2023-04-24 DIAGNOSIS — R531 Weakness: Secondary | ICD-10-CM

## 2023-04-26 ENCOUNTER — Emergency Department (HOSPITAL_COMMUNITY): Payer: Medicare PPO

## 2023-04-26 ENCOUNTER — Other Ambulatory Visit: Payer: Self-pay

## 2023-04-26 ENCOUNTER — Encounter: Payer: Medicare PPO | Admitting: Physical Medicine and Rehabilitation

## 2023-04-26 ENCOUNTER — Encounter (HOSPITAL_COMMUNITY): Payer: Self-pay

## 2023-04-26 ENCOUNTER — Emergency Department (HOSPITAL_COMMUNITY)
Admission: EM | Admit: 2023-04-26 | Discharge: 2023-04-26 | Disposition: A | Discharge status: Home / Self Care | Payer: Medicare PPO | Attending: Emergency Medicine | Admitting: Emergency Medicine

## 2023-04-26 DIAGNOSIS — Z1152 Encounter for screening for COVID-19: Secondary | ICD-10-CM | POA: Insufficient documentation

## 2023-04-26 DIAGNOSIS — R079 Chest pain, unspecified: Secondary | ICD-10-CM

## 2023-04-26 DIAGNOSIS — R531 Weakness: Secondary | ICD-10-CM | POA: Diagnosis not present

## 2023-04-26 DIAGNOSIS — E039 Hypothyroidism, unspecified: Secondary | ICD-10-CM | POA: Diagnosis not present

## 2023-04-26 DIAGNOSIS — R0789 Other chest pain: Secondary | ICD-10-CM | POA: Diagnosis not present

## 2023-04-26 DIAGNOSIS — S2239XA Fracture of one rib, unspecified side, initial encounter for closed fracture: Secondary | ICD-10-CM | POA: Diagnosis not present

## 2023-04-26 DIAGNOSIS — Z85118 Personal history of other malignant neoplasm of bronchus and lung: Secondary | ICD-10-CM | POA: Insufficient documentation

## 2023-04-26 DIAGNOSIS — R509 Fever, unspecified: Secondary | ICD-10-CM | POA: Diagnosis not present

## 2023-04-26 LAB — CBC WITH DIFFERENTIAL/PLATELET
Abs Immature Granulocytes: 0.01 10*3/uL (ref 0.00–0.07)
Basophils Absolute: 0 10*3/uL (ref 0.0–0.1)
Basophils Relative: 1 %
Eosinophils Absolute: 0 10*3/uL (ref 0.0–0.5)
Eosinophils Relative: 1 %
HCT: 38.5 % (ref 36.0–46.0)
Hemoglobin: 12.5 g/dL (ref 12.0–15.0)
Immature Granulocytes: 0 %
Lymphocytes Relative: 42 %
Lymphs Abs: 1.4 10*3/uL (ref 0.7–4.0)
MCH: 30.3 pg (ref 26.0–34.0)
MCHC: 32.5 g/dL (ref 30.0–36.0)
MCV: 93.2 fL (ref 80.0–100.0)
Monocytes Absolute: 0.3 10*3/uL (ref 0.1–1.0)
Monocytes Relative: 9 %
Neutro Abs: 1.6 10*3/uL — ABNORMAL LOW (ref 1.7–7.7)
Neutrophils Relative %: 47 %
Platelets: 151 10*3/uL (ref 150–400)
RBC: 4.13 MIL/uL (ref 3.87–5.11)
RDW: 16.8 % — ABNORMAL HIGH (ref 11.5–15.5)
WBC: 3.4 10*3/uL — ABNORMAL LOW (ref 4.0–10.5)
nRBC: 0 % (ref 0.0–0.2)

## 2023-04-26 LAB — COMPREHENSIVE METABOLIC PANEL
ALT: 30 U/L (ref 0–44)
AST: 39 U/L (ref 15–41)
Albumin: 3.1 g/dL — ABNORMAL LOW (ref 3.5–5.0)
Alkaline Phosphatase: 56 U/L (ref 38–126)
Anion gap: 10 (ref 5–15)
BUN: 26 mg/dL — ABNORMAL HIGH (ref 8–23)
CO2: 26 mmol/L (ref 22–32)
Calcium: 8.8 mg/dL — ABNORMAL LOW (ref 8.9–10.3)
Chloride: 103 mmol/L (ref 98–111)
Creatinine, Ser: 1.66 mg/dL — ABNORMAL HIGH (ref 0.44–1.00)
GFR, Estimated: 32 mL/min — ABNORMAL LOW (ref >60)
Glucose, Bld: 117 mg/dL — ABNORMAL HIGH (ref 70–99)
Potassium: 3.6 mmol/L (ref 3.5–5.1)
Sodium: 139 mmol/L (ref 135–145)
Total Bilirubin: 0.5 mg/dL (ref 0.3–1.2)
Total Protein: 6.3 g/dL — ABNORMAL LOW (ref 6.5–8.1)

## 2023-04-26 LAB — TROPONIN I (HIGH SENSITIVITY)
Troponin I (High Sensitivity): 10 ng/L (ref <18)
Troponin I (High Sensitivity): 8 ng/L (ref <18)

## 2023-04-26 LAB — RESP PANEL BY RT-PCR (RSV, FLU A&B, COVID)  RVPGX2
Influenza A by PCR: NEGATIVE
Influenza B by PCR: NEGATIVE
Resp Syncytial Virus by PCR: NEGATIVE
SARS Coronavirus 2 by RT PCR: NEGATIVE

## 2023-04-26 LAB — LIPASE, BLOOD: Lipase: 67 U/L — ABNORMAL HIGH (ref 11–51)

## 2023-04-26 MED ORDER — SODIUM CHLORIDE 0.9 % IV BOLUS
1000.0000 mL | Freq: Once | INTRAVENOUS | Status: AC
  Administered 2023-04-26: 1000 mL via INTRAVENOUS

## 2023-04-26 MED ORDER — ACETAMINOPHEN 500 MG PO TABS
1000.0000 mg | ORAL_TABLET | Freq: Once | ORAL | Status: AC
  Administered 2023-04-26: 1000 mg via ORAL
  Filled 2023-04-26: qty 2

## 2023-04-26 MED ORDER — FENTANYL CITRATE PF 50 MCG/ML IJ SOSY
50.0000 ug | PREFILLED_SYRINGE | Freq: Once | INTRAMUSCULAR | Status: AC
  Administered 2023-04-26: 50 ug via INTRAVENOUS
  Filled 2023-04-26: qty 1

## 2023-04-26 NOTE — ED Provider Notes (Signed)
[place] EMERGENCY DEPARTMENT AT [facility] Provider Note   CSN: [id number] Arrival date & time: 04/26/23  4696     History  Chief Complaint  Patient presents with   Chest Pain    Megan Brewer is a 77 y.o. female.  Patient here with chest pain, body aches since this morning.  History of immunoglobulin deficiency, memory loss, hypothyroidism, bipolar, polymyalgia rheumatica.  Woke up this morning with some chest discomfort worse with movement.  Feels like Megan Brewer has bodyaches and flulike symptoms that is what Megan Brewer tells me the pain feels like.  Megan Brewer is not having any other chest pain prior to this.  Megan Brewer is wheelchair-bound.  Movement makes it worse.  Rest makes it better.  Megan Brewer denies any cough or sputum production.  No shortness of breath.  Megan Brewer has a history of orthostatic hypotension as well.  Megan Brewer is on Florinef and midodrine for that.  Megan Brewer took her morning doses.  Megan Brewer had a blood pressure 88 systolic with EMS but improved now.  The history is provided by the patient.       Home Medications Prior to Admission medications   Medication Sig Start Date End Date Taking? Authorizing Provider  acetaminophen (TYLENOL) 500 MG tablet Take 1,000 mg by mouth as needed for moderate pain.    [provider]  azelastine (ASTELIN) 0.1 % nasal spray Place 2 sprays into both nostrils 2 (two) times daily. 03/10/21   Hetty Blend, FNP  baclofen (LIORESAL) 10 MG tablet Take 10 mg by mouth daily as needed for muscle spasms. 06/03/20   [provider]  CALCIUM PO Take 6 tablets by mouth daily.    [provider]  cholecalciferol (VITAMIN D3) 25 MCG (1000 UNIT) tablet Take 1,000 Units by mouth daily.    [provider]  cycloSPORINE (RESTASIS) 0.05 % ophthalmic emulsion Place 1 drop into both eyes in the morning, at noon, in the evening, and at bedtime.    [provider]  denosumab (PROLIA) 60 MG/ML SOSY injection Pt to get at office.  Appt is  02/10/23 01/19/23   Sharlene Dory, DO  dexlansoprazole (DEXILANT) 60 MG capsule Take 1 capsule (60 mg total) by mouth daily with breakfast. 04/30/22   Esterwood, Amy S, PA-C  famotidine (PEPCID) 20 MG tablet Take 1 tablet (20 mg total) by mouth at bedtime. 04/30/22   Esterwood, Amy S, PA-C  ferrous sulfate 325 (65 FE) MG tablet Take 650 mg by mouth daily with breakfast.    [provider]  fludrocortisone (FLORINEF) 0.1 MG tablet Take 0.1 mg by mouth daily.    [provider]  GAMMAGARD 20 GM/200ML SOLN Inject 40 g into the vein every 21 ( twenty-one) days. INFUSE 40G INTRAVENOUSLY EVERY 3 WEEKS 02/27/22   [provider]  heparin lock flush 100 UNIT/ML SOLN injection Inject 500 Units into the vein every 21 ( twenty-one) days. 03/31/21   [provider]  ibuprofen (ADVIL) 200 MG tablet Take 600 mg by mouth as needed for mild pain.    [provider]  KEVZARA 200 MG/1. SOAJ Inject 1.14 mLs into the skin every 14 (fourteen) days. 09/09/22   [provider]  levothyroxine (SYNTHROID) 75 MCG tablet TAKE ONE (1) TABLET BY MOUTH EACH DAY BEFORE BREAKFAST ON EMPTY STOMACH 04/07/23   Sharlene Dory, DO  magnesium oxide (MAG-OX) 400 MG tablet Take 400 mg by mouth daily.     [provider]  midodrine (  PROAMATINE) 10 MG tablet Take 1 tablet (10 mg total) by mouth 3 (three) times daily. 02/24/23   Wendling, Jilda Roche, DO  MYRBETRIQ 50 MG TB24 tablet Take 50 mg by mouth daily. 10/07/22   [provider]  ondansetron (ZOFRAN) 8 MG tablet TAKE ONE TABLET BY MOUTH EVERY EIGHT HOURS AS NEEDED FOR NAUSEA OR VOMITING 02/15/23   Wendling, Jilda Roche, DO  oxyCODONE (ROXICODONE) 5 MG immediate release tablet Take 0.5-1 tablets (2.5-5 mg total) by mouth every 4 (four) hours as needed for severe pain. 04/07/23   Sharlene Dory, DO  potassium chloride (KLOR-CON) 10 MEQ tablet TAKE ONE (1) TABLET BY MOUTH FOUR (4) TIMES  DAILY 04/26/23   Wendling, Jilda Roche, DO  pregabalin (LYRICA) 50 MG capsule Take 1 capsule (50 mg total) by mouth 3 (three) times daily. 04/07/23   Sharlene Dory, DO  prochlorperazine (COMPAZINE) 10 MG tablet Take 1 tablet (10 mg total) by mouth 2 (two) times daily as needed for nausea or vomiting. 04/05/23   Arthor Captain, PA-C  pyridoxine (B-6) 100 MG tablet Take 100 mg by mouth daily.    Sharlene Dory, DO  rOPINIRole (REQUIP) 0.5 MG tablet Take 0.5 mg by mouth 3 (three) times daily.    Sharlene Dory, DO  sucralfate (CARAFATE) 1 GM/10ML suspension TAKE 10 MLS (1 G TOTAL) BY MOUTH 4 TIMESDAILY AS NEEDED 01/25/23   Sharlene Dory, DO  tamsulosin (FLOMAX) 0.4 MG CAPS capsule Take 1 capsule (0.4 mg total) by mouth daily. 01/23/21   Burnadette Pop, MD  torsemide 40 MG TABS Take 20 mg by mouth 2 (two) times daily. 03/31/23   Carlos Levering, NP  triamcinolone cream (KENALOG) 0.1 % Apply 1 application  topically 2 (two) times daily as needed (rash).    [provider]  vitamin B-12 (CYANOCOBALAMIN) 1000 MCG tablet Take 1,000 mcg by mouth daily. 02/14/21   [provider]  Vitamin D, Ergocalciferol, (DRISDOL) 1.25 MG (50000 UNIT) CAPS capsule TAKE 1 CAPSULE ONCE WEEKLY 01/04/23   [name], Drema Pry, MD  ZINC OXIDE, TOPICAL, 10 % CREA Apply 1 application topically 2 (two) times daily as needed (rash). 02/14/21   Glade Lloyd, MD      Allergies    Butorphanol, Erythromycin base, Pregabalin, Pregabalin er, Propoxyphene, Sumatriptan, Tetracycline, Ciprofloxacin, Corticosteroids, Doxycycline, Cefixime, Gabapentin, Isometheptene-dichloral-apap, Ketorolac, Prednisone, Promethazine, and Tape    Review of Systems   Review of Systems  Physical Exam Updated Vital Signs BP 103/62   Pulse 72   Temp (!) 96.8 F (36 C) (Axillary) Comment (Src): warm blankets applied  Resp 15   Ht 5\' 4"  (1.626 m)   Wt 64 kg   SpO2 100%   BMI 24.20 kg/m  Physical  Exam Vitals and nursing note reviewed.  Constitutional:      General: Megan Brewer is not in acute distress.    Appearance: Megan Brewer is well-developed.     Comments: Chronically ill-appearing  HENT:     Head: Normocephalic and atraumatic.  Eyes:     Extraocular Movements: Extraocular movements intact.     Conjunctiva/sclera: Conjunctivae normal.     Pupils: Pupils are equal, round, and reactive to light.  Cardiovascular:     Rate and Rhythm: Normal rate and regular rhythm.     Pulses:          Radial pulses are 2+ on the right side and 2+ on the left side.     Heart sounds: No murmur heard. Pulmonary:  Effort: Pulmonary effort is normal. No respiratory distress.     Breath sounds: Normal breath sounds. No decreased breath sounds, wheezing or rhonchi.  Chest:     Chest wall: Tenderness present.     Comments: Reproducible tenderness to the left side of the chest wall on exam Abdominal:     Palpations: Abdomen is soft.     Tenderness: There is no abdominal tenderness.  Musculoskeletal:        General: No swelling.     Cervical back: Normal range of motion and neck supple.  Skin:    General: Skin is warm and dry.     Capillary Refill: Capillary refill takes less than 2 seconds.  Neurological:     General: No focal deficit present.     Mental Status: Megan Brewer is alert.  Psychiatric:        Mood and Affect: Mood normal.     ED Results / Procedures / Treatments   Labs (all labs ordered are listed, but only abnormal results are displayed) Labs Reviewed  CBC WITH DIFFERENTIAL/PLATELET - Abnormal; Notable for the following components:      Result Value   WBC 3.4 (*)    RDW 16.8 (*)    Neutro Abs 1.6 (*)    All other components within normal limits  COMPREHENSIVE METABOLIC PANEL - Abnormal; Notable for the following components:   Glucose, Bld 117 (*)    BUN 26 (*)    Creatinine, Ser 1.66 (*)    Calcium 8.8 (*)    Total Protein 6.3 (*)    Albumin 3.1 (*)    GFR, Estimated 32 (*)    All  other components within normal limits  LIPASE, BLOOD - Abnormal; Notable for the following components:   Lipase 67 (*)    All other components within normal limits  RESP PANEL BY RT-PCR (RSV, FLU A&B, COVID)  [id number]  TROPONIN I (HIGH SENSITIVITY)  TROPONIN I (HIGH SENSITIVITY)    EKG EKG Interpretation Date/Time:  Monday April 26 2023 09:23:29 EDT Ventricular Rate:  75 PR Interval:  154 QRS Duration:  134 QT Interval:  433 QTC Calculation: 484 R Axis:   40  Text Interpretation: Sinus rhythm Left bundle branch block Confirmed by Virgina Norfolk [phone]) on 04/26/2023 10:23:32 AM  Radiology DG Chest Portable 1 View  Result Date: 04/26/2023 CLINICAL DATA:  Chest pain EXAM: PORTABLE CHEST 1 VIEW COMPARISON:  01/01/2023 FINDINGS: Evaluation is limited by overlying leads. Right chest port with tip projecting at the superior cavoatrial junction. Normal cardiac and mediastinal contours. No focal pulmonary opacity. No pleural effusion or pneumothorax. A sequela of prior right lower lung resection. Discontinuity of the posterior seventh rib at the site of the previously noted deformity, concerning for fracture, of indeterminate acuity but new since the 03/24/2023 CT. Redemonstrated spinal cord stimulator leads. IMPRESSION: 1. No acute cardiopulmonary process. 2. Fracture of the posterior seventh rib at the site of the previously noted deformity, of indeterminate acuity but new since the 04/13/2023 CT. Electronically Signed   By: Wiliam Ke M.D.   On: 04/26/2023 11:15    Procedures Procedures    Medications Ordered in ED Medications  acetaminophen (TYLENOL) tablet 1,000 mg (1,000 mg Oral Given 04/26/23 0957)  fentaNYL (SUBLIMAZE) injection 50 mcg (50 mcg Intravenous Given 04/26/23 0957)  sodium chloride 0.9 % bolus 1,000 mL (0 mLs Intravenous Stopped 04/26/23 1120)  fentaNYL (SUBLIMAZE) injection 50 mcg (50 mcg Intravenous Given 04/26/23 1130)    ED Course/ Medical Decision  Making/ A&P                                  Medical Decision Making Amount and/or Complexity of Data Reviewed Labs: ordered. Radiology: ordered.  Risk OTC drugs. Prescription drug management.   Megan Brewer is here with chest pain.  History of immunoglobulin deficiency, reflux, lung cancer, opioid abuse, hypertension/orthostatic hypotension, polymyalgia rheumatica.  Pain worse with movement and palpation.  Started this morning.  No episodes prior.  No falls.  No infectious symptoms.  Megan Brewer is wheelchair-bound lives at home with her husband.  Megan Brewer denies any cough or sputum production.  Overall Megan Brewer has is differential diagnosis likely costochondritis/MSK pain versus ACS versus infectious process.  I have very low suspicion for PE or dissection at this time.  Megan Brewer tells me that Megan Brewer feels like Megan Brewer has the flu/her body aches making her feel that way.  Could be a viral process.  Will check CBC, CMP, lipase, COVID, troponin, chest x-ray and give IV fluids IV fentanyl and Tylenol  Per my review and interpretation labs troponin negative x 2.  No significant anemia or electrolyte abnormality or kidney injury or leukocytosis.  COVID and flu test negative.  Chest x-ray per radiology report with no acute findings.  Overall I do think that this is musculoskeletal pain in the setting of recent rib fracture.  Recommend continued use of pain meds but Megan Brewer has narcotic pain meds at home.  Megan Brewer can follow-up with her primary care doctor.  No concern for PE other acute process.  Discharged in good condition.  This chart was dictated using voice recognition software.  Despite best efforts to proofread,  errors can occur which can change the documentation meaning.         Final Clinical Impression(s) / ED Diagnoses Final diagnoses:  Nonspecific chest pain    Rx / DC Orders ED Discharge Orders     None         Virgina Norfolk, DO 04/26/23 1332

## 2023-04-26 NOTE — ED Triage Notes (Addendum)
Pt arrives via EMS from home. Pt reports chest pain that started this morning around 0400. Pt reports associated dizziness, sob, and nausea.Pt states pain worsens with movement or touch.  PT is AxOx4. Pt was hypotensive with EMS. BP was 88/32

## 2023-04-26 NOTE — Discharge Instructions (Signed)
Overall please return if your symptoms worsen but lab work today is unremarkable.  I do not see any evidence of a heart attack.  Your COVID and flu test are negative as well.  Recommend ongoing pain management at home and follow-up with your primary care doctor for further pain management as needed.  Please return if symptoms worsen.

## 2023-04-26 NOTE — ED Notes (Signed)
Help get patient on the monitor patient is resitng with call bell in reach

## 2023-04-27 ENCOUNTER — Telehealth: Payer: Self-pay | Admitting: Family Medicine

## 2023-04-27 DIAGNOSIS — E039 Hypothyroidism, unspecified: Secondary | ICD-10-CM | POA: Diagnosis not present

## 2023-04-27 DIAGNOSIS — D631 Anemia in chronic kidney disease: Secondary | ICD-10-CM | POA: Diagnosis not present

## 2023-04-27 DIAGNOSIS — N1831 Chronic kidney disease, stage 3a: Secondary | ICD-10-CM | POA: Diagnosis not present

## 2023-04-27 DIAGNOSIS — I447 Left bundle-branch block, unspecified: Secondary | ICD-10-CM | POA: Diagnosis not present

## 2023-04-27 DIAGNOSIS — I340 Nonrheumatic mitral (valve) insufficiency: Secondary | ICD-10-CM | POA: Diagnosis not present

## 2023-04-27 DIAGNOSIS — G4733 Obstructive sleep apnea (adult) (pediatric): Secondary | ICD-10-CM | POA: Diagnosis not present

## 2023-04-27 DIAGNOSIS — M353 Polymyalgia rheumatica: Secondary | ICD-10-CM | POA: Diagnosis not present

## 2023-04-27 DIAGNOSIS — F319 Bipolar disorder, unspecified: Secondary | ICD-10-CM | POA: Diagnosis not present

## 2023-04-27 DIAGNOSIS — I951 Orthostatic hypotension: Secondary | ICD-10-CM | POA: Diagnosis not present

## 2023-04-27 MED ORDER — OXYCODONE HCL 5 MG PO TABS: 2.5000 mg | ORAL_TABLET | ORAL | 0 refills | Status: DC | PRN

## 2023-04-27 NOTE — Telephone Encounter (Signed)
Prescription Request  04/27/2023  Is this a "Controlled Substance" medicine? Yes  LOV: 04/07/2023  What is the name of the medication or equipment?   oxyCODONE (ROXICODONE) 5 MG immediate release tablet [[id number]]  Have you contacted your pharmacy to request a refill? No   Which pharmacy would you like this sent to?   [facility] [place], [place] - [address] [place] [address] Phone: [phone] Fax: [phone]  Patient notified that their request is being sent to the clinical staff for review and that they should receive a response within 2 business days.   Please advise at Mobile [phone] (mobile)

## 2023-04-27 NOTE — Addendum Note (Signed)
Addended by: Radene Gunning on: 04/27/2023 12:15 PM   Modules accepted: Orders

## 2023-04-27 NOTE — Telephone Encounter (Signed)
Called informed the patients husband of PCP instructions

## 2023-04-27 NOTE — Telephone Encounter (Signed)
Last OV--04/07/23 Last RF--04/07/23--#15 no refills

## 2023-04-27 NOTE — Telephone Encounter (Signed)
I'll fill once more, but want her to follow up with her pain specialist to discuss more.

## 2023-04-28 ENCOUNTER — Ambulatory Visit: Payer: Medicare PPO | Admitting: Family Medicine

## 2023-04-28 DIAGNOSIS — D631 Anemia in chronic kidney disease: Secondary | ICD-10-CM | POA: Diagnosis not present

## 2023-04-28 DIAGNOSIS — I340 Nonrheumatic mitral (valve) insufficiency: Secondary | ICD-10-CM | POA: Diagnosis not present

## 2023-04-28 DIAGNOSIS — G4733 Obstructive sleep apnea (adult) (pediatric): Secondary | ICD-10-CM | POA: Diagnosis not present

## 2023-04-28 DIAGNOSIS — N1831 Chronic kidney disease, stage 3a: Secondary | ICD-10-CM | POA: Diagnosis not present

## 2023-04-28 DIAGNOSIS — I447 Left bundle-branch block, unspecified: Secondary | ICD-10-CM | POA: Diagnosis not present

## 2023-04-28 DIAGNOSIS — E039 Hypothyroidism, unspecified: Secondary | ICD-10-CM | POA: Diagnosis not present

## 2023-04-28 DIAGNOSIS — F319 Bipolar disorder, unspecified: Secondary | ICD-10-CM | POA: Diagnosis not present

## 2023-04-28 DIAGNOSIS — I951 Orthostatic hypotension: Secondary | ICD-10-CM | POA: Diagnosis not present

## 2023-04-28 DIAGNOSIS — M353 Polymyalgia rheumatica: Secondary | ICD-10-CM | POA: Diagnosis not present

## 2023-04-29 DIAGNOSIS — M62838 Other muscle spasm: Secondary | ICD-10-CM | POA: Diagnosis not present

## 2023-04-29 DIAGNOSIS — M353 Polymyalgia rheumatica: Secondary | ICD-10-CM | POA: Diagnosis not present

## 2023-04-29 DIAGNOSIS — I340 Nonrheumatic mitral (valve) insufficiency: Secondary | ICD-10-CM | POA: Diagnosis not present

## 2023-04-29 DIAGNOSIS — M542 Cervicalgia: Secondary | ICD-10-CM | POA: Diagnosis not present

## 2023-04-29 DIAGNOSIS — N1831 Chronic kidney disease, stage 3a: Secondary | ICD-10-CM | POA: Diagnosis not present

## 2023-04-29 DIAGNOSIS — I951 Orthostatic hypotension: Secondary | ICD-10-CM | POA: Diagnosis not present

## 2023-04-29 DIAGNOSIS — E039 Hypothyroidism, unspecified: Secondary | ICD-10-CM | POA: Diagnosis not present

## 2023-04-29 DIAGNOSIS — G4733 Obstructive sleep apnea (adult) (pediatric): Secondary | ICD-10-CM | POA: Diagnosis not present

## 2023-04-29 DIAGNOSIS — F319 Bipolar disorder, unspecified: Secondary | ICD-10-CM | POA: Diagnosis not present

## 2023-04-29 DIAGNOSIS — D631 Anemia in chronic kidney disease: Secondary | ICD-10-CM | POA: Diagnosis not present

## 2023-04-29 DIAGNOSIS — I447 Left bundle-branch block, unspecified: Secondary | ICD-10-CM | POA: Diagnosis not present

## 2023-04-30 ENCOUNTER — Telehealth: Payer: Self-pay | Admitting: Family Medicine

## 2023-04-30 ENCOUNTER — Other Ambulatory Visit: Payer: Self-pay | Admitting: Family Medicine

## 2023-04-30 DIAGNOSIS — R251 Tremor, unspecified: Secondary | ICD-10-CM

## 2023-04-30 DIAGNOSIS — F319 Bipolar disorder, unspecified: Secondary | ICD-10-CM | POA: Diagnosis not present

## 2023-04-30 DIAGNOSIS — G4733 Obstructive sleep apnea (adult) (pediatric): Secondary | ICD-10-CM | POA: Diagnosis not present

## 2023-04-30 DIAGNOSIS — E039 Hypothyroidism, unspecified: Secondary | ICD-10-CM | POA: Diagnosis not present

## 2023-04-30 DIAGNOSIS — I447 Left bundle-branch block, unspecified: Secondary | ICD-10-CM | POA: Diagnosis not present

## 2023-04-30 DIAGNOSIS — I951 Orthostatic hypotension: Secondary | ICD-10-CM | POA: Diagnosis not present

## 2023-04-30 DIAGNOSIS — D631 Anemia in chronic kidney disease: Secondary | ICD-10-CM | POA: Diagnosis not present

## 2023-04-30 DIAGNOSIS — N1831 Chronic kidney disease, stage 3a: Secondary | ICD-10-CM | POA: Diagnosis not present

## 2023-04-30 DIAGNOSIS — I340 Nonrheumatic mitral (valve) insufficiency: Secondary | ICD-10-CM | POA: Diagnosis not present

## 2023-04-30 DIAGNOSIS — M353 Polymyalgia rheumatica: Secondary | ICD-10-CM | POA: Diagnosis not present

## 2023-04-30 NOTE — Telephone Encounter (Signed)
Referral done to preferred location per patient request. Patient aware

## 2023-04-30 NOTE — Telephone Encounter (Signed)
Pt called requesting a referral to Neuro for shaking, falling to the following practice:  [facility] - [facility] [address], [place], [place] [address] P: [phone]  Pt stated that she has discussed these issues with Dr. Carmelia Roller before. Please Advise.

## 2023-05-03 ENCOUNTER — Ambulatory Visit (HOSPITAL_BASED_OUTPATIENT_CLINIC_OR_DEPARTMENT_OTHER): Payer: Medicare PPO

## 2023-05-03 DIAGNOSIS — G4733 Obstructive sleep apnea (adult) (pediatric): Secondary | ICD-10-CM | POA: Diagnosis not present

## 2023-05-03 DIAGNOSIS — E039 Hypothyroidism, unspecified: Secondary | ICD-10-CM | POA: Diagnosis not present

## 2023-05-03 DIAGNOSIS — D631 Anemia in chronic kidney disease: Secondary | ICD-10-CM | POA: Diagnosis not present

## 2023-05-03 DIAGNOSIS — N1831 Chronic kidney disease, stage 3a: Secondary | ICD-10-CM | POA: Diagnosis not present

## 2023-05-03 DIAGNOSIS — I447 Left bundle-branch block, unspecified: Secondary | ICD-10-CM | POA: Diagnosis not present

## 2023-05-03 DIAGNOSIS — I951 Orthostatic hypotension: Secondary | ICD-10-CM | POA: Diagnosis not present

## 2023-05-03 DIAGNOSIS — M353 Polymyalgia rheumatica: Secondary | ICD-10-CM | POA: Diagnosis not present

## 2023-05-03 DIAGNOSIS — I340 Nonrheumatic mitral (valve) insufficiency: Secondary | ICD-10-CM | POA: Diagnosis not present

## 2023-05-03 DIAGNOSIS — F319 Bipolar disorder, unspecified: Secondary | ICD-10-CM | POA: Diagnosis not present

## 2023-05-05 DIAGNOSIS — J329 Chronic sinusitis, unspecified: Secondary | ICD-10-CM | POA: Diagnosis not present

## 2023-05-05 DIAGNOSIS — R2689 Other abnormalities of gait and mobility: Secondary | ICD-10-CM | POA: Diagnosis not present

## 2023-05-05 DIAGNOSIS — Z9889 Other specified postprocedural states: Secondary | ICD-10-CM | POA: Diagnosis not present

## 2023-05-05 DIAGNOSIS — J3489 Other specified disorders of nose and nasal sinuses: Secondary | ICD-10-CM | POA: Diagnosis not present

## 2023-05-05 DIAGNOSIS — R259 Unspecified abnormal involuntary movements: Secondary | ICD-10-CM | POA: Diagnosis not present

## 2023-05-06 NOTE — Progress Notes (Signed)
ok 

## 2023-05-07 ENCOUNTER — Other Ambulatory Visit: Payer: Self-pay | Admitting: Physician Assistant

## 2023-05-07 DIAGNOSIS — D631 Anemia in chronic kidney disease: Secondary | ICD-10-CM | POA: Diagnosis not present

## 2023-05-07 DIAGNOSIS — E039 Hypothyroidism, unspecified: Secondary | ICD-10-CM | POA: Diagnosis not present

## 2023-05-07 DIAGNOSIS — I951 Orthostatic hypotension: Secondary | ICD-10-CM | POA: Diagnosis not present

## 2023-05-07 DIAGNOSIS — I340 Nonrheumatic mitral (valve) insufficiency: Secondary | ICD-10-CM | POA: Diagnosis not present

## 2023-05-07 DIAGNOSIS — I447 Left bundle-branch block, unspecified: Secondary | ICD-10-CM | POA: Diagnosis not present

## 2023-05-07 DIAGNOSIS — N1831 Chronic kidney disease, stage 3a: Secondary | ICD-10-CM | POA: Diagnosis not present

## 2023-05-07 DIAGNOSIS — F319 Bipolar disorder, unspecified: Secondary | ICD-10-CM | POA: Diagnosis not present

## 2023-05-07 DIAGNOSIS — G4733 Obstructive sleep apnea (adult) (pediatric): Secondary | ICD-10-CM | POA: Diagnosis not present

## 2023-05-07 DIAGNOSIS — M353 Polymyalgia rheumatica: Secondary | ICD-10-CM | POA: Diagnosis not present

## 2023-05-08 ENCOUNTER — Encounter (HOSPITAL_COMMUNITY): Payer: Self-pay

## 2023-05-09 DIAGNOSIS — M62838 Other muscle spasm: Secondary | ICD-10-CM | POA: Diagnosis not present

## 2023-05-09 DIAGNOSIS — M542 Cervicalgia: Secondary | ICD-10-CM | POA: Diagnosis not present

## 2023-05-11 DIAGNOSIS — J320 Chronic maxillary sinusitis: Secondary | ICD-10-CM | POA: Diagnosis not present

## 2023-05-11 DIAGNOSIS — Z9889 Other specified postprocedural states: Secondary | ICD-10-CM | POA: Diagnosis not present

## 2023-05-11 DIAGNOSIS — J3489 Other specified disorders of nose and nasal sinuses: Secondary | ICD-10-CM | POA: Diagnosis not present

## 2023-05-12 ENCOUNTER — Ambulatory Visit: Payer: Medicare PPO

## 2023-05-12 ENCOUNTER — Telehealth: Payer: Self-pay | Admitting: Family Medicine

## 2023-05-12 VITALS — BP 87/55 | HR 70 | Temp 97.7°F | Resp 16 | Ht 64.5 in | Wt 143.4 lb

## 2023-05-12 DIAGNOSIS — M353 Polymyalgia rheumatica: Secondary | ICD-10-CM | POA: Diagnosis not present

## 2023-05-12 DIAGNOSIS — M2550 Pain in unspecified joint: Secondary | ICD-10-CM | POA: Diagnosis not present

## 2023-05-12 MED ORDER — SODIUM CHLORIDE 0.9 % IV SOLN
1000.0000 mg | Freq: Once | INTRAVENOUS | Status: AC
  Administered 2023-05-12: 1000 mg via INTRAVENOUS
  Filled 2023-05-12: qty 16

## 2023-05-12 MED ORDER — HEPARIN SOD (PORK) LOCK FLUSH 100 UNIT/ML IV SOLN
500.0000 [IU] | Freq: Once | INTRAVENOUS | Status: AC | PRN
  Administered 2023-05-12: 500 [IU]

## 2023-05-12 NOTE — Telephone Encounter (Signed)
Called [facility] informed of ok per PCP

## 2023-05-12 NOTE — Telephone Encounter (Signed)
Caller/Agency: April, [facility]  Callback Number: [phone] Requesting OT/PT/Skilled Nursing/Social Work/Speech Therapy: PT Frequency: 1x2

## 2023-05-12 NOTE — Progress Notes (Signed)
Diagnosis: Polyarthralgia, polymyalgia rheumatica  Provider:  Chilton Greathouse MD  Procedure: IV Infusion  IV Type: Port-a-cath, IV Location: R Chest  Solumedrol (Methylprednisolone), Dose: 1000 mg  Infusion Start Time: 1503  Infusion Stop Time: 1603  Post Infusion IV Care: Port a Cath Deaccessed/Flushed  Discharge: Condition: Good, Destination: Home . AVS Provided  Performed by:  Wyvonne Lenz, RN

## 2023-05-13 DIAGNOSIS — I951 Orthostatic hypotension: Secondary | ICD-10-CM | POA: Diagnosis not present

## 2023-05-13 DIAGNOSIS — D631 Anemia in chronic kidney disease: Secondary | ICD-10-CM | POA: Diagnosis not present

## 2023-05-13 DIAGNOSIS — M353 Polymyalgia rheumatica: Secondary | ICD-10-CM | POA: Diagnosis not present

## 2023-05-13 DIAGNOSIS — G4733 Obstructive sleep apnea (adult) (pediatric): Secondary | ICD-10-CM | POA: Diagnosis not present

## 2023-05-13 DIAGNOSIS — N1831 Chronic kidney disease, stage 3a: Secondary | ICD-10-CM | POA: Diagnosis not present

## 2023-05-13 DIAGNOSIS — E039 Hypothyroidism, unspecified: Secondary | ICD-10-CM | POA: Diagnosis not present

## 2023-05-13 DIAGNOSIS — F319 Bipolar disorder, unspecified: Secondary | ICD-10-CM | POA: Diagnosis not present

## 2023-05-13 DIAGNOSIS — I340 Nonrheumatic mitral (valve) insufficiency: Secondary | ICD-10-CM | POA: Diagnosis not present

## 2023-05-13 DIAGNOSIS — I447 Left bundle-branch block, unspecified: Secondary | ICD-10-CM | POA: Diagnosis not present

## 2023-05-14 DIAGNOSIS — D631 Anemia in chronic kidney disease: Secondary | ICD-10-CM | POA: Diagnosis not present

## 2023-05-14 DIAGNOSIS — I447 Left bundle-branch block, unspecified: Secondary | ICD-10-CM | POA: Diagnosis not present

## 2023-05-14 DIAGNOSIS — F319 Bipolar disorder, unspecified: Secondary | ICD-10-CM | POA: Diagnosis not present

## 2023-05-14 DIAGNOSIS — N1831 Chronic kidney disease, stage 3a: Secondary | ICD-10-CM | POA: Diagnosis not present

## 2023-05-14 DIAGNOSIS — I340 Nonrheumatic mitral (valve) insufficiency: Secondary | ICD-10-CM | POA: Diagnosis not present

## 2023-05-14 DIAGNOSIS — G4733 Obstructive sleep apnea (adult) (pediatric): Secondary | ICD-10-CM | POA: Diagnosis not present

## 2023-05-14 DIAGNOSIS — I951 Orthostatic hypotension: Secondary | ICD-10-CM | POA: Diagnosis not present

## 2023-05-14 DIAGNOSIS — M353 Polymyalgia rheumatica: Secondary | ICD-10-CM | POA: Diagnosis not present

## 2023-05-14 DIAGNOSIS — E039 Hypothyroidism, unspecified: Secondary | ICD-10-CM | POA: Diagnosis not present

## 2023-05-18 DIAGNOSIS — I447 Left bundle-branch block, unspecified: Secondary | ICD-10-CM | POA: Diagnosis not present

## 2023-05-18 DIAGNOSIS — D631 Anemia in chronic kidney disease: Secondary | ICD-10-CM | POA: Diagnosis not present

## 2023-05-18 DIAGNOSIS — I951 Orthostatic hypotension: Secondary | ICD-10-CM | POA: Diagnosis not present

## 2023-05-18 DIAGNOSIS — F319 Bipolar disorder, unspecified: Secondary | ICD-10-CM | POA: Diagnosis not present

## 2023-05-18 DIAGNOSIS — G4733 Obstructive sleep apnea (adult) (pediatric): Secondary | ICD-10-CM | POA: Diagnosis not present

## 2023-05-18 DIAGNOSIS — M353 Polymyalgia rheumatica: Secondary | ICD-10-CM | POA: Diagnosis not present

## 2023-05-18 DIAGNOSIS — E039 Hypothyroidism, unspecified: Secondary | ICD-10-CM | POA: Diagnosis not present

## 2023-05-18 DIAGNOSIS — I340 Nonrheumatic mitral (valve) insufficiency: Secondary | ICD-10-CM | POA: Diagnosis not present

## 2023-05-18 DIAGNOSIS — N1831 Chronic kidney disease, stage 3a: Secondary | ICD-10-CM | POA: Diagnosis not present

## 2023-05-20 ENCOUNTER — Emergency Department (HOSPITAL_COMMUNITY): Payer: Medicare PPO

## 2023-05-20 ENCOUNTER — Emergency Department (HOSPITAL_COMMUNITY)
Admission: EM | Admit: 2023-05-20 | Discharge: 2023-05-20 | Disposition: A | Discharge status: Home / Self Care | Payer: Medicare PPO | Attending: Emergency Medicine | Admitting: Emergency Medicine

## 2023-05-20 ENCOUNTER — Other Ambulatory Visit: Payer: Self-pay

## 2023-05-20 ENCOUNTER — Encounter (HOSPITAL_COMMUNITY): Payer: Self-pay

## 2023-05-20 DIAGNOSIS — R531 Weakness: Secondary | ICD-10-CM | POA: Diagnosis not present

## 2023-05-20 DIAGNOSIS — M542 Cervicalgia: Secondary | ICD-10-CM | POA: Insufficient documentation

## 2023-05-20 DIAGNOSIS — Z20822 Contact with and (suspected) exposure to covid-19: Secondary | ICD-10-CM | POA: Diagnosis not present

## 2023-05-20 DIAGNOSIS — I959 Hypotension, unspecified: Secondary | ICD-10-CM | POA: Diagnosis not present

## 2023-05-20 DIAGNOSIS — R509 Fever, unspecified: Secondary | ICD-10-CM | POA: Diagnosis not present

## 2023-05-20 DIAGNOSIS — R42 Dizziness and giddiness: Secondary | ICD-10-CM | POA: Diagnosis not present

## 2023-05-20 DIAGNOSIS — R079 Chest pain, unspecified: Secondary | ICD-10-CM | POA: Insufficient documentation

## 2023-05-20 LAB — CBC WITH DIFFERENTIAL/PLATELET
Abs Immature Granulocytes: 0.03 10*3/uL (ref 0.00–0.07)
Basophils Absolute: 0.1 10*3/uL (ref 0.0–0.1)
Basophils Relative: 1 %
Eosinophils Absolute: 0.1 10*3/uL (ref 0.0–0.5)
Eosinophils Relative: 1 %
HCT: 39.6 % (ref 36.0–46.0)
Hemoglobin: 13.2 g/dL (ref 12.0–15.0)
Immature Granulocytes: 1 %
Lymphocytes Relative: 31 %
Lymphs Abs: 1.6 10*3/uL (ref 0.7–4.0)
MCH: 32 pg (ref 26.0–34.0)
MCHC: 33.3 g/dL (ref 30.0–36.0)
MCV: 95.9 fL (ref 80.0–100.0)
Monocytes Absolute: 0.5 10*3/uL (ref 0.1–1.0)
Monocytes Relative: 8 %
Neutro Abs: 3.1 10*3/uL (ref 1.7–7.7)
Neutrophils Relative %: 58 %
Platelets: 144 10*3/uL — ABNORMAL LOW (ref 150–400)
RBC: 4.13 MIL/uL (ref 3.87–5.11)
RDW: 15.5 % (ref 11.5–15.5)
WBC: 5.4 10*3/uL (ref 4.0–10.5)
nRBC: 0 % (ref 0.0–0.2)

## 2023-05-20 LAB — TROPONIN I (HIGH SENSITIVITY)
Troponin I (High Sensitivity): 7 ng/L (ref <18)
Troponin I (High Sensitivity): 8 ng/L (ref <18)

## 2023-05-20 LAB — URINALYSIS, W/ REFLEX TO CULTURE (INFECTION SUSPECTED)
Bilirubin Urine: NEGATIVE
Glucose, UA: NEGATIVE mg/dL
Hgb urine dipstick: NEGATIVE
Ketones, ur: NEGATIVE mg/dL
Nitrite: NEGATIVE
Protein, ur: NEGATIVE mg/dL
Specific Gravity, Urine: 1.012 (ref 1.005–1.030)
pH: 5 (ref 5.0–8.0)

## 2023-05-20 LAB — I-STAT CG4 LACTIC ACID, ED: Lactic Acid, Venous: 0.9 mmol/L (ref 0.5–1.9)

## 2023-05-20 LAB — COMPREHENSIVE METABOLIC PANEL
ALT: 47 U/L — ABNORMAL HIGH (ref 0–44)
AST: 53 U/L — ABNORMAL HIGH (ref 15–41)
Albumin: 3.4 g/dL — ABNORMAL LOW (ref 3.5–5.0)
Alkaline Phosphatase: 55 U/L (ref 38–126)
Anion gap: 13 (ref 5–15)
BUN: 31 mg/dL — ABNORMAL HIGH (ref 8–23)
CO2: 24 mmol/L (ref 22–32)
Calcium: 9.1 mg/dL (ref 8.9–10.3)
Chloride: 102 mmol/L (ref 98–111)
Creatinine, Ser: 1.43 mg/dL — ABNORMAL HIGH (ref 0.44–1.00)
GFR, Estimated: 38 mL/min — ABNORMAL LOW (ref >60)
Glucose, Bld: 98 mg/dL (ref 70–99)
Potassium: 3.4 mmol/L — ABNORMAL LOW (ref 3.5–5.1)
Sodium: 139 mmol/L (ref 135–145)
Total Bilirubin: 0.5 mg/dL (ref 0.3–1.2)
Total Protein: 6.4 g/dL — ABNORMAL LOW (ref 6.5–8.1)

## 2023-05-20 LAB — SARS CORONAVIRUS 2 BY RT PCR: SARS Coronavirus 2 by RT PCR: NEGATIVE

## 2023-05-20 LAB — MAGNESIUM: Magnesium: 2.3 mg/dL (ref 1.7–2.4)

## 2023-05-20 MED ORDER — LACTATED RINGERS IV BOLUS
500.0000 mL | Freq: Once | INTRAVENOUS | Status: AC
  Administered 2023-05-20: 500 mL via INTRAVENOUS

## 2023-05-20 MED ORDER — HEPARIN SOD (PORK) LOCK FLUSH 100 UNIT/ML IV SOLN
500.0000 [IU] | Freq: Once | INTRAVENOUS | Status: AC
  Administered 2023-05-20: 500 [IU]
  Filled 2023-05-20: qty 5

## 2023-05-20 MED ORDER — SODIUM CHLORIDE 0.9 % IV BOLUS
1000.0000 mL | Freq: Once | INTRAVENOUS | Status: AC
  Administered 2023-05-20: 1000 mL via INTRAVENOUS

## 2023-05-20 NOTE — ED Triage Notes (Signed)
The pt was bib EMS. The pt's aide called and stated pt has not been feeling well and running a fever. VS B/P 68/30. PMHx of hypotension. Placed in w/c trendelenburg 120/60. Refuses Ivs from EMS providers. VS B/P 120/6, CBG 120, O2 Sats 97% RA. Cyanosis to lips, she is pale and has anemia.

## 2023-05-20 NOTE — ED Provider Notes (Signed)
I received signout on this patient presenting here today with feeling generally unwell with multiple complaints.  The patient does have known history of orthostatic hypotension.  Was to follow-up on her urinalysis and x-rays and reassess for ultimate disposition.   Physical Exam  BP 126/71 (BP Location: Right Arm)   Pulse 70   Temp 97.7 F (36.5 C) (Oral)   Resp 14   Ht 5' 4.5" (1.638 m)   Wt 65 kg   SpO2 100%   BMI 24.23 kg/m   Physical Exam Vitals and nursing note reviewed.  Constitutional:      Appearance: Normal appearance.  Cardiovascular:     Rate and Rhythm: Normal rate and regular rhythm.  Pulmonary:     Effort: Pulmonary effort is normal.  Neurological:     Mental Status: She is alert.     Procedures  Procedures  ED Course / MDM    Medical Decision Making Amount and/or Complexity of Data Reviewed Labs: ordered. Radiology: ordered.   The patient's bilirubin is mildly elevated at her baseline.  Remainder of her labs are reassuring.  The patient's urinalysis does show some leukocytes and rare bacteria.  The patient denies any symptoms with this.  She does voice concerns for C. difficile in the past and when I spoke with her about antibiotic she does not want to take any now.  The patient states she is feeling a lot better after the IV fluids.  She is requesting discharge.  I have reviewed her x-ray as well as her urinalysis and labs and everything is looking relatively reassuring.  Unexplained to the patient that we will add on a urine culture and that if this is positive that she will receive a call and appropriate antibiotics will be started.  She is ultimately discharged with return precautions.       Durwin Glaze, MD 05/20/23 2111

## 2023-05-20 NOTE — ED Notes (Signed)
Pt currently getting x rays done (out of room)

## 2023-05-20 NOTE — ED Notes (Addendum)
7:10 PM Report received from previous RN. This RN assumes care of the patient.   8:01 PM  Patient is alert and oriented x 4. Patient has equal rise and fall of the chest wall with clear lung sounds. Patient endorses having SBP in 50s prior to arrival and feeling "not well" x several days. She currently reports feeling "a little better" at this time. She currently denies pain at this time. Pending urinalysis results. Call light in reach. Bed in lowest position. Vital signs updated. Patient's nurses aide at bedside.   9:36 PM  Discharge instruction discussed with patient and patient voices understanding of discharge instructions. She denies any questions, needs, or concerns regarding discharge. She is stable at discharge. Wheelchair provided for patient. Patient's aide to transport patient home.

## 2023-05-20 NOTE — Discharge Instructions (Signed)
Please drink plenty of fluids.  We did add on a urine culture.  If this comes back positive one of our pharmacist will give you a call pleat.  Please follow-up with your doctor and return to the ER for worsening symptoms.

## 2023-05-20 NOTE — ED Provider Notes (Signed)
[place] EMERGENCY DEPARTMENT AT [facility] Provider Note   CSN: [id number] Arrival date & time: 05/20/23  1214     History  Chief Complaint  Patient presents with   Fever    Megan Brewer is a 77 y.o. female.  HPI 77 year old female presents with "feeling like crap".  She has been feeling this way for the past several days.  She has been dealing with a sinus infection for a few months and has been having some diarrhea but nothing like when she had C. difficile.  She states that she has acute on chronic neck pain.  She has chronic neck pain for the last few years to her posterior neck, more on the right side.  However, it has been worse over the last few days.  No trauma or fall.  No headache or new neurosymptoms though does have a chronic movement disorder.  She has had chest pain but states this whenever she has her hypotensive episodes which is daily.  Last chest pain was this morning.  Patient had an episode where she was hypotensive into the 50s in the bathroom.  She denies any actual fever but has chronic chills.  No focal weakness though does feel overall weak.  Home Medications Prior to Admission medications   Medication Sig Start Date End Date Taking? Authorizing Provider  acetaminophen (TYLENOL) 500 MG tablet Take 1,000 mg by mouth as needed for moderate pain.    [provider]  azelastine (ASTELIN) 0.1 % nasal spray Place 2 sprays into both nostrils 2 (two) times daily. 03/10/21   Hetty Blend, FNP  baclofen (LIORESAL) 10 MG tablet Take 10 mg by mouth daily as needed for muscle spasms. 06/03/20   [provider]  CALCIUM PO Take 6 tablets by mouth daily.    [provider]  cholecalciferol (VITAMIN D3) 25 MCG (1000 UNIT) tablet Take 1,000 Units by mouth daily.    [provider]  cycloSPORINE (RESTASIS) 0.05 % ophthalmic emulsion Place 1 drop into both eyes in the morning, at noon, in the evening, and at bedtime.     [provider]  denosumab (PROLIA) 60 MG/ML SOSY injection Pt to get at office.  Appt is 02/10/23 01/19/23   Sharlene Dory, DO  dexlansoprazole (DEXILANT) 60 MG capsule Take 1 capsule (60 mg total) by mouth daily. Patient needs follow up appointment for future refills. Please call [phone] to schedule an appointment. 05/07/23   Esterwood, Amy S, PA-C  famotidine (PEPCID) 20 MG tablet Take 1 tablet (20 mg total) by mouth at bedtime. 04/30/22   Esterwood, Amy S, PA-C  ferrous sulfate 325 (65 FE) MG tablet Take 650 mg by mouth daily with breakfast.    [provider]  fludrocortisone (FLORINEF) 0.1 MG tablet Take 0.1 mg by mouth daily.    [provider]  GAMMAGARD 20 GM/200ML SOLN Inject 40 g into the vein every 21 ( twenty-one) days. INFUSE 40G INTRAVENOUSLY EVERY 3 WEEKS 02/27/22   [provider]  heparin lock flush 100 UNIT/ML SOLN injection Inject 500 Units into the vein every 21 ( twenty-one) days. 03/31/21   [provider]  ibuprofen (ADVIL) 200 MG tablet Take 600 mg by mouth as needed for mild pain.    [provider]  KEVZARA 200 MG/1. SOAJ Inject 1.14 mLs into the skin every 14 (fourteen) days. 09/09/22   [provider]  levothyroxine (SYNTHROID) 75 MCG tablet TAKE ONE (1) TABLET BY MOUTH EACH  DAY BEFORE BREAKFAST ON EMPTY STOMACH 04/07/23   Sharlene Dory, DO  magnesium oxide (MAG-OX) 400 MG tablet Take 400 mg by mouth daily.     [provider]  midodrine (PROAMATINE) 10 MG tablet Take 1 tablet (10 mg total) by mouth 3 (three) times daily. 02/24/23   Wendling, Jilda Roche, DO  MYRBETRIQ 50 MG TB24 tablet Take 50 mg by mouth daily. 10/07/22   [provider]  ondansetron (ZOFRAN) 8 MG tablet TAKE ONE TABLET BY MOUTH EVERY EIGHT HOURS AS NEEDED FOR NAUSEA OR VOMITING 02/15/23   Wendling, Jilda Roche, DO  oxyCODONE (ROXICODONE) 5 MG immediate release tablet Take 0.5-1 tablets (2.5-5  mg total) by mouth every 4 (four) hours as needed for severe pain. 04/27/23   Wendling, Jilda Roche, DO  potassium chloride (KLOR-CON) 10 MEQ tablet TAKE ONE (1) TABLET BY MOUTH FOUR (4) TIMES DAILY 04/26/23   Wendling, Jilda Roche, DO  pregabalin (LYRICA) 50 MG capsule Take 1 capsule (50 mg total) by mouth 3 (three) times daily. 04/07/23   Sharlene Dory, DO  prochlorperazine (COMPAZINE) 10 MG tablet Take 1 tablet (10 mg total) by mouth 2 (two) times daily as needed for nausea or vomiting. 04/05/23   Arthor Captain, PA-C  pyridoxine (B-6) 100 MG tablet Take 100 mg by mouth daily.    Sharlene Dory, DO  rOPINIRole (REQUIP) 0.5 MG tablet Take 0.5 mg by mouth 3 (three) times daily.    Sharlene Dory, DO  sucralfate (CARAFATE) 1 GM/10ML suspension TAKE 10 MLS (1 G TOTAL) BY MOUTH 4 TIMESDAILY AS NEEDED 01/25/23   Sharlene Dory, DO  tamsulosin (FLOMAX) 0.4 MG CAPS capsule Take 1 capsule (0.4 mg total) by mouth daily. 01/23/21   Burnadette Pop, MD  torsemide 40 MG TABS Take 20 mg by mouth 2 (two) times daily. 03/31/23   Carlos Levering, NP  triamcinolone cream (KENALOG) 0.1 % Apply 1 application  topically 2 (two) times daily as needed (rash).    [provider]  vitamin B-12 (CYANOCOBALAMIN) 1000 MCG tablet Take 1,000 mcg by mouth daily. 02/14/21   [provider]  Vitamin D, Ergocalciferol, (DRISDOL) 1.25 MG (50000 UNIT) CAPS capsule TAKE 1 CAPSULE ONCE WEEKLY 01/04/23   [name], Drema Pry, MD  ZINC OXIDE, TOPICAL, 10 % CREA Apply 1 application topically 2 (two) times daily as needed (rash). 02/14/21   Glade Lloyd, MD      Allergies    Butorphanol, Erythromycin base, Pregabalin, Pregabalin er, Propoxyphene, Sumatriptan, Tetracycline, Ciprofloxacin, Corticosteroids, Doxycycline, Cefixime, Gabapentin, Isometheptene-dichloral-apap, Ketorolac, Prednisone, Promethazine, and Tape    Review of Systems   Review of Systems  Constitutional:  Positive for  chills. Negative for fever.  HENT:  Negative for sore throat.   Respiratory:  Negative for shortness of breath.   Cardiovascular:  Positive for chest pain.  Gastrointestinal:  Positive for diarrhea. Negative for abdominal pain, blood in stool and constipation.  Genitourinary:  Negative for dysuria.  Musculoskeletal:  Positive for neck pain.  Neurological:  Positive for weakness. Negative for headaches.    Physical Exam Updated Vital Signs BP 138/72 (BP Location: Left Arm)   Pulse 90   Temp 98.3 F (36.8 C) (Oral)   Resp 16   Ht 5' 4.5" (1.638 m)   Wt 65 kg   SpO2 98%   BMI 24.23 kg/m  Physical Exam Vitals and nursing note reviewed.  Constitutional:      Appearance: She is well-developed.  HENT:  Head: Normocephalic and atraumatic.     Mouth/Throat:     Pharynx: No oropharyngeal exudate or posterior oropharyngeal erythema.  Eyes:     Extraocular Movements: Extraocular movements intact.  Neck:   Cardiovascular:     Rate and Rhythm: Normal rate and regular rhythm.     Heart sounds: Normal heart sounds.  Pulmonary:     Effort: Pulmonary effort is normal.     Breath sounds: Normal breath sounds.  Abdominal:     Palpations: Abdomen is soft.     Tenderness: There is no abdominal tenderness.  Musculoskeletal:     Cervical back: Normal range of motion. No rigidity. Muscular tenderness present. No spinous process tenderness.  Skin:    General: Skin is warm and dry.  Neurological:     Mental Status: She is alert.     Comments: CN 3-12 grossly intact. 5/5 strength in all 4 extremities. Grossly normal sensation. Has tremor with movement.     ED Results / Procedures / Treatments   Labs (all labs ordered are listed, but only abnormal results are displayed) Labs Reviewed  COMPREHENSIVE METABOLIC PANEL - Abnormal; Notable for the following components:      Result Value   Potassium 3.4 (*)    BUN 31 (*)    Creatinine, Ser 1.43 (*)    Total Protein 6.4 (*)    Albumin  3.4 (*)    AST 53 (*)    ALT 47 (*)    GFR, Estimated 38 (*)    All other components within normal limits  CBC WITH DIFFERENTIAL/PLATELET - Abnormal; Notable for the following components:   Platelets 144 (*)    All other components within normal limits  SARS CORONAVIRUS 2 BY RT PCR  CULTURE, BLOOD (ROUTINE X 2)  CULTURE, BLOOD (ROUTINE X 2)  MAGNESIUM  URINALYSIS, W/ REFLEX TO CULTURE (INFECTION SUSPECTED)  I-STAT CG4 LACTIC ACID, ED  TROPONIN I (HIGH SENSITIVITY)  TROPONIN I (HIGH SENSITIVITY)    EKG EKG Interpretation Date/Time:  Thursday May 20 2023 13:34:54 EDT Ventricular Rate:  74 PR Interval:  141 QRS Duration:  142 QT Interval:  447 QTC Calculation: 496 R Axis:   43  Text Interpretation: Sinus rhythm IVCD, consider atypical LBBB similar to Sept 2024 Confirmed by Pricilla Loveless [id number]) on 05/20/2023 1:51:25 PM  Radiology No results found.  Procedures Procedures    Medications Ordered in ED Medications  lactated ringers bolus 500 mL (500 mLs Intravenous New Bag/Given 05/20/23 1346)    ED Course/ Medical Decision Making/ A&P                                 Medical Decision Making Amount and/or Complexity of Data Reviewed Labs: ordered.    Details: Stable CKD Radiology: ordered. ECG/medicine tests: ordered and independent interpretation performed.    Details: Chronic LBBB   Patient presents with what sounds like generalized weakness versus just not feeling well.  No clear cause.  Will pursue infectious workup though right now she has no fever (despite chief complaint).  She tells me she is never really had a fever, just does not feel well.  She was given some fluids.  She is not currently hypotensive.  She has recurrent hypotensive episodes at home and this was not particularly different today.  Will get troponins to help rule out MI though this is of lower suspicion.  She does have some neck pain but this  is a chronic issue that seems to be worse over  the last few days.  However no neurosymptoms such as dizziness, headache, vision changes (besides blurry vision when she nearly passes out).  I do not think a CTA is needed.  I have low suspicion for an acute CNS infection such as meningitis.  Currently waiting on urine and supportive care and reassessment.  Care transferred to Dr. Rhae Hammock.        Final Clinical Impression(s) / ED Diagnoses Final diagnoses:  None    Rx / DC Orders ED Discharge Orders     None         Pricilla Loveless, MD 05/20/23 1511

## 2023-05-21 DIAGNOSIS — M353 Polymyalgia rheumatica: Secondary | ICD-10-CM | POA: Diagnosis not present

## 2023-05-21 DIAGNOSIS — I340 Nonrheumatic mitral (valve) insufficiency: Secondary | ICD-10-CM | POA: Diagnosis not present

## 2023-05-21 DIAGNOSIS — E039 Hypothyroidism, unspecified: Secondary | ICD-10-CM | POA: Diagnosis not present

## 2023-05-21 DIAGNOSIS — G4733 Obstructive sleep apnea (adult) (pediatric): Secondary | ICD-10-CM | POA: Diagnosis not present

## 2023-05-21 DIAGNOSIS — D631 Anemia in chronic kidney disease: Secondary | ICD-10-CM | POA: Diagnosis not present

## 2023-05-21 DIAGNOSIS — F319 Bipolar disorder, unspecified: Secondary | ICD-10-CM | POA: Diagnosis not present

## 2023-05-21 DIAGNOSIS — N1831 Chronic kidney disease, stage 3a: Secondary | ICD-10-CM | POA: Diagnosis not present

## 2023-05-21 DIAGNOSIS — I447 Left bundle-branch block, unspecified: Secondary | ICD-10-CM | POA: Diagnosis not present

## 2023-05-21 DIAGNOSIS — I951 Orthostatic hypotension: Secondary | ICD-10-CM | POA: Diagnosis not present

## 2023-05-22 ENCOUNTER — Other Ambulatory Visit: Payer: Self-pay | Admitting: Physician Assistant

## 2023-05-24 DIAGNOSIS — I951 Orthostatic hypotension: Secondary | ICD-10-CM | POA: Diagnosis not present

## 2023-05-24 DIAGNOSIS — G4733 Obstructive sleep apnea (adult) (pediatric): Secondary | ICD-10-CM | POA: Diagnosis not present

## 2023-05-24 DIAGNOSIS — I447 Left bundle-branch block, unspecified: Secondary | ICD-10-CM | POA: Diagnosis not present

## 2023-05-24 DIAGNOSIS — M353 Polymyalgia rheumatica: Secondary | ICD-10-CM | POA: Diagnosis not present

## 2023-05-24 DIAGNOSIS — I340 Nonrheumatic mitral (valve) insufficiency: Secondary | ICD-10-CM | POA: Diagnosis not present

## 2023-05-24 DIAGNOSIS — E039 Hypothyroidism, unspecified: Secondary | ICD-10-CM | POA: Diagnosis not present

## 2023-05-24 DIAGNOSIS — F319 Bipolar disorder, unspecified: Secondary | ICD-10-CM | POA: Diagnosis not present

## 2023-05-24 DIAGNOSIS — N1831 Chronic kidney disease, stage 3a: Secondary | ICD-10-CM | POA: Diagnosis not present

## 2023-05-24 DIAGNOSIS — D631 Anemia in chronic kidney disease: Secondary | ICD-10-CM | POA: Diagnosis not present

## 2023-05-24 LAB — URINE CULTURE: Culture: >=100000 — AB

## 2023-05-25 ENCOUNTER — Encounter: Payer: Self-pay | Admitting: Neurology

## 2023-05-25 ENCOUNTER — Telehealth: Payer: Self-pay

## 2023-05-25 ENCOUNTER — Telehealth (HOSPITAL_BASED_OUTPATIENT_CLINIC_OR_DEPARTMENT_OTHER): Payer: Self-pay | Admitting: *Deleted

## 2023-05-25 ENCOUNTER — Ambulatory Visit: Payer: Medicare PPO | Admitting: Neurology

## 2023-05-25 DIAGNOSIS — Z029 Encounter for administrative examinations, unspecified: Secondary | ICD-10-CM

## 2023-05-25 LAB — CULTURE, BLOOD (ROUTINE X 2)
Culture: NO GROWTH
Culture: NO GROWTH
Special Requests: ADEQUATE
Special Requests: ADEQUATE

## 2023-05-25 NOTE — Telephone Encounter (Signed)
Transition Care Management Unsuccessful Follow-up Telephone Call  Date of discharge and from where:  Megan Brewer 9/9  Attempts:  2nd Attempt  Reason for unsuccessful TCM follow-up call:  No answer/busy

## 2023-05-25 NOTE — Progress Notes (Signed)
ED Antimicrobial Stewardship Positive Culture Follow Up   Megan Brewer is an 77 y.o. female who presented to [facility] on 05/20/2023 with a chief complaint of  Chief Complaint  Patient presents with   Fever    Recent Results (from the past 720 hour(s))  Resp panel by RT-PCR (RSV, Flu A&B, Covid) Anterior Nasal Swab     Status: None   Collection Time: 04/26/23  9:49 AM   Specimen: Anterior Nasal Swab  Result Value Ref Range Status   SARS Coronavirus 2 by RT PCR NEGATIVE NEGATIVE Final   Influenza A by PCR NEGATIVE NEGATIVE Final   Influenza B by PCR NEGATIVE NEGATIVE Final    Comment: (NOTE) The Xpert Xpress SARS-CoV-2/FLU/RSV plus assay is intended as an aid in the diagnosis of influenza from Nasopharyngeal swab specimens and should not be used as a sole basis for treatment. Nasal washings and aspirates are unacceptable for Xpert Xpress SARS-CoV-2/FLU/RSV testing.  Fact Sheet for Patients: BloggerCourse.com  Fact Sheet for Healthcare Providers: SeriousBroker.it  This test is not yet approved or cleared by the [place] FDA and has been authorized for detection and/or diagnosis of SARS-CoV-2 by FDA under an Emergency Use Authorization (EUA). This EUA will remain in effect (meaning this test can be used) for the duration of the COVID-19 declaration under Section 564(b)(1) of the Act, 21 U.S.C. section 360bbb-3(b)(1), unless the authorization is terminated or revoked.     Resp Syncytial Virus by PCR NEGATIVE NEGATIVE Final    Comment: (NOTE) Fact Sheet for Patients: BloggerCourse.com  Fact Sheet for Healthcare Providers: SeriousBroker.it  This test is not yet approved or cleared by the [place] FDA and has been authorized for detection and/or diagnosis of SARS-CoV-2 by FDA under an Emergency Use Authorization (EUA). This EUA will remain in effect (meaning  this test can be used) for the duration of the COVID-19 declaration under Section 564(b)(1) of the Act, 21 U.S.C. section 360bbb-3(b)(1), unless the authorization is terminated or revoked.  Performed at [facility], [address], [place], [place] [address]   Culture, blood (routine x 2)     Status: None (Preliminary result)   Collection Time: 05/20/23  1:34 PM   Specimen: BLOOD  Result Value Ref Range Status   Specimen Description   Final    BLOOD LEFT ANTECUBITAL Performed at [facility], [address], [place], [place] [address]    Special Requests   Final    BOTTLES DRAWN AEROBIC AND ANAEROBIC Blood Culture adequate volume Performed at [facility], [address] [place], [place] [address]    Culture   Final    NO GROWTH 4 DAYS Performed at [facility], [address], [place], [place] [address]    Report Status PENDING  Incomplete  Culture, blood (routine x 2)     Status: None (Preliminary result)   Collection Time: 05/20/23  1:34 PM   Specimen: BLOOD  Result Value Ref Range Status   Specimen Description   Final    BLOOD RIGHT ANTECUBITAL Performed at [facility], [address], [place], [place] [address]    Special Requests   Final    BOTTLES DRAWN AEROBIC AND ANAEROBIC Blood Culture adequate volume Performed at [facility], [address] [place], [place] [address]    Culture   Final    NO GROWTH 4 DAYS Performed at [facility], [address], [place], [place]  [address]    Report Status PENDING  Incomplete  SARS Coronavirus 2 by RT PCR (hospital order, performed in [facility] hospital lab) *cepheid single result test* Anterior Nasal Swab     Status: None   Collection Time: 05/20/23  1:39 PM   Specimen: Anterior Nasal Swab  Result Value Ref Range Status   SARS Coronavirus 2 by RT PCR NEGATIVE NEGATIVE Final    Comment: (NOTE) SARS-CoV-2 target nucleic acids  are NOT DETECTED.  The SARS-CoV-2 RNA is generally detectable in upper and lower respiratory specimens during the acute phase of infection. The lowest concentration of SARS-CoV-2 viral copies this assay can detect is 250 copies / mL. A negative result does not preclude SARS-CoV-2 infection and should not be used as the sole basis for treatment or other patient management decisions.  A negative result may occur with improper specimen collection / handling, submission of specimen other than nasopharyngeal swab, presence of viral mutation(s) within the areas targeted by this assay, and inadequate number of viral copies (<250 copies / mL). A negative result must be combined with clinical observations, patient history, and epidemiological information.  Fact Sheet for Patients:   RoadLapTop.co.za  Fact Sheet for Healthcare Providers: http://kim-miller.com/  This test is not yet approved or  cleared by the [place] FDA and has been authorized for detection and/or diagnosis of SARS-CoV-2 by FDA under an Emergency Use Authorization (EUA).  This EUA will remain in effect (meaning this test can be used) for the duration of the COVID-19 declaration under Section 564(b)(1) of the Act, 21 U.S.C. section 360bbb-3(b)(1), unless the authorization is terminated or revoked sooner.  Performed at [facility], [address], [place], [place] [address]   Urine Culture     Status: Abnormal   Collection Time: 05/20/23  7:15 PM   Specimen: Urine, Random  Result Value Ref Range Status   Specimen Description   Final    URINE, RANDOM Performed at [facility], [address] [place], [place] [address]    Special Requests   Final    NONE Reflexed from [id number] Performed at [facility], [address] [place], [place] [address]    Culture (A)  Final    >=100,000 COLONIES/mL ESCHERICHIA  COLI 50,000 COLONIES/mL KLEBSIELLA OXYTOCA Confirmed Extended Spectrum Beta-Lactamase Producer (ESBL).  In bloodstream infections from ESBL organisms, carbapenems are preferred over piperacillin/tazobactam. They are shown to have a lower risk of mortality.    Report Status 05/24/2023 FINAL  Final   Organism ID, Bacteria ESCHERICHIA COLI (A)  Final   Organism ID, Bacteria KLEBSIELLA OXYTOCA (A)  Final      Susceptibility   Escherichia coli - MIC*    AMPICILLIN >=32 RESISTANT Resistant     CEFAZOLIN 8 SENSITIVE Sensitive     CEFEPIME <=0.12 SENSITIVE Sensitive     CEFTRIAXONE <=0.25 SENSITIVE Sensitive     CIPROFLOXACIN <=0.25 SENSITIVE Sensitive     GENTAMICIN <=1 SENSITIVE Sensitive     IMIPENEM <=0.25 SENSITIVE Sensitive     NITROFURANTOIN <=16 SENSITIVE Sensitive     TRIMETH/SULFA <=20 SENSITIVE Sensitive     AMPICILLIN/SULBACTAM >=32 RESISTANT Resistant     PIP/TAZO <=4 SENSITIVE Sensitive     * >=100,000 COLONIES/mL ESCHERICHIA COLI   Klebsiella oxytoca - MIC*    AMPICILLIN >=32 RESISTANT Resistant     CEFEPIME 2 SENSITIVE Sensitive     CEFTRIAXONE >=64 RESISTANT Resistant     CIPROFLOXACIN 0.5 INTERMEDIATE Intermediate  GENTAMICIN >=16 RESISTANT Resistant     IMIPENEM <=0.25 SENSITIVE Sensitive     NITROFURANTOIN 32 SENSITIVE Sensitive     TRIMETH/SULFA >=320 RESISTANT Resistant     AMPICILLIN/SULBACTAM >=32 RESISTANT Resistant     PIP/TAZO 8 SENSITIVE Sensitive     * 50,000 COLONIES/mL KLEBSIELLA OXYTOCA   Patient presents with CC of feeling poorly, hypotension, and mild diarrhea.Denied urinary urgency, frequency, or dysuria. UA was moderate for leukocytes, WBC 21-50, few bacteria, and epithelial cells 0-5. No treatment given on discharge and no treatment indicated based on review.   ED Provider: Melton Alar, PA-C    [facility] 05/25/2023, 9:40 AM Clinical Pharmacist Monday - Friday phone -  [phone] Saturday - Sunday phone - [phone]

## 2023-05-25 NOTE — Progress Notes (Deleted)
Follow-up Visit   Date: 05/25/23   Megan Brewer MRN: [id number] DOB: 05/25/1946   Interim History: Megan Brewer is a 77 y.o. 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 The patient was accompanied to the clinic by self.  IMPRESSION/PLAN: Functional movement disorder.  Movements are distractible during the exam and there are periods of the visit when she has no movements.  She does have some mild degree of essential tremor, but this would not manifest with such irregular trunk and leg movements.  I discussed that stress/anxiety/depression can manifest with such movements as which time she became tearful telling me that she has depression for 40 + years and has seen a number of specialists. I recommend that she continue to work with her counselor.  I will also send a referral for home PT to work on gait training.   Restless leg syndrome, stable  - Previously on ropinirole and sinemet - OK to continue Lyrica 25mg  BID  Benign tremors of the hands  No exam findings to suggest parkinson's disease  - OK to continue primidone 50mg  TID   Return to clinic in 9 months  ------------------------------------------------------------------------  UPDATE 08/24/2022:  She is here for 9 month follow-up visit. Since the summer, she has developed abnormal movements of the hands, arms, trunk, and legs as well as generalized pain.  She was diagnosed with polymyalgia rheumatica in August and started on solumedrol infusion.  The infusions have helped her pain, but no change in her movements.  She reports jerking movement of the arms and legs can be present at rest and with activity.  She is unable to control it.  She  is able to continue to do ADLs such as bathing, dressing and feeding.  She uses a wheelchair at home.  Her ropinirole and sinemet was switched to Lyrica for RLS by PCP.  She continues to have a lot of depression and is seeing a Veterinary surgeon.   UPDATE 05/25/2023:  She is here for 9 months visit.  ***  Medications:  Current Outpatient Medications on File Prior to Visit  Medication Sig Dispense Refill   acetaminophen (TYLENOL) 500 MG tablet Take 1,000 mg by mouth as needed for moderate pain.     azelastine (ASTELIN) 0.1 % nasal spray Place 2 sprays into both nostrils 2 (two) times daily. 30 mL 5   baclofen (LIORESAL) 10 MG tablet Take 10 mg by mouth daily as needed for muscle spasms.     CALCIUM PO Take 6 tablets by mouth daily.     cholecalciferol (VITAMIN D3) 25 MCG (1000 UNIT) tablet Take 1,000 Units by mouth daily.     cycloSPORINE (RESTASIS) 0.05 % ophthalmic emulsion Place 1 drop into both eyes in the morning, at noon, in the evening, and at bedtime.     denosumab (PROLIA) 60 MG/ML SOSY injection Pt to get at office.  Appt is 02/10/23 1 mL 0   dexlansoprazole (DEXILANT) 60 MG capsule Take 1 capsule (60 mg total) by mouth daily. Patient needs follow up appointment for future refills. Please call [phone] to schedule an appointment. 30 capsule 0   famotidine (PEPCID) 20 MG tablet TAKE ONE TABLET BY MOUTH EVERY NIGHT AT BEDTIME 30 tablet 0   ferrous sulfate 325 (65 FE)  MG tablet Take 650 mg by mouth daily with breakfast.     fludrocortisone (FLORINEF) 0.1 MG tablet Take 0.1 mg by mouth daily.     GAMMAGARD 20 GM/200ML SOLN Inject 40 g into the vein every 21 ( twenty-one) days. INFUSE 40G INTRAVENOUSLY EVERY 3 WEEKS     heparin lock flush 100 UNIT/ML SOLN injection Inject 500 Units into the vein every 21 ( twenty-one) days.     ibuprofen (ADVIL) 200 MG tablet Take 600 mg by mouth as needed for mild pain.     KEVZARA 200 MG/1. SOAJ Inject 1.14 mLs into the skin every 14 (fourteen) days.      levothyroxine (SYNTHROID) 75 MCG tablet TAKE ONE (1) TABLET BY MOUTH EACH DAY BEFORE BREAKFAST ON EMPTY STOMACH 90 tablet 1   magnesium oxide (MAG-OX) 400 MG tablet Take 400 mg by mouth daily.      midodrine (PROAMATINE) 10 MG tablet Take 1 tablet (10 mg total) by mouth 3 (three) times daily. 90 tablet 5   MYRBETRIQ 50 MG TB24 tablet Take 50 mg by mouth daily.     ondansetron (ZOFRAN) 8 MG tablet TAKE ONE TABLET BY MOUTH EVERY EIGHT HOURS AS NEEDED FOR NAUSEA OR VOMITING 20 tablet 0   oxyCODONE (ROXICODONE) 5 MG immediate release tablet Take 0.5-1 tablets (2.5-5 mg total) by mouth every 4 (four) hours as needed for severe pain. 15 tablet 0   potassium chloride (KLOR-CON) 10 MEQ tablet TAKE ONE (1) TABLET BY MOUTH FOUR (4) TIMES DAILY 360 tablet 1   pregabalin (LYRICA) 50 MG capsule Take 1 capsule (50 mg total) by mouth 3 (three) times daily. 270 capsule 1   prochlorperazine (COMPAZINE) 10 MG tablet Take 1 tablet (10 mg total) by mouth 2 (two) times daily as needed for nausea or vomiting. 10 tablet 0   pyridoxine (B-6) 100 MG tablet Take 100 mg by mouth daily.     rOPINIRole (REQUIP) 0.5 MG tablet Take 0.5 mg by mouth 3 (three) times daily.     sucralfate (CARAFATE) 1 GM/10ML suspension TAKE 10 MLS (1 G TOTAL) BY MOUTH 4 TIMESDAILY AS NEEDED 420 mL 3   tamsulosin (FLOMAX) 0.4 MG CAPS capsule Take 1 capsule (0.4 mg total) by mouth daily. 30 capsule 1   torsemide 40 MG TABS Take 20 mg by mouth 2 (two) times daily. 180 tablet 3   triamcinolone cream (KENALOG) 0.1 % Apply 1 application  topically 2 (two) times daily as needed (rash).     vitamin B-12 (CYANOCOBALAMIN) 1000 MCG tablet Take 1,000 mcg by mouth daily.     Vitamin D, Ergocalciferol, (DRISDOL) 1.25 MG (50000 UNIT) CAPS capsule TAKE 1 CAPSULE ONCE WEEKLY 20 capsule 0   ZINC OXIDE, TOPICAL, 10 % CREA Apply 1 application topically 2 (two) times daily as needed (rash).     Current Facility-Administered Medications on File Prior to Visit   Medication Dose Route Frequency Provider Last Rate Last Admin   methylPREDNISolone sodium succinate (SOLU-MEDROL) 1,000 mg in sodium chloride 0.9 % 50 mL IVPB  1,000 mg Intravenous Once Desma Mcgregor, RPH       methylPREDNISolone sodium succinate (SOLU-MEDROL) 1,000 mg in sodium chloride 0.9 % 50 mL IVPB  1,000 mg Intravenous Once Desma Mcgregor, [facility]        Allergies:  Allergies  Allergen Reactions   Butorphanol Hives, Other (See Comments) and Swelling    Cerebral pain, "like someone is brushing my brain with a brillo pad"  Other Reaction(s): Not available  Erythromycin Base Diarrhea, Itching and Other (See Comments)    Severe diarrhea   Pregabalin    Pregabalin Er    Propoxyphene Nausea Only and Other (See Comments)    Depression, Agitation, Blurred Vision  Other Reaction(s): Not available   Sumatriptan Nausea And Vomiting, Other (See Comments) and Tinitus    Ringing in the ears, migraines are worse  Other Reaction(s): Not available   Tetracycline Nausea Only, Other (See Comments) and Tinitus    Causes ringing in ears, dizziness, migraine, nausea  Other Reaction(s): Not available   Ciprofloxacin Hives, Other (See Comments) and Tinitus    Headache, dizziness, ringing in the ears  Other Reaction(s): Not available   Corticosteroids Other (See Comments)    12/02/2016 interview by MWN: Oral dosing causes migraines/nausea/tinnitus. Prev tolerated IV and intranasal admin w/o difficulty.   Doxycycline Other (See Comments)    unknown  Other Reaction(s): Not available   Cefixime Other (See Comments)    Headache  Other Reaction(s): Not available   Gabapentin Other (See Comments) and Swelling    Throat swells/closes  Other Reaction(s): Not available   Isometheptene-Dichloral-Apap Other (See Comments)    Unknown, mood liability   Ketorolac     12/02/2016 interview by [facility]: Oral dosing prednisone/corticosteroids causes migraines/nausea/tinnitus. Prev tolerated IV and intranasal  admin w/o difficulty.  Other Reaction(s): Not available   Prednisone Other (See Comments)    Migraine, dizzy, ringing in ears-can take it IV  12/02/2016 interview by [name]: Oral prednisone dosing causes migraines/nausea/tinnitus. Prev tolerated IV and intranasal admin w/o difficulty.  Other Reaction(s): Not available   Promethazine Other (See Comments)    causes severe abdominal pain when taken IV; however she can take PO or IM  Other Reaction(s): Not available   Tape Other (See Comments)    Adhesive tapes-patient denies    Vital Signs:  There were no vitals taken for this visit.  Neurological Exam: MENTAL STATUS including orientation to time, place, person, recent and remote memory, attention span and concentration, language, and fund of knowledge is normal.  Speech is not dysarthric. She is crying during parts of the visit.   CRANIAL NERVES:  No visual field defects.  Pupils equal round and reactive to light.  Normal conjugate, extra-ocular eye movements in all directions of gaze.  No ptosis.  She tends to clench her teeth when talking.   MOTOR:  There is intermittent give-way weakness during motor testing, but all muscle groups are antigravity and able to resist.  Generalized irregular jerking of the hand, truck, and leg movements are seen with side-to-side movements of the legs, intermittent kicking. Movements do not represent dyskinesia or chorea or tremor.   These movements are distractible.  Tone is normal.  There is a mild intention tremor of the hands.    MSRs:  Reflexes are 2+/4 throughout, except 3+/4 at the patella bilaterally  SENSORY:  Intact to vibration throughout.  COORDINATION/GAIT:  Normal finger-to- nose-finger.  Intact rapid alternating movements bilaterally.  Gait not tested, patient arrived in wheelchair.   Data: Lab Results  Component Value Date   FERRITIN 42 02/06/2021    Thank you for allowing me to participate in patient's care.  If I can answer any  additional questions, I would be pleased to do so.    Sincerely,    Rileigh Kawashima K. Allena Katz, DO

## 2023-05-25 NOTE — Telephone Encounter (Signed)
Transition Care Management Unsuccessful Follow-up Telephone Call  Date of discharge and from where:  [facility] 9/9  Attempts:  1st Attempt  Reason for unsuccessful TCM follow-up call:  No answer/busy   Lenard Forth [place]  [facility], [facility] Guide, Phone: [phone] Website: [email or web address]

## 2023-05-25 NOTE — Telephone Encounter (Signed)
Post ED Visit - Positive Culture Follow-up  Culture report reviewed by antimicrobial stewardship pharmacist: [facility] []  Enzo Bi, Pharm.D. []  Celedonio Miyamoto, Pharm.D., BCPS AQ-ID []  Garvin Fila, Pharm.D., BCPS []  Georgina Pillion, Pharm.D., BCPS []  [place], [address].D., BCPS, AAHIVP []  Estella Husk, Pharm.D., BCPS, AAHIVP []  Lysle Pearl, PharmD, BCPS []  Phillips Climes, PharmD, BCPS []  Agapito Games, PharmD, BCPS []  Verlan Friends, PharmD []  Mervyn Gay, PharmD, BCPS []  Vinnie Level, PharmD  [facility] [x]  Roslyn Smiling, PharmD []  Greer Pickerel, PharmD []  Adalberto Cole, PharmD []  Perlie Gold, Rph []  Lonell Face) Jean Rosenthal, PharmD []  Earl Many, PharmD []  Junita Push, PharmD []  Dorna Leitz, PharmD []  Terrilee Files, PharmD []  Lynann Beaver, PharmD []  Keturah Barre, PharmD []  Loralee Pacas, PharmD []  Bernadene Person, PharmD   Positive urine culture Treated with no antibiotics, no further patient follow-up is required at this time Per  Karren Burly, Dagmar Hait 05/25/2023, 9:52 AM

## 2023-05-26 DIAGNOSIS — I951 Orthostatic hypotension: Secondary | ICD-10-CM | POA: Diagnosis not present

## 2023-05-26 DIAGNOSIS — M353 Polymyalgia rheumatica: Secondary | ICD-10-CM | POA: Diagnosis not present

## 2023-05-26 DIAGNOSIS — E039 Hypothyroidism, unspecified: Secondary | ICD-10-CM | POA: Diagnosis not present

## 2023-05-26 DIAGNOSIS — G4733 Obstructive sleep apnea (adult) (pediatric): Secondary | ICD-10-CM | POA: Diagnosis not present

## 2023-05-26 DIAGNOSIS — D631 Anemia in chronic kidney disease: Secondary | ICD-10-CM | POA: Diagnosis not present

## 2023-05-26 DIAGNOSIS — I447 Left bundle-branch block, unspecified: Secondary | ICD-10-CM | POA: Diagnosis not present

## 2023-05-26 DIAGNOSIS — I340 Nonrheumatic mitral (valve) insufficiency: Secondary | ICD-10-CM | POA: Diagnosis not present

## 2023-05-26 DIAGNOSIS — F319 Bipolar disorder, unspecified: Secondary | ICD-10-CM | POA: Diagnosis not present

## 2023-05-26 DIAGNOSIS — N1831 Chronic kidney disease, stage 3a: Secondary | ICD-10-CM | POA: Diagnosis not present

## 2023-05-27 ENCOUNTER — Other Ambulatory Visit (HOSPITAL_BASED_OUTPATIENT_CLINIC_OR_DEPARTMENT_OTHER): Payer: Medicare PPO

## 2023-05-28 ENCOUNTER — Ambulatory Visit: Payer: Medicare PPO | Admitting: Cardiology

## 2023-05-29 DIAGNOSIS — M542 Cervicalgia: Secondary | ICD-10-CM | POA: Diagnosis not present

## 2023-05-29 DIAGNOSIS — M62838 Other muscle spasm: Secondary | ICD-10-CM | POA: Diagnosis not present

## 2023-06-01 ENCOUNTER — Encounter: Payer: Self-pay | Admitting: Physical Medicine and Rehabilitation

## 2023-06-01 ENCOUNTER — Other Ambulatory Visit (HOSPITAL_BASED_OUTPATIENT_CLINIC_OR_DEPARTMENT_OTHER): Payer: Self-pay

## 2023-06-01 ENCOUNTER — Encounter: Payer: Medicare PPO | Attending: Physical Medicine and Rehabilitation | Admitting: Physical Medicine and Rehabilitation

## 2023-06-01 ENCOUNTER — Encounter: Payer: Self-pay | Admitting: Family Medicine

## 2023-06-01 VITALS — BP 102/62 | HR 87 | Ht 64.5 in | Wt 143.0 lb

## 2023-06-01 DIAGNOSIS — E039 Hypothyroidism, unspecified: Secondary | ICD-10-CM | POA: Diagnosis not present

## 2023-06-01 DIAGNOSIS — G259 Extrapyramidal and movement disorder, unspecified: Secondary | ICD-10-CM

## 2023-06-01 DIAGNOSIS — M62838 Other muscle spasm: Secondary | ICD-10-CM | POA: Insufficient documentation

## 2023-06-01 DIAGNOSIS — Z7409 Other reduced mobility: Secondary | ICD-10-CM

## 2023-06-01 DIAGNOSIS — F319 Bipolar disorder, unspecified: Secondary | ICD-10-CM | POA: Diagnosis not present

## 2023-06-01 DIAGNOSIS — K589 Irritable bowel syndrome without diarrhea: Secondary | ICD-10-CM | POA: Diagnosis not present

## 2023-06-01 DIAGNOSIS — I447 Left bundle-branch block, unspecified: Secondary | ICD-10-CM | POA: Diagnosis not present

## 2023-06-01 DIAGNOSIS — I951 Orthostatic hypotension: Secondary | ICD-10-CM | POA: Diagnosis not present

## 2023-06-01 DIAGNOSIS — M353 Polymyalgia rheumatica: Secondary | ICD-10-CM | POA: Diagnosis not present

## 2023-06-01 DIAGNOSIS — G4733 Obstructive sleep apnea (adult) (pediatric): Secondary | ICD-10-CM | POA: Diagnosis not present

## 2023-06-01 DIAGNOSIS — I340 Nonrheumatic mitral (valve) insufficiency: Secondary | ICD-10-CM | POA: Diagnosis not present

## 2023-06-01 DIAGNOSIS — R52 Pain, unspecified: Secondary | ICD-10-CM | POA: Diagnosis not present

## 2023-06-01 DIAGNOSIS — D631 Anemia in chronic kidney disease: Secondary | ICD-10-CM | POA: Diagnosis not present

## 2023-06-01 DIAGNOSIS — I959 Hypotension, unspecified: Secondary | ICD-10-CM

## 2023-06-01 DIAGNOSIS — N1831 Chronic kidney disease, stage 3a: Secondary | ICD-10-CM | POA: Diagnosis not present

## 2023-06-01 MED ORDER — INFLUENZA VAC A&B SURF ANT ADJ 0.5 ML IM SUSY
0.5000 mL | PREFILLED_SYRINGE | Freq: Once | INTRAMUSCULAR | 0 refills | Status: AC
  Filled 2023-06-01: qty 0.5, 1d supply, fill #0

## 2023-06-01 MED ORDER — COVID-19 MRNA VAC-TRIS(PFIZER) 30 MCG/0.3ML IM SUSY
0.3000 mL | PREFILLED_SYRINGE | Freq: Once | INTRAMUSCULAR | 0 refills | Status: AC
  Filled 2023-06-01: qty 0.3, 1d supply, fill #0

## 2023-06-01 NOTE — Progress Notes (Addendum)
p  Subjective:    Patient ID: Megan Brewer, female    DOB: [birth year], 77 y.o.   MRN: [id number]  HPI 1) Diffuse pain, has been attributed to polymyalgia rheumatica Megan Brewer is a 77 year old woman who presents to establish care for diffuse pain.  -it is present in her wrists, clavicles, left hip -the prednisone helps to control the pain but it only lasts 6 weeks and by the times she could go for the second infusion it was bad.  -she will be seeing her rheumatologist in a few weeks and would be very grateful if he were able to increased the frequency to q6 weeks -has been pretty good  -The Iv prednisone helped a great deal for some time.  -she has f/u with rheumatology -they have tried high doses of prednisone, low does hydrocodone, low doses Tramadol, lidocaine patches. Nothing has been helpful yet -she thinks the pain started after she spent 1 month at [facility] for sepsis and C diff. She left the hospital on June 1st. On June 4th she started with breathing difficulties, shaking, and some pain. It gradually got worse.  -her husband accompanies her today -steroids did not help leg pain -interested in cervical traction device -she asked her rheumatologist if she can gradually reduce the amount of steroid- they are talking about the IV steroids, she does not take any oral steroids -discussed that changes in weather exacerbate her pain   2) Shortness of breath -breathing without pain for the first time! -had been present July 2022 to July 2023 -she also feels a deflated rubber tire on her chest -she has been told her heart and lungs are fine -the steroids may have helped her to breath better -at the hospital where she was diagnosed she got a massive IV dose of steroids and this cleared up her lungs  3) Muscle spasms: -she has tried flexeril in the past with benefit -she recently tried robaxin and this caused severe symptomatic hypotension requiring her to go to the ED  4)  Hypotension: -fluids help -occurs suddenly -she is taking the midodrine and florinef -she takes midodrine at 7am/7pm  5) Fluid retention: -she is on the flomax for this   6) Movement disorder -was sent to neurologist at [facility]  7) family stress -husband has been limiting in walking due to need for removal of spinal mass -she has a visiting angel who has been helpful to her in the interim    Pain Inventory Average Pain  5 Pain Right Now 8 My pain is intermittent, constant, sharp, burning, stabbing, and aching  In the last 24 hours, has pain interfered with the following? General activity 10 Relation with others 10 Enjoyment of life 10 What TIME of day is your pain at its worst? morning , daytime, evening, and night Sleep (in general) Poor  Pain is worse with: unsure Pain improves with: rest Relief from Meds: little    New pt    Family History  Problem Relation Age of Onset   Hypertension Mother    GER disease Mother    Dementia Mother    Osteoporosis Mother    Arthritis Mother    Bipolar disorder Mother    Parkinson's disease Father    Congestive Heart Failure Father    Pneumonia Father    Arthritis Father    Dementia Father    Depression Father    Asthma Sister    Depression Sister    Bipolar disorder Brother  Asthma Daughter    Colon cancer Neg Hx    Esophageal cancer Neg Hx    Stomach cancer Neg Hx    Rectal cancer Neg Hx    Social History   Socioeconomic History   Marital status: Married    Spouse name: Not on file   Number of children: 2   Years of education: Not on file   Highest education level: GED or equivalent  Occupational History   Occupation: retired  Tobacco Use   Smoking status: Never   Smokeless tobacco: Never  Vaping Use   Vaping status: Never Used  Substance and Sexual Activity   Alcohol use: Not Currently   Drug use: Never   Sexual activity: Not on file  Other Topics Concern   Not on file  Social History  Narrative   Diet: None      Caffeine: coffee, tea, sodas less than or 1 daily.      Married, if yes what year: Yes, 1972      Do you live in a house, apartment, assisted living, condo, trailer, ect: Two story house       Pets: None      Current/Past profession: Teach      Exercise: None         Living Will: No   DNR: No   POA/HPOA: No      Functional Status:   Do you have difficulty bathing or dressing yourself? yes   Do you have difficulty preparing food or eating? yes   Do you have difficulty managing your medications? yes   Do you have difficulty managing your finances?yes   Do you have difficulty affording your medications? No      Right Handed    Lives in a two story home    Social Determinants of Health   Financial Resource Strain: Low Risk  (12/16/2022)   [facility]   Overall Financial Resource Strain (CARDIA)    Difficulty of Paying Living Expenses: Not hard at all  Food Insecurity: No Food Insecurity (12/25/2022)   Hunger Vital Sign    Worried About Running Out of Food in the Last Year: Never true    Ran Out of Food in the Last Year: Never true  Transportation Needs: No Transportation Needs (12/25/2022)   PRAPARE - Administrator, Civil Service (Medical): No    Lack of Transportation (Non-Medical): No  Physical Activity: Inactive (03/19/2023)   Exercise Vital Sign    Days of Exercise per Week: 0 days    Minutes of Exercise per Session: 0 min  Stress: No Stress Concern Present (03/19/2023)   [employer] of Occupational Health - Occupational Stress Questionnaire    Feeling of Stress : Not at all  Social Connections: Socially Isolated (03/19/2023)   Social Connection and Isolation Panel [NHANES]    Frequency of Communication with Friends and Family: Once a week    Frequency of Social Gatherings with Friends and Family: Once a week    Attends Religious Services: Never    Loss adjuster, chartered or Organizations: No    Attends Engineer, structural: Never    Marital Status: Married   Past Surgical History:  Procedure Laterality Date   ABDOMINAL HYSTERECTOMY     CARPAL TUNNEL RELEASE     CATARACT EXTRACTION Bilateral 2019   CHOLECYSTECTOMY     COLONOSCOPY  2015   [facility]   CT LUNG SCREENING  2018   DENTAL SURGERY  06/17/2021   4 teeth rmoved   DG  BONE DENSITY ([facility] HX)  2018   DIAGNOSTIC MAMMOGRAM  2019   GASTRIC BYPASS     LUNG CANCER SURGERY  02/2010   MULTIPLE TOOTH EXTRACTIONS     PORT A CATH REVISION Right 04/2021   PORT-A-CATH REMOVAL Left 02/07/2021   Procedure: REMOVAL PORT-A-CATH;  Surgeon: Sheliah Hatch De Blanch, MD;  Location: [facility];  Service: General;  Laterality: Left;  60 MINUTES ROOM 4   SPINAL CORD STIMULATOR IMPLANT     Past Medical History:  Diagnosis Date   Anemia    Arachnoiditis    Bipolar disorder (HCC)    Cataract    removed   Chronic post-thoracotomy pain    CKD (chronic kidney disease)    GERD (gastroesophageal reflux disease)    Hypogammaglobulinemia (HCC)    Hypotension    Hypothyroid    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HCC)    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HCC)    Lung cancer (HCC) 2011, 2014   Memory loss    Migraine    Opioid abuse (HCC)    Osteoporosis    PMR (polymyalgia rheumatica) (HCC) 03/17/2022   Presence of neurostimulator    Restless legs    Sleep apnea    There were no vitals taken for this visit.  Opioid Risk Score:   Fall Risk Score:  `1  Depression screen [facility] 2/9     03/19/2023    9:48 AM 02/22/2023    2:24 PM 10/27/2022   11:10 AM 06/11/2022   10:09 AM 09/30/2021   11:02 AM 02/04/2021    3:28 PM 09/27/2020   10:57 AM  Depression screen PHQ 2/9  Decreased Interest 0 1 1 3  0 0 0  Down, Depressed, Hopeless 0 1 1 3  0 0 0  PHQ - 2 Score 0 2 2 6  0 0 0  Altered sleeping    3     Tired, decreased energy    3     Change in appetite    2     Feeling bad or failure about yourself     3      Trouble concentrating    0     Moving slowly or fidgety/restless    3     Suicidal thoughts    3     PHQ-9 Score    23     Difficult doing work/chores    Extremely dIfficult         Review of Systems  Respiratory:  Positive for shortness of breath.   Gastrointestinal:  Positive for constipation and nausea.  Musculoskeletal:  Positive for gait problem.       Spasms  Neurological:  Positive for dizziness, tremors and weakness.  Psychiatric/Behavioral:  Positive for dysphoric mood. The patient is nervous/anxious.   All other systems reviewed and are negative.      Objective:   Physical Exam Gen: no distress, normal appearing HEENT: oral mucosa pink and moist, NCAT Cardio: Reg rate Chest: normal effort, normal rate of breathing Abd: soft, non-distended Ext: +lower extremity edena, improved Psych: pleasant, normal affect Skin: intact Neuro: Alert and oriented x3. Slow speech, rhythmic movements of face and hand MSK: protracted cervical posture    Assessment & Plan:   1) Chronic Pain Syndrome  with diffuse pain secondary to Polymylagia rheumatica -Discussed current symptoms of pain and history of pain.  -discussed that steroids have been helpful but she is discussing  with her rheumatologist about tapering these -Discussed benefits of exercise in reducing pain. -food allergen testing ordered  -discussed that she was not breast fed as a child, she was born via a vaginal delivery -discussed that she was given a lot of antibiotics as a child.  -discussed that the only thing that has been helpful was IV steroids. Continue, recommended discussing with rheumatology increasing frequency to q6 weeks.  -discontinue cymbalta 20mg  daily.  -discussed that steroids are helping -continue ergocalciferol 50,000U  -will send message to her care team (Dr. Carmelia Roller PCP, Dr. Antoine Poche cardiologist, Dr. Isabelle Course rheumatologist) to discuss doing IV steroid treatment.  -will call her  rheumatologist today to discuss Kevzara and Plaquenil.  -discussed her follow-up with rheumatology, who placed her on low dose prednisone 5mg  BID.  -Discussed following foods that may reduce pain: 1) Ginger (especially studied for arthritis)- reduce leukotriene production to decrease inflammation 2) Blueberries- high in phytonutrients that decrease inflammation 3) Salmon- marine omega-3s reduce joint swelling and pain 4) Pumpkin seeds- reduce inflammation 5) dark chocolate- reduces inflammation 6) turmeric- reduces inflammation 7) tart cherries - reduce pain and stiffness 8) extra virgin olive oil - its compound olecanthal helps to block prostaglandins  9) chili peppers- can be eaten or applied topically via capsaicin 10) mint- helpful for headache, muscle aches, joint pain, and itching 11) garlic- reduces inflammation  Link to further information on diet for chronic pain: [email or web address]    2) Shortness of breath secondary to lung cancer -discussed that it started one year ago -discussed that cardio pulmonary workup has been normal -discussed that her IV steroids were very helpful.  -ordered pulmonary rehab  3) Muscle spasms: -prescribed robaxin  4) Cervicalgia: -.Prescribed Zynex Nexwave, cervical traction device -discussed trigger point injections   5) Movement disorder: -referred to [facility] Movement Disorders clinic -discussed that she cannot do MRIs.   6) Muscle spasms: -fled/c flexeril -discussed her negative response to robaxin  7) Hypotension: -increase midodrine to 10mg  BID, continue this dose -continue fludrocortisone -discussed abdominal binder and teds -discussed that steroids can help -discussed that flomax can cause this, switch to night -encouraged adequate hydration  8) CKD: Cr monitored and Cr is stable.   9) Iron deficiency: -discussed dietary sources of iron such as  red meat, green leafy vegetables, chickpeas  10) Impaired balance: -discussed her plan to start PT -discussed that she is walking more than she was, discussed that she walked to the kitchen to get water -discussed that she has a visiting angel every day- discussed within an hour of her showing up she felt like they were sisters and they have a connection with each. Discussed that she goes to the grocery store for them  11) Movement disorder: -discussed that she is beng followed by neurology

## 2023-06-01 NOTE — Patient Instructions (Signed)
Foods that may reduce pain: 1) Ginger (especially studied for arthritis)- reduce leukotriene production to decrease inflammation 2) Blueberries- high in phytonutrients that decrease inflammation 3) Salmon- marine omega-3s reduce joint swelling and pain 4) Pumpkin seeds- reduce inflammation 5) dark chocolate- reduces inflammation 6) turmeric- reduces inflammation 7) tart cherries - reduce pain and stiffness 8) extra virgin olive oil - its compound olecanthal helps to block prostaglandins  9) chili peppers- can be eaten or applied topically via capsaicin 10) mint- helpful for headache, muscle aches, joint pain, and itching 11) garlic- reduces inflammation  Link to further information on diet for chronic pain: [email or web address]

## 2023-06-02 ENCOUNTER — Ambulatory Visit (INDEPENDENT_AMBULATORY_CARE_PROVIDER_SITE_OTHER): Payer: Medicare PPO | Admitting: Family Medicine

## 2023-06-02 ENCOUNTER — Other Ambulatory Visit (HOSPITAL_BASED_OUTPATIENT_CLINIC_OR_DEPARTMENT_OTHER): Payer: Self-pay

## 2023-06-02 ENCOUNTER — Encounter: Payer: Self-pay | Admitting: Family Medicine

## 2023-06-02 VITALS — BP 110/68 | HR 88 | Temp 98.0°F | Resp 16 | Ht 64.0 in | Wt 143.0 lb

## 2023-06-02 DIAGNOSIS — L989 Disorder of the skin and subcutaneous tissue, unspecified: Secondary | ICD-10-CM | POA: Diagnosis not present

## 2023-06-02 DIAGNOSIS — L308 Other specified dermatitis: Secondary | ICD-10-CM

## 2023-06-02 NOTE — Patient Instructions (Addendum)
Try not to scratch as this can make things worse. Avoid scented products while dealing with this. You may resume when the itchiness resolves. Cold/cool compresses can help. Consider Aveeno.  If you do not hear anything about your referral in the next 1-2 weeks, call our office and ask for an update.  Consider Claritin/Allegra who help with itching.   Let us know if you need anything.

## 2023-06-02 NOTE — Progress Notes (Signed)
Chief Complaint  Patient presents with   Itching    Discuss skin Itching    Megan Brewer is a 77 y.o. female here for a skin complaint.  Duration: several years, coming and going Location: torso, arms, legs Pruritic? Yes Painful? Not usually, sometimes will come randomly with a short burst of sharp pain Drainage? No New soaps/lotions/topicals/detergents? No, changed to to non-scented detergents Sick contacts? No Other associated symptoms: scaly, sheeding Therapies tried thus far: honey/olive oil, steroid cream  Past Medical History:  Diagnosis Date   Anemia    Arachnoiditis    Bipolar disorder (HCC)    Cataract    removed   Chronic post-thoracotomy pain    CKD (chronic kidney disease)    GERD (gastroesophageal reflux disease)    Hypogammaglobulinemia (HCC)    Hypotension    Hypothyroid    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HCC)    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HCC)    Lung cancer (HCC) 2011, 2014   Memory loss    Migraine    Opioid abuse (HCC)    Osteoporosis    PMR (polymyalgia rheumatica) (HCC) 03/17/2022   Presence of neurostimulator    Restless legs    Sleep apnea     BP 110/68 (BP Location: Left Arm, Patient Position: Sitting, Cuff Size: Normal)   Pulse 88   Temp 98 F (36.7 C) (Oral)   Resp 16   Ht 5\' 4"  (1.626 m)   Wt 143 lb (64.9 kg)   SpO2 99%   BMI 24.55 kg/m  Gen: awake, alert, appearing stated age Lungs: No accessory muscle use Skin: See below. No drainage, erythema, TTP, fluctuance, excoriation Psych: Age appropriate judgment and insight       Skin lesion of left arm - Plan: Ambulatory referral to Dermatology  Pruritic dermatitis  Various lentigos, seborrheic keratoses, AVMs.  There is a patch of hyperpigmentation surrounding a seborrheic keratosis on her left forearm that has reportedly grown in size.  Will refer to dermatology.  Reassurance given for most other lesions.  Consider nonscented emollient.  Consider an oral  antihistamine.  Try not to scratch.  Avoid scented products. Had a discussion about a message the pharmacist gave to her husband when she picked up a short-term course of pain medicine.  I do not see any evidence of [place] giving the pharmacy any message nor do I send the pharmacy messages through short-term prescriptions.  I apologized for the miscommunication and stated that that was not a message from me or our office that I can see. F/u prn. The patient voiced understanding and agreement to the plan.  I spent 32 minutes with the patient discussing the above plans in addition to reviewing her chart on the same day of the visit.  Jilda Roche [place], DO 06/02/23 12:03 PM

## 2023-06-03 ENCOUNTER — Encounter: Payer: Medicare PPO | Admitting: Physical Medicine and Rehabilitation

## 2023-06-03 DIAGNOSIS — N3020 Other chronic cystitis without hematuria: Secondary | ICD-10-CM | POA: Diagnosis not present

## 2023-06-03 DIAGNOSIS — R3915 Urgency of urination: Secondary | ICD-10-CM | POA: Diagnosis not present

## 2023-06-03 DIAGNOSIS — R1084 Generalized abdominal pain: Secondary | ICD-10-CM | POA: Diagnosis not present

## 2023-06-03 LAB — ALLERGENS (22) FOODS IGG
Casein IgG: 8.9 ug/mL — ABNORMAL HIGH (ref 0.0–1.9)
Cheese, Mold Type IgG: 20.6 ug/mL — ABNORMAL HIGH (ref 0.0–1.9)
Chicken IgG: <2 ug/mL (ref 0.0–1.9)
Chocolate/Cacao IgG: 4.9 ug/mL — ABNORMAL HIGH (ref 0.0–1.9)
Coffee IgG: 3.8 ug/mL — ABNORMAL HIGH (ref 0.0–1.9)
Corn IgG: 6.8 ug/mL — ABNORMAL HIGH (ref 0.0–1.9)
Egg, Whole IgG: 12.9 ug/mL — ABNORMAL HIGH (ref 0.0–1.9)
Green Bean IgG: 2.6 ug/mL — ABNORMAL HIGH (ref 0.0–1.9)
Haddock IgG: <2 ug/mL (ref 0.0–1.9)
Lamb IgG: 2.8 ug/mL — ABNORMAL HIGH (ref 0.0–1.9)
Oat IgG: 6.9 ug/mL — ABNORMAL HIGH (ref 0.0–1.9)
Onion IgG: 5.3 ug/mL — ABNORMAL HIGH (ref 0.0–1.9)
Peanut IgG: 2.3 ug/mL — ABNORMAL HIGH (ref 0.0–1.9)
Pork IgG: 3.7 ug/mL — ABNORMAL HIGH (ref 0.0–1.9)
Potato, White, IgG: <2 ug/mL (ref 0.0–1.9)
Rye IgG: 7.5 ug/mL — ABNORMAL HIGH (ref 0.0–1.9)
Shrimp IgG: 3.5 ug/mL — ABNORMAL HIGH (ref 0.0–1.9)
Soybean IgG: <2 ug/mL (ref 0.0–1.9)
Tomato IgG: 2.8 ug/mL — ABNORMAL HIGH (ref 0.0–1.9)
Wheat IgG: 7.1 ug/mL — ABNORMAL HIGH (ref 0.0–1.9)
Yeast IgG: 4.2 ug/mL — ABNORMAL HIGH (ref 0.0–1.9)

## 2023-06-04 ENCOUNTER — Other Ambulatory Visit: Payer: Self-pay | Admitting: Physician Assistant

## 2023-06-04 ENCOUNTER — Telehealth: Payer: Self-pay | Admitting: Family Medicine

## 2023-06-04 DIAGNOSIS — I951 Orthostatic hypotension: Secondary | ICD-10-CM | POA: Diagnosis not present

## 2023-06-04 DIAGNOSIS — M353 Polymyalgia rheumatica: Secondary | ICD-10-CM | POA: Diagnosis not present

## 2023-06-04 DIAGNOSIS — E039 Hypothyroidism, unspecified: Secondary | ICD-10-CM | POA: Diagnosis not present

## 2023-06-04 DIAGNOSIS — F319 Bipolar disorder, unspecified: Secondary | ICD-10-CM | POA: Diagnosis not present

## 2023-06-04 DIAGNOSIS — I340 Nonrheumatic mitral (valve) insufficiency: Secondary | ICD-10-CM | POA: Diagnosis not present

## 2023-06-04 DIAGNOSIS — I447 Left bundle-branch block, unspecified: Secondary | ICD-10-CM | POA: Diagnosis not present

## 2023-06-04 DIAGNOSIS — N1831 Chronic kidney disease, stage 3a: Secondary | ICD-10-CM | POA: Diagnosis not present

## 2023-06-04 DIAGNOSIS — G4733 Obstructive sleep apnea (adult) (pediatric): Secondary | ICD-10-CM | POA: Diagnosis not present

## 2023-06-04 DIAGNOSIS — D631 Anemia in chronic kidney disease: Secondary | ICD-10-CM | POA: Diagnosis not present

## 2023-06-04 NOTE — Telephone Encounter (Signed)
Caller/Agency: [facility]  Callback Number: [phone] (secure) Requesting OT/PT/Skilled Nursing/Social Work/Speech Therapy: PT Frequency: 1x for 9 weeks

## 2023-06-04 NOTE — Telephone Encounter (Signed)
Called left a detailed message of PCP verbal ok

## 2023-06-07 DIAGNOSIS — F319 Bipolar disorder, unspecified: Secondary | ICD-10-CM | POA: Diagnosis not present

## 2023-06-07 DIAGNOSIS — G4733 Obstructive sleep apnea (adult) (pediatric): Secondary | ICD-10-CM | POA: Diagnosis not present

## 2023-06-07 DIAGNOSIS — I951 Orthostatic hypotension: Secondary | ICD-10-CM | POA: Diagnosis not present

## 2023-06-07 DIAGNOSIS — I340 Nonrheumatic mitral (valve) insufficiency: Secondary | ICD-10-CM | POA: Diagnosis not present

## 2023-06-07 DIAGNOSIS — N1831 Chronic kidney disease, stage 3a: Secondary | ICD-10-CM | POA: Diagnosis not present

## 2023-06-07 DIAGNOSIS — E039 Hypothyroidism, unspecified: Secondary | ICD-10-CM | POA: Diagnosis not present

## 2023-06-07 DIAGNOSIS — I447 Left bundle-branch block, unspecified: Secondary | ICD-10-CM | POA: Diagnosis not present

## 2023-06-07 DIAGNOSIS — D631 Anemia in chronic kidney disease: Secondary | ICD-10-CM | POA: Diagnosis not present

## 2023-06-07 DIAGNOSIS — M353 Polymyalgia rheumatica: Secondary | ICD-10-CM | POA: Diagnosis not present

## 2023-06-08 ENCOUNTER — Ambulatory Visit (HOSPITAL_BASED_OUTPATIENT_CLINIC_OR_DEPARTMENT_OTHER)
Admission: RE | Admit: 2023-06-08 | Discharge: 2023-06-08 | Disposition: A | Discharge status: Home / Self Care | Payer: Medicare PPO | Source: Ambulatory Visit | Attending: Student | Admitting: Student

## 2023-06-08 DIAGNOSIS — I340 Nonrheumatic mitral (valve) insufficiency: Secondary | ICD-10-CM | POA: Insufficient documentation

## 2023-06-08 DIAGNOSIS — M62838 Other muscle spasm: Secondary | ICD-10-CM | POA: Diagnosis not present

## 2023-06-08 DIAGNOSIS — M542 Cervicalgia: Secondary | ICD-10-CM | POA: Diagnosis not present

## 2023-06-09 ENCOUNTER — Ambulatory Visit: Payer: Medicare PPO

## 2023-06-09 ENCOUNTER — Telehealth: Payer: Self-pay | Admitting: Family Medicine

## 2023-06-09 DIAGNOSIS — L820 Inflamed seborrheic keratosis: Secondary | ICD-10-CM | POA: Diagnosis not present

## 2023-06-09 LAB — ECHOCARDIOGRAM COMPLETE
AR max vel: 2.21 cm2
AV Area VTI: 2.17 cm2
AV Area mean vel: 1.99 cm2
AV Mean grad: 10 mm[Hg]
AV Peak grad: 14.5 mm[Hg]
Ao pk vel: 1.91 m/s
Area-P 1/2: 2.68 cm2
Calc EF: 60.9 %
MV M vel: 4.9 m/s
MV Peak grad: 96 mm[Hg]
S' Lateral: 0.6 cm
Single Plane A2C EF: 54.6 %
Single Plane A4C EF: 66.1 %

## 2023-06-09 NOTE — Telephone Encounter (Signed)
Called HH left detailed message of PCP ok per request.

## 2023-06-09 NOTE — Telephone Encounter (Signed)
Caller/Agency: [facility]) Callback Number: [phone], ok to [facility] Requesting OT/PT/Skilled Nursing/Social Work/Speech Therapy: OT Frequency: 1 w 8, starting 10.28.24

## 2023-06-10 DIAGNOSIS — D631 Anemia in chronic kidney disease: Secondary | ICD-10-CM | POA: Diagnosis not present

## 2023-06-10 DIAGNOSIS — M353 Polymyalgia rheumatica: Secondary | ICD-10-CM | POA: Diagnosis not present

## 2023-06-10 DIAGNOSIS — I340 Nonrheumatic mitral (valve) insufficiency: Secondary | ICD-10-CM | POA: Diagnosis not present

## 2023-06-10 DIAGNOSIS — I447 Left bundle-branch block, unspecified: Secondary | ICD-10-CM | POA: Diagnosis not present

## 2023-06-10 DIAGNOSIS — F319 Bipolar disorder, unspecified: Secondary | ICD-10-CM | POA: Diagnosis not present

## 2023-06-10 DIAGNOSIS — N1831 Chronic kidney disease, stage 3a: Secondary | ICD-10-CM | POA: Diagnosis not present

## 2023-06-10 DIAGNOSIS — D803 Selective deficiency of immunoglobulin G [IgG] subclasses: Secondary | ICD-10-CM | POA: Diagnosis not present

## 2023-06-10 DIAGNOSIS — E039 Hypothyroidism, unspecified: Secondary | ICD-10-CM | POA: Diagnosis not present

## 2023-06-10 DIAGNOSIS — I951 Orthostatic hypotension: Secondary | ICD-10-CM | POA: Diagnosis not present

## 2023-06-11 ENCOUNTER — Encounter (HOSPITAL_BASED_OUTPATIENT_CLINIC_OR_DEPARTMENT_OTHER): Payer: Medicare PPO | Admitting: Physical Medicine and Rehabilitation

## 2023-06-11 DIAGNOSIS — M62838 Other muscle spasm: Secondary | ICD-10-CM

## 2023-06-11 DIAGNOSIS — R52 Pain, unspecified: Secondary | ICD-10-CM

## 2023-06-11 DIAGNOSIS — K589 Irritable bowel syndrome without diarrhea: Secondary | ICD-10-CM

## 2023-06-11 NOTE — Progress Notes (Signed)
p  Subjective:    Patient ID: Megan Brewer, female    DOB: 06-Sep-1945, 77 y.o.   MRN: [id number]  HPI 1) Diffuse pain, has been attributed to polymyalgia rheumatica Megan Brewer is a 77 year old woman who presents to establish care for diffuse pain.  -it is present in her wrists, clavicles, left hip -the prednisone helps to control the pain but it only lasts 6 weeks and by the times she could go for the second infusion it was bad.  -she will be seeing her rheumatologist in a few weeks and would be very grateful if he were able to increased the frequency to q6 weeks -has been pretty good  -The Iv prednisone helped a great deal for some time.  -she has f/u with rheumatology -they have tried high doses of prednisone, low does hydrocodone, low doses Tramadol, lidocaine patches. Nothing has been helpful yet -she thinks the pain started after she spent 1 month at [facility] for sepsis and C diff. She left the hospital on June 1st. On June 4th she started with breathing difficulties, shaking, and some pain. It gradually got worse.  -her husband accompanies her today -steroids did not help leg pain -interested in cervical traction device -she asked her rheumatologist if she can gradually reduce the amount of steroid- they are talking about the IV steroids, she does not take any oral steroids -discussed that changes in weather exacerbate her pain  -she asks about results of food sensitivity testing  2) Shortness of breath -breathing without pain for the first time! -had been present July 2022 to July 2023 -she also feels a deflated rubber tire on her chest -she has been told her heart and lungs are fine -the steroids may have helped her to breath better -at the hospital where she was diagnosed she got a massive IV dose of steroids and this cleared up her lungs  3) Muscle spasms: -she has tried flexeril in the past with benefit -she recently tried robaxin and this caused severe symptomatic  hypotension requiring her to go to the ED  4) Hypotension: -fluids help -occurs suddenly -she is taking the midodrine and florinef -she takes midodrine at 7am/7pm  5) Fluid retention: -she is on the flomax for this   6) Movement disorder -was sent to neurologist at [facility]  7) family stress -husband has been limiting in walking due to need for removal of spinal mass -she has a visiting angel who has been helpful to her in the interim    Pain Inventory Average Pain  5 Pain Right Now 8 My pain is intermittent, constant, sharp, burning, stabbing, and aching  In the last 24 hours, has pain interfered with the following? General activity 10 Relation with others 10 Enjoyment of life 10 What TIME of day is your pain at its worst? morning , daytime, evening, and night Sleep (in general) Poor  Pain is worse with: unsure Pain improves with: rest Relief from Meds: little    New pt    Family History  Problem Relation Age of Onset   Hypertension Mother    GER disease Mother    Dementia Mother    Osteoporosis Mother    Arthritis Mother    Bipolar disorder Mother    Parkinson's disease Father    Congestive Heart Failure Father    Pneumonia Father    Arthritis Father    Dementia Father    Depression Father    Asthma Sister  Depression Sister    Bipolar disorder Brother    Asthma Daughter    Colon cancer Neg Hx    Esophageal cancer Neg Hx    Stomach cancer Neg Hx    Rectal cancer Neg Hx    Social History   Socioeconomic History   Marital status: Married    Spouse name: Not on file   Number of children: 2   Years of education: Not on file   Highest education level: GED or equivalent  Occupational History   Occupation: retired  Tobacco Use   Smoking status: Never   Smokeless tobacco: Never  Vaping Use   Vaping status: Never Used  Substance and Sexual Activity   Alcohol use: Not Currently   Drug use: Never   Sexual activity: Not on file  Other  Topics Concern   Not on file  Social History Narrative   Diet: None      Caffeine: coffee, tea, sodas less than or 1 daily.      Married, if yes what year: Yes, 1972      Do you live in a house, apartment, assisted living, condo, trailer, ect: Two story house       Pets: None      Current/Past profession: Teach      Exercise: None         Living Will: No   DNR: No   POA/HPOA: No      Functional Status:   Do you have difficulty bathing or dressing yourself? yes   Do you have difficulty preparing food or eating? yes   Do you have difficulty managing your medications? yes   Do you have difficulty managing your finances?yes   Do you have difficulty affording your medications? No      Right Handed    Lives in a two story home    Social Determinants of Health   Financial Resource Strain: Low Risk  (12/16/2022)   Received from [facility], [facility]   Overall Financial Resource Strain (CARDIA)    Difficulty of Paying Living Expenses: Not hard at all  Food Insecurity: No Food Insecurity (12/25/2022)   Hunger Vital Sign    Worried About Running Out of Food in the Last Year: Never true    Ran Out of Food in the Last Year: Never true  Transportation Needs: No Transportation Needs (12/25/2022)   PRAPARE - Administrator, Civil Service (Medical): No    Lack of Transportation (Non-Medical): No  Physical Activity: Inactive (03/19/2023)   Exercise Vital Sign    Days of Exercise per Week: 0 days    Minutes of Exercise per Session: 0 min  Stress: No Stress Concern Present (03/19/2023)   [employer] of Occupational Health - Occupational Stress Questionnaire    Feeling of Stress : Not at all  Social Connections: Socially Isolated (03/19/2023)   Social Connection and Isolation Panel [NHANES]    Frequency of Communication with Friends and Family: Once a week    Frequency of Social Gatherings with Friends and Family: Once a week     Attends Religious Services: Never    Database administrator or Organizations: No    Attends Engineer, structural: Never    Marital Status: Married   Past Surgical History:  Procedure Laterality Date   ABDOMINAL HYSTERECTOMY     CARPAL TUNNEL RELEASE     CATARACT EXTRACTION Bilateral 2019   CHOLECYSTECTOMY     COLONOSCOPY  2015   [facility]   CT LUNG SCREENING  2018   DENTAL SURGERY  06/17/2021   4 teeth rmoved   DG  BONE DENSITY ([facility] HX)  2018   DIAGNOSTIC MAMMOGRAM  2019   GASTRIC BYPASS     LUNG CANCER SURGERY  02/2010   MULTIPLE TOOTH EXTRACTIONS     PORT A CATH REVISION Right 04/2021   PORT-A-CATH REMOVAL Left 02/07/2021   Procedure: REMOVAL PORT-A-CATH;  Surgeon: Rodman Pickle, MD;  Location: [facility];  Service: General;  Laterality: Left;  60 MINUTES ROOM 4   SPINAL CORD STIMULATOR IMPLANT     Past Medical History:  Diagnosis Date   Anemia    Arachnoiditis    Bipolar disorder (HCC)    Cataract    removed   Chronic post-thoracotomy pain    CKD (chronic kidney disease)    GERD (gastroesophageal reflux disease)    Hypogammaglobulinemia (HCC)    Hypotension    Hypothyroid    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HCC)    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HCC)    Lung cancer (HCC) 2011, 2014   Memory loss    Migraine    Opioid abuse (HCC)    Osteoporosis    PMR (polymyalgia rheumatica) (HCC) 03/17/2022   Presence of neurostimulator    Restless legs    Sleep apnea    There were no vitals taken for this visit.  Opioid Risk Score:   Fall Risk Score:  `1  Depression screen PHQ 2/9     06/01/2023    1:23 PM 03/19/2023    9:48 AM 02/22/2023    2:24 PM 10/27/2022   11:10 AM 06/11/2022   10:09 AM 09/30/2021   11:02 AM 02/04/2021    3:28 PM  Depression screen PHQ 2/9  Decreased Interest 0 0 1 1 3  0 0  Down, Depressed, Hopeless 0 0 1 1 3  0 0  PHQ - 2 Score 0 0 2 2 6  0 0  Altered sleeping     3    Tired, decreased energy     3    Change in appetite     2     Feeling bad or failure about yourself      3    Trouble concentrating     0    Moving slowly or fidgety/restless     3    Suicidal thoughts     3    PHQ-9 Score     23    Difficult doing work/chores     Extremely dIfficult        Review of Systems  Respiratory:  Positive for shortness of breath.   Gastrointestinal:  Positive for constipation and nausea.  Musculoskeletal:  Positive for gait problem.       Spasms  Neurological:  Positive for dizziness, tremors and weakness.  Psychiatric/Behavioral:  Positive for dysphoric mood. The patient is nervous/anxious.   All other systems reviewed and are negative.      Objective:   Physical Exam Not performed    Assessment & Plan:   1) Chronic Pain Syndrome  with diffuse pain secondary to Polymylagia rheumatica -Discussed current symptoms of pain and history of pain.  -discussed that steroids have been helpful but she is discussing with her rheumatologist about tapering these -Discussed benefits of exercise in reducing pain. -discussed results of food sensitivity testing, discussed that highest response was to cheese mold>pork>milk and recommended 3 months avoidance of these foods to see if symptoms improve -  discussed that she was not breast fed as a child, she was born via a vaginal delivery -discussed that she was given a lot of antibiotics as a child.  -discussed that the only thing that has been helpful was IV steroids. Continue, recommended discussing with rheumatology increasing frequency to q6 weeks.  -discontinue cymbalta 20mg  daily.  -discussed that steroids are helping -continue ergocalciferol 50,000U  -will send message to her care team (Dr. Carmelia Roller PCP, Dr. Antoine Poche cardiologist, Dr. Isabelle Course rheumatologist) to discuss doing IV steroid treatment.  -will call her rheumatologist today to discuss Kevzara and Plaquenil.  -discussed her follow-up with rheumatology, who placed her on low dose prednisone 5mg  BID.  -Discussed  following foods that may reduce pain: 1) Ginger (especially studied for arthritis)- reduce leukotriene production to decrease inflammation 2) Blueberries- high in phytonutrients that decrease inflammation 3) Salmon- marine omega-3s reduce joint swelling and pain 4) Pumpkin seeds- reduce inflammation 5) dark chocolate- reduces inflammation 6) turmeric- reduces inflammation 7) tart cherries - reduce pain and stiffness 8) extra virgin olive oil - its compound olecanthal helps to block prostaglandins  9) chili peppers- can be eaten or applied topically via capsaicin 10) mint- helpful for headache, muscle aches, joint pain, and itching 11) garlic- reduces inflammation  Link to further information on diet for chronic pain: [email or web address].[email or web address]    2) Shortness of breath secondary to lung cancer -discussed that it started one year ago -discussed that cardio pulmonary workup has been normal -discussed that her IV steroids were very helpful.  -ordered pulmonary rehab  3) Muscle spasms: -prescribed robaxin  4) Cervicalgia: -.Prescribed Zynex Nexwave, cervical traction device -discussed trigger point injections   5) Movement disorder: -referred to [facility] Disorders clinic -discussed that she cannot do MRIs.   6) Muscle spasms: -fled/c flexeril -discussed her negative response to robaxin  7) Hypotension: -increase midodrine to 10mg  BID, continue this dose -continue fludrocortisone -discussed abdominal binder and teds -discussed that steroids can help -discussed that flomax can cause this, switch to night -encouraged adequate hydration  8) CKD: Cr monitored and Cr is stable.   9) Iron deficiency: -discussed dietary sources of iron such as red meat, green leafy vegetables, chickpeas  10) Impaired balance: -discussed her plan to start PT -discussed that she is walking more than she was,  discussed that she walked to the kitchen to get water -discussed that she has a visiting angel every day- discussed within an hour of her showing up she felt like they were sisters and they have a connection with each. Discussed that she goes to the grocery store for them  11) Movement disorder: -discussed that she is beng followed by neurology   12) IBS Discussed results of food sensitivity testing, discussed that highest response was to cheese mold>pork>milk and recommended 3 months avoidance of these foods to see if symptoms improve  6 minutes spent in discussion of results of food sensitivity testing, discussed that highest response was to cheese mold>pork>milk and recommended 3 months avoidance of these foods to see if symptoms improve

## 2023-06-16 DIAGNOSIS — I951 Orthostatic hypotension: Secondary | ICD-10-CM | POA: Diagnosis not present

## 2023-06-16 DIAGNOSIS — E039 Hypothyroidism, unspecified: Secondary | ICD-10-CM | POA: Diagnosis not present

## 2023-06-16 DIAGNOSIS — D803 Selective deficiency of immunoglobulin G [IgG] subclasses: Secondary | ICD-10-CM | POA: Diagnosis not present

## 2023-06-16 DIAGNOSIS — F319 Bipolar disorder, unspecified: Secondary | ICD-10-CM | POA: Diagnosis not present

## 2023-06-16 DIAGNOSIS — D631 Anemia in chronic kidney disease: Secondary | ICD-10-CM | POA: Diagnosis not present

## 2023-06-16 DIAGNOSIS — I447 Left bundle-branch block, unspecified: Secondary | ICD-10-CM | POA: Diagnosis not present

## 2023-06-16 DIAGNOSIS — M353 Polymyalgia rheumatica: Secondary | ICD-10-CM | POA: Diagnosis not present

## 2023-06-16 DIAGNOSIS — N1831 Chronic kidney disease, stage 3a: Secondary | ICD-10-CM | POA: Diagnosis not present

## 2023-06-16 DIAGNOSIS — I340 Nonrheumatic mitral (valve) insufficiency: Secondary | ICD-10-CM | POA: Diagnosis not present

## 2023-06-18 DIAGNOSIS — I951 Orthostatic hypotension: Secondary | ICD-10-CM | POA: Diagnosis not present

## 2023-06-18 DIAGNOSIS — I447 Left bundle-branch block, unspecified: Secondary | ICD-10-CM | POA: Diagnosis not present

## 2023-06-18 DIAGNOSIS — D631 Anemia in chronic kidney disease: Secondary | ICD-10-CM | POA: Diagnosis not present

## 2023-06-18 DIAGNOSIS — D803 Selective deficiency of immunoglobulin G [IgG] subclasses: Secondary | ICD-10-CM | POA: Diagnosis not present

## 2023-06-18 DIAGNOSIS — E039 Hypothyroidism, unspecified: Secondary | ICD-10-CM | POA: Diagnosis not present

## 2023-06-18 DIAGNOSIS — I340 Nonrheumatic mitral (valve) insufficiency: Secondary | ICD-10-CM | POA: Diagnosis not present

## 2023-06-18 DIAGNOSIS — F319 Bipolar disorder, unspecified: Secondary | ICD-10-CM | POA: Diagnosis not present

## 2023-06-18 DIAGNOSIS — N1831 Chronic kidney disease, stage 3a: Secondary | ICD-10-CM | POA: Diagnosis not present

## 2023-06-18 DIAGNOSIS — M353 Polymyalgia rheumatica: Secondary | ICD-10-CM | POA: Diagnosis not present

## 2023-06-21 ENCOUNTER — Other Ambulatory Visit: Payer: Self-pay | Admitting: Physician Assistant

## 2023-06-22 ENCOUNTER — Other Ambulatory Visit (HOSPITAL_BASED_OUTPATIENT_CLINIC_OR_DEPARTMENT_OTHER): Payer: Self-pay

## 2023-06-22 DIAGNOSIS — I447 Left bundle-branch block, unspecified: Secondary | ICD-10-CM | POA: Diagnosis not present

## 2023-06-22 DIAGNOSIS — F319 Bipolar disorder, unspecified: Secondary | ICD-10-CM | POA: Diagnosis not present

## 2023-06-22 DIAGNOSIS — M353 Polymyalgia rheumatica: Secondary | ICD-10-CM | POA: Diagnosis not present

## 2023-06-22 DIAGNOSIS — E039 Hypothyroidism, unspecified: Secondary | ICD-10-CM | POA: Diagnosis not present

## 2023-06-22 DIAGNOSIS — I951 Orthostatic hypotension: Secondary | ICD-10-CM | POA: Diagnosis not present

## 2023-06-22 DIAGNOSIS — N1831 Chronic kidney disease, stage 3a: Secondary | ICD-10-CM | POA: Diagnosis not present

## 2023-06-22 DIAGNOSIS — D631 Anemia in chronic kidney disease: Secondary | ICD-10-CM | POA: Diagnosis not present

## 2023-06-22 DIAGNOSIS — D803 Selective deficiency of immunoglobulin G [IgG] subclasses: Secondary | ICD-10-CM | POA: Diagnosis not present

## 2023-06-22 DIAGNOSIS — I340 Nonrheumatic mitral (valve) insufficiency: Secondary | ICD-10-CM | POA: Diagnosis not present

## 2023-06-23 ENCOUNTER — Other Ambulatory Visit (HOSPITAL_BASED_OUTPATIENT_CLINIC_OR_DEPARTMENT_OTHER): Payer: Self-pay

## 2023-06-24 ENCOUNTER — Other Ambulatory Visit: Payer: Self-pay

## 2023-06-24 ENCOUNTER — Ambulatory Visit: Payer: Medicare PPO

## 2023-06-24 VITALS — BP 104/58 | HR 69 | Temp 97.8°F | Resp 16 | Ht 64.0 in | Wt 139.0 lb

## 2023-06-24 DIAGNOSIS — M2550 Pain in unspecified joint: Secondary | ICD-10-CM | POA: Diagnosis not present

## 2023-06-24 DIAGNOSIS — M353 Polymyalgia rheumatica: Secondary | ICD-10-CM | POA: Diagnosis not present

## 2023-06-24 DIAGNOSIS — M810 Age-related osteoporosis without current pathological fracture: Secondary | ICD-10-CM

## 2023-06-24 MED ORDER — DENOSUMAB 60 MG/ML ~~LOC~~ SOSY: 60.0000 mg | PREFILLED_SYRINGE | Freq: Once | SUBCUTANEOUS | Status: DC

## 2023-06-24 MED ORDER — SODIUM CHLORIDE 0.9 % IV SOLN
1000.0000 mg | Freq: Once | INTRAVENOUS | Status: AC
  Administered 2023-06-24: 1000 mg via INTRAVENOUS
  Filled 2023-06-24: qty 16

## 2023-06-24 MED ORDER — HEPARIN SOD (PORK) LOCK FLUSH 100 UNIT/ML IV SOLN
500.0000 [IU] | Freq: Once | INTRAVENOUS | Status: AC | PRN
Start: 2023-06-24 — End: 2023-06-24
  Administered 2023-06-24: 500 [IU]

## 2023-06-24 NOTE — Progress Notes (Cosign Needed)
Diagnosis: Polyarthralgia  Provider:  Chilton Greathouse MD  Procedure: IV Infusion  IV Type: Port a Cath, IV Location: R Chest  Solumedrol (Methylprednisolone), Dose: 1000 mg  Infusion Start Time: 1502  Infusion Stop Time: 1610  Post Infusion IV Care: Peripheral IV Discontinued  Discharge: Condition: Good, Destination: Home . AVS Provided  Performed by:  Adriana Mccallum, RN

## 2023-06-24 NOTE — Telephone Encounter (Signed)
CAM ordered

## 2023-06-25 DIAGNOSIS — I447 Left bundle-branch block, unspecified: Secondary | ICD-10-CM | POA: Diagnosis not present

## 2023-06-25 DIAGNOSIS — I340 Nonrheumatic mitral (valve) insufficiency: Secondary | ICD-10-CM | POA: Diagnosis not present

## 2023-06-25 DIAGNOSIS — D631 Anemia in chronic kidney disease: Secondary | ICD-10-CM | POA: Diagnosis not present

## 2023-06-25 DIAGNOSIS — I951 Orthostatic hypotension: Secondary | ICD-10-CM | POA: Diagnosis not present

## 2023-06-25 DIAGNOSIS — D803 Selective deficiency of immunoglobulin G [IgG] subclasses: Secondary | ICD-10-CM | POA: Diagnosis not present

## 2023-06-25 DIAGNOSIS — F319 Bipolar disorder, unspecified: Secondary | ICD-10-CM | POA: Diagnosis not present

## 2023-06-25 DIAGNOSIS — N1831 Chronic kidney disease, stage 3a: Secondary | ICD-10-CM | POA: Diagnosis not present

## 2023-06-25 DIAGNOSIS — E039 Hypothyroidism, unspecified: Secondary | ICD-10-CM | POA: Diagnosis not present

## 2023-06-25 DIAGNOSIS — M353 Polymyalgia rheumatica: Secondary | ICD-10-CM | POA: Diagnosis not present

## 2023-06-26 ENCOUNTER — Emergency Department (HOSPITAL_COMMUNITY): Payer: Medicare PPO

## 2023-06-26 ENCOUNTER — Encounter (HOSPITAL_COMMUNITY): Payer: Self-pay

## 2023-06-26 ENCOUNTER — Other Ambulatory Visit: Payer: Self-pay

## 2023-06-26 ENCOUNTER — Emergency Department (HOSPITAL_COMMUNITY)
Admission: EM | Admit: 2023-06-26 | Discharge: 2023-06-26 | Disposition: A | Discharge status: Home / Self Care | Payer: Medicare PPO | Attending: Emergency Medicine | Admitting: Emergency Medicine

## 2023-06-26 DIAGNOSIS — I129 Hypertensive chronic kidney disease with stage 1 through stage 4 chronic kidney disease, or unspecified chronic kidney disease: Secondary | ICD-10-CM | POA: Insufficient documentation

## 2023-06-26 DIAGNOSIS — N189 Chronic kidney disease, unspecified: Secondary | ICD-10-CM | POA: Diagnosis not present

## 2023-06-26 DIAGNOSIS — I447 Left bundle-branch block, unspecified: Secondary | ICD-10-CM | POA: Diagnosis not present

## 2023-06-26 DIAGNOSIS — R1032 Left lower quadrant pain: Secondary | ICD-10-CM | POA: Insufficient documentation

## 2023-06-26 DIAGNOSIS — K838 Other specified diseases of biliary tract: Secondary | ICD-10-CM | POA: Diagnosis not present

## 2023-06-26 DIAGNOSIS — I959 Hypotension, unspecified: Secondary | ICD-10-CM | POA: Diagnosis not present

## 2023-06-26 DIAGNOSIS — Z79899 Other long term (current) drug therapy: Secondary | ICD-10-CM | POA: Insufficient documentation

## 2023-06-26 DIAGNOSIS — R1084 Generalized abdominal pain: Secondary | ICD-10-CM | POA: Diagnosis not present

## 2023-06-26 DIAGNOSIS — I10 Essential (primary) hypertension: Secondary | ICD-10-CM | POA: Diagnosis not present

## 2023-06-26 DIAGNOSIS — R109 Unspecified abdominal pain: Secondary | ICD-10-CM | POA: Diagnosis not present

## 2023-06-26 DIAGNOSIS — E039 Hypothyroidism, unspecified: Secondary | ICD-10-CM | POA: Insufficient documentation

## 2023-06-26 LAB — CBC WITH DIFFERENTIAL/PLATELET
Abs Immature Granulocytes: 0.02 10*3/uL (ref 0.00–0.07)
Basophils Absolute: 0.1 10*3/uL (ref 0.0–0.1)
Basophils Relative: 1 %
Eosinophils Absolute: 0 10*3/uL (ref 0.0–0.5)
Eosinophils Relative: 1 %
HCT: 40.2 % (ref 36.0–46.0)
Hemoglobin: 13.2 g/dL (ref 12.0–15.0)
Immature Granulocytes: 0 %
Lymphocytes Relative: 25 %
Lymphs Abs: 1.5 10*3/uL (ref 0.7–4.0)
MCH: 31 pg (ref 26.0–34.0)
MCHC: 32.8 g/dL (ref 30.0–36.0)
MCV: 94.4 fL (ref 80.0–100.0)
Monocytes Absolute: 0.5 10*3/uL (ref 0.1–1.0)
Monocytes Relative: 8 %
Neutro Abs: 3.9 10*3/uL (ref 1.7–7.7)
Neutrophils Relative %: 65 %
Platelets: 156 10*3/uL (ref 150–400)
RBC: 4.26 MIL/uL (ref 3.87–5.11)
RDW: 12.6 % (ref 11.5–15.5)
WBC: 6 10*3/uL (ref 4.0–10.5)
nRBC: 0 % (ref 0.0–0.2)

## 2023-06-26 LAB — COMPREHENSIVE METABOLIC PANEL
ALT: 66 U/L — ABNORMAL HIGH (ref 0–44)
AST: 58 U/L — ABNORMAL HIGH (ref 15–41)
Albumin: 3 g/dL — ABNORMAL LOW (ref 3.5–5.0)
Alkaline Phosphatase: 54 U/L (ref 38–126)
Anion gap: 9 (ref 5–15)
BUN: 40 mg/dL — ABNORMAL HIGH (ref 8–23)
CO2: 24 mmol/L (ref 22–32)
Calcium: 9.8 mg/dL (ref 8.9–10.3)
Chloride: 105 mmol/L (ref 98–111)
Creatinine, Ser: 1.46 mg/dL — ABNORMAL HIGH (ref 0.44–1.00)
GFR, Estimated: 38 mL/min — ABNORMAL LOW (ref >60)
Glucose, Bld: 105 mg/dL — ABNORMAL HIGH (ref 70–99)
Potassium: 4 mmol/L (ref 3.5–5.1)
Sodium: 138 mmol/L (ref 135–145)
Total Bilirubin: 0.2 mg/dL (ref <1.2)
Total Protein: 6.4 g/dL — ABNORMAL LOW (ref 6.5–8.1)

## 2023-06-26 LAB — URINALYSIS, ROUTINE W REFLEX MICROSCOPIC
Bilirubin Urine: NEGATIVE
Glucose, UA: NEGATIVE mg/dL
Hgb urine dipstick: NEGATIVE
Ketones, ur: NEGATIVE mg/dL
Nitrite: NEGATIVE
Protein, ur: NEGATIVE mg/dL
Specific Gravity, Urine: 1.017 (ref 1.005–1.030)
pH: 5 (ref 5.0–8.0)

## 2023-06-26 LAB — LIPASE, BLOOD: Lipase: 69 U/L — ABNORMAL HIGH (ref 11–51)

## 2023-06-26 MED ORDER — FENTANYL CITRATE PF 50 MCG/ML IJ SOSY
50.0000 ug | PREFILLED_SYRINGE | Freq: Once | INTRAMUSCULAR | Status: AC
  Administered 2023-06-26: 50 ug via INTRAVENOUS
  Filled 2023-06-26: qty 1

## 2023-06-26 MED ORDER — ONDANSETRON HCL 4 MG/2ML IJ SOLN
4.0000 mg | Freq: Once | INTRAMUSCULAR | Status: AC
  Administered 2023-06-26: 4 mg via INTRAVENOUS
  Filled 2023-06-26: qty 2

## 2023-06-26 MED ORDER — IOHEXOL 300 MG/ML  SOLN
80.0000 mL | Freq: Once | INTRAMUSCULAR | Status: AC | PRN
  Administered 2023-06-26: 80 mL via INTRAVENOUS

## 2023-06-26 MED ORDER — DICYCLOMINE HCL 20 MG PO TABS: 20.0000 mg | ORAL_TABLET | Freq: Two times a day (BID) | ORAL | 0 refills | Status: DC

## 2023-06-26 MED ORDER — DICYCLOMINE HCL 10 MG PO CAPS
10.0000 mg | ORAL_CAPSULE | Freq: Once | ORAL | Status: AC
  Administered 2023-06-26: 10 mg via ORAL
  Filled 2023-06-26: qty 1

## 2023-06-26 MED ORDER — HEPARIN SOD (PORK) LOCK FLUSH 100 UNIT/ML IV SOLN
500.0000 [IU] | Freq: Once | INTRAVENOUS | Status: AC
  Administered 2023-06-26: 500 [IU] via INTRAVENOUS
  Filled 2023-06-26: qty 5

## 2023-06-26 NOTE — Discharge Instructions (Addendum)
While you are in the emergency department, you had labs done which were within your normal range.  You had a CT scan done of your abdomen and pelvis that was normal.  At this time I do not have a clear cause for your abdominal pain.  I am recommending that you take Bentyl up to 2 times per day which can sometimes help with this discomfort.  I would like you to follow-up with your primary care doctor on Monday.  Come back to the emergency department if your pain worsens, or is not better by Monday.  Coming back to the emergency department if you are unable to eat or drink.

## 2023-06-26 NOTE — ED Notes (Signed)
Porta cath [employer]

## 2023-06-26 NOTE — ED Provider Triage Note (Signed)
Emergency Medicine Provider Triage Evaluation Note  Megan Brewer , a 77 y.o. female  was evaluated in triage.  Pt complains of left lower quadrant abdominal pain that started at 6 AM.  No vomiting or diarrhea.  Review of Systems    Physical Exam  BP (!) 85/52 (BP Location: Right Arm)   Pulse (!) 101   Temp 97.6 F (36.4 C) (Oral)   Resp 20   SpO2 99%  Gen:   Awake, uncomfortable appearing Resp:  Normal effort  MSK:   Moves extremities without difficulty  Other:  Abdomen-left lower quadrant tenderness.  No rigidity.  Medical Decision Making  Medically screening exam initiated at 1:48 PM.  Appropriate orders placed.  Megan Brewer was informed that the remainder of the evaluation will be completed by another provider, this initial triage assessment does not replace that evaluation, and the importance of remaining in the ED until their evaluation is complete.  Left lower quadrant abdominal pain.  Basic labs, imaging ordered.  Patient's blood pressure on the softer side, mentating without difficulty.  Recommended the patient get a room.   Megan Simmonds T, DO 06/26/23 1348

## 2023-06-26 NOTE — ED Triage Notes (Addendum)
Pt from home with ems for orthostatic hypotension. (70/50) hx of same Pt also c.o LUQ abd pain since 6am.  EKG showing LBBB with LVH.   Pt states she took 1 midodrine this morning, due for her second dose at 3pm

## 2023-06-26 NOTE — ED Notes (Signed)
Not in the room 

## 2023-06-26 NOTE — ED Provider Notes (Signed)
[place] EMERGENCY DEPARTMENT AT [facility] Provider Note  CSN: [id number] Arrival date & time: 06/26/23 1338  Chief Complaint(s) Abdominal Pain and Hypotension  HPI Megan Brewer is a 77 y.o. female here today for left lower quadrant abdominal pain.  Patient says this has been ongoing for about 3 weeks, but suddenly worsened this morning at 6 AM.  She has not seen a doctor for this.  She has a history of orthostatic hypotension, takes midodrine.  Has a history of gastric bypass 20 years ago.  Past Medical History Past Medical History:  Diagnosis Date   Anemia    Arachnoiditis    Bipolar disorder (HCC)    Cataract    removed   Chronic post-thoracotomy pain    CKD (chronic kidney disease)    GERD (gastroesophageal reflux disease)    Hypogammaglobulinemia (HCC)    Hypotension    Hypothyroid    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HCC)    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HCC)    Lung cancer (HCC) 2011, 2014   Memory loss    Migraine    Opioid abuse (HCC)    Osteoporosis    PMR (polymyalgia rheumatica) (HCC) 03/17/2022   Presence of neurostimulator    Restless legs    Sleep apnea    Patient Active Problem List   Diagnosis Date Noted   Orthostatic hypotension 12/22/2022   Near syncope 12/22/2022   Bipolar mixed affective disorder, moderate (HCC) 08/25/2022   Tremor 08/25/2022   Polyarthralgia 05/04/2022   Polymyalgia rheumatica (HCC) 03/25/2022   Thrombocytopenia, unspecified (HCC) 12/30/2021   Upper airway cough syndrome with ? VCD  06/26/2021   Precordial chest pain 06/05/2021   Moderate mitral regurgitation 06/05/2021   Essential hypertension 06/05/2021   DOE (dyspnea on exertion) 06/05/2021   Medication monitoring encounter 04/01/2021   Gram-negative bacteremia 03/03/2021   Rigors 02/04/2021   Elevated troponin 01/14/2021   Hypokalemia 01/14/2021   Headache 01/14/2021   Chest discomfort 03/22/2019   Weight loss 01/27/2019   Acquired  hypothyroidism 01/27/2019   Esophagitis 01/27/2019   Intractable migraine without status migrainosus 01/27/2019   Osteoporosis 01/27/2019   Chronic sinusitis 12/28/2018   Food intolerance/GI symptoms 12/28/2018   Acute maxillary sinusitis 12/28/2018   Lower leg edema 12/20/2018   Chronic pain of both shoulders 05/24/2018   Bilateral leg edema 05/24/2018   Chronic pain syndrome 03/31/2018   Left bundle branch block 10/28/2017   Palpitations 10/28/2017   History of gastric bypass 05/21/2017   Polypharmacy 02/11/2017   Senile nuclear sclerosis 11/26/2016   Chronic post-thoracotomy pain 04/09/2014   Chronic kidney disease 04/09/2014   Sleep disturbances 09/20/2012   Restless legs syndrome 07/07/2012   Hypertonicity of bladder 05/23/2012   Essential tremor 04/01/2012   Memory loss 04/01/2012   Incomplete emptying of bladder 01/14/2012   Urge incontinence 10/28/2011   Tension type headache 06/10/2011   Mixed urge and stress incontinence 05/28/2011   Hypogammaglobulinemia (HCC) 06/22/2010   Severe episode of recurrent major depressive disorder, without psychotic features (HCC) 06/22/2010   Hypothyroidism 02/12/2010   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/2010   Meningitis 01/31/2010   OBSTRUCTIVE SLEEP APNEA 01/31/2010   Chronic rhinitis 01/31/2010   Arachnoiditis 01/31/2010   Home Medication(s) Prior to Admission medications   Medication Sig Start Date End Date Taking? Authorizing Provider  acetaminophen (TYLENOL) 500 MG tablet Take 1,000 mg by mouth as needed for moderate pain.    [provider]  azelastine (ASTELIN) 0.1 % nasal spray Place 2 sprays  into both nostrils 2 (two) times daily. 03/10/21   Hetty Blend, FNP  baclofen (LIORESAL) 10 MG tablet Take 10 mg by mouth daily as needed for muscle spasms. 06/03/20   [provider]  CALCIUM PO Take 6 tablets by mouth daily.    [provider]  cholecalciferol (VITAMIN D3) 25 MCG (1000 UNIT) tablet  Take 1,000 Units by mouth daily.    [provider]  cycloSPORINE (RESTASIS) 0.05 % ophthalmic emulsion Place 1 drop into both eyes in the morning, at noon, in the evening, and at bedtime.    [provider]  denosumab (PROLIA) 60 MG/ML SOSY injection Pt to get at office.  Appt is 02/10/23 01/19/23   Sharlene Dory, DO  dexlansoprazole (DEXILANT) 60 MG capsule Take 1 capsule (60 mg total) by mouth daily. Patient needs follow up appointment for future refills. Please call [phone] to schedule an appointment. 05/07/23   Esterwood, Amy S, PA-C  famotidine (PEPCID) 20 MG tablet Take 1 tablet (20 mg total) by mouth at bedtime. Patient needs follow up appointment for future refills. Please call [phone] to schedule an appointment. 06/21/23   Esterwood, Amy S, PA-C  ferrous sulfate 325 (65 FE) MG tablet Take 650 mg by mouth daily with breakfast.    [provider]  fludrocortisone (FLORINEF) 0.1 MG tablet Take 0.1 mg by mouth daily.    [provider]  GAMMAGARD 20 GM/200ML SOLN Inject 40 g into the vein every 21 ( twenty-one) days. INFUSE 40G INTRAVENOUSLY EVERY 3 WEEKS 02/27/22   [provider]  heparin lock flush 100 UNIT/ML SOLN injection Inject 500 Units into the vein every 21 ( twenty-one) days. 03/31/21   [provider]  ibuprofen (ADVIL) 200 MG tablet Take 600 mg by mouth as needed for mild pain.    [provider]  KEVZARA 200 MG/1. SOAJ Inject 1.14 mLs into the skin every 14 (fourteen) days. 09/09/22   [provider]  levothyroxine (SYNTHROID) 75 MCG tablet TAKE ONE (1) TABLET BY MOUTH EACH DAY BEFORE BREAKFAST ON EMPTY STOMACH 04/07/23   Sharlene Dory, DO  magnesium oxide (MAG-OX) 400 MG tablet Take 400 mg by mouth daily.     [provider]  midodrine (PROAMATINE) 10 MG tablet Take 1 tablet (10 mg total) by mouth 3 (three) times daily. 02/24/23   Wendling, Jilda Roche, DO  MYRBETRIQ  50 MG TB24 tablet Take 50 mg by mouth daily. 10/07/22   [provider]  ondansetron (ZOFRAN) 8 MG tablet TAKE ONE TABLET BY MOUTH EVERY EIGHT HOURS AS NEEDED FOR NAUSEA OR VOMITING 02/15/23   Wendling, Jilda Roche, DO  oxyCODONE (ROXICODONE) 5 MG immediate release tablet Take 0.5-1 tablets (2.5-5 mg total) by mouth every 4 (four) hours as needed for severe pain. 04/27/23   Wendling, Jilda Roche, DO  potassium chloride (KLOR-CON) 10 MEQ tablet TAKE ONE (1) TABLET BY MOUTH FOUR (4) TIMES DAILY 04/26/23   Wendling, Jilda Roche, DO  pregabalin (LYRICA) 50 MG capsule Take 1 capsule (50 mg total) by mouth 3 (three) times daily. 04/07/23   Sharlene Dory, DO  prochlorperazine (COMPAZINE) 10 MG tablet Take 1 tablet (10 mg total) by mouth 2 (two) times daily as needed for nausea or vomiting. 04/05/23   Arthor Captain, PA-C  pyridoxine (B-6) 100 MG tablet Take 100 mg by mouth daily.    Sharlene Dory, DO  rOPINIRole (REQUIP) 0.5 MG tablet Take 0.5 mg by mouth 3 (three) times  daily.    Sharlene Dory, DO  sucralfate (CARAFATE) 1 GM/10ML suspension TAKE 10 MLS (1 G TOTAL) BY MOUTH 4 TIMESDAILY AS NEEDED 01/25/23   Sharlene Dory, DO  tamsulosin (FLOMAX) 0.4 MG CAPS capsule Take 1 capsule (0.4 mg total) by mouth daily. 01/23/21   Burnadette Pop, MD  torsemide 40 MG TABS Take 20 mg by mouth 2 (two) times daily. 03/31/23   Carlos Levering, NP  triamcinolone cream (KENALOG) 0.1 % Apply 1 application  topically 2 (two) times daily as needed (rash).    [provider]  vitamin B-12 (CYANOCOBALAMIN) 1000 MCG tablet Take 1,000 mcg by mouth daily. 02/14/21   [provider]  Vitamin D, Ergocalciferol, (DRISDOL) 1.25 MG (50000 UNIT) CAPS capsule TAKE 1 CAPSULE ONCE WEEKLY 01/04/23   [name], Drema Pry, MD  ZINC OXIDE, TOPICAL, 10 % CREA Apply 1 application topically 2 (two) times daily as needed (rash). 02/14/21   Glade Lloyd, MD                                                                                                                                     Past Surgical History Past Surgical History:  Procedure Laterality Date   ABDOMINAL HYSTERECTOMY     CARPAL TUNNEL RELEASE     CATARACT EXTRACTION Bilateral 2019   CHOLECYSTECTOMY     COLONOSCOPY  2015   [facility]   CT LUNG SCREENING  2018   DENTAL SURGERY  06/17/2021   4 teeth rmoved   DG  BONE DENSITY ([facility] HX)  2018   DIAGNOSTIC MAMMOGRAM  2019   GASTRIC BYPASS     LUNG CANCER SURGERY  02/2010   MULTIPLE TOOTH EXTRACTIONS     PORT A CATH REVISION Right 04/2021   PORT-A-CATH REMOVAL Left 02/07/2021   Procedure: REMOVAL PORT-A-CATH;  Surgeon: Kinsinger, De Blanch, MD;  Location: [facility];  Service: General;  Laterality: Left;  60 MINUTES ROOM 4   SPINAL CORD STIMULATOR IMPLANT     Family History Family History  Problem Relation Age of Onset   Hypertension Mother    GER disease Mother    Dementia Mother    Osteoporosis Mother    Arthritis Mother    Bipolar disorder Mother    Parkinson's disease Father    Congestive Heart Failure Father    Pneumonia Father    Arthritis Father    Dementia Father    Depression Father    Asthma Sister    Depression Sister    Bipolar disorder Brother    Asthma Daughter    Colon cancer Neg Hx    Esophageal cancer Neg Hx    Stomach cancer Neg Hx    Rectal cancer Neg Hx     Social History Social History   Tobacco Use   Smoking status: Never   Smokeless tobacco: Never  Vaping Use   Vaping status: Never Used  Substance Use  Topics   Alcohol use: Not Currently   Drug use: Never   Allergies Butorphanol, Erythromycin base, Pregabalin, Pregabalin er, Propoxyphene, Sumatriptan, Tetracycline, Ciprofloxacin, Corticosteroids, Doxycycline, Cefixime, Gabapentin, Isometheptene-dichloral-apap, Ketorolac, Prednisone, Promethazine, and Tape  Review of Systems Review of Systems  Physical Exam Vital Signs  I have reviewed the triage vital signs BP  90/78 (BP Location: Right Arm)   Pulse (!) 101   Temp 97.6 F (36.4 C) (Oral)   Resp 20   Ht 5\' 4"  (1.626 m)   Wt 63 kg   SpO2 99%   BMI 23.86 kg/m   Physical Exam Vitals reviewed.  Abdominal:     General: Abdomen is flat.     Palpations: Abdomen is soft.     Tenderness: There is abdominal tenderness in the left lower quadrant.     ED Results and Treatments Labs (all labs ordered are listed, but only abnormal results are displayed) Labs Reviewed  CBC WITH DIFFERENTIAL/PLATELET  COMPREHENSIVE METABOLIC PANEL  LIPASE, BLOOD  URINALYSIS, ROUTINE W REFLEX MICROSCOPIC                                                                                                                          Radiology No results found.  Pertinent labs & imaging results that were available during my care of the patient were reviewed by me and considered in my medical decision making (see MDM for details).  Medications Ordered in ED Medications  fentaNYL (SUBLIMAZE) injection 50 mcg (has no administration in time range)  ondansetron (ZOFRAN) injection 4 mg (has no administration in time range)                                                                                                                                     Procedures Procedures  (including critical care time)  Medical Decision Making / ED Course   This patient presents to the ED for concern of abdominal pain, this involves an extensive number of treatment options, and is a complaint that carries with it a high risk of complications and morbidity.  The differential diagnosis includes diverticulitis, obstruction, colitis, pyelonephritis, obstruction.  MDM: Patient's abdomen overall soft, he is tender in the left lower quadrant.  I provided analgesia.  Blood pressure improved from triage.  Analgesia provided.  CT imaging ordered.  Reassessment 5:30 PM-patient's labs have returned.  No leukocytosis, mild elevation in her LFTs  which appears to be chronic for her.  Her CT imaging was negative.  Reassessed patient, still having some mild abdominal pain.  Will try some Bentyl.  Discussed with the patient with her workup, and imaging, likely would not require admission.  Discussed return precautions with the patient the patient's husband at bedside.  They were agreeable with this plan.  Patient's blood pressure has been normal since being in the emergency department.   Additional history obtained: -Additional history obtained from husband at bedside -External records from outside source obtained and reviewed including: Chart review including previous notes, labs, imaging, consultation notes   Lab Tests: -I ordered, reviewed, and interpreted labs.   The pertinent results include:   Labs Reviewed  CBC WITH DIFFERENTIAL/PLATELET  COMPREHENSIVE METABOLIC PANEL  LIPASE, BLOOD  URINALYSIS, ROUTINE W REFLEX MICROSCOPIC      EKG left bundle branch block  EKG Interpretation Date/Time:    Ventricular Rate:    PR Interval:    QRS Duration:    QT Interval:    QTC Calculation:   R Axis:      Text Interpretation:           Imaging Studies ordered: I ordered imaging studies including CT imaging the abdomen pelvis I independently visualized and interpreted imaging. I agree with the radiologist interpretation   Medicines ordered and prescription drug management: Meds ordered this encounter  Medications   fentaNYL (SUBLIMAZE) injection 50 mcg   ondansetron (ZOFRAN) injection 4 mg    -I have reviewed the patients home medicines and have made adjustments as needed  Cardiac Monitoring: The patient was maintained on a cardiac monitor.  I personally viewed and interpreted the cardiac monitored which showed an underlying rhythm of: Normal sinus rhythm  Social Determinants of Health:  Factors impacting patients care include:    Reevaluation: After the interventions noted above, I reevaluated the patient and  found that they have :improved  Co morbidities that complicate the patient evaluation  Past Medical History:  Diagnosis Date   Anemia    Arachnoiditis    Bipolar disorder (HCC)    Cataract    removed   Chronic post-thoracotomy pain    CKD (chronic kidney disease)    GERD (gastroesophageal reflux disease)    Hypogammaglobulinemia (HCC)    Hypotension    Hypothyroid    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HCC)    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HCC)    Lung cancer (HCC) 2011, 2014   Memory loss    Migraine    Opioid abuse (HCC)    Osteoporosis    PMR (polymyalgia rheumatica) (HCC) 03/17/2022   Presence of neurostimulator    Restless legs    Sleep apnea       Dispostion: I considered admission for this patient, however with her labs and workup, do not believe admission is required at this time.     Final Clinical Impression(s) / ED Diagnoses Final diagnoses:  None     @PCDICTATION @    Anders Simmonds T, DO 06/26/23 1735

## 2023-06-28 ENCOUNTER — Encounter: Payer: Self-pay | Admitting: Family Medicine

## 2023-06-28 ENCOUNTER — Ambulatory Visit: Payer: Medicare PPO | Admitting: Family Medicine

## 2023-06-28 VITALS — BP 96/58 | HR 91 | Temp 97.8°F | Resp 16 | Ht 64.0 in | Wt 139.0 lb

## 2023-06-28 DIAGNOSIS — N1831 Chronic kidney disease, stage 3a: Secondary | ICD-10-CM | POA: Diagnosis not present

## 2023-06-28 DIAGNOSIS — M353 Polymyalgia rheumatica: Secondary | ICD-10-CM | POA: Diagnosis not present

## 2023-06-28 DIAGNOSIS — R0781 Pleurodynia: Secondary | ICD-10-CM | POA: Diagnosis not present

## 2023-06-28 DIAGNOSIS — E039 Hypothyroidism, unspecified: Secondary | ICD-10-CM | POA: Diagnosis not present

## 2023-06-28 DIAGNOSIS — R1032 Left lower quadrant pain: Secondary | ICD-10-CM | POA: Diagnosis not present

## 2023-06-28 DIAGNOSIS — I447 Left bundle-branch block, unspecified: Secondary | ICD-10-CM | POA: Diagnosis not present

## 2023-06-28 DIAGNOSIS — F319 Bipolar disorder, unspecified: Secondary | ICD-10-CM | POA: Diagnosis not present

## 2023-06-28 DIAGNOSIS — D631 Anemia in chronic kidney disease: Secondary | ICD-10-CM | POA: Diagnosis not present

## 2023-06-28 DIAGNOSIS — I951 Orthostatic hypotension: Secondary | ICD-10-CM | POA: Diagnosis not present

## 2023-06-28 DIAGNOSIS — D803 Selective deficiency of immunoglobulin G [IgG] subclasses: Secondary | ICD-10-CM | POA: Diagnosis not present

## 2023-06-28 DIAGNOSIS — I340 Nonrheumatic mitral (valve) insufficiency: Secondary | ICD-10-CM | POA: Diagnosis not present

## 2023-06-28 MED ORDER — OXYCODONE HCL 10 MG PO TABS: 10.0000 mg | ORAL_TABLET | Freq: Four times a day (QID) | ORAL | 0 refills | Status: DC | PRN

## 2023-06-28 MED ORDER — DICYCLOMINE HCL 20 MG PO TABS: 20.0000 mg | ORAL_TABLET | Freq: Two times a day (BID) | ORAL | 5 refills | Status: DC | PRN

## 2023-06-28 MED ORDER — NALOXONE HCL 4 MG/0.1ML NA LIQD: NASAL | 2 refills | Status: AC

## 2023-06-28 MED ORDER — FLUDROCORTISONE ACETATE 0.1 MG PO TABS: 0.1000 mg | ORAL_TABLET | Freq: Two times a day (BID) | ORAL | 1 refills | Status: DC

## 2023-06-28 NOTE — Progress Notes (Signed)
Chief Complaint  Patient presents with   Hospitalization Follow-up    Hospital Follow up    Subjective: Patient is a 77 y.o. female here for pain.  She is accompanied by a friend.  Patient went to the emergency department 2 days ago.  CT abdomen/pelvis was unremarkable.  She was started on Bentyl which did help.  When she was transferring from the bed to a wheelchair, she felt her ribs pop out.  She fractured them a couple months ago and she states this feels very similar. No bruising, swelling, or shortness of breath.  She is not having any bleeding in or dark tarry stools.  Patient's blood pressure has been low.  It was normal in the emergency department but when she checks it at home it is regularly running in the 80s.  She is compliant with midodrine 10 mg 3 times daily and Florinef 0.1 mg daily.  No adverse effects.  She does stay hydrated.  Eating normally.   Past Medical History:  Diagnosis Date   Anemia    Arachnoiditis    Bipolar disorder (HCC)    Cataract    removed   Chronic post-thoracotomy pain    CKD (chronic kidney disease)    GERD (gastroesophageal reflux disease)    Hypogammaglobulinemia (HCC)    Hypotension    Hypothyroid    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HCC)    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HCC)    Lung cancer (HCC) 2011, 2014   Memory loss    Migraine    Opioid abuse (HCC)    Osteoporosis    PMR (polymyalgia rheumatica) (HCC) 03/17/2022   Presence of neurostimulator    Restless legs    Sleep apnea     Objective: BP (!) 96/58 (BP Location: Left Arm, Cuff Size: Normal)   Pulse 91   Temp 97.8 F (36.6 C)   Resp 16   Ht 5\' 4"  (1.626 m)   Wt 139 lb (63 kg)   SpO2 98%   BMI 23.86 kg/m  General: Awake, appears stated age Heart: RRR MSK: TTP over the right lower CVA region with floating ribs; no erythema, edema, deformity, crepitus Skin: No rashes over the right lower thoracic region Lungs: CTAB, no rales, wheezes or rhonchi. No accessory muscle  use Psych: Age appropriate judgment and insight, normal affect and mood  Assessment and Plan: Orthostatic hypotension - Plan: fludrocortisone (FLORINEF) 0.1 MG tablet  Rib pain on right side - Plan: Oxycodone HCl 10 MG TABS  Left lower quadrant abdominal pain  Chronic, uncontrolled.  Continue midodrine 10 mg 3 times daily.  Increase Florinef from 0.1 mg daily to twice daily.  She has an appointment with her cardiologist next week.  Stay hydrated. She fractured her ribs a couple months ago.  She thinks she did it again.  She has been in a lot of pain.  She took oxycodone 5 mg as needed last time which did not help.  She took a friend's 10 mg tab which did help.  She is requesting a prescription.  She does have Narcan at home.  Will prescribe.  She will monitor blood pressure at home.  Warnings about this verbalized and written down. Likely related to stress.  Continue Bentyl as needed.  If still having frequent episodes in the next several weeks, will discuss daily medication.  She does have a gastroenterologist she can reach out to as well. The patient voiced understanding and agreement to the plan.  Jilda Roche [place], DO 06/28/23  5:03 PM

## 2023-06-28 NOTE — Patient Instructions (Addendum)
Monitor blood pressure while on the pain medication. Take the Narcan if it drops low.   Do not drink alcohol, do any illicit/street drugs, drive or do anything that requires alertness while on this medicine.   Ice/cold pack over area for 10-15 min twice daily.  Heat (pad or rice pillow in microwave) over affected area, 10-15 minutes twice daily.   OK to take Tylenol 1000 mg (2 extra strength tabs) or 975 mg (3 regular strength tabs) every 6 hours as needed.  Let me know if things are not improving with the stomach.   Let us know if you need anything.

## 2023-06-29 ENCOUNTER — Telehealth: Payer: Self-pay

## 2023-06-29 DIAGNOSIS — M810 Age-related osteoporosis without current pathological fracture: Secondary | ICD-10-CM

## 2023-06-29 DIAGNOSIS — M62838 Other muscle spasm: Secondary | ICD-10-CM | POA: Diagnosis not present

## 2023-06-29 DIAGNOSIS — M542 Cervicalgia: Secondary | ICD-10-CM | POA: Diagnosis not present

## 2023-06-29 NOTE — Telephone Encounter (Signed)
 Prolia VOB initiated via [email or web address]  Next Prolia inj DUE: 08/10/23

## 2023-06-30 ENCOUNTER — Encounter: Payer: Self-pay | Admitting: Psychiatry

## 2023-06-30 ENCOUNTER — Other Ambulatory Visit (HOSPITAL_COMMUNITY): Payer: Self-pay

## 2023-06-30 NOTE — Telephone Encounter (Signed)
Pt ready for scheduling for PROLIA on or after : 08/10/23  Out-of-pocket cost due at time of visit: $40  Primary: [employer] Prolia co-insurance: $40 Admin fee co-insurance: 0%  Secondary: --- Prolia co-insurance:  Admin fee co-insurance:   Medical Benefit Details: Date Benefits were checked: 06/29/23 Deductible: NO/ Coinsurance: $40/ Admin Fee: 0%  Prior Auth: APPROVED PA# [id number] Expiration Date: 10/24/21-08/16/24  # of doses approved: 2  Pharmacy benefit: Copay $--- (REFILL TOO SOON 07/02/23) If patient wants fill through the pharmacy benefit please send prescription to: [employer], and include estimated need by date in rx notes. Pharmacy will ship medication directly to the office.  Patient NOT eligible for Prolia Copay Card. Copay Card can make patient's cost as little as $25. Link to apply: [email or web address]://[email or web address]  ** This summary of benefits is an estimation of the patient's out-of-pocket cost. Exact cost may very based on individual plan coverage.

## 2023-06-30 NOTE — Telephone Encounter (Signed)
Pharmacy Patient Advocate Encounter   Received notification from  [facility] Portal that prior authorization for PROLIA is required/requested.   Insurance verification completed.   The patient is insured through [place] .   Per test claim: PA required; PA submitted to above mentioned insurance via CoverMyMeds Key/confirmation #/EOC [id number] Status is pending

## 2023-07-02 DIAGNOSIS — D631 Anemia in chronic kidney disease: Secondary | ICD-10-CM | POA: Diagnosis not present

## 2023-07-02 DIAGNOSIS — N1831 Chronic kidney disease, stage 3a: Secondary | ICD-10-CM | POA: Diagnosis not present

## 2023-07-02 DIAGNOSIS — F319 Bipolar disorder, unspecified: Secondary | ICD-10-CM | POA: Diagnosis not present

## 2023-07-02 DIAGNOSIS — I340 Nonrheumatic mitral (valve) insufficiency: Secondary | ICD-10-CM | POA: Diagnosis not present

## 2023-07-02 DIAGNOSIS — E039 Hypothyroidism, unspecified: Secondary | ICD-10-CM | POA: Diagnosis not present

## 2023-07-02 DIAGNOSIS — I447 Left bundle-branch block, unspecified: Secondary | ICD-10-CM | POA: Diagnosis not present

## 2023-07-02 DIAGNOSIS — M353 Polymyalgia rheumatica: Secondary | ICD-10-CM | POA: Diagnosis not present

## 2023-07-02 DIAGNOSIS — I951 Orthostatic hypotension: Secondary | ICD-10-CM | POA: Diagnosis not present

## 2023-07-02 DIAGNOSIS — D803 Selective deficiency of immunoglobulin G [IgG] subclasses: Secondary | ICD-10-CM | POA: Diagnosis not present

## 2023-07-05 ENCOUNTER — Other Ambulatory Visit: Payer: Self-pay | Admitting: Physician Assistant

## 2023-07-05 NOTE — Progress Notes (Unsigned)
Cardiology Office Note:   Date:  07/08/2023  ID:  Megan, Brewer 08-17-1946, MRN [id number] PCP: Sharlene Dory, DO  [facility] Providers Cardiologist:  Rollene Rotunda, MD {  History of Present Illness:   Megan Brewer is a 77 y.o. female who presents for evaluation of chest pain.  She was seen by Dr. Tomie China previously.    She was in the emergency room recently with low blood pressures.  She has had orthostatic hypotension treated with midodrine.  She has multiple other medical problems.  I reviewed the emergency room records the other day and her blood pressure readings at this time at home have been in the 60s and she was transferred via EMS.  Her blood pressure did come up and she was treated with IV hydration.   She was in the hospital in 2023 with sepsis.  At that time because she had an elevated troponin.  She had a CT that ruled out a thoracic aneurysm or pulmonary embolism.  Echo was unremarkable.   She has had many ED visits with hypotension.  She has not been admitted. She typically gets IV hydration.   Her last ED visit was a couple of days ago.  She had abdominal pain.    She has been in the ED 11 times this year.     She presents today for follow-up and she is not actually having any cardiovascular complaints.  Her blood pressures are..  She is not reporting any new shortness of breath, PND or orthopnea.  She did fall landed on her pelvis she said this weekend.  She has not received a treatment for this and did not want to go to the emergency room.  She did not reported bleeding or bruising.   ROS: As stated in the HPI and negative for all other systems.  Studies Reviewed:    EKG:   NA  Risk Assessment/Calculations:              Physical Exam:   VS:  BP (!) 104/58 (BP Location: Left Arm, Patient Position: Sitting, Cuff Size: Normal)   Pulse 81   Ht 5' 4.5" (1.638 m)   Wt 140 lb 4.8 oz (63.6 kg)   SpO2 98%   BMI 23.71 kg/m    Wt  Readings from Last 3 Encounters:  07/08/23 140 lb 4.8 oz (63.6 kg)  06/28/23 139 lb (63 kg)  06/26/23 139 lb (63 kg)     GEN: Well nourished, well developed in no acute distress NECK: No JVD; No carotid bruits CARDIAC: RRR, 3 of 6 brief apical systolic murmur radiating slightly at the right upper tract, no diastolic murmurs, rubs, gallops RESPIRATORY:  Clear to auscultation without rales, wheezing or rhonchi  ABDOMEN: Soft, non-tender, non-distended EXTREMITIES:  No edema; No deformity , decreased DP/PT  ASSESSMENT AND PLAN:   Mild to Mod MR and TR:     She had no significant valvular abnormalities on echo last month.    The patient can follow this clinically and we will see her in 1 year.  Hypotension: Seems to be well-controlled on the current regimen.  No change in therapy.  Pelvic pain: I have suggested follow-up in the emergency room where she can get imaging.  He can think about going.  Did not go with this time.  Encouraged her to try to follow-up with primary care physician for further management.     Follow up with me in one year.  Signed, Rollene Rotunda, MD

## 2023-07-07 ENCOUNTER — Other Ambulatory Visit: Payer: Self-pay | Admitting: Physician Assistant

## 2023-07-07 ENCOUNTER — Telehealth: Payer: Self-pay | Admitting: *Deleted

## 2023-07-07 DIAGNOSIS — D803 Selective deficiency of immunoglobulin G [IgG] subclasses: Secondary | ICD-10-CM | POA: Diagnosis not present

## 2023-07-07 DIAGNOSIS — I447 Left bundle-branch block, unspecified: Secondary | ICD-10-CM | POA: Diagnosis not present

## 2023-07-07 DIAGNOSIS — D631 Anemia in chronic kidney disease: Secondary | ICD-10-CM | POA: Diagnosis not present

## 2023-07-07 DIAGNOSIS — M353 Polymyalgia rheumatica: Secondary | ICD-10-CM | POA: Diagnosis not present

## 2023-07-07 DIAGNOSIS — F319 Bipolar disorder, unspecified: Secondary | ICD-10-CM | POA: Diagnosis not present

## 2023-07-07 DIAGNOSIS — I951 Orthostatic hypotension: Secondary | ICD-10-CM | POA: Diagnosis not present

## 2023-07-07 DIAGNOSIS — N1831 Chronic kidney disease, stage 3a: Secondary | ICD-10-CM | POA: Diagnosis not present

## 2023-07-07 DIAGNOSIS — E039 Hypothyroidism, unspecified: Secondary | ICD-10-CM | POA: Diagnosis not present

## 2023-07-07 DIAGNOSIS — I340 Nonrheumatic mitral (valve) insufficiency: Secondary | ICD-10-CM | POA: Diagnosis not present

## 2023-07-07 NOTE — Progress Notes (Signed)
  Care Coordination Note  07/07/2023 Name: Megan Brewer MRN: [id number] DOB: Jul 06, 1946  Megan Brewer is a 77 y.o. year old female who is a primary care patient of Sharlene Dory, DO and is actively engaged with the care management team. I reached out to [employer] by phone today to assist with re-scheduling a follow up visit with the RN Case Manager  Follow up plan: Unsuccessful telephone outreach attempt made. A HIPAA compliant phone message was left for the patient providing contact information and requesting a return call.   Burman Nieves, CCMA Care Coordination Care Guide Direct Dial: [phone]

## 2023-07-08 ENCOUNTER — Emergency Department (HOSPITAL_COMMUNITY)
Admission: EM | Admit: 2023-07-08 | Discharge: 2023-07-08 | Disposition: A | Discharge status: Home / Self Care | Payer: Medicare PPO | Attending: Emergency Medicine | Admitting: Emergency Medicine

## 2023-07-08 ENCOUNTER — Ambulatory Visit: Payer: Medicare PPO | Attending: Cardiology | Admitting: Cardiology

## 2023-07-08 ENCOUNTER — Encounter: Payer: Self-pay | Admitting: Cardiology

## 2023-07-08 ENCOUNTER — Encounter (HOSPITAL_COMMUNITY): Payer: Self-pay

## 2023-07-08 ENCOUNTER — Emergency Department (HOSPITAL_COMMUNITY): Payer: Medicare PPO

## 2023-07-08 ENCOUNTER — Other Ambulatory Visit: Payer: Self-pay

## 2023-07-08 VITALS — BP 104/58 | HR 81 | Ht 64.5 in | Wt 140.3 lb

## 2023-07-08 DIAGNOSIS — S129XXA Fracture of neck, unspecified, initial encounter: Secondary | ICD-10-CM | POA: Diagnosis not present

## 2023-07-08 DIAGNOSIS — Z9682 Presence of neurostimulator: Secondary | ICD-10-CM | POA: Diagnosis not present

## 2023-07-08 DIAGNOSIS — I340 Nonrheumatic mitral (valve) insufficiency: Secondary | ICD-10-CM

## 2023-07-08 DIAGNOSIS — I951 Orthostatic hypotension: Secondary | ICD-10-CM | POA: Diagnosis not present

## 2023-07-08 DIAGNOSIS — I700 Atherosclerosis of aorta: Secondary | ICD-10-CM | POA: Insufficient documentation

## 2023-07-08 DIAGNOSIS — M160 Bilateral primary osteoarthritis of hip: Secondary | ICD-10-CM | POA: Diagnosis not present

## 2023-07-08 DIAGNOSIS — R102 Pelvic and perineal pain: Secondary | ICD-10-CM | POA: Insufficient documentation

## 2023-07-08 DIAGNOSIS — S32019A Unspecified fracture of first lumbar vertebra, initial encounter for closed fracture: Secondary | ICD-10-CM | POA: Diagnosis not present

## 2023-07-08 DIAGNOSIS — I10 Essential (primary) hypertension: Secondary | ICD-10-CM | POA: Diagnosis not present

## 2023-07-08 DIAGNOSIS — W010XXA Fall on same level from slipping, tripping and stumbling without subsequent striking against object, initial encounter: Secondary | ICD-10-CM | POA: Diagnosis not present

## 2023-07-08 DIAGNOSIS — M545 Low back pain, unspecified: Secondary | ICD-10-CM | POA: Insufficient documentation

## 2023-07-08 DIAGNOSIS — M549 Dorsalgia, unspecified: Secondary | ICD-10-CM | POA: Diagnosis not present

## 2023-07-08 DIAGNOSIS — R0902 Hypoxemia: Secondary | ICD-10-CM | POA: Diagnosis not present

## 2023-07-08 DIAGNOSIS — N200 Calculus of kidney: Secondary | ICD-10-CM | POA: Diagnosis not present

## 2023-07-08 DIAGNOSIS — I213 ST elevation (STEMI) myocardial infarction of unspecified site: Secondary | ICD-10-CM | POA: Diagnosis not present

## 2023-07-08 DIAGNOSIS — R109 Unspecified abdominal pain: Secondary | ICD-10-CM | POA: Insufficient documentation

## 2023-07-08 DIAGNOSIS — R000 Tachycardia, unspecified: Secondary | ICD-10-CM | POA: Diagnosis not present

## 2023-07-08 DIAGNOSIS — K8689 Other specified diseases of pancreas: Secondary | ICD-10-CM | POA: Diagnosis not present

## 2023-07-08 LAB — CBC WITH DIFFERENTIAL/PLATELET
Abs Immature Granulocytes: 0.02 10*3/uL (ref 0.00–0.07)
Basophils Absolute: 0.1 10*3/uL (ref 0.0–0.1)
Basophils Relative: 1 %
Eosinophils Absolute: 0.1 10*3/uL (ref 0.0–0.5)
Eosinophils Relative: 1 %
HCT: 36.3 % (ref 36.0–46.0)
Hemoglobin: 12.2 g/dL (ref 12.0–15.0)
Immature Granulocytes: 0 %
Lymphocytes Relative: 27 %
Lymphs Abs: 1.4 10*3/uL (ref 0.7–4.0)
MCH: 32 pg (ref 26.0–34.0)
MCHC: 33.6 g/dL (ref 30.0–36.0)
MCV: 95.3 fL (ref 80.0–100.0)
Monocytes Absolute: 0.5 10*3/uL (ref 0.1–1.0)
Monocytes Relative: 10 %
Neutro Abs: 3.1 10*3/uL (ref 1.7–7.7)
Neutrophils Relative %: 61 %
Platelets: 129 10*3/uL — ABNORMAL LOW (ref 150–400)
RBC: 3.81 MIL/uL — ABNORMAL LOW (ref 3.87–5.11)
RDW: 12.8 % (ref 11.5–15.5)
WBC: 5.1 10*3/uL (ref 4.0–10.5)
nRBC: 0 % (ref 0.0–0.2)

## 2023-07-08 LAB — COMPREHENSIVE METABOLIC PANEL
ALT: 44 U/L (ref 0–44)
AST: 44 U/L — ABNORMAL HIGH (ref 15–41)
Albumin: 2.8 g/dL — ABNORMAL LOW (ref 3.5–5.0)
Alkaline Phosphatase: 56 U/L (ref 38–126)
Anion gap: 6 (ref 5–15)
BUN: 28 mg/dL — ABNORMAL HIGH (ref 8–23)
CO2: 23 mmol/L (ref 22–32)
Calcium: 8.3 mg/dL — ABNORMAL LOW (ref 8.9–10.3)
Chloride: 109 mmol/L (ref 98–111)
Creatinine, Ser: 1.15 mg/dL — ABNORMAL HIGH (ref 0.44–1.00)
GFR, Estimated: 50 mL/min — ABNORMAL LOW (ref >60)
Glucose, Bld: 102 mg/dL — ABNORMAL HIGH (ref 70–99)
Potassium: 3.4 mmol/L — ABNORMAL LOW (ref 3.5–5.1)
Sodium: 138 mmol/L (ref 135–145)
Total Bilirubin: 0.4 mg/dL (ref <1.2)
Total Protein: 6.6 g/dL (ref 6.5–8.1)

## 2023-07-08 MED ORDER — OXYCODONE-ACETAMINOPHEN 5-325 MG PO TABS
1.0000 | ORAL_TABLET | Freq: Once | ORAL | Status: AC
  Administered 2023-07-08: 1 via ORAL
  Filled 2023-07-08: qty 1

## 2023-07-08 MED ORDER — OXYCODONE-ACETAMINOPHEN 5-325 MG PO TABS: 1.0000 | ORAL_TABLET | Freq: Four times a day (QID) | ORAL | 0 refills | Status: DC | PRN

## 2023-07-08 MED ORDER — ONDANSETRON HCL 4 MG/2ML IJ SOLN
4.0000 mg | Freq: Once | INTRAMUSCULAR | Status: AC
  Administered 2023-07-08: 4 mg via INTRAVENOUS
  Filled 2023-07-08: qty 2

## 2023-07-08 MED ORDER — LIDOCAINE 4 % EX PTCH: 1.0000 | MEDICATED_PATCH | CUTANEOUS | 0 refills | Status: DC

## 2023-07-08 MED ORDER — DOCUSATE SODIUM 100 MG PO CAPS: 100.0000 mg | ORAL_CAPSULE | Freq: Two times a day (BID) | ORAL | 0 refills | Status: DC

## 2023-07-08 MED ORDER — ACETAMINOPHEN 500 MG PO TABS
1000.0000 mg | ORAL_TABLET | Freq: Once | ORAL | Status: AC
  Administered 2023-07-08: 1000 mg via ORAL
  Filled 2023-07-08: qty 2

## 2023-07-08 MED ORDER — MORPHINE SULFATE (PF) 4 MG/ML IV SOLN
4.0000 mg | Freq: Once | INTRAVENOUS | Status: AC
  Administered 2023-07-08: 4 mg via INTRAVENOUS
  Filled 2023-07-08: qty 1

## 2023-07-08 MED ORDER — SODIUM CHLORIDE 0.9 % IV BOLUS
1000.0000 mL | Freq: Once | INTRAVENOUS | Status: AC
  Administered 2023-07-08: 1000 mL via INTRAVENOUS

## 2023-07-08 MED ORDER — HEPARIN SOD (PORK) LOCK FLUSH 100 UNIT/ML IV SOLN
500.0000 [IU] | Freq: Once | INTRAVENOUS | Status: DC
  Filled 2023-07-08: qty 5

## 2023-07-08 MED ORDER — DICLOFENAC SODIUM 1 % EX GEL: 4.0000 g | Freq: Four times a day (QID) | CUTANEOUS | 0 refills | Status: DC

## 2023-07-08 NOTE — ED Triage Notes (Signed)
"  Went to sit on toilet 5 days ago and fell and hit lower back. Called EMS today for back pain she has been having since fall. Patient saw cardiologist this morning and told EMS they may have suggested she come to ED" per EMS

## 2023-07-08 NOTE — ED Provider Notes (Signed)
[place] EMERGENCY DEPARTMENT AT [facility] Provider Note   CSN: [id number] Arrival date & time: 07/08/23  1230     History  Chief Complaint  Patient presents with   Back Pain    Megan Brewer is a 77 y.o. female.  77 yo F with a chief complaints of right-sided low back pain.  The patient states that about 5 days ago she was trying to flush her toilets and she felt her knee went out the wrong direction and she lost her balance and fell with her back against the toilet seat.  Since then she has been able to get up and walk but with some discomfort.  Things gotten worse over time, she went to see her cardiologist today and was in significant pain.  He had encouraged her to come to the emergency department at that point she was not ready when she got home she decided come here for evaluation.  She says it hurts worse at the flat part of her pelvis on the right.  She does not feel like it radiates down the leg.   Back Pain      Home Medications Prior to Admission medications   Medication Sig Start Date End Date Taking? Authorizing Provider  acetaminophen (TYLENOL) 500 MG tablet Take 1,000 mg by mouth as needed for moderate pain.    [provider]  azelastine (ASTELIN) 0.1 % nasal spray Place 2 sprays into both nostrils 2 (two) times daily. 03/10/21   Hetty Blend, FNP  baclofen (LIORESAL) 10 MG tablet Take 10 mg by mouth daily as needed for muscle spasms. 06/03/20   [provider]  CALCIUM PO Take 6 tablets by mouth daily.    [provider]  cholecalciferol (VITAMIN D3) 25 MCG (1000 UNIT) tablet Take 1,000 Units by mouth daily.    [provider]  cycloSPORINE (RESTASIS) 0.05 % ophthalmic emulsion Place 1 drop into both eyes in the morning, at noon, in the evening, and at bedtime.    [provider]  denosumab (PROLIA) 60 MG/ML SOSY injection Pt to get at office.  Appt is 02/10/23 01/19/23   Sharlene Dory, DO   dexlansoprazole (DEXILANT) 60 MG capsule Take 1 capsule (60 mg total) by mouth daily. Patient needs follow up appointment for future refills. Please call [phone] to schedule an appointment. 05/07/23   Esterwood, Amy S, PA-C  dicyclomine (BENTYL) 20 MG tablet Take 1 tablet (20 mg total) by mouth 2 (two) times daily as needed for spasms. 06/28/23   Sharlene Dory, DO  famotidine (PEPCID) 20 MG tablet Take 1 tablet (20 mg total) by mouth at bedtime. Patient needs follow up appointment for future refills. Please call [phone] to schedule an appointment. 06/21/23   Esterwood, Amy S, PA-C  ferrous sulfate 325 (65 FE) MG tablet Take 650 mg by mouth daily with breakfast.    [provider]  fludrocortisone (FLORINEF) 0.1 MG tablet Take 1 tablet (0.1 mg total) by mouth 2 (two) times daily. 06/28/23   Wendling, Jilda Roche, DO  GAMMAGARD 20 GM/200ML SOLN Inject 40 g into the vein every 21 ( twenty-one) days. INFUSE 40G INTRAVENOUSLY EVERY 3 WEEKS 02/27/22   [provider]  heparin lock flush 100 UNIT/ML SOLN injection Inject 500 Units into the vein every 21 ( twenty-one) days. 03/31/21   [provider]  ibuprofen (ADVIL) 200 MG tablet Take 600 mg by mouth as needed for mild pain.    [provider]  KEVZARA 200 MG/1. SOAJ Inject 1.14 mLs into the skin every 14 (fourteen) days. 09/09/22   [provider]  levothyroxine (SYNTHROID) 75 MCG tablet TAKE ONE (1) TABLET BY MOUTH EACH DAY BEFORE BREAKFAST ON EMPTY STOMACH 04/07/23   Sharlene Dory, DO  magnesium oxide (MAG-OX) 400 MG tablet Take 400 mg by mouth daily.     [provider]  midodrine (PROAMATINE) 10 MG tablet Take 1 tablet (10 mg total) by mouth 3 (three) times daily. 02/24/23   Wendling, Jilda Roche, DO  MYRBETRIQ 50 MG TB24 tablet Take 50 mg by mouth daily. 10/07/22   [provider]  naloxone [facility]) nasal spray 4 mg/0.1 mL Spray up nostril in event  of opioid overdose. 06/28/23   Sharlene Dory, DO  ondansetron (ZOFRAN) 8 MG tablet TAKE ONE TABLET BY MOUTH EVERY EIGHT HOURS AS NEEDED FOR NAUSEA OR VOMITING 02/15/23   Carmelia Roller, Jilda Roche, DO  Oxycodone HCl 10 MG TABS Take 1 tablet (10 mg total) by mouth every 6 (six) hours as needed ([facility]). 06/28/23   Sharlene Dory, DO  potassium chloride (KLOR-CON) 10 MEQ tablet TAKE ONE (1) TABLET BY MOUTH FOUR (4) TIMES DAILY 04/26/23   Wendling, Jilda Roche, DO  pregabalin (LYRICA) 50 MG capsule Take 1 capsule (50 mg total) by mouth 3 (three) times daily. 04/07/23   Sharlene Dory, DO  prochlorperazine (COMPAZINE) 10 MG tablet Take 1 tablet (10 mg total) by mouth 2 (two) times daily as needed for nausea or vomiting. 04/05/23   Arthor Captain, PA-C  pyridoxine (B-6) 100 MG tablet Take 100 mg by mouth daily.    Sharlene Dory, DO  rOPINIRole (REQUIP) 0.5 MG tablet Take 0.5 mg by mouth 3 (three) times daily.    Sharlene Dory, DO  sucralfate (CARAFATE) 1 GM/10ML suspension TAKE 10 MLS (1 G TOTAL) BY MOUTH 4 TIMESDAILY AS NEEDED 01/25/23   Sharlene Dory, DO  torsemide 40 MG TABS Take 20 mg by mouth 2 (two) times daily. 03/31/23   Carlos Levering, NP  triamcinolone cream (KENALOG) 0.1 % Apply 1 application  topically 2 (two) times daily as needed (rash).    [provider]  vitamin B-12 (CYANOCOBALAMIN) 1000 MCG tablet Take 1,000 mcg by mouth daily. 02/14/21   [provider]  Vitamin D, Ergocalciferol, (DRISDOL) 1.25 MG (50000 UNIT) CAPS capsule TAKE 1 CAPSULE ONCE WEEKLY 01/04/23   [name], Drema Pry, MD  ZINC OXIDE, TOPICAL, 10 % CREA Apply 1 application topically 2 (two) times daily as needed (rash). 02/14/21   Glade Lloyd, MD      Allergies    Butorphanol, Erythromycin base, Pregabalin, Pregabalin er, Propoxyphene, Sumatriptan, Tetracycline, Ciprofloxacin, Corticosteroids, Doxycycline, Cefixime, Gabapentin,  Isometheptene-dichloral-apap, Ketorolac, Prednisone, Promethazine, and Tape    Review of Systems   Review of Systems  Musculoskeletal:  Positive for back pain.    Physical Exam Updated Vital Signs Ht 5' 4.5" (1.638 m)   Wt 63.5 kg   BMI 23.66 kg/m  Physical Exam Vitals and nursing note reviewed.  Constitutional:      General: She is not in acute distress.    Appearance: She is well-developed. She is not diaphoretic.  HENT:     Head: Normocephalic and atraumatic.  Eyes:     Pupils: Pupils are equal, round, and reactive to light.  Cardiovascular:     Rate and Rhythm: Normal rate and regular rhythm.     Heart sounds: No murmur heard.  No friction rub. No gallop.  Pulmonary:     Effort: Pulmonary effort is normal.     Breath sounds: No wheezing or rales.  Abdominal:     General: There is no distension.     Palpations: Abdomen is soft.     Tenderness: There is no abdominal tenderness.  Musculoskeletal:        General: Tenderness present.     Cervical back: Normal range of motion and neck supple.     Comments: Significant pain to the right SI joint area.  She does have pain with internal and external rotation of the right lower extremity.  Pulse motor and sensation intact.  No obvious midline spinal tenderness step-offs or deformities.  Skin:    General: Skin is warm and dry.  Neurological:     Mental Status: She is alert and oriented to person, place, and time.  Psychiatric:        Behavior: Behavior normal.     ED Results / Procedures / Treatments   Labs (all labs ordered are listed, but only abnormal results are displayed) Labs Reviewed  CBC WITH DIFFERENTIAL/PLATELET - Abnormal; Notable for the following components:      Result Value   RBC 3.81 (*)    Platelets 129 (*)    All other components within normal limits  COMPREHENSIVE METABOLIC PANEL    EKG None  Radiology DG Pelvis Portable  Result Date: 07/08/2023 CLINICAL DATA:  Low back pain, status post  fall. EXAM: PORTABLE PELVIS 1-2 VIEWS COMPARISON:  04/04/2020. FINDINGS: Visualized pelvis is intact with normal and symmetric sacroiliac joints. No acute fracture or dislocation. No aggressive osseous lesion. Visualized sacral arcuate lines are unremarkable. Unremarkable symphysis pubis. There are mild degenerative changes of bilateral hip joints without significant joint space narrowing. Osteophytosis of the superior acetabulum. No radiopaque foreign bodies. There are 2 battery packs overlying the bilateral iliac wings with there leads coursing superiorly, extending out of the field of view. There is an additional lead from right-sided battery pack which courses down words with its tip along the left S3-4 neural foramina. IMPRESSION: *No acute osseous abnormality of the pelvis. Electronically Signed   By: Jules Schick M.D.   On: 07/08/2023 14:24    Procedures Procedures    Medications Ordered in ED Medications  heparin lock flush 100 unit/mL (has no administration in time range)  sodium chloride 0.9 % bolus 1,000 mL (1,000 mLs Intravenous New Bag/Given 07/08/23 1352)  morphine (PF) 4 MG/ML injection 4 mg (4 mg Intravenous Given 07/08/23 1347)  ondansetron (ZOFRAN) injection 4 mg (4 mg Intravenous Given 07/08/23 1356)  acetaminophen (TYLENOL) tablet 1,000 mg (1,000 mg Oral Given 07/08/23 1327)    ED Course/ Medical Decision Making/ A&P                                 Medical Decision Making Amount and/or Complexity of Data Reviewed Labs: ordered. Radiology: ordered.  Risk OTC drugs. Prescription drug management.   77 yo F with a chief complaints of right-sided low back pain after she had fallen.  This occurred about 5 days ago.  Having worsening pain difficulty walking.  Film independently interpreted by me without obvious fracture.  Will obtain CT imaging.  Signed out to Dr. Rhae Hammock, please see their note for further details of care in the ED.  The patients results and plan were  reviewed and discussed.   Any x-rays performed  were independently reviewed by myself.   Differential diagnosis were considered with the presenting HPI.  Medications  heparin lock flush 100 unit/mL (has no administration in time range)  sodium chloride 0.9 % bolus 1,000 mL (1,000 mLs Intravenous New Bag/Given 07/08/23 1352)  morphine (PF) 4 MG/ML injection 4 mg (4 mg Intravenous Given 07/08/23 1347)  ondansetron (ZOFRAN) injection 4 mg (4 mg Intravenous Given 07/08/23 1356)  acetaminophen (TYLENOL) tablet 1,000 mg (1,000 mg Oral Given 07/08/23 1327)    Vitals:   07/08/23 1400  Weight: 63.5 kg  Height: 5' 4.5" (1.638 m)    Final diagnoses:  Acute left-sided low back pain without sciatica    Admission/ observation were discussed with the admitting physician, patient and/or family and they are comfortable with the plan.          Final Clinical Impression(s) / ED Diagnoses Final diagnoses:  Acute left-sided low back pain without sciatica    Rx / DC Orders ED Discharge Orders     None         Melene Plan, DO 07/08/23 1513

## 2023-07-08 NOTE — ED Notes (Signed)
Dr. Rhae Hammock at bedside to speak with pt and husband

## 2023-07-08 NOTE — Discharge Instructions (Signed)
Please use the brace and take the Percocet as needed for pain.  Try the Voltaren gel as well as the lidocaine patches as well.  Please follow-up with your doctor.  Return to the ER for worsening symptoms.

## 2023-07-08 NOTE — Progress Notes (Signed)
Orthopedic Tech Progress Note Patient Details:  Megan Brewer [birth year] [id number]  Applied LSO BRACE with husband at bedside   Ortho Devices Type of Ortho Device: Lumbar corsett Ortho Device/Splint Location: BACK Ortho Device/Splint Interventions: Ordered, Application, Adjustment   Post Interventions Patient Tolerated: Well Instructions Provided: Care of device  [name] 07/08/2023, 8:51 PM

## 2023-07-08 NOTE — ED Provider Notes (Signed)
77 year old female presenting to the emergency department today with back pain after a fall.  The patient was signed out to me with CT scans pending.    Physical Exam  BP (!) 116/52 (BP Location: Left Arm)   Pulse 71   Temp 97.8 F (36.6 C) (Oral)   Resp 16   Ht 5' 4.5" (1.638 m)   Wt 63.5 kg   SpO2 100%   BMI 23.66 kg/m   Physical Exam General: No acute distress  Procedures  Procedures  ED Course / MDM    Medical Decision Making Amount and/or Complexity of Data Reviewed Labs: ordered. Radiology: ordered.  Risk OTC drugs. Prescription drug management.   The patient CT scan does show an L1 transverse process fracture.  I did call and discussed this with neurosurgery recommends LSO brace.  This was fitted here in the emergency department.  The patient was given pain medication here and eventually her pain is controlled.  She is still having some pain but normally gets around with a wheelchair at baseline.  We discussed observation admission for physical therapy evaluation and possible SNF placement versus going home.  The patient does have home physical therapy.  She would like to go home.  She will be treated for pain will be discharged with return precautions.       Durwin Glaze, MD 07/08/23 2213

## 2023-07-08 NOTE — ED Notes (Signed)
Called [phone] in regards to [employer] Brace for a L1 transverse fracture, rep will send someone with ortho to apply brace

## 2023-07-08 NOTE — Patient Instructions (Signed)
   Follow-Up: At [facility], you and your health needs are our priority.  As part of our continuing mission to provide you with exceptional heart care, we have created designated Provider Care Teams.  These Care Teams include your primary Cardiologist (physician) and Advanced Practice Providers (APPs -  Physician Assistants and Nurse Practitioners) who all work together to provide you with the care you need, when you need it.  We recommend signing up for the patient portal called "MyChart".  Sign up information is provided on this After Visit Summary.  MyChart is used to connect with patients for Virtual Visits (Telemedicine).  Patients are able to view lab/test results, encounter notes, upcoming appointments, etc.  Non-urgent messages can be sent to your provider as well.   To learn more about what you can do with MyChart, go to [email or web address].    Your next appointment:   12 month(s)  Provider:   James Hochrein, MD      

## 2023-07-08 NOTE — ED Notes (Signed)
Help get patient into a gown on the monitor did EKG shown to Dr Adela Lank patient is resting with call bell in reach

## 2023-07-09 ENCOUNTER — Encounter: Payer: Self-pay | Admitting: Family Medicine

## 2023-07-09 DIAGNOSIS — M62838 Other muscle spasm: Secondary | ICD-10-CM | POA: Diagnosis not present

## 2023-07-09 DIAGNOSIS — M542 Cervicalgia: Secondary | ICD-10-CM | POA: Diagnosis not present

## 2023-07-09 NOTE — Telephone Encounter (Signed)
Pt's spouse Onalee Hua called to schedule appt with PCP to follow up on L1 Fracture however provider has no availability until early December. Please call pt to advise if they can be worked in late morning or afternoon.

## 2023-07-12 ENCOUNTER — Ambulatory Visit: Payer: Medicare PPO | Admitting: Family Medicine

## 2023-07-12 ENCOUNTER — Encounter: Payer: Self-pay | Admitting: Family Medicine

## 2023-07-12 VITALS — BP 110/64 | HR 71 | Temp 98.0°F | Resp 16 | Ht 64.0 in | Wt 140.0 lb

## 2023-07-12 DIAGNOSIS — R0781 Pleurodynia: Secondary | ICD-10-CM | POA: Diagnosis not present

## 2023-07-12 DIAGNOSIS — S32018A Other fracture of first lumbar vertebra, initial encounter for closed fracture: Secondary | ICD-10-CM | POA: Diagnosis not present

## 2023-07-12 MED ORDER — OXYCODONE HCL 10 MG PO TABS: 10.0000 mg | ORAL_TABLET | Freq: Four times a day (QID) | ORAL | 0 refills | Status: DC | PRN

## 2023-07-12 NOTE — Progress Notes (Addendum)
Musculoskeletal Exam  Patient: Megan Brewer DOB: [birth year]  DOS: 07/12/2023  SUBJECTIVE:  Chief Complaint:   Chief Complaint  Patient presents with   Back Pain    Back pain    Megan Brewer is a 77 y.o.  female for evaluation and treatment of back pain.   Onset:  10 days ago.  Fell in bathroom.  Location: low Character:  aching and sharp  Progression of issue:  is unchanged Associated symptoms: no bruising, swelling, radiation down legs.  Denies bowel/bladder incontinence or weakness Treatment: to date has been rest, Percocet, and ice.   Neurovascular symptoms: no  Past Medical History:  Diagnosis Date   Anemia    Arachnoiditis    Bipolar disorder (HCC)    Cataract    removed   Chronic post-thoracotomy pain    CKD (chronic kidney disease)    GERD (gastroesophageal reflux disease)    Hypogammaglobulinemia (HCC)    Hypotension    Hypothyroid    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HCC)    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HCC)    Lung cancer (HCC) 2011, 2014   Memory loss    Migraine    Opioid abuse (HCC)    Osteoporosis    PMR (polymyalgia rheumatica) (HCC) 03/17/2022   Presence of neurostimulator    Restless legs    Sleep apnea     Objective:  VITAL SIGNS: BP 110/64 (BP Location: Left Arm, Patient Position: Sitting, Cuff Size: Normal)   Pulse 71   Temp 98 F (36.7 C) (Oral)   Resp 16   Ht 5\' 4"  (1.626 m)   Wt 140 lb (63.5 kg)   SpO2 95%   BMI 24.03 kg/m  Constitutional: Appears acutely distressed in pain. HENT: Normocephalic, atraumatic.  Thorax & Lungs:  No accessory muscle use Musculoskeletal: low back.   Tenderness to palpation: yes to mild palpation over R lumbar region Deformity: no Crepitus: no Straight leg test: negative for Poor hamstring flexibility b/l. Neurologic: Normal sensory function.  Psychiatric: Normal mood. Age appropriate judgment and insight. Alert & oriented x 3.    Assessment:  Other closed fracture of first  lumbar vertebra, initial encounter (HCC) - Plan: Ambulatory referral to Neurosurgery, Ambulatory referral to Home Health  Rib pain on right side - Plan: Oxycodone HCl 10 MG TABS  Plan: Refer to NS for their opinion as the LSO brace caused more pain and she is not improving. HH PT ordered. If pain worsens and function decreases, may need to present to ED for possible admission for pain control and SNF placement.  Oxycodone resent.  Warnings about this medication verbalized and written down. F/u prn. The patient voiced understanding and agreement to the plan.  I spent 32 minutes with the patient discussing the above plans in addition to reviewing her chart on the same day of the visit.   Jilda Roche Conway, DO 07/12/23  4:42 PM

## 2023-07-12 NOTE — Patient Instructions (Addendum)
Give [place] 2-3 business days to get the results of your labs back.   Ice/cold pack over area for 10-15 min twice daily.  Heat (pad or rice pillow in microwave) over affected area, 10-15 minutes twice daily.   OK to take Tylenol 1000 mg (2 extra strength tabs) or 975 mg (3 regular strength tabs) every 6 hours as needed.  If you do not hear anything about your referrals in the next 1-2 weeks, call our office and ask for an update.  Do not drink alcohol, do any illicit/street drugs, drive or do anything that requires alertness while on this medicine.   Let us know if you need anything.

## 2023-07-13 ENCOUNTER — Other Ambulatory Visit: Payer: Self-pay | Admitting: Nurse Practitioner

## 2023-07-13 ENCOUNTER — Emergency Department (HOSPITAL_COMMUNITY)
Admission: EM | Admit: 2023-07-13 | Discharge: 2023-07-13 | Disposition: A | Discharge status: Home / Self Care | Payer: Medicare PPO | Attending: Emergency Medicine | Admitting: Emergency Medicine

## 2023-07-13 ENCOUNTER — Other Ambulatory Visit: Payer: Self-pay

## 2023-07-13 ENCOUNTER — Encounter (HOSPITAL_COMMUNITY): Payer: Self-pay

## 2023-07-13 ENCOUNTER — Ambulatory Visit: Payer: Medicare PPO | Admitting: Family Medicine

## 2023-07-13 DIAGNOSIS — M549 Dorsalgia, unspecified: Secondary | ICD-10-CM | POA: Diagnosis present

## 2023-07-13 DIAGNOSIS — M545 Low back pain, unspecified: Secondary | ICD-10-CM | POA: Insufficient documentation

## 2023-07-13 DIAGNOSIS — E039 Hypothyroidism, unspecified: Secondary | ICD-10-CM

## 2023-07-13 LAB — CBC
HCT: 38.4 % (ref 36.0–46.0)
Hemoglobin: 12.7 g/dL (ref 12.0–15.0)
MCH: 31.4 pg (ref 26.0–34.0)
MCHC: 33.1 g/dL (ref 30.0–36.0)
MCV: 95 fL (ref 80.0–100.0)
Platelets: 136 10*3/uL — ABNORMAL LOW (ref 150–400)
RBC: 4.04 MIL/uL (ref 3.87–5.11)
RDW: 12.8 % (ref 11.5–15.5)
WBC: 3.4 10*3/uL — ABNORMAL LOW (ref 4.0–10.5)
nRBC: 0 % (ref 0.0–0.2)

## 2023-07-13 LAB — BASIC METABOLIC PANEL
Anion gap: 12 (ref 5–15)
BUN: 29 mg/dL — ABNORMAL HIGH (ref 8–23)
CO2: 23 mmol/L (ref 22–32)
Calcium: 9.1 mg/dL (ref 8.9–10.3)
Chloride: 105 mmol/L (ref 98–111)
Creatinine, Ser: 1.63 mg/dL — ABNORMAL HIGH (ref 0.44–1.00)
GFR, Estimated: 33 mL/min — ABNORMAL LOW (ref >60)
Glucose, Bld: 103 mg/dL — ABNORMAL HIGH (ref 70–99)
Potassium: 3.4 mmol/L — ABNORMAL LOW (ref 3.5–5.1)
Sodium: 140 mmol/L (ref 135–145)

## 2023-07-13 MED ORDER — HEPARIN SOD (PORK) LOCK FLUSH 100 UNIT/ML IV SOLN
500.0000 [IU] | Freq: Once | INTRAVENOUS | Status: AC
  Administered 2023-07-13: 500 [IU]
  Filled 2023-07-13: qty 5

## 2023-07-13 MED ORDER — HYDROMORPHONE HCL 1 MG/ML IJ SOLN
1.0000 mg | Freq: Once | INTRAMUSCULAR | Status: AC
  Administered 2023-07-13: 1 mg via INTRAVENOUS
  Filled 2023-07-13: qty 1

## 2023-07-13 MED ORDER — HYDROMORPHONE HCL 2 MG PO TABS: 2.0000 mg | ORAL_TABLET | Freq: Three times a day (TID) | ORAL | 0 refills | Status: DC | PRN

## 2023-07-13 MED ORDER — HYDROMORPHONE HCL 2 MG PO TABS
2.0000 mg | ORAL_TABLET | Freq: Once | ORAL | Status: AC
  Administered 2023-07-13: 2 mg via ORAL
  Filled 2023-07-13: qty 1

## 2023-07-13 NOTE — ED Provider Notes (Signed)
[place] EMERGENCY DEPARTMENT AT [facility] Provider Note   CSN: [id number] Arrival date & time: 07/13/23  1018     History  Chief Complaint  Patient presents with   Back Injury    Megan Brewer is a 77 y.o. female presented to the ED with intractable pain.  The patient had a fall approximately 10 days ago was seen in the ED and diagnosed with an L1 transverse process fracture at the time.  Per discussion her neurosurgeon this was stable for discharge and outpatient follow-up.  She was fitted with a TLSO brace.  However she returns today after being evaluated PCP with concern for intractable pain.  The patient is very tearful on exam, choking out her words due to "extreme pain".  She has been taking oxycodone with no relief.  She has 17 drug allergies listed and intolerances to many other medications including NSAIDs and steroids.  The patient reports that she took off her TLSO brace because it was causing worsening pain than relief.  She reports diffuse muscle stiffness up and down her back.   She is moving her bowels regularly.  She is normally in a wheelchair at baseline due to lower extremity weakness, uses motorized scooter to get around, and her husband ports she is still able to get up and pivot.  HPI     Home Medications Prior to Admission medications   Medication Sig Start Date End Date Taking? Authorizing Provider  HYDROmorphone (DILAUDID) 2 MG tablet Take 1 tablet (2 mg total) by mouth every 8 (eight) hours as needed for up to 15 doses for severe pain (pain score 7-10). 07/13/23  Yes Seaver Machia, Kermit Balo, MD  acetaminophen (TYLENOL) 500 MG tablet Take 1,000 mg by mouth as needed for moderate pain.    [provider]  azelastine (ASTELIN) 0.1 % nasal spray Place 2 sprays into both nostrils 2 (two) times daily. 03/10/21   Hetty Blend, FNP  baclofen (LIORESAL) 10 MG tablet Take 10 mg by mouth daily as needed for muscle spasms. 06/03/20   [provider]  CALCIUM PO Take 6 tablets by mouth daily.    [provider]  cholecalciferol (VITAMIN D3) 25 MCG (1000 UNIT) tablet Take 1,000 Units by mouth daily.    [provider]  cycloSPORINE (RESTASIS) 0.05 % ophthalmic emulsion Place 1 drop into both eyes in the morning, at noon, in the evening, and at bedtime.    [provider]  denosumab (PROLIA) 60 MG/ML SOSY injection Pt to get at office.  Appt is 02/10/23 01/19/23   Sharlene Dory, DO  dexlansoprazole (DEXILANT) 60 MG capsule Take 1 capsule (60 mg total) by mouth daily. Patient needs follow up appointment for future refills. Please call [phone] to schedule an appointment. 05/07/23   Esterwood, Amy S, PA-C  diclofenac Sodium (VOLTAREN) 1 % GEL Apply 4 g topically 4 (four) times daily. 07/08/23   Durwin Glaze, MD  dicyclomine (BENTYL) 20 MG tablet Take 1 tablet (20 mg total) by mouth 2 (two) times daily as needed for spasms. 06/28/23   Sharlene Dory, DO  docusate sodium (COLACE) 100 MG capsule Take 1 capsule (100 mg total) by mouth every 12 (twelve) hours. 07/08/23   Durwin Glaze, MD  famotidine (PEPCID) 20 MG tablet Take 1 tablet (20 mg total) by mouth at bedtime. Patient needs follow up appointment for future refills. Please call [phone] to schedule an appointment. 06/21/23   Esterwood, Amy Kathie Rhodes,  PA-C  ferrous sulfate 325 (65 FE) MG tablet Take 650 mg by mouth daily with breakfast.    [provider]  fludrocortisone (FLORINEF) 0.1 MG tablet Take 1 tablet (0.1 mg total) by mouth 2 (two) times daily. 06/28/23   Wendling, Jilda Roche, DO  GAMMAGARD 20 GM/200ML SOLN Inject 40 g into the vein every 21 ( twenty-one) days. INFUSE 40G INTRAVENOUSLY EVERY 3 WEEKS 02/27/22   [provider]  heparin lock flush 100 UNIT/ML SOLN injection Inject 500 Units into the vein every 21 ( twenty-one) days. 03/31/21   [provider]  ibuprofen (ADVIL) 200 MG tablet Take 600 mg by  mouth as needed for mild pain.    [provider]  KEVZARA 200 MG/1. SOAJ Inject 1.14 mLs into the skin every 14 (fourteen) days. 09/09/22   [provider]  levothyroxine (SYNTHROID) 75 MCG tablet TAKE ONE (1) TABLET BY MOUTH EACH DAY BEFORE BREAKFAST ON EMPTY STOMACH 04/07/23   Sharlene Dory, DO  lidocaine 4 % Place 1 patch onto the skin daily. 07/08/23   Durwin Glaze, MD  magnesium oxide (MAG-OX) 400 MG tablet Take 400 mg by mouth daily.     [provider]  midodrine (PROAMATINE) 10 MG tablet Take 1 tablet (10 mg total) by mouth 3 (three) times daily. 02/24/23   Wendling, Jilda Roche, DO  MYRBETRIQ 50 MG TB24 tablet Take 50 mg by mouth daily. 10/07/22   [provider]  naloxone [facility]) nasal spray 4 mg/0.1 mL Spray up nostril in event of opioid overdose. 06/28/23   Sharlene Dory, DO  ondansetron (ZOFRAN) 8 MG tablet TAKE ONE TABLET BY MOUTH EVERY EIGHT HOURS AS NEEDED FOR NAUSEA OR VOMITING 02/15/23   Carmelia Roller, Jilda Roche, DO  Oxycodone HCl 10 MG TABS Take 1 tablet (10 mg total) by mouth every 6 (six) hours as needed ([facility]). 07/12/23   Sharlene Dory, DO  potassium chloride (KLOR-CON) 10 MEQ tablet TAKE ONE (1) TABLET BY MOUTH FOUR (4) TIMES DAILY 04/26/23   Wendling, Jilda Roche, DO  pregabalin (LYRICA) 50 MG capsule Take 1 capsule (50 mg total) by mouth 3 (three) times daily. 04/07/23   Sharlene Dory, DO  prochlorperazine (COMPAZINE) 10 MG tablet Take 1 tablet (10 mg total) by mouth 2 (two) times daily as needed for nausea or vomiting. 04/05/23   Arthor Captain, PA-C  pyridoxine (B-6) 100 MG tablet Take 100 mg by mouth daily.    Sharlene Dory, DO  rOPINIRole (REQUIP) 0.5 MG tablet Take 0.5 mg by mouth 3 (three) times daily.    Sharlene Dory, DO  sucralfate (CARAFATE) 1 GM/10ML suspension TAKE 10 MLS (1 G TOTAL) BY MOUTH 4 TIMESDAILY AS NEEDED 01/25/23   Sharlene Dory, DO  torsemide 40 MG TABS Take 20 mg by mouth 2 (two) times daily. 03/31/23   Carlos Levering, NP  triamcinolone cream (KENALOG) 0.1 % Apply 1 application  topically 2 (two) times daily as needed (rash).    [provider]  vitamin B-12 (CYANOCOBALAMIN) 1000 MCG tablet Take 1,000 mcg by mouth daily. 02/14/21   [provider]  Vitamin D, Ergocalciferol, (DRISDOL) 1.25 MG (50000 UNIT) CAPS capsule TAKE 1 CAPSULE ONCE WEEKLY 01/04/23   [name], Drema Pry, MD  ZINC OXIDE, TOPICAL, 10 % CREA Apply 1 application topically 2 (two) times daily as needed (rash). 02/14/21   Glade Lloyd, MD      Allergies    Butorphanol, Erythromycin base,  Pregabalin, Pregabalin er, Propoxyphene, Sumatriptan, Tetracycline, Ciprofloxacin, Corticosteroids, Doxycycline, Cefixime, Gabapentin, Isometheptene-dichloral-apap, Ketorolac, Prednisone, Promethazine, and Tape    Review of Systems   Review of Systems  Physical Exam Updated Vital Signs BP (!) 138/115   Pulse 97   Temp 98.2 F (36.8 C)   Resp 16   Ht 5\' 4"  (1.626 m)   Wt 62.1 kg   SpO2 99%   BMI 23.52 kg/m  Physical Exam Constitutional:      General: She is not in acute distress. HENT:     Head: Normocephalic and atraumatic.  Eyes:     Conjunctiva/sclera: Conjunctivae normal.     Pupils: Pupils are equal, round, and reactive to light.  Cardiovascular:     Rate and Rhythm: Normal rate and regular rhythm.  Pulmonary:     Effort: Pulmonary effort is normal. No respiratory distress.  Abdominal:     General: There is no distension.     Tenderness: There is no abdominal tenderness.  Musculoskeletal:     Comments: Diffuse bilateral muscle tenderness, back tenderness to palpation, pain worse in the  Skin:    General: Skin is warm and dry.  Neurological:     General: No focal deficit present.     Mental Status: She is alert and oriented to person, place, and time. Mental status is at baseline.  Psychiatric:        Mood and  Affect: Mood normal.        Behavior: Behavior normal.     ED Results / Procedures / Treatments   Labs (all labs ordered are listed, but only abnormal results are displayed) Labs Reviewed  BASIC METABOLIC PANEL - Abnormal; Notable for the following components:      Result Value   Potassium 3.4 (*)    Glucose, Bld 103 (*)    BUN 29 (*)    Creatinine, Ser 1.63 (*)    GFR, Estimated 33 (*)    All other components within normal limits  CBC - Abnormal; Notable for the following components:   WBC 3.4 (*)    Platelets 136 (*)    All other components within normal limits    EKG None  Radiology No results found.  Procedures Procedures    Medications Ordered in ED Medications  HYDROmorphone (DILAUDID) injection 1 mg (1 mg Intravenous Given 07/13/23 1245)  HYDROmorphone (DILAUDID) tablet 2 mg (2 mg Oral Given 07/13/23 1404)  heparin lock flush 100 unit/mL (500 Units Intracatheter Given 07/13/23 1529)    ED Course/ Medical Decision Making/ A&P Clinical Course as of 07/13/23 2019  Tue Jul 13, 2023  1357 Pain did improve somewhat with IV Dilaudid.  Will now try an oral Dilaudid tablet which I can potentially discharge the patient with for her acute pain syndrome [MT]    Clinical Course User Index [MT] Milly Goggins, Kermit Balo, MD                                 Medical Decision Making Amount and/or Complexity of Data Reviewed Labs: ordered.  Risk Prescription drug management.   Patient is presenting with intractable pain in her back after an L1 transverse process fracture.  She is neurovascularly intact.  Low suspicion for spinal cord injury, or indication for emergent MRI.  Her husband provides supplemental history.  I reviewed her external records including prior ED visit and CT imaging in the emergency department.  IV Dilaudid was given  for pain control in the ED.  Basic labs with no emergent findings; labs at baseline level  On reassessment pain improved  Okay for  discharge home with oral dilaudid short prescription and pt to follow up with spine or orthopedic specialist.  Generally we would want to avoid narcotics and they understand this is not a long-term solution, but given the debilitating level of pain she is experiencing and lack of sleep, this is necessary in the short term.  Hopefully her pain improves as this bone mends over the next 4-8 weeks.        Final Clinical Impression(s) / ED Diagnoses Final diagnoses:  Acute low back pain, unspecified back pain laterality, unspecified whether sciatica present    Rx / DC Orders ED Discharge Orders          Ordered    HYDROmorphone (DILAUDID) 2 MG tablet  Every 8 hours PRN        07/13/23 1515              Terald Sleeper, MD 07/13/23 2020

## 2023-07-13 NOTE — ED Notes (Signed)
ED Provider at bedside. 

## 2023-07-13 NOTE — ED Triage Notes (Signed)
Pt arrived for back injury, L1. Injury was 10days ago, patient was seen here and d/c. Patient is on pain meds and reports its not working. States took last oxycodone prior tot arrival. Megan Brewer hurts all the way up to her neck. Denies new injury

## 2023-07-13 NOTE — Care Management (Signed)
Transition of Care [facility]) - Emergency Department Mini Assessment   Patient Details  Name: Megan Brewer MRN: [id number] Date of Birth: 05/14/1946  [facility] CM/SW Contact:    Lavenia Atlas, RN Phone Number: 07/13/2023, 12:31 PM   Clinical Narrative: Received call from Tresa Endo with [facility] who reports patient is active with [facility] for HHPT/OT services. Will need HH orders prior to discharge. Per chart review patient was seen in ED on 11/21 and presented to [facility] for the same.   No TOC consult on file, will follow for discharge needs.     ED Mini Assessment: What brought you to the Emergency Department? : lower back pain due to previous fall 10 days ago  Barriers to Discharge: Continued Medical Work up  [employer] interventions: following for home health services     Interventions which prevented an admission or readmission: Home Health Consult or Services    Patient Contact and Communications        ,                 Admission diagnosis:  back pain Patient Active Problem List   Diagnosis Date Noted   Orthostatic hypotension 12/22/2022   Near syncope 12/22/2022   Bipolar mixed affective disorder, moderate (HCC) 08/25/2022   Tremor 08/25/2022   Polyarthralgia 05/04/2022   Polymyalgia rheumatica (HCC) 03/25/2022   Thrombocytopenia, unspecified (HCC) 12/30/2021   Upper airway cough syndrome with ? VCD  06/26/2021   Precordial chest pain 06/05/2021   Moderate mitral regurgitation 06/05/2021   Essential hypertension 06/05/2021   DOE (dyspnea on exertion) 06/05/2021   Medication monitoring encounter 04/01/2021   Gram-negative bacteremia 03/03/2021   Rigors 02/04/2021   Elevated troponin 01/14/2021   Hypokalemia 01/14/2021   Headache 01/14/2021   Chest discomfort 03/22/2019   Weight loss 01/27/2019   Acquired hypothyroidism 01/27/2019   Esophagitis 01/27/2019   Intractable migraine without status migrainosus 01/27/2019    Osteoporosis 01/27/2019   Chronic sinusitis 12/28/2018   Food intolerance/GI symptoms 12/28/2018   Acute maxillary sinusitis 12/28/2018   Lower leg edema 12/20/2018   Chronic pain of both shoulders 05/24/2018   Bilateral leg edema 05/24/2018   Chronic pain syndrome 03/31/2018   Left bundle branch block 10/28/2017   Palpitations 10/28/2017   History of gastric bypass 05/21/2017   Polypharmacy 02/11/2017   Senile nuclear sclerosis 11/26/2016   Chronic post-thoracotomy pain 04/09/2014   Chronic kidney disease 04/09/2014   Sleep disturbances 09/20/2012   Restless legs syndrome 07/07/2012   Hypertonicity of bladder 05/23/2012   Essential tremor 04/01/2012   Memory loss 04/01/2012   Incomplete emptying of bladder 01/14/2012   Urge incontinence 10/28/2011   Tension type headache 06/10/2011   Mixed urge and stress incontinence 05/28/2011   Hypogammaglobulinemia (HCC) 06/22/2010   Severe episode of recurrent major depressive disorder, without psychotic features (HCC) 06/22/2010   Hypothyroidism 02/12/2010   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/2010   Meningitis 01/31/2010   OBSTRUCTIVE SLEEP APNEA 01/31/2010   Chronic rhinitis 01/31/2010   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 [place] [address] Phone: [phone] Fax: [phone]  [id number] - [place], [place] - [address] AT [facility] [address] [place]  [address] Phone: [phone] Fax: [phone]  [id number] - [place], [place] - [address] AT [facility] [address] [place] [address] Phone: [phone] Fax: [phone]

## 2023-07-13 NOTE — Discharge Instructions (Signed)
Please follow-up with the neurosurgeon in the clinic for your spine fracture.  You were given opiate medications today, and you should not drive.

## 2023-07-13 NOTE — Telephone Encounter (Signed)
Pt ready for scheduling.

## 2023-07-13 NOTE — ED Notes (Signed)
Pt requested to use the restroom prior to having port accessed.  Pt taken to restroom in a wheelchair.

## 2023-07-14 ENCOUNTER — Other Ambulatory Visit (HOSPITAL_COMMUNITY): Payer: Self-pay

## 2023-07-14 ENCOUNTER — Telehealth: Payer: Self-pay

## 2023-07-14 ENCOUNTER — Other Ambulatory Visit: Payer: Self-pay

## 2023-07-14 MED ORDER — DENOSUMAB 60 MG/ML ~~LOC~~ SOSY
PREFILLED_SYRINGE | SUBCUTANEOUS | 0 refills | Status: AC
  Filled 2023-07-14: qty 1, fill #0
  Filled 2023-08-13: qty 1, 180d supply, fill #0

## 2023-07-14 NOTE — Telephone Encounter (Signed)
Patient will get at office visit on 08/27/23

## 2023-07-14 NOTE — Telephone Encounter (Signed)
Pharmacy Patient Advocate Encounter  Insurance verification completed.    The patient is insured through [employer] claim for [employer]. Currently a quantity of is a 180 day supply and the co-pay is $0 .   This test claim was processed through [facility]- copay amounts may vary at other pharmacies due to pharmacy/plan contracts, or as the patient moves through the different stages of their insurance plan.

## 2023-07-14 NOTE — Addendum Note (Signed)
Addended by: Thelma Barge D on: 07/14/2023 04:57 PM   Modules accepted: Orders

## 2023-07-14 NOTE — Telephone Encounter (Signed)
Will send to pharmacy

## 2023-07-16 ENCOUNTER — Other Ambulatory Visit: Payer: Self-pay

## 2023-07-16 ENCOUNTER — Other Ambulatory Visit: Payer: Self-pay | Admitting: Family Medicine

## 2023-07-19 ENCOUNTER — Other Ambulatory Visit: Payer: Self-pay

## 2023-07-19 ENCOUNTER — Telehealth: Payer: Self-pay | Admitting: Physician Assistant

## 2023-07-19 ENCOUNTER — Other Ambulatory Visit: Payer: Self-pay | Admitting: Neurology

## 2023-07-19 DIAGNOSIS — E039 Hypothyroidism, unspecified: Secondary | ICD-10-CM

## 2023-07-19 MED ORDER — DEXLANSOPRAZOLE 60 MG PO CPDR: 60.0000 mg | DELAYED_RELEASE_CAPSULE | Freq: Every day | ORAL | 0 refills | Status: DC

## 2023-07-19 MED ORDER — LEVOTHYROXINE SODIUM 75 MCG PO TABS: ORAL_TABLET | ORAL | 1 refills | Status: DC

## 2023-07-19 NOTE — Telephone Encounter (Signed)
Patient called to request a refill on Dexilant.

## 2023-07-19 NOTE — Telephone Encounter (Signed)
Dexilant was sent to [facility] with a 15 day supply.  Patient has not been seen since 04-2022.  Patient needs follow up appointment for future refills.

## 2023-07-20 ENCOUNTER — Ambulatory Visit: Payer: Medicare PPO | Admitting: Family Medicine

## 2023-07-20 NOTE — Telephone Encounter (Signed)
Pt will get her injection at her 08/27/23 OV.

## 2023-07-21 ENCOUNTER — Telehealth: Payer: Self-pay | Admitting: Family Medicine

## 2023-07-21 NOTE — Telephone Encounter (Signed)
Pt called and requested to speak with you about a "mix up of her prescriptions" . Please call and advise at [phone].

## 2023-07-21 NOTE — Progress Notes (Signed)
In error

## 2023-07-22 ENCOUNTER — Telehealth: Payer: Self-pay | Admitting: Family Medicine

## 2023-07-22 MED ORDER — PREGABALIN 50 MG PO CAPS: 50.0000 mg | ORAL_CAPSULE | Freq: Three times a day (TID) | ORAL | 4 refills | Status: DC

## 2023-07-22 NOTE — Telephone Encounter (Signed)
Will update her allergy list.

## 2023-07-22 NOTE — Progress Notes (Signed)
  Care Coordination Note  07/22/2023 Name: Cierre Palo MRN: [id number] DOB: 14-Nov-1945  Lillia Mountain Gasparini is a 77 y.o. year old female who is a primary care patient of Sharlene Dory, DO and is actively engaged with the care management team. I reached out to [employer] by phone today to assist with scheduling a follow up visit with the RN Case Manager  Follow up plan: Telephone appointment with care management team member scheduled for:07/27/2023  Burman Nieves, [facility] Care Coordination Care Guide Direct Dial: [phone]

## 2023-07-22 NOTE — Addendum Note (Signed)
Addended by: Radene Gunning on: 07/22/2023 04:49 AM   Modules accepted: Orders

## 2023-07-22 NOTE — Telephone Encounter (Signed)
Radovan, occupational therapist with [facility], called and stated that the patient was scheduled for a visit with him today. However, her husband canceled the visit due to the patient having the flu.   Festus Barren is requesting another verbal order since they will have to cancel for this week and resume the week after next. Please advise at the secured VM of [phone].

## 2023-07-23 NOTE — Telephone Encounter (Signed)
Verbal order given to Radovan.

## 2023-07-26 ENCOUNTER — Encounter: Payer: Self-pay | Admitting: Physician Assistant

## 2023-07-26 ENCOUNTER — Ambulatory Visit: Payer: Medicare PPO | Admitting: Physician Assistant

## 2023-07-26 VITALS — BP 120/68 | HR 76 | Wt 141.0 lb

## 2023-07-26 DIAGNOSIS — Z79899 Other long term (current) drug therapy: Secondary | ICD-10-CM

## 2023-07-26 DIAGNOSIS — M353 Polymyalgia rheumatica: Secondary | ICD-10-CM

## 2023-07-26 DIAGNOSIS — K5904 Chronic idiopathic constipation: Secondary | ICD-10-CM | POA: Diagnosis not present

## 2023-07-26 DIAGNOSIS — R413 Other amnesia: Secondary | ICD-10-CM | POA: Diagnosis not present

## 2023-07-26 DIAGNOSIS — K219 Gastro-esophageal reflux disease without esophagitis: Secondary | ICD-10-CM | POA: Diagnosis not present

## 2023-07-26 DIAGNOSIS — D696 Thrombocytopenia, unspecified: Secondary | ICD-10-CM

## 2023-07-26 DIAGNOSIS — K6282 Dysplasia of anus: Secondary | ICD-10-CM

## 2023-07-26 MED ORDER — DEXLANSOPRAZOLE 60 MG PO CPDR: 60.0000 mg | DELAYED_RELEASE_CAPSULE | Freq: Every day | ORAL | 3 refills | Status: DC

## 2023-07-26 MED ORDER — SUCRALFATE 1 GM/10ML PO SUSP: 1.0000 g | Freq: Four times a day (QID) | ORAL | 3 refills | Status: DC | PRN

## 2023-07-26 MED ORDER — FAMOTIDINE 20 MG PO TABS: 20.0000 mg | ORAL_TABLET | Freq: Every day | ORAL | 3 refills | Status: DC

## 2023-07-26 NOTE — Patient Instructions (Addendum)
We are going to call you too get scheduled for a follow up.   Break apart the dexilant to sprinkle in apple sauce Can take the pepcid up to twice a day, take every evening but can take during the day if you have GERD.  Continue to take the carafate as needed.   Linzess 72 mcg this is for AB pain and constipation *IBS-C patients may begin to experience relief from belly pain and overall abdominal symptoms (pain, discomfort, and bloating) in about 1 week,  with symptoms typically improving over 12 weeks.  Take at least 30 minutes before the first meal of the day on an empty stomach You can have a loose stool if you eat a high-fat breakfast. Give it at least 7 days, may have more bowel movements during that time.   The diarrhea should go away and you should start having normal, complete, full bowel movements.  It may be helpful to start treatment when you can be near the comfort of your own bathroom, such as a weekend.  After you are out we can send in a prescription if you did well, there is a prescription card  _______________________________________________________  If your blood pressure at your visit was 140/90 or greater, please contact your primary care physician to follow up on this.  _______________________________________________________  If you are age 75 or older, your body mass index should be between 23-30. Your Body mass index is 24.2 kg/m. If this is out of the aforementioned range listed, please consider follow up with your Primary Care Provider.  If you are age 69 or younger, your body mass index should be between 19-25. Your Body mass index is 24.2 kg/m. If this is out of the aformentioned range listed, please consider follow up with your Primary Care Provider.   ________________________________________________________  The [facility] providers would like to encourage you to use [facility] to communicate with providers for non-urgent requests or questions.  Due to long  hold times on the telephone, sending your provider a message by [facility] may be a faster and more efficient way to get a response.  Please allow 48 business hours for a response.  Please remember that this is for non-urgent requests.  _______________________________________________________ It was a pleasure to see you today!  Thank you for trusting me with your gastrointestinal care!

## 2023-07-26 NOTE — Progress Notes (Signed)
07/26/2023 Carolyne Zierke [id number] 1946-07-10  Referring provider: Sharlene Dory* Primary GI doctor: Dr. Adela Lank  ASSESSMENT AND PLAN:   Gastroesophageal reflux disease with history of Roux-en-Y No dysphagia, melena, red flag symptoms Continue Dexilant open capsule sprinkle onto food, continue Pepcid 20 mg at night can also take 1 during the day. Can take Carafate as needed. Refilled all of these medications Information given to the patient.  Chronic idiopathic constipation Unable to tolerate MiraLAX can have up to 7 days without a bowel movement, patient's had worsening tremors and imbalance in wheelchair having some worsening constipation with decreased movement.  Will do small trial of Linzess 72 mcg.  Continue following up with neurology.  Polymyalgia rheumatica (HCC) Continue follow-up with rheumatology  Thrombocytopenia, unspecified (HCC) Noncontrast CT renal stone previous cholecystectomy mild biliary ductal dilation no liver lesion, mild pancreatic atrophy normal spleen size. Likely related to medications with polypharmacy  Memory loss, with tremor, possible PMR Has follow-up with [facility] Currently in wheelchair has some aphasia and memory issues. No plans for endoscopic evaluation  AIN (anal intraepithelial neoplasia) anal canal May 2021 had AIN of hypertrophied anal papillae.  Patient denies any rectal bleeding, rectal pain declines rectal exam today I am still unable to see where she was sent to [facility] or have seen them in the past.  Will get nurse to check and place referral and send if she is not seeing them. Recallcolon 2027 will evaluate patient's health at that time.  Patient Care Team: Sharlene Dory, DO as PCP - General (Family Medicine) Rollene Rotunda, MD as PCP - Cardiology (Cardiology) Glendale Chard, DO as Consulting Physician (Neurology) Colletta Maryland, RN as [facility] Care Management  HISTORY  OF PRESENT ILLNESS: 77 y.o. female with a past medical history of sleep apnea, restless leg syndrome, depression, lung cancer for which she underwent resection and is status post Roux-en-Y gastric bypass. She also has an IgG deficiency. She had had an episode of C. difficile colitis in May 2022 and others listed below presents for evaluation of GERD and constipation.   Last colonoscopy and EGD both done May 2021.  EGD pertinent for mild esophagitis, there was a small visible staple removed from the stomach. At colonoscopy she had 1 small adenomatous polyp and is indicated for 7-year interval follow-up. She also was found to have anal AIN in a biopsy of hypertrophied anal papillae.  She was referred to [facility]   04/30/2022 office visit with Mike Gip PA for GERD worsened with prednisone for PMR instructed to open Dexilant capsule to help with absorption with previous Roux-en-Y. Recall colonoscopy May 2028. Referred to [facility] for previous AIN  She has been on dexilant daily, breaks apart and uses in the morning. She is on pepcid 20 mg at night. She will take carafate as needed, can go several weeks without and then will have several days she will need to take the carafate for a day or three.   Can have AB pain with the GERD, carafate helps this too.  Will take goody's once a month with dulcolax for constipation, can have up to 7 days without BM, normally every other day. She can not take miralax due to taste.  Denies dysphagia, melena.  She is in wheel chair, has had worsening dementia/tremors, trouble with word finding and following with neurology. She is now on midodrine and has follow up with [facility] for evaluation for imbalance.   She  reports that  she has never smoked. She has never used smokeless tobacco. She reports that she does not currently use alcohol. She reports that she does not use drugs.  RELEVANT LABS AND IMAGING:  Results          CBC    Component Value Date/Time   WBC 3.4  (L) 07/13/2023 1248   RBC 4.04 07/13/2023 1248   HGB 12.7 07/13/2023 1248   HCT 38.4 07/13/2023 1248   PLT 136 (L) 07/13/2023 1248   MCV 95.0 07/13/2023 1248   MCH 31.4 07/13/2023 1248   MCHC 33.1 07/13/2023 1248   RDW 12.8 07/13/2023 1248   LYMPHSABS 1.4 07/08/2023 1347   MONOABS 0.5 07/08/2023 1347   EOSABS 0.1 07/08/2023 1347   BASOSABS 0.1 07/08/2023 1347   Recent Labs    02/09/23 1738 02/18/23 1256 02/21/23 1906 03/24/23 1429 04/05/23 1258 04/26/23 0949 05/20/23 1334 06/26/23 1442 07/08/23 1347 07/13/23 1248  HGB 12.9 11.3* 11.8* 11.7* 13.0 12.5 13.2 13.2 12.2 12.7    CMP     Component Value Date/Time   NA 140 07/13/2023 1248   NA 140 11/20/2021 0956   K 3.4 (L) 07/13/2023 1248   CL 105 07/13/2023 1248   CO2 23 07/13/2023 1248   GLUCOSE 103 (H) 07/13/2023 1248   BUN 29 (H) 07/13/2023 1248   BUN 17 11/20/2021 0956   CREATININE 1.63 (H) 07/13/2023 1248   CREATININE 1.55 (H) 04/01/2022 0917   CALCIUM 9.1 07/13/2023 1248   PROT 6.6 07/08/2023 1347   ALBUMIN 2.8 (L) 07/08/2023 1347   AST 44 (H) 07/08/2023 1347   ALT 44 07/08/2023 1347   ALKPHOS 56 07/08/2023 1347   BILITOT 0.4 07/08/2023 1347   GFRNONAA 33 (L) 07/13/2023 1248   GFRNONAA 33 (L) 06/10/2020 1549   GFRAA 38 (L) 06/10/2020 1549      Latest Ref Rng & Units 07/08/2023    1:47 PM 06/26/2023    2:42 PM 05/20/2023    1:34 PM  Hepatic Function  Total Protein 6.5 - 8.1 g/dL 6.6  6.4  6.4   Albumin 3.5 - 5.0 g/dL 2.8  3.0  3.4   AST 15 - 41 U/L 44  58  53   ALT 0 - 44 U/L 44  66  47   Alk Phosphatase 38 - 126 U/L 56  54  55   Total Bilirubin <1.2 mg/dL 0.4  0.2  0.5       Current Medications:   Current Outpatient Medications (Endocrine & Metabolic):    denosumab (PROLIA) 60 MG/ML SOSY injection, Pt to get at office.  Appt is 08/27/23   fludrocortisone (FLORINEF) 0.1 MG tablet, Take 1 tablet (0.1 mg total) by mouth 2 (two) times daily.   levothyroxine (SYNTHROID) 75 MCG tablet, TAKE ONE (1)  TABLET BY MOUTH EACH DAY BEFORE BREAKFAST ON EMPTY STOMACH  Current Facility-Administered Medications (Endocrine & Metabolic):    denosumab (PROLIA) injection 60 mg  Facility-Administered Medications Ordered in Other Visits (Endocrine & Metabolic):    methylPREDNISolone sodium succinate (SOLU-MEDROL) 1,000 mg in sodium chloride 0.9 % 50 mL IVPB   methylPREDNISolone sodium succinate (SOLU-MEDROL) 1,000 mg in sodium chloride 0.9 % 50 mL IVPB  Current Outpatient Medications (Cardiovascular):    midodrine (PROAMATINE) 10 MG tablet, Take 1 tablet (10 mg total) by mouth 3 (three) times daily.   torsemide 40 MG TABS, Take 20 mg by mouth 2 (two) times daily.   Current Outpatient Medications (Respiratory):    azelastine (ASTELIN) 0.1 %  nasal spray, Place 2 sprays into both nostrils 2 (two) times daily.   Current Outpatient Medications (Analgesics):    acetaminophen (TYLENOL) 500 MG tablet, Take 1,000 mg by mouth as needed for moderate pain.   HYDROmorphone (DILAUDID) 2 MG tablet, Take 1 tablet (2 mg total) by mouth every 8 (eight) hours as needed for up to 15 doses for severe pain (pain score 7-10).   ibuprofen (ADVIL) 200 MG tablet, Take 600 mg by mouth as needed for mild pain.   KEVZARA 200 MG/1. SOAJ, Inject 1.14 mLs into the skin every 14 (fourteen) days.   Oxycodone HCl 10 MG TABS, Take 1 tablet (10 mg total) by mouth every 6 (six) hours as needed (Breakthrough Pain).   Current Outpatient Medications (Hematological):    ferrous sulfate 325 (65 FE) MG tablet, Take 650 mg by mouth daily with breakfast.   heparin lock flush 100 UNIT/ML SOLN injection, Inject 500 Units into the vein every 21 ( twenty-one) days.   vitamin B-12 (CYANOCOBALAMIN) 1000 MCG tablet, Take 1,000 mcg by mouth daily.   Current Outpatient Medications (Other):    baclofen (LIORESAL) 10 MG tablet, Take 10 mg by mouth daily as needed for muscle spasms.   CALCIUM PO, Take 6 tablets by mouth daily.    cholecalciferol (VITAMIN D3) 25 MCG (1000 UNIT) tablet, Take 1,000 Units by mouth daily.   cycloSPORINE (RESTASIS) 0.05 % ophthalmic emulsion, Place 1 drop into both eyes in the morning, at noon, in the evening, and at bedtime.   dexlansoprazole (DEXILANT) 60 MG capsule, Take 1 capsule (60 mg total) by mouth daily. Patient needs follow up appointment for future refills. Please call [phone] to schedule an appointment.   diclofenac Sodium (VOLTAREN) 1 % GEL, Apply 4 g topically 4 (four) times daily.   dicyclomine (BENTYL) 20 MG tablet, Take 1 tablet (20 mg total) by mouth 2 (two) times daily as needed for spasms.   docusate sodium (COLACE) 100 MG capsule, Take 1 capsule (100 mg total) by mouth every 12 (twelve) hours.   famotidine (PEPCID) 20 MG tablet, Take 1 tablet (20 mg total) by mouth at bedtime. Patient needs follow up appointment for future refills. Please call [phone] to schedule an appointment.   GAMMAGARD 20 GM/200ML SOLN, Inject 40 g into the vein every 21 ( twenty-one) days. INFUSE 40G INTRAVENOUSLY EVERY 3 WEEKS   lidocaine 4 %, Place 1 patch onto the skin daily.   magnesium oxide (MAG-OX) 400 MG tablet, Take 400 mg by mouth daily.    MYRBETRIQ 50 MG TB24 tablet, Take 50 mg by mouth daily.   naloxone (NARCAN) nasal spray 4 mg/0.1 mL, Spray up nostril in event of opioid overdose.   ondansetron (ZOFRAN) 8 MG tablet, TAKE ONE TABLET BY MOUTH EVERY EIGHT HOURS AS NEEDED FOR NAUSEA OR VOMITING   potassium chloride (KLOR-CON) 10 MEQ tablet, TAKE ONE (1) TABLET BY MOUTH FOUR (4) TIMES DAILY   pregabalin (LYRICA) 50 MG capsule, Take 1 capsule (50 mg total) by mouth 3 (three) times daily.   pregabalin (LYRICA) 50 MG capsule, Take 1 capsule (50 mg total) by mouth 3 (three) times daily.   prochlorperazine (COMPAZINE) 10 MG tablet, Take 1 tablet (10 mg total) by mouth 2 (two) times daily as needed for nausea or vomiting.   pyridoxine (B-6) 100 MG tablet, Take 100 mg by mouth daily.    rOPINIRole (REQUIP) 0.5 MG tablet, Take 0.5 mg by mouth 3 (three) times daily.   sucralfate (CARAFATE) 1 GM/10ML suspension,  TAKE 10 MLS (1 GIVE TOTAL) BY MOUTH 4 TIMES DAILY AS NEEDED   triamcinolone cream (KENALOG) 0.1 %, Apply 1 application  topically 2 (two) times daily as needed (rash).   Vitamin D, Ergocalciferol, (DRISDOL) 1.25 MG (50000 UNIT) CAPS capsule, TAKE 1 CAPSULE ONCE WEEKLY   ZINC OXIDE, TOPICAL, 10 % CREA, Apply 1 application topically 2 (two) times daily as needed (rash).   Medical History:  Past Medical History:  Diagnosis Date   Anemia    Arachnoiditis    Bipolar disorder (HCC)    Cataract    removed   Chronic post-thoracotomy pain    CKD (chronic kidney disease)    GERD (gastroesophageal reflux disease)    Hypogammaglobulinemia (HCC)    Hypotension    Hypothyroid    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HCC)    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HCC)    Lung cancer (HCC) 2011, 2014   Memory loss    Migraine    Opioid abuse (HCC)    Osteoporosis    PMR (polymyalgia rheumatica) (HCC) 03/17/2022   Presence of neurostimulator    Restless legs    Sleep apnea    Allergies:  Allergies  Allergen Reactions   Butorphanol Hives, Other (See Comments) and Swelling    Cerebral pain, "like someone is brushing my brain with a brillo pad"  Other Reaction(s): Not available   Erythromycin Base Diarrhea, Itching and Other (See Comments)    Severe diarrhea   Propoxyphene Nausea Only and Other (See Comments)    Depression, Agitation, Blurred Vision  Other Reaction(s): Not available   Sumatriptan Nausea And Vomiting, Other (See Comments) and Tinitus    Ringing in the ears, migraines are worse  Other Reaction(s): Not available   Tetracycline Nausea Only, Other (See Comments) and Tinitus    Causes ringing in ears, dizziness, migraine, nausea  Other Reaction(s): Not available   Ciprofloxacin Hives, Other (See Comments) and Tinitus    Headache, dizziness, ringing in the  ears  Other Reaction(s): Not available   Corticosteroids Other (See Comments)    12/02/2016 interview by GMW: Oral dosing causes migraines/nausea/tinnitus. Prev tolerated IV and intranasal admin w/o difficulty.   Doxycycline Other (See Comments)    unknown  Other Reaction(s): Not available   Cefixime Other (See Comments)    Headache  Other Reaction(s): Not available   Isometheptene-Dichloral-Apap Other (See Comments)    Unknown, mood liability   Ketorolac     12/02/2016 interview by NUU: Oral dosing prednisone/corticosteroids causes migraines/nausea/tinnitus. Prev tolerated IV and intranasal admin w/o difficulty.  Other Reaction(s): Not available   Prednisone Other (See Comments)    Migraine, dizzy, ringing in ears-can take it IV  12/02/2016 interview by [name]: Oral prednisone dosing causes migraines/nausea/tinnitus. Prev tolerated IV and intranasal admin w/o difficulty.  Other Reaction(s): Not available   Promethazine Other (See Comments)    causes severe abdominal pain when taken IV; however she can take PO or IM  Other Reaction(s): Not available   Tape Other (See Comments)    Adhesive tapes-patient denies     Surgical History:  She  has a past surgical history that includes Gastric bypass; Spinal cord stimulator implant; Cholecystectomy; Abdominal hysterectomy; Carpal tunnel release; Cataract extraction (Bilateral, 2019); Colonoscopy (2015); DIAGNOSTIC MAMMOGRAM (2019); CT LUNG SCREENING (2018); DG  BONE DENSITY ([facility] HX) (2018); Lung cancer surgery (02/2010); Multiple tooth extractions; Port-a-cath removal (Left, 02/07/2021); Port a cath revision (Right, 04/2021); and Dental surgery (06/17/2021). Family History:  Her family history includes Arthritis in her father  and mother; Asthma in her daughter and sister; Bipolar disorder in her brother and mother; Congestive Heart Failure in her father; Dementia in her father and mother; Depression in her father and sister; GER disease in  her mother; Hypertension in her mother; Osteoporosis in her mother; Parkinson's disease in her father; Pneumonia in her father.  REVIEW OF SYSTEMS  : All other systems reviewed and negative except where noted in the History of Present Illness.  PHYSICAL EXAM: There were no vitals taken for this visit. General Appearance: Chronically ill-appearing female, in wheelchair no apparent distress. Head:   Normocephalic and atraumatic. Eyes:  sclerae anicteric,conjunctive pink  Respiratory: Respiratory effort normal, BS equal bilaterally without rales, rhonchi, wheezing. Cardio: RRR with no MRGs. Peripheral pulses intact.  Abdomen: Soft,  Obese ,active bowel sounds. No tenderness .Marland Kitchen No masses. Rectal: declines Musculoskeletal: Full ROM, Not tested gait, patient in wheelchair due to imbalance. With edema. Skin:  Dry and intact without significant lesions or rashes Neuro: Alert and  oriented x4, poor short-term memory and some aphasia;  No focal deficits. Psych:  Cooperative. Normal mood and affect.    Doree Albee, PA-C 11:02 AM

## 2023-07-26 NOTE — Progress Notes (Signed)
Diagnosis: Polyarthralgia  Provider:  Chilton Greathouse MD  Procedure: IV Infusion  IV Type: Port a Cath, IV Location: R Chest  Solumedrol (Methylprednisolone), Dose: 1000 mg  Infusion Start Time: 1502  Infusion Stop Time: 1610  Post Infusion IV Care: Peripheral IV Discontinued  Discharge: Condition: Good, Destination: Home . AVS Provided  Performed by:  Adriana Mccallum RN

## 2023-07-27 ENCOUNTER — Emergency Department (HOSPITAL_COMMUNITY): Payer: Medicare PPO

## 2023-07-27 ENCOUNTER — Encounter (HOSPITAL_COMMUNITY): Payer: Self-pay

## 2023-07-27 ENCOUNTER — Emergency Department (HOSPITAL_COMMUNITY)
Admission: EM | Admit: 2023-07-27 | Discharge: 2023-07-27 | Disposition: A | Discharge status: Home / Self Care | Payer: Medicare PPO | Attending: Emergency Medicine | Admitting: Emergency Medicine

## 2023-07-27 DIAGNOSIS — W231XXA Caught, crushed, jammed, or pinched between stationary objects, initial encounter: Secondary | ICD-10-CM | POA: Diagnosis not present

## 2023-07-27 DIAGNOSIS — N189 Chronic kidney disease, unspecified: Secondary | ICD-10-CM | POA: Diagnosis not present

## 2023-07-27 DIAGNOSIS — M25511 Pain in right shoulder: Secondary | ICD-10-CM | POA: Diagnosis present

## 2023-07-27 DIAGNOSIS — M79621 Pain in right upper arm: Secondary | ICD-10-CM | POA: Diagnosis not present

## 2023-07-27 DIAGNOSIS — D631 Anemia in chronic kidney disease: Secondary | ICD-10-CM | POA: Diagnosis not present

## 2023-07-27 DIAGNOSIS — S2241XD Multiple fractures of ribs, right side, subsequent encounter for fracture with routine healing: Secondary | ICD-10-CM | POA: Diagnosis not present

## 2023-07-27 DIAGNOSIS — D803 Selective deficiency of immunoglobulin G [IgG] subclasses: Secondary | ICD-10-CM | POA: Diagnosis not present

## 2023-07-27 DIAGNOSIS — M62838 Other muscle spasm: Secondary | ICD-10-CM | POA: Insufficient documentation

## 2023-07-27 DIAGNOSIS — E039 Hypothyroidism, unspecified: Secondary | ICD-10-CM | POA: Diagnosis not present

## 2023-07-27 DIAGNOSIS — I951 Orthostatic hypotension: Secondary | ICD-10-CM | POA: Diagnosis not present

## 2023-07-27 DIAGNOSIS — M79603 Pain in arm, unspecified: Secondary | ICD-10-CM | POA: Diagnosis not present

## 2023-07-27 DIAGNOSIS — N1831 Chronic kidney disease, stage 3a: Secondary | ICD-10-CM | POA: Diagnosis not present

## 2023-07-27 DIAGNOSIS — Z85118 Personal history of other malignant neoplasm of bronchus and lung: Secondary | ICD-10-CM | POA: Diagnosis not present

## 2023-07-27 DIAGNOSIS — I447 Left bundle-branch block, unspecified: Secondary | ICD-10-CM | POA: Diagnosis not present

## 2023-07-27 DIAGNOSIS — F319 Bipolar disorder, unspecified: Secondary | ICD-10-CM | POA: Diagnosis not present

## 2023-07-27 DIAGNOSIS — M353 Polymyalgia rheumatica: Secondary | ICD-10-CM | POA: Diagnosis not present

## 2023-07-27 DIAGNOSIS — I340 Nonrheumatic mitral (valve) insufficiency: Secondary | ICD-10-CM | POA: Diagnosis not present

## 2023-07-27 MED ORDER — ONDANSETRON 4 MG PO TBDP
4.0000 mg | ORAL_TABLET | Freq: Once | ORAL | Status: AC
  Administered 2023-07-27: 4 mg via ORAL

## 2023-07-27 MED ORDER — DICLOFENAC SODIUM 1 % EX GEL: 2.0000 g | Freq: Four times a day (QID) | CUTANEOUS | 0 refills | Status: AC

## 2023-07-27 MED ORDER — OXYCODONE-ACETAMINOPHEN 5-325 MG PO TABS
1.0000 | ORAL_TABLET | ORAL | Status: AC | PRN
  Administered 2023-07-27 (×2): 1 via ORAL
  Filled 2023-07-27 (×2): qty 1

## 2023-07-27 MED ORDER — ONDANSETRON 4 MG PO TBDP
ORAL_TABLET | ORAL | Status: AC
  Filled 2023-07-27: qty 1

## 2023-07-27 MED ORDER — LIDOCAINE 5 % EX PTCH
1.0000 | MEDICATED_PATCH | CUTANEOUS | Status: DC
  Administered 2023-07-27: 1 via TRANSDERMAL
  Filled 2023-07-27: qty 1

## 2023-07-27 MED ORDER — OXYCODONE-ACETAMINOPHEN 5-325 MG PO TABS: 1.0000 | ORAL_TABLET | Freq: Four times a day (QID) | ORAL | 0 refills | Status: AC | PRN

## 2023-07-27 MED ORDER — METHOCARBAMOL 500 MG PO TABS
500.0000 mg | ORAL_TABLET | Freq: Once | ORAL | Status: AC
  Administered 2023-07-27: 500 mg via ORAL
  Filled 2023-07-27: qty 1
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
## 2023-07-27 NOTE — ED Provider Notes (Signed)
[facility] AT [facility] Provider Note   CSN: [id number] Arrival date & time: 07/27/23  1026     History  Chief Complaint  Patient presents with   Arm Injury    Odessa Bajema is a 77 y.o. female with a history of lung cancer, bipolar disorder, polymyalgia rheumatica, and CKD presents the ED today for arm pain.  Patient reports she got her right arm caught in between 2 chairs a couple days ago and has been having persistent pain in her right shoulder and upper arm that radiates down to her hand since then. Denies any additional injuries during the event. Patient reports that she takes 2 tablets of her husband's Oxycodone prescription last day without any relief of her symptoms.  She maintains full range of motion of her right upper extremity, denies weakness, loss sensation, numbness or tingling.  No additional complaints or concerns at this time.    Home Medications Prior to Admission medications   Medication Sig Start Date End Date Taking? Authorizing Provider  diclofenac Sodium (VOLTAREN) 1 % GEL Apply 2 g topically 4 (four) times daily. 07/27/23  Yes Maxwell Marion, PA-C  oxyCODONE-acetaminophen (PERCOCET/ROXICET) 5-325 MG tablet Take 1 tablet by mouth every 6 (six) hours as needed for up to 3 days for severe pain (pain score 7-10). 07/27/23 07/30/23 Yes Maxwell Marion, PA-C  acetaminophen (TYLENOL) 500 MG tablet Take 1,000 mg by mouth as needed for moderate pain.    [provider]  azelastine (ASTELIN) 0.1 % nasal spray Place 2 sprays into both nostrils 2 (two) times daily. 03/10/21   Hetty Blend, FNP  baclofen (LIORESAL) 10 MG tablet Take 10 mg by mouth daily as needed for muscle spasms. 06/03/20   [provider]  CALCIUM PO Take 6 tablets by mouth daily.    [provider]  cholecalciferol (VITAMIN D3) 25 MCG (1000 UNIT) tablet Take 1,000 Units by mouth daily.    [provider]  cycloSPORINE (RESTASIS) 0.05  % ophthalmic emulsion Place 1 drop into both eyes in the morning, at noon, in the evening, and at bedtime.    [provider]  denosumab (PROLIA) 60 MG/ML SOSY injection Pt to get at office.  Appt is 08/27/23 07/14/23   Sharlene Dory, DO  dexlansoprazole (DEXILANT) 60 MG capsule Take 1 capsule (60 mg total) by mouth daily. 07/26/23   Doree Albee, PA-C  diclofenac Sodium (VOLTAREN) 1 % GEL Apply 4 g topically 4 (four) times daily. 07/08/23   Durwin Glaze, MD  dicyclomine (BENTYL) 20 MG tablet Take 1 tablet (20 mg total) by mouth 2 (two) times daily as needed for spasms. 06/28/23   Sharlene Dory, DO  docusate sodium (COLACE) 100 MG capsule Take 1 capsule (100 mg total) by mouth every 12 (twelve) hours. 07/08/23   Durwin Glaze, MD  famotidine (PEPCID) 20 MG tablet Take 1 tablet (20 mg total) by mouth at bedtime. 07/26/23   Doree Albee, PA-C  ferrous sulfate 325 (65 FE) MG tablet Take 650 mg by mouth daily with breakfast.    [provider]  fludrocortisone (FLORINEF) 0.1 MG tablet Take 1 tablet (0.1 mg total) by mouth 2 (two) times daily. 06/28/23   Wendling, Jilda Roche, DO  GAMMAGARD 20 GM/200ML SOLN Inject 40 g into the vein every 21 ( twenty-one) days. INFUSE 40G INTRAVENOUSLY EVERY 3 WEEKS 02/27/22   [provider]  heparin lock flush 100 UNIT/ML SOLN injection Inject  500 Units into the vein every 21 ( twenty-one) days. 03/31/21   [provider]  HYDROmorphone (DILAUDID) 2 MG tablet Take 1 tablet (2 mg total) by mouth every 8 (eight) hours as needed for up to 15 doses for severe pain (pain score 7-10). Patient not taking: Reported on 07/26/2023 07/13/23   Terald Sleeper, MD  ibuprofen (ADVIL) 200 MG tablet Take 600 mg by mouth as needed for mild pain. Patient not taking: Reported on 07/26/2023    [provider]  KEVZARA 200 MG/1. SOAJ Inject 1.14 mLs into the skin every 14 (fourteen) days. 09/09/22   [provider]  levothyroxine (SYNTHROID) 75 MCG tablet TAKE ONE (1) TABLET BY MOUTH EACH DAY BEFORE BREAKFAST ON EMPTY STOMACH 07/19/23   Sharlene Dory, DO  lidocaine 4 % Place 1 patch onto the skin daily. 07/08/23   Durwin Glaze, MD  magnesium oxide (MAG-OX) 400 MG tablet Take 400 mg by mouth daily.     [provider]  midodrine (PROAMATINE) 10 MG tablet Take 1 tablet (10 mg total) by mouth 3 (three) times daily. 02/24/23   Wendling, Jilda Roche, DO  MYRBETRIQ 50 MG TB24 tablet Take 50 mg by mouth daily. 10/07/22   [provider]  naloxone [facility]) nasal spray 4 mg/0.1 mL Spray up nostril in event of opioid overdose. 06/28/23   Sharlene Dory, DO  ondansetron (ZOFRAN) 8 MG tablet TAKE ONE TABLET BY MOUTH EVERY EIGHT HOURS AS NEEDED FOR NAUSEA OR VOMITING 02/15/23   Carmelia Roller, Jilda Roche, DO  Oxycodone HCl 10 MG TABS Take 1 tablet (10 mg total) by mouth every 6 (six) hours as needed (Breakthrough Pain). Patient not taking: Reported on 07/26/2023 07/12/23   Sharlene Dory, DO  potassium chloride (KLOR-CON) 10 MEQ tablet TAKE ONE (1) TABLET BY MOUTH FOUR (4) TIMES DAILY 04/26/23   Wendling, Jilda Roche, DO  pregabalin (LYRICA) 50 MG capsule Take 1 capsule (50 mg total) by mouth 3 (three) times daily. 04/07/23   Sharlene Dory, DO  pregabalin (LYRICA) 50 MG capsule Take 1 capsule (50 mg total) by mouth 3 (three) times daily. 07/22/23   Sharlene Dory, DO  prochlorperazine (COMPAZINE) 10 MG tablet Take 1 tablet (10 mg total) by mouth 2 (two) times daily as needed for nausea or vomiting. 04/05/23   Arthor Captain, PA-C  pyridoxine (B-6) 100 MG tablet Take 100 mg by mouth daily.    Sharlene Dory, DO  rOPINIRole (REQUIP) 0.5 MG tablet Take 0.5 mg by mouth 3 (three) times daily.    Sharlene Dory, DO  sucralfate (CARAFATE) 1 GM/10ML suspension Take 10 mLs (1 g total) by mouth 4 (four) times daily as needed (AB pain,  GERD, nausea). 07/26/23   Doree Albee, PA-C  torsemide 40 MG TABS Take 20 mg by mouth 2 (two) times daily. 03/31/23   Carlos Levering, NP  triamcinolone cream (KENALOG) 0.1 % Apply 1 application  topically 2 (two) times daily as needed (rash).    [provider]  vitamin B-12 (CYANOCOBALAMIN) 1000 MCG tablet Take 1,000 mcg by mouth daily. 02/14/21   [provider]  Vitamin D, Ergocalciferol, (DRISDOL) 1.25 MG (50000 UNIT) CAPS capsule TAKE 1 CAPSULE ONCE WEEKLY 01/04/23   [name], Drema Pry, MD  ZINC OXIDE, TOPICAL, 10 % CREA Apply 1 application topically 2 (two) times daily as needed (rash). 02/14/21   Glade Lloyd, MD      Allergies    Butorphanol,  Erythromycin base, Propoxyphene, Sumatriptan, Tetracycline, Ciprofloxacin, Corticosteroids, Doxycycline, Cefixime, Isometheptene-dichloral-apap, Ketorolac, Prednisone, Promethazine, and Tape    Review of Systems   Review of Systems  Musculoskeletal:        Right arm pain  All other systems reviewed and are negative.   Physical Exam Updated Vital Signs BP 101/84   Pulse (!) 110   Temp 98.2 F (36.8 C) (Oral)   Resp 18   SpO2 96%  Physical Exam Vitals and nursing note reviewed.  Constitutional:      Appearance: Normal appearance.  HENT:     Head: Normocephalic and atraumatic.     Mouth/Throat:     Mouth: Mucous membranes are moist.  Eyes:     Conjunctiva/sclera: Conjunctivae normal.     Pupils: Pupils are equal, round, and reactive to light.  Cardiovascular:     Rate and Rhythm: Normal rate and regular rhythm.     Pulses: Normal pulses.     Heart sounds: Normal heart sounds.  Pulmonary:     Effort: Pulmonary effort is normal.     Breath sounds: Normal breath sounds.  Abdominal:     Palpations: Abdomen is soft.     Tenderness: There is no abdominal tenderness.  Skin:    General: Skin is warm and dry.     Findings: No rash.  Neurological:     General: No focal deficit present.     Mental Status: She  is alert.  Psychiatric:        Mood and Affect: Mood normal.        Behavior: Behavior normal.    ED Results / Procedures / Treatments   Labs (all labs ordered are listed, but only abnormal results are displayed) Labs Reviewed - No data to display  EKG None  Radiology DG Shoulder Right Portable  Result Date: 07/27/2023 CLINICAL DATA:  Larey Seat out of wheelchair, right upper arm pain EXAM: RIGHT SHOULDER - 1 VIEW COMPARISON:  None Available. FINDINGS: Internal rotation, external rotation, and transscapular views of the right shoulder are obtained. No evidence of acute fracture, subluxation, or dislocation. Joint spaces are relatively well preserved. Soft tissues are unremarkable. Subacute to chronic right posterolateral fourth through sixth rib fractures are noted, with moderate callus formation. Right chest wall port and spinal stimulator leads are partially visualized. IMPRESSION: 1. No acute displaced right shoulder fracture. 2. Subacute to chronic right posterolateral fourth through sixth rib fractures, with moderate callus formation. Electronically Signed   By: Sharlet Salina M.D.   On: 07/27/2023 15:23   DG Forearm Right  Result Date: 07/27/2023 CLINICAL DATA:  Fall from wheelchair with right upper arm pain EXAM: RIGHT HUMERUS - 2 VIEW; RIGHT FOREARM - 2 VIEW COMPARISON:  None Available. FINDINGS: There is no evidence of fracture or other focal bone lesions of the humerus, radius, or ulna. Partially imaged right ribs demonstrate age indeterminate fractures of the right anterolateral fourth and fifth and posterolateral fifth through seventh rib fractures. IMPRESSION: 1. No acute fracture or dislocation of the right humerus, radius, or ulna. 2. Age indeterminate fractures of the right anterolateral fourth and fifth and posterolateral fifth through seventh rib fractures. Electronically Signed   By: Agustin Cree M.D.   On: 07/27/2023 13:59   DG Humerus Right  Result Date: 07/27/2023 CLINICAL  DATA:  Fall from wheelchair with right upper arm pain EXAM: RIGHT HUMERUS - 2 VIEW; RIGHT FOREARM - 2 VIEW COMPARISON:  None Available. FINDINGS: There is no evidence of fracture or other focal  bone lesions of the humerus, radius, or ulna. Partially imaged right ribs demonstrate age indeterminate fractures of the right anterolateral fourth and fifth and posterolateral fifth through seventh rib fractures. IMPRESSION: 1. No acute fracture or dislocation of the right humerus, radius, or ulna. 2. Age indeterminate fractures of the right anterolateral fourth and fifth and posterolateral fifth through seventh rib fractures. Electronically Signed   By: Agustin Cree M.D.   On: 07/27/2023 13:59    Procedures Procedures: not indicated.   Medications Ordered in ED Medications  oxyCODONE-acetaminophen (PERCOCET/ROXICET) 5-325 MG per tablet 1 tablet (1 tablet Oral Given 07/27/23 1116)  lidocaine (LIDODERM) 5 % 1 patch (1 patch Transdermal Patch Applied 07/27/23 1326)  ondansetron (ZOFRAN-ODT) disintegrating tablet 4 mg (4 mg Oral Given 07/27/23 1116)  ondansetron (ZOFRAN-ODT) 4 MG disintegrating tablet (  Given 07/27/23 1116)  methocarbamol (ROBAXIN) tablet 500 mg (500 mg Oral Given 07/27/23 1326)    ED Course/ Medical Decision Making/ A&P                                 Medical Decision Making Amount and/or Complexity of Data Reviewed Radiology: ordered.  Risk Prescription drug management.   This patient presents to the ED for concern of arm pain, this involves an extensive number of treatment options, and is a complaint that carries with it a high risk of complications and morbidity.   Differential diagnosis includes: Fracture, dislocation, contusion, muscle spasm, radiculopathy, etc.   Comorbidities  See HPI above   Additional History  Additional history obtained from prior records.   Imaging Studies  I ordered imaging studies including right shoulder, humerus, and forearm x-rays.  I  independently visualized and interpreted imaging which showed: Chronic fractures of the ribs.  No evidence of acute fracture or dislocation of the shoulder, humerus, or forearm. I agree with the radiologist interpretation   Problem List / ED Course / Critical Interventions / Medication Management  Right arm pain x2 days I ordered medications including: Robaxin and Lidocaine patch for pain  Reevaluation of the patient after these medicines showed that the patient somewhat improved. I have reviewed the patients home medicines and have made adjustments as needed   Social Determinants of Health  Physical activity   Test / Admission - Considered  Discussed findings with patient.  All questions were answered. She is hemodynamically stable and safe for discharge home. Return precautions provided.       Final Clinical Impression(s) / ED Diagnoses Final diagnoses:  Muscle spasm    Rx / DC Orders ED Discharge Orders          Ordered    oxyCODONE-acetaminophen (PERCOCET/ROXICET) 5-325 MG tablet  Every 6 hours PRN        07/27/23 1540    diclofenac Sodium (VOLTAREN) 1 % GEL  4 times daily        07/27/23 1540              Maxwell Marion, PA-C 07/27/23 1549    Estelle June A, DO 08/01/23 2023

## 2023-07-27 NOTE — Progress Notes (Signed)
Agree with assessment and plan as outlined.  

## 2023-07-27 NOTE — Discharge Instructions (Signed)
As discussed, your imaging shows old fractures to the right ribs otherwise your imaging is unremarkable.  I have sent a prescription of Voltaren to your pharmacy, apply this to your back up to 4x a day as needed for pain. Additionally, take Percocet every 6 hours as needed for breakthrough pain.  Follow RICE therapy.  Follow-up with your primary care provider in the next 5 to 7 days for reevaluation of your symptoms.  Return to the ED if your symptoms worsen in the interim.

## 2023-07-27 NOTE — ED Notes (Signed)
Pt teaching provided on medications that may cause drowsiness. Pt instructed not to drive or operate heavy machinery while taking the prescribed medication. Pt verbalized understanding.   Pt provided discharge instructions and prescription information. Pt was given the opportunity to ask questions and questions were answered.   

## 2023-07-27 NOTE — Patient Outreach (Signed)
  Care Coordination   documentation  Visit Note   07/27/2023 Name: Aliyannah Corrie MRN: [id number] DOB: Dec 03, 1945  Lillia [place] Cournoyer is a 77 y.o. year old female who sees Carmelia Roller, Jilda Roche, DO for primary care. No patient contact was made during this encounter.  Documentation encounter-Per review of chart, patient noted to be in ED at this time for evaluation of right arm injury.  RNCM canceled scheduled call planned for today.  SDOH assessments and interventions completed:  No  Care Coordination Interventions:  No, not indicated   Follow up plan:  care guide to call to reschedule    Encounter Outcome:  Patient Visit Completed   Kathyrn Sheriff, RN, MSN, BSN, CCM Care Management Coordinator [phone]

## 2023-07-27 NOTE — ED Notes (Signed)
Pt was using restroom and exacerbated her R chest pain. Pt given PRN Norco prior to assisting pt to POV.Megan Brewer

## 2023-07-28 ENCOUNTER — Telehealth: Payer: Self-pay

## 2023-07-28 DIAGNOSIS — I447 Left bundle-branch block, unspecified: Secondary | ICD-10-CM | POA: Diagnosis not present

## 2023-07-28 DIAGNOSIS — D803 Selective deficiency of immunoglobulin G [IgG] subclasses: Secondary | ICD-10-CM | POA: Diagnosis not present

## 2023-07-28 DIAGNOSIS — M353 Polymyalgia rheumatica: Secondary | ICD-10-CM | POA: Diagnosis not present

## 2023-07-28 DIAGNOSIS — N1831 Chronic kidney disease, stage 3a: Secondary | ICD-10-CM | POA: Diagnosis not present

## 2023-07-28 DIAGNOSIS — I340 Nonrheumatic mitral (valve) insufficiency: Secondary | ICD-10-CM | POA: Diagnosis not present

## 2023-07-28 DIAGNOSIS — F319 Bipolar disorder, unspecified: Secondary | ICD-10-CM | POA: Diagnosis not present

## 2023-07-28 DIAGNOSIS — D631 Anemia in chronic kidney disease: Secondary | ICD-10-CM | POA: Diagnosis not present

## 2023-07-28 DIAGNOSIS — E039 Hypothyroidism, unspecified: Secondary | ICD-10-CM | POA: Diagnosis not present

## 2023-07-28 DIAGNOSIS — I951 Orthostatic hypotension: Secondary | ICD-10-CM | POA: Diagnosis not present

## 2023-07-28 NOTE — Transitions of Care (Post Inpatient/ED Visit) (Signed)
   07/28/2023  Name: [name] MRN: [id number] DOB: Aug 23, 1945  Today's TOC FU Call Status: Today's TOC FU Call Status:: Unsuccessful Call (1st Attempt) Unsuccessful Call (1st Attempt) Date: 07/28/23  Attempted to reach the patient regarding the most recent Inpatient/ED visit.  Follow Up Plan: Additional outreach attempts will be made to reach the patient to complete the Transitions of Care (Post Inpatient/ED visit) call.   Signature Karena Addison, LPN [facility] Nurse Health Advisor Direct Dial [phone]

## 2023-07-29 DIAGNOSIS — M62838 Other muscle spasm: Secondary | ICD-10-CM | POA: Diagnosis not present

## 2023-07-29 DIAGNOSIS — M542 Cervicalgia: Secondary | ICD-10-CM | POA: Diagnosis not present

## 2023-08-02 DIAGNOSIS — I340 Nonrheumatic mitral (valve) insufficiency: Secondary | ICD-10-CM | POA: Diagnosis not present

## 2023-08-02 DIAGNOSIS — I951 Orthostatic hypotension: Secondary | ICD-10-CM | POA: Diagnosis not present

## 2023-08-02 DIAGNOSIS — N1831 Chronic kidney disease, stage 3a: Secondary | ICD-10-CM | POA: Diagnosis not present

## 2023-08-02 DIAGNOSIS — D803 Selective deficiency of immunoglobulin G [IgG] subclasses: Secondary | ICD-10-CM | POA: Diagnosis not present

## 2023-08-02 DIAGNOSIS — I447 Left bundle-branch block, unspecified: Secondary | ICD-10-CM | POA: Diagnosis not present

## 2023-08-02 DIAGNOSIS — E039 Hypothyroidism, unspecified: Secondary | ICD-10-CM | POA: Diagnosis not present

## 2023-08-02 DIAGNOSIS — D631 Anemia in chronic kidney disease: Secondary | ICD-10-CM | POA: Diagnosis not present

## 2023-08-02 DIAGNOSIS — F319 Bipolar disorder, unspecified: Secondary | ICD-10-CM | POA: Diagnosis not present

## 2023-08-02 DIAGNOSIS — M353 Polymyalgia rheumatica: Secondary | ICD-10-CM | POA: Diagnosis not present

## 2023-08-03 ENCOUNTER — Ambulatory Visit (INDEPENDENT_AMBULATORY_CARE_PROVIDER_SITE_OTHER): Payer: Medicare PPO | Admitting: Family Medicine

## 2023-08-03 ENCOUNTER — Encounter: Payer: Self-pay | Admitting: Family Medicine

## 2023-08-03 VITALS — BP 105/82 | HR 54 | Temp 98.0°F | Resp 16 | Ht 64.0 in | Wt 141.0 lb

## 2023-08-03 DIAGNOSIS — M25511 Pain in right shoulder: Secondary | ICD-10-CM

## 2023-08-03 DIAGNOSIS — N1831 Chronic kidney disease, stage 3a: Secondary | ICD-10-CM | POA: Diagnosis not present

## 2023-08-03 DIAGNOSIS — M353 Polymyalgia rheumatica: Secondary | ICD-10-CM | POA: Diagnosis not present

## 2023-08-03 DIAGNOSIS — R0781 Pleurodynia: Secondary | ICD-10-CM

## 2023-08-03 DIAGNOSIS — I951 Orthostatic hypotension: Secondary | ICD-10-CM | POA: Diagnosis not present

## 2023-08-03 DIAGNOSIS — F319 Bipolar disorder, unspecified: Secondary | ICD-10-CM | POA: Diagnosis not present

## 2023-08-03 DIAGNOSIS — D803 Selective deficiency of immunoglobulin G [IgG] subclasses: Secondary | ICD-10-CM | POA: Diagnosis not present

## 2023-08-03 DIAGNOSIS — I447 Left bundle-branch block, unspecified: Secondary | ICD-10-CM | POA: Diagnosis not present

## 2023-08-03 DIAGNOSIS — D631 Anemia in chronic kidney disease: Secondary | ICD-10-CM | POA: Diagnosis not present

## 2023-08-03 DIAGNOSIS — I340 Nonrheumatic mitral (valve) insufficiency: Secondary | ICD-10-CM | POA: Diagnosis not present

## 2023-08-03 DIAGNOSIS — E039 Hypothyroidism, unspecified: Secondary | ICD-10-CM | POA: Diagnosis not present

## 2023-08-03 MED ORDER — METHYLPREDNISOLONE ACETATE 80 MG/ML IJ SUSP
80.0000 mg | Freq: Once | INTRAMUSCULAR | Status: AC
  Administered 2023-08-03: 80 mg via INTRAMUSCULAR

## 2023-08-03 MED ORDER — OXYCODONE HCL 10 MG PO TABS: 10.0000 mg | ORAL_TABLET | Freq: Four times a day (QID) | ORAL | 0 refills | Status: DC | PRN

## 2023-08-03 NOTE — Progress Notes (Signed)
Musculoskeletal Exam  Patient: Megan Brewer DOB: 09/16/1945  DOS: 08/03/2023  SUBJECTIVE:  Chief Complaint:   Chief Complaint  Patient presents with   Follow-up    Follow up ER    Megan Brewer is a 77 y.o.  female for evaluation and treatment of R shoulder pain.   Onset:  10 days ago. Arm stuck in between chairs Location: trap region Character:  aching and sharp  Progression of issue:  is unchanged Associated symptoms: decreased ROM 2/2 pain No bruising, redness, swelling Treatment: to date has been ice, acetaminophen, and opiates.   Neurovascular symptoms: no  Past Medical History:  Diagnosis Date   Anemia    Arachnoiditis    Bipolar disorder (HCC)    Cataract    removed   Chronic post-thoracotomy pain    CKD (chronic kidney disease)    GERD (gastroesophageal reflux disease)    Hypogammaglobulinemia (HCC)    Hypotension    Hypothyroid    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HCC)    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HCC)    Low blood pressure    Lung cancer (HCC) 2011, 2014   Memory loss    Migraine    Opioid abuse (HCC)    Osteoporosis    PMR (polymyalgia rheumatica) (HCC) 03/17/2022   Presence of neurostimulator    Restless legs    Sleep apnea     Objective: VITAL SIGNS: BP 105/82   Pulse (!) 54   Temp 98 F (36.7 C) (Oral)   Resp 16   Ht 5\' 4"  (1.626 m)   Wt 141 lb (64 kg)   SpO2 95%   BMI 24.20 kg/m  Constitutional: Well formed, well developed. No acute distress. Thorax & Lungs: No accessory muscle use Musculoskeletal: Right shoulder.   Normal active range of motion: no.   Normal passive range of motion: no Tenderness to palpation: Yes over the right trapezius Deformity: no Ecchymosis: no Tests positive: Neer's, Hawkins, empty can Tests negative: Spurling's, liftoff, speeds, crossover Neurologic: Normal sensory function.  Psychiatric: Normal mood. Age appropriate judgment and insight. Alert & oriented x 3.    Assessment:  Acute  pain of right shoulder - Plan: Ambulatory referral to Sports Medicine, methylPREDNISolone acetate (DEPO-MEDROL) injection 80 mg  Rib pain on right side - Plan: Oxycodone HCl 10 MG TABS, methylPREDNISolone acetate (DEPO-MEDROL) injection 80 mg  Plan: Stretches/exercises, heat, ice, Tylenol.  Depo injection today.  Refer to sports medicine.  Temporary refill of oxycodone.  Will reach out to pain management for acute treatment therapies. F/u as originally scheduled. The patient voiced understanding and agreement to the plan.   Jilda Roche [place], DO 08/03/23  1:44 PM

## 2023-08-03 NOTE — Transitions of Care (Post Inpatient/ED Visit) (Signed)
   08/03/2023  Name: Megan Brewer MRN: [id number] DOB: 1945/09/12  Today's TOC FU Call Status: Today's TOC FU Call Status:: Unsuccessful Call (1st Attempt) Unsuccessful Call (1st Attempt) Date: 07/28/23  Attempted to reach the patient regarding the most recent Inpatient/ED visit.  Follow Up Plan: No further outreach attempts will be made at this time. We have been unable to contact the patient. Patient already seen in office Signature Karena Addison, LPN [facility] Nurse Health Advisor Direct Dial [phone]

## 2023-08-03 NOTE — Patient Instructions (Signed)
Heat (pad or rice pillow in microwave) over affected area, 10-15 minutes twice daily.   Ice/cold pack over area for 10-15 min twice daily.  OK to take Tylenol 1000 mg (2 extra strength tabs) or 975 mg (3 regular strength tabs) every 6 hours as needed.  Let us know if you need anything.  EXERCISES  RANGE OF MOTION (ROM) AND STRETCHING EXERCISES These exercises may help you when beginning to rehabilitate your injury. While completing these exercises, remember:  Restoring tissue flexibility helps normal motion to return to the joints. This allows healthier, less painful movement and activity. An effective stretch should be held for at least 30 seconds. A stretch should never be painful. You should only feel a gentle lengthening or release in the stretched tissue.  ROM - Pendulum Bend at the waist so that your right / left arm falls away from your body. Support yourself with your opposite hand on a solid surface, such as a table or a countertop. Your right / left arm should be perpendicular to the ground. If it is not perpendicular, you need to lean over farther. Relax the muscles in your right / left arm and shoulder as much as possible. Gently sway your hips and trunk so they move your right / left arm without any use of your right / left shoulder muscles. Progress your movements so that your right / left arm moves side to side, then forward and backward, and finally, both clockwise and counterclockwise. Complete 10-15 repetitions in each direction. Many people use this exercise to relieve discomfort in their shoulder as well as to gain range of motion. Repeat 2 times. Complete this exercise 3 times per week.  STRETCH - Flexion, Standing Stand with good posture. With an underhand grip on your right / left hand and an overhand grip on the opposite hand, grasp a broomstick or cane so that your hands are a little more than shoulder-width apart. Keeping your right / left elbow straight and  shoulder muscles relaxed, push the stick with your opposite hand to raise your right / left arm in front of your body and then overhead. Raise your arm until you feel a stretch in your right / left shoulder, but before you have increased shoulder pain. Try to avoid shrugging your right / left shoulder as your arm rises by keeping your shoulder blade tucked down and toward your mid-back spine. Hold 30 seconds. Slowly return to the starting position. Repeat 2 times. Complete this exercise 3 times per week.  STRETCH - Internal Rotation Place your right / left hand behind your back, palm-up. Throw a towel or belt over your opposite shoulder. Grasp the towel/belt with your right / left hand. While keeping an upright posture, gently pull up on the towel/belt until you feel a stretch in the front of your right / left shoulder. Avoid shrugging your right / left shoulder as your arm rises by keeping your shoulder blade tucked down and toward your mid-back spine. Hold 30. Release the stretch by lowering your opposite hand. Repeat 2 times. Complete this exercise 3 times per week.  STRETCH - External Rotation and Abduction Stagger your stance through a doorframe. It does not matter which foot is forward. As instructed by your physician, physical therapist or athletic trainer, place your hands: And forearms above your head and on the door frame. And forearms at head-height and on the door frame. At elbow-height and on the door frame. Keeping your head and chest upright and your   stomach muscles tight to prevent over-extending your low-back, slowly shift your weight onto your front foot until you feel a stretch across your chest and/or in the front of your shoulders. Hold 30 seconds. Shift your weight to your back foot to release the stretch. Repeat 2 times. Complete this stretch 3 times per week.   STRENGTHENING EXERCISES  These exercises may help you when beginning to rehabilitate your injury. They may  resolve your symptoms with or without further involvement from your physician, physical therapist or athletic trainer. While completing these exercises, remember:  Muscles can gain both the endurance and the strength needed for everyday activities through controlled exercises. Complete these exercises as instructed by your physician, physical therapist or athletic trainer. Progress the resistance and repetitions only as guided. You may experience muscle soreness or fatigue, but the pain or discomfort you are trying to eliminate should never worsen during these exercises. If this pain does worsen, stop and make certain you are following the directions exactly. If the pain is still present after adjustments, discontinue the exercise until you can discuss the trouble with your clinician. If advised by your physician, during your recovery, avoid activity or exercises which involve actions that place your right / left hand or elbow above your head or behind your back or head. These positions stress the tissues which are trying to heal.  STRENGTH - Scapular Depression and Adduction With good posture, sit on a firm chair. Supported your arms in front of you with pillows, arm rests or a table top. Have your elbows in line with the sides of your body. Gently draw your shoulder blades down and toward your mid-back spine. Gradually increase the tension without tensing the muscles along the top of your shoulders and the back of your neck. Hold for 3 seconds. Slowly release the tension and relax your muscles completely before completing the next repetition. After you have practiced this exercise, remove the arm support and complete it in standing as well as sitting. Repeat 2 times. Complete this exercise 3 times per week.   STRENGTH - External Rotators Secure a rubber exercise band/tubing to a fixed object so that it is at the same height as your right / left elbow when you are standing or sitting on a firm  surface. Stand or sit so that the secured exercise band/tubing is at your side that is not injured. Bend your elbow 90 degrees. Place a folded towel or small pillow under your right / left arm so that your elbow is a few inches away from your side. Keeping the tension on the exercise band/tubing, pull it away from your body, as if pivoting on your elbow. Be sure to keep your body steady so that the movement is only coming from your shoulder rotating. Hold 3 seconds. Release the tension in a controlled manner as you return to the starting position. Repeat 2 times. Complete this exercise 3 times per week.   STRENGTH - Supraspinatus Stand or sit with good posture. Grasp a 2-3 lb weight or an exercise band/tubing so that your hand is "thumbs-up," like when you shake hands. Slowly lift your right / left hand from your thigh into the air, traveling about 30 degrees from straight out at your side. Lift your hand to shoulder height or as far as you can without increasing any shoulder pain. Initially, many people do not lift their hands above shoulder height. Avoid shrugging your right / left shoulder as your arm rises by keeping your   shoulder blade tucked down and toward your mid-back spine. Hold for 3 seconds. Control the descent of your hand as you slowly return to your starting position. Repeat 2 times. Complete this exercise 3 times per week.   STRENGTH - Shoulder Extensors Secure a rubber exercise band/tubing so that it is at the height of your shoulders when you are either standing or sitting on a firm arm-less chair. With a thumbs-up grip, grasp an end of the band/tubing in each hand. Straighten your elbows and lift your hands straight in front of you at shoulder height. Step back away from the secured end of band/tubing until it becomes tense. Squeezing your shoulder blades together, pull your hands down to the sides of your thighs. Do not allow your hands to go behind you. Hold for 3 seconds.  Slowly ease the tension on the band/tubing as you reverse the directions and return to the starting position. Repeat 2 times. Complete this exercise 3 times per week.   STRENGTH - Scapular Retractors Secure a rubber exercise band/tubing so that it is at the height of your shoulders when you are either standing or sitting on a firm arm-less chair. With a palm-down grip, grasp an end of the band/tubing in each hand. Straighten your elbows and lift your hands straight in front of you at shoulder height. Step back away from the secured end of band/tubing until it becomes tense. Squeezing your shoulder blades together, draw your elbows back as you bend them. Keep your upper arm lifted away from your body throughout the exercise. Hold 3 seconds. Slowly ease the tension on the band/tubing as you reverse the directions and return to the starting position. Repeat 2 times. Complete this exercise 3 times per week.  STRENGTH - Scapular Depressors Find a sturdy chair without wheels, such as a from a dining room table. Keeping your feet on the floor, lift your bottom from the seat and lock your elbows. Keeping your elbows straight, allow gravity to pull your body weight down. Your shoulders will rise toward your ears. Raise your body against gravity by drawing your shoulder blades down your back, shortening the distance between your shoulders and ears. Although your feet should always maintain contact with the floor, your feet should progressively support less body weight as you get stronger. Hold 3 seconds. In a controlled and slow manner, lower your body weight to begin the next repetition. Repeat 2 times. Complete this exercise 3 times per week.    This information is not intended to replace advice given to you by your health care provider. Make sure you discuss any questions you have with your health care provider.   Document Released: 06/17/2005 Document Revised: 08/24/2014 Document Reviewed:  11/15/2008 Elsevier Interactive Patient Education 2016 [employer]  

## 2023-08-04 ENCOUNTER — Ambulatory Visit: Payer: Medicare PPO | Admitting: Family Medicine

## 2023-08-04 ENCOUNTER — Encounter: Payer: Self-pay | Admitting: Family Medicine

## 2023-08-04 VITALS — BP 102/61 | Ht 64.0 in | Wt 135.0 lb

## 2023-08-04 DIAGNOSIS — S29012A Strain of muscle and tendon of back wall of thorax, initial encounter: Secondary | ICD-10-CM

## 2023-08-04 DIAGNOSIS — M25511 Pain in right shoulder: Secondary | ICD-10-CM | POA: Diagnosis not present

## 2023-08-04 MED ORDER — TRIAMCINOLONE ACETONIDE 40 MG/ML IJ SUSP
40.0000 mg | Freq: Once | INTRAMUSCULAR | Status: AC
  Administered 2023-08-04: 40 mg via INTRA_ARTICULAR

## 2023-08-04 NOTE — Progress Notes (Signed)
CHIEF COMPLAINT: No chief complaint on file.  _____________________________________________________________ SUBJECTIVE  HPI  Pt is a 77 y.o. female here for evaluation of Acute right shoulder pain  Evaluated yesterday in PCP clinic.  Note reviewed: -Onset 10 days ago when her arm got stuck in between chairs.  Pain is located over the trap region, described as an aching and sharp pain, has not been improving over the last 10 days.  Endorsed decreased range of motion from pending, no bruising, redness, or swelling-noted -Therapies tried include ice, Tylenol, opiates -Received Depo-Medrol injection in clinic, was advised stretching, exercises, heat, ice, Tylenol.  PCP with plans to reach out to pain management for acute treatment therapies  Was also seen in the ED for this injury 07/27/2023: -Right shoulder pain that radiated down to her hand -Taking 2 tablets of her husbands oxycodone prescription -Had full range of motion of the right upper extremity without weakness, loss sensation, numbness or tingling -Received prescription for Robaxin and lidocaine patch  X-rays of the forearm, humerus, and shoulder were obtained in the ED, independently reviewed, which showed no fractures of the forearm, -Mild AC joint degenerative changes, preserved GHJ spacing, no shoulder dislocation -Age-indeterminate fractures of anterolateral fourth, fifth right ribs, posterior lateral fifth, sixth, seventh right ribs  Since her visit yesterday: She states she could tell the injection was trying to help. It was giving her some relief. she had OT yesterday afternoon and she did a deep heat massage and that really helped. However now this morning the pain is coming back.  Primary concern today is pain, shares that family is coming to town in less than 24 hours and she wants to enjoy her time with her family Pain is globally spanning neck, shoulder, upper back, and full arm, described as a non-radiating stabbing  pain elicited with movement and very light touch.   Reported mechanism: had her elbow jammed (lateral to medial translation) and her arm shifted forward while she tried to keep her body from translating forward with her arm/shoulder, feels that a lot of her pain is coming from trying to hold her body in place from the abrupt forward translation of her arm/shoulder/upper body  Taking oxycodone, but hasn't taken medication today because she wanted to make sure her actions/exam were accurate Ice helps if she doesn't move Wet heat massage helped Lidocaine patches historically have not helped her  Has been using 'silver tube blue' tiger balm, biofreeze Undergoing PT already Robaxin didn't help  Home medications include Tylenol, baclofen, Voltaren gel, Bentyl, oxycodone, Lyrica, among others Medical history includes PMR, opioid misuse, hypothyroidism, chronic postthoracotomy pain  ------------------------------------------------------------------------------------------------------ Past Medical History:  Diagnosis Date   Anemia    Arachnoiditis    Bipolar disorder (HCC)    Cataract    removed   Chronic post-thoracotomy pain    CKD (chronic kidney disease)    GERD (gastroesophageal reflux disease)    Hypogammaglobulinemia (HCC)    Hypotension    Hypothyroid    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HCC)    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HCC)    Low blood pressure    Lung cancer (HCC) 2011, 2014   Memory loss    Migraine    Opioid abuse (HCC)    Osteoporosis    PMR (polymyalgia rheumatica) (HCC) 03/17/2022   Presence of neurostimulator    Restless legs    Sleep apnea     Past Surgical History:  Procedure Laterality Date   ABDOMINAL HYSTERECTOMY     CARPAL TUNNEL RELEASE  CATARACT EXTRACTION Bilateral 2019   CHOLECYSTECTOMY     COLONOSCOPY  2015   [facility]   CT LUNG SCREENING  2018   DENTAL SURGERY  06/17/2021   4 teeth rmoved   DG  BONE DENSITY ([facility] HX)  2018   DIAGNOSTIC  MAMMOGRAM  2019   GASTRIC BYPASS     LUNG CANCER SURGERY  02/2010   MULTIPLE TOOTH EXTRACTIONS     PORT A CATH REVISION Right 04/2021   PORT-A-CATH REMOVAL Left 02/07/2021   Procedure: REMOVAL PORT-A-CATH;  Surgeon: Rodman Pickle, MD;  Location: [facility];  Service: General;  Laterality: Left;  60 MINUTES ROOM 4   SPINAL CORD STIMULATOR IMPLANT        Outpatient Encounter Medications as of 08/04/2023  Medication Sig Note   acetaminophen (TYLENOL) 500 MG tablet Take 1,000 mg by mouth as needed for moderate pain. 12/22/2022: PRN   azelastine (ASTELIN) 0.1 % nasal spray Place 2 sprays into both nostrils 2 (two) times daily.    baclofen (LIORESAL) 10 MG tablet Take 10 mg by mouth daily as needed for muscle spasms.    CALCIUM PO Take 6 tablets by mouth daily.    cholecalciferol (VITAMIN D3) 25 MCG (1000 UNIT) tablet Take 1,000 Units by mouth daily.    cycloSPORINE (RESTASIS) 0.05 % ophthalmic emulsion Place 1 drop into both eyes in the morning, at noon, in the evening, and at bedtime.    denosumab (PROLIA) 60 MG/ML SOSY injection Pt to get at office.  Appt is 08/27/23    dexlansoprazole (DEXILANT) 60 MG capsule Take 1 capsule (60 mg total) by mouth daily.    diclofenac Sodium (VOLTAREN) 1 % GEL Apply 2 g topically 4 (four) times daily.    dicyclomine (BENTYL) 20 MG tablet Take 1 tablet (20 mg total) by mouth 2 (two) times daily as needed for spasms.    docusate sodium (COLACE) 100 MG capsule Take 1 capsule (100 mg total) by mouth every 12 (twelve) hours.    famotidine (PEPCID) 20 MG tablet Take 1 tablet (20 mg total) by mouth at bedtime.    ferrous sulfate 325 (65 FE) MG tablet Take 650 mg by mouth daily with breakfast.    fludrocortisone (FLORINEF) 0.1 MG tablet Take 1 tablet (0.1 mg total) by mouth 2 (two) times daily.    GAMMAGARD 20 GM/200ML SOLN Inject 40 g into the vein every 21 ( twenty-one) days. INFUSE 40G INTRAVENOUSLY EVERY 3 WEEKS    heparin lock flush 100 UNIT/ML SOLN injection  Inject 500 Units into the vein every 21 ( twenty-one) days.    ibuprofen (ADVIL) 200 MG tablet Take 600 mg by mouth as needed for mild pain (pain score 1-3).    KEVZARA 200 MG/1. SOAJ Inject 1.14 mLs into the skin every 14 (fourteen) days.    levothyroxine (SYNTHROID) 75 MCG tablet TAKE ONE (1) TABLET BY MOUTH EACH DAY BEFORE BREAKFAST ON EMPTY STOMACH    lidocaine 4 % Place 1 patch onto the skin daily.    magnesium oxide (MAG-OX) 400 MG tablet Take 400 mg by mouth daily.     midodrine (PROAMATINE) 10 MG tablet Take 1 tablet (10 mg total) by mouth 3 (three) times daily.    MYRBETRIQ 50 MG TB24 tablet Take 50 mg by mouth daily.    naloxone (NARCAN) nasal spray 4 mg/0.1 mL Spray up nostril in event of opioid overdose.    ondansetron (ZOFRAN) 8 MG tablet TAKE ONE TABLET BY MOUTH EVERY EIGHT  HOURS AS NEEDED FOR NAUSEA OR VOMITING    Oxycodone HCl 10 MG TABS Take 1 tablet (10 mg total) by mouth every 6 (six) hours as needed (Breakthrough Pain).    potassium chloride (KLOR-CON) 10 MEQ tablet TAKE ONE (1) TABLET BY MOUTH FOUR (4) TIMES DAILY    pregabalin (LYRICA) 50 MG capsule Take 1 capsule (50 mg total) by mouth 3 (three) times daily.    pregabalin (LYRICA) 50 MG capsule Take 1 capsule (50 mg total) by mouth 3 (three) times daily.    prochlorperazine (COMPAZINE) 10 MG tablet Take 1 tablet (10 mg total) by mouth 2 (two) times daily as needed for nausea or vomiting.    pyridoxine (B-6) 100 MG tablet Take 100 mg by mouth daily.    rOPINIRole (REQUIP) 0.5 MG tablet Take 0.5 mg by mouth 3 (three) times daily.    sucralfate (CARAFATE) 1 GM/10ML suspension Take 10 mLs (1 g total) by mouth 4 (four) times daily as needed (AB pain, GERD, nausea).    torsemide 40 MG TABS Take 20 mg by mouth 2 (two) times daily.    triamcinolone cream (KENALOG) 0.1 % Apply 1 application  topically 2 (two) times daily as needed (rash).    vitamin B-12 (CYANOCOBALAMIN) 1000 MCG tablet Take 1,000 mcg by mouth daily.     Vitamin D, Ergocalciferol, (DRISDOL) 1.25 MG (50000 UNIT) CAPS capsule TAKE 1 CAPSULE ONCE WEEKLY    ZINC OXIDE, TOPICAL, 10 % CREA Apply 1 application topically 2 (two) times daily as needed (rash). 12/22/2022: PRN   Facility-Administered Encounter Medications as of 08/04/2023  Medication   denosumab (PROLIA) injection 60 mg   methylPREDNISolone sodium succinate (SOLU-MEDROL) 1,000 mg in sodium chloride 0.9 % 50 mL IVPB   methylPREDNISolone sodium succinate (SOLU-MEDROL) 1,000 mg in sodium chloride 0.9 % 50 mL IVPB    ------------------------------------------------------------------------------------------------------  _____________________________________________________________ OBJECTIVE  PHYSICAL EXAM  Today's Vitals   08/04/23 1358  BP: 102/61  Weight: 135 lb (61.2 kg)  Height: 5\' 4"  (1.626 m)   Body mass index is 23.17 kg/m.   reviewed  General: A+Ox3, no acute distress, well-nourished, appropriate affect CV: pulses 2+ regular, nondiaphoretic, no peripheral edema, cap refill <2sec Lungs: no audible wheezing, non-labored breathing, bilateral chest rise/fall, nontachypneic Skin: warm, well-perfused, non-icteric, no susp lesions or rashes Neuro: no focal deficits. Sensation intact, muscle tone wnl, no atrophy Psych: no signs of depression or anxiety MSK:     R Shoulder:  Scapular dyskinesis present on exam, complicated by difficulty patient has in being able to flex or abduct shoulder joint Scapular Dyskinesis, of unknown chronicity FF 100, abd 85. 3/5 resisted IR/ER with diffuse pain elicited with maneuvers Significant TTP [place], clavicle, ac, coracoid, biceps groove, humerus, deltoid, subacromial space, scap spine, trap, cervical spine, paraspinal cervicothoracic musculature, scapular stabilizers, ribcage, biceps/triceps, forearm +neer, hawkins, empty can, -scarf test, +subscap liftoff, +speeds, -obriens Negative Spurling's test bilat Limited neck  ROM  _____________________________________________________________ ASSESSMENT/PLAN Diagnoses and all orders for this visit:  Acute pain of right shoulder  Muscle strain of left upper back, initial encounter  Other orders -     triamcinolone acetonide (KENALOG-40) injection 40 mg   XR films and exam findings discussed with patient. On exam today, pain does not appear to be following a distinct muscular or dermatomal distribution perhaps due to insufficient pain management as she has been holding off on her medication today for today's visit, in setting of chronic concerns that may be contributive. Was unable to tolerate diagnostic  POCUS due to severe pain with light touch sensation. Majority of today's visit spent clarifying mechanism of injury and reviewing medications/therapies tried. Multifactorial process on chronic pain concerns, history and exam suggestive of muscular injury/strain of the upper back/triceps/biceps/forearm/lateral torso and rotator cuff strain. Advise continuing with heat therapy, take home medications as prescribed. Will trial subacromial bursa injection L shoulder.   Subacromial Bursa Injection Site: right  After PARQ discussed and verbal consent given, the site was cleaned with alcohol. Steroid injection was performed using sterile technique with 3mL 1% lidocaine, 1mL 40mg /mL kenalog and 25G 1.5in needle. This was well tolerated and resulted in some relief. Dressing placed and post injection instructions were given including a discussion of likely return of pain today after the anesthetic wears off (with the possibility of worsened pain) until the steroid starts to work in 1-3 days. Call or return to clinic prn if these symptoms worsen or fail to improve as anticipated.  Plan to return in 2-3 weeks for re-evaluation. All questions answered. Return precautions discussed. Patient verbalized understanding and is in agreement with plan   Electronically signed by: Burna Forts, MD 08/04/2023 12:20 PM

## 2023-08-05 ENCOUNTER — Ambulatory Visit: Payer: Medicare PPO

## 2023-08-05 VITALS — BP 107/68 | HR 84 | Temp 97.9°F | Resp 16 | Ht 64.0 in

## 2023-08-05 DIAGNOSIS — M2550 Pain in unspecified joint: Secondary | ICD-10-CM | POA: Diagnosis not present

## 2023-08-05 DIAGNOSIS — M353 Polymyalgia rheumatica: Secondary | ICD-10-CM

## 2023-08-05 MED ORDER — SODIUM CHLORIDE 0.9 % IV SOLN
1000.0000 mg | Freq: Once | INTRAVENOUS | Status: AC
  Administered 2023-08-05: 1000 mg via INTRAVENOUS
  Filled 2023-08-05: qty 16

## 2023-08-05 MED ORDER — HEPARIN SOD (PORK) LOCK FLUSH 100 UNIT/ML IV SOLN
500.0000 [IU] | Freq: Once | INTRAVENOUS | Status: AC | PRN
Start: 2023-08-05 — End: 2023-08-05
  Administered 2023-08-05: 500 [IU]

## 2023-08-05 NOTE — Progress Notes (Signed)
Diagnosis: Polymyalgia rheumatica   Provider:  Chilton Greathouse MD  Procedure: IV Infusion  IV Type: Port a Cath, IV Location: R Chest  Solumedrol (Methylprednisolone), Dose: 1000 mg  Infusion Start Time: 1300  Infusion Stop Time: 1402  Post Infusion IV Care: Port a Cath Deaccessed/Flushed  Discharge: Condition: Good, Destination: Home . AVS Provided  Performed by:  Wyvonne Lenz, RN

## 2023-08-06 ENCOUNTER — Telehealth: Payer: Self-pay | Admitting: *Deleted

## 2023-08-06 NOTE — Progress Notes (Unsigned)
Complex Care Management Care Guide Note  08/06/2023 Name: Megan Brewer MRN: [id number] DOB: [birth year]  Megan Brewer is a 77 y.o. year old female who is a primary care patient of Sharlene Dory, DO and is actively engaged with the care management team. I reached out to [employer] by phone today to assist with re-scheduling  with the RN Case Manager.  Follow up plan: Unsuccessful telephone outreach attempt made. A HIPAA compliant phone message was left for the patient providing contact information and requesting a return call.  Burman Nieves, CCMA Care Coordination Care Guide Direct Dial: [phone]

## 2023-08-08 DIAGNOSIS — M542 Cervicalgia: Secondary | ICD-10-CM | POA: Diagnosis not present

## 2023-08-08 DIAGNOSIS — M62838 Other muscle spasm: Secondary | ICD-10-CM | POA: Diagnosis not present

## 2023-08-09 ENCOUNTER — Other Ambulatory Visit: Payer: Self-pay | Admitting: Family Medicine

## 2023-08-09 ENCOUNTER — Telehealth: Payer: Self-pay

## 2023-08-09 DIAGNOSIS — I959 Hypotension, unspecified: Secondary | ICD-10-CM

## 2023-08-09 NOTE — Telephone Encounter (Signed)
Appt scheduled w/ PCP tomorrow.  

## 2023-08-09 NOTE — Telephone Encounter (Signed)
Chief Complaint CHEST PAIN - pain, pressure, heaviness or tightness Reason for Call Symptomatic / Request for Health Information Initial Comment Caller states his wife tested positive for Covid. She is experiencing chest pain, congestion, and headache. Translation No Nurse Assessment Nurse: Tennis Ship, RN, Clarisse Gouge Date/Time Lamount Cohen Time): 08/08/2023 2:39:23 PM Confirm and document reason for call. If symptomatic, describe symptoms. ---Caller states wife tested positive for covid today, having chest pain, cough, congestion and headache since this am. Does the patient have any new or worsening symptoms? ---Yes Will a triage be completed? ---Yes Related visit to physician within the last 2 weeks? ---No Does the PT have any chronic conditions? (i.e. diabetes, asthma, this includes High risk factors for pregnancy, etc.) ---Yes List chronic conditions. ---PMR-steroid injections, immune issue-IVIG, right lung lower lobes removed- years ago, cancer. Is this a behavioral health or substance abuse call? ---No Guidelines Guideline Title Affirmed Question Affirmed Notes Nurse Date/Time (Eastern Time) COVID-19 - Diagnosed or Suspected [1] HIGH RISK patient (e.g., weak immune system, age > 64 years, obesity with BMI 30 or higher, pregnant, chronic lung disease) Tennis Ship, RNClarisse Gouge 08/08/2023 2:42:06 PM PLEASE NOTE: All timestamps contained within this report are represented as [place] Standard Time. CONFIDENTIALTY NOTICE: This fax transmission is intended only for the addressee. It contains information that is legally privileged, confidential or otherwise protected from use or disclosure. If you are not the intended recipient, you are strictly prohibited from reviewing, disclosing, copying using or disseminating any of this information or taking any action in reliance on or regarding this information. If you have received this fax in error, please notify us immediately by telephone so that  we can arrange for its return to [place]. Phone: [phone], Toll-Free: [phone], Fax: [phone] Page: 2 of 2 Call Id: [id number] Guidelines Guideline Title Affirmed Question Affirmed Notes Nurse Date/Time Lamount Cohen Time) AND [2] COVID symptoms (e.g., cough, fever) (Exceptions: Already seen by PCP and no new or worsening symptoms.) Disp. Time Lamount Cohen Time) Disposition Final User 08/08/2023 2:37:42 PM Send to Urgent Queue Domenic Moras 08/08/2023 2:53:21 PM Call PCP within 24 Hours Yes Tennis Ship RN, Clarisse Gouge Final Disposition 08/08/2023 2:53:21 PM Call PCP within 24 Hours Yes Tennis Ship, RN, Clarisse Gouge Caller Disagree/Comply Comply Caller Understands Yes PreDisposition Call Doctor Care Advice Given Per Guideline CALL PCP WITHIN 24 HOURS: * You need to discuss this with your doctor (or NP/PA) within the next 24 hours. * IF OFFICE WILL BE OPEN: Call the office when it opens tomorrow morning. ALTERNATE DISPOSITION - TELEMEDICINE WITHIN 24 HOURS: CALL BACK IF: * You become worse CARE ADVICE given per COVID-19 - DIAGNOSED OR SUSPECTED (Adult) guideline. Comments User: Shanon Rosser, RN Date/Time Lamount Cohen Time): 08/08/2023 2:42:45 PM Caller states wife recently fell and hurt left arm/shoulder and back. User: Shanon Rosser, RN Date/Time Lamount Cohen Time): 08/08/2023 2:45:28 PM chest pain only when coughing User: Shanon Rosser, RN Date/Time Lamount Cohen Time): 08/08/2023 2:54:13 PM No telehealth visits available today, caller to check mychart to see about scheduling visit or may go to UC or will call office in am. Referrals GO TO FACILITY UNDECIDED

## 2023-08-10 ENCOUNTER — Telehealth (INDEPENDENT_AMBULATORY_CARE_PROVIDER_SITE_OTHER): Payer: Medicare PPO | Admitting: Family Medicine

## 2023-08-10 ENCOUNTER — Encounter: Payer: Self-pay | Admitting: Family Medicine

## 2023-08-10 DIAGNOSIS — U071 COVID-19: Secondary | ICD-10-CM | POA: Diagnosis not present

## 2023-08-10 MED ORDER — NIRMATRELVIR/RITONAVIR (PAXLOVID) TABLET (RENAL DOSING): 2.0000 | ORAL_TABLET | Freq: Two times a day (BID) | ORAL | 0 refills | Status: AC

## 2023-08-10 MED ORDER — BENZONATATE 200 MG PO CAPS: 200.0000 mg | ORAL_CAPSULE | Freq: Two times a day (BID) | ORAL | 0 refills | Status: DC | PRN

## 2023-08-10 NOTE — Progress Notes (Signed)
Chief Complaint  Patient presents with   Covid Positive    Covid Positive     Megan Brewer here for URI complaints. We are interacting via web portal for an electronic face-to-face visit. I verified patient's ID using 2 identifiers. Patient agreed to proceed with visit via this method. Patient is at home, I am at office. Patient, her spouse Theodoro Grist, and I are present for visit.   Duration: 2 days  Associated symptoms: subjective fever, sinus congestion, sinus pain, rhinorrhea, itchy watery eyes, sore throat, myalgia, and coughing, nausea, fatigue Denies: ear pain, ear drainage, wheezing, shortness of breath, and vomiting, diarrhea Treatment to date: Advil, Tylenol, Mucinex, hot tea w lemon Sick contacts: friends from a Xmas gathering Tested + for covid 2 d ago.   Past Medical History:  Diagnosis Date   Anemia    Arachnoiditis    Bipolar disorder (HCC)    Cataract    removed   Chronic post-thoracotomy pain    CKD (chronic kidney disease)    GERD (gastroesophageal reflux disease)    Hypogammaglobulinemia (HCC)    Hypotension    Hypothyroid    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HCC)    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HCC)    Low blood pressure    Lung cancer (HCC) 2011, 2014   Memory loss    Migraine    Opioid abuse (HCC)    Osteoporosis    PMR (polymyalgia rheumatica) (HCC) 03/17/2022   Presence of neurostimulator    Restless legs    Sleep apnea     Objective No conversational dyspnea Age appropriate judgment and insight Nml affect and mood  COVID-19 - Plan: nirmatrelvir/ritonavir, renal dosing, (PAXLOVID) 10 x 150 MG & 10 x 100MG  TABS, benzonatate (TESSALON) 200 MG capsule  Continue to push fluids, practice good hand hygiene, cover mouth when coughing.  Paxlovid prescribed.  Cough medication as above. F/u prn. If starting to experience irreplaceable fluid loss, shaking, or shortness of breath, seek immediate care. Pt voiced understanding and agreement to the  plan.  Jilda Roche Pent[place], DO 08/10/23 12:13 PM

## 2023-08-10 NOTE — Telephone Encounter (Signed)
Pt has an appt today.  

## 2023-08-13 ENCOUNTER — Other Ambulatory Visit: Payer: Self-pay

## 2023-08-13 NOTE — Progress Notes (Signed)
Specialty Pharmacy Refill Coordination Note  Megan Brewer is a 77 y.o. female contacted today regarding refills of specialty medication(s) Denosumab (PROLIA)   Patient requested Courier to Provider Office   Delivery date: 08/16/23   Verified address: [facility]- [address]   Medication will be filled on 12.27.24.

## 2023-08-16 NOTE — Telephone Encounter (Signed)
 Prolia received and  placed in clinic refrigerator.

## 2023-08-19 ENCOUNTER — Ambulatory Visit: Payer: Medicare PPO | Admitting: Family Medicine

## 2023-08-23 ENCOUNTER — Encounter: Payer: Self-pay | Admitting: Family Medicine

## 2023-08-23 ENCOUNTER — Telehealth (INDEPENDENT_AMBULATORY_CARE_PROVIDER_SITE_OTHER): Payer: Medicare PPO | Admitting: Family Medicine

## 2023-08-23 DIAGNOSIS — U071 COVID-19: Secondary | ICD-10-CM | POA: Diagnosis not present

## 2023-08-23 MED ORDER — DEXAMETHASONE 6 MG PO TABS: 6.0000 mg | ORAL_TABLET | Freq: Every day | ORAL | 0 refills | Status: AC

## 2023-08-23 MED ORDER — PROMETHAZINE HCL 6.25 MG/5ML PO SOLN: 6.2500 mg | Freq: Four times a day (QID) | ORAL | 0 refills | Status: DC | PRN

## 2023-08-23 NOTE — Progress Notes (Signed)
 Complex Care Management Care Guide Note  08/23/2023 Name: Megan Brewer MRN: [id number] DOB: [birth year]  Megan Brewer is a 78 y.o. year old female who is a primary care patient of [name], DO and is actively engaged with the care management team. I reached out to [employer] by phone today to assist with re-scheduling  with the RN Case Manager.  Follow up plan: Telephone appointment with complex care management team member scheduled for:  09/09/2023  Thedford Franks, [facility] Care Coordination Care Guide Direct Dial: [phone]

## 2023-08-23 NOTE — Progress Notes (Signed)
 Chief Complaint  Patient presents with   Covid Positive    Megan Brewer here for URI complaints. We are interacting via web portal for an electronic face-to-face visit. I verified patient's ID using 2 identifiers. Patient agreed to proceed with visit via this method. Patient is at home, I am at office. Patient, her spouse, and I are present for visit.   Duration: 2 weeks; had been feeling better on 1/3 but then started to worsen again.  Associated symptoms: sinus pain, rhinorrhea, myalgia, and coughing, dizziness, fatigue, loose stools, nausea Denies: sinus congestion, sinus pain, itchy watery eyes, ear pain, ear drainage, sore throat, wheezing, shortness of breath, and fevers, vomiting Treatment to date: Paxlovid , Tessalon  Perles, OTC sinus meds, Tylenol , Ibuprofen  Sick contacts: Yes; had some friends who had COVID a few days prior to Xmas eve  Past Medical History:  Diagnosis Date   Anemia    Arachnoiditis    Bipolar disorder (HCC)    Cataract    removed   Chronic post-thoracotomy pain    CKD (chronic kidney disease)    GERD (gastroesophageal reflux disease)    Hypogammaglobulinemia (HCC)    Hypotension    Hypothyroid    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HCC)    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HCC)    Low blood pressure    Lung cancer (HCC) 2011, 2014   Memory loss    Migraine    Opioid abuse (HCC)    Osteoporosis    PMR (polymyalgia rheumatica) (HCC) 03/17/2022   Presence of neurostimulator    Restless legs    Sleep apnea     Objective No conversational dyspnea Age appropriate judgment and insight Nml affect and mood  COVID-19 - Plan: dexamethasone  (DECADRON ) 6 MG tablet, promethazine  (PHENERGAN ) 6.25 MG/5ML solution  Worsening. PO phenergan  she does OK with. Will use to coat throat for cough and for N/V. 10 d of Dex 6 mg/d as above. Continue to push fluids, practice good hand hygiene, cover mouth when coughing. F/u prn. If starting to experience fevers,  shaking, or shortness of breath, seek immediate care. Pt voiced understanding and agreement to the plan.  Mabel Mt [place], DO 08/23/23 2:22 PM

## 2023-08-23 NOTE — Telephone Encounter (Signed)
 Appt scheduled

## 2023-08-24 ENCOUNTER — Telehealth: Payer: Self-pay | Admitting: Pharmacy Technician

## 2023-08-24 ENCOUNTER — Other Ambulatory Visit: Payer: Self-pay | Admitting: Family Medicine

## 2023-08-24 NOTE — Telephone Encounter (Signed)
 Auth Submission: NO AUTH NEEDED Site of care: Site of care: [facility] Payer: [employer] MEDICARE Medication & CPT/J Code(s) submitted:  SOLU MEDROL  Route of submission (phone, fax, portal):  Phone # Fax # Auth type: Buy/Bill PB Units/visits requested: 1GM Q42 DAYS Reference number:  Approval from: 08/24/23 to 07/23/24

## 2023-08-27 ENCOUNTER — Ambulatory Visit: Payer: Medicare PPO | Admitting: Family Medicine

## 2023-08-29 DIAGNOSIS — M542 Cervicalgia: Secondary | ICD-10-CM | POA: Diagnosis not present

## 2023-08-29 DIAGNOSIS — M62838 Other muscle spasm: Secondary | ICD-10-CM | POA: Diagnosis not present

## 2023-08-31 ENCOUNTER — Encounter: Payer: Self-pay | Admitting: Physical Medicine and Rehabilitation

## 2023-08-31 ENCOUNTER — Encounter: Payer: Medicare PPO | Attending: Physical Medicine and Rehabilitation | Admitting: Physical Medicine and Rehabilitation

## 2023-08-31 ENCOUNTER — Telehealth: Payer: Self-pay | Admitting: *Deleted

## 2023-08-31 VITALS — BP 97/66 | HR 104 | Ht 64.0 in | Wt 143.2 lb

## 2023-08-31 DIAGNOSIS — R52 Pain, unspecified: Secondary | ICD-10-CM | POA: Insufficient documentation

## 2023-08-31 DIAGNOSIS — I959 Hypotension, unspecified: Secondary | ICD-10-CM | POA: Insufficient documentation

## 2023-08-31 DIAGNOSIS — R5383 Other fatigue: Secondary | ICD-10-CM | POA: Diagnosis not present

## 2023-08-31 MED ORDER — METHYLPHENIDATE HCL 5 MG PO TABS: 5.0000 mg | ORAL_TABLET | Freq: Every day | ORAL | 0 refills | Status: DC

## 2023-08-31 NOTE — Telephone Encounter (Signed)
 Called to reschedule appointment from 1/10. Husband stated that they will call back to reschedule.

## 2023-08-31 NOTE — Progress Notes (Addendum)
 Subjective:    Patient ID: Megan Brewer, female    DOB: [birth year], 78 y.o.   MRN: [id number]  HPI 1) Diffuse pain, has been attributed to polymyalgia rheumatica Megan Brewer is a 78 year old woman who presents to establish care for diffuse pain.  -cold makes her pain worse -steroid injection in shoulder from sports medicine really helped -it is present in her wrists, clavicles, left hip -the prednisone  helps to control the pain but it only lasts 6 weeks and by the times she could go for the second infusion it was bad.  -she will be seeing her rheumatologist in a few weeks and would be very grateful if he were able to increased the frequency to q6 weeks -has been pretty good  -The [facility] prednisone  helped a great deal for some time.  -she has f/u with rheumatology -they have tried high doses of prednisone , low does hydrocodone , low doses Tramadol , lidocaine  patches. Nothing has been helpful yet -she thinks the pain started after she spent 1 [facility] for sepsis and C diff. She left the hospital on June 1st. On June 4th she started with breathing difficulties, shaking, and some pain. It gradually got worse.  -her husband accompanies her today -steroids did not help leg pain -interested in cervical traction device -she asked her rheumatologist if she can gradually reduce the amount of steroid- they are talking about the IV steroids, she does not take any oral steroids -discussed that changes in weather exacerbate her pain  -she asks about results of food sensitivity testing  2) Shortness of breath -breathing without pain for the first time! -had been present July 2022 to July 2023 -she also feels a deflated rubber tire on her chest -she has been told her heart and lungs are fine -the steroids may have helped her to breath better -at the hospital where she was diagnosed she got a massive IV dose of steroids and this cleared up her lungs  3) Muscle spasms: -she has tried  flexeril  in the past with benefit -she recently tried robaxin  and this caused severe symptomatic hypotension requiring her to go to the ED  4) Hypotension: -fluids help -occurs suddenly -she is taking the midodrine  and florinef  -she takes midodrine  at 7am/7pm  5) Fluid retention: -she is on the flomax  for this   6) Movement disorder -was sent to neurologist at [facility]  7) family stress -husband has been limiting in walking due to need for removal of spinal mass -she has a visiting angel who has been helpful to her in the interim  8) Fatigue: -would like like to try a stimulant    Pain Inventory Average Pain  5 Pain Right Now 8 My pain is intermittent, constant, sharp, burning, stabbing, and aching  In the last 24 hours, has pain interfered with the following? General activity 10 Relation with others 10 Enjoyment of life 10 What TIME of day is your pain at its worst? morning , daytime, evening, and night Sleep (in general) Poor  Pain is worse with: unsure Pain improves with: rest Relief from Meds: little    New pt    Family History  Problem Relation Age of Onset   Hypertension Mother    GER disease Mother    Dementia Mother    Osteoporosis Mother    Arthritis Mother    Bipolar disorder Mother    Parkinson's disease Father    Congestive Heart Failure Father    Pneumonia Father  Arthritis Father    Dementia Father    Depression Father    Asthma Sister    Depression Sister    Bipolar disorder Brother    Asthma Daughter    Colon cancer Neg Hx    Esophageal cancer Neg Hx    Stomach cancer Neg Hx    Rectal cancer Neg Hx    Social History   Socioeconomic History   Marital status: Married    Spouse name: Not on file   Number of children: 2   Years of education: Not on file   Highest education level: GED or equivalent  Occupational History   Occupation: retired  Tobacco Use   Smoking status: Never   Smokeless tobacco: Never  Vaping Use    Vaping status: Never Used  Substance and Sexual Activity   Alcohol use: Not Currently   Drug use: Never   Sexual activity: Not on file  Other Topics Concern   Not on file  Social History Narrative   Diet: None      Caffeine : coffee, tea, sodas less than or 1 daily.      Married, if yes what year: Yes, 1972      Do you live in a house, apartment, assisted living, condo, trailer, ect: Two story house       Pets: None      Current/Past profession: Teach      Exercise: None         Living Will: No   DNR: No   POA/HPOA: No      Functional Status:   Do you have difficulty bathing or dressing yourself? yes   Do you have difficulty preparing food or eating? yes   Do you have difficulty managing your medications? yes   Do you have difficulty managing your finances?yes   Do you have difficulty affording your medications? No      Right Handed    Lives in a two story home    Social Drivers of Health   Financial Resource Strain: Low Risk  (12/16/2022)   [facility]   Overall Financial Resource Strain (CARDIA)    Difficulty of Paying Living Expenses: Not hard at all  Food Insecurity: No Food Insecurity (12/25/2022)   Hunger Vital Sign    Worried About Running Out of Food in the Last Year: Never true    Ran Out of Food in the Last Year: Never true  Transportation Needs: No Transportation Needs (12/25/2022)   PRAPARE - Administrator, Civil Service (Medical): No    Lack of Transportation (Non-Medical): No  Physical Activity: Inactive (03/19/2023)   Exercise Vital Sign    Days of Exercise per Week: 0 days    Minutes of Exercise per Session: 0 min  Stress: No Stress Concern Present (03/19/2023)   [employer] of Occupational Health - Occupational Stress Questionnaire    Feeling of Stress : Not at all  Social Connections: Socially Isolated (03/19/2023)   Social Connection and Isolation Panel [NHANES]     Frequency of Communication with Friends and Family: Once a week    Frequency of Social Gatherings with Friends and Family: Once a week    Attends Religious Services: Never    Database Administrator or Organizations: No    Attends Banker Meetings: Never    Marital Status: Married   Past Surgical History:  Procedure Laterality Date   ABDOMINAL HYSTERECTOMY  CARPAL TUNNEL RELEASE     CATARACT EXTRACTION Bilateral 2019   CHOLECYSTECTOMY     COLONOSCOPY  2015   [facility]   CT LUNG SCREENING  2018   DENTAL SURGERY  06/17/2021   4 teeth rmoved   DG  BONE DENSITY ([facility] HX)  2018   DIAGNOSTIC MAMMOGRAM  2019   GASTRIC BYPASS     LUNG CANCER SURGERY  02/2010   MULTIPLE TOOTH EXTRACTIONS     PORT A CATH REVISION Right 04/2021   PORT-A-CATH REMOVAL Left 02/07/2021   Procedure: REMOVAL PORT-A-CATH;  Surgeon: Stevie Herlene Righter, MD;  Location: [facility];  Service: General;  Laterality: Left;  60 MINUTES ROOM 4   SPINAL CORD STIMULATOR IMPLANT     Past Medical History:  Diagnosis Date   Anemia    Arachnoiditis    Bipolar disorder (HCC)    Cataract    removed   Chronic post-thoracotomy pain    CKD (chronic kidney disease)    GERD (gastroesophageal reflux disease)    Hypogammaglobulinemia (HCC)    Hypotension    Hypothyroid    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HCC)    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HCC)    Low blood pressure    Lung cancer (HCC) 2011, 2014   Memory loss    Migraine    Opioid abuse (HCC)    Osteoporosis    PMR (polymyalgia rheumatica) (HCC) 03/17/2022   Presence of neurostimulator    Restless legs    Sleep apnea    BP 97/66   Pulse (!) 104   Ht 5' 4 (1.626 m)   Wt 143 lb 3.2 oz (65 kg)   SpO2 98%   BMI 24.58 kg/m   Opioid Risk Score:   Fall Risk Score:  `1  Depression screen PHQ 2/9     08/31/2023    1:23 PM 06/01/2023    1:23 PM 03/19/2023    9:48 AM 02/22/2023    2:24 PM 10/27/2022   11:10 AM 06/11/2022   10:09 AM 09/30/2021   11:02 AM   Depression screen PHQ 2/9  Decreased Interest 3 0 0 1 1 3  0  Down, Depressed, Hopeless 3 0 0 1 1 3  0  PHQ - 2 Score 6 0 0 2 2 6  0  Altered sleeping      3   Tired, decreased energy      3   Change in appetite      2   Feeling bad or failure about yourself       3   Trouble concentrating      0   Moving slowly or fidgety/restless      3   Suicidal thoughts      3   PHQ-9 Score      23   Difficult doing work/chores      Extremely dIfficult       Review of Systems  Respiratory:  Positive for shortness of breath.   Gastrointestinal:  Positive for constipation and nausea.  Musculoskeletal:  Positive for gait problem.       Spasms  Neurological:  Positive for dizziness, tremors and weakness.  Psychiatric/Behavioral:  Positive for dysphoric mood. The patient is nervous/anxious.   All other systems reviewed and are negative.      Objective:   Physical Exam Gen: no distress, normal appearing HEENT: oral mucosa pink and moist, NCAT Cardio: Reg rate Chest: normal effort, normal rate of breathing Abd: soft, non-distended Ext: no edema Psych: pleasant, normal  affect Skin: intact Neuro: Alert and oriented x3 MSK: in wheelchair Assessment & Plan:   1) Chronic Pain Syndrome  with diffuse pain secondary to Polymylagia rheumatica -Discussed current symptoms of pain and history of pain.  -discussed improvement with steroid injection from sports medicine -discussed that steroids have been helpful but she is discussing with her rheumatologist about tapering these -Discussed benefits of exercise in reducing pain. -discussed results of food sensitivity testing, discussed that highest response was to cheese mold>pork>milk and recommended 3 months avoidance of these foods to see if symptoms improve -discussed that she was not breast fed as a child, she was born via a vaginal delivery -discussed that she was given a lot of antibiotics as a child.  -discussed that the only thing that has been  helpful was IV steroids. Continue, recommended discussing with rheumatology increasing frequency to q6 weeks.  -discontinue cymbalta  20mg  daily.  [facility], discussed that biking will help to increase her leg muscle strength and to minimize the risk of falls.  -discussed that steroids are helping -continue ergocalciferol  50,000U  -will send message to her care team (Dr. Frann PCP, Dr. Lavona cardiologist, Dr. Redell Ness rheumatologist) to discuss doing IV steroid treatment.  -will call her rheumatologist today to discuss Kevzara and Plaquenil.  -discussed her follow-up with rheumatology, who placed her on low dose prednisone  5mg  BID.  -Discussed following foods that may reduce pain: 1) Ginger (especially studied for arthritis)- reduce leukotriene production to decrease inflammation 2) Blueberries- high in phytonutrients that decrease inflammation 3) Salmon- marine omega-3s reduce joint swelling and pain 4) Pumpkin seeds- reduce inflammation 5) dark chocolate- reduces inflammation 6) turmeric- reduces inflammation 7) tart cherries - reduce pain and stiffness 8) extra virgin olive oil - its compound olecanthal helps to block prostaglandins  9) chili peppers- can be eaten or applied topically via capsaicin 10) mint- helpful for headache, muscle aches, joint pain, and itching 11) garlic- reduces inflammation  Link to further information on diet for chronic pain: [email or web address]    2) Shortness of breath secondary to lung cancer -discussed that it started one year ago -discussed that cardio pulmonary workup has been normal -discussed that her IV steroids were very helpful.  -ordered pulmonary rehab  3) Muscle spasms: -prescribed robaxin   4) Cervicalgia: -.Prescribed Zynex Nexwave, cervical traction device -discussed trigger point injections   5) Movement  disorder: -referred to [facility] Movement Disorders clinic -discussed that she cannot do MRIs.   6) Muscle spasms: -fled/c flexeril  -discussed her negative response to robaxin   7) Hypotension: -increase midodrine  to 10mg  TID, continue this dose -continue fludrocortisone  -discussed abdominal binder and teds -discussed that steroids can help -discussed that flomax  can cause this, switch to night -encouraged adequate hydration  8) CKD: Cr monitored and Cr is stable.   9) Iron deficiency: -discussed dietary sources of iron such as red meat, green leafy vegetables, chickpeas  10) Impaired balance: -discussed her plan to start PT -discussed that she is walking more than she was, discussed that she walked to the kitchen to get water -discussed that she has a visiting angel every day- discussed within an hour of her showing up she felt like they were sisters and they have a connection with each. Discussed that she goes to the grocery store for them  11) Movement disorder: -discussed that she is beng followed by neurology   12) IBS Discussed results of food sensitivity testing, discussed that highest response was to cheese  mold>pork>milk and recommended 3 months avoidance of these foods to see if symptoms improve  13) Fatigue: -ritalin  prescribed -continue vitamin D 

## 2023-09-01 ENCOUNTER — Telehealth: Payer: Self-pay | Admitting: Physician Assistant

## 2023-09-01 NOTE — Telephone Encounter (Signed)
 Left message for pt to call back

## 2023-09-01 NOTE — Telephone Encounter (Signed)
 Patient requesting to speak with a nurse in regards to various symptoms she is having as well as medication. Please advise.   Thank you

## 2023-09-03 NOTE — Telephone Encounter (Signed)
Patient returned call, please advise.   [phone]

## 2023-09-06 ENCOUNTER — Ambulatory Visit: Payer: Medicare PPO | Admitting: Family Medicine

## 2023-09-06 ENCOUNTER — Encounter: Payer: Self-pay | Admitting: Family Medicine

## 2023-09-06 VITALS — BP 115/72 | HR 90 | Temp 98.0°F | Ht 64.0 in

## 2023-09-06 DIAGNOSIS — I951 Orthostatic hypotension: Secondary | ICD-10-CM | POA: Diagnosis not present

## 2023-09-06 DIAGNOSIS — J0100 Acute maxillary sinusitis, unspecified: Secondary | ICD-10-CM

## 2023-09-06 DIAGNOSIS — M810 Age-related osteoporosis without current pathological fracture: Secondary | ICD-10-CM | POA: Diagnosis not present

## 2023-09-06 DIAGNOSIS — E039 Hypothyroidism, unspecified: Secondary | ICD-10-CM | POA: Diagnosis not present

## 2023-09-06 DIAGNOSIS — G2581 Restless legs syndrome: Secondary | ICD-10-CM

## 2023-09-06 MED ORDER — AMOXICILLIN-POT CLAVULANATE 875-125 MG PO TABS: 1.0000 | ORAL_TABLET | Freq: Two times a day (BID) | ORAL | 0 refills | Status: AC

## 2023-09-06 MED ORDER — DENOSUMAB 60 MG/ML ~~LOC~~ SOSY
60.0000 mg | PREFILLED_SYRINGE | Freq: Once | SUBCUTANEOUS | Status: AC
  Administered 2023-09-06: 60 mg via SUBCUTANEOUS

## 2023-09-06 NOTE — Patient Instructions (Signed)
Give [place] 2-3 business days to get the results of your labs back.   Continue to push fluids, practice good hand hygiene, and cover your mouth if you cough.  If you start having fevers, shaking or shortness of breath, seek immediate care.  Let us know if you need anything.

## 2023-09-06 NOTE — Progress Notes (Signed)
Chief Complaint  Patient presents with   Medication Management    Patient states she has the strength- but feet and hands do what they want "go everywhere" Patient did have a fall- few weeks back.    Subjective: Patient is a 78 y.o. female here for follow-up.  Hypothyroidism Patient presents for follow-up of hypothyroidism.  Reports compliance with medication-levothyroxine 75 mcg daily. Current symptoms include: denies new fatigue, weight changes, heat/cold intolerance, bowel/skin changes or CVS symptoms Compliant with medication without adverse effects.  RLS-taking Lyrica 50 mg 3 times daily.  Compliant, no adverse effects.  Working well overall.  Does not help with her pain unfortunately.  History of orthostasis.  She is currently taking midodrine 10 mg 3 times daily.  Compliant and no adverse effects.  Blood pressures have maintained had more acceptable levels lately.  Over 2 weeks the patient has had pain in her sinuses, worse over her cheeks.  She has had drainage and some congestion.  No fevers.  Husband had COVID several weeks ago.  She had this as well but had improved prior to the symptoms.  Has taken various over-the-counter remedies without significant improvement.  Past Medical History:  Diagnosis Date   Anemia    Arachnoiditis    Bipolar disorder (HCC)    Cataract    removed   Chronic post-thoracotomy pain    CKD (chronic kidney disease)    GERD (gastroesophageal reflux disease)    Hypogammaglobulinemia (HCC)    Hypotension    Hypothyroid    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HCC)    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HCC)    Low blood pressure    Lung cancer (HCC) 2011, 2014   Memory loss    Migraine    Opioid abuse (HCC)    Osteoporosis    PMR (polymyalgia rheumatica) (HCC) 03/17/2022   Presence of neurostimulator    Restless legs    Sleep apnea     Objective: BP 115/72   Pulse 90   Temp 98 F (36.7 C)   Ht 5\' 4"  (1.626 m)   SpO2 99%   BMI 24.58 kg/m   General: Awake, appears stated age Heart: RRR, no LE edema Lungs: CTAB, no rales, wheezes or rhonchi. No accessory muscle use Psych: normal affect and mood  Assessment and Plan: Acquired hypothyroidism - Plan: T4, free, TSH  Restless legs syndrome  Orthostatic hypotension  Acute maxillary sinusitis, recurrence not specified - Plan: amoxicillin-clavulanate (AUGMENTIN) 875-125 MG tablet  Osteoporosis, unspecified osteoporosis type, unspecified pathological fracture presence - Plan: denosumab (PROLIA) injection 60 mg  Chronic, stable.  Check labs.  Continue levothyroxine 75 mcg daily. Chronic, stable.  Continue Lyrica 50 mg 3 times a day. Chronic, stable.  Continue midodrine 10 mg 3 times daily.  Counseled on adequate fluid intake. Given duration and worsening, will send in 7 days of Augmentin. Due for Prolia injection. Follow-up in 6 months for physical or as needed. The patient voiced understanding and agreement to the plan.  Jilda Roche [place], [place] 09/06/23  4:38 PM

## 2023-09-07 ENCOUNTER — Ambulatory Visit: Payer: Medicare PPO | Admitting: Family Medicine

## 2023-09-07 ENCOUNTER — Encounter: Payer: Self-pay | Admitting: Family Medicine

## 2023-09-07 ENCOUNTER — Other Ambulatory Visit (HOSPITAL_COMMUNITY): Payer: Self-pay

## 2023-09-07 VITALS — BP 90/60 | Ht 64.0 in

## 2023-09-07 DIAGNOSIS — M549 Dorsalgia, unspecified: Secondary | ICD-10-CM | POA: Diagnosis not present

## 2023-09-07 LAB — T4, FREE: Free T4: 1.05 ng/dL (ref 0.60–1.60)

## 2023-09-07 LAB — TSH: TSH: 0.81 u[IU]/mL (ref 0.35–5.50)

## 2023-09-07 NOTE — Patient Instructions (Signed)
We spent today discussing your films/imaging [ ]  Please call [facility] and ask if you are receiving steroids with your infusion. If so, please ask what the dosage is, and call our clinic to let us know. If you don't get steroids with this, or the dosage is quite low, we will plan for an intramuscular injection to see if this can help with your pain while we pursue more imaging

## 2023-09-07 NOTE — Progress Notes (Signed)
CHIEF COMPLAINT: No chief complaint on file.  _____________________________________________________________ SUBJECTIVE  HPI  Pt is a 78 y.o. female here for Follow-up of right shoulder pain  Onset roughly 07/25/2023 when her arm got stuck in between chairs, mechanism suspected as follows: Impact to elbow lateral to medial translation while her same arm shifted forward, resulting in an abrupt/forced shoulder flexion and the patient tensing up to hold her body in place from the forward translation of her upper body Seen 08/04/2023, was given a subacromial bursa injection, states this injection lasted her through the holiday season, however pain has been slowly returning. Does note that pain severity has improved: no longer needing to lie down on an ice pack constantly, no longer with continuous pain. Will have severe flare-ups from time to time. Locate and quality of pain is different from her chronic pain, also not in the same location as when she had been initially seen Primarily located: lower scapula in the center, pain inside/underneath and up into the shoulder itself (previously located around shoulder/UE); Feels a pins ans needles sensation under her scapula Radiating: none Numbness/tingling: none Exacerbated by: touch and moving the shoulder blade, has difficulty with opening heavy doors Therapies tried so far: lidocaine patches, voltaren gel, tylenol, advil, TENS Yesterday was seen in PCP office, now being treated for maxillary sinusitis. Had also contracted COVID around christmas time.   ============================ Recently seen by PM&R, 08/31/2023 note reviewed -Has tried Flexeril in the past with benefit, recently tried Robaxin, developed hypotension, though has trialed Robaxin prior to this without this  -Presently on midodrine and Florinef -Established with rheumatology, informed physician that the only thing that has been helpful for pain has been IV steroids, is considering  discussing with rheumatology to increase frequency to every 6 weeks -Discussions ongoing to discuss Kevzara and Plaquenil -Prescribed Synex next wave cervical traction device for cervicalgia, has discussed trigger point injections -Referral sent to [facility] neurology movement disorder, discussed that she cannot do MRIs -Increase midodrine to 10 mg 3 times daily  PMH includes right-sided thoracic wall pain (per note written 08/22/2019, [facility]), postthoracotomy, low back, and radicular leg pain, lung cancer,   - has tried trileptal, did not tolerate d/t night sweats Gabapentin causes throat swelling Oral prednisone causes tinnitus/migraines ------------------------------------------------------------------------------------------------------ Past Medical History:  Diagnosis Date   Anemia    Arachnoiditis    Bipolar disorder (HCC)    Cataract    removed   Chronic post-thoracotomy pain    CKD (chronic kidney disease)    GERD (gastroesophageal reflux disease)    Hypogammaglobulinemia (HCC)    Hypotension    Hypothyroid    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HCC)    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HCC)    Low blood pressure    Lung cancer (HCC) 2011, 2014   Memory loss    Migraine    Opioid abuse (HCC)    Osteoporosis    PMR (polymyalgia rheumatica) (HCC) 03/17/2022   Presence of neurostimulator    Restless legs    Sleep apnea     Past Surgical History:  Procedure Laterality Date   ABDOMINAL HYSTERECTOMY     CARPAL TUNNEL RELEASE     CATARACT EXTRACTION Bilateral 2019   CHOLECYSTECTOMY     COLONOSCOPY  2015   [facility]   CT LUNG SCREENING  2018   DENTAL SURGERY  06/17/2021   4 teeth rmoved   DG  BONE DENSITY ([facility] HX)  2018   DIAGNOSTIC MAMMOGRAM  2019   GASTRIC BYPASS  LUNG CANCER SURGERY  02/2010   MULTIPLE TOOTH EXTRACTIONS     PORT A CATH REVISION Right 04/2021   PORT-A-CATH REMOVAL Left 02/07/2021   Procedure: REMOVAL PORT-A-CATH;  Surgeon: Rodman Pickle, MD;  Location: [facility];  Service: General;  Laterality: Left;  60 MINUTES ROOM 4   SPINAL CORD STIMULATOR IMPLANT        Outpatient Encounter Medications as of 09/07/2023  Medication Sig Note   acetaminophen (TYLENOL) 500 MG tablet Take 1,000 mg by mouth as needed for moderate pain. 12/22/2022: PRN   amoxicillin-clavulanate (AUGMENTIN) 875-125 MG tablet Take 1 tablet by mouth 2 (two) times daily for 7 days.    baclofen (LIORESAL) 10 MG tablet Take 10 mg by mouth daily as needed for muscle spasms.    CALCIUM PO Take 6 tablets by mouth daily.    cholecalciferol (VITAMIN D3) 25 MCG (1000 UNIT) tablet Take 1,000 Units by mouth daily.    cycloSPORINE (RESTASIS) 0.05 % ophthalmic emulsion Place 1 drop into both eyes in the morning, at noon, in the evening, and at bedtime.    denosumab (PROLIA) 60 MG/ML SOSY injection Pt to get at office.  Appt is 08/27/23    dexlansoprazole (DEXILANT) 60 MG capsule Take 1 capsule (60 mg total) by mouth daily.    diclofenac Sodium (VOLTAREN) 1 % GEL Apply 2 g topically 4 (four) times daily.    famotidine (PEPCID) 20 MG tablet Take 1 tablet (20 mg total) by mouth at bedtime.    ferrous sulfate 325 (65 FE) MG tablet Take 650 mg by mouth daily with breakfast.    fludrocortisone (FLORINEF) 0.1 MG tablet Take 1 tablet (0.1 mg total) by mouth 2 (two) times daily.    GAMMAGARD 20 GM/200ML SOLN Inject 40 g into the vein every 21 ( twenty-one) days. INFUSE 40G INTRAVENOUSLY EVERY 3 WEEKS    heparin lock flush 100 UNIT/ML SOLN injection Inject 500 Units into the vein every 21 ( twenty-one) days.    ibuprofen (ADVIL) 200 MG tablet Take 600 mg by mouth as needed for mild pain (pain score 1-3).    KEVZARA 200 MG/1. SOAJ Inject 1.14 mLs into the skin every 14 (fourteen) days.    levothyroxine (SYNTHROID) 75 MCG tablet TAKE ONE (1) TABLET BY MOUTH EACH DAY BEFORE BREAKFAST ON EMPTY STOMACH    lidocaine 4 % Place 1 patch onto the skin daily.    magnesium oxide  (MAG-OX) 400 MG tablet Take 400 mg by mouth daily.     methylphenidate (RITALIN) 5 MG tablet Take 1 tablet (5 mg total) by mouth daily.    midodrine (PROAMATINE) 10 MG tablet TAKE ONE (1) TABLET BY MOUTH 3 TIMES DAILY    MYRBETRIQ 50 MG TB24 tablet Take 50 mg by mouth daily.    naloxone (NARCAN) nasal spray 4 mg/0.1 mL Spray up nostril in event of opioid overdose.    potassium chloride (KLOR-CON) 10 MEQ tablet TAKE ONE (1) TABLET BY MOUTH FOUR (4) TIMES DAILY    pregabalin (LYRICA) 50 MG capsule Take 1 capsule (50 mg total) by mouth 3 (three) times daily.    pyridoxine (B-6) 100 MG tablet Take 100 mg by mouth daily.    sucralfate (CARAFATE) 1 GM/10ML suspension Take 10 mLs (1 g total) by mouth 4 (four) times daily as needed (AB pain, GERD, nausea).    torsemide 40 MG TABS Take 20 mg by mouth 2 (two) times daily.    triamcinolone cream (KENALOG) 0.1 % Apply  1 application  topically 2 (two) times daily as needed (rash).    vitamin B-12 (CYANOCOBALAMIN) 1000 MCG tablet Take 1,000 mcg by mouth daily.    Vitamin D, Ergocalciferol, (DRISDOL) 1.25 MG (50000 UNIT) CAPS capsule TAKE 1 CAPSULE ONCE WEEKLY    ZINC OXIDE, TOPICAL, 10 % CREA Apply 1 application topically 2 (two) times daily as needed (rash). 12/22/2022: PRN   Facility-Administered Encounter Medications as of 09/07/2023  Medication   denosumab (PROLIA) injection 60 mg   methylPREDNISolone sodium succinate (SOLU-MEDROL) 1,000 mg in sodium chloride 0.9 % 50 mL IVPB   methylPREDNISolone sodium succinate (SOLU-MEDROL) 1,000 mg in sodium chloride 0.9 % 50 mL IVPB    ------------------------------------------------------------------------------------------------------  _____________________________________________________________ OBJECTIVE  PHYSICAL EXAM  Today's Vitals   09/07/23 1039  BP: 90/60  Height: 5\' 4"  (1.626 m)   Body mass index is 24.58 kg/m.   reviewed  General: A+Ox3, no acute distress, well-nourished, appropriate  affect CV: pulses 2+ regular, nondiaphoretic, no peripheral edema, cap refill <2sec Lungs: no audible wheezing, non-labored breathing, bilateral chest rise/fall, nontachypneic Skin: warm, well-perfused, non-icteric, no susp lesions or rashes Neuro:  Sensation intact, muscle tone wnl, no atrophy Psych: no signs of depression or anxiety MSK:  Anterior shoulder pain/humerus pain not present on today's exam TTP medial scapular border, soft tissue overlying scapula body No gross bony deformities appreciated on exam Visualized paradoxical soft tissue protrusion, mid-upper back, mobile with speaking and breathing, not appreciated on prior exam Scapular dyskinesis present, no winging appreciated X-rays of the forearm, humerus, and shoulder were obtained in the ED, independently reviewed, which showed no fractures of the forearm, -Mild AC joint degenerative changes, preserved GHJ spacing, no shoulder dislocation -Age-indeterminate fractures of anterolateral fourth, fifth right ribs, posterior lateral fifth, sixth, seventh right ribs  Limited POCUS showing signs of trapezius strain _____________________________________________________________ ASSESSMENT/PLAN Megan Brewer was seen today for shoulder pain.  Diagnoses and all orders for this visit:  Upper back pain on right side  Improving shoulder pain, now with more distal, medial upper back pain overlying scapular region with soft tissue findings. Will obtain CT for additional evaluation. Extensive discussion regarding pain medication, follows with pain medicine, limitations with Rx including NSAIDs (hx CKD), presently on opioids, has neurostim catheter, baclofen, lyrica, limited options for management. Is interested in IM depomedrol shot, however is unsure if she recently received IV steroids with her immunoglobulin infusion; was unable to locate details of this infusion through her EMR. Plans to call our office after she reaches out to the company that  administers her IVIG, if not steroids were administered would be interested in pursuing IM administration for short term pain management of the upper back while awaiting additional workup. All questions answered. Return precautions discussed. Patient verbalized understanding and is in agreement with plan.  Electronically signed by: Burna Forts, MD 09/07/2023 7:38 AM

## 2023-09-08 ENCOUNTER — Ambulatory Visit: Payer: Medicare PPO | Admitting: Family Medicine

## 2023-09-08 VITALS — Ht 64.0 in

## 2023-09-08 DIAGNOSIS — M898X1 Other specified disorders of bone, shoulder: Secondary | ICD-10-CM

## 2023-09-08 DIAGNOSIS — M62838 Other muscle spasm: Secondary | ICD-10-CM | POA: Diagnosis not present

## 2023-09-08 DIAGNOSIS — Z8781 Personal history of (healed) traumatic fracture: Secondary | ICD-10-CM

## 2023-09-08 DIAGNOSIS — M542 Cervicalgia: Secondary | ICD-10-CM | POA: Diagnosis not present

## 2023-09-08 MED ORDER — METHYLPREDNISOLONE ACETATE 40 MG/ML IJ SUSP
40.0000 mg | Freq: Once | INTRAMUSCULAR | Status: AC
  Administered 2023-09-08: 40 mg via INTRAMUSCULAR

## 2023-09-08 NOTE — Progress Notes (Signed)
CHIEF COMPLAINT: No chief complaint on file.  _____________________________________________________________ SUBJECTIVE  HPI  Pt is a 78 y.o. female here for brief follow-up for acute pain management for upper back No changes since yesterday's visit. Following visit, called to clinic clarifying that during IVIG she did not receive IV corticosteroids. Interested in IM depomedrol injection.  GFR 33 06/2023 ------------------------------------------------------------------------------------------------------ Past Medical History:  Diagnosis Date   Anemia    Arachnoiditis    Bipolar disorder (HCC)    Cataract    removed   Chronic post-thoracotomy pain    CKD (chronic kidney disease)    GERD (gastroesophageal reflux disease)    Hypogammaglobulinemia (HCC)    Hypotension    Hypothyroid    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HCC)    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HCC)    Low blood pressure    Lung cancer (HCC) 2011, 2014   Memory loss    Migraine    Opioid abuse (HCC)    Osteoporosis    PMR (polymyalgia rheumatica) (HCC) 03/17/2022   Presence of neurostimulator    Restless legs    Sleep apnea     Past Surgical History:  Procedure Laterality Date   ABDOMINAL HYSTERECTOMY     CARPAL TUNNEL RELEASE     CATARACT EXTRACTION Bilateral 2019   CHOLECYSTECTOMY     COLONOSCOPY  2015   [facility]   CT LUNG SCREENING  2018   DENTAL SURGERY  06/17/2021   4 teeth rmoved   DG  BONE DENSITY ([facility] HX)  2018   DIAGNOSTIC MAMMOGRAM  2019   GASTRIC BYPASS     LUNG CANCER SURGERY  02/2010   MULTIPLE TOOTH EXTRACTIONS     PORT A CATH REVISION Right 04/2021   PORT-A-CATH REMOVAL Left 02/07/2021   Procedure: REMOVAL PORT-A-CATH;  Surgeon: Rodman Pickle, MD;  Location: [facility];  Service: General;  Laterality: Left;  60 MINUTES ROOM 4   SPINAL CORD STIMULATOR IMPLANT        Outpatient Encounter Medications as of 09/08/2023  Medication Sig Note   acetaminophen (TYLENOL) 500 MG tablet Take  1,000 mg by mouth as needed for moderate pain. 12/22/2022: PRN   amoxicillin-clavulanate (AUGMENTIN) 875-125 MG tablet Take 1 tablet by mouth 2 (two) times daily for 7 days.    baclofen (LIORESAL) 10 MG tablet Take 10 mg by mouth daily as needed for muscle spasms.    CALCIUM PO Take 6 tablets by mouth daily.    cholecalciferol (VITAMIN D3) 25 MCG (1000 UNIT) tablet Take 1,000 Units by mouth daily.    cycloSPORINE (RESTASIS) 0.05 % ophthalmic emulsion Place 1 drop into both eyes in the morning, at noon, in the evening, and at bedtime.    denosumab (PROLIA) 60 MG/ML SOSY injection Pt to get at office.  Appt is 08/27/23    dexlansoprazole (DEXILANT) 60 MG capsule Take 1 capsule (60 mg total) by mouth daily.    diclofenac Sodium (VOLTAREN) 1 % GEL Apply 2 g topically 4 (four) times daily.    famotidine (PEPCID) 20 MG tablet Take 1 tablet (20 mg total) by mouth at bedtime.    ferrous sulfate 325 (65 FE) MG tablet Take 650 mg by mouth daily with breakfast.    fludrocortisone (FLORINEF) 0.1 MG tablet Take 1 tablet (0.1 mg total) by mouth 2 (two) times daily.    GAMMAGARD 20 GM/200ML SOLN Inject 40 g into the vein every 21 ( twenty-one) days. INFUSE 40G INTRAVENOUSLY EVERY 3 WEEKS    heparin lock flush 100 UNIT/ML  SOLN injection Inject 500 Units into the vein every 21 ( twenty-one) days.    ibuprofen (ADVIL) 200 MG tablet Take 600 mg by mouth as needed for mild pain (pain score 1-3).    KEVZARA 200 MG/1. SOAJ Inject 1.14 mLs into the skin every 14 (fourteen) days.    levothyroxine (SYNTHROID) 75 MCG tablet TAKE ONE (1) TABLET BY MOUTH EACH DAY BEFORE BREAKFAST ON EMPTY STOMACH    lidocaine 4 % Place 1 patch onto the skin daily.    magnesium oxide (MAG-OX) 400 MG tablet Take 400 mg by mouth daily.     methylphenidate (RITALIN) 5 MG tablet Take 1 tablet (5 mg total) by mouth daily.    midodrine (PROAMATINE) 10 MG tablet TAKE ONE (1) TABLET BY MOUTH 3 TIMES DAILY    MYRBETRIQ 50 MG TB24 tablet  Take 50 mg by mouth daily.    naloxone (NARCAN) nasal spray 4 mg/0.1 mL Spray up nostril in event of opioid overdose.    potassium chloride (KLOR-CON) 10 MEQ tablet TAKE ONE (1) TABLET BY MOUTH FOUR (4) TIMES DAILY    pregabalin (LYRICA) 50 MG capsule Take 1 capsule (50 mg total) by mouth 3 (three) times daily.    pyridoxine (B-6) 100 MG tablet Take 100 mg by mouth daily.    sucralfate (CARAFATE) 1 GM/10ML suspension Take 10 mLs (1 g total) by mouth 4 (four) times daily as needed (AB pain, GERD, nausea).    torsemide 40 MG TABS Take 20 mg by mouth 2 (two) times daily.    triamcinolone cream (KENALOG) 0.1 % Apply 1 application  topically 2 (two) times daily as needed (rash).    vitamin B-12 (CYANOCOBALAMIN) 1000 MCG tablet Take 1,000 mcg by mouth daily.    Vitamin D, Ergocalciferol, (DRISDOL) 1.25 MG (50000 UNIT) CAPS capsule TAKE 1 CAPSULE ONCE WEEKLY    ZINC OXIDE, TOPICAL, 10 % CREA Apply 1 application topically 2 (two) times daily as needed (rash). 12/22/2022: PRN   Facility-Administered Encounter Medications as of 09/08/2023  Medication   denosumab (PROLIA) injection 60 mg   methylPREDNISolone sodium succinate (SOLU-MEDROL) 1,000 mg in sodium chloride 0.9 % 50 mL IVPB   methylPREDNISolone sodium succinate (SOLU-MEDROL) 1,000 mg in sodium chloride 0.9 % 50 mL IVPB    ------------------------------------------------------------------------------------------------------  _____________________________________________________________ OBJECTIVE  PHYSICAL EXAM  There were no vitals filed for this visit. There is no height or weight on file to calculate BMI.   reviewed  General: A+Ox3, no acute distress, well-nourished, appropriate affect CV: pulses 2+ regular, nondiaphoretic, cap refill <2sec Lungs: no audible wheezing, non-labored breathing, bilateral chest rise/fall, nontachypneic  _____________________________________________________________ ASSESSMENT/PLAN Diagnoses and all orders  for this visit:  Pain of right scapula -     CT CHEST WO CONTRAST; Future -     methylPREDNISolone acetate (DEPO-MEDROL) injection 40 mg  History of rib fracture -     CT CHEST WO CONTRAST; Future -     methylPREDNISolone acetate (DEPO-MEDROL) injection 40 mg   Reviewed options for pain management, 40mg  IM depomedrol given today. Counseled on discontinuing use of NSAIDs, voltaren gel, with recent lab results. May consider flexeril. Anticipate follow-up upon completion of CT to discuss results and options for mgmt.   Electronically signed by: Burna Forts, MD 09/08/2023 11:23 AM

## 2023-09-08 NOTE — Telephone Encounter (Signed)
Left message for pt to call back again.  Spoke with pt and she states she wants to schedule an appt to discuss with Marchelle Folks some esophageal issues she could not address previously. She also wants to discuss linzess with Marchelle Folks at the visit. Reports that 1 capsule is not enough and 2 is to much. Pt scheduled to see Marchelle Folks 09/20/23 at 1:30pm. Pt aware of appt.

## 2023-09-09 ENCOUNTER — Ambulatory Visit: Payer: Self-pay

## 2023-09-09 NOTE — Patient Instructions (Signed)
Visit Information  Thank you for taking time to visit with me today. Please don't hesitate to contact me if I can be of assistance to you.   Following are the goals we discussed today:  Continue to take medications as prescribed. Continue to attend provider visits as scheduled Continue to eat healthy, lean meats, vegetables, fruits, avoid saturated and transfats Contact Provider with health questions or concerns as needed  If you are experiencing a Mental Health or Behavioral Health Crisis or need someone to talk to, please call the Suicide and Crisis Lifeline: 988 call the [place] National Suicide Prevention Lifeline: (419)591-4387 or TTY: 315-433-6292 TTY 2343950224) to talk to a trained counselor  Kathyrn Sheriff, RN, MSN, BSN, CCM [place]  [facility], Population Health Case Manager Phone: [phone]

## 2023-09-09 NOTE — Patient Outreach (Signed)
  Care Coordination   Follow Up Visit Note   09/09/2023 Name: Megan Brewer MRN: [id number] DOB: November 03, 1945  Megan Brewer is a 78 y.o. year old female who sees Carmelia Roller, Jilda Roche, DO for primary care. I spoke with  Megan Brewer by phone today.  What matters to the patients health and wellness today?  Megan Brewer is without questions or concerns today. Last office visit with primary care provider on 09/06/23-treated for acute sinusitis with antibiotics. She states she is taking as prescribed. She denies any care management needs at this time. Megan Brewer declines any additional follow up. Patient to contact Primary Care Provider if care management needs in the future.  Goals Addressed             This Visit's Progress    COMPLETED: Care Coordination Activities       Interventions Today    Flowsheet Row Most Recent Value  Chronic Disease   Chronic disease during today's visit Other, Hypertension (HTN)  [orthostasis, sinusitis]  General Interventions   General Interventions Discussed/Reviewed General Interventions Reviewed, Doctor Visits  [Evaluation of current treatment plan for health condition and patient's adherence to plan. reviewed care management program with patient again. encouraged to contact RNCM or PCP if care management needs in the furture. offered RNCM contact number.]  Doctor Visits Discussed/Reviewed PCP, Specialist  PCP/Specialist Visits Compliance with follow-up visit  [reviewed upcoming appointment with patient. confirmed patient has transportation]  Education Interventions   Education Provided Provided Education  Provided Verbal Education On When to see the doctor, Medication, Other  [advised to continue to take medications as prescribed, attend provider appointments as scheduled, contact provider with health questions or concerns as needed]  Pharmacy Interventions   Pharmacy Dicussed/Reviewed Pharmacy Topics Reviewed  [medication review completed. no  medication management needs at this time. advised to take antibiotic until finished. contact provider if any concerns about medication arise or if condition(sinus infection) does not improve.]    SDOH assessments and interventions completed:  Yes SDOH Interventions Today    Flowsheet Row Most Recent Value  SDOH Interventions   Food Insecurity Interventions Intervention Not Indicated  Housing Interventions Intervention Not Indicated  Transportation Interventions Intervention Not Indicated  Utilities Interventions Intervention Not Indicated     Care Coordination Interventions:  Yes, provided   Follow up plan: No further intervention required.   Encounter Outcome:  Patient Visit Completed   Kathyrn Sheriff, RN, MSN, BSN, CCM [place]  [facility], Population Health Case Manager Phone: [phone]

## 2023-09-14 ENCOUNTER — Telehealth: Payer: Self-pay | Admitting: Physical Medicine and Rehabilitation

## 2023-09-14 NOTE — Telephone Encounter (Signed)
Patient is asking to increase Ritalin. She is also stating instructions from pharmacy are different than Dr Carlis Abbott gave her.

## 2023-09-15 ENCOUNTER — Other Ambulatory Visit: Payer: Self-pay | Admitting: Physical Medicine and Rehabilitation

## 2023-09-15 MED ORDER — METHYLPHENIDATE HCL 10 MG PO TABS: 10.0000 mg | ORAL_TABLET | Freq: Every day | ORAL | 0 refills | Status: DC

## 2023-09-15 NOTE — Telephone Encounter (Signed)
Notified of increase.

## 2023-09-16 ENCOUNTER — Ambulatory Visit: Payer: Medicare PPO

## 2023-09-16 ENCOUNTER — Ambulatory Visit (HOSPITAL_BASED_OUTPATIENT_CLINIC_OR_DEPARTMENT_OTHER)
Admission: RE | Admit: 2023-09-16 | Discharge: 2023-09-16 | Disposition: A | Discharge status: Home / Self Care | Payer: Medicare PPO | Source: Ambulatory Visit | Attending: Family Medicine | Admitting: Family Medicine

## 2023-09-16 VITALS — BP 90/59 | HR 77 | Temp 98.2°F | Resp 12 | Ht 64.0 in | Wt 139.4 lb

## 2023-09-16 DIAGNOSIS — J929 Pleural plaque without asbestos: Secondary | ICD-10-CM | POA: Diagnosis not present

## 2023-09-16 DIAGNOSIS — M2550 Pain in unspecified joint: Secondary | ICD-10-CM

## 2023-09-16 DIAGNOSIS — I700 Atherosclerosis of aorta: Secondary | ICD-10-CM | POA: Insufficient documentation

## 2023-09-16 DIAGNOSIS — Z8781 Personal history of (healed) traumatic fracture: Secondary | ICD-10-CM | POA: Insufficient documentation

## 2023-09-16 DIAGNOSIS — M898X1 Other specified disorders of bone, shoulder: Secondary | ICD-10-CM | POA: Insufficient documentation

## 2023-09-16 DIAGNOSIS — X58XXXA Exposure to other specified factors, initial encounter: Secondary | ICD-10-CM | POA: Insufficient documentation

## 2023-09-16 DIAGNOSIS — S271XXA Traumatic hemothorax, initial encounter: Secondary | ICD-10-CM | POA: Diagnosis not present

## 2023-09-16 DIAGNOSIS — I878 Other specified disorders of veins: Secondary | ICD-10-CM | POA: Diagnosis not present

## 2023-09-16 DIAGNOSIS — Z902 Acquired absence of lung [part of]: Secondary | ICD-10-CM | POA: Diagnosis not present

## 2023-09-16 DIAGNOSIS — M353 Polymyalgia rheumatica: Secondary | ICD-10-CM

## 2023-09-16 DIAGNOSIS — S2231XA Fracture of one rib, right side, initial encounter for closed fracture: Secondary | ICD-10-CM | POA: Diagnosis not present

## 2023-09-16 MED ORDER — HEPARIN SOD (PORK) LOCK FLUSH 100 UNIT/ML IV SOLN
500.0000 [IU] | Freq: Once | INTRAVENOUS | Status: AC | PRN
  Administered 2023-09-16: 500 [IU]

## 2023-09-16 MED ORDER — SODIUM CHLORIDE 0.9 % IV SOLN
1000.0000 mg | Freq: Once | INTRAVENOUS | Status: AC
  Administered 2023-09-16: 1000 mg via INTRAVENOUS
  Filled 2023-09-16: qty 16

## 2023-09-16 NOTE — Progress Notes (Signed)
Diagnosis: Polymyalgia rheumatica  Provider:  Chilton Greathouse MD  Procedure: IV Infusion  IV Type: Port a Cath, IV Location: R Chest  Solumedrol (Methylprednisolone), Dose: 1000 mg  Infusion Start Time: 1336  Infusion Stop Time: 1439  Post Infusion IV Care: Port a Cath Deaccessed/Flushed Heparin locked   Discharge: Condition: Good, Destination: Home . AVS Provided  Performed by:  Adriana Mccallum, RN

## 2023-09-20 ENCOUNTER — Encounter: Payer: Self-pay | Admitting: Physician Assistant

## 2023-09-20 ENCOUNTER — Telehealth: Payer: Self-pay | Admitting: Pharmacy Technician

## 2023-09-20 ENCOUNTER — Ambulatory Visit: Payer: Medicare PPO | Admitting: Physician Assistant

## 2023-09-20 ENCOUNTER — Other Ambulatory Visit (HOSPITAL_COMMUNITY): Payer: Self-pay

## 2023-09-20 ENCOUNTER — Telehealth: Payer: Self-pay

## 2023-09-20 VITALS — BP 100/62 | HR 96 | Ht 64.0 in | Wt 139.4 lb

## 2023-09-20 DIAGNOSIS — D696 Thrombocytopenia, unspecified: Secondary | ICD-10-CM

## 2023-09-20 DIAGNOSIS — R413 Other amnesia: Secondary | ICD-10-CM

## 2023-09-20 DIAGNOSIS — M353 Polymyalgia rheumatica: Secondary | ICD-10-CM | POA: Diagnosis not present

## 2023-09-20 DIAGNOSIS — R251 Tremor, unspecified: Secondary | ICD-10-CM

## 2023-09-20 DIAGNOSIS — R1310 Dysphagia, unspecified: Secondary | ICD-10-CM

## 2023-09-20 DIAGNOSIS — Z86018 Personal history of other benign neoplasm: Secondary | ICD-10-CM | POA: Diagnosis not present

## 2023-09-20 DIAGNOSIS — K5904 Chronic idiopathic constipation: Secondary | ICD-10-CM

## 2023-09-20 DIAGNOSIS — R1319 Other dysphagia: Secondary | ICD-10-CM

## 2023-09-20 DIAGNOSIS — K219 Gastro-esophageal reflux disease without esophagitis: Secondary | ICD-10-CM | POA: Diagnosis not present

## 2023-09-20 MED ORDER — FAMOTIDINE 20 MG PO TABS: 20.0000 mg | ORAL_TABLET | Freq: Two times a day (BID) | ORAL | 3 refills | Status: AC

## 2023-09-20 MED ORDER — TRULANCE 3 MG PO TABS: 3.0000 mg | ORAL_TABLET | Freq: Every day | ORAL | 0 refills | Status: DC

## 2023-09-20 NOTE — Telephone Encounter (Signed)
Pharmacy Patient Advocate Encounter   Received notification from CoverMyMeds that prior authorization for Trulance 3 mg tablets is required/requested.   Insurance verification completed.   The patient is insured through [employer] .   Per test claim: PA required; PA started via CoverMyMeds. KEY [id number] . Waiting for clinical questions to populate.

## 2023-09-20 NOTE — Telephone Encounter (Signed)
Pharmacy Patient Advocate Encounter   Received notification from CoverMyMeds that prior authorization for TRULANCE 3MG  is required/requested.   Insurance verification completed.   The patient is insured through [employer] .   Per test claim: PA required; PA submitted to above mentioned insurance via CoverMyMeds Key/confirmation #/[id number] Status is pending

## 2023-09-20 NOTE — Progress Notes (Signed)
Agree with assessment as outlined. We will try to schedule her for EGD at the hospital in the next available opening but this could be several weeks.  May be reasonable to do a barium swallow in the interim to see if we can help isolate etiology.  [place] I am adding you here to this string.  Not sure when the next availability is to add EGD at the hospital.  I am there this Thursday but not sure if any room.  If we cannot do it then, can you help put her in the next available opening and coordinate a barium swallow with tablet for her?  Thanks

## 2023-09-20 NOTE — Telephone Encounter (Signed)
Pharmacy Patient Advocate Encounter  Received notification from [facility] that Prior Authorization for TRULANCE 3MG  has been APPROVED from 1.1.25 to 12.31.25. Ran test claim, Copay is $64. This test claim was processed through [facility]- copay amounts may vary at other pharmacies due to pharmacy/plan contracts, or as the patient moves through the different stages of their insurance plan.   PA #/Case ID/Reference #: [id number]

## 2023-09-20 NOTE — Patient Instructions (Addendum)
Continue dexilant and break apart Continue the pepcid but will increase to 20 mg BID, can take up to 4 pills a day as needed Continue carafate as needed- this can cause constipation and dark stools  Avoid spicy and acidic foods Avoid fatty foods Limit your intake of coffee, tea, alcohol, and carbonated drinks Work to maintain a healthy weight Keep the head of the bed elevated at least 3 inches with blocks or a wedge pillow if you are having any nighttime symptoms Stay upright for 2 hours after eating Avoid meals and snacks three to four hours before bedtime   Gastroparesis- can happen after COVID Please do small frequent meals like 4-6 meals a day.  Eat and drink liquids at separate times.  Avoid high fiber foods, cook your vegetables, avoid high fat food.  Suggest spreading protein throughout the day (greek yogurt, glucerna, soft meat, milk, eggs) Choose soft foods that you can mash with a fork When you are more symptomatic, change to pureed foods foods and liquids.  Consider reading "Living well with Gastroparesis" by Reuel Derby Gastroparesis is a condition in which food takes longer than normal to empty from the stomach. This condition is also known as delayed gastric emptying. It is usually a long-term (chronic) condition. There is no cure, but there are treatments and things that you can do at home to help relieve symptoms. Treating the underlying condition that causes gastroparesis can also help relieve symptoms What are the causes? In many cases, the cause of this condition is not known. Possible causes include: A hormone (endocrine) disorder, such as hypothyroidism or diabetes. A nervous system disease, such as Parkinson's disease or multiple sclerosis. Cancer, infection, or surgery that affects the stomach or vagus nerve. The vagus nerve runs from your chest, through your neck, and to the lower part of your brain. A connective tissue disorder, such as  scleroderma. Certain medicines. What increases the risk? You are more likely to develop this condition if: You have certain disorders or diseases. These may include: An endocrine disorder. An eating disorder. Amyloidosis. Scleroderma. Parkinson's disease. Multiple sclerosis. Cancer or infection of the stomach or the vagus nerve. You have had surgery on your stomach or vagus nerve. You take certain medicines. You are female. What are the signs or symptoms? Symptoms of this condition include: Feeling full after eating very little or a loss of appetite. Nausea, vomiting, or heartburn. Bloating of your abdomen. Inconsistent blood sugar (glucose) levels on blood tests. Unexplained weight loss. Acid from the stomach coming up into the esophagus (gastroesophageal reflux). Sudden tightening (spasm) of the stomach, which can be painful. Symptoms may come and go. Some people may not notice any symptoms. How is this diagnosed? This condition is diagnosed with tests, such as: Tests that check how long it takes food to move through the stomach and intestines. These tests include: Upper gastrointestinal (GI) series. For this test, you drink a liquid that shows up well on X-rays, and then X-rays are taken of your intestines. Gastric emptying scintigraphy. For this test, you eat food that contains a small amount of radioactive material, and then scans are taken. Wireless capsule GI monitoring system. For this test, you swallow a pill (capsule) that records information about how foods and fluid move through your stomach. Gastric manometry. For this test, a tube is passed down your throat and into your stomach to measure electrical and muscular activity. Endoscopy. For this test, a long, thin tube with a camera and light on the  end is passed down your throat and into your stomach to check for problems in your stomach lining. Ultrasound. This test uses sound waves to create images of the inside of your  body. This can help rule out gallbladder disease or pancreatitis as a cause of your symptoms. How is this treated? There is no cure for this condition, but treatment and home care may relieve symptoms. Treatment may include: Treating the underlying cause. Managing your symptoms by making changes to your diet and exercise habits. Taking medicines to control nausea and vomiting and to stimulate stomach muscles. Getting food through a feeding tube in the hospital. This may be done in severe cases. Having surgery to insert a device called a gastric electrical stimulator into your body. This device helps improve stomach emptying and control nausea and vomiting. Follow these instructions at home: Take over-the-counter and prescription medicines only as told by your health care provider. Follow instructions from your health care provider about eating or drinking restrictions. Your health care provider may recommend that you: Eat smaller meals more often. Eat low-fat foods. Eat low-fiber forms of high-fiber foods. For example, eat cooked vegetables instead of raw vegetables. Have only liquid foods instead of solid foods. Liquid foods are easier to digest. Drink enough fluid to keep your urine pale yellow. Exercise as often as told by your health care provider. Keep all follow-up visits. This is important. Contact a health care provider if you: Notice that your symptoms do not improve with treatment. Have new symptoms. Get help right away if you: Have severe pain in your abdomen that does not improve with treatment. Have nausea that is severe or does not go away. Vomit every time you drink fluids. Summary Gastroparesis is a long-term (chronic) condition in which food takes longer than normal to empty from the stomach. Symptoms include nausea, vomiting, heartburn, bloating of your abdomen, and loss of appetite. Eating smaller portions, low-fat foods, and low-fiber forms of high-fiber foods may help  you manage your symptoms. Get help right away if you have severe pain in your abdomen. This information is not intended to replace advice given to you by your health care provider. Make sure you discuss any questions you have with your health care provider. Document Revised: 12/11/2019 Document Reviewed: 12/11/2019 Elsevier Patient Education  2021 [employer].

## 2023-09-20 NOTE — Progress Notes (Signed)
09/20/2023 Megan Brewer [id number] [birth year]  Referring provider: Sharlene Dory* Primary GI doctor: Dr. Adela Lank  ASSESSMENT AND PLAN:   GERD with dysphagia, history of Roux-en-Y Has lost about 15 lbs per patient, worsening dysphagia toast/pills, worsening GERD with early satiety Last EGD 2021 mild esophagitis Worse since COVID in Dec No  melena Continue Dexilant open capsule sprinkle onto food, continue Pepcid 40 mg BID Can take Carafate as needed. Refilled all of these medications Information given to the patient. Given information about gastroparesis, potential COVID induced Will schedule EGD. I discussed risks of EGD with patient today, including risk of sedation, bleeding or perforation.  Patient provides understanding and gave verbal consent to proceed.  Chronic idiopathic constipation Unable to tolerate MiraLAX patient's had worsening tremors and imbalance in wheelchair having some worsening constipation with decreased movement.   Unable to tolerate linzess, has tried [place] in the past, will do trulance 3 mg daily  Polymyalgia rheumatica (HCC) Continue follow-up with rheumatology  Thrombocytopenia, unspecified (HCC) Noncontrast CT renal stone previous cholecystectomy mild biliary ductal dilation no liver lesion, mild pancreatic atrophy normal spleen size. Likely related to medications with polypharmacy  Memory loss, with tremor, possible PMR Follows [facility] Currently in wheelchair has some aphasia and memory issues. Will plan on repeat EGD in the hospital setting due to wheelchair, hypotension  AIN (anal intraepithelial neoplasia) anal canal May 2021 had AIN of hypertrophied anal papillae.  Patient denies any rectal bleeding, rectal pain  Saw [facility] 04/29/2020 no signs of dysplasia, suggested repeat anoscopy in 2 years, has not been pack Declines rectal exam Will refer back for evaluation Recall colon 2027 will evaluate patient's  health at that time.  Patient Care Team: Sharlene Dory, DO as PCP - General (Family Medicine) Rollene Rotunda, MD as PCP - Cardiology (Cardiology) Glendale Chard, DO as Consulting Physician (Neurology)  HISTORY OF PRESENT ILLNESS: 78 y.o. female with a past medical history of sleep apnea, restless leg syndrome, depression, lung cancer for which she underwent resection and is status post Roux-en-Y gastric bypass. She also has an IgG deficiency. She had had an episode of C. difficile colitis in May 2022 and others listed below presents for evaluation of GERD and constipation.   Last colonoscopy and EGD both done May 2021.  EGD pertinent for mild esophagitis, there was a small visible staple removed from the stomach. At colonoscopy she had 1 small adenomatous polyp and is indicated for 7-year interval follow-up. She also was found to have anal AIN in a biopsy of hypertrophied anal papillae. ( 2028) She was referred to [facility]   04/30/2022 office visit with Mike Gip PA for GERD worsened with prednisone for PMR instructed to open Dexilant capsule to help with absorption with previous Roux-en-Y. Recall colonoscopy May 2028. Referred to [facility] for previous AIN  She has been on dexilant daily, breaks apart and uses in the morning. She is on pepcid 20 mg up to 4 x a day.  She will take carafate as needed, states this depends on how much it is burning. Can have nocturnal GERD once every few days, sleeps with head and feet raised  but not helping.  Will take goody's once a month for migraines, last one this AM. Denies melena.  She has started to have some trouble with toast/large pills.  She states she had COVID after new years, she also had sinus infection with ABX, she states her stools with these medications have been soft but not  loose.  She states her appetite is okay but she has had about 10-15 lb weight loss since Nov, she gets full very quickly, some nausea, no vomiting.  She was on  linzess like suggested, one was not enough and two was not enough. She can not take miralax due to taste. No hematochezia or rectal pain.  She is in wheel chair, trouble with word finding and following with neurology. She is now on midodrine.  She  reports that she has never smoked. She has never used smokeless tobacco. She reports that she does not currently use alcohol. She reports that she does not use drugs.  RELEVANT LABS AND IMAGING:  Results          CBC    Component Value Date/Time   WBC 3.4 (L) 07/13/2023 1248   RBC 4.04 07/13/2023 1248   HGB 12.7 07/13/2023 1248   HCT 38.4 07/13/2023 1248   PLT 136 (L) 07/13/2023 1248   MCV 95.0 07/13/2023 1248   MCH 31.4 07/13/2023 1248   MCHC 33.1 07/13/2023 1248   RDW 12.8 07/13/2023 1248   LYMPHSABS 1.4 07/08/2023 1347   MONOABS 0.5 07/08/2023 1347   EOSABS 0.1 07/08/2023 1347   BASOSABS 0.1 07/08/2023 1347   Recent Labs    02/09/23 1738 02/18/23 1256 02/21/23 1906 03/24/23 1429 04/05/23 1258 04/26/23 0949 05/20/23 1334 06/26/23 1442 07/08/23 1347 07/13/23 1248  HGB 12.9 11.3* 11.8* 11.7* 13.0 12.5 13.2 13.2 12.2 12.7    CMP     Component Value Date/Time   NA 140 07/13/2023 1248   NA 140 11/20/2021 0956   K 3.4 (L) 07/13/2023 1248   CL 105 07/13/2023 1248   CO2 23 07/13/2023 1248   GLUCOSE 103 (H) 07/13/2023 1248   BUN 29 (H) 07/13/2023 1248   BUN 17 11/20/2021 0956   CREATININE 1.63 (H) 07/13/2023 1248   CREATININE 1.55 (H) 04/01/2022 0917   CALCIUM 9.1 07/13/2023 1248   PROT 6.6 07/08/2023 1347   ALBUMIN 2.8 (L) 07/08/2023 1347   AST 44 (H) 07/08/2023 1347   ALT 44 07/08/2023 1347   ALKPHOS 56 07/08/2023 1347   BILITOT 0.4 07/08/2023 1347   GFRNONAA 33 (L) 07/13/2023 1248   GFRNONAA 33 (L) 06/10/2020 1549   GFRAA 38 (L) 06/10/2020 1549      Latest Ref Rng & Units 07/08/2023    1:47 PM 06/26/2023    2:42 PM 05/20/2023    1:34 PM  Hepatic Function  Total Protein 6.5 - 8.1 g/dL 6.6  6.4  6.4    Albumin 3.5 - 5.0 g/dL 2.8  3.0  3.4   AST 15 - 41 U/L 44  58  53   ALT 0 - 44 U/L 44  66  47   Alk Phosphatase 38 - 126 U/L 56  54  55   Total Bilirubin <1.2 mg/dL 0.4  0.2  0.5       Current Medications:   Current Outpatient Medications (Endocrine & Metabolic):    denosumab (PROLIA) 60 MG/ML SOSY injection, Pt to get at office.  Appt is 08/27/23   fludrocortisone (FLORINEF) 0.1 MG tablet, Take 1 tablet (0.1 mg total) by mouth 2 (two) times daily.   levothyroxine (SYNTHROID) 75 MCG tablet, TAKE ONE (1) TABLET BY MOUTH EACH DAY BEFORE BREAKFAST ON EMPTY STOMACH  Current Facility-Administered Medications (Endocrine & Metabolic):    denosumab (PROLIA) injection 60 mg  Facility-Administered Medications Ordered in Other Visits (Endocrine & Metabolic):    methylPREDNISolone sodium  succinate (SOLU-MEDROL) 1,000 mg in sodium chloride 0.9 % 50 mL IVPB   methylPREDNISolone sodium succinate (SOLU-MEDROL) 1,000 mg in sodium chloride 0.9 % 50 mL IVPB  Current Outpatient Medications (Cardiovascular):    midodrine (PROAMATINE) 10 MG tablet, TAKE ONE (1) TABLET BY MOUTH 3 TIMES DAILY   torsemide 40 MG TABS, Take 20 mg by mouth 2 (two) times daily.     Current Outpatient Medications (Analgesics):    acetaminophen (TYLENOL) 500 MG tablet, Take 1,000 mg by mouth as needed for moderate pain.   ibuprofen (ADVIL) 200 MG tablet, Take 600 mg by mouth as needed for mild pain (pain score 1-3).   KEVZARA 200 MG/1. SOAJ, Inject 1.14 mLs into the skin every 14 (fourteen) days.   Current Outpatient Medications (Hematological):    ferrous sulfate 325 (65 FE) MG tablet, Take 650 mg by mouth daily with breakfast.   heparin lock flush 100 UNIT/ML SOLN injection, Inject 500 Units into the vein every 21 ( twenty-one) days.   vitamin B-12 (CYANOCOBALAMIN) 1000 MCG tablet, Take 1,000 mcg by mouth daily.   Current Outpatient Medications (Other):    baclofen (LIORESAL) 10 MG tablet, Take 10 mg by  mouth daily as needed for muscle spasms.   CALCIUM PO, Take 6 tablets by mouth daily.   cholecalciferol (VITAMIN D3) 25 MCG (1000 UNIT) tablet, Take 1,000 Units by mouth daily.   cycloSPORINE (RESTASIS) 0.05 % ophthalmic emulsion, Place 1 drop into both eyes in the morning, at noon, in the evening, and at bedtime.   dexlansoprazole (DEXILANT) 60 MG capsule, Take 1 capsule (60 mg total) by mouth daily.   diclofenac Sodium (VOLTAREN) 1 % GEL, Apply 2 g topically 4 (four) times daily.   dicyclomine (BENTYL) 20 MG tablet, Take 20 mg by mouth 4 (four) times daily.   GAMMAGARD 20 GM/200ML SOLN, Inject 40 g into the vein every 21 ( twenty-one) days. INFUSE 40G INTRAVENOUSLY EVERY 3 WEEKS   lidocaine 4 %, Place 1 patch onto the skin daily.   magnesium oxide (MAG-OX) 400 MG tablet, Take 400 mg by mouth daily.    methylphenidate (RITALIN) 10 MG tablet, Take 1 tablet (10 mg total) by mouth daily.   MYRBETRIQ 50 MG TB24 tablet, Take 50 mg by mouth daily.   naloxone (NARCAN) nasal spray 4 mg/0.1 mL, Spray up nostril in event of opioid overdose.   Plecanatide (TRULANCE) 3 MG TABS, Take 1 tablet (3 mg total) by mouth daily.   potassium chloride (KLOR-CON) 10 MEQ tablet, TAKE ONE (1) TABLET BY MOUTH FOUR (4) TIMES DAILY   pregabalin (LYRICA) 50 MG capsule, Take 1 capsule (50 mg total) by mouth 3 (three) times daily.   pyridoxine (B-6) 100 MG tablet, Take 100 mg by mouth daily.   sucralfate (CARAFATE) 1 GM/10ML suspension, Take 10 mLs (1 g total) by mouth 4 (four) times daily as needed (AB pain, GERD, nausea).   triamcinolone cream (KENALOG) 0.1 %, Apply 1 application  topically 2 (two) times daily as needed (rash).   Vitamin D, Ergocalciferol, (DRISDOL) 1.25 MG (50000 UNIT) CAPS capsule, TAKE 1 CAPSULE ONCE WEEKLY   ZINC OXIDE, TOPICAL, 10 % CREA, Apply 1 application topically 2 (two) times daily as needed (rash).   famotidine (PEPCID) 20 MG tablet, Take 1 tablet (20 mg total) by mouth 2 (two) times  daily.   Medical History:  Past Medical History:  Diagnosis Date   Anemia    Arachnoiditis    Bipolar disorder (HCC)    Cataract  removed   Chronic post-thoracotomy pain    CKD (chronic kidney disease)    GERD (gastroesophageal reflux disease)    Hypogammaglobulinemia (HCC)    Hypotension    Hypothyroid    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HCC)    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HCC)    Low blood pressure    Lung cancer (HCC) 2011, 2014   Memory loss    Migraine    Opioid abuse (HCC)    Osteoporosis    PMR (polymyalgia rheumatica) (HCC) 03/17/2022   Presence of neurostimulator    Restless legs    Sleep apnea    Allergies:  Allergies  Allergen Reactions   Butorphanol Hives, Other (See Comments) and Swelling    Cerebral pain, "like someone is brushing my brain with a brillo pad"  Other Reaction(s): Not available   Erythromycin Base Diarrhea, Itching and Other (See Comments)    Severe diarrhea   Propoxyphene Nausea Only and Other (See Comments)    Depression, Agitation, Blurred Vision  Other Reaction(s): Not available   Sumatriptan Nausea And Vomiting, Other (See Comments) and Tinitus    Ringing in the ears, migraines are worse  Other Reaction(s): Not available   Tetracycline Nausea Only, Other (See Comments) and Tinitus    Causes ringing in ears, dizziness, migraine, nausea  Other Reaction(s): Not available   Ciprofloxacin Hives, Other (See Comments) and Tinitus    Headache, dizziness, ringing in the ears  Other Reaction(s): Not available   Corticosteroids Other (See Comments)    12/02/2016 interview by ZOX: Oral dosing causes migraines/nausea/tinnitus. Prev tolerated IV and intranasal admin w/o difficulty.   Doxycycline Other (See Comments)    unknown  Other Reaction(s): Not available   Cefixime Other (See Comments)    Headache  Other Reaction(s): Not available   Isometheptene-Dichloral-Apap Other (See Comments)    Unknown, mood liability   Ketorolac      12/02/2016 interview by WRU: Oral dosing prednisone/corticosteroids causes migraines/nausea/tinnitus. Prev tolerated IV and intranasal admin w/o difficulty.  Other Reaction(s): Not available   Prednisone Other (See Comments)    Migraine, dizzy, ringing in ears-can take it IV  12/02/2016 interview by [name]: Oral prednisone dosing causes migraines/nausea/tinnitus. Prev tolerated IV and intranasal admin w/o difficulty.  Other Reaction(s): Not available   Promethazine Other (See Comments)    causes severe abdominal pain when taken IV; however she can take PO or IM  Other Reaction(s): Not available   Tape Other (See Comments)    Adhesive tapes-patient denies     Surgical History:  She  has a past surgical history that includes Gastric bypass; Spinal cord stimulator implant; Cholecystectomy; Abdominal hysterectomy; Carpal tunnel release; Cataract extraction (Bilateral, 2019); Colonoscopy (2015); DIAGNOSTIC MAMMOGRAM (2019); CT LUNG SCREENING (2018); DG  BONE DENSITY ([facility] HX) (2018); Lung cancer surgery (02/2010); Multiple tooth extractions; Port-a-cath removal (Left, 02/07/2021); Port a cath revision (Right, 04/2021); and Dental surgery (06/17/2021). Family History:  Her family history includes Arthritis in her father and mother; Asthma in her daughter and sister; Bipolar disorder in her brother and mother; Congestive Heart Failure in her father; Dementia in her father and mother; Depression in her father and sister; GER disease in her mother; Hypertension in her mother; Osteoporosis in her mother; Parkinson's disease in her father; Pneumonia in her father.  REVIEW OF SYSTEMS  : All other systems reviewed and negative except where noted in the History of Present Illness.  PHYSICAL EXAM: BP 100/62   Pulse 96   Ht 5\' 4"  (1.626 m)  Wt 139 lb 6 oz (63.2 kg)   BMI 23.92 kg/m  General Appearance: Chronically ill-appearing female, in wheelchair no apparent distress. Head:   Normocephalic and  atraumatic. Eyes:  sclerae anicteric,conjunctive pink  Respiratory: Respiratory effort normal, BS equal bilaterally without rales, rhonchi, wheezing. Cardio: RRR with no MRGs. Peripheral pulses intact.  Abdomen: Soft,  Obese ,active bowel sounds. No tenderness .Marland Kitchen No masses. Rectal: declines Musculoskeletal: Full ROM, Not tested gait, patient in wheelchair due to imbalance. With edema. Skin:  Dry and intact without significant lesions or rashes Neuro: Alert and  oriented x4, poor short-term memory and some aphasia;  No focal deficits. Psych:  Cooperative. Normal mood and affect.    Doree Albee, PA-C 2:16 PM

## 2023-09-21 ENCOUNTER — Telehealth: Payer: Self-pay

## 2023-09-21 NOTE — Telephone Encounter (Signed)
 Called and spoke with patient. We rescheduled her EGD to [facility] on 09/23/23 at 11:15 am, arriving at 9:45 am with a care partner. Pt was so grateful for the sooner appt. She is aware that I will send updated instructions via MyChart. Pt was able to pick up increased dose of Famotidine  but not Trulance . I informed pt to check with pharmacy, and if there are any issues to let us  know. I did confirm that RX was received by pharmacy yesterday afternoon. Pt verbalized understanding and had no concerns at the end of the call.   [place] has been notified that EGD has been moved up to ensure authorization is obtained.

## 2023-09-21 NOTE — Telephone Encounter (Signed)
[place], thanks [place]

## 2023-09-21 NOTE — Telephone Encounter (Addendum)
-----   Message from Elspeth SHAUNNA Naval sent at 09/20/2023  5:58 PM EST -----  Agree with assessment as outlined. We will try to schedule her for EGD at the hospital in the next available opening but this could be several weeks.  May be reasonable to do a barium swallow in the interim to see if we can help isolate etiology.   Yvonne Stopher I am adding you here to this string.  Not sure when the next availability is to add EGD at the hospital.  I am there this Thursday but not sure if any room.  If we cannot do it then, can you help put her in the next available opening and coordinate a barium swallow with tablet for her?  Thanks  ----- Message ----- From: [name], PA-C Sent: 09/20/2023   2:17 PM EST To: Elspeth SHAUNNA Naval, MD

## 2023-09-22 ENCOUNTER — Encounter (HOSPITAL_COMMUNITY): Payer: Self-pay | Admitting: Gastroenterology

## 2023-09-23 ENCOUNTER — Encounter (HOSPITAL_COMMUNITY)
Admission: RE | Disposition: A | Discharge status: Home / Self Care | Payer: Self-pay | Source: Home / Self Care | Attending: Gastroenterology

## 2023-09-23 ENCOUNTER — Encounter (HOSPITAL_COMMUNITY): Payer: Self-pay | Admitting: Gastroenterology

## 2023-09-23 ENCOUNTER — Ambulatory Visit (HOSPITAL_COMMUNITY)
Admission: RE | Admit: 2023-09-23 | Discharge: 2023-09-23 | Disposition: A | Discharge status: Home / Self Care | Payer: Medicare PPO | Attending: Gastroenterology | Admitting: Gastroenterology

## 2023-09-23 ENCOUNTER — Ambulatory Visit (HOSPITAL_COMMUNITY): Payer: Medicare PPO | Admitting: Anesthesiology

## 2023-09-23 DIAGNOSIS — D696 Thrombocytopenia, unspecified: Secondary | ICD-10-CM

## 2023-09-23 DIAGNOSIS — E039 Hypothyroidism, unspecified: Secondary | ICD-10-CM | POA: Diagnosis not present

## 2023-09-23 DIAGNOSIS — G4730 Sleep apnea, unspecified: Secondary | ICD-10-CM | POA: Insufficient documentation

## 2023-09-23 DIAGNOSIS — Z8249 Family history of ischemic heart disease and other diseases of the circulatory system: Secondary | ICD-10-CM | POA: Insufficient documentation

## 2023-09-23 DIAGNOSIS — Z980 Intestinal bypass and anastomosis status: Secondary | ICD-10-CM | POA: Diagnosis not present

## 2023-09-23 DIAGNOSIS — I129 Hypertensive chronic kidney disease with stage 1 through stage 4 chronic kidney disease, or unspecified chronic kidney disease: Secondary | ICD-10-CM | POA: Insufficient documentation

## 2023-09-23 DIAGNOSIS — N189 Chronic kidney disease, unspecified: Secondary | ICD-10-CM | POA: Diagnosis not present

## 2023-09-23 DIAGNOSIS — Z79899 Other long term (current) drug therapy: Secondary | ICD-10-CM | POA: Diagnosis not present

## 2023-09-23 DIAGNOSIS — D649 Anemia, unspecified: Secondary | ICD-10-CM | POA: Diagnosis not present

## 2023-09-23 DIAGNOSIS — K5904 Chronic idiopathic constipation: Secondary | ICD-10-CM

## 2023-09-23 DIAGNOSIS — G4733 Obstructive sleep apnea (adult) (pediatric): Secondary | ICD-10-CM

## 2023-09-23 DIAGNOSIS — R1319 Other dysphagia: Secondary | ICD-10-CM

## 2023-09-23 DIAGNOSIS — R1310 Dysphagia, unspecified: Secondary | ICD-10-CM | POA: Diagnosis not present

## 2023-09-23 DIAGNOSIS — Q399 Congenital malformation of esophagus, unspecified: Secondary | ICD-10-CM | POA: Diagnosis not present

## 2023-09-23 DIAGNOSIS — M353 Polymyalgia rheumatica: Secondary | ICD-10-CM

## 2023-09-23 DIAGNOSIS — K219 Gastro-esophageal reflux disease without esophagitis: Secondary | ICD-10-CM | POA: Diagnosis not present

## 2023-09-23 DIAGNOSIS — K2289 Other specified disease of esophagus: Secondary | ICD-10-CM | POA: Diagnosis not present

## 2023-09-23 DIAGNOSIS — I10 Essential (primary) hypertension: Secondary | ICD-10-CM

## 2023-09-23 DIAGNOSIS — K2100 Gastro-esophageal reflux disease with esophagitis, without bleeding: Secondary | ICD-10-CM | POA: Diagnosis not present

## 2023-09-23 DIAGNOSIS — Z9884 Bariatric surgery status: Secondary | ICD-10-CM | POA: Insufficient documentation

## 2023-09-23 HISTORY — PX: ESOPHAGOGASTRODUODENOSCOPY (EGD) WITH PROPOFOL: SHX5813

## 2023-09-23 HISTORY — PX: SAVORY DILATION: SHX5439

## 2023-09-23 SURGERY — ESOPHAGOGASTRODUODENOSCOPY (EGD) WITH PROPOFOL
Anesthesia: Monitor Anesthesia Care

## 2023-09-23 MED ORDER — PROPOFOL 10 MG/ML IV BOLUS
INTRAVENOUS | Status: DC | PRN
  Administered 2023-09-23: 160 ug/kg/min via INTRAVENOUS
  Administered 2023-09-23: 40 mg via INTRAVENOUS

## 2023-09-23 MED ORDER — SODIUM CHLORIDE 0.9 % IV SOLN: INTRAVENOUS | Status: DC

## 2023-09-23 MED ORDER — LIDOCAINE 2% (20 MG/ML) 5 ML SYRINGE
INTRAMUSCULAR | Status: DC | PRN
  Administered 2023-09-23: 60 mg via INTRAVENOUS

## 2023-09-23 MED ORDER — PHENYLEPHRINE 80 MCG/ML (10ML) SYRINGE FOR IV PUSH (FOR BLOOD PRESSURE SUPPORT)
PREFILLED_SYRINGE | INTRAVENOUS | Status: DC | PRN
  Administered 2023-09-23: 120 ug via INTRAVENOUS
  Administered 2023-09-23: 80 ug via INTRAVENOUS

## 2023-09-23 SURGICAL SUPPLY — 14 items

## 2023-09-23 NOTE — Anesthesia Preprocedure Evaluation (Signed)
 Anesthesia Evaluation  Patient identified by MRN, date of birth, ID band Patient awake    Reviewed: Allergy & Precautions, NPO status , Patient's Chart, lab work & pertinent test results  History of Anesthesia Complications Negative for: history of anesthetic complications  Airway Mallampati: II  TM Distance: >3 FB Neck ROM: Full    Dental  (+) Missing, Partial Lower   Pulmonary sleep apnea    Pulmonary exam normal        Cardiovascular hypertension, Normal cardiovascular exam     Neuro/Psych  Headaches   Depression Bipolar Disorder      GI/Hepatic Neg liver ROS,GERD  Medicated,,  Endo/Other  negative endocrine ROS    Renal/GU Renal InsufficiencyRenal disease     Musculoskeletal negative musculoskeletal ROS (+)    Abdominal   Peds  Hematology negative hematology ROS (+)   Anesthesia Other Findings Day of surgery medications reviewed with patient.  Reproductive/Obstetrics                             Anesthesia Physical Anesthesia Plan  ASA: 3  Anesthesia Plan: MAC   Post-op Pain Management: Minimal or no pain anticipated   Induction:   PONV Risk Score and Plan: 2 and Treatment may vary due to age or medical condition and Propofol  infusion  Airway Management Planned: Natural Airway and Nasal Cannula  Additional Equipment: None  Intra-op Plan:   Post-operative Plan:   Informed Consent: I have reviewed the patients History and Physical, chart, labs and discussed the procedure including the risks, benefits and alternatives for the proposed anesthesia with the patient or authorized representative who has indicated his/her understanding and acceptance.       Plan Discussed with: CRNA  Anesthesia Plan Comments:        Anesthesia Quick Evaluation

## 2023-09-23 NOTE — Anesthesia Postprocedure Evaluation (Signed)
 Anesthesia Post Note  Patient: Megan Brewer  Procedure(s) Performed: ESOPHAGOGASTRODUODENOSCOPY (EGD) WITH PROPOFOL  SAVORY DILATION     Patient location during evaluation: PACU Anesthesia Type: MAC Level of consciousness: awake and alert Pain management: pain level controlled Vital Signs Assessment: post-procedure vital signs reviewed and stable Respiratory status: spontaneous breathing, nonlabored ventilation and respiratory function stable Cardiovascular status: blood pressure returned to baseline Postop Assessment: no apparent nausea or vomiting Anesthetic complications: no   No notable events documented.  Last Vitals:  Vitals:   09/23/23 1015 09/23/23 1025  BP:    Pulse: 74 73  Resp: 11 16  Temp:    SpO2: 95% 100%    Last Pain:  Vitals:   09/23/23 1010  TempSrc:   PainSc: 4                  Vertell Row

## 2023-09-23 NOTE — Op Note (Signed)
 [facility] Patient Name: Megan Brewer Procedure Date: 09/23/2023 MRN: [id number] Attending MD: Elspeth SQUIBB. Leigh , MD, [id number] Date of Birth: [birth year] CSN: [id number] Age: 78 Admit Type: Outpatient Procedure:                Upper GI endoscopy Indications:              Dysphagia - pills and solids localizes to her                            throat, history of gastro-esophageal reflux disease                            - on Dexilant  historically, pyrosis controlled                            however still has regurgitation that bothers her. Providers:                Elspeth SQUIBB. Leigh, MD, Willy Hummer, RN,                            Curtistine Bishop, Technician Referring MD:              Medicines:                Monitored Anesthesia Care Complications:            No immediate complications. Estimated blood loss:                            Minimal. Estimated Blood Loss:     Estimated blood loss was minimal. Procedure:                Pre-Anesthesia Assessment:                           - Prior to the procedure, a History and Physical                            was performed, and patient medications and                            allergies were reviewed. The patient's tolerance of                            previous anesthesia was also reviewed. The risks                            and benefits of the procedure and the sedation                            options and risks were discussed with the patient.                            All questions were answered, and informed consent  was obtained. Prior Anticoagulants: The patient has                            taken no anticoagulant or antiplatelet agents. ASA                            Grade Assessment: III - A patient with severe                            systemic disease. After reviewing the risks and                            benefits, the patient was deemed in satisfactory                             condition to undergo the procedure.                           After obtaining informed consent, the endoscope was                            passed under direct vision. Throughout the                            procedure, the patient's blood pressure, pulse, and                            oxygen saturations were monitored continuously. The                            GIF-H190 ([id number]) Olympus endoscope was introduced                            through the mouth, and advanced to the proximal                            jejunum. The upper GI endoscopy was accomplished                            without difficulty. The patient tolerated the                            procedure well. Scope In: Scope Out: Findings:      The Z-line was regular and was found 38 cm from the incisors.      The examined esophagus was tortuous.      The exam of the esophagus was otherwise normal. No focal stenosis /       stricture appreciated.      A guidewire was placed and the scope was withdrawn. Empiric dilation was       performed in the entire esophagus with a Savary dilator at 16 mm. There       was moderate resistance when advancing the dilator, further dilation not       performed. Relook endoscopy showed some small heme but no obvious  mucosal disruption.      Evidence of a Roux-en-Y gastrojejunostomy was found. The gastrojejunal       anastomosis was characterized by healthy appearing mucosa.      The gastric pouch is extremely small with laxity at the GEJ. The gastric       was otherwise normal. Retroflexed views not possible given small size of       the pouch.      The examined small bowel and blind limb is normal. Impression:               - Z-line regular, 38 cm from the incisors.                           - Tortuous esophagus.                           - Normal esophagus otherwise - empiric dilation                            performed to 16mm with some resistance passing  the                            dilator but no obvious mucosal disruption.                           - Roux-en-Y gastrojejunostomy with gastrojejunal                            anastomosis characterized by healthy appearing                            mucosa.                           - Normal examined small bowel limb. Moderate Sedation:      No moderate sedation, case performed with MAC Recommendation:           - Patient has a contact number available for                            emergencies. The signs and symptoms of potential                            delayed complications were discussed with the                            patient. Return to normal activities tomorrow.                            Written discharge instructions were provided to the                            patient.                           - Post dilation diet.                           -  Continue present medications.                           - Await course post dilation. If symptoms persist                            consideration for modified barium swallow. Procedure Code(s):        --- Professional ---                           [phone], Esophagogastroduodenoscopy, flexible,                            transoral; with insertion of guide wire followed by                            passage of dilator(s) through esophagus over guide                            wire Diagnosis Code(s):        --- Professional ---                           Q39.9, Congenital malformation of esophagus,                            unspecified                           Z98.0, Intestinal bypass and anastomosis status                           R13.10, Dysphagia, unspecified                           K21.9, Gastro-esophageal reflux disease without                            esophagitis CPT copyright 2022 American Medical Association. All rights reserved. The codes documented in this report are preliminary and upon coder review may  be  revised to meet current compliance requirements. Elspeth P. Ayjah Show, MD 09/23/2023 10:10:29 AM This report has been signed electronically. Number of Addenda: 0

## 2023-09-23 NOTE — H&P (Signed)
  Gastroenterology History and Physical   Primary Care Physician:  Megan Megan Mt, DO   Reason for Procedure:   Dysphagia, GERD  Plan:    EGD with possible dilation     HPI: Megan Brewer is a 78 y.o. female  here for EGD to evaluate dysphagia and GERD. Longstanding GERD on Dexilant  and pepcid , still has some regurgitation. History of gastric bypass. She has recently developed dysphagia to pills and solids, feels it mostly in her throat when things get stuck. This is new for her.   Discussed EGD, risks benefits of that and anesthesia, she wants to proceed.  Otherwise feels well without any cardiopulmonary symptoms.   I have discussed risks / benefits of anesthesia and endoscopic procedure with Megan Brewer and they wish to proceed with the exams as outlined today.    Past Medical History:  Diagnosis Date   Anemia    Arachnoiditis    Bipolar disorder (HCC)    Cataract    removed   Chronic post-thoracotomy pain    CKD (chronic kidney disease)    GERD (gastroesophageal reflux disease)    Hypogammaglobulinemia (HCC)    Hypotension    Hypothyroid    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HCC)    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HCC)    Low blood pressure    Lung cancer (HCC) 2011, 2014   Memory loss    Migraine    Opioid abuse (HCC)    Osteoporosis    PMR (polymyalgia rheumatica) (HCC) 03/17/2022   Presence of neurostimulator    Restless legs    Sleep apnea     Past Surgical History:  Procedure Laterality Date   ABDOMINAL HYSTERECTOMY     CARPAL TUNNEL RELEASE     CATARACT EXTRACTION Bilateral 2019   CHOLECYSTECTOMY     COLONOSCOPY  2015   [facility]   CT LUNG SCREENING  2018   DENTAL SURGERY  06/17/2021   4 teeth rmoved   DG  BONE DENSITY ([facility] HX)  2018   DIAGNOSTIC MAMMOGRAM  2019   GASTRIC BYPASS     LUNG CANCER SURGERY  02/2010   MULTIPLE TOOTH EXTRACTIONS     PORT A CATH REVISION Right 04/2021   PORT-A-CATH REMOVAL Left 02/07/2021    Procedure: REMOVAL PORT-A-CATH;  Surgeon: Megan Herlene Righter, Brewer;  Location: [facility];  Service: General;  Laterality: Left;  60 MINUTES ROOM 4   SPINAL CORD STIMULATOR IMPLANT      Prior to Admission medications   Medication Sig Start Date End Date Taking? Authorizing Provider  acetaminophen  (TYLENOL ) 500 MG tablet Take 1,000 mg by mouth as needed for moderate pain.   Yes Provider, Historical, Brewer  baclofen  (LIORESAL ) 10 MG tablet Take 10 mg by mouth daily as needed for muscle spasms. 06/03/20  Yes Provider, Historical, Brewer  CALCIUM  PO Take 6 tablets by mouth daily.   Yes Provider, Historical, Brewer  cholecalciferol  (VITAMIN D3) 25 MCG (1000 UNIT) tablet Take 1,000 Units by mouth daily.   Yes Provider, Historical, Brewer  denosumab  (PROLIA ) 60 MG/ML SOSY injection Pt to get at office.  Appt is 08/27/23 07/14/23  Yes Wendling, Megan Mt, DO  dexlansoprazole  (DEXILANT ) 60 MG capsule Take 1 capsule (60 mg total) by mouth daily. 07/26/23  Yes Megan Palma R, PA-C  diclofenac  Sodium (VOLTAREN ) 1 % GEL Apply 2 g topically 4 (four) times daily. 07/27/23  Yes Megan Sluder, PA-C  dicyclomine  (BENTYL ) 20 MG tablet Take 20 mg by mouth 4 (four) times daily.  09/04/23  Yes Provider, Historical, Brewer  famotidine  (PEPCID ) 20 MG tablet Take 1 tablet (20 mg total) by mouth 2 (two) times daily. 09/20/23  Yes Megan Alan SAUNDERS, PA-C  ferrous sulfate  325 (65 FE) MG tablet Take 650 mg by mouth daily with breakfast.   Yes Provider, Historical, Brewer  fludrocortisone  (FLORINEF ) 0.1 MG tablet Take 1 tablet (0.1 mg total) by mouth 2 (two) times daily. 06/28/23  Yes Wendling, Megan Mt, DO  GAMMAGARD 20 GM/200ML SOLN Inject 40 g into the vein every 21 ( twenty-one) days. INFUSE 40G INTRAVENOUSLY EVERY 3 WEEKS 02/27/22  Yes Provider, Historical, Brewer  heparin  lock flush 100 UNIT/ML SOLN injection Inject 500 Units into the vein every 21 ( twenty-one) days. 03/31/21  Yes Provider, Historical, Brewer  ibuprofen  (ADVIL ) 200 MG tablet Take  600 mg by mouth as needed for mild pain (pain score 1-3).   Yes Provider, Historical, Brewer  levothyroxine  (SYNTHROID ) 75 MCG tablet TAKE ONE (1) TABLET BY MOUTH EACH DAY BEFORE BREAKFAST ON EMPTY STOMACH 07/19/23  Yes Megan Megan Mt, DO  magnesium  oxide (MAG-OX) 400 MG tablet Take 400 mg by mouth daily.    Yes Provider, Historical, Brewer  methylphenidate  (RITALIN ) 10 MG tablet Take 1 tablet (10 mg total) by mouth daily. 09/15/23 09/14/24 Yes [name], Megan Megan Brewer  midodrine  (PROAMATINE ) 10 MG tablet TAKE ONE (1) TABLET BY MOUTH 3 TIMES DAILY 08/09/23  Yes Megan Megan Mt, DO  MYRBETRIQ  50 MG TB24 tablet Take 50 mg by mouth daily. 10/07/22  Yes Provider, Historical, Brewer  potassium chloride  (KLOR-CON ) 10 MEQ tablet TAKE ONE (1) TABLET BY MOUTH FOUR (4) TIMES DAILY 04/26/23  Yes Megan Megan Mt, DO  pregabalin  (LYRICA ) 50 MG capsule Take 1 capsule (50 mg total) by mouth 3 (three) times daily. 07/22/23  Yes Megan Megan Mt, DO  pyridoxine (B-6) 100 MG tablet Take 100 mg by mouth daily.   Yes Megan Megan Mt, DO  torsemide  40 MG TABS Take 20 mg by mouth 2 (two) times daily. 03/31/23  Yes Wittenborn, Barnie, NP  vitamin B-12 (CYANOCOBALAMIN ) 1000 MCG tablet Take 1,000 mcg by mouth daily. 02/14/21  Yes Provider, Historical, Brewer  Vitamin D , Ergocalciferol , (DRISDOL ) 1.25 MG (50000 UNIT) CAPS capsule TAKE 1 CAPSULE ONCE WEEKLY 01/04/23  Yes [name], Megan Megan Brewer  cycloSPORINE  (RESTASIS ) 0.05 % ophthalmic emulsion Place 1 drop into both eyes in the morning, at noon, in the evening, and at bedtime.    Provider, Historical, Brewer  KEVZARA 200 MG/1. SOAJ Inject 1.14 mLs into the skin every 14 (fourteen) days. 09/09/22   Provider, Historical, Brewer  lidocaine  4 % Place 1 patch onto the skin daily. 07/08/23   Megan Prentice SAUNDERS, Brewer  naloxone  (NARCAN ) nasal spray 4 mg/0.1 mL Spray up nostril in event of opioid overdose. 06/28/23   Megan Megan Mt, DO  Plecanatide  (TRULANCE ) 3 MG  TABS Take 1 tablet (3 mg total) by mouth daily. 09/20/23 12/19/23  Megan Alan SAUNDERS, PA-C  sucralfate  (CARAFATE ) 1 GM/10ML suspension Take 10 mLs (1 g total) by mouth 4 (four) times daily as needed (AB pain, GERD, nausea). 07/26/23   Collier, Amanda R, PA-C  triamcinolone  cream (KENALOG ) 0.1 % Apply 1 application  topically 2 (two) times daily as needed (rash).    Provider, Historical, Brewer  ZINC  OXIDE, TOPICAL, 10 % CREA Apply 1 application topically 2 (two) times daily as needed (rash). 02/14/21   [name], Brewer    Current Facility-Administered Medications  Medication Dose Route  Frequency Provider Last Rate Last Admin   0.9 %  sodium chloride  infusion   Intravenous Continuous Megan Alan SAUNDERS, PA-C       Facility-Administered Medications Ordered in Other Encounters  Medication Dose Route Frequency Provider Last Rate Last Admin   methylPREDNISolone  sodium succinate (SOLU-MEDROL ) 1,000 mg in sodium chloride  0.9 % 50 mL IVPB  1,000 mg Intravenous Once Patel, Y[place], [facility]       methylPREDNISolone  sodium succinate (SOLU-MEDROL ) 1,000 mg in sodium chloride  0.9 % 50 mL IVPB  1,000 mg Intravenous Once Patel, Yatin, [facility]        Allergies as of 09/20/2023 - Review Complete 09/20/2023  Allergen Reaction Noted   Butorphanol Hives, Other (See Comments), and Swelling 09/10/2006   Erythromycin base Diarrhea, Itching, and Other (See Comments) 11/24/2012   Propoxyphene Nausea Only and Other (See Comments) 09/10/2006   Sumatriptan  Nausea And Vomiting, Other (See Comments), and Tinitus 01/29/2021   Tetracycline Nausea Only, Other (See Comments), and Tinitus 11/24/2012   Ciprofloxacin Hives, Other (See Comments), and Tinitus 09/10/2006   Corticosteroids Other (See Comments) 09/10/2006   Doxycycline  Other (See Comments) 09/10/2006   Cefixime Other (See Comments) 09/10/2006   Isometheptene-dichloral-apap Other (See Comments) 05/29/2017   Ketorolac   03/26/2014   Prednisone  Other (See Comments) 11/24/2012    Promethazine  Other (See Comments) 11/24/2012   Tape Other (See Comments) 07/29/2017    Family History  Problem Relation Age of Onset   Hypertension Mother    GER disease Mother    Dementia Mother    Osteoporosis Mother    Arthritis Mother    Bipolar disorder Mother    Parkinson's disease Father    Congestive Heart Failure Father    Pneumonia Father    Arthritis Father    Dementia Father    Depression Father    Asthma Sister    Depression Sister    Bipolar disorder Brother    Asthma Daughter    Colon cancer Neg Hx    Esophageal cancer Neg Hx    Stomach cancer Neg Hx    Rectal cancer Neg Hx     Social History   Socioeconomic History   Marital status: Married    Spouse name: Not on file   Number of children: 2   Years of education: Not on file   Highest education level: GED or equivalent  Occupational History   Occupation: retired  Tobacco Use   Smoking status: Never   Smokeless tobacco: Never  Vaping Use   Vaping status: Never Used  Substance and Sexual Activity   Alcohol use: Not Currently   Drug use: Never   Sexual activity: Not on file  Other Topics Concern   Not on file  Social History Narrative   Diet: None      Caffeine : coffee, tea, sodas less than or 1 daily.      Married, if yes what year: Yes, 1972      Do you live in a house, apartment, assisted living, condo, trailer, ect: Two story house       Pets: None      Current/Past profession: Teach      Exercise: None         Living Will: No   DNR: No   POA/HPOA: No      Functional Status:   Do you have difficulty bathing or dressing yourself? yes   Do you have difficulty preparing food or eating? yes   Do you have difficulty managing your medications? yes  Do you have difficulty managing your finances?yes   Do you have difficulty affording your medications? No      Right Handed    Lives in a two story home    Social Drivers of Health   Financial Resource Strain: Low Risk  (12/16/2022)    Received from [facility], [facility]   Overall Financial Resource Strain (CARDIA)    Difficulty of Paying Living Expenses: Not hard at all  Food Insecurity: No Food Insecurity (09/09/2023)   Hunger Vital Sign    Worried About Running Out of Food in the Last Year: Never true    Ran Out of Food in the Last Year: Never true  Transportation Needs: No Transportation Needs (09/09/2023)   PRAPARE - Administrator, Civil Service (Medical): No    Lack of Transportation (Non-Medical): No  Physical Activity: Inactive (03/19/2023)   Exercise Vital Sign    Days of Exercise per Week: 0 days    Minutes of Exercise per Session: 0 min  Stress: No Stress Concern Present (03/19/2023)   [employer] of Occupational Health - Occupational Stress Questionnaire    Feeling of Stress : Not at all  Social Connections: Socially Isolated (03/19/2023)   Social Connection and Isolation Panel [NHANES]    Frequency of Communication with Friends and Family: Once a week    Frequency of Social Gatherings with Friends and Family: Once a week    Attends Religious Services: Never    Database Administrator or Organizations: No    Attends Banker Meetings: Never    Marital Status: Married  Catering Manager Violence: Not At Risk (09/09/2023)   Humiliation, Afraid, Rape, and Kick questionnaire    Fear of Current or Ex-Partner: No    Emotionally Abused: No    Physically Abused: No    Sexually Abused: No    Review of Systems: All other review of systems negative except as mentioned in the HPI.  Physical Exam: Vital signs BP (!) 158/54   Pulse 76   Temp 97.7 F (36.5 C) (Temporal)   Resp 18   Ht 5' 4 (1.626 m)   Wt 63.2 kg   SpO2 100%   BMI 23.92 kg/m   General:   Alert,  Well-developed, pleasant and cooperative in NAD Lungs:  Clear throughout to auscultation.   Heart:  Regular rate and rhythm Abdomen:  Soft, nondistended.  Some epigastric  TTP Neuro/Psych:  Alert and cooperative. Normal mood and affect. A and O x 3  Marcey Naval, Brewer [facility]

## 2023-09-23 NOTE — Progress Notes (Signed)
 Pt has a history of hypotension.  Arrived to recovery with low bp.  Treated by crna.

## 2023-09-23 NOTE — Discharge Instructions (Signed)
 Post dilation diet.  Clear liquids x 2 hours then soft diet today.  YOU HAD AN ENDOSCOPIC PROCEDURE TODAY: Refer to the procedure report and other information in the discharge instructions given to you for any specific questions about what was found during the examination. If this information does not answer your questions, please call [place] office at [phone] to clarify.   YOU SHOULD EXPECT: Some feelings of bloating in the abdomen. Passage of more gas than usual. Walking can help get rid of the air that was put into your GI tract during the procedure and reduce the bloating. If you had a lower endoscopy (such as a colonoscopy or flexible sigmoidoscopy) you may notice spotting of blood in your stool or on the toilet paper. Some abdominal soreness may be present for a day or two, also.  DIET: Your first meal following the procedure should be a light meal and then it is ok to progress to your normal diet. A half-sandwich or bowl of soup is an example of a good first meal. Heavy or fried foods are harder to digest and may make you feel nauseous or bloated. Drink plenty of fluids but you should avoid alcoholic beverages for 24 hours. If you had a esophageal dilation, please see attached instructions for diet.    ACTIVITY: Your care partner should take you home directly after the procedure. You should plan to take it easy, moving slowly for the rest of the day. You can resume normal activity the day after the procedure however YOU SHOULD NOT DRIVE, use power tools, machinery or perform tasks that involve climbing or major physical exertion for 24 hours (because of the sedation medicines used during the test).   SYMPTOMS TO REPORT IMMEDIATELY: A gastroenterologist can be reached at any hour. Please call [phone]  for any of the following symptoms:  Following upper endoscopy (EGD, EUS, ERCP, esophageal dilation) Vomiting of blood or coffee ground material  New, significant abdominal pain  New,  significant chest pain or pain under the shoulder blades  Painful or persistently difficult swallowing  New shortness of breath  Black, tarry-looking or red, bloody stools  FOLLOW UP:  If any biopsies were taken you will be contacted by phone or by letter within the next 1-3 weeks. Call [phone]  if you have not heard about the biopsies in 3 weeks.  Please also call with any specific questions about appointments or follow up tests.

## 2023-09-23 NOTE — Transfer of Care (Signed)
 Immediate Anesthesia Transfer of Care Note  Patient: Megan Brewer  Procedure(s) Performed: ESOPHAGOGASTRODUODENOSCOPY (EGD) WITH PROPOFOL  SAVORY DILATION  Patient Location: PACU  Anesthesia Type:MAC  Level of Consciousness: alert   Airway & Oxygen Therapy: Patient Spontanous Breathing  Post-op Assessment: Report given to RN  Post vital signs: Reviewed and stable  Last Vitals:  Vitals Value Taken Time  BP 131/45 09/23/23 1010  Temp    Pulse 72 09/23/23 1010  Resp 15 09/23/23 1010  SpO2 99 % 09/23/23 1010  Vitals shown include unfiled device data.  Last Pain:  Vitals:   09/23/23 0844  TempSrc: Temporal  PainSc: 5          Complications: No notable events documented.

## 2023-09-24 ENCOUNTER — Encounter: Payer: Self-pay | Admitting: Family Medicine

## 2023-09-24 ENCOUNTER — Ambulatory Visit: Payer: Medicare PPO | Admitting: Family Medicine

## 2023-09-24 VITALS — BP 106/60 | Ht 64.0 in | Wt 139.0 lb

## 2023-09-24 DIAGNOSIS — S46811S Strain of other muscles, fascia and tendons at shoulder and upper arm level, right arm, sequela: Secondary | ICD-10-CM | POA: Diagnosis not present

## 2023-09-24 NOTE — Progress Notes (Signed)
 CHIEF COMPLAINT: No chief complaint on file.  _____________________________________________________________ SUBJECTIVE  HPI  Pt is a 78 y.o. female here for evaluation for trial trigger point injection  Just had a procedure yesterday, and was put under anesthesia. States they positioned her arm while she was under and it seemed to exacerbate some of her shoulder/upper back pain. Denies paresthesias/numbness tingling, pain severity and location are unchanged   ------------------------------------------------------------------------------------------------------ Past Medical History:  Diagnosis Date   Anemia    Arachnoiditis    Bipolar disorder (HCC)    Cataract    removed   Chronic post-thoracotomy pain    CKD (chronic kidney disease)    GERD (gastroesophageal reflux disease)    Hypogammaglobulinemia (HCC)    Hypotension    Hypothyroid    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HCC)    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HCC)    Low blood pressure    Lung cancer (HCC) 2011, 2014   Memory loss    Migraine    Opioid abuse (HCC)    Osteoporosis    PMR (polymyalgia rheumatica) (HCC) 03/17/2022   Presence of neurostimulator    Restless legs    Sleep apnea     Past Surgical History:  Procedure Laterality Date   ABDOMINAL HYSTERECTOMY     CARPAL TUNNEL RELEASE     CATARACT EXTRACTION Bilateral 2019   CHOLECYSTECTOMY     COLONOSCOPY  2015   [facility]   CT LUNG SCREENING  2018   DENTAL SURGERY  06/17/2021   4 teeth rmoved   DG  BONE DENSITY ([facility] HX)  2018   DIAGNOSTIC MAMMOGRAM  2019   ESOPHAGOGASTRODUODENOSCOPY (EGD) WITH PROPOFOL  N/A 09/23/2023   Procedure: ESOPHAGOGASTRODUODENOSCOPY (EGD) WITH PROPOFOL ;  Surgeon: Leigh Elspeth SQUIBB, MD;  Location: [facility];  Service: Gastroenterology;  Laterality: N/A;   GASTRIC BYPASS     LUNG CANCER SURGERY  02/2010   MULTIPLE TOOTH EXTRACTIONS     PORT A CATH REVISION Right 04/2021   PORT-A-CATH REMOVAL Left 02/07/2021   Procedure: REMOVAL  PORT-A-CATH;  Surgeon: Kinsinger, Herlene Righter, MD;  Location: [facility];  Service: General;  Laterality: Left;  60 MINUTES ROOM 4   SAVORY DILATION N/A 09/23/2023   Procedure: SAVORY DILATION;  Surgeon: Leigh Elspeth SQUIBB, MD;  Location: [facility];  Service: Gastroenterology;  Laterality: N/A;   SPINAL CORD STIMULATOR IMPLANT        Outpatient Encounter Medications as of 09/24/2023  Medication Sig Note   acetaminophen  (TYLENOL ) 500 MG tablet Take 1,000 mg by mouth as needed for moderate pain. 12/22/2022: PRN   baclofen  (LIORESAL ) 10 MG tablet Take 10 mg by mouth daily as needed for muscle spasms.    CALCIUM  PO Take 6 tablets by mouth daily. 09/09/2023: Reports takes 2 tablets daily   cholecalciferol  (VITAMIN D3) 25 MCG (1000 UNIT) tablet Take 1,000 Units by mouth daily.    cycloSPORINE  (RESTASIS ) 0.05 % ophthalmic emulsion Place 1 drop into both eyes in the morning, at noon, in the evening, and at bedtime. 09/09/2023: As needed   denosumab  (PROLIA ) 60 MG/ML SOSY injection Pt to get at office.  Appt is 08/27/23    dexlansoprazole  (DEXILANT ) 60 MG capsule Take 1 capsule (60 mg total) by mouth daily.    diclofenac  Sodium (VOLTAREN ) 1 % GEL Apply 2 g topically 4 (four) times daily.    dicyclomine  (BENTYL ) 20 MG tablet Take 20 mg by mouth 4 (four) times daily.    famotidine  (PEPCID ) 20 MG tablet Take 1 tablet (20 mg total) by mouth  2 (two) times daily.    ferrous sulfate  325 (65 FE) MG tablet Take 650 mg by mouth daily with breakfast.    fludrocortisone  (FLORINEF ) 0.1 MG tablet Take 1 tablet (0.1 mg total) by mouth 2 (two) times daily.    GAMMAGARD 20 GM/200ML SOLN Inject 40 g into the vein every 21 ( twenty-one) days. INFUSE 40G INTRAVENOUSLY EVERY 3 WEEKS    heparin  lock flush 100 UNIT/ML SOLN injection Inject 500 Units into the vein every 21 ( twenty-one) days.    ibuprofen  (ADVIL ) 200 MG tablet Take 600 mg by mouth as needed for mild pain (pain score 1-3).    KEVZARA 200 MG/1. SOAJ Inject 1.14 mLs  into the skin every 14 (fourteen) days.    levothyroxine  (SYNTHROID ) 75 MCG tablet TAKE ONE (1) TABLET BY MOUTH EACH DAY 30MINUTES BEFORE BREAKFAST ON EMPTY STOMACH    lidocaine  4 % Place 1 patch onto the skin daily. 09/09/2023: Patient reports takes As needed   magnesium  oxide (MAG-OX) 400 MG tablet Take 400 mg by mouth daily.     methylphenidate  (RITALIN ) 10 MG tablet Take 1 tablet (10 mg total) by mouth daily.    midodrine  (PROAMATINE ) 10 MG tablet TAKE ONE (1) TABLET BY MOUTH 3 TIMES DAILY    MYRBETRIQ  50 MG TB24 tablet Take 50 mg by mouth daily.    naloxone  (NARCAN ) nasal spray 4 mg/0.1 mL Spray up nostril in event of opioid overdose. 09/23/2023: Never used   Plecanatide  (TRULANCE ) 3 MG TABS Take 1 tablet (3 mg total) by mouth daily. 09/23/2023: Not started   potassium chloride  (KLOR-CON ) 10 MEQ tablet TAKE ONE (1) TABLET BY MOUTH FOUR (4) TIMES DAILY    pregabalin  (LYRICA ) 50 MG capsule Take 1 capsule (50 mg total) by mouth 3 (three) times daily.    pyridoxine (B-6) 100 MG tablet Take 100 mg by mouth daily.    sucralfate  (CARAFATE ) 1 GM/10ML suspension Take 10 mLs (1 g total) by mouth 4 (four) times daily as needed (AB pain, GERD, nausea).    torsemide  40 MG TABS Take 20 mg by mouth 2 (two) times daily.    triamcinolone  cream (KENALOG ) 0.1 % Apply 1 application  topically 2 (two) times daily as needed (rash).    vitamin B-12 (CYANOCOBALAMIN ) 1000 MCG tablet Take 1,000 mcg by mouth daily.    Vitamin D , Ergocalciferol , (DRISDOL ) 1.25 MG (50000 UNIT) CAPS capsule TAKE 1 CAPSULE ONCE WEEKLY    ZINC  OXIDE, TOPICAL, 10 % CREA Apply 1 application topically 2 (two) times daily as needed (rash). 12/22/2022: PRN   Facility-Administered Encounter Medications as of 09/24/2023  Medication   denosumab  (PROLIA ) injection 60 mg   methylPREDNISolone  sodium succinate (SOLU-MEDROL ) 1,000 mg in sodium chloride  0.9 % 50 mL IVPB   methylPREDNISolone  sodium succinate (SOLU-MEDROL ) 1,000 mg in sodium chloride  0.9 % 50  mL IVPB    ------------------------------------------------------------------------------------------------------  _____________________________________________________________ OBJECTIVE  PHYSICAL EXAM  Today's Vitals   09/24/23 1059  BP: 106/60  Weight: 139 lb (63 kg)  Height: 5' 4 (1.626 m)   Body mass index is 23.86 kg/m.   reviewed  General: A+Ox3, no acute distress, well-nourished, appropriate affect CV: pulses 2+ regular, nondiaphoretic, no peripheral edema, cap refill <2sec Lungs: no audible wheezing, non-labored breathing, bilateral chest rise/fall, nontachypneic Skin: warm, well-perfused, non-icteric, no susp lesions or rashes Neuro:  Sensation intact, muscle tone wnl, no atrophy Psych: no signs of depression or anxiety MSK: shoulder testing unrevealing, some subacromial space tenderness, all testing provocative of posterior  upper back/trapezius pain. Several trigger points identified spanning trapezius distribution. Visible mid back hematoma site, paradoxical movement with breathing/speaking, no overlying skin changes, TTP. Pt denies shortness of breath/pleuritic chest pain  _____________________________________________________________ ASSESSMENT/PLAN Diagnoses and all orders for this visit:  Strain of right trapezius muscle, sequela  Discussed options for management. Does not appear to have predominantly shoulder joint/impingement symptoms today, findings most consistent with trapezius strain w/ trigger points. Interested in trigger point injection today.   Trigger Point Injection PROCEDURE:  Risks & benefits of trigger point injections reviewed.  Consent obtained.  Time-out completed. US  utilized to confirm appropriate soft tissue depth given proximity to chest cavity. Patient prepped and draped in the normal fashion.  Area cleansed with alcohol and chlorhexidine .  Ethyl chloride spray used to anesthetize the skin.  Solution of 1 mL 1% lidocaine  without epinephrine   injected into (8) trigger point/points in the proximal, mid-body, lateral, and inferior trapezius muscle.  Patient tolerated procedure well without any complications.  Area covered with adhesive bandages.  Post-procedure care reviewed, all questions answered. Discussed trial of Lidocaine  patches over upper back PRN. All questions answered. Return precautions discussed. Patient verbalized understanding and is in agreement with plan  Electronically signed by: Yeison Sippel W Lidia Clavijo, MD 09/24/2023 6:44 AM

## 2023-09-29 DIAGNOSIS — M542 Cervicalgia: Secondary | ICD-10-CM | POA: Diagnosis not present

## 2023-09-29 DIAGNOSIS — M62838 Other muscle spasm: Secondary | ICD-10-CM | POA: Diagnosis not present

## 2023-10-02 DIAGNOSIS — L03113 Cellulitis of right upper limb: Secondary | ICD-10-CM | POA: Diagnosis not present

## 2023-10-08 ENCOUNTER — Other Ambulatory Visit: Payer: Self-pay | Admitting: Family Medicine

## 2023-10-09 DIAGNOSIS — M542 Cervicalgia: Secondary | ICD-10-CM | POA: Diagnosis not present

## 2023-10-09 DIAGNOSIS — M62838 Other muscle spasm: Secondary | ICD-10-CM | POA: Diagnosis not present

## 2023-10-14 ENCOUNTER — Other Ambulatory Visit: Payer: Self-pay | Admitting: *Deleted

## 2023-10-14 DIAGNOSIS — M810 Age-related osteoporosis without current pathological fracture: Secondary | ICD-10-CM

## 2023-10-14 MED ORDER — DENOSUMAB 60 MG/ML ~~LOC~~ SOSY
60.0000 mg | PREFILLED_SYRINGE | SUBCUTANEOUS | Status: AC
  Administered 2024-03-06: 60 mg via SUBCUTANEOUS

## 2023-10-27 DIAGNOSIS — R251 Tremor, unspecified: Secondary | ICD-10-CM | POA: Diagnosis not present

## 2023-10-27 DIAGNOSIS — R2681 Unsteadiness on feet: Secondary | ICD-10-CM | POA: Diagnosis not present

## 2023-10-27 DIAGNOSIS — R269 Unspecified abnormalities of gait and mobility: Secondary | ICD-10-CM | POA: Diagnosis not present

## 2023-10-27 DIAGNOSIS — F419 Anxiety disorder, unspecified: Secondary | ICD-10-CM | POA: Diagnosis not present

## 2023-10-27 DIAGNOSIS — G259 Extrapyramidal and movement disorder, unspecified: Secondary | ICD-10-CM | POA: Diagnosis not present

## 2023-10-27 DIAGNOSIS — R4589 Other symptoms and signs involving emotional state: Secondary | ICD-10-CM | POA: Diagnosis not present

## 2023-10-28 ENCOUNTER — Ambulatory Visit: Payer: Medicare PPO

## 2023-10-28 ENCOUNTER — Ambulatory Visit

## 2023-10-28 DIAGNOSIS — H43813 Vitreous degeneration, bilateral: Secondary | ICD-10-CM | POA: Diagnosis not present

## 2023-10-28 DIAGNOSIS — H35033 Hypertensive retinopathy, bilateral: Secondary | ICD-10-CM | POA: Diagnosis not present

## 2023-10-28 DIAGNOSIS — H26492 Other secondary cataract, left eye: Secondary | ICD-10-CM | POA: Diagnosis not present

## 2023-10-28 DIAGNOSIS — Z961 Presence of intraocular lens: Secondary | ICD-10-CM | POA: Diagnosis not present

## 2023-10-29 ENCOUNTER — Ambulatory Visit

## 2023-10-29 VITALS — BP 103/68 | HR 69 | Temp 97.5°F | Resp 16 | Ht 64.0 in

## 2023-10-29 DIAGNOSIS — M2550 Pain in unspecified joint: Secondary | ICD-10-CM

## 2023-10-29 DIAGNOSIS — M353 Polymyalgia rheumatica: Secondary | ICD-10-CM | POA: Diagnosis not present

## 2023-10-29 DIAGNOSIS — M542 Cervicalgia: Secondary | ICD-10-CM | POA: Diagnosis not present

## 2023-10-29 DIAGNOSIS — M62838 Other muscle spasm: Secondary | ICD-10-CM | POA: Diagnosis not present

## 2023-10-29 MED ORDER — HEPARIN SOD (PORK) LOCK FLUSH 100 UNIT/ML IV SOLN
500.0000 [IU] | Freq: Once | INTRAVENOUS | Status: AC | PRN
  Administered 2023-10-29: 500 [IU]

## 2023-10-29 MED ORDER — SODIUM CHLORIDE 0.9 % IV SOLN
1000.0000 mg | Freq: Once | INTRAVENOUS | Status: AC
  Administered 2023-10-29: 1000 mg via INTRAVENOUS
  Filled 2023-10-29: qty 16

## 2023-10-29 NOTE — Progress Notes (Signed)
 Diagnosis: Polyarthralgia   Provider:  Chilton Greathouse MD  Procedure: IV Infusion  IV Type: Port a Cath, IV Location: R Chest  Solumedrol (Methylprednisolone), Dose: 1000 mg  Infusion Start Time: 1506  Infusion Stop Time: 1606  Post Infusion IV Care: Patient declined observation and Port a Cath Deaccessed/Flushed and heparin locked  Discharge: Condition: Stable, Destination: Home . AVS Declined  Performed by:  Wyvonne Lenz, RN

## 2023-11-01 ENCOUNTER — Other Ambulatory Visit: Payer: Self-pay | Admitting: Family Medicine

## 2023-11-01 DIAGNOSIS — R531 Weakness: Secondary | ICD-10-CM

## 2023-11-03 DIAGNOSIS — D803 Selective deficiency of immunoglobulin G [IgG] subclasses: Secondary | ICD-10-CM | POA: Diagnosis not present

## 2023-11-03 DIAGNOSIS — M353 Polymyalgia rheumatica: Secondary | ICD-10-CM | POA: Diagnosis not present

## 2023-11-03 DIAGNOSIS — M797 Fibromyalgia: Secondary | ICD-10-CM | POA: Diagnosis not present

## 2023-11-03 DIAGNOSIS — Z79899 Other long term (current) drug therapy: Secondary | ICD-10-CM | POA: Diagnosis not present

## 2023-11-04 ENCOUNTER — Telehealth: Payer: Self-pay

## 2023-11-04 NOTE — Telephone Encounter (Signed)
 Appt scheduled for 11/05/23

## 2023-11-04 NOTE — Telephone Encounter (Signed)
 Copied from CRM [id number]. Topic: Clinical - Medical Advice >> Nov 04, 2023  9:48 AM Fonda Kinder J wrote: Reason for CRM: Pt states he was advised to contact her PCP by her specialty provider after her being notified her liver enzymes were elevated and she was advised not to take any tylenol or asprin. Pt wants to know what she should do for migraines? She also wants to know if she needs to come in and be evaluated again following these findings

## 2023-11-08 ENCOUNTER — Encounter: Payer: Self-pay | Admitting: Family Medicine

## 2023-11-08 ENCOUNTER — Ambulatory Visit (INDEPENDENT_AMBULATORY_CARE_PROVIDER_SITE_OTHER): Admitting: Family Medicine

## 2023-11-08 VITALS — BP 116/62 | HR 65 | Temp 97.2°F | Ht 64.0 in

## 2023-11-08 DIAGNOSIS — G43009 Migraine without aura, not intractable, without status migrainosus: Secondary | ICD-10-CM

## 2023-11-08 DIAGNOSIS — M62838 Other muscle spasm: Secondary | ICD-10-CM | POA: Diagnosis not present

## 2023-11-08 DIAGNOSIS — R7989 Other specified abnormal findings of blood chemistry: Secondary | ICD-10-CM

## 2023-11-08 DIAGNOSIS — M542 Cervicalgia: Secondary | ICD-10-CM | POA: Diagnosis not present

## 2023-11-08 DIAGNOSIS — I6782 Cerebral ischemia: Secondary | ICD-10-CM

## 2023-11-08 MED ORDER — METHYLPREDNISOLONE ACETATE 80 MG/ML IJ SUSP
80.0000 mg | Freq: Once | INTRAMUSCULAR | Status: AC
  Administered 2023-11-08: 80 mg via INTRAMUSCULAR

## 2023-11-08 MED ORDER — NURTEC 75 MG PO TBDP: ORAL_TABLET | ORAL | 2 refills | Status: DC

## 2023-11-08 NOTE — Patient Instructions (Addendum)
 Let me know if there are cost issues.   Give [place] 2-3 business days to get the results of your labs back.   Stay hydrated.   Let Dr. Carlis Abbott know you did well with trigger point injections.   Let us know if you need anything.

## 2023-11-08 NOTE — Addendum Note (Signed)
 Addended by: Cheron Every C on: 11/08/2023 02:09 PM   Modules accepted: Orders

## 2023-11-08 NOTE — Progress Notes (Signed)
 Chief Complaint  Patient presents with   Acute Visit    Patient presents today for a headaches     Subjective: Patient is a 78 y.o. female here for an acute headache.  Hx of migraines. Used to be on trigger point injections but they seemed to be less efficacious 4 yrs ago. Has 8 d/month with a headache. Triggers include weather changes, allergies, smells, stress, sleep imbalance. She has associated vision changes, N/V, light sensitivity. Failed Imitrex and Zomig. Has chronic ischemic changes in brain based off of scan done in ER.   Past Medical History:  Diagnosis Date   Anemia    Arachnoiditis    Bipolar disorder (HCC)    Cataract    removed   Chronic post-thoracotomy pain    CKD (chronic kidney disease)    GERD (gastroesophageal reflux disease)    Hypogammaglobulinemia (HCC)    Hypotension    Hypothyroid    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HCC)    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HCC)    Low blood pressure    Lung cancer (HCC) 2011, 2014   Memory loss    Migraine    Opioid abuse (HCC)    Osteoporosis    PMR (polymyalgia rheumatica) (HCC) 03/17/2022   Presence of neurostimulator    Restless legs    Sleep apnea     Objective: BP 116/62   Pulse 65   Temp (!) 97.2 F (36.2 C)   Ht 5\' 4"  (1.626 m)   SpO2 95%   BMI 23.86 kg/m  General: Awake, appears stated age Neuro: No cerebellar signs MSK: Mild ttp over b/l traps, no subocc triangle, temporalis, TMJ ttp.  Lungs: CTAB, no rales, wheezes or rhonchi. No accessory muscle use Psych: Age appropriate judgment and insight, normal affect and mood  Assessment and Plan: Migraine without aura and without status migrainosus, not intractable - Plan: Rimegepant Sulfate (NURTEC) 75 MG TBDP  Chronic cerebral ischemia  Chronic, unstable. Depomedrol 80 mg IM today.  Failed Imitrex and Zomig. Has chronic ischemic changes in brain based off of scan done in ER. Start Nurtec 75 mg every day prn. Send message if cost issues. Elevated  LFT's w specialist. Will reck. If abn, will repeat RUQ [place].   The patient voiced understanding and agreement to the plan.  Jilda Roche [place], DO 11/08/23  1:38 PM

## 2023-11-09 ENCOUNTER — Other Ambulatory Visit: Payer: Self-pay | Admitting: Family Medicine

## 2023-11-09 ENCOUNTER — Encounter: Payer: Self-pay | Admitting: Family Medicine

## 2023-11-09 DIAGNOSIS — F332 Major depressive disorder, recurrent severe without psychotic features: Secondary | ICD-10-CM | POA: Diagnosis not present

## 2023-11-09 DIAGNOSIS — G3184 Mild cognitive impairment, so stated: Secondary | ICD-10-CM | POA: Diagnosis not present

## 2023-11-09 DIAGNOSIS — F4323 Adjustment disorder with mixed anxiety and depressed mood: Secondary | ICD-10-CM | POA: Diagnosis not present

## 2023-11-09 DIAGNOSIS — R7401 Elevation of levels of liver transaminase levels: Secondary | ICD-10-CM

## 2023-11-09 LAB — HEPATIC FUNCTION PANEL
ALT: 44 U/L — ABNORMAL HIGH (ref 0–35)
AST: 48 U/L — ABNORMAL HIGH (ref 0–37)
Albumin: 3.4 g/dL — ABNORMAL LOW (ref 3.5–5.2)
Alkaline Phosphatase: 64 U/L (ref 39–117)
Bilirubin, Direct: 0.1 mg/dL (ref 0.0–0.3)
Total Bilirubin: 0.3 mg/dL (ref 0.2–1.2)
Total Protein: 7.8 g/dL (ref 6.0–8.3)

## 2023-11-13 ENCOUNTER — Ambulatory Visit (HOSPITAL_BASED_OUTPATIENT_CLINIC_OR_DEPARTMENT_OTHER)
Admission: RE | Admit: 2023-11-13 | Discharge: 2023-11-13 | Disposition: A | Discharge status: Home / Self Care | Source: Ambulatory Visit | Attending: Family Medicine | Admitting: Family Medicine

## 2023-11-13 DIAGNOSIS — R7401 Elevation of levels of liver transaminase levels: Secondary | ICD-10-CM | POA: Insufficient documentation

## 2023-11-13 DIAGNOSIS — Z9049 Acquired absence of other specified parts of digestive tract: Secondary | ICD-10-CM | POA: Diagnosis not present

## 2023-11-13 DIAGNOSIS — K838 Other specified diseases of biliary tract: Secondary | ICD-10-CM | POA: Diagnosis not present

## 2023-11-13 DIAGNOSIS — R7989 Other specified abnormal findings of blood chemistry: Secondary | ICD-10-CM | POA: Diagnosis not present

## 2023-11-15 ENCOUNTER — Encounter: Payer: Self-pay | Admitting: Family Medicine

## 2023-11-15 ENCOUNTER — Other Ambulatory Visit: Payer: Self-pay | Admitting: Family Medicine

## 2023-11-15 DIAGNOSIS — R7401 Elevation of levels of liver transaminase levels: Secondary | ICD-10-CM

## 2023-11-15 DIAGNOSIS — K838 Other specified diseases of biliary tract: Secondary | ICD-10-CM

## 2023-11-16 ENCOUNTER — Telehealth: Payer: Self-pay

## 2023-11-16 NOTE — Telephone Encounter (Signed)
 Can you check to see if Nurtec needs a PA please?

## 2023-11-16 NOTE — Telephone Encounter (Signed)
 Copied from CRM [id number]. Topic: Clinical - Medication Question >> Nov 16, 2023  9:08 AM Deaijah H wrote: Reason for CRM: Patient called in wanting to speak with Dr. Carmelia Roller or his nurse would like to know how much longer does she have to wait on migraine medication that Dr. Carmelia Roller ordered last week. Please call [phone] to confirm

## 2023-11-17 ENCOUNTER — Other Ambulatory Visit (HOSPITAL_COMMUNITY): Payer: Self-pay

## 2023-11-17 ENCOUNTER — Telehealth: Payer: Self-pay | Admitting: Pharmacy Technician

## 2023-11-17 DIAGNOSIS — Z7409 Other reduced mobility: Secondary | ICD-10-CM | POA: Diagnosis not present

## 2023-11-17 DIAGNOSIS — G259 Extrapyramidal and movement disorder, unspecified: Secondary | ICD-10-CM | POA: Diagnosis not present

## 2023-11-17 DIAGNOSIS — F444 Conversion disorder with motor symptom or deficit: Secondary | ICD-10-CM | POA: Diagnosis not present

## 2023-11-17 DIAGNOSIS — R278 Other lack of coordination: Secondary | ICD-10-CM | POA: Diagnosis not present

## 2023-11-17 NOTE — Telephone Encounter (Signed)
 Pharmacy Patient Advocate Encounter  Received notification from [employer] that Prior Authorization for NURTEC 75MG  DISPERSIBLE TABLETS has been DENIED.  Full denial letter will be uploaded to the media tab. See denial reason below.      PA #/Case ID/Reference #: [id number]

## 2023-11-17 NOTE — Telephone Encounter (Signed)
 Noted.

## 2023-11-17 NOTE — Telephone Encounter (Signed)
 Pharmacy Patient Advocate Encounter   Received notification from CoverMyMeds that prior authorization for Nurtec 75MG  dispersible tablets is required/requested.   Insurance verification completed.   The patient is insured through [employer] .   Per test claim: [id number]  SUBMITTED AND PENDING

## 2023-11-17 NOTE — Telephone Encounter (Signed)
 PA request has been Submitted. New Encounter has been or will be created for follow up. For additional info see Pharmacy Prior Auth telephone encounter from 11/17/23.

## 2023-11-18 ENCOUNTER — Other Ambulatory Visit: Payer: Self-pay | Admitting: Family Medicine

## 2023-11-18 MED ORDER — UBRELVY 100 MG PO TABS: ORAL_TABLET | ORAL | 2 refills | Status: DC

## 2023-11-18 NOTE — Telephone Encounter (Signed)
 Could you send in alternative  Ubrevly      Copied from CRM [id number]. Topic: Clinical - Prescription Issue >> Nov 18, 2023  9:31 AM Kathryne Eriksson wrote: Reason for CRM: Rimegepant Sulfate (NURTEC) 75 MG TBDP >> Nov 18, 2023  9:32 AM Kathryne Eriksson wrote: Patient called in stating that her insurance is requiring for her to have a prior authorization in regards to receiving this medication. Patient states she's in urgent need of her medication therefor she's wanting to get this taken care of quickly.

## 2023-11-22 ENCOUNTER — Other Ambulatory Visit: Payer: Self-pay | Admitting: Family Medicine

## 2023-11-22 DIAGNOSIS — I951 Orthostatic hypotension: Secondary | ICD-10-CM

## 2023-11-24 ENCOUNTER — Other Ambulatory Visit (HOSPITAL_COMMUNITY): Payer: Self-pay

## 2023-11-29 ENCOUNTER — Other Ambulatory Visit: Payer: Self-pay

## 2023-11-29 ENCOUNTER — Emergency Department (HOSPITAL_COMMUNITY)

## 2023-11-29 ENCOUNTER — Encounter: Payer: Self-pay | Admitting: Physical Medicine and Rehabilitation

## 2023-11-29 ENCOUNTER — Encounter (HOSPITAL_COMMUNITY): Payer: Self-pay

## 2023-11-29 ENCOUNTER — Inpatient Hospital Stay (HOSPITAL_COMMUNITY)
Admission: EM | Admit: 2023-11-29 | Discharge: 2023-12-02 | DRG: 690 | Disposition: A | Discharge status: Home / Self Care | Source: Ambulatory Visit | Attending: Internal Medicine | Admitting: Internal Medicine

## 2023-11-29 DIAGNOSIS — Z8249 Family history of ischemic heart disease and other diseases of the circulatory system: Secondary | ICD-10-CM

## 2023-11-29 DIAGNOSIS — K219 Gastro-esophageal reflux disease without esophagitis: Secondary | ICD-10-CM | POA: Diagnosis present

## 2023-11-29 DIAGNOSIS — D803 Selective deficiency of immunoglobulin G [IgG] subclasses: Secondary | ICD-10-CM | POA: Diagnosis present

## 2023-11-29 DIAGNOSIS — N3281 Overactive bladder: Secondary | ICD-10-CM | POA: Diagnosis present

## 2023-11-29 DIAGNOSIS — Z7983 Long term (current) use of bisphosphonates: Secondary | ICD-10-CM | POA: Diagnosis not present

## 2023-11-29 DIAGNOSIS — Z79899 Other long term (current) drug therapy: Secondary | ICD-10-CM

## 2023-11-29 DIAGNOSIS — Z90710 Acquired absence of both cervix and uterus: Secondary | ICD-10-CM | POA: Diagnosis not present

## 2023-11-29 DIAGNOSIS — M542 Cervicalgia: Secondary | ICD-10-CM | POA: Diagnosis not present

## 2023-11-29 DIAGNOSIS — E039 Hypothyroidism, unspecified: Secondary | ICD-10-CM | POA: Diagnosis present

## 2023-11-29 DIAGNOSIS — I5032 Chronic diastolic (congestive) heart failure: Secondary | ICD-10-CM | POA: Diagnosis present

## 2023-11-29 DIAGNOSIS — M353 Polymyalgia rheumatica: Secondary | ICD-10-CM | POA: Diagnosis present

## 2023-11-29 DIAGNOSIS — N390 Urinary tract infection, site not specified: Principal | ICD-10-CM | POA: Diagnosis present

## 2023-11-29 DIAGNOSIS — K561 Intussusception: Secondary | ICD-10-CM | POA: Diagnosis present

## 2023-11-29 DIAGNOSIS — I447 Left bundle-branch block, unspecified: Secondary | ICD-10-CM | POA: Diagnosis not present

## 2023-11-29 DIAGNOSIS — G629 Polyneuropathy, unspecified: Secondary | ICD-10-CM | POA: Diagnosis present

## 2023-11-29 DIAGNOSIS — N1831 Chronic kidney disease, stage 3a: Secondary | ICD-10-CM | POA: Diagnosis present

## 2023-11-29 DIAGNOSIS — Z79890 Hormone replacement therapy (postmenopausal): Secondary | ICD-10-CM | POA: Diagnosis not present

## 2023-11-29 DIAGNOSIS — Z881 Allergy status to other antibiotic agents status: Secondary | ICD-10-CM

## 2023-11-29 DIAGNOSIS — D6959 Other secondary thrombocytopenia: Secondary | ICD-10-CM | POA: Diagnosis present

## 2023-11-29 DIAGNOSIS — Z825 Family history of asthma and other chronic lower respiratory diseases: Secondary | ICD-10-CM

## 2023-11-29 DIAGNOSIS — R000 Tachycardia, unspecified: Secondary | ICD-10-CM | POA: Diagnosis not present

## 2023-11-29 DIAGNOSIS — I959 Hypotension, unspecified: Secondary | ICD-10-CM | POA: Diagnosis not present

## 2023-11-29 DIAGNOSIS — I9589 Other hypotension: Secondary | ICD-10-CM | POA: Diagnosis present

## 2023-11-29 DIAGNOSIS — F319 Bipolar disorder, unspecified: Secondary | ICD-10-CM | POA: Diagnosis present

## 2023-11-29 DIAGNOSIS — E876 Hypokalemia: Secondary | ICD-10-CM | POA: Diagnosis present

## 2023-11-29 DIAGNOSIS — Z85118 Personal history of other malignant neoplasm of bronchus and lung: Secondary | ICD-10-CM

## 2023-11-29 DIAGNOSIS — Z8619 Personal history of other infectious and parasitic diseases: Secondary | ICD-10-CM

## 2023-11-29 DIAGNOSIS — Z7960 Long term (current) use of unspecified immunomodulators and immunosuppressants: Secondary | ICD-10-CM

## 2023-11-29 DIAGNOSIS — Z9682 Presence of neurostimulator: Secondary | ICD-10-CM

## 2023-11-29 DIAGNOSIS — Z8661 Personal history of infections of the central nervous system: Secondary | ICD-10-CM

## 2023-11-29 DIAGNOSIS — Z66 Do not resuscitate: Secondary | ICD-10-CM | POA: Diagnosis not present

## 2023-11-29 DIAGNOSIS — Z7952 Long term (current) use of systemic steroids: Secondary | ICD-10-CM | POA: Diagnosis not present

## 2023-11-29 DIAGNOSIS — Z9842 Cataract extraction status, left eye: Secondary | ICD-10-CM

## 2023-11-29 DIAGNOSIS — M62838 Other muscle spasm: Secondary | ICD-10-CM | POA: Diagnosis not present

## 2023-11-29 DIAGNOSIS — Z8262 Family history of osteoporosis: Secondary | ICD-10-CM

## 2023-11-29 DIAGNOSIS — R0781 Pleurodynia: Secondary | ICD-10-CM | POA: Diagnosis not present

## 2023-11-29 DIAGNOSIS — Z818 Family history of other mental and behavioral disorders: Secondary | ICD-10-CM

## 2023-11-29 DIAGNOSIS — G2581 Restless legs syndrome: Secondary | ICD-10-CM | POA: Diagnosis present

## 2023-11-29 DIAGNOSIS — R0602 Shortness of breath: Secondary | ICD-10-CM | POA: Diagnosis not present

## 2023-11-29 DIAGNOSIS — Z9884 Bariatric surgery status: Secondary | ICD-10-CM

## 2023-11-29 DIAGNOSIS — R42 Dizziness and giddiness: Secondary | ICD-10-CM | POA: Diagnosis not present

## 2023-11-29 DIAGNOSIS — Z9841 Cataract extraction status, right eye: Secondary | ICD-10-CM

## 2023-11-29 DIAGNOSIS — M810 Age-related osteoporosis without current pathological fracture: Secondary | ICD-10-CM | POA: Diagnosis present

## 2023-11-29 DIAGNOSIS — R8271 Bacteriuria: Secondary | ICD-10-CM | POA: Diagnosis not present

## 2023-11-29 DIAGNOSIS — Z91048 Other nonmedicinal substance allergy status: Secondary | ICD-10-CM

## 2023-11-29 DIAGNOSIS — Z87440 Personal history of urinary (tract) infections: Secondary | ICD-10-CM

## 2023-11-29 DIAGNOSIS — R1084 Generalized abdominal pain: Secondary | ICD-10-CM | POA: Diagnosis not present

## 2023-11-29 DIAGNOSIS — R109 Unspecified abdominal pain: Secondary | ICD-10-CM | POA: Diagnosis not present

## 2023-11-29 DIAGNOSIS — N3020 Other chronic cystitis without hematuria: Secondary | ICD-10-CM | POA: Diagnosis not present

## 2023-11-29 DIAGNOSIS — Z888 Allergy status to other drugs, medicaments and biological substances status: Secondary | ICD-10-CM

## 2023-11-29 DIAGNOSIS — I5A Non-ischemic myocardial injury (non-traumatic): Secondary | ICD-10-CM | POA: Diagnosis present

## 2023-11-29 DIAGNOSIS — Z8261 Family history of arthritis: Secondary | ICD-10-CM

## 2023-11-29 DIAGNOSIS — R918 Other nonspecific abnormal finding of lung field: Secondary | ICD-10-CM | POA: Diagnosis not present

## 2023-11-29 DIAGNOSIS — Z820 Family history of epilepsy and other diseases of the nervous system: Secondary | ICD-10-CM

## 2023-11-29 LAB — CBC WITH DIFFERENTIAL/PLATELET
Abs Immature Granulocytes: 0.02 10*3/uL (ref 0.00–0.07)
Basophils Absolute: 0.1 10*3/uL (ref 0.0–0.1)
Basophils Relative: 1 %
Eosinophils Absolute: 0 10*3/uL (ref 0.0–0.5)
Eosinophils Relative: 1 %
HCT: 39.1 % (ref 36.0–46.0)
Hemoglobin: 13 g/dL (ref 12.0–15.0)
Immature Granulocytes: 0 %
Lymphocytes Relative: 33 %
Lymphs Abs: 1.8 10*3/uL (ref 0.7–4.0)
MCH: 30.5 pg (ref 26.0–34.0)
MCHC: 33.2 g/dL (ref 30.0–36.0)
MCV: 91.8 fL (ref 80.0–100.0)
Monocytes Absolute: 0.6 10*3/uL (ref 0.1–1.0)
Monocytes Relative: 12 %
Neutro Abs: 2.9 10*3/uL (ref 1.7–7.7)
Neutrophils Relative %: 53 %
Platelets: 129 10*3/uL — ABNORMAL LOW (ref 150–400)
RBC: 4.26 MIL/uL (ref 3.87–5.11)
RDW: 13.1 % (ref 11.5–15.5)
WBC: 5.5 10*3/uL (ref 4.0–10.5)
nRBC: 0 % (ref 0.0–0.2)

## 2023-11-29 LAB — URINALYSIS, W/ REFLEX TO CULTURE (INFECTION SUSPECTED)
Bilirubin Urine: NEGATIVE
Glucose, UA: NEGATIVE mg/dL
Hgb urine dipstick: NEGATIVE
Ketones, ur: NEGATIVE mg/dL
Nitrite: POSITIVE — AB
Protein, ur: NEGATIVE mg/dL
Specific Gravity, Urine: 1.045 — ABNORMAL HIGH (ref 1.005–1.030)
WBC, UA: >50 WBC/hpf (ref 0–5)
pH: 5 (ref 5.0–8.0)

## 2023-11-29 LAB — COMPREHENSIVE METABOLIC PANEL WITH GFR
ALT: 37 U/L (ref 0–44)
AST: 49 U/L — ABNORMAL HIGH (ref 15–41)
Albumin: 3.1 g/dL — ABNORMAL LOW (ref 3.5–5.0)
Alkaline Phosphatase: 47 U/L (ref 38–126)
Anion gap: 8 (ref 5–15)
BUN: 39 mg/dL — ABNORMAL HIGH (ref 8–23)
CO2: 28 mmol/L (ref 22–32)
Calcium: 9.4 mg/dL (ref 8.9–10.3)
Chloride: 103 mmol/L (ref 98–111)
Creatinine, Ser: 1.71 mg/dL — ABNORMAL HIGH (ref 0.44–1.00)
GFR, Estimated: 31 mL/min — ABNORMAL LOW (ref >60)
Glucose, Bld: 65 mg/dL — ABNORMAL LOW (ref 70–99)
Potassium: 3.5 mmol/L (ref 3.5–5.1)
Sodium: 139 mmol/L (ref 135–145)
Total Bilirubin: 0.5 mg/dL (ref 0.0–1.2)
Total Protein: 7.1 g/dL (ref 6.5–8.1)

## 2023-11-29 LAB — TROPONIN I (HIGH SENSITIVITY)
Troponin I (High Sensitivity): 16 ng/L (ref <18)
Troponin I (High Sensitivity): 29 ng/L — ABNORMAL HIGH (ref <18)

## 2023-11-29 LAB — CBG MONITORING, ED: Glucose-Capillary: 84 mg/dL (ref 70–99)

## 2023-11-29 LAB — BRAIN NATRIURETIC PEPTIDE: B Natriuretic Peptide: 95.6 pg/mL (ref 0.0–100.0)

## 2023-11-29 MED ORDER — MIDODRINE HCL 5 MG PO TABS
10.0000 mg | ORAL_TABLET | Freq: Three times a day (TID) | ORAL | Status: DC
  Administered 2023-11-29 – 2023-12-02 (×9): 10 mg via ORAL
  Filled 2023-11-29 (×9): qty 2

## 2023-11-29 MED ORDER — IOHEXOL 300 MG/ML  SOLN
80.0000 mL | Freq: Once | INTRAMUSCULAR | Status: AC | PRN
  Administered 2023-11-29: 80 mL via INTRAVENOUS

## 2023-11-29 MED ORDER — PIPERACILLIN-TAZOBACTAM 3.375 G IVPB
3.3750 g | Freq: Three times a day (TID) | INTRAVENOUS | Status: DC
  Administered 2023-11-29 – 2023-12-02 (×7): 3.375 g via INTRAVENOUS
  Filled 2023-11-29 (×8): qty 50

## 2023-11-29 MED ORDER — FENTANYL CITRATE PF 50 MCG/ML IJ SOSY
50.0000 ug | PREFILLED_SYRINGE | Freq: Once | INTRAMUSCULAR | Status: AC
  Administered 2023-11-29: 50 ug via INTRAVENOUS
  Filled 2023-11-29: qty 1

## 2023-11-29 MED ORDER — SODIUM CHLORIDE 0.9 % IV BOLUS
500.0000 mL | Freq: Once | INTRAVENOUS | Status: AC
  Administered 2023-11-29: 500 mL via INTRAVENOUS

## 2023-11-29 NOTE — ED Notes (Signed)
 84 ML WAS HER SUGAR LEVEL

## 2023-11-29 NOTE — ED Provider Notes (Signed)
 [place] EMERGENCY DEPARTMENT AT [facility] Provider Note   CSN: [id number] Arrival date & time: 11/29/23  1635     History  Chief Complaint  Patient presents with   Hypotension    Megan Brewer is a 78 y.o. female.  HPI 78 year old female presents with concern for UTI.  For about a week she has been feeling mild symptoms, which she states she often does not feel any symptoms of UTI.  However she has been having a little bit of discomfort with urinating, urinating more often but less amount, and some back/abdominal pain.  She denies fevers but states she never runs a fever.  She feels pain under her ribs bilaterally which she states she has had with UTIs before.  She states she was at the urologist office and they could not get a blood pressure and sent her here.  She does endorse that she had not taken her afternoon dose of midodrine which she was planning on taking when she got home.  She feels like during the same time for the last week she has been having leg swelling in her bilateral lower extremities that is a little worse than typical despite taking her torsemide.  She also feels like her abdomen is distended. She feels short of breath but denies chest pain.  Home Medications Prior to Admission medications   Medication Sig Start Date End Date Taking? Authorizing Provider  Chlorpheniramine-PSE-Ibuprofen (ADVIL ALLERGY SINUS PO) Take 1 tablet by mouth daily.   Yes [provider]  pseudoephedrine (SUDAFED) 120 MG 12 hr tablet Take 120 mg by mouth every 12 (twelve) hours as needed for congestion.   Yes [provider]  tamsulosin (FLOMAX) 0.4 MG CAPS capsule Take 0.4 mg by mouth daily. 11/25/23  Yes [provider]  torsemide (DEMADEX) 20 MG tablet Take 20 mg by mouth 2 (two) times daily.   Yes [provider]  CALCIUM PO Take 1 tablet by mouth in the morning and at bedtime.   Yes [provider]  cholecalciferol  (VITAMIN D3) 25 MCG (1000 UNIT) tablet Take 1,000 Units by mouth daily.   Yes [provider]  cycloSPORINE (RESTASIS) 0.05 % ophthalmic emulsion Place 1 drop into both eyes 4 (four) times daily as needed.   Yes [provider]  denosumab (PROLIA) 60 MG/ML SOSY injection Pt to get at office.  Appt is 08/27/23 07/14/23  Yes Wendling, Jilda Roche, DO  dexlansoprazole (DEXILANT) 60 MG capsule Take 1 capsule (60 mg total) by mouth daily. 07/26/23  Yes Quentin Mulling R, PA-C  diclofenac Sodium (VOLTAREN) 1 % GEL Apply 2 g topically 4 (four) times daily. Patient taking differently: Apply 2 g topically 4 (four) times daily as needed. 07/27/23  Yes Maxwell Marion, PA-C  dicyclomine (BENTYL) 20 MG tablet Take 20 mg by mouth in the morning and at bedtime. 09/04/23  Yes [provider]  famotidine (PEPCID) 20 MG tablet Take 1 tablet (20 mg total) by mouth 2 (two) times daily. 09/20/23  Yes Quentin Mulling R, PA-C  ferrous sulfate 325 (65 FE) MG tablet Take 650 mg by mouth daily with breakfast.   Yes [provider]  fludrocortisone (FLORINEF) 0.1 MG tablet TAKE ONE TABLET BY MOUTH TWICE DAILY 11/22/23  Yes Wendling, Jilda Roche, DO  GAMMAGARD 20 GM/200ML SOLN Inject 40 g into the vein every 21 ( twenty-one) days. INFUSE 40G INTRAVENOUSLY EVERY 3 WEEKS 02/27/22  Yes [provider]  heparin lock flush 100 UNIT/ML  SOLN injection Inject 500 Units into the vein every 21 ( twenty-one) days. 03/31/21  Yes [provider]  KEVZARA 200 MG/1. SOAJ Inject 1.14 mLs into the skin every 14 (fourteen) days. 09/09/22  Yes [provider]  levothyroxine (SYNTHROID) 75 MCG tablet TAKE ONE (1) TABLET BY MOUTH EACH DAY BEFORE BREAKFAST ON EMPTY STOMACH 07/19/23  Yes Jobe Mulder, DO  lidocaine 4 % Place 1 patch onto the skin daily. Patient taking differently: Place 1 patch onto the skin daily as needed. 07/08/23  Yes Carin Charleston, MD  magnesium  oxide (MAG-OX) 400 MG tablet Take 400 mg by mouth daily.    Yes [provider]  methylphenidate (RITALIN) 10 MG tablet Take 1 tablet (10 mg total) by mouth daily. Patient taking differently: Take 10 mg by mouth 2 (two) times daily with breakfast and lunch. 09/15/23 09/14/24 Yes [name], Keven Pel, MD  midodrine (PROAMATINE) 10 MG tablet TAKE ONE (1) TABLET BY MOUTH 3 TIMES DAILY 08/09/23  Yes Jobe Mulder, DO  MYRBETRIQ 50 MG TB24 tablet Take 50 mg by mouth every evening. 10/07/22  Yes [provider]  naloxone (NARCAN) nasal spray 4 mg/0.1 mL Spray up nostril in event of opioid overdose. 06/28/23  Yes Wendling, Shellie Dials, DO  Plecanatide (TRULANCE) 3 MG TABS Take 1 tablet (3 mg total) by mouth daily. 09/20/23 12/19/23 Yes Santina Cull R, PA-C  potassium chloride (KLOR-CON) 10 MEQ tablet Take 1 tablet (10 mEq total) by mouth in the morning, at noon, in the evening, and at bedtime. 11/01/23  Yes Jobe Mulder, DO  pregabalin (LYRICA) 50 MG capsule Take 1 capsule (50 mg total) by mouth 3 (three) times daily. 07/22/23  Yes Jobe Mulder, DO  pyridoxine (B-6) 100 MG tablet Take 100 mg by mouth daily.   Yes Jobe Mulder, DO  sucralfate (CARAFATE) 1 GM/10ML suspension Take 10 mLs (1 g total) by mouth 4 (four) times daily as needed (AB pain, GERD, nausea). Patient not taking: Reported on 11/29/2023 07/26/23   Edmonia Gottron, PA-C  triamcinolone cream (KENALOG) 0.1 % Apply 1 application  topically 2 (two) times daily as needed (rash).   Yes [provider]  Ubrogepant (UBRELVY) 100 MG TABS Take 1 tab as needed for migraines. May repeat in 2 hours if no improvement. No more than 200 mg in a 24 hour period. 11/18/23   Jobe Mulder, DO  vitamin B-12 (CYANOCOBALAMIN) 1000 MCG tablet Take 1,000 mcg by mouth daily. 02/14/21  Yes [provider]  Vitamin D, Ergocalciferol, (DRISDOL) 1.25 MG (50000 UNIT) CAPS capsule TAKE 1 CAPSULE  ONCE WEEKLY 01/04/23  Yes [name], Keven Pel, MD  ZINC OXIDE, TOPICAL, 10 % CREA Apply 1 application topically 2 (two) times daily as needed (rash). 02/14/21  Yes Audria Leather, MD      Allergies    Butorphanol, Erythromycin base, Propoxyphene, Sumatriptan, Tetracycline, Ciprofloxacin, Corticosteroids, Doxycycline, Cefixime, Isometheptene-dichloral-apap, Ketorolac, Prednisone, Promethazine, and Tape    Review of Systems   Review of Systems  Constitutional:  Negative for fever.  Respiratory:  Positive for shortness of breath.   Cardiovascular:  Negative for chest pain.  Gastrointestinal:  Positive for abdominal pain. Negative for vomiting.  Genitourinary:  Positive for dysuria, flank pain, frequency and urgency.  Musculoskeletal:  Positive for back pain.    Physical Exam Updated Vital Signs BP 110/61 (BP Location: Right Arm)   Pulse 69   Temp 97.9 F (36.6 C) (Oral)   Resp  18   Ht 5\' 4"  (1.626 m)   Wt 61.7 kg   SpO2 100%   BMI 23.34 kg/m  Physical Exam Vitals and nursing note reviewed.  Constitutional:      Appearance: She is well-developed.  HENT:     Head: Normocephalic and atraumatic.  Cardiovascular:     Rate and Rhythm: Normal rate and regular rhythm.     Heart sounds: Normal heart sounds.  Pulmonary:     Effort: Pulmonary effort is normal.     Breath sounds: Normal breath sounds.  Abdominal:     Palpations: Abdomen is soft.     Tenderness: There is abdominal tenderness in the right upper quadrant and left upper quadrant. There is right CVA tenderness and left CVA tenderness.  Musculoskeletal:     Comments: Nonpitting swelling to lower legs and feet bilaterally  Skin:    General: Skin is warm and dry.  Neurological:     Mental Status: She is alert.     ED Results / Procedures / Treatments   Labs (all labs ordered are listed, but only abnormal results are displayed) Labs Reviewed  URINALYSIS, W/ REFLEX TO CULTURE (INFECTION SUSPECTED) - Abnormal; Notable for  the following components:      Result Value   APPearance HAZY (*)    Specific Gravity, Urine 1.045 (*)    Nitrite POSITIVE (*)    Leukocytes,Ua LARGE (*)    Bacteria, UA MANY (*)    All other components within normal limits  COMPREHENSIVE METABOLIC PANEL WITH GFR - Abnormal; Notable for the following components:   Glucose, Bld 65 (*)    BUN 39 (*)    Creatinine, Ser 1.71 (*)    Albumin 3.1 (*)    AST 49 (*)    GFR, Estimated 31 (*)    All other components within normal limits  CBC WITH DIFFERENTIAL/PLATELET - Abnormal; Notable for the following components:   Platelets 129 (*)    All other components within normal limits  TROPONIN I (HIGH SENSITIVITY) - Abnormal; Notable for the following components:   Troponin I (High Sensitivity) 29 (*)    All other components within normal limits  URINE CULTURE  BRAIN NATRIURETIC PEPTIDE  CBG MONITORING, ED  TROPONIN I (HIGH SENSITIVITY)  TROPONIN I (HIGH SENSITIVITY)    EKG EKG Interpretation Date/Time:  Monday November 29 2023 22:00:03 EDT Ventricular Rate:  71 PR Interval:  174 QRS Duration:  142 QT Interval:  484 QTC Calculation: 527 R Axis:   30  Text Interpretation: Sinus rhythm IVCD, consider atypical LBBB no significant change since earlier in the day Confirmed by Jerilynn Montenegro [phone]) on 11/29/2023 10:07:31 PM  Radiology CT ABDOMEN PELVIS W CONTRAST Result Date: 11/29/2023 CLINICAL DATA:  Acute abdominal pain and hypotension EXAM: CT ABDOMEN AND PELVIS WITH CONTRAST TECHNIQUE: Multidetector CT imaging of the abdomen and pelvis was performed using the standard protocol following bolus administration of intravenous contrast. RADIATION DOSE REDUCTION: This exam was performed according to the departmental dose-optimization program which includes automated exposure control, adjustment of the mA and/or kV according to patient size and/or use of iterative reconstruction technique. CONTRAST:  80mL OMNIPAQUE IOHEXOL 300 MG/ML  SOLN  COMPARISON:  07/08/2023 FINDINGS: Lower chest: Stable scarring in the right lower lobe is seen. Visualized lung base on the left is unremarkable. Hepatobiliary: Gallbladder has been surgically removed. Biliary ductal dilatation is noted consistent with the post cholecystectomy state. The liver is otherwise within normal limits. Pancreas: Unremarkable. No pancreatic ductal dilatation  or surrounding inflammatory changes. Spleen: Normal in size without focal abnormality. Adrenals/Urinary Tract: Adrenal glands are within normal limits. Kidneys demonstrate a normal enhancement pattern bilaterally. No obstructive changes are seen. The bladder is within normal limits. Stomach/Bowel: No obstructive or inflammatory changes of the colon are seen. The appendix is not well visualized and may have been surgically removed. Small bowel shows no obstructive change. A transient appearing intussusception is noted in the left abdomen near a small bowel anastomosis site likely related to the prior gastric bypass. Postsurgical changes in the stomach are noted. A portion of the stomach extends into the chest and is stable. Vascular/Lymphatic: Aortic atherosclerosis. No enlarged abdominal or pelvic lymph nodes. Reproductive: Status post hysterectomy. No adnexal masses. Other: No abdominal wall hernia or abnormality. No abdominopelvic ascites. Musculoskeletal: InterStim device is noted on the left. Spinal stimulator is noted. No acute bony abnormality is seen. IMPRESSION: Postsurgical changes consistent with the given clinical history. Likely transient intussusception in the small bowel of the left abdomen. No complicating factors are identified in this is likely related to the prior small bowel anastomosis. No other focal abnormality is noted. Electronically Signed   By: Violeta Grey M.D.   On: 11/29/2023 21:44   DG Chest Portable 1 View Result Date: 11/29/2023 CLINICAL DATA:  Shortness of breath, rib pain EXAM: PORTABLE CHEST 1 VIEW  COMPARISON:  05/20/2023 FINDINGS: Unchanged distribution of dorsal column stimulator leads. Power injectable Port-A-Cath tip: Cavoatrial junction. Heart size within normal limits. The patient is rotated to the right on today's radiograph, reducing diagnostic sensitivity and specificity. Right lower lobectomy with chronic retrocardiac opacity on the right, and old right rib deformities. IMPRESSION: 1. No acute findings. 2. Right lower lobectomy with chronic retrocardiac opacity on the right. 3. Old right rib deformities. Electronically Signed   By: Freida Jes M.D.   On: 11/29/2023 19:53    Procedures Procedures    Medications Ordered in ED Medications  midodrine (PROAMATINE) tablet 10 mg (10 mg Oral Given 11/29/23 1758)  piperacillin-tazobactam (ZOSYN) IVPB 3.375 g (has no administration in time range)  fentaNYL (SUBLIMAZE) injection 50 mcg (50 mcg Intravenous Given 11/29/23 1757)  sodium chloride 0.9 % bolus 500 mL (500 mLs Intravenous New Bag/Given 11/29/23 2030)  iohexol (OMNIPAQUE) 300 MG/ML solution 80 mL (80 mLs Intravenous Contrast Given 11/29/23 2019)    ED Course/ Medical Decision Making/ A&P                                 Medical Decision Making Amount and/or Complexity of Data Reviewed Labs: ordered.    Details: UTI.  Troponin mildly increased to 29. Radiology: ordered and independent interpretation performed.    Details: No pneumonia ECG/medicine tests: ordered and independent interpretation performed.    Details: Nonspecific changes, similar to baseline.  Risk Prescription drug management. Decision regarding hospitalization.   Patient presents with a chief complaint of UTI symptoms.  Urine is concerning for UTI and will be sent for culture.  Otherwise, she appears to have a mild troponin leak.  She did have some shortness of breath but denied chest pain.  Unclear if this is from the hypotension in the urology office.  She will need repeat troponins to ensure this is  not continuing to rise but she states her dyspnea has improved.  She has had complicated UTIs in the past, most recently E. coli and Klebsiella last year.  Thus she will be given  Zosyn per sensitivities from that culture.  She has tolerated penicillins in the past.  I think she will need admission, discussed with Dr. Amy Kansky.        Final Clinical Impression(s) / ED Diagnoses Final diagnoses:  Acute urinary tract infection    Rx / DC Orders ED Discharge Orders     None         Jerilynn Montenegro, MD 11/29/23 2321

## 2023-11-29 NOTE — Telephone Encounter (Signed)
 Copied from CRM [id number]. Topic: Clinical - Prescription Issue >> Nov 29, 2023  8:43 AM Georgeann Kindred wrote: Reason for CRM: Patient received a denial letter from [facility] regarding the Nurtec medication stating that she has to take the Ubrogepant (UBRELVY) before she qualifies to take the Nurtec. Patient would like to know next steps moving forward. Please contact patient at [phone]. Alt number: [phone] if number is restricted.

## 2023-11-29 NOTE — ED Triage Notes (Addendum)
 Patient BIB [facility] from [facility] due to hypotension 91/49 and bilateral flank pain. Feeling dizzy. Alert and oriented x4. Feels pain at the base of her ribs. Slight burning urination.

## 2023-11-30 ENCOUNTER — Encounter: Payer: Medicare PPO | Admitting: Physical Medicine and Rehabilitation

## 2023-11-30 ENCOUNTER — Other Ambulatory Visit: Payer: Self-pay | Admitting: Family Medicine

## 2023-11-30 DIAGNOSIS — E876 Hypokalemia: Secondary | ICD-10-CM | POA: Diagnosis present

## 2023-11-30 DIAGNOSIS — I5032 Chronic diastolic (congestive) heart failure: Secondary | ICD-10-CM | POA: Diagnosis present

## 2023-11-30 DIAGNOSIS — Z66 Do not resuscitate: Secondary | ICD-10-CM | POA: Diagnosis not present

## 2023-11-30 DIAGNOSIS — K561 Intussusception: Secondary | ICD-10-CM | POA: Diagnosis present

## 2023-11-30 DIAGNOSIS — I959 Hypotension, unspecified: Secondary | ICD-10-CM | POA: Diagnosis not present

## 2023-11-30 DIAGNOSIS — Z79890 Hormone replacement therapy (postmenopausal): Secondary | ICD-10-CM | POA: Diagnosis not present

## 2023-11-30 DIAGNOSIS — Z9884 Bariatric surgery status: Secondary | ICD-10-CM | POA: Diagnosis not present

## 2023-11-30 DIAGNOSIS — Z7983 Long term (current) use of bisphosphonates: Secondary | ICD-10-CM | POA: Diagnosis not present

## 2023-11-30 DIAGNOSIS — Z79899 Other long term (current) drug therapy: Secondary | ICD-10-CM | POA: Diagnosis not present

## 2023-11-30 DIAGNOSIS — I5A Non-ischemic myocardial injury (non-traumatic): Secondary | ICD-10-CM | POA: Diagnosis present

## 2023-11-30 DIAGNOSIS — I9589 Other hypotension: Secondary | ICD-10-CM | POA: Diagnosis present

## 2023-11-30 DIAGNOSIS — K219 Gastro-esophageal reflux disease without esophagitis: Secondary | ICD-10-CM | POA: Diagnosis present

## 2023-11-30 DIAGNOSIS — D803 Selective deficiency of immunoglobulin G [IgG] subclasses: Secondary | ICD-10-CM | POA: Diagnosis present

## 2023-11-30 DIAGNOSIS — Z7952 Long term (current) use of systemic steroids: Secondary | ICD-10-CM | POA: Diagnosis not present

## 2023-11-30 DIAGNOSIS — M810 Age-related osteoporosis without current pathological fracture: Secondary | ICD-10-CM | POA: Diagnosis present

## 2023-11-30 DIAGNOSIS — Z7960 Long term (current) use of unspecified immunomodulators and immunosuppressants: Secondary | ICD-10-CM | POA: Diagnosis not present

## 2023-11-30 DIAGNOSIS — N390 Urinary tract infection, site not specified: Secondary | ICD-10-CM | POA: Diagnosis present

## 2023-11-30 DIAGNOSIS — E039 Hypothyroidism, unspecified: Secondary | ICD-10-CM | POA: Diagnosis present

## 2023-11-30 DIAGNOSIS — N3281 Overactive bladder: Secondary | ICD-10-CM | POA: Diagnosis present

## 2023-11-30 DIAGNOSIS — G2581 Restless legs syndrome: Secondary | ICD-10-CM | POA: Diagnosis present

## 2023-11-30 DIAGNOSIS — N1831 Chronic kidney disease, stage 3a: Secondary | ICD-10-CM | POA: Diagnosis present

## 2023-11-30 DIAGNOSIS — G629 Polyneuropathy, unspecified: Secondary | ICD-10-CM | POA: Diagnosis present

## 2023-11-30 DIAGNOSIS — F319 Bipolar disorder, unspecified: Secondary | ICD-10-CM | POA: Diagnosis present

## 2023-11-30 DIAGNOSIS — D6959 Other secondary thrombocytopenia: Secondary | ICD-10-CM | POA: Diagnosis present

## 2023-11-30 DIAGNOSIS — M353 Polymyalgia rheumatica: Secondary | ICD-10-CM | POA: Diagnosis present

## 2023-11-30 LAB — CBC
HCT: 35.5 % — ABNORMAL LOW (ref 36.0–46.0)
Hemoglobin: 11.9 g/dL — ABNORMAL LOW (ref 12.0–15.0)
MCH: 31.1 pg (ref 26.0–34.0)
MCHC: 33.5 g/dL (ref 30.0–36.0)
MCV: 92.7 fL (ref 80.0–100.0)
Platelets: 113 10*3/uL — ABNORMAL LOW (ref 150–400)
RBC: 3.83 MIL/uL — ABNORMAL LOW (ref 3.87–5.11)
RDW: 13.1 % (ref 11.5–15.5)
WBC: 4.8 10*3/uL (ref 4.0–10.5)
nRBC: 0 % (ref 0.0–0.2)

## 2023-11-30 LAB — LIPID PANEL
Cholesterol: 135 mg/dL (ref 0–200)
HDL: 55 mg/dL (ref >40)
LDL Cholesterol: 59 mg/dL (ref 0–99)
Total CHOL/HDL Ratio: 2.5 ratio
Triglycerides: 104 mg/dL (ref <150)
VLDL: 21 mg/dL (ref 0–40)

## 2023-11-30 LAB — BASIC METABOLIC PANEL WITH GFR
Anion gap: 8 (ref 5–15)
BUN: 34 mg/dL — ABNORMAL HIGH (ref 8–23)
CO2: 27 mmol/L (ref 22–32)
Calcium: 8.6 mg/dL — ABNORMAL LOW (ref 8.9–10.3)
Chloride: 106 mmol/L (ref 98–111)
Creatinine, Ser: 1.42 mg/dL — ABNORMAL HIGH (ref 0.44–1.00)
GFR, Estimated: 39 mL/min — ABNORMAL LOW (ref >60)
Glucose, Bld: 65 mg/dL — ABNORMAL LOW (ref 70–99)
Potassium: 3.2 mmol/L — ABNORMAL LOW (ref 3.5–5.1)
Sodium: 141 mmol/L (ref 135–145)

## 2023-11-30 LAB — HEMOGLOBIN A1C
Hgb A1c MFr Bld: 5.3 % (ref 4.8–5.6)
Mean Plasma Glucose: 105 mg/dL

## 2023-11-30 LAB — TSH: TSH: 4.778 u[IU]/mL — ABNORMAL HIGH (ref 0.350–4.500)

## 2023-11-30 LAB — PHOSPHORUS: Phosphorus: 3.6 mg/dL (ref 2.5–4.6)

## 2023-11-30 LAB — MAGNESIUM: Magnesium: 2.4 mg/dL (ref 1.7–2.4)

## 2023-11-30 LAB — TROPONIN I (HIGH SENSITIVITY)
Troponin I (High Sensitivity): 37 ng/L — ABNORMAL HIGH (ref <18)
Troponin I (High Sensitivity): 41 ng/L — ABNORMAL HIGH (ref <18)

## 2023-11-30 MED ORDER — LEVOTHYROXINE SODIUM 75 MCG PO TABS
75.0000 ug | ORAL_TABLET | Freq: Every day | ORAL | Status: DC
  Administered 2023-11-30 – 2023-12-02 (×3): 75 ug via ORAL
  Filled 2023-11-30 (×3): qty 1

## 2023-11-30 MED ORDER — DOCUSATE SODIUM 100 MG PO CAPS
200.0000 mg | ORAL_CAPSULE | Freq: Two times a day (BID) | ORAL | Status: DC
Start: 2023-11-30 — End: 2023-12-02
  Administered 2023-11-30 (×2): 200 mg via ORAL
  Filled 2023-11-30 (×5): qty 2

## 2023-11-30 MED ORDER — FAMOTIDINE 20 MG PO TABS
20.0000 mg | ORAL_TABLET | Freq: Two times a day (BID) | ORAL | Status: DC
  Administered 2023-11-30 – 2023-12-02 (×6): 20 mg via ORAL
  Filled 2023-11-30 (×6): qty 1

## 2023-11-30 MED ORDER — SODIUM CHLORIDE 0.9% FLUSH
3.0000 mL | Freq: Two times a day (BID) | INTRAVENOUS | Status: DC
  Administered 2023-11-30 – 2023-12-02 (×6): 3 mL via INTRAVENOUS

## 2023-11-30 MED ORDER — VANCOMYCIN HCL 125 MG PO CAPS
125.0000 mg | ORAL_CAPSULE | Freq: Every day | ORAL | Status: DC
  Administered 2023-11-30 – 2023-12-02 (×2): 125 mg via ORAL
  Filled 2023-11-30 (×3): qty 1

## 2023-11-30 MED ORDER — PANTOPRAZOLE SODIUM 40 MG PO TBEC
40.0000 mg | DELAYED_RELEASE_TABLET | Freq: Every day | ORAL | Status: DC
  Administered 2023-11-30 – 2023-12-02 (×3): 40 mg via ORAL
  Filled 2023-11-30 (×3): qty 1

## 2023-11-30 MED ORDER — TORSEMIDE 20 MG PO TABS
20.0000 mg | ORAL_TABLET | Freq: Every day | ORAL | Status: DC
  Administered 2023-12-01 – 2023-12-02 (×2): 20 mg via ORAL
  Filled 2023-11-30 (×2): qty 1

## 2023-11-30 MED ORDER — SODIUM CHLORIDE 0.9% FLUSH: 10.0000 mL | INTRAVENOUS | Status: DC | PRN

## 2023-11-30 MED ORDER — FLUDROCORTISONE ACETATE 0.1 MG PO TABS
100.0000 ug | ORAL_TABLET | Freq: Two times a day (BID) | ORAL | Status: DC
  Administered 2023-11-30 – 2023-12-02 (×7): 100 ug via ORAL
  Filled 2023-11-30 (×6): qty 1

## 2023-11-30 MED ORDER — PREGABALIN 50 MG PO CAPS
50.0000 mg | ORAL_CAPSULE | Freq: Three times a day (TID) | ORAL | Status: DC
  Administered 2023-11-30 – 2023-12-02 (×7): 50 mg via ORAL
  Filled 2023-11-30 (×7): qty 1

## 2023-11-30 MED ORDER — DICYCLOMINE HCL 20 MG PO TABS
20.0000 mg | ORAL_TABLET | Freq: Two times a day (BID) | ORAL | Status: DC | PRN
  Administered 2023-11-30 – 2023-12-01 (×2): 20 mg via ORAL
  Filled 2023-11-30 (×2): qty 1

## 2023-11-30 MED ORDER — POLYETHYLENE GLYCOL 3350 17 G PO PACK
17.0000 g | PACK | Freq: Two times a day (BID) | ORAL | Status: DC
  Administered 2023-11-30: 17 g via ORAL
  Filled 2023-11-30 (×4): qty 1

## 2023-11-30 MED ORDER — HEPARIN SODIUM (PORCINE) 5000 UNIT/ML IJ SOLN
5000.0000 [IU] | Freq: Three times a day (TID) | INTRAMUSCULAR | Status: DC
  Administered 2023-11-30 – 2023-12-02 (×7): 5000 [IU] via SUBCUTANEOUS
  Filled 2023-11-30 (×7): qty 1

## 2023-11-30 MED ORDER — MELATONIN 3 MG PO TABS
6.0000 mg | ORAL_TABLET | Freq: Every evening | ORAL | Status: DC | PRN
  Administered 2023-11-30: 6 mg via ORAL
  Filled 2023-11-30 (×3): qty 2

## 2023-11-30 MED ORDER — TORSEMIDE 20 MG PO TABS: 20.0000 mg | ORAL_TABLET | Freq: Two times a day (BID) | ORAL | Status: DC

## 2023-11-30 MED ORDER — POLYETHYLENE GLYCOL 3350 17 G PO PACK
17.0000 g | PACK | Freq: Every day | ORAL | Status: DC | PRN
Start: 2023-11-30 — End: 2023-11-30

## 2023-11-30 MED ORDER — MIDODRINE HCL 5 MG PO TABS: 5.0000 mg | ORAL_TABLET | Freq: Three times a day (TID) | ORAL | Status: DC

## 2023-11-30 MED ORDER — POTASSIUM CHLORIDE CRYS ER 20 MEQ PO TBCR
60.0000 meq | EXTENDED_RELEASE_TABLET | Freq: Once | ORAL | Status: AC
  Administered 2023-11-30: 60 meq via ORAL
  Filled 2023-11-30: qty 3

## 2023-11-30 MED ORDER — CHLORHEXIDINE GLUCONATE CLOTH 2 % EX PADS
6.0000 | MEDICATED_PAD | Freq: Every day | CUTANEOUS | Status: DC
  Administered 2023-11-30 – 2023-12-02 (×3): 6 via TOPICAL

## 2023-11-30 MED ORDER — ONDANSETRON HCL 4 MG/2ML IJ SOLN: 4.0000 mg | Freq: Four times a day (QID) | INTRAMUSCULAR | Status: DC | PRN

## 2023-11-30 MED ORDER — HEPARIN SODIUM (PORCINE) 5000 UNIT/ML IJ SOLN
5000.0000 [IU] | Freq: Three times a day (TID) | INTRAMUSCULAR | Status: DC
Start: 2023-11-30 — End: 2023-11-30

## 2023-11-30 MED ORDER — ACETAMINOPHEN 500 MG PO TABS
1000.0000 mg | ORAL_TABLET | Freq: Four times a day (QID) | ORAL | Status: DC | PRN
  Administered 2023-11-30: 1000 mg via ORAL
  Filled 2023-11-30 (×3): qty 2

## 2023-11-30 MED ORDER — POTASSIUM CHLORIDE 20 MEQ PO PACK
60.0000 meq | PACK | Freq: Once | ORAL | Status: DC
  Filled 2023-11-30: qty 3

## 2023-11-30 NOTE — Progress Notes (Signed)
 Mobility Specialist - Progress Note   11/30/23 1442  Mobility  Activity Ambulated with assistance in hallway  Level of Assistance Contact guard assist, steadying assist  Assistive Device Other (Comment) (HHA & IV Pole)  Distance Ambulated (ft) 500 ft  Activity Response Tolerated well  Mobility Referral Yes  Mobility visit 1 Mobility  Mobility Specialist Start Time (ACUTE ONLY) 1426  Mobility Specialist Stop Time (ACUTE ONLY) 1441  Mobility Specialist Time Calculation (min) (ACUTE ONLY) 15 min   Pt received in bed and agreeable to mobility. No complaints during session. Pt to bed after session with all needs met.    [facility]

## 2023-11-30 NOTE — Plan of Care (Addendum)
 VSS. Patient ambulating to bathroom with standby assist. LBM 4/14. No acute events overnight.  Problem: Education: Goal: Knowledge of General Education information will improve Description: Including pain rating scale, medication(s)/side effects and non-pharmacologic comfort measures Outcome: Progressing   Problem: Clinical Measurements: Goal: Ability to maintain clinical measurements within normal limits will improve Outcome: Progressing Goal: Will remain free from infection Outcome: Progressing   Problem: Activity: Goal: Risk for activity intolerance will decrease Outcome: Progressing   Problem: Elimination: Goal: Will not experience complications related to urinary retention Outcome: Progressing   Problem: Safety: Goal: Ability to remain free from injury will improve Outcome: Progressing   Problem: Urinary Elimination: Goal: Signs and symptoms of infection will decrease Outcome: Progressing

## 2023-11-30 NOTE — Plan of Care (Signed)
  Problem: Education: Goal: Knowledge of General Education information will improve Description: Including pain rating scale, medication(s)/side effects and non-pharmacologic comfort measures Outcome: Progressing   Problem: Health Behavior/Discharge Planning: Goal: Ability to manage health-related needs will improve Outcome: Progressing   Problem: Clinical Measurements: Goal: Ability to maintain clinical measurements within normal limits will improve Outcome: Progressing Goal: Diagnostic test results will improve Outcome: Progressing Goal: Respiratory complications will improve Outcome: Progressing   Problem: Nutrition: Goal: Adequate nutrition will be maintained Outcome: Progressing   Problem: Coping: Goal: Level of anxiety will decrease Outcome: Progressing   Problem: Pain Managment: Goal: General experience of comfort will improve and/or be controlled Outcome: Progressing   Problem: Safety: Goal: Ability to remain free from injury will improve Outcome: Progressing   Problem: Urinary Elimination: Goal: Signs and symptoms of infection will decrease Outcome: Progressing

## 2023-11-30 NOTE — Progress Notes (Signed)
 Same day non-billable progress note   Megan Brewer is a 78 y.o. female with hx of history ESBL UTI, orthostatic hypotension, hypothyroidism, autonomic dysfunction,  CKD 3A, HFpEF, PMR, IgG deficiency, gastric bypass, spinal stimulator, remote lung cancer status post wedge resection, who was referred to the ED by urology due to hypotension noted in office which is now resolved. Admitted  for treatment of UTI.   Patient reports feeling better than when she arrived, but still has some chronic back and abdominal pain she attributes to chronic constipation . No dizziness. Reports low blood pressures are chronic for her, as low as 60's/30's. She denies prior syncopal episodes, even with pressures that low. She asking to resume her diuretic because of lower extremity swelling.   Complicated UTI  History ESBL UTI CT abdomen pelvis with no signs of pyelonephritis. -Continue Zosyn 3375 mg IV every 8 hours pending urine culture -Contact precautions of prior ESBL   Hypotension-resolved History of orthostatic hypotension On EMS arrival BP 90s over 40s and symptomatic.  At time of ED arrival BP had normalized.  Suspect related to chronic hypotension / autonomic dysfunction, possibly hypovolemic.  - Continue oral hydration - Continue midodrine and fludrocortisone - resume torsemide daily (reduced from BID dosing, no hx reduced EF)  - recheck orthostatic vital signs daily  - consider d/c tamsulosin, on hold for now    Acute myocardial injury High-sensitivity troponin with low level elevation 16 -> 29 -> 37.  EKG without acute ischemic changes.  Suspect this is a demand event in the setting of transient hypotension and infection.Patient denies chest pain.  ECHO 05/2023 with G1DD.  - Management directed at blood pressure and UTI per above.     Incidental note of transient appearing intussusception of small bowel Reassuring abd exam with no tenderness, however patient does complain of chronic  constipation and lower abdominal pain.   -Follow clinically if worsening abdominal symptoms would consider repeat scan, general surgery involvement. - bowel regimen    C diff prophylaxis  Hx of recurrent C diff  -Started on vancomycin p.o. daily to continue through abx plus additional week.    Chronic medical problems: Hypothyroidism: Continue home levothyroxine. F/u PCP  CKD stage IIIa: Baseline creatinine approximately 1.6 HFpEF: Without acute exacerbation.  Resume torsemide reduced frequency  History of PMR: On Sarlimumab outpatient IgG deficiency: On IgG outpatient. History of gastric bypass: Noted Spinal stimulator: Noted Remote history lung cancer s/p wedge resection: Outpatient follow-up surveillance GERD: protonix  OAB: Hold her home Myrbetriq inpatient. ?  Neuropathy: Continue home Lyrica.        DVT prophylaxis:  SQ Heparin Regular diet  Monitor/replace electrolytes  No IVF  DNR

## 2023-11-30 NOTE — H&P (Addendum)
 History and Physical    Megan Brewer WUJ:[id number] DOB: 1946-06-15 DOA: 11/29/2023  PCP: Sharlene Dory, DO   Patient coming from: Urology clinic    Chief Complaint:  Chief Complaint  Patient presents with   Hypotension    HPI:  Megan Brewer is a 78 y.o. female with hx of history ESBL UTI, orthostatic hypotension, hypothyroidism, CKD 3A, HFpEF, PMR, IgG deficiency, gastric bypass, spinal stimulator, remote lung cancer status post wedge resection, who was referred to the ED by urology due to hypotension.  At present in with urinary symptoms over the past week.  Reports having hesitancy and urgency with minimal urine output which is consistent with prior UTI symptoms.  No fevers or chills.  At the urologist office they were unable to obtain a blood pressure and she was symptomatic so they called EMS, was 90/40s on EMS arrival.     Review of Systems:  ROS complete and negative except as marked above   Allergies  Allergen Reactions   Butorphanol Hives, Other (See Comments) and Swelling    Cerebral pain, "like someone is brushing my brain with a brillo pad"  Other Reaction(s): Not available   Erythromycin Base Diarrhea, Itching and Other (See Comments)    Severe diarrhea   Propoxyphene Nausea Only and Other (See Comments)    Depression, Agitation, Blurred Vision  Other Reaction(s): Not available   Sumatriptan Nausea And Vomiting, Other (See Comments) and Tinitus    Ringing in the ears, migraines are worse  Other Reaction(s): Not available   Tetracycline Nausea Only, Other (See Comments) and Tinitus    Causes ringing in ears, dizziness, migraine, nausea  Other Reaction(s): Not available   Ciprofloxacin Hives, Other (See Comments) and Tinitus    Headache, dizziness, ringing in the ears  Other Reaction(s): Not available   Corticosteroids Other (See Comments)    12/02/2016 interview by NFA: Oral dosing causes migraines/nausea/tinnitus. Prev tolerated IV  and intranasal admin w/o difficulty.   Doxycycline Other (See Comments)    unknown  Other Reaction(s): Not available   Cefixime Other (See Comments)    Headache  Other Reaction(s): Not available   Isometheptene-Dichloral-Apap Other (See Comments)    Unknown, mood liability   Ketorolac     12/02/2016 interview by OZH: Oral dosing prednisone/corticosteroids causes migraines/nausea/tinnitus. Prev tolerated IV and intranasal admin w/o difficulty.  Other Reaction(s): Not available   Prednisone Other (See Comments)    Migraine, dizzy, ringing in ears-can take it IV  12/02/2016 interview by [name]: Oral prednisone dosing causes migraines/nausea/tinnitus. Prev tolerated IV and intranasal admin w/o difficulty.  Other Reaction(s): Not available   Promethazine Other (See Comments)    causes severe abdominal pain when taken IV; however she can take PO or IM  Other Reaction(s): Not available   Tape Other (See Comments)    Adhesive tapes-patient denies    Prior to Admission medications   Medication Sig Start Date End Date Taking? Authorizing Provider  CALCIUM PO Take 1 tablet by mouth in the morning and at bedtime.   Yes [provider]  Chlorpheniramine-PSE-Ibuprofen (ADVIL ALLERGY SINUS PO) Take 1 tablet by mouth daily.   Yes [provider]  cholecalciferol (VITAMIN D3) 25 MCG (1000 UNIT) tablet Take 1,000 Units by mouth daily.   Yes [provider]  cycloSPORINE (RESTASIS) 0.05 % ophthalmic emulsion Place 1 drop into both eyes 4 (four) times daily as needed.   Yes [provider]  denosumab (PROLIA) 60 MG/ML SOSY injection Pt to  get at office.  Appt is 08/27/23 07/14/23  Yes Wendling, Jilda Roche, DO  dexlansoprazole (DEXILANT) 60 MG capsule Take 1 capsule (60 mg total) by mouth daily. 07/26/23  Yes Quentin Mulling R, PA-C  diclofenac Sodium (VOLTAREN) 1 % GEL Apply 2 g topically 4 (four) times daily. Patient taking differently: Apply 2 g topically 4  (four) times daily as needed. 07/27/23  Yes Maxwell Marion, PA-C  dicyclomine (BENTYL) 20 MG tablet Take 20 mg by mouth in the morning and at bedtime. 09/04/23  Yes [provider]  famotidine (PEPCID) 20 MG tablet Take 1 tablet (20 mg total) by mouth 2 (two) times daily. 09/20/23  Yes Quentin Mulling R, PA-C  ferrous sulfate 325 (65 FE) MG tablet Take 650 mg by mouth daily with breakfast.   Yes [provider]  fludrocortisone (FLORINEF) 0.1 MG tablet TAKE ONE TABLET BY MOUTH TWICE DAILY 11/22/23  Yes Wendling, Jilda Roche, DO  GAMMAGARD 20 GM/200ML SOLN Inject 40 g into the vein every 21 ( twenty-one) days. INFUSE 40G INTRAVENOUSLY EVERY 3 WEEKS 02/27/22  Yes [provider]  heparin lock flush 100 UNIT/ML SOLN injection Inject 500 Units into the vein every 21 ( twenty-one) days. 03/31/21  Yes [provider]  KEVZARA 200 MG/1. SOAJ Inject 1.14 mLs into the skin every 14 (fourteen) days. 09/09/22  Yes [provider]  levothyroxine (SYNTHROID) 75 MCG tablet TAKE ONE (1) TABLET BY MOUTH EACH DAY BEFORE BREAKFAST ON EMPTY STOMACH 07/19/23  Yes Sharlene Dory, DO  lidocaine 4 % Place 1 patch onto the skin daily. Patient taking differently: Place 1 patch onto the skin daily as needed. 07/08/23  Yes Durwin Glaze, MD  magnesium oxide (MAG-OX) 400 MG tablet Take 400 mg by mouth daily.    Yes [provider]  methylphenidate (RITALIN) 10 MG tablet Take 1 tablet (10 mg total) by mouth daily. Patient taking differently: Take 10 mg by mouth 2 (two) times daily with breakfast and lunch. 09/15/23 09/14/24 Yes [name], Drema Pry, MD  midodrine (PROAMATINE) 10 MG tablet TAKE ONE (1) TABLET BY MOUTH 3 TIMES DAILY 08/09/23  Yes Sharlene Dory, DO  MYRBETRIQ 50 MG TB24 tablet Take 50 mg by mouth every evening. 10/07/22  Yes [provider]  naloxone (NARCAN) nasal spray 4 mg/0.1 mL Spray up nostril in event of opioid overdose.  06/28/23  Yes Wendling, Jilda Roche, DO  Plecanatide (TRULANCE) 3 MG TABS Take 1 tablet (3 mg total) by mouth daily. 09/20/23 12/19/23 Yes Quentin Mulling R, PA-C  potassium chloride (KLOR-CON) 10 MEQ tablet Take 1 tablet (10 mEq total) by mouth in the morning, at noon, in the evening, and at bedtime. 11/01/23  Yes Sharlene Dory, DO  pregabalin (LYRICA) 50 MG capsule Take 1 capsule (50 mg total) by mouth 3 (three) times daily. 07/22/23  Yes Sharlene Dory, DO  pseudoephedrine (SUDAFED) 120 MG 12 hr tablet Take 120 mg by mouth every 12 (twelve) hours as needed for congestion.   Yes [provider]  pyridoxine (B-6) 100 MG tablet Take 100 mg by mouth daily.   Yes Sharlene Dory, DO  tamsulosin (FLOMAX) 0.4 MG CAPS capsule Take 0.4 mg by mouth daily. 11/25/23  Yes [provider]  torsemide (DEMADEX) 20 MG tablet Take 20 mg by mouth 2 (two) times daily.   Yes [provider]  triamcinolone cream (KENALOG) 0.1 % Apply 1 application  topically 2 (two) times daily as needed (rash).  Yes [provider]  vitamin B-12 (CYANOCOBALAMIN) 1000 MCG tablet Take 1,000 mcg by mouth daily. 02/14/21  Yes [provider]  Vitamin D, Ergocalciferol, (DRISDOL) 1.25 MG (50000 UNIT) CAPS capsule TAKE 1 CAPSULE ONCE WEEKLY 01/04/23  Yes [name], Drema Pry, MD  ZINC OXIDE, TOPICAL, 10 % CREA Apply 1 application topically 2 (two) times daily as needed (rash). 02/14/21  Yes Glade Lloyd, MD  sucralfate (CARAFATE) 1 GM/10ML suspension Take 10 mLs (1 g total) by mouth 4 (four) times daily as needed (AB pain, GERD, nausea). Patient not taking: Reported on 11/29/2023 07/26/23   Doree Albee, PA-C  Ubrogepant (UBRELVY) 100 MG TABS Take 1 tab as needed for migraines. May repeat in 2 hours if no improvement. No more than 200 mg in a 24 hour period. 11/18/23   Sharlene Dory, DO    Past Medical History:  Diagnosis Date   Anemia    Arachnoiditis    Bipolar  disorder (HCC)    Cataract    removed   Chronic post-thoracotomy pain    CKD (chronic kidney disease)    GERD (gastroesophageal reflux disease)    Hypogammaglobulinemia (HCC)    Hypotension    Hypothyroid    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HCC)    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HCC)    Low blood pressure    Lung cancer (HCC) 2011, 2014   Memory loss    Migraine    Opioid abuse (HCC)    Osteoporosis    PMR (polymyalgia rheumatica) (HCC) 03/17/2022   Presence of neurostimulator    Restless legs    Sleep apnea     Past Surgical History:  Procedure Laterality Date   ABDOMINAL HYSTERECTOMY     CARPAL TUNNEL RELEASE     CATARACT EXTRACTION Bilateral 2019   CHOLECYSTECTOMY     COLONOSCOPY  2015   [facility]   CT LUNG SCREENING  2018   DENTAL SURGERY  06/17/2021   4 teeth rmoved   DG  BONE DENSITY ([facility] HX)  2018   DIAGNOSTIC MAMMOGRAM  2019   ESOPHAGOGASTRODUODENOSCOPY (EGD) WITH PROPOFOL N/A 09/23/2023   Procedure: ESOPHAGOGASTRODUODENOSCOPY (EGD) WITH PROPOFOL;  Surgeon: Benancio Deeds, MD;  Location: [facility];  Service: Gastroenterology;  Laterality: N/A;   GASTRIC BYPASS     LUNG CANCER SURGERY  02/2010   MULTIPLE TOOTH EXTRACTIONS     PORT A CATH REVISION Right 04/2021   PORT-A-CATH REMOVAL Left 02/07/2021   Procedure: REMOVAL PORT-A-CATH;  Surgeon: Kinsinger, De Blanch, MD;  Location: [facility];  Service: General;  Laterality: Left;  60 MINUTES ROOM 4   SAVORY DILATION N/A 09/23/2023   Procedure: SAVORY DILATION;  Surgeon: Benancio Deeds, MD;  Location: [facility];  Service: Gastroenterology;  Laterality: N/A;   SPINAL CORD STIMULATOR IMPLANT       reports that she has never smoked. She has never used smokeless tobacco. She reports that she does not currently use alcohol. She reports that she does not use drugs.  Family History  Problem Relation Age of Onset   Hypertension Mother    GER disease Mother    Dementia Mother    Osteoporosis Mother     Arthritis Mother    Bipolar disorder Mother    Parkinson's disease Father    Congestive Heart Failure Father    Pneumonia Father    Arthritis Father    Dementia Father    Depression Father    Asthma Sister    Depression Sister  Bipolar disorder Brother    Asthma Daughter    Colon cancer Neg Hx    Esophageal cancer Neg Hx    Stomach cancer Neg Hx    Rectal cancer Neg Hx      Physical Exam: Vitals:   11/29/23 1930 11/29/23 2331 11/29/23 2331 11/30/23 0020  BP: 110/61 (!) 143/69  (!) 121/55  Pulse: 69 64  71  Resp: 18 11  14   Temp: 97.9 F (36.6 C)  97.6 F (36.4 C) (!) 97.4 F (36.3 C)  TempSrc: Oral  Oral Oral  SpO2: 100% 100%  100%  Weight:      Height:        Gen: Awake, alert, Chronically ill appearing   CV: Regular, normal S1, S2, no murmurs  Resp: Normal WOB, CTAB  Abd: Flat, normoactive, nontender MSK: Symmetric, trace edema  Skin: No rashes or lesions to exposed skin  Neuro: Alert and interactive, ? Bradykinesia.  Psych: euthymic, appropriate    Data review:   Labs reviewed, notable for:   Blood glucose 65 -> 84 High-sensitivity Trop 16 -> 29 -> 37 WBC 5  Micro:  Results for orders placed or performed during the hospital encounter of 05/20/23  Culture, blood (routine x 2)     Status: None   Collection Time: 05/20/23  1:34 PM   Specimen: BLOOD  Result Value Ref Range Status   Specimen Description   Final    BLOOD LEFT ANTECUBITAL Performed at [facility], [address] [place], [place] [address]    Special Requests   Final    BOTTLES DRAWN AEROBIC AND ANAEROBIC Blood Culture adequate volume Performed at [facility], [address] [place], [place] [address]    Culture   Final    NO GROWTH 5 DAYS Performed at [facility], [address] [place], [place] [address]    Report Status 05/25/2023 FINAL  Final  Culture, blood (routine x 2)     Status: None   Collection Time: 05/20/23  1:34 PM    Specimen: BLOOD  Result Value Ref Range Status   Specimen Description   Final    BLOOD RIGHT ANTECUBITAL Performed at [facility], [address] [place], [place] [address]    Special Requests   Final    BOTTLES DRAWN AEROBIC AND ANAEROBIC Blood Culture adequate volume Performed at [facility], [address], [place], [place] [address]    Culture   Final    NO GROWTH 5 DAYS Performed at [facility], [address], [place], [place] [address]    Report Status 05/25/2023 FINAL  Final  SARS Coronavirus 2 by RT PCR (hospital order, performed in [facility] hospital lab) *cepheid single result test* Anterior Nasal Swab     Status: None   Collection Time: 05/20/23  1:39 PM   Specimen: Anterior Nasal Swab  Result Value Ref Range Status   SARS Coronavirus 2 by RT PCR NEGATIVE NEGATIVE Final    Comment: (NOTE) SARS-CoV-2 target nucleic acids are NOT DETECTED.  The SARS-CoV-2 RNA is generally detectable in upper and lower respiratory specimens during the acute phase of infection. The lowest concentration of SARS-CoV-2 viral copies this assay can detect is 250 copies / mL. A negative result does not preclude SARS-CoV-2 infection and should not be used as the sole basis for treatment or other patient management decisions.  A negative result may occur with improper specimen collection /  handling, submission of specimen other than nasopharyngeal swab, presence of viral mutation(s) within the areas targeted by this assay, and inadequate number of viral copies (<250 copies / mL). A negative result must be combined with clinical observations, patient history, and epidemiological information.  Fact Sheet for Patients:   RoadLapTop.co.za  Fact Sheet for Healthcare Providers: http://kim-miller.com/  This test is not yet approved or  cleared by the [place]  FDA and has been authorized for detection  and/or diagnosis of SARS-CoV-2 by FDA under an Emergency Use Authorization (EUA).  This EUA will remain in effect (meaning this test can be used) for the duration of the COVID-19 declaration under Section 564(b)(1) of the Act, 21 U.S.C. section 360bbb-3(b)(1), unless the authorization is terminated or revoked sooner.  Performed at [facility], [address] [place], [place] [address]   Urine Culture     Status: Abnormal   Collection Time: 05/20/23  7:15 PM   Specimen: Urine, Random  Result Value Ref Range Status   Specimen Description   Final    URINE, RANDOM Performed at [facility], [address], [place], [place] [address]    Special Requests   Final    NONE Reflexed from [id number] Performed at [facility], [address], [place], [place] [address]    Culture (A)  Final    >=100,000 COLONIES/mL ESCHERICHIA COLI 50,000 COLONIES/mL KLEBSIELLA OXYTOCA Confirmed Extended Spectrum Beta-Lactamase Producer (ESBL).  In bloodstream infections from ESBL organisms, carbapenems are preferred over piperacillin/tazobactam. They are shown to have a lower risk of mortality.    Report Status 05/24/2023 FINAL  Final   Organism ID, Bacteria ESCHERICHIA COLI (A)  Final   Organism ID, Bacteria KLEBSIELLA OXYTOCA (A)  Final      Susceptibility   Escherichia coli - MIC*    AMPICILLIN >=32 RESISTANT Resistant     CEFAZOLIN 8 SENSITIVE Sensitive     CEFEPIME <=0.12 SENSITIVE Sensitive     CEFTRIAXONE <=0.25 SENSITIVE Sensitive     CIPROFLOXACIN <=0.25 SENSITIVE Sensitive     GENTAMICIN <=1 SENSITIVE Sensitive     IMIPENEM <=0.25 SENSITIVE Sensitive     NITROFURANTOIN <=16 SENSITIVE Sensitive     TRIMETH/SULFA <=20 SENSITIVE Sensitive     AMPICILLIN/SULBACTAM >=32 RESISTANT Resistant     PIP/TAZO <=4 SENSITIVE Sensitive     * >=100,000 COLONIES/mL ESCHERICHIA COLI   Klebsiella oxytoca - MIC*    AMPICILLIN >=32 RESISTANT Resistant      CEFEPIME 2 SENSITIVE Sensitive     CEFTRIAXONE >=64 RESISTANT Resistant     CIPROFLOXACIN 0.5 INTERMEDIATE Intermediate     GENTAMICIN >=16 RESISTANT Resistant     IMIPENEM <=0.25 SENSITIVE Sensitive     NITROFURANTOIN 32 SENSITIVE Sensitive     TRIMETH/SULFA >=320 RESISTANT Resistant     AMPICILLIN/SULBACTAM >=32 RESISTANT Resistant     PIP/TAZO 8 SENSITIVE Sensitive     * 50,000 COLONIES/mL KLEBSIELLA OXYTOCA    Imaging reviewed:  CT ABDOMEN PELVIS W CONTRAST Result Date: 11/29/2023 CLINICAL DATA:  Acute abdominal pain and hypotension EXAM: CT ABDOMEN AND PELVIS WITH CONTRAST TECHNIQUE: Multidetector CT imaging of the abdomen and pelvis was performed using the standard protocol following bolus administration of intravenous contrast. RADIATION DOSE REDUCTION: This exam was performed according to the departmental dose-optimization program which includes automated exposure control, adjustment of the mA and/or kV according to patient size and/or use of iterative reconstruction technique. CONTRAST:  80mL OMNIPAQUE IOHEXOL 300 MG/ML  SOLN COMPARISON:  07/08/2023  FINDINGS: Lower chest: Stable scarring in the right lower lobe is seen. Visualized lung base on the left is unremarkable. Hepatobiliary: Gallbladder has been surgically removed. Biliary ductal dilatation is noted consistent with the post cholecystectomy state. The liver is otherwise within normal limits. Pancreas: Unremarkable. No pancreatic ductal dilatation or surrounding inflammatory changes. Spleen: Normal in size without focal abnormality. Adrenals/Urinary Tract: Adrenal glands are within normal limits. Kidneys demonstrate a normal enhancement pattern bilaterally. No obstructive changes are seen. The bladder is within normal limits. Stomach/Bowel: No obstructive or inflammatory changes of the colon are seen. The appendix is not well visualized and may have been surgically removed. Small bowel shows no obstructive change. A transient appearing  intussusception is noted in the left abdomen near a small bowel anastomosis site likely related to the prior gastric bypass. Postsurgical changes in the stomach are noted. A portion of the stomach extends into the chest and is stable. Vascular/Lymphatic: Aortic atherosclerosis. No enlarged abdominal or pelvic lymph nodes. Reproductive: Status post hysterectomy. No adnexal masses. Other: No abdominal wall hernia or abnormality. No abdominopelvic ascites. Musculoskeletal: InterStim device is noted on the left. Spinal stimulator is noted. No acute bony abnormality is seen. IMPRESSION: Postsurgical changes consistent with the given clinical history. Likely transient intussusception in the small bowel of the left abdomen. No complicating factors are identified in this is likely related to the prior small bowel anastomosis. No other focal abnormality is noted. Electronically Signed   By: Violeta Grey M.D.   On: 11/29/2023 21:44   DG Chest Portable 1 View Result Date: 11/29/2023 CLINICAL DATA:  Shortness of breath, rib pain EXAM: PORTABLE CHEST 1 VIEW COMPARISON:  05/20/2023 FINDINGS: Unchanged distribution of dorsal column stimulator leads. Power injectable Port-A-Cath tip: Cavoatrial junction. Heart size within normal limits. The patient is rotated to the right on today's radiograph, reducing diagnostic sensitivity and specificity. Right lower lobectomy with chronic retrocardiac opacity on the right, and old right rib deformities. IMPRESSION: 1. No acute findings. 2. Right lower lobectomy with chronic retrocardiac opacity on the right. 3. Old right rib deformities. Electronically Signed   By: Freida Jes M.D.   On: 11/29/2023 19:53   EKG: Personally reviewed, borderline LVH, LBBB, no acute ischemic changes  ED Course:  On EMS arrival BP 91/49.  Treated with Zosyn based on prior susceptibilities, midodrine 10 mg, 500 cc IV fluid.   Assessment/Plan:  78 y.o. female with hx istory ESBL UTI, orthostatic  hypotension, hypothyroidism, CKD 3A, HFpEF, PMR, IgG deficiency, gastric bypass, spinal stimulator, remote lung cancer status post wedge resection, who was referred to the ED by urology due to hypotension and being treated for complicated UTI.  Complicated UTI  History ESBL UTI Micro reviewed with last isolate 10/'24 with E coli (amp R), Kleb oxytoca (ESBL; Cefepime and Zosyn S).  Afebrile, WBC 5.  CT abdomen pelvis with no signs of pyelonephritis. -Continue Zosyn 3375 mg IV every 8 hours -Follow-up urine culture -Contact precautions of prior ESBL  Hypotension-resolved History of orthostatic hypotension On EMS arrival BP 90s over 40s and symptomatic.  At time of ED arrival BP had normalized.  Suspect related to chronic hypotension / autonomic dysfunction, possibly hypovolemic.  Did not take midodrine prior to her urology appointment. -S/p 500 cc IV fluid.  Continue oral hydration - Continue her home midodrine and fludrocortisone  Acute myocardial injury High-sensitivity troponin with low level elevation 16 -> 29 -> 37.  EKG without acute ischemic changes.  Suspect this is a demand event in  the setting of transient hypotension and infection. - Management directed at blood pressure and UTI per above. - Check A1c and lipids  Incidental note of transient appearing intussusception of small bowel Reassuring abd exam with no tenderness.  -Follow clinically if worsening abdominal symptoms would consider repeat scan, general surgery involvement.  C diff prophylaxis  Hx of recurrent C diff  -Started on vancomycin p.o. daily to continue through abx plus additional week.   Chronic medical problems: Hypothyroidism: Continue home levothyroxine.  Check TSH CKD stage IIIa: Baseline creatinine approximately 1.6 HFpEF: Without acute exacerbation.  Holding torsemide appears slightly volume down. History of PMR: On Sarlimumab outpatient IgG deficiency: On IgG outpatient. History of gastric bypass:  Noted Spinal stimulator: Noted Remote history lung cancer s/p wedge resection: Outpatient follow-up surveillance GERD: Stop her home PPI, continue famotidine OAB: Hold her home Myrbetriq inpatient. ?  Neuropathy: Continue home Lyrica.   Body mass index is 23.34 kg/m.    DVT prophylaxis:  SQ Heparin Code Status:  Full Code Diet:  Diet Orders (From admission, onward)     Start     Ordered   11/30/23 0046  Diet regular Room service appropriate? Yes; Fluid consistency: Thin  Diet effective now       Question Answer Comment  Room service appropriate? Yes   Fluid consistency: Thin      11/30/23 0046           Family Communication:  No   Consults:  none   Admission status:   Observation, Telemetry bed  Severity of Illness: The appropriate patient status for this patient is OBSERVATION. Observation status is judged to be reasonable and necessary in order to provide the required intensity of service to ensure the patient's safety. The patient's presenting symptoms, physical exam findings, and initial radiographic and laboratory data in the context of their medical condition is felt to place them at decreased risk for further clinical deterioration. Furthermore, it is anticipated that the patient will be medically stable for discharge from the hospital within 2 midnights of admission.    Arnulfo Larch, MD [facility]  How to contact the [facility] Attending or Consulting provider 7A - 7P or covering provider during after hours 7P -7A, for this patient.  Check the care team in [facility] and look for a) attending/consulting [facility] provider listed and b) the [facility] team listed Log into [email or web address] and use [facility] universal password to access. If you do not have the password, please contact the hospital operator. Locate the [facility] provider you are looking for under [facility] and page to a number that you can be directly reached. If you still have difficulty reaching the provider,  please page the [facility] (Director on Call) for the Hospitalists listed on amion for assistance.  11/30/2023, 12:47 AM

## 2023-12-01 DIAGNOSIS — N390 Urinary tract infection, site not specified: Secondary | ICD-10-CM | POA: Diagnosis not present

## 2023-12-01 LAB — CBC
HCT: 35.3 % — ABNORMAL LOW (ref 36.0–46.0)
Hemoglobin: 11.2 g/dL — ABNORMAL LOW (ref 12.0–15.0)
MCH: 30.7 pg (ref 26.0–34.0)
MCHC: 31.7 g/dL (ref 30.0–36.0)
MCV: 96.7 fL (ref 80.0–100.0)
Platelets: 100 10*3/uL — ABNORMAL LOW (ref 150–400)
RBC: 3.65 MIL/uL — ABNORMAL LOW (ref 3.87–5.11)
RDW: 13.3 % (ref 11.5–15.5)
WBC: 3.4 10*3/uL — ABNORMAL LOW (ref 4.0–10.5)
nRBC: 0 % (ref 0.0–0.2)

## 2023-12-01 LAB — BASIC METABOLIC PANEL WITH GFR
Anion gap: 5 (ref 5–15)
BUN: 25 mg/dL — ABNORMAL HIGH (ref 8–23)
CO2: 25 mmol/L (ref 22–32)
Calcium: 7.9 mg/dL — ABNORMAL LOW (ref 8.9–10.3)
Chloride: 111 mmol/L (ref 98–111)
Creatinine, Ser: 1.29 mg/dL — ABNORMAL HIGH (ref 0.44–1.00)
GFR, Estimated: 43 mL/min — ABNORMAL LOW (ref >60)
Glucose, Bld: 95 mg/dL (ref 70–99)
Potassium: 3.8 mmol/L (ref 3.5–5.1)
Sodium: 141 mmol/L (ref 135–145)

## 2023-12-01 LAB — TROPONIN I (HIGH SENSITIVITY): Troponin I (High Sensitivity): 13 ng/L (ref <18)

## 2023-12-01 NOTE — Plan of Care (Signed)
  Problem: Education: Goal: Knowledge of General Education information will improve Description: Including pain rating scale, medication(s)/side effects and non-pharmacologic comfort measures Outcome: Progressing   Problem: Health Behavior/Discharge Planning: Goal: Ability to manage health-related needs will improve Outcome: Progressing   Problem: Clinical Measurements: Goal: Ability to maintain clinical measurements within normal limits will improve Outcome: Progressing Goal: Will remain free from infection Outcome: Progressing Goal: Diagnostic test results will improve Outcome: Progressing Goal: Respiratory complications will improve Outcome: Progressing Goal: Cardiovascular complication will be avoided Outcome: Progressing   Problem: Nutrition: Goal: Adequate nutrition will be maintained Outcome: Progressing   Problem: Coping: Goal: Level of anxiety will decrease Outcome: Progressing   Problem: Elimination: Goal: Will not experience complications related to bowel motility Outcome: Progressing Goal: Will not experience complications related to urinary retention Outcome: Progressing   Problem: Pain Managment: Goal: General experience of comfort will improve and/or be controlled Outcome: Progressing   Problem: Safety: Goal: Ability to remain free from injury will improve Outcome: Progressing   Problem: Skin Integrity: Goal: Risk for impaired skin integrity will decrease Outcome: Progressing   Problem: Urinary Elimination: Goal: Signs and symptoms of infection will decrease Outcome: Progressing   Problem: Activity: Goal: Risk for activity intolerance will decrease Outcome: Not Progressing

## 2023-12-01 NOTE — Progress Notes (Signed)
 PT Cancellation Note  Patient Details Name: Megan Brewer MRN: [id number] DOB: 04/14/1946   Cancelled Treatment:    Reason Eval/Treat Not Completed: Pain limiting ability to participate (pt reports she's "feeling terrible" right now, pt is tremulous in bed, reports abdominal pain. Nurse notified of request for pain medication. Pt requested PT check back later this afternoon. Will follow.)   Daymon Evans PT 12/01/2023  Acute Rehabilitation Services  Office [phone]

## 2023-12-01 NOTE — Progress Notes (Signed)
 PROGRESS NOTE    Megan Brewer  QIH:[id number] DOB: 1945/11/16 DOA: 11/29/2023 PCP: Jobe Mulder, DO   Brief Narrative: This patient was sent to ER from the urology office due to hypotension and urinary complaints.  Her main complaints were intense burning with urination, minimal urinary output and lower abdominal pain.  She has a history of ESBL UTI orthostatic hypotension CKD stage IIIa hypothyroidism IgG deficiency gastric bypass spinal stimulator history of lung cancer status post wedge resection heart failure with preserved ejection fraction and PMR.  Her blood pressure was 90 over 40s with EMS on arrival which improved with IV fluids.  She has a history of chronic hypotension and takes midodrine at home which she did not take prior to going to the urology office. Chest x-ray no acute findings  Assessment & Plan:   Principal Problem:   Complicated UTI (urinary tract infection)  #1 hypotension secondary to UTI in the setting of history of ESBL urine culture over 100,000 colonies of gram-negative rods sensitivity pending. She has bilateral CVA tenderness No leukocytosis on admission Continue Zosyn till final urine culture is back Continue midodrine and Florinef which she was taking prior to admission  #2 elevated troponin-likely due to demand  Troponin 41 from 37 from 29 from 16 Since it is trending up I will continue to trend with no complaints of chest pain EKG with no acute ST-T wave changes  #3 abnormal CT concern for transient intussusception of small bowel patient denies abdominal pain today no tenderness or rebound on palpation.  She is having flatus.  #4 C. difficile prophylaxis due to history of recurrent C. difficile per patient on p.o. vancomycin while on antibiotics Zosyn  #5 hypothyroidism continue Synthroid  #6 CKD stage III A stable  #7 heart failure with preserved ejection fraction on torsemide which is currently on hold  #8 history of IgG  deficiency followed as an outpatient on IgG  #9 history of spinal stimulator/history of gastric bypass/history of lung cancer status post wedge resection stable  #10 hypokalemia repleted  Estimated body mass index is 21.82 kg/m as calculated from the following:   Height as of this encounter: 5' 4.02" (1.626 m).   Weight as of this encounter: 57.7 kg.  DVT prophylaxis: Lovenox  code Status full code Family Communication: none Disposition Plan:  Status is: Inpatient Remains inpatient appropriate because: Acute illness   Consultants: None  Procedures: None Antimicrobials: Zosyn  Subjective: Resting in bed anxious  Concerned about taking antibiotics due to history of C. difficile continues to have urinary symptoms intense pain on urination and not emptying the bladder completely  Objective: Vitals:   11/30/23 1408 11/30/23 1630 11/30/23 2011 12/01/23 0705  BP: 110/60 108/63 122/68 (!) 117/58  Pulse: 69 70 69 68  Resp:   18 16  Temp: 97.6 F (36.4 C) 97.6 F (36.4 C) (!) 97.5 F (36.4 C) 97.6 F (36.4 C)  TempSrc: Oral     SpO2: 100% 100% 97% 100%  Weight:      Height:        Intake/Output Summary (Last 24 hours) at 12/01/2023 1152 Last data filed at 11/30/2023 1625 Gross per 24 hour  Intake 394.67 ml  Output --  Net 394.67 ml   Filed Weights   11/29/23 1644 11/30/23 0020  Weight: 61.7 kg 57.7 kg    Examination:  General exam: Appears no acute distress  respiratory system: Clear to auscultation. Respiratory effort normal. Cardiovascular system: Regular. Gastrointestinal system: Soft non  distended no rebound, bilateral CVA tenderness  Central nervous system: Alert and oriented. No focal neurological deficits. Extremities: No edema  Data Reviewed: I have personally reviewed following labs and imaging studies  CBC: Recent Labs  Lab 11/29/23 1801 11/30/23 0438 12/01/23 0356  WBC 5.5 4.8 3.4*  NEUTROABS 2.9  --   --   HGB 13.0 11.9* 11.2*  HCT 39.1  35.5* 35.3*  MCV 91.8 92.7 96.7  PLT 129* 113* 100*   Basic Metabolic Panel: Recent Labs  Lab 11/29/23 1801 11/30/23 0438 12/01/23 0356  NA 139 141 141  K 3.5 3.2* 3.8  CL 103 106 111  CO2 28 27 25   GLUCOSE 65* 65* 95  BUN 39* 34* 25*  CREATININE 1.71* 1.42* 1.29*  CALCIUM 9.4 8.6* 7.9*  MG  --  2.4  --   PHOS  --  3.6  --    GFR: Estimated Creatinine Clearance: 32.5 mL/min (A) (by C-G formula based on SCr of 1.29 mg/dL (H)). Liver Function Tests: Recent Labs  Lab 11/29/23 1801  AST 49*  ALT 37  ALKPHOS 47  BILITOT 0.5  PROT 7.1  ALBUMIN 3.1*   No results for input(s): "LIPASE", "AMYLASE" in the last 168 hours. No results for input(s): "AMMONIA" in the last 168 hours. Coagulation Profile: No results for input(s): "INR", "PROTIME" in the last 168 hours. Cardiac Enzymes: No results for input(s): "CKTOTAL", "CKMB", "CKMBINDEX", "TROPONINI" in the last 168 hours. BNP (last 3 results) No results for input(s): "PROBNP" in the last 8760 hours. HbA1C: Recent Labs    11/30/23 0438  HGBA1C 5.3   CBG: Recent Labs  Lab 11/29/23 1945  GLUCAP 84   Lipid Profile: Recent Labs    11/30/23 0438  CHOL 135  HDL 55  LDLCALC 59  TRIG 104  CHOLHDL 2.5   Thyroid Function Tests: Recent Labs    11/30/23 0438  TSH 4.778*   Anemia Panel: No results for input(s): "VITAMINB12", "FOLATE", "FERRITIN", "TIBC", "IRON", "RETICCTPCT" in the last 72 hours. Sepsis Labs: No results for input(s): "PROCALCITON", "LATICACIDVEN" in the last 168 hours.  Recent Results (from the past 240 hours)  Urine Culture     Status: Abnormal (Preliminary result)   Collection Time: 11/29/23 11:20 PM   Specimen: Urine, Clean Catch  Result Value Ref Range Status   Specimen Description   Final    URINE, CLEAN CATCH Performed at [facility], [address] [place], [place] [address]    Special Requests   Final    NONE Performed at [facility], [address] [place], [place] [address]    Culture (A)  Final    >=100,000 COLONIES/mL GRAM NEGATIVE RODS SUSCEPTIBILITIES TO FOLLOW Performed at [facility], [address], [place], [place] [address]    Report Status PENDING  Incomplete         Radiology Studies: CT ABDOMEN PELVIS W CONTRAST Result Date: 11/29/2023 CLINICAL DATA:  Acute abdominal pain and hypotension EXAM: CT ABDOMEN AND PELVIS WITH CONTRAST TECHNIQUE: Multidetector CT imaging of the abdomen and pelvis was performed using the standard protocol following bolus administration of intravenous contrast. RADIATION DOSE REDUCTION: This exam was performed according to the departmental dose-optimization program which includes automated exposure control, adjustment of the mA and/or kV according to patient size and/or use of iterative reconstruction technique. CONTRAST:  80mL OMNIPAQUE IOHEXOL 300 MG/ML  SOLN COMPARISON:  07/08/2023 FINDINGS: Lower chest: Stable scarring in the right lower lobe is seen.  Visualized lung base on the left is unremarkable. Hepatobiliary: Gallbladder has been surgically removed. Biliary ductal dilatation is noted consistent with the post cholecystectomy state. The liver is otherwise within normal limits. Pancreas: Unremarkable. No pancreatic ductal dilatation or surrounding inflammatory changes. Spleen: Normal in size without focal abnormality. Adrenals/Urinary Tract: Adrenal glands are within normal limits. Kidneys demonstrate a normal enhancement pattern bilaterally. No obstructive changes are seen. The bladder is within normal limits. Stomach/Bowel: No obstructive or inflammatory changes of the colon are seen. The appendix is not well visualized and may have been surgically removed. Small bowel shows no obstructive change. A transient appearing intussusception is noted in the left abdomen near a small bowel anastomosis site likely related to the prior gastric bypass. Postsurgical changes in the stomach are  noted. A portion of the stomach extends into the chest and is stable. Vascular/Lymphatic: Aortic atherosclerosis. No enlarged abdominal or pelvic lymph nodes. Reproductive: Status post hysterectomy. No adnexal masses. Other: No abdominal wall hernia or abnormality. No abdominopelvic ascites. Musculoskeletal: InterStim device is noted on the left. Spinal stimulator is noted. No acute bony abnormality is seen. IMPRESSION: Postsurgical changes consistent with the given clinical history. Likely transient intussusception in the small bowel of the left abdomen. No complicating factors are identified in this is likely related to the prior small bowel anastomosis. No other focal abnormality is noted. Electronically Signed   By: Violeta Grey M.D.   On: 11/29/2023 21:44   DG Chest Portable 1 View Result Date: 11/29/2023 CLINICAL DATA:  Shortness of breath, rib pain EXAM: PORTABLE CHEST 1 VIEW COMPARISON:  05/20/2023 FINDINGS: Unchanged distribution of dorsal column stimulator leads. Power injectable Port-A-Cath tip: Cavoatrial junction. Heart size within normal limits. The patient is rotated to the right on today's radiograph, reducing diagnostic sensitivity and specificity. Right lower lobectomy with chronic retrocardiac opacity on the right, and old right rib deformities. IMPRESSION: 1. No acute findings. 2. Right lower lobectomy with chronic retrocardiac opacity on the right. 3. Old right rib deformities. Electronically Signed   By: Freida Jes M.D.   On: 11/29/2023 19:53    Scheduled Meds:  Chlorhexidine Gluconate Cloth  6 each Topical Daily   docusate sodium  200 mg Oral BID   famotidine  20 mg Oral BID   fludrocortisone  100 mcg Oral BID   heparin  5,000 Units Subcutaneous Q8H   levothyroxine  75 mcg Oral Q0600   midodrine  10 mg Oral TID WC   pantoprazole  40 mg Oral Daily   polyethylene glycol  17 g Oral BID   pregabalin  50 mg Oral TID   sodium chloride flush  3 mL Intravenous Q12H    torsemide  20 mg Oral Daily   vancomycin  125 mg Oral Daily   Continuous Infusions:  piperacillin-tazobactam (ZOSYN)  IV Stopped (12/01/23 0847)     LOS: 1 day    Time spent: 38 min Barbee Lew, MD  12/01/2023, 11:52 AM

## 2023-12-01 NOTE — Telephone Encounter (Signed)
 Patient's husband was advised and verbalized understanding. He wanted Dr. Gwenette Lennox to know patient was in the hospital for a UTI. Just an FYI

## 2023-12-01 NOTE — Progress Notes (Signed)
 PT Cancellation Note  Patient Details Name: Megan Brewer MRN: [id number] DOB: 19-Aug-1945   Cancelled Treatment:    Reason Eval/Treat Not Completed: Fatigue/lethargy limiting ability to participate (pt reports she's still feeling very poorly, not able to tolerate any activity at present. Noted pt ambulated 500' with mobility specialist yesterday. Will follow.)   Daymon Evans PT 12/01/2023  Acute Rehabilitation Services  Office [phone]

## 2023-12-01 NOTE — Progress Notes (Signed)
   12/01/23 1430  TOC Brief Assessment  Insurance and Status Reviewed  Patient has primary care physician Yes  Home environment has been reviewed Home w/ spouse  Prior level of function: modified independent  Prior/Current Home Services No current home services  Social Drivers of Health Review SDOH reviewed no interventions necessary  Readmission risk has been reviewed Yes  Transition of care needs transition of care needs identified, TOC will continue to follow   TOC will follow for PT evaluation and recommendations.

## 2023-12-02 ENCOUNTER — Ambulatory Visit: Payer: Self-pay

## 2023-12-02 ENCOUNTER — Inpatient Hospital Stay (HOSPITAL_COMMUNITY)

## 2023-12-02 ENCOUNTER — Other Ambulatory Visit (HOSPITAL_COMMUNITY): Payer: Self-pay

## 2023-12-02 DIAGNOSIS — N390 Urinary tract infection, site not specified: Secondary | ICD-10-CM | POA: Diagnosis not present

## 2023-12-02 LAB — URINE CULTURE: Culture: >=100000 — AB

## 2023-12-02 MED ORDER — HEPARIN SOD (PORK) LOCK FLUSH 100 UNIT/ML IV SOLN
500.0000 [IU] | INTRAVENOUS | Status: AC | PRN
  Administered 2023-12-02: 500 [IU]

## 2023-12-02 MED ORDER — CEFADROXIL 500 MG PO CAPS: 500.0000 mg | ORAL_CAPSULE | Freq: Two times a day (BID) | ORAL | 0 refills | Status: DC

## 2023-12-02 MED ORDER — VANCOMYCIN HCL 125 MG PO CAPS
125.0000 mg | ORAL_CAPSULE | Freq: Every day | ORAL | 0 refills | Status: DC
  Filled 2023-12-02: qty 7, 7d supply, fill #0

## 2023-12-02 NOTE — Telephone Encounter (Signed)
 Chief Complaint: diarrhea Symptoms: nausea, "fuzzy" loose stool Frequency: x 1 day Pertinent Negatives: Patient denies vomiting, fever, dry mouth, blood in stool. Disposition: [x] ED /[] Urgent Care (no appt availability in office) / [] Appointment(In office/virtual)/ []  [facility]/ [] Home Care/ [x] Refused Recommended Disposition /[] [facility]/ []  Follow-up with PCP Additional Notes: Patient was sent to ER by her urologist for a UTI. She states she was on Vancomycin and another antibiotic (unsure of the name) during her hospitalization since about Tuesday. She states she was discharged today on cefadroxil and was told she would continue on the vancomycin. She states she has had C-diff in the past with sepsis. Patient has not started the cefadroxil and is concerned about being placed on 2 antibiotics at the same time. She states she did report these symptoms to her nurse before being discharged. No appointments available with PCP or with providers at [facility].  Advised ED for severity of symptoms and patient states she will go if she has to but would like to know if there is anything Dr Carmelia Roller can advise. Called [facility] and notified staff.  Copied from CRM [id number]. Topic: Clinical - Red Word Triage >> Dec 02, 2023  2:43 PM Almira Coaster wrote: Red Word that prompted transfer to Nurse Triage: Patient was discharged from the hospital and prescribed cefadroxil (DURICEF) 500 MG capsule , she is not sure if its a side effect but she has been experiencing uncontrollable diarrhea. Reason for Disposition  SEVERE abdominal pain (e.g., excruciating)  Answer Assessment - Initial Assessment Questions 1. ANTIBIOTIC: "What antibiotic are you taking?" "How many times per day?"     Discharged on cefadroxil and vancomycin. She states she has not started the cefadroxil yet.  2. ANTIBIOTIC ONSET: "When was the antibiotic started?"     Vancomycin and other antibiotic (she is unsure of the name) were  started on Tuesday.   3. DIARRHEA SEVERITY: "How bad is the diarrhea?" "How many more stools have you had in the past 24 hours than normal?"    - NO DIARRHEA (SCALE 0)   - MILD (SCALE 1-3): Few loose or mushy BMs; increase of 1-3 stools over normal daily number of stools; mild increase in ostomy output.   -  MODERATE (SCALE 4-7): Increase of 4-6 stools daily over normal; moderate increase in ostomy output. * SEVERE (SCALE 8-10; OR 'WORST POSSIBLE'): Increase of 7 or more stools daily over normal; moderate increase in ostomy output; incontinence.     1 episode per day. 2 in the last 24 hours.  4. ONSET: "When did the diarrhea begin?"      Yesterday, she states she started having explosive diarrhea.  5. BM CONSISTENCY: "How loose or watery is the diarrhea?"      "Fuzzy pudding". Denies liquid or watery.  6. VOMITING: "Are you also vomiting?" If Yes, ask: "How many times in the past 24 hours?"      Denies.  7. ABDOMEN PAIN: "Are you having any abdomen pain?" If Yes, ask: "What does it feel like?" (e.g., crampy, dull, intermittent, constant)      Yes, bloating and tight. "Up under my ribs. Once the stools start moving the bloating and stuff follows along with it"  8. ABDOMEN PAIN SEVERITY: If present, ask: "How bad is the pain?"  (e.g., Scale 1-10; mild, moderate, or severe)   - MILD (1-3): doesn't interfere with normal activities, abdomen soft and not tender to touch    - MODERATE (4-7): interferes with normal activities  or awakens from sleep, abdomen tender to touch    - SEVERE (8-10): excruciating pain, doubled over, unable to do any normal activities       8/10. She states it started at lunchtime today and yesterday, improved after having a bowel movement.  9. ORAL INTAKE: If vomiting, "Have you been able to drink liquids?" "How much liquids have you had in the past 24 hours?"     In the last 24 hours she has drank: she states she drinks plenty of fluids.  10. HYDRATION: "Any signs of  dehydration?" (e.g., dry mouth [not just dry lips], too weak to stand, dizziness, new weight loss) "When did you last urinate?"       She states she is urinating plenty. Denies signs of dehydration.  11. EXPOSURE: "Have you traveled to a foreign country recently?" "Have you been exposed to anyone with diarrhea?" "Could you have eaten any food that was spoiled?"       Denies.  12. OTHER SYMPTOMS: "Do you have any other symptoms?" (e.g., fever, blood in stool)       Denies.  13. PREGNANCY: "Is there any chance you are pregnant?" "When was your last menstrual period?"       N/A.  Protocols used: Diarrhea on Antibiotics-A-AH

## 2023-12-02 NOTE — Progress Notes (Signed)
 Discharge medication delivered to patient at bedside [facility]

## 2023-12-02 NOTE — Progress Notes (Signed)
 Discharge instructions given to patient questions asked and answered. Sylvia Everts RN

## 2023-12-02 NOTE — Plan of Care (Signed)
  Problem: Education: Goal: Knowledge of General Education information will improve Description: Including pain rating scale, medication(s)/side effects and non-pharmacologic comfort measures Outcome: Progressing   Problem: Health Behavior/Discharge Planning: Goal: Ability to manage health-related needs will improve Outcome: Progressing   Problem: Clinical Measurements: Goal: Ability to maintain clinical measurements within normal limits will improve Outcome: Progressing Goal: Will remain free from infection Outcome: Progressing Goal: Respiratory complications will improve Outcome: Progressing Goal: Cardiovascular complication will be avoided Outcome: Progressing   Problem: Activity: Goal: Risk for activity intolerance will decrease Outcome: Progressing   Problem: Nutrition: Goal: Adequate nutrition will be maintained Outcome: Progressing   Problem: Coping: Goal: Level of anxiety will decrease Outcome: Progressing   Problem: Elimination: Goal: Will not experience complications related to urinary retention Outcome: Progressing   Problem: Safety: Goal: Ability to remain free from injury will improve Outcome: Progressing   Problem: Skin Integrity: Goal: Risk for impaired skin integrity will decrease Outcome: Progressing   Problem: Urinary Elimination: Goal: Signs and symptoms of infection will decrease Outcome: Progressing   Problem: Clinical Measurements: Goal: Diagnostic test results will improve Outcome: Not Progressing   Problem: Elimination: Goal: Will not experience complications related to bowel motility Outcome: Not Progressing   Problem: Pain Managment: Goal: General experience of comfort will improve and/or be controlled Outcome: Not Progressing

## 2023-12-02 NOTE — Discharge Summary (Signed)
 Physician Discharge Summary  Megan Brewer JXB:[id number] DOB: July 11, 1946 DOA: 11/29/2023  PCP: Jobe Mulder, DO  Admit date: 11/29/2023 Discharge date: 12/02/2023  Admitted From: Home Disposition: Home Recommendations for Outpatient Follow-up:  Follow up with PCP in 1-2 weeks Please obtain BMP/CBC in one week  Home Health: None Equipment/Devices: None Discharge Condition: Stable CODE STATUS full code Diet recommendation: Cardiac Brief/Interim Summary: This patient was sent to ER from the urology office due to hypotension and urinary complaints.  Her main complaints were intense burning with urination, minimal urinary output and lower abdominal pain.  She has a history of ESBL UTI orthostatic hypotension CKD stage IIIa hypothyroidism IgG deficiency gastric bypass spinal stimulator history of lung cancer status post wedge resection heart failure with preserved ejection fraction and PMR.  Her blood pressure was 90 over 40s with EMS on arrival which improved with IV fluids.  She has a history of chronic hypotension and takes midodrine at home which she did not take prior to going to the urology office. Chest x-ray no acute findings Discharge Diagnoses:  Principal Problem:   Complicated UTI (urinary tract infection)  #1 hypotension secondary to UTI in the setting of history of ESBL urine culture over 100,000 colonies of E. coli sensitive to cefazolin she was discharged on cefadroxil 500 twice daily.  During her hospital stay she was treated with Zosyn.   Midodrine and Florinef were continued which she was taking prior to admission.     #2 elevated troponin-likely due to demand  Troponin 13 from 41 from 37 from 29 from 16 EKG with no acute ST-T wave changes   #3 abnormal CT concern for transient intussusception of small bowel patient denies abdominal pain today no tenderness or rebound on palpation.  She is having flatus. KUB on the day of discharge showed nonobstructive  bowel gas pattern.  She had absolutely no symptoms of nausea vomiting she had bowel movements and flatus.  She denied abdominal pain.   #4 C. difficile prophylaxis due to history of recurrent C. difficile per patient on p.o. vancomycin while on antibiotics    #5 hypothyroidism continue Synthroid   #6 CKD stage III A stable   #7 heart failure with preserved ejection fraction on torsemide    #8 history of IgG deficiency followed as an outpatient on IgG   #9 history of spinal stimulator/history of gastric bypass/history of lung cancer status post wedge resection stable   #10 hypokalemia repleted  Estimated body mass index is 21.82 kg/m as calculated from the following:   Height as of this encounter: 5' 4.02" (1.626 m).   Weight as of this encounter: 57.7 kg.  Discharge Instructions  Discharge Instructions     Diet - low sodium heart healthy   Complete by: As directed    Increase activity slowly   Complete by: As directed       Allergies as of 12/02/2023       Reactions   Butorphanol Hives, Other (See Comments), Swelling   Cerebral pain, "like someone is brushing my brain with a brillo pad" Other Reaction(s): Not available   Erythromycin Base Diarrhea, Itching, Other (See Comments)   Severe diarrhea   Propoxyphene Nausea Only, Other (See Comments)   Depression, Agitation, Blurred Vision Other Reaction(s): Not available   Sumatriptan Nausea And Vomiting, Other (See Comments), Tinitus   Ringing in the ears, migraines are worse Other Reaction(s): Not available   Tetracycline Nausea Only, Other (See Comments), Tinitus   Causes ringing in  ears, dizziness, migraine, nausea Other Reaction(s): Not available   Ciprofloxacin Hives, Other (See Comments), Tinitus   Headache, dizziness, ringing in the ears Other Reaction(s): Not available   Corticosteroids Other (See Comments)   12/02/2016 interview by Satira Mccallum: Oral dosing causes migraines/nausea/tinnitus. Prev tolerated IV and  intranasal admin w/o difficulty.   Doxycycline Other (See Comments)   unknown Other Reaction(s): Not available   Cefixime Other (See Comments)   Headache Other Reaction(s): Not available   Isometheptene-dichloral-apap Other (See Comments)   Unknown, mood liability   Ketorolac    12/02/2016 interview by JYN: Oral dosing prednisone/corticosteroids causes migraines/nausea/tinnitus. Prev tolerated IV and intranasal admin w/o difficulty. Other Reaction(s): Not available   Prednisone Other (See Comments)   Migraine, dizzy, ringing in ears-can take it IV 12/02/2016 interview by [name]: Oral prednisone dosing causes migraines/nausea/tinnitus. Prev tolerated IV and intranasal admin w/o difficulty. Other Reaction(s): Not available   Promethazine Other (See Comments)   causes severe abdominal pain when taken IV; however she can take PO or IM Other Reaction(s): Not available   Tape Other (See Comments)   Adhesive tapes-patient denies        Medication List     STOP taking these medications    cyanocobalamin 1000 MCG tablet Commonly known as: VITAMIN B12   heparin lock flush 100 UNIT/ML Soln injection   sucralfate 1 GM/10ML suspension Commonly known as: CARAFATE   Ubrelvy 100 MG Tabs Generic drug: Ubrogepant       TAKE these medications    ADVIL ALLERGY SINUS PO Take 1 tablet by mouth daily.   CALCIUM PO Take 1 tablet by mouth in the morning and at bedtime.   cefadroxil 500 MG capsule Commonly known as: DURICEF Take 1 capsule (500 mg total) by mouth 2 (two) times daily.   cholecalciferol 25 MCG (1000 UNIT) tablet Commonly known as: VITAMIN D3 Take 1,000 Units by mouth daily.   cycloSPORINE 0.05 % ophthalmic emulsion Commonly known as: RESTASIS Place 1 drop into both eyes 4 (four) times daily as needed.   dexlansoprazole 60 MG capsule Commonly known as: DEXILANT Take 1 capsule (60 mg total) by mouth daily.   diclofenac Sodium 1 % Gel Commonly known as:  Voltaren Apply 2 g topically 4 (four) times daily. What changed:  when to take this reasons to take this   dicyclomine 20 MG tablet Commonly known as: BENTYL Take 20 mg by mouth in the morning and at bedtime.   famotidine 20 MG tablet Commonly known as: PEPCID Take 1 tablet (20 mg total) by mouth 2 (two) times daily.   ferrous sulfate 325 (65 FE) MG tablet Take 650 mg by mouth daily with breakfast.   fludrocortisone 0.1 MG tablet Commonly known as: FLORINEF TAKE ONE TABLET BY MOUTH TWICE DAILY   Gammagard 20 GM/200ML Soln Generic drug: Immune Globulin (Human) Inject 40 g into the vein every 21 ( twenty-one) days. INFUSE 40G INTRAVENOUSLY EVERY 3 WEEKS   Kevzara 200 MG/1. Soaj Generic drug: Sarilumab Inject 1.14 mLs into the skin every 14 (fourteen) days.   levothyroxine 75 MCG tablet Commonly known as: SYNTHROID TAKE ONE (1) TABLET BY MOUTH EACH DAY BEFORE BREAKFAST ON EMPTY STOMACH   lidocaine 4 % Place 1 patch onto the skin daily. What changed:  when to take this reasons to take this   magnesium oxide 400 MG tablet Commonly known as: MAG-OX Take 400 mg by mouth daily.   methylphenidate 10 MG tablet Commonly known as: Ritalin Take 1 tablet (  10 mg total) by mouth daily. What changed: when to take this   midodrine 10 MG tablet Commonly known as: PROAMATINE TAKE ONE (1) TABLET BY MOUTH 3 TIMES DAILY   Myrbetriq 50 MG Tb24 tablet Generic drug: mirabegron ER Take 50 mg by mouth every evening.   naloxone 4 MG/0.1ML Liqd nasal spray kit Commonly known as: NARCAN Spray up nostril in event of opioid overdose.   potassium chloride 10 MEQ tablet Commonly known as: KLOR-CON Take 1 tablet (10 mEq total) by mouth in the morning, at noon, in the evening, and at bedtime.   pregabalin 50 MG capsule Commonly known as: LYRICA Take 1 capsule (50 mg total) by mouth 3 (three) times daily.   Prolia 60 MG/ML Sosy injection Generic drug: denosumab Pt to get  at office.  Appt is 08/27/23   pseudoephedrine 120 MG 12 hr tablet Commonly known as: SUDAFED Take 120 mg by mouth every 12 (twelve) hours as needed for congestion.   pyridoxine 100 MG tablet Commonly known as: B-6 Take 100 mg by mouth daily.   tamsulosin 0.4 MG Caps capsule Commonly known as: FLOMAX Take 0.4 mg by mouth daily.   torsemide 20 MG tablet Commonly known as: DEMADEX Take 20 mg by mouth 2 (two) times daily.   triamcinolone cream 0.1 % Commonly known as: KENALOG Apply 1 application  topically 2 (two) times daily as needed (rash).   Trulance 3 MG Tabs Generic drug: Plecanatide Take 1 tablet (3 mg total) by mouth daily.   vancomycin 125 MG capsule Commonly known as: VANCOCIN Take 1 capsule (125 mg total) by mouth daily. Start taking on: December 03, 2023   Vitamin D (Ergocalciferol) 1.25 MG (50000 UNIT) Caps capsule Commonly known as: DRISDOL TAKE 1 CAPSULE ONCE WEEKLY   ZINC OXIDE (TOPICAL) 10 % Crea Apply 1 application topically 2 (two) times daily as needed (rash).        Follow-up Information     Sharlene Dory, DO Follow up.   Specialty: Family Medicine Contact information: [address] [place] [address] [phone]                Allergies  Allergen Reactions   Butorphanol Hives, Other (See Comments) and Swelling    Cerebral pain, "like someone is brushing my brain with a brillo pad"  Other Reaction(s): Not available   Erythromycin Base Diarrhea, Itching and Other (See Comments)    Severe diarrhea   Propoxyphene Nausea Only and Other (See Comments)    Depression, Agitation, Blurred Vision  Other Reaction(s): Not available   Sumatriptan Nausea And Vomiting, Other (See Comments) and Tinitus    Ringing in the ears, migraines are worse  Other Reaction(s): Not available   Tetracycline Nausea Only, Other (See Comments) and Tinitus    Causes ringing in ears, dizziness, migraine, nausea  Other Reaction(s):  Not available   Ciprofloxacin Hives, Other (See Comments) and Tinitus    Headache, dizziness, ringing in the ears  Other Reaction(s): Not available   Corticosteroids Other (See Comments)    12/02/2016 interview by BJY: Oral dosing causes migraines/nausea/tinnitus. Prev tolerated IV and intranasal admin w/o difficulty.   Doxycycline Other (See Comments)    unknown  Other Reaction(s): Not available   Cefixime Other (See Comments)    Headache  Other Reaction(s): Not available   Isometheptene-Dichloral-Apap Other (See Comments)    Unknown, mood liability   Ketorolac     12/02/2016 interview by NWG: Oral dosing prednisone/corticosteroids  causes migraines/nausea/tinnitus. Prev tolerated IV and intranasal admin w/o difficulty.  Other Reaction(s): Not available   Prednisone Other (See Comments)    Migraine, dizzy, ringing in ears-can take it IV  12/02/2016 interview by [name]: Oral prednisone dosing causes migraines/nausea/tinnitus. Prev tolerated IV and intranasal admin w/o difficulty.  Other Reaction(s): Not available   Promethazine Other (See Comments)    causes severe abdominal pain when taken IV; however she can take PO or IM  Other Reaction(s): Not available   Tape Other (See Comments)    Adhesive tapes-patient denies    Consultations: none   Procedures/Studies: DG Abd 1 View Result Date: 12/02/2023 CLINICAL DATA:  Pain. EXAM: ABDOMEN - 1 VIEW COMPARISON:  04/04/2020 FINDINGS: No gaseous small bowel or colonic dilatation to suggest obstruction. Surgical staple lines noted right abdomen. Battery packs for stimulator device is overlie the mid abdomen bilaterally, as before. Bones are diffusely demineralized. IMPRESSION: Nonobstructive bowel gas pattern. Electronically Signed   By: Kennith Center M.D.   On: 12/02/2023 12:23   CT ABDOMEN PELVIS W CONTRAST Result Date: 11/29/2023 CLINICAL DATA:  Acute abdominal pain and hypotension EXAM: CT ABDOMEN AND PELVIS WITH CONTRAST TECHNIQUE:  Multidetector CT imaging of the abdomen and pelvis was performed using the standard protocol following bolus administration of intravenous contrast. RADIATION DOSE REDUCTION: This exam was performed according to the departmental dose-optimization program which includes automated exposure control, adjustment of the mA and/or kV according to patient size and/or use of iterative reconstruction technique. CONTRAST:  80mL OMNIPAQUE IOHEXOL 300 MG/ML  SOLN COMPARISON:  07/08/2023 FINDINGS: Lower chest: Stable scarring in the right lower lobe is seen. Visualized lung base on the left is unremarkable. Hepatobiliary: Gallbladder has been surgically removed. Biliary ductal dilatation is noted consistent with the post cholecystectomy state. The liver is otherwise within normal limits. Pancreas: Unremarkable. No pancreatic ductal dilatation or surrounding inflammatory changes. Spleen: Normal in size without focal abnormality. Adrenals/Urinary Tract: Adrenal glands are within normal limits. Kidneys demonstrate a normal enhancement pattern bilaterally. No obstructive changes are seen. The bladder is within normal limits. Stomach/Bowel: No obstructive or inflammatory changes of the colon are seen. The appendix is not well visualized and may have been surgically removed. Small bowel shows no obstructive change. A transient appearing intussusception is noted in the left abdomen near a small bowel anastomosis site likely related to the prior gastric bypass. Postsurgical changes in the stomach are noted. A portion of the stomach extends into the chest and is stable. Vascular/Lymphatic: Aortic atherosclerosis. No enlarged abdominal or pelvic lymph nodes. Reproductive: Status post hysterectomy. No adnexal masses. Other: No abdominal wall hernia or abnormality. No abdominopelvic ascites. Musculoskeletal: InterStim device is noted on the left. Spinal stimulator is noted. No acute bony abnormality is seen. IMPRESSION: Postsurgical changes  consistent with the given clinical history. Likely transient intussusception in the small bowel of the left abdomen. No complicating factors are identified in this is likely related to the prior small bowel anastomosis. No other focal abnormality is noted. Electronically Signed   By: Alcide Clever M.D.   On: 11/29/2023 21:44   DG Chest Portable 1 View Result Date: 11/29/2023 CLINICAL DATA:  Shortness of breath, rib pain EXAM: PORTABLE CHEST 1 VIEW COMPARISON:  05/20/2023 FINDINGS: Unchanged distribution of dorsal column stimulator leads. Power injectable Port-A-Cath tip: Cavoatrial junction. Heart size within normal limits. The patient is rotated to the right on today's radiograph, reducing diagnostic sensitivity and specificity. Right lower lobectomy with chronic retrocardiac opacity on the right, and old  right rib deformities. IMPRESSION: 1. No acute findings. 2. Right lower lobectomy with chronic retrocardiac opacity on the right. 3. Old right rib deformities. Electronically Signed   By: Gaylyn Rong M.D.   On: 11/29/2023 19:53   US ABDOMEN LIMITED RUQ (LIVER/GB) Result Date: 11/13/2023 CLINICAL DATA:  Elevated LFT EXAM: ULTRASOUND ABDOMEN LIMITED RIGHT UPPER QUADRANT COMPARISON:  July 08, 2023 FINDINGS: Gallbladder: Surgically absent Common bile duct: Diameter: Visualized portion measures 11 mm, dilated. This is similar comparison to prior CT. Liver: No focal lesion identified. Mildly heterogeneous but within normal limits in parenchymal echogenicity. Portal vein is patent on color Doppler imaging with normal direction of blood flow towards the liver. Other: None. IMPRESSION: Similar dilatation of the common bile duct in this patient status post cholecystectomy. Recommend correlation with LFTs. If there is a clinical concern for biliary obstruction, then recommend dedicated MRI with and without contrast with MRCP. Electronically Signed   By: Meda Klinefelter M.D.   On: 11/13/2023 12:31    (Echo, Carotid, EGD, Colonoscopy, ERCP)    Subjective:  Anxious to go home  No new c/o Discharge Exam: Vitals:   12/01/23 2018 12/02/23 0611  BP: (!) 159/77 102/76  Pulse: (!) 57 (!) 59  Resp: 18   Temp: (!) 97.5 F (36.4 C) (!) 97.3 F (36.3 C)  SpO2: 97% 100%   Vitals:   12/01/23 0705 12/01/23 1312 12/01/23 2018 12/02/23 0611  BP: (!) 117/58 (!) 134/54 (!) 159/77 102/76  Pulse: 68 81 (!) 57 (!) 59  Resp: 16 20 18    Temp: 97.6 F (36.4 C) 97.9 F (36.6 C) (!) 97.5 F (36.4 C) (!) 97.3 F (36.3 C)  TempSrc:      SpO2: 100% 99% 97% 100%  Weight:      Height:        General: Pt is alert, awake, not in acute distress Cardiovascular: RRR, S1/S2 +, no rubs, no gallops Respiratory: CTA bilaterally, no wheezing, no rhonchi Abdominal: Soft, NT, ND, bowel sounds + Extremities: no edema, no cyanosis    The results of significant diagnostics from this hospitalization (including imaging, microbiology, ancillary and laboratory) are listed below for reference.     Microbiology: Recent Results (from the past 240 hours)  Urine Culture     Status: Abnormal   Collection Time: 11/29/23 11:20 PM   Specimen: Urine, Clean Catch  Result Value Ref Range Status   Specimen Description   Final    URINE, CLEAN CATCH Performed at [facility], [address], [place], [place] [address]    Special Requests   Final    NONE Performed at [facility], [address], [place], [place] [address]    Culture >=100,000 COLONIES/mL ESCHERICHIA COLI (A)  Final   Report Status 12/02/2023 FINAL  Final   Organism ID, Bacteria ESCHERICHIA COLI (A)  Final      Susceptibility   Escherichia coli - MIC*    AMPICILLIN >=32 RESISTANT Resistant     CEFAZOLIN 16 SENSITIVE Sensitive     CEFEPIME <=0.12 SENSITIVE Sensitive     CEFTRIAXONE <=0.25 SENSITIVE Sensitive     CIPROFLOXACIN <=0.25 SENSITIVE Sensitive     GENTAMICIN <=1 SENSITIVE Sensitive      IMIPENEM <=0.25 SENSITIVE Sensitive     NITROFURANTOIN <=16 SENSITIVE Sensitive     TRIMETH/SULFA <=20 SENSITIVE Sensitive     AMPICILLIN/SULBACTAM >=32 RESISTANT Resistant     PIP/TAZO <=4 SENSITIVE Sensitive ug/mL    * >=100,000 COLONIES/mL ESCHERICHIA  COLI     Labs: BNP (last 3 results) Recent Labs    11/29/23 1801  BNP 95.6   Basic Metabolic Panel: Recent Labs  Lab 11/29/23 1801 11/30/23 0438 12/01/23 0356  NA 139 141 141  K 3.5 3.2* 3.8  CL 103 106 111  CO2 28 27 25   GLUCOSE 65* 65* 95  BUN 39* 34* 25*  CREATININE 1.71* 1.42* 1.29*  CALCIUM 9.4 8.6* 7.9*  MG  --  2.4  --   PHOS  --  3.6  --    Liver Function Tests: Recent Labs  Lab 11/29/23 1801  AST 49*  ALT 37  ALKPHOS 47  BILITOT 0.5  PROT 7.1  ALBUMIN 3.1*   No results for input(s): "LIPASE", "AMYLASE" in the last 168 hours. No results for input(s): "AMMONIA" in the last 168 hours. CBC: Recent Labs  Lab 11/29/23 1801 11/30/23 0438 12/01/23 0356  WBC 5.5 4.8 3.4*  NEUTROABS 2.9  --   --   HGB 13.0 11.9* 11.2*  HCT 39.1 35.5* 35.3*  MCV 91.8 92.7 96.7  PLT 129* 113* 100*   Cardiac Enzymes: No results for input(s): "CKTOTAL", "CKMB", "CKMBINDEX", "TROPONINI" in the last 168 hours. BNP: Invalid input(s): "POCBNP" CBG: Recent Labs  Lab 11/29/23 1945  GLUCAP 84   D-Dimer No results for input(s): "DDIMER" in the last 72 hours. Hgb A1c Recent Labs    11/30/23 0438  HGBA1C 5.3   Lipid Profile Recent Labs    11/30/23 0438  CHOL 135  HDL 55  LDLCALC 59  TRIG 104  CHOLHDL 2.5   Thyroid function studies Recent Labs    11/30/23 0438  TSH 4.778*   Anemia work up No results for input(s): "VITAMINB12", "FOLATE", "FERRITIN", "TIBC", "IRON", "RETICCTPCT" in the last 72 hours. Urinalysis    Component Value Date/Time   COLORURINE YELLOW 11/29/2023 2222   APPEARANCEUR HAZY (A) 11/29/2023 2222   LABSPEC 1.045 (H) 11/29/2023 2222   PHURINE 5.0 11/29/2023 2222   GLUCOSEU NEGATIVE  11/29/2023 2222   HGBUR NEGATIVE 11/29/2023 2222   BILIRUBINUR NEGATIVE 11/29/2023 2222   BILIRUBINUR N/A 03/25/2020 1533   KETONESUR NEGATIVE 11/29/2023 2222   PROTEINUR NEGATIVE 11/29/2023 2222   UROBILINOGEN negative (A) 03/25/2020 1533   NITRITE POSITIVE (A) 11/29/2023 2222   LEUKOCYTESUR LARGE (A) 11/29/2023 2222   Sepsis Labs Recent Labs  Lab 11/29/23 1801 11/30/23 0438 12/01/23 0356  WBC 5.5 4.8 3.4*   Microbiology Recent Results (from the past 240 hours)  Urine Culture     Status: Abnormal   Collection Time: 11/29/23 11:20 PM   Specimen: Urine, Clean Catch  Result Value Ref Range Status   Specimen Description   Final    URINE, CLEAN CATCH Performed at [facility], [address], [place], [place] [address]    Special Requests   Final    NONE Performed at [facility], [address] [place], [place] [address]    Culture >=100,000 COLONIES/mL ESCHERICHIA COLI (A)  Final   Report Status 12/02/2023 FINAL  Final   Organism ID, Bacteria ESCHERICHIA COLI (A)  Final      Susceptibility   Escherichia coli - MIC*    AMPICILLIN >=32 RESISTANT Resistant     CEFAZOLIN 16 SENSITIVE Sensitive     CEFEPIME <=0.12 SENSITIVE Sensitive     CEFTRIAXONE <=0.25 SENSITIVE Sensitive     CIPROFLOXACIN <=0.25 SENSITIVE Sensitive     GENTAMICIN <=1 SENSITIVE Sensitive     IMIPENEM <=0.25  SENSITIVE Sensitive     NITROFURANTOIN <=16 SENSITIVE Sensitive     TRIMETH/SULFA <=20 SENSITIVE Sensitive     AMPICILLIN/SULBACTAM >=32 RESISTANT Resistant     PIP/TAZO <=4 SENSITIVE Sensitive ug/mL    * >=100,000 COLONIES/mL ESCHERICHIA COLI     Time coordinating discharge: 39 minutes  SIGNED:   Barbee Lew, MD  [facility] 12/02/2023, 4:50 PM

## 2023-12-02 NOTE — Evaluation (Signed)
 Physical Therapy One Time Evaluation Patient Details Name: Megan Brewer MRN: [id number] DOB: 20-Sep-1945 Today's Date: 12/02/2023  History of Present Illness  78 y.o. female who was referred to the ED by urology due to hypotension and being treated for complicated UTI. PMHx: ESBL UTI, orthostatic hypotension, hypothyroidism, CKD 3A, HFpEF, PMR, IgG deficiency, gastric bypass, spinal stimulator, remote lung cancer status post wedge resection  Clinical Impression  Patient evaluated by Physical Therapy with no further acute PT needs identified. All education has been completed and the patient has no further questions.  Pt reports feeling better today and ambulated in hallway good distance.  Mobility appears to be at baseline.  Pt already has OPPT scheduled for next week. No further follow-up Physical Therapy or equipment needs. PT is signing off. Thank you for this referral.         If plan is discharge home, recommend the following: A little help with walking and/or transfers;Assistance with cooking/housework;Help with stairs or ramp for entrance;Assist for transportation   Can travel by private vehicle        Equipment Recommendations None recommended by PT  Recommendations for Other Services       Functional Status Assessment Patient has had a recent decline in their functional status and demonstrates the ability to make significant improvements in function in a reasonable and predictable amount of time.     Precautions / Restrictions Precautions Precautions: Fall      Mobility  Bed Mobility Overal bed mobility: Modified Independent                  Transfers Overall transfer level: Modified independent                      Ambulation/Gait Ambulation/Gait assistance: Supervision Gait Distance (Feet): 400 Feet Assistive device: IV Pole Gait Pattern/deviations: Step-through pattern, Decreased stride length, Trunk flexed       General Gait Details:  pt typically furniture cruises at home so pt pushed IV pole, no LOB or unsteadiness observed; pt typically uses power w/c for longer distances at baseline  Stairs            Wheelchair Mobility     Tilt Bed    Modified Rankin (Stroke Patients Only)       Balance Overall balance assessment: History of Falls                                           Pertinent Vitals/Pain Pain Assessment Pain Assessment: No/denies pain    Home Living Family/patient expects to be discharged to:: Private residence Living Arrangements: Spouse/significant other Available Help at Discharge: Family;Personal care attendant (PCA 8 hrs/day) Type of Home: House         Home Layout: Able to live on main level with bedroom/bathroom Home Equipment: Rolling Walker (2 wheels);Cane - single point;Wheelchair - power      Prior Function Prior Level of Function : Independent/Modified Independent             Mobility Comments: "furniture cruises" short distances, uses power w/c for longer distances       Extremity/Trunk Assessment        Lower Extremity Assessment Lower Extremity Assessment: Generalized weakness       Communication   Communication Communication: No apparent difficulties    Cognition Arousal: Alert Behavior During Therapy: WFL for tasks  assessed/performed   PT - Cognitive impairments: No apparent impairments                         Following commands: Intact       Cueing       General Comments      Exercises     Assessment/Plan    PT Assessment All further PT needs can be met in the next venue of care  PT Problem List Decreased activity tolerance;Decreased balance;Decreased mobility       PT Treatment Interventions      PT Goals (Current goals can be found in the Care Plan section)  Acute Rehab PT Goals PT Goal Formulation: All assessment and education complete, DC therapy    Frequency       Co-evaluation                AM-PAC PT "6 Clicks" Mobility  Outcome Measure Help needed turning from your back to your side while in a flat bed without using bedrails?: None Help needed moving from lying on your back to sitting on the side of a flat bed without using bedrails?: None Help needed moving to and from a bed to a chair (including a wheelchair)?: A Little Help needed standing up from a chair using your arms (e.g., wheelchair or bedside chair)?: A Little Help needed to walk in hospital room?: A Little Help needed climbing 3-5 steps with a railing? : A Little 6 Click Score: 20    End of Session   Activity Tolerance: Patient tolerated treatment well Patient left: in bed;with call bell/phone within reach Nurse Communication: Mobility status PT Visit Diagnosis: Other abnormalities of gait and mobility (R26.89)    Time: 1040-1050 PT Time Calculation (min) (ACUTE ONLY): 10 min   Charges:   PT Evaluation $PT Eval Low Complexity: 1 Low   PT General Charges $$ ACUTE PT VISIT: 1 Visit       Blanch Bunde, DPT Physical Therapist Acute Rehabilitation Services Office: [phone]   Myna Asal Payson 12/02/2023, 12:11 PM

## 2023-12-03 ENCOUNTER — Telehealth: Payer: Self-pay | Admitting: *Deleted

## 2023-12-03 NOTE — Transitions of Care (Post Inpatient/ED Visit) (Signed)
 12/03/2023  Name: Megan Brewer MRN: [id number] DOB: Jan 02, 1946  Today's TOC FU Call Status: Today's TOC FU Call Status:: Successful TOC FU Call Completed TOC FU Call Complete Date: 12/03/23 Patient's Name and Date of Birth confirmed.  Transition Care Management Follow-up Telephone Call Date of Discharge: 12/02/23 Discharge Facility: [facility]) Type of Discharge: Inpatient Admission Primary Inpatient Discharge Diagnosis:: Complicated UTI with hypotension- admitted from urology office How have you been since you were released from the hospital?: Better ("I am okay; pretty much independent, but I have a paid caregiver every day of the week and my husband helps out too if needed.  I am wheelchair bound due to an autonomic issue, but I can do most everything without help") Any questions or concerns?: Yes Patient Questions/Concerns:: "Taking 2 antibiotics is tearing up my gut.  I am having diarrhea and taking immodium; I am prone to getting C-Difficile, so I take pro and pre-biotics every day without fail" Patient Questions/Concerns Addressed: Other: (facilitated prompt scheduling with PCP covering provider- 12/08/23)  Items Reviewed: Did you receive and understand the discharge instructions provided?: Yes (thoroughly reviewed with patient who verbalizes good understanding of same) Medications obtained,verified, and reconciled?: Yes (Medications Reviewed) (Full medication reconciliation/ review completed; no concerns or discrepancies identified; confirmed patient obtained/ is taking all newly Rx'd medications as instructed; self-manages medications and denies questions/ concerns around medications today) Any new allergies since your discharge?: No Dietary orders reviewed?: Yes Type of Diet Ordered:: Regular Do you have support at home?: Yes People in Home [RPT]: spouse Name of Support/Comfort Primary Source: Reports essentially independent in self-care activities; paid caregiver and  spouse assists as/ if needed/ indicated  Medications Reviewed Today: Medications Reviewed Today     Reviewed by Hiyab Nhem M, RN (Registered Nurse) on 12/03/23 at 1521  Med List Status: <None>   Medication Order Taking? Sig Documenting Provider Last Dose Status Informant  CALCIUM  PO [id number] Yes Take 1 tablet by mouth in the morning and at bedtime. [provider] Taking Active Self           Med Note Nolan Battle, [facility] I   Mon Nov 29, 2023 11:10 PM)    cefadroxil  (DURICEF) 500 MG capsule [id number] Yes Take 1 capsule (500 mg total) by mouth 2 (two) times daily. Barbee Lew, MD Taking Active   Chlorpheniramine-PSE-Ibuprofen  (ADVIL  ALLERGY SINUS PO) [id number] Yes Take 1 tablet by mouth daily. [provider] Taking Active Self  cholecalciferol  (VITAMIN D3) 25 MCG (1000 UNIT) tablet [id number] Yes Take 1,000 Units by mouth daily. [provider] Taking Active Self  cycloSPORINE  (RESTASIS ) 0.05 % ophthalmic emulsion [id number] Yes Place 1 drop into both eyes 4 (four) times daily as needed. [provider] Taking Active Self           Med Note Nolan Battle, [facility] I   Mon Nov 29, 2023 11:09 PM)    denosumab  (PROLIA ) 60 MG/ML SOSY injection [id number] Yes Pt to get at office.  Appt is 08/27/23 Jobe Mulder, DO Taking Active Self           Med Note Nolan Battle, [facility] I   Mon Nov 29, 2023 11:03 PM) Several Months  denosumab  (PROLIA ) injection 60 mg [id number]   Jobe Mulder, DO  Active   dexlansoprazole  (DEXILANT ) 60 MG capsule [id number] Yes Take 1 capsule (60 mg total) by mouth daily. Edmonia Gottron, PA-C Taking Active Self  diclofenac  Sodium (VOLTAREN ) 1 % GEL [id number] Yes Apply 2 g topically  4 (four) times daily.  Patient taking differently: Apply 2 g topically 4 (four) times daily as needed.   Sonnie Dusky, PA-C Taking Active Self  dicyclomine  (BENTYL ) 20 MG tablet [id number] Yes Take 20 mg by mouth in the morning and  at bedtime. [provider] Taking Active Self  famotidine  (PEPCID ) 20 MG tablet [id number] Yes Take 1 tablet (20 mg total) by mouth 2 (two) times daily. Edmonia Gottron, PA-C Taking Active Self  ferrous sulfate  325 (65 FE) MG tablet [id number] Yes Take 650 mg by mouth daily with breakfast. [provider] Taking Active Self           Med Note Joey Muster Oct 09, 2021 11:30 AM)    fludrocortisone  (FLORINEF ) 0.1 MG tablet [id number] Yes TAKE ONE TABLET BY MOUTH TWICE DAILY Jobe Mulder, DO Taking Active Self  GAMMAGARD 20 GM/200ML SOLN [id number] Yes Inject 40 g into the vein every 21 ( twenty-one) days. INFUSE 40G INTRAVENOUSLY EVERY 3 WEEKS [provider] Taking Active Self  KEVZARA 200 MG/1. SOAJ [id number] Yes Inject 1.14 mLs into the skin every 14 (fourteen) days. [provider] Taking Active Self           Med Note Thurman Flores   Sat Jun 26, 2023  3:14 PM)    levothyroxine  (SYNTHROID ) 75 MCG tablet [id number] Yes TAKE ONE (1) TABLET BY MOUTH EACH DAY BEFORE BREAKFAST ON EMPTY STOMACH Jobe Mulder, DO Taking Active Self  lidocaine  4 % [id number] Yes Place 1 patch onto the skin daily.  Patient taking differently: Place 1 patch onto the skin daily as needed.   Carin Charleston, MD Taking Active Self           Med Note Lajuana Pilar, Elodie Haines Sep 09, 2023  9:10 AM) Patient reports takes As needed  magnesium  oxide (MAG-OX) 400 MG tablet [id number] Yes Take 400 mg by mouth daily.  [provider] Taking Active Self  methylphenidate  (RITALIN ) 10 MG tablet [id number] Yes Take 1 tablet (10 mg total) by mouth daily.  Patient taking differently: Take 10 mg by mouth 2 (two) times daily with breakfast and lunch.   Liam Redhead, MD Taking Active Self  midodrine  (PROAMATINE ) 10 MG tablet [id number] Yes TAKE ONE (1) TABLET BY MOUTH 3 TIMES DAILY Jobe Mulder, DO Taking Active Self  MYRBETRIQ  50 MG TB24  tablet [id number] Yes Take 50 mg by mouth every evening. [provider] Taking Active Self  naloxone  (NARCAN ) nasal spray 4 mg/0.1 mL [id number] Yes Spray up nostril in event of opioid overdose. Jobe Mulder, DO Taking Active Self           Med Note Bud Care, SANDY L   Thu Sep 23, 2023  9:11 AM) Never used  Plecanatide  (TRULANCE ) 3 MG TABS [id number] Yes Take 1 tablet (3 mg total) by mouth daily. Edmonia Gottron, PA-C Taking Active Self           Med Note Nolan Battle, [place] I   Mon Nov 29, 2023 11:10 PM)    potassium chloride  (KLOR-CON ) 10 MEQ tablet [id number] Yes Take 1 tablet (10 mEq total) by mouth in the morning, at noon, in the evening, and at bedtime. Jobe Mulder, DO Taking Active Self  pregabalin  (LYRICA ) 50 MG capsule [id number] Yes Take 1 capsule (50 mg total) by mouth 3 (three) times daily. Jobe Mulder, DO Taking Active Self  pseudoephedrine (SUDAFED) 120 MG  12 hr tablet [id number] Yes Take 120 mg by mouth every 12 (twelve) hours as needed for congestion. [provider] Taking Active Self  pyridoxine (B-6) 100 MG tablet [id number] Yes Take 100 mg by mouth daily. Jobe Mulder, DO Taking Active Self  tamsulosin  (FLOMAX ) 0.4 MG CAPS capsule [id number] Yes Take 0.4 mg by mouth daily. [provider] Taking Active Self  torsemide  (DEMADEX ) 20 MG tablet [id number] Yes Take 20 mg by mouth 2 (two) times daily. [provider] Taking Active Self  triamcinolone  cream (KENALOG ) 0.1 % [id number] Yes Apply 1 application  topically 2 (two) times daily as needed (rash). [provider] Taking Active Self           Med Note Thurman Flores   Sat Jun 26, 2023  3:22 PM)    vancomycin  (VANCOCIN ) 125 MG capsule [id number] Yes Take 1 capsule (125 mg total) by mouth daily. Barbee Lew, MD Taking Active   Vitamin D , Ergocalciferol , (DRISDOL ) 1.25 MG (50000 UNIT) CAPS capsule [id number] Yes TAKE 1 CAPSULE ONCE WEEKLY  [name], Keven Pel, MD Taking Active Self  ZINC  OXIDE, TOPICAL, 10 % CREA [id number] Yes Apply 1 application topically 2 (two) times daily as needed (rash). Audria Leather, MD Taking Active Self           Med Note (CARD, AMY L   Tue Dec 22, 2022  6:44 PM) PRN           Home Care and Equipment/Supplies: Were Home Health Services Ordered?: No Any new equipment or medical supplies ordered?: No  Functional Questionnaire: Do you need assistance with bathing/showering or dressing?: Yes (husband/ paid caregiver supervises/ assists as indicated) Do you need assistance with meal preparation?: Yes (husband/ paid caregiver supervises/ assists as indicated) Do you need assistance with eating?: No Do you have difficulty maintaining continence: No Do you need assistance with getting out of bed/getting out of a chair/moving?: No Do you have difficulty managing or taking your medications?: No  Follow up appointments reviewed: PCP Follow-up appointment confirmed?: Yes (care coordination outreach in real-time with scheduling care guide to successfully schedule hospital follow up PCP appointment 12/08/23) Date of PCP follow-up appointment?: 12/08/23 Follow-up Provider: PCP covering provider- [facility] Follow-up appointment confirmed?: No Reason Specialist Follow-Up Not Confirmed: Patient has Specialist Provider Number and will Call for Appointment Do you need transportation to your follow-up appointment?: No Do you understand care options if your condition(s) worsen?: Yes-patient verbalized understanding  SDOH Interventions Today    Flowsheet Row Most Recent Value  SDOH Interventions   Food Insecurity Interventions Intervention Not Indicated  Housing Interventions Intervention Not Indicated  Transportation Interventions Intervention Not Indicated  Utilities Interventions Intervention Not Indicated      See TOC assessment tabs for additional assessment/ TOC intervention  information  Total time spent from review to signing of note/ including any care coordination interventions:  67 minutes: extensive medication review; very talkative pleasant patient with multiple questions  Pls call/ message for questions,  Geneva Pallas Mckinney Tremaine Fuhriman, RN, BSN, CCRN Alumnus RN Care Manager  Transitions of Care  [facility] [phone]: direct office

## 2023-12-04 ENCOUNTER — Other Ambulatory Visit: Payer: Self-pay

## 2023-12-04 ENCOUNTER — Encounter (HOSPITAL_COMMUNITY): Payer: Self-pay

## 2023-12-04 ENCOUNTER — Emergency Department (HOSPITAL_COMMUNITY)
Admission: EM | Admit: 2023-12-04 | Discharge: 2023-12-04 | Disposition: A | Discharge status: Home / Self Care | Attending: Emergency Medicine | Admitting: Emergency Medicine

## 2023-12-04 DIAGNOSIS — R531 Weakness: Secondary | ICD-10-CM | POA: Insufficient documentation

## 2023-12-04 DIAGNOSIS — R55 Syncope and collapse: Secondary | ICD-10-CM | POA: Insufficient documentation

## 2023-12-04 DIAGNOSIS — Z87440 Personal history of urinary (tract) infections: Secondary | ICD-10-CM | POA: Insufficient documentation

## 2023-12-04 DIAGNOSIS — R61 Generalized hyperhidrosis: Secondary | ICD-10-CM | POA: Diagnosis not present

## 2023-12-04 DIAGNOSIS — E161 Other hypoglycemia: Secondary | ICD-10-CM | POA: Diagnosis not present

## 2023-12-04 DIAGNOSIS — R197 Diarrhea, unspecified: Secondary | ICD-10-CM | POA: Diagnosis not present

## 2023-12-04 DIAGNOSIS — I959 Hypotension, unspecified: Secondary | ICD-10-CM | POA: Diagnosis not present

## 2023-12-04 LAB — COMPREHENSIVE METABOLIC PANEL WITH GFR
ALT: 51 U/L — ABNORMAL HIGH (ref 0–44)
AST: 69 U/L — ABNORMAL HIGH (ref 15–41)
Albumin: 2.9 g/dL — ABNORMAL LOW (ref 3.5–5.0)
Alkaline Phosphatase: 41 U/L (ref 38–126)
Anion gap: 9 (ref 5–15)
BUN: 19 mg/dL (ref 8–23)
CO2: 23 mmol/L (ref 22–32)
Calcium: 8.7 mg/dL — ABNORMAL LOW (ref 8.9–10.3)
Chloride: 110 mmol/L (ref 98–111)
Creatinine, Ser: 1.11 mg/dL — ABNORMAL HIGH (ref 0.44–1.00)
GFR, Estimated: 52 mL/min — ABNORMAL LOW (ref >60)
Glucose, Bld: 109 mg/dL — ABNORMAL HIGH (ref 70–99)
Potassium: 3.5 mmol/L (ref 3.5–5.1)
Sodium: 142 mmol/L (ref 135–145)
Total Bilirubin: 0.5 mg/dL (ref 0.0–1.2)
Total Protein: 6.2 g/dL — ABNORMAL LOW (ref 6.5–8.1)

## 2023-12-04 LAB — CBC WITH DIFFERENTIAL/PLATELET
Abs Immature Granulocytes: 0.01 K/uL (ref 0.00–0.07)
Basophils Absolute: 0 K/uL (ref 0.0–0.1)
Basophils Relative: 1 %
Eosinophils Absolute: 0 K/uL (ref 0.0–0.5)
Eosinophils Relative: 1 %
HCT: 37.6 % (ref 36.0–46.0)
Hemoglobin: 12.3 g/dL (ref 12.0–15.0)
Immature Granulocytes: 0 %
Lymphocytes Relative: 22 %
Lymphs Abs: 0.8 K/uL (ref 0.7–4.0)
MCH: 30.7 pg (ref 26.0–34.0)
MCHC: 32.7 g/dL (ref 30.0–36.0)
MCV: 93.8 fL (ref 80.0–100.0)
Monocytes Absolute: 0.3 K/uL (ref 0.1–1.0)
Monocytes Relative: 8 %
Neutro Abs: 2.4 K/uL (ref 1.7–7.7)
Neutrophils Relative %: 68 %
Platelets: 96 K/uL — ABNORMAL LOW (ref 150–400)
RBC: 4.01 MIL/uL (ref 3.87–5.11)
RDW: 13.2 % (ref 11.5–15.5)
WBC: 3.6 K/uL — ABNORMAL LOW (ref 4.0–10.5)
nRBC: 0 % (ref 0.0–0.2)

## 2023-12-04 LAB — C DIFFICILE QUICK SCREEN W PCR REFLEX
C Diff antigen: NEGATIVE
C Diff interpretation: NOT DETECTED
C Diff toxin: NEGATIVE

## 2023-12-04 LAB — CBG MONITORING, ED: Glucose-Capillary: 100 mg/dL — ABNORMAL HIGH (ref 70–99)

## 2023-12-04 MED ORDER — CEFADROXIL 500 MG PO CAPS: 500.0000 mg | ORAL_CAPSULE | Freq: Two times a day (BID) | ORAL | Status: DC

## 2023-12-04 MED ORDER — HEPARIN SOD (PORK) LOCK FLUSH 100 UNIT/ML IV SOLN
500.0000 [IU] | Freq: Once | INTRAVENOUS | Status: AC
  Administered 2023-12-04: 500 [IU]
  Filled 2023-12-04: qty 5

## 2023-12-04 MED ORDER — TORSEMIDE 20 MG PO TABS
20.0000 mg | ORAL_TABLET | Freq: Every day | ORAL | Status: DC
  Administered 2023-12-04: 20 mg via ORAL
  Filled 2023-12-04: qty 1

## 2023-12-04 MED ORDER — SODIUM CHLORIDE 0.9 % IV BOLUS
500.0000 mL | Freq: Once | INTRAVENOUS | Status: AC
  Administered 2023-12-04: 500 mL via INTRAVENOUS

## 2023-12-04 MED ORDER — VANCOMYCIN HCL 125 MG PO CAPS: 125.0000 mg | ORAL_CAPSULE | Freq: Every day | ORAL | Status: DC

## 2023-12-04 MED ORDER — MIDODRINE HCL 5 MG PO TABS
10.0000 mg | ORAL_TABLET | Freq: Once | ORAL | Status: DC
  Filled 2023-12-04: qty 2

## 2023-12-04 NOTE — ED Provider Notes (Signed)
 [place] EMERGENCY DEPARTMENT AT [facility] Provider Note   CSN: [id number] Arrival date & time: 12/04/23  1134     History  Chief Complaint  Patient presents with   Weakness    Megan Brewer is a 78 y.o. female.  Patient to ED after onset of profound weakness while on the toilet for a bowel movement. She was recently admitted for a urinary tract infection with history of ESBL UTI, discharged 2 days ago. Urine culture positive for E. Coli and she was discharged on Cefazolin as well as oral Vancomycin  as a C. diff preventive as her diarrhea started on day of discharge. Today, she has had 3 bowel movements, at 2:00 am, 4:00 am and just prior to arrival during which the bowel movement went on for 45 minutes. She felt weak, with "brown over eyes" and was too weak to be up to her wheelchair. No fall, no fever. No bloody stools. She feels some better on arrival. EMS reported hypotension on the scene.   The history is provided by the patient. No language interpreter was used.  Weakness      Home Medications Prior to Admission medications   Medication Sig Start Date End Date Taking? Authorizing Provider  CALCIUM  PO Take 1 tablet by mouth in the morning and at bedtime.    [provider]  cefadroxil  (DURICEF) 500 MG capsule Take 1 capsule (500 mg total) by mouth 2 (two) times daily. 12/02/23   Barbee Lew, MD  Chlorpheniramine-PSE-Ibuprofen  (ADVIL  ALLERGY SINUS PO) Take 1 tablet by mouth daily.    [provider]  cholecalciferol  (VITAMIN D3) 25 MCG (1000 UNIT) tablet Take 1,000 Units by mouth daily.    [provider]  cycloSPORINE  (RESTASIS ) 0.05 % ophthalmic emulsion Place 1 drop into both eyes 4 (four) times daily as needed.    [provider]  denosumab  (PROLIA ) 60 MG/ML SOSY injection Pt to get at office.  Appt is 08/27/23 07/14/23   Jobe Mulder, DO  dexlansoprazole  (DEXILANT ) 60 MG capsule Take 1 capsule (60 mg  total) by mouth daily. 07/26/23   Edmonia Gottron, PA-C  diclofenac  Sodium (VOLTAREN ) 1 % GEL Apply 2 g topically 4 (four) times daily. Patient taking differently: Apply 2 g topically 4 (four) times daily as needed. 07/27/23   Sonnie Dusky, PA-C  dicyclomine  (BENTYL ) 20 MG tablet Take 20 mg by mouth in the morning and at bedtime. 09/04/23   [provider]  famotidine  (PEPCID ) 20 MG tablet Take 1 tablet (20 mg total) by mouth 2 (two) times daily. 09/20/23   Edmonia Gottron, PA-C  ferrous sulfate  325 (65 FE) MG tablet Take 650 mg by mouth daily with breakfast.    [provider]  fludrocortisone  (FLORINEF ) 0.1 MG tablet TAKE ONE TABLET BY MOUTH TWICE DAILY 11/22/23   Wendling, Shellie Dials, DO  GAMMAGARD 20 GM/200ML SOLN Inject 40 g into the vein every 21 ( twenty-one) days. INFUSE 40G INTRAVENOUSLY EVERY 3 WEEKS 02/27/22   [provider]  KEVZARA 200 MG/1. SOAJ Inject 1.14 mLs into the skin every 14 (fourteen) days. 09/09/22   [provider]  levothyroxine  (SYNTHROID ) 75 MCG tablet TAKE ONE (1) TABLET BY MOUTH EACH DAY BEFORE BREAKFAST ON EMPTY STOMACH 07/19/23   Jobe Mulder, DO  lidocaine  4 % Place 1 patch onto the skin daily. Patient taking differently: Place 1 patch onto the skin daily as needed. 07/08/23   Carin Charleston, MD  magnesium   oxide (MAG-OX) 400 MG tablet Take 400 mg by mouth daily.     [provider]  methylphenidate  (RITALIN ) 10 MG tablet Take 1 tablet (10 mg total) by mouth daily. Patient taking differently: Take 10 mg by mouth 2 (two) times daily with breakfast and lunch. 09/15/23 09/14/24  [name], Keven Pel, MD  midodrine  (PROAMATINE ) 10 MG tablet TAKE ONE (1) TABLET BY MOUTH 3 TIMES DAILY 08/09/23   Gwenette Lennox, Shellie Dials, DO  MYRBETRIQ  50 MG TB24 tablet Take 50 mg by mouth every evening. 10/07/22   [provider]  naloxone  (NARCAN ) nasal spray 4 mg/0.1 mL Spray up nostril in event of opioid overdose.  06/28/23   Jobe Mulder, DO  Plecanatide  (TRULANCE ) 3 MG TABS Take 1 tablet (3 mg total) by mouth daily. 09/20/23 12/19/23  Collier, Amanda R, PA-C  potassium chloride  (KLOR-CON ) 10 MEQ tablet Take 1 tablet (10 mEq total) by mouth in the morning, at noon, in the evening, and at bedtime. 11/01/23   Jobe Mulder, DO  pregabalin  (LYRICA ) 50 MG capsule Take 1 capsule (50 mg total) by mouth 3 (three) times daily. 07/22/23   Jobe Mulder, DO  pseudoephedrine (SUDAFED) 120 MG 12 hr tablet Take 120 mg by mouth every 12 (twelve) hours as needed for congestion.    [provider]  pyridoxine (B-6) 100 MG tablet Take 100 mg by mouth daily.    Jobe Mulder, DO  tamsulosin  (FLOMAX ) 0.4 MG CAPS capsule Take 0.4 mg by mouth daily. 11/25/23   [provider]  torsemide  (DEMADEX ) 20 MG tablet Take 20 mg by mouth 2 (two) times daily.    [provider]  triamcinolone  cream (KENALOG ) 0.1 % Apply 1 application  topically 2 (two) times daily as needed (rash).    [provider]  vancomycin  (VANCOCIN ) 125 MG capsule Take 1 capsule (125 mg total) by mouth daily. 12/03/23   Barbee Lew, MD  Vitamin D , Ergocalciferol , (DRISDOL ) 1.25 MG (50000 UNIT) CAPS capsule TAKE 1 CAPSULE ONCE WEEKLY 01/04/23   [name], Keven Pel, MD  ZINC  OXIDE, TOPICAL, 10 % CREA Apply 1 application topically 2 (two) times daily as needed (rash). 02/14/21   Audria Leather, MD      Allergies    Butorphanol, Erythromycin base, Propoxyphene, Sumatriptan , Tetracycline, Ciprofloxacin, Corticosteroids, Doxycycline , Cefixime, Isometheptene-dichloral-apap, Ketorolac , Prednisone , Promethazine , and Tape    Review of Systems   Review of Systems  Neurological:  Positive for weakness.    Physical Exam Updated Vital Signs BP (!) 110/55   Pulse 66   Temp 98.4 F (36.9 C) (Oral)   Resp 14   Ht 5\' 4"  (1.626 m)   Wt 57.7 kg   SpO2 97%   BMI 21.83 kg/m  Physical Exam Vitals  and nursing note reviewed.  Constitutional:      Appearance: She is well-developed.  HENT:     Head: Normocephalic.     Mouth/Throat:     Mouth: Mucous membranes are dry.  Cardiovascular:     Rate and Rhythm: Normal rate and regular rhythm.     Heart sounds: No murmur heard. Pulmonary:     Effort: Pulmonary effort is normal.     Breath sounds: Normal breath sounds. No wheezing, rhonchi or rales.  Abdominal:     General: Bowel sounds are normal. There is no distension.     Palpations: Abdomen is soft.     Tenderness: There is no abdominal tenderness. There is no guarding or rebound.  Musculoskeletal:  General: Normal range of motion.     Cervical back: Normal range of motion and neck supple.  Skin:    General: Skin is warm and dry.  Neurological:     General: No focal deficit present.     Mental Status: She is alert and oriented to person, place, and time.     ED Results / Procedures / Treatments   Labs (all labs ordered are listed, but only abnormal results are displayed) Labs Reviewed  CBC WITH DIFFERENTIAL/PLATELET - Abnormal; Notable for the following components:      Result Value   WBC 3.6 (*)    Platelets 96 (*)    All other components within normal limits  COMPREHENSIVE METABOLIC PANEL WITH GFR - Abnormal; Notable for the following components:   Glucose, Bld 109 (*)    Creatinine, Ser 1.11 (*)    Calcium  8.7 (*)    Total Protein 6.2 (*)    Albumin  2.9 (*)    AST 69 (*)    ALT 51 (*)    GFR, Estimated 52 (*)    All other components within normal limits  CBG MONITORING, ED - Abnormal; Notable for the following components:   Glucose-Capillary 100 (*)    All other components within normal limits  C DIFFICILE QUICK SCREEN W PCR REFLEX    GASTROINTESTINAL PANEL BY PCR, STOOL (REPLACES STOOL CULTURE)  URINALYSIS, ROUTINE W REFLEX MICROSCOPIC   Results for orders placed or performed during the hospital encounter of 12/04/23  C Difficile Quick Screen w PCR  reflex   Collection Time: 12/04/23 12:50 PM   Specimen: Stool  Result Value Ref Range   C Diff antigen NEGATIVE NEGATIVE   C Diff toxin NEGATIVE NEGATIVE   C Diff interpretation No C. difficile detected.   CBG monitoring, ED   Collection Time: 12/04/23 12:52 PM  Result Value Ref Range   Glucose-Capillary 100 (H) 70 - 99 mg/dL  CBC with Differential   Collection Time: 12/04/23  2:05 PM  Result Value Ref Range   WBC 3.6 (L) 4.0 - 10.5 K/uL   RBC 4.01 3.87 - 5.11 MIL/uL   Hemoglobin 12.3 12.0 - 15.0 g/dL   HCT 16.1 09.6 - 04.5 %   MCV 93.8 80.0 - 100.0 fL   MCH 30.7 26.0 - 34.0 pg   MCHC 32.7 30.0 - 36.0 g/dL   RDW 40.9 81.1 - 91.4 %   Platelets 96 (L) 150 - 400 K/uL   nRBC 0.0 0.0 - 0.2 %   Neutrophils Relative % 68 %   Neutro Abs 2.4 1.7 - 7.7 K/uL   Lymphocytes Relative 22 %   Lymphs Abs 0.8 0.7 - 4.0 K/uL   Monocytes Relative 8 %   Monocytes Absolute 0.3 0.1 - 1.0 K/uL   Eosinophils Relative 1 %   Eosinophils Absolute 0.0 0.0 - 0.5 K/uL   Basophils Relative 1 %   Basophils Absolute 0.0 0.0 - 0.1 K/uL   Immature Granulocytes 0 %   Abs Immature Granulocytes 0.01 0.00 - 0.07 K/uL  Comprehensive metabolic panel   Collection Time: 12/04/23  2:05 PM  Result Value Ref Range   Sodium 142 135 - 145 mmol/L   Potassium 3.5 3.5 - 5.1 mmol/L   Chloride 110 98 - 111 mmol/L   CO2 23 22 - 32 mmol/L   Glucose, Bld 109 (H) 70 - 99 mg/dL   BUN 19 8 - 23 mg/dL   Creatinine, Ser 7.82 (H) 0.44 - 1.00 mg/dL  Calcium  8.7 (L) 8.9 - 10.3 mg/dL   Total Protein 6.2 (L) 6.5 - 8.1 g/dL   Albumin  2.9 (L) 3.5 - 5.0 g/dL   AST 69 (H) 15 - 41 U/L   ALT 51 (H) 0 - 44 U/L   Alkaline Phosphatase 41 38 - 126 U/L   Total Bilirubin 0.5 0.0 - 1.2 mg/dL   GFR, Estimated 52 (L) >60 mL/min   Anion gap 9 5 - 15     EKG EKG Interpretation Date/Time:  Saturday December 04 2023 11:52:43 EDT Ventricular Rate:  101 PR Interval:  152 QRS Duration:  134 QT Interval:  398 QTC Calculation: 516 R  Axis:   46  Text Interpretation: Sinus tachycardia Left bundle branch block Confirmed by Angela Kell [phone]) on 12/04/2023 1:17:25 PM  Radiology No results found.  Procedures Procedures    Medications Ordered in ED Medications  sodium chloride  0.9 % bolus 500 mL (has no administration in time range)  torsemide  (DEMADEX ) tablet 20 mg (has no administration in time range)  sodium chloride  0.9 % bolus 500 mL (500 mLs Intravenous New Bag/Given 12/04/23 1432)    ED Course/ Medical Decision Making/ A&P                                 Medical Decision Making This patient presents to the ED for concern of weakness, near syncope, this involves an extensive number of treatment options, and is a complaint that carries with it a high risk of complications and morbidity.  The differential diagnosis includes arrhythmia, dehydration, sepsis/infection, vasovagal    Co morbidities that complicate the patient evaluation  Recent admission with UTI, discharged 2 days ago H/O hypogammaglobulinemia, CKD, GERD, bipolar, arachnoiditis, osteoporosis, hypotension, anemia, IgG deficiency, polymyalgia rheumatica,    Additional history obtained:  Additional history and/or information obtained from chart review, notable for Discharge summary 4/17   Lab Tests:  I Ordered, and personally interpreted labs.  The pertinent results include:  C diff negative    Imaging Studies ordered:  I ordered imaging studies including n/a I independently visualized and interpreted imaging which showed n/a I agree with the radiologist interpretation   Cardiac Monitoring:  The patient was maintained on a cardiac monitor.  I personally viewed and interpreted the cardiac monitored which showed an underlying rhythm of: n/a   Test Considered:  N/a   Critical Interventions:  N/a   Consultations Obtained:  I requested consultation with the  n/a,  and discussed lab and imaging findings as well as pertinent  plan - they recommend: n/a   Problem List / ED Course:  Patient to ED with diarrhea since discharge from hospital 4/17 - on prophylactic vanc for C diff. C diff negative today.  Had a prolonged episode on the toilet with diarrhea and became symptomatic - likely vasovagal  Reported hypotensive on scene by EMS at 80/50 - better here No pain She is feeling baseline since arrival - BP now 110/55 UA pending collection - she requests Torsemide  dose which is provided  Shared decision making. She is feeling much better. Would like to go home. I feel urine check is appropriate - will wait for that. Plan for discharge if not significantly abnormal in the setting of recent hospitalization for UTI and current abx for UTI.     Reevaluation:  After the interventions noted above, I reevaluated the patient and found that they have :improved   Social  Determinants of Health:  Lives with husband   Disposition:  After consideration of the diagnostic results and the patients response to treatment, I feel that the patient would benefit from: disposition pending for UA.   Patient care signed out to [facility], PA-C to review urine. Anticipate discharge home. .   Amount and/or Complexity of Data Reviewed Labs: ordered.  Risk Prescription drug management.           Final Clinical Impression(s) / ED Diagnoses Final diagnoses:  Vasovagal episode    Rx / DC Orders ED Discharge Orders     None         Mandy Second, PA-C 12/04/23 1527    Burnette Carte, MD 12/07/23 [phone]

## 2023-12-04 NOTE — Discharge Instructions (Addendum)
 As we discussed, your weakness episode today is felt to be related to a drop in blood pressure after your prolonged bowel movement earlier today. You have improved blood pressure in the emergency department and labs are reassuring.   Continue your prescribed medications and follow up with your doctor as planned.

## 2023-12-04 NOTE — ED Triage Notes (Signed)
 Pt BIB [facility] from home. Pt presents with increased weakness and diarrhea. Pt had a recent admission to the hospital for UTI and was discharged on oral abx. 24 hours after starting the abx is when the diarrhea started. Pt has been tolerating PO fluids well.   EMS report initial BP of 80/50 and a CBG of 63. They admin 10 gm of D10 and 150 mL of NaCl. Pt then requested to have IV removed.   EMS Vitals  CBG 84 130/90 RR 20 HR 90 with LBBB on EKG SpO2 99% on R/A

## 2023-12-04 NOTE — ED Provider Notes (Signed)
 Care assumed from previous provider.  See note for full HPI.  Plan on follow-up on UA, anticipate DC home Physical Exam  BP 116/61   Pulse 71   Temp 97.6 F (36.4 C) (Oral)   Resp 14   Ht 5\' 4"  (1.626 m)   Wt 57.7 kg   SpO2 100%   BMI 21.83 kg/m   Physical Exam Vitals and nursing note reviewed.  Constitutional:      General: She is not in acute distress.    Appearance: She is well-developed. She is not ill-appearing or diaphoretic.  HENT:     Head: Atraumatic.  Eyes:     Pupils: Pupils are equal, round, and reactive to light.  Cardiovascular:     Rate and Rhythm: Normal rate and regular rhythm.  Pulmonary:     Effort: Pulmonary effort is normal. No respiratory distress.  Abdominal:     General: There is no distension.     Palpations: Abdomen is soft.  Musculoskeletal:        General: Normal range of motion.     Cervical back: Normal range of motion and neck supple.  Skin:    General: Skin is warm and dry.  Neurological:     General: No focal deficit present.     Mental Status: She is alert and oriented to person, place, and time.     Procedures  Procedures Labs Reviewed  CBC WITH DIFFERENTIAL/PLATELET - Abnormal; Notable for the following components:      Result Value   WBC 3.6 (*)    Platelets 96 (*)    All other components within normal limits  COMPREHENSIVE METABOLIC PANEL WITH GFR - Abnormal; Notable for the following components:   Glucose, Bld 109 (*)    Creatinine, Ser 1.11 (*)    Calcium  8.7 (*)    Total Protein 6.2 (*)    Albumin  2.9 (*)    AST 69 (*)    ALT 51 (*)    GFR, Estimated 52 (*)    All other components within normal limits  CBG MONITORING, ED - Abnormal; Notable for the following components:   Glucose-Capillary 100 (*)    All other components within normal limits  C DIFFICILE QUICK SCREEN W PCR REFLEX    GASTROINTESTINAL PANEL BY PCR, STOOL (REPLACES STOOL CULTURE)  URINALYSIS, ROUTINE W REFLEX MICROSCOPIC   DG Abd 1 View Result  Date: 12/02/2023 CLINICAL DATA:  Pain. EXAM: ABDOMEN - 1 VIEW COMPARISON:  04/04/2020 FINDINGS: No gaseous small bowel or colonic dilatation to suggest obstruction. Surgical staple lines noted right abdomen. Battery packs for stimulator device is overlie the mid abdomen bilaterally, as before. Bones are diffusely demineralized. IMPRESSION: Nonobstructive bowel gas pattern. Electronically Signed   By: Donnal Fusi M.D.   On: 12/02/2023 12:23   CT ABDOMEN PELVIS W CONTRAST Result Date: 11/29/2023 CLINICAL DATA:  Acute abdominal pain and hypotension EXAM: CT ABDOMEN AND PELVIS WITH CONTRAST TECHNIQUE: Multidetector CT imaging of the abdomen and pelvis was performed using the standard protocol following bolus administration of intravenous contrast. RADIATION DOSE REDUCTION: This exam was performed according to the departmental dose-optimization program which includes automated exposure control, adjustment of the mA and/or kV according to patient size and/or use of iterative reconstruction technique. CONTRAST:  80mL OMNIPAQUE  IOHEXOL  300 MG/ML  SOLN COMPARISON:  07/08/2023 FINDINGS: Lower chest: Stable scarring in the right lower lobe is seen. Visualized lung base on the left is unremarkable. Hepatobiliary: Gallbladder has been surgically removed. Biliary ductal dilatation is  noted consistent with the post cholecystectomy state. The liver is otherwise within normal limits. Pancreas: Unremarkable. No pancreatic ductal dilatation or surrounding inflammatory changes. Spleen: Normal in size without focal abnormality. Adrenals/Urinary Tract: Adrenal glands are within normal limits. Kidneys demonstrate a normal enhancement pattern bilaterally. No obstructive changes are seen. The bladder is within normal limits. Stomach/Bowel: No obstructive or inflammatory changes of the colon are seen. The appendix is not well visualized and may have been surgically removed. Small bowel shows no obstructive change. A transient appearing  intussusception is noted in the left abdomen near a small bowel anastomosis site likely related to the prior gastric bypass. Postsurgical changes in the stomach are noted. A portion of the stomach extends into the chest and is stable. Vascular/Lymphatic: Aortic atherosclerosis. No enlarged abdominal or pelvic lymph nodes. Reproductive: Status post hysterectomy. No adnexal masses. Other: No abdominal wall hernia or abnormality. No abdominopelvic ascites. Musculoskeletal: InterStim device is noted on the left. Spinal stimulator is noted. No acute bony abnormality is seen. IMPRESSION: Postsurgical changes consistent with the given clinical history. Likely transient intussusception in the small bowel of the left abdomen. No complicating factors are identified in this is likely related to the prior small bowel anastomosis. No other focal abnormality is noted. Electronically Signed   By: Violeta Grey M.D.   On: 11/29/2023 21:44   DG Chest Portable 1 View Result Date: 11/29/2023 CLINICAL DATA:  Shortness of breath, rib pain EXAM: PORTABLE CHEST 1 VIEW COMPARISON:  05/20/2023 FINDINGS: Unchanged distribution of dorsal column stimulator leads. Power injectable Port-A-Cath tip: Cavoatrial junction. Heart size within normal limits. The patient is rotated to the right on today's radiograph, reducing diagnostic sensitivity and specificity. Right lower lobectomy with chronic retrocardiac opacity on the right, and old right rib deformities. IMPRESSION: 1. No acute findings. 2. Right lower lobectomy with chronic retrocardiac opacity on the right. 3. Old right rib deformities. Electronically Signed   By: Freida Jes M.D.   On: 11/29/2023 19:53   US  ABDOMEN LIMITED RUQ (LIVER/GB) Result Date: 11/13/2023 CLINICAL DATA:  Elevated LFT EXAM: ULTRASOUND ABDOMEN LIMITED RIGHT UPPER QUADRANT COMPARISON:  July 08, 2023 FINDINGS: Gallbladder: Surgically absent Common bile duct: Diameter: Visualized portion measures 11 mm,  dilated. This is similar comparison to prior CT. Liver: No focal lesion identified. Mildly heterogeneous but within normal limits in parenchymal echogenicity. Portal vein is patent on color Doppler imaging with normal direction of blood flow towards the liver. Other: None. IMPRESSION: Similar dilatation of the common bile duct in this patient status post cholecystectomy. Recommend correlation with LFTs. If there is a clinical concern for biliary obstruction, then recommend dedicated MRI with and without contrast with MRCP. Electronically Signed   By: Clancy Crimes M.D.   On: 11/13/2023 12:31    ED Course / MDM   Clinical Course as of 12/04/23 1927  Sat Dec 04, 2023  1532 Dc 2 days ago for UTI. Started having diarrhea prior to dc. On toilet for 45 min, near syncope. BP improved here. Comfortable with going home. FU on UA. On oral meds for UTI and vanc for poss Cdiff however neg here [BH]    Clinical Course User Index [BH] Dillin Lofgren A, PA-C  Care assumed from previous of Clinical research associate.  See note for full HPI.  Plan follow-up on UA, anticipate DC home suspect vasovagal near syncope.  Labs and imaging personally viewed interpreted  Check on patient.  She would like to go home.  Apparently urinalysis has been lost x  2 after patient provided sample here in the ED.  She does not want to wait to provide a third sample.  She is requesting DC home which I feel is reasonable.  She initially had requested her antibiotics and midodrine  as she has missed this afternoon's dose however at this time she would just like to go home and take her medicines there.  She has no hypotension here.  Tolerating p.o. intake.  The patient has been appropriately medically screened and/or stabilized in the ED. I have low suspicion for any other emergent medical condition which would require further screening, evaluation or treatment in the ED or require inpatient management.  Patient is hemodynamically stable and in no acute  distress.  Patient able to ambulate in department prior to ED.  Evaluation does not show acute pathology that would require ongoing or additional emergent interventions while in the emergency department or further inpatient treatment.  I have discussed the diagnosis with the patient and answered all questions.  Pain is been managed while in the emergency department and patient has no further complaints prior to discharge.  Patient is comfortable with plan discussed in room and is stable for discharge at this time.  I have discussed strict return precautions for returning to the emergency department.  Patient was encouraged to follow-up with PCP/specialist refer to at discharge.    Medical Decision Making Amount and/or Complexity of Data Reviewed Independent Historian: spouse External Data Reviewed: labs, radiology and notes. Labs: ordered. Decision-making details documented in ED Course. ECG/medicine tests: ordered and independent interpretation performed. Decision-making details documented in ED Course.  Risk OTC drugs. Prescription drug management. Parenteral controlled substances. Decision regarding hospitalization. Diagnosis or treatment significantly limited by social determinants of health.          Ulonda Klosowski A, PA-C 12/04/23 Mason Sole, MD 12/05/23 1505

## 2023-12-04 NOTE — ED Notes (Signed)
 Upon chart review it was noted pt was sent home on cefadroxil  for complicated UTI. Pt also has a hx of c. Diff so she was placed on prophylactic treatment of PO vanc.

## 2023-12-04 NOTE — ED Notes (Signed)
 This nurse heparin  flushed and deaccessed patient's port for discharge. Patient tolerated well.

## 2023-12-05 LAB — GASTROINTESTINAL PANEL BY PCR, STOOL (REPLACES STOOL CULTURE)

## 2023-12-07 ENCOUNTER — Encounter: Attending: Physical Medicine and Rehabilitation | Admitting: Physical Medicine and Rehabilitation

## 2023-12-07 ENCOUNTER — Encounter: Payer: Self-pay | Admitting: Physical Medicine and Rehabilitation

## 2023-12-07 VITALS — BP 88/60 | HR 86 | Ht 64.0 in

## 2023-12-07 DIAGNOSIS — N3020 Other chronic cystitis without hematuria: Secondary | ICD-10-CM | POA: Diagnosis not present

## 2023-12-07 DIAGNOSIS — M7912 Myalgia of auxiliary muscles, head and neck: Secondary | ICD-10-CM

## 2023-12-07 DIAGNOSIS — M7918 Myalgia, other site: Secondary | ICD-10-CM | POA: Insufficient documentation

## 2023-12-07 DIAGNOSIS — M62838 Other muscle spasm: Secondary | ICD-10-CM | POA: Insufficient documentation

## 2023-12-07 MED ORDER — LIDOCAINE HCL 1 % IJ SOLN
5.0000 mL | Freq: Once | INTRAMUSCULAR | Status: AC
  Administered 2023-12-07: 5 mL via INTRADERMAL

## 2023-12-07 NOTE — Progress Notes (Signed)
Trigger Point Injection  Indication: Cervical myofascial pain not relieved by medication management and other conservative care.  Informed consent was obtained after describing risk and benefits of the procedure with the patient, this includes bleeding, bruising, infection and medication side effects.  The patient wishes to proceed and has given written consent.  The patient was placed in a seated position.  The area of pain was marked and prepped with Betadine.  It was entered with a 25-gauge 1/2 inch needle and a total of 5 mL of 1% lidocaine and normal saline was injected into a total of 10 trigger points, after negative draw back for blood.  The patient tolerated the procedure well.  Post procedure instructions were given.  

## 2023-12-08 ENCOUNTER — Encounter: Payer: Self-pay | Admitting: Medical

## 2023-12-08 ENCOUNTER — Ambulatory Visit: Admitting: Medical

## 2023-12-08 VITALS — BP 100/65 | HR 84 | Resp 18 | Ht 64.0 in

## 2023-12-08 DIAGNOSIS — R197 Diarrhea, unspecified: Secondary | ICD-10-CM | POA: Diagnosis not present

## 2023-12-08 DIAGNOSIS — R944 Abnormal results of kidney function studies: Secondary | ICD-10-CM | POA: Diagnosis not present

## 2023-12-08 DIAGNOSIS — Z87440 Personal history of urinary (tract) infections: Secondary | ICD-10-CM

## 2023-12-08 LAB — CBC WITH DIFFERENTIAL/PLATELET
Basophils Absolute: 0 10*3/uL (ref 0.0–0.1)
Basophils Relative: 1 % (ref 0.0–3.0)
Eosinophils Absolute: 0 10*3/uL (ref 0.0–0.7)
Eosinophils Relative: 0.9 % (ref 0.0–5.0)
HCT: 41.7 % (ref 36.0–46.0)
Hemoglobin: 13.8 g/dL (ref 12.0–15.0)
Lymphocytes Relative: 24.4 % (ref 12.0–46.0)
Lymphs Abs: 1.2 10*3/uL (ref 0.7–4.0)
MCHC: 33.2 g/dL (ref 30.0–36.0)
MCV: 92.7 fl (ref 78.0–100.0)
Monocytes Absolute: 0.4 10*3/uL (ref 0.1–1.0)
Monocytes Relative: 7.9 % (ref 3.0–12.0)
Neutro Abs: 3.1 10*3/uL (ref 1.4–7.7)
Neutrophils Relative %: 65.8 % (ref 43.0–77.0)
Platelets: 137 10*3/uL — ABNORMAL LOW (ref 150.0–400.0)
RBC: 4.49 Mil/uL (ref 3.87–5.11)
RDW: 13.8 % (ref 11.5–15.5)
WBC: 4.7 10*3/uL (ref 4.0–10.5)

## 2023-12-08 LAB — POC URINALSYSI DIPSTICK (AUTOMATED)
Bilirubin, UA: NEGATIVE
Blood, UA: NEGATIVE
Glucose, UA: NEGATIVE
Ketones, UA: NEGATIVE
Nitrite, UA: NEGATIVE
Protein, UA: NEGATIVE
Spec Grav, UA: <=1.005 — AB (ref 1.010–1.025)
Urobilinogen, UA: 0.2 U/dL
pH, UA: 6 (ref 5.0–8.0)

## 2023-12-08 LAB — COMPREHENSIVE METABOLIC PANEL WITH GFR
ALT: 48 U/L — ABNORMAL HIGH (ref 0–35)
AST: 53 U/L — ABNORMAL HIGH (ref 0–37)
Albumin: 3.7 g/dL (ref 3.5–5.2)
Alkaline Phosphatase: 58 U/L (ref 39–117)
BUN: 25 mg/dL — ABNORMAL HIGH (ref 6–23)
CO2: 28 meq/L (ref 19–32)
Calcium: 9 mg/dL (ref 8.4–10.5)
Chloride: 101 meq/L (ref 96–112)
Creatinine, Ser: 1.59 mg/dL — ABNORMAL HIGH (ref 0.40–1.20)
GFR: 31.61 mL/min — ABNORMAL LOW (ref >60.00)
Glucose, Bld: 101 mg/dL — ABNORMAL HIGH (ref 70–99)
Potassium: 4.1 meq/L (ref 3.5–5.1)
Sodium: 138 meq/L (ref 135–145)
Total Bilirubin: 0.4 mg/dL (ref 0.2–1.2)
Total Protein: 6.8 g/dL (ref 6.0–8.3)

## 2023-12-08 NOTE — Progress Notes (Signed)
 Subjective:    Patient ID: Megan Brewer, female    DOB: 09-05-1945, 78 y.o.   MRN: [id number]  HPI  Megan Brewer is a 78 year old female with chronic cystitis who presents with symptoms following a recent hospitalization for a urinary tract infection.  She was admitted to the hospital on April 14th for a urinary tract infection, initially diagnosed with low blood pressure secondary to the UTI. The infection was caused by a significant bacterial load, with over 100,000 bacteria present. She was discharged on April 17th but returned to the emergency department on April 19th due to ongoing symptoms.  She has difficulty recognizing UTI symptoms, noting that fatigue is often her only symptom. In a previous UTI, she noticed burgundy-colored urine without pain or swelling. This time, she experienced a sensation like a 'hypodermic needle' about ten days before seeing the urologist. On the day of her urologist appointment, she felt suddenly weak, as if 'somebody yanked the rug out from under me,' and was unable to get a blood pressure reading at the urologist's office, leading to her hospital admission.  She has a history of chronic cystitis and chronic back pain since age 21, which complicates her ability to discern flank pain. Her normal blood pressure is typically low, around 90-100/60-80, and she is on fludrocortisone  and midodrine  to manage it. Her blood pressure medications work 'sort of,' and she and her husband do not get concerned until her blood pressure drops to about 50/35.  During her hospital stay, she was placed on two antibiotics, vancomycin  and Duricef (a cephalosporin), despite her history of C. diff. She is prone to C. diff and has previously spent a month in the hospital due to it. She completed vancomycin  today and will finish Duricef tomorrow. She reduced the dosage of Duricef due to concerns about C. diff, taking it once a day instead of twice.  Since discharge, she has  experienced diarrhea, requiring Imodium to manage symptoms. She had two episodes of diarrhea this morning, each lasting about an hour, and describes the stool as 'more solid than watery, very soft, and a lot.' She has a history of passing out  4 days ago due to weakness and low blood pressure, which occurred again recently when she was unable to sit up or hold her head up, leading to a vague memory of being moved onto a stretcher.   After ED evaluation on 12-04-23 followed up with urologist. She attempted to provide a urine sample at the urologist's office yesterday but was unsuccessful despite drinking significant fluids. She is concerned about her ability to provide a urine sample today despite taking fluid pills and drinking 32 ounces of fluid.  Review of Systems  Constitutional:  Negative for chills, fatigue and fever.  Respiratory:  Negative for chest tightness, shortness of breath and wheezing.   Cardiovascular:  Negative for chest pain and palpitations.  Gastrointestinal:  Negative for abdominal distention, abdominal pain, blood in stool, diarrhea and nausea.       See hpi.  Genitourinary:  Negative for dysuria, frequency and urgency.       See hpi. No current symptoms.  Musculoskeletal:  Negative for back pain and myalgias.       See hpi. See exam.  Neurological:  Negative for dizziness and light-headedness.       Objective:   Physical Exam  General Mental Status- Alert. General Appearance- Not in acute distress. In wheel chair.  Skin General: Color- Normal Color. Moisture- Normal Moisture.  Neck Carotid Arteries- Normal color. Moisture- Normal Moisture. No carotid bruits. No JVD.  Chest and Lung Exam Auscultation: Breath Sounds:-Normal.  Cardiovascular Auscultation:Rythm- Regular. Murmurs & Other Heart Sounds:Auscultation of the heart reveals- No Murmurs.  Abdomen Inspection:-Inspeection Normal. Palpation/Percussion:Note:No mass. Palpation and Percussion of the  abdomen reveal- Non Tender, Non Distended + BS, no rebound or guarding.   Neurologic Cranial Nerve exam:- CN III-XII intact(No nystagmus), symmetric smile. Strength:- 5/5 equal and symmetric strength both upper and lower extremities.   Back- faint mid lumbar and rt side paralumbar tendernss. No obvoius cva pain.    Assessment & Plan:   Patient Instructions  Chronic cystitis Chronic cystitis without hematuria. Recent UTI hospitalization. Urologist noted swelling and irritation, expected to resolve in four weeks. Further antibiotic maybe needed - Order urine culture to assess current infection status. - Provide urine sample cup for later submission if unable to provide sample immediately.   Urinary tract infection Recent UTI with E. coli, treated with vancomycin  and Duricef. Pt reduced dosage due to C. diff concerns. NO current obvious uti signs and symptoms.  Hypotension Chronic hypotension, recent severe episode during UTI. Managed with fludrocortisone  and midodrine . Recent syncope likely due to dehydration and hypotension. - Continue fludrocortisone  and midodrine . - Advise on home management strategies for acute hypotension, including hydration, salt intake, and positioning.  Loose stools recently but normalizing per pt. History of C. diff infection, cautious antibiotic use. Recent C. diff test negative, but reports diarrhea. Current symptoms include soft stools, not typical of C. diff. - Order C. diff test if she experiences recurrent loose watery stools. - Provide stool sample kit for home use if symptoms worsen. - Advise on hydration with sugar-free Gatorade or [employer] water.  Chronic back pain Long-standing back pain complicating assessment of flank pain related to UTI  Follow up date to be determined after lab review.   Time spent with patient today was 45  minutes which consisted of chart review, discussing diagnosis, work up treatment and documentation.

## 2023-12-08 NOTE — Patient Instructions (Signed)
 Chronic cystitis Chronic cystitis without hematuria. Recent UTI hospitalization. Urologist noted swelling and irritation, expected to resolve in four weeks. Further antibiotic maybe needed - Order urine culture to assess current infection status. - Provide urine sample cup for later submission if unable to provide sample immediately.   Urinary tract infection Recent UTI with E. coli, treated with vancomycin  and Duricef. Pt reduced dosage due to C. diff concerns. NO current obvious uti signs and symptoms.  Hypotension Chronic hypotension, recent severe episode during UTI. Managed with fludrocortisone  and midodrine . Recent syncope likely due to dehydration and hypotension. - Continue fludrocortisone  and midodrine . - Advise on home management strategies for acute hypotension, including hydration, salt intake, and positioning.  Loose stools recently but normalizing per pt. History of C. diff infection, cautious antibiotic use. Recent C. diff test negative, but reports diarrhea. Current symptoms include soft stools, not typical of C. diff. - Order C. diff test if she experiences recurrent loose watery stools. - Provide stool sample kit for home use if symptoms worsen. - Advise on hydration with sugar-free Gatorade or [employer] water.  Chronic back pain Long-standing back pain complicating assessment of flank pain related to UTI  Follow up date to be determined after lab review.

## 2023-12-09 ENCOUNTER — Ambulatory Visit

## 2023-12-09 DIAGNOSIS — R278 Other lack of coordination: Secondary | ICD-10-CM | POA: Diagnosis not present

## 2023-12-09 DIAGNOSIS — Z7409 Other reduced mobility: Secondary | ICD-10-CM | POA: Diagnosis not present

## 2023-12-09 DIAGNOSIS — F444 Conversion disorder with motor symptom or deficit: Secondary | ICD-10-CM | POA: Diagnosis not present

## 2023-12-09 DIAGNOSIS — G259 Extrapyramidal and movement disorder, unspecified: Secondary | ICD-10-CM | POA: Diagnosis not present

## 2023-12-09 LAB — URINE CULTURE
MICRO NUMBER:: 16364992
SPECIMEN QUALITY:: ADEQUATE

## 2023-12-10 ENCOUNTER — Ambulatory Visit

## 2023-12-12 ENCOUNTER — Emergency Department (HOSPITAL_COMMUNITY)
Admission: EM | Admit: 2023-12-12 | Discharge: 2023-12-12 | Disposition: A | Discharge status: Home / Self Care | Attending: Emergency Medicine | Admitting: Emergency Medicine

## 2023-12-12 ENCOUNTER — Emergency Department (HOSPITAL_COMMUNITY)

## 2023-12-12 DIAGNOSIS — E876 Hypokalemia: Secondary | ICD-10-CM | POA: Insufficient documentation

## 2023-12-12 DIAGNOSIS — R531 Weakness: Secondary | ICD-10-CM | POA: Diagnosis not present

## 2023-12-12 DIAGNOSIS — Z85118 Personal history of other malignant neoplasm of bronchus and lung: Secondary | ICD-10-CM | POA: Insufficient documentation

## 2023-12-12 DIAGNOSIS — E162 Hypoglycemia, unspecified: Secondary | ICD-10-CM | POA: Diagnosis not present

## 2023-12-12 DIAGNOSIS — E039 Hypothyroidism, unspecified: Secondary | ICD-10-CM | POA: Insufficient documentation

## 2023-12-12 DIAGNOSIS — Z79899 Other long term (current) drug therapy: Secondary | ICD-10-CM | POA: Diagnosis not present

## 2023-12-12 DIAGNOSIS — I509 Heart failure, unspecified: Secondary | ICD-10-CM | POA: Diagnosis not present

## 2023-12-12 DIAGNOSIS — R739 Hyperglycemia, unspecified: Secondary | ICD-10-CM | POA: Diagnosis not present

## 2023-12-12 DIAGNOSIS — C3490 Malignant neoplasm of unspecified part of unspecified bronchus or lung: Secondary | ICD-10-CM | POA: Diagnosis not present

## 2023-12-12 DIAGNOSIS — N1831 Chronic kidney disease, stage 3a: Secondary | ICD-10-CM | POA: Insufficient documentation

## 2023-12-12 DIAGNOSIS — E161 Other hypoglycemia: Secondary | ICD-10-CM | POA: Diagnosis not present

## 2023-12-12 DIAGNOSIS — R000 Tachycardia, unspecified: Secondary | ICD-10-CM | POA: Diagnosis not present

## 2023-12-12 DIAGNOSIS — R0689 Other abnormalities of breathing: Secondary | ICD-10-CM | POA: Diagnosis not present

## 2023-12-12 LAB — URINALYSIS, ROUTINE W REFLEX MICROSCOPIC
Bilirubin Urine: NEGATIVE
Glucose, UA: NEGATIVE mg/dL
Hgb urine dipstick: NEGATIVE
Ketones, ur: NEGATIVE mg/dL
Nitrite: POSITIVE — AB
Protein, ur: NEGATIVE mg/dL
Specific Gravity, Urine: 1.009 (ref 1.005–1.030)
WBC, UA: >50 WBC/hpf (ref 0–5)
pH: 6 (ref 5.0–8.0)

## 2023-12-12 LAB — CBC
HCT: 38.1 % (ref 36.0–46.0)
Hemoglobin: 12.8 g/dL (ref 12.0–15.0)
MCH: 31.2 pg (ref 26.0–34.0)
MCHC: 33.6 g/dL (ref 30.0–36.0)
MCV: 92.9 fL (ref 80.0–100.0)
Platelets: 116 10*3/uL — ABNORMAL LOW (ref 150–400)
RBC: 4.1 MIL/uL (ref 3.87–5.11)
RDW: 13.2 % (ref 11.5–15.5)
WBC: 6 10*3/uL (ref 4.0–10.5)
nRBC: 0 % (ref 0.0–0.2)

## 2023-12-12 LAB — COMPREHENSIVE METABOLIC PANEL WITH GFR
ALT: 47 U/L — ABNORMAL HIGH (ref 0–44)
AST: 67 U/L — ABNORMAL HIGH (ref 15–41)
Albumin: 3.3 g/dL — ABNORMAL LOW (ref 3.5–5.0)
Alkaline Phosphatase: 59 U/L (ref 38–126)
Anion gap: 11 (ref 5–15)
BUN: 30 mg/dL — ABNORMAL HIGH (ref 8–23)
CO2: 28 mmol/L (ref 22–32)
Calcium: 8.8 mg/dL — ABNORMAL LOW (ref 8.9–10.3)
Chloride: 99 mmol/L (ref 98–111)
Creatinine, Ser: 1.44 mg/dL — ABNORMAL HIGH (ref 0.44–1.00)
GFR, Estimated: 38 mL/min — ABNORMAL LOW (ref >60)
Glucose, Bld: 199 mg/dL — ABNORMAL HIGH (ref 70–99)
Potassium: 3.2 mmol/L — ABNORMAL LOW (ref 3.5–5.1)
Sodium: 138 mmol/L (ref 135–145)
Total Bilirubin: 0.6 mg/dL (ref 0.0–1.2)
Total Protein: 6.6 g/dL (ref 6.5–8.1)

## 2023-12-12 LAB — TROPONIN I (HIGH SENSITIVITY)
Troponin I (High Sensitivity): 18 ng/L — ABNORMAL HIGH (ref <18)
Troponin I (High Sensitivity): 16 ng/L (ref <18)

## 2023-12-12 LAB — CBG MONITORING, ED: Glucose-Capillary: 86 mg/dL (ref 70–99)

## 2023-12-12 LAB — BRAIN NATRIURETIC PEPTIDE: B Natriuretic Peptide: 79.5 pg/mL (ref 0.0–100.0)

## 2023-12-12 MED ORDER — POTASSIUM CHLORIDE CRYS ER 20 MEQ PO TBCR
40.0000 meq | EXTENDED_RELEASE_TABLET | Freq: Once | ORAL | Status: AC
  Administered 2023-12-12: 40 meq via ORAL
  Filled 2023-12-12: qty 2

## 2023-12-12 MED ORDER — HEPARIN SOD (PORK) LOCK FLUSH 100 UNIT/ML IV SOLN
500.0000 [IU] | Freq: Once | INTRAVENOUS | Status: DC
  Filled 2023-12-12: qty 5

## 2023-12-12 NOTE — ED Triage Notes (Signed)
 Per EMS from home. Weakness for past two days. Recent hospital stay, dx UTI. Vancomycin  prescribed for home use, pt med non-compliant.   BP 78/26 HR 102 RR 18 T 97.6 CBG 66

## 2023-12-12 NOTE — ED Provider Notes (Signed)
 [facility] AT [facility] Provider Note   CSN: [id number] Arrival date & time: 12/12/23  1053     History  Chief Complaint  Patient presents with   Weakness   Recurrent UTI    This is a 78 year old female with a past medical history of HFpEF, hypothyroidism, CKD 3A, IgG deficiency, spinal stimulator, gastric bypass, history of lung cancer status post wedge resection, presenting today with ongoing fatigue and malaise.  She had recent hospital discharge on 12/02/2023 in the setting of complicated UTI secondary to ESBL E. coli and was discharged on cefadroxil .  She presented to the ED on the 19th with symptoms of fatigue and weakness.  She did see her primary care doctor on the 23rd with similar concerns to today's visit.  The patient states that since that time, she has noticed little improvement and feels like she has gotten worse.  Over the last several days she has experienced increased fatigue and malaise.  She states she has had many asymptomatic UTIs in the past and has had difficulty with treatment due C. difficile infection.  It seems that she may have altered her antibiotic dosing regiment in fear of C. difficile infection.  The patient states she did a home UTI test which was positive this morning.  She does however deny ongoing symptoms, but states that she does not often have symptoms when she has UTI.  At baseline she uses an Mining engineer wheelchair for ambulation.  She is able to transfer from bed to wheelchair and wheelchair to toilet, but unable to take showers by herself.  She says that her functional status has declined significantly over the past several days.        Home Medications Prior to Admission medications   Medication Sig Start Date End Date Taking? Authorizing Provider  CALCIUM  PO Take 1 tablet by mouth in the morning and at bedtime.    [provider]  cefadroxil  (DURICEF) 500 MG capsule Take 1 capsule (500 mg total) by  mouth 2 (two) times daily. 12/02/23   Barbee Lew, MD  Chlorpheniramine-PSE-Ibuprofen  (ADVIL  ALLERGY SINUS PO) Take 1 tablet by mouth daily.    [provider]  cholecalciferol  (VITAMIN D3) 25 MCG (1000 UNIT) tablet Take 1,000 Units by mouth daily.    [provider]  cycloSPORINE  (RESTASIS ) 0.05 % ophthalmic emulsion Place 1 drop into both eyes 4 (four) times daily as needed.    [provider]  denosumab  (PROLIA ) 60 MG/ML SOSY injection Pt to get at office.  Appt is 08/27/23 07/14/23   Jobe Mulder, DO  dexlansoprazole  (DEXILANT ) 60 MG capsule Take 1 capsule (60 mg total) by mouth daily. 07/26/23   Edmonia Gottron, PA-C  diclofenac  Sodium (VOLTAREN ) 1 % GEL Apply 2 g topically 4 (four) times daily. Patient taking differently: Apply 2 g topically 4 (four) times daily as needed. 07/27/23   Sonnie Dusky, PA-C  dicyclomine  (BENTYL ) 20 MG tablet Take 20 mg by mouth in the morning and at bedtime. 09/04/23   [provider]  famotidine  (PEPCID ) 20 MG tablet Take 1 tablet (20 mg total) by mouth 2 (two) times daily. 09/20/23   Edmonia Gottron, PA-C  ferrous sulfate  325 (65 FE) MG tablet Take 650 mg by mouth daily with breakfast.    [provider]  fludrocortisone  (FLORINEF ) 0.1 MG tablet TAKE ONE TABLET BY MOUTH TWICE DAILY 11/22/23   Wendling, Shellie Dials, DO  GAMMAGARD 20 GM/200ML SOLN Inject 40 g  into the vein every 21 ( twenty-one) days. INFUSE 40G INTRAVENOUSLY EVERY 3 WEEKS 02/27/22   [provider]  KEVZARA 200 MG/1. SOAJ Inject 1.14 mLs into the skin every 14 (fourteen) days. 09/09/22   [provider]  levothyroxine  (SYNTHROID ) 75 MCG tablet TAKE ONE (1) TABLET BY MOUTH EACH DAY BEFORE BREAKFAST ON EMPTY STOMACH 07/19/23   Jobe Mulder, DO  lidocaine  4 % Place 1 patch onto the skin daily. Patient taking differently: Place 1 patch onto the skin daily as needed. 07/08/23   Carin Charleston, MD   magnesium  oxide (MAG-OX) 400 MG tablet Take 400 mg by mouth daily.     [provider]  methylphenidate  (RITALIN ) 10 MG tablet Take 1 tablet (10 mg total) by mouth daily. Patient taking differently: Take 10 mg by mouth 2 (two) times daily with breakfast and lunch. 09/15/23 09/14/24  [name], Keven Pel, MD  midodrine  (PROAMATINE ) 10 MG tablet TAKE ONE (1) TABLET BY MOUTH 3 TIMES DAILY 08/09/23   Gwenette Lennox, Shellie Dials, DO  MYRBETRIQ  50 MG VQ25 tablet Take 50 mg by mouth every evening. 10/07/22   [provider]  naloxone  (NARCAN ) nasal spray 4 mg/0.1 mL Spray up nostril in event of opioid overdose. 06/28/23   Jobe Mulder, DO  Plecanatide  (TRULANCE ) 3 MG TABS Take 1 tablet (3 mg total) by mouth daily. 09/20/23 12/19/23  Collier, Amanda R, PA-C  potassium chloride  (KLOR-CON ) 10 MEQ tablet Take 1 tablet (10 mEq total) by mouth in the morning, at noon, in the evening, and at bedtime. 11/01/23   Jobe Mulder, DO  pregabalin  (LYRICA ) 50 MG capsule Take 1 capsule (50 mg total) by mouth 3 (three) times daily. 07/22/23   Jobe Mulder, DO  pseudoephedrine (SUDAFED) 120 MG 12 hr tablet Take 120 mg by mouth every 12 (twelve) hours as needed for congestion.    [provider]  pyridoxine (B-6) 100 MG tablet Take 100 mg by mouth daily.    Jobe Mulder, DO  tamsulosin  (FLOMAX ) 0.4 MG CAPS capsule Take 0.4 mg by mouth daily. 11/25/23   [provider]  torsemide  (DEMADEX ) 20 MG tablet Take 20 mg by mouth 2 (two) times daily.    [provider]  triamcinolone  cream (KENALOG ) 0.1 % Apply 1 application  topically 2 (two) times daily as needed (rash).    [provider]  vancomycin  (VANCOCIN ) 125 MG capsule Take 1 capsule (125 mg total) by mouth daily. 12/03/23   Barbee Lew, MD  Vitamin D , Ergocalciferol , (DRISDOL ) 1.25 MG (50000 UNIT) CAPS capsule TAKE 1 CAPSULE ONCE WEEKLY 01/04/23   [name], Keven Pel, MD  ZINC  OXIDE,  TOPICAL, 10 % CREA Apply 1 application topically 2 (two) times daily as needed (rash). 02/14/21   Audria Leather, MD      Allergies    Butorphanol, Erythromycin base, Propoxyphene, Sumatriptan , Tetracycline, Ciprofloxacin, Corticosteroids, Doxycycline , Cefixime, Isometheptene-dichloral-apap, Ketorolac , Prednisone , Promethazine , and Tape    Review of Systems   Review of Systems  Constitutional:  Positive for activity change and fatigue. Negative for chills and fever.  Respiratory:  Negative for shortness of breath.   Cardiovascular:  Negative for chest pain and leg swelling.  Gastrointestinal:  Negative for abdominal pain, constipation and diarrhea.       Endorses bowel movements without frank diarrhea  Genitourinary:  Positive for frequency. Negative for dysuria and urgency.    Physical Exam Updated Vital Signs BP (!) 116/59   Pulse 78  Temp 97.8 F (36.6 C) (Oral)   Resp 18   SpO2 99%  Physical Exam Constitutional:      Comments: Chronically ill-appearing, nontoxic, no acute distress.  Cardiovascular:     Rate and Rhythm: Normal rate and regular rhythm.     Comments: Significant murmur at the apex and right upper sternal border. Pulmonary:     Effort: Pulmonary effort is normal.     Breath sounds: Normal breath sounds.     ED Results / Procedures / Treatments   Labs (all labs ordered are listed, but only abnormal results are displayed) Labs Reviewed  COMPREHENSIVE METABOLIC PANEL WITH GFR - Abnormal; Notable for the following components:      Result Value   Potassium 3.2 (*)    Glucose, Bld 199 (*)    BUN 30 (*)    Creatinine, Ser 1.44 (*)    Calcium  8.8 (*)    Albumin  3.3 (*)    AST 67 (*)    ALT 47 (*)    GFR, Estimated 38 (*)    All other components within normal limits  CBC - Abnormal; Notable for the following components:   Platelets 116 (*)    All other components within normal limits  URINALYSIS, ROUTINE W REFLEX MICROSCOPIC - Abnormal; Notable for the  following components:   APPearance HAZY (*)    Nitrite POSITIVE (*)    Leukocytes,Ua LARGE (*)    Bacteria, UA RARE (*)    All other components within normal limits  TROPONIN I (HIGH SENSITIVITY) - Abnormal; Notable for the following components:   Troponin I (High Sensitivity) 18 (*)    All other components within normal limits  URINE CULTURE  BRAIN NATRIURETIC PEPTIDE  CBG MONITORING, ED  TROPONIN I (HIGH SENSITIVITY)    EKG EKG Interpretation Date/Time:  Sunday December 12 2023 13:21:19 EDT Ventricular Rate:  77 PR Interval:  161 QRS Duration:  143 QT Interval:  415 QTC Calculation: 470 R Axis:   31  Text Interpretation: Sinus rhythm Consider left atrial enlargement Left bundle branch block No significant change since last tracing Confirmed by Almond Army ([id number]) on 12/12/2023 2:10:00 PM  Radiology DG Chest Portable 1 View Result Date: 12/12/2023 CLINICAL DATA:  Weakness.  Lung cancer. EXAM: PORTABLE CHEST 1 VIEW COMPARISON:  One-view chest x-ray 11/29/2023 FINDINGS: The heart size is normal. Dorsal spinal cord stimulators are again noted. A right IJ Port-A-Cath is in place. Right lower lobectomy noted. The lungs are clear. IMPRESSION: 1. No acute cardiopulmonary disease. 2. Right lower lobectomy. Electronically Signed   By: Audree Leas M.D.   On: 12/12/2023 13:06    Procedures Procedures    Medications Ordered in ED Medications  potassium chloride  SA (KLOR-CON  M) CR tablet 40 mEq (has no administration in time range)    ED Course/ Medical Decision Making/ A&P                                 Medical Decision Making Amount and/or Complexity of Data Reviewed Labs: ordered. Radiology: ordered.  Risk Prescription drug management.   In triage, patient's blood pressure 78/26, heart rate 102, CBG 66.  Patient seen in the room with significant improvement to her vitals without intervention, blood pressure 142/106, pulse 78 satting 100% on room air.  CBG  rechecked and normal.  Per patient her blood pressure is chronically low, usually 80-90 systolic and 50-60 diastolic.  Unclear etiology of  her ongoing symptoms.  This seems to be a chronic issue for this patient and her functional status does appear to be relatively poor at baseline.  During her most recent hospitalization, she was unable to work for PT for 2 days due to weakness.  PT was able to evaluate her prior to discharge, and she had returned to her functional baseline at that point with ability to ambulate in the hallway.  She has outpatient PT set up who she is working with, but states that is very difficult for her.  She did have recent UA and urine culture, UA relatively unremarkable and urine culture growing mixed genital flora.  She did receive IV antibiotics while inpatient but did alter her outpatient antibiotic regiment due to concerns of C. difficile.  Given her baseline hypotension, limited functional status, relative paucity of symptoms it is difficult to interpret whether her prior episodes are driven by true UTI or reflect chronic colonization.  She does have relatively significant murmur for me today, and I do see note from cardiology documenting 3 out of 6 brief systolic apical murmur.  No fevers, chills to suggest infection. Most recent echo in October 2024 without significant findings.  Urinalysis did not show positive nitrates, large leukocytes, but rare bacteria.  Unclear if her pyuria represents UTI versus sterile cystic inflammation.  Based on documentation from urology, seems that some ongoing inflammation is to be expected.  Her lab work is otherwise relatively similar to prior.  I imagine some of her symptoms this morning were driven by her hypoglycemia, but otherwise she continues to be hemodynamically stable here.  I had lengthy discussion with the patient today regarding our findings.  Based on our workup, I do not see any acute reason for her to have had worsening of her  symptoms.  We will need a urine culture before we are able to see a if she is truly having UTI driving some of her pathology at this time.  Otherwise I recommended that the patient tries to increase her p.o. intake and not shy away from sodas or sugars.  I do believe she has a great deal of deconditioning which she is already working with physical therapy for.  It is understandably difficult for her to complete some of her sessions due to her fatigue.  I did offer to ask PT to evaluate her for possible skilled nursing facility/rehab bed, but strongly desires to avoid SNF placement.  Should the patient continue to have/develop new symptoms, she should not hesitate to reach out for additional help.  This was discussed with the patient.          Final Clinical Impression(s) / ED Diagnoses Final diagnoses:  Weakness  Hypokalemia    Rx / DC Orders ED Discharge Orders     None         Sheree Dieter, MD 12/12/23 1527    Almond Army, MD 12/14/23 2138

## 2023-12-12 NOTE — Discharge Instructions (Signed)
 We have sent your urine for culture. We are not sure if you are having a urine infection at this time. If your urine culture becomes positive, we will call you. Please also return for any worsening symptoms.

## 2023-12-13 ENCOUNTER — Telehealth: Payer: Self-pay

## 2023-12-13 NOTE — Transitions of Care (Post Inpatient/ED Visit) (Signed)
 12/13/2023  Name: Megan Brewer MRN: [id number] DOB: Mar 26, 1946  Today's TOC FU Call Status: Today's TOC FU Call Status:: Successful TOC FU Call Completed TOC FU Call Complete Date: 12/13/23 Patient's Name and Date of Birth confirmed.  Transition Care Management Follow-up Telephone Call Date of Discharge: 12/12/23 Discharge Facility: [facility] Smalling [facility]) Type of Discharge: Emergency Department Primary Inpatient Discharge Diagnosis:: Weakness Any questions or concerns?: No  Items Reviewed: Did you receive and understand the discharge instructions provided?: Yes Medications obtained,verified, and reconciled?: Yes (Medications Reviewed) Any new allergies since your discharge?: No Dietary orders reviewed?: NA Do you have support at home?: Yes People in Home [RPT]: spouse  Medications Reviewed Today: Medications Reviewed Today     Reviewed by Cathye Coca, LPN (Licensed Practical Nurse) on 12/13/23 at 1503  Med List Status: <None>   Medication Order Taking? Sig Documenting Provider Last Dose Status Informant  CALCIUM  PO [id number] Yes Take 1 tablet by mouth in the morning and at bedtime. [provider] Taking Active Self           Med Note Nolan Battle, [facility] I   Mon Nov 29, 2023 11:10 PM)    cefadroxil  (DURICEF) 500 MG capsule [id number] Yes Take 1 capsule (500 mg total) by mouth 2 (two) times daily. Barbee Lew, MD Taking Active   Chlorpheniramine-PSE-Ibuprofen  (ADVIL  ALLERGY SINUS PO) [id number] Yes Take 1 tablet by mouth daily. [provider] Taking Active Self  cholecalciferol  (VITAMIN D3) 25 MCG (1000 UNIT) tablet [id number] Yes Take 1,000 Units by mouth daily. [provider] Taking Active Self  cycloSPORINE  (RESTASIS ) 0.05 % ophthalmic emulsion [id number] Yes Place 1 drop into both eyes 4 (four) times daily as needed. [provider] Taking Active Self           Med Note Nolan Battle, [facility] I   Mon Nov 29, 2023 11:09 PM)     denosumab  (PROLIA ) 60 MG/ML SOSY injection [id number] Yes Pt to get at office.  Appt is 08/27/23 Jobe Mulder, DO Taking Active Self           Med Note Nolan Battle, [facility] I   Mon Nov 29, 2023 11:03 PM) Several Months  denosumab  (PROLIA ) injection 60 mg [id number]   Jobe Mulder, DO  Active   dexlansoprazole  (DEXILANT ) 60 MG capsule [id number] Yes Take 1 capsule (60 mg total) by mouth daily. Edmonia Gottron, PA-C Taking Active Self  diclofenac  Sodium (VOLTAREN ) 1 % GEL [id number] Yes Apply 2 g topically 4 (four) times daily.  Patient taking differently: Apply 2 g topically 4 (four) times daily as needed.   Sonnie Dusky, PA-C Taking Active Self  dicyclomine  (BENTYL ) 20 MG tablet [id number] Yes Take 20 mg by mouth in the morning and at bedtime. [provider] Taking Active Self  famotidine  (PEPCID ) 20 MG tablet [id number] Yes Take 1 tablet (20 mg total) by mouth 2 (two) times daily. Edmonia Gottron, PA-C Taking Active Self  ferrous sulfate  325 (65 FE) MG tablet [id number] Yes Take 650 mg by mouth daily with breakfast. [provider] Taking Active Self           Med Note Joey Muster Oct 09, 2021 11:30 AM)    fludrocortisone  (FLORINEF ) 0.1 MG tablet [id number] Yes TAKE ONE TABLET BY MOUTH TWICE DAILY Jobe Mulder, DO Taking Active Self  GAMMAGARD 20 GM/200ML SOLN [id number] Yes Inject 40 g into the vein every 21 ( twenty-one) days. INFUSE 40G INTRAVENOUSLY EVERY 3  WEEKS [provider] Taking Active Self  KEVZARA 200 MG/1. SOAJ [id number] Yes Inject 1.14 mLs into the skin every 14 (fourteen) days. [provider] Taking Active Self           Med Note Thurman Flores   Sat Jun 26, 2023  3:14 PM)    levothyroxine  (SYNTHROID ) 75 MCG tablet [id number] Yes TAKE ONE (1) TABLET BY MOUTH EACH DAY BEFORE BREAKFAST ON EMPTY STOMACH Jobe Mulder, DO Taking Active Self  lidocaine  4 % [id number] Yes Place 1 patch  onto the skin daily.  Patient taking differently: Place 1 patch onto the skin daily as needed.   Carin Charleston, MD Taking Active Self           Med Note Lajuana Pilar, Elodie Haines Sep 09, 2023  9:10 AM) Patient reports takes As needed  magnesium  oxide (MAG-OX) 400 MG tablet [id number] Yes Take 400 mg by mouth daily.  [provider] Taking Active Self  methylphenidate  (RITALIN ) 10 MG tablet [id number] Yes Take 1 tablet (10 mg total) by mouth daily.  Patient taking differently: Take 10 mg by mouth 2 (two) times daily with breakfast and lunch.   Liam Redhead, MD Taking Active Self  midodrine  (PROAMATINE ) 10 MG tablet [id number] Yes TAKE ONE (1) TABLET BY MOUTH 3 TIMES DAILY Jobe Mulder, DO Taking Active Self  MYRBETRIQ  50 MG TB24 tablet [id number] Yes Take 50 mg by mouth every evening. [provider] Taking Active Self  naloxone  (NARCAN ) nasal spray 4 mg/0.1 mL [id number] Yes Spray up nostril in event of opioid overdose. Jobe Mulder, DO Taking Active Self           Med Note Bud Care, SANDY L   Thu Sep 23, 2023  9:11 AM) Never used  Plecanatide  (TRULANCE ) 3 MG TABS [id number] Yes Take 1 tablet (3 mg total) by mouth daily. Edmonia Gottron, PA-C Taking Active Self           Med Note Nolan Battle, [place] I   Mon Nov 29, 2023 11:10 PM)    potassium chloride  (KLOR-CON ) 10 MEQ tablet [id number] Yes Take 1 tablet (10 mEq total) by mouth in the morning, at noon, in the evening, and at bedtime. Jobe Mulder, DO Taking Active Self  pregabalin  (LYRICA ) 50 MG capsule [id number] Yes Take 1 capsule (50 mg total) by mouth 3 (three) times daily. Jobe Mulder, DO Taking Active Self  pseudoephedrine (SUDAFED) 120 MG 12 hr tablet [id number] Yes Take 120 mg by mouth every 12 (twelve) hours as needed for congestion. [provider] Taking Active Self  pyridoxine (B-6) 100 MG tablet [id number] Yes Take 100 mg by mouth daily. Jobe Mulder, DO  Taking Active Self  tamsulosin  (FLOMAX ) 0.4 MG CAPS capsule [id number] Yes Take 0.4 mg by mouth daily. [provider] Taking Active Self  torsemide  (DEMADEX ) 20 MG tablet [id number] Yes Take 20 mg by mouth 2 (two) times daily. [provider] Taking Active Self  triamcinolone  cream (KENALOG ) 0.1 % [id number] Yes Apply 1 application  topically 2 (two) times daily as needed (rash). [provider] Taking Active Self           Med Note Thurman Flores   Sat Jun 26, 2023  3:22 PM)    vancomycin  (VANCOCIN ) 125 MG capsule [id number] Yes Take 1 capsule (125 mg total) by mouth daily. Barbee Lew, MD Taking Active   Vitamin D , Ergocalciferol , (DRISDOL ) 1.25 MG (  50000 UNIT) CAPS capsule [id number] Yes TAKE 1 CAPSULE ONCE WEEKLY [name], Keven Pel, MD Taking Active Self  ZINC  OXIDE, TOPICAL, 10 % CREA [id number] Yes Apply 1 application topically 2 (two) times daily as needed (rash). Audria Leather, MD Taking Active Self           Med Note (CARD, AMY L   Tue Dec 22, 2022  6:44 PM) PRN            Home Care and Equipment/Supplies: Were Home Health Services Ordered?: NA Any new equipment or medical supplies ordered?: NA  Functional Questionnaire: Do you need assistance with bathing/showering or dressing?: Yes Do you need assistance with meal preparation?: Yes Do you need assistance with eating?: No Do you have difficulty maintaining continence: No Do you need assistance with getting out of bed/getting out of a chair/moving?: No Do you have difficulty managing or taking your medications?: No  Follow up appointments reviewed: PCP Follow-up appointment confirmed?: Yes Date of PCP follow-up appointment?: 12/22/23 Follow-up Provider: Dr. Lyle [facility] Follow-up appointment confirmed?: NA Do you need transportation to your follow-up appointment?: No Do you understand care options if your condition(s) worsen?: Yes-patient verbalized  understanding    SIGNATURE Seabron Cypress, LPN [facility] Health Advisor [place] [facility] You Are. We Are. [facility] Direct Dial [phone]

## 2023-12-14 LAB — URINE CULTURE: Culture: >=100000 — AB

## 2023-12-15 ENCOUNTER — Other Ambulatory Visit (HOSPITAL_COMMUNITY): Payer: Self-pay

## 2023-12-15 ENCOUNTER — Telehealth (HOSPITAL_COMMUNITY): Payer: Self-pay

## 2023-12-15 ENCOUNTER — Telehealth (HOSPITAL_COMMUNITY): Payer: Self-pay | Admitting: Pharmacy Technician

## 2023-12-15 ENCOUNTER — Telehealth (HOSPITAL_BASED_OUTPATIENT_CLINIC_OR_DEPARTMENT_OTHER): Payer: Self-pay | Admitting: Emergency Medicine

## 2023-12-15 NOTE — Telephone Encounter (Signed)
 ED Antimicrobial Stewardship Positive Culture Follow Up   Megan Brewer is an 78 y.o. female who presented to [facility] on (Not on file) with a chief complaint of  Chief Complaint  Patient presents with   Results    Urine Culture    Recent Results (from the past 720 hours)  Urine Culture     Status: Abnormal   Collection Time: 11/29/23 11:20 PM   Specimen: Urine, Clean Catch  Result Value Ref Range Status   Specimen Description   Final    URINE, CLEAN CATCH Performed at [facility], [address], [place], [place] [address]    Special Requests   Final    NONE Performed at [facility], [address] [place], [place] [address]    Culture >=100,000 COLONIES/mL ESCHERICHIA COLI (A)  Final   Report Status 12/02/2023 FINAL  Final   Organism ID, Bacteria ESCHERICHIA COLI (A)  Final      Susceptibility   Escherichia coli - MIC*    AMPICILLIN >=32 RESISTANT Resistant     CEFAZOLIN 16 SENSITIVE Sensitive     CEFEPIME  <=0.12 SENSITIVE Sensitive     CEFTRIAXONE  <=0.25 SENSITIVE Sensitive     CIPROFLOXACIN <=0.25 SENSITIVE Sensitive     GENTAMICIN <=1 SENSITIVE Sensitive     IMIPENEM <=0.25 SENSITIVE Sensitive     NITROFURANTOIN  <=16 SENSITIVE Sensitive     TRIMETH /SULFA  <=20 SENSITIVE Sensitive     AMPICILLIN/SULBACTAM >=32 RESISTANT Resistant     PIP/TAZO <=4 SENSITIVE Sensitive ug/mL    * >=100,000 COLONIES/mL ESCHERICHIA COLI  Gastrointestinal Panel by PCR , Stool     Status: None   Collection Time: 12/04/23 12:50 PM   Specimen: Stool  Result Value Ref Range Status   Campylobacter species NOT DETECTED NOT DETECTED Final   Plesimonas shigelloides NOT DETECTED NOT DETECTED Final   Salmonella species NOT DETECTED NOT DETECTED Final   Yersinia enterocolitica NOT DETECTED NOT DETECTED Final   Vibrio species NOT DETECTED NOT DETECTED Final   Vibrio cholerae NOT DETECTED NOT DETECTED Final   Enteroaggregative E coli (EAEC) NOT  DETECTED NOT DETECTED Final   Enteropathogenic E coli (EPEC) NOT DETECTED NOT DETECTED Final   Enterotoxigenic E coli (ETEC) NOT DETECTED NOT DETECTED Final   Shiga like toxin producing E coli (STEC) NOT DETECTED NOT DETECTED Final   Shigella/Enteroinvasive E coli (EIEC) NOT DETECTED NOT DETECTED Final   Cryptosporidium NOT DETECTED NOT DETECTED Final   Cyclospora cayetanensis NOT DETECTED NOT DETECTED Final   Entamoeba histolytica NOT DETECTED NOT DETECTED Final   Giardia lamblia NOT DETECTED NOT DETECTED Final   Adenovirus F40/41 NOT DETECTED NOT DETECTED Final   Astrovirus NOT DETECTED NOT DETECTED Final   Norovirus GI/GII NOT DETECTED NOT DETECTED Final   Rotavirus A NOT DETECTED NOT DETECTED Final   Sapovirus (I, II, IV, and V) NOT DETECTED NOT DETECTED Final    Comment: Performed at [facility], [address] [place], [place] [address]  C Difficile Quick Screen w PCR reflex     Status: None   Collection Time: 12/04/23 12:50 PM   Specimen: Stool  Result Value Ref Range Status   C Diff antigen NEGATIVE NEGATIVE Final   C Diff toxin NEGATIVE NEGATIVE Final   C Diff interpretation No C. difficile detected.  Final    Comment: Performed at [facility], [address], [place], [place] [address]  Urine Culture     Status: None  Collection Time: 12/08/23 11:55 AM   Specimen: Urine  Result Value Ref Range Status   MICRO NUMBER: [id number]  Final   SPECIMEN QUALITY: Adequate  Final   Sample Source NOT GIVEN  Final   STATUS: FINAL  Final   Result:   Final    Mixed genital flora isolated. These superficial bacteria are not indicative of a urinary tract infection. No further organism identification is warranted on this specimen. If clinically indicated, recollect clean-catch, mid-stream urine and transfer  immediately to Urine Culture Transport Tube.   Urine Culture     Status: Abnormal   Collection Time: 12/12/23  1:41 PM   Specimen: Urine, Clean Catch   Result Value Ref Range Status   Specimen Description   Final    URINE, CLEAN CATCH Performed at [facility], [address], [place], [place] [address]    Special Requests   Final    NONE Performed at [facility], [address] [place], [place] [address]    Culture >=100,000 COLONIES/mL ESCHERICHIA COLI (A)  Final   Report Status 12/14/2023 FINAL  Final   Organism ID, Bacteria ESCHERICHIA COLI (A)  Final      Susceptibility   Escherichia coli - MIC*    AMPICILLIN >=32 RESISTANT Resistant     CEFAZOLIN >=64 RESISTANT Resistant     CEFEPIME  <=0.12 SENSITIVE Sensitive     CEFTRIAXONE  1 SENSITIVE Sensitive     CIPROFLOXACIN <=0.25 SENSITIVE Sensitive     GENTAMICIN <=1 SENSITIVE Sensitive     IMIPENEM <=0.25 SENSITIVE Sensitive     NITROFURANTOIN  32 SENSITIVE Sensitive     TRIMETH /SULFA  <=20 SENSITIVE Sensitive     AMPICILLIN/SULBACTAM >=32 RESISTANT Resistant     PIP/TAZO <=4 SENSITIVE Sensitive ug/mL    * >=100,000 COLONIES/mL ESCHERICHIA COLI    []  Treated with ___, organism resistant to prescribed antimicrobial [x]  Patient discharged originally without antimicrobial agent and treatment is now indicated  Spoke with patient, new signs of UTI including dysuria, frequency, and suprapubic tenderness. Declines flank pain, fevers, chills, or other systemic signs of infection. Counseled on administration of Fosfomycin. She requests Rx to [facility].  New antibiotic prescription: Fosfomycin 3 PO x 1 dose  ED Provider: Adel Aden, PA-C  Jennett Model Draysen Weygandt 12/15/2023, 8:45 AM Clinical Pharmacist Monday - Friday phone -  [phone] Saturday - Sunday phone - [phone]

## 2023-12-15 NOTE — Telephone Encounter (Signed)
 Post ED Visit - Positive Culture Follow-up: Successful Patient Follow-Up  Culture assessed and recommendations reviewed by:  []  Court Distance, Pharm.D. []  Skeet Duke, Pharm.D., BCPS AQ-ID []  Leslee Rase, Pharm.D., BCPS []  Garland Junk, Pharm.D., BCPS []  [place], [address].D., BCPS, AAHIVP []  Alcide Aly, Pharm.D., BCPS, AAHIVP []  Jerri Morale, PharmD, BCPS []  Graham Laws, PharmD, BCPS []  Cleda Curly, PharmD, BCPS [x]  Estela Held, PharmD  Positive urine culture  []  Patient discharged without antimicrobial prescription and treatment is now indicated [x]  Organism is resistant to prescribed ED discharge antimicrobial []  Patient with positive blood cultures  Changes discussed with ED provider: Keneth Pay PA New antibiotic prescription Fosfomycin 3 gram Po x one dose Called to [facility] [phone]  Contacted patient, date 12/15/23 by Estela Held [facility] per note   Lillie Reining 12/15/2023, 11:58 AM

## 2023-12-15 NOTE — Telephone Encounter (Signed)
 Patient Product/process development scientist completed.    The patient is insured through [employer]. Patient has Medicare and is not eligible for a copay card, but may be able to apply for patient assistance or Medicare RX Payment Plan (Patient Must reach out to their plan, if eligible for payment plan), if available.    Ran test claim for fosfomycin (Monurol) 3 g pack and the current 1 day co-pay is $0.00.   This test claim was processed through [facility]- copay amounts may vary at other pharmacies due to pharmacy/plan contracts, or as the patient moves through the different stages of their insurance plan.     Morgan Arab, CPHT Pharmacy Technician III Certified Patient Advocate [facility] Patient Advocate Team Direct Number: [phone]  Fax: [phone]

## 2023-12-16 ENCOUNTER — Other Ambulatory Visit: Payer: Self-pay | Admitting: Physician Assistant

## 2023-12-22 ENCOUNTER — Encounter: Payer: Self-pay | Admitting: Family Medicine

## 2023-12-22 ENCOUNTER — Ambulatory Visit: Admitting: Family Medicine

## 2023-12-22 VITALS — BP 118/70 | HR 54 | Temp 97.8°F | Resp 16 | Ht 64.0 in | Wt 127.0 lb

## 2023-12-22 DIAGNOSIS — E876 Hypokalemia: Secondary | ICD-10-CM | POA: Diagnosis not present

## 2023-12-22 DIAGNOSIS — N3000 Acute cystitis without hematuria: Secondary | ICD-10-CM

## 2023-12-22 DIAGNOSIS — Z9884 Bariatric surgery status: Secondary | ICD-10-CM | POA: Diagnosis not present

## 2023-12-22 DIAGNOSIS — M810 Age-related osteoporosis without current pathological fracture: Secondary | ICD-10-CM | POA: Diagnosis not present

## 2023-12-22 MED ORDER — FOSFOMYCIN TROMETHAMINE 3 G PO PACK: 3.0000 g | PACK | Freq: Once | ORAL | 0 refills | Status: AC

## 2023-12-22 NOTE — Progress Notes (Signed)
 Chief Complaint  Patient presents with   Hospitalization Follow-up    Hospital Follow Up    Subjective: Patient is a 78 y.o. female here for f/u.  UTI 10 d ago dx'd in the ED. She was not initially treated with anything but was called later and was given a dose of fosfomycin.  She reported some improvement and no adverse effects.  Over the past few weeks since dealing with these issues, she has had volatile blood sugars and blood pressure.  Usually her blood pressure runs low but it has been spiking into the 190 systolic.  Blood sugars have gone up to the 240s.   Past Medical History:  Diagnosis Date   Anemia    Arachnoiditis    Bipolar disorder (HCC)    Cataract    removed   Chronic post-thoracotomy pain    CKD (chronic kidney disease)    GERD (gastroesophageal reflux disease)    Hypogammaglobulinemia (HCC)    Hypotension    Hypothyroid    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HCC)    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HCC)    Low blood pressure    Lung cancer (HCC) 2011, 2014   Memory loss    Migraine    Opioid abuse (HCC)    Osteoporosis    PMR (polymyalgia rheumatica) (HCC) 03/17/2022   Presence of neurostimulator    Restless legs    Sleep apnea     Objective: BP 118/70 (BP Location: Left Arm, Patient Position: Sitting)   Pulse (!) 54   Temp 97.8 F (36.6 C) (Oral)   Resp 16   Ht 5\' 4"  (1.626 m)   Wt 127 lb (57.6 kg)   SpO2 95%   BMI 21.80 kg/m  General: Awake, appears stated age Heart: Regular rhythm, bradycardic, no LE edema Lungs: CTAB, no rales, wheezes or rhonchi. No accessory muscle use Abdomen: Bowel sounds present, soft, nondistended, mild pressure in the suprapubic region MSK: No CVA TTP Psych: Age appropriate judgment and insight, normal affect and mood  Assessment and Plan: Acute cystitis without hematuria - Plan: fosfomycin (MONUROL) 3 g PACK  Hypokalemia - Plan: Basic metabolic panel, Magnesium   History of gastric bypass - Plan: Vitamin B1,  B12  Osteoporosis, unspecified osteoporosis type, unspecified pathological fracture presence - Plan: VITAMIN D  25 Hydroxy (Vit-D Deficiency, Fractures)  Repeat dose of fosfomycin.  Stay hydrated.  Send message early next week if still not improved.  I think this is affecting her blood pressure and sugars. Recheck labs. B1/B12 Check vitamin D .  Follow-up as originally scheduled. The patient voiced understanding and agreement to the plan.  Shellie Dials [place], DO 12/22/23  4:02 PM

## 2023-12-23 ENCOUNTER — Telehealth: Payer: Self-pay

## 2023-12-23 ENCOUNTER — Encounter: Payer: Self-pay | Admitting: Family Medicine

## 2023-12-23 ENCOUNTER — Ambulatory Visit

## 2023-12-23 VITALS — BP 83/57 | HR 74 | Temp 97.5°F | Resp 16 | Ht 64.0 in

## 2023-12-23 DIAGNOSIS — M353 Polymyalgia rheumatica: Secondary | ICD-10-CM

## 2023-12-23 DIAGNOSIS — M2550 Pain in unspecified joint: Secondary | ICD-10-CM

## 2023-12-23 LAB — VITAMIN B12: Vitamin B-12: 642 pg/mL (ref 211–911)

## 2023-12-23 LAB — VITAMIN D 25 HYDROXY (VIT D DEFICIENCY, FRACTURES): VITD: 114.06 ng/mL (ref 30.00–100.00)

## 2023-12-23 LAB — MAGNESIUM: Magnesium: 2.1 mg/dL (ref 1.5–2.5)

## 2023-12-23 MED ORDER — SODIUM CHLORIDE 0.9 % IV SOLN
1000.0000 mg | Freq: Once | INTRAVENOUS | Status: AC
  Administered 2023-12-23: 1000 mg via INTRAVENOUS
  Filled 2023-12-23: qty 16

## 2023-12-23 MED ORDER — HEPARIN SOD (PORK) LOCK FLUSH 100 UNIT/ML IV SOLN
500.0000 [IU] | Freq: Once | INTRAVENOUS | Status: AC | PRN
  Administered 2023-12-23: 500 [IU]

## 2023-12-23 NOTE — Telephone Encounter (Signed)
 Pt called and left voicemail to call back  SL Student CMA

## 2023-12-23 NOTE — Progress Notes (Signed)
 Diagnosis: Polymyalgia rheumatica   Provider:  Mannam, Praveen MD  Procedure: IV Infusion  IV Type: Port a Cath, IV Location: R Chest  Solumedrol (Methylprednisolone ), Dose: 1000 mg  Infusion Start Time: 1324  Infusion Stop Time: 1426  Post Infusion IV Care: Port- A- cath deaccessed. Flushed and heparin  locked  Discharge: Condition: Good, Destination: Home . AVS Provided  Performed by:  Lendel Quant, RN

## 2023-12-23 NOTE — Telephone Encounter (Signed)
 CRITICAL VALUE STICKER  CRITICAL VALUE: Vit D 114.06  RECEIVER (on-site recipient of call): Megan Brewer  DATE & TIME NOTIFIED: 12/23/23 10:11am  MESSENGER (representative from lab): Sah  MD NOTIFIED: Dr. Jalene Mayor   TIME OF NOTIFICATION: 12/23/23 10:11am  RESPONSE:

## 2023-12-23 NOTE — Telephone Encounter (Signed)
 She wasn't having any symptoms during our exam. Ca WNL. Please have her stop Vit D supplementation and let's recheck this in 4 weeks. Plz sched and order. Thx.

## 2023-12-24 ENCOUNTER — Other Ambulatory Visit: Payer: Self-pay

## 2023-12-24 DIAGNOSIS — F444 Conversion disorder with motor symptom or deficit: Secondary | ICD-10-CM | POA: Diagnosis not present

## 2023-12-24 DIAGNOSIS — R29898 Other symptoms and signs involving the musculoskeletal system: Secondary | ICD-10-CM | POA: Diagnosis not present

## 2023-12-24 DIAGNOSIS — Z789 Other specified health status: Secondary | ICD-10-CM | POA: Diagnosis not present

## 2023-12-24 DIAGNOSIS — R278 Other lack of coordination: Secondary | ICD-10-CM | POA: Diagnosis not present

## 2023-12-24 DIAGNOSIS — Z7409 Other reduced mobility: Secondary | ICD-10-CM | POA: Diagnosis not present

## 2023-12-24 DIAGNOSIS — E559 Vitamin D deficiency, unspecified: Secondary | ICD-10-CM

## 2023-12-24 NOTE — Telephone Encounter (Signed)
 Called pt was advised and lab appt scheduled, labs ordered.

## 2023-12-26 LAB — BASIC METABOLIC PANEL WITH GFR
BUN/Creatinine Ratio: 23 (calc) — ABNORMAL HIGH (ref 6–22)
BUN: 26 mg/dL — ABNORMAL HIGH (ref 7–25)
CO2: 25 mmol/L (ref 20–32)
Calcium: 9.2 mg/dL (ref 8.6–10.4)
Chloride: 104 mmol/L (ref 98–110)
Creat: 1.14 mg/dL — ABNORMAL HIGH (ref 0.60–1.00)
Glucose, Bld: 97 mg/dL (ref 65–99)
Potassium: 4.7 mmol/L (ref 3.5–5.3)
Sodium: 139 mmol/L (ref 135–146)
eGFR: 50 mL/min/{1.73_m2} — ABNORMAL LOW (ref >=60)

## 2023-12-26 LAB — VITAMIN B1: Vitamin B1 (Thiamine): 359 nmol/L — ABNORMAL HIGH (ref 8–30)

## 2023-12-27 ENCOUNTER — Emergency Department (HOSPITAL_COMMUNITY)
Admission: EM | Admit: 2023-12-27 | Discharge: 2023-12-27 | Disposition: A | Discharge status: Home / Self Care | Attending: Emergency Medicine | Admitting: Emergency Medicine

## 2023-12-27 ENCOUNTER — Other Ambulatory Visit: Payer: Self-pay

## 2023-12-27 ENCOUNTER — Emergency Department (HOSPITAL_COMMUNITY)

## 2023-12-27 DIAGNOSIS — I959 Hypotension, unspecified: Secondary | ICD-10-CM | POA: Diagnosis not present

## 2023-12-27 DIAGNOSIS — W19XXXA Unspecified fall, initial encounter: Secondary | ICD-10-CM | POA: Diagnosis not present

## 2023-12-27 DIAGNOSIS — R55 Syncope and collapse: Secondary | ICD-10-CM | POA: Diagnosis not present

## 2023-12-27 DIAGNOSIS — N390 Urinary tract infection, site not specified: Secondary | ICD-10-CM | POA: Insufficient documentation

## 2023-12-27 DIAGNOSIS — W050XXA Fall from non-moving wheelchair, initial encounter: Secondary | ICD-10-CM | POA: Insufficient documentation

## 2023-12-27 DIAGNOSIS — I509 Heart failure, unspecified: Secondary | ICD-10-CM | POA: Diagnosis not present

## 2023-12-27 DIAGNOSIS — I951 Orthostatic hypotension: Secondary | ICD-10-CM | POA: Diagnosis not present

## 2023-12-27 DIAGNOSIS — Z0389 Encounter for observation for other suspected diseases and conditions ruled out: Secondary | ICD-10-CM | POA: Diagnosis not present

## 2023-12-27 LAB — URINALYSIS, W/ REFLEX TO CULTURE (INFECTION SUSPECTED)
Bilirubin Urine: NEGATIVE
Glucose, UA: NEGATIVE mg/dL
Hgb urine dipstick: NEGATIVE
Ketones, ur: NEGATIVE mg/dL
Nitrite: NEGATIVE
Protein, ur: NEGATIVE mg/dL
Specific Gravity, Urine: 1.008 (ref 1.005–1.030)
pH: 6 (ref 5.0–8.0)

## 2023-12-27 LAB — CBC WITH DIFFERENTIAL/PLATELET
Abs Immature Granulocytes: 0.04 10*3/uL (ref 0.00–0.07)
Basophils Absolute: 0.1 10*3/uL (ref 0.0–0.1)
Basophils Relative: 1 %
Eosinophils Absolute: 0 10*3/uL (ref 0.0–0.5)
Eosinophils Relative: 0 %
HCT: 41.5 % (ref 36.0–46.0)
Hemoglobin: 13.5 g/dL (ref 12.0–15.0)
Immature Granulocytes: 1 %
Lymphocytes Relative: 15 %
Lymphs Abs: 1.1 10*3/uL (ref 0.7–4.0)
MCH: 30.1 pg (ref 26.0–34.0)
MCHC: 32.5 g/dL (ref 30.0–36.0)
MCV: 92.6 fL (ref 80.0–100.0)
Monocytes Absolute: 0.6 10*3/uL (ref 0.1–1.0)
Monocytes Relative: 9 %
Neutro Abs: 5.2 10*3/uL (ref 1.7–7.7)
Neutrophils Relative %: 74 %
Platelets: 167 10*3/uL (ref 150–400)
RBC: 4.48 MIL/uL (ref 3.87–5.11)
RDW: 12.9 % (ref 11.5–15.5)
WBC: 7 10*3/uL (ref 4.0–10.5)
nRBC: 0 % (ref 0.0–0.2)

## 2023-12-27 LAB — COMPREHENSIVE METABOLIC PANEL WITH GFR
ALT: 72 U/L — ABNORMAL HIGH (ref 0–44)
AST: 58 U/L — ABNORMAL HIGH (ref 15–41)
Albumin: 2.9 g/dL — ABNORMAL LOW (ref 3.5–5.0)
Alkaline Phosphatase: 76 U/L (ref 38–126)
Anion gap: 10 (ref 5–15)
BUN: 28 mg/dL — ABNORMAL HIGH (ref 8–23)
CO2: 25 mmol/L (ref 22–32)
Calcium: 9.1 mg/dL (ref 8.9–10.3)
Chloride: 105 mmol/L (ref 98–111)
Creatinine, Ser: 1.29 mg/dL — ABNORMAL HIGH (ref 0.44–1.00)
GFR, Estimated: 43 mL/min — ABNORMAL LOW (ref >60)
Glucose, Bld: 114 mg/dL — ABNORMAL HIGH (ref 70–99)
Potassium: 4.5 mmol/L (ref 3.5–5.1)
Sodium: 140 mmol/L (ref 135–145)
Total Bilirubin: <0.2 mg/dL (ref 0.0–1.2)
Total Protein: 6.8 g/dL (ref 6.5–8.1)

## 2023-12-27 LAB — RESP PANEL BY RT-PCR (RSV, FLU A&B, COVID)  RVPGX2
Influenza A by PCR: NEGATIVE
Influenza B by PCR: NEGATIVE
Resp Syncytial Virus by PCR: NEGATIVE
SARS Coronavirus 2 by RT PCR: NEGATIVE

## 2023-12-27 LAB — PROTIME-INR
INR: 0.9 (ref 0.8–1.2)
Prothrombin Time: 12.1 s (ref 11.4–15.2)

## 2023-12-27 LAB — I-STAT CG4 LACTIC ACID, ED: Lactic Acid, Venous: 1.5 mmol/L (ref 0.5–1.9)

## 2023-12-27 MED ORDER — MIDODRINE HCL 5 MG PO TABS
10.0000 mg | ORAL_TABLET | Freq: Three times a day (TID) | ORAL | Status: DC
  Administered 2023-12-27: 10 mg via ORAL
  Filled 2023-12-27: qty 2

## 2023-12-27 MED ORDER — METRONIDAZOLE 500 MG/100ML IV SOLN
500.0000 mg | Freq: Two times a day (BID) | INTRAVENOUS | Status: DC
  Administered 2023-12-27: 500 mg via INTRAVENOUS
  Filled 2023-12-27: qty 100

## 2023-12-27 MED ORDER — FLUDROCORTISONE ACETATE 0.1 MG PO TABS
100.0000 ug | ORAL_TABLET | Freq: Two times a day (BID) | ORAL | Status: DC
  Filled 2023-12-27: qty 1

## 2023-12-27 MED ORDER — SODIUM CHLORIDE 0.9 % IV SOLN
2.0000 g | Freq: Once | INTRAVENOUS | Status: AC
  Administered 2023-12-27: 2 g via INTRAVENOUS
  Filled 2023-12-27: qty 20

## 2023-12-27 MED ORDER — HEPARIN SOD (PORK) LOCK FLUSH 100 UNIT/ML IV SOLN: 500.0000 [IU] | Freq: Once | INTRAVENOUS | Status: DC

## 2023-12-27 MED ORDER — SODIUM CHLORIDE 0.9 % IV BOLUS
2000.0000 mL | Freq: Once | INTRAVENOUS | Status: AC
  Administered 2023-12-27: 2000 mL via INTRAVENOUS

## 2023-12-27 MED ORDER — SODIUM CHLORIDE 0.9 % IV BOLUS
500.0000 mL | Freq: Once | INTRAVENOUS | Status: AC
  Administered 2023-12-27: 500 mL via INTRAVENOUS

## 2023-12-27 MED ORDER — FOSFOMYCIN TROMETHAMINE 3 G PO PACK: 3.0000 g | PACK | Freq: Once | ORAL | 0 refills | Status: AC

## 2023-12-27 MED ORDER — HEPARIN SOD (PORK) LOCK FLUSH 100 UNIT/ML IV SOLN
500.0000 [IU] | Freq: Once | INTRAVENOUS | Status: AC
  Administered 2023-12-27: 500 [IU]
  Filled 2023-12-27: qty 5

## 2023-12-27 MED ORDER — SODIUM CHLORIDE 0.9 % IV SOLN
2.0000 g | Freq: Once | INTRAVENOUS | Status: DC
  Filled 2023-12-27: qty 20

## 2023-12-27 NOTE — Sepsis Progress Note (Signed)
 Notified bedside nurse of need to administer antibiotics. Responded that IV team is trying to get an IV placed after bedside RN tried 3 times.

## 2023-12-27 NOTE — ED Notes (Signed)
 Pt refused straight cath for urine, would like to receive fluids and use bedpan

## 2023-12-27 NOTE — ED Notes (Signed)
 Pt refused rocephin  at this time due to diarrhea. MD updated

## 2023-12-27 NOTE — Consult Note (Addendum)
 Medical Consultation  Megan Brewer  ZOX:[id number]  DOB: December 06, 1945  DOA: 12/27/2023 PCP: Jobe Mulder, DO   Requesting physician: Dr. Bryna Car Date of consultation: 12/27/2023 Reason for consultation: Fall while returning from the commode to her wheelchair  HPI:  Megan Brewer is an 78 y.o. female with a long and complicated history.  Her husband is at bedside and collaborates the history that she gives.  She and her husband moved to [place]  about 5 years ago.  She has had lung cancer and is status post right middle and lower lobectomy, has had a gastric bypass, rib fractures and has a left bundle branch block.  She has hypogammaglobulinemia and receives gammaglobulin infusions every 3 weeks, the last being about a week ago.  She has orthostatic hypotension along with chronic pedal edema.  She has had recurrent UTIs and C. difficile colitis in the past.  She has a "movement disorder" that prevents her from ambulating.She is mostly wheelchair-bound but able to transfer independently.  She states after moving to [place]  5 years ago her health has been declining progressively with recurrent UTIs, C. difficile colitis, extensive pedal edema and generalized weakness.  She presents to the ED after she "sank to the ground" while transferring from the commode back to the wheelchair.  Her husband was present at the time and she sustained a skin tear to her right forearm.  She takes midodrine , fludrocortisone  and Demadex .  She states that she has pedal edema up to her thighs.She states that she is unable to wear TED hose anymore because they are too tight and too long for her.  She has attempted to find petite TED hose.  Initially Demadex  was to be taken occasionally for pedal edema and has steadily been increased from daily to 20 mg twice daily.  For the past 3 days she has been feeling dehydrated and has been  drinking a great deal of sweet tea and eating potato chips so her body would hold onto fluid.    In regards to recurrent UTIs, she states that she received a dose of fosfomycin last week and a dose of fosfomycin the week before that.  She does see urology, Dr. Dulcy Gibney.  She states that in the past she used to have delirium when she had a UTI but more recently she has no clear symptoms of a UTI but then is told that she has a UTI and subsequently gets treated.   In the ED: Received 2 L of normal saline for orthostatic hypotension Received Rocephin  for UTI and Flagyl  to prevent C. difficile colitis.  General exam: Elderly female laying in bed, appears comfortable HEENT: oral mucosa moist, pupils equal round and reactive to light, conjunctiva pink Respiratory system: Clear to auscultation.  Cardiovascular system: S1 & S2 heard  Gastrointestinal system: Abdomen soft, non-tender, nondistended. Normal bowel sounds   Extremities: No cyanosis, clubbing or edema Psychiatry:  Mood & affect appropriate. Skin: Extensive bruising throughout her body Neurology: She is alert oriented to time place person and situation, cranial nerves II through XII are intact strength intact in all 4 extremities, involuntary movements throughout her body are noted when she sits up and when she stands  Assessment and plan:  Fall related to orthostatic hypotension, recurrent pedal edema exacerbated by being wheelchair-bound - Given 2 L of IV fluids and has improved-I did get her to stand and she was able to stand for me for about 3 minutes-did not have symptoms of orthostasis -  Last 2D echo in 2024 showed an EF of 60 to 65% and grade 1 diastolic dysfunction - No edema on exam today -DC Demadex  20 mg twice daily (and KCl)-she can keep them on hand in case she needs to take them but should not use them on a regular basis -We have placed Ace wrap's on her feet, legs and thighs with light compression - She will be discharged  home from the ED  Recurrent UTIs versus colonization - UA in the ED reveals small leukocytes, few bacteria, 6-10 WBCs.  Urine culture not sent - Given ceftriaxone  and metronidazole  by EDP - Previously has grown out E. coli and Klebsiella pneumonia from her urine - Due to her history of C. difficile colitis antibiotics should be used sparingly - I have referred her to infectious disease   -Until she gets to infectious disease, I have given her prescription for fosfomycin in case she has a symptomatic UTI - Follows with urology, Dr. Dulcy Gibney  Elevated creatinine-appears to be CKD stage IIIa - Creatinine 1.29 - Would like to see if there is a downtrending of creatinine with holding the Demadex  that she has been taking twice a day  Functional movement disorder - Involuntary movements of her arms and legs noted when she stands-her husband is at bedside and states that this is her baseline - She follows with neurology, PT and OT    Hypogammaglobulinemia - Receives gammaglobulin infusions every 3 weeks and received her last infusion about a week ago     Past Medical History:  Diagnosis Date   Anemia    Arachnoiditis    Bipolar disorder (HCC)    Cataract    removed   Chronic post-thoracotomy pain    CKD (chronic kidney disease)    GERD (gastroesophageal reflux disease)    Hypogammaglobulinemia (HCC)    Hypotension    Hypothyroid    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HCC)    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HCC)    Low blood pressure    Lung cancer (HCC) 2011, 2014   Memory loss    Migraine    Opioid abuse (HCC)    Osteoporosis    PMR (polymyalgia rheumatica) (HCC) 03/17/2022   Presence of neurostimulator    Restless legs    Sleep apnea     Past Surgical History:  Procedure Laterality Date   ABDOMINAL HYSTERECTOMY     CARPAL TUNNEL RELEASE     CATARACT EXTRACTION Bilateral 2019   CHOLECYSTECTOMY     COLONOSCOPY  2015   [facility]   CT LUNG SCREENING  2018   DENTAL SURGERY   06/17/2021   4 teeth rmoved   DG  BONE DENSITY ([facility] HX)  2018   DIAGNOSTIC MAMMOGRAM  2019   ESOPHAGOGASTRODUODENOSCOPY (EGD) WITH PROPOFOL  N/A 09/23/2023   Procedure: ESOPHAGOGASTRODUODENOSCOPY (EGD) WITH PROPOFOL ;  Surgeon: Ace Holder, MD;  Location: [facility];  Service: Gastroenterology;  Laterality: N/A;   GASTRIC BYPASS     LUNG CANCER SURGERY  02/2010   MULTIPLE TOOTH EXTRACTIONS     PORT A CATH REVISION Right 04/2021   PORT-A-CATH REMOVAL Left 02/07/2021   Procedure: REMOVAL PORT-A-CATH;  Surgeon: Kinsinger, Alphonso Aschoff, MD;  Location: [facility];  Service: General;  Laterality: Left;  60 MINUTES ROOM 4   SAVORY DILATION N/A 09/23/2023   Procedure: SAVORY DILATION;  Surgeon: Ace Holder, MD;  Location: [facility];  Service: Gastroenterology;  Laterality: N/A;   SPINAL CORD STIMULATOR IMPLANT      Social History:  reports that she has never smoked. She has never used smokeless tobacco. She reports that she does not currently use alcohol. She reports that she does not use drugs.  Allergies  Allergen Reactions   Butorphanol Hives, Other (See Comments) and Swelling    Cerebral pain, "like someone is brushing my brain with a brillo pad"  Other Reaction(s): Not available   Erythromycin Base Diarrhea, Itching and Other (See Comments)    Severe diarrhea   Propoxyphene Nausea Only and Other (See Comments)    Depression, Agitation, Blurred Vision  Other Reaction(s): Not available   Sumatriptan  Nausea And Vomiting, Other (See Comments) and Tinitus    Ringing in the ears, migraines are worse  Other Reaction(s): Not available   Tetracycline Nausea Only, Other (See Comments) and Tinitus    Causes ringing in ears, dizziness, migraine, nausea  Other Reaction(s): Not available   Ciprofloxacin Hives, Other (See Comments) and Tinitus    Headache, dizziness, ringing in the ears  Other Reaction(s): Not available   Corticosteroids Other (See Comments)    12/02/2016  interview by ZOX: Oral dosing causes migraines/nausea/tinnitus. Prev tolerated IV and intranasal admin w/o difficulty.   Doxycycline  Other (See Comments)    unknown  Other Reaction(s): Not available   Cefixime Other (See Comments)    Headache  Other Reaction(s): Not available   Isometheptene-Dichloral-Apap Other (See Comments)    Unknown, mood liability   Ketorolac      12/02/2016 interview by WRU: Oral dosing prednisone /corticosteroids causes migraines/nausea/tinnitus. Prev tolerated IV and intranasal admin w/o difficulty.  Other Reaction(s): Not available   Prednisone  Other (See Comments)    Migraine, dizzy, ringing in ears-can take it IV  12/02/2016 interview by [name]: Oral prednisone  dosing causes migraines/nausea/tinnitus. Prev tolerated IV and intranasal admin w/o difficulty.  Other Reaction(s): Not available   Promethazine  Other (See Comments)    causes severe abdominal pain when taken IV; however she can take PO or IM  Other Reaction(s): Not available   Tape Other (See Comments)    Adhesive tapes-patient denies    Family History  Problem Relation Age of Onset   Hypertension Mother    GER disease Mother    Dementia Mother    Osteoporosis Mother    Arthritis Mother    Bipolar disorder Mother    Parkinson's disease Father    Congestive Heart Failure Father    Pneumonia Father    Arthritis Father    Dementia Father    Depression Father    Asthma Sister    Depression Sister    Bipolar disorder Brother    Asthma Daughter    Colon cancer Neg Hx    Esophageal cancer Neg Hx    Stomach cancer Neg Hx    Rectal cancer Neg Hx    Allergies as of 12/27/2023       Reactions   Butorphanol Hives, Other (See Comments), Swelling   Cerebral pain, "like someone is brushing my brain with a brillo pad" Other Reaction(s): Not available   Erythromycin Base Diarrhea, Itching, Other (See Comments)   Severe diarrhea   Propoxyphene Nausea Only, Other (See Comments)   Depression,  Agitation, Blurred Vision Other Reaction(s): Not available   Sumatriptan  Nausea And Vomiting, Other (See Comments), Tinitus   Ringing in the ears, migraines are worse Other Reaction(s): Not available   Tetracycline Nausea Only, Other (See Comments), Tinitus   Causes ringing in ears, dizziness, migraine, nausea Other Reaction(s): Not available   Ciprofloxacin Hives, Other (See Comments), Tinitus  Headache, dizziness, ringing in the ears Other Reaction(s): Not available   Corticosteroids Other (See Comments)   12/02/2016 interview by Ritta Chessman: Oral dosing causes migraines/nausea/tinnitus. Prev tolerated IV and intranasal admin w/o difficulty.   Doxycycline  Other (See Comments)   unknown Other Reaction(s): Not available   Cefixime Other (See Comments)   Headache Other Reaction(s): Not available   Isometheptene-dichloral-apap Other (See Comments)   Unknown, mood liability   Ketorolac     12/02/2016 interview by WUJ: Oral dosing prednisone /corticosteroids causes migraines/nausea/tinnitus. Prev tolerated IV and intranasal admin w/o difficulty. Other Reaction(s): Not available   Prednisone  Other (See Comments)   Migraine, dizzy, ringing in ears-can take it IV 12/02/2016 interview by [name]: Oral prednisone  dosing causes migraines/nausea/tinnitus. Prev tolerated IV and intranasal admin w/o difficulty. Other Reaction(s): Not available   Promethazine  Other (See Comments)   causes severe abdominal pain when taken IV; however she can take PO or IM Other Reaction(s): Not available   Tape Other (See Comments)   Adhesive tapes-patient denies        Medication List     STOP taking these medications    potassium chloride  10 MEQ tablet Commonly known as: KLOR-CON    torsemide  20 MG tablet Commonly known as: DEMADEX        TAKE these medications    ADVIL  ALLERGY SINUS PO Take 1 tablet by mouth daily.   CALCIUM  PO Take 1 tablet by mouth in the morning and at bedtime.   cycloSPORINE  0.05  % ophthalmic emulsion Commonly known as: RESTASIS  Place 1 drop into both eyes 4 (four) times daily as needed.   dexlansoprazole  60 MG capsule Commonly known as: DEXILANT  Take 1 capsule (60 mg total) by mouth daily.   diclofenac  Sodium 1 % Gel Commonly known as: Voltaren  Apply 2 g topically 4 (four) times daily. What changed:  when to take this reasons to take this   dicyclomine  20 MG tablet Commonly known as: BENTYL  Take 20 mg by mouth in the morning and at bedtime.   famotidine  20 MG tablet Commonly known as: PEPCID  Take 1 tablet (20 mg total) by mouth 2 (two) times daily.   ferrous sulfate  325 (65 FE) MG tablet Take 650 mg by mouth daily with breakfast.   fludrocortisone  0.1 MG tablet Commonly known as: FLORINEF  TAKE ONE TABLET BY MOUTH TWICE DAILY   fosfomycin 3 g Pack Commonly known as: MONUROL  Take 3 g by mouth once for 1 dose.   Gammagard 20 GM/200ML Soln Generic drug: Immune Globulin (Human) Inject 40 g into the vein every 21 ( twenty-one) days. INFUSE 40G INTRAVENOUSLY EVERY 3 WEEKS   Kevzara 200 MG/1. Soaj Generic drug: Sarilumab Inject 1.14 mLs into the skin every 14 (fourteen) days.   levothyroxine  75 MCG tablet Commonly known as: SYNTHROID  TAKE ONE (1) TABLET BY MOUTH EACH DAY 30MINUTES BEFORE BREAKFAST ON EMPTY STOMACH   lidocaine  4 % Place 1 patch onto the skin daily. What changed:  when to take this reasons to take this   magnesium  oxide 400 MG tablet Commonly known as: MAG-OX Take 400 mg by mouth daily.   methylphenidate  10 MG tablet Commonly known as: Ritalin  Take 1 tablet (10 mg total) by mouth daily.   midodrine  10 MG tablet Commonly known as: PROAMATINE  TAKE ONE (1) TABLET BY MOUTH 3 TIMES DAILY   Myrbetriq  50 MG Tb24 tablet Generic drug: mirabegron  ER Take 50 mg by mouth every evening.   naloxone  4 MG/0.1ML Liqd nasal spray kit Commonly known as: NARCAN  Spray up nostril  in event of opioid overdose.   pregabalin  50 MG  capsule Commonly known as: LYRICA  Take 1 capsule (50 mg total) by mouth 3 (three) times daily.   Prolia  60 MG/ML Sosy injection Generic drug: denosumab  Pt to get at office.  Appt is 08/27/23   sucralfate  1 GM/10ML suspension Commonly known as: CARAFATE  SMARTSIG:Milliliter(s) By Mouth   tamsulosin  0.4 MG Caps capsule Commonly known as: FLOMAX  Take 0.4 mg by mouth daily.   triamcinolone  cream 0.1 % Commonly known as: KENALOG  Apply 1 application  topically 2 (two) times daily as needed (rash).   Trulance  3 MG Tabs Generic drug: Plecanatide  TAKE ONE TABLET BY MOUTH EVERY DAY   ZINC  OXIDE (TOPICAL) 10 % Crea Apply 1 application topically 2 (two) times daily as needed (rash).        Prior to Admission medications    Physical Exam: Blood pressure (!) 124/59, pulse 70, resp. rate 16, height 5\' 4"  (1.626 m), weight 57.6 kg, SpO2 100%. @VITALS2 @ [employer]   12/27/23 1046  Weight: 57.6 kg    Intake/Output Summary (Last 24 hours) at 12/27/2023 1807 Last data filed at 12/27/2023 1522 Gross per 24 hour  Intake 2000 ml  Output --  Net 2000 ml    Labs on Admission:  Basic Metabolic Panel: Recent Labs  Lab 12/22/23 1506 12/27/23 1129  NA 139 140  K 4.7 4.5  CL 104 105  CO2 25 25  GLUCOSE 97 114*  BUN 26* 28*  CREATININE 1.14* 1.29*  CALCIUM  9.2 9.1  MG 2.1  --    Liver Function Tests: Recent Labs  Lab 12/27/23 1129  AST 58*  ALT 72*  ALKPHOS 76  BILITOT <0.2  PROT 6.8  ALBUMIN  2.9*   No results for input(s): "LIPASE", "AMYLASE" in the last 168 hours. No results for input(s): "AMMONIA" in the last 168 hours. CBC: Recent Labs  Lab 12/27/23 1129  WBC 7.0  NEUTROABS 5.2  HGB 13.5  HCT 41.5  MCV 92.6  PLT 167   Cardiac Enzymes: No results for input(s): "CKTOTAL", "CKMB", "CKMBINDEX", "TROPONINI" in the last 168 hours. BNP: Invalid input(s): "POCBNP" CBG: No results for input(s): "GLUCAP" in the last 168 hours.  Radiological Exams on  Admission: DG Chest Port 1 View Result Date: 12/27/2023 CLINICAL DATA:  Questionable sepsis EXAM: PORTABLE CHEST 1 VIEW COMPARISON:  12/12/2023 FINDINGS: Remote right rib surgical or posttraumatic defect. Right Port-A-Cath tip at high right atrium. Spinal stimulators. Numerous leads and wires project over the chest. Midline trachea. Normal heart size. No pleural effusion or pneumothorax. Clear lungs. IMPRESSION: No evidence of pneumonia or explanation for sepsis. Electronically Signed   By: Lore Rode M.D.   On: 12/27/2023 12:45    EKG: Independently reviewed.  Left bundle branch block   Sedalia Dacosta, MD [facility] 12/27/2023, 6:07 PM

## 2023-12-27 NOTE — Sepsis Progress Note (Signed)
 Sepsis protocol is being followed by [facility].

## 2023-12-27 NOTE — ED Triage Notes (Signed)
 Pt had mechanical fall transfering from [facility] to toilet. When EMS arrived BP 72/30 SPO2 100 HR78. PT refused PIV with ems. Pt has port she would llike to be accessed. Has skin tear from fall

## 2023-12-27 NOTE — Discharge Instructions (Signed)
 Do not take your Demadex  as discussed with the hospitalist.  Follow closely with your primary care physician as well as infectious disease.  If you develop new or worsening symptoms then return to the ER.

## 2023-12-27 NOTE — ED Provider Notes (Signed)
 Care transferred to me.  The patient has been seen by the hospitalist, Dr. Renie Carver.  She has cleared her for discharge, see her note.  Otherwise, patient will be instructed to hold the Demadex  as this gust by the hospitalist and follow-up closely with her PCP.  She feels well enough for discharge.  Discussed that she can and may return at any time, especially if she develops new or worsening symptoms.   Jerilynn Montenegro, MD 12/27/23 [phone]

## 2023-12-27 NOTE — ED Provider Notes (Signed)
 [place] EMERGENCY DEPARTMENT AT [facility] Provider Note   CSN: [id number] Arrival date & time: 12/27/23  1032     History {Add pertinent medical, surgical, social history, OB history to HPI:1} Chief Complaint  Patient presents with   Hypotension   Fall    Megan Brewer is a 78 y.o. female.  Patient has a history of congestive heart failure and chronic UTIs with hypotension.  She fell getting out of the wheelchair.  Patient states that she has been feeling weak and dizzy.  She was recently treated with fosfomycin by her primary care doctor for urinary tract infection.  The history is provided by the patient and medical records. No language interpreter was used.  Fall This is a new problem. The current episode started 3 to 5 hours ago. The problem occurs rarely. The problem has been resolved. Pertinent negatives include no chest pain, no abdominal pain and no headaches. Nothing aggravates the symptoms. Nothing relieves the symptoms. She has tried nothing for the symptoms.       Home Medications Prior to Admission medications   Medication Sig Start Date End Date Taking? Authorizing Provider  CALCIUM  PO Take 1 tablet by mouth in the morning and at bedtime.   Yes [provider]  Chlorpheniramine-PSE-Ibuprofen  (ADVIL  ALLERGY SINUS PO) Take 1 tablet by mouth daily.   Yes [provider]  cycloSPORINE  (RESTASIS ) 0.05 % ophthalmic emulsion Place 1 drop into both eyes 4 (four) times daily as needed.   Yes [provider]  denosumab  (PROLIA ) 60 MG/ML SOSY injection Pt to get at office.  Appt is 08/27/23 07/14/23  Yes Wendling, Shellie Dials, DO  dexlansoprazole  (DEXILANT ) 60 MG capsule Take 1 capsule (60 mg total) by mouth daily. 07/26/23  Yes Edmonia Gottron, PA-C  diclofenac  Sodium (VOLTAREN ) 1 % GEL Apply 2 g topically 4 (four) times daily. Patient taking differently: Apply 2 g topically 4 (four) times daily as needed. 07/27/23  Yes Sonnie Dusky, PA-C  dicyclomine  (BENTYL ) 20 MG tablet Take 20 mg by mouth in the morning and at bedtime. 09/04/23  Yes [provider]  famotidine  (PEPCID ) 20 MG tablet Take 1 tablet (20 mg total) by mouth 2 (two) times daily. 09/20/23  Yes Edmonia Gottron, PA-C  ferrous sulfate  325 (65 FE) MG tablet Take 650 mg by mouth daily with breakfast.   Yes [provider]  fludrocortisone  (FLORINEF ) 0.1 MG tablet TAKE ONE TABLET BY MOUTH TWICE DAILY 11/22/23  Yes Wendling, Shellie Dials, DO  GAMMAGARD 20 GM/200ML SOLN Inject 40 g into the vein every 21 ( twenty-one) days. INFUSE 40G INTRAVENOUSLY EVERY 3 WEEKS 02/27/22  Yes [provider]  KEVZARA 200 MG/1. SOAJ Inject 1.14 mLs into the skin every 14 (fourteen) days. 09/09/22  Yes [provider]  levothyroxine  (SYNTHROID ) 75 MCG tablet TAKE ONE (1) TABLET BY MOUTH EACH DAY BEFORE BREAKFAST ON EMPTY STOMACH 07/19/23  Yes Jobe Mulder, DO  lidocaine  4 % Place 1 patch onto the skin daily. Patient taking differently: Place 1 patch onto the skin daily as needed. 07/08/23  Yes Carin Charleston, MD  magnesium  oxide (MAG-OX) 400 MG tablet Take 400 mg by mouth daily.    Yes [provider]  midodrine  (PROAMATINE ) 10 MG tablet TAKE ONE (1) TABLET BY MOUTH 3 TIMES DAILY 08/09/23  Yes Jobe Mulder, DO  MYRBETRIQ  50 MG TB24 tablet Take 50 mg by mouth every evening. 10/07/22  Yes [provider]  naloxone  (NARCAN ) nasal spray 4 mg/0.1 mL Spray up nostril in event of opioid overdose. 06/28/23  Yes Jobe Mulder, DO  potassium chloride  (KLOR-CON ) 10 MEQ tablet Take 1 tablet (10 mEq total) by mouth in the morning, at noon, in the evening, and at bedtime. 11/01/23  Yes Jobe Mulder, DO  pregabalin  (LYRICA ) 50 MG capsule Take 1 capsule (50 mg total) by mouth 3 (three) times daily. 07/22/23  Yes Jobe Mulder, DO  sucralfate  (CARAFATE ) 1 GM/10ML suspension  SMARTSIG:Milliliter(s) By Mouth 12/13/23  Yes [provider]  tamsulosin  (FLOMAX ) 0.4 MG CAPS capsule Take 0.4 mg by mouth daily. 11/25/23  Yes [provider]  torsemide  (DEMADEX ) 20 MG tablet Take 20 mg by mouth 2 (two) times daily.   Yes [provider]  triamcinolone  cream (KENALOG ) 0.1 % Apply 1 application  topically 2 (two) times daily as needed (rash).   Yes [provider]  TRULANCE  3 MG TABS TAKE ONE TABLET BY MOUTH EVERY DAY 12/16/23  Yes Santina Cull R, PA-C  ZINC  OXIDE, TOPICAL, 10 % CREA Apply 1 application topically 2 (two) times daily as needed (rash). 02/14/21  Yes Audria Leather, MD  methylphenidate  (RITALIN ) 10 MG tablet Take 1 tablet (10 mg total) by mouth daily. Patient not taking: Reported on 12/27/2023 09/15/23 09/14/24  Liam Redhead, MD  vancomycin  (VANCOCIN ) 125 MG capsule Take 1 capsule (125 mg total) by mouth daily. Patient not taking: Reported on 12/27/2023 12/03/23   Barbee Lew, MD      Allergies    Butorphanol, Erythromycin base, Propoxyphene, Sumatriptan , Tetracycline, Ciprofloxacin, Corticosteroids, Doxycycline , Cefixime, Isometheptene-dichloral-apap, Ketorolac , Prednisone , Promethazine , and Tape    Review of Systems   Review of Systems  Constitutional:  Negative for appetite change and fatigue.  HENT:  Negative for congestion, ear discharge and sinus pressure.   Eyes:  Negative for discharge.  Respiratory:  Negative for cough.   Cardiovascular:  Negative for chest pain.  Gastrointestinal:  Negative for abdominal pain and diarrhea.  Genitourinary:  Negative for frequency and hematuria.  Musculoskeletal:  Negative for back pain.  Skin:  Negative for rash.  Neurological:  Positive for weakness. Negative for seizures and headaches.  Psychiatric/Behavioral:  Negative for hallucinations.     Physical Exam Updated Vital Signs BP (!) 124/59   Pulse 70   Resp 16   Ht 5\' 4"  (1.626 m)   Wt 57.6 kg   SpO2 100%    BMI 21.80 kg/m  Physical Exam Vitals and nursing note reviewed.  Constitutional:      Appearance: She is well-developed.  HENT:     Head: Normocephalic.     Nose: Nose normal.  Eyes:     General: No scleral icterus.    Conjunctiva/sclera: Conjunctivae normal.  Neck:     Thyroid : No thyromegaly.  Cardiovascular:     Rate and Rhythm: Normal rate and regular rhythm.     Heart sounds: No murmur heard.    No friction rub. No gallop.  Pulmonary:     Breath sounds: No stridor. No wheezing or rales.  Chest:     Chest wall: No tenderness.  Abdominal:     General: There is no distension.     Tenderness: There is no abdominal tenderness. There is no rebound.  Musculoskeletal:        General: Normal range of motion.     Cervical back: Neck supple.  Lymphadenopathy:     Cervical: No cervical adenopathy.  Skin:  Findings: No erythema or rash.  Neurological:     Mental Status: She is alert and oriented to person, place, and time.     Motor: No abnormal muscle tone.     Coordination: Coordination normal.  Psychiatric:        Behavior: Behavior normal.     ED Results / Procedures / Treatments   Labs (all labs ordered are listed, but only abnormal results are displayed) Labs Reviewed  COMPREHENSIVE METABOLIC PANEL WITH GFR - Abnormal; Notable for the following components:      Result Value   Glucose, Bld 114 (*)    BUN 28 (*)    Creatinine, Ser 1.29 (*)    Albumin  2.9 (*)    AST 58 (*)    ALT 72 (*)    GFR, Estimated 43 (*)    All other components within normal limits  URINALYSIS, W/ REFLEX TO CULTURE (INFECTION SUSPECTED) - Abnormal; Notable for the following components:   Leukocytes,Ua SMALL (*)    Bacteria, UA FEW (*)    All other components within normal limits  RESP PANEL BY RT-PCR (RSV, FLU A&B, COVID)  RVPGX2  CULTURE, BLOOD (ROUTINE X 2)  CULTURE, BLOOD (ROUTINE X 2)  URINE CULTURE  CBC WITH DIFFERENTIAL/PLATELET  PROTIME-INR  I-STAT CG4 LACTIC ACID, ED   I-STAT CG4 LACTIC ACID, ED    EKG None  Radiology DG Chest Port 1 View Result Date: 12/27/2023 CLINICAL DATA:  Questionable sepsis EXAM: PORTABLE CHEST 1 VIEW COMPARISON:  12/12/2023 FINDINGS: Remote right rib surgical or posttraumatic defect. Right Port-A-Cath tip at high right atrium. Spinal stimulators. Numerous leads and wires project over the chest. Midline trachea. Normal heart size. No pleural effusion or pneumothorax. Clear lungs. IMPRESSION: No evidence of pneumonia or explanation for sepsis. Electronically Signed   By: Lore Rode M.D.   On: 12/27/2023 12:45    Procedures Procedures  {Document cardiac monitor, telemetry assessment procedure when appropriate:1}  Medications Ordered in ED Medications  cefTRIAXone  (ROCEPHIN ) 2 g in sodium chloride  0.9 % 100 mL IVPB (2 g Intravenous Not Given 12/27/23 1655)  midodrine  (PROAMATINE ) tablet 10 mg (10 mg Oral Given 12/27/23 1700)  cefTRIAXone  (ROCEPHIN ) 2 g in sodium chloride  0.9 % 100 mL IVPB (2 g Intravenous New Bag/Given 12/27/23 1702)  metroNIDAZOLE  (FLAGYL ) IVPB 500 mg (500 mg Intravenous New Bag/Given 12/27/23 1701)  sodium chloride  0.9 % bolus 2,000 mL (0 mLs Intravenous Stopped 12/27/23 1522)  sodium chloride  0.9 % bolus 500 mL (500 mLs Intravenous New Bag/Given 12/27/23 1702)    ED Course/ Medical Decision Making/ A&P  CRITICAL CARE Performed by: Cheyenne Cotta Total critical care time: 45 minutes Critical care time was exclusive of separately billable procedures and treating other patients. Critical care was necessary to treat or prevent imminent or life-threatening deterioration. Critical care was time spent personally by me on the following activities: development of treatment plan with patient and/or surrogate as well as nursing, discussions with consultants, evaluation of patient's response to treatment, examination of patient, obtaining history from patient or surrogate, ordering and performing treatments and  interventions, ordering and review of laboratory studies, ordering and review of radiographic studies, pulse oximetry and re-evaluation of patient's condition.  {Patient was started on the sepsis protocol for the hypotension and urinary tract infection.  Patient refused antibiotics IV because she states she we will get C. difficile.  Patient was given 2 L of normal saline and orthostatic blood pressures were checked.  When patient was standing her blood pressure  dropped down to 50 systolic.     Click here for ABCD2, HEART and other calculatorsREFRESH Note before signing :1}                              Medical Decision Making Amount and/or Complexity of Data Reviewed Labs: ordered. Radiology: ordered.  Risk Prescription drug management.   Patient with hypotension and chronic urinary tract infections.  Patient with significant orthostatic hypotension.  The hospitalist will evaluate the patient for possible admission.  {Document critical care time when appropriate:1} {Document review of labs and clinical decision tools ie heart score, Chads2Vasc2 etc:1}  {Document your independent review of radiology images, and any outside records:1} {Document your discussion with family members, caretakers, and with consultants:1} {Document social determinants of health affecting pt's care:1} {Document your decision making why or why not admission, treatments were needed:1} Final Clinical Impression(s) / ED Diagnoses Final diagnoses:  None    Rx / DC Orders ED Discharge Orders     None

## 2023-12-27 NOTE — ED Notes (Signed)
 Pt does not have IV access and is refusing at this time. She wants her port accessed.

## 2023-12-27 NOTE — ED Notes (Signed)
 Ace wrap applied to BLE

## 2023-12-28 ENCOUNTER — Telehealth: Payer: Self-pay

## 2023-12-28 NOTE — Transitions of Care (Post Inpatient/ED Visit) (Signed)
 12/28/2023  Name: Megan Brewer MRN: [id number] DOB: 08/23/1945  Today's TOC FU Call Status: Today's TOC FU Call Status:: Successful TOC FU Call Completed TOC FU Call Complete Date: 12/28/23 Patient's Name and Date of Birth confirmed.  Transition Care Management Follow-up Telephone Call Date of Discharge: 12/27/23 Discharge Facility: [facility]) Type of Discharge: Emergency Department Reason for ED Visit: Other: (UTI) How have you been since you were released from the hospital?: Better Any questions or concerns?: No  Items Reviewed: Did you receive and understand the discharge instructions provided?: Yes Medications obtained,verified, and reconciled?: Yes (Medications Reviewed) Any new allergies since your discharge?: No Dietary orders reviewed?: NA Do you have support at home?: Yes People in Home [RPT]: spouse  Medications Reviewed Today: Medications Reviewed Today     Reviewed by Darrall Ellison, LPN (Licensed Practical Nurse) on 12/28/23 at 1532  Med List Status: <None>   Medication Order Taking? Sig Documenting Provider Last Dose Status Informant  CALCIUM  PO [id number] No Take 1 tablet by mouth in the morning and at bedtime. [provider] 12/27/2023 Morning Active Self           Med Note Nolan Battle, [facility] I   Mon Nov 29, 2023 11:10 PM)    Chlorpheniramine-PSE-Ibuprofen  (ADVIL  ALLERGY SINUS PO) [id number] No Take 1 tablet by mouth daily. [provider] 12/27/2023 Morning Active Self  cycloSPORINE  (RESTASIS ) 0.05 % ophthalmic emulsion [id number] No Place 1 drop into both eyes 4 (four) times daily as needed. [provider] Past Month Active Self           Med Note Nolan Battle, [facility]   Mon Nov 29, 2023 11:09 PM)    denosumab  (PROLIA ) 60 MG/ML SOSY injection [id number] No Pt to get at office.  Appt is 08/27/23 Jobe Mulder, DO 09/29/2023 Active Self           Med Note Abby Hocking Dec 27, 2023  4:07 PM)     denosumab  (PROLIA ) injection 60 mg [id number]   Jobe Mulder, DO  Active   dexlansoprazole  (DEXILANT ) 60 MG capsule [id number] No Take 1 capsule (60 mg total) by mouth daily. Edmonia Gottron, PA-C 12/27/2023 Morning Active Self  diclofenac  Sodium (VOLTAREN ) 1 % GEL [id number] No Apply 2 g topically 4 (four) times daily.  Patient taking differently: Apply 2 g topically 4 (four) times daily as needed.   Sonnie Dusky, PA-C 12/24/2023 Active Self  dicyclomine  (BENTYL ) 20 MG tablet [id number] No Take 20 mg by mouth in the morning and at bedtime. [provider] 12/27/2023 Morning Active Self  famotidine  (PEPCID ) 20 MG tablet [id number] No Take 1 tablet (20 mg total) by mouth 2 (two) times daily. Edmonia Gottron, PA-C 12/27/2023 Morning Active Self  ferrous sulfate  325 (65 FE) MG tablet [id number] No Take 650 mg by mouth daily with breakfast. [provider] 12/27/2023 Morning Active Self           Med Note Joey Muster Oct 09, 2021 11:30 AM)    fludrocortisone  (FLORINEF ) 0.1 MG tablet [id number] No TAKE ONE TABLET BY MOUTH TWICE DAILY Jobe Mulder, DO 12/27/2023 Morning Active Self  GAMMAGARD 20 GM/200ML SOLN [id number] No Inject 40 g into the vein every 21 ( twenty-one) days. INFUSE 40G INTRAVENOUSLY EVERY 3 WEEKS [provider] 12/20/2023 Active Self  KEVZARA 200 MG/1. SOAJ [id number] No Inject 1.14 mLs into the skin every 14 (fourteen) days. [provider] 12/16/2023 Active Self  Med Note Thurman Flores   ZOX Jun 26, 2023  3:14 PM)    levothyroxine  (SYNTHROID ) 75 MCG tablet [id number] No TAKE ONE (1) TABLET BY MOUTH EACH DAY BEFORE BREAKFAST ON EMPTY STOMACH Jobe Mulder, DO 12/27/2023 Morning Active Self  lidocaine  4 % [id number] No Place 1 patch onto the skin daily.  Patient taking differently: Place 1 patch onto the skin daily as needed.   Carin Charleston, MD 12/13/2023 Active Self           Med Note Lajuana Pilar,  Elodie Haines Sep 09, 2023  9:10 AM) Patient reports takes As needed  magnesium  oxide (MAG-OX) 400 MG tablet [id number] No Take 400 mg by mouth daily.  [provider] 12/26/2023 Morning Active Self  methylphenidate  (RITALIN ) 10 MG tablet [id number] No Take 1 tablet (10 mg total) by mouth daily.  Patient not taking: Reported on 12/27/2023   Liam Redhead, MD Not Taking Active Self  midodrine  (PROAMATINE ) 10 MG tablet [id number] No TAKE ONE (1) TABLET BY MOUTH 3 TIMES DAILY Jobe Mulder, DO 12/27/2023 Morning Active Self  MYRBETRIQ  50 MG TB24 tablet [id number] No Take 50 mg by mouth every evening. [provider] 12/26/2023 Bedtime Active Self  naloxone  (NARCAN ) nasal spray 4 mg/0.1 mL [id number] No Spray up nostril in event of opioid overdose. Jobe Mulder, DO Taking Active Self           Med Note Bud Care, SANDY L   Thu Sep 23, 2023  9:11 AM) Never used  pregabalin  (LYRICA ) 50 MG capsule [id number] No Take 1 capsule (50 mg total) by mouth 3 (three) times daily. Jobe Mulder, DO 12/27/2023 Morning Active Self  sucralfate  (CARAFATE ) 1 GM/10ML suspension [id number] No SMARTSIG:Milliliter(s) By Mouth [provider] 12/13/2023 Active Self  tamsulosin  (FLOMAX ) 0.4 MG CAPS capsule [id number] No Take 0.4 mg by mouth daily. [provider] 12/27/2023 Morning Active Self  triamcinolone  cream (KENALOG ) 0.1 % [id number] No Apply 1 application  topically 2 (two) times daily as needed (rash). [provider] Unknown Active Self           Med Note Thurman Flores   Sat Jun 26, 2023  3:22 PM)    TRULANCE  3 MG TABS [id number] No TAKE ONE TABLET BY MOUTH EVERY DAY Edmonia Gottron, PA-C 12/27/2023 Morning Active Self  ZINC  OXIDE, TOPICAL, 10 % CREA [id number] No Apply 1 application topically 2 (two) times daily as needed (rash). Audria Leather, MD Unknown Active Self           Med Note Drexel Gentles, REGEENA   Mon Dec 27, 2023  4:14 PM)               Home Care and Equipment/Supplies: Were Home Health Services Ordered?: NA Any new equipment or medical supplies ordered?: NA  Functional Questionnaire: Do you need assistance with bathing/showering or dressing?: No Do you need assistance with meal preparation?: No Do you need assistance with eating?: No Do you have difficulty maintaining continence: No Do you need assistance with getting out of bed/getting out of a chair/moving?: No Do you have difficulty managing or taking your medications?: No  Follow up appointments reviewed: PCP Follow-up appointment confirmed?: Yes Date of PCP follow-up appointment?: 01/03/24 Follow-up Provider: [facility] Follow-up appointment confirmed?: NA Do you need transportation to your follow-up appointment?: No Do you understand care options if your condition(s) worsen?: Yes-patient verbalized understanding    SIGNATURE Darrall Ellison, LPN [place]  Nurse Health Advisor Direct Dial [phone]

## 2023-12-29 LAB — URINE CULTURE: Culture: >=100000 — AB

## 2023-12-30 ENCOUNTER — Telehealth (HOSPITAL_BASED_OUTPATIENT_CLINIC_OR_DEPARTMENT_OTHER): Payer: Self-pay

## 2023-12-30 NOTE — Telephone Encounter (Signed)
 Post ED Visit - Positive Culture Follow-up  Culture report reviewed by antimicrobial stewardship pharmacist: [facility] []  Court Distance, Pharm.D. []  Skeet Duke, Pharm.D., BCPS AQ-ID []  Leslee Rase, Pharm.D., BCPS [x]  Garland Junk, [address].D., BCPS []  [place], [address].D., BCPS, AAHIVP []  Alcide Aly, Pharm.D., BCPS, AAHIVP []  Jerri Morale, PharmD, BCPS []  Graham Laws, PharmD, BCPS []  Cleda Curly, PharmD, BCPS []  Tamar Fairly, PharmD []  Ballard Levels, PharmD, BCPS []  Ollen Beverage, PharmD  [facility] []  Arlyne Bering, PharmD []  Sherryle Don, PharmD []  [place]elin[place], PharmD []  Delila Felty, Rph []  [place]) Cleora Daft, PharmD []  Augustina Block, PharmD []  Arie Kurtz, PharmD []  Sharlyn Deaner, PharmD []  Agnes Hose, PharmD []  Kendall Pauls, PharmD []  Gladstone Lamer, PharmD []  Armanda Bern, PharmD []  Tera Fellows, PharmD   Positive urine culture Reviewed by Arelia Kub, PA-C  No antibiotics prescribed for current episode as asymptomatic. Fosfomycin given for if pt develops symptoms before ID clinic visit on 6/3 at 3pm. No further patient follow-up is required at this time.  Delena Feil 12/30/2023, 9:25 AM

## 2023-12-30 NOTE — Progress Notes (Signed)
 ED Antimicrobial Stewardship Positive Culture Follow Up   Megan Brewer is an 78 y.o. female who presented to [facility] on 12/27/2023 with a chief complaint of  Chief Complaint  Patient presents with   Hypotension   Fall    Recent Results (from the past 720 hours)  Gastrointestinal Panel by PCR , Stool     Status: None   Collection Time: 12/04/23 12:50 PM   Specimen: Stool  Result Value Ref Range Status   Campylobacter species NOT DETECTED NOT DETECTED Final   Plesimonas shigelloides NOT DETECTED NOT DETECTED Final   Salmonella species NOT DETECTED NOT DETECTED Final   Yersinia enterocolitica NOT DETECTED NOT DETECTED Final   Vibrio species NOT DETECTED NOT DETECTED Final   Vibrio cholerae NOT DETECTED NOT DETECTED Final   Enteroaggregative E coli (EAEC) NOT DETECTED NOT DETECTED Final   Enteropathogenic E coli (EPEC) NOT DETECTED NOT DETECTED Final   Enterotoxigenic E coli (ETEC) NOT DETECTED NOT DETECTED Final   Shiga like toxin producing E coli (STEC) NOT DETECTED NOT DETECTED Final   Shigella/Enteroinvasive E coli (EIEC) NOT DETECTED NOT DETECTED Final   Cryptosporidium NOT DETECTED NOT DETECTED Final   Cyclospora cayetanensis NOT DETECTED NOT DETECTED Final   Entamoeba histolytica NOT DETECTED NOT DETECTED Final   Giardia lamblia NOT DETECTED NOT DETECTED Final   Adenovirus F40/41 NOT DETECTED NOT DETECTED Final   Astrovirus NOT DETECTED NOT DETECTED Final   Norovirus GI/GII NOT DETECTED NOT DETECTED Final   Rotavirus A NOT DETECTED NOT DETECTED Final   Sapovirus (I, II, IV, and V) NOT DETECTED NOT DETECTED Final    Comment: Performed at [facility], [address] [place], [place] [address]  C Difficile Quick Screen w PCR reflex     Status: None   Collection Time: 12/04/23 12:50 PM   Specimen: Stool  Result Value Ref Range Status   C Diff antigen NEGATIVE NEGATIVE Final   C Diff toxin NEGATIVE NEGATIVE Final   C Diff interpretation No C. difficile  detected.  Final    Comment: Performed at [facility], [address], [place], [place] [address]  Urine Culture     Status: None   Collection Time: 12/08/23 11:55 AM   Specimen: Urine  Result Value Ref Range Status   MICRO NUMBER: [id number]  Final   SPECIMEN QUALITY: Adequate  Final   Sample Source NOT GIVEN  Final   STATUS: FINAL  Final   Result:   Final    Mixed genital flora isolated. These superficial bacteria are not indicative of a urinary tract infection. No further organism identification is warranted on this specimen. If clinically indicated, recollect clean-catch, mid-stream urine and transfer  immediately to Urine Culture Transport Tube.   Urine Culture     Status: Abnormal   Collection Time: 12/12/23  1:41 PM   Specimen: Urine, Clean Catch  Result Value Ref Range Status   Specimen Description   Final    URINE, CLEAN CATCH Performed at [facility], [address], [place], [place] [address]    Special Requests   Final    NONE Performed at [facility], [address] [place], [place] [address]    Culture >=100,000 COLONIES/mL ESCHERICHIA COLI (A)  Final   Report Status 12/14/2023 FINAL  Final   Organism ID, Bacteria ESCHERICHIA COLI (A)  Final      Susceptibility   Escherichia coli - MIC*    AMPICILLIN >=32 RESISTANT  Resistant     CEFAZOLIN >=64 RESISTANT Resistant     CEFEPIME  <=0.12 SENSITIVE Sensitive     CEFTRIAXONE  1 SENSITIVE Sensitive     CIPROFLOXACIN <=0.25 SENSITIVE Sensitive     GENTAMICIN <=1 SENSITIVE Sensitive     IMIPENEM <=0.25 SENSITIVE Sensitive     NITROFURANTOIN  32 SENSITIVE Sensitive     TRIMETH /SULFA  <=20 SENSITIVE Sensitive     AMPICILLIN/SULBACTAM >=32 RESISTANT Resistant     PIP/TAZO <=4 SENSITIVE Sensitive ug/mL    * >=100,000 COLONIES/mL ESCHERICHIA COLI  Blood Culture (routine x 2)     Status: None (Preliminary result)   Collection Time: 12/27/23 11:29 AM   Specimen: BLOOD   Result Value Ref Range Status   Specimen Description   Final    BLOOD PORTA CATH Performed at [facility], [address], [place], [place] [address]    Special Requests   Final    BOTTLES DRAWN AEROBIC AND ANAEROBIC Blood Culture adequate volume Performed at [facility], [address], [place], [place] [address]    Culture   Final    NO GROWTH 2 DAYS Performed at [facility], [address] [place], [place] [address]    Report Status PENDING  Incomplete  Urine Culture     Status: Abnormal   Collection Time: 12/27/23  2:45 PM   Specimen: Urine, Clean Catch  Result Value Ref Range Status   Specimen Description   Final    URINE, CLEAN CATCH Performed at [facility], [address] [place], [place] [address]    Special Requests   Final    NONE Performed at [facility], [address] [place], [place] [address]    Culture >=100,000 COLONIES/mL KLEBSIELLA OXYTOCA (A)  Final   Report Status 12/29/2023 FINAL  Final   Organism ID, Bacteria KLEBSIELLA OXYTOCA (A)  Final      Susceptibility   Klebsiella oxytoca - MIC*    AMPICILLIN >=32 RESISTANT Resistant     CEFEPIME  <=0.12 SENSITIVE Sensitive     CEFTRIAXONE  <=0.25 SENSITIVE Sensitive     CIPROFLOXACIN <=0.25 SENSITIVE Sensitive     GENTAMICIN <=1 SENSITIVE Sensitive     IMIPENEM <=0.25 SENSITIVE Sensitive     NITROFURANTOIN  <=16 SENSITIVE Sensitive     TRIMETH /SULFA  <=20 SENSITIVE Sensitive     AMPICILLIN/SULBACTAM 8 SENSITIVE Sensitive     PIP/TAZO <=4 SENSITIVE Sensitive ug/mL    * >=100,000 COLONIES/mL KLEBSIELLA OXYTOCA  Resp panel by RT-PCR (RSV, Flu A&B, Covid) Anterior Nasal Swab     Status: None   Collection Time: 12/27/23  3:06 PM   Specimen: Anterior Nasal Swab  Result Value Ref Range Status   SARS Coronavirus 2 by RT PCR NEGATIVE NEGATIVE Final    Comment: (NOTE) SARS-CoV-2 target nucleic acids are NOT DETECTED.  The  SARS-CoV-2 RNA is generally detectable in upper respiratory specimens during the acute phase of infection. The lowest concentration of SARS-CoV-2 viral copies this assay can detect is 138 copies/mL. A negative result does not preclude SARS-Cov-2 infection and should not be used as the sole basis for treatment or other patient management decisions. A negative result may occur with  improper specimen collection/handling, submission of specimen other than nasopharyngeal swab, presence of viral mutation(s) within the areas targeted by this assay, and inadequate number of viral copies(<138 copies/mL). A negative result must be combined with clinical observations, patient history, and epidemiological information. The expected result is Negative.  Fact Sheet for  Patients:  BloggerCourse.com  Fact Sheet for Healthcare Providers:  SeriousBroker.it  This test is no t yet approved or cleared by the [place]  FDA and  has been authorized for detection and/or diagnosis of SARS-CoV-2 by FDA under an Emergency Use Authorization (EUA). This EUA will remain  in effect (meaning this test can be used) for the duration of the COVID-19 declaration under Section 564(b)(1) of the Act, 21 U.S.C.section 360bbb-3(b)(1), unless the authorization is terminated  or revoked sooner.       Influenza A by PCR NEGATIVE NEGATIVE Final   Influenza B by PCR NEGATIVE NEGATIVE Final    Comment: (NOTE) The Xpert Xpress SARS-CoV-2/FLU/RSV plus assay is intended as an aid in the diagnosis of influenza from Nasopharyngeal swab specimens and should not be used as a sole basis for treatment. Nasal washings and aspirates are unacceptable for Xpert Xpress SARS-CoV-2/FLU/RSV testing.  Fact Sheet for Patients: BloggerCourse.com  Fact Sheet for Healthcare Providers: SeriousBroker.it  This test is not yet approved or  cleared by the [place]  FDA and has been authorized for detection and/or diagnosis of SARS-CoV-2 by FDA under an Emergency Use Authorization (EUA). This EUA will remain in effect (meaning this test can be used) for the duration of the COVID-19 declaration under Section 564(b)(1) of the Act, 21 U.S.C. section 360bbb-3(b)(1), unless the authorization is terminated or revoked.     Resp Syncytial Virus by PCR NEGATIVE NEGATIVE Final    Comment: (NOTE) Fact Sheet for Patients: BloggerCourse.com  Fact Sheet for Healthcare Providers: SeriousBroker.it  This test is not yet approved or cleared by the [place]  FDA and has been authorized for detection and/or diagnosis of SARS-CoV-2 by FDA under an Emergency Use Authorization (EUA). This EUA will remain in effect (meaning this test can be used) for the duration of the COVID-19 declaration under Section 564(b)(1) of the Act, 21 U.S.C. section 360bbb-3(b)(1), unless the authorization is terminated or revoked.  Performed at [facility], [address], [place], [place] [address]     [x]  No treatment indicated  41 YOF who presented on 5/12 after falling from transfer from commode to wheelchair. The patient was noted to have a soft BP in the ED and orthostatics were positive for drops - which the patient has a history of.  Of noting the patient often gets treated for UTI - but often it seems without clear symptoms as documented by Dr. Thelma Fire "she used to have delirium when she had a UTI but more recently she has no clear symptoms of a UTI but then is told that she has a UTI and subsequently gets treated. "  The patient has no UTI symptoms this admission, no systemic symptoms (Afeb, WBC wnl), has known orthostatic hypotension, and BP thus far are ngx2d.   Dr. Thelma Fire set her up to be seen at [facility] on 6/3 and prescribed a dose of Fosfomycin to bridge her if she became  symptomatic with a UTI prior to that visit. Of noting - the patient has several allergies to antibiotics but not true allergies - reactions documented include headache, dizziness, nausea, etc.   Of noting Fosfomycin would not be reliable for this organism if she had a true infection - but she was not instructed to take that medication at this time and does have prior UCx that grew E.coli.   The patient was not felt to have a UTI on her current presentation - so no antibiotics are warranted at this time.   ED  Provider: Rupert Counts, PA-C  Thank you for allowing pharmacy to be a part of this patient's care.  Garland Junk, PharmD, BCPS, BCIDP Infectious Diseases Clinical Pharmacist 12/30/2023 8:41 AM   **Pharmacist phone directory can now be found on [email or web address] ([email or web address]).  Listed under [facility].

## 2024-01-01 LAB — CULTURE, BLOOD (ROUTINE X 2)
Culture: NO GROWTH
Special Requests: ADEQUATE

## 2024-01-03 ENCOUNTER — Encounter: Payer: Self-pay | Admitting: Family Medicine

## 2024-01-03 ENCOUNTER — Ambulatory Visit: Admitting: Family Medicine

## 2024-01-03 ENCOUNTER — Telehealth: Payer: Self-pay

## 2024-01-03 ENCOUNTER — Other Ambulatory Visit (HOSPITAL_COMMUNITY): Payer: Self-pay

## 2024-01-03 VITALS — BP 112/72 | HR 87 | Temp 98.0°F | Resp 16 | Ht 64.0 in | Wt 126.0 lb

## 2024-01-03 DIAGNOSIS — R197 Diarrhea, unspecified: Secondary | ICD-10-CM | POA: Diagnosis not present

## 2024-01-03 DIAGNOSIS — T3695XA Adverse effect of unspecified systemic antibiotic, initial encounter: Secondary | ICD-10-CM

## 2024-01-03 DIAGNOSIS — G43009 Migraine without aura, not intractable, without status migrainosus: Secondary | ICD-10-CM | POA: Diagnosis not present

## 2024-01-03 DIAGNOSIS — K521 Toxic gastroenteritis and colitis: Secondary | ICD-10-CM

## 2024-01-03 MED ORDER — PREGABALIN 50 MG PO CAPS: 50.0000 mg | ORAL_CAPSULE | Freq: Three times a day (TID) | ORAL | 4 refills | Status: DC

## 2024-01-03 MED ORDER — HYOSCYAMINE SULFATE ER 0.375 MG PO TB12: 0.3750 mg | ORAL_TABLET | Freq: Two times a day (BID) | ORAL | 0 refills | Status: DC

## 2024-01-03 MED ORDER — NURTEC 75 MG PO TBDP: ORAL_TABLET | ORAL | 4 refills | Status: AC

## 2024-01-03 MED ORDER — PROMETHAZINE HCL 12.5 MG PO TABS: 12.5000 mg | ORAL_TABLET | Freq: Four times a day (QID) | ORAL | 0 refills | Status: AC | PRN

## 2024-01-03 NOTE — Telephone Encounter (Signed)
 I have received a request via CMM to do a Prior Authorization for Nurtec 75mg . Howvever this Rx will require an Appeal per the pts plan due to it being previously denied for ''Not trying Ubrelvy  first'' from denial on 11/17/23.  The pt has since tried ''Ubrelvy '' which caused-severe burning in her chest'' So that medication has been D/C'd. At this time an Appeal is needed for ''Nurtec''.  We will Update you with a determination once received.

## 2024-01-03 NOTE — Progress Notes (Signed)
 Chief Complaint  Patient presents with   Follow-up   Hospitalization Follow-up    Hospital Follow up    Subjective: Patient is a 78 y.o. female here for ER fu.  Over the past week, has had subj fevers and umbilical pain that is sharp/stabbing. No redness, vomiting, bruising, constipation, bleeding. Looser stools than usual, nausea. Nothing particularly helpful. CT abd/pelvis largely unremarkable from 11/29/23. Not much of an appetite. She has been using carafate  without relief. Bentyl  not helpful. No nighttime awakenings. She was on abx recently for a UTI. Received fosfomycin, Rocephin  and Flagyl .   Patient has a history of migraines.  She took Nurtec in the past and this worked very well for her.  When she took Ubrelvy , it caused severe burning in her chest region.  She does not wish to take this anymore.  Past Medical History:  Diagnosis Date   Anemia    Arachnoiditis    Bipolar disorder (HCC)    Cataract    removed   Chronic post-thoracotomy pain    CKD (chronic kidney disease)    GERD (gastroesophageal reflux disease)    Hypogammaglobulinemia (HCC)    Hypotension    Hypothyroid    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HCC)    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HCC)    Low blood pressure    Lung cancer (HCC) 2011, 2014   Memory loss    Migraine    Opioid abuse (HCC)    Osteoporosis    PMR (polymyalgia rheumatica) (HCC) 03/17/2022   Presence of neurostimulator    Restless legs    Sleep apnea     Objective: BP 112/72 (BP Location: Left Arm, Patient Position: Sitting)   Pulse 87   Temp 98 F (36.7 C) (Oral)   Resp 16   Ht 5\' 4"  (1.626 m)   Wt 126 lb (57.2 kg)   SpO2 97%   BMI 21.63 kg/m  General: Awake, appears stated age Mouth: MMM Abd: BS+, S, ND, ttp in epigastric and umbilical region Heart: RRR, no LE edema Lungs: CTAB, no rales, wheezes or rhonchi. No accessory muscle use Psych: Age appropriate judgment and insight  Assessment and Plan: Antibiotic-associated  diarrhea - Plan: GI Profile, Stool, PCR  Migraine without aura and without status migrainosus, not intractable  Check GI profile.  Levsin and Phenergan  sent in.  She has tolerated the latter before without issues.  Stop Bentyl .  Okay to use Carafate  as needed.  I do suspect possible C. difficile given the tempo of antibiotic usage and her symptoms.  Stay hydrated.  Consider replacement with electrolyte rich fluid rather than simply water. Chronic, not controlled, adverse effect of chronic medication.  Stop Ubrelvy , will try Nurtec again as she was having significant esophagitis with the Ubrelvy . The patient voiced understanding and agreement to the plan.  Shellie Dials [place], DO 01/03/24  11:52 AM

## 2024-01-04 ENCOUNTER — Telehealth: Payer: Self-pay | Admitting: Pharmacist

## 2024-01-04 NOTE — Telephone Encounter (Signed)
 Appeal has been submitted for Nurtec. Will advise when response is received, please be advised that most companies may take 30 days to make a decision. Appeal letter and supporting documentation have been faxed to [phone] on 01/04/2024 @9 :03am.  Thank you, Dene Fines, PharmD Clinical Pharmacist  [place]  Direct Dial: [phone]

## 2024-01-05 ENCOUNTER — Telehealth: Payer: Self-pay | Admitting: Family Medicine

## 2024-01-05 ENCOUNTER — Other Ambulatory Visit

## 2024-01-05 DIAGNOSIS — R197 Diarrhea, unspecified: Secondary | ICD-10-CM

## 2024-01-05 NOTE — Telephone Encounter (Signed)
 Called letting them know I no longer needed that sample it was found

## 2024-01-07 ENCOUNTER — Ambulatory Visit: Payer: Self-pay | Admitting: Family Medicine

## 2024-01-07 LAB — GI PROFILE, STOOL, PCR

## 2024-01-07 NOTE — Telephone Encounter (Signed)
 The insurance has approved the appeal for NURTEC ODT:    Thank you, Dene Fines, PharmD Clinical Pharmacist  [place]  Direct Dial: [phone]

## 2024-01-07 NOTE — Telephone Encounter (Signed)
 Mychart message sent to pt letting know medication was approved. Appeal has been submitted for Nurtec

## 2024-01-11 ENCOUNTER — Other Ambulatory Visit: Payer: Self-pay | Admitting: Family Medicine

## 2024-01-16 DIAGNOSIS — 419620001 Death: Secondary | SNOMED CT | POA: Diagnosis not present

## 2024-01-16 DEATH — deceased

## 2024-01-18 ENCOUNTER — Encounter: Attending: Physical Medicine and Rehabilitation | Admitting: Physical Medicine and Rehabilitation

## 2024-01-18 ENCOUNTER — Other Ambulatory Visit: Payer: Self-pay

## 2024-01-18 ENCOUNTER — Ambulatory Visit (INDEPENDENT_AMBULATORY_CARE_PROVIDER_SITE_OTHER): Admitting: Infectious Diseases

## 2024-01-18 ENCOUNTER — Encounter: Payer: Self-pay | Admitting: Physical Medicine and Rehabilitation

## 2024-01-18 ENCOUNTER — Encounter: Payer: Self-pay | Admitting: Infectious Diseases

## 2024-01-18 VITALS — BP 97/52 | HR 90 | Ht 64.0 in

## 2024-01-18 VITALS — BP 87/55 | HR 85 | Temp 97.7°F

## 2024-01-18 DIAGNOSIS — M353 Polymyalgia rheumatica: Secondary | ICD-10-CM | POA: Diagnosis present

## 2024-01-18 DIAGNOSIS — Z79899 Other long term (current) drug therapy: Secondary | ICD-10-CM

## 2024-01-18 DIAGNOSIS — N390 Urinary tract infection, site not specified: Secondary | ICD-10-CM | POA: Insufficient documentation

## 2024-01-18 DIAGNOSIS — R8279 Other abnormal findings on microbiological examination of urine: Secondary | ICD-10-CM

## 2024-01-18 DIAGNOSIS — M810 Age-related osteoporosis without current pathological fracture: Secondary | ICD-10-CM

## 2024-01-18 DIAGNOSIS — Z87440 Personal history of urinary (tract) infections: Secondary | ICD-10-CM | POA: Diagnosis present

## 2024-01-18 MED ORDER — LIDOCAINE HCL 1 % IJ SOLN: 3.0000 mL | Freq: Once | INTRAMUSCULAR | Status: DC

## 2024-01-18 MED ORDER — JOURNAVX 50 MG PO TABS: 1.0000 | ORAL_TABLET | Freq: Two times a day (BID) | ORAL | 0 refills | Status: DC | PRN

## 2024-01-18 NOTE — Progress Notes (Signed)
 Subjective:    Patient ID: Megan Brewer, female    DOB: 1945/12/15, 78 y.o.   MRN: [id number]  HPI 1) Diffuse pain, has been attributed to polymyalgia rheumatica Megan Brewer is a 78 year old woman who presents to establish care for diffuse pain.  -cold makes her pain worse -she has never tried Journavx -steroid injection in shoulder from sports medicine really helped -it is present in her wrists, clavicles, left hip -the prednisone  helps to control the pain but it only lasts 6 weeks and by the times she could go for the second infusion it was bad.  -she will be seeing her rheumatologist in a few weeks and would be very grateful if he were able to increased the frequency to q6 weeks -has been pretty good  -The Iv prednisone  helped a great deal for some time.  -she has f/u with rheumatology -they have tried high doses of prednisone , low does hydrocodone , low doses Tramadol , lidocaine  patches. Nothing has been helpful yet -she thinks the pain started after she spent 1 month at [facility] for sepsis and C diff. She left the hospital on June 1st. On June 4th she started with breathing difficulties, shaking, and some pain. It gradually got worse.  -her husband accompanies her today -steroids did not help leg pain -interested in cervical traction device -she asked her rheumatologist if she can gradually reduce the amount of steroid- they are talking about the IV steroids, she does not take any oral steroids -discussed that changes in weather exacerbate her pain  -she asks about results of food sensitivity testing  2) Shortness of breath -breathing without pain for the first time! -had been present July 2022 to July 2023 -she also feels a deflated rubber tire on her chest -she has been told her heart and lungs are fine -the steroids may have helped her to breath better -at the hospital where she was diagnosed she got a massive IV dose of steroids and this cleared up her lungs  3)  Muscle spasms: -she has tried flexeril  in the past with benefit -she recently tried robaxin  and this caused severe symptomatic hypotension requiring her to go to the ED  4) Hypotension: -fluids help -occurs suddenly -she is taking the midodrine  and florinef  -she takes midodrine  at 7am/7pm  5) Fluid retention: -she is on the flomax  for this   6) Movement disorder -was sent to neurologist at [facility]  7) family stress -husband has been limiting in walking due to need for removal of spinal mass -she has a visiting angel who has been helpful to her in the interim  8) Fatigue: -would like like to try a stimulant  9) Osteoporosis: -she asks to see a specialist for this condition    Pain Inventory Average Pain  5 Pain Right Now 8 My pain is intermittent, constant, sharp, burning, stabbing, and aching  In the last 24 hours, has pain interfered with the following? General activity 10 Relation with others 10 Enjoyment of life 10 What TIME of day is your pain at its worst? morning , daytime, evening, and night Sleep (in general) Poor  Pain is worse with: unsure Pain improves with: rest Relief from Meds: little    New pt    Family History  Problem Relation Age of Onset   Hypertension Mother    GER disease Mother    Dementia Mother    Osteoporosis Mother    Arthritis Mother    Bipolar disorder Mother  Parkinson's disease Father    Congestive Heart Failure Father    Pneumonia Father    Arthritis Father    Dementia Father    Depression Father    Asthma Sister    Depression Sister    Bipolar disorder Brother    Asthma Daughter    Colon cancer Neg Hx    Esophageal cancer Neg Hx    Stomach cancer Neg Hx    Rectal cancer Neg Hx    Social History   Socioeconomic History   Marital status: Married    Spouse name: Not on file   Number of children: 2   Years of education: Not on file   Highest education level: GED or equivalent  Occupational History    Occupation: retired  Tobacco Use   Smoking status: Never   Smokeless tobacco: Never  Vaping Use   Vaping status: Never Used  Substance and Sexual Activity   Alcohol use: Not Currently   Drug use: Never   Sexual activity: Not on file  Other Topics Concern   Not on file  Social History Narrative   Diet: None      Caffeine : coffee, tea, sodas less than or 1 daily.      Married, if yes what year: Yes, 1972      Do you live in a house, apartment, assisted living, condo, trailer, ect: Two story house       Pets: None      Current/Past profession: Teach      Exercise: None         Living Will: No   DNR: No   POA/HPOA: No      Functional Status:   Do you have difficulty bathing or dressing yourself? yes   Do you have difficulty preparing food or eating? yes   Do you have difficulty managing your medications? yes   Do you have difficulty managing your finances?yes   Do you have difficulty affording your medications? No      Right Handed    Lives in a two story home    Social Drivers of Health   Financial Resource Strain: Low Risk  (12/16/2022)   Received from [facility], [facility]   Overall Financial Resource Strain (CARDIA)    Difficulty of Paying Living Expenses: Not hard at all  Food Insecurity: No Food Insecurity (12/03/2023)   Hunger Vital Sign    Worried About Running Out of Food in the Last Year: Never true    Ran Out of Food in the Last Year: Never true  Transportation Needs: No Transportation Needs (12/03/2023)   PRAPARE - Administrator, Civil Service (Medical): No    Lack of Transportation (Non-Medical): No  Physical Activity: Inactive (03/19/2023)   Exercise Vital Sign    Days of Exercise per Week: 0 days    Minutes of Exercise per Session: 0 min  Stress: No Stress Concern Present (03/19/2023)   [employer] of Occupational Health - Occupational Stress Questionnaire    Feeling of Stress : Not at all   Social Connections: Moderately Isolated (11/30/2023)   Social Connection and Isolation Panel [NHANES]    Frequency of Communication with Friends and Family: More than three times a week    Frequency of Social Gatherings with Friends and Family: Once a week    Attends Religious Services: Never    Database administrator or Organizations: No    Attends Banker Meetings: Never  Marital Status: Married   Past Surgical History:  Procedure Laterality Date   ABDOMINAL HYSTERECTOMY     CARPAL TUNNEL RELEASE     CATARACT EXTRACTION Bilateral 2019   CHOLECYSTECTOMY     COLONOSCOPY  2015   [facility]   CT LUNG SCREENING  2018   DENTAL SURGERY  06/17/2021   4 teeth rmoved   DG  BONE DENSITY ([facility] HX)  2018   DIAGNOSTIC MAMMOGRAM  2019   ESOPHAGOGASTRODUODENOSCOPY (EGD) WITH PROPOFOL  N/A 09/23/2023   Procedure: ESOPHAGOGASTRODUODENOSCOPY (EGD) WITH PROPOFOL ;  Surgeon: Ace Holder, MD;  Location: [facility];  Service: Gastroenterology;  Laterality: N/A;   GASTRIC BYPASS     LUNG CANCER SURGERY  02/2010   MULTIPLE TOOTH EXTRACTIONS     PORT A CATH REVISION Right 04/2021   PORT-A-CATH REMOVAL Left 02/07/2021   Procedure: REMOVAL PORT-A-CATH;  Surgeon: Kinsinger, Alphonso Aschoff, MD;  Location: [facility];  Service: General;  Laterality: Left;  60 MINUTES ROOM 4   SAVORY DILATION N/A 09/23/2023   Procedure: SAVORY DILATION;  Surgeon: Ace Holder, MD;  Location: [facility];  Service: Gastroenterology;  Laterality: N/A;   SPINAL CORD STIMULATOR IMPLANT     Past Medical History:  Diagnosis Date   Anemia    Arachnoiditis    Bipolar disorder (HCC)    Cataract    removed   Chronic post-thoracotomy pain    CKD (chronic kidney disease)    GERD (gastroesophageal reflux disease)    Hypogammaglobulinemia (HCC)    Hypotension    Hypothyroid    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HCC)    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HCC)    Low blood pressure    Lung cancer (HCC) 2011, 2014    Memory loss    Migraine    Opioid abuse (HCC)    Osteoporosis    PMR (polymyalgia rheumatica) (HCC) 03/17/2022   Presence of neurostimulator    Restless legs    Sleep apnea    BP (!) 97/52   Pulse 90   Ht 5\' 4"  (1.626 m)   SpO2 98%   BMI 21.63 kg/m   Opioid Risk Score:   Fall Risk Score:  `1  Depression screen PHQ 2/9     01/18/2024    1:13 PM 12/07/2023    2:09 PM 09/16/2023    2:21 PM 08/31/2023    1:23 PM 06/01/2023    1:23 PM 03/19/2023    9:48 AM 02/22/2023    2:24 PM  Depression screen PHQ 2/9  Decreased Interest 1 1 0 3 0 0 1  Down, Depressed, Hopeless 1 1 0 3 0 0 1  PHQ - 2 Score 2 2 0 6 0 0 2      Review of Systems  Respiratory:  Positive for shortness of breath.   Gastrointestinal:  Positive for constipation and nausea.  Musculoskeletal:  Positive for gait problem.       Spasms  Neurological:  Positive for dizziness, tremors and weakness.  Psychiatric/Behavioral:  Positive for dysphoric mood. The patient is nervous/anxious.   All other systems reviewed and are negative.      Objective:   Physical Exam Gen: no distress, normal appearing HEENT: oral mucosa pink and moist, NCAT Cardio: Reg rate Chest: normal effort, normal rate of breathing Abd: soft, non-distended Ext: no edema Psych: pleasant, normal affect Skin: intact Neuro: Alert and oriented x3 MSK: in wheelchair Assessment & Plan:   1) Chronic Pain Syndrome  with diffuse pain secondary to Polymylagia rheumatica -Discussed  current symptoms of pain and history of pain.   -discussed that Journavx is a highly selective inhibitor for Nav 1.8, which is specific for pain in the peripheral nervous system, discussed that lidocaine  in contrast affects all Nav receptors, discussed loading dose is 2 50mg  tablets and this can be taken 1 hour before food, discussed that then 50mg  can be taken with out without food q12H until pain is tolerable. Discussed that potential side effects are pruritus, muscle spasms,  increase blood creatinine, rash. Discussed that we have samples available and we have copay cards available. Discussed that the 2 phase 3 clinicial trials sent to the FDA were for abdominoplasty and bunionectomy, discussed that it has been currently studied for 14 days, discussed that it can reduce efficacy of opioids and benzodiazepines, discussed that it has not be studied in severe hepatic impairment. Discussed that it is contraindicated with fluconazole . Discussed that it can interfere with hormonal contraception up to 28 days after use. Discussed that it can decrease fertility, it has not been studied in pregnancy, that it has been present in animal milk during lactation, not recommended for GFR <15, discussed that outpatient if the medication requires a prior auth the copay should be $30 for at least a 60 day supply. The medication may be more likely to be in stock in [facility] and [facility]. We do have samples available   -discussed improvement with steroid injection from sports medicine -discussed that steroids have been helpful but she is discussing with her rheumatologist about tapering these -Discussed benefits of exercise in reducing pain. -discussed results of food sensitivity testing, discussed that highest response was to cheese mold>pork>milk and recommended 3 months avoidance of these foods to see if symptoms improve -discussed that she was not breast fed as a child, she was born via a vaginal delivery -discussed that she was given a lot of antibiotics as a child.  -discussed that the only thing that has been helpful was IV steroids. Continue, recommended discussing with rheumatology increasing frequency to q6 weeks.  -discontinue cymbalta  20mg  daily.  [facility], discussed that biking will help to increase her leg muscle strength and to minimize the risk of falls.  -discussed that steroids are helping -continue ergocalciferol  50,000U  -will send  message to her care team (Dr. Gwenette Lennox PCP, Dr. Lavonne Prairie cardiologist, Dr. Jacki [place] rheumatologist) to discuss doing IV steroid treatment.  -will call her rheumatologist today to discuss Kevzara and Plaquenil.  -discussed her follow-up with rheumatology, who placed her on low dose prednisone  5mg  BID.  -Discussed following foods that may reduce pain: 1) Ginger (especially studied for arthritis)- reduce leukotriene production to decrease inflammation 2) Blueberries- high in phytonutrients that decrease inflammation 3) Salmon- marine omega-3s reduce joint swelling and pain 4) Pumpkin seeds- reduce inflammation 5) dark chocolate- reduces inflammation 6) turmeric- reduces inflammation 7) tart cherries - reduce pain and stiffness 8) extra virgin olive oil - its compound olecanthal helps to block prostaglandins  9) chili peppers- can be eaten or applied topically via capsaicin 10) mint- helpful for headache, muscle aches, joint pain, and itching 11) garlic- reduces inflammation  Link to further information on diet for chronic pain: [email or web address]    2) Shortness of breath secondary to lung cancer -discussed that it started one year ago -discussed that cardio pulmonary workup has been normal -discussed that her IV steroids were very helpful.  -ordered pulmonary rehab  3) Muscle spasms: -prescribed robaxin   4) Cervicalgia: -.Prescribed Zynex  Nexwave, cervical traction device -discussed trigger point injections   5) Movement disorder: -referred to [facility] -discussed that she cannot do MRIs.   6) Muscle spasms: -fled/c flexeril  -discussed her negative response to robaxin   7) Hypotension: -increase midodrine  to 10mg  TID, continue this dose -continue fludrocortisone  -discussed abdominal binder and teds -discussed that steroids can help -discussed that flomax  can cause this,  switch to night -encouraged adequate hydration  8) CKD: Cr monitored and Cr is stable.   9) Iron deficiency: -discussed dietary sources of iron such as red meat, green leafy vegetables, chickpeas  10) Impaired balance: -discussed her plan to start PT -discussed that she is walking more than she was, discussed that she walked to the kitchen to get water -discussed that she has a visiting angel every day- discussed within an hour of her showing up she felt like they were sisters and they have a connection with each. Discussed that she goes to the grocery store for them  11) Movement disorder: -discussed that she is beng followed by neurology   12) IBS Discussed results of food sensitivity testing, discussed that highest response was to cheese mold>pork>milk and recommended 3 months avoidance of these foods to see if symptoms improve  13) Fatigue: -ritalin  prescribed -discussed that she was told to stop taking Vitamin D , but she is not sure why -discussed that recent vitamin D  from 5/25 was 114 which is above normal range so I agree she should not take Vitamin D   14) Osteoporosis: -referred to osteoporosis clinic at [facility]  15) Recent UTI: -discussed that she is recovering from this -encouraged a lot of fruits and vegetables

## 2024-01-18 NOTE — Progress Notes (Unsigned)
 Patient Active Problem List   Diagnosis Date Noted  . Migraine without aura and without status migrainosus, not intractable 01/03/2024  . Complicated UTI (urinary tract infection) 11/29/2023  . Esophageal dysphagia 09/23/2023  . Gastroesophageal reflux disease 09/23/2023  . Orthostatic hypotension 12/22/2022  . Near syncope 12/22/2022  . Bipolar mixed affective disorder, moderate (HCC) 08/25/2022  . Tremor 08/25/2022  . Polyarthralgia 05/04/2022  . Polymyalgia rheumatica (HCC) 03/25/2022  . Thrombocytopenia, unspecified (HCC) 12/30/2021  . Upper airway cough syndrome with ? VCD  06/26/2021  . Precordial chest pain 06/05/2021  . Moderate mitral regurgitation 06/05/2021  . Essential hypertension 06/05/2021  . DOE (dyspnea on exertion) 06/05/2021  . Medication monitoring encounter 04/01/2021  . Gram-negative bacteremia 03/03/2021  . Rigors 02/04/2021  . Elevated troponin 01/14/2021  . Hypokalemia 01/14/2021  . Headache 01/14/2021  . Chest discomfort 03/22/2019  . Weight loss 01/27/2019  . Acquired hypothyroidism 01/27/2019  . Esophagitis 01/27/2019  . Intractable migraine without status migrainosus 01/27/2019  . Osteoporosis 01/27/2019  . Chronic sinusitis 12/28/2018  . Food intolerance/GI symptoms 12/28/2018  . Acute maxillary sinusitis 12/28/2018  . Lower leg edema 12/20/2018  . Chronic pain of both shoulders 05/24/2018  . Bilateral leg edema 05/24/2018  . Chronic pain syndrome 03/31/2018  . Left bundle branch block 10/28/2017  . Palpitations 10/28/2017  . History of gastric bypass 05/21/2017  . Polypharmacy 02/11/2017  . Senile nuclear sclerosis 11/26/2016  . Chronic post-thoracotomy pain 04/09/2014  . Chronic kidney disease 04/09/2014  . Sleep disturbances 09/20/2012  . Restless legs syndrome 07/07/2012  . Hypertonicity of bladder 05/23/2012  . Essential tremor 04/01/2012  . Memory loss 04/01/2012  . Incomplete emptying of bladder 01/14/2012  . Urge  incontinence 10/28/2011  . Tension type headache 06/10/2011  . Mixed urge and stress incontinence 05/28/2011  . Hypogammaglobulinemia (HCC) 06/22/2010  . Severe episode of recurrent major depressive disorder, without psychotic features (HCC) 06/22/2010  . Hypothyroidism 02/12/2010  . 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/2010  . Meningitis 01/31/2010  . OBSTRUCTIVE SLEEP APNEA 01/31/2010  . Chronic rhinitis 01/31/2010  . Arachnoiditis 01/31/2010    Patient's Medications  New Prescriptions   No medications on file  Previous Medications   CALCIUM  PO    Take 1 tablet by mouth in the morning and at bedtime.   CHLORPHENIRAMINE-PSE-IBUPROFEN  (ADVIL  ALLERGY SINUS PO)    Take 1 tablet by mouth daily.   CYCLOSPORINE  (RESTASIS ) 0.05 % OPHTHALMIC EMULSION    Place 1 drop into both eyes 4 (four) times daily as needed.   DENOSUMAB  (PROLIA ) 60 MG/ML SOSY INJECTION    Pt to get at office.  Appt is 08/27/23   DEXLANSOPRAZOLE  (DEXILANT ) 60 MG CAPSULE    Take 1 capsule (60 mg total) by mouth daily.   DICLOFENAC  SODIUM (VOLTAREN ) 1 % GEL    Apply 2 g topically 4 (four) times daily.   FAMOTIDINE  (PEPCID ) 20 MG TABLET    Take 1 tablet (20 mg total) by mouth 2 (two) times daily.   FERROUS SULFATE  325 (65 FE) MG TABLET    Take 650 mg by mouth daily with breakfast.   FLUDROCORTISONE  (FLORINEF ) 0.1 MG TABLET    TAKE ONE TABLET BY MOUTH TWICE DAILY   GAMMAGARD 20 GM/200ML SOLN    Inject 40 g into the vein every 21 ( twenty-one) days. INFUSE 40G INTRAVENOUSLY EVERY 3 WEEKS   HYOSCYAMINE  (LEVBID) 0.375 MG 12 HR TABLET    Take 1 tablet (0.375 mg total) by mouth  2 (two) times daily.   KEVZARA 200 MG/1. SOAJ    Inject 1.14 mLs into the skin every 14 (fourteen) days.   LEVOTHYROXINE  (SYNTHROID ) 75 MCG TABLET    TAKE ONE (1) TABLET BY MOUTH EACH DAY 30MINUTES BEFORE BREAKFAST ON EMPTY STOMACH   LIDOCAINE  4 %    Place 1 patch onto the skin daily.   MAGNESIUM  OXIDE (MAG-OX) 400 MG TABLET    Take 400 mg by mouth  daily.    MIDODRINE  (PROAMATINE ) 10 MG TABLET    TAKE ONE (1) TABLET BY MOUTH 3 TIMES DAILY   MYRBETRIQ  50 MG TB24 TABLET    Take 50 mg by mouth every evening.   NALOXONE  (NARCAN ) NASAL SPRAY 4 MG/0.1 ML    Spray up nostril in event of opioid overdose.   PREGABALIN  (LYRICA ) 50 MG CAPSULE    Take 1 capsule (50 mg total) by mouth 3 (three) times daily.   PROMETHAZINE  (PHENERGAN ) 12.5 MG TABLET    Take 1 tablet (12.5 mg total) by mouth every 6 (six) hours as needed for nausea or vomiting.   RIMEGEPANT SULFATE (NURTEC) 75 MG TBDP    Dissolve 1 tab under the tongue daily as needed for migraines. Repeat in 2 hours if no improvement.   SUCRALFATE  (CARAFATE ) 1 GM/10ML SUSPENSION    SMARTSIG:Milliliter(s) By Mouth   SUZETRIGINE (JOURNAVX) 50 MG TABS    Take 1 tablet by mouth 2 (two) times daily as needed.   TAMSULOSIN  (FLOMAX ) 0.4 MG CAPS CAPSULE    Take 0.4 mg by mouth daily.   TRIAMCINOLONE  CREAM (KENALOG ) 0.1 %    Apply 1 application  topically 2 (two) times daily as needed (rash).   ZINC  OXIDE, TOPICAL, 10 % CREA    Apply 1 application topically 2 (two) times daily as needed (rash).  Modified Medications   No medications on file  Discontinued Medications   No medications on file    Subjective: Discussed the use of AI scribe software for clinical note transcription with the patient, who gave verbal consent to proceed.   78 Y O Female with prior h/o lung ca s/p lobectomy, gastric bypass, rib #, hypogammaglobulinemia on immunoglobulins every 3 weeks, chronic orthostatic hypotension, h/o C diff colitis, recurrent UTI, movement d/o leading leading her to being wheel chair bound who is referred from ED after a recent ED visit on 5/12 for fall related to orthostatic hypotension.    She often does not realize she has a UTI until symptoms become severe. Recently, she collapsed at home and was seen in the emergency department, where her husband and the staff were unable to obtain a blood pressure reading.  She has autonomic dysfunction, causing low blood pressure, with home readings sometimes as low as 50/35 mmHg. She experiences a 'spacey feeling' in her head but no current dizziness or chest pain.  A specific area in her back is triggered by various factors, including UTIs, which can cause her blood pressure to drop and lead to hallucinations, although she does not remember these episodes. She experiences a 'ping' sensation when urinating, which occurs only once, and has frequent urination, especially when not taking torsemide . No current fever, chills, nausea, vomiting, or diarrhea, although she sometimes experiences diarrhea.  She reports following Urologist Dr Dulcy Gibney ansd was treated with 3 doses of fosfomycin before the ED visit. She denies any active GU symptoms like burning, urgency, suprapubic pain, flankpain or back pain. Denies fevers, chills. Denies nausea, vomiting or diarrhea. She has too urinate  frequently when she is on a diuretic she reports   Review of Systems: ROS  Past Medical History:  Diagnosis Date  . Anemia   . Arachnoiditis   . Bipolar disorder (HCC)   . Cataract    removed  . Chronic post-thoracotomy pain   . CKD (chronic kidney disease)   . GERD (gastroesophageal reflux disease)   . Hypogammaglobulinemia (HCC)   . Hypotension   . Hypothyroid   . 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HCC)   . 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 Low blood pressure   . Lung cancer (HCC) 2011, 2014  . Memory loss   . Migraine   . Opioid abuse (HCC)   . Osteoporosis   . PMR (polymyalgia rheumatica) (HCC) 03/17/2022  . Presence of neurostimulator   . Restless legs   . Sleep apnea    Past Surgical History:  Procedure Laterality Date  . ABDOMINAL HYSTERECTOMY    . CARPAL TUNNEL RELEASE    . CATARACT EXTRACTION Bilateral 2019  . CHOLECYSTECTOMY    . COLONOSCOPY  2015   [facility]  . CT LUNG SCREENING  2018  . DENTAL SURGERY  06/17/2021   4 teeth rmoved  . DG  BONE DENSITY ([facility]  HX)  2018  . DIAGNOSTIC MAMMOGRAM  2019  . ESOPHAGOGASTRODUODENOSCOPY (EGD) WITH PROPOFOL  N/A 09/23/2023   Procedure: ESOPHAGOGASTRODUODENOSCOPY (EGD) WITH PROPOFOL ;  Surgeon: Ace Holder, MD;  Location: [facility];  Service: Gastroenterology;  Laterality: N/A;  . GASTRIC BYPASS    . LUNG CANCER SURGERY  02/2010  . MULTIPLE TOOTH EXTRACTIONS    . PORT A CATH REVISION Right 04/2021  . PORT-A-CATH REMOVAL Left 02/07/2021   Procedure: REMOVAL PORT-A-CATH;  Surgeon: Kinsinger, Alphonso Aschoff, MD;  Location: [facility];  Service: General;  Laterality: Left;  60 MINUTES ROOM 4  . SAVORY DILATION N/A 09/23/2023   Procedure: SAVORY DILATION;  Surgeon: Ace Holder, MD;  Location: [facility];  Service: Gastroenterology;  Laterality: N/A;  . SPINAL CORD STIMULATOR IMPLANT       Social History   Tobacco Use  . Smoking status: Never  . Smokeless tobacco: Never  Vaping Use  . Vaping status: Never Used  Substance Use Topics  . Alcohol use: Not Currently  . Drug use: Never    Family History  Problem Relation Age of Onset  . Hypertension Mother   . GER disease Mother   . Dementia Mother   . Osteoporosis Mother   . Arthritis Mother   . Bipolar disorder Mother   . Parkinson's disease Father   . Congestive Heart Failure Father   . Pneumonia Father   . Arthritis Father   . Dementia Father   . Depression Father   . Asthma Sister   . Depression Sister   . Bipolar disorder Brother   . Asthma Daughter   . Colon cancer Neg Hx   . Esophageal cancer Neg Hx   . Stomach cancer Neg Hx   . Rectal cancer Neg Hx     Allergies  Allergen Reactions  . Butorphanol Hives, Other (See Comments) and Swelling    Cerebral pain, "like someone is brushing my brain with a brillo pad"  Other Reaction(s): Not available  . Erythromycin Base Diarrhea, Itching and Other (See Comments)    Severe diarrhea  . Propoxyphene Nausea Only and Other (See Comments)    Depression, Agitation, Blurred  Vision  Other Reaction(s): Not available  . Sumatriptan  Nausea And Vomiting, Other (See Comments) and Tinitus  Ringing in the ears, migraines are worse  Other Reaction(s): Not available  . Tetracycline Nausea Only, Other (See Comments) and Tinitus    Causes ringing in ears, dizziness, migraine, nausea  Other Reaction(s): Not available  . Ciprofloxacin Hives, Other (See Comments) and Tinitus    Headache, dizziness, ringing in the ears  Other Reaction(s): Not available  . Corticosteroids Other (See Comments)    12/02/2016 interview by Ritta Chessman: Oral dosing causes migraines/nausea/tinnitus. Prev tolerated IV and intranasal admin w/o difficulty.  . Doxycycline  Other (See Comments)    unknown  Other Reaction(s): Not available  . Cefixime Other (See Comments)    Headache  Other Reaction(s): Not available  . Isometheptene-Dichloral-Apap Other (See Comments)    Unknown, mood liability  . Ketorolac      12/02/2016 interview by Ritta Chessman: Oral dosing prednisone /corticosteroids causes migraines/nausea/tinnitus. Prev tolerated IV and intranasal admin w/o difficulty.  Other Reaction(s): Not available  . Prednisone  Other (See Comments)    Migraine, dizzy, ringing in ears-can take it IV  12/02/2016 interview by [name]: Oral prednisone  dosing causes migraines/nausea/tinnitus. Prev tolerated IV and intranasal admin w/o difficulty.  Other Reaction(s): Not available  . Promethazine  Other (See Comments)    causes severe abdominal pain when taken IV; however she can take PO or IM  Other Reaction(s): Not available  . Tape Other (See Comments)    Adhesive tapes-patient denies    Health Maintenance  Topic Date Due  . Medicare Annual Wellness (AWV)  03/18/2024  . COVID-19 Vaccine (11 - Pfizer risk 2024-25 season) 06/23/2024 (Originally 11/30/2023)  . INFLUENZA VACCINE  03/17/2024  . Colonoscopy  01/01/2027  . DTaP/Tdap/Td (3 - Td or Tdap) 05/30/2030  . Pneumonia Vaccine 34+ Years old  Completed  . DEXA  SCAN  Completed  . Hepatitis C Screening  Completed  . Zoster Vaccines- Shingrix   Completed  . HPV VACCINES  Aged Out  . Meningococcal B Vaccine  Aged Out    Objective: BP (!) 87/55   Pulse 85   Temp 97.7 F (36.5 C) (Temporal)   SpO2 99%    Physical Exam Constitutional:      Appearance: Normal appearance.  HENT:     Head: Normocephalic and atraumatic.      Mouth: Mucous membranes are moist.  Eyes:    Conjunctiva/sclera: Conjunctivae normal.     Pupils: Pupils are equal, round, and b/l symmetrical    Cardiovascular:     Rate and Rhythm: Normal rate and regular rhythm.     Heart sounds: s1s2  Pulmonary:     Effort: Pulmonary effort is normal.     Breath sounds: Normal breath sounds.   Abdominal:     General: Non distended     Palpations: soft, non tender   Musculoskeletal:        General: sitting in the wheel chair   Skin:    General: Skin is warm and dry.     Comments:  Neurological:     General:     Mental Status: awake, alert and oriented to person, place, and time.   Psychiatric:        Mood and Affect: Mood normal.   Lab Results Lab Results  Component Value Date   WBC 7.0 12/27/2023   HGB 13.5 12/27/2023   HCT 41.5 12/27/2023   MCV 92.6 12/27/2023   PLT 167 12/27/2023    Lab Results  Component Value Date   CREATININE 1.29 (H) 12/27/2023   BUN 28 (H) 12/27/2023   NA 140 12/27/2023  K 4.5 12/27/2023   CL 105 12/27/2023   CO2 25 12/27/2023    Lab Results  Component Value Date   ALT 72 (H) 12/27/2023   AST 58 (H) 12/27/2023   ALKPHOS 76 12/27/2023   BILITOT <0.2 12/27/2023    Lab Results  Component Value Date   CHOL 135 11/30/2023   HDL 55 11/30/2023   LDLCALC 59 11/30/2023   TRIG 104 11/30/2023   CHOLHDL 2.5 11/30/2023   No results found for: "LABRPR", "RPRTITER" No results found for: "HIV1RNAQUANT", "HIV1RNAVL", "CD4TABS"   Microbiology Results for orders placed or performed during the hospital encounter of 12/27/23  Blood  Culture (routine x 2)     Status: None   Collection Time: 12/27/23 11:29 AM   Specimen: BLOOD  Result Value Ref Range Status   Specimen Description   Final    BLOOD PORTA CATH Performed at [facility], [address], [place], [place] [address]    Special Requests   Final    BOTTLES DRAWN AEROBIC AND ANAEROBIC Blood Culture adequate volume Performed at [facility], [address], [place], [place] [address]    Culture   Final    NO GROWTH 5 DAYS Performed at [facility], [address], [place], [place] [address]    Report Status 01/01/2024 FINAL  Final  Urine Culture     Status: Abnormal   Collection Time: 12/27/23  2:45 PM   Specimen: Urine, Clean Catch  Result Value Ref Range Status   Specimen Description   Final    URINE, CLEAN CATCH Performed at [facility], [address] [place], [place] [address]    Special Requests   Final    NONE Performed at [facility], [address], [place], [place] [address]    Culture >=100,000 COLONIES/mL KLEBSIELLA OXYTOCA (A)  Final   Report Status 12/29/2023 FINAL  Final   Organism ID, Bacteria KLEBSIELLA OXYTOCA (A)  Final      Susceptibility   Klebsiella oxytoca - MIC*    AMPICILLIN >=32 RESISTANT Resistant     CEFEPIME  <=0.12 SENSITIVE Sensitive     CEFTRIAXONE  <=0.25 SENSITIVE Sensitive     CIPROFLOXACIN <=0.25 SENSITIVE Sensitive     GENTAMICIN <=1 SENSITIVE Sensitive     IMIPENEM <=0.25 SENSITIVE Sensitive     NITROFURANTOIN  <=16 SENSITIVE Sensitive     TRIMETH /SULFA  <=20 SENSITIVE Sensitive     AMPICILLIN/SULBACTAM 8 SENSITIVE Sensitive     PIP/TAZO <=4 SENSITIVE Sensitive ug/mL    * >=100,000 COLONIES/mL KLEBSIELLA OXYTOCA  Resp panel by RT-PCR (RSV, Flu A&B, Covid) Anterior Nasal Swab     Status: None   Collection Time: 12/27/23  3:06 PM   Specimen: Anterior Nasal Swab  Result Value Ref Range Status   SARS Coronavirus 2 by RT PCR  NEGATIVE NEGATIVE Final    Comment: (NOTE) SARS-CoV-2 target nucleic acids are NOT DETECTED.  The SARS-CoV-2 RNA is generally detectable in upper respiratory specimens during the acute phase of infection. The lowest concentration of SARS-CoV-2 viral copies this assay can detect is 138 copies/mL. A negative result does not preclude SARS-Cov-2 infection and should not be used as the sole basis for treatment or other patient management decisions. A negative result may occur with  improper specimen collection/handling, submission of specimen other than nasopharyngeal swab, presence of viral mutation(s) within the areas targeted by this assay, and inadequate number of viral copies(<138 copies/mL). A negative result must be combined  with clinical observations, patient history, and epidemiological information. The expected result is Negative.  Fact Sheet for Patients:  BloggerCourse.com  Fact Sheet for Healthcare Providers:  SeriousBroker.it  This test is no t yet approved or cleared by the [place]  FDA and  has been authorized for detection and/or diagnosis of SARS-CoV-2 by FDA under an Emergency Use Authorization (EUA). This EUA will remain  in effect (meaning this test can be used) for the duration of the COVID-19 declaration under Section 564(b)(1) of the Act, 21 U.S.C.section 360bbb-3(b)(1), unless the authorization is terminated  or revoked sooner.       Influenza A by PCR NEGATIVE NEGATIVE Final   Influenza B by PCR NEGATIVE NEGATIVE Final    Comment: (NOTE) The Xpert Xpress SARS-CoV-2/FLU/RSV plus assay is intended as an aid in the diagnosis of influenza from Nasopharyngeal swab specimens and should not be used as a sole basis for treatment. Nasal washings and aspirates are unacceptable for Xpert Xpress SARS-CoV-2/FLU/RSV testing.  Fact Sheet for Patients: BloggerCourse.com  Fact Sheet for  Healthcare Providers: SeriousBroker.it  This test is not yet approved or cleared by the [place]  FDA and has been authorized for detection and/or diagnosis of SARS-CoV-2 by FDA under an Emergency Use Authorization (EUA). This EUA will remain in effect (meaning this test can be used) for the duration of the COVID-19 declaration under Section 564(b)(1) of the Act, 21 U.S.C. section 360bbb-3(b)(1), unless the authorization is terminated or revoked.     Resp Syncytial Virus by PCR NEGATIVE NEGATIVE Final    Comment: (NOTE) Fact Sheet for Patients: BloggerCourse.com  Fact Sheet for Healthcare Providers: SeriousBroker.it  This test is not yet approved or cleared by the [place]  FDA and has been authorized for detection and/or diagnosis of SARS-CoV-2 by FDA under an Emergency Use Authorization (EUA). This EUA will remain in effect (meaning this test can be used) for the duration of the COVID-19 declaration under Section 564(b)(1) of the Act, 21 U.S.C. section 360bbb-3(b)(1), unless the authorization is terminated or revoked.  Performed at [facility], [address], [place], [place] [address]    Imaging DG Chest Port 1 View Result Date: 12/27/2023 CLINICAL DATA:  Questionable sepsis EXAM: PORTABLE CHEST 1 VIEW COMPARISON:  12/12/2023 FINDINGS: Remote right rib surgical or posttraumatic defect. Right Port-A-Cath tip at high right atrium. Spinal stimulators. Numerous leads and wires project over the chest. Midline trachea. Normal heart size. No pleural effusion or pneumothorax. Clear lungs. IMPRESSION: No evidence of pneumonia or explanation for sepsis. Electronically Signed   By: Lore Rode M.D.   On: 12/27/2023 12:45   Assessment/Plan # Positive Urinary cultures with Kleb oxytoca/possible colonization  - unlikely true UTI    # h/o C diff colitis - no reported diarrhea   #  Orthostatic hypotension  - BP seems to be her baseline - fu with PCP  I have personally spent 60 minutes involved in face-to-face and non-face-to-face activities for this patient on the day of the visit. Professional time spent includes the following activities: Preparing to see the patient (review of tests), Obtaining and/or reviewing separately obtained history (admission/discharge record), Performing a medically appropriate examination and/or evaluation , Ordering medications, referring and communicating with other health care professionals, Documenting clinical information in the EMR, Independently interpreting results (not separately reported), Communicating results to the patient/family/caregiver, Counseling and educating the patient/family/caregiver and Care coordination (not separately reported).   Of note, portions of this note may have been created with voice recognition software.  While this note has been edited for accuracy, occasional wrong-word or 'sound-a-like' substitutions may have occurred due to the inherent limitations of voice recognition software.   Melvina Stage, MD [facility] 01/18/2024, 1:56 PM

## 2024-01-19 DIAGNOSIS — G259 Extrapyramidal and movement disorder, unspecified: Secondary | ICD-10-CM | POA: Diagnosis not present

## 2024-01-19 DIAGNOSIS — R29898 Other symptoms and signs involving the musculoskeletal system: Secondary | ICD-10-CM | POA: Diagnosis not present

## 2024-01-19 DIAGNOSIS — R278 Other lack of coordination: Secondary | ICD-10-CM | POA: Diagnosis not present

## 2024-01-19 DIAGNOSIS — F444 Conversion disorder with motor symptom or deficit: Secondary | ICD-10-CM | POA: Diagnosis not present

## 2024-01-19 DIAGNOSIS — Z7409 Other reduced mobility: Secondary | ICD-10-CM | POA: Diagnosis not present

## 2024-01-19 DIAGNOSIS — Z789 Other specified health status: Secondary | ICD-10-CM | POA: Diagnosis not present

## 2024-01-19 DIAGNOSIS — R8279 Other abnormal findings on microbiological examination of urine: Secondary | ICD-10-CM | POA: Insufficient documentation

## 2024-01-20 ENCOUNTER — Other Ambulatory Visit: Payer: Self-pay | Admitting: Family Medicine

## 2024-01-22 ENCOUNTER — Other Ambulatory Visit: Payer: Self-pay | Admitting: Nurse Practitioner

## 2024-01-22 DIAGNOSIS — E039 Hypothyroidism, unspecified: Secondary | ICD-10-CM

## 2024-01-24 ENCOUNTER — Other Ambulatory Visit: Payer: Self-pay | Admitting: Family Medicine

## 2024-01-24 DIAGNOSIS — N3000 Acute cystitis without hematuria: Secondary | ICD-10-CM | POA: Diagnosis not present

## 2024-01-24 DIAGNOSIS — Z789 Other specified health status: Secondary | ICD-10-CM | POA: Diagnosis not present

## 2024-01-24 DIAGNOSIS — R29898 Other symptoms and signs involving the musculoskeletal system: Secondary | ICD-10-CM | POA: Diagnosis not present

## 2024-01-24 DIAGNOSIS — R3915 Urgency of urination: Secondary | ICD-10-CM | POA: Diagnosis not present

## 2024-01-24 DIAGNOSIS — G259 Extrapyramidal and movement disorder, unspecified: Secondary | ICD-10-CM | POA: Diagnosis not present

## 2024-01-24 DIAGNOSIS — Z7409 Other reduced mobility: Secondary | ICD-10-CM | POA: Diagnosis not present

## 2024-01-24 DIAGNOSIS — N3021 Other chronic cystitis with hematuria: Secondary | ICD-10-CM | POA: Diagnosis not present

## 2024-01-24 DIAGNOSIS — I959 Hypotension, unspecified: Secondary | ICD-10-CM

## 2024-01-24 DIAGNOSIS — R278 Other lack of coordination: Secondary | ICD-10-CM | POA: Diagnosis not present

## 2024-01-24 DIAGNOSIS — F444 Conversion disorder with motor symptom or deficit: Secondary | ICD-10-CM | POA: Diagnosis not present

## 2024-01-24 DIAGNOSIS — R300 Dysuria: Secondary | ICD-10-CM | POA: Diagnosis not present

## 2024-01-27 ENCOUNTER — Other Ambulatory Visit: Payer: Self-pay | Admitting: Family Medicine

## 2024-01-27 DIAGNOSIS — R278 Other lack of coordination: Secondary | ICD-10-CM | POA: Diagnosis not present

## 2024-01-27 DIAGNOSIS — F444 Conversion disorder with motor symptom or deficit: Secondary | ICD-10-CM | POA: Diagnosis not present

## 2024-01-27 DIAGNOSIS — G259 Extrapyramidal and movement disorder, unspecified: Secondary | ICD-10-CM | POA: Diagnosis not present

## 2024-01-27 DIAGNOSIS — R29898 Other symptoms and signs involving the musculoskeletal system: Secondary | ICD-10-CM | POA: Diagnosis not present

## 2024-01-27 DIAGNOSIS — Z789 Other specified health status: Secondary | ICD-10-CM | POA: Diagnosis not present

## 2024-01-27 DIAGNOSIS — Z7409 Other reduced mobility: Secondary | ICD-10-CM | POA: Diagnosis not present

## 2024-02-01 DIAGNOSIS — R278 Other lack of coordination: Secondary | ICD-10-CM | POA: Diagnosis not present

## 2024-02-01 DIAGNOSIS — G259 Extrapyramidal and movement disorder, unspecified: Secondary | ICD-10-CM | POA: Diagnosis not present

## 2024-02-01 DIAGNOSIS — Z7409 Other reduced mobility: Secondary | ICD-10-CM | POA: Diagnosis not present

## 2024-02-01 DIAGNOSIS — R29898 Other symptoms and signs involving the musculoskeletal system: Secondary | ICD-10-CM | POA: Diagnosis not present

## 2024-02-01 DIAGNOSIS — Z789 Other specified health status: Secondary | ICD-10-CM | POA: Diagnosis not present

## 2024-02-01 DIAGNOSIS — F444 Conversion disorder with motor symptom or deficit: Secondary | ICD-10-CM | POA: Diagnosis not present

## 2024-02-02 ENCOUNTER — Other Ambulatory Visit: Payer: Self-pay

## 2024-02-03 ENCOUNTER — Ambulatory Visit

## 2024-02-03 VITALS — BP 93/51 | HR 70 | Temp 98.0°F | Ht 64.0 in | Wt 126.0 lb

## 2024-02-03 DIAGNOSIS — G259 Extrapyramidal and movement disorder, unspecified: Secondary | ICD-10-CM | POA: Diagnosis not present

## 2024-02-03 DIAGNOSIS — F444 Conversion disorder with motor symptom or deficit: Secondary | ICD-10-CM | POA: Diagnosis not present

## 2024-02-03 DIAGNOSIS — M353 Polymyalgia rheumatica: Secondary | ICD-10-CM

## 2024-02-03 DIAGNOSIS — Z7409 Other reduced mobility: Secondary | ICD-10-CM | POA: Diagnosis not present

## 2024-02-03 DIAGNOSIS — M2550 Pain in unspecified joint: Secondary | ICD-10-CM

## 2024-02-03 DIAGNOSIS — Z789 Other specified health status: Secondary | ICD-10-CM | POA: Diagnosis not present

## 2024-02-03 DIAGNOSIS — R278 Other lack of coordination: Secondary | ICD-10-CM | POA: Diagnosis not present

## 2024-02-03 DIAGNOSIS — R29898 Other symptoms and signs involving the musculoskeletal system: Secondary | ICD-10-CM | POA: Diagnosis not present

## 2024-02-03 MED ORDER — HEPARIN SOD (PORK) LOCK FLUSH 100 UNIT/ML IV SOLN: 250.0000 [IU] | Freq: Once | INTRAVENOUS | Status: DC | PRN

## 2024-02-03 MED ORDER — SODIUM CHLORIDE 0.9 % IV SOLN
1000.0000 mg | Freq: Once | INTRAVENOUS | Status: AC
  Administered 2024-02-03: 1000 mg via INTRAVENOUS
  Filled 2024-02-03: qty 16

## 2024-02-03 MED ORDER — HEPARIN SOD (PORK) LOCK FLUSH 100 UNIT/ML IV SOLN
500.0000 [IU] | Freq: Once | INTRAVENOUS | Status: AC | PRN
  Administered 2024-02-03: 500 [IU]
  Filled 2024-02-03: qty 5

## 2024-02-03 NOTE — Progress Notes (Signed)
 Diagnosis: Polymyalgia Rheumatica  Provider:  Phyllis Breeze MD  Procedure: IV Infusion  IV Type: Port a Cath, IV Location: R Chest  Solumedrol (Methylprednisolone ), Dose: 1000 mg  Infusion Start Time: 1434  Infusion Stop Time: 1537  Post Infusion IV Care: Port a Cath Deaccessed/Flushed  Discharge: Condition: Good, Destination: Home . AVS Declined  Performed by:  Adelle Zachar, RN

## 2024-02-05 ENCOUNTER — Other Ambulatory Visit: Payer: Self-pay | Admitting: Family Medicine

## 2024-02-05 DIAGNOSIS — R531 Weakness: Secondary | ICD-10-CM

## 2024-02-07 ENCOUNTER — Telehealth: Payer: Self-pay

## 2024-02-07 ENCOUNTER — Encounter (HOSPITAL_COMMUNITY): Payer: Self-pay

## 2024-02-07 ENCOUNTER — Emergency Department (HOSPITAL_COMMUNITY): Admission: EM | Admit: 2024-02-07 | Discharge: 2024-02-07 | Disposition: A | Discharge status: Home / Self Care

## 2024-02-07 ENCOUNTER — Emergency Department (HOSPITAL_COMMUNITY)

## 2024-02-07 ENCOUNTER — Emergency Department (HOSPITAL_COMMUNITY): Admission: EM | Admit: 2024-02-07 | Discharge: 2024-02-08 | Disposition: A | Discharge status: Home / Self Care

## 2024-02-07 ENCOUNTER — Other Ambulatory Visit (HOSPITAL_COMMUNITY): Payer: Self-pay

## 2024-02-07 ENCOUNTER — Other Ambulatory Visit: Payer: Self-pay

## 2024-02-07 DIAGNOSIS — R5381 Other malaise: Secondary | ICD-10-CM | POA: Insufficient documentation

## 2024-02-07 DIAGNOSIS — R1032 Left lower quadrant pain: Secondary | ICD-10-CM | POA: Insufficient documentation

## 2024-02-07 DIAGNOSIS — R531 Weakness: Secondary | ICD-10-CM | POA: Insufficient documentation

## 2024-02-07 DIAGNOSIS — E860 Dehydration: Secondary | ICD-10-CM | POA: Diagnosis not present

## 2024-02-07 DIAGNOSIS — N189 Chronic kidney disease, unspecified: Secondary | ICD-10-CM | POA: Insufficient documentation

## 2024-02-07 DIAGNOSIS — R110 Nausea: Secondary | ICD-10-CM | POA: Diagnosis not present

## 2024-02-07 DIAGNOSIS — R5383 Other fatigue: Secondary | ICD-10-CM | POA: Insufficient documentation

## 2024-02-07 DIAGNOSIS — R7989 Other specified abnormal findings of blood chemistry: Secondary | ICD-10-CM | POA: Insufficient documentation

## 2024-02-07 DIAGNOSIS — Z85118 Personal history of other malignant neoplasm of bronchus and lung: Secondary | ICD-10-CM | POA: Insufficient documentation

## 2024-02-07 DIAGNOSIS — R197 Diarrhea, unspecified: Secondary | ICD-10-CM | POA: Diagnosis not present

## 2024-02-07 DIAGNOSIS — R1012 Left upper quadrant pain: Secondary | ICD-10-CM | POA: Diagnosis not present

## 2024-02-07 DIAGNOSIS — I959 Hypotension, unspecified: Secondary | ICD-10-CM | POA: Insufficient documentation

## 2024-02-07 DIAGNOSIS — R1111 Vomiting without nausea: Secondary | ICD-10-CM | POA: Diagnosis not present

## 2024-02-07 LAB — CBC
HCT: 40.5 % (ref 36.0–46.0)
Hemoglobin: 13 g/dL (ref 12.0–15.0)
MCH: 30.2 pg (ref 26.0–34.0)
MCHC: 32.1 g/dL (ref 30.0–36.0)
MCV: 94 fL (ref 80.0–100.0)
Platelets: 126 10*3/uL — ABNORMAL LOW (ref 150–400)
RBC: 4.31 MIL/uL (ref 3.87–5.11)
RDW: 13 % (ref 11.5–15.5)
WBC: 7.6 10*3/uL (ref 4.0–10.5)
nRBC: 0 % (ref 0.0–0.2)

## 2024-02-07 LAB — HEPATIC FUNCTION PANEL
ALT: 75 U/L — ABNORMAL HIGH (ref 0–44)
AST: 57 U/L — ABNORMAL HIGH (ref 15–41)
Albumin: 2.8 g/dL — ABNORMAL LOW (ref 3.5–5.0)
Alkaline Phosphatase: 59 U/L (ref 38–126)
Bilirubin, Direct: <0.1 mg/dL (ref 0.0–0.2)
Total Bilirubin: 0.7 mg/dL (ref 0.0–1.2)
Total Protein: 6.4 g/dL — ABNORMAL LOW (ref 6.5–8.1)

## 2024-02-07 LAB — BASIC METABOLIC PANEL WITH GFR
Anion gap: 15 (ref 5–15)
BUN: 48 mg/dL — ABNORMAL HIGH (ref 8–23)
CO2: 21 mmol/L — ABNORMAL LOW (ref 22–32)
Calcium: 8.9 mg/dL (ref 8.9–10.3)
Chloride: 101 mmol/L (ref 98–111)
Creatinine, Ser: 1.74 mg/dL — ABNORMAL HIGH (ref 0.44–1.00)
GFR, Estimated: 30 mL/min — ABNORMAL LOW (ref >60)
Glucose, Bld: 100 mg/dL — ABNORMAL HIGH (ref 70–99)
Potassium: 4.1 mmol/L (ref 3.5–5.1)
Sodium: 137 mmol/L (ref 135–145)

## 2024-02-07 LAB — TROPONIN I (HIGH SENSITIVITY)
Troponin I (High Sensitivity): 81 ng/L — ABNORMAL HIGH (ref <18)
Troponin I (High Sensitivity): 319 ng/L (ref <18)
Troponin I (High Sensitivity): 472 ng/L (ref <18)

## 2024-02-07 LAB — LIPASE, BLOOD: Lipase: 76 U/L — ABNORMAL HIGH (ref 11–51)

## 2024-02-07 LAB — I-STAT CG4 LACTIC ACID, ED
Lactic Acid, Venous: 0.8 mmol/L (ref 0.5–1.9)
Lactic Acid, Venous: 0.5 mmol/L (ref 0.5–1.9)

## 2024-02-07 LAB — CK: Total CK: 90 U/L (ref 38–234)

## 2024-02-07 MED ORDER — ONDANSETRON HCL 4 MG/2ML IJ SOLN
4.0000 mg | Freq: Once | INTRAMUSCULAR | Status: AC
  Administered 2024-02-07: 4 mg via INTRAVENOUS
  Filled 2024-02-07: qty 2

## 2024-02-07 MED ORDER — HEPARIN SOD (PORK) LOCK FLUSH 100 UNIT/ML IV SOLN
INTRAVENOUS | Status: AC
  Administered 2024-02-07: 500 [IU]
  Filled 2024-02-07: qty 5

## 2024-02-07 MED ORDER — LACTATED RINGERS IV BOLUS
1000.0000 mL | Freq: Once | INTRAVENOUS | Status: AC
  Administered 2024-02-07: 1000 mL via INTRAVENOUS

## 2024-02-07 MED ORDER — MORPHINE SULFATE (PF) 2 MG/ML IV SOLN
2.0000 mg | Freq: Once | INTRAVENOUS | Status: AC
  Administered 2024-02-07: 2 mg via INTRAVENOUS
  Filled 2024-02-07: qty 1

## 2024-02-07 NOTE — ED Notes (Signed)
 Patient had an elevated troponin. Spoke to Dr  Neysa. He recommended the patient come back into the ER. Attempted to call the patient, her husband and her daughter. Left a message fro all three. Patients daughter called back and stated she would reach out to her mom. Patient just called back and stated she was on her way back to the ER.

## 2024-02-07 NOTE — ED Provider Notes (Signed)
 [place] EMERGENCY DEPARTMENT AT [facility] Provider Note   CSN: [id number] Arrival date & time: 02/07/24  1435     Patient presents with: Fatigue and Hypotension   Megan Brewer is a 78 y.o. female.   This is a 78 year old female presenting emergency department with malaise, generalized weakness.  Onset today.  Reports history of arthritis and that all of her joints were hurting today.  She went to the bathroom felt hot and sweaty, had several watery diarrhea episodes over the course of 3 hours and was unable to get up off the toilet.  Reports pain to her left upper and lower abdomen.  Nauseated.  She is near the end of a course of antibiotics for urinary tract infection.  Denies fevers chills chest pain, cough.        Prior to Admission medications   Medication Sig Start Date End Date Taking? Authorizing Provider  CALCIUM  PO Take 1 tablet by mouth in the morning and at bedtime.    [provider]  Chlorpheniramine-PSE-Ibuprofen  (ADVIL  ALLERGY SINUS PO) Take 1 tablet by mouth daily.    [provider]  cycloSPORINE  (RESTASIS ) 0.05 % ophthalmic emulsion Place 1 drop into both eyes 4 (four) times daily as needed.    [provider]  denosumab  (PROLIA ) 60 MG/ML SOSY injection Pt to get at office.  Appt is 08/27/23 07/14/23   [name], DO  dexlansoprazole  (DEXILANT ) 60 MG capsule Take 1 capsule (60 mg total) by mouth daily. 07/26/23   [name], PA-C  diclofenac  Sodium (VOLTAREN ) 1 % GEL Apply 2 g topically 4 (four) times daily. Patient taking differently: Apply 2 g topically 4 (four) times daily as needed. 07/27/23   [name], PA-C  famotidine  (PEPCID ) 20 MG tablet Take 1 tablet (20 mg total) by mouth 2 (two) times daily. 09/20/23   [name], PA-C  ferrous sulfate  325 (65 FE) MG tablet Take 650 mg by mouth daily with breakfast.    [provider]  fludrocortisone  (FLORINEF ) 0.1 MG tablet TAKE ONE  TABLET BY MOUTH TWICE DAILY 11/22/23   Wendling, Mabel Mt, DO  GAMMAGARD 20 GM/200ML SOLN Inject 40 g into the vein every 21 ( twenty-one) days. INFUSE 40G INTRAVENOUSLY EVERY 3 WEEKS 02/27/22   [provider]  hyoscyamine  (LEVBID ) 0.375 MG 12 hr tablet Take 1 tablet (0.375 mg total) by mouth 2 (two) times daily. 01/03/24   Wendling, Mabel Mt, DO  KEVZARA 200 MG/1. SOAJ Inject 1.14 mLs into the skin every 14 (fourteen) days. 09/09/22   [provider]  levothyroxine  (SYNTHROID ) 75 MCG tablet TAKE ONE (1) TABLET BY MOUTH EACH DAY BEFORE BREAKFAST ON EMPTY STOMACH 07/19/23   [name], DO  lidocaine  4 % Place 1 patch onto the skin daily. Patient taking differently: Place 1 patch onto the skin daily as needed. 07/08/23   [name], MD  magnesium  oxide (MAG-OX) 400 MG tablet Take 400 mg by mouth daily.     [provider]  midodrine  (PROAMATINE ) 10 MG tablet TAKE ONE TABLET BY MOUTH 3 TIMES DAILY 01/24/24   [name], DO  MYRBETRIQ  50 MG TB24 tablet Take 50 mg by mouth every evening. 10/07/22   [provider]  naloxone  (NARCAN ) nasal spray 4 mg/0.1 mL Spray up nostril in event of opioid overdose. 06/28/23   [name], DO  pregabalin  (LYRICA ) 50 MG capsule Take 1 capsule (50 mg total) by mouth 3 (three)  times daily. 01/03/24   [name], DO  promethazine  (PHENERGAN ) 12.5 MG tablet Take 1 tablet (12.5 mg total) by mouth every 6 (six) hours as needed for nausea or vomiting. 01/03/24   Frann, Mabel Mt, DO  Rimegepant Sulfate (NURTEC) 75 MG TBDP Dissolve 1 tab under the tongue daily as needed for migraines. Repeat in 2 hours if no improvement. 01/03/24   [name], DO  sucralfate  (CARAFATE ) 1 GM/10ML suspension SMARTSIG:Milliliter(s) By Mouth 12/13/23   [provider]  Suzetrigine  (JOURNAVX ) 50 MG TABS Take 1 tablet by mouth 2 (two) times daily as needed. 01/18/24    [name], [name] SQUIBB, MD  tamsulosin  (FLOMAX ) 0.4 MG CAPS capsule Take 0.4 mg by mouth daily. 11/25/23   [provider]  triamcinolone  cream (KENALOG ) 0.1 % Apply 1 application  topically 2 (two) times daily as needed (rash).    [provider]  ZINC  OXIDE, TOPICAL, 10 % CREA Apply 1 application topically 2 (two) times daily as needed (rash). 02/14/21   [name], MD    Allergies: Butorphanol, Erythromycin base, Propoxyphene, Sumatriptan , Tetracycline, Ciprofloxacin, Corticosteroids, Doxycycline , Cefixime, Isometheptene-dichloral-apap, Ketorolac , Prednisone , Promethazine , and Tape    Review of Systems  Updated Vital Signs BP (!) 134/51   Pulse 74   Temp 97.8 F (36.6 C)   Resp 14   SpO2 100%   Physical Exam Vitals and nursing note reviewed.  Constitutional:      Appearance: She is ill-appearing.  HENT:     Head: Normocephalic.     Nose: Nose normal.     Mouth/Throat:     Mouth: Mucous membranes are dry.   Eyes:     Conjunctiva/sclera: Conjunctivae normal.    Cardiovascular:     Rate and Rhythm: Normal rate and regular rhythm.  Pulmonary:     Effort: Pulmonary effort is normal.     Breath sounds: Normal breath sounds.  Abdominal:     General: Abdomen is flat. There is no distension.     Tenderness: There is abdominal tenderness (mild left sided). There is no guarding or rebound.   Musculoskeletal:        General: Normal range of motion.   Skin:    General: Skin is warm and dry.     Capillary Refill: Capillary refill takes less than 2 seconds.   Neurological:     General: No focal deficit present.     Mental Status: She is alert and oriented to person, place, and time.   Psychiatric:        Mood and Affect: Mood normal.        Behavior: Behavior normal.     (all labs ordered are listed, but only abnormal results are displayed) Labs Reviewed  CBC - Abnormal; Notable for the following components:      Result Value   Platelets 126 (*)     All other components within normal limits  BASIC METABOLIC PANEL WITH GFR - Abnormal; Notable for the following components:   CO2 21 (*)    Glucose, Bld 100 (*)    BUN 48 (*)    Creatinine, Ser 1.74 (*)    GFR, Estimated 30 (*)    All other components within normal limits  HEPATIC FUNCTION PANEL - Abnormal; Notable for the following components:   Total Protein 6.4 (*)    Albumin  2.8 (*)    AST 57 (*)    ALT 75 (*)    All other components within normal limits  LIPASE, BLOOD -  Abnormal; Notable for the following components:   Lipase 76 (*)    All other components within normal limits  C DIFFICILE QUICK SCREEN W PCR REFLEX    GASTROINTESTINAL PANEL BY PCR, STOOL (REPLACES STOOL CULTURE)  CULTURE, BLOOD (ROUTINE X 2)  CULTURE, BLOOD (ROUTINE X 2)  CK  I-STAT CG4 LACTIC ACID, ED  I-STAT CG4 LACTIC ACID, ED  TROPONIN I (HIGH SENSITIVITY)  TROPONIN I (HIGH SENSITIVITY)    EKG: None  Radiology: CT ABDOMEN PELVIS WO CONTRAST Result Date: 02/07/2024 CLINICAL DATA:  Left lower quadrant abdominal pain, weakness, fatigue, hypotensive EXAM: CT ABDOMEN AND PELVIS WITHOUT CONTRAST TECHNIQUE: Multidetector CT imaging of the abdomen and pelvis was performed following the standard protocol without IV contrast. RADIATION DOSE REDUCTION: This exam was performed according to the departmental dose-optimization program which includes automated exposure control, adjustment of the mA and/or kV according to patient size and/or use of iterative reconstruction technique. COMPARISON:  CT 11/29/2023 FINDINGS: Lower chest: Stable scarring in the right lower lobe. No acute abnormality. Hepatobiliary: Cholecystectomy. Unremarkable liver and biliary tree. Pancreas: Unremarkable. Spleen: Unremarkable. Adrenals/Urinary Tract: Normal adrenal glands. No urinary calculi or hydronephrosis. Unremarkable bladder. Stomach/Bowel: Normal caliber large and small bowel. Question mild bowel wall thickening of the transverse and  descending colon versus underdistention. The appendix is not visualized. Postoperative change of gastric bypass. Vascular/Lymphatic: Aortic atherosclerosis. No enlarged abdominal or pelvic lymph nodes. Reproductive: Hysterectomy.  No adnexal mass. Other: No free intraperitoneal fluid or air. Musculoskeletal: No acute fracture.  Neuro stimulators. IMPRESSION: 1. Question mild colitis of the transverse and descending colon versus underdistention 2. Aortic Atherosclerosis (ICD10-I70.0). Electronically Signed   By: Norman Gatlin M.D.   On: 02/07/2024 17:30     Procedures   Medications Ordered in the ED  lactated ringers  bolus 1,000 mL (0 mLs Intravenous Stopped 02/07/24 1859)  ondansetron  (ZOFRAN ) injection 4 mg (4 mg Intravenous Given 02/07/24 1631)  morphine  (PF) 2 MG/ML injection 2 mg (2 mg Intravenous Given 02/07/24 1903)    Clinical Course as of 02/07/24 1919  Mon Feb 07, 2024  1917 Workup today reassuring.  Minor elevation in BUN/creatinine.  Received IV fluids.  No other significant metabolic derangements.  No fever or tachycardia or leukocytosis to suggest systemic infection.  No anemia.  Hepatic function panel appears to be at baseline as well as her lipase.  Acute biliary disease unlikely.  CK not elevated.  Rhabdomyolysis unlikely.  Patient was complaining of chronic pain, was given a dose of IV morphine .  Has not had a bowel movement here.  Per chart review appears that she does take midodrine  for low blood pressure.  CT scan with colitis, but no other findings.  Patient is feeling improved.  Offered admission for further IV hydration, but declined she states that she would like to go home.  Will discharge at patient's request at this time [TY]    Clinical Course User Index [TY] Neysa Caron PARAS, DO                                 Medical Decision Making This is a 78 year old female complex past medical history to include immunoglobulin deficiency CKD, bipolar, lung cancer, anemia and  documented with low blood pressure as a problem.  Presents to the emergency department for generalized malaise and weakness with watery diarrhea onset today.  She has been on antibiotics per her report for urinary tract infection.  Will  get stool studies to evaluate for C. difficile.  Does have some tenderness to the left abdomen, but soft nonsurgical exam.  Will get CT scan given her age and complex past medical history.  Blood pressure is improved as EMS noted 78/43 on arrival.  Currently 96/55.  Given her hypotension, diaphoresis and low blood pressure will evaluate for cardiac cause.  EKG however reassuring without STEMI on my independent interpretation.  Will get troponin.  BMP to evaluate for electrolytes.  Mild hepatic function and lipase given her nausea and upper abdomen pain.  She was on the toilet sitting for 3 hours will get CK.  IV fluids ordered.  Will give Zofran  for nausea.  See ED course for further MDM/dispo.   Amount and/or Complexity of Data Reviewed Independent Historian:     Details: Family member notes that patient with weak External Data Reviewed:     Details: Not on blood thinners.  Does have significant prior surgeries for cholecystectomy, chronic pain with spinal cord stimulator, and Port-A-Cath Labs: ordered. Decision-making details documented in ED Course. Radiology: ordered and independent interpretation performed. Decision-making details documented in ED Course. ECG/medicine tests: ordered and independent interpretation performed. Decision-making details documented in ED Course.  Risk Prescription drug management. Decision regarding hospitalization. Diagnosis or treatment significantly limited by social determinants of health.      Final diagnoses:  None    ED Discharge Orders     None          Neysa Caron PARAS, DO 02/07/24 1919

## 2024-02-07 NOTE — ED Triage Notes (Addendum)
 Pt presents to ED from home c/o abnormal labs.  Pt was just discharged a few hours ago and was called and instructed to come back to the ED. Pt stated she does feel a little worse than earlier.  She also feels more nauseous and pain as well.

## 2024-02-07 NOTE — Telephone Encounter (Signed)
 Prolia  VOB initiated via [email or web address]  Next Prolia  inj DUE: 03/06/24

## 2024-02-07 NOTE — Telephone Encounter (Signed)
 Pt ready for scheduling for PROLIA  on or after : 03/06/24  Option# 1: Buy/Bill (Office supplied medication)  Out-of-pocket cost due at time of clinic visit: $357  Number of injection/visits approved: 2  Primary: [employer] Prolia  co-insurance: 20% Admin fee co-insurance: 20%  Secondary: --- Prolia  co-insurance:  Admin fee co-insurance:   Medical Benefit Details: Date Benefits were checked: 02/07/24 Deductible: NO/ Coinsurance: 20%/ Admin Fee: 20%  Prior Auth: APPROVED PA# [id number]  Expiration Date: 10/24/21-08/16/24   # of doses approved: 2 ----------------------------------------------------------------------- Option# 2- Med Obtained from pharmacy:  Pharmacy benefit: Copay $0 (Paid to pharmacy) Admin Fee: 20% approximately $25 (Pay at clinic)  Prior Auth: N/A PA# Expiration Date:   # of doses approved:   If patient wants fill through the pharmacy benefit please send prescription to: [employer], and include estimated need by date in rx notes. Pharmacy will ship medication directly to the office.  Patient NOT eligible for Prolia  Copay Card. Copay Card can make patient's cost as little as $25. Link to apply: [email or web address]  ** This summary of benefits is an estimation of the patient's out-of-pocket cost. Exact cost may very based on individual plan coverage.

## 2024-02-07 NOTE — Discharge Instructions (Signed)
 Please try to increase your fluid intake as you are kidney numbers are slightly elevated which would indicate hydration.  Please follow-up with your primary doctor as soon as possible for recheck of your numbers.  Return if develop fevers, chills, lightheadedness, passout, chest pain, shortness of breath, uncontrolled nausea vomiting, stop having bowel movements severe abdominal pain or you stop urinating.  You may also return if develop any new or worsening symptoms that are concerning to you.

## 2024-02-07 NOTE — Telephone Encounter (Signed)
   [employer] can no longer give estimates/quotes on non-chemotherapy drugs. Provider must follow the Fee schedule or use CMS website.   Following Medicare Advantage guideline: 20% coinsurance, 20% admin fee

## 2024-02-07 NOTE — ED Triage Notes (Signed)
 Pt BIB EMS from Home due to weakness, fatigue x 1 day. BP hypotensive with husband at home 78/43. PTAR BP 110/60, HR 82, SpO2 92%. PTAR report pt lips turned blue when trying to transport. Pt reports severe diarrhea and generalized pain, on toilet for 3 hours earlier today. AAOx4

## 2024-02-08 NOTE — ED Provider Notes (Signed)
 [place] EMERGENCY DEPARTMENT AT [facility] Provider Note   CSN: [id number] Arrival date & time: 02/07/24  2109     Patient presents with: Abnormal Lab   Terrea Bruster Dorough is a 78 y.o. female.   Presenting emergency department for elevated troponin.  Seen by myself earlier, and was discharged prior to troponins resulting in her request.  Troponin did come back elevated.  Patient remains asymptomatic without chest pain shortness of breath lightheadedness dizziness.   Abnormal Lab      Prior to Admission medications   Medication Sig Start Date End Date Taking? Authorizing Provider  CALCIUM  PO Take 1 tablet by mouth in the morning and at bedtime.    [provider]  Chlorpheniramine-PSE-Ibuprofen  (ADVIL  ALLERGY SINUS PO) Take 1 tablet by mouth daily.    [provider]  cycloSPORINE  (RESTASIS ) 0.05 % ophthalmic emulsion Place 1 drop into both eyes 4 (four) times daily as needed.    [provider]  denosumab  (PROLIA ) 60 MG/ML SOSY injection Pt to get at office.  Appt is 08/27/23 07/14/23   [name], DO  dexlansoprazole  (DEXILANT ) 60 MG capsule Take 1 capsule (60 mg total) by mouth daily. 07/26/23   [name], PA-C  diclofenac  Sodium (VOLTAREN ) 1 % GEL Apply 2 g topically 4 (four) times daily. Patient taking differently: Apply 2 g topically 4 (four) times daily as needed. 07/27/23   [name], PA-C  famotidine  (PEPCID ) 20 MG tablet Take 1 tablet (20 mg total) by mouth 2 (two) times daily. 09/20/23   [name], PA-C  ferrous sulfate  325 (65 FE) MG tablet Take 650 mg by mouth daily with breakfast.    [provider]  fludrocortisone  (FLORINEF ) 0.1 MG tablet TAKE ONE TABLET BY MOUTH TWICE DAILY 11/22/23   Wendling, Mabel Mt, DO  GAMMAGARD 20 GM/200ML SOLN Inject 40 g into the vein every 21 ( twenty-one) days. INFUSE 40G INTRAVENOUSLY EVERY 3 WEEKS 02/27/22   [provider]  hyoscyamine  (LEVBID )  0.375 MG 12 hr tablet Take 1 tablet (0.375 mg total) by mouth 2 (two) times daily. 01/03/24   Wendling, Mabel Mt, DO  KEVZARA 200 MG/1. SOAJ Inject 1.14 mLs into the skin every 14 (fourteen) days. 09/09/22   [provider]  levothyroxine  (SYNTHROID ) 75 MCG tablet TAKE ONE (1) TABLET BY MOUTH EACH DAY BEFORE BREAKFAST ON EMPTY STOMACH 07/19/23   [name], DO  lidocaine  4 % Place 1 patch onto the skin daily. Patient taking differently: Place 1 patch onto the skin daily as needed. 07/08/23   [name], MD  magnesium  oxide (MAG-OX) 400 MG tablet Take 400 mg by mouth daily.     [provider]  midodrine  (PROAMATINE ) 10 MG tablet TAKE ONE TABLET BY MOUTH 3 TIMES DAILY 01/24/24   [name], DO  MYRBETRIQ  50 MG TB24 tablet Take 50 mg by mouth every evening. 10/07/22   [provider]  naloxone  (NARCAN ) nasal spray 4 mg/0.1 mL Spray up nostril in event of opioid overdose. 06/28/23   [name], DO  pregabalin  (LYRICA ) 50 MG capsule Take 1 capsule (50 mg total) by mouth 3 (three) times daily. 01/03/24   [name], DO  promethazine  (PHENERGAN ) 12.5 MG tablet Take 1 tablet (12.5 mg total) by mouth every 6 (six) hours as needed for nausea or vomiting. 01/03/24   Frann, Mabel Mt, DO  Rimegepant Sulfate (NURTEC) 75 MG TBDP Dissolve 1 tab under the tongue  daily as needed for migraines. Repeat in 2 hours if no improvement. 01/03/24   [name], DO  sucralfate  (CARAFATE ) 1 GM/10ML suspension SMARTSIG:Milliliter(s) By Mouth 12/13/23   [provider]  Suzetrigine  (JOURNAVX ) 50 MG TABS Take 1 tablet by mouth 2 (two) times daily as needed. 01/18/24   [name], [name] SQUIBB, MD  tamsulosin  (FLOMAX ) 0.4 MG CAPS capsule Take 0.4 mg by mouth daily. 11/25/23   [provider]  triamcinolone  cream (KENALOG ) 0.1 % Apply 1 application  topically 2 (two) times daily as needed (rash).    [provider]  ZINC  OXIDE, TOPICAL, 10 % CREA Apply 1 application topically 2 (two) times daily as needed (rash). 02/14/21   [name], MD    Allergies: Butorphanol, Erythromycin base, Propoxyphene, Sumatriptan , Tetracycline, Ciprofloxacin, Corticosteroids, Doxycycline , Cefixime, Isometheptene-dichloral-apap, Ketorolac , Prednisone , Promethazine , and Tape    Review of Systems  Updated Vital Signs BP (!) 111/49   Pulse 70   Temp 98 F (36.7 C) (Oral)   Resp 14   Ht 5' 4 (1.626 m)   Wt 57.2 kg   SpO2 100%   BMI 21.63 kg/m   Physical Exam Vitals and nursing note reviewed.  Constitutional:      General: She is not in acute distress.    Appearance: She is not toxic-appearing.  HENT:     Head: Normocephalic.     Nose: Nose normal.     Mouth/Throat:     Mouth: Mucous membranes are moist.   Cardiovascular:     Rate and Rhythm: Normal rate and regular rhythm.  Pulmonary:     Effort: Pulmonary effort is normal.  Abdominal:     General: Abdomen is flat. There is no distension.     Tenderness: There is no abdominal tenderness. There is no guarding or rebound.   Musculoskeletal:        General: Normal range of motion.   Skin:    General: Skin is warm.     Capillary Refill: Capillary refill takes less than 2 seconds.   Neurological:     Mental Status: She is alert and oriented to person, place, and time.   Psychiatric:        Mood and Affect: Mood normal.        Behavior: Behavior normal.     (all labs ordered are listed, but only abnormal results are displayed) Labs Reviewed  TROPONIN I (HIGH SENSITIVITY) - Abnormal; Notable for the following components:      Result Value   Troponin I (High Sensitivity) 472 (*)    All other components within normal limits  TROPONIN I (HIGH SENSITIVITY)    EKG: EKG Interpretation Date/Time:  Monday February 07 2024 22:38:34 EDT Ventricular Rate:  75 PR Interval:  154 QRS Duration:  136 QT Interval:  413 QTC Calculation: 462 R  Axis:   50  Text Interpretation: Sinus rhythm Left bundle branch block Confirmed by Neysa Clap [phone]) on 02/07/2024 11:40:35 PM  Radiology: CT ABDOMEN PELVIS WO CONTRAST Result Date: 02/07/2024 CLINICAL DATA:  Left lower quadrant abdominal pain, weakness, fatigue, hypotensive EXAM: CT ABDOMEN AND PELVIS WITHOUT CONTRAST TECHNIQUE: Multidetector CT imaging of the abdomen and pelvis was performed following the standard protocol without IV contrast. RADIATION DOSE REDUCTION: This exam was performed according to the departmental dose-optimization program which includes automated exposure control, adjustment of the mA and/or kV according to patient size and/or use of iterative reconstruction technique. COMPARISON:  CT 11/29/2023 FINDINGS: Lower chest: Stable scarring in the  right lower lobe. No acute abnormality. Hepatobiliary: Cholecystectomy. Unremarkable liver and biliary tree. Pancreas: Unremarkable. Spleen: Unremarkable. Adrenals/Urinary Tract: Normal adrenal glands. No urinary calculi or hydronephrosis. Unremarkable bladder. Stomach/Bowel: Normal caliber large and small bowel. Question mild bowel wall thickening of the transverse and descending colon versus underdistention. The appendix is not visualized. Postoperative change of gastric bypass. Vascular/Lymphatic: Aortic atherosclerosis. No enlarged abdominal or pelvic lymph nodes. Reproductive: Hysterectomy.  No adnexal mass. Other: No free intraperitoneal fluid or air. Musculoskeletal: No acute fracture.  Neuro stimulators. IMPRESSION: 1. Question mild colitis of the transverse and descending colon versus underdistention 2. Aortic Atherosclerosis (ICD10-I70.0). Electronically Signed   By: Norman Gatlin M.D.   On: 02/07/2024 17:30     Procedures   Medications Ordered in the ED - No data to display  Clinical Course as of 02/08/24 0016  Mon Feb 07, 2024  2223 Last echo:   1. Left ventricular ejection fraction, by estimation, is 60 to 65%. The   left ventricle has normal function. The left ventricle has no regional  wall motion abnormalities. Left ventricular diastolic parameters are  consistent with Grade I diastolic  dysfunction (impaired relaxation).   2. Right ventricular systolic function is normal. The right ventricular  size is normal.   3. The mitral valve is normal in structure. No evidence of mitral valve  regurgitation. No evidence of mitral stenosis.   4. The aortic valve is normal in structure. Aortic valve regurgitation is  not visualized. No aortic stenosis is present.   5. The inferior vena cava is normal in size with greater than 50%  respiratory variability, suggesting right atrial pressure of 3 mmHg.    [TY]  2310 Spoke with cardiology, given history and after reviewing chart feel that elevated troponin likely secondary to demand and patient's temporary hypotension.  Thinks as long as repeat troponin does not double can follow-up outpatient with cardiology. [TY]    Clinical Course User Index [TY] Neysa Caron PARAS, DO                                 Medical Decision Making 78 year old presenting with elevated troponin.  Afebrile nontachycardic hemodynamically stable.  EKG similar to prior.  Repeat troponin with mild rise compared to prior.  Did discuss with cardiology findings.  See ED course.  Patient remains in symptomatic.  Will discharge with close follow-up.  Strict return precautions given.  Amount and/or Complexity of Data Reviewed ECG/medicine tests: ordered.      Final diagnoses:  Elevated troponin    ED Discharge Orders     None          Neysa Caron PARAS, DO 02/08/24 0016

## 2024-02-10 DIAGNOSIS — G259 Extrapyramidal and movement disorder, unspecified: Secondary | ICD-10-CM | POA: Diagnosis not present

## 2024-02-10 DIAGNOSIS — R278 Other lack of coordination: Secondary | ICD-10-CM | POA: Diagnosis not present

## 2024-02-10 DIAGNOSIS — F444 Conversion disorder with motor symptom or deficit: Secondary | ICD-10-CM | POA: Diagnosis not present

## 2024-02-10 DIAGNOSIS — R29898 Other symptoms and signs involving the musculoskeletal system: Secondary | ICD-10-CM | POA: Diagnosis not present

## 2024-02-10 DIAGNOSIS — Z789 Other specified health status: Secondary | ICD-10-CM | POA: Diagnosis not present

## 2024-02-10 DIAGNOSIS — Z7409 Other reduced mobility: Secondary | ICD-10-CM | POA: Diagnosis not present

## 2024-02-12 LAB — CULTURE, BLOOD (ROUTINE X 2)
Culture: NO GROWTH
Culture: NO GROWTH
Special Requests: ADEQUATE
Special Requests: ADEQUATE

## 2024-02-15 ENCOUNTER — Other Ambulatory Visit: Payer: Self-pay | Admitting: Family Medicine

## 2024-02-15 ENCOUNTER — Other Ambulatory Visit: Payer: Self-pay | Admitting: *Deleted

## 2024-02-15 ENCOUNTER — Other Ambulatory Visit: Payer: Self-pay

## 2024-02-15 ENCOUNTER — Ambulatory Visit: Admitting: Family Medicine

## 2024-02-15 ENCOUNTER — Other Ambulatory Visit

## 2024-02-15 ENCOUNTER — Encounter: Payer: Self-pay | Admitting: Family Medicine

## 2024-02-15 VITALS — BP 100/56 | HR 85 | Temp 98.0°F | Resp 16 | Ht 64.0 in | Wt 126.0 lb

## 2024-02-15 DIAGNOSIS — R7989 Other specified abnormal findings of blood chemistry: Secondary | ICD-10-CM

## 2024-02-15 DIAGNOSIS — E876 Hypokalemia: Secondary | ICD-10-CM

## 2024-02-15 DIAGNOSIS — M810 Age-related osteoporosis without current pathological fracture: Secondary | ICD-10-CM

## 2024-02-15 DIAGNOSIS — Z1231 Encounter for screening mammogram for malignant neoplasm of breast: Secondary | ICD-10-CM

## 2024-02-15 MED ORDER — DENOSUMAB 60 MG/ML ~~LOC~~ SOSY
60.0000 mg | PREFILLED_SYRINGE | Freq: Once | SUBCUTANEOUS | 0 refills | Status: DC
  Filled 2024-02-15: qty 1, 1d supply, fill #0
  Filled 2024-02-24: qty 1, 180d supply, fill #0

## 2024-02-15 MED ORDER — POTASSIUM CHLORIDE ER 10 MEQ PO TBCR
10.0000 meq | EXTENDED_RELEASE_TABLET | Freq: Four times a day (QID) | ORAL | 2 refills | Status: DC
Start: 2024-02-15 — End: 2024-02-15

## 2024-02-15 MED ORDER — POTASSIUM CHLORIDE ER 10 MEQ PO TBCR: 10.0000 meq | EXTENDED_RELEASE_TABLET | Freq: Four times a day (QID) | ORAL | 0 refills | Status: AC

## 2024-02-15 NOTE — Patient Instructions (Signed)
 Send me a message if you are not scheduled in the next 1-2 weeks.  Give us  2-3 business days to get the results of your labs back.   Let us  know if you need anything.

## 2024-02-15 NOTE — Progress Notes (Signed)
 Chief Complaint  Patient presents with   Hospitalization Follow-up    Hospital Follow Up     Subjective: Patient is a 78 y.o. female here for ER f/u.  Patient went to the ER 1 week ago for hypotension and syncope.  She was on the toilet and had an episode where she felt lightheaded.  EMS was contacted.  She checked her blood pressure and it was in the 70s systolic.  She ate and drank a little bit and came up a little bit.  By the time EMS got to her it was even higher but they did take her to the emergency department.  Blood pressure was reassuring at that time.  Troponin was obtained and came back at 319.  This is much higher than her previous baseline so they had her come back to the ER and repeat was 472.  She denied any chest pain or shortness of breath.  She reports they wanted to admit her but she declined.  She reached out to her cardiologist who cannot get her in for another 2 months.  She continues to be free of chest pain or any shortness of breath.  She is not having any swelling in her lower extremities.  She has a history of chronic hypokalemia.  She takes Klor-Con  10 mEq 4 times daily.  She ran out as the pharmacy did not have a refill.  She is requesting more.  Past Medical History:  Diagnosis Date   Anemia    Arachnoiditis    Bipolar disorder (HCC)    Cataract    removed   Chronic post-thoracotomy pain    CKD (chronic kidney disease)    GERD (gastroesophageal reflux disease)    Hypogammaglobulinemia (HCC)    Hypotension    Hypothyroid    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HCC)    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HCC)    Low blood pressure    Lung cancer (HCC) 2011, 2014   Memory loss    Migraine    Opioid abuse (HCC)    Osteoporosis    PMR (polymyalgia rheumatica) (HCC) 03/17/2022   Presence of neurostimulator    Restless legs    Sleep apnea     Objective: BP (!) 100/56 (BP Location: Left Arm, Patient Position: Sitting)   Pulse 85   Temp 98 F (36.7 C) (Oral)    Resp 16   Ht 5' 4 (1.626 m)   Wt 126 lb (57.2 kg)   SpO2 99%   BMI 21.63 kg/m  General: Awake, appears stated age Heart: RRR, no LE edema Lungs: CTAB, no rales, wheezes or rhonchi. No accessory muscle use Psych: Age appropriate judgment and insight, normal affect and mood  Assessment and Plan: Elevated troponin - Plan: Ambulatory referral to Cardiology  Hypokalemia - Plan: Basic metabolic panel with GFR, potassium chloride  (KLOR-CON ) 10 MEQ tablet, DISCONTINUED: potassium chloride  (KLOR-CON ) 10 MEQ tablet  No signs of chest pain or exertional symptoms today.  Will place a referral to the cardiology team upstairs as this is easier for her to get to.  Could have been demand ischemia related to hypotension causing the elevation in troponin.  If cardiology cannot get her in in the next couple weeks, I would consider getting an echo to look for wall motion abnormalities.  She will let me know on MyChart. Refill above.  Monitor today. Follow-up as originally scheduled. The patient voiced understanding and agreement to the plan.  I spent 30 minutes with the patient discussing the above plans  in addition to reviewing the chart on the same day of the visit.  Mabel Mt [place], DO 02/15/24  4:06 PM   ]

## 2024-02-16 ENCOUNTER — Ambulatory Visit: Payer: Self-pay | Admitting: Family Medicine

## 2024-02-16 ENCOUNTER — Ambulatory Visit
Admission: RE | Admit: 2024-02-16 | Discharge: 2024-02-16 | Disposition: A | Discharge status: Home / Self Care | Source: Ambulatory Visit | Attending: Family Medicine | Admitting: Family Medicine

## 2024-02-16 DIAGNOSIS — Z1231 Encounter for screening mammogram for malignant neoplasm of breast: Secondary | ICD-10-CM

## 2024-02-16 LAB — BASIC METABOLIC PANEL WITH GFR
BUN: 45 mg/dL — ABNORMAL HIGH (ref 6–23)
CO2: 30 meq/L (ref 19–32)
Calcium: 9.5 mg/dL (ref 8.4–10.5)
Chloride: 104 meq/L (ref 96–112)
Creatinine, Ser: 1.4 mg/dL — ABNORMAL HIGH (ref 0.40–1.20)
GFR: 36.78 mL/min — ABNORMAL LOW (ref >60.00)
Glucose, Bld: 100 mg/dL — ABNORMAL HIGH (ref 70–99)
Potassium: 3.5 meq/L (ref 3.5–5.1)
Sodium: 144 meq/L (ref 135–145)

## 2024-02-22 DIAGNOSIS — Z789 Other specified health status: Secondary | ICD-10-CM | POA: Diagnosis not present

## 2024-02-22 DIAGNOSIS — F444 Conversion disorder with motor symptom or deficit: Secondary | ICD-10-CM | POA: Diagnosis not present

## 2024-02-22 DIAGNOSIS — G259 Extrapyramidal and movement disorder, unspecified: Secondary | ICD-10-CM | POA: Diagnosis not present

## 2024-02-22 DIAGNOSIS — R278 Other lack of coordination: Secondary | ICD-10-CM | POA: Diagnosis not present

## 2024-02-22 DIAGNOSIS — R29898 Other symptoms and signs involving the musculoskeletal system: Secondary | ICD-10-CM | POA: Diagnosis not present

## 2024-02-22 DIAGNOSIS — Z7409 Other reduced mobility: Secondary | ICD-10-CM | POA: Diagnosis not present

## 2024-02-24 ENCOUNTER — Other Ambulatory Visit: Payer: Self-pay

## 2024-02-24 DIAGNOSIS — Z7409 Other reduced mobility: Secondary | ICD-10-CM | POA: Diagnosis not present

## 2024-02-24 DIAGNOSIS — F444 Conversion disorder with motor symptom or deficit: Secondary | ICD-10-CM | POA: Diagnosis not present

## 2024-02-24 DIAGNOSIS — R278 Other lack of coordination: Secondary | ICD-10-CM | POA: Diagnosis not present

## 2024-02-24 DIAGNOSIS — R29898 Other symptoms and signs involving the musculoskeletal system: Secondary | ICD-10-CM | POA: Diagnosis not present

## 2024-02-24 DIAGNOSIS — Z789 Other specified health status: Secondary | ICD-10-CM | POA: Diagnosis not present

## 2024-02-24 DIAGNOSIS — G259 Extrapyramidal and movement disorder, unspecified: Secondary | ICD-10-CM | POA: Diagnosis not present

## 2024-02-24 NOTE — Progress Notes (Signed)
 Specialty Pharmacy Refill Coordination Note  Megan Brewer is a 78 y.o. female contacted today regarding refills of specialty medication(s) Denosumab  (PROLIA )   Patient requested Courier to Provider Office   Delivery date: 03/01/24   Verified address: [facility]- [address]   Medication will be filled on 02/29/24.

## 2024-02-28 ENCOUNTER — Other Ambulatory Visit: Payer: Self-pay | Admitting: Family Medicine

## 2024-02-28 DIAGNOSIS — I951 Orthostatic hypotension: Secondary | ICD-10-CM

## 2024-02-28 DIAGNOSIS — N3021 Other chronic cystitis with hematuria: Secondary | ICD-10-CM | POA: Diagnosis not present

## 2024-02-29 ENCOUNTER — Other Ambulatory Visit: Payer: Self-pay

## 2024-03-03 ENCOUNTER — Ambulatory Visit: Attending: Cardiology | Admitting: Cardiology

## 2024-03-03 ENCOUNTER — Encounter: Payer: Self-pay | Admitting: Cardiology

## 2024-03-03 VITALS — BP 116/80 | HR 93 | Ht 64.0 in | Wt 140.0 lb

## 2024-03-03 DIAGNOSIS — I959 Hypotension, unspecified: Secondary | ICD-10-CM

## 2024-03-03 DIAGNOSIS — R0602 Shortness of breath: Secondary | ICD-10-CM

## 2024-03-03 NOTE — Progress Notes (Signed)
 Cardiology Office Note:   Date:  03/03/2024  ID:  Megan Brewer, Megan Brewer May 12, 1946, MRN [id number] PCP: [name], DO  [facility] Providers Cardiologist:  Lynwood Schilling, MD {  History of Present Illness:   Megan Brewer is a 78 y.o. female who presents for evaluation of dizziness.   She was seen by Dr. Edwyna previously.    She was added to my schedule because of this.  She has had multiple ER visits with orthostatic hypotension treated with midodrine .   I looked at her last hospitalization which was actually in April.  She had hypotension with a UTI.  She had some borderline troponins and not thought to have any acute coronary syndrome.  She has a history of heart failure with preserved ejection fraction but was not thought to be volume overloaded.  Her most recent ER visit was late last month.   She was actually there with dehydration.  She has had about a dozen ER visits in the last 12 months.  While there they noted her troponin to be elevated at 319.  They sent her home without a trend troponin and then called her back to get blood work drawn.  It was 472 but she did not want to stay in the hospital.  She was called today for routine add-on as she has been waiting for an appointment to follow-up on this.  She has not been getting any chest pressure, neck or arm discomfort.  She has not been getting any new shortness of breath, PND or orthopnea.  She was hypotensive and very weak when she came to the office but she says this is the way she feels intermittently.  Her blood pressure initially was very low but it did improved.  She says this happens all the time at home and it is definitely demonstrated with multiple ER visits.  She said they do not present if it comes back up and she feels back to baseline now since it came back up.  She does have a urinary tract infection and was just got her prescription for her antibiotic.  ROS: As stated in the HPI and negative  for all other systems.  Studies Reviewed:    EKG:   EKG Interpretation Date/Time:  Friday March 03 2024 11:10:25 EDT Ventricular Rate:  81 PR Interval:  148 QRS Duration:  136 QT Interval:  414 QTC Calculation: 480 R Axis:   19  Text Interpretation: Normal sinus rhythm Left bundle branch block When compared with ECG of 07-Feb-2024 22:38, No significant change since last tracing Confirmed by Schilling Lynwood ([id number]) on 03/03/2024 11:31:28 AM NA  Risk Assessment/Calculations:     Physical Exam:   VS:  BP 116/80   Pulse 93   Ht 5' 4 (1.626 m)   Wt 140 lb (63.5 kg)   SpO2 97%   BMI 24.03 kg/m    Wt Readings from Last 3 Encounters:  03/03/24 140 lb (63.5 kg)  02/15/24 126 lb (57.2 kg)  02/07/24 126 lb (57.2 kg)     GEN: Well nourished, well developed in no acute distress NECK: No JVD; No carotid bruits CARDIAC: RRR, 3 out of 6 apical and left mid sternal border systolic murmur, no diastolic murmurs, rubs, gallops RESPIRATORY:  Clear to auscultation without rales, wheezing or rhonchi  ABDOMEN: Soft, non-tender, non-distended EXTREMITIES:  No edema; No deformity   ASSESSMENT AND PLAN:   Mild to Mod MR and TR:    She had echo  in October 2024 and had normal left ventricular function.  There actually was no evidence of mitral regurgitation on this echo.  There is no evidence of tricuspid regurgitation.  He has slight aortic valve gradient of 10 mm mean.  I will follow this clinically  Hypotension:   She had an episode of transient hypotension in the office but was back to baseline.  She does not want to be hospitalized as she feels no different than she usually feels.  She was not having any acute complaints.  She is being treated for the urinary infection which is caused symptoms.  She promises to call 911 if she does not do well in the next couple of days with this.    Elevated troponin: She is not having any chest discomfort.  However, given this I am going to bring her back for a  perfusion study.    Follow up with APP in six months.   Signed, Lynwood Schilling, MD

## 2024-03-03 NOTE — Patient Instructions (Signed)
 Medication Instructions:  Your physician recommends that you continue on your current medications as directed. Please refer to the Current Medication list given to you today.  *If you need a refill on your cardiac medications before your next appointment, please call your pharmacy*  Lab Work: NONE If you have labs (blood work) drawn today and your tests are completely normal, you will receive your results only by: MyChart Message (if you have MyChart) OR A paper copy in the mail If you have any lab test that is abnormal or we need to change your treatment, we will call you to review the results.  Testing/Procedures: Lexiscan  Myoview  Stress Test Your physician has requested that you have a lexiscan  myoview . For further information please visit [email or web address] Please follow instruction sheet, as given.   Follow-Up: At [facility], you and your health needs are our priority.  As part of our continuing mission to provide you with exceptional heart care, our providers are all part of one team.  This team includes your primary Cardiologist (physician) and Advanced Practice Providers or APPs (Physician Assistants and Nurse Practitioners) who all work together to provide you with the care you need, when you need it.  Your next appointment:   4 months   Provider:   APP  We recommend signing up for the patient portal called MyChart.  Sign up information is provided on this After Visit Summary.  MyChart is used to connect with patients for Virtual Visits (Telemedicine).  Patients are able to view lab/test results, encounter notes, upcoming appointments, etc.  Non-urgent messages can be sent to your provider as well.   To learn more about what you can do with MyChart, go to [email or web address].

## 2024-03-06 ENCOUNTER — Telehealth: Payer: Self-pay | Admitting: *Deleted

## 2024-03-06 ENCOUNTER — Ambulatory Visit (INDEPENDENT_AMBULATORY_CARE_PROVIDER_SITE_OTHER): Admitting: *Deleted

## 2024-03-06 ENCOUNTER — Encounter: Payer: Medicare PPO | Admitting: Family Medicine

## 2024-03-06 DIAGNOSIS — M810 Age-related osteoporosis without current pathological fracture: Secondary | ICD-10-CM | POA: Diagnosis not present

## 2024-03-06 MED ORDER — DENOSUMAB 60 MG/ML ~~LOC~~ SOSY: 60.0000 mg | PREFILLED_SYRINGE | SUBCUTANEOUS | Status: AC

## 2024-03-06 NOTE — Telephone Encounter (Signed)
 With SOB and asymmetric leg swelling, I'd like her seen in ER. She goes there often but a clot in the lung is worrisome just hearing that. 1145 tomorrow otherwise. Thx.

## 2024-03-06 NOTE — Telephone Encounter (Signed)
 Patient came in today for Prolia  injection.  She stated that she has gained about 15lbs (135lbs to 150lbs) in 6 weeks and she has also had some SOB with it.  I checked her pulse ox and it was 98% and pulse was 86.  Her legs were swollen, right leg was more swollen than the left.  She stated that she did have some chest pain when she has to work to breath.  Advised an appointment but she would like to only see you.  I advised that I would send you a message but if she gets worse with worsening SOB, chest pains to go to ER or call 911 immediately.  Would you be able to work her in tomorrow or would you like for her to go ahead and go to ER?

## 2024-03-06 NOTE — Telephone Encounter (Signed)
 Pt refused ER and scheduled for 1145am.  Advised again if she gets worse to go to ER immediately.

## 2024-03-06 NOTE — Progress Notes (Signed)
 Patient here for Prolia  injection per physicians orders  Prolia  60 mg SQ , was administered right arm today. Patient tolerated injection.  Patient next injection due: 6 months, appt made:  No- will schedule in 5 months after benefits are ran again  Initial injection: no  Did Prolia  come from pharmacy (if yes please select patient supplied): yes  Cam placed for next injection: yes

## 2024-03-07 ENCOUNTER — Ambulatory Visit: Payer: Self-pay | Admitting: Family Medicine

## 2024-03-07 ENCOUNTER — Ambulatory Visit: Admitting: Family Medicine

## 2024-03-07 ENCOUNTER — Ambulatory Visit (HOSPITAL_BASED_OUTPATIENT_CLINIC_OR_DEPARTMENT_OTHER)
Admission: RE | Admit: 2024-03-07 | Discharge: 2024-03-07 | Disposition: A | Discharge status: Home / Self Care | Source: Ambulatory Visit | Attending: Family Medicine | Admitting: Family Medicine

## 2024-03-07 ENCOUNTER — Encounter: Payer: Self-pay | Admitting: Family Medicine

## 2024-03-07 VITALS — BP 100/52 | HR 51 | Temp 98.0°F | Resp 16 | Ht 64.0 in | Wt 140.0 lb

## 2024-03-07 DIAGNOSIS — R0600 Dyspnea, unspecified: Secondary | ICD-10-CM | POA: Diagnosis not present

## 2024-03-07 DIAGNOSIS — R635 Abnormal weight gain: Secondary | ICD-10-CM | POA: Diagnosis not present

## 2024-03-07 DIAGNOSIS — K2289 Other specified disease of esophagus: Secondary | ICD-10-CM | POA: Diagnosis not present

## 2024-03-07 DIAGNOSIS — Z452 Encounter for adjustment and management of vascular access device: Secondary | ICD-10-CM | POA: Diagnosis not present

## 2024-03-07 LAB — COMPREHENSIVE METABOLIC PANEL WITH GFR
ALT: 34 U/L (ref 0–35)
AST: 41 U/L — ABNORMAL HIGH (ref 0–37)
Albumin: 3.6 g/dL (ref 3.5–5.2)
Alkaline Phosphatase: 79 U/L (ref 39–117)
BUN: 32 mg/dL — ABNORMAL HIGH (ref 6–23)
CO2: 29 meq/L (ref 19–32)
Calcium: 9.6 mg/dL (ref 8.4–10.5)
Chloride: 100 meq/L (ref 96–112)
Creatinine, Ser: 1.45 mg/dL — ABNORMAL HIGH (ref 0.40–1.20)
GFR: 35.25 mL/min — ABNORMAL LOW (ref >60.00)
Glucose, Bld: 101 mg/dL — ABNORMAL HIGH (ref 70–99)
Potassium: 4.4 meq/L (ref 3.5–5.1)
Sodium: 139 meq/L (ref 135–145)
Total Bilirubin: 0.3 mg/dL (ref 0.2–1.2)
Total Protein: 6.4 g/dL (ref 6.0–8.3)

## 2024-03-07 LAB — CBC
HCT: 42.5 % (ref 36.0–46.0)
Hemoglobin: 13.9 g/dL (ref 12.0–15.0)
MCHC: 32.8 g/dL (ref 30.0–36.0)
MCV: 90.7 fl (ref 78.0–100.0)
Platelets: 167 K/uL (ref 150.0–400.0)
RBC: 4.68 Mil/uL (ref 3.87–5.11)
RDW: 13.8 % (ref 11.5–15.5)
WBC: 5 K/uL (ref 4.0–10.5)

## 2024-03-07 LAB — TSH: TSH: 1.75 u[IU]/mL (ref 0.35–5.50)

## 2024-03-07 NOTE — Patient Instructions (Signed)
 Give us  2-3 business days to get the results of your labs back.   Keep the diet clean and stay active.  If you get worse, please call 911.  We will be in touch with your imaging results.   Let us  know if you need anything.

## 2024-03-07 NOTE — Progress Notes (Signed)
 Chief Complaint  Patient presents with   Fatigue    Fatigue    Subjective: Patient is a 78 y.o. female here for weight gain.  She has gained around 15 lbs in the last couple weeks. She was started on an antibiotic from urology and have exacerbated her s/s's. She feels swollen, worse in the torso region. She has had pain here also. No fevers, coughing, surgeries, trauma, hx of clots, swelling or pain in her legs. She does have congestion feeling like she is breathing thru cotton.   Past Medical History:  Diagnosis Date   Anemia    Arachnoiditis    Bipolar disorder (HCC)    Cataract    removed   Chronic post-thoracotomy pain    CKD (chronic kidney disease)    GERD (gastroesophageal reflux disease)    Hypogammaglobulinemia (HCC)    Hypotension    Hypothyroid    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HCC)    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HCC)    Low blood pressure    Lung cancer (HCC) 2011, 2014   Memory loss    Migraine    Opioid abuse (HCC)    Osteoporosis    PMR (polymyalgia rheumatica) (HCC) 03/17/2022   Presence of neurostimulator    Restless legs    Sleep apnea     Objective: BP (!) 100/52 (BP Location: Left Arm, Patient Position: Sitting)   Pulse (!) 51   Temp 98 F (36.7 C) (Oral)   Resp 16   Ht 5' 4 (1.626 m)   Wt 140 lb (63.5 kg)   SpO2 98%   BMI 24.03 kg/m  General: Awake, appears very fatigued Heart: RRR, no LE edema Lungs: CTAB, no rales, wheezes or rhonchi. No accessory muscle use Mouth: MMM Psych: Age appropriate judgment and insight, normal affect and mood  Assessment and Plan: Weight gain - Plan: CBC, Comprehensive metabolic panel with GFR, TSH  Dyspnea, unspecified type - Plan: DG Chest 2 View  2/2 fluid buildup? Ck labs. AE of med? Ck CXR to r/o edema. Concern for Macrobid  contributing to this, but will see what workup shows. If she gets worse, she will go to the ER/call 911. Offered to take her there today which we agreed to hold off on for  now.  The patient voiced understanding and agreement to the plan.  I spent 32 minutes with the patient discussing the above plans in addition to reviewing her chart on the same day of the visit.  Mabel Mt [place], DO 03/07/24  12:15 PM

## 2024-03-08 ENCOUNTER — Telehealth (HOSPITAL_COMMUNITY): Payer: Self-pay | Admitting: *Deleted

## 2024-03-08 ENCOUNTER — Encounter (HOSPITAL_COMMUNITY): Payer: Self-pay | Admitting: *Deleted

## 2024-03-08 NOTE — Telephone Encounter (Signed)
 Letter sent with instructions for upcoming stress test via [employer]. [place] Bees, RN

## 2024-03-10 ENCOUNTER — Other Ambulatory Visit: Payer: Self-pay | Admitting: Cardiology

## 2024-03-10 DIAGNOSIS — R0602 Shortness of breath: Secondary | ICD-10-CM

## 2024-03-13 ENCOUNTER — Encounter (HOSPITAL_COMMUNITY): Payer: Self-pay | Admitting: *Deleted

## 2024-03-14 DIAGNOSIS — F444 Conversion disorder with motor symptom or deficit: Secondary | ICD-10-CM | POA: Diagnosis not present

## 2024-03-14 DIAGNOSIS — Z7409 Other reduced mobility: Secondary | ICD-10-CM | POA: Diagnosis not present

## 2024-03-14 DIAGNOSIS — M797 Fibromyalgia: Secondary | ICD-10-CM | POA: Diagnosis not present

## 2024-03-14 DIAGNOSIS — G894 Chronic pain syndrome: Secondary | ICD-10-CM | POA: Diagnosis not present

## 2024-03-14 DIAGNOSIS — G259 Extrapyramidal and movement disorder, unspecified: Secondary | ICD-10-CM | POA: Diagnosis not present

## 2024-03-15 ENCOUNTER — Ambulatory Visit (HOSPITAL_COMMUNITY)
Admission: RE | Admit: 2024-03-15 | Discharge: 2024-03-15 | Disposition: A | Discharge status: Home / Self Care | Source: Ambulatory Visit | Attending: Cardiovascular Disease | Admitting: Cardiovascular Disease

## 2024-03-15 DIAGNOSIS — R0602 Shortness of breath: Secondary | ICD-10-CM | POA: Diagnosis not present

## 2024-03-15 LAB — MYOCARDIAL PERFUSION IMAGING
LV dias vol: 38 mL (ref 46–106)
LV sys vol: 3 mL (ref 3.8–5.2)
Nuc Stress EF: 92 %
Peak HR: 109 {beats}/min
Rest HR: 81 {beats}/min
Rest Nuclear Isotope Dose: 10.7 mCi
SDS: 2
SRS: 6
SSS: 8
Stress Nuclear Isotope Dose: 31.3 mCi
TID: 1.06

## 2024-03-15 MED ORDER — REGADENOSON 0.4 MG/5ML IV SOLN
INTRAVENOUS | Status: AC
  Filled 2024-03-15: qty 5

## 2024-03-15 MED ORDER — TECHNETIUM TC 99M TETROFOSMIN IV KIT
10.7000 | PACK | Freq: Once | INTRAVENOUS | Status: AC | PRN
  Administered 2024-03-15: 10.7 via INTRAVENOUS

## 2024-03-15 MED ORDER — TECHNETIUM TC 99M TETROFOSMIN IV KIT
31.3000 | PACK | Freq: Once | INTRAVENOUS | Status: AC | PRN
  Administered 2024-03-15: 31.3 via INTRAVENOUS

## 2024-03-15 MED ORDER — REGADENOSON 0.4 MG/5ML IV SOLN
0.4000 mg | Freq: Once | INTRAVENOUS | Status: AC
  Administered 2024-03-15: 0.4 mg via INTRAVENOUS

## 2024-03-16 ENCOUNTER — Ambulatory Visit

## 2024-03-16 ENCOUNTER — Ambulatory Visit: Payer: Self-pay | Admitting: Cardiology

## 2024-03-16 VITALS — BP 81/57 | HR 80 | Temp 97.7°F | Resp 16 | Ht 64.0 in | Wt 140.0 lb

## 2024-03-16 DIAGNOSIS — M353 Polymyalgia rheumatica: Secondary | ICD-10-CM

## 2024-03-16 DIAGNOSIS — M2550 Pain in unspecified joint: Secondary | ICD-10-CM

## 2024-03-16 MED ORDER — HEPARIN SOD (PORK) LOCK FLUSH 100 UNIT/ML IV SOLN
500.0000 [IU] | Freq: Once | INTRAVENOUS | Status: AC | PRN
  Administered 2024-03-16: 500 [IU]
  Filled 2024-03-16: qty 5

## 2024-03-16 MED ORDER — SODIUM CHLORIDE 0.9 % IV SOLN
1000.0000 mg | Freq: Once | INTRAVENOUS | Status: AC
Start: 2024-03-16 — End: 2024-03-16
  Administered 2024-03-16: 1000 mg via INTRAVENOUS
  Filled 2024-03-16: qty 16

## 2024-03-16 NOTE — Progress Notes (Signed)
 Diagnosis: Polyarthralgia  Provider:  Mannam, Praveen MD  Procedure: IV Infusion  IV Type: Port a Cath, IV Location: R Chest  Solumedrol (Methylprednisolone ), Dose: 1000 mg  Infusion Start Time: 1328  Infusion Stop Time: 1426  Post Infusion IV Care: Patient declined observation and Port a Cath Deaccessed/Flushed and heparin  locked  Discharge: Condition: Good, Destination: Home . AVS Provided  Performed by:  Rocky FORBES Sar, RN

## 2024-03-17 DIAGNOSIS — Z789 Other specified health status: Secondary | ICD-10-CM | POA: Diagnosis not present

## 2024-03-17 DIAGNOSIS — R278 Other lack of coordination: Secondary | ICD-10-CM | POA: Diagnosis not present

## 2024-03-17 DIAGNOSIS — Z7409 Other reduced mobility: Secondary | ICD-10-CM | POA: Diagnosis not present

## 2024-03-17 DIAGNOSIS — R29898 Other symptoms and signs involving the musculoskeletal system: Secondary | ICD-10-CM | POA: Diagnosis not present

## 2024-03-17 DIAGNOSIS — G259 Extrapyramidal and movement disorder, unspecified: Secondary | ICD-10-CM | POA: Diagnosis not present

## 2024-03-17 DIAGNOSIS — F444 Conversion disorder with motor symptom or deficit: Secondary | ICD-10-CM | POA: Diagnosis not present

## 2024-03-19 DIAGNOSIS — N3000 Acute cystitis without hematuria: Secondary | ICD-10-CM | POA: Diagnosis not present

## 2024-03-21 ENCOUNTER — Ambulatory Visit (INDEPENDENT_AMBULATORY_CARE_PROVIDER_SITE_OTHER)

## 2024-03-21 VITALS — Ht 64.0 in | Wt 140.0 lb

## 2024-03-21 DIAGNOSIS — Z0000 Encounter for general adult medical examination without abnormal findings: Secondary | ICD-10-CM | POA: Diagnosis not present

## 2024-03-21 NOTE — Patient Instructions (Signed)
 Megan Brewer , Thank you for taking time out of your busy schedule to complete your Annual Wellness Visit with me. I enjoyed our conversation and look forward to speaking with you again next year. I, as well as your care team,  appreciate your ongoing commitment to your health goals. Please review the following plan we discussed and let me know if I can assist you in the future. Your Game plan/ To Do List    Referrals: If you haven't heard from the office you've been referred to, please reach out to them at the phone provided.   Follow up Visits: We will see or speak with you next year for your Next Medicare AWV with our clinical staff 03/27/25 @ 10:50a Have you seen your provider in the last 6 months (3 months if uncontrolled diabetes)?   Clinician Recommendations:  Aim for 30 minutes of exercise or brisk walking, 6-8 glasses of water, and 5 servings of fruits and vegetables each day.       This is a list of the screenings recommended for you:  Health Maintenance  Topic Date Due   Flu Shot  03/17/2024   COVID-19 Vaccine (11 - Pfizer risk 2024-25 season) 06/23/2024*   Medicare Annual Wellness Visit  03/21/2025   Colon Cancer Screening  01/01/2027   DTaP/Tdap/Td vaccine (3 - Td or Tdap) 05/30/2030   Pneumococcal Vaccine for age over 87  Completed   DEXA scan (bone density measurement)  Completed   Hepatitis C Screening  Completed   Zoster (Shingles) Vaccine  Completed   Hepatitis B Vaccine  Aged Out   HPV Vaccine  Aged Out   Meningitis B Vaccine  Aged Out  *Topic was postponed. The date shown is not the original due date.    Advanced directives: (In Chart) A copy of your advanced directives are scanned into your chart should your provider ever need it. Advance Care Planning is important because it:  [x]  Makes sure you receive the medical care that is consistent with your values, goals, and preferences  [x]  It provides guidance to your family and loved ones and reduces their decisional  burden about whether or not they are making the right decisions based on your wishes.  Follow the link provided in your after visit summary or read over the paperwork we have mailed to you to help you started getting your Advance Directives in place. If you need assistance in completing these, please reach out to us  so that we can help you!  See attachments for Preventive Care and Fall Prevention Tips.

## 2024-03-21 NOTE — Progress Notes (Signed)
 Subjective:   Megan Brewer is a 78 y.o. who presents for a Medicare Wellness preventive visit.  As a reminder, Annual Wellness Visits don't include a physical exam, and some assessments may be limited, especially if this visit is performed virtually. We may recommend an in-person follow-up visit with your provider if needed.  Visit Complete: Virtual I connected with  Megan Brewer on 03/21/24 by a audio enabled telemedicine application and verified that I am speaking with the correct person using two identifiers.  Patient Location: Home  Provider Location: Home Office  I discussed the limitations of evaluation and management by telemedicine. The patient expressed understanding and agreed to proceed.  Vital Signs: Because this visit was a virtual/telehealth visit, some criteria may be missing or patient reported. Any vitals not documented were not able to be obtained and vitals that have been documented are patient reported.    Persons Participating in Visit: Patient.  AWV Questionnaire: No: Patient Medicare AWV questionnaire was not completed prior to this visit.  Cardiac Risk Factors include: advanced age (>35men, >57 women);hypertension     Objective:    Today's Vitals   03/21/24 1106  Weight: 140 lb (63.5 kg)  Height: 5' 4 (1.626 m)   Body mass index is 24.03 kg/m.     03/21/2024   11:13 AM 02/07/2024    9:27 PM 02/07/2024    3:03 PM 12/27/2023   10:47 AM 12/23/2023    1:31 PM 12/12/2023   11:18 AM 12/04/2023   11:56 AM  Advanced Directives  Does Patient Have a Medical Advance Directive? Yes No No No No No No  Type of Estate agent of [place];Living will        Does patient want to make changes to medical advance directive? No - Patient declined        Copy of Healthcare Power of Attorney in Chart? Yes - validated most recent copy scanned in chart (See row information)        Would patient like information on creating a medical advance  directive?     No - Patient declined      Current Medications (verified) Outpatient Encounter Medications as of 03/21/2024  Medication Sig   CALCIUM  PO Take 1 tablet by mouth in the morning and at bedtime.   Chlorpheniramine-PSE-Ibuprofen  (ADVIL  ALLERGY SINUS PO) Take 1 tablet by mouth daily.   cycloSPORINE  (RESTASIS ) 0.05 % ophthalmic emulsion Place 1 drop into both eyes 4 (four) times daily as needed. (Patient not taking: Reported on 03/03/2024)   denosumab  (PROLIA ) 60 MG/ML SOSY injection Pt to get at office.  Appt is 08/27/23   dexlansoprazole  (DEXILANT ) 60 MG capsule Take 1 capsule (60 mg total) by mouth daily.   diclofenac  Sodium (VOLTAREN ) 1 % GEL Apply 2 g topically 4 (four) times daily. (Patient taking differently: Apply 2 g topically 4 (four) times daily as needed.)   famotidine  (PEPCID ) 20 MG tablet Take 1 tablet (20 mg total) by mouth 2 (two) times daily.   ferrous sulfate  325 (65 FE) MG tablet Take 650 mg by mouth daily with breakfast.   fludrocortisone  (FLORINEF ) 0.1 MG tablet TAKE ONE TABLET BY MOUTH TWICE DAILY   GAMMAGARD 20 GM/200ML SOLN Inject 40 g into the vein every 21 ( twenty-one) days. INFUSE 40G INTRAVENOUSLY EVERY 3 WEEKS   hyoscyamine  (LEVBID ) 0.375 MG 12 hr tablet Take 1 tablet (0.375 mg total) by mouth 2 (two) times daily.   KEVZARA 200 MG/1. SOAJ Inject 1.14 mLs  into the skin every 14 (fourteen) days.   levothyroxine  (SYNTHROID ) 75 MCG tablet TAKE ONE (1) TABLET BY MOUTH EACH DAY 30MINUTES BEFORE BREAKFAST ON EMPTY STOMACH   lidocaine  4 % Place 1 patch onto the skin daily.   magnesium  oxide (MAG-OX) 400 MG tablet Take 400 mg by mouth daily.    midodrine  (PROAMATINE ) 10 MG tablet TAKE ONE TABLET BY MOUTH 3 TIMES DAILY   MYRBETRIQ  50 MG TB24 tablet Take 50 mg by mouth every evening.   naloxone  (NARCAN ) nasal spray 4 mg/0.1 mL Spray up nostril in event of opioid overdose. (Patient not taking: Reported on 03/03/2024)   potassium chloride  (KLOR-CON ) 10 MEQ tablet Take  1 tablet (10 mEq total) by mouth in the morning, at noon, in the evening, and at bedtime.   pregabalin  (LYRICA ) 50 MG capsule Take 1 capsule (50 mg total) by mouth 3 (three) times daily.   promethazine  (PHENERGAN ) 12.5 MG tablet Take 1 tablet (12.5 mg total) by mouth every 6 (six) hours as needed for nausea or vomiting.   Rimegepant Sulfate (NURTEC) 75 MG TBDP Dissolve 1 tab under the tongue daily as needed for migraines. Repeat in 2 hours if no improvement.   sucralfate  (CARAFATE ) 1 GM/10ML suspension SMARTSIG:Milliliter(s) By Mouth   Suzetrigine  (JOURNAVX ) 50 MG TABS Take 1 tablet by mouth 2 (two) times daily as needed.   tamsulosin  (FLOMAX ) 0.4 MG CAPS capsule Take 0.4 mg by mouth daily.   triamcinolone  cream (KENALOG ) 0.1 % Apply 1 application  topically 2 (two) times daily as needed (rash).   ZINC  OXIDE, TOPICAL, 10 % CREA Apply 1 application topically 2 (two) times daily as needed (rash).   Facility-Administered Encounter Medications as of 03/21/2024  Medication   [START ON 09/01/2024] denosumab  (PROLIA ) injection 60 mg    Allergies (verified) Butorphanol, Erythromycin base, Propoxyphene, Sumatriptan , Tetracycline, Ciprofloxacin, Corticosteroids, Doxycycline , Cefixime, Isometheptene-dichloral-apap, Ketorolac , Prednisone , Promethazine , and Tape   History: Past Medical History:  Diagnosis Date   Anemia    Arachnoiditis    Bipolar disorder (HCC)    Cataract    removed   Chronic post-thoracotomy pain    CKD (chronic kidney disease)    GERD (gastroesophageal reflux disease)    Hypogammaglobulinemia (HCC)    Hypotension    Hypothyroid    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HCC)    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HCC)    Low blood pressure    Lung cancer (HCC) 2011, 2014   Memory loss    Migraine    Opioid abuse (HCC)    Osteoporosis    PMR (polymyalgia rheumatica) (HCC) 03/17/2022   Presence of neurostimulator    Restless legs    Sleep apnea    Past Surgical History:   Procedure Laterality Date   ABDOMINAL HYSTERECTOMY     CARPAL TUNNEL RELEASE     CATARACT EXTRACTION Bilateral 2019   CHOLECYSTECTOMY     COLONOSCOPY  2015   [facility]   CT LUNG SCREENING  2018   DENTAL SURGERY  06/17/2021   4 teeth rmoved   DG  BONE DENSITY ([facility] HX)  2018   DIAGNOSTIC MAMMOGRAM  2019   ESOPHAGOGASTRODUODENOSCOPY (EGD) WITH PROPOFOL  N/A 09/23/2023   Procedure: ESOPHAGOGASTRODUODENOSCOPY (EGD) WITH PROPOFOL ;  Surgeon: Leigh Elspeth SQUIBB, MD;  Location: [facility];  Service: Gastroenterology;  Laterality: N/A;   GASTRIC BYPASS     LUNG CANCER SURGERY  02/2010   MULTIPLE TOOTH EXTRACTIONS     PORT A CATH REVISION Right 04/2021   PORT-A-CATH REMOVAL Left 02/07/2021  Procedure: REMOVAL PORT-A-CATH;  Surgeon: Kinsinger, Herlene Righter, MD;  Location: [facility];  Service: General;  Laterality: Left;  60 MINUTES ROOM 4   SAVORY DILATION N/A 09/23/2023   Procedure: SAVORY DILATION;  Surgeon: Leigh Elspeth SQUIBB, MD;  Location: [facility];  Service: Gastroenterology;  Laterality: N/A;   SPINAL CORD STIMULATOR IMPLANT     Family History  Problem Relation Age of Onset   Hypertension Mother    GER disease Mother    Dementia Mother    Osteoporosis Mother    Arthritis Mother    Bipolar disorder Mother    Parkinson's disease Father    Congestive Heart Failure Father    Pneumonia Father    Arthritis Father    Dementia Father    Depression Father    Asthma Sister    Depression Sister    Asthma Daughter    Bipolar disorder Brother    Breast cancer Niece 73 - 76   Ovarian cancer Niece 45 - 7   Colon cancer Neg Hx    Esophageal cancer Neg Hx    Stomach cancer Neg Hx    Rectal cancer Neg Hx    Social History   Socioeconomic History   Marital status: Married    Spouse name: Not on file   Number of children: 2   Years of education: Not on file   Highest education level: GED or equivalent  Occupational History   Occupation: retired  Tobacco Use   Smoking status: Never    Smokeless tobacco: Never  Vaping Use   Vaping status: Never Used  Substance and Sexual Activity   Alcohol use: Not Currently   Drug use: Never   Sexual activity: Not on file  Other Topics Concern   Not on file  Social History Narrative   Diet: None      Caffeine : coffee, tea, sodas less than or 1 daily.      Married, if yes what year: Yes, 1972      Do you live in a house, apartment, assisted living, condo, trailer, ect: Two story house       Pets: None      Current/Past profession: Teach      Exercise: None         Living Will: No   DNR: No   POA/HPOA: No      Functional Status:   Do you have difficulty bathing or dressing yourself? yes   Do you have difficulty preparing food or eating? yes   Do you have difficulty managing your medications? yes   Do you have difficulty managing your finances?yes   Do you have difficulty affording your medications? No      Right Handed    Lives in a two story home    Social Drivers of Health   Financial Resource Strain: Low Risk  (03/21/2024)   Overall Financial Resource Strain (CARDIA)    Difficulty of Paying Living Expenses: Not hard at all  Food Insecurity: No Food Insecurity (03/21/2024)   Hunger Vital Sign    Worried About Running Out of Food in the Last Year: Never true    Ran Out of Food in the Last Year: Never true  Transportation Needs: No Transportation Needs (03/21/2024)   PRAPARE - Administrator, Civil Service (Medical): No    Lack of Transportation (Non-Medical): No  Physical Activity: Insufficiently Active (03/21/2024)   Exercise Vital Sign    Days of Exercise per Week: 2 days  Minutes of Exercise per Session: 60 min  Stress: No Stress Concern Present (03/21/2024)   [employer] of Occupational Health - Occupational Stress Questionnaire    Feeling of Stress: Not at all  Social Connections: Socially Integrated (03/21/2024)   Social Connection and Isolation Panel    Frequency of Communication with  Friends and Family: More than three times a week    Frequency of Social Gatherings with Friends and Family: More than three times a week    Attends Religious Services: More than 4 times per year    Active Member of [employer] or Organizations: Yes    Attends Engineer, structural: More than 4 times per year    Marital Status: Married    Tobacco Counseling Counseling given: Not Answered    Clinical Intake:  Pre-visit preparation completed: Yes  Pain : No/denies pain     BMI - recorded: 24.03 Nutritional Status: BMI of 19-24  Normal Nutritional Risks: None Diabetes: No  Lab Results  Component Value Date   HGBA1C 5.3 11/30/2023     How often do you need to have someone help you when you read instructions, pamphlets, or other written materials from your doctor or pharmacy?: 1 - Never  Interpreter Needed?: No  Information entered by :: Rojelio Blush LPN   Activities of Daily Living     03/21/2024   11:11 AM 11/30/2023   12:26 AM  In your present state of health, do you have any difficulty performing the following activities:  Hearing? 1 0  Comment Wears Hearing Aids   Vision? 0 0  Difficulty concentrating or making decisions? 0 0  Walking or climbing stairs? 1   Comment Uses a Cane   Dressing or bathing? 0   Doing errands, shopping? 0 0  Preparing Food and eating ? N   Using the Toilet? N   In the past six months, have you accidently leaked urine? Y   Comment Wears Breifs. Followed by Urologist   Do you have problems with loss of bowel control? N   Managing your Medications? N   Managing your Finances? N   Housekeeping or managing your Housekeeping? N     Patient Care Team: [name], DO as PCP - General (Family Medicine) Lavona Agent, MD as PCP - Cardiology (Cardiology) Patel, Donika K, DO as Consulting Physician (Neurology)  I have updated your Care Teams any recent Medical Services you may have received from other providers in the  past year.     Assessment:   This is a routine wellness examination for Delonda.  Hearing/Vision screen Hearing Screening - Comments:: Wears Hearing Aids Vision Screening - Comments:: Wears rx glasses - up to date with routine eye exams with  [facility]   Goals Addressed               This Visit's Progress     Increase physical activity (pt-stated)        Remain Active.       Depression Screen     03/21/2024   11:10 AM 01/18/2024    1:13 PM 12/07/2023    2:09 PM 09/16/2023    2:21 PM 08/31/2023    1:23 PM 06/01/2023    1:23 PM 03/19/2023    9:48 AM  PHQ 2/9 Scores  PHQ - 2 Score 0 2 2 0 6 0 0    Fall Risk     03/21/2024   11:12 AM 01/18/2024    1:13 PM 12/23/2023  1:30 PM 12/07/2023    2:09 PM 12/07/2023    1:59 PM  Fall Risk   Falls in the past year? 1 0 0 0 0  Number falls in past yr: 0   0 0  Injury with Fall? 0   0 0  Risk for fall due to : No Fall Risks  Impaired balance/gait;Impaired mobility    Follow up Falls evaluation completed  Falls evaluation completed      MEDICARE RISK AT HOME:  Medicare Risk at Home Any stairs in or around the home?: Yes If so, are there any without handrails?: No Home free of loose throw rugs in walkways, pet beds, electrical cords, etc?: Yes Adequate lighting in your home to reduce risk of falls?: Yes Life alert?: No Use of a cane, walker or w/c?: Yes Grab bars in the bathroom?: Yes Shower chair or bench in shower?: No Elevated toilet seat or a handicapped toilet?: No  TIMED UP AND GO:  Was the test performed?  No  Cognitive Function: 6CIT completed        03/21/2024   11:13 AM 03/19/2023    9:50 AM 09/30/2021   11:05 AM 09/27/2020   10:59 AM 09/25/2019    1:08 PM  6CIT Screen  What Year? 0 points 0 points 0 points 0 points 0 points  What month? 0 points 0 points 0 points 0 points 0 points  What time? 0 points 0 points 0 points 0 points 0 points  Count back from 20 0 points 0 points 0 points 2 points 0 points  Months  in reverse 0 points 0 points 0 points 0 points 0 points  Repeat phrase 0 points 0 points 0 points 0 points 2 points  Total Score 0 points 0 points 0 points 2 points 2 points    Immunizations Immunization History  Administered Date(s) Administered   Fluad  Quad(high Dose 65+) 06/06/2019, 05/13/2020, 05/21/2021, 06/03/2022, 06/01/2023   Fluad  Trivalent(High Dose 65+) 06/01/2023   Influenza, High Dose Seasonal PF 06/06/2019, 05/13/2020, 05/21/2021   Influenza, Seasonal, Injecte, Preservative Fre 11/14/2012   Influenza,inj,Quad PF,6+ Mos 05/24/2018   Influenza-Unspecified 11/14/2012, 04/17/2016, 06/09/2017, 05/24/2018   PFIZER Comirnaty ETTERGray Top)Covid-19 Tri-Sucrose Vaccine 09/07/2019, 09/28/2019, 03/30/2020, 12/03/2020   PFIZER(Purple Top)SARS-COV-2 Vaccination 09/07/2019, 09/28/2019, 03/30/2020   Pfizer Covid-19 Vaccine Bivalent Booster 19yrs & up 05/21/2021   Pfizer(Comirnaty )Fall Seasonal Vaccine 12 years and older 06/03/2022, 06/01/2023   Pneumococcal Conjugate-13 07/29/2017   Pneumococcal Polysaccharide-23 07/29/2017, 06/10/2020   Respiratory Syncytial Virus Vaccine ,Recomb Aduvanted(Arexvy ) 06/11/2022   Td 05/30/2020   Tdap 05/30/2017   Zoster Recombinant(Shingrix ) 06/17/2017, 07/25/2021   Zoster, Unspecified 06/17/2017    Screening Tests Health Maintenance  Topic Date Due   INFLUENZA VACCINE  03/17/2024   COVID-19 Vaccine (11 - Pfizer risk 2024-25 season) 06/23/2024 (Originally 11/30/2023)   Medicare Annual Wellness (AWV)  03/21/2025   Colonoscopy  01/01/2027   DTaP/Tdap/Td (3 - Td or Tdap) 05/30/2030   Pneumococcal Vaccine: 50+ Years  Completed   DEXA SCAN  Completed   Hepatitis C Screening  Completed   Zoster Vaccines- Shingrix   Completed   Hepatitis B Vaccines  Aged Out   HPV VACCINES  Aged Out   Meningococcal B Vaccine  Aged Out    Health Maintenance  Health Maintenance Due  Topic Date Due   INFLUENZA VACCINE  03/17/2024   Health Maintenance Items  Addressed:   Vision Screening: Recommended annual ophthalmology exams for early detection of glaucoma and other disorders of the eye. Would you like  a referral to an eye doctor? No    Dental Screening: Recommended annual dental exams for proper oral hygiene  Community Resource Referral / Chronic Care Management: CRR required this visit?  No   CCM required this visit?  No   Plan:    I have personally reviewed and noted the following in the patient's chart:   Medical and social history Use of alcohol, tobacco or illicit drugs  Current medications and supplements including opioid prescriptions. Patient is not currently taking opioid prescriptions. Functional ability and status Nutritional status Physical activity Advanced directives List of other physicians Hospitalizations, surgeries, and ER visits in previous 12 months Vitals Screenings to include cognitive, depression, and falls Referrals and appointments  In addition, I have reviewed and discussed with patient certain preventive protocols, quality metrics, and best practice recommendations. A written personalized care plan for preventive services as well as general preventive health recommendations were provided to patient.   Rojelio LELON Blush, LPN   08/20/7972   After Visit Summary: (MyChart) Due to this being a telephonic visit, the after visit summary with patients personalized plan was offered to patient via MyChart   Notes: Nothing significant to report at this time.

## 2024-03-22 DIAGNOSIS — R278 Other lack of coordination: Secondary | ICD-10-CM | POA: Diagnosis not present

## 2024-03-22 DIAGNOSIS — G259 Extrapyramidal and movement disorder, unspecified: Secondary | ICD-10-CM | POA: Diagnosis not present

## 2024-03-22 DIAGNOSIS — F444 Conversion disorder with motor symptom or deficit: Secondary | ICD-10-CM | POA: Diagnosis not present

## 2024-03-22 DIAGNOSIS — R29898 Other symptoms and signs involving the musculoskeletal system: Secondary | ICD-10-CM | POA: Diagnosis not present

## 2024-03-22 DIAGNOSIS — Z789 Other specified health status: Secondary | ICD-10-CM | POA: Diagnosis not present

## 2024-03-22 DIAGNOSIS — Z7409 Other reduced mobility: Secondary | ICD-10-CM | POA: Diagnosis not present

## 2024-03-30 DIAGNOSIS — R300 Dysuria: Secondary | ICD-10-CM | POA: Diagnosis not present

## 2024-03-30 DIAGNOSIS — N179 Acute kidney failure, unspecified: Secondary | ICD-10-CM | POA: Diagnosis not present

## 2024-03-30 NOTE — Progress Notes (Signed)
 Lab showed AKI on CKD. Called the patient and left a message. She was informed to go to immediately for further evaluation.  Please contact the pt to make sure she went to ER.

## 2024-03-30 NOTE — Progress Notes (Signed)
 Subjective:    Megan Brewer is a 78 y.o. female with CKD, lower extremity lymphedema, Ig G deficiency, polyarthralgia/solu medrol  infusion, who complains of  dysuria for 2 weeks.  Patient also complains of nausea, body swelling, fatigue, weakness, soft stool.  Patient denies fever, chills, cough, chest pain, shortness of breath.   Patient does not have a history of recurrent UTI.  Patient does not have a history of pyelonephritis.  ROS Denies fever, chills, headache, body ache Denies rhinorrhea, nasal and sinus congestion, sneezing, ear pain, sore throat, watery eye, anosmia, ageusia Denies cough, dyspnea, chest pain, diaphoresis, leg swelling Denies nausea, vomiting, diarrhea, constipation, abdominal pain Denies frequency, urgency, dysuria Denies rash A complete ROS was performed with postive/negative pertinent findings listed in the HPI. All other systems are negative.     The following portions of the patient's history were reviewed and updated as appropriate: allergies, current medications, past family history, past medical history, past social history, past surgical history, and problem list.  Allergies Allergen Reactions  . Butorphanol Hives, Angioedema and Other (See Comments)    like someone is brushing my brain with a brillo pad  . Erythromycin Base Diarrhea, Itching and GI Intolerance  . Gabapentin Anaphylaxis  . Pregabalin    . Promethazine  Itching and Other (See Comments)    causes severe abdominal pain when taken IV; however she can take PO or IM  . Propoxyphene GI Intolerance and Mental Status Changes    Depression, Agitation, Blurred Vision  . Sumatriptan  GI Intolerance and Tinnitus    migraines are worse  . Tetracycline GI Intolerance, Tinnitus and Dizziness    Headache  . Cefixime Hives and Other (See Comments)    Headache  . Ciprofloxacin Hives, Other (See Comments), Tinnitus and Dizziness    Headache  . Corticosteroids (Glucocorticoids) Other (See Comments)     Oral dosing prednisone /corticosteroids causes migraines/nausea/tinnitus. Previously tolerated IV and intranasal admin w/o difficulty.  . Doxycycline  Dizziness and Other (See Comments)    unknown, unknown  . Isometh-Dichloral-Acetaminophn GI Intolerance and Other (See Comments)    Mood Lability  . Ketorolac  Other (See Comments)    12/02/2016 interview by ELAYNE: Oral dosing prednisone /corticosteroids causes migraines/nausea/tinnitus. Prev tolerated IV and intranasal admin w/o difficulty.  . Adhesive Other (See Comments)    Adhesive tapes-patient denies  . Isometheptene Other (See Comments)  . Phosphorated Carbohydrate Other (See Comments)    unknown    Current Outpatient Medications:  .  acetaminophen  (TYLENOL ) 500 mg tablet, Take 500 mg by mouth., Disp: , Rfl:  .  baclofen  (LIORESAL ) 10 mg tablet, , Disp: , Rfl:  .  denosumab  (PROLIA ) 60 mg/mL syrg syringe, Inject 60 mg under the skin., Disp: , Rfl:  .  dexamethasone  (DECADRON ) 2 mg tablet, TAKE 2 TABLETS BY MOUTH 60 MINUTES PRIORTO IVIG INFUSION, Disp: 15 tablet, Rfl: 1 .  dexlansoprazole  (Dexilant ) 60 mg DR capsule, Take 60 mg by mouth., Disp: , Rfl:  .  dicyclomine  (Bentyl ) 20 mg tab, Take 20 mg by mouth., Disp: , Rfl:  .  famotidine  (PEPCID ) 20 mg tablet, , Disp: , Rfl:  .  ferrous sulfate  325 mg (65 mg iron) tablet, Take 325 mg by mouth., Disp: , Rfl:  .  fludrocortisone  (FLORINEF ) 0.1 mg tablet, Take 0.2 mg by mouth daily., Disp: , Rfl:  .  ibuprofen  (MOTRIN ) 200 mg tablet, Take 600 mg by mouth., Disp: , Rfl:  .  immun glob G 5% IgQ 0-50 (Gammagard S-D, IgA < 1 mcg/mL,) solr infusion,  Infuse 2 g/kg into a venous catheter., Disp: , Rfl:  .  immune globulin, human, (Gammagard Liquid) 10 % infusion, INFUSE 40G INTRAVENOUSLY EVERY 3 WEEKS, Disp: 400 mL, Rfl: 11 .  Kevzara 200 mg/1.14 mL pnij subcutaneous injection, INJECT 1.14 ML (200 MG TOTAL) UNDER THE SKIN EVERY 14 (FOURTEEN) DAYS., Disp: 6.84 mL, Rfl: 0 .  levothyroxine  (SYNTHROID ) 75  mcg tablet, Take 75 mcg by mouth., Disp: , Rfl:  .  midodrine  (PROAMATINE ) 10 mg tablet, Take 10 mg by mouth., Disp: , Rfl:  .  Myrbetriq  50 mg Tb24, , Disp: , Rfl:  .  potassium chloride  (KLOR-CON ) 20 mEq ER tablet, Take  by mouth., Disp: , Rfl:  .  rho,D, immune globulin (HyperRHO S/D) 1,500 unit (300 mcg) syrg injection, Inject 300 mcg into the muscle., Disp: , Rfl:  .  rimegepant (Nurtec ODT) 75 mg disintegrating tablet, Take 1 tab under the tongue daily as needed for migraines., Disp: , Rfl:  .  sucralfate  (CARAFATE ) 100 mg/mL oral suspension, Take 1 g by mouth., Disp: , Rfl:  .  tamsulosin  (FLOMAX ) 0.4 mg cap, , Disp: , Rfl:  .  torsemide  (DEMADEX ) 20 mg tablet, Take 20 mg by mouth., Disp: , Rfl:  .  triamcinolone  acetonide (KENALOG ) 0.1 % cream, Apply topically 2 (two) times a day for 7 days., Disp: 45 g, Rfl: 0 .  Voltaren  Arthritis Pain 1 % gel, Apply 2 g topically., Disp: , Rfl:  .  zinc  oxide (Secura Protective, zinc  oxide,) 10 % crea, Apply topically., Disp: , Rfl:  .  asenapine  maleate (SAPHRIS ) 2.5 mg subl SL tablet, Place 2.5 mg under the tongue. (Patient not taking: Reported on 03/30/2024), Disp: , Rfl:  .  azelastine  (ASTELIN ) 137 mcg (0.1 %) nasal spray, , Disp: , Rfl:  .  cholecalciferol  (VITAMIN D3) 1,000 unit (25 mcg) tablet, Take 1,000 Units by mouth Once Daily. (Patient not taking: Reported on 03/30/2024), Disp: , Rfl:  .  ergocalciferol  (VITAMIN D2) 1,250 mcg (50,000 unit) capsule, Take 50,000 Units by mouth once a week. (Patient not taking: Reported on 03/30/2024), Disp: , Rfl:  .  hydrOXYzine  pamoate (VISTARIL ) 25 mg capsule, , Disp: , Rfl:  .  methylphenidate  (RITALIN ) 10 mg tablet, Take 1 tablet (10 mg total) by mouth 2 (two) times a day. Morning and mid-day (Patient not taking: Reported on 03/30/2024), Disp: 60 tablet, Rfl: 0 .  mirtazapine  (REMERON ) 30 mg tablet, , Disp: , Rfl:  .  ondansetron  (ZOFRAN -ODT) 4 mg disintegrating tablet, Take 4 mg by mouth every 8 (eight)  hours as needed for nausea. (Patient not taking: Reported on 03/30/2024), Disp: 20 tablet, Rfl: 0 .  rOPINIRole  (REQUIP ) 0.5 mg tablet, Take 0.5 mg by mouth. (Patient not taking: Reported on 03/30/2024), Disp: , Rfl:    Objective:    BP 95/64   Pulse 81   Temp 96.8 F (36 C) (Temporal)   Resp 16   Wt 65.3 kg (144 lb)   SpO2 96%   BMI 24.72 kg/m   Wt Readings from Last 3 Encounters:  03/30/24 65.3 kg (144 lb)  10/02/23 61.2 kg (135 lb)  05/11/23 66.7 kg (147 lb)    Physical Exam Constitutional:      General: She is not in acute distress. HENT:     Nose: No congestion or rhinorrhea.   Eyes:     Conjunctiva/sclera: Conjunctivae normal.    Cardiovascular:     Rate and Rhythm: Normal rate and regular rhythm.  Heart sounds: Murmur heard.  Pulmonary:     Effort: Pulmonary effort is normal.     Breath sounds: Normal breath sounds.  Abdominal:     Tenderness: There is no right CVA tenderness or left CVA tenderness.   Skin:    Coloration: Skin is not jaundiced.   Neurological:     Mental Status: She is alert.   Psychiatric:        Mood and Affect: Mood normal.      Lab (03/30/24, today) CR: 1.70 GFR: 31 estimated creatinine clearance is 24.7 mL/min (A) (by C-G formula based on SCr of 1.7 mg/dL (H)). LFT: normal   Glucose: 96 A1c:  CBC + Diff: WNL   Laboratory:  Urine dipstick: urine sample not available     Assessment:    1. Dysuria  CBC with Differential   Urine Culture   B-Type Natriuretic Peptide (BNP)   Comprehensive Metabolic Panel   CANCELED: Comprehensive Metabolic Panel    2. AKI (acute kidney injury) on CKD           Plan:     I called the patient yesterday (mobile and home phone) and left a message.  She was informed to go to ER immediately for AKI/oliguria .

## 2024-03-30 NOTE — Telephone Encounter (Signed)
 Contacted pt to see if she could come back in for one more lab, lvm for pt to come back in to get one more lab redrawn.

## 2024-03-30 NOTE — Telephone Encounter (Addendum)
 Patient called back stating she is coming for another blood drawn per your message in portal, she will be in around 6p.

## 2024-03-31 ENCOUNTER — Emergency Department (HOSPITAL_COMMUNITY)
Admission: EM | Admit: 2024-03-31 | Discharge: 2024-03-31 | Disposition: A | Discharge status: Home / Self Care | Source: Ambulatory Visit | Attending: Emergency Medicine | Admitting: Emergency Medicine

## 2024-03-31 ENCOUNTER — Other Ambulatory Visit: Payer: Self-pay

## 2024-03-31 DIAGNOSIS — N189 Chronic kidney disease, unspecified: Secondary | ICD-10-CM | POA: Diagnosis not present

## 2024-03-31 DIAGNOSIS — R799 Abnormal finding of blood chemistry, unspecified: Secondary | ICD-10-CM | POA: Insufficient documentation

## 2024-03-31 DIAGNOSIS — R300 Dysuria: Secondary | ICD-10-CM | POA: Diagnosis not present

## 2024-03-31 DIAGNOSIS — N390 Urinary tract infection, site not specified: Secondary | ICD-10-CM | POA: Diagnosis not present

## 2024-03-31 DIAGNOSIS — R600 Localized edema: Secondary | ICD-10-CM | POA: Insufficient documentation

## 2024-03-31 DIAGNOSIS — Z79899 Other long term (current) drug therapy: Secondary | ICD-10-CM | POA: Insufficient documentation

## 2024-03-31 DIAGNOSIS — Z85118 Personal history of other malignant neoplasm of bronchus and lung: Secondary | ICD-10-CM | POA: Diagnosis not present

## 2024-03-31 LAB — URINALYSIS, ROUTINE W REFLEX MICROSCOPIC
Bilirubin Urine: NEGATIVE
Glucose, UA: NEGATIVE mg/dL
Hgb urine dipstick: NEGATIVE
Ketones, ur: NEGATIVE mg/dL
Leukocytes,Ua: NEGATIVE
Nitrite: NEGATIVE
Protein, ur: NEGATIVE mg/dL
Specific Gravity, Urine: 1.003 — ABNORMAL LOW (ref 1.005–1.030)
pH: 5 (ref 5.0–8.0)

## 2024-03-31 LAB — BASIC METABOLIC PANEL WITH GFR
Anion gap: 8 (ref 5–15)
BUN: 48 mg/dL — ABNORMAL HIGH (ref 8–23)
CO2: 24 mmol/L (ref 22–32)
Calcium: 8.6 mg/dL — ABNORMAL LOW (ref 8.9–10.3)
Chloride: 107 mmol/L (ref 98–111)
Creatinine, Ser: 1.29 mg/dL — ABNORMAL HIGH (ref 0.44–1.00)
GFR, Estimated: 43 mL/min — ABNORMAL LOW (ref >60)
Glucose, Bld: 104 mg/dL — ABNORMAL HIGH (ref 70–99)
Potassium: 3.3 mmol/L — ABNORMAL LOW (ref 3.5–5.1)
Sodium: 139 mmol/L (ref 135–145)

## 2024-03-31 LAB — CBC
HCT: 37 % (ref 36.0–46.0)
Hemoglobin: 12.5 g/dL (ref 12.0–15.0)
MCH: 30.4 pg (ref 26.0–34.0)
MCHC: 33.8 g/dL (ref 30.0–36.0)
MCV: 90 fL (ref 80.0–100.0)
Platelets: 139 K/uL — ABNORMAL LOW (ref 150–400)
RBC: 4.11 MIL/uL (ref 3.87–5.11)
RDW: 13.6 % (ref 11.5–15.5)
WBC: 3.8 K/uL — ABNORMAL LOW (ref 4.0–10.5)
nRBC: 0 % (ref 0.0–0.2)

## 2024-03-31 MED ORDER — HEPARIN SOD (PORK) LOCK FLUSH 100 UNIT/ML IV SOLN
500.0000 [IU] | Freq: Once | INTRAVENOUS | Status: AC
  Administered 2024-03-31: 500 [IU]
  Filled 2024-03-31: qty 5

## 2024-03-31 NOTE — ED Triage Notes (Signed)
 Pt presents to ED from home C/O dysuria and abnormal labs (creatinine, GFR). Was sent by UC for further evaluation.

## 2024-03-31 NOTE — ED Provider Notes (Signed)
 [place] EMERGENCY DEPARTMENT AT [facility] Provider Note   CSN: [id number] Arrival date & time: 03/31/24  1422     Patient presents with: Abnormal Lab   Megan Brewer is a 78 y.o. female.   The history is provided by the patient and medical records. No language interpreter was used.  Abnormal Lab    78 year old female with multiple comorbidities which includes lung cancer, opioid abuse, bipolar disorder, prior myalgia rheumatica, CKD who was sent here from urgent care center for further evaluation of her urinary discomfort.  Patient states she is prone for having urinary tract infection and has had multiple UTIs throughout the year.  She is recently finishing a course of antibiotics believes it was Bactrim .  States despite taking antibiotics she still endorsed having burning on urination and did not feel well.  For the past several days she also noticed increasing fluid retention specifically to both of her thighs.  She went to urgent care center yesterday for evaluation and after labs was obtained patient received a phone call stating that she needs to come to the ER due to worsening kidney function.  Overall patient denies any fever or chills.  She does not endorse any chest pain or shortness of breath and no abdominal pain and denies any nausea vomiting or diarrhea.  Prior to Admission medications   Medication Sig Start Date End Date Taking? Authorizing Provider  CALCIUM  PO Take 1 tablet by mouth in the morning and at bedtime.    [provider]  Chlorpheniramine-PSE-Ibuprofen  (ADVIL  ALLERGY SINUS PO) Take 1 tablet by mouth daily.    [provider]  cycloSPORINE  (RESTASIS ) 0.05 % ophthalmic emulsion Place 1 drop into both eyes 4 (four) times daily as needed. Patient not taking: Reported on 03/03/2024    [provider]  denosumab  (PROLIA ) 60 MG/ML SOSY injection Pt to get at office.  Appt is 08/27/23 07/14/23   [name], DO   dexlansoprazole  (DEXILANT ) 60 MG capsule Take 1 capsule (60 mg total) by mouth daily. 07/26/23   [name], PA-C  diclofenac  Sodium (VOLTAREN ) 1 % GEL Apply 2 g topically 4 (four) times daily. Patient taking differently: Apply 2 g topically 4 (four) times daily as needed. 07/27/23   [name], PA-C  famotidine  (PEPCID ) 20 MG tablet Take 1 tablet (20 mg total) by mouth 2 (two) times daily. 09/20/23   [name], PA-C  ferrous sulfate  325 (65 FE) MG tablet Take 650 mg by mouth daily with breakfast.    [provider]  fludrocortisone  (FLORINEF ) 0.1 MG tablet TAKE ONE TABLET BY MOUTH TWICE DAILY 02/28/24   Wendling, Mabel Mt, DO  GAMMAGARD 20 GM/200ML SOLN Inject 40 g into the vein every 21 ( twenty-one) days. INFUSE 40G INTRAVENOUSLY EVERY 3 WEEKS 02/27/22   [provider]  hyoscyamine  (LEVBID ) 0.375 MG 12 hr tablet Take 1 tablet (0.375 mg total) by mouth 2 (two) times daily. 01/03/24   Wendling, Mabel Mt, DO  KEVZARA 200 MG/1. SOAJ Inject 1.14 mLs into the skin every 14 (fourteen) days. 09/09/22   [provider]  levothyroxine  (SYNTHROID ) 75 MCG tablet TAKE ONE (1) TABLET BY MOUTH EACH DAY BEFORE BREAKFAST ON EMPTY STOMACH 07/19/23   [name], DO  lidocaine  4 % Place 1 patch onto the skin daily. 07/08/23   [name], MD  magnesium  oxide (MAG-OX) 400 MG tablet Take 400 mg by mouth daily.     [provider]  midodrine  (PROAMATINE ) 10 MG tablet TAKE ONE TABLET BY MOUTH 3 TIMES DAILY 01/24/24   [name], DO  MYRBETRIQ  50 MG TB24 tablet Take 50 mg by mouth every evening. 10/07/22   [provider]  naloxone  (NARCAN ) nasal spray 4 mg/0.1 mL Spray up nostril in event of opioid overdose. Patient not taking: Reported on 03/03/2024 06/28/23   [name], DO  potassium chloride  (KLOR-CON ) 10 MEQ tablet Take 1 tablet (10 mEq total) by mouth in the morning, at noon, in the evening, and at  bedtime. 02/15/24   [name], DO  pregabalin  (LYRICA ) 50 MG capsule Take 1 capsule (50 mg total) by mouth 3 (three) times daily. 01/03/24   [name], DO  promethazine  (PHENERGAN ) 12.5 MG tablet Take 1 tablet (12.5 mg total) by mouth every 6 (six) hours as needed for nausea or vomiting. 01/03/24   Frann, Mabel Mt, DO  Rimegepant Sulfate (NURTEC) 75 MG TBDP Dissolve 1 tab under the tongue daily as needed for migraines. Repeat in 2 hours if no improvement. 01/03/24   [name], DO  sucralfate  (CARAFATE ) 1 GM/10ML suspension SMARTSIG:Milliliter(s) By Mouth 12/13/23   [provider]  Suzetrigine  (JOURNAVX ) 50 MG TABS Take 1 tablet by mouth 2 (two) times daily as needed. 01/18/24   [name], [name] SQUIBB, MD  tamsulosin  (FLOMAX ) 0.4 MG CAPS capsule Take 0.4 mg by mouth daily. 11/25/23   [provider]  triamcinolone  cream (KENALOG ) 0.1 % Apply 1 application  topically 2 (two) times daily as needed (rash).    [provider]  ZINC  OXIDE, TOPICAL, 10 % CREA Apply 1 application topically 2 (two) times daily as needed (rash). 02/14/21   [name], MD    Allergies: Butorphanol, Erythromycin base, Propoxyphene, Sumatriptan , Tetracycline, Ciprofloxacin, Corticosteroids, Doxycycline , Cefixime, Isometheptene-dichloral-apap, Ketorolac , Prednisone , Promethazine , and Tape    Review of Systems  All other systems reviewed and are negative.   Updated Vital Signs BP 126/69 (BP Location: Left Arm)   Pulse 75   Temp (!) 97.5 F (36.4 C) (Oral)   Resp 18   Ht 5' 4 (1.626 m)   Wt 63.5 kg   SpO2 94%   BMI 24.03 kg/m   Physical Exam Vitals and nursing note reviewed.  Constitutional:      General: She is not in acute distress.    Appearance: She is well-developed.  HENT:     Head: Atraumatic.  Eyes:     Conjunctiva/sclera: Conjunctivae normal.  Cardiovascular:     Rate and Rhythm: Normal rate and regular rhythm.     Pulses: Normal  pulses.     Heart sounds: Normal heart sounds.  Pulmonary:     Effort: Pulmonary effort is normal.  Abdominal:     Palpations: Abdomen is soft.     Tenderness: There is abdominal tenderness (Mild diffuse tenderness without focal point tenderness).  Musculoskeletal:     Cervical back: Neck supple.     Right lower leg: Edema present.     Left lower leg: Edema present.  Skin:    Findings: No rash.  Neurological:     Mental Status: She is alert. Mental status is at baseline.  Psychiatric:        Mood and Affect: Mood normal.     (all labs ordered are listed, but only abnormal results are displayed) Labs Reviewed  URINALYSIS, ROUTINE W REFLEX MICROSCOPIC - Abnormal; Notable for the following components:      Result Value   Color,  Urine COLORLESS (*)    Specific Gravity, Urine 1.003 (*)    All other components within normal limits  BASIC METABOLIC PANEL WITH GFR - Abnormal; Notable for the following components:   Potassium 3.3 (*)    Glucose, Bld 104 (*)    BUN 48 (*)    Creatinine, Ser 1.29 (*)    Calcium  8.6 (*)    GFR, Estimated 43 (*)    All other components within normal limits  CBC - Abnormal; Notable for the following components:   WBC 3.8 (*)    Platelets 139 (*)    All other components within normal limits    EKG: None  Radiology: No results found.   Procedures   Medications Ordered in the ED - No data to display                                  Medical Decision Making  BP 126/69 (BP Location: Left Arm)   Pulse 75   Temp (!) 97.5 F (36.4 C) (Oral)   Resp 18   Ht 5' 4 (1.626 m)   Wt 63.5 kg   SpO2 94%   BMI 24.03 kg/m   58:50 PM  79 year old female with multiple comorbidities which includes lung cancer, opioid abuse, bipolar disorder, prior myalgia rheumatica, CKD who was sent here from urgent care center for further evaluation of her urinary discomfort.  Patient states she is prone for having urinary tract infection and has had multiple UTIs  throughout the year.  She is recently finishing a course of antibiotics believes it was Bactrim .  States despite taking antibiotics she still endorsed having burning on urination and did not feel well.  For the past several days she also noticed increasing fluid retention specifically to both of her thighs.  She went to urgent care center yesterday for evaluation and after labs was obtained patient received a phone call stating that she needs to come to the ER due to worsening kidney function.  Overall patient denies any fever or chills.  She does not endorse any chest pain or shortness of breath and no abdominal pain and denies any nausea vomiting or diarrhea.  On exam patient is resting comfortably appears to be in no acute discomfort.  Heart with normal rate and rhythm, lungs are clear abdomen is diffusely tenderness without focal point tenderness.  She appears uncomfortable when I perform percussion on her back.  She does have trace edema to bilateral lower extremities.  Vital signs overall reassuring.  EMR reviewed, patient has labs that was obtained yesterday showing BUN of 40, creatinine of 1.7 with a GFR of 31.  BNP was normal.  -Labs ordered, independently viewed and interpreted by me.  Labs remarkable for BUN 48, Cr 1.29 which is an improvement from recent lab values.  UA without UTI -The patient was maintained on a cardiac monitor.  I personally viewed and interpreted the cardiac monitored which showed an underlying rhythm of: NSR -Imaging including abd/pelvis CT was considered and offered but pt declined. I felt CT is low yield -This patient presents to the ED for concern of dysuria, this involves an extensive number of treatment options, and is a complaint that carries with it a high risk of complications and morbidity.  The differential diagnosis includes UTI, pyelonephritis, kidney stone, colitis, diverticulitis, aki -Co morbidities that complicate the patient evaluation includes bipolar,  PMR, CKD -Treatment includes reassurance -Reevaluation  of the patient after these medicines showed that the patient stayed the same -PCP office notes or outside notes reviewed -Discussion with attending Dr. Ruthe -Escalation to admission/observation considered: patients feels much better, is comfortable with discharge, and will follow up with PCP -Prescription medication considered, patient comfortable with home medication -Social Determinant of Health considered which includes insufficient activitity        Final diagnoses:  Dysuria    ED Discharge Orders     None          Nivia Colon, PA-C 03/31/24 1711    Ruthe Cornet, DO 03/31/24 1716

## 2024-03-31 NOTE — Discharge Instructions (Signed)
 Fortunately your blood work today shows improvement of your kidney function.  Your urine did not show any signs of urinary tract infection.  Please follow-up closely with your doctor for further care.  Return if any concern.

## 2024-04-04 DIAGNOSIS — R768 Other specified abnormal immunological findings in serum: Secondary | ICD-10-CM | POA: Diagnosis not present

## 2024-04-06 ENCOUNTER — Ambulatory Visit: Admitting: Cardiology

## 2024-04-15 ENCOUNTER — Other Ambulatory Visit: Payer: Self-pay | Admitting: Student

## 2024-04-18 ENCOUNTER — Ambulatory Visit: Admitting: Physical Medicine and Rehabilitation

## 2024-04-21 ENCOUNTER — Other Ambulatory Visit: Payer: Self-pay | Admitting: Family Medicine

## 2024-04-21 DIAGNOSIS — D839 Common variable immunodeficiency, unspecified: Secondary | ICD-10-CM | POA: Diagnosis not present

## 2024-04-21 DIAGNOSIS — E039 Hypothyroidism, unspecified: Secondary | ICD-10-CM

## 2024-04-22 ENCOUNTER — Other Ambulatory Visit: Payer: Self-pay | Admitting: Physician Assistant

## 2024-04-25 DIAGNOSIS — G894 Chronic pain syndrome: Secondary | ICD-10-CM | POA: Diagnosis not present

## 2024-04-25 DIAGNOSIS — M797 Fibromyalgia: Secondary | ICD-10-CM | POA: Diagnosis not present

## 2024-04-25 DIAGNOSIS — F444 Conversion disorder with motor symptom or deficit: Secondary | ICD-10-CM | POA: Diagnosis not present

## 2024-04-25 DIAGNOSIS — Z7409 Other reduced mobility: Secondary | ICD-10-CM | POA: Diagnosis not present

## 2024-04-27 ENCOUNTER — Ambulatory Visit

## 2024-04-27 VITALS — BP 97/49 | HR 72 | Temp 97.8°F | Resp 16 | Ht 64.0 in | Wt 140.0 lb

## 2024-04-27 DIAGNOSIS — M2550 Pain in unspecified joint: Secondary | ICD-10-CM | POA: Diagnosis not present

## 2024-04-27 DIAGNOSIS — M353 Polymyalgia rheumatica: Secondary | ICD-10-CM | POA: Diagnosis not present

## 2024-04-27 MED ORDER — HEPARIN SOD (PORK) LOCK FLUSH 100 UNIT/ML IV SOLN
250.0000 [IU] | Freq: Once | INTRAVENOUS | Status: AC | PRN
  Administered 2024-04-27: 250 [IU]
  Filled 2024-04-27: qty 5

## 2024-04-27 MED ORDER — SODIUM CHLORIDE 0.9 % IV SOLN
1000.0000 mg | Freq: Once | INTRAVENOUS | Status: AC
  Administered 2024-04-27: 1000 mg via INTRAVENOUS
  Filled 2024-04-27: qty 16

## 2024-04-27 NOTE — Progress Notes (Signed)
 Diagnosis: Polyarthralgia  Provider:  Praveen Mannam MD  Procedure: IV Infusion  IV Type: Port a Cath, IV Location: R Chest  Solumedrol (Methylprednisolone ), Dose: 1000 mg  Infusion Start Time: 1357  Infusion Stop Time: 1504  Post Infusion IV Care: Port a Cath Deaccessed/Flushed  Discharge: Condition: Good, Destination: Home . AVS Provided  Performed by:  Eleanor DELENA Bloch, RN

## 2024-05-08 DIAGNOSIS — M353 Polymyalgia rheumatica: Secondary | ICD-10-CM | POA: Diagnosis not present

## 2024-05-08 DIAGNOSIS — D803 Selective deficiency of immunoglobulin G [IgG] subclasses: Secondary | ICD-10-CM | POA: Diagnosis not present

## 2024-05-08 DIAGNOSIS — Z79899 Other long term (current) drug therapy: Secondary | ICD-10-CM | POA: Diagnosis not present

## 2024-05-08 DIAGNOSIS — M797 Fibromyalgia: Secondary | ICD-10-CM | POA: Diagnosis not present

## 2024-05-11 ENCOUNTER — Emergency Department (HOSPITAL_COMMUNITY)
Admission: EM | Admit: 2024-05-11 | Discharge: 2024-05-11 | Disposition: A | Discharge status: Home / Self Care | Attending: Emergency Medicine | Admitting: Emergency Medicine

## 2024-05-11 ENCOUNTER — Other Ambulatory Visit: Payer: Self-pay

## 2024-05-11 ENCOUNTER — Emergency Department (HOSPITAL_COMMUNITY)

## 2024-05-11 ENCOUNTER — Encounter (HOSPITAL_COMMUNITY): Payer: Self-pay

## 2024-05-11 DIAGNOSIS — S161XXA Strain of muscle, fascia and tendon at neck level, initial encounter: Secondary | ICD-10-CM | POA: Diagnosis not present

## 2024-05-11 DIAGNOSIS — I700 Atherosclerosis of aorta: Secondary | ICD-10-CM | POA: Insufficient documentation

## 2024-05-11 DIAGNOSIS — Y92810 Car as the place of occurrence of the external cause: Secondary | ICD-10-CM | POA: Insufficient documentation

## 2024-05-11 DIAGNOSIS — M797 Fibromyalgia: Secondary | ICD-10-CM | POA: Insufficient documentation

## 2024-05-11 DIAGNOSIS — M4802 Spinal stenosis, cervical region: Secondary | ICD-10-CM | POA: Insufficient documentation

## 2024-05-11 DIAGNOSIS — M47812 Spondylosis without myelopathy or radiculopathy, cervical region: Secondary | ICD-10-CM | POA: Insufficient documentation

## 2024-05-11 DIAGNOSIS — R0781 Pleurodynia: Secondary | ICD-10-CM | POA: Insufficient documentation

## 2024-05-11 DIAGNOSIS — Z9049 Acquired absence of other specified parts of digestive tract: Secondary | ICD-10-CM | POA: Insufficient documentation

## 2024-05-11 DIAGNOSIS — R531 Weakness: Secondary | ICD-10-CM | POA: Diagnosis not present

## 2024-05-11 DIAGNOSIS — Z9682 Presence of neurostimulator: Secondary | ICD-10-CM | POA: Diagnosis not present

## 2024-05-11 DIAGNOSIS — S199XXA Unspecified injury of neck, initial encounter: Secondary | ICD-10-CM | POA: Diagnosis present

## 2024-05-11 DIAGNOSIS — R7989 Other specified abnormal findings of blood chemistry: Secondary | ICD-10-CM | POA: Insufficient documentation

## 2024-05-11 DIAGNOSIS — M546 Pain in thoracic spine: Secondary | ICD-10-CM | POA: Diagnosis not present

## 2024-05-11 DIAGNOSIS — S3993XA Unspecified injury of pelvis, initial encounter: Secondary | ICD-10-CM | POA: Diagnosis not present

## 2024-05-11 DIAGNOSIS — S3991XA Unspecified injury of abdomen, initial encounter: Secondary | ICD-10-CM | POA: Diagnosis not present

## 2024-05-11 DIAGNOSIS — S0990XA Unspecified injury of head, initial encounter: Secondary | ICD-10-CM | POA: Diagnosis not present

## 2024-05-11 DIAGNOSIS — X58XXXA Exposure to other specified factors, initial encounter: Secondary | ICD-10-CM | POA: Insufficient documentation

## 2024-05-11 DIAGNOSIS — M545 Low back pain, unspecified: Secondary | ICD-10-CM | POA: Diagnosis not present

## 2024-05-11 DIAGNOSIS — J9859 Other diseases of mediastinum, not elsewhere classified: Secondary | ICD-10-CM | POA: Diagnosis not present

## 2024-05-11 DIAGNOSIS — S299XXA Unspecified injury of thorax, initial encounter: Secondary | ICD-10-CM | POA: Diagnosis not present

## 2024-05-11 LAB — BASIC METABOLIC PANEL WITH GFR
Anion gap: 13 (ref 5–15)
BUN: 35 mg/dL — ABNORMAL HIGH (ref 8–23)
CO2: 22 mmol/L (ref 22–32)
Calcium: 9.5 mg/dL (ref 8.9–10.3)
Chloride: 105 mmol/L (ref 98–111)
Creatinine, Ser: 1.09 mg/dL — ABNORMAL HIGH (ref 0.44–1.00)
GFR, Estimated: 53 mL/min — ABNORMAL LOW (ref >60)
Glucose, Bld: 96 mg/dL (ref 70–99)
Potassium: 3.4 mmol/L — ABNORMAL LOW (ref 3.5–5.1)
Sodium: 141 mmol/L (ref 135–145)

## 2024-05-11 LAB — CBC WITH DIFFERENTIAL/PLATELET
Abs Immature Granulocytes: 0.02 K/uL (ref 0.00–0.07)
Basophils Absolute: 0.1 K/uL (ref 0.0–0.1)
Basophils Relative: 1 %
Eosinophils Absolute: 0.1 K/uL (ref 0.0–0.5)
Eosinophils Relative: 2 %
HCT: 41.4 % (ref 36.0–46.0)
Hemoglobin: 13.1 g/dL (ref 12.0–15.0)
Immature Granulocytes: 0 %
Lymphocytes Relative: 22 %
Lymphs Abs: 1.3 K/uL (ref 0.7–4.0)
MCH: 29.2 pg (ref 26.0–34.0)
MCHC: 31.6 g/dL (ref 30.0–36.0)
MCV: 92.2 fL (ref 80.0–100.0)
Monocytes Absolute: 0.5 K/uL (ref 0.1–1.0)
Monocytes Relative: 8 %
Neutro Abs: 4 K/uL (ref 1.7–7.7)
Neutrophils Relative %: 67 %
Platelets: 162 K/uL (ref 150–400)
RBC: 4.49 MIL/uL (ref 3.87–5.11)
RDW: 13.7 % (ref 11.5–15.5)
WBC: 5.9 K/uL (ref 4.0–10.5)
nRBC: 0 % (ref 0.0–0.2)

## 2024-05-11 MED ORDER — OXYCODONE HCL 5 MG PO TABS: 5.0000 mg | ORAL_TABLET | Freq: Four times a day (QID) | ORAL | 0 refills | Status: DC | PRN

## 2024-05-11 MED ORDER — IOHEXOL 300 MG/ML  SOLN
100.0000 mL | Freq: Once | INTRAMUSCULAR | Status: AC | PRN
  Administered 2024-05-11: 100 mL via INTRAVENOUS

## 2024-05-11 MED ORDER — HYDROMORPHONE HCL 1 MG/ML IJ SOLN
1.0000 mg | Freq: Once | INTRAMUSCULAR | Status: AC
  Administered 2024-05-11: 1 mg via INTRAVENOUS
  Filled 2024-05-11: qty 1

## 2024-05-11 NOTE — ED Provider Notes (Signed)
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 strain of neck muscle, initial encounter  Muscle pain, fibromyalgia    ED Discharge Orders     None          Towana Ozell BROCKS, MD 05/11/24 1729
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
## 2024-05-11 NOTE — ED Provider Notes (Signed)
 5:26 PM Assumed care of patient from off-going team. For more details, please see note from same day.  In brief, this is a 78 y.o. female with h/o fibromyalgia who presents w/ full body pain.  Plan/Dispo at time of sign-out & ED Course since sign-out: [ ]  pan scan  BP (!) 116/58   Pulse 77   Temp (!) 97.4 F (36.3 C) (Oral)   Resp 18   Ht 5' 4 (1.626 m)   Wt 65.8 kg   SpO2 96%   BMI 24.89 kg/m    ED Course:   Clinical Course as of 05/11/24 1726  Thu May 11, 2024  1249 Patient feels a little bit better after initial round of pain medicine. [MB]  1643 CT Head Wo Contrast 1. No acute brain injury. 2. No acute cervical spine injury. 3. Mild spondylosis of the cervical spine with moderate disc disease at the C6-7 level. Minimal left-sided neural foraminal narrowing at the C4-5, C5-6 and C6-7 levels.   [HN]  1644 CT CHEST ABDOMEN PELVIS W CONTRAST 1. No acute posttraumatic changes demonstrated in the chest, abdomen, or pelvis. 2. Postoperative changes of previous right thoracotomy and right partial lung resection. 3. Right 6/7 intercostal hernia with soft tissue and fat herniating from the subcutaneous chest wall into the pleural space. This is progressed since prior study but is most likely postoperative. 4. Nondistended gas-filled esophagus with stomach extending into the right side of the mediastinum. This likely indicates previous partial esophagectomy with gastric pull-through procedure. If this does not reflect the surgical history, then this could also indicate a hiatal hernia with reflux or dysmotility changes in the esophagus. No change since prior studies. 5. Aortic atherosclerosis. 6. Cholecystectomy with intra and extrahepatic bile duct dilatation, likely postoperative. 7. No evidence of bowel obstruction or inflammation. 8. No acute bony abnormalities.   [HN]  1644 CT L-SPINE NO CHARGE 1. Normal alignment of the thoracic and lumbar spine. No acute  displaced fractures are identified. 2. Mild degenerative changes in the thoracolumbar spine. 3. Spinal stimulator with lead tips in the midthoracic region.   [HN]  1724  patient reevaluated.  She states she feels improved.  Likely fibromyalgia flare or other musculoskeletal pain.  She states that the pain medicine she received is beginning to wear off but that she would like to be discharged and manage at home.  She does follow with a PMNR physician but does not have an appointment with him for the next several weeks.  Patient is advised to take Tylenol  1000 mg every 8 hours at home and can supplement for breakthrough pain with oxycodone  every 4-6 hours.  Discussed risk of this medication and discussed taking MiraLAX  to help prevent constipation.  Patient reports understanding. [HN]    Clinical Course User Index [HN] Franklyn Sid SAILOR, MD [MB] Towana Ozell BROCKS, MD    Dispo: DC w/ discharge instructions/return precautions. All questions answered to patient's satisfaction.   ------------------------------- Sid Franklyn, MD Emergency Medicine  This note was created using dictation software, which may contain spelling or grammatical errors.   Franklyn Sid SAILOR, MD 05/11/24 1726

## 2024-05-11 NOTE — Discharge Instructions (Addendum)
 Thank you for coming to [facility] Emergency Department. You were seen for allover pain and likely fibromyalgia flare.  Please take Tylenol  1000 mg every 8 hours.  For breakthrough pain you can take oxycodone  5 mg every 6 hours as needed.  Please do not drive while taking this medication and be aware that it can make you very drowsy and increased risk for falls.  Please take MiraLAX  1-2 capfuls per day as long as you are taking the oxycodone  to help event constipation.  Please follow-up with your pain doctor within the next 1 to 2 weeks to discuss further treatment.  Do not hesitate to return to the ED or call 911 if you experience: -Worsening symptoms -Lightheadedness, passing out -Fevers/chills -Anything else that concerns you

## 2024-05-15 ENCOUNTER — Other Ambulatory Visit: Payer: Self-pay | Admitting: Student

## 2024-05-19 ENCOUNTER — Other Ambulatory Visit: Payer: Self-pay | Admitting: Student

## 2024-05-22 ENCOUNTER — Other Ambulatory Visit: Payer: Self-pay | Admitting: Physical Medicine and Rehabilitation

## 2024-05-22 ENCOUNTER — Encounter: Payer: Self-pay | Admitting: Physical Medicine and Rehabilitation

## 2024-05-22 ENCOUNTER — Encounter: Attending: Physical Medicine and Rehabilitation | Admitting: Physical Medicine and Rehabilitation

## 2024-05-22 VITALS — BP 97/53 | HR 87 | Ht 64.0 in | Wt 145.0 lb

## 2024-05-22 DIAGNOSIS — G894 Chronic pain syndrome: Secondary | ICD-10-CM | POA: Insufficient documentation

## 2024-05-22 DIAGNOSIS — R5383 Other fatigue: Secondary | ICD-10-CM | POA: Insufficient documentation

## 2024-05-22 DIAGNOSIS — M353 Polymyalgia rheumatica: Secondary | ICD-10-CM | POA: Diagnosis not present

## 2024-05-22 MED ORDER — JOURNAVX 50 MG PO TABS: 1.0000 | ORAL_TABLET | Freq: Two times a day (BID) | ORAL | 0 refills | Status: DC | PRN

## 2024-05-22 MED ORDER — METHYLPHENIDATE HCL 10 MG PO TABS: 10.0000 mg | ORAL_TABLET | Freq: Every day | ORAL | 0 refills | Status: AC | PRN
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
## 2024-05-23 ENCOUNTER — Other Ambulatory Visit: Payer: Self-pay

## 2024-05-23 ENCOUNTER — Other Ambulatory Visit (HOSPITAL_BASED_OUTPATIENT_CLINIC_OR_DEPARTMENT_OTHER): Payer: Self-pay

## 2024-05-23 ENCOUNTER — Telehealth: Payer: Self-pay

## 2024-05-23 MED ORDER — COVID-19 MRNA VAC-TRIS(PFIZER) 30 MCG/0.3ML IM SUSY
0.3000 mL | PREFILLED_SYRINGE | Freq: Once | INTRAMUSCULAR | 0 refills | Status: DC
Start: 2024-05-23 — End: 2024-05-23

## 2024-05-23 MED ORDER — COVID-19 MRNA VAC-TRIS(PFIZER) 30 MCG/0.3ML IM SUSY
0.3000 mL | PREFILLED_SYRINGE | Freq: Once | INTRAMUSCULAR | 0 refills | Status: AC
  Filled 2024-05-23: qty 0.3, 1d supply, fill #0

## 2024-05-23 NOTE — Telephone Encounter (Signed)
 Called pt and Covid vaccine prescription was sent.

## 2024-05-23 NOTE — Telephone Encounter (Signed)
 Copied from CRM [id number]. Topic: Clinical - Request for Lab/Test Order >> May 23, 2024  8:32 AM Berneda FALCON wrote: Reason for CRM: Pt states she would like the COVID Vaccine. She would like to get the new one that came out, but she does not know the name of it. She would like to go to the pharmacy downstairs in the same building.  Patient callback [phone]

## 2024-05-25 ENCOUNTER — Telehealth: Payer: Self-pay | Admitting: Family Medicine

## 2024-05-25 NOTE — Telephone Encounter (Unsigned)
 Copied from CRM [id number]. Topic: Clinical - Medication Refill >> May 25, 2024  3:54 PM Alfonso HERO wrote: Medication:  Torsemide  20 mg Oral 2 times daily  Has the patient contacted their pharmacy? No (Agent: If no, request that the patient contact the pharmacy for the refill. If patient does not wish to contact the pharmacy document the reason why and proceed with request.) (Agent: If yes, when and what did the pharmacy advise?)  This is the patient's preferred pharmacy:   Patient wants to pick up Rx and take it her pharmacy of choice. Please give her a call.  Is this the correct pharmacy for this prescription? Yes If no, delete pharmacy and type the correct one.   Has the prescription been filled recently? Yes  Is the patient out of the medication? Yes  Has the patient been seen for an appointment in the last year OR does the patient have an upcoming appointment? Yes  Can we respond through MyChart? Yes  Agent: Please be advised that Rx refills may take up to 3 business days. We ask that you follow-up with your pharmacy.

## 2024-05-26 ENCOUNTER — Telehealth: Payer: Self-pay | Admitting: Family Medicine

## 2024-05-26 NOTE — Telephone Encounter (Signed)
**Note De-identified  [place] Obfuscation** Please advise 

## 2024-05-26 NOTE — Telephone Encounter (Signed)
 Patient requesting Torsemide , not on profile

## 2024-05-26 NOTE — Telephone Encounter (Signed)
 Copied from CRM [id number]. Topic: Clinical - Medication Refill >> May 26, 2024  1:30 PM Deaijah H wrote: Medication: Torsemide  20 mg Oral 2 times daily  Has the patient contacted their pharmacy? Yes (Agent: If no, request that the patient contact the pharmacy for the refill. If patient does not wish to contact the pharmacy document the reason why and proceed with request.) Been trying  (Agent: If yes, when and what did the pharmacy advise?)  This is the patient's preferred pharmacy:  [facility] - [place], [place] - [address] [place] [address] Phone: [phone] Fax: [phone]   Is this the correct pharmacy for this prescription? Yes If no, delete pharmacy and type the correct one.   Has the prescription been filled recently? No  Is the patient out of the medication? Yes  Has the patient been seen for an appointment in the last year OR does the patient have an upcoming appointment? Yes  Can we respond through MyChart? Yes  Agent: Please be advised that Rx refills may take up to 3 business days. We ask that you follow-up with your pharmacy.

## 2024-05-27 MED ORDER — TORSEMIDE 20 MG PO TABS: 20.0000 mg | ORAL_TABLET | Freq: Every day | ORAL | 3 refills | Status: DC

## 2024-05-29 ENCOUNTER — Other Ambulatory Visit: Payer: Self-pay | Admitting: Family Medicine

## 2024-05-29 MED ORDER — PREGABALIN 50 MG PO CAPS: 50.0000 mg | ORAL_CAPSULE | Freq: Three times a day (TID) | ORAL | 5 refills | Status: AC

## 2024-05-29 NOTE — Addendum Note (Signed)
 Addended by: FRANN MABEL SQUIBB on: 05/29/2024 11:46 AM   Modules accepted: Orders

## 2024-05-30 ENCOUNTER — Ambulatory Visit: Admitting: Family Medicine

## 2024-05-31 ENCOUNTER — Telehealth: Payer: Self-pay | Admitting: Physical Medicine and Rehabilitation

## 2024-05-31 NOTE — Telephone Encounter (Signed)
 P was calling bc she said raulkar was supposed to send in a new prescription of journavx  at her last appt

## 2024-06-05 ENCOUNTER — Encounter: Payer: Self-pay | Admitting: Family Medicine

## 2024-06-05 ENCOUNTER — Ambulatory Visit: Admitting: Family Medicine

## 2024-06-05 VITALS — BP 105/68 | HR 59 | Temp 98.0°F | Resp 16 | Ht 64.0 in | Wt 145.0 lb

## 2024-06-05 DIAGNOSIS — M797 Fibromyalgia: Secondary | ICD-10-CM

## 2024-06-05 DIAGNOSIS — M353 Polymyalgia rheumatica: Secondary | ICD-10-CM

## 2024-06-05 MED ORDER — METHYLPREDNISOLONE ACETATE 40 MG/ML IJ SUSP
40.0000 mg | Freq: Once | INTRAMUSCULAR | Status: AC
  Administered 2024-06-05: 40 mg via INTRAMUSCULAR

## 2024-06-05 MED ORDER — OXYCODONE HCL 10 MG PO TABA: 0.5000 | ORAL_TABLET | Freq: Four times a day (QID) | ORAL | 0 refills | Status: AC | PRN
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
## 2024-06-05 NOTE — Patient Instructions (Addendum)
 Please let me know if you cannot get a hold of Dr. Lennette team in the next couple days.  Ice/cold pack over area for 10-15 min twice daily.  Heat (pad or rice pillow in microwave) over affected area, 10-15 minutes twice daily.   OK to take Tylenol  1000 mg (2 extra strength tabs) or 975 mg (3 regular strength tabs) every 6 hours as needed.  Let us  know if you need anything.

## 2024-06-07 ENCOUNTER — Telehealth: Payer: Self-pay

## 2024-06-07 NOTE — Telephone Encounter (Signed)
 Patient calling into the office regarding rx for Journavx .  Patient states she is under the impression that the journavx  would be double the strength and that our office was sending over the rx the day of the office visit.  Uncertain of what the patient was trying to explain without upsetting the patient any further, patient advised I would message Provider and see if we could figure out some additional information regarding the rx for journavx .  Patient verbalized understanding.

## 2024-06-08 ENCOUNTER — Ambulatory Visit

## 2024-06-08 ENCOUNTER — Encounter: Admitting: Physical Medicine and Rehabilitation

## 2024-06-08 VITALS — BP 106/71 | HR 64 | Temp 97.6°F | Resp 14 | Ht 64.0 in | Wt 149.8 lb

## 2024-06-08 DIAGNOSIS — M2550 Pain in unspecified joint: Secondary | ICD-10-CM

## 2024-06-08 DIAGNOSIS — M353 Polymyalgia rheumatica: Secondary | ICD-10-CM

## 2024-06-08 MED ORDER — SODIUM CHLORIDE 0.9 % IV SOLN
1000.0000 mg | Freq: Once | INTRAVENOUS | Status: AC
  Administered 2024-06-08: 1000 mg via INTRAVENOUS
  Filled 2024-06-08: qty 16

## 2024-06-08 MED ORDER — JOURNAVX 50 MG PO TABS: 1.0000 | ORAL_TABLET | Freq: Two times a day (BID) | ORAL | 0 refills | Status: AC | PRN

## 2024-06-08 MED ORDER — HEPARIN SOD (PORK) LOCK FLUSH 100 UNIT/ML IV SOLN
500.0000 [IU] | Freq: Once | INTRAVENOUS | Status: AC | PRN
  Administered 2024-06-08: 500 [IU]
  Filled 2024-06-08: qty 5
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
## 2024-06-08 NOTE — Progress Notes (Signed)
 Diagnosis: Polyarthralgia  Provider:  Praveen Mannam MD  Procedure: IV Infusion  IV Type: Port a Cath, IV Location: R Chest  Solumedrol (Methylprednisolone ), Dose: 1000 mg  Infusion Start Time: 1353  Infusion Stop Time: 1455  Post Infusion IV Care: Peripheral IV Discontinued  Discharge: Condition: Good, Destination: Home . AVS Provided  Performed by:  Camila Norville, RN

## 2024-06-13 DIAGNOSIS — M797 Fibromyalgia: Secondary | ICD-10-CM | POA: Diagnosis not present

## 2024-06-13 DIAGNOSIS — F444 Conversion disorder with motor symptom or deficit: Secondary | ICD-10-CM | POA: Diagnosis not present

## 2024-06-13 DIAGNOSIS — G894 Chronic pain syndrome: Secondary | ICD-10-CM | POA: Diagnosis not present

## 2024-06-14 ENCOUNTER — Other Ambulatory Visit: Payer: Self-pay | Admitting: Family Medicine

## 2024-06-14 DIAGNOSIS — I951 Orthostatic hypotension: Secondary | ICD-10-CM

## 2024-06-14 DIAGNOSIS — G250 Essential tremor: Secondary | ICD-10-CM | POA: Diagnosis not present

## 2024-06-14 DIAGNOSIS — G259 Extrapyramidal and movement disorder, unspecified: Secondary | ICD-10-CM | POA: Diagnosis not present

## 2024-06-14 DIAGNOSIS — M353 Polymyalgia rheumatica: Secondary | ICD-10-CM | POA: Diagnosis not present

## 2024-06-19 DIAGNOSIS — R300 Dysuria: Secondary | ICD-10-CM | POA: Diagnosis not present

## 2024-06-19 DIAGNOSIS — N3000 Acute cystitis without hematuria: Secondary | ICD-10-CM | POA: Diagnosis not present

## 2024-07-02 NOTE — Progress Notes (Deleted)
 Cardiology Office Note    Date:  07/02/2024  ID:  Megan Brewer, Megan Brewer 1946-02-06, MRN [id number] PCP:  [name], DO  Cardiologist:  Lynwood Schilling, MD  Electrophysiologist:  None   Chief Complaint: ***  History of Present Illness: .    Megan Brewer is a 78 y.o. female with visit-pertinent history of orthostatic hypotension, palpitations, bacteremia, left bundle branch block, OSA, hypothyroidism, CKD stage IIIb, lung cancer s/p lobectomy and chemotherapy, IgG deficiency, obesity s/p gastric bypass.  Patient was evaluated by Dr. Revankar in August 2020 after multiple visits to the ED for chest pain.  She underwent stress testing which was normal and low risk.  She was first seen by Dr. Schilling on 01/15/2021 for evaluation of elevated troponin during a hospital mission for sepsis.  She underwent echo for suspected bacteremia which showed normal RV/RV function and mild to moderate MR/TR/AI.  Elevated troponin thought be related demand ischemia in setting of sepsis and outpatient stress testing was planned.  On follow-up she developed C. difficile and stress test was postponed.  In August 2022 she had an ED visit for weakness and collapse with associated chest pain.  Troponin was negative and EKG without ischemic changes.  She underwent nuclear stress testing in September 2022 which was normal.  In 01/2023 she was seen in the ED for hypotension, started on midodrine .  At the time of her office visit it was noted that she was in a wheelchair.  She had received IVIG the day prior to visit.  Blood pressure was 86/56 and she was sent to the ED for further evaluation 1 seen on 02/09/2023.  She received fluid resuscitation with improvement in her symptoms.  On follow-up with her PCP the following day she was again hypotensive and sent back to the ED.  BP was normal in the emergency department.  Florinef  was increased to 0.2 mg daily.  Echocardiogram on 06/08/2023 indicated LVEF 60 to  65%, no RWMA, G1 DD, RV systolic function and size was normal, no evidence of mitral valve regurgitation or stenosis, aortic valve regurgitation was not visualized, no stenosis was present.  Patient was last seen in clinic on 03/03/2024 by Dr. Schilling following again an ED visit, high presented with dehydration it was noted that while there her troponin is elevated at 319, they had initially sent her home without a troponin trend then called her back to get blood work drawn, redraw was 472 but she did not want to stay in the hospital.  At her follow-up visit she reported she had not been having any chest pressure, neck or arm discomfort.  She denied any shortness of breath it was noted that she was hypotensive and very weak when she came to the office but reported this was the way she always felt intermittently.  Her blood pressure improved during visit.  Note that she did have a urinary tract infection and had just been prescribed antibiotics.  Given elevated troponin nuclear stress test was ordered.  Stress test on 03/15/2024 was normal and low risk, LV perfusion was normal with no evidence of ischemia or infarction.  Coronary calcium  was absent on attenuation correction CT imaging.  Today she presents for follow up. She reports that she   Hypotension:  Mitral regurgitation:   Labwork independently reviewed:   ROS: .   *** denies chest pain, shortness of breath, lower extremity edema, fatigue, palpitations, melena, hematuria, hemoptysis, diaphoresis, weakness, presyncope, syncope, orthopnea, and PND.  All other  systems are reviewed and otherwise negative.  Studies Reviewed: SABRA    EKG:  EKG is ordered today, personally reviewed, demonstrating ***     CV Studies: Cardiac studies reviewed are outlined and summarized above. Otherwise please see EMR for full report. Cardiac Studies & Procedures    ______________________________________________________________________________________________   STRESS TESTS  MYOCARDIAL PERFUSION IMAGING 03/15/2024  Interpretation Summary   The study is normal. The study is low risk.   LV perfusion is normal. There is no evidence of ischemia. There is no evidence of infarction.   Left ventricular function is normal. Nuclear stress EF: 92%. The left ventricular ejection fraction is hyperdynamic (>65%). End diastolic cavity size is normal. End systolic cavity size is normal.   Coronary calcium  was absent on the attenuation correction CT images.   Electronically signed by Lonni Nanas, MD   ECHOCARDIOGRAM  ECHOCARDIOGRAM COMPLETE 06/08/2023  Narrative ECHOCARDIOGRAM REPORT    Patient Name:   Megan Brewer Date of Exam: 06/08/2023 Medical Rec #:  [id number]          Height:       64.0 in Accession #:    [id number]         Weight:       143.0 lb Date of Birth:  Feb 08, 1946         BSA:          1.696 m Patient Age:    76 years           BP:           87/55 mmHg Patient Gender: F                  HR:           81 bpm. Exam Location:  [place]  Procedure: 2D Echo, Cardiac Doppler and Color Doppler  Indications:    R06.9 DOE  History:        Patient has prior history of Echocardiogram examinations, most recent 02/05/2021. Lung cancer, CKD, Arrythmias:LBBB, Signs/Symptoms:Dyspnea and Dizziness/Lightheadedness; Risk Factors:Sleep Apnea, Non-Smoker and Hypertension.  Sonographer:    Alan Greenhouse RDMS, RVT, RDCS Referring Phys: [id number] [facility]   Sonographer Comments: Suboptimal parasternal window and Technically difficult study due to poor echo windows. Image acquisition challenging due to patient body habitus. IMPRESSIONS   1. Left ventricular ejection fraction, by estimation, is 60 to 65%. The left ventricle has normal function. The left ventricle has no regional wall motion abnormalities. Left ventricular  diastolic parameters are consistent with Grade I diastolic dysfunction (impaired relaxation). 2. Right ventricular systolic function is normal. The right ventricular size is normal. 3. The mitral valve is normal in structure. No evidence of mitral valve regurgitation. No evidence of mitral stenosis. 4. The aortic valve is normal in structure. Aortic valve regurgitation is not visualized. No aortic stenosis is present. 5. The inferior vena cava is normal in size with greater than 50% respiratory variability, suggesting right atrial pressure of 3 mmHg.  Comparison(s): Echocardiogram done 02/05/21 showed an EF of 60-65%.  FINDINGS Left Ventricle: Left ventricular ejection fraction, by estimation, is 60 to 65%. The left ventricle has normal function. The left ventricle has no regional wall motion abnormalities. The left ventricular internal cavity size was normal in size. There is no left ventricular hypertrophy. Left ventricular diastolic parameters are consistent with Grade I diastolic dysfunction (impaired relaxation).  Right Ventricle: The right ventricular size is normal. No increase in right ventricular wall thickness. Right ventricular systolic function is  normal.  Left Atrium: Left atrial size was normal in size.  Right Atrium: Right atrial size was normal in size.  Pericardium: There is no evidence of pericardial effusion.  Mitral Valve: The mitral valve is normal in structure. No evidence of mitral valve regurgitation. No evidence of mitral valve stenosis.  Tricuspid Valve: The tricuspid valve is normal in structure. Tricuspid valve regurgitation is not demonstrated. No evidence of tricuspid stenosis.  Aortic Valve: The aortic valve is normal in structure. Aortic valve regurgitation is not visualized. No aortic stenosis is present. Aortic valve mean gradient measures 10.0 mmHg. Aortic valve peak gradient measures 14.5 mmHg. Aortic valve area, by VTI measures 2.17 cm.  Pulmonic  Valve: The pulmonic valve was normal in structure. Pulmonic valve regurgitation is not visualized. No evidence of pulmonic stenosis.  Aorta: The aortic root is normal in size and structure.  Venous: The inferior vena cava is normal in size with greater than 50% respiratory variability, suggesting right atrial pressure of 3 mmHg.  IAS/Shunts: No atrial level shunt detected by color flow Doppler.   LEFT VENTRICLE PLAX 2D LVIDd:         1.40 cm     Diastology LVIDs:         0.60 cm     LV e' medial:    7.07 cm/s LV PW:         1.00 cm     LV E/e' medial:  10.5 LV IVS:        0.50 cm     LV e' lateral:   7.07 cm/s LVOT diam:     1.70 cm     LV E/e' lateral: 10.5 LV SV:         80 LV SV Index:   47 LVOT Area:     2.27 cm  LV Volumes (MOD) LV vol d, MOD A2C: 22.7 ml LV vol d, MOD A4C: 17.4 ml LV vol s, MOD A2C: 10.3 ml LV vol s, MOD A4C: 5.9 ml LV SV MOD A2C:     12.4 ml LV SV MOD A4C:     17.4 ml LV SV MOD BP:      12.3 ml  RIGHT VENTRICLE RV S prime:     8.38 cm/s TAPSE (M-mode): 1.5 cm  LEFT ATRIUM             Index        RIGHT ATRIUM          Index LA diam:        2.10 cm 1.24 cm/m   RA Area:     4.93 cm LA Vol (A2C):   25.4 ml 14.97 ml/m  RA Volume:   5.81 ml  3.42 ml/m LA Vol (A4C):   25.3 ml 14.91 ml/m LA Biplane Vol: 25.9 ml 15.27 ml/m AORTIC VALVE AV Area (Vmax):    2.21 cm AV Area (Vmean):   1.99 cm AV Area (VTI):     2.17 cm AV Vmax:           190.67 cm/s AV Vmean:          153.000 cm/s AV VTI:            0.370 m AV Peak Grad:      14.5 mmHg AV Mean Grad:      10.0 mmHg LVOT Vmax:         186.00 cm/s LVOT Vmean:        134.000 cm/s LVOT VTI:  0.354 m LVOT/AV VTI ratio: 0.96  AORTA Ao Root diam: 2.30 cm  MITRAL VALVE                TRICUSPID VALVE MV Area (PHT): 2.68 cm     TR Peak grad:   16.3 mmHg MV Decel Time: 283 msec     TR Vmax:        202.00 cm/s MR Peak grad: 96.0 mmHg MR Vmax:      490.00 cm/s   SHUNTS MV E velocity: 74.10  cm/s   Systemic VTI:  0.35 m MV A velocity: 111.00 cm/s  Systemic Diam: 1.70 cm MV E/A ratio:  0.67  Jennifer Crape MD Electronically signed by Jennifer Crape MD Signature Date/Time: 06/09/2023/9:22:10 AM    Final          ______________________________________________________________________________________________       Current Reported Medications:.    No outpatient medications have been marked as taking for the 07/04/24 encounter (Appointment) with Athziry Millican D, NP.   Current Facility-Administered Medications for the 07/04/24 encounter (Appointment) with Kaliann Coryell D, NP  Medication   [START ON 09/01/2024] denosumab  (PROLIA ) injection 60 mg    Physical Exam:    VS:  There were no vitals taken for this visit.   Wt Readings from Last 3 Encounters:  06/08/24 149 lb 12.8 oz (67.9 kg)  06/05/24 145 lb (65.8 kg)  05/22/24 145 lb (65.8 kg)    GEN: Well nourished, well developed in no acute distress NECK: No JVD; No carotid bruits CARDIAC: ***RRR, no murmurs, rubs, gallops RESPIRATORY:  Clear to auscultation without rales, wheezing or rhonchi  ABDOMEN: Soft, non-tender, non-distended EXTREMITIES:  No edema; No acute deformity     Asessement and Plan:.     ***     Disposition: F/u with ***  Signed, Jenniferlynn Saad D Lorea Kupfer, NP

## 2024-07-04 ENCOUNTER — Ambulatory Visit: Attending: Cardiology | Admitting: Cardiology

## 2024-07-04 DIAGNOSIS — I959 Hypotension, unspecified: Secondary | ICD-10-CM

## 2024-07-10 ENCOUNTER — Other Ambulatory Visit: Payer: Self-pay | Admitting: Family Medicine

## 2024-07-10 DIAGNOSIS — I959 Hypotension, unspecified: Secondary | ICD-10-CM

## 2024-07-20 ENCOUNTER — Other Ambulatory Visit: Payer: Self-pay | Admitting: Physician Assistant

## 2024-07-20 ENCOUNTER — Ambulatory Visit

## 2024-07-20 VITALS — BP 99/61 | HR 67 | Temp 98.4°F | Resp 16 | Ht 64.0 in | Wt 149.0 lb

## 2024-07-20 DIAGNOSIS — M353 Polymyalgia rheumatica: Secondary | ICD-10-CM | POA: Diagnosis not present

## 2024-07-20 DIAGNOSIS — M2550 Pain in unspecified joint: Secondary | ICD-10-CM | POA: Diagnosis not present

## 2024-07-20 MED ORDER — HEPARIN SOD (PORK) LOCK FLUSH 100 UNIT/ML IV SOLN
500.0000 [IU] | Freq: Once | INTRAVENOUS | Status: AC | PRN
  Administered 2024-07-20: 500 [IU]
  Filled 2024-07-20: qty 5

## 2024-07-20 MED ORDER — SODIUM CHLORIDE 0.9 % IV SOLN
1000.0000 mg | Freq: Once | INTRAVENOUS | Status: AC
  Administered 2024-07-20: 1000 mg via INTRAVENOUS
  Filled 2024-07-20: qty 16

## 2024-07-20 NOTE — Progress Notes (Signed)
 Diagnosis: Polymyalgia  Provider:  Lonna Coder MD  Procedure: IV Infusion  IV Type: Port a Cath, IV Location: R Chest  Solumedrol (Methylprednisolone ), Dose: 1000 mg  Infusion Start Time: 1402  Infusion Stop Time: 1501  Post Infusion IV Care: Port a Cath Deaccessed/Flushed  Discharge: Condition: Good, Destination: Home . AVS Provided  Performed by:  Donny Childes, RN

## 2024-07-21 ENCOUNTER — Telehealth: Payer: Self-pay | Admitting: Family Medicine

## 2024-07-21 MED ORDER — TORSEMIDE 20 MG PO TABS: 20.0000 mg | ORAL_TABLET | Freq: Two times a day (BID) | ORAL | 1 refills | Status: AC

## 2024-07-21 NOTE — Telephone Encounter (Signed)
 Copied from CRM #[id number]. Topic: Clinical - Prescription Issue >> Jul 21, 2024  8:39 AM Megan Brewer wrote: Reason for CRM: Patient is currently taking torsemide  (DEMADEX ) 20 MG tablet and prescription is written for 1 tablet (20 mg total) by mouth daily but should be 2 tablet (20 mg total) by mouth daily. Per patient she already discuss issue with Dr. Frann that she has been taking 2 daily for multiple years and provider agreed to make adjustments and send new script to the pharmacy but has not.

## 2024-07-21 NOTE — Telephone Encounter (Signed)
Rx updated and sent.

## 2024-07-26 ENCOUNTER — Other Ambulatory Visit (HOSPITAL_BASED_OUTPATIENT_CLINIC_OR_DEPARTMENT_OTHER): Payer: Self-pay

## 2024-07-26 MED ORDER — COMIRNATY 30 MCG/0.3ML IM SUSY
0.3000 mL | PREFILLED_SYRINGE | Freq: Once | INTRAMUSCULAR | 0 refills | Status: AC
  Filled 2024-07-26: qty 0.3, 1d supply, fill #0

## 2024-07-26 MED ORDER — FLUZONE HIGH-DOSE 0.5 ML IM SUSY
0.5000 mL | PREFILLED_SYRINGE | Freq: Once | INTRAMUSCULAR | 0 refills | Status: AC
  Filled 2024-07-26: qty 0.5, 1d supply, fill #0

## 2024-08-07 ENCOUNTER — Other Ambulatory Visit: Payer: Self-pay | Admitting: Physician Assistant

## 2024-08-07 ENCOUNTER — Other Ambulatory Visit: Payer: Self-pay | Admitting: Family Medicine

## 2024-08-07 DIAGNOSIS — I959 Hypotension, unspecified: Secondary | ICD-10-CM

## 2024-08-14 ENCOUNTER — Other Ambulatory Visit: Payer: Self-pay

## 2024-08-15 ENCOUNTER — Other Ambulatory Visit: Payer: Self-pay

## 2024-08-16 ENCOUNTER — Other Ambulatory Visit: Payer: Self-pay

## 2024-08-16 ENCOUNTER — Telehealth: Payer: Self-pay

## 2024-08-16 ENCOUNTER — Other Ambulatory Visit: Payer: Self-pay | Admitting: Family Medicine

## 2024-08-16 ENCOUNTER — Other Ambulatory Visit (HOSPITAL_COMMUNITY): Payer: Self-pay

## 2024-08-16 DIAGNOSIS — M810 Age-related osteoporosis without current pathological fracture: Secondary | ICD-10-CM

## 2024-08-16 NOTE — Telephone Encounter (Signed)
 Pharmacy Patient Advocate Encounter   Received notification from CoverMyMeds that prior authorization for Trulance  3MG  tablets  is required/requested.   Insurance verification completed.   The patient is insured through [employer].   Renewal on file for prior authorization already in effect.   Effective dates: 05-17-2024 to 08-16-2025

## 2024-08-18 ENCOUNTER — Other Ambulatory Visit (HOSPITAL_COMMUNITY): Payer: Self-pay

## 2024-08-18 ENCOUNTER — Telehealth: Payer: Self-pay

## 2024-08-18 ENCOUNTER — Other Ambulatory Visit: Payer: Self-pay

## 2024-08-18 MED ORDER — PROLIA 60 MG/ML ~~LOC~~ SOSY
60.0000 mg | PREFILLED_SYRINGE | Freq: Once | SUBCUTANEOUS | 0 refills | Status: AC
  Filled 2024-08-28: qty 1, 1d supply, fill #0
  Filled 2024-08-28: qty 1, 180d supply, fill #0

## 2024-08-18 NOTE — Telephone Encounter (Signed)
 Prolia  VOB initiated via [email or web address]  Next Prolia  inj DUE: 09/06/24   PHARMACY COPAY: $35

## 2024-08-19 ENCOUNTER — Other Ambulatory Visit: Payer: Self-pay | Admitting: Family Medicine

## 2024-08-21 ENCOUNTER — Other Ambulatory Visit: Payer: Self-pay

## 2024-08-21 NOTE — Progress Notes (Addendum)
 Benefits inv complete. Copay is $64

## 2024-08-22 ENCOUNTER — Encounter: Attending: Physical Medicine and Rehabilitation | Admitting: Physical Medicine and Rehabilitation

## 2024-08-22 ENCOUNTER — Ambulatory Visit: Payer: Self-pay

## 2024-08-22 VITALS — HR 93 | Ht 64.0 in | Wt 146.0 lb

## 2024-08-22 DIAGNOSIS — M810 Age-related osteoporosis without current pathological fracture: Secondary | ICD-10-CM | POA: Insufficient documentation

## 2024-08-22 DIAGNOSIS — M25552 Pain in left hip: Secondary | ICD-10-CM | POA: Diagnosis not present

## 2024-08-22 MED ORDER — LIDOCAINE 5 % EX PTCH: 1.0000 | MEDICATED_PATCH | CUTANEOUS | 0 refills | Status: AC

## 2024-08-22 NOTE — Telephone Encounter (Signed)
 " FYI Only or Action Required?: Action required by provider: request for appointment. Decline for ER  Patient was last seen in primary care on 06/05/2024 by [name], DO.  Called Nurse Triage reporting Abdominal Pain.  Symptoms began several weeks ago.  Interventions attempted: Other: nothing reported .  Symptoms are: gradually worsening.  Triage Disposition: Go to ED Now (Notify PCP)  Patient/caregiver understands and will follow disposition?: No, wishes to speak with PCP            Reason for Disposition  [1] Pain lasts > 10 minutes AND [2] age > 38 AND [3] associated chest, arm, neck, upper back or jaw pain  Answer Assessment - Initial Assessment Questions Patient reports Arthritis in left hip becoming a problem, interfering with ability to walk . Medication for restless leges often times not enough comes and spells but would like to increase the dose to 1 or 2 extra per day when restless legs is flarring up. Abdominal pain previously got  little blue pill this medication was stopped doesn't know why , doesn't know if same issues the area affected has increased and intensity has increased, sitting pain is 8/10 , when does stabbing shooting pain under ribs left side , its 101/0 , this pain has increased and now going down left side. Had upper endoscopy about 1 year ago and they covered that area had liver study.   Currently no abdominal pain. When has the abdominal pain is intermittent left upper quadrant, but since returned the pain is more frequent and more intense and expanding. Currently no pain in abdomen had it two days ago. 2 weeks ago pain was constant.  In wheelchair most the time. When pain flares lasting longer than it used to and shoots up into left rib cage. When pain shoot into left ribs difficulty breathing takes my breath  patient advised to seek ER by this RN and patient declined to go and requesting appointment to be seen for this. Calling [facility]   now.         1. LOCATION: Where does it hurt?      Left upper abdominal and shooting into left ribs  2. RADIATION: Does the pain shoot anywhere else? (e.g., chest, back)     Yes shoots into left ribs  3. ONSET: When did the pain begin? (e.g., minutes, hours or days ago)      2 weeks ago was constant , and now back 2 days ago , become more frequent and worsening pain intensity  4. SUDDEN: Gradual or sudden onset?     Comes and goes  5. PATTERN Does the pain come and go, or is it constant?     Comes and goes  6. SEVERITY: How bad is the pain?  (e.g., Scale 1-10; mild, moderate, or severe)     Severe when happens 8/10 and 10/10 at times  7. RECURRENT SYMPTOM: Have you ever had this type of stomach pain before? If Yes, ask: When was the last time? and What happened that time?      This is first  time reported 2 weeks ago   9. CARDIAC SYMPTOMS: Do you have any of the following symptoms: chest pain, difficulty breathing, sweating, nausea?     Difficulty breathing when the pain goes into left ribs  10. OTHER SYMPTOMS: Do you have any other symptoms? (e.g., back pain, diarrhea, fever, urination pain, vomiting)       Pain shoots into the left ribs now was  just in the upper left abdominal .   Patient denies the following difficulty breathing now  Protocols used: Abdominal Pain Megan Brewer: Resolved Open  Priority: Routine Created on: 08/22/2024 08:12 AM By: Clemetine Ole PARAS   Primary Information  Source  Megan Brewer (Patient)   Subject  Megan Brewer (Patient)   Topic  Clinical - Red Word Triage    Communication  Red Word that prompted transfer to Nurse Triage: arthritic hip - difficulty to walk.. script requip  isn't working - thats the generic.SABRA abdominal pain getting worse ( frequency, intensity, and the amount of area ) - was on a script for it doesn't know why ( blue pill she states )   "

## 2024-08-22 NOTE — Progress Notes (Signed)
 "  Subjective:    Patient ID: Megan Brewer, female    DOB: 02-14-1946, 79 y.o.   MRN: [id number]  HPI  1) Diffuse pain, has been attributed to polymyalgia rheumatica -says she really needs help with her left hip -up until July the pain has been very severe -she was told she has osteoarthritis -sometimes the pain drops her to her knees -she feels the sensation that the joint collapses PRIOR HISTORY: -he had a bad flare up, she had to use the Journavx , she called the pharmacy to get a refill and they said a refill was called on Megan Brewer is a 79 year old woman who presents to establish care for diffuse pain.  -cold makes her pain worse sometimes, sometimes this makes it easier for her to tolerate -she has never tried Journavx , it helps a little -steroid injection in shoulder from sports medicine really helped -it is present in her wrists, clavicles, left hip -the prednisone  helps to control the pain but it only lasts 6 weeks and by the times she could go for the second infusion it was bad.  -she will be seeing her rheumatologist in a few weeks and would be very grateful if he were able to increased the frequency to q6 weeks -has been pretty good  -The Iv prednisone  helped a great deal for some time.  -she has f/u with rheumatology -they have tried high doses of prednisone , low does hydrocodone , low doses Tramadol , lidocaine  patches. Nothing has been helpful yet -she thinks the pain started after she spent 1 month at [facility] for sepsis and C diff. She left the hospital on June 1st. On June 4th she started with breathing difficulties, shaking, and some pain. It gradually got worse.  -her husband accompanies her today -steroids did not help leg pain -interested in cervical traction device -she asked her rheumatologist if she can gradually reduce the amount of steroid- they are talking about the IV steroids, she does not take any oral steroids -discussed that changes in weather  exacerbate her pain  -she asks about results of food sensitivity testing  2) Shortness of breath -breathing without pain for the first time! -had been present July 2022 to July 2023 -she also feels a deflated rubber tire on her chest -she has been told her heart and lungs are fine -the steroids may have helped her to breath better -at the hospital where she was diagnosed she got a massive IV dose of steroids and this cleared up her lungs  3) Muscle spasms: -she has tried flexeril  in the past with benefit -she recently tried robaxin  and this caused severe symptomatic hypotension requiring her to go to the ED  4) Hypotension: -fluids help -occurs suddenly -she is taking the midodrine  and florinef  -she takes midodrine  at 7am/7pm  5) Fluid retention: -she is on the flomax  for this   6) Movement disorder -was sent to neurologist at [facility]  7) family stress -husband has been limiting in walking due to need for removal of spinal mass -she has a visiting angel who has been helpful to her in the interim  8) Fatigue: -would like like to try a stimulant  9) Osteoporosis: -she asks to see a specialist for this condition    Pain Inventory Average Pain  10 Pain Right Now 10 My pain is intermittent, constant, sharp, burning, stabbing, and aching  In the last 24 hours, has pain interfered with the following? General activity 10 Relation with others 8  Enjoyment of life 9 What TIME of day is your pain at its worst? morning , daytime, evening, and night Sleep (in general) Poor  Pain is worse with: unsure, walking, bending, sitting, inactivity, standing, some activities Pain improves with: therapy, medication and injections Relief from Meds: good    New pt    Family History  Problem Relation Age of Onset   Hypertension Mother    GER disease Mother    Dementia Mother    Osteoporosis Mother    Arthritis Mother    Bipolar disorder Mother    Parkinson's disease  Father    Congestive Heart Failure Father    Pneumonia Father    Arthritis Father    Dementia Father    Depression Father    Asthma Sister    Depression Sister    Asthma Daughter    Bipolar disorder Brother    Breast cancer Niece 65 - 66   Ovarian cancer Niece 42 - 32   Colon cancer Neg Hx    Esophageal cancer Neg Hx    Stomach cancer Neg Hx    Rectal cancer Neg Hx    Social History   Socioeconomic History   Marital status: Married    Spouse name: Not on file   Number of children: 2   Years of education: Not on file   Highest education level: GED or equivalent  Occupational History   Occupation: retired  Tobacco Use   Smoking status: Never   Smokeless tobacco: Never  Vaping Use   Vaping status: Never Used  Substance and Sexual Activity   Alcohol use: Not Currently   Drug use: Never   Sexual activity: Not on file  Other Topics Concern   Not on file  Social History Narrative   Diet: None      Caffeine : coffee, tea, sodas less than or 1 daily.      Married, if yes what year: Yes, 1972      Do you live in a house, apartment, assisted living, condo, trailer, ect: Two story house       Pets: None      Current/Past profession: Teach      Exercise: None         Living Will: No   DNR: No   POA/HPOA: No      Functional Status:   Do you have difficulty bathing or dressing yourself? yes   Do you have difficulty preparing food or eating? yes   Do you have difficulty managing your medications? yes   Do you have difficulty managing your finances?yes   Do you have difficulty affording your medications? No      Right Handed    Lives in a two story home    Social Drivers of Health   Tobacco Use: Low Risk (06/19/2024)   Received from [facility]   Patient History    Smoking Tobacco Use: Never    Smokeless Tobacco Use: Never    Passive Exposure: Not on file  Financial Resource Strain: Low Risk (03/21/2024)   Overall Financial Resource Strain (CARDIA)     Difficulty of Paying Living Expenses: Not hard at all  Food Insecurity: No Food Insecurity (03/21/2024)   Epic    Worried About Radiation Protection Practitioner of Food in the Last Year: Never true    Ran Out of Food in the Last Year: Never true  Transportation Needs: No Transportation Needs (03/21/2024)   Epic    Lack of Transportation (Medical): No  Lack of Transportation (Non-Medical): No  Physical Activity: Insufficiently Active (03/21/2024)   Exercise Vital Sign    Days of Exercise per Week: 2 days    Minutes of Exercise per Session: 60 min  Stress: No Stress Concern Present (03/21/2024)   [employer] of Occupational Health - Occupational Stress Questionnaire    Feeling of Stress: Not at all  Social Connections: Socially Integrated (03/21/2024)   Social Connection and Isolation Panel    Frequency of Communication with Friends and Family: More than three times a week    Frequency of Social Gatherings with Friends and Family: More than three times a week    Attends Religious Services: More than 4 times per year    Active Member of Clubs or Organizations: Yes    Attends Banker Meetings: More than 4 times per year    Marital Status: Married  Depression (PHQ2-9): Low Risk (06/05/2024)   Depression (PHQ2-9)    PHQ-2 Score: 0  Alcohol Screen: Low Risk (03/21/2024)   Alcohol Screen    Last Alcohol Screening Score (AUDIT): 0  Housing: Unknown (03/21/2024)   Epic    Unable to Pay for Housing in the Last Year: No    Number of Times Moved in the Last Year: Not on file    Homeless in the Last Year: No  Utilities: Not At Risk (03/21/2024)   Epic    Threatened with loss of utilities: No  Health Literacy: Adequate Health Literacy (03/21/2024)   B1300 Health Literacy    Frequency of need for help with medical instructions: Never   Past Surgical History:  Procedure Laterality Date   ABDOMINAL HYSTERECTOMY     CARPAL TUNNEL RELEASE     CATARACT EXTRACTION Bilateral 2019   CHOLECYSTECTOMY      COLONOSCOPY  2015   [facility]   CT LUNG SCREENING  2018   DENTAL SURGERY  06/17/2021   4 teeth rmoved   DG  BONE DENSITY ([facility] HX)  2018   DIAGNOSTIC MAMMOGRAM  2019   ESOPHAGOGASTRODUODENOSCOPY (EGD) WITH PROPOFOL  N/A 09/23/2023   Procedure: ESOPHAGOGASTRODUODENOSCOPY (EGD) WITH PROPOFOL ;  Surgeon: Leigh Elspeth SQUIBB, MD;  Location: [facility];  Service: Gastroenterology;  Laterality: N/A;   GASTRIC BYPASS     LUNG CANCER SURGERY  02/2010   MULTIPLE TOOTH EXTRACTIONS     PORT A CATH REVISION Right 04/2021   PORT-A-CATH REMOVAL Left 02/07/2021   Procedure: REMOVAL PORT-A-CATH;  Surgeon: Kinsinger, Herlene Righter, MD;  Location: [facility];  Service: General;  Laterality: Left;  60 MINUTES ROOM 4   SAVORY DILATION N/A 09/23/2023   Procedure: SAVORY DILATION;  Surgeon: Leigh Elspeth SQUIBB, MD;  Location: [facility];  Service: Gastroenterology;  Laterality: N/A;   SPINAL CORD STIMULATOR IMPLANT     Past Medical History:  Diagnosis Date   Anemia    Arachnoiditis    Bipolar disorder (HCC)    Cataract    removed   Chronic post-thoracotomy pain    CKD (chronic kidney disease)    GERD (gastroesophageal reflux disease)    Hypogammaglobulinemia    Hypotension    Hypothyroid    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HCC)    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HCC)    Low blood pressure    Lung cancer (HCC) 2011, 2014   Memory loss    Migraine    Opioid abuse (HCC)    Osteoporosis    PMR (polymyalgia rheumatica) 03/17/2022   Presence of neurostimulator    Restless legs    Sleep  apnea    Pulse 93   Ht 5' 4 (1.626 m)   Wt 146 lb (66.2 kg)   SpO2 97%   BMI 25.06 kg/m   Opioid Risk Score:   Fall Risk Score:  `1  Depression screen PHQ 2/9     06/05/2024    2:18 PM 05/22/2024   10:30 AM 03/21/2024   11:10 AM 01/18/2024    1:13 PM 12/07/2023    2:09 PM 09/16/2023    2:21 PM 08/31/2023    1:23 PM  Depression screen PHQ 2/9  Decreased Interest 0 0 0 1 1 0 3  Down, Depressed, Hopeless 0 0 0 1 1 0 3   PHQ - 2 Score 0 0 0 2 2 0 6  Altered sleeping 0        Tired, decreased energy 0        Change in appetite 0        Feeling bad or failure about yourself  0        Trouble concentrating 0        Moving slowly or fidgety/restless 0        Suicidal thoughts 0        PHQ-9 Score 0         Difficult doing work/chores Not difficult at all           Data saved with a previous flowsheet row definition      Review of Systems  Respiratory:  Positive for shortness of breath.   Gastrointestinal:  Positive for constipation and nausea.  Musculoskeletal:  Positive for gait problem.       Spasms  Neurological:  Positive for dizziness, tremors and weakness.  Psychiatric/Behavioral:  Positive for dysphoric mood. The patient is nervous/anxious.   All other systems reviewed and are negative.      Objective:   Physical Exam Gen: no distress, normal appearing, does appear to be in distress HEENT: oral mucosa pink and moist, NCAT Cardio: Reg rate Chest: normal effort, normal rate of breathing Abd: soft, non-distended Ext: no edema Psych: pleasant, normal affect Skin: intact Neuro: Alert and oriented x3 MSK: in power wheelchair, TTP bilateral shoulders, low back, and left hip with pain at attempted range of motion at these joints Assessment & Plan:   1) Chronic Pain Syndrome  with diffuse pain secondary to Polymylagia rheumatica -Discussed current symptoms of pain and history of pain.   -discussed that she takes Goodies and Tylenol  prn  -discussed her recent flare, that she is having difficulty breathing due to her pain, discussed that I will send a new dose of Journavx  for her and will leave a coupon for her up front  -discussed prn steroids for flare but that these orally do a number on her  -discussed that Journavx  is a highly selective inhibitor for Nav 1.8, which is specific for pain in the peripheral nervous system, discussed that lidocaine  in contrast affects all Nav receptors,  discussed that patient can try using this medication as prn for pain, though its studies have focused on its use for acute pain. Discussed that it has been studied against opioids for acute pain with comparable efficacy. Discussed that I have not seen any patient side effects thus far. Discussed that we have samples available and we have copay cards available. Discussed that outpatient if the medication requires a prior auth the copay should be $30 for at least a 60 day supply. The medication may be more likely to be  in stock in [facility] and [facility]. We do have samples available. Discussed trying medication twice per day when pain is severe  Discussed that the steroid injections are not helping as much as previously  -discussed improvement with steroid injection from sports medicine -discussed that steroids have been helpful but she is discussing with her rheumatologist about tapering these -Discussed benefits of exercise in reducing pain. -discussed results of food sensitivity testing, discussed that highest response was to cheese mold>pork>milk and recommended 3 months avoidance of these foods to see if symptoms improve -discussed that she was not breast fed as a child, she was born via a vaginal delivery -discussed that she was given a lot of antibiotics as a child.  -discussed that the only thing that has been helpful was IV steroids. Continue, recommended discussing with rheumatology increasing frequency to q6 weeks.  -discontinue cymbalta  20mg  daily.  [facility], discussed that biking will help to increase her leg muscle strength and to minimize the risk of falls.  -discussed that steroids are helping -continue ergocalciferol  50,000U  -will send message to her care team (Dr. Frann PCP, Dr. Lavona cardiologist, Dr. Redell Ness rheumatologist) to discuss doing IV steroid treatment.  -will call her rheumatologist today to discuss Kevzara and Plaquenil.   -discussed that her rheumatologist has ordered the IV steroids for her and this has been working pretty well.  -Discussed following foods that may reduce pain: 1) Ginger (especially studied for arthritis)- reduce leukotriene production to decrease inflammation 2) Blueberries- high in phytonutrients that decrease inflammation 3) Salmon- marine omega-3s reduce joint swelling and pain 4) Pumpkin seeds- reduce inflammation 5) dark chocolate- reduces inflammation 6) turmeric- reduces inflammation 7) tart cherries - reduce pain and stiffness 8) extra virgin olive oil - its compound olecanthal helps to block prostaglandins  9) chili peppers- can be eaten or applied topically via capsaicin 10) mint- helpful for headache, muscle aches, joint pain, and itching 11) garlic- reduces inflammation  Link to further information on diet for chronic pain: [email or web address]    2) Shortness of breath secondary to lung cancer -discussed that it started one year ago -discussed that cardio pulmonary workup has been normal -discussed that her IV steroids were very helpful.  -ordered pulmonary rehab  3) Muscle spasms: -prescribed robaxin   4) Cervicalgia: -.Prescribed Zynex Nexwave, cervical traction device -discussed trigger point injections   5) Movement disorder: -referred to [facility] Disorders clinic -discussed that she cannot do MRIs.   6) Muscle spasms: -fled/c flexeril  -discussed her negative response to robaxin   7) Hypotension: -increase midodrine  to 10mg  TID, continue this dose -continue fludrocortisone  -discussed abdominal binder and teds -discussed that steroids can help -discussed that flomax  can cause this, switch to night -encouraged adequate hydration  8) CKD: Cr monitored and Cr is stable.   9) Iron deficiency: -discussed dietary sources of iron such as red meat, green leafy vegetables,  chickpeas  10) Impaired balance: -discussed her plan to start PT -discussed that she is walking more than she was, discussed that she walked to the kitchen to get water -discussed that she has a visiting angel every day- discussed within an hour of her showing up she felt like they were sisters and they have a connection with each. Discussed that she goes to the grocery store for them  11) Movement disorder: -discussed that she is beng followed by neurology   12) IBS Discussed results of food sensitivity testing, discussed that highest response was to  cheese mold>pork>milk and recommended 3 months avoidance of these foods to see if symptoms improve  13) Fatigue: -ritalin  prescribed at 10mg  daily prn, discussed that it does not take much to wear her out, discussed that she went to see a psychiatrist -discussed that she was told to stop taking Vitamin D , but she is not sure why -discussed that recent vitamin D  from 5/25 was 114 which is above normal range so I agree she should not take Vitamin D   14) Osteoporosis: -referred to osteoporosis clinic at [facility]  15) Recent UTI: -discussed that she is recovering from this -encouraged a lot of fruits and vegetables  -discussed that she had to visit the ED due to the UTIs  16) Left hip pain: -discussed that Journavx   -discussed that she has DEXA scans every 2 years -reviewed last DEXA scan results with her and discussed that they are consistent with osteoporosis -advised to follow-up with OB/GYN for HRT- discussed that I have other patients on this who have also found benefit for their pain -provided Journavx  samples and provided education regarding their use: 5 boxes with 5 tablets each given. LOT #[id number], exp date 7/26 -cryorthosis brace ordered for left hip instability  17. Bilateral shoulder instability and pain: -bilateral shoulder cryo-orthosis braces ordered  18. Low back instability: -cryo-orthosis brace ordered  "

## 2024-08-22 NOTE — Telephone Encounter (Signed)
 Pt has appt scheduled for 08/23/24

## 2024-08-22 NOTE — Telephone Encounter (Signed)
 PAS attempted to connect patient to nurse triage and technical issues occurred. RN made 1st attempt to call patient back with confirmed phone # from PAS.    Copied from CRM #[id number]. Topic: Clinical - Red Word Triage >> Aug 22, 2024  8:12 AM Megan Brewer wrote: [facility] that prompted transfer to Nurse Triage: arthritic hip - difficulty to walk.. script requip  isn't working - thats the generic.Megan Brewer abdominal pain getting worse ( frequency, intensity, and the amount of area ) - was on a script for it doesn't know why ( blue pill she states )

## 2024-08-22 NOTE — Telephone Encounter (Signed)
 Reached husband Alm-- patient is on the other line currently and will call back when finished

## 2024-08-23 ENCOUNTER — Other Ambulatory Visit (HOSPITAL_COMMUNITY): Payer: Self-pay

## 2024-08-23 ENCOUNTER — Encounter: Payer: Self-pay | Admitting: Family Medicine

## 2024-08-23 ENCOUNTER — Ambulatory Visit: Admitting: Family Medicine

## 2024-08-23 VITALS — BP 110/68 | HR 83 | Temp 98.0°F | Resp 16 | Ht 64.0 in | Wt 147.0 lb

## 2024-08-23 DIAGNOSIS — R1013 Epigastric pain: Secondary | ICD-10-CM

## 2024-08-23 DIAGNOSIS — I959 Hypotension, unspecified: Secondary | ICD-10-CM | POA: Diagnosis not present

## 2024-08-23 DIAGNOSIS — K219 Gastro-esophageal reflux disease without esophagitis: Secondary | ICD-10-CM

## 2024-08-23 DIAGNOSIS — T50905A Adverse effect of unspecified drugs, medicaments and biological substances, initial encounter: Secondary | ICD-10-CM | POA: Diagnosis not present

## 2024-08-23 MED ORDER — MIDODRINE HCL 10 MG PO TABS: 5.0000 mg | ORAL_TABLET | Freq: Three times a day (TID) | ORAL | Status: AC

## 2024-08-23 MED ORDER — VOQUEZNA 10 MG PO TABS: 10.0000 mg | ORAL_TABLET | Freq: Every day | ORAL | 1 refills | Status: AC

## 2024-08-23 NOTE — Telephone Encounter (Signed)
" ° °  [employer] cannot give estimate, following Medicare Advantage guideline (20% coinsurance, 20% admin fee)   "

## 2024-08-23 NOTE — Progress Notes (Signed)
 Chief Complaint  Patient presents with   Abdominal Pain    Abdominal Pain    Megan Brewer is here for L sided abdominal pain.  Duration: several months Happens in the AM as she sleeps. This is not exertional. Nighttime awakenings? No Bleeding? No Weight loss? No Palliation: carafate  sometimes, sometimes eating Provocation: none Associated symptoms: nausea and reflux s/s's Denies: fever and bowel changes Treatment to date: Pepcid , Dexilant , Carafate  (does eventually work)  Patient has a history of borderline low blood pressure.  She has been taking midodrine  10 mg 3 times daily.  Over the past several months, she will have near daily episodes of headaches and a flushed feeling.  No chest pain or shortness of breath.  Diet and exercise have been unchanged.  She will check her blood pressure and will be in the 200s systolic and low 899d diastolic.  No extra stress/anxiety than usual.  Past Medical History:  Diagnosis Date   Anemia    Arachnoiditis    Bipolar disorder (HCC)    Cataract    removed   Chronic post-thoracotomy pain    CKD (chronic kidney disease)    GERD (gastroesophageal reflux disease)    Hypogammaglobulinemia    Hypotension    Hypothyroid    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HCC)    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HCC)    Low blood pressure    Lung cancer (HCC) 2011, 2014   Memory loss    Migraine    Opioid abuse (HCC)    Osteoporosis    PMR (polymyalgia rheumatica) 03/17/2022   Presence of neurostimulator    Restless legs    Sleep apnea     BP 110/68 (BP Location: Left Arm, Patient Position: Sitting)   Pulse 83   Temp 98 F (36.7 C) (Oral)   Resp 16   Ht 5' 4 (1.626 m)   Wt 147 lb (66.7 kg)   SpO2 96%   BMI 25.23 kg/m  Gen.: Awake, alert, appears stated age HEENT: Mucous membranes moist without mucosal lesions Heart: Regular rate and rhythm without murmurs Lungs: Clear auscultation bilaterally, no rales or wheezing, normal effort without  accessory muscle use. MSK: TTP over the left lateral lower rib cage; no crepitus, deformity, edema, or excessive warmth.  No overlying skin changes. Abdomen: Bowel sounds are present. Abdomen is soft, nontender, nondistended, no masses or organomegaly. Negative Murphy's, Rovsing's, McBurney's, and Carnett's sign. Psych: Age appropriate judgment and insight. Normal mood and affect.  Dyspepsia  Gastroesophageal reflux disease, unspecified whether esophagitis present  Adverse effect of drug, initial encounter  Hypotension, unspecified hypotension type - Plan: midodrine  (PROAMATINE ) 10 MG tablet  1/2.  FDgard recommended.  Trial Voquenza, we will see if insurance will cover it.  Continue Dexilant , Pepcid , Carafate  in the meanwhile.  Discussed reflux precautions.  I think she also has rib cage pain.  Ice, heat, Tylenol .  Try to stretch the area.  Consider physical therapy. 3/4.  Adverse effect of chronic medication.  We will decrease her midodrine  from 10 mg 3 times daily to 5 mg 3 times daily.  She will cut the tablet in half and let me know when she needs refill.  Will send in the 5 mg tabs at that time.  Monitor blood pressure at home. F/u in 3 months Pt voiced understanding and agreement to the plan.  I spent 42 minutes with the patient discussing the above plans in addition to reviewing her chart at this time to the visit.  Mabel Mt  Abdulkareem Badolato, DO 08/23/2024 5:07 PM

## 2024-08-23 NOTE — Telephone Encounter (Addendum)
 MEDICAL PA SUBMITTED VIA LATENT. KEY: [id number]   APPROVED -

## 2024-08-23 NOTE — Patient Instructions (Addendum)
 The only lifestyle changes that have data behind them are weight loss for the overweight/obese and elevating the head of the bed. Finding out which foods/positions are triggers is important.  Let me know if there are cost issues.  Consider FDGard while we wait on the approval of this new medication.  Heat (pad or rice pillow in microwave) over affected area, 10-15 minutes twice daily.   Ice/cold pack over area for 10-15 min twice daily.  OK to take Tylenol  1000 mg (2 extra strength tabs) or 975 mg (3 regular strength tabs) every 6 hours as needed.  Let us  know if you need anything.

## 2024-08-24 ENCOUNTER — Telehealth: Payer: Self-pay | Admitting: *Deleted

## 2024-08-24 NOTE — Telephone Encounter (Signed)
 Tried calling pt to schedule pt for Prolia  and got no answer and no vm.  If pt calls back please transfer to office to discuss and schedule.

## 2024-08-24 NOTE — Telephone Encounter (Signed)
 Pt ready for scheduling for PROLIA  on or after : 09/06/24  Option# 1: Buy/Bill (Office supplied medication)  Out-of-pocket cost due at time of clinic visit: $377  Number of injection/visits approved: 2  Primary: [employer] Prolia  co-insurance: 20% Admin fee co-insurance: 20%  Secondary: --- Prolia  co-insurance:  Admin fee co-insurance:   Medical Benefit Details: Date Benefits were checked: 08/21/24 Deductible: NO/ Coinsurance: 20%/ Admin Fee: 20%  Prior Auth: APPROVED PA# [id number] Expiration Date: 08/23/24-08/16/25  # of doses approved: 2 ----------------------------------------------------------------------- Option# 2- Med Obtained from pharmacy:  Pharmacy benefit: Copay $64 (Paid to pharmacy) Admin Fee: 20% (Pay at clinic)  Prior Auth: N/A PA# Expiration Date:   # of doses approved:   If patient wants fill through the pharmacy benefit please send prescription to: [facility], and include estimated need by date in rx notes. Pharmacy will ship medication directly to the office.  Patient NOT eligible for Prolia  Copay Card. Copay Card can make patient's cost as little as $25. Link to apply: [email or web address]  ** This summary of benefits is an estimation of the patient's out-of-pocket cost. Exact cost may very based on individual plan coverage.

## 2024-08-25 ENCOUNTER — Other Ambulatory Visit: Payer: Self-pay

## 2024-08-25 ENCOUNTER — Telehealth: Payer: Self-pay

## 2024-08-25 NOTE — Telephone Encounter (Signed)
 Copied from CRM #[id number]. Topic: Clinical - Medication Question >> Aug 25, 2024  9:47 AM Marylynn H wrote: Reason for CRM: Patient states she was seen on 01/07 - did not get a chance to discuss updating RX for pregabalin  (LYRICA ) 50 MG capsule. Patient needs instructions to state take 4 by mouth daily. She has been running out before she can fill the RX - she states she does not take 4 every day only when her legs are acting up.   [facility] [facility] [address] [place], [place] - [address]

## 2024-08-27 NOTE — Progress Notes (Unsigned)
 "  Cardiology Office Note    Date:  08/27/2024  ID:  Megan Brewer, Megan Brewer 1946-02-25, MRN [id number] PCP:  [name], DO  Cardiologist:  Lynwood Schilling, MD  Electrophysiologist:  None   Chief Complaint: ***  History of Present Illness: .    Megan Brewer is a 79 y.o. female with visit-pertinent history of chest pain, palpitations, bacteremia, left bundle branch block, OSA, hypothyroidism, CKD stage IIIb, lung cancer s/p lobectomy and chemotherapy, orthostatic hypotension and obesity s/p gastric bypass.  Patient previously evaluated by Dr. Revankar in August 2020 after multiple visits to the ED for chest pain.  She underwent stress testing at that time which was normal and low risk.  She was seen by Dr. Schilling in 01/2021 for elevated troponin during hospital admission for sepsis.  She underwent echo for suspected bacteremia which showed normal LV/RV function and mild to moderate MR/TR/AI.  Elevated troponin thought to be related to demand ischemia in setting of sepsis and outpatient stress test was planned.  Upon follow-up she developed C. difficile and stress test was postponed.  In August 2022 she had an ED visit for weakness and collapse with associated chest pain.  Troponin negative and EKG without ischemic changes.  Nuclear stress testing in September 2022 was normal.  Patient was seen in the ED on 01/18/2023 for hypotension and was started on midodrine .  She was seen in office by Dr. Schilling on 02/09/2023 for follow-up, on arrival BP was 86/56 and she was sent to the ED for further evaluation.  She received fluid resuscitation with improvement in symptoms.  Upon follow-up with PCP the following day she was again hypotensive and sent back to the ED.  Florinef  increased to 0.2 mg daily.  Echocardiogram on 06/08/2023 indicated LVEF 60 to 65%, no RWMA, G1 DD, RV systolic function and size was normal, no evidence of mitral valve regurgitation or stenosis, aortic valve  regurgitation was not visualized, no stenosis was present.   Patient was last seen in clinic on 03/03/2024 by Dr. Schilling following again an ED visit, high presented with dehydration it was noted that while there her troponin is elevated at 319, they had initially sent her home without a troponin trend then called her back to get blood work drawn, redraw was 472 but she did not want to stay in the hospital.  At her follow-up visit she reported she had not been having any chest pressure, neck or arm discomfort.  She denied any shortness of breath it was noted that she was hypotensive and very weak when she came to the office but reported this was the way she always felt intermittently.  Her blood pressure improved during visit.  Note that she did have a urinary tract infection and had just been prescribed antibiotics.  Given elevated troponin nuclear stress test was ordered.  Stress test on 03/15/2024 was normal and low risk, LV perfusion was normal with no evidence of ischemia or infarction.  Coronary calcium  was absent on attenuation correction CT imaging.   Today she presents for follow up. She reports that she    Hypotension:  Mitral regurgitation:    Labwork independently reviewed:   ROS: .   *** denies chest pain, shortness of breath, lower extremity edema, fatigue, palpitations, melena, hematuria, hemoptysis, diaphoresis, weakness, presyncope, syncope, orthopnea, and PND.  All other systems are reviewed and otherwise negative.  Studies Reviewed: SABRA    EKG:  EKG is ordered today, personally reviewed, demonstrating ***  CV Studies: Cardiac studies reviewed are outlined and summarized above. Otherwise please see EMR for full report. Cardiac Studies & Procedures   ______________________________________________________________________________________________   STRESS TESTS  MYOCARDIAL PERFUSION IMAGING 03/15/2024  Interpretation Summary   The study is normal. The study is low risk.    LV perfusion is normal. There is no evidence of ischemia. There is no evidence of infarction.   Left ventricular function is normal. Nuclear stress EF: 92%. The left ventricular ejection fraction is hyperdynamic (>65%). End diastolic cavity size is normal. End systolic cavity size is normal.   Coronary calcium  was absent on the attenuation correction CT images.   Electronically signed by Lonni Nanas, MD   ECHOCARDIOGRAM  ECHOCARDIOGRAM COMPLETE 06/08/2023  Narrative ECHOCARDIOGRAM REPORT    Patient Name:   Megan Brewer Date of Exam: 06/08/2023 Medical Rec #:  [id number]          Height:       64.0 in Accession #:    [id number]         Weight:       143.0 lb Date of Birth:  1946/05/07         BSA:          1.696 m Patient Age:    76 years           BP:           87/55 mmHg Patient Gender: F                  HR:           81 bpm. Exam Location:  [facility]  Procedure: 2D Echo, Cardiac Doppler and Color Doppler  Indications:    R06.9 DOE  History:        Patient has prior history of Echocardiogram examinations, most recent 02/05/2021. Lung cancer, CKD, Arrythmias:LBBB, Signs/Symptoms:Dyspnea and Dizziness/Lightheadedness; Risk Factors:Sleep Apnea, Non-Smoker and Hypertension.  Sonographer:    Alan Greenhouse RDMS, RVT, RDCS Referring Phys: [id number] [facility]   Sonographer Comments: Suboptimal parasternal window and Technically difficult study due to poor echo windows. Image acquisition challenging due to patient body habitus. IMPRESSIONS   1. Left ventricular ejection fraction, by estimation, is 60 to 65%. The left ventricle has normal function. The left ventricle has no regional wall motion abnormalities. Left ventricular diastolic parameters are consistent with Grade I diastolic dysfunction (impaired relaxation). 2. Right ventricular systolic function is normal. The right ventricular size is normal. 3. The mitral valve is normal in structure. No evidence  of mitral valve regurgitation. No evidence of mitral stenosis. 4. The aortic valve is normal in structure. Aortic valve regurgitation is not visualized. No aortic stenosis is present. 5. The inferior vena cava is normal in size with greater than 50% respiratory variability, suggesting right atrial pressure of 3 mmHg.  Comparison(s): Echocardiogram done 02/05/21 showed an EF of 60-65%.  FINDINGS Left Ventricle: Left ventricular ejection fraction, by estimation, is 60 to 65%. The left ventricle has normal function. The left ventricle has no regional wall motion abnormalities. The left ventricular internal cavity size was normal in size. There is no left ventricular hypertrophy. Left ventricular diastolic parameters are consistent with Grade I diastolic dysfunction (impaired relaxation).  Right Ventricle: The right ventricular size is normal. No increase in right ventricular wall thickness. Right ventricular systolic function is normal.  Left Atrium: Left atrial size was normal in size.  Right Atrium: Right atrial size was normal in size.  Pericardium: There is no evidence  of pericardial effusion.  Mitral Valve: The mitral valve is normal in structure. No evidence of mitral valve regurgitation. No evidence of mitral valve stenosis.  Tricuspid Valve: The tricuspid valve is normal in structure. Tricuspid valve regurgitation is not demonstrated. No evidence of tricuspid stenosis.  Aortic Valve: The aortic valve is normal in structure. Aortic valve regurgitation is not visualized. No aortic stenosis is present. Aortic valve mean gradient measures 10.0 mmHg. Aortic valve peak gradient measures 14.5 mmHg. Aortic valve area, by VTI measures 2.17 cm.  Pulmonic Valve: The pulmonic valve was normal in structure. Pulmonic valve regurgitation is not visualized. No evidence of pulmonic stenosis.  Aorta: The aortic root is normal in size and structure.  Venous: The inferior vena cava is normal in size  with greater than 50% respiratory variability, suggesting right atrial pressure of 3 mmHg.  IAS/Shunts: No atrial level shunt detected by color flow Doppler.   LEFT VENTRICLE PLAX 2D LVIDd:         1.40 cm     Diastology LVIDs:         0.60 cm     LV e' medial:    7.07 cm/s LV PW:         1.00 cm     LV E/e' medial:  10.5 LV IVS:        0.50 cm     LV e' lateral:   7.07 cm/s LVOT diam:     1.70 cm     LV E/e' lateral: 10.5 LV SV:         80 LV SV Index:   47 LVOT Area:     2.27 cm  LV Volumes (MOD) LV vol d, MOD A2C: 22.7 ml LV vol d, MOD A4C: 17.4 ml LV vol s, MOD A2C: 10.3 ml LV vol s, MOD A4C: 5.9 ml LV SV MOD A2C:     12.4 ml LV SV MOD A4C:     17.4 ml LV SV MOD BP:      12.3 ml  RIGHT VENTRICLE RV S prime:     8.38 cm/s TAPSE (M-mode): 1.5 cm  LEFT ATRIUM             Index        RIGHT ATRIUM          Index LA diam:        2.10 cm 1.24 cm/m   RA Area:     4.93 cm LA Vol (A2C):   25.4 ml 14.97 ml/m  RA Volume:   5.81 ml  3.42 ml/m LA Vol (A4C):   25.3 ml 14.91 ml/m LA Biplane Vol: 25.9 ml 15.27 ml/m AORTIC VALVE AV Area (Vmax):    2.21 cm AV Area (Vmean):   1.99 cm AV Area (VTI):     2.17 cm AV Vmax:           190.67 cm/s AV Vmean:          153.000 cm/s AV VTI:            0.370 m AV Peak Grad:      14.5 mmHg AV Mean Grad:      10.0 mmHg LVOT Vmax:         186.00 cm/s LVOT Vmean:        134.000 cm/s LVOT VTI:          0.354 m LVOT/AV VTI ratio: 0.96  AORTA Ao Root diam: 2.30 cm  MITRAL VALVE  TRICUSPID VALVE MV Area (PHT): 2.68 cm     TR Peak grad:   16.3 mmHg MV Decel Time: 283 msec     TR Vmax:        202.00 cm/s MR Peak grad: 96.0 mmHg MR Vmax:      490.00 cm/s   SHUNTS MV E velocity: 74.10 cm/s   Systemic VTI:  0.35 m MV A velocity: 111.00 cm/s  Systemic Diam: 1.70 cm MV E/A ratio:  0.67  Jennifer Crape MD Electronically signed by Jennifer Crape MD Signature Date/Time: 06/09/2023/9:22:10 AM    Final           ______________________________________________________________________________________________       Current Reported Medications:.    Active Medications[1]  Physical Exam:    VS:  There were no vitals taken for this visit.   Wt Readings from Last 3 Encounters:  08/23/24 147 lb (66.7 kg)  08/22/24 146 lb (66.2 kg)  07/20/24 149 lb (67.6 kg)    GEN: Well nourished, well developed in no acute distress NECK: No JVD; No carotid bruits CARDIAC: ***RRR, no murmurs, rubs, gallops RESPIRATORY:  Clear to auscultation without rales, wheezing or rhonchi  ABDOMEN: Soft, non-tender, non-distended EXTREMITIES:  No edema; No acute deformity     Asessement and Plan:.     ***     Disposition: F/u with ***  Signed, Nayah Lukens D Trudi Morgenthaler, NP      [1]  No outpatient medications have been marked as taking for the 08/30/24 encounter (Appointment) with Lilleigh Hechavarria D, NP.   Current Facility-Administered Medications for the 08/30/24 encounter (Appointment) with Tyeasha Ebbs D, NP  Medication   [START ON 09/01/2024] denosumab  (PROLIA ) injection 60 mg   "

## 2024-08-28 ENCOUNTER — Other Ambulatory Visit: Payer: Self-pay

## 2024-08-28 ENCOUNTER — Other Ambulatory Visit (HOSPITAL_COMMUNITY): Payer: Self-pay

## 2024-08-28 NOTE — Progress Notes (Signed)
 Specialty Pharmacy Refill Coordination Note  Meekah Math is a 79 y.o. female contacted today regarding refills of specialty medication(s) Denosumab  (PROLIA )   Patient requested Courier to Provider Office   Delivery date: 09/05/24   Verified address: [facility]- [address]   Medication will be filled on: 09/04/24

## 2024-08-29 ENCOUNTER — Emergency Department (HOSPITAL_COMMUNITY)
Admission: EM | Admit: 2024-08-29 | Discharge: 2024-08-30 | Discharge status: Against Medical Advice | Attending: Emergency Medicine | Admitting: Emergency Medicine

## 2024-08-29 DIAGNOSIS — R300 Dysuria: Secondary | ICD-10-CM | POA: Diagnosis present

## 2024-08-29 DIAGNOSIS — R109 Unspecified abdominal pain: Secondary | ICD-10-CM | POA: Diagnosis present

## 2024-08-29 DIAGNOSIS — R110 Nausea: Secondary | ICD-10-CM | POA: Diagnosis not present

## 2024-08-29 DIAGNOSIS — R0602 Shortness of breath: Secondary | ICD-10-CM | POA: Diagnosis not present

## 2024-08-29 DIAGNOSIS — Z85118 Personal history of other malignant neoplasm of bronchus and lung: Secondary | ICD-10-CM | POA: Diagnosis not present

## 2024-08-29 DIAGNOSIS — Z79890 Hormone replacement therapy (postmenopausal): Secondary | ICD-10-CM | POA: Diagnosis not present

## 2024-08-29 DIAGNOSIS — Z5321 Procedure and treatment not carried out due to patient leaving prior to being seen by health care provider: Secondary | ICD-10-CM | POA: Diagnosis not present

## 2024-08-29 DIAGNOSIS — N3000 Acute cystitis without hematuria: Secondary | ICD-10-CM | POA: Diagnosis not present

## 2024-08-29 DIAGNOSIS — I129 Hypertensive chronic kidney disease with stage 1 through stage 4 chronic kidney disease, or unspecified chronic kidney disease: Secondary | ICD-10-CM | POA: Diagnosis not present

## 2024-08-29 DIAGNOSIS — Z9049 Acquired absence of other specified parts of digestive tract: Secondary | ICD-10-CM | POA: Insufficient documentation

## 2024-08-29 DIAGNOSIS — M791 Myalgia, unspecified site: Secondary | ICD-10-CM | POA: Diagnosis not present

## 2024-08-29 DIAGNOSIS — Z9884 Bariatric surgery status: Secondary | ICD-10-CM | POA: Diagnosis not present

## 2024-08-29 DIAGNOSIS — N189 Chronic kidney disease, unspecified: Secondary | ICD-10-CM | POA: Diagnosis not present

## 2024-08-29 DIAGNOSIS — R7989 Other specified abnormal findings of blood chemistry: Secondary | ICD-10-CM | POA: Diagnosis not present

## 2024-08-29 DIAGNOSIS — E039 Hypothyroidism, unspecified: Secondary | ICD-10-CM | POA: Diagnosis not present

## 2024-08-29 MED ORDER — OXYCODONE HCL 5 MG PO TABS
5.0000 mg | ORAL_TABLET | Freq: Once | ORAL | Status: AC
  Administered 2024-08-29: 5 mg via ORAL
  Filled 2024-08-29: qty 1

## 2024-08-29 MED ORDER — ONDANSETRON 8 MG PO TBDP
8.0000 mg | ORAL_TABLET | Freq: Once | ORAL | Status: AC
  Administered 2024-08-29: 8 mg via ORAL
  Filled 2024-08-29: qty 1

## 2024-08-29 NOTE — ED Triage Notes (Signed)
 Patient BIBA coming from home for possible UTI, c/o abdominal pain x 3 days. Denies fever/n/v. Patient a/o x 4. BP 156/74 HR 74 O2 98% RA.

## 2024-08-29 NOTE — ED Notes (Signed)
 Patient requesting port to be access for blood work

## 2024-08-29 NOTE — ED Notes (Signed)
 Pt requesting port access once in room for bloodwork and CT

## 2024-08-29 NOTE — ED Provider Triage Note (Signed)
 Emergency Medicine Provider Triage Evaluation Note  Megan Brewer , a 79 y.o. female  was evaluated in triage.  Pt complains of abdominal pain.  Also endorsing dysuria.  Feels that she may have a UTI.  Denies chest pain or shortness of breath.  Denies fever nausea vomiting.  Review of Systems  Positive: See above Negative: See above  Physical Exam  BP 125/73   Pulse 90   Temp 98.4 F (36.9 C) (Oral)   Resp 19   SpO2 99%  Gen:   Awake, no distress   Resp:  Normal effort  MSK:   Moves extremities without difficulty  Other:    Medical Decision Making  Medically screening exam initiated at 12:58 PM.  Appropriate orders placed.  Megan Brewer was informed that the remainder of the evaluation will be completed by another provider, this initial triage assessment does not replace that evaluation, and the importance of remaining in the ED until their evaluation is complete.  Work up started   Megan Norleen POUR, PA-C 08/29/24 1300

## 2024-08-30 ENCOUNTER — Other Ambulatory Visit: Payer: Self-pay

## 2024-08-30 ENCOUNTER — Encounter (HOSPITAL_BASED_OUTPATIENT_CLINIC_OR_DEPARTMENT_OTHER): Payer: Self-pay | Admitting: Emergency Medicine

## 2024-08-30 ENCOUNTER — Emergency Department (HOSPITAL_BASED_OUTPATIENT_CLINIC_OR_DEPARTMENT_OTHER)
Admission: EM | Admit: 2024-08-30 | Discharge: 2024-08-30 | Disposition: A | Discharge status: Home / Self Care | Attending: Emergency Medicine | Admitting: Emergency Medicine

## 2024-08-30 ENCOUNTER — Emergency Department (HOSPITAL_BASED_OUTPATIENT_CLINIC_OR_DEPARTMENT_OTHER)

## 2024-08-30 ENCOUNTER — Ambulatory Visit: Admitting: Cardiology

## 2024-08-30 DIAGNOSIS — N3000 Acute cystitis without hematuria: Secondary | ICD-10-CM | POA: Insufficient documentation

## 2024-08-30 DIAGNOSIS — R110 Nausea: Secondary | ICD-10-CM | POA: Insufficient documentation

## 2024-08-30 DIAGNOSIS — I951 Orthostatic hypotension: Secondary | ICD-10-CM

## 2024-08-30 DIAGNOSIS — M791 Myalgia, unspecified site: Secondary | ICD-10-CM | POA: Insufficient documentation

## 2024-08-30 DIAGNOSIS — N189 Chronic kidney disease, unspecified: Secondary | ICD-10-CM | POA: Insufficient documentation

## 2024-08-30 DIAGNOSIS — Z79890 Hormone replacement therapy (postmenopausal): Secondary | ICD-10-CM | POA: Insufficient documentation

## 2024-08-30 DIAGNOSIS — Z85118 Personal history of other malignant neoplasm of bronchus and lung: Secondary | ICD-10-CM | POA: Insufficient documentation

## 2024-08-30 DIAGNOSIS — I340 Nonrheumatic mitral (valve) insufficiency: Secondary | ICD-10-CM

## 2024-08-30 DIAGNOSIS — I129 Hypertensive chronic kidney disease with stage 1 through stage 4 chronic kidney disease, or unspecified chronic kidney disease: Secondary | ICD-10-CM | POA: Insufficient documentation

## 2024-08-30 DIAGNOSIS — R7989 Other specified abnormal findings of blood chemistry: Secondary | ICD-10-CM | POA: Insufficient documentation

## 2024-08-30 DIAGNOSIS — E039 Hypothyroidism, unspecified: Secondary | ICD-10-CM | POA: Insufficient documentation

## 2024-08-30 DIAGNOSIS — I959 Hypotension, unspecified: Secondary | ICD-10-CM

## 2024-08-30 LAB — CBC WITH DIFFERENTIAL/PLATELET
Abs Immature Granulocytes: 0.03 K/uL (ref 0.00–0.07)
Basophils Absolute: 0.1 K/uL (ref 0.0–0.1)
Basophils Relative: 1 %
Eosinophils Absolute: 0.1 K/uL (ref 0.0–0.5)
Eosinophils Relative: 2 %
HCT: 40.2 % (ref 36.0–46.0)
Hemoglobin: 13.4 g/dL (ref 12.0–15.0)
Immature Granulocytes: 1 %
Lymphocytes Relative: 28 %
Lymphs Abs: 1.8 K/uL (ref 0.7–4.0)
MCH: 30.4 pg (ref 26.0–34.0)
MCHC: 33.3 g/dL (ref 30.0–36.0)
MCV: 91.2 fL (ref 80.0–100.0)
Monocytes Absolute: 0.7 K/uL (ref 0.1–1.0)
Monocytes Relative: 12 %
Neutro Abs: 3.6 K/uL (ref 1.7–7.7)
Neutrophils Relative %: 56 %
Platelets: 153 K/uL (ref 150–400)
RBC: 4.41 MIL/uL (ref 3.87–5.11)
RDW: 13.2 % (ref 11.5–15.5)
WBC: 6.3 K/uL (ref 4.0–10.5)
nRBC: 0 % (ref 0.0–0.2)

## 2024-08-30 LAB — COMPREHENSIVE METABOLIC PANEL WITH GFR
ALT: 23 U/L (ref 0–44)
AST: 28 U/L (ref 15–41)
Albumin: 3.5 g/dL (ref 3.5–5.0)
Alkaline Phosphatase: 71 U/L (ref 38–126)
Anion gap: 12 (ref 5–15)
BUN: 32 mg/dL — ABNORMAL HIGH (ref 8–23)
CO2: 23 mmol/L (ref 22–32)
Calcium: 9.1 mg/dL (ref 8.9–10.3)
Chloride: 105 mmol/L (ref 98–111)
Creatinine, Ser: 1.24 mg/dL — ABNORMAL HIGH (ref 0.44–1.00)
GFR, Estimated: 45 mL/min — ABNORMAL LOW
Glucose, Bld: 105 mg/dL — ABNORMAL HIGH (ref 70–99)
Potassium: 4 mmol/L (ref 3.5–5.1)
Sodium: 140 mmol/L (ref 135–145)
Total Bilirubin: 0.3 mg/dL (ref 0.0–1.2)
Total Protein: 5.7 g/dL — ABNORMAL LOW (ref 6.5–8.1)

## 2024-08-30 LAB — URINALYSIS, MICROSCOPIC (REFLEX): RBC / HPF: NONE SEEN RBC/hpf (ref 0–5)

## 2024-08-30 LAB — RESP PANEL BY RT-PCR (RSV, FLU A&B, COVID)  RVPGX2
Influenza A by PCR: NEGATIVE
Influenza B by PCR: NEGATIVE
Resp Syncytial Virus by PCR: NEGATIVE
SARS Coronavirus 2 by RT PCR: NEGATIVE

## 2024-08-30 LAB — URINALYSIS, ROUTINE W REFLEX MICROSCOPIC
Bilirubin Urine: NEGATIVE
Glucose, UA: NEGATIVE mg/dL
Hgb urine dipstick: NEGATIVE
Ketones, ur: NEGATIVE mg/dL
Nitrite: NEGATIVE
Protein, ur: NEGATIVE mg/dL
Specific Gravity, Urine: 1.01 (ref 1.005–1.030)
pH: 5.5 (ref 5.0–8.0)

## 2024-08-30 LAB — LIPASE, BLOOD: Lipase: 41 U/L (ref 11–51)

## 2024-08-30 LAB — TROPONIN T, HIGH SENSITIVITY: Troponin T High Sensitivity: 18 ng/L (ref 0–19)

## 2024-08-30 MED ORDER — IOHEXOL 300 MG/ML  SOLN
100.0000 mL | Freq: Once | INTRAMUSCULAR | Status: AC | PRN
  Administered 2024-08-30: 80 mL via INTRAVENOUS

## 2024-08-30 MED ORDER — LIDOCAINE 5 % EX PTCH
2.0000 | MEDICATED_PATCH | CUTANEOUS | Status: DC
  Administered 2024-08-30: 2 via TRANSDERMAL
  Filled 2024-08-30: qty 2

## 2024-08-30 MED ORDER — FOSFOMYCIN TROMETHAMINE 3 G PO PACK
3.0000 g | PACK | Freq: Once | ORAL | Status: AC
  Administered 2024-08-30: 3 g via ORAL
  Filled 2024-08-30: qty 3

## 2024-08-30 MED ORDER — SODIUM CHLORIDE 0.9 % IV BOLUS
1000.0000 mL | Freq: Once | INTRAVENOUS | Status: AC
  Administered 2024-08-30: 1000 mL via INTRAVENOUS

## 2024-08-30 MED ORDER — ONDANSETRON HCL 4 MG/2ML IJ SOLN
4.0000 mg | Freq: Once | INTRAMUSCULAR | Status: AC
  Administered 2024-08-30: 4 mg via INTRAVENOUS
  Filled 2024-08-30: qty 2

## 2024-08-30 MED ORDER — MORPHINE SULFATE (PF) 2 MG/ML IV SOLN
2.0000 mg | Freq: Once | INTRAVENOUS | Status: AC
  Administered 2024-08-30: 2 mg via INTRAVENOUS
  Filled 2024-08-30: qty 1

## 2024-08-30 MED ORDER — HEPARIN SOD (PORK) LOCK FLUSH 100 UNIT/ML IV SOLN
INTRAVENOUS | Status: AC
  Administered 2024-08-30: 500 [IU]
  Filled 2024-08-30: qty 5

## 2024-08-30 MED ORDER — SODIUM CHLORIDE 0.9 % IV SOLN: 1.0000 g | Freq: Once | INTRAVENOUS | Status: DC

## 2024-08-30 MED ORDER — ONDANSETRON 4 MG PO TBDP: 4.0000 mg | ORAL_TABLET | Freq: Three times a day (TID) | ORAL | 0 refills | Status: AC | PRN

## 2024-08-30 NOTE — ED Notes (Addendum)
 Pt obtaining another urine sample. PT can stand and pivot without incident or assistance, rolled in wheelchair for safety by husband.

## 2024-08-30 NOTE — ED Notes (Signed)
 Pt came in for generalized pain and weakness, with a hx of being immunocompromised. Has a portacath for home infusions without incident, preferred for it to be accessed. The pt spent 12 hours in the ER waiting room at [facility] and left and returned today for the same complaint. Concerned for UTI worsening. CAOx4, husband at bedside.

## 2024-08-30 NOTE — ED Triage Notes (Signed)
"   States went to [facility] for dysuria and was there a long time and   states had s/s of UTI  x 2 weeks got worse yesterday  states a test strip  was positive "

## 2024-08-30 NOTE — Discharge Instructions (Addendum)
 It was a pleasure taking care of you today.  As discussed, your urine showed a possible UTI.  You were given a dose of antibiotics here in the ER.  I am sending you home with Zofran  as needed for nausea.  Please follow-up with PCP this week for recheck.  Return to the ER for any worsening symptoms.

## 2024-08-30 NOTE — ED Provider Notes (Signed)
 " [place] [facility] Provider Note   CSN: [id number] Arrival date & time: 08/30/24  9096     Patient presents with: Dysuria   Megan Brewer is a 79 y.o. female with a past medical history significant for PMR with injections every 6 weeks, remote history of lung cancer in remission, CKD, GERD, hypothyroidism who presents to the ED due to flulike symptoms for the past 2 weeks.  Patient admits to severe body aches, malaise, and nausea.  Also admits to decreased appetite over the past few days.  No emesis or diarrhea.  Notes she developed UTI symptoms yesterday with dysuria.  No hematuria.  Denies back and flank pain. Has frequent UTIs. Admits to some abdominal pain.  Has had a gastric bypass, cholecystectomy and abdominal hysterectomy.  Husband at bedside is not currently sick.  Admits to taking Goody powder every other day once a day.  Admits to dark stool which she attributes to her iron supplements.   History obtained from patient and past medical records. No interpreter used during encounter.       Prior to Admission medications  Medication Sig Start Date End Date Taking? Authorizing Provider  ondansetron  (ZOFRAN -ODT) 4 MG disintegrating tablet Take 1 tablet (4 mg total) by mouth every 8 (eight) hours as needed for nausea or vomiting. 08/30/24  Yes Dhanya Bogle, Aleck BROCKS, PA-C  CALCIUM  PO Take 1 tablet by mouth in the morning and at bedtime.    [provider]  Chlorpheniramine-PSE-Ibuprofen  (ADVIL  ALLERGY SINUS PO) Take 1 tablet by mouth daily.    [provider]  cycloSPORINE  (RESTASIS ) 0.05 % ophthalmic emulsion Place 1 drop into both eyes 4 (four) times daily as needed.    [provider]  denosumab  (PROLIA ) 60 MG/ML SOSY injection Pt to get at office.  Appt is 08/27/23 07/14/23   [name], DO  dexlansoprazole  (DEXILANT ) 60 MG capsule TAKE 1 CAPSULE BY MOUTH EVERY DAY 08/07/24   Collier, Amanda R, PA-C   diclofenac  Sodium (VOLTAREN ) 1 % GEL Apply 2 g topically 4 (four) times daily. Patient taking differently: Apply 2 g topically 4 (four) times daily as needed. 07/27/23   [name], PA-C  famotidine  (PEPCID ) 20 MG tablet Take 1 tablet (20 mg total) by mouth 2 (two) times daily. 09/20/23   [name], PA-C  ferrous sulfate  325 (65 FE) MG tablet Take 650 mg by mouth daily with breakfast.    [provider]  fludrocortisone  (FLORINEF ) 0.1 MG tablet TAKE 1 TABLET BY MOUTH TWICE DAILY 06/15/24   Wendling, Mabel Mt, DO  GAMMAGARD 20 GM/200ML SOLN Inject 40 g into the vein every 21 ( twenty-one) days. INFUSE 40G INTRAVENOUSLY EVERY 3 WEEKS 02/27/22   [provider]  KEVZARA 200 MG/1. SOAJ Inject 1.14 mLs into the skin every 14 (fourteen) days. 09/09/22   [provider]  levothyroxine  (SYNTHROID ) 75 MCG tablet TAKE ONE (1) TABLET BY MOUTH EACH DAY BEFORE BREAKFAST ON EMPTY STOMACH 04/21/24   Wendling, Mabel Mt, DO  lidocaine  (LIDODERM ) 5 % Place 1 patch onto the skin daily. Remove & Discard patch within 12 hours or as directed by MD 08/22/24   [name], [name] SQUIBB, MD  magnesium  oxide (MAG-OX) 400 MG tablet Take 400 mg by mouth daily.     [provider]  methylphenidate  (RITALIN ) 10 MG tablet Take 1 tablet (10 mg total) by mouth daily as needed. 05/22/24   [name], [name] SQUIBB, MD  midodrine  (PROAMATINE )  10 MG tablet Take 0.5 tablets (5 mg total) by mouth 3 (three) times daily. 08/23/24   [name], DO  MYRBETRIQ  50 MG TB24 tablet Take 50 mg by mouth every evening. 10/07/22   [provider]  naloxone  (NARCAN ) nasal spray 4 mg/0.1 mL Spray up nostril in event of opioid overdose. 06/28/23   [name], DO  oxyCODONE  HCl 10 MG TABA Take 0.5-1 tablets by mouth every 6 (six) hours as needed (Severe pain). 06/05/24   [name], DO  potassium chloride  (KLOR-CON ) 10 MEQ tablet Take 1 tablet (10 mEq total) by  mouth in the morning, at noon, in the evening, and at bedtime. 02/15/24   [name], DO  pregabalin  (LYRICA ) 50 MG capsule Take 1 capsule (50 mg total) by mouth 3 (three) times daily. 05/29/24   [name], DO  promethazine  (PHENERGAN ) 12.5 MG tablet Take 1 tablet (12.5 mg total) by mouth every 6 (six) hours as needed for nausea or vomiting. 01/03/24   Frann, Mabel Mt, DO  Rimegepant Sulfate (NURTEC) 75 MG TBDP Dissolve 1 tab under the tongue daily as needed for migraines. Repeat in 2 hours if no improvement. 01/03/24   [name], DO  sucralfate  (CARAFATE ) 1 GM/10ML suspension TAKE 10 MLS (1 GIVE TOTAL) BY MOUTH 4 TIMES DAILY AS NEEDED 08/21/24   [name], DO  Suzetrigine  (JOURNAVX ) 50 MG TABS Take 1 tablet by mouth 2 (two) times daily as needed. 06/08/24   [name], [name] SQUIBB, MD  tamsulosin  (FLOMAX ) 0.4 MG CAPS capsule Take 0.4 mg by mouth daily. 11/25/23   [provider]  torsemide  (DEMADEX ) 20 MG tablet Take 1 tablet (20 mg total) by mouth 2 (two) times daily. 07/21/24   [name], DO  triamcinolone  cream (KENALOG ) 0.1 % Apply 1 application  topically 2 (two) times daily as needed (rash).    [provider]  TRULANCE  3 MG TABS TAKE 1 TABLET BY MOUTH DAILY 07/20/24   Collier, Amanda R, PA-C  Vonoprazan Fumarate  (VOQUEZNA ) 10 MG TABS Take 10 mg by mouth daily. 08/23/24   [name], DO  ZINC  OXIDE, TOPICAL, 10 % CREA Apply 1 application topically 2 (two) times daily as needed (rash). 02/14/21   [name], MD    Allergies: Butorphanol, Erythromycin base, Propoxyphene, Sumatriptan , Tetracycline, Ciprofloxacin, Corticosteroids, Doxycycline , Cefixime, Isometheptene-dichloral-apap, Ketorolac , Prednisone , Promethazine , and Tape    Review of Systems  Constitutional:  Positive for chills and fatigue. Negative for fever.  Respiratory:  Positive for shortness of breath.   Cardiovascular:  Negative for  chest pain.  Gastrointestinal:  Positive for abdominal pain and nausea. Negative for diarrhea and vomiting.  Genitourinary:  Positive for dysuria.    Updated Vital Signs BP (!) 107/42 (BP Location: Left Arm)   Pulse 77   Temp 97.7 F (36.5 C) (Oral)   Resp 16   Ht 5' 4 (1.626 m)   Wt 66.7 kg   SpO2 100%   BMI 25.24 kg/m   Physical Exam Vitals and nursing note reviewed.  Constitutional:      General: She is not in acute distress.    Appearance: She is not ill-appearing.  HENT:     Head: Normocephalic.  Eyes:     Pupils: Pupils are equal, round, and reactive to light.  Cardiovascular:     Rate and Rhythm: Normal rate and regular rhythm.     Pulses: Normal pulses.     Heart sounds: Normal heart sounds.  No murmur heard.    No friction rub. No gallop.  Pulmonary:     Effort: Pulmonary effort is normal.     Breath sounds: Normal breath sounds.  Abdominal:     General: Abdomen is flat. There is no distension.     Palpations: Abdomen is soft.     Tenderness: There is abdominal tenderness. There is no guarding or rebound.     Comments: Diffuse tenderness without rebound or guarding  Musculoskeletal:        General: Normal range of motion.     Cervical back: Neck supple.  Skin:    General: Skin is warm and dry.  Neurological:     General: No focal deficit present.     Mental Status: She is alert.  Psychiatric:        Mood and Affect: Mood normal.        Behavior: Behavior normal.     (all labs ordered are listed, but only abnormal results are displayed) Labs Reviewed  COMPREHENSIVE METABOLIC PANEL WITH GFR - Abnormal; Notable for the following components:      Result Value   Glucose, Bld 105 (*)    BUN 32 (*)    Creatinine, Ser 1.24 (*)    Total Protein 5.7 (*)    GFR, Estimated 45 (*)    All other components within normal limits  URINALYSIS, ROUTINE W REFLEX MICROSCOPIC - Abnormal; Notable for the following components:   APPearance HAZY (*)    Leukocytes,Ua  TRACE (*)    All other components within normal limits  URINALYSIS, MICROSCOPIC (REFLEX) - Abnormal; Notable for the following components:   Bacteria, UA MANY (*)    All other components within normal limits  RESP PANEL BY RT-PCR (RSV, FLU A&B, COVID)  [id number]  URINE CULTURE  CBC WITH DIFFERENTIAL/PLATELET  LIPASE, BLOOD  TROPONIN T, HIGH SENSITIVITY    EKG: EKG Interpretation Date/Time:  Wednesday August 30 2024 16:07:20 EST Ventricular Rate:  70 PR Interval:  162 QRS Duration:  142 QT Interval:  458 QTC Calculation: 495 R Axis:   26  Text Interpretation: Sinus rhythm Left bundle branch block Confirmed by Yolande Charleston [phone]) on 08/30/2024 4:41:17 PM  Radiology: CT ABDOMEN PELVIS W CONTRAST Result Date: 08/30/2024 CLINICAL DATA:  Abdominal pain. EXAM: CT ABDOMEN AND PELVIS WITH CONTRAST TECHNIQUE: Multidetector CT imaging of the abdomen and pelvis was performed using the standard protocol following bolus administration of intravenous contrast. RADIATION DOSE REDUCTION: This exam was performed according to the departmental dose-optimization program which includes automated exposure control, adjustment of the mA and/or kV according to patient size and/or use of iterative reconstruction technique. CONTRAST:  80mL OMNIPAQUE  IOHEXOL  300 MG/ML  SOLN COMPARISON:  CT dated 05/11/2024. FINDINGS: Lower chest: No acute findings. Chronic right lung base pleural thickening and scarring. No intra-abdominal free air or free fluid. Hepatobiliary: The liver is unremarkable. There is biliary dilatation, post cholecystectomy. The common bile duct measures approximately 12 mm in diameter. No retained calcified stone noted in the central CBD. Pancreas: Unremarkable. No pancreatic ductal dilatation or surrounding inflammatory changes. Spleen: Normal in size without focal abnormality. Adrenals/Urinary Tract: The adrenal glands unremarkable. There is no hydronephrosis on either side. There is symmetric  enhancement and excretion of contrast by both kidneys. The visualized ureters and urinary bladder appear unremarkable. Stomach/Bowel: Postsurgical changes of gastric bypass. There is no bowel obstruction or active inflammation. The appendix is normal. Vascular/Lymphatic: Mild aortoiliac atherosclerotic disease. The IVC is unremarkable. No portal venous gas.  There is no adenopathy. Reproductive: Hysterectomy.  No suspicious adnexal masses. Other: None Musculoskeletal: Osteopenia with degenerative changes of the spine. No acute osseous pathology. Stimulator battery packs in the subcutaneous soft tissues of the lower back. IMPRESSION: 1. No acute intra-abdominal or pelvic pathology. 2. Postsurgical changes of gastric bypass. No bowel obstruction. Normal appendix. 3.  Aortic Atherosclerosis (ICD10-I70.0). Electronically Signed   By: Vanetta Chou M.D.   On: 08/30/2024 13:37   DG Chest Portable 1 View Result Date: 08/30/2024 CLINICAL DATA:  Fever.  Dysuria. EXAM: PORTABLE CHEST - 1 VIEW COMPARISON:  03/07/2024 FINDINGS: Cardiomediastinal silhouette and pulmonary vasculature are within normal limits. Postsurgical changes of the RIGHT posterior mid chest wall again seen. Lungs are clear. RIGHT IJ chest port is unchanged in position. Neurostimulator leads are also unchanged in position. IMPRESSION: No acute cardiopulmonary process. Electronically Signed   By: Aliene Lloyd M.D.   On: 08/30/2024 12:01     Procedures   Medications Ordered in the ED  lidocaine  (LIDODERM ) 5 % 2 patch (2 patches Transdermal Patch Applied 08/30/24 1315)  sodium chloride  0.9 % bolus 1,000 mL (0 mLs Intravenous Stopped 08/30/24 1226)  ondansetron  (ZOFRAN ) injection 4 mg (4 mg Intravenous Given 08/30/24 1124)  morphine  (PF) 2 MG/ML injection 2 mg (2 mg Intravenous Given 08/30/24 1124)  iohexol  (OMNIPAQUE ) 300 MG/ML solution 100 mL (80 mLs Intravenous Contrast Given 08/30/24 1239)  fosfomycin  (MONUROL ) packet 3 g (3 g Oral Given 08/30/24  1557)                                    Medical Decision Making Amount and/or Complexity of Data Reviewed Independent Historian: spouse External Data Reviewed: notes.    Details: PCP note 1/7 Labs: ordered. Decision-making details documented in ED Course. Radiology: ordered and independent interpretation performed. Decision-making details documented in ED Course. ECG/medicine tests: ordered and independent interpretation performed. Decision-making details documented in ED Course.  Risk Prescription drug management.   This patient presents to the ED for concern of flu-like symptoms, this involves an extensive number of treatment options, and is a complaint that carries with it a high risk of complications and morbidity.  The differential diagnosis includes viral process, pneumonia, UTI, dehydration, electrolyte abnormality, etc  79 year old female presents to the ED due to flulike symptoms x 2 weeks.  Also admits to dysuria that started yesterday.  History of frequent UTIs.  Has a history of PMR and gets injections every 6 weeks and is due for an injection tomorrow.  Upon arrival, vitals all within normal limits.  Patient in no acute distress.  Abdomen soft, nondistended with diffuse tenderness without rebound or guarding.  Labs ordered.  Chest x-ray to rule out evidence of pneumonia.  RVP to rule out viral infection.  Patient has had decreased p.o. intake over the past few days.  Does have dry mucous membranes.  IV fluids and Zofran  given.  CBC with no leukocytosis.  Normal hemoglobin.  CMP with elevated creatinine at 1.2 and BUN at 32.  Patient given 1 L IV fluids.  Normal LFTs.  No major electrolyte derangements.  Lipase normal.  Low suspicion for pancreatitis.  UA with trace leukocytes and many bacteria.  Only has 0-4 white blood cells however, given patient is having urinary symptoms and gets frequent UTIs we will treat for acute cystitis.  Patient has multiple allergies.  Notes she  typically gets fosfomycin .  She also  notes she typically gets Flagyl  to prevent C. difficile while on any antibiotics however, notes that she gets fosfomycin  she does not need any Flagyl  given it is just 1 dose.  RVP negative.  Troponin normal.  Chest x-ray personally viewed interpreted which is negative for signs of pneumonia.  CT abdomen negative for any acute abnormalities.  Does demonstrate postsurgical changes of gastric bypass.  No evidence of bowel obstruction or appendicitis.  Reassessed patient.  Patient notes some improvement in symptoms after IV fluids.  Discussed results and plans for discharge home after dose of fosfomycin  for acute cystitis were given.  Patient agreeable to plan.  She notes she feels her symptoms are able to be managed at home.  Will discharge patient with Zofran  as needed for nausea.  Advised patient to follow-up with PCP this week for a recheck or return for worsening symptoms.  Discussed with Dr. Elnor who agrees with assessment and plan.   Co morbidities that complicate the patient evaluation  PMR, HTN, CKD Cardiac Monitoring: / EKG:  The patient was maintained on a cardiac monitor.  I personally viewed and interpreted the cardiac monitored which showed an underlying rhythm of: NSR, HR 70, LBBB  Social Determinants of Health:  Elderly >65  Test / Admission - Considered:  Shared decision making in regards to admission vs. Discharge home and patient prefers to go home. Patient given dose of fosfomycin  here in the ED for possible acute cystitis.  Patient able to tolerate p.o. at bedside.  Labs overall reassuring.  Patient stable for discharge.      Final diagnoses:  Acute cystitis without hematuria  Nausea  Myalgia    ED Discharge Orders          Ordered    ondansetron  (ZOFRAN -ODT) 4 MG disintegrating tablet  Every 8 hours PRN        08/30/24 1640               Inola Lisle C, PA-C 08/30/24 1643    Elnor Jayson LABOR, DO 08/31/24  [phone]  "

## 2024-08-30 NOTE — ED Notes (Signed)

## 2024-08-30 NOTE — ED Notes (Signed)
 I Dc'd the patient's port while talking with her about the discharge and forgot to flush heparin . I had to reaccess her port again, utilizing the same P20, and heparin  locked the site. Blood return noted beforehand, no issue. Pt was then discharged properly afterwards.

## 2024-08-31 ENCOUNTER — Ambulatory Visit

## 2024-08-31 ENCOUNTER — Encounter: Admitting: Physician Assistant

## 2024-09-01 LAB — URINE CULTURE: Culture: >=100000 — AB

## 2024-09-02 ENCOUNTER — Telehealth (HOSPITAL_BASED_OUTPATIENT_CLINIC_OR_DEPARTMENT_OTHER): Payer: Self-pay | Admitting: *Deleted

## 2024-09-02 NOTE — Telephone Encounter (Signed)
 Post ED Visit - Positive Culture Follow-up  Culture report reviewed by antimicrobial stewardship pharmacist: [facility] []  Rankin Dee, Pharm.D. []  Venetia Gully, Pharm.D., BCPS AQ-ID []  Garrel Crews, Pharm.D., BCPS []  Almarie Lunger, Pharm.D., BCPS []  [place], [place].D., BCPS, AAHIVP []  Rosaline Bihari, Pharm.D., BCPS, AAHIVP []  Vernell Meier, PharmD, BCPS []  Latanya Hint, PharmD, BCPS []  Donald Medley, PharmD, BCPS []  Rocky Bold, PharmD []  Dorothyann Alert, PharmD, BCPS [x]  Dorn Buttner, PharmD  [facility] []  Rosaline Edison, PharmD []  Romona Bliss, PharmD []  Dolphus Roller, PharmD []  Veva Seip, Rph []  Vernell Daunt) Leonce, PharmD []  Eva Allis, PharmD []  Rosaline Millet, PharmD []  Iantha Batch, PharmD []  Arvin Gauss, PharmD []  Wanda Hasting, PharmD []  Ronal Rav, PharmD []  Rocky Slade, PharmD []  Bard Jeans, PharmD   Positive urine culture Treated with Fosfomycin , organism sensitive to the same and no further patient follow-up is required at this time.  Megan Brewer 09/02/2024, 2:20 PM

## 2024-09-04 ENCOUNTER — Other Ambulatory Visit: Payer: Self-pay

## 2024-09-06 ENCOUNTER — Telehealth: Payer: Self-pay | Admitting: Pharmacy Technician

## 2024-09-06 NOTE — Telephone Encounter (Signed)
 Auth Submission: NO AUTH NEEDED Site of care: Site of care: [facility] Payer: [employer] MEDICARE Medication & CPT/J Code(s) submitted: Solumedrol (Methylprednisolone ) [id number] Diagnosis Code: M25.50 Route of submission (phone, fax, portal):  Phone # Fax # Auth type: Buy/Bill PB Units/visits requested: Q42 DAYS Reference number:  Approval from: 09/06/24 to 08/16/25

## 2024-09-12 NOTE — Progress Notes (Unsigned)
" ° °  Acute Office Visit  Subjective:     Patient ID: Korie Streat, female    DOB: [birth year], 79 y.o.   MRN: [id number]  No chief complaint on file.   HPI Patient is in today for hospital follow up for acute cystitis without hematuria. Pt presented to ED with flu-like symptoms x 2 weeks, dysuria, and decreased PO intake. She was Tx with  1L IV fluids and fosfomycin  in ED.  Urine culture 08/30/2024: + E. Coli >=100,0000  PMHx-PMR with injections every 6 weeks, remote history of lung cancer in remission, CKD, GERD, hypothyroidism, frequent UTI, Hx gastric bypass Sx  Patient denies fever, chills, SOB, CP, palpitations, dyspnea, edema, HA, vision changes, N/V/D, abdominal pain, urinary symptoms, rash, weight changes, and recent illness or hospitalizations.   ROS      Objective:    There were no vitals taken for this visit. {Vitals History (Optional):23777}  Physical Exam  No results found for any visits on 09/14/24.      Assessment & Plan:   Problem List Items Addressed This Visit   None   No orders of the defined types were placed in this encounter.   No follow-ups on file.  Harlene LITTIE Jolly, NP   "

## 2024-09-14 ENCOUNTER — Ambulatory Visit: Admitting: Student

## 2024-09-14 DIAGNOSIS — N390 Urinary tract infection, site not specified: Secondary | ICD-10-CM

## 2024-09-15 ENCOUNTER — Ambulatory Visit

## 2024-09-21 ENCOUNTER — Ambulatory Visit

## 2024-09-21 VITALS — BP 114/58 | HR 70 | Temp 97.5°F | Resp 20 | Ht 64.0 in | Wt 149.0 lb

## 2024-09-21 DIAGNOSIS — M353 Polymyalgia rheumatica: Secondary | ICD-10-CM | POA: Diagnosis not present

## 2024-09-21 DIAGNOSIS — M2550 Pain in unspecified joint: Secondary | ICD-10-CM

## 2024-09-21 MED ORDER — SODIUM CHLORIDE 0.9 % IV SOLN
1000.0000 mg | Freq: Once | INTRAVENOUS | Status: AC
  Administered 2024-09-21: 1000 mg via INTRAVENOUS
  Filled 2024-09-21: qty 16

## 2024-09-21 MED ORDER — HEPARIN SOD (PORK) LOCK FLUSH 100 UNIT/ML IV SOLN
500.0000 [IU] | Freq: Once | INTRAVENOUS | Status: AC | PRN
  Administered 2024-09-21: 500 [IU]
  Filled 2024-09-21: qty 5

## 2024-09-21 NOTE — Progress Notes (Signed)
 Diagnosis: Polymyalgia  Provider:  Lonna Coder MD  Procedure: IV Infusion  IV Type: Port a Cath, IV Location: R Chest  Solumedrol (Methylprednisolone ), Dose: 1000 mg  Infusion Start Time: 1107  Infusion Stop Time: 1204  Post Infusion IV Care: Port a Cath Deaccessed/Flushed  Discharge: Condition: Good, Destination: Home . AVS Declined  Performed by:  Napoleon Monacelli, RN

## 2024-09-22 ENCOUNTER — Telehealth: Payer: Self-pay | Admitting: *Deleted

## 2024-09-22 NOTE — Telephone Encounter (Signed)
 Spoke with patient to schedule prolia  and she stated that she never picked up the medication that was sent in last month for her stomach.  The medication was for Voquezna .  Advised pt that I would call pharmacy.  Called pharmacy and they stated that they did have it ready but it was put back on 1/28.  They did try and run it back through but was unable to and could not tell me price.  They will also have to order it for Monday or Tuesday.  Called pt back and notified her of this.

## 2024-09-26 ENCOUNTER — Ambulatory Visit: Admitting: Physician Assistant

## 2024-09-27 ENCOUNTER — Ambulatory Visit

## 2024-11-02 ENCOUNTER — Ambulatory Visit

## 2024-11-21 ENCOUNTER — Encounter: Admitting: Physical Medicine and Rehabilitation

## 2025-03-27 ENCOUNTER — Ambulatory Visit
